# Patient Record
Sex: Female | Born: 1955
Health system: Southern US, Community
[De-identification: ages and names within clinical notes are randomized; demographics above are authoritative.]

## PROBLEM LIST (undated history)

## (undated) DIAGNOSIS — E785 Hyperlipidemia, unspecified: Secondary | ICD-10-CM

## (undated) DIAGNOSIS — M1990 Unspecified osteoarthritis, unspecified site: Secondary | ICD-10-CM

## (undated) DIAGNOSIS — I10 Essential (primary) hypertension: Secondary | ICD-10-CM

## (undated) DIAGNOSIS — K219 Gastro-esophageal reflux disease without esophagitis: Secondary | ICD-10-CM

## (undated) DIAGNOSIS — M545 Low back pain, unspecified: Secondary | ICD-10-CM

## (undated) DIAGNOSIS — G8929 Other chronic pain: Secondary | ICD-10-CM

## (undated) DIAGNOSIS — I341 Nonrheumatic mitral (valve) prolapse: Secondary | ICD-10-CM

## (undated) DIAGNOSIS — R1319 Other dysphagia: Secondary | ICD-10-CM

## (undated) DIAGNOSIS — F10239 Alcohol dependence with withdrawal, unspecified: Secondary | ICD-10-CM

## (undated) DIAGNOSIS — K700 Alcoholic fatty liver: Secondary | ICD-10-CM

## (undated) DIAGNOSIS — E042 Nontoxic multinodular goiter: Secondary | ICD-10-CM

## (undated) DIAGNOSIS — F419 Anxiety disorder, unspecified: Secondary | ICD-10-CM

## (undated) DIAGNOSIS — I471 Supraventricular tachycardia, unspecified: Secondary | ICD-10-CM

## (undated) DIAGNOSIS — G43909 Migraine, unspecified, not intractable, without status migrainosus: Secondary | ICD-10-CM

## (undated) DIAGNOSIS — R112 Nausea with vomiting, unspecified: Secondary | ICD-10-CM

## (undated) DIAGNOSIS — F32A Depression, unspecified: Secondary | ICD-10-CM

## (undated) DIAGNOSIS — F1020 Alcohol dependence, uncomplicated: Secondary | ICD-10-CM

## (undated) DIAGNOSIS — Z780 Asymptomatic menopausal state: Secondary | ICD-10-CM

## (undated) DIAGNOSIS — M810 Age-related osteoporosis without current pathological fracture: Secondary | ICD-10-CM

## (undated) DIAGNOSIS — Z9889 Other specified postprocedural states: Secondary | ICD-10-CM

## (undated) DIAGNOSIS — F329 Major depressive disorder, single episode, unspecified: Secondary | ICD-10-CM

## (undated) HISTORY — DX: Supraventricular tachycardia, unspecified: I47.10

## (undated) HISTORY — DX: Hyperlipidemia, unspecified: E78.5

## (undated) HISTORY — DX: Asymptomatic menopausal state: Z78.0

## (undated) HISTORY — PX: BREAST BIOPSY: SHX20

## (undated) HISTORY — PX: OTHER SURGICAL HISTORY: SHX169

## (undated) HISTORY — PX: TONSILLECTOMY: SUR1361

## (undated) HISTORY — DX: Supraventricular tachycardia: I47.1

## (undated) HISTORY — DX: Anxiety disorder, unspecified: F41.9

## (undated) HISTORY — DX: Other dysphagia: R13.19

## (undated) HISTORY — PX: CATARACT EXTRACTION W/ INTRAOCULAR LENS  IMPLANT, BILATERAL: SHX1307

## (undated) HISTORY — DX: Migraine, unspecified, not intractable, without status migrainosus: G43.909

## (undated) HISTORY — PX: CERVICAL CONE BIOPSY: SUR198

## (undated) HISTORY — DX: Nontoxic multinodular goiter: E04.2

## (undated) HISTORY — DX: Age-related osteoporosis without current pathological fracture: M81.0

---

## 1989-02-28 HISTORY — PX: CERVIX LESION DESTRUCTION: SHX591

## 2005-03-11 ENCOUNTER — Emergency Department (HOSPITAL_COMMUNITY): Admission: EM | Admit: 2005-03-11 | Discharge: 2005-03-11 | Payer: Self-pay | Admitting: *Deleted

## 2005-05-11 ENCOUNTER — Encounter: Admission: RE | Admit: 2005-05-11 | Discharge: 2005-05-11 | Payer: Self-pay | Admitting: *Deleted

## 2005-05-28 ENCOUNTER — Emergency Department (HOSPITAL_COMMUNITY): Admission: EM | Admit: 2005-05-28 | Discharge: 2005-05-29 | Payer: Self-pay | Admitting: Emergency Medicine

## 2005-09-06 ENCOUNTER — Ambulatory Visit: Payer: Self-pay | Admitting: Family Medicine

## 2005-11-19 ENCOUNTER — Emergency Department (HOSPITAL_COMMUNITY): Admission: EM | Admit: 2005-11-19 | Discharge: 2005-11-20 | Payer: Self-pay | Admitting: Emergency Medicine

## 2005-11-23 ENCOUNTER — Ambulatory Visit: Payer: Self-pay | Admitting: Family Medicine

## 2006-01-05 ENCOUNTER — Encounter: Admission: RE | Admit: 2006-01-05 | Discharge: 2006-01-05 | Payer: Self-pay | Admitting: Obstetrics and Gynecology

## 2006-02-07 ENCOUNTER — Ambulatory Visit: Payer: Self-pay | Admitting: Family Medicine

## 2006-02-09 ENCOUNTER — Encounter: Admission: RE | Admit: 2006-02-09 | Discharge: 2006-02-09 | Payer: Self-pay | Admitting: Family Medicine

## 2006-05-18 ENCOUNTER — Encounter: Admission: RE | Admit: 2006-05-18 | Discharge: 2006-05-18 | Payer: Self-pay | Admitting: Obstetrics and Gynecology

## 2006-06-02 ENCOUNTER — Ambulatory Visit: Payer: Self-pay | Admitting: Family Medicine

## 2006-06-07 ENCOUNTER — Encounter: Admission: RE | Admit: 2006-06-07 | Discharge: 2006-06-07 | Payer: Self-pay | Admitting: Family Medicine

## 2006-06-15 ENCOUNTER — Ambulatory Visit: Payer: Self-pay | Admitting: Family Medicine

## 2006-07-04 ENCOUNTER — Ambulatory Visit: Payer: Self-pay | Admitting: Family Medicine

## 2006-08-09 ENCOUNTER — Ambulatory Visit (HOSPITAL_COMMUNITY): Admission: RE | Admit: 2006-08-09 | Discharge: 2006-08-09 | Payer: Self-pay | Admitting: Obstetrics and Gynecology

## 2006-08-09 ENCOUNTER — Encounter (INDEPENDENT_AMBULATORY_CARE_PROVIDER_SITE_OTHER): Payer: Self-pay | Admitting: Obstetrics and Gynecology

## 2006-12-18 ENCOUNTER — Ambulatory Visit: Payer: Self-pay | Admitting: Family Medicine

## 2007-03-26 ENCOUNTER — Ambulatory Visit: Payer: Self-pay | Admitting: Family Medicine

## 2007-04-19 ENCOUNTER — Ambulatory Visit: Payer: Self-pay | Admitting: Family Medicine

## 2007-05-04 ENCOUNTER — Encounter: Admission: RE | Admit: 2007-05-04 | Discharge: 2007-05-04 | Payer: Self-pay | Admitting: Obstetrics and Gynecology

## 2007-06-19 ENCOUNTER — Ambulatory Visit: Payer: Self-pay | Admitting: Gastroenterology

## 2007-06-19 ENCOUNTER — Encounter: Payer: Self-pay | Admitting: Gastroenterology

## 2007-06-19 DIAGNOSIS — K200 Eosinophilic esophagitis: Secondary | ICD-10-CM | POA: Insufficient documentation

## 2007-06-19 DIAGNOSIS — R1319 Other dysphagia: Secondary | ICD-10-CM

## 2007-06-19 HISTORY — DX: Other dysphagia: R13.19

## 2007-06-20 ENCOUNTER — Ambulatory Visit: Payer: Self-pay | Admitting: Family Medicine

## 2007-06-21 ENCOUNTER — Ambulatory Visit: Payer: Self-pay | Admitting: Gastroenterology

## 2007-07-04 ENCOUNTER — Telehealth: Payer: Self-pay | Admitting: Gastroenterology

## 2007-07-05 ENCOUNTER — Encounter: Payer: Self-pay | Admitting: Gastroenterology

## 2007-07-05 ENCOUNTER — Ambulatory Visit: Payer: Self-pay | Admitting: Gastroenterology

## 2007-07-05 LAB — HM COLONOSCOPY: HM Colonoscopy: NORMAL

## 2007-07-09 ENCOUNTER — Encounter: Payer: Self-pay | Admitting: Gastroenterology

## 2007-07-25 ENCOUNTER — Ambulatory Visit (HOSPITAL_COMMUNITY): Admission: RE | Admit: 2007-07-25 | Discharge: 2007-07-25 | Payer: Self-pay | Admitting: Obstetrics and Gynecology

## 2007-07-25 ENCOUNTER — Encounter (INDEPENDENT_AMBULATORY_CARE_PROVIDER_SITE_OTHER): Payer: Self-pay | Admitting: Obstetrics and Gynecology

## 2007-12-06 ENCOUNTER — Ambulatory Visit: Payer: Self-pay | Admitting: Family Medicine

## 2008-02-20 ENCOUNTER — Ambulatory Visit: Payer: Self-pay | Admitting: Family Medicine

## 2008-03-13 ENCOUNTER — Ambulatory Visit: Payer: Self-pay | Admitting: Family Medicine

## 2008-05-05 ENCOUNTER — Encounter: Admission: RE | Admit: 2008-05-05 | Discharge: 2008-05-05 | Payer: Self-pay | Admitting: Obstetrics and Gynecology

## 2008-06-05 ENCOUNTER — Encounter: Admission: RE | Admit: 2008-06-05 | Discharge: 2008-06-05 | Payer: Self-pay | Admitting: Family Medicine

## 2008-06-13 ENCOUNTER — Encounter: Admission: RE | Admit: 2008-06-13 | Discharge: 2008-06-13 | Payer: Self-pay | Admitting: Obstetrics and Gynecology

## 2008-09-15 ENCOUNTER — Ambulatory Visit: Payer: Self-pay | Admitting: Family Medicine

## 2008-10-20 ENCOUNTER — Ambulatory Visit: Payer: Self-pay | Admitting: Family Medicine

## 2008-10-20 ENCOUNTER — Encounter: Payer: Self-pay | Admitting: Endocrinology

## 2008-10-20 ENCOUNTER — Encounter: Payer: Self-pay | Admitting: Gastroenterology

## 2008-10-28 ENCOUNTER — Ambulatory Visit: Payer: Self-pay

## 2009-06-15 ENCOUNTER — Encounter: Admission: RE | Admit: 2009-06-15 | Discharge: 2009-06-15 | Payer: Self-pay | Admitting: Obstetrics and Gynecology

## 2009-07-23 ENCOUNTER — Encounter: Payer: Self-pay | Admitting: Endocrinology

## 2009-07-23 ENCOUNTER — Ambulatory Visit: Payer: Self-pay | Admitting: Family Medicine

## 2009-07-28 ENCOUNTER — Encounter: Admission: RE | Admit: 2009-07-28 | Discharge: 2009-07-28 | Payer: Self-pay | Admitting: Family Medicine

## 2009-07-28 ENCOUNTER — Encounter: Payer: Self-pay | Admitting: Endocrinology

## 2009-08-17 ENCOUNTER — Ambulatory Visit: Payer: Self-pay | Admitting: Endocrinology

## 2009-08-17 DIAGNOSIS — F411 Generalized anxiety disorder: Secondary | ICD-10-CM | POA: Insufficient documentation

## 2009-08-17 DIAGNOSIS — H9190 Unspecified hearing loss, unspecified ear: Secondary | ICD-10-CM | POA: Insufficient documentation

## 2009-08-17 DIAGNOSIS — E042 Nontoxic multinodular goiter: Secondary | ICD-10-CM

## 2009-08-17 DIAGNOSIS — Z780 Asymptomatic menopausal state: Secondary | ICD-10-CM | POA: Insufficient documentation

## 2009-08-17 DIAGNOSIS — G43909 Migraine, unspecified, not intractable, without status migrainosus: Secondary | ICD-10-CM | POA: Insufficient documentation

## 2009-08-17 DIAGNOSIS — M810 Age-related osteoporosis without current pathological fracture: Secondary | ICD-10-CM | POA: Insufficient documentation

## 2009-08-17 HISTORY — DX: Nontoxic multinodular goiter: E04.2

## 2009-08-17 HISTORY — DX: Asymptomatic menopausal state: Z78.0

## 2009-08-17 HISTORY — DX: Age-related osteoporosis without current pathological fracture: M81.0

## 2009-08-17 LAB — CONVERTED CEMR LAB: Pap Smear: NORMAL

## 2009-08-28 ENCOUNTER — Ambulatory Visit: Payer: Self-pay | Admitting: Family Medicine

## 2009-09-04 ENCOUNTER — Encounter: Admission: RE | Admit: 2009-09-04 | Discharge: 2009-09-04 | Payer: Self-pay | Admitting: Obstetrics and Gynecology

## 2009-11-23 ENCOUNTER — Ambulatory Visit: Payer: Self-pay | Admitting: Family Medicine

## 2009-12-30 ENCOUNTER — Telehealth: Payer: Self-pay | Admitting: Gastroenterology

## 2009-12-31 ENCOUNTER — Encounter: Payer: Self-pay | Admitting: Gastroenterology

## 2010-01-07 ENCOUNTER — Encounter: Payer: Self-pay | Admitting: Gastroenterology

## 2010-03-21 ENCOUNTER — Encounter: Payer: Self-pay | Admitting: Obstetrics and Gynecology

## 2010-03-30 NOTE — Assessment & Plan Note (Signed)
Summary: new endo,enlarged thyroid,?goiter/bcbs/#/cd   Vital Signs:  Patient profile:   55 year old female Menstrual status:  postmenopausal LMP:     02/28/2005 Height:      62 inches Weight:      118 pounds BMI:     21.66 O2 Sat:      96 % on Room air Temp:     98.1 degrees F oral Pulse rate:   108 / minute BP sitting:   122 / 76  (left arm) Cuff size:   regular  Vitals Entered By: Margaret Pyle, CMA (August 17, 2009 2:21 PM)  O2 Flow:  Room air CC: New Endo - Enlarged thyroid? LMP (date): 02/28/2005     Menstrual Status postmenopausal Enter LMP: 02/28/2005 Last PAP Date 05/29/2009 Last PAP Result Normal   Primary Provider:  Toniann Fail  CC:  New Endo - Enlarged thyroid?.  History of Present Illness: pt was recently noted on screening to have carotid athersclerosis.  on subsequent neck physical examination, she was noted to have a goiter.   pt states she has many years of moderate intermittent right parietal headache, but no assoc neck pain.    Allergies: 1)  ! Tetracycline Hcl (Tetracycline Hcl) 2)  ! Doxycycline  Family History: Reviewed history and no changes required. 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         denies weight loss, hoarseness, double vision, sob, diarrhea, polyuria, myalgias, excessive diaphoresis, numbness, tremor, easy bruising, and rhinorrhea.  she reports intermittent palpitations, menopausal sxs, and anxiety.  the menopausal sxs are improved with hrt   Physical Exam  Neck:  thyroid is slightly enlarged with irregular surface, but i do not appreciate any nodule Lungs:  Clear to auscultation bilaterally. Normal respiratory effort.  Heart:  Regular rate and rhythm without murmurs or gallops noted. Normal S1,S2.   Msk:  muscle bulk and strength are grossly normal.  no obvious joint swelling.  gait is normal  and steady  Extremities:  no deofrmity no edema Neurologic:  cn 2-12 grossly intact, except for decrased hearing.   readily moves all 4's.   there is a slight tremor of the hands  Skin:  slightly diaphoretic Cervical Nodes:  No significant adenopathy.  Psych:  Alert and cooperative; normal mood and affect; normal attention span and concentration.   Additional Exam:  THYROID ULTRASOUND  Inhomogeneous thyroid with the largest solid nodule measuring 10  mm in maximum diameter on the left.  tsh=normal   Impression & Recommendations:  Problem # 1:  GOITER, MULTINODULAR (ICD-241.1) Assessment New euthyroid  Problem # 2:  ANXIETY (ICD-300.00) not related to #1  Problem # 3:  MIGRAINE HEADACHE (ICD-346.90) not thyroid-related  Problem # 4:  ASYMPTOMATIC POSTMENOPAUSAL STATUS (ICD-V49.81) this can mimic thyroid sxs, but she has no sxs now.  Medications Added to Medication List This Visit: 1)  Femhrt 1/5 1-5 Mg-mcg Tabs (Norethindrone-eth estradiol) .[name] Kitchen.. 1 by mouth qd 2)  Imitrex   Other Orders: Consultation Level IV (16109)  Patient Instructions: 1)  most of the time, a "lumpy thyroid" will eventually become overactive.  this is usually a slow process, happening over the span of many years. 2)  you should have a repeat thyroid ultrasound in 1 year. 3)  indefinitely, you should have a physical examination of the thyroid and a thyroid blood test.  i would be happy to  do this, or Dailyn fleming could do so.  at any rate, you should return here if your blood test becomes abnormal.   Immunization History:  Tetanus/Td Immunization History:    Tetanus/Td:  historical (02/28/2006)

## 2010-03-30 NOTE — Medication Information (Signed)
Summary: Prior autho for Protonix/BCBS  Prior autho for Protonix/BCBS   Imported By: Sherian Rein 01/07/2010 10:42:38  _____________________________________________________________________  External Attachment:    Type:   Image     Comment:   External Document

## 2010-03-30 NOTE — Progress Notes (Signed)
Summary: Refill  Phone Note Call from Patient   Call For: Dr Arlyce Dice Reason for Call: Refill Medication Summary of Call: Pantoprazole [facility] will do a 69month supply for $10 instead of $20 at her local pharmacy. Can we fax them a script. She doesnt require a call back unless we are not going to give her the refill. Initial call taken by: Leanor Kail [facility],  December 30, 2009 4:00 PM  Follow-up for Phone Call        Sent 90 days supply to [facility] for pt Follow-up by: Merri Ray CMA Duncan Dull),  December 30, 2009 4:06 PM    New/Updated Medications: PROTONIX 40 MG TBEC (PANTOPRAZOLE SODIUM) 1 by mouth once daily Prescriptions: PROTONIX 40 MG TBEC (PANTOPRAZOLE SODIUM) 1 by mouth once daily  #90 x 4   Entered by:   Merri Ray CMA (AAMA)   Authorized by:   Louis Meckel MD   Signed by:   Merri Ray CMA (AAMA) on 12/30/2009   Method used:   Faxed to ...       [facility] (mail-order)             , [place]         Ph: [phone]       Fax: [phone]   RxID:   [id number]   Appended Document: Refill Had to do prior authorization with insuracne for Protonix pt approved 01/05/2010 for as long as pt is on plan

## 2010-03-30 NOTE — Letter (Signed)
Summary: Sharlot Gowda MD  Sharlot Gowda MD   Imported By: Sherian Rein 08/19/2009 08:34:23  _____________________________________________________________________  External Attachment:    Type:   Image     Comment:   External Document

## 2010-03-30 NOTE — Medication Information (Signed)
Summary: Approved/BCBS  Approved/BCBS   Imported By: Lester Brewton 01/13/2010 10:08:23  _____________________________________________________________________  External Attachment:    Type:   Image     Comment:   External Document

## 2010-06-16 ENCOUNTER — Other Ambulatory Visit: Payer: Self-pay | Admitting: Obstetrics and Gynecology

## 2010-06-16 DIAGNOSIS — Z1231 Encounter for screening mammogram for malignant neoplasm of breast: Secondary | ICD-10-CM

## 2010-06-28 ENCOUNTER — Ambulatory Visit
Admission: RE | Admit: 2010-06-28 | Discharge: 2010-06-28 | Disposition: A | Discharge status: Home / Self Care | Payer: Federal, State, Local not specified - PPO | Source: Ambulatory Visit | Attending: Obstetrics and Gynecology | Admitting: Obstetrics and Gynecology

## 2010-06-28 DIAGNOSIS — Z1231 Encounter for screening mammogram for malignant neoplasm of breast: Secondary | ICD-10-CM

## 2010-07-13 NOTE — Op Note (Signed)
NAME:  Terri Ayala, MACADAM NO.:  [id number]   MEDICAL RECORD NO.:  [id number]          PATIENT TYPE:  AMB   LOCATION:  SDC                           FACILITY:  [facility]   PHYSICIAN:  Carrington Clamp, M.D. DATE OF BIRTH:  October 08, 1955   DATE OF PROCEDURE:  07/25/2007  DATE OF DISCHARGE:                               OPERATIVE REPORT   PREOPERATIVE DIAGNOSIS:  Postmenopausal bleeding.   POSTOPERATIVE DIAGNOSIS:  Postmenopausal bleeding.   PROCEDURE:  Dilation and curettage with hysteroscopy.   SURGEON:  Carrington Clamp, MD   ASSISTANT:  None.   ANESTHESIA:  General LMA.   FINDINGS:  Normal uterus without polyps.   SPECIMENS:  Endometrial curettings.   DISPOSITION:  To pathology.   FINDINGS:  None.   ESTIMATED BLOOD LOSS:  Minimal.   IV FLUIDS:  normal amt   URINE OUTPUT:  Not measured.   COMPLICATIONS:  None.   MEDICATIONS:  None.   COUNTS:  Correct x3.   TECHNIQUE:  After adequate general anesthesia was achieved, the patient  was prepped and draped in usual sterile fashion in dorsal lithotomy  position.  Bladder was emptied with the catheter and the speculum was  placed in the vagina.  Single-tooth tenaculum was used to grasp the  cervix and the cervix was dilated carefully with Shawnie Pons dilators.  The hysteroscope was passed inside the cavity and noted no submucosal  fibroids or polyps or any other lesions.  The endometrial curettings  were performed.  The scope was replaced and the same findings were  noted.  Everything was then withdrawn from the vagina and the uterus and  the patient returned to recovery room in stable condition.      Carrington Clamp, M.D.  Electronically Signed     MH/MEDQ  D:  07/25/2007  T:  07/26/2007  Job:  [id number]

## 2010-07-16 NOTE — Op Note (Signed)
NAME:  Terri Ayala, Terri Ayala NO.:  [id number]   MEDICAL RECORD NO.:  [id number]          PATIENT TYPE:  AMB   LOCATION:  SDC                           [facility]ILITY:  [facility]   PHYSICIAN:  Carrington Clamp, M.D. DATE OF BIRTH:  [birth year]   DATE OF PROCEDURE:  08/09/2006  DATE OF DISCHARGE:                               OPERATIVE REPORT   PREOPERATIVE DIAGNOSIS:  Vulvar mole with irregular borders and  pigmentation.   POSTOPERATIVE DIAGNOSIS:  Vulvar mole with irregular borders and  pigmentation.   PROCEDURE:  Wide local excision of the mole.   SURGEON:  Loney Laurence, M.D.   ASSISTANT:  None.   ANESTHESIA:  General.   SPECIMENS:  Vulvar wide local excision.   ESTIMATED BLOOD LOSS:  Minimal.   IV FLUIDS:  500 mL.   URINE OUTPUT:  Not measured.   COMPLICATIONS:  None.   FINDINGS:  1x1.5 irregular mole just right at the midline in the mid-  perineum.   MEDICATIONS:  1% lidocaine.   TECHNIQUE:  After adequate general anesthesia was achieved, the patient  was prepped and draped in the usual sterile fashion, in the dorsal  lithotomy position.  The bladder was emptied with a catheter and a  marker was used to mark the vulvar mole site with approximately 0.5 to 1  cm clearance around the vulvar mole in an elliptical fashion.  A scalpel  was used to excise this ellipse carefully with the points of the ellipse  pointing towards the anterior.  This was then removed with the scalpel  and marked with a stitch at 12 o'clock and sent to pathology.  The  subcutaneous tissue was fairly hemostatic with some small bleeders that  were taken care of with interrupted stitches of 4-0 Vicryl  closing the subcutaneous tissue in order to take off some of the tension  on the skin.  The skin was undermined with the scalpel and then closed  with multiple through and through interrupted stitches of 4-0 Vicryl.  The patient tolerated the procedure well and was returned to the  recovery room in stable condition.           ______________________________  Carrington Clamp, M.D.     MH/MEDQ  D:  08/09/2006  T:  08/09/2006  Job:  [id number]

## 2010-08-03 ENCOUNTER — Telehealth: Payer: Self-pay | Admitting: Family Medicine

## 2010-08-03 MED ORDER — ALPRAZOLAM 0.5 MG PO TABS: 0.5000 mg | ORAL_TABLET | ORAL | Status: DC

## 2010-08-03 NOTE — Telephone Encounter (Signed)
Xanax called in 

## 2010-08-10 ENCOUNTER — Telehealth: Payer: Self-pay | Admitting: Family Medicine

## 2010-08-10 NOTE — Telephone Encounter (Signed)
This was called in 08/10/10

## 2010-08-10 NOTE — Telephone Encounter (Signed)
Pt states she called previously for xanax refill. She spoke to [facility] late yesterday and they have no record of xanax rx

## 2010-08-26 ENCOUNTER — Telehealth: Payer: Self-pay | Admitting: Family Medicine

## 2010-08-26 MED ORDER — SUMATRIPTAN SUCCINATE 100 MG PO TABS: 100.0000 mg | ORAL_TABLET | ORAL | Status: DC | PRN

## 2010-08-26 NOTE — Telephone Encounter (Signed)
Needs refill generic Imitrex 100mg  gets 90 day supply sent to [facility]  (gets quantitiy of 36)

## 2010-08-26 NOTE — Telephone Encounter (Signed)
Sent electronically 

## 2010-08-30 ENCOUNTER — Encounter: Payer: Self-pay | Admitting: Family Medicine

## 2010-09-15 ENCOUNTER — Other Ambulatory Visit: Payer: Self-pay

## 2010-09-15 MED ORDER — SIMVASTATIN 20 MG PO TABS: 20.0000 mg | ORAL_TABLET | Freq: Every day | ORAL | Status: DC

## 2010-09-21 ENCOUNTER — Other Ambulatory Visit: Payer: Self-pay | Admitting: Obstetrics and Gynecology

## 2010-09-24 ENCOUNTER — Telehealth: Payer: Self-pay

## 2010-09-24 NOTE — Telephone Encounter (Signed)
You should have an examination of the thyroid, ultrasound, and blood test.  i would be happy to do here, or you could do with Terri Ayala. pa

## 2010-09-24 NOTE — Telephone Encounter (Signed)
Pt called requesting referral to have yearly thyroid U/S to evaluate multinodular goiter.

## 2010-09-27 NOTE — Telephone Encounter (Signed)
Pt advised and will call back to schedule follow up with SAE.

## 2010-09-30 ENCOUNTER — Encounter: Payer: Self-pay | Admitting: Family Medicine

## 2010-10-04 ENCOUNTER — Encounter: Payer: Self-pay | Admitting: Family Medicine

## 2010-10-04 ENCOUNTER — Ambulatory Visit (INDEPENDENT_AMBULATORY_CARE_PROVIDER_SITE_OTHER): Payer: Federal, State, Local not specified - PPO | Admitting: Family Medicine

## 2010-10-04 VITALS — BP 130/90 | HR 90 | Wt 123.0 lb

## 2010-10-04 DIAGNOSIS — E785 Hyperlipidemia, unspecified: Secondary | ICD-10-CM

## 2010-10-04 DIAGNOSIS — H9190 Unspecified hearing loss, unspecified ear: Secondary | ICD-10-CM

## 2010-10-04 DIAGNOSIS — Z79899 Other long term (current) drug therapy: Secondary | ICD-10-CM

## 2010-10-04 DIAGNOSIS — E042 Nontoxic multinodular goiter: Secondary | ICD-10-CM

## 2010-10-04 LAB — CBC WITH DIFFERENTIAL/PLATELET
Basophils Relative: 1 % (ref 0–1)
Eosinophils Absolute: 0.1 10*3/uL (ref 0.0–0.7)
Eosinophils Relative: 1 % (ref 0–5)
HCT: 37.9 % (ref 36.0–46.0)
Hemoglobin: 12.9 g/dL (ref 12.0–15.0)
MCH: 34.1 pg — ABNORMAL HIGH (ref 26.0–34.0)
MCHC: 34 g/dL (ref 30.0–36.0)
MCV: 100.3 fL — ABNORMAL HIGH (ref 78.0–100.0)
Monocytes Absolute: 0.5 10*3/uL (ref 0.1–1.0)
Monocytes Relative: 8 % (ref 3–12)
Neutro Abs: 3.9 10*3/uL (ref 1.7–7.7)

## 2010-10-04 LAB — COMPREHENSIVE METABOLIC PANEL
Albumin: 4.5 g/dL (ref 3.5–5.2)
Alkaline Phosphatase: 69 U/L (ref 39–117)
BUN: 8 mg/dL (ref 6–23)
Glucose, Bld: 87 mg/dL (ref 70–99)
Potassium: 4.2 mEq/L (ref 3.5–5.3)

## 2010-10-04 LAB — T3: T3, Total: 112.8 ng/dL (ref 80.0–204.0)

## 2010-10-04 LAB — LIPID PANEL
HDL: 91 mg/dL (ref >39)
LDL Cholesterol: 109 mg/dL — ABNORMAL HIGH (ref 0–99)
Triglycerides: 58 mg/dL (ref <150)

## 2010-10-04 LAB — T4: T4, Total: 8.4 ug/dL (ref 5.0–12.5)

## 2010-10-04 NOTE — Progress Notes (Signed)
  Subjective:    Patient ID: Terri Ayala, female    DOB: 1955-06-16, 55 y.o.   MRN: [id number]  HPI She is here for an interval evaluation. She has a previous history of multinodular goiter and needs followup blood work. She has had no skin or hair changes. No hot or cold intolerance. She does have difficulty with her hearing and would like a referral. She continues on other medications listed in the chart.  Review of Systems Negative except as above    Objective:   Physical Exam alert and in no distress. Tympanic membranes and canals are normal. Throat is clear. Tonsils are normal. Neck is supple without adenopathy or thyromegaly. Cardiac exam shows a regular sinus rhythm without murmurs or gallops. Lungs are clear to auscultation. Skin is normal. DTRs are  Normal. Hearing evaluation showed bilateral loss.       Assessment & Plan:  Multinodular goiter.. Loss. Hyperlipidemia. Routine blood screening including thyroid ultrasound. Refer to hearing clinic.

## 2010-10-06 ENCOUNTER — Ambulatory Visit
Admission: RE | Admit: 2010-10-06 | Discharge: 2010-10-06 | Disposition: A | Discharge status: Home / Self Care | Payer: Federal, State, Local not specified - PPO | Source: Ambulatory Visit | Attending: Family Medicine | Admitting: Family Medicine

## 2010-10-06 DIAGNOSIS — E042 Nontoxic multinodular goiter: Secondary | ICD-10-CM

## 2010-10-07 ENCOUNTER — Telehealth: Payer: Self-pay

## 2010-10-07 NOTE — Telephone Encounter (Signed)
Left word for pt to call me back

## 2010-10-08 NOTE — Telephone Encounter (Signed)
Pt called advised labs looked good, awaiting [place] report

## 2010-10-11 ENCOUNTER — Telehealth: Payer: Self-pay

## 2010-10-11 NOTE — Telephone Encounter (Signed)
Left message for pt to call me back 

## 2010-10-12 NOTE — Telephone Encounter (Signed)
Pt informed of u/s and labs

## 2010-10-18 NOTE — Progress Notes (Signed)
Addended by: Lavell Islam on: 10/18/2010 11:36 AM   Modules accepted: Orders

## 2010-11-02 ENCOUNTER — Telehealth: Payer: Self-pay | Admitting: Family Medicine

## 2010-11-02 MED ORDER — ALPRAZOLAM 0.5 MG PO TABS: 0.5000 mg | ORAL_TABLET | Freq: Every evening | ORAL | Status: DC | PRN

## 2010-11-02 NOTE — Telephone Encounter (Signed)
Xanax will be called in for a 90 day supply. I will reevaluate her use in 3 months

## 2010-11-03 ENCOUNTER — Telehealth: Payer: Self-pay | Admitting: Family Medicine

## 2010-11-03 MED ORDER — SUMATRIPTAN SUCCINATE 100 MG PO TABS: 100.0000 mg | ORAL_TABLET | ORAL | Status: DC | PRN

## 2010-11-03 NOTE — Telephone Encounter (Signed)
Imitrex renewed 

## 2010-11-09 ENCOUNTER — Telehealth: Payer: Self-pay

## 2010-11-09 NOTE — Telephone Encounter (Signed)
Called Vanesa to let her  Know u/s and labs were sent

## 2010-11-10 ENCOUNTER — Encounter: Payer: Self-pay | Admitting: *Deleted

## 2010-11-14 ENCOUNTER — Emergency Department (HOSPITAL_COMMUNITY): Payer: Federal, State, Local not specified - PPO

## 2010-11-14 ENCOUNTER — Emergency Department (HOSPITAL_COMMUNITY)
Admission: EM | Admit: 2010-11-14 | Discharge: 2010-11-15 | Disposition: A | Discharge status: Home / Self Care | Payer: Federal, State, Local not specified - PPO | Attending: Emergency Medicine | Admitting: Emergency Medicine

## 2010-11-14 DIAGNOSIS — I059 Rheumatic mitral valve disease, unspecified: Secondary | ICD-10-CM | POA: Insufficient documentation

## 2010-11-14 DIAGNOSIS — F411 Generalized anxiety disorder: Secondary | ICD-10-CM | POA: Insufficient documentation

## 2010-11-14 DIAGNOSIS — R002 Palpitations: Secondary | ICD-10-CM | POA: Insufficient documentation

## 2010-11-14 DIAGNOSIS — R61 Generalized hyperhidrosis: Secondary | ICD-10-CM | POA: Insufficient documentation

## 2010-11-14 DIAGNOSIS — R0602 Shortness of breath: Secondary | ICD-10-CM | POA: Insufficient documentation

## 2010-11-14 DIAGNOSIS — R42 Dizziness and giddiness: Secondary | ICD-10-CM | POA: Insufficient documentation

## 2010-11-14 DIAGNOSIS — R110 Nausea: Secondary | ICD-10-CM | POA: Insufficient documentation

## 2010-11-14 DIAGNOSIS — I498 Other specified cardiac arrhythmias: Secondary | ICD-10-CM | POA: Insufficient documentation

## 2010-11-14 LAB — POCT I-STAT TROPONIN I: Troponin i, poc: 0.02 ng/mL (ref 0.00–0.08)

## 2010-11-14 LAB — DIFFERENTIAL
Basophils Absolute: 0.1 10*3/uL (ref 0.0–0.1)
Basophils Relative: 1 % (ref 0–1)
Eosinophils Absolute: 0.1 10*3/uL (ref 0.0–0.7)
Eosinophils Relative: 1 % (ref 0–5)
Lymphocytes Relative: 22 % (ref 12–46)
Lymphs Abs: 1.6 10*3/uL (ref 0.7–4.0)
Monocytes Absolute: 0.5 10*3/uL (ref 0.1–1.0)
Monocytes Relative: 7 % (ref 3–12)
Neutro Abs: 5.2 10*3/uL (ref 1.7–7.7)
Neutrophils Relative %: 70 % (ref 43–77)

## 2010-11-14 LAB — CBC
HCT: 42 % (ref 36.0–46.0)
Hemoglobin: 14.4 g/dL (ref 12.0–15.0)
MCH: 34.7 pg — ABNORMAL HIGH (ref 26.0–34.0)
MCHC: 34.3 g/dL (ref 30.0–36.0)
MCV: 101.2 fL — ABNORMAL HIGH (ref 78.0–100.0)
Platelets: 171 10*3/uL (ref 150–400)
RBC: 4.15 MIL/uL (ref 3.87–5.11)
RDW: 12.5 % (ref 11.5–15.5)
WBC: 7.4 10*3/uL (ref 4.0–10.5)

## 2010-11-14 LAB — COMPREHENSIVE METABOLIC PANEL
ALT: 34 U/L (ref 0–35)
AST: 180 U/L — ABNORMAL HIGH (ref 0–37)
Albumin: 4.4 g/dL (ref 3.5–5.2)
Alkaline Phosphatase: 77 U/L (ref 39–117)
Chloride: 97 mEq/L (ref 96–112)
Potassium: 4 mEq/L (ref 3.5–5.1)
Sodium: 137 mEq/L (ref 135–145)
Total Protein: 7.7 g/dL (ref 6.0–8.3)

## 2010-11-15 ENCOUNTER — Ambulatory Visit (INDEPENDENT_AMBULATORY_CARE_PROVIDER_SITE_OTHER): Payer: Federal, State, Local not specified - PPO | Admitting: Endocrinology

## 2010-11-15 ENCOUNTER — Encounter: Payer: Self-pay | Admitting: Endocrinology

## 2010-11-15 ENCOUNTER — Other Ambulatory Visit (INDEPENDENT_AMBULATORY_CARE_PROVIDER_SITE_OTHER): Payer: Federal, State, Local not specified - PPO

## 2010-11-15 VITALS — BP 102/70 | HR 95 | Temp 99.2°F | Ht 62.0 in | Wt 125.0 lb

## 2010-11-15 DIAGNOSIS — R6882 Decreased libido: Secondary | ICD-10-CM

## 2010-11-15 LAB — POCT I-STAT TROPONIN I: Troponin i, poc: 0.03 ng/mL (ref 0.00–0.08)

## 2010-11-15 NOTE — Patient Instructions (Addendum)
No treatment is needed for the thyroid now.  However, most of the time, a "lumpy thyroid" will eventually become overactive.  this is usually a slow process, happening over the span of many years. Please return in 1 year. The fast heartbeat is not related to the thyroid.   Please see the heart doctor as scheduled.   blood tests are being requested for you today.  please call [phone] to hear your test results.  You will be prompted to enter the 9-digit "MRN" number that appears at the top left of this page, followed by #.  Then you will hear the message. (update: i left message on phone-tree:  Please follow the advice of dr Henderson Cloud).

## 2010-11-15 NOTE — Progress Notes (Signed)
Subjective:    Patient ID: Terri Ayala, female    DOB: [birth year], 55 y.o.   MRN: [id number]  HPI Pt returns for f/u of few years of a slight multinodular goiter in the neck.  No assoc hyperthyroidism Pt was seen in er last night with svt.  she was also seen there with same problem in approx 2007.  She feels better on tenormin now.   She reports decreased libido.  Dr Henderson Cloud rx'ed topical testosterone.  Pt wants her testosterone level checked prior to starting the med.  i told pt this is not necessary in a menopausal woman, but she wants to check anyway.   Past Medical History  Diagnosis Date  . Low back pain   . Anxiety   . Migraines   . GOITER, MULTINODULAR 08/17/2009  . OSTEOPOROSIS 08/17/2009  . ASYMPTOMATIC POSTMENOPAUSAL STATUS 08/17/2009  . DYSPHAGIA 06/19/2007  . Supraventricular tachycardia     No past surgical history on file.  History   Social History  . Marital Status: Single    Spouse Name: N/A    Number of Children: N/A  . Years of Education: N/A   Occupational History  . Biologist    Social History Main Topics  . Smoking status: Never Smoker   . Smokeless tobacco: Never Used  . Alcohol Use: Not on file  . Drug Use: Not on file  . Sexually Active: Not on file   Other Topics Concern  . Not on file   Social History Narrative  . No narrative on file    Current Outpatient Prescriptions on File Prior to Visit  Medication Sig Dispense Refill  . alendronate (FOSAMAX) 70 MG tablet Take 70 mg by mouth every 7 (seven) days. Take with a full glass of water on an empty stomach.       . ALPRAZolam (XANAX) 0.5 MG tablet Take 1 tablet (0.5 mg total) by mouth at bedtime as needed for sleep.  90 tablet  0  . norethindrone-ethinyl estradiol (FEMHRT 1/5) 1-5 MG-MCG TABS Take by mouth daily.        . pantoprazole (PROTONIX) 40 MG tablet Take 40 mg by mouth daily.        . simvastatin (ZOCOR) 20 MG tablet Take 1 tablet (20 mg total) by mouth at bedtime.  90 tablet  0  .  SUMAtriptan (IMITREX) 100 MG tablet Take 1 tablet (100 mg total) by mouth every 2 (two) hours as needed for migraine. Maximum 300mg /24 hours  36 tablet  1    Allergies  Allergen Reactions  . Doxycycline   . Tetracycline Hcl     Family History  Problem Relation Age of Onset  . Thyroid disease Neg Hx   . Goiter Neg Hx    BP 102/70  Pulse 95  Temp(Src) 99.2 F (37.3 C) (Oral)  Ht 5\' 2"  (1.575 m)  Wt 125 lb (56.7 kg)  BMI 22.86 kg/m2  SpO2 97%  Review of Systems Denies loc.  She says menopausal sxs are well-controlled, on femhrt.      Objective:   Physical Exam VITAL SIGNS:  See vs page GENERAL: no distress Neck: thyroid has an irreg. surface, but i do not appreciate a discrete nodule.    Lab Results  Component Value Date   TSH 3.051 10/04/2010   T3TOTAL 112.8 10/04/2010   T4TOTAL 8.4 10/04/2010  thyroid ultrasound: unchanged    Assessment & Plan:  Multinodular goiter.  No nodule is big enough to warrant needle bx.  SVT, not thyroid-related     Decreased libido, uncertain etiology

## 2010-11-24 LAB — URINALYSIS, ROUTINE W REFLEX MICROSCOPIC
Glucose, UA: NEGATIVE
Protein, ur: NEGATIVE
Specific Gravity, Urine: <1.005 — ABNORMAL LOW
pH: 7

## 2010-11-24 LAB — CBC
HCT: 39.9
Platelets: 209
RBC: 4.12
WBC: 6.3

## 2010-11-25 ENCOUNTER — Telehealth: Payer: Self-pay | Admitting: Family Medicine

## 2010-11-25 NOTE — Telephone Encounter (Signed)
DONE

## 2010-11-29 ENCOUNTER — Encounter: Payer: Self-pay | Admitting: Cardiovascular Disease

## 2010-11-29 ENCOUNTER — Ambulatory Visit (INDEPENDENT_AMBULATORY_CARE_PROVIDER_SITE_OTHER): Payer: Federal, State, Local not specified - PPO | Admitting: Cardiovascular Disease

## 2010-11-29 VITALS — BP 146/78 | HR 108 | Ht 62.0 in | Wt 122.8 lb

## 2010-11-29 DIAGNOSIS — I059 Rheumatic mitral valve disease, unspecified: Secondary | ICD-10-CM

## 2010-11-29 DIAGNOSIS — R002 Palpitations: Secondary | ICD-10-CM | POA: Insufficient documentation

## 2010-11-29 DIAGNOSIS — I471 Supraventricular tachycardia, unspecified: Secondary | ICD-10-CM | POA: Insufficient documentation

## 2010-11-29 DIAGNOSIS — I498 Other specified cardiac arrhythmias: Secondary | ICD-10-CM

## 2010-11-29 DIAGNOSIS — I341 Nonrheumatic mitral (valve) prolapse: Secondary | ICD-10-CM | POA: Insufficient documentation

## 2010-11-29 MED ORDER — DILTIAZEM HCL ER COATED BEADS 120 MG PO CP24: 120.0000 mg | ORAL_CAPSULE | Freq: Every day | ORAL | Status: DC

## 2010-11-29 NOTE — Patient Instructions (Signed)
Your physician recommends that you schedule a follow-up appointment in: 4-5 weeks (after 21 day event monitor completed)  Your physician has recommended that you wear an event monitor. Event monitors are medical devices that record the heart's electrical activity. Doctors most often [place] these monitors to diagnose arrhythmias. Arrhythmias are problems with the speed or rhythm of the heartbeat. The monitor is a small, portable device. You can wear one while you do your normal daily activities. This is usually used to diagnose what is causing palpitations/syncope (passing out).   Your physician has requested that you have an echocardiogram. Echocardiography is a painless test that uses sound waves to create images of your heart. It provides your doctor with information about the size and shape of your heart and how well your heart's chambers and valves are working. This procedure takes approximately one hour. There are no restrictions for this procedure.   Your physician has recommended you make the following change in your medication: Start Cardizem CD 120 mg by mouth daily.

## 2010-11-29 NOTE — Assessment & Plan Note (Signed)
SVT documented in ED. She did not tolerate beta blockade. Will start Cardizem CD 120 mg po QDaily. Will get echo to exclude structural heart disease and assess LVEF. Will have her wear a 21 day event monitor to see frequency and type of arrythmias. Thryoid has been followed closely by her other doctor and has been ok.

## 2010-11-29 NOTE — Assessment & Plan Note (Signed)
Will check echo to assess.

## 2010-11-29 NOTE — Progress Notes (Signed)
History of Present Illness:55 yo female with history of anxiety, migraine headaches, mitral valve prolapse here today as a new patient for cardiac evaluation. She was seen in the [facility] ED 11/14/10 with c/o palpitations. EKG with SVT with rate of 178 bpm. Adenosine 6 mg was given IV x 1 and she converted to normal sinus rhythm. Dr. Donnie Aho was on call and recommended atenolol 25 mg po once daily and f/u in [facility].   She tells me that she felt her heart racing that day. She felt chest pressure, nausea. This resolved when she converted to sinus rhythm. She did well for the next two weeks but she did not tolerate the atenolol because of fatigue. Last night when she noticed palpitations/heart racing. She had a hard time sleeping. Her HR was elevated but did not seem to be as bad as the other episode. She is feeling better today.   Past Medical History  Diagnosis Date  . Low back pain   . Anxiety   . Migraines   . GOITER, MULTINODULAR 08/17/2009  . OSTEOPOROSIS 08/17/2009  . ASYMPTOMATIC POSTMENOPAUSAL STATUS 08/17/2009  . DYSPHAGIA 06/19/2007  . Supraventricular tachycardia   . Hyperlipidemia     Past Surgical History  Procedure Date  . Laporoscopic abdominal surgery   . Cervix surgery     Current Outpatient Prescriptions  Medication Sig Dispense Refill  . alendronate (FOSAMAX) 70 MG tablet Take 70 mg by mouth every 7 (seven) days. Take with a full glass of water on an empty stomach.       . ALPRAZolam (XANAX) 0.5 MG tablet Take 1 tablet (0.5 mg total) by mouth at bedtime as needed for sleep.  90 tablet  0  . aspirin 81 MG tablet Take 81 mg by mouth daily.        . calcium carbonate 200 MG capsule Take 250 mg by mouth daily.        . cholecalciferol (VITAMIN D) 1000 UNITS tablet Take 1,000 Units by mouth daily.        [name] Kitchen glucosamine-chondroitin 500-400 MG tablet Take 1 tablet by mouth daily.        . norethindrone-ethinyl estradiol (FEMHRT 1/5) 1-5 MG-MCG TABS Take by mouth daily.         . pantoprazole (PROTONIX) 40 MG tablet Take 40 mg by mouth daily.        . simvastatin (ZOCOR) 20 MG tablet Take 1 tablet (20 mg total) by mouth at bedtime.  90 tablet  0  . SUMAtriptan (IMITREX) 100 MG tablet Take 1 tablet (100 mg total) by mouth every 2 (two) hours as needed for migraine. Maximum 300mg /24 hours  36 tablet  1    Allergies  Allergen Reactions  . Doxycycline   . Tetracycline Hcl     History   Social History  . Marital Status: Single    Spouse Name: N/A    Number of Children: N/A  . Years of Education: N/A   Occupational History  . Biologist    Social History Main Topics  . Smoking status: Never Smoker   . Smokeless tobacco: Never Used  . Alcohol Use: Not on file  . Drug Use: Not on file  . Sexually Active: Not on file   Other Topics Concern  . Not on file   Social History Narrative  . No narrative on file    Family History  Problem Relation Age of Onset  . Thyroid disease Neg Hx   . Goiter Neg  Hx     Review of Systems:  As stated in the HPI and otherwise negative.   BP 146/78  Pulse 108  Ht 5\' 2"  (1.575 m)  Wt 122 lb 12.8 oz (55.702 kg)  BMI 22.46 kg/m2  Physical Examination: General: Well developed, well nourished, NAD HEENT: OP clear, mucus membranes moist SKIN: warm, dry. No rashes. Neuro: No focal deficits Musculoskeletal: Muscle strength 5/5 all ext Psychiatric: Mood and affect normal Neck: No JVD, no carotid bruits, no thyromegaly, no lymphadenopathy. Lungs:Clear bilaterally, no wheezes, rhonci, crackles Cardiovascular: Tachy, regular. No murmurs, gallops or rubs. Abdomen:Soft. Bowel sounds present. Non-tender.  Extremities: No lower extremity edema. Pulses are 2 + in the bilateral DP/PT.  ZOX:WRUEA tach, rate 108 bpm.

## 2010-12-07 ENCOUNTER — Ambulatory Visit (HOSPITAL_COMMUNITY): Payer: Federal, State, Local not specified - PPO | Attending: Cardiovascular Disease

## 2010-12-07 ENCOUNTER — Encounter (INDEPENDENT_AMBULATORY_CARE_PROVIDER_SITE_OTHER): Payer: Federal, State, Local not specified - PPO

## 2010-12-07 DIAGNOSIS — F411 Generalized anxiety disorder: Secondary | ICD-10-CM | POA: Insufficient documentation

## 2010-12-07 DIAGNOSIS — I471 Supraventricular tachycardia, unspecified: Secondary | ICD-10-CM | POA: Insufficient documentation

## 2010-12-07 DIAGNOSIS — R000 Tachycardia, unspecified: Secondary | ICD-10-CM

## 2010-12-07 DIAGNOSIS — I341 Nonrheumatic mitral (valve) prolapse: Secondary | ICD-10-CM

## 2010-12-07 DIAGNOSIS — G43909 Migraine, unspecified, not intractable, without status migrainosus: Secondary | ICD-10-CM | POA: Insufficient documentation

## 2010-12-16 ENCOUNTER — Telehealth: Payer: Self-pay | Admitting: Cardiovascular Disease

## 2010-12-16 LAB — DIFFERENTIAL
Basophils Absolute: 0
Eosinophils Relative: 5
Lymphocytes Relative: 24
Lymphs Abs: 1.2
Monocytes Absolute: 0.5

## 2010-12-16 LAB — COMPREHENSIVE METABOLIC PANEL
ALT: 66 — ABNORMAL HIGH
Alkaline Phosphatase: 84
CO2: 26
GFR calc non Af Amer: >60
Glucose, Bld: 98
Potassium: 4.2
Sodium: 138
Total Bilirubin: 0.5

## 2010-12-16 LAB — CBC
HCT: 39.4
Hemoglobin: 13.4
RDW: 12.3

## 2010-12-16 LAB — FERRITIN: Ferritin: 243 (ref 10–291)

## 2010-12-16 NOTE — Telephone Encounter (Signed)
Left message to call back  

## 2010-12-16 NOTE — Telephone Encounter (Signed)
Spoke with pt and reviewed echo with her

## 2010-12-16 NOTE — Telephone Encounter (Signed)
Pt is calling about test results please call

## 2010-12-28 ENCOUNTER — Telehealth: Payer: Self-pay

## 2010-12-28 ENCOUNTER — Telehealth: Payer: Self-pay | Admitting: *Deleted

## 2010-12-28 NOTE — Telephone Encounter (Signed)
Pt called requesting results of labs done 09/17. Results have expired from phone tree system, please advise.

## 2010-12-28 NOTE — Telephone Encounter (Signed)
Thyroid blood tests were normal.  Please return in 1 year, because the thyroid can become overactive with time.

## 2010-12-28 NOTE — Telephone Encounter (Signed)
Patient requesting lab results

## 2010-12-28 NOTE — Telephone Encounter (Signed)
LMOM to Inform patient. 

## 2010-12-29 NOTE — Telephone Encounter (Signed)
please see message earlier tuesday

## 2010-12-29 NOTE — Telephone Encounter (Signed)
Pt informed of lab results, copy of results mailed to address on file as requested by pt.

## 2011-01-11 ENCOUNTER — Encounter: Payer: Self-pay | Admitting: *Deleted

## 2011-01-11 ENCOUNTER — Encounter: Payer: Self-pay | Admitting: Cardiovascular Disease

## 2011-01-11 ENCOUNTER — Ambulatory Visit (INDEPENDENT_AMBULATORY_CARE_PROVIDER_SITE_OTHER): Payer: Federal, State, Local not specified - PPO | Admitting: Cardiovascular Disease

## 2011-01-11 VITALS — BP 125/83 | HR 104 | Ht 62.0 in | Wt 125.0 lb

## 2011-01-11 DIAGNOSIS — I471 Supraventricular tachycardia: Secondary | ICD-10-CM

## 2011-01-11 DIAGNOSIS — I498 Other specified cardiac arrhythmias: Secondary | ICD-10-CM

## 2011-01-11 MED ORDER — DILTIAZEM HCL ER COATED BEADS 120 MG PO CP24: 120.0000 mg | ORAL_CAPSULE | Freq: Every day | ORAL | Status: DC

## 2011-01-11 NOTE — Progress Notes (Signed)
History of Present Illness:55 yo female with history of anxiety, migraine headaches, mitral valve prolapse here today for follow up. She was seen as a new patient for cardiac evaluation November 29, 2010. She was seen in the [facility] ED 11/14/10 with c/o palpitations. EKG with SVT with rate of 178 bpm. Adenosine 6 mg was given IV x 1 and she converted to normal sinus rhythm. Dr. Donnie Aho was on call and recommended atenolol 25 mg po once daily and f/u in [facility]. She did not tolerate the atenolol.  I saw her 6 weeks ago and had her wear a 21 day event monitor.  Her event monitor showed sinus tachycardia with no evidence of atrial fibrillation, SVT or VT. She is feeling well. No chest pain or SOB. She is aware of her heart going faster at times but no recurrent episodes of feeling like she did with her SVT. She is tolerating cardizem.    Past Medical History  Diagnosis Date  . Low back pain   . Anxiety   . Migraines   . GOITER, MULTINODULAR 08/17/2009  . OSTEOPOROSIS 08/17/2009  . ASYMPTOMATIC POSTMENOPAUSAL STATUS 08/17/2009  . DYSPHAGIA 06/19/2007  . Supraventricular tachycardia   . Hyperlipidemia     Past Surgical History  Procedure Date  . Laporoscopic abdominal surgery   . Cervix surgery     Current Outpatient Prescriptions  Medication Sig Dispense Refill  . alendronate (FOSAMAX) 70 MG tablet Take 70 mg by mouth every 7 (seven) days. Take with a full glass of water on an empty stomach.       . ALPRAZolam (XANAX) 0.5 MG tablet Take 1 tablet (0.5 mg total) by mouth at bedtime as needed for sleep.  90 tablet  0  . aspirin 81 MG tablet Take 81 mg by mouth daily.        . calcium carbonate 200 MG capsule Take 250 mg by mouth daily.        . cholecalciferol (VITAMIN D) 1000 UNITS tablet Take 1,000 Units by mouth daily.        [name] Kitchen diltiazem (CARDIZEM CD) 120 MG 24 hr capsule Take 1 capsule (120 mg total) by mouth daily.  30 capsule  6  . glucosamine-chondroitin 500-400 MG tablet Take 1  tablet by mouth daily.        . norethindrone-ethinyl estradiol (FEMHRT 1/5) 1-5 MG-MCG TABS Take by mouth daily.        . pantoprazole (PROTONIX) 40 MG tablet Take 40 mg by mouth daily.        . simvastatin (ZOCOR) 20 MG tablet Take 1 tablet (20 mg total) by mouth at bedtime.  90 tablet  0  . SUMAtriptan (IMITREX) 100 MG tablet Take 1 tablet (100 mg total) by mouth every 2 (two) hours as needed for migraine. Maximum 300mg /24 hours  36 tablet  1    Allergies  Allergen Reactions  . Doxycycline   . Tetracycline Hcl     History   Social History  . Marital Status: Single    Spouse Name: N/A    Number of Children: N/A  . Years of Education: N/A   Occupational History  . Biologist    Social History Main Topics  . Smoking status: Never Smoker   . Smokeless tobacco: Never Used  . Alcohol Use: Not on file  . Drug Use: Not on file  . Sexually Active: Not on file   Other Topics Concern  . Not on file   Social History  Narrative  . No narrative on file    Family History  Problem Relation Age of Onset  . Thyroid disease Neg Hx   . Goiter Neg Hx     Review of Systems:  As stated in the HPI and otherwise negative.   BP 125/83  Pulse 104  Ht 5\' 2"  (1.575 m)  Wt 125 lb (56.7 kg)  BMI 22.86 kg/m2  Physical Examination: General: Well developed, well nourished, NAD HEENT: OP clear, mucus membranes moist SKIN: warm, dry. No rashes. Neuro: No focal deficits Musculoskeletal: Muscle strength 5/5 all ext Psychiatric: Mood and affect normal Neck: No JVD, no carotid bruits, no thyromegaly, no lymphadenopathy. Lungs:Clear bilaterally, no wheezes, rhonci, crackles Cardiovascular: Regular rate and rhythm. No murmurs, gallops or rubs. Abdomen:Soft. Bowel sounds present. Non-tender.  Extremities: No lower extremity edema. Pulses are 2 + in the bilateral DP/PT.  Echo 11/910:  Left ventricle: The cavity size was normal. Wall thickness was increased in a pattern of mild LVH. There was  mild focal basal hypertrophy of the septum. Systolic function was normal. The estimated ejection fraction was in the range of 60% to 65%. Wall motion was normal; there were no regional wall motion abnormalities. Features are consistent with a pseudonormal left ventricular filling pattern, with concomitant abnormal relaxation and increased filling pressure (grade 2 diastolic dysfunction).

## 2011-01-11 NOTE — Assessment & Plan Note (Signed)
No recurrent SVT on Cardizem. Event monitor without evidence of arrythmias. Will continue medical therapy with Cardizem. Echo with normal LV function.

## 2011-01-11 NOTE — Patient Instructions (Signed)
Your physician recommends that you continue on your current medications as directed. Please refer to the Current Medication list given to you today.  Your physician wants you to follow-up in: 6 months. You will receive a reminder letter in the mail two months in advance. If you don't receive a letter, please call our office to schedule the follow-up appointment.  

## 2011-01-12 ENCOUNTER — Telehealth: Payer: Self-pay | Admitting: Family Medicine

## 2011-01-12 MED ORDER — SIMVASTATIN 20 MG PO TABS: 20.0000 mg | ORAL_TABLET | Freq: Every day | ORAL | Status: DC

## 2011-01-12 NOTE — Telephone Encounter (Signed)
Simvastatin renewed

## 2011-01-13 ENCOUNTER — Telehealth: Payer: Self-pay | Admitting: Family Medicine

## 2011-01-13 NOTE — Telephone Encounter (Signed)
She is doing well on the present regimen and we will not change anything

## 2011-01-13 NOTE — Telephone Encounter (Signed)
Pharmacy notified that Dr. Did not want anything to be changed

## 2011-02-08 ENCOUNTER — Telehealth: Payer: Self-pay | Admitting: Family Medicine

## 2011-02-08 NOTE — Telephone Encounter (Signed)
Xanax called in 

## 2011-02-08 NOTE — Telephone Encounter (Signed)
Med called in

## 2011-02-08 NOTE — Telephone Encounter (Signed)
Renew her Xanax 

## 2011-02-18 ENCOUNTER — Other Ambulatory Visit: Payer: Self-pay | Admitting: Gastroenterology

## 2011-02-18 MED ORDER — PANTOPRAZOLE SODIUM 40 MG PO TBEC: 40.0000 mg | DELAYED_RELEASE_TABLET | Freq: Every day | ORAL | Status: DC

## 2011-02-18 NOTE — Telephone Encounter (Signed)
Let pt know that Protonix was sent in to her pharmacy

## 2011-03-22 ENCOUNTER — Encounter: Payer: Self-pay | Admitting: Family Medicine

## 2011-03-22 ENCOUNTER — Ambulatory Visit (INDEPENDENT_AMBULATORY_CARE_PROVIDER_SITE_OTHER): Payer: Federal, State, Local not specified - PPO | Admitting: Family Medicine

## 2011-03-22 VITALS — BP 120/70 | HR 109 | Ht 62.0 in | Wt 128.0 lb

## 2011-03-22 DIAGNOSIS — W5381XA Bitten by other rodent, initial encounter: Secondary | ICD-10-CM

## 2011-03-22 DIAGNOSIS — T148 Other injury of unspecified body region: Secondary | ICD-10-CM

## 2011-03-22 DIAGNOSIS — T148XXA Other injury of unspecified body region, initial encounter: Secondary | ICD-10-CM

## 2011-03-22 NOTE — Progress Notes (Signed)
  Subjective:    Patient ID: Terri Ayala, female    DOB: 07-May-1955, 56 y.o.   MRN: [id number]  HPI She was bitten by a squirrel on the index finger of her left hand on the dorsal surface 3 days ago. She is up-to-date on her tetanus shot. At this time she is having no difficulty.   Review of Systems     Objective:   Physical Exam Exam of the left index finger shows 2 puncture wounds however there is no erythema, warmth or tenderness.       Assessment & Plan:   1. Non-rat rodent bite    Continue with conservative care. Very very low risk of rabies because this being a rodent. She will return if any problems.

## 2011-05-09 ENCOUNTER — Telehealth: Payer: Self-pay | Admitting: Cardiovascular Disease

## 2011-05-09 NOTE — Telephone Encounter (Signed)
Pt was called and is doing better, heart not racing anymore, feels tired. Told her to call back with further questions or concerns or call er if after hours.

## 2011-05-09 NOTE — Telephone Encounter (Signed)
Pt to give the diltiazem about 2 hours and call back to let us know how she is doing since she is just took her diltiazem and feels that it might not be as fast as it was.  To come in for an ekg if her heart is still beating rapidly.  Pt was notified and agrees.  She was also told per Dr Jens Som to learn how to check her pulse rate.  I explained to her how to do it and recommended that she practice it.

## 2011-05-09 NOTE — Telephone Encounter (Signed)
Pt having episode of tachycardia since last night, ps call asap [phone]

## 2011-05-09 NOTE — Telephone Encounter (Signed)
Calling to report that her heart feels like it is beating fast.  She states it woke her up during the night.  She stayed up for a few hours and then she tried to rest without it resolving.  She thinks she forgot to take her diltiazem.  She took it this am and took her diltiazem about 45 minutes ago.  She states her heart is still racing but feels it has slowed down.

## 2011-05-12 ENCOUNTER — Other Ambulatory Visit: Payer: Self-pay | Admitting: Family Medicine

## 2011-05-12 MED ORDER — ALPRAZOLAM 0.5 MG PO TABS: 0.5000 mg | ORAL_TABLET | Freq: Every evening | ORAL | Status: DC | PRN

## 2011-05-12 NOTE — Telephone Encounter (Signed)
Okay for refill?  

## 2011-05-12 NOTE — Telephone Encounter (Signed)
Phoned in #90 of xanax .5mg  0 refills to [facility] [address].

## 2011-06-20 ENCOUNTER — Emergency Department (HOSPITAL_COMMUNITY)
Admission: EM | Admit: 2011-06-20 | Discharge: 2011-06-21 | Disposition: A | Discharge status: Home / Self Care | Payer: Federal, State, Local not specified - PPO | Attending: Emergency Medicine | Admitting: Emergency Medicine

## 2011-06-20 ENCOUNTER — Encounter (HOSPITAL_COMMUNITY): Payer: Self-pay | Admitting: *Deleted

## 2011-06-20 DIAGNOSIS — E785 Hyperlipidemia, unspecified: Secondary | ICD-10-CM | POA: Insufficient documentation

## 2011-06-20 DIAGNOSIS — F411 Generalized anxiety disorder: Secondary | ICD-10-CM | POA: Insufficient documentation

## 2011-06-20 DIAGNOSIS — E7800 Pure hypercholesterolemia, unspecified: Secondary | ICD-10-CM | POA: Insufficient documentation

## 2011-06-20 DIAGNOSIS — E042 Nontoxic multinodular goiter: Secondary | ICD-10-CM | POA: Insufficient documentation

## 2011-06-20 DIAGNOSIS — I059 Rheumatic mitral valve disease, unspecified: Secondary | ICD-10-CM | POA: Insufficient documentation

## 2011-06-20 DIAGNOSIS — F419 Anxiety disorder, unspecified: Secondary | ICD-10-CM

## 2011-06-20 DIAGNOSIS — R0789 Other chest pain: Secondary | ICD-10-CM | POA: Insufficient documentation

## 2011-06-20 HISTORY — DX: Nonrheumatic mitral (valve) prolapse: I34.1

## 2011-06-20 NOTE — ED Notes (Signed)
The pt has had chest pressure with a rapid heart rate all day.  She is still having a pressure sensation in her chest.

## 2011-06-21 ENCOUNTER — Emergency Department (HOSPITAL_COMMUNITY): Payer: Federal, State, Local not specified - PPO

## 2011-06-21 LAB — COMPREHENSIVE METABOLIC PANEL
AST: 81 U/L — ABNORMAL HIGH (ref 0–37)
Albumin: 4.7 g/dL (ref 3.5–5.2)
Alkaline Phosphatase: 74 U/L (ref 39–117)
BUN: 6 mg/dL (ref 6–23)
Chloride: 98 mEq/L (ref 96–112)
Potassium: 4.3 mEq/L (ref 3.5–5.1)
Sodium: 141 mEq/L (ref 135–145)
Total Bilirubin: 0.4 mg/dL (ref 0.3–1.2)
Total Protein: 7.9 g/dL (ref 6.0–8.3)

## 2011-06-21 LAB — DIFFERENTIAL
Basophils Absolute: 0.1 10*3/uL (ref 0.0–0.1)
Basophils Relative: 1 % (ref 0–1)
Eosinophils Absolute: 0 10*3/uL (ref 0.0–0.7)
Lymphocytes Relative: 25 % (ref 12–46)

## 2011-06-21 LAB — CK TOTAL AND CKMB (NOT AT ARMC): Total CK: 84 U/L (ref 7–177)

## 2011-06-21 LAB — CBC
HCT: 42.1 % (ref 36.0–46.0)
MCV: 100.7 fL — ABNORMAL HIGH (ref 78.0–100.0)
RBC: 4.18 MIL/uL (ref 3.87–5.11)
WBC: 5.1 10*3/uL (ref 4.0–10.5)

## 2011-06-21 LAB — TROPONIN I: Troponin I: <0.3 ng/mL (ref <0.30)

## 2011-06-21 MED ORDER — NAPROXEN 500 MG PO TABS
500.0000 mg | ORAL_TABLET | Freq: Once | ORAL | Status: AC
  Administered 2011-06-21: 500 mg via ORAL
  Filled 2011-06-21: qty 1

## 2011-06-21 MED ORDER — NITROGLYCERIN 0.4 MG SL SUBL
0.4000 mg | SUBLINGUAL_TABLET | SUBLINGUAL | Status: DC | PRN
  Administered 2011-06-21 (×2): 0.4 mg via SUBLINGUAL

## 2011-06-21 MED ORDER — LORAZEPAM 1 MG PO TABS
1.0000 mg | ORAL_TABLET | Freq: Once | ORAL | Status: AC
  Administered 2011-06-21: 1 mg via ORAL
  Filled 2011-06-21: qty 1

## 2011-06-21 NOTE — ED Provider Notes (Addendum)
History     CSN: [id number]  Arrival date & time 06/20/11  2308   First MD Initiated Contact with Patient 06/21/11 0037      Chief Complaint  Patient presents with  . Chest Pain    (Consider location/radiation/quality/duration/timing/severity/associated sxs/prior treatment) HPI Comments: 56 year old female with a history of anxiety, SVT, mitral valve prolapse, hypercholesterolemia. She presents to the hospital because she has been having a pressure in her chest which has been ongoing for greater than 12 hours. She states that her cat has been ill at home and she feels like she may have to put the cat down. This seems to make her feel anxious and upset and makes the pressure in her chest worse. She denies associated shortness of breath, nausea, diaphoresis, leg swelling, travel, trauma, oral contraceptive use, hormone pill use, swelling of the legs, or any other complaints. She states that the pain is not exertional, feels like a pressure and has been constant for greater than 12 hours. She has been seen by cardiology in the past for SVT and follows with [facility] - Dr. Clifton James  Currently the symptoms are mild.\  There is no family history of heart disease and she is not a smoker though she did use tobacco as a youth  Patient is a 56 y.o. female presenting with chest pain. The history is provided by the patient.  Chest Pain     Past Medical History  Diagnosis Date  . Low back pain   . Anxiety   . Migraines   . GOITER, MULTINODULAR 08/17/2009  . OSTEOPOROSIS 08/17/2009  . ASYMPTOMATIC POSTMENOPAUSAL STATUS 08/17/2009  . DYSPHAGIA 06/19/2007  . Supraventricular tachycardia   . Hyperlipidemia   . Mitral valve prolapse     Past Surgical History  Procedure Date  . Laporoscopic abdominal surgery   . Cervix surgery     Family History  Problem Relation Age of Onset  . Thyroid disease Neg Hx   . Goiter Neg Hx     History  Substance Use Topics  . Smoking status: Never  Smoker   . Smokeless tobacco: Never Used  . Alcohol Use: Yes    OB History    Grav Para Term Preterm Abortions TAB SAB Ect Mult Living                  Review of Systems  Cardiovascular: Positive for chest pain.  All other systems reviewed and are negative.    Allergies  Doxycycline and Tetracycline hcl  Home Medications   Current Outpatient Rx  Name Route Sig Dispense Refill  . ALENDRONATE SODIUM 70 MG PO TABS Oral Take 70 mg by mouth every 7 (seven) days. Take with a full glass of water on an empty stomach.    . ALPRAZOLAM 0.5 MG PO TABS Oral Take 0.5 mg by mouth daily as needed. For anxiety    . ASPIRIN 81 MG PO TABS Oral Take 81 mg by mouth daily.      [name] Kitchen CALCIUM CARBONATE 200 MG PO CAPS Oral Take 250 mg by mouth daily.      [name] Kitchen VITAMIN D 1000 UNITS PO TABS Oral Take 1,000 Units by mouth daily.      [name] Kitchen DILTIAZEM HCL ER COATED BEADS 120 MG PO CP24 Oral Take 120 mg by mouth daily.    [name] Kitchen GLUCOSAMINE-CHONDROITIN 500-400 MG PO TABS Oral Take 1 tablet by mouth daily.     Janetta Hora ESTRADIOL 1-5 MG-MCG PO TABS Oral Take 1 tablet  by mouth daily.     [name] Kitchen PANTOPRAZOLE SODIUM 40 MG PO TBEC Oral Take 40 mg by mouth daily.    [name] Kitchen SIMVASTATIN 20 MG PO TABS Oral Take 20 mg by mouth at bedtime.    . SUMATRIPTAN SUCCINATE 100 MG PO TABS Oral Take 100 mg by mouth every 2 (two) hours as needed. Maximum 300mg /24 hours      BP 136/85  Pulse 91  Temp(Src) 98.5 F (36.9 C) (Oral)  Resp 20  SpO2 100%  Physical Exam  Nursing note and vitals reviewed. Constitutional: She appears well-developed and well-nourished. No distress.  HENT:  Head: Normocephalic and atraumatic.  Mouth/Throat: Oropharynx is clear and moist. No oropharyngeal exudate.  Eyes: Conjunctivae and EOM are normal. Pupils are equal, round, and reactive to light. Right eye exhibits no discharge. Left eye exhibits no discharge. No scleral icterus.  Neck: Normal range of motion. Neck supple. No JVD present. No thyromegaly  present.  Cardiovascular: Normal rate, regular rhythm, normal heart sounds and intact distal pulses.  Exam reveals no gallop and no friction rub.   No murmur heard. Pulmonary/Chest: Effort normal and breath sounds normal. No respiratory distress. She has no wheezes. She has no rales.  Abdominal: Soft. Bowel sounds are normal. She exhibits no distension and no mass. There is no tenderness.  Musculoskeletal: Normal range of motion. She exhibits no edema and no tenderness.  Lymphadenopathy:    She has no cervical adenopathy.  Neurological: She is alert. Coordination normal.  Skin: Skin is warm and dry. No rash noted. No erythema.  Psychiatric:       Anxious appearing, chewing fingernails during interview    ED Course  Procedures (including critical care time)  ED ECG REPORT   Date: 06/21/2011   Rate: 97  Rhythm: normal sinus rhythm  QRS Axis: normal  Intervals: normal  ST/T Wave abnormalities: normal  Conduction Disutrbances:none  Narrative Interpretation:   Old EKG Reviewed: Compared with 11/14/2010, no significant changes, poor R wave progression still present.   Labs Reviewed  CBC - Abnormal; Notable for the following:    MCV 100.7 (*)    MCH 34.9 (*)    All other components within normal limits  COMPREHENSIVE METABOLIC PANEL - Abnormal; Notable for the following:    AST 81 (*)    All other components within normal limits  DIFFERENTIAL  CK TOTAL AND CKMB  TROPONIN I   Dg Chest 2 View  06/21/2011  *RADIOLOGY REPORT*  Clinical Data: Shortness of breath  CHEST - 2 VIEW  Comparison: 11/14/2010  Findings: Normal heart size and pulmonary vascularity. Emphysematous changes and scattered fibrosis in the lungs.  No blunting of costophrenic angles.  No pneumothorax.  No focal consolidation.  Vague nodular opacity over the left lower lung likely representing a prominent nipple shadow.  No significant changes since the previous study.  IMPRESSION: Emphysematous changes in the lungs.  No  evidence of active pulmonary disease.  Original Report Authenticated By: Marlon Pel, M.D.     1. Chest pain   2. Anxiety       MDM  Patient is very few risk factors for heart disease, hypercholesterolemia only. She has a normal EKG showing no signs of tachycardia though there is some poor R wave progression. Her old EKG is almost identical. We'll get cardiac enzymes and chest x-ray to rule out other sources of pain though this appears to be anxiety driven.   Blood test reveal no elevation of troponin despite having  12 hours of constant pain. Patient feels that this gets worse when she thinks about her animals, this is her source of anxiety, suspect this is the source of her symptoms. I have encouraged her to followup with her family doctor, she does not want to stay for a second test if cardiac enzymes, patient is amenable to discharge, feel the risk for cardiac source.  The patient has ongoing pain, I have discussed with her the need for admission for further evaluation including possible stress test but she has declined this stating that she wants to followup as an outpatient. She has a cardiologist and we'll call him in the morning.   Vida Roller, MD 06/21/11 1610  Vida Roller, MD 06/21/11 [phone]

## 2011-06-21 NOTE — ED Notes (Signed)
EDP at BS 

## 2011-06-21 NOTE — ED Notes (Signed)
No relief with 2 ntg, developed HA. EDP aware, orders received and initiated.

## 2011-06-21 NOTE — ED Notes (Addendum)
Alert, NAD, calm, ready to go, denies dizziness, steady gait, declined w/c, declines admission or further testing, rates pressure the same 4/10. Friend here for ride.

## 2011-06-21 NOTE — Discharge Instructions (Signed)
Your caregiver has diagnosed you as having chest pain that is nonspecific for one problem. This means that after looking at you and examining you and ordering tests (such as blood work, chest x-rays and EKG), your caregiver does not believe that the problem is serious enough to need watching in the hospital. This judgment is often made after testing shows no acute heart attack and you are at low risk for sudden acute heart condition. Chest pain comes from many different causes.  Please call your family doctor to further arrange an outpatient stress test to further evaluate your coronary arteries  Seek immediate medical attention if:   You have severe chest pain, especially if the pain is crushing or pressure-like and spreads to the arms, back, neck, or jaw, or if you have sweating, nausea, shortness of breath. This is an emergency. Don't wait to see if the pain will go away. Get medical help at once. Call 911 immediately. Do not drive herself to the hospital.   Your chest pain gets worse and does not go away with rest.   You have an attack of chest pain lasting longer than usual, despite rest and treatment with the medications your caregiver has prescribed   You awaken from sleep with chest pain or shortness of breath.   You feel faint or dizzy   You have chest pain not typical of your usual pain for which you originally saw your caregiver.  You must have a repeat evaluation within 24 hours for a recheck of your heart.  Please call your doctor this morning to schedule this appointment. If you do not have a family doctor, please see the list of doctors below.  RESOURCE GUIDE  Dental Problems  Patients with Medicaid: [facility] [phone]                                           [phone] [address] Phone:  [phone]                                                  Phone:  [phone]  If unable to pay or uninsured, contact:   [facility] or [facility]. to become qualified for the adult dental clinic.  Chronic Pain Problems Contact [facility]  [phone] Patients need to be referred by their primary care doctor.  Insufficient Money for Medicine Contact [employer] Way:  call "211" or [facility] 947-116-7500.  No Primary Care Doctor Call [facility]  [phone] Other agencies that provide inexpensive medical care    [facility]  [phone]    [facility]  [phone]    [facility]  [phone]    [facility]  [phone]    [facility]  [phone]    [facility]  [phone]  Psychological Services [facility]  [phone] [facility] Services  [phone] [facility]   [phone] (emergency services [phone])  Substance Abuse Resources [facility] Services  772-524-7806 [facility] [phone] [facility] [place] [phone] [facility] Psych[facility]  9737231929 Residential & Outpatient Substance Abuse Program  365-713-3410  Abuse/Neglect [facility] Child Abuse Hotline 667-072-4175 [facility] Child Abuse Hotline (682) 014-7267 (After Hours)  Emergency Shelter [facility] (860)774-4415  Maternity Homes Room at the [place] [facility] [place] 615-687-0633 [facility] (406)[phone]  MRSA Hotline #:   331-668-9386    [facility] Resources  [facility]     [employer]                          [facility] Dept. [address] [place]                       [address]      [address]  [place]ell Reveal Phone:  [phone]                                   Phone:  [phone]                 Phone:  [phone]  [facility] Mental Health Phone:  [phone]  [facility] Child Abuse Hotline (831)381-4228 564-029-3052 (After Hours)

## 2011-06-21 NOTE — ED Notes (Signed)
Pt HR 100-103, ST, c/o pressure, (denies: nvd, fever, caffiene use, cough congestion sob, cold sx or other sx), pressure remains. Onset today. H/o same, h/o SVT, followed by Dr. Clifton James (cards), last episode ~ 3 months ago, "has never been able to stop it herself w/o meds".

## 2011-06-24 ENCOUNTER — Ambulatory Visit: Payer: Federal, State, Local not specified - PPO | Admitting: Physician Assistant

## 2011-06-29 ENCOUNTER — Ambulatory Visit: Payer: Federal, State, Local not specified - PPO | Admitting: Physician Assistant

## 2011-06-29 ENCOUNTER — Encounter: Payer: Self-pay | Admitting: Internal Medicine

## 2011-06-29 ENCOUNTER — Ambulatory Visit (INDEPENDENT_AMBULATORY_CARE_PROVIDER_SITE_OTHER): Payer: Federal, State, Local not specified - PPO | Admitting: Physician Assistant

## 2011-06-29 ENCOUNTER — Encounter: Payer: Self-pay | Admitting: Physician Assistant

## 2011-06-29 VITALS — BP 104/58 | HR 87 | Ht 62.0 in | Wt 126.0 lb

## 2011-06-29 DIAGNOSIS — R002 Palpitations: Secondary | ICD-10-CM

## 2011-06-29 DIAGNOSIS — R079 Chest pain, unspecified: Secondary | ICD-10-CM

## 2011-06-29 DIAGNOSIS — I498 Other specified cardiac arrhythmias: Secondary | ICD-10-CM

## 2011-06-29 DIAGNOSIS — I471 Supraventricular tachycardia: Secondary | ICD-10-CM

## 2011-06-29 LAB — BASIC METABOLIC PANEL
BUN: 8 mg/dL (ref 6–23)
Chloride: 104 mEq/L (ref 96–112)
Creat: 0.61 mg/dL (ref 0.50–1.10)
Potassium: 4.1 mEq/L (ref 3.5–5.3)

## 2011-06-29 LAB — D-DIMER, QUANTITATIVE: D-Dimer, Quant: <0.22 ug/mL-FEU (ref 0.00–0.48)

## 2011-06-29 LAB — TROPONIN I: Troponin I: <0.01 ng/mL (ref <0.06)

## 2011-06-29 NOTE — Patient Instructions (Signed)
Your physician has requested that you have en exercise stress myoview DX CHEST PAIN, PER DR. KLEIN AND SCOTT WEAVER, PAC PT NEEDS TO HAVE STRESS TEST TOMORROW 06/30/11. PER DR. KLEIN AND SCOTT WEAVER, PAC IF PT COMES IN FOR TEST WITH ACTIVE CHEST PAIN THEN OK TO CHANGE TEST TO JUST A REST STUDY. For further information please visit [email or web address] Please follow instruction sheet, as given.  STAT LABS TODAY, BMET, D-DIMER, TROPONIN I DX 786.50  PLEASE SCHEDULE A FOLLOW UP APPT WITH DR. Clifton James FOR 2 WEEKS

## 2011-06-29 NOTE — Progress Notes (Signed)
[address] [place], [place]  [address] Phone: [phone] Fax:  [phone]  Date:  06/29/2011   Name:  Terri Ayala       DOB:  08-20-1955 MRN:  [id number]  PCP:  Carollee Herter, MD, MD  Primary Cardiologist:  Dr. Verne Carrow  Primary Electrophysiologist:  None    History of Present Illness: Terri Ayala is a 56 y.o. female who   She has a history of anxiety, migraine headaches, mitral valve prolapse, SVT. She was seen as a new patient for cardiac evaluation November 29, 2010.  She was seen in the [facility] ED 11/14/10 with c/o palpitations.  EKG with SVT with rate of 178 bpm.  Adenosine 6 mg was given IV x 1 and she converted to normal sinus rhythm.  Dr. Donnie Aho was on call and recommended atenolol 25 mg po once daily and f/u in [facility].  She did not tolerate the atenolol.  Wore event monitor that showed sinus tachycardia with no evidence of atrial fibrillation, SVT or VT.   2D-Echocardiogram 12/07/10: Study Conclusions  Left ventricle: The cavity size was normal. Wall thickness was increased in a pattern of mild LVH. There was mild focal basal hypertrophy of the septum. Systolic function was normal. The estimated ejection fraction was in the range of 60% to 65%. Wall motion was normal; there were no regional wall motion abnormalities. Features are consistent with a pseudonormal left ventricular filling pattern, with concomitant abnormal relaxation and increased filling pressure (grade 2 diastolic dysfunction).  Comes in with c/o's chest pain and palpitations over the last 7-10 days.  Went to the ED 4/22.  Pain was ongoing for 8 hours at that point.  CE's were neg x 1.  Note from ED doctor suggests that he recommended admission but patient declined.  Feels symptoms like what she had with SVT.  But this is not as intense.  Describes chest pressure.  No radiation.  Feels somewhat dyspneic.  No nausea or diaphoresis.  She notes  worsening symptoms with exertion (ADLs).  No syncope, orthopnea, PND, edema.    Past Medical History  Diagnosis Date  . Low back pain   . Anxiety   . Migraines   . GOITER, MULTINODULAR 08/17/2009  . OSTEOPOROSIS 08/17/2009  . ASYMPTOMATIC POSTMENOPAUSAL STATUS 08/17/2009  . DYSPHAGIA 06/19/2007  . Supraventricular tachycardia   . Hyperlipidemia   . Mitral valve prolapse     Current Outpatient Prescriptions  Medication Sig Dispense Refill  . alendronate (FOSAMAX) 70 MG tablet Take 70 mg by mouth every 7 (seven) days. Take with a full glass of water on an empty stomach.      . ALPRAZolam (XANAX) 0.5 MG tablet Take 0.5 mg by mouth daily as needed. For anxiety      . aspirin 81 MG tablet Take 81 mg by mouth daily.        . calcium carbonate 200 MG capsule Take 250 mg by mouth daily.        . cholecalciferol (VITAMIN D) 1000 UNITS tablet Take 1,000 Units by mouth daily.        [name] Kitchen diltiazem (CARDIZEM CD) 120 MG 24 hr capsule Take 120 mg by mouth daily.      [name] Kitchen glucosamine-chondroitin 500-400 MG tablet Take 1 tablet by mouth daily.       . norethindrone-ethinyl estradiol (FEMHRT 1/5) 1-5 MG-MCG TABS Take 1 tablet by mouth daily.       . pantoprazole (PROTONIX) 40  MG tablet Take 40 mg by mouth daily.      . simvastatin (ZOCOR) 20 MG tablet Take 20 mg by mouth at bedtime.      . SUMAtriptan (IMITREX) 100 MG tablet Take 100 mg by mouth every 2 (two) hours as needed. Maximum 300mg /24 hours        Allergies: Allergies  Allergen Reactions  . Doxycycline Other (See Comments)    Reaction unknown  . Tetracycline Hcl Other (See Comments)    Reaction unknown    History  Substance Use Topics  . Smoking status: Never Smoker   . Smokeless tobacco: Never Used  . Alcohol Use: Yes     Family History  Problem Relation Age of Onset  . Thyroid disease Neg Hx   . Goiter Neg Hx      ROS:  Please see the history of present illness.   No fever, chills, cough, dysphagia, odynophagia, hematemesis,  hemoptysis, CP with lying flat, pleuritic CP, CP with meals.  All other systems reviewed and negative.   PHYSICAL EXAM: VS:  BP 104/58  Pulse 87  Ht 5\' 2"  (1.575 m)  Wt 126 lb (57.153 kg)  BMI 23.05 kg/m2 Well nourished, well developed, in no acute distress HEENT: normal Neck: no JVD Vascular: no carotid bruits Endocrine: no thyromegaly  Cardiac:  normal S1, S2; RRR; no murmur Lungs:  clear to auscultation bilaterally, no wheezing, rhonchi or rales Abd: soft, nontender, no hepatomegaly Ext: no edema Skin: warm and dry Neuro:  CNs 2-12 intact, no focal abnormalities noted Psych: slightly anxious   EKG:  Sinus rhythm, rate 87, normal axis, no acute changes, no change from prior tracing  ASSESSMENT AND PLAN:  1. Chest Pain She has had ongoing CP that has been fairly constant.  Likelihood of obstructive CAD or ACS is low.  Considered cath vs CT vs myoview.  D/w Dr. Sherryl Manges (DOD).  We decided to proceed with ETT-Myoview tomorrow.  If having CP when she comes in, change to rest study only.  If negative scan with CP, will be able to r/o ischemia.  Check Troponin and DDimer.  If troponin +, admit for cath.  If DDimer +, send for Chest CT.    2. Palpitations EKG ok today.  No documentation of recurrent SVT.  Consider adjusting Diltiazem after further testing.    3. SVT Follow up with Dr. Verne Carrow in 2 weeks.    Signed, Tereso Newcomer, PA-C  4:08 PM 06/29/2011

## 2011-06-30 ENCOUNTER — Ambulatory Visit (HOSPITAL_COMMUNITY): Payer: Federal, State, Local not specified - PPO | Attending: Internal Medicine | Admitting: Radiology

## 2011-06-30 ENCOUNTER — Telehealth: Payer: Self-pay | Admitting: Physician Assistant

## 2011-06-30 ENCOUNTER — Telehealth: Payer: Self-pay | Admitting: *Deleted

## 2011-06-30 VITALS — BP 127/65 | Ht 62.0 in | Wt 123.0 lb

## 2011-06-30 DIAGNOSIS — R0989 Other specified symptoms and signs involving the circulatory and respiratory systems: Secondary | ICD-10-CM | POA: Insufficient documentation

## 2011-06-30 DIAGNOSIS — R079 Chest pain, unspecified: Secondary | ICD-10-CM | POA: Insufficient documentation

## 2011-06-30 DIAGNOSIS — R000 Tachycardia, unspecified: Secondary | ICD-10-CM | POA: Insufficient documentation

## 2011-06-30 DIAGNOSIS — R0602 Shortness of breath: Secondary | ICD-10-CM

## 2011-06-30 DIAGNOSIS — E785 Hyperlipidemia, unspecified: Secondary | ICD-10-CM | POA: Insufficient documentation

## 2011-06-30 DIAGNOSIS — R0609 Other forms of dyspnea: Secondary | ICD-10-CM | POA: Insufficient documentation

## 2011-06-30 DIAGNOSIS — R002 Palpitations: Secondary | ICD-10-CM

## 2011-06-30 MED ORDER — TECHNETIUM TC 99M TETROFOSMIN IV KIT
33.0000 | PACK | Freq: Once | INTRAVENOUS | Status: AC | PRN
  Administered 2011-06-30: 33 via INTRAVENOUS

## 2011-06-30 MED ORDER — TECHNETIUM TC 99M TETROFOSMIN IV KIT
10.8000 | PACK | Freq: Once | INTRAVENOUS | Status: AC | PRN
  Administered 2011-06-30: 11 via INTRAVENOUS

## 2011-06-30 NOTE — Telephone Encounter (Signed)
Message copied by Tarri Fuller on Thu Jun 30, 2011  3:56 PM ------      Message from: [place], [place] T      Created: Thu Jun 30, 2011  1:56 PM       Please notify patient that the lab results are ok.      DDimer and Troponin negative       Tereso Newcomer, PA-C  1:56 PM 06/30/2011

## 2011-06-30 NOTE — Telephone Encounter (Signed)
lmom labs ok 

## 2011-06-30 NOTE — Telephone Encounter (Signed)
Please notify patient DDimer and Troponin were both normal. Tereso Newcomer, PA-C  1:10 PM 06/30/2011

## 2011-06-30 NOTE — Progress Notes (Signed)
[facility] SITE 3 NUCLEAR MED [address] [place] [address] [phone]  Cardiology Nuclear Med Study  Terri Ayala is a 56 y.o. female     MRN : [id number]     DOB: [birth year]  Procedure Date: 06/30/2011  Nuclear Med Background Indication for Stress Test:  Evaluation for Ischemia, and Patient seen in [facility] ED on 06/20/11 for CP, Enzymes negative x 1, ED physician wanted to admit but patient declined History:  11/2010 Echo EF 60-65% Cardiac Risk Factors: Lipids  Symptoms:  Chest Pain, DOE, Fatigue, Palpitations and Rapid HR   Nuclear Pre-Procedure Caffeine/Decaff Intake:  None> 12 hrs NPO After: 8:00pm   Lungs:  clear O2 Sat: 99% on room air. IV 0.9% NS with Angio Cath:  22g  IV Site: R Antecubital x 1, tolerated well IV Started by:  Irean Hong, RN  Chest Size (in):  32 Cup Size: B  Height: 5\' 2"  (1.575 m)  Weight:  123 lb (55.792 kg)  BMI:  Body mass index is 22.50 kg/(m^2). Tech Comments:  N/A    Nuclear Med Study 1 or 2 day study: 1 day  Stress Test Type:  Stress  Reading MD: Cassell Clement, MD  Order Authorizing Provider:  Verne Carrow, MD, and Tereso Newcomer, [facility]  Resting Radionuclide: Technetium 21m Tetrofosmin  Resting Radionuclide Dose: 10.8 mCi   Stress Radionuclide:  Technetium 29m Tetrofosmin  Stress Radionuclide Dose: 33.0 mCi           Stress Protocol Rest HR: 79 Stress HR: 157  Rest BP: 127/65 Stress BP: 164/71  Exercise Time (min): 7:16 METS: 8.9   Predicted Max HR: 164 bpm % Max HR: 95.73 bpm Rate Pressure Product: 21308   Dose of Adenosine (mg):  n/a Dose of Lexiscan: n/a mg  Dose of Atropine (mg): n/a Dose of Dobutamine: n/a mcg/kg/min (at max HR)  Stress Test Technologist: Bonnita Levan, RN  Nuclear Technologist:  Doyne Keel, CNMT     Rest Procedure:  Myocardial perfusion imaging was performed at rest 45 minutes following the intravenous administration of Technetium 17m Tetrofosmin. Rest ECG: No acute  changes  Stress Procedure:  The patient performed treadmill exercise using a Bruce  Protocol for 7:16 minutes. The patient stopped due to Dyspnea and denied any chest pain.  There were no significant ST-T wave changes. Rare PVC noted.  Technetium 86m Tetrofosmin was injected at peak exercise and myocardial perfusion imaging was performed after a brief delay. Stress ECG: No significant change from baseline ECG  QPS Raw Data Images:  Normal; no motion artifact; normal heart/lung ratio. Stress Images:  Normal homogeneous uptake in all areas of the myocardium. Rest Images:  Normal homogeneous uptake in all areas of the myocardium. Subtraction (SDS):  No evidence of ischemia. Transient Ischemic Dilatation (Normal <1.22):  0.93 Lung/Heart Ratio (Normal <0.45):  0.32  Quantitative Gated Spect Images QGS EDV:  61 ml QGS ESV:  12 ml  Impression Exercise Capacity:  Good exercise capacity. BP Response:  Normal blood pressure response. Clinical Symptoms:  No chest pain. ECG Impression:  No significant ST segment change suggestive of ischemia. Comparison with Prior Nuclear Study: No images to compare  Overall Impression:  Normal stress nuclear study.  LV Ejection Fraction: 81%.  LV Wall Motion:  NL LV Function; NL Wall Motion   [employer]

## 2011-07-01 ENCOUNTER — Telehealth: Payer: Self-pay | Admitting: *Deleted

## 2011-07-01 NOTE — Telephone Encounter (Signed)
Message copied by Tarri Fuller on Fri Jul 01, 2011  5:00 PM ------      Message from: [place], [place] T      Created: Fri Jul 01, 2011  4:39 PM       Please inform patient stress test normal.      Tereso Newcomer, PA-C  4:38 PM 07/01/2011

## 2011-07-01 NOTE — Telephone Encounter (Signed)
lmom stress test normal 

## 2011-07-12 ENCOUNTER — Ambulatory Visit (INDEPENDENT_AMBULATORY_CARE_PROVIDER_SITE_OTHER): Payer: Federal, State, Local not specified - PPO | Admitting: Cardiovascular Disease

## 2011-07-12 ENCOUNTER — Encounter: Payer: Self-pay | Admitting: Cardiovascular Disease

## 2011-07-12 VITALS — BP 112/70 | HR 96 | Ht 62.0 in | Wt 128.0 lb

## 2011-07-12 DIAGNOSIS — I498 Other specified cardiac arrhythmias: Secondary | ICD-10-CM

## 2011-07-12 DIAGNOSIS — I471 Supraventricular tachycardia: Secondary | ICD-10-CM

## 2011-07-12 DIAGNOSIS — R079 Chest pain, unspecified: Secondary | ICD-10-CM | POA: Insufficient documentation

## 2011-07-12 NOTE — Progress Notes (Signed)
History of Present Illness: 56 yo female with history of anxiety, migraine headaches, mitral valve prolapse here today for follow up. She was seen as a new patient for cardiac evaluation November 29, 2010. She was seen in the [facility] ED 11/14/10 with c/o palpitations. EKG with SVT with rate of 178 bpm. Adenosine 6 mg was given IV x 1 and she converted to normal sinus rhythm. Dr. Donnie Aho was on call and recommended atenolol 25 mg po once daily and f/u in [facility]. She did not tolerate the atenolol. I saw her last in November 2012 and  had her wear a 21 day event monitor. Her event monitor showed sinus tachycardia with no evidence of atrial fibrillation, SVT or VT. She was seen by Tereso Newcomer, PA-C on 06/29/11 for evaluation of chest pain that was felt to be atypical. This was follow up for an ED visit for the same. A stress myoview was arranged on 06/30/11 and showed no evidence of ischemia with normal LV function. D-dimer and troponin were negative.   She is here today for follow up. She is feeling well. No chest pain or SOB. She is aware of her heart going faster at times but no recurrent episodes of feeling like she did with her SVT. She is tolerating cardizem.   Primary Care Physician: Sharlot Gowda  Past Medical History  Diagnosis Date  . Low back pain   . Anxiety   . Migraines   . GOITER, MULTINODULAR 08/17/2009  . OSTEOPOROSIS 08/17/2009  . ASYMPTOMATIC POSTMENOPAUSAL STATUS 08/17/2009  . DYSPHAGIA 06/19/2007  . Supraventricular tachycardia   . Hyperlipidemia   . Mitral valve prolapse     Past Surgical History  Procedure Date  . Laporoscopic abdominal surgery   . Cervix surgery     Current Outpatient Prescriptions  Medication Sig Dispense Refill  . alendronate (FOSAMAX) 70 MG tablet Take 70 mg by mouth every 7 (seven) days. Take with a full glass of water on an empty stomach.      . ALPRAZolam (XANAX) 0.5 MG tablet Take 0.5 mg by mouth daily as needed. For anxiety      .  aspirin 81 MG tablet Take 81 mg by mouth daily.        . calcium carbonate 200 MG capsule Take 250 mg by mouth daily.        . cholecalciferol (VITAMIN D) 1000 UNITS tablet Take 1,000 Units by mouth daily.        [name] Kitchen diltiazem (CARDIZEM CD) 120 MG 24 hr capsule Take 120 mg by mouth daily.      [name] Kitchen glucosamine-chondroitin 500-400 MG tablet Take 1 tablet by mouth daily.       . norethindrone-ethinyl estradiol (FEMHRT 1/5) 1-5 MG-MCG TABS Take 1 tablet by mouth daily.       . pantoprazole (PROTONIX) 40 MG tablet Take 40 mg by mouth daily.      . simvastatin (ZOCOR) 20 MG tablet Take 20 mg by mouth at bedtime.      . SUMAtriptan (IMITREX) 100 MG tablet Take 100 mg by mouth every 2 (two) hours as needed. Maximum 300mg /24 hours        Allergies  Allergen Reactions  . Doxycycline Other (See Comments)    Reaction unknown  . Tetracycline Hcl Other (See Comments)    Reaction unknown    History   Social History  . Marital Status: Single    Spouse Name: N/A    Number of Children: N/A  .  Years of Education: N/A   Occupational History  . Biologist    Social History Main Topics  . Smoking status: Never Smoker   . Smokeless tobacco: Never Used  . Alcohol Use: Yes  . Drug Use: Not on file  . Sexually Active: Not on file   Other Topics Concern  . Not on file   Social History Narrative  . No narrative on file    Family History  Problem Relation Age of Onset  . Thyroid disease Neg Hx   . Goiter Neg Hx     Review of Systems:  As stated in the HPI and otherwise negative.   BP 112/70  Pulse 96  Ht 5\' 2"  (1.575 m)  Wt 128 lb (58.06 kg)  BMI 23.41 kg/m2  Physical Examination: General: Well developed, well nourished, NAD HEENT: OP clear, mucus membranes moist SKIN: warm, dry. No rashes. Neuro: No focal deficits Musculoskeletal: Muscle strength 5/5 all ext Psychiatric: Mood and affect normal Neck: No JVD, no carotid bruits, no thyromegaly, no lymphadenopathy. Lungs:Clear  bilaterally, no wheezes, rhonci, crackles Cardiovascular: Regular rate and rhythm. No murmurs, gallops or rubs. Abdomen:Soft. Bowel sounds present. Non-tender.  Extremities: No lower extremity edema. Pulses are 2 + in the bilateral DP/PT.  Stress myoview 06/30/11:  Stress Procedure: The patient performed treadmill exercise using a Bruce Protocol for 7:16 minutes. The patient stopped due to Dyspnea and denied any chest pain. There were no significant ST-T wave changes. Rare PVC noted. Technetium 26m Tetrofosmin was injected at peak exercise and myocardial perfusion imaging was performed after a brief delay.  Stress ECG: No significant change from baseline ECG  QPS  Raw Data Images: Normal; no motion artifact; normal heart/lung ratio.  Stress Images: Normal homogeneous uptake in all areas of the myocardium.  Rest Images: Normal homogeneous uptake in all areas of the myocardium.  Subtraction (SDS): No evidence of ischemia.  Transient Ischemic Dilatation (Normal <1.22): 0.93  Lung/Heart Ratio (Normal <0.45): 0.32  Quantitative Gated Spect Images  QGS EDV: 61 ml  QGS ESV: 12 ml  Impression  Exercise Capacity: Good exercise capacity.  BP Response: Normal blood pressure response.  Clinical Symptoms: No chest pain.  ECG Impression: No significant ST segment change suggestive of ischemia.  Comparison with Prior Nuclear Study: No images to compare  Overall Impression: Normal stress nuclear study.  LV Ejection Fraction: 81%. LV Wall Motion: NL LV Function; NL Wall Motion

## 2011-07-12 NOTE — Patient Instructions (Signed)
Your physician wants you to follow-up in:  6 months. You will receive a reminder letter in the mail two months in advance. If you don't receive a letter, please call our office to schedule the follow-up appointment.   

## 2011-07-12 NOTE — Assessment & Plan Note (Signed)
Stress test without ischemia. No further ischemic testing.

## 2011-07-12 NOTE — Assessment & Plan Note (Signed)
No recurrence. Tolerating Cardizem CD 120 mg po Qdaily. She will take an extra Cardizem if she feels palpitations.

## 2011-08-08 ENCOUNTER — Telehealth: Payer: Self-pay | Admitting: Family Medicine

## 2011-08-08 NOTE — Telephone Encounter (Signed)
Have her set up an appointment for medication check

## 2011-08-08 NOTE — Telephone Encounter (Signed)
Pt req Xanax refill .5mg  qd  #90   [facility] [address]

## 2011-08-08 NOTE — Telephone Encounter (Signed)
Pt has appt 6/24

## 2011-08-15 ENCOUNTER — Other Ambulatory Visit: Payer: Self-pay | Admitting: Dermatology

## 2011-08-17 ENCOUNTER — Other Ambulatory Visit: Payer: Self-pay | Admitting: Family Medicine

## 2011-08-17 NOTE — Telephone Encounter (Signed)
RX CALLED IN-        PLS SIGN OFF ON MED

## 2011-08-18 MED ORDER — ALPRAZOLAM 0.5 MG PO TABS: 0.5000 mg | ORAL_TABLET | Freq: Every day | ORAL | Status: DC | PRN

## 2011-08-19 ENCOUNTER — Other Ambulatory Visit: Payer: Self-pay | Admitting: Obstetrics and Gynecology

## 2011-08-19 DIAGNOSIS — Z1231 Encounter for screening mammogram for malignant neoplasm of breast: Secondary | ICD-10-CM

## 2011-08-22 ENCOUNTER — Encounter: Payer: Federal, State, Local not specified - PPO | Admitting: Family Medicine

## 2011-08-29 ENCOUNTER — Ambulatory Visit: Payer: Federal, State, Local not specified - PPO

## 2011-09-12 ENCOUNTER — Ambulatory Visit: Payer: Federal, State, Local not specified - PPO

## 2011-09-19 ENCOUNTER — Encounter: Payer: Federal, State, Local not specified - PPO | Admitting: Family Medicine

## 2011-09-26 ENCOUNTER — Ambulatory Visit: Payer: Federal, State, Local not specified - PPO

## 2011-10-10 ENCOUNTER — Ambulatory Visit
Admission: RE | Admit: 2011-10-10 | Discharge: 2011-10-10 | Disposition: A | Discharge status: Home / Self Care | Payer: Federal, State, Local not specified - PPO | Source: Ambulatory Visit | Attending: Obstetrics and Gynecology | Admitting: Obstetrics and Gynecology

## 2011-10-10 DIAGNOSIS — Z1231 Encounter for screening mammogram for malignant neoplasm of breast: Secondary | ICD-10-CM

## 2011-10-19 ENCOUNTER — Encounter: Payer: Self-pay | Admitting: Family Medicine

## 2011-10-19 ENCOUNTER — Ambulatory Visit (INDEPENDENT_AMBULATORY_CARE_PROVIDER_SITE_OTHER): Payer: Federal, State, Local not specified - PPO | Admitting: Family Medicine

## 2011-10-19 VITALS — BP 118/70 | HR 104 | Wt 125.0 lb

## 2011-10-19 DIAGNOSIS — Z780 Asymptomatic menopausal state: Secondary | ICD-10-CM

## 2011-10-19 DIAGNOSIS — Z79899 Other long term (current) drug therapy: Secondary | ICD-10-CM

## 2011-10-19 DIAGNOSIS — G43909 Migraine, unspecified, not intractable, without status migrainosus: Secondary | ICD-10-CM

## 2011-10-19 DIAGNOSIS — E042 Nontoxic multinodular goiter: Secondary | ICD-10-CM

## 2011-10-19 DIAGNOSIS — F411 Generalized anxiety disorder: Secondary | ICD-10-CM

## 2011-10-19 LAB — CBC WITH DIFFERENTIAL/PLATELET
Basophils Absolute: 0.1 10*3/uL (ref 0.0–0.1)
Basophils Relative: 1 % (ref 0–1)
Eosinophils Relative: 1 % (ref 0–5)
HCT: 35.2 % — ABNORMAL LOW (ref 36.0–46.0)
MCHC: 34.9 g/dL (ref 30.0–36.0)
MCV: 98.3 fL (ref 78.0–100.0)
Monocytes Absolute: 0.6 10*3/uL (ref 0.1–1.0)
Platelets: 219 10*3/uL (ref 150–400)
RDW: 12.8 % (ref 11.5–15.5)

## 2011-10-19 MED ORDER — ALPRAZOLAM 0.5 MG PO TABS: 0.5000 mg | ORAL_TABLET | Freq: Every day | ORAL | Status: DC | PRN

## 2011-10-19 NOTE — Progress Notes (Signed)
  Subjective:    Patient ID: Terri Ayala, female    DOB: April 22, 1955, 56 y.o.   MRN: [id number]  HPI She is here for medication check. She continues on her blood pressure medication without difficulty. She also takes Fosamax for osteoporosis. She does use Xanax on a daily basis to help with anxiety especially revolving around work. She does have underlying migraine headaches and they seem to be under good control. Also reflux symptoms caused little difficulty. She does have a multinodular goiter and will need followup on this. She continues on over-the-counter medications without difficulty. She is on hormone replacement for prevention of menopausal symptoms.   Review of Systems     Objective:   Physical Exam Alert and in no distress otherwise not examined       Assessment & Plan:   1. MIGRAINE HEADACHE    2. GOITER, MULTINODULAR  Thyroid Panel With TSH  3. ASYMPTOMATIC POSTMENOPAUSAL STATUS    4. ANXIETY  ALPRAZolam (XANAX) 0.5 MG tablet  5. Encounter for long-term (current) use of other medications  CBC with Differential, Comprehensive metabolic panel, Lipid panel   Discussed migraine headaches as well as followup on her goiter with her. We also discussed use of Xanax. Encouraged her to use this sparingly. She is using it usually once per day.

## 2011-10-20 LAB — THYROID PANEL WITH TSH
T3 Uptake: 32.7 % (ref 22.5–37.0)
T4, Total: 8 ug/dL (ref 5.0–12.5)
TSH: 1.767 u[IU]/mL (ref 0.350–4.500)

## 2011-10-20 LAB — LIPID PANEL
HDL: 89 mg/dL (ref >39)
LDL Cholesterol: 95 mg/dL (ref 0–99)
Total CHOL/HDL Ratio: 2.2 Ratio
VLDL: 12 mg/dL (ref 0–40)

## 2011-10-20 LAB — COMPREHENSIVE METABOLIC PANEL
AST: 118 U/L — ABNORMAL HIGH (ref 0–37)
Alkaline Phosphatase: 83 U/L (ref 39–117)
BUN: 6 mg/dL (ref 6–23)
Creat: 0.56 mg/dL (ref 0.50–1.10)
Total Bilirubin: 0.6 mg/dL (ref 0.3–1.2)

## 2011-11-02 ENCOUNTER — Telehealth: Payer: Self-pay | Admitting: *Deleted

## 2011-11-02 NOTE — Telephone Encounter (Signed)
R'cd fax from pt with lab work attached for thyroid. Per SAE, pt is due for F/U OV. Last OV 10/2010 .  Pt informed to callback office to scheduled F/U OV via VM.

## 2011-11-08 ENCOUNTER — Telehealth: Payer: Self-pay | Admitting: Cardiovascular Disease

## 2011-11-08 NOTE — Telephone Encounter (Signed)
New problem:  C/o tackycardia, - rapid heart beat for 2 1/2 days , no er visit.   Took medication.

## 2011-11-08 NOTE — Telephone Encounter (Signed)
Pt calling wanting to know if dr Clifton James had responded to cheryl about fast heart rate--advised we had not received a message from dr Clifton James yet, that she could take an extra cadiazem, and to come in 9/11, at 9am for a nurse visit EKG--pt agrees

## 2011-11-08 NOTE — Telephone Encounter (Signed)
Patient called stated she has been having fast heart beat off and on since this past Sunday 11/06/11.States she does not drink caffeine.States takes cardizem 120 mg at night and could not remember if she can take a extra cardizem.Patient was told at her last office visit Dr.McAlhany advised may take a extra cardizem if needed for palpitations.Patient wanted to ask Dr.McAlhany about having a ablation.Message sent to Dr.McAlhany for advice.

## 2011-11-09 ENCOUNTER — Ambulatory Visit (INDEPENDENT_AMBULATORY_CARE_PROVIDER_SITE_OTHER): Payer: Federal, State, Local not specified - PPO | Admitting: *Deleted

## 2011-11-09 VITALS — BP 180/92 | HR 107 | Resp 20 | Ht 62.0 in | Wt 121.0 lb

## 2011-11-09 DIAGNOSIS — I498 Other specified cardiac arrhythmias: Secondary | ICD-10-CM

## 2011-11-09 NOTE — Progress Notes (Signed)
Terri Ayala here for EKG --voicing no c/o pain-does state SOB--very shaky--ekg done and shared with dr jordan--per dr jordan--pt in NSR --no orders from dr jordan--an appoint made with dr Clifton James and advised to go to nearest ED if condition worsened--also advised per dr Gibson Ramp encounter --may take an extra diltiazem if palpitations more frequent--pt agrees to plan

## 2011-11-09 NOTE — Telephone Encounter (Signed)
Thanks Harriett Sine. I read your EKG note. I think the current plan is a good one to take an extra cardizem if she feels palpitations. chris

## 2011-11-22 ENCOUNTER — Encounter: Payer: Self-pay | Admitting: Medical

## 2011-11-22 ENCOUNTER — Ambulatory Visit (INDEPENDENT_AMBULATORY_CARE_PROVIDER_SITE_OTHER): Payer: Federal, State, Local not specified - PPO | Admitting: Medical

## 2011-11-22 VITALS — BP 120/70 | HR 100 | Temp 98.5°F | Resp 18 | Wt 121.0 lb

## 2011-11-22 DIAGNOSIS — S61259A Open bite of unspecified finger without damage to nail, initial encounter: Secondary | ICD-10-CM

## 2011-11-22 DIAGNOSIS — Z23 Encounter for immunization: Secondary | ICD-10-CM

## 2011-11-22 DIAGNOSIS — S61209A Unspecified open wound of unspecified finger without damage to nail, initial encounter: Secondary | ICD-10-CM

## 2011-11-22 MED ORDER — AMOXICILLIN-POT CLAVULANATE 875-125 MG PO TABS: 1.0000 | ORAL_TABLET | Freq: Two times a day (BID) | ORAL | Status: DC

## 2011-11-22 NOTE — Progress Notes (Addendum)
Subjective: Here for c/o animal bite.  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  Thus, she handles animals regularly.  She recently released a rehabilitated opossum into the woods.  She [place] back in the woods.  She has outdoor cats and she went to grab the cat food bowl from her back porch 2 nights ago in the dark and when she grabbed the bowl, and opossum bit her on the right thumb.  She initially didn't see the opossum.  The opossum ran away quickly.  She is not sure if this was the rehabilitated opossum or not, but the opossum was not quaranteened.  She denies any recent oddly behaving animals around her home.  She immediately washed the wound with soap and water.  The next day it appeared red and swollen but looks a lot better today.  She is here for evaluation for possible infection.  She denies fever, NVD, no numbness, tingling, or additional swelling.    Past Medical History  Diagnosis Date  . Low back pain   . Anxiety   . Migraines   . GOITER, MULTINODULAR 08/17/2009  . OSTEOPOROSIS 08/17/2009  . ASYMPTOMATIC POSTMENOPAUSAL STATUS 08/17/2009  . DYSPHAGIA 06/19/2007  . Supraventricular tachycardia   . Hyperlipidemia   . Mitral valve prolapse    ROS as noted above  Objective: Gen: wd, wn Skin: right thumb at base with 2 small bite wounds, but no induration or erythema MSK: no swelling, right thumb normal ROM Neurovascularly intact  Assessment: Encounter Diagnoses  Name Primary?  . Animal bite of finger Yes  . Need for influenza vaccination     Plan: Td last updated 2008.  Discussed bite, concerns for infection.   Gave script for Augmentin.  Called [place] health Dept nurse and discussed any other recommendations.  There have been no reported rabies cases of opossums in [place], so risk thought to be low for rabies.  Thus no urgent need for rabies prophylaxis.   given  her line of work though, advised she get the rabies vaccine in general.  She will let me know if she wants referral for rabies vaccination.    Flu vaccine given along with VIS.  RTC prn.

## 2011-11-23 NOTE — Addendum Note (Signed)
Addended by: Leretha Dykes L on: 11/23/2011 11:19 AM   Modules accepted: Orders

## 2012-01-10 ENCOUNTER — Ambulatory Visit (INDEPENDENT_AMBULATORY_CARE_PROVIDER_SITE_OTHER): Payer: Federal, State, Local not specified - PPO | Admitting: Cardiovascular Disease

## 2012-01-10 ENCOUNTER — Encounter: Payer: Self-pay | Admitting: Cardiovascular Disease

## 2012-01-10 VITALS — BP 131/75 | HR 109 | Ht 62.0 in | Wt 123.0 lb

## 2012-01-10 DIAGNOSIS — I471 Supraventricular tachycardia: Secondary | ICD-10-CM

## 2012-01-10 DIAGNOSIS — R002 Palpitations: Secondary | ICD-10-CM

## 2012-01-10 DIAGNOSIS — I498 Other specified cardiac arrhythmias: Secondary | ICD-10-CM

## 2012-01-10 MED ORDER — DILTIAZEM HCL ER COATED BEADS 240 MG PO CP24: 240.0000 mg | ORAL_CAPSULE | Freq: Every day | ORAL | Status: DC

## 2012-01-10 NOTE — Patient Instructions (Signed)
Your physician wants you to follow-up in:  12 months. You will receive a reminder letter in the mail two months in advance. If you don't receive a letter, please call our office to schedule the follow-up appointment.  Your physician has recommended that you wear an event monitor. Event monitors are medical devices that record the heart's electrical activity. Doctors most often [place] these monitors to diagnose arrhythmias. Arrhythmias are problems with the speed or rhythm of the heartbeat. The monitor is a small, portable device. You can wear one while you do your normal daily activities. This is usually used to diagnose what is causing palpitations/syncope (passing out).   You have been referred to  Electrophysiology--Dr. Ladona Ridgel  Your physician has recommended you make the following change in your medication:  Increase Cardizem to 240 mg by mouth daily.

## 2012-01-10 NOTE — Progress Notes (Signed)
History of Present Illness: 56 yo white female with history of anxiety, migraine headaches, mitral valve prolapse here today for follow up. She was seen as a new patient for cardiac evaluation November 29, 2010. She was seen in the [facility] ED 11/14/10 with c/o palpitations. EKG with SVT with rate of 178 bpm. Adenosine 6 mg was given IV x 1 and she converted to normal sinus rhythm. Dr. Donnie Aho was on call and recommended atenolol 25 mg po once daily and f/u in [facility]. She did not tolerate the atenolol. I saw her last in November 2012 and had her wear a 21 day event monitor. Her event monitor showed sinus tachycardia with no evidence of atrial fibrillation, SVT or VT. She was seen by Tereso Newcomer, PA-C on 06/29/11 for evaluation of chest pain that was felt to be atypical. This was follow up for an ED visit for the same. A stress myoview was arranged on 06/30/11 and showed no evidence of ischemia with normal LV function. D-dimer and troponin were negative. Echo October 2012 with normal LV size and function. Thyroid profile normal per primary care in August 2013.   She is here today for follow up. She says she is not feeling well. She feels her heart racing every day.  This makes her dyspneic. She bought a heart rate monitor and her HR is always over 100. She does not use nicotine ,caffeine or stimulant drugs.  She is tolerating cardizem. No near syncope or syncope.   Primary Care Physician: Sharlot Gowda  Last Lipid Profile:Lipid Panel     Component Value Date/Time   CHOL 196 10/19/2011 1139   TRIG 61 10/19/2011 1139   HDL 89 10/19/2011 1139   CHOLHDL 2.2 10/19/2011 1139   VLDL 12 10/19/2011 1139   LDLCALC 95 10/19/2011 1139     Past Medical History  Diagnosis Date  . Low back pain   . Anxiety   . Migraines   . GOITER, MULTINODULAR 08/17/2009  . OSTEOPOROSIS 08/17/2009  . ASYMPTOMATIC POSTMENOPAUSAL STATUS 08/17/2009  . DYSPHAGIA 06/19/2007  . Supraventricular tachycardia   .  Hyperlipidemia   . Mitral valve prolapse     Past Surgical History  Procedure Date  . Laporoscopic abdominal surgery   . Cervix surgery     Current Outpatient Prescriptions  Medication Sig Dispense Refill  . alendronate (FOSAMAX) 70 MG tablet Take 70 mg by mouth every 7 (seven) days. Take with a full glass of water on an empty stomach.      . ALPRAZolam (XANAX) 0.5 MG tablet Take 1 tablet (0.5 mg total) by mouth daily as needed. For anxiety  90 tablet  3  . amoxicillin-clavulanate (AUGMENTIN) 875-125 MG per tablet Take 1 tablet by mouth 2 (two) times daily.  20 tablet  0  . aspirin 81 MG tablet Take 81 mg by mouth daily.        . calcium carbonate 200 MG capsule Take 250 mg by mouth daily.        . cholecalciferol (VITAMIN D) 1000 UNITS tablet Take 1,000 Units by mouth daily.        [name] Kitchen diltiazem (CARDIZEM CD) 120 MG 24 hr capsule Take 120 mg by mouth daily.      [name] Kitchen glucosamine-chondroitin 500-400 MG tablet Take 1 tablet by mouth daily.       . norethindrone-ethinyl estradiol (FEMHRT 1/5) 1-5 MG-MCG TABS Take 1 tablet by mouth daily.       . pantoprazole (PROTONIX) 40 MG  tablet Take 40 mg by mouth daily.      . simvastatin (ZOCOR) 20 MG tablet Take 20 mg by mouth at bedtime.      . SUMAtriptan (IMITREX) 100 MG tablet Take 100 mg by mouth every 2 (two) hours as needed. Maximum 300mg /24 hours        Allergies  Allergen Reactions  . Doxycycline Other (See Comments)    Reaction unknown  . Tetracycline Hcl Other (See Comments)    Reaction unknown    History   Social History  . Marital Status: Single    Spouse Name: N/A    Number of Children: N/A  . Years of Education: N/A   Occupational History  . Biologist    Social History Main Topics  . Smoking status: Never Smoker   . Smokeless tobacco: Never Used  . Alcohol Use: Yes  . Drug Use: Not on file  . Sexually Active: Not on file   Other Topics Concern  . Not on file   Social History Narrative  . No narrative on file     Family History  Problem Relation Age of Onset  . Thyroid disease Neg Hx   . Goiter Neg Hx     Review of Systems:  As stated in the HPI and otherwise negative.   BP 131/75  Pulse 109  Ht 5\' 2"  (1.575 m)  Wt 123 lb (55.792 kg)  BMI 22.50 kg/m2  Physical Examination: General: Well developed, well nourished, NAD HEENT: OP clear, mucus membranes moist SKIN: warm, dry. No rashes. Neuro: No focal deficits Musculoskeletal: Muscle strength 5/5 all ext Psychiatric: Mood and affect normal Neck: No JVD, no carotid bruits, no thyromegaly, no lymphadenopathy. Lungs:Clear bilaterally, no wheezes, rhonci, crackles Cardiovascular: Tachy, Regular rhythm. No murmurs, gallops or rubs. Abdomen:Soft. Bowel sounds present. Non-tender.  Extremities: No lower extremity edema. Pulses are 2 + in the bilateral DP/PT.  EKG: Sinus tachycardia, rate 109 bpm.   Stress myoview 06/30/11:  Stress Procedure: The patient performed treadmill exercise using a Bruce Protocol for 7:16 minutes. The patient stopped due to Dyspnea and denied any chest pain. There were no significant ST-T wave changes. Rare PVC noted. Technetium 75m Tetrofosmin was injected at peak exercise and myocardial perfusion imaging was performed after a brief delay.  Stress ECG: No significant change from baseline ECG  QPS  Raw Data Images: Normal; no motion artifact; normal heart/lung ratio.  Stress Images: Normal homogeneous uptake in all areas of the myocardium.  Rest Images: Normal homogeneous uptake in all areas of the myocardium.  Subtraction (SDS): No evidence of ischemia.  Transient Ischemic Dilatation (Normal <1.22): 0.93  Lung/Heart Ratio (Normal <0.45): 0.32  Quantitative Gated Spect Images  QGS EDV: 61 ml  QGS ESV: 12 ml  Impression  Exercise Capacity: Good exercise capacity.  BP Response: Normal blood pressure response.  Clinical Symptoms: No chest pain.  ECG Impression: No significant ST segment change suggestive of  ischemia.  Comparison with Prior Nuclear Study: No images to compare  Overall Impression: Normal stress nuclear study.  LV Ejection Fraction: 81%. LV Wall Motion: NL LV Function; NL Wall Motion   Echo 12/07/10:  Left ventricle: The cavity size was normal. Wall thickness was increased in a pattern of mild LVH. There was mild focal basal hypertrophy of the septum. Systolic function was normal. The estimated ejection fraction was in the range of 60% to 65%. Wall motion was normal; there were no regional wall motion abnormalities. Features are consistent with a pseudonormal  left ventricular filling pattern, with concomitant abnormal relaxation and increased filling pressure (grade 2 diastolic dysfunction).   Assessment and Plan:   1. Tachycardia: She has documented SVT last year in the ED. She wore a 21 day event monitor in 2012 with no evidence of SVT, VT or atrial fibrillation. Still having symptoms on Cardizem. Will increase Cardizem to 240 mg po Qdaily. She avoids caffeine, nicotine, stimulants. She is frustrated with how she feels and wishes to have EP referral. Will have her wear a 21 day event monitor to exclude recurrence of SVT. Referral to Dr. Ladona Ridgel with EP.   2. Chest pain: Resolved. Stress test without ischemia. No further ischemic testing.

## 2012-01-16 ENCOUNTER — Encounter (INDEPENDENT_AMBULATORY_CARE_PROVIDER_SITE_OTHER): Payer: Federal, State, Local not specified - PPO

## 2012-01-16 DIAGNOSIS — R002 Palpitations: Secondary | ICD-10-CM

## 2012-02-06 ENCOUNTER — Ambulatory Visit: Payer: Federal, State, Local not specified - PPO | Admitting: Endocrinology

## 2012-02-07 ENCOUNTER — Ambulatory Visit: Payer: Federal, State, Local not specified - PPO | Admitting: Internal Medicine

## 2012-02-09 ENCOUNTER — Telehealth: Payer: Self-pay | Admitting: *Deleted

## 2012-02-09 ENCOUNTER — Telehealth: Payer: Self-pay | Admitting: Family Medicine

## 2012-02-09 MED ORDER — SUMATRIPTAN SUCCINATE 100 MG PO TABS: 100.0000 mg | ORAL_TABLET | ORAL | Status: DC | PRN

## 2012-02-09 NOTE — Telephone Encounter (Signed)
Imitrex was renewed

## 2012-02-09 NOTE — Telephone Encounter (Signed)
I called pt to review monitor results. Left message to call back 

## 2012-02-13 ENCOUNTER — Ambulatory Visit: Payer: Federal, State, Local not specified - PPO | Admitting: Endocrinology

## 2012-02-16 NOTE — Telephone Encounter (Signed)
Left message to call back. Pt has appt with Dr. Ladona Ridgel on March 01, 2012

## 2012-02-17 NOTE — Telephone Encounter (Signed)
Pt is aware of January appt with Dr. Ladona Ridgel. She did not know if there was anything else Pat needed to tell her. I told her I would send Dennie Bible this message and if she had further message to give she would return call back to her. Pt agrees with this  Mylo Red RN

## 2012-02-17 NOTE — Telephone Encounter (Signed)
Follow-up:     Patient returned Pat's call.  Please call back.

## 2012-03-01 ENCOUNTER — Encounter: Payer: Self-pay | Admitting: Internal Medicine

## 2012-03-01 ENCOUNTER — Ambulatory Visit (INDEPENDENT_AMBULATORY_CARE_PROVIDER_SITE_OTHER): Payer: Federal, State, Local not specified - PPO | Admitting: Internal Medicine

## 2012-03-01 VITALS — BP 136/82 | HR 110 | Wt 127.0 lb

## 2012-03-01 DIAGNOSIS — I471 Supraventricular tachycardia: Secondary | ICD-10-CM

## 2012-03-01 DIAGNOSIS — I498 Other specified cardiac arrhythmias: Secondary | ICD-10-CM

## 2012-03-01 NOTE — Assessment & Plan Note (Signed)
The etiology of her chest discomfort is unclear. I will defer any additional evaluation to her primary cardiologist.

## 2012-03-01 NOTE — Progress Notes (Signed)
HPI Terri Ayala is referred today by Dr. Sanjuana Kava for evaluation of SVT and palpitations. She is a pleasant 57 yo woman who presented to the ER several months ago with SVT at 180/min. She has been on calcium channel blockers since then. She were cardiac monitor and had no significant arrhythmias. The patient notes palpitations that are intermittent. Sometimes they're related to exertion, at other times certain positions, and at other times there is no clear reason for him. She has not had syncope. She does have weakness and fatigue, and carries a history of mitral valve prolapse. She also has modest arthritis. Allergies  Allergen Reactions  . Doxycycline Other (See Comments)    Reaction unknown  . Tetracycline Hcl Other (See Comments)    Reaction unknown     Current Outpatient Prescriptions  Medication Sig Dispense Refill  . alendronate (FOSAMAX) 70 MG tablet Take 70 mg by mouth every 7 (seven) days. Take with a full glass of water on an empty stomach.      . ALPRAZolam (XANAX) 0.5 MG tablet Take 1 tablet (0.5 mg total) by mouth daily as needed. For anxiety  90 tablet  3  . aspirin 81 MG tablet Take 81 mg by mouth daily.        . calcium carbonate 200 MG capsule Take 250 mg by mouth daily.        . cholecalciferol (VITAMIN D) 1000 UNITS tablet Take 1,000 Units by mouth daily.        [name] Kitchen diltiazem (CARDIZEM CD) 240 MG 24 hr capsule Take 1 capsule (240 mg total) by mouth daily.  30 capsule  11  . glucosamine-chondroitin 500-400 MG tablet Take 1 tablet by mouth daily.       . norethindrone-ethinyl estradiol (FEMHRT 1/5) 1-5 MG-MCG TABS Take 1 tablet by mouth daily.       . pantoprazole (PROTONIX) 40 MG tablet Take 40 mg by mouth daily.      . simvastatin (ZOCOR) 20 MG tablet Take 20 mg by mouth at bedtime.      . SUMAtriptan (IMITREX) 100 MG tablet Take 1 tablet (100 mg total) by mouth every 2 (two) hours as needed.  10 tablet  5     Past Medical History  Diagnosis Date  . Low back pain   .  Anxiety   . Migraines   . GOITER, MULTINODULAR 08/17/2009  . OSTEOPOROSIS 08/17/2009  . ASYMPTOMATIC POSTMENOPAUSAL STATUS 08/17/2009  . DYSPHAGIA 06/19/2007  . Supraventricular tachycardia   . Hyperlipidemia   . Mitral valve prolapse     ROS:   All systems reviewed and negative except as noted in the HPI.   Past Surgical History  Procedure Date  . Laporoscopic abdominal surgery   . Cervix surgery      Family History  Problem Relation Age of Onset  . Thyroid disease Neg Hx   . Goiter Neg Hx      History   Social History  . Marital Status: Single    Spouse Name: N/A    Number of Children: N/A  . Years of Education: N/A   Occupational History  . Biologist    Social History Main Topics  . Smoking status: Never Smoker   . Smokeless tobacco: Never Used  . Alcohol Use: Yes  . Drug Use: Not on file  . Sexually Active: Not on file   Other Topics Concern  . Not on file   Social History Narrative  . No narrative on file  BP 136/82  Pulse 110  Wt 127 lb (57.607 kg)  Physical Exam:  Well appearing middle-aged woman, NAD HEENT: Unremarkable Neck:  7 cm JVD, no thyromegally Lungs:  Clear with no wheezes, rales, or rhonchi. HEART:  Regular rate rhythm, no murmurs, no rubs, no clicks Abd:  soft, positive bowel sounds, no organomegally, no rebound, no guarding Ext:  2 plus pulses, no edema, no cyanosis, no clubbing Skin:  No rashes no nodules Neuro:  CN II through XII intact, motor grossly intact  EKG Normal sinus rhythm with normal axis and intervals.  Assess/Plan:

## 2012-03-01 NOTE — Assessment & Plan Note (Signed)
We discussed the etiology of the patient's SVT, as well as the palpitations that she experiences, as well as the etiology. She clearly has SVT, likely AV node reentrant tachycardia. The patient's symptoms are fairly well-controlled with regard to her SVT. She does have brief episodes. I suspect she also has PACs and PVCs although a recent cardiac monitor did not show much of either. I discussed the risk, benefits, goals, and expectations of catheter ablation of SVT with the patient. She is considering her options and will call us if she wishes to proceed with ablation. I specifically counseled her that a successful ablation might not still eliminate all of her symptoms. The reason for this was also discussed.

## 2012-03-01 NOTE — Patient Instructions (Signed)
Your physician has recommended that you have an ablation. Catheter ablation is a medical procedure used to treat some cardiac arrhythmias (irregular heartbeats). During catheter ablation, a long, thin, flexible tube is put into a blood vessel in your groin (upper thigh), or neck. This tube is called an ablation catheter. It is then guided to your heart through the blood vessel. Radio frequency waves destroy small areas of heart tissue where abnormal heartbeats may cause an arrhythmia to start. Please see the instruction sheet given to you today.   Will call if wants to schedule ablation

## 2012-03-06 ENCOUNTER — Ambulatory Visit: Payer: Federal, State, Local not specified - PPO | Admitting: Endocrinology

## 2012-07-11 ENCOUNTER — Telehealth: Payer: Self-pay | Admitting: Family Medicine

## 2012-07-11 ENCOUNTER — Other Ambulatory Visit: Payer: Self-pay | Admitting: Medical

## 2012-07-11 DIAGNOSIS — F411 Generalized anxiety disorder: Secondary | ICD-10-CM

## 2012-07-11 MED ORDER — ALPRAZOLAM 0.5 MG PO TABS: 0.5000 mg | ORAL_TABLET | Freq: Every day | ORAL | Status: DC | PRN

## 2012-07-11 NOTE — Telephone Encounter (Signed)
Terri Ayala, I did call her pharmacy and they did say that the Rx was only good for 6 months so it has expired. He told me that she did get 2 of the refills. CLS

## 2012-07-11 NOTE — Telephone Encounter (Signed)
Charts shows that she takes Xanax, but label says once daily.  This was last refilled in 09/2011 for 90 with 3 refills which should last a whole year if she is taking once daily.      Please inquire about the refill request.

## 2012-07-11 NOTE — Telephone Encounter (Signed)
Patient states that yes she has a refill there but the pharmacy told her that the Rx expires after 6 months so she is unable to get the refills. CLS

## 2012-07-12 ENCOUNTER — Other Ambulatory Visit: Payer: Self-pay | Admitting: *Deleted

## 2012-07-12 NOTE — Progress Notes (Signed)
Done

## 2012-07-13 NOTE — Telephone Encounter (Signed)
i sent msg to pool for xanax to be called out, can't tell if it was done

## 2012-07-13 NOTE — Telephone Encounter (Signed)
I called the pharmacy and the medication was called out. CLS

## 2012-08-21 ENCOUNTER — Other Ambulatory Visit: Payer: Self-pay | Admitting: Dermatology

## 2012-09-26 ENCOUNTER — Other Ambulatory Visit: Payer: Self-pay

## 2012-09-26 DIAGNOSIS — Z1231 Encounter for screening mammogram for malignant neoplasm of breast: Secondary | ICD-10-CM

## 2012-10-04 ENCOUNTER — Ambulatory Visit (INDEPENDENT_AMBULATORY_CARE_PROVIDER_SITE_OTHER): Payer: Federal, State, Local not specified - PPO | Admitting: Family Medicine

## 2012-10-04 ENCOUNTER — Other Ambulatory Visit: Payer: Self-pay

## 2012-10-04 ENCOUNTER — Encounter: Payer: Self-pay | Admitting: Family Medicine

## 2012-10-04 VITALS — BP 118/68 | HR 80 | Wt 125.0 lb

## 2012-10-04 DIAGNOSIS — R1319 Other dysphagia: Secondary | ICD-10-CM

## 2012-10-04 DIAGNOSIS — Z79899 Other long term (current) drug therapy: Secondary | ICD-10-CM

## 2012-10-04 DIAGNOSIS — F411 Generalized anxiety disorder: Secondary | ICD-10-CM

## 2012-10-04 DIAGNOSIS — M810 Age-related osteoporosis without current pathological fracture: Secondary | ICD-10-CM

## 2012-10-04 DIAGNOSIS — R002 Palpitations: Secondary | ICD-10-CM

## 2012-10-04 DIAGNOSIS — H9193 Unspecified hearing loss, bilateral: Secondary | ICD-10-CM

## 2012-10-04 DIAGNOSIS — M129 Arthropathy, unspecified: Secondary | ICD-10-CM

## 2012-10-04 DIAGNOSIS — M1990 Unspecified osteoarthritis, unspecified site: Secondary | ICD-10-CM

## 2012-10-04 DIAGNOSIS — E785 Hyperlipidemia, unspecified: Secondary | ICD-10-CM

## 2012-10-04 DIAGNOSIS — E042 Nontoxic multinodular goiter: Secondary | ICD-10-CM

## 2012-10-04 DIAGNOSIS — H9190 Unspecified hearing loss, unspecified ear: Secondary | ICD-10-CM

## 2012-10-04 LAB — CBC WITH DIFFERENTIAL/PLATELET
Eosinophils Absolute: 0 10*3/uL (ref 0.0–0.7)
Eosinophils Relative: 0 % (ref 0–5)
Hemoglobin: 11.7 g/dL — ABNORMAL LOW (ref 12.0–15.0)
Lymphs Abs: 1.1 10*3/uL (ref 0.7–4.0)
MCH: 33.5 pg (ref 26.0–34.0)
MCV: 97.7 fL (ref 78.0–100.0)
Monocytes Relative: 9 % (ref 3–12)
RBC: 3.49 MIL/uL — ABNORMAL LOW (ref 3.87–5.11)

## 2012-10-04 MED ORDER — PANTOPRAZOLE SODIUM 40 MG PO TBEC: 40.0000 mg | DELAYED_RELEASE_TABLET | Freq: Every day | ORAL | Status: DC

## 2012-10-04 MED ORDER — ALPRAZOLAM 0.5 MG PO TABS: 0.5000 mg | ORAL_TABLET | Freq: Every day | ORAL | Status: DC | PRN

## 2012-10-04 MED ORDER — DILTIAZEM HCL ER COATED BEADS 240 MG PO CP24: 240.0000 mg | ORAL_CAPSULE | Freq: Every day | ORAL | Status: DC

## 2012-10-04 MED ORDER — ALENDRONATE SODIUM 70 MG PO TABS: 70.0000 mg | ORAL_TABLET | ORAL | Status: DC

## 2012-10-04 MED ORDER — SIMVASTATIN 20 MG PO TABS: 20.0000 mg | ORAL_TABLET | Freq: Every day | ORAL | Status: DC

## 2012-10-04 NOTE — Telephone Encounter (Signed)
CALLED XANAX IN 

## 2012-10-04 NOTE — Progress Notes (Signed)
  Subjective:    Patient ID: Terri Ayala, female    DOB: Sep 16, 1955, 57 y.o.   MRN: [id number]  HPI She is here for multiple issues. She has a previous history of difficulty with neck pain and has had an MRI. She was given physical therapy which has been quite beneficial however recently she has noted pain with the extremes of motion but no numbness tingling or weakness. She also is having difficulty with nausea the last year. Tends to bother her more in the afternoon. She does note that stress plays a role in this. She cannot relate this to any particular foods. She has noted this is now occurring daily. She continues on Protonix. She has had a previous EGD. He has been on Fosamax for roughly 10 years and her last DEXA was over 2 years ago. She has a history of difficulty with dysphagia and has been using Protonix with good results but this has also not helped her nausea. She also has a history of multinodular goiter. She does have bilateral hearing loss and is using hearing aids with good results. Review of Systems     Objective:   Physical Exam alert and in no distress. Tympanic membranes and canals are normal. Throat is clear. Tonsils are normal. Neck is supple without adenopathy or thyromegaly. Cardiac exam shows a regular sinus rhythm without murmurs or gallops. Lungs are clear to auscultation. Full motion of the neck but pain at the extremes of motion in all directions. Normal motor sensory and DTRs.       Assessment & Plan:  Bilateral hearing loss  ANXIETY  GOITER, MULTINODULAR - Plan: TSH  OSTEOPOROSIS - Plan: DG Cervical Spine Complete  DYSPHAGIA  Hyperlipidemia LDL goal < 130 - Plan: CBC with Differential, Comprehensive metabolic panel, Lipid panel  Arthritis - Plan: DG Cervical Spine Complete  Encounter for long-term (current) use of other medications - Plan: CBC with Differential, Comprehensive metabolic panel  discussed her anxiety in detail. I will continue her on  her Xanax to refer for counseling. Discussed the fact that she is allowing work and the people at work to control her emotions. She understands this and is willing to work on it. Discussed her GI symptoms I told her that I thought some of this was doubly psychological in order to and since her GI symptoms tend to occur more in the afternoon which she has just finished work. Recommend she continue with stretching exercises for her neck. 45 minutes spent discussing all these issues with her.

## 2012-10-04 NOTE — Patient Instructions (Signed)
Called Terri Ayala [phone] 

## 2012-10-05 LAB — COMPREHENSIVE METABOLIC PANEL
CO2: 25 mEq/L (ref 19–32)
Creat: 0.51 mg/dL (ref 0.50–1.10)
Glucose, Bld: 80 mg/dL (ref 70–99)
Total Bilirubin: 0.7 mg/dL (ref 0.3–1.2)
Total Protein: 7 g/dL (ref 6.0–8.3)

## 2012-10-05 LAB — LIPID PANEL
Cholesterol: 164 mg/dL (ref 0–200)
Triglycerides: 52 mg/dL (ref <150)
VLDL: 10 mg/dL (ref 0–40)

## 2012-10-08 ENCOUNTER — Other Ambulatory Visit: Payer: Self-pay

## 2012-10-08 DIAGNOSIS — R7989 Other specified abnormal findings of blood chemistry: Secondary | ICD-10-CM

## 2012-10-08 NOTE — Progress Notes (Signed)
Quick Note:  Called pt cell # left word for word message Make sure that she is on a multivit with Fe and recheck the CBC in one month i have put orders in system ______

## 2012-10-08 NOTE — Progress Notes (Signed)
Quick Note:  Make sure that she is on a multivit with Fe and recheck the CBC in one month ______

## 2012-10-08 NOTE — Progress Notes (Signed)
Quick Note:  SHE HAD HER LAST COLON. 07/05/07 ______

## 2012-10-09 ENCOUNTER — Ambulatory Visit
Admission: RE | Admit: 2012-10-09 | Discharge: 2012-10-09 | Disposition: A | Discharge status: Home / Self Care | Payer: Federal, State, Local not specified - PPO | Source: Ambulatory Visit | Attending: Family Medicine | Admitting: Family Medicine

## 2012-10-09 DIAGNOSIS — M810 Age-related osteoporosis without current pathological fracture: Secondary | ICD-10-CM

## 2012-10-09 DIAGNOSIS — M1990 Unspecified osteoarthritis, unspecified site: Secondary | ICD-10-CM

## 2012-10-09 NOTE — Progress Notes (Signed)
Quick Note:  Called pt cell# left message for pt to call and make appointment to discuss x ray further  ______

## 2012-10-11 ENCOUNTER — Ambulatory Visit (INDEPENDENT_AMBULATORY_CARE_PROVIDER_SITE_OTHER): Payer: Federal, State, Local not specified - PPO | Admitting: Family Medicine

## 2012-10-11 VITALS — Wt 126.0 lb

## 2012-10-11 DIAGNOSIS — M47812 Spondylosis without myelopathy or radiculopathy, cervical region: Secondary | ICD-10-CM

## 2012-10-11 NOTE — Progress Notes (Signed)
  Subjective:    Patient ID: Terri Ayala, female    DOB: [birth year], 57 y.o.   MRN: [id number]  HPI She is here for recheck. Recent x-ray did show spondylosis as well as foraminal narrowing mainly on the left. She states she has pain on motion of her neck in all directions but no numbness, tingling or weakness.   Review of Systems     Objective:   Physical Exam Limited range of motion in all directions. No point tenderness noted. Normal motor, sensory and DTRs.       Assessment & Plan:  Cervical arthritis  I explained that there was really no cure for this and it was a matter of taking care of the pain and ensuring as much range of motion as possible. Recommended heat and stretching after that as well as the use of Tylenol progressing to an NSAID. All her questions were answered.

## 2012-10-11 NOTE — Patient Instructions (Signed)

## 2012-10-12 DIAGNOSIS — M47812 Spondylosis without myelopathy or radiculopathy, cervical region: Secondary | ICD-10-CM | POA: Insufficient documentation

## 2012-10-15 ENCOUNTER — Ambulatory Visit: Payer: Federal, State, Local not specified - PPO | Admitting: Endocrinology

## 2012-10-15 ENCOUNTER — Ambulatory Visit
Admission: RE | Admit: 2012-10-15 | Discharge: 2012-10-15 | Disposition: A | Discharge status: Home / Self Care | Payer: Federal, State, Local not specified - PPO | Source: Ambulatory Visit

## 2012-10-15 DIAGNOSIS — Z1231 Encounter for screening mammogram for malignant neoplasm of breast: Secondary | ICD-10-CM

## 2012-10-26 ENCOUNTER — Encounter: Payer: Self-pay | Admitting: Endocrinology

## 2012-10-26 ENCOUNTER — Ambulatory Visit (INDEPENDENT_AMBULATORY_CARE_PROVIDER_SITE_OTHER): Payer: Federal, State, Local not specified - PPO | Admitting: Endocrinology

## 2012-10-26 VITALS — BP 136/70 | HR 100 | Ht 62.0 in | Wt 130.0 lb

## 2012-10-26 DIAGNOSIS — E042 Nontoxic multinodular goiter: Secondary | ICD-10-CM

## 2012-10-26 NOTE — Progress Notes (Signed)
Subjective:    Patient ID: Terri Ayala, female    DOB: 1955-12-30, 57 y.o.   MRN: [id number]  HPI Pt returns for f/u of few years of a slight multinodular goiter in the neck.  No assoc hyperthyroidism. she was also seen there with same problem in approx 2007.  She does not notice the goiter.   Past Medical History  Diagnosis Date  . Low back pain   . Anxiety   . Migraines   . GOITER, MULTINODULAR 08/17/2009  . OSTEOPOROSIS 08/17/2009  . ASYMPTOMATIC POSTMENOPAUSAL STATUS 08/17/2009  . DYSPHAGIA 06/19/2007  . Supraventricular tachycardia   . Hyperlipidemia   . Mitral valve prolapse     Past Surgical History  Procedure Laterality Date  . Laporoscopic abdominal surgery    . Cervix surgery      History   Social History  . Marital Status: Single    Spouse Name: N/A    Number of Children: N/A  . Years of Education: N/A   Occupational History  . Biologist    Social History Main Topics  . Smoking status: Never Smoker   . Smokeless tobacco: Never Used  . Alcohol Use: Yes  . Drug Use: Not on file  . Sexual Activity: Not on file   Other Topics Concern  . Not on file   Social History Narrative  . No narrative on file    Current Outpatient Prescriptions on File Prior to Visit  Medication Sig Dispense Refill  . alendronate (FOSAMAX) 70 MG tablet Take 1 tablet (70 mg total) by mouth every 7 (seven) days. Take with a full glass of water on an empty stomach.  4 tablet  11  . ALPRAZolam (XANAX) 0.5 MG tablet Take 1 tablet (0.5 mg total) by mouth daily as needed. For anxiety  90 tablet  1  . aspirin 81 MG tablet Take 81 mg by mouth daily.        . calcium carbonate 200 MG capsule Take 250 mg by mouth daily.        . cholecalciferol (VITAMIN D) 1000 UNITS tablet Take 1,000 Units by mouth daily.        [name] Kitchen diltiazem (CARDIZEM CD) 240 MG 24 hr capsule Take 1 capsule (240 mg total) by mouth daily.  30 capsule  11  . glucosamine-chondroitin 500-400 MG tablet Take 1 tablet by mouth  daily.       . norethindrone-ethinyl estradiol (FEMHRT 1/5) 1-5 MG-MCG TABS Take 1 tablet by mouth daily.       . pantoprazole (PROTONIX) 40 MG tablet Take 1 tablet (40 mg total) by mouth daily.  30 tablet  11  . simvastatin (ZOCOR) 20 MG tablet Take 1 tablet (20 mg total) by mouth at bedtime.  30 tablet  11  . SUMAtriptan (IMITREX) 100 MG tablet Take 1 tablet (100 mg total) by mouth every 2 (two) hours as needed.  10 tablet  5   No current facility-administered medications on file prior to visit.    Allergies  Allergen Reactions  . Doxycycline Other (See Comments)    Reaction unknown  . Tetracycline Hcl Other (See Comments)    Reaction unknown    Family History  Problem Relation Age of Onset  . Thyroid disease Neg Hx   . Goiter Neg Hx    BP 136/70  Pulse 100  Ht 5\' 2"  (1.575 m)  Wt 130 lb (58.968 kg)  BMI 23.77 kg/m2  SpO2 97%  Review of Systems She has gained  weight.      Objective:   Physical Exam VITAL SIGNS:  See vs page GENERAL: no distress Neck: thyroid is slightly enlarged, with irregular surface.  i am unable to appreciate any nodule.     Assessment & Plan:  Multinodular goiter, hereditary.

## 2012-10-26 NOTE — Patient Instructions (Addendum)
Let's recheck the ultrasound.  you will receive a phone call, about a day and time for an appointment Please return in 2 years. 

## 2012-10-31 ENCOUNTER — Ambulatory Visit (INDEPENDENT_AMBULATORY_CARE_PROVIDER_SITE_OTHER): Payer: Federal, State, Local not specified - PPO | Admitting: Family Medicine

## 2012-10-31 ENCOUNTER — Encounter: Payer: Self-pay | Admitting: Family Medicine

## 2012-10-31 VITALS — BP 122/70 | Wt 128.0 lb

## 2012-10-31 DIAGNOSIS — R6881 Early satiety: Secondary | ICD-10-CM

## 2012-10-31 DIAGNOSIS — R748 Abnormal levels of other serum enzymes: Secondary | ICD-10-CM

## 2012-10-31 LAB — COMPREHENSIVE METABOLIC PANEL
ALT: 22 U/L (ref 0–35)
AST: 76 U/L — ABNORMAL HIGH (ref 0–37)
Albumin: 4.6 g/dL (ref 3.5–5.2)
Alkaline Phosphatase: 101 U/L (ref 39–117)
Calcium: 9.2 mg/dL (ref 8.4–10.5)
Chloride: 102 mEq/L (ref 96–112)
Potassium: 4.4 mEq/L (ref 3.5–5.3)
Sodium: 137 mEq/L (ref 135–145)
Total Protein: 7.1 g/dL (ref 6.0–8.3)

## 2012-10-31 LAB — CBC WITH DIFFERENTIAL/PLATELET
Basophils Absolute: 0 10*3/uL (ref 0.0–0.1)
Basophils Relative: 0 % (ref 0–1)
HCT: 35.3 % — ABNORMAL LOW (ref 36.0–46.0)
Lymphocytes Relative: 20 % (ref 12–46)
MCHC: 35.7 g/dL (ref 30.0–36.0)
Monocytes Absolute: 0.5 10*3/uL (ref 0.1–1.0)
Neutro Abs: 4.1 10*3/uL (ref 1.7–7.7)
Neutrophils Relative %: 70 % (ref 43–77)
Platelets: 176 10*3/uL (ref 150–400)
RDW: 13.7 % (ref 11.5–15.5)
WBC: 6 10*3/uL (ref 4.0–10.5)

## 2012-10-31 NOTE — Progress Notes (Signed)
  Subjective:    Patient ID: Terri Ayala, female    DOB: 1955-12-10, 57 y.o.   MRN: [id number]  HPI She complains of a three-week history of difficulty with early satiety and a feeling of bloating. The bloating does not change with foods. She's had no nausea but no vomiting or trouble with her bowel movements. Her weight has been stable. She did try Pepto-Bismol one time. Review of record indicates a slightly elevated liver enzyme as well as hemoglobin is slightly below 12.   Review of Systems     Objective:   Physical Exam alert and in no distress. Tympanic membranes and canals are normal. Throat is clear. Tonsils are normal. Neck is supple without adenopathy or thyromegaly. Cardiac exam shows a regular sinus rhythm without murmurs or gallops. Lungs are clear to auscultation. Abdominal exam shows high-pitched tinkling bowel sounds with no masses or tenderness.        Assessment & Plan:  Early satiety - Plan: CBC with Differential, Comprehensive metabolic panel  Abnormal liver enzymes - Plan: CBC with Differential, Comprehensive metabolic panel  possible GI referral pending results of lab data Recommend she try Prilosec 40 mg daily

## 2012-10-31 NOTE — Patient Instructions (Signed)
2 Prilosec daily for right now

## 2012-11-01 NOTE — Progress Notes (Signed)
Quick Note:  CALLED PT HOME # LEFT WORD FOR WORD MESSAGE ON MACHINEthe blood work looks good. Have her use the Prilosec at 40 mg for the next week and if still having symptoms, have her call for referral to GI ______

## 2012-11-02 ENCOUNTER — Other Ambulatory Visit: Payer: Federal, State, Local not specified - PPO

## 2012-11-05 ENCOUNTER — Other Ambulatory Visit: Payer: Federal, State, Local not specified - PPO

## 2012-11-13 ENCOUNTER — Other Ambulatory Visit: Payer: Federal, State, Local not specified - PPO

## 2012-11-17 ENCOUNTER — Other Ambulatory Visit: Payer: Self-pay | Admitting: Family Medicine

## 2012-11-21 ENCOUNTER — Ambulatory Visit
Admission: RE | Admit: 2012-11-21 | Discharge: 2012-11-21 | Disposition: A | Discharge status: Home / Self Care | Payer: Federal, State, Local not specified - PPO | Source: Ambulatory Visit | Attending: Endocrinology | Admitting: Endocrinology

## 2012-11-21 DIAGNOSIS — E042 Nontoxic multinodular goiter: Secondary | ICD-10-CM

## 2012-12-18 ENCOUNTER — Other Ambulatory Visit: Payer: Self-pay | Admitting: Gastroenterology

## 2012-12-18 DIAGNOSIS — R110 Nausea: Secondary | ICD-10-CM

## 2012-12-27 ENCOUNTER — Encounter: Payer: Self-pay | Admitting: Internal Medicine

## 2012-12-27 ENCOUNTER — Encounter (HOSPITAL_COMMUNITY)
Admission: RE | Admit: 2012-12-27 | Discharge: 2012-12-27 | Disposition: A | Discharge status: Home / Self Care | Payer: Federal, State, Local not specified - PPO | Source: Ambulatory Visit | Attending: Gastroenterology | Admitting: Gastroenterology

## 2012-12-27 DIAGNOSIS — R110 Nausea: Secondary | ICD-10-CM

## 2012-12-27 MED ORDER — TECHNETIUM TC 99M SULFUR COLLOID
2.0000 | Freq: Once | INTRAVENOUS | Status: AC | PRN
  Administered 2012-12-27: 2 via INTRAVENOUS

## 2012-12-28 ENCOUNTER — Ambulatory Visit (HOSPITAL_COMMUNITY): Payer: Federal, State, Local not specified - PPO

## 2013-01-14 ENCOUNTER — Other Ambulatory Visit: Payer: Self-pay | Admitting: Family Medicine

## 2013-01-29 ENCOUNTER — Emergency Department (INDEPENDENT_AMBULATORY_CARE_PROVIDER_SITE_OTHER)
Admission: EM | Admit: 2013-01-29 | Discharge: 2013-01-29 | Disposition: A | Discharge status: Home / Self Care | Payer: Federal, State, Local not specified - PPO | Source: Home / Self Care | Attending: Family Medicine | Admitting: Family Medicine

## 2013-01-29 ENCOUNTER — Other Ambulatory Visit: Payer: Self-pay | Admitting: Gastroenterology

## 2013-01-29 ENCOUNTER — Ambulatory Visit
Admission: RE | Admit: 2013-01-29 | Discharge: 2013-01-29 | Disposition: A | Discharge status: Home / Self Care | Payer: Federal, State, Local not specified - PPO | Source: Ambulatory Visit | Attending: Gastroenterology | Admitting: Gastroenterology

## 2013-01-29 ENCOUNTER — Encounter (HOSPITAL_COMMUNITY): Payer: Self-pay | Admitting: Emergency Medicine

## 2013-01-29 ENCOUNTER — Telehealth: Payer: Self-pay | Admitting: Family Medicine

## 2013-01-29 DIAGNOSIS — R7401 Elevation of levels of liver transaminase levels: Secondary | ICD-10-CM

## 2013-01-29 DIAGNOSIS — R112 Nausea with vomiting, unspecified: Secondary | ICD-10-CM

## 2013-01-29 DIAGNOSIS — R197 Diarrhea, unspecified: Secondary | ICD-10-CM

## 2013-01-29 LAB — CBC WITH DIFFERENTIAL/PLATELET
Eosinophils Absolute: 0 10*3/uL (ref 0.0–0.7)
HCT: 38 % (ref 36.0–46.0)
Hemoglobin: 13.1 g/dL (ref 12.0–15.0)
Lymphs Abs: 0.5 10*3/uL — ABNORMAL LOW (ref 0.7–4.0)
MCH: 35.4 pg — ABNORMAL HIGH (ref 26.0–34.0)
MCHC: 34.5 g/dL (ref 30.0–36.0)
Monocytes Absolute: 0.4 10*3/uL (ref 0.1–1.0)
Monocytes Relative: 5 % (ref 3–12)
Neutro Abs: 6.4 10*3/uL (ref 1.7–7.7)
Neutrophils Relative %: 87 % — ABNORMAL HIGH (ref 43–77)
Platelets: 189 10*3/uL (ref 150–400)
RBC: 3.7 MIL/uL — ABNORMAL LOW (ref 3.87–5.11)
WBC: 7.4 10*3/uL (ref 4.0–10.5)

## 2013-01-29 LAB — COMPREHENSIVE METABOLIC PANEL
ALT: 63 U/L — ABNORMAL HIGH (ref 0–35)
Alkaline Phosphatase: 385 U/L — ABNORMAL HIGH (ref 39–117)
CO2: 20 mEq/L (ref 19–32)
Chloride: 94 mEq/L — ABNORMAL LOW (ref 96–112)
GFR calc Af Amer: >90 mL/min (ref >90)
GFR calc non Af Amer: >90 mL/min (ref >90)
Glucose, Bld: 79 mg/dL (ref 70–99)
Potassium: 4 mEq/L (ref 3.5–5.1)
Sodium: 137 mEq/L (ref 135–145)

## 2013-01-29 LAB — POCT I-STAT, CHEM 8
Calcium, Ion: 1.05 mmol/L — ABNORMAL LOW (ref 1.12–1.23)
Creatinine, Ser: 0.8 mg/dL (ref 0.50–1.10)
HCT: 41 % (ref 36.0–46.0)
Hemoglobin: 13.9 g/dL (ref 12.0–15.0)
TCO2: 21 mmol/L (ref 0–100)

## 2013-01-29 LAB — LIPASE, BLOOD: Lipase: 33 U/L (ref 11–59)

## 2013-01-29 MED ORDER — IOHEXOL 300 MG/ML  SOLN
100.0000 mL | Freq: Once | INTRAMUSCULAR | Status: AC | PRN
  Administered 2013-01-29: 100 mL via INTRAVENOUS

## 2013-01-29 MED ORDER — METOCLOPRAMIDE HCL 5 MG/ML IJ SOLN
10.0000 mg | Freq: Once | INTRAMUSCULAR | Status: AC
  Administered 2013-01-29: 10 mg via INTRAVENOUS

## 2013-01-29 MED ORDER — METOCLOPRAMIDE HCL 5 MG/ML IJ SOLN
INTRAMUSCULAR | Status: AC
  Filled 2013-01-29: qty 2

## 2013-01-29 MED ORDER — SODIUM CHLORIDE 0.9 % IV BOLUS (SEPSIS)
1000.0000 mL | Freq: Once | INTRAVENOUS | Status: AC
  Administered 2013-01-29: 1000 mL via INTRAVENOUS

## 2013-01-29 NOTE — ED Notes (Signed)
Pt was adv to keep the appt w/GI today at 1330... Pt verbalized understanding.

## 2013-01-29 NOTE — ED Provider Notes (Signed)
CSN: [id number]     Arrival date & time 01/29/13  4098 History   First MD Initiated Contact with Patient 01/29/13 [phone]     Chief Complaint  Patient presents with  . Abdominal Pain   (Consider location/radiation/quality/duration/timing/severity/associated sxs/prior Treatment) HPI Comments: Patient presents complaining of abdominal bloating, nausea, vomiting, diarrhea. She has had some bloating and pressure for the past one to 2 months and is currently under the care of a gastroenterologist. 4 days ago, she had acute onset of feeling fatigue, nausea, vomiting, diarrhea. She remains unable to hold down solids or fluids. She finally got to the point where she felt like she had enough energy to get up and come to the doctor's office. She has called her gastroenterologist but they have not returned her phone calls. She is currently feeling nauseous and fatigued. She denies blood in her diarrhea or vomitus. She denies fever, chills, cough, chest pain, shortness of breath. Last BM was yesterday.  Patient is a 57 y.o. female presenting with abdominal pain.  Abdominal Pain Associated symptoms include abdominal pain. Pertinent negatives include no chest pain and no shortness of breath.    Past Medical History  Diagnosis Date  . Low back pain   . Anxiety   . Migraines   . GOITER, MULTINODULAR 08/17/2009  . OSTEOPOROSIS 08/17/2009  . ASYMPTOMATIC POSTMENOPAUSAL STATUS 08/17/2009  . DYSPHAGIA 06/19/2007  . Supraventricular tachycardia   . Hyperlipidemia   . Mitral valve prolapse    Past Surgical History  Procedure Laterality Date  . Laporoscopic abdominal surgery    . Cervix surgery     Family History  Problem Relation Age of Onset  . Thyroid disease Neg Hx   . Goiter Neg Hx    History  Substance Use Topics  . Smoking status: Never Smoker   . Smokeless tobacco: Never Used  . Alcohol Use: Yes   OB History   Grav Para Term Preterm Abortions TAB SAB Ect Mult Living                 Review  of Systems  Constitutional: Positive for fatigue. Negative for fever and chills.  Eyes: Negative for visual disturbance.  Respiratory: Negative for cough and shortness of breath.   Cardiovascular: Negative for chest pain, palpitations and leg swelling.  Gastrointestinal: Positive for nausea, vomiting, abdominal pain, diarrhea and abdominal distention. Negative for blood in stool and anal bleeding.  Endocrine: Negative for polydipsia and polyuria.  Genitourinary: Negative for dysuria, urgency and frequency.  Musculoskeletal: Negative for arthralgias and myalgias.  Skin: Negative for rash.  Neurological: Negative for dizziness, weakness and light-headedness.    Allergies  Doxycycline and Tetracycline hcl  Home Medications   Current Outpatient Rx  Name  Route  Sig  Dispense  Refill  . alendronate (FOSAMAX) 70 MG tablet   Oral   Take 1 tablet (70 mg total) by mouth every 7 (seven) days. Take with a full glass of water on an empty stomach.   4 tablet   11   . ALPRAZolam (XANAX) 0.5 MG tablet   Oral   Take 1 tablet (0.5 mg total) by mouth daily as needed. For anxiety   90 tablet   1   . aspirin 81 MG tablet   Oral   Take 81 mg by mouth daily.           . calcium carbonate 200 MG capsule   Oral   Take 250 mg by mouth daily.           [name] Kitchen  cholecalciferol (VITAMIN D) 1000 UNITS tablet   Oral   Take 1,000 Units by mouth daily.           [name] Kitchen diltiazem (CARDIZEM CD) 240 MG 24 hr capsule   Oral   Take 1 capsule (240 mg total) by mouth daily.   30 capsule   11   . Ferrous Sulfate (IRON) 325 (65 FE) MG TABS   Oral   Take by mouth.         [name] Kitchen glucosamine-chondroitin 500-400 MG tablet   Oral   Take 1 tablet by mouth daily.          . norethindrone-ethinyl estradiol (FEMHRT 1/5) 1-5 MG-MCG TABS   Oral   Take 1 tablet by mouth daily.          . pantoprazole (PROTONIX) 40 MG tablet   Oral   Take 1 tablet (40 mg total) by mouth daily.   30 tablet   11   .  simvastatin (ZOCOR) 20 MG tablet   Oral   Take 1 tablet (20 mg total) by mouth at bedtime.   30 tablet   11   . SUMAtriptan (IMITREX) 100 MG tablet      TAKE 1 TABLET (100 MG TOTAL) BY MOUTH EVERY 2 (TWO) HOURS AS NEEDED.   10 tablet   5   . SUMAtriptan (IMITREX) 100 MG tablet      TAKE 1 TABLET (100 MG TOTAL) BY MOUTH EVERY 2 (TWO) HOURS AS NEEDED.   10 tablet   5    BP 134/76  Pulse 109  Temp(Src) 97.8 F (36.6 C) (Oral)  Resp 16  SpO2 100% Physical Exam  Nursing note and vitals reviewed. Constitutional: She is oriented to person, place, and time. Vital signs are normal. She appears well-developed and well-nourished. No distress.  HENT:  Head: Normocephalic and atraumatic.  Right Ear: External ear normal.  Left Ear: External ear normal.  Nose: Nose normal.  Mouth/Throat: Oropharynx is clear and moist. No oropharyngeal exudate.  Neck: Normal range of motion. Neck supple.  Cardiovascular: Regular rhythm and normal heart sounds.  Tachycardia present.  Exam reveals no gallop and no friction rub.   No murmur heard. Pulmonary/Chest: Effort normal and breath sounds normal. No respiratory distress. She has no wheezes. She has no rales.  Abdominal: Soft. Bowel sounds are normal. She exhibits no mass. There is no tenderness. There is no rebound and no guarding.  Lymphadenopathy:    She has no cervical adenopathy.  Neurological: She is alert and oriented to person, place, and time. She has normal strength. Coordination normal.  Skin: Skin is warm and dry. No rash noted. She is not diaphoretic.  Psychiatric: She has a normal mood and affect. Judgment normal.    ED Course  Procedures (including critical care time) Labs Review Labs Reviewed  CBC WITH DIFFERENTIAL - Abnormal; Notable for the following:    RBC 3.70 (*)    MCV 102.7 (*)    MCH 35.4 (*)    Neutrophils Relative % 87 (*)    Lymphocytes Relative 7 (*)    Lymphs Abs 0.5 (*)    All other components within normal  limits  COMPREHENSIVE METABOLIC PANEL - Abnormal; Notable for the following:    Chloride 94 (*)    BUN 5 (*)    Creatinine, Ser 0.43 (*)    Albumin 3.4 (*)    AST 409 (*)    ALT 63 (*)    Alkaline Phosphatase 385 (*)  All other components within normal limits  POCT I-STAT, CHEM 8 - Abnormal; Notable for the following:    BUN <3 (*)    Calcium, Ion 1.05 (*)    All other components within normal limits  LIPASE, BLOOD   Imaging Review No results found.  Suspect viral gastroenteritis. Patient is very concerned so we'll send some labs to be sure, checking orthostatics.   Patient is orthostatic. Giving a 1 L bolus normal saline while waiting for labs to come back.  MDM   1. Nausea vomiting and diarrhea   2. Transaminitis    Labs came back showing a significant elevation of AST and alkaline phosphatase. I have called her gastroenterologist, Dr. Loreta Ave, who will see this patient today at 1:30 PM. I've advised her that if she has any worsening in her condition between now and then, she should call Dr. Kenna Gilbert office or she may return to the emergency department. She has received 8 mg of Zofran and 10 mg of Reglan here for nausea, her nausea is currently decreased but she is still having some nausea. She has also received 1 L of normal saline. She remains persistently tachycardic and has very mild symptomatic improvement. Patient is stable, she is comfortable with this plan.    Graylon Good, PA-C 01/29/13 1155

## 2013-01-29 NOTE — ED Notes (Signed)
Pt c/o abd pain/dicomfort... Hx of gastroparesis Sxs include: bloating, pressure x1-2 months... Has not been able to eat for the past 4 days Also c/o vomiting and feeling fatigue Denies: f/d She is alert w/no signs of acute distress.

## 2013-01-29 NOTE — Telephone Encounter (Signed)
ER LETTER SENT 

## 2013-01-30 NOTE — ED Provider Notes (Signed)
Medical screening examination/treatment/procedure(s) were performed by a resident physician or non-physician practitioner and as the supervising physician I was immediately available for consultation/collaboration.  Jalani Rominger, MD    Adelin Ventrella S Osamah Schmader, MD 01/30/13 0932 

## 2013-01-31 ENCOUNTER — Other Ambulatory Visit: Payer: Self-pay | Admitting: Gastroenterology

## 2013-01-31 DIAGNOSIS — R109 Unspecified abdominal pain: Secondary | ICD-10-CM

## 2013-02-01 ENCOUNTER — Ambulatory Visit
Admission: RE | Admit: 2013-02-01 | Discharge: 2013-02-01 | Disposition: A | Discharge status: Home / Self Care | Payer: Federal, State, Local not specified - PPO | Source: Ambulatory Visit | Attending: Gastroenterology | Admitting: Gastroenterology

## 2013-02-01 DIAGNOSIS — R109 Unspecified abdominal pain: Secondary | ICD-10-CM

## 2013-02-04 ENCOUNTER — Encounter (HOSPITAL_COMMUNITY)
Admission: RE | Admit: 2013-02-04 | Discharge: 2013-02-04 | Disposition: A | Discharge status: Home / Self Care | Payer: Federal, State, Local not specified - PPO | Source: Ambulatory Visit | Attending: Gastroenterology | Admitting: Gastroenterology

## 2013-02-04 DIAGNOSIS — R109 Unspecified abdominal pain: Secondary | ICD-10-CM | POA: Insufficient documentation

## 2013-02-11 ENCOUNTER — Encounter (INDEPENDENT_AMBULATORY_CARE_PROVIDER_SITE_OTHER): Payer: Self-pay | Admitting: Surgery

## 2013-02-12 ENCOUNTER — Other Ambulatory Visit (HOSPITAL_COMMUNITY): Payer: Federal, State, Local not specified - PPO

## 2013-02-13 ENCOUNTER — Encounter (INDEPENDENT_AMBULATORY_CARE_PROVIDER_SITE_OTHER): Payer: Self-pay | Admitting: Surgery

## 2013-02-13 ENCOUNTER — Ambulatory Visit (INDEPENDENT_AMBULATORY_CARE_PROVIDER_SITE_OTHER): Payer: Federal, State, Local not specified - PPO | Admitting: Surgery

## 2013-02-13 ENCOUNTER — Other Ambulatory Visit: Payer: Self-pay | Admitting: Obstetrics and Gynecology

## 2013-02-13 VITALS — BP 128/72 | HR 100 | Temp 98.0°F | Resp 18 | Ht 62.0 in | Wt 123.0 lb

## 2013-02-13 DIAGNOSIS — D134 Benign neoplasm of liver: Secondary | ICD-10-CM

## 2013-02-13 NOTE — Progress Notes (Signed)
Patient ID: Terri Ayala, female   DOB: 21-Mar-1955, 57 y.o.   MRN: [id number]  Chief Complaint  Patient presents with  . Other    gallbladder polyps    HPI Terri Ayala is a 57 y.o. female.   HPI This is a pleasant female referred by Dr. Loreta Ave.  For many months, she has abdominal bloating, right-sided abdominal pain, nausea, and vomiting. She has even had some subtle symptoms for years. It is now acutely getting worse and she feels bad all the time. The pain stays in the epigastrium and right side of the abdomen. It is sharp and moderate in intensity. It occurs with just about anything she needs. She has had an extensive workup which is unremarkable except for gallbladder adenomatosis and elevated liver function tests. Past Medical History  Diagnosis Date  . Low back pain   . Anxiety   . Migraines   . GOITER, MULTINODULAR 08/17/2009  . OSTEOPOROSIS 08/17/2009  . ASYMPTOMATIC POSTMENOPAUSAL STATUS 08/17/2009  . DYSPHAGIA 06/19/2007  . Supraventricular tachycardia   . Hyperlipidemia   . Mitral valve prolapse     Past Surgical History  Procedure Laterality Date  . Laporoscopic abdominal surgery    . Cervix surgery      Family History  Problem Relation Age of Onset  . Thyroid disease Neg Hx   . Goiter Neg Hx   . Cancer Father   . Osteoporosis Mother     Social History History  Substance Use Topics  . Smoking status: Never Smoker   . Smokeless tobacco: Never Used  . Alcohol Use: Yes    Allergies  Allergen Reactions  . Doxycycline Other (See Comments)    Reaction unknown  . Tetracycline Hcl Other (See Comments)    Reaction unknown    Current Outpatient Prescriptions  Medication Sig Dispense Refill  . alendronate (FOSAMAX) 70 MG tablet Take 1 tablet (70 mg total) by mouth every 7 (seven) days. Take with a full glass of water on an empty stomach.  4 tablet  11  . ALPRAZolam (XANAX) 0.5 MG tablet Take 1 tablet (0.5 mg total) by mouth daily as needed. For anxiety  90 tablet   1  . aspirin 81 MG tablet Take 81 mg by mouth daily.        . calcium carbonate 200 MG capsule Take 250 mg by mouth daily.        . cholecalciferol (VITAMIN D) 1000 UNITS tablet Take 1,000 Units by mouth daily.        [name] Kitchen diltiazem (CARDIZEM CD) 240 MG 24 hr capsule Take 1 capsule (240 mg total) by mouth daily.  30 capsule  11  . glucosamine-chondroitin 500-400 MG tablet Take 1 tablet by mouth daily.       . metoCLOPramide (REGLAN) 10 MG tablet       . norethindrone-ethinyl estradiol (FEMHRT 1/5) 1-5 MG-MCG TABS Take 1 tablet by mouth daily.       . ondansetron (ZOFRAN) 8 MG tablet       . ondansetron (ZOFRAN-ODT) 8 MG disintegrating tablet       . pantoprazole (PROTONIX) 40 MG tablet Take 1 tablet (40 mg total) by mouth daily.  30 tablet  11  . simvastatin (ZOCOR) 20 MG tablet Take 1 tablet (20 mg total) by mouth at bedtime.  30 tablet  11  . SUMAtriptan (IMITREX) 100 MG tablet TAKE 1 TABLET (100 MG TOTAL) BY MOUTH EVERY 2 (TWO) HOURS AS NEEDED.  10 tablet  5  . SUMAtriptan (IMITREX) 100 MG tablet TAKE 1 TABLET (100 MG TOTAL) BY MOUTH EVERY 2 (TWO) HOURS AS NEEDED.  10 tablet  5  . Ferrous Sulfate (IRON) 325 (65 FE) MG TABS Take by mouth.       No current facility-administered medications for this visit.    Review of Systems Review of Systems  Constitutional: Negative for fever, chills and unexpected weight change.  HENT: Negative for congestion, hearing loss, sore throat, trouble swallowing and voice change.   Eyes: Negative for visual disturbance.  Respiratory: Negative for cough and wheezing.   Cardiovascular: Negative for chest pain, palpitations and leg swelling.  Gastrointestinal: Positive for nausea, vomiting, abdominal pain, diarrhea and abdominal distention. Negative for constipation, blood in stool and anal bleeding.  Genitourinary: Negative for hematuria, vaginal bleeding and difficulty urinating.  Musculoskeletal: Negative for arthralgias.  Skin: Negative for rash and wound.   Neurological: Negative for seizures, syncope and headaches.  Hematological: Negative for adenopathy. Does not bruise/bleed easily.  Psychiatric/Behavioral: Negative for confusion.    Blood pressure 128/72, pulse 100, temperature 98 F (36.7 C), resp. rate 18, height 5\' 2"  (1.575 m), weight 123 lb (55.792 kg).  Physical Exam Physical Exam  Constitutional: She is oriented to person, place, and time. She appears well-developed and well-nourished. No distress.  HENT:  Head: Normocephalic and atraumatic.  Right Ear: External ear normal.  Left Ear: External ear normal.  Nose: Nose normal.  Mouth/Throat: Oropharynx is clear and moist. No oropharyngeal exudate.  Eyes: Conjunctivae are normal. Pupils are equal, round, and reactive to light. Right eye exhibits no discharge. Left eye exhibits no discharge. No scleral icterus.  Neck: Normal range of motion. Neck supple. No tracheal deviation present. No thyromegaly present.  Cardiovascular: Regular rhythm, normal heart sounds and intact distal pulses.   No murmur heard. tachycardic  Pulmonary/Chest: Effort normal and breath sounds normal. No respiratory distress. She has no wheezes. She has no rales.  Abdominal: Soft. Bowel sounds are normal.  She has a vague fullness in the upper abdomen. She has mild to moderate tenderness in the epigastrium and right upper quadrant with mild guarding  Musculoskeletal: Normal range of motion. She exhibits no edema and no tenderness.  Lymphadenopathy:    She has no cervical adenopathy.  Neurological: She is alert and oriented to person, place, and time.  Skin: Skin is warm and dry. No rash noted. She is not diaphoretic. No erythema.  Psychiatric: Her behavior is normal. Judgment normal.    Data Reviewed I have reviewed her multiple studies. Her CAT scan and upper endoscopy are unremarkable. Her ultrasound shows gallbladder adenomatosis. Her ejection fraction is normal. Her liver function tests are elevated  including alkaline phosphatase, AST and ALT.  bilirubin is normal. Her gastric emptying study at slightly increased  Assessment    Gallbladder adenomatosis     Plan    I am worried that this actually represent stones given her symptoms and her elevated liver function tests. I discussed this with her in detail. We discussed continued conservative measures  Versus laparoscopic cholecystectomy. I would recommend laparoscopic cholecystectomy with cholangiogram. I discussed the surgery with her in detail. I discussed the risks which includes but is not limited to bleeding, infection, bile duct injury, bile leak, injury to surrounding structures, and the chance this may not resolve her symptoms. I also discussed conversion to an open procedure and DVT. I then discussed postoperative recovery. She wished to proceed with laparoscopic cholecystectomy which will be scheduled  Jamaiya Tunnell A 02/13/2013, 3:32 PM

## 2013-03-07 ENCOUNTER — Encounter (INDEPENDENT_AMBULATORY_CARE_PROVIDER_SITE_OTHER): Payer: Self-pay

## 2013-03-08 ENCOUNTER — Encounter (HOSPITAL_COMMUNITY)
Admission: RE | Admit: 2013-03-08 | Discharge: 2013-03-08 | Disposition: A | Discharge status: Home / Self Care | Payer: Federal, State, Local not specified - PPO | Source: Ambulatory Visit | Attending: Anesthesiology | Admitting: Anesthesiology

## 2013-03-08 ENCOUNTER — Encounter (HOSPITAL_COMMUNITY): Payer: Self-pay

## 2013-03-08 ENCOUNTER — Encounter (HOSPITAL_COMMUNITY): Payer: Self-pay | Admitting: Pharmacy Technician

## 2013-03-08 ENCOUNTER — Encounter (HOSPITAL_COMMUNITY)
Admission: RE | Admit: 2013-03-08 | Discharge: 2013-03-08 | Disposition: A | Discharge status: Home / Self Care | Payer: Federal, State, Local not specified - PPO | Source: Ambulatory Visit | Attending: Surgery | Admitting: Surgery

## 2013-03-08 DIAGNOSIS — Z01810 Encounter for preprocedural cardiovascular examination: Secondary | ICD-10-CM | POA: Insufficient documentation

## 2013-03-08 DIAGNOSIS — Z01818 Encounter for other preprocedural examination: Secondary | ICD-10-CM | POA: Insufficient documentation

## 2013-03-08 DIAGNOSIS — Z01811 Encounter for preprocedural respiratory examination: Secondary | ICD-10-CM | POA: Insufficient documentation

## 2013-03-08 DIAGNOSIS — Z01812 Encounter for preprocedural laboratory examination: Secondary | ICD-10-CM | POA: Insufficient documentation

## 2013-03-08 HISTORY — DX: Other specified postprocedural states: R11.2

## 2013-03-08 HISTORY — DX: Other specified postprocedural states: Z98.890

## 2013-03-08 HISTORY — DX: Nausea with vomiting, unspecified: R11.2

## 2013-03-08 LAB — BASIC METABOLIC PANEL
BUN: 5 mg/dL — AB (ref 6–23)
CALCIUM: 9.3 mg/dL (ref 8.4–10.5)
CO2: 23 mEq/L (ref 19–32)
CREATININE: 0.53 mg/dL (ref 0.50–1.10)
Chloride: 94 mEq/L — ABNORMAL LOW (ref 96–112)
GFR calc non Af Amer: >90 mL/min (ref >90)
Glucose, Bld: 79 mg/dL (ref 70–99)
Potassium: 4.4 mEq/L (ref 3.7–5.3)
Sodium: 136 mEq/L — ABNORMAL LOW (ref 137–147)

## 2013-03-08 LAB — CBC
HEMATOCRIT: 35 % — AB (ref 36.0–46.0)
Hemoglobin: 11.9 g/dL — ABNORMAL LOW (ref 12.0–15.0)
MCH: 35.1 pg — ABNORMAL HIGH (ref 26.0–34.0)
MCHC: 34 g/dL (ref 30.0–36.0)
MCV: 103.2 fL — ABNORMAL HIGH (ref 78.0–100.0)
PLATELETS: 192 10*3/uL (ref 150–400)
RBC: 3.39 MIL/uL — ABNORMAL LOW (ref 3.87–5.11)
RDW: 13.8 % (ref 11.5–15.5)
WBC: 8.7 10*3/uL (ref 4.0–10.5)

## 2013-03-08 NOTE — Pre-Procedure Instructions (Signed)
Terri Ayala  03/08/2013   Your procedure is scheduled on:  March 14, 2013  Report to [facility] (Entrance A) at 5:30 AM.  Call this number if you have problems the morning of surgery: [phone]   Remember:   Do not eat food or drink liquids after midnight.   Take these medicines the morning of surgery with A SIP OF WATER: alendronate (FOSAMAX), diltiazem (CARDIZEM CD), norethindrone-ethinyl estradiol,pantoprazole  (PROTONIX), ondansetron (ZOFRAN) as needed,   Do not wear jewelry, make-up or nail polish.  Do not wear lotions, powders, or perfumes. You may wear deodorant.  Do not shave 48 hours prior to surgery. Men may shave face and neck.  Do not bring valuables to the hospital.  [facility] is not responsible for any belongings or valuables.               Contacts, dentures or bridgework may not be worn into surgery.  Leave suitcase in the car. After surgery it may be brought to your room.  For patients admitted to the hospital, discharge time is determined by your treatment team.               Patients discharged the day of surgery will not be allowed to drive home.  Name and phone number of your driver:   Special Instructions: Shower using CHG 2 nights before surgery and the night before surgery.  If you shower the day of surgery use CHG.  Use special wash - you have one bottle of CHG for all showers.  You should use approximately 1/3 of the bottle for each shower.   Please read over the following fact sheets that you were given: Pain Booklet, Coughing and Deep Breathing and Surgical Site Infection Prevention

## 2013-03-11 ENCOUNTER — Encounter (INDEPENDENT_AMBULATORY_CARE_PROVIDER_SITE_OTHER): Payer: Self-pay

## 2013-03-11 ENCOUNTER — Telehealth (INDEPENDENT_AMBULATORY_CARE_PROVIDER_SITE_OTHER): Payer: Self-pay

## 2013-03-11 NOTE — Telephone Encounter (Signed)
Patient states she has not eat /drank anything for 4 days, Phenergan supp. Did not help this am  that DR. Mann ordered. Patient is having fatigue and weakness . I advised her do anther phenergan supp. And we will call her further instructions ,she may need to go the ER for fluids.

## 2013-03-11 NOTE — Telephone Encounter (Signed)
F/U call Patient states she has drank a power aide no N/V voiced is feeling a little better, dizziness is better  Advised to call if her condition changes.

## 2013-03-11 NOTE — Progress Notes (Addendum)
Anesthesia Chart Review:  Patient is a 58 year old female scheduled for laparoscopic cholecystectomy on 03/14/13 by Dr. [name].    History includes non-smoker, SVT '12 requiring Adenosine (now on diltiazem), MVP, multinodular goiter (Dr. Loanne Drilling), anxiety, migraines, HLD, osteoporosis, dysphagia '09. PCP is Dr. Jill Alexanders. EP cardiologist is Dr. Cristopher Peru who saw her last 03/01/12.  He felt her symptoms were fairly well controlled with medication but did offer ablation therapy if she wanted to proceed. Primary cardiologist is Dr. Angelena Form, last visit 01/10/12.  EKG on 03/08/13 showed ST at 114 bpm, possible anterior infarct (age undetermined).  She now has a low r wave in V3 when compared to prior EKG on 01/10/12.  Nuclear stress test on 06/30/11 (done for atypical chest pain) showed: Overall Impression: Normal stress nuclear study. LV Ejection Fraction: 81%. LV Wall Motion: NL LV Function; NL Wall Motion.  30 day event monitor read in 01/2012 showed NSR/ST with no PACs or PVCs.  Echo on 03/08/10 showed: Left ventricle: The cavity size was normal. Wall thickness was increased in a pattern of mild LVH. There was mild focal basal hypertrophy of the septum. Systolic function was normal. The estimated ejection fraction was in the range of 60% to 65%. Wall motion was normal; there were no regional wall motion abnormalities. Features are consistent with a pseudonormal left ventricular filling pattern, with concomitant abnormal relaxation and increased filling pressure (grade 2 diastolic dysfunction). Trivial mitral and tricuspid regurgitation.  CXR on 03/08/13 showed COPD changes, no acute abnormalities.  Preoperative labs noted.  Unfortunately, HFP was not checked.  (Her AST was 409, ALT 63, Alk Phos 385 on 01/29/13.)  Chart, including cardiac history and labs, reviewed with anesthesiologist Dr. Conrad Reeds.  Patient is on a calcium channel blocker and has been in SR/ST by EKG.  She had a normal stress  test within the past two years.  If she continues to maintain SR, then it is anticipated that she can proceed from that standpoint.  However, would like HFP to be re-evaluated preoperatively. I've sent a staff message to Dr. Ninfa Linden to see if he would like patient to come in Tuesday or Wednesday for repeat labs with HFP--otherwise will need to get on the day of surgery.  George Hugh [facility] Stay [facility]/Anesthesiology Phone [phone] 03/11/2013 5:18 PM  Addendum: 03/12/2013 9:40 AM Dr. Ninfa Linden did not feel HFP needed to be repeated, so I re-addressed with Dr. Conrad .  Plan to get STAT HFP on arrival just to ensure no significant increases from her previous levels.  Reply sent to Dr. Ninfa Linden.

## 2013-03-13 MED ORDER — CEFAZOLIN SODIUM-DEXTROSE 2-3 GM-% IV SOLR
2.0000 g | INTRAVENOUS | Status: AC
Start: 2013-03-14 — End: 2013-03-14
  Administered 2013-03-14: 2 g via INTRAVENOUS
  Filled 2013-03-13 (×2): qty 50

## 2013-03-14 ENCOUNTER — Encounter (HOSPITAL_COMMUNITY): Payer: Self-pay | Admitting: *Deleted

## 2013-03-14 ENCOUNTER — Encounter (HOSPITAL_COMMUNITY)
Admission: RE | Disposition: A | Discharge status: Home Health | Payer: Self-pay | Source: Ambulatory Visit | Attending: Surgery

## 2013-03-14 ENCOUNTER — Encounter (HOSPITAL_COMMUNITY): Payer: Federal, State, Local not specified - PPO | Admitting: Vascular Surgery

## 2013-03-14 ENCOUNTER — Inpatient Hospital Stay (HOSPITAL_COMMUNITY)
Admission: RE | Admit: 2013-03-14 | Discharge: 2013-03-26 | DRG: 417 | Disposition: A | Discharge status: Home Health | Payer: Federal, State, Local not specified - PPO | Source: Ambulatory Visit | Attending: Surgery | Admitting: Surgery

## 2013-03-14 ENCOUNTER — Ambulatory Visit (HOSPITAL_COMMUNITY): Payer: Federal, State, Local not specified - PPO | Admitting: Certified Registered"

## 2013-03-14 DIAGNOSIS — F1010 Alcohol abuse, uncomplicated: Secondary | ICD-10-CM | POA: Diagnosis present

## 2013-03-14 DIAGNOSIS — K824 Cholesterolosis of gallbladder: Secondary | ICD-10-CM | POA: Diagnosis present

## 2013-03-14 DIAGNOSIS — Z79899 Other long term (current) drug therapy: Secondary | ICD-10-CM

## 2013-03-14 DIAGNOSIS — R5381 Other malaise: Secondary | ICD-10-CM | POA: Diagnosis present

## 2013-03-14 DIAGNOSIS — K760 Fatty (change of) liver, not elsewhere classified: Secondary | ICD-10-CM

## 2013-03-14 DIAGNOSIS — K811 Chronic cholecystitis: Principal | ICD-10-CM | POA: Diagnosis present

## 2013-03-14 DIAGNOSIS — IMO0002 Reserved for concepts with insufficient information to code with codable children: Secondary | ICD-10-CM

## 2013-03-14 DIAGNOSIS — R7402 Elevation of levels of lactic acid dehydrogenase (LDH): Secondary | ICD-10-CM

## 2013-03-14 DIAGNOSIS — Z23 Encounter for immunization: Secondary | ICD-10-CM

## 2013-03-14 DIAGNOSIS — G589 Mononeuropathy, unspecified: Secondary | ICD-10-CM | POA: Diagnosis present

## 2013-03-14 DIAGNOSIS — R740 Nonspecific elevation of levels of transaminase and lactic acid dehydrogenase [LDH]: Secondary | ICD-10-CM

## 2013-03-14 DIAGNOSIS — R7401 Elevation of levels of liver transaminase levels: Secondary | ICD-10-CM

## 2013-03-14 DIAGNOSIS — I059 Rheumatic mitral valve disease, unspecified: Secondary | ICD-10-CM | POA: Diagnosis present

## 2013-03-14 DIAGNOSIS — K7689 Other specified diseases of liver: Secondary | ICD-10-CM | POA: Diagnosis present

## 2013-03-14 DIAGNOSIS — E785 Hyperlipidemia, unspecified: Secondary | ICD-10-CM | POA: Diagnosis present

## 2013-03-14 DIAGNOSIS — F411 Generalized anxiety disorder: Secondary | ICD-10-CM | POA: Diagnosis present

## 2013-03-14 DIAGNOSIS — R160 Hepatomegaly, not elsewhere classified: Secondary | ICD-10-CM | POA: Diagnosis present

## 2013-03-14 DIAGNOSIS — E43 Unspecified severe protein-calorie malnutrition: Secondary | ICD-10-CM | POA: Diagnosis present

## 2013-03-14 DIAGNOSIS — M810 Age-related osteoporosis without current pathological fracture: Secondary | ICD-10-CM | POA: Diagnosis present

## 2013-03-14 DIAGNOSIS — Z7982 Long term (current) use of aspirin: Secondary | ICD-10-CM

## 2013-03-14 DIAGNOSIS — R112 Nausea with vomiting, unspecified: Secondary | ICD-10-CM

## 2013-03-14 DIAGNOSIS — R7309 Other abnormal glucose: Secondary | ICD-10-CM | POA: Diagnosis present

## 2013-03-14 HISTORY — PX: CHOLECYSTECTOMY: SHX55

## 2013-03-14 LAB — HEPATIC FUNCTION PANEL
ALBUMIN: 3.6 g/dL (ref 3.5–5.2)
ALT: 37 U/L — ABNORMAL HIGH (ref 0–35)
AST: 169 U/L — ABNORMAL HIGH (ref 0–37)
Alkaline Phosphatase: 323 U/L — ABNORMAL HIGH (ref 39–117)
BILIRUBIN TOTAL: 2.8 mg/dL — AB (ref 0.3–1.2)
Bilirubin, Direct: 1.6 mg/dL — ABNORMAL HIGH (ref 0.0–0.3)
Indirect Bilirubin: 1.2 mg/dL — ABNORMAL HIGH (ref 0.3–0.9)
Total Protein: 7.4 g/dL (ref 6.0–8.3)

## 2013-03-14 SURGERY — LAPAROSCOPIC CHOLECYSTECTOMY WITH INTRAOPERATIVE CHOLANGIOGRAM
Anesthesia: General | Site: Abdomen

## 2013-03-14 MED ORDER — PROMETHAZINE HCL 25 MG/ML IJ SOLN
25.0000 mg | INTRAMUSCULAR | Status: DC | PRN
  Administered 2013-03-14 – 2013-03-15 (×6): 25 mg via INTRAVENOUS
  Filled 2013-03-14 (×6): qty 1

## 2013-03-14 MED ORDER — DILTIAZEM HCL ER COATED BEADS 240 MG PO CP24
240.0000 mg | ORAL_CAPSULE | Freq: Every day | ORAL | Status: DC
  Administered 2013-03-16 – 2013-03-26 (×10): 240 mg via ORAL
  Filled 2013-03-14 (×13): qty 1

## 2013-03-14 MED ORDER — BUPIVACAINE-EPINEPHRINE (PF) 0.25% -1:200000 IJ SOLN
INTRAMUSCULAR | Status: AC
  Filled 2013-03-14: qty 30

## 2013-03-14 MED ORDER — LIDOCAINE HCL (CARDIAC) 20 MG/ML IV SOLN
INTRAVENOUS | Status: DC | PRN
  Administered 2013-03-14: 80 mg via INTRAVENOUS

## 2013-03-14 MED ORDER — FENTANYL CITRATE 0.05 MG/ML IJ SOLN
INTRAMUSCULAR | Status: DC | PRN
  Administered 2013-03-14: 50 ug via INTRAVENOUS
  Administered 2013-03-14: 100 ug via INTRAVENOUS
  Administered 2013-03-14 (×4): 50 ug via INTRAVENOUS
  Administered 2013-03-14: 100 ug via INTRAVENOUS
  Administered 2013-03-14: 50 ug via INTRAVENOUS

## 2013-03-14 MED ORDER — ENOXAPARIN SODIUM 40 MG/0.4ML ~~LOC~~ SOLN
40.0000 mg | SUBCUTANEOUS | Status: DC
  Administered 2013-03-15 – 2013-03-26 (×12): 40 mg via SUBCUTANEOUS
  Filled 2013-03-14 (×17): qty 0.4

## 2013-03-14 MED ORDER — HYDROCODONE-ACETAMINOPHEN 5-325 MG PO TABS
1.0000 | ORAL_TABLET | ORAL | Status: DC | PRN
  Administered 2013-03-16 – 2013-03-17 (×4): 2 via ORAL
  Administered 2013-03-17 (×2): 1 via ORAL
  Administered 2013-03-18 – 2013-03-19 (×2): 2 via ORAL
  Filled 2013-03-14 (×6): qty 2
  Filled 2013-03-14 (×2): qty 1

## 2013-03-14 MED ORDER — ONDANSETRON HCL 4 MG/2ML IJ SOLN
INTRAMUSCULAR | Status: AC
  Filled 2013-03-14: qty 2

## 2013-03-14 MED ORDER — ONDANSETRON HCL 4 MG/2ML IJ SOLN
4.0000 mg | Freq: Four times a day (QID) | INTRAMUSCULAR | Status: DC | PRN
  Administered 2013-03-14 – 2013-03-17 (×5): 4 mg via INTRAVENOUS
  Filled 2013-03-14 (×3): qty 2

## 2013-03-14 MED ORDER — METOCLOPRAMIDE HCL 10 MG PO TABS: 10.0000 mg | ORAL_TABLET | Freq: Three times a day (TID) | ORAL | Status: DC

## 2013-03-14 MED ORDER — SUCCINYLCHOLINE CHLORIDE 20 MG/ML IJ SOLN
INTRAMUSCULAR | Status: DC | PRN
  Administered 2013-03-14: 120 mg via INTRAVENOUS

## 2013-03-14 MED ORDER — MIDAZOLAM HCL 5 MG/5ML IJ SOLN
INTRAMUSCULAR | Status: DC | PRN
  Administered 2013-03-14 (×2): 1 mg via INTRAVENOUS
  Administered 2013-03-14: 2 mg via INTRAVENOUS

## 2013-03-14 MED ORDER — OXYCODONE HCL 5 MG PO TABS: 5.0000 mg | ORAL_TABLET | Freq: Once | ORAL | Status: DC | PRN

## 2013-03-14 MED ORDER — INFLUENZA VAC SPLIT QUAD 0.5 ML IM SUSP
0.5000 mL | INTRAMUSCULAR | Status: AC
  Administered 2013-03-16: 0.5 mL via INTRAMUSCULAR
  Filled 2013-03-14: qty 0.5

## 2013-03-14 MED ORDER — ONDANSETRON HCL 4 MG/2ML IJ SOLN: 4.0000 mg | Freq: Once | INTRAMUSCULAR | Status: DC

## 2013-03-14 MED ORDER — ONDANSETRON HCL 4 MG PO TABS: 8.0000 mg | ORAL_TABLET | Freq: Three times a day (TID) | ORAL | Status: DC | PRN

## 2013-03-14 MED ORDER — SODIUM CHLORIDE 0.9 % IV SOLN
INTRAVENOUS | Status: DC | PRN
  Administered 2013-03-14: 08:00:00

## 2013-03-14 MED ORDER — HYDROMORPHONE HCL PF 1 MG/ML IJ SOLN
1.0000 mg | INTRAMUSCULAR | Status: DC | PRN
  Administered 2013-03-14 – 2013-03-19 (×10): 1 mg via INTRAVENOUS
  Filled 2013-03-14 (×10): qty 1

## 2013-03-14 MED ORDER — HYDROMORPHONE HCL PF 1 MG/ML IJ SOLN
INTRAMUSCULAR | Status: AC
  Filled 2013-03-14: qty 1

## 2013-03-14 MED ORDER — LACTATED RINGERS IV SOLN
INTRAVENOUS | Status: DC | PRN
  Administered 2013-03-14 (×2): via INTRAVENOUS

## 2013-03-14 MED ORDER — PANTOPRAZOLE SODIUM 40 MG PO TBEC
40.0000 mg | DELAYED_RELEASE_TABLET | Freq: Every day | ORAL | Status: DC
  Administered 2013-03-16 – 2013-03-26 (×11): 40 mg via ORAL
  Filled 2013-03-14 (×12): qty 1

## 2013-03-14 MED ORDER — KCL IN DEXTROSE-NACL 20-5-0.9 MEQ/L-%-% IV SOLN
INTRAVENOUS | Status: AC
  Administered 2013-03-14: 150 mL/h via INTRAVENOUS
  Administered 2013-03-14 – 2013-03-15 (×2): via INTRAVENOUS
  Administered 2013-03-15: 125 mL/h via INTRAVENOUS
  Administered 2013-03-16 – 2013-03-18 (×7): via INTRAVENOUS
  Filled 2013-03-14 (×15): qty 1000

## 2013-03-14 MED ORDER — SODIUM CHLORIDE 0.9 % IR SOLN
Status: DC | PRN
  Administered 2013-03-14 (×2): 1000 mL

## 2013-03-14 MED ORDER — ONDANSETRON HCL 4 MG/2ML IJ SOLN: 4.0000 mg | Freq: Once | INTRAMUSCULAR | Status: DC | PRN

## 2013-03-14 MED ORDER — ALPRAZOLAM 0.5 MG PO TABS
0.5000 mg | ORAL_TABLET | Freq: Every day | ORAL | Status: DC | PRN
  Administered 2013-03-14 – 2013-03-25 (×6): 0.5 mg via ORAL
  Filled 2013-03-14 (×6): qty 1

## 2013-03-14 MED ORDER — PROPOFOL 10 MG/ML IV BOLUS
INTRAVENOUS | Status: DC | PRN
  Administered 2013-03-14: 170 mg via INTRAVENOUS

## 2013-03-14 MED ORDER — HYDROMORPHONE HCL PF 1 MG/ML IJ SOLN
0.2500 mg | INTRAMUSCULAR | Status: DC | PRN
  Administered 2013-03-14 (×3): 0.5 mg via INTRAVENOUS

## 2013-03-14 MED ORDER — ALENDRONATE SODIUM 70 MG PO TABS: 70.0000 mg | ORAL_TABLET | ORAL | Status: DC

## 2013-03-14 MED ORDER — METOCLOPRAMIDE HCL 5 MG/ML IJ SOLN
10.0000 mg | Freq: Four times a day (QID) | INTRAMUSCULAR | Status: DC
  Administered 2013-03-14 – 2013-03-19 (×20): 10 mg via INTRAVENOUS
  Filled 2013-03-14 (×33): qty 2

## 2013-03-14 MED ORDER — IBUPROFEN 600 MG PO TABS
600.0000 mg | ORAL_TABLET | Freq: Four times a day (QID) | ORAL | Status: DC | PRN
  Administered 2013-03-19 – 2013-03-22 (×4): 600 mg via ORAL
  Filled 2013-03-14 (×4): qty 1

## 2013-03-14 MED ORDER — OXYCODONE HCL 5 MG/5ML PO SOLN: 5.0000 mg | Freq: Once | ORAL | Status: DC | PRN

## 2013-03-14 MED ORDER — ONDANSETRON HCL 4 MG/2ML IJ SOLN
INTRAMUSCULAR | Status: DC | PRN
  Administered 2013-03-14 (×2): 4 mg via INTRAVENOUS

## 2013-03-14 MED ORDER — BUPIVACAINE-EPINEPHRINE 0.25% -1:200000 IJ SOLN
INTRAMUSCULAR | Status: DC | PRN
  Administered 2013-03-14: 20 mL

## 2013-03-14 MED ORDER — PHENYLEPHRINE HCL 10 MG/ML IJ SOLN
INTRAMUSCULAR | Status: DC | PRN
  Administered 2013-03-14: 80 ug via INTRAVENOUS
  Administered 2013-03-14: 120 ug via INTRAVENOUS
  Administered 2013-03-14 (×2): 80 ug via INTRAVENOUS
  Administered 2013-03-14 (×2): 40 ug via INTRAVENOUS

## 2013-03-14 MED ORDER — ONDANSETRON HCL 4 MG PO TABS
4.0000 mg | ORAL_TABLET | Freq: Four times a day (QID) | ORAL | Status: DC | PRN
Start: 2013-03-14 — End: 2013-03-17

## 2013-03-14 MED ORDER — DEXAMETHASONE SODIUM PHOSPHATE 4 MG/ML IJ SOLN
INTRAMUSCULAR | Status: DC | PRN
  Administered 2013-03-14: 8 mg via INTRAVENOUS

## 2013-03-14 MED ORDER — CEFAZOLIN SODIUM-DEXTROSE 2-3 GM-% IV SOLR
INTRAVENOUS | Status: AC
  Filled 2013-03-14: qty 50

## 2013-03-14 SURGICAL SUPPLY — 44 items
APL SKNCLS STERI-STRIP NONHPOA (GAUZE/BANDAGES/DRESSINGS) ×1
APPLIER CLIP 5 13 M/L LIGAMAX5 (MISCELLANEOUS) ×2
APR CLP MED LRG 5 ANG JAW (MISCELLANEOUS) ×1
BAG SPEC RTRVL 10 TROC 200 (ENDOMECHANICALS) ×1
BAG SPEC RTRVL LRG 6X4 10 (ENDOMECHANICALS)
BANDAGE ADHESIVE 1X3 (GAUZE/BANDAGES/DRESSINGS) ×8 IMPLANT
BENZOIN TINCTURE PRP APPL 2/3 (GAUZE/BANDAGES/DRESSINGS) ×2 IMPLANT
CANISTER SUCTION 2500CC (MISCELLANEOUS) ×2 IMPLANT
CHLORAPREP W/TINT 26ML (MISCELLANEOUS) ×2 IMPLANT
CLIP APPLIE 5 13 M/L LIGAMAX5 (MISCELLANEOUS) ×1 IMPLANT
COVER MAYO STAND STRL (DRAPES) IMPLANT
COVER SURGICAL LIGHT HANDLE (MISCELLANEOUS) ×2 IMPLANT
DECANTER SPIKE VIAL GLASS SM (MISCELLANEOUS) ×1 IMPLANT
DRAPE C-ARM 42X72 X-RAY (DRAPES) IMPLANT
ELECT REM PT RETURN 9FT ADLT (ELECTROSURGICAL) ×2
ELECTRODE REM PT RTRN 9FT ADLT (ELECTROSURGICAL) ×1 IMPLANT
GLOVE BIO SURGEON STRL SZ7.5 (GLOVE) ×1 IMPLANT
GLOVE BIOGEL PI IND STRL 7.0 (GLOVE) IMPLANT
GLOVE BIOGEL PI IND STRL 7.5 (GLOVE) IMPLANT
GLOVE BIOGEL PI INDICATOR 7.0 (GLOVE) ×1
GLOVE BIOGEL PI INDICATOR 7.5 (GLOVE) ×1
GLOVE SURG SIGNA 7.5 PF LTX (GLOVE) ×2 IMPLANT
GLOVE SURG SS PI 7.0 STRL IVOR (GLOVE) ×1 IMPLANT
GOWN STRL NON-REIN LRG LVL3 (GOWN DISPOSABLE) ×5 IMPLANT
GOWN STRL REIN XL XLG (GOWN DISPOSABLE) ×2 IMPLANT
KIT BASIN OR (CUSTOM PROCEDURE TRAY) ×2 IMPLANT
KIT ROOM TURNOVER OR (KITS) ×2 IMPLANT
NS IRRIG 1000ML POUR BTL (IV SOLUTION) ×2 IMPLANT
PAD ARMBOARD 7.5X6 YLW CONV (MISCELLANEOUS) ×2 IMPLANT
POUCH RETRIEVAL ECOSAC 10 (ENDOMECHANICALS) IMPLANT
POUCH RETRIEVAL ECOSAC 10MM (ENDOMECHANICALS) ×1
POUCH SPECIMEN RETRIEVAL 10MM (ENDOMECHANICALS) IMPLANT
SCISSORS LAP 5X35 DISP (ENDOMECHANICALS) ×2 IMPLANT
SET CHOLANGIOGRAPH 5 50 .035 (SET/KITS/TRAYS/PACK) IMPLANT
SET IRRIG TUBING LAPAROSCOPIC (IRRIGATION / IRRIGATOR) ×2 IMPLANT
SLEEVE ENDOPATH XCEL 5M (ENDOMECHANICALS) ×4 IMPLANT
SPECIMEN JAR SMALL (MISCELLANEOUS) ×2 IMPLANT
SUT MON AB 4-0 PC3 18 (SUTURE) ×2 IMPLANT
TOWEL OR 17X24 6PK STRL BLUE (TOWEL DISPOSABLE) ×2 IMPLANT
TOWEL OR 17X26 10 PK STRL BLUE (TOWEL DISPOSABLE) ×2 IMPLANT
TOWEL OR NON WOVEN STRL DISP B (DISPOSABLE) ×1 IMPLANT
TRAY LAPAROSCOPIC (CUSTOM PROCEDURE TRAY) ×2 IMPLANT
TROCAR XCEL BLUNT TIP 100MML (ENDOMECHANICALS) ×2 IMPLANT
TROCAR XCEL NON-BLD 5MMX100MML (ENDOMECHANICALS) ×2 IMPLANT

## 2013-03-14 NOTE — Op Note (Signed)
Laparoscopic Cholecystectomy Procedure Note  Indications: This patient presents with symptomatic gallbladder disease and will undergo laparoscopic cholecystectomy.  Pre-operative Diagnosis: gallbladder polyps, chronic cholecystitis  Post-operative Diagnosis: Same  Surgeon: [name] A   Assistants: 0  Anesthesia: General endotracheal anesthesia  ASA Class: 2  Procedure Details  The patient was seen again in the Holding Room. The risks, benefits, complications, treatment options, and expected outcomes were discussed with the patient. The possibilities of reaction to medication, pulmonary aspiration, perforation of viscus, bleeding, recurrent infection, finding a normal gallbladder, the need for additional procedures, failure to diagnose a condition, the possible need to convert to an open procedure, and creating a complication requiring transfusion or operation were discussed with the patient. The likelihood of improving the patient's symptoms with return to their baseline status is good.  The patient and/or family concurred with the proposed plan, giving informed consent. The site of surgery properly noted. The patient was taken to Operating Room, identified as Terri Ayala and the procedure verified as Laparoscopic Cholecystectomy with Intraoperative Cholangiogram. A Time Out was held and the above information confirmed.  Prior to the induction of general anesthesia, antibiotic prophylaxis was administered. General endotracheal anesthesia was then administered and tolerated well. After the induction, the abdomen was prepped with Chloraprep and draped in sterile fashion. The patient was positioned in the supine position.  Local anesthetic agent was injected into the skin near the umbilicus and an incision made. We dissected down to the abdominal fascia with blunt dissection.  The fascia was incised vertically and we entered the peritoneal cavity bluntly.  A pursestring suture of 0-Vicryl  was placed around the fascial opening.  The Hasson cannula was inserted and secured with the stay suture.  Pneumoperitoneum was then created with CO2 and tolerated well without any adverse changes in the patient's vital signs. An 11-mm port was placed in the subxiphoid position.  Two 5-mm ports were placed in the right upper quadrant. All skin incisions were infiltrated with a local anesthetic agent before making the incision and placing the trocars.   We positioned the patient in reverse Trendelenburg, tilted slightly to the patient's left.  The patient was found to have a large, fatty liver. There were no signs of cirrhosis. There were no other visible intra-abdominal abnormalitiesThe gallbladder was identified, the fundus grasped and retracted cephalad. Adhesions were lysed bluntly and with the electrocautery where indicated, taking care not to injure any adjacent organs or viscus. The infundibulum was grasped and retracted laterally, exposing the peritoneum overlying the triangle of Calot. This was then divided and exposed in a blunt fashion. The cystic duct was clearly identified and bluntly dissected circumferentially. A critical view of the cystic duct and cystic artery was obtained.  I clipped the cystic duct proximally. I then made an opening with the scissors. A small vessel along the cystic duct was vigorously bleeding. I could not control this with the cautery so I had to completely clipped the cystic duct several times proximally and transect it and it forego a cholangiogram. The cystic duct was then ligated with clips and divided. The cystic artery was, dissected free, ligated with clips and divided as well.   The gallbladder was dissected from the liver bed in retrograde fashion with the electrocautery. The gallbladder was removed and placed in an Endocatch sac. The liver bed was irrigated and inspected. Hemostasis was achieved with the electrocautery. Copious irrigation was utilized and was  repeatedly aspirated until clear.  The gallbladder and Endocatch sac  were then removed through the umbilical port site.  The pursestring suture was used to close the umbilical fascia.    We again inspected the right upper quadrant for hemostasis.  Pneumoperitoneum was released as we removed the trocars.  4-0 Monocryl was used to close the skin.   Benzoin, steri-strips, and clean dressings were applied. The patient was then extubated and brought to the recovery room in stable condition. Instrument, sponge, and needle counts were correct at closure and at the conclusion of the case.   Findings: Enlarged, fatty liver. Gallbladder was thick walled with the findings consistent with chronic cholecystitis. I was unable to perform a cholangiogram  Estimated Blood Loss: Minimal         Drains: 0         Specimens: Gallbladder           Complications: None; patient tolerated the procedure well.         Disposition: PACU - hemodynamically stable.         Condition: stable

## 2013-03-14 NOTE — H&P (Signed)
Patient ID: Terri Ayala, female DOB: 24-Oct-1955, 58 y.o. MRN: [id number]  Chief Complaint   Patient presents with   .  Other     gallbladder polyps   HPI  Terri Ayala is a 58 y.o. female.  HPI  This is a pleasant female referred by Dr. Collene Mares. For many months, she has abdominal bloating, right-sided abdominal pain, nausea, and vomiting. She has even had some subtle symptoms for years. It is now acutely getting worse and she feels bad all the time. The pain stays in the epigastrium and right side of the abdomen. It is sharp and moderate in intensity. It occurs with just about anything she needs. She has had an extensive workup which is unremarkable except for gallbladder adenomatosis and elevated liver function tests.  Past Medical History   Diagnosis  Date   .  Low back pain    .  Anxiety    .  Migraines    .  GOITER, MULTINODULAR  08/17/2009   .  OSTEOPOROSIS  08/17/2009   .  ASYMPTOMATIC POSTMENOPAUSAL STATUS  08/17/2009   .  DYSPHAGIA  06/19/2007   .  Supraventricular tachycardia    .  Hyperlipidemia    .  Mitral valve prolapse     Past Surgical History   Procedure  Laterality  Date   .  Laporoscopic abdominal surgery     .  Cervix surgery      Family History   Problem  Relation  Age of Onset   .  Thyroid disease  Neg Hx    .  Goiter  Neg Hx    .  Cancer  Father    .  Osteoporosis  Mother    Social History  History   Substance Use Topics   .  Smoking status:  Never Smoker   .  Smokeless tobacco:  Never Used   .  Alcohol Use:  Yes    Allergies   Allergen  Reactions   .  Doxycycline  Other (See Comments)     Reaction unknown   .  Tetracycline Hcl  Other (See Comments)     Reaction unknown    Current Outpatient Prescriptions   Medication  Sig  Dispense  Refill   .  alendronate (FOSAMAX) 70 MG tablet  Take 1 tablet (70 mg total) by mouth every 7 (seven) days. Take with a full glass of water on an empty stomach.  4 tablet  11   .  ALPRAZolam (XANAX) 0.5 MG tablet  Take 1  tablet (0.5 mg total) by mouth daily as needed. For anxiety  90 tablet  1   .  aspirin 81 MG tablet  Take 81 mg by mouth daily.     .  calcium carbonate 200 MG capsule  Take 250 mg by mouth daily.     .  cholecalciferol (VITAMIN D) 1000 UNITS tablet  Take 1,000 Units by mouth daily.     [name] Kitchen  diltiazem (CARDIZEM CD) 240 MG 24 hr capsule  Take 1 capsule (240 mg total) by mouth daily.  30 capsule  11   .  glucosamine-chondroitin 500-400 MG tablet  Take 1 tablet by mouth daily.     .  metoCLOPramide (REGLAN) 10 MG tablet      .  norethindrone-ethinyl estradiol (FEMHRT 1/5) 1-5 MG-MCG TABS  Take 1 tablet by mouth daily.     .  ondansetron (ZOFRAN) 8 MG tablet      .  ondansetron (ZOFRAN-ODT) 8 MG disintegrating tablet      .  pantoprazole (PROTONIX) 40 MG tablet  Take 1 tablet (40 mg total) by mouth daily.  30 tablet  11   .  simvastatin (ZOCOR) 20 MG tablet  Take 1 tablet (20 mg total) by mouth at bedtime.  30 tablet  11   .  SUMAtriptan (IMITREX) 100 MG tablet  TAKE 1 TABLET (100 MG TOTAL) BY MOUTH EVERY 2 (TWO) HOURS AS NEEDED.  10 tablet  5   .  SUMAtriptan (IMITREX) 100 MG tablet  TAKE 1 TABLET (100 MG TOTAL) BY MOUTH EVERY 2 (TWO) HOURS AS NEEDED.  10 tablet  5   .  Ferrous Sulfate (IRON) 325 (65 FE) MG TABS  Take by mouth.      No current facility-administered medications for this visit.   Review of Systems  Review of Systems  Constitutional: Negative for fever, chills and unexpected weight change.  HENT: Negative for congestion, hearing loss, sore throat, trouble swallowing and voice change.  Eyes: Negative for visual disturbance.  Respiratory: Negative for cough and wheezing.  Cardiovascular: Negative for chest pain, palpitations and leg swelling.  Gastrointestinal: Positive for nausea, vomiting, abdominal pain, diarrhea and abdominal distention. Negative for constipation, blood in stool and anal bleeding.  Genitourinary: Negative for hematuria, vaginal bleeding and difficulty urinating.   Musculoskeletal: Negative for arthralgias.  Skin: Negative for rash and wound.  Neurological: Negative for seizures, syncope and headaches.  Hematological: Negative for adenopathy. Does not bruise/bleed easily.  Psychiatric/Behavioral: Negative for confusion.  Blood pressure 128/72, pulse 100, temperature 98 F (36.7 C), resp. rate 18, height 5\' 2"  (1.575 m), weight 123 lb (55.792 kg).  Physical Exam  Physical Exam  Constitutional: She is oriented to person, place, and time. She appears well-developed and well-nourished. No distress.  HENT:  Head: Normocephalic and atraumatic.  Right Ear: External ear normal.  Left Ear: External ear normal.  Nose: Nose normal.  Mouth/Throat: Oropharynx is clear and moist. No oropharyngeal exudate.  Eyes: Conjunctivae are normal. Pupils are equal, round, and reactive to light. Right eye exhibits no discharge. Left eye exhibits no discharge. No scleral icterus.  Neck: Normal range of motion. Neck supple. No tracheal deviation present. No thyromegaly present.  Cardiovascular: Regular rhythm, normal heart sounds and intact distal pulses.  No murmur heard. tachycardic  Pulmonary/Chest: Effort normal and breath sounds normal. No respiratory distress. She has no wheezes. She has no rales.  Abdominal: Soft. Bowel sounds are normal.  She has a vague fullness in the upper abdomen. She has mild to moderate tenderness in the epigastrium and right upper quadrant with mild guarding  Musculoskeletal: Normal range of motion. She exhibits no edema and no tenderness.  Lymphadenopathy:  She has no cervical adenopathy.  Neurological: She is alert and oriented to person, place, and time.  Skin: Skin is warm and dry. No rash noted. She is not diaphoretic. No erythema.  Psychiatric: Her behavior is normal. Judgment normal.  Data Reviewed  I have reviewed her multiple studies. Her CAT scan and upper endoscopy are unremarkable. Her ultrasound shows gallbladder adenomatosis.  Her ejection fraction is normal. Her liver function tests are elevated including alkaline phosphatase, AST and ALT. bilirubin is normal.  Her gastric emptying study at slightly increased  Assessment  Gallbladder adenomatosis  Plan  I am worried that this actually represent stones given her symptoms and her elevated liver function tests. I discussed this with her in detail. We discussed continued conservative  measures Versus laparoscopic cholecystectomy. I would recommend laparoscopic cholecystectomy with cholangiogram. I discussed the surgery with her in detail. I discussed the risks which includes but is not limited to bleeding, infection, bile duct injury, bile leak, injury to surrounding structures, and the chance this may not resolve her symptoms. I also discussed conversion to an open procedure and DVT. I then discussed postoperative recovery. She wished to proceed with laparoscopic cholecystectomy which will be scheduled

## 2013-03-14 NOTE — Anesthesia Preprocedure Evaluation (Signed)
Anesthesia Evaluation  Patient identified by MRN, date of birth, ID band Patient awake    Reviewed: Allergy & Precautions, H&P , NPO status , Patient's Chart, lab work & pertinent test results  History of Anesthesia Complications (+) PONV  Airway Mallampati: I TM Distance: >3 FB Neck ROM: Full    Dental  (+) Teeth Intact and Dental Advisory Given   Pulmonary  breath sounds clear to auscultation        Cardiovascular Rhythm:Regular Rate:Normal     Neuro/Psych    GI/Hepatic   Endo/Other    Renal/GU      Musculoskeletal   Abdominal   Peds  Hematology   Anesthesia Other Findings   Reproductive/Obstetrics                           Anesthesia Physical Anesthesia Plan  ASA: II  Anesthesia Plan: General   Post-op Pain Management:    Induction: Intravenous, Rapid sequence and Cricoid pressure planned  Airway Management Planned: Oral ETT  Additional Equipment:   Intra-op Plan:   Post-operative Plan: Extubation in OR  Informed Consent: I have reviewed the patients History and Physical, chart, labs and discussed the procedure including the risks, benefits and alternatives for the proposed anesthesia with the patient or authorized representative who has indicated his/her understanding and acceptance.   Dental advisory given  Plan Discussed with: CRNA, Anesthesiologist and Surgeon  Anesthesia Plan Comments:         Anesthesia Quick Evaluation

## 2013-03-14 NOTE — Anesthesia Procedure Notes (Signed)
Procedure Name: Intubation Date/Time: 03/14/2013 7:25 AM Performed by: Octavio Graves Pre-anesthesia Checklist: Patient identified, Emergency Drugs available, Suction available, Patient being monitored and Timeout performed Patient Re-evaluated:Patient Re-evaluated prior to inductionOxygen Delivery Method: Circle system utilized Preoxygenation: Pre-oxygenation with 100% oxygen Intubation Type: IV induction and Rapid sequence Laryngoscope Size: Miller and 2 Grade View: Grade I Tube type: Oral Tube size: 7.0 mm Number of attempts: 1 Airway Equipment and Method: Stylet Placement Confirmation: ETT inserted through vocal cords under direct vision and positive ETCO2 Secured at: 22 cm Tube secured with: Tape Dental Injury: Teeth and Oropharynx as per pre-operative assessment  Comments: IV induction Crews - Pt with slight chipped front teeth - prior to the intubation- AM CRNA intubation atraumatic teeth and mouth as preop

## 2013-03-14 NOTE — Progress Notes (Signed)
REPORT GIVEN TO ANGEL RN AS CAREGIVER

## 2013-03-14 NOTE — Interval H&P Note (Signed)
History and Physical Interval Note: she continues to feel poor and weak with nausea and vomiting.  We discussed proceeding with lap chole as it is felt that this is the source of her illness vs admission, rehydration, and lap chole next week.  After a discussion, she wishes to proceed with surgery.  03/14/2013 6:52 AM  Terri Ayala  has presented today for surgery, with the diagnosis of gallbladder polyps  The various methods of treatment have been discussed with the patient and family. After consideration of risks, benefits and other options for treatment, the patient has consented to  Procedure(s): LAPAROSCOPIC CHOLECYSTECTOMY WITH INTRAOPERATIVE CHOLANGIOGRAM (N/A) as a surgical intervention .  The patient's history has been reviewed, patient examined, no change in status, stable for surgery.  I have reviewed the patient's chart and labs.  Questions were answered to the patient's satisfaction.     Sedric Guia A

## 2013-03-14 NOTE — Progress Notes (Addendum)
When pt. Arrived to short stay  She had small amount (1-2 oz. ) of bile color emesis. Pt. Stating she has had this for a long time but past week is gotten worst. States she feels weak and dizzy when she gets up and feels like she might faint. Stated she called Dr. Ninfa Linden yesterday and he stated for her to try and come in tomorrow for surgery. I paged Dr. Ninfa Linden at [phone] this am. Spoke to Dr. Ninfa Linden at [phone] and informed him of pt's. Vomiting this am and her weakness/dizziness. No new orders.

## 2013-03-14 NOTE — Transfer of Care (Signed)
Immediate Anesthesia Transfer of Care Note  Patient: Terri Ayala  Procedure(s) Performed: Procedure(s): LAPAROSCOPIC CHOLECYSTECTOMY WITH ATTEMPTED INTRAOPERATIVE CHOLANGIOGRAM (N/A)  Patient Location: PACU  Anesthesia Type:General  Level of Consciousness: sedated  Airway & Oxygen Therapy: Patient Spontanous Breathing and Patient connected to nasal cannula oxygen  Post-op Assessment: Report given to PACU RN and Post -op Vital signs reviewed and stable  Post vital signs: Reviewed and stable  Complications: No apparent anesthesia complications

## 2013-03-14 NOTE — Preoperative (Signed)
Beta Blockers   Reason not to administer Beta Blockers:Not Applicable 

## 2013-03-14 NOTE — Anesthesia Postprocedure Evaluation (Signed)
  Anesthesia Post-op Note  Patient: Terri Ayala  Procedure(s) Performed: Procedure(s): LAPAROSCOPIC CHOLECYSTECTOMY WITH ATTEMPTED INTRAOPERATIVE CHOLANGIOGRAM (N/A)  Patient Location: PACU  Anesthesia Type:General  Level of Consciousness: awake, alert  and oriented  Airway and Oxygen Therapy: Patient Spontanous Breathing and Patient connected to nasal cannula oxygen  Post-op Pain: mild  Post-op Assessment: Post-op Vital signs reviewed  Post-op Vital Signs: Reviewed  Complications: No apparent anesthesia complications

## 2013-03-15 ENCOUNTER — Encounter (HOSPITAL_COMMUNITY): Payer: Self-pay | Admitting: Surgery

## 2013-03-15 LAB — CBC
HCT: 31.4 % — ABNORMAL LOW (ref 36.0–46.0)
Hemoglobin: 10.5 g/dL — ABNORMAL LOW (ref 12.0–15.0)
MCH: 34.5 pg — AB (ref 26.0–34.0)
MCHC: 33.4 g/dL (ref 30.0–36.0)
MCV: 103.3 fL — AB (ref 78.0–100.0)
PLATELETS: 184 10*3/uL (ref 150–400)
RBC: 3.04 MIL/uL — AB (ref 3.87–5.11)
RDW: 13.5 % (ref 11.5–15.5)
WBC: 9.3 10*3/uL (ref 4.0–10.5)

## 2013-03-15 LAB — COMPREHENSIVE METABOLIC PANEL
ALBUMIN: 2.6 g/dL — AB (ref 3.5–5.2)
ALK PHOS: 238 U/L — AB (ref 39–117)
ALT: 35 U/L (ref 0–35)
AST: 187 U/L — AB (ref 0–37)
BILIRUBIN TOTAL: 2.2 mg/dL — AB (ref 0.3–1.2)
BUN: 3 mg/dL — ABNORMAL LOW (ref 6–23)
CO2: 22 meq/L (ref 19–32)
CREATININE: 0.49 mg/dL — AB (ref 0.50–1.10)
Calcium: 8.2 mg/dL — ABNORMAL LOW (ref 8.4–10.5)
Chloride: 104 mEq/L (ref 96–112)
GFR calc Af Amer: >90 mL/min (ref >90)
GFR calc non Af Amer: >90 mL/min (ref >90)
Glucose, Bld: 134 mg/dL — ABNORMAL HIGH (ref 70–99)
POTASSIUM: 3.6 meq/L — AB (ref 3.7–5.3)
SODIUM: 142 meq/L (ref 137–147)
Total Protein: 5.9 g/dL — ABNORMAL LOW (ref 6.0–8.3)

## 2013-03-15 NOTE — Consult Note (Signed)
Reason for Consult: Nausea/Vomiting and ABM pain Referring Physician: [name], M.D.  Terri Ayala HPI: This is a 58 year old female admitted for persistent right sided abdominal pain.  The pain started last year and it has progressively worsened.  She was evaluated by Dr. Collene Mares in the office and the work up was negative for any overt source.  The HIDA was negative and the gastric emptying scan only revealed a very mild delay in gastric emptying.  The decision was made to pursue a lap chole and the gallbladder wall was mildly thickened.  Per Dr. Ninfa Linden her liver was very dense with fatty infiltration and enlarged.  Recently her liver enzymes increased with an obstructive pattern.  The CT scan confirms the severe fatty infiltration, but no evidence of ductal dilation or stones.  A MRCP is pending for today.  She denies any significant weight loss with her nausea and vomiting.  Per her report, her hyperlipidemia is controlled with simvastatin.  Past Medical History  Diagnosis Date  . Low back pain   . Anxiety   . Migraines   . GOITER, MULTINODULAR 08/17/2009  . OSTEOPOROSIS 08/17/2009  . ASYMPTOMATIC POSTMENOPAUSAL STATUS 08/17/2009  . DYSPHAGIA 06/19/2007  . Supraventricular tachycardia   . Hyperlipidemia   . Mitral valve prolapse   . PONV (postoperative nausea and vomiting)     Past Surgical History  Procedure Laterality Date  . Laporoscopic abdominal surgery    . Cervix surgery      Family History  Problem Relation Age of Onset  . Thyroid disease Neg Hx   . Goiter Neg Hx   . Cancer Father   . Osteoporosis Mother     Social History:  reports that she has never smoked. She has never used smokeless tobacco. She reports that she drinks alcohol. She reports that she does not use illicit drugs.  Allergies:  Allergies  Allergen Reactions  . Doxycycline Other (See Comments)    Reaction unknown  . Tetracycline Hcl Other (See Comments)    Reaction unknown    Medications:   Scheduled: . diltiazem  240 mg Oral Daily  . enoxaparin (LOVENOX) injection  40 mg Subcutaneous Q24H  . influenza vac split quadrivalent PF  0.5 mL Intramuscular Tomorrow-1000  . metoCLOPramide (REGLAN) injection  10 mg Intravenous Q6H  . pantoprazole  40 mg Oral Daily   Continuous: . dextrose 5 % and 0.9 % NaCl with KCl 20 mEq/L 125 mL/hr (03/15/13 1008)    Results for orders placed during the hospital encounter of 03/14/13 (from the past 24 hour(s))  COMPREHENSIVE METABOLIC PANEL     Status: Abnormal   Collection Time    03/15/13  4:03 AM      Result Value Range   Sodium 142  137 - 147 mEq/L   Potassium 3.6 (*) 3.7 - 5.3 mEq/L   Chloride 104  96 - 112 mEq/L   CO2 22  19 - 32 mEq/L   Glucose, Bld 134 (*) 70 - 99 mg/dL   BUN 3 (*) 6 - 23 mg/dL   Creatinine, Ser 0.49 (*) 0.50 - 1.10 mg/dL   Calcium 8.2 (*) 8.4 - 10.5 mg/dL   Total Protein 5.9 (*) 6.0 - 8.3 g/dL   Albumin 2.6 (*) 3.5 - 5.2 g/dL   AST 187 (*) 0 - 37 U/L   ALT 35  0 - 35 U/L   Alkaline Phosphatase 238 (*) 39 - 117 U/L   Total Bilirubin 2.2 (*) 0.3 -  1.2 mg/dL   GFR calc non Af Amer >90  >90 mL/min   GFR calc Af Amer >90  >90 mL/min  CBC     Status: Abnormal   Collection Time    03/15/13  4:03 AM      Result Value Range   WBC 9.3  4.0 - 10.5 K/uL   RBC 3.04 (*) 3.87 - 5.11 MIL/uL   Hemoglobin 10.5 (*) 12.0 - 15.0 g/dL   HCT 31.4 (*) 36.0 - 46.0 %   MCV 103.3 (*) 78.0 - 100.0 fL   MCH 34.5 (*) 26.0 - 34.0 pg   MCHC 33.4  30.0 - 36.0 g/dL   RDW 13.5  11.5 - 15.5 %   Platelets 184  150 - 400 K/uL     No results found.  ROS:  As stated above in the HPI otherwise negative.  Blood pressure 121/79, pulse 99, temperature 98.5 F (36.9 C), temperature source Oral, resp. rate 18, height 5\' 2"  (1.575 m), weight 124 lb (56.246 kg), SpO2 98.00%.    PE: Gen: Actively vomiting, Alert and Oriented HEENT:  [place]/AT, EOMI Neck: Supple, no LAD Lungs: CTA Bilaterally CV: RRR without M/G/R ABM: Soft, tender at the  incision sites, difficult to ascertain for hepatomegaly, +BS Ext: No C/C/E  Assessment/Plan: 1) Vague abdominal discomfort.  2) Nausea/Vomiting. 3) Severe fatty liver infiltration with hepatomegaly.   I am uncertain if her fatty liver with hepatomegaly is the source of her symptoms.  The hepatomegaly is stretching her liver capsule.  I had a patient with very similar presentation in the past with elevated liver enzymes.  After weight loss her symptoms of nausea and vomiting resolved.  This may be the same issue with this patient.  Her MRCP is pending for tomorrow AM.  I will be interested to see if there is are any stones and to see where her liver enzymes shows.  Plan: 1) MRCP. 2) Follow liver enzymes. 3) Supportive care.  Terri Ayala D 03/15/2013, 10:19 AM

## 2013-03-15 NOTE — Progress Notes (Signed)
1 Day Post-Op  Subjective: She remains very nauseated, same as preop  Objective: Vital signs in last 24 hours: Temp:  [97.5 F (36.4 C)-98.5 F (36.9 C)] 98.5 F (36.9 C) (01/16 0600) Pulse Rate:  [88-106] 99 (01/16 0600) Resp:  [6-31] 18 (01/16 0600) BP: (109-141)/(41-102) 121/79 mmHg (01/16 0600) SpO2:  [98 %-100 %] 98 % (01/16 0600) Weight:  [124 lb (56.246 kg)] 124 lb (56.246 kg) (01/15 1352) Last BM Date: 03/13/13  Intake/Output from previous day: 01/15 0701 - 01/16 0700 In: 1260 [P.O.:60; I.V.:1200] Out: 16 [Emesis/NG output:1; Blood:15] Intake/Output this shift: Total I/O In: 60 [P.O.:60] Out: -   Lungs clear Abdomen soft, appropriately tender  Lab Results:   Recent Labs  03/15/13 0403  WBC 9.3  HGB 10.5*  HCT 31.4*  PLT 184   BMET  Recent Labs  03/15/13 0403  NA 142  K 3.6*  CL 104  CO2 22  GLUCOSE 134*  BUN 3*  CREATININE 0.49*  CALCIUM 8.2*   PT/INR No results found for this basename: LABPROT, INR,  in the last 72 hours ABG No results found for this basename: PHART, PCO2, PO2, HCO3,  in the last 72 hours  Studies/Results: No results found.  Anti-infectives: Anti-infectives   Start     Dose/Rate Route Frequency Ordered Stop   03/14/13 0600  ceFAZolin (ANCEF) IVPB 2 g/50 mL premix     2 g 100 mL/hr over 30 Minutes Intravenous On call to O.R. 03/13/13 1359 03/14/13 0727      Assessment/Plan: s/p Procedure(s): LAPAROSCOPIC CHOLECYSTECTOMY WITH ATTEMPTED INTRAOPERATIVE CHOLANGIOGRAM (N/A)  preop nausea of uncertain etiology  LFT's remain elevated but slightly less than preop.  I was unable to perform a cholangiogram at the time of surgery.  She also had a very enlarged, fatty liver. Will check an MRCP and consult GI (Dr. Collene Mares) who referred her to me for evaluation/assistence.  LOS: 1 day    Graceson Nichelson A 03/15/2013

## 2013-03-15 NOTE — Progress Notes (Signed)
UR completed. Patient changed to inpatient r/t requiring IV antibiotics and IVF

## 2013-03-16 ENCOUNTER — Inpatient Hospital Stay (HOSPITAL_COMMUNITY): Payer: Federal, State, Local not specified - PPO

## 2013-03-16 DIAGNOSIS — K760 Fatty (change of) liver, not elsewhere classified: Secondary | ICD-10-CM

## 2013-03-16 DIAGNOSIS — R7402 Elevation of levels of lactic acid dehydrogenase (LDH): Secondary | ICD-10-CM

## 2013-03-16 DIAGNOSIS — R740 Nonspecific elevation of levels of transaminase and lactic acid dehydrogenase [LDH]: Secondary | ICD-10-CM

## 2013-03-16 DIAGNOSIS — R7401 Elevation of levels of liver transaminase levels: Secondary | ICD-10-CM

## 2013-03-16 DIAGNOSIS — K7689 Other specified diseases of liver: Secondary | ICD-10-CM

## 2013-03-16 DIAGNOSIS — R112 Nausea with vomiting, unspecified: Secondary | ICD-10-CM

## 2013-03-16 LAB — COMPREHENSIVE METABOLIC PANEL
ALT: 41 U/L — ABNORMAL HIGH (ref 0–35)
AST: 193 U/L — AB (ref 0–37)
Albumin: 2.8 g/dL — ABNORMAL LOW (ref 3.5–5.2)
Alkaline Phosphatase: 246 U/L — ABNORMAL HIGH (ref 39–117)
BUN: <3 mg/dL — ABNORMAL LOW (ref 6–23)
CALCIUM: 7.7 mg/dL — AB (ref 8.4–10.5)
CO2: 23 meq/L (ref 19–32)
Chloride: 100 mEq/L (ref 96–112)
Creatinine, Ser: 0.35 mg/dL — ABNORMAL LOW (ref 0.50–1.10)
GFR calc Af Amer: >90 mL/min (ref >90)
Glucose, Bld: 167 mg/dL — ABNORMAL HIGH (ref 70–99)
Potassium: 3 mEq/L — ABNORMAL LOW (ref 3.7–5.3)
Sodium: 141 mEq/L (ref 137–147)
TOTAL PROTEIN: 5.9 g/dL — AB (ref 6.0–8.3)
Total Bilirubin: 2.3 mg/dL — ABNORMAL HIGH (ref 0.3–1.2)

## 2013-03-16 LAB — GLUCOSE, CAPILLARY: Glucose-Capillary: 171 mg/dL — ABNORMAL HIGH (ref 70–99)

## 2013-03-16 MED ORDER — ONDANSETRON HCL 4 MG/2ML IJ SOLN
INTRAMUSCULAR | Status: AC
  Administered 2013-03-16: 4 mg via INTRAVENOUS
  Filled 2013-03-16: qty 2

## 2013-03-16 MED ORDER — LORAZEPAM 2 MG/ML IJ SOLN
0.5000 mg | INTRAMUSCULAR | Status: DC | PRN
  Administered 2013-03-16 – 2013-03-22 (×5): 0.5 mg via INTRAVENOUS
  Filled 2013-03-16 (×4): qty 1

## 2013-03-16 MED ORDER — GADOBENATE DIMEGLUMINE 529 MG/ML IV SOLN
10.0000 mL | Freq: Once | INTRAVENOUS | Status: AC | PRN
  Administered 2013-03-16: 10 mL via INTRAVENOUS

## 2013-03-16 MED ORDER — LORAZEPAM 2 MG/ML IJ SOLN
INTRAMUSCULAR | Status: AC
  Administered 2013-03-16: 0.5 mg via INTRAVENOUS
  Filled 2013-03-16: qty 1

## 2013-03-16 NOTE — Progress Notes (Signed)
Patient ID: Terri Ayala, female   DOB: 1955/05/31, 58 y.o.   MRN: [id number]    Covering for Dr. Benson Norway   Progress Note   Subjective  Abdominal pain, back pain, nausea and dry heaves Day # 2 post lap chole   Objective   Vital signs in last 24 hours: Temp:  [98 F (36.7 C)-98.6 F (37 C)] 98 F (36.7 C) (01/17 0412) Pulse Rate:  [89-108] 102 (01/17 1032) Resp:  [18] 18 (01/17 0412) BP: (115-144)/(59-97) 128/81 mmHg (01/17 1032) SpO2:  [98 %-100 %] 98 % (01/17 0412) Last BM Date: 03/13/13 General: thin, white female in NAD Heart:  Regular rate and rhythm; no murmurs Lungs: Respirations even and unlabored, lungs CTA bilaterally Abdomen:  Soft, tender rather diffusely  upper abdomenand nondistended. Normal bowel sounds. Extremities:  Without edema. Neurologic:  Alert and oriented,  grossly normal neurologically. Psych:  Cooperative. Normal mood and affect.  Intake/Output from previous day: 01/16 0701 - 01/17 0700 In: 1750 [P.O.:250; I.V.:1500] Out: 1 [Emesis/NG output:1] Intake/Output this shift: Total I/O In: 470.8 [I.V.:470.8] Out: -   Lab Results:  Recent Labs  03/15/13 0403  WBC 9.3  HGB 10.5*  HCT 31.4*  PLT 184   BMET  Recent Labs  03/15/13 0403 03/16/13 0416  NA 142 141  K 3.6* 3.0*  CL 104 100  CO2 22 23  GLUCOSE 134* 167*  BUN 3* <3*  CREATININE 0.49* 0.35*  CALCIUM 8.2* 7.7*   LFT  Recent Labs  03/14/13 0605  03/16/13 0416  PROT 7.4  < > 5.9*  ALBUMIN 3.6  < > 2.8*  AST 169*  < > 193*  ALT 37*  < > 41*  ALKPHOS 323*  < > 246*  BILITOT 2.8*  < > 2.3*  BILIDIR 1.6*  --   --   IBILI 1.2*  --   --   < > = values in this interval not displayed.   Studies/Results: MRCP pending No results found.     Assessment / Plan:   #1 58 yo female with chronic abdominal pain and nausea complaints-now day # 2 s/p lap chole for same. Maybe a little better Etiology of her sxs not clear- out pt workup last year unremarkable except mild  gastroparesis #2 Fatty liver, elevated LFT's-for MRCP today  Will follow up MRCP Continue antiemetics, PPI and Reglan  Active Problems:   Chronic cholecystitis    LOS: 2 days   Amy Esterwood  03/16/2013, 11:56 AM     Attending physician's note   I have taken an interval history, reviewed the chart and examined the patient. I agree with the Advanced Practitioner's note, impression and recommendations. Pain, N/V-maybe slightly improved. Fatty liver and elevated LFTs. Awaiting MRCP ordered for today. Continue current GI meds.  Pricilla Riffle. Fuller Plan, MD [facility]

## 2013-03-16 NOTE — Progress Notes (Signed)
Reported by day RN pt was disoriented and hallucinating after a couple doses of IV Dilaudid and Phenergan. Suggested to avoid Dilaudid if possible. Pt A&O to self and situation, disoriented to place and time. One dose of Phenergan given last night at 2124 for nausea and dry heaves. Pt during night has attempted to get out of bed without asking for assistance. Instructed multiple times to call for help to get OOB. Bed alarm on. Oriented to self only after Phenergan given. Unsteady gait. Plan to avoid Phenergan if possible, only using Zofran and Reglan. Pt c/o only soreness in abd, no severe pain. Will try to avoid Dilaudid as well.   Pt had 1 medium, brown, soft stool earlier in shift. At [place] pt had large green, watery, loose stool. Minimal c/o nausea at this time. Only "soreness" in abd, tender around incisions.   At 0410 pt c/o dizziness, lightheaded saying "the room is spinning". Pt began to sway in a circle, repeating "I'm dizzy, I'm going to pass out" and she layed back in bed. Pt never lost consciousness. Pale, diaphoretic. BP 144/97, HR 108, O2 sat 98 on RA. Rapid response RN called, assessed pt. After discussion of pt with rapid RN this episode appears to be anxiety related. MD on call notified and informed of disorientation/confusion as well as this episode. Order received for Ativan IV since pt is NPO. It is possible pt is experiencing withdrawal from benzo's. Pt takes Xanax 0.5mg  daily at home consistently however she has not received this while here as she cannot keep anything down with her continued vomiting. Ativan given, pt currently feels more calm and resting in bed. Will continue to monitor.

## 2013-03-16 NOTE — Progress Notes (Signed)
2 Days Post-Op  Subjective: Still complains of nausea and abdominal pain  Objective: Vital signs in last 24 hours: Temp:  [98 F (36.7 C)-98.6 F (37 C)] 98 F (36.7 C) (01/17 0412) Pulse Rate:  [89-108] 102 (01/17 1032) Resp:  [18] 18 (01/17 0412) BP: (115-144)/(59-97) 128/81 mmHg (01/17 1032) SpO2:  [98 %-100 %] 98 % (01/17 0412) Last BM Date: 03/13/13  Intake/Output from previous day: 01/16 0701 - 01/17 0700 In: 1750 [P.O.:250; I.V.:1500] Out: 1 [Emesis/NG output:1] Intake/Output this shift: Total I/O In: 470.8 [I.V.:470.8] Out: -   Resp: clear to auscultation bilaterally Cardio: regular rate and rhythm GI: soft, mild tenderness  Lab Results:   Recent Labs  03/15/13 0403  WBC 9.3  HGB 10.5*  HCT 31.4*  PLT 184   BMET  Recent Labs  03/15/13 0403 03/16/13 0416  NA 142 141  K 3.6* 3.0*  CL 104 100  CO2 22 23  GLUCOSE 134* 167*  BUN 3* <3*  CREATININE 0.49* 0.35*  CALCIUM 8.2* 7.7*   PT/INR No results found for this basename: LABPROT, INR,  in the last 72 hours ABG No results found for this basename: PHART, PCO2, PO2, HCO3,  in the last 72 hours  Studies/Results: No results found.  Anti-infectives: Anti-infectives   Start     Dose/Rate Route Frequency Ordered Stop   03/14/13 0600  ceFAZolin (ANCEF) IVPB 2 g/50 mL premix     2 g 100 mL/hr over 30 Minutes Intravenous On call to O.R. 03/13/13 1359 03/14/13 0727      Assessment/Plan: s/p Procedure(s): LAPAROSCOPIC CHOLECYSTECTOMY WITH ATTEMPTED INTRAOPERATIVE CHOLANGIOGRAM (N/A) Plan for MRCP to evaluate persistently elevated liver functions GI following  LOS: 2 days    TOTH III,Evelynn Hench S 03/16/2013

## 2013-03-17 LAB — GLUCOSE, CAPILLARY: GLUCOSE-CAPILLARY: 148 mg/dL — AB (ref 70–99)

## 2013-03-17 MED ORDER — ONDANSETRON HCL 4 MG PO TABS
4.0000 mg | ORAL_TABLET | Freq: Three times a day (TID) | ORAL | Status: DC
  Administered 2013-03-17 – 2013-03-26 (×27): 4 mg via ORAL
  Filled 2013-03-17 (×38): qty 1

## 2013-03-17 MED ORDER — ONDANSETRON HCL 4 MG/2ML IJ SOLN
4.0000 mg | Freq: Three times a day (TID) | INTRAMUSCULAR | Status: DC
  Administered 2013-03-17 – 2013-03-22 (×6): 4 mg via INTRAVENOUS
  Filled 2013-03-17 (×6): qty 2

## 2013-03-17 NOTE — Progress Notes (Signed)
3 Days Post-Op  Subjective: Still having significant nausea. MRCP was neg  Objective: Vital signs in last 24 hours: Temp:  [97.6 F (36.4 C)-98.4 F (36.9 C)] 98.3 F (36.8 C) (01/18 0603) Pulse Rate:  [88-104] 88 (01/18 0603) Resp:  [18-20] 18 (01/18 0603) BP: (106-131)/(65-78) 106/70 mmHg (01/18 0603) SpO2:  [100 %] 100 % (01/18 0603) Last BM Date: 03/16/13  Intake/Output from previous day: 01/17 0701 - 01/18 0700 In: 1310.4 [I.V.:1310.4] Out: 2 [Urine:1; Stool:1] Intake/Output this shift:    Resp: clear to auscultation bilaterally Cardio: regular rate and rhythm GI: soft, mild tenderness. incisions look good. good bs  Lab Results:   Recent Labs  03/15/13 0403  WBC 9.3  HGB 10.5*  HCT 31.4*  PLT 184   BMET  Recent Labs  03/15/13 0403 03/16/13 0416  NA 142 141  K 3.6* 3.0*  CL 104 100  CO2 22 23  GLUCOSE 134* 167*  BUN 3* <3*  CREATININE 0.49* 0.35*  CALCIUM 8.2* 7.7*   PT/INR No results found for this basename: LABPROT, INR,  in the last 72 hours ABG No results found for this basename: PHART, PCO2, PO2, HCO3,  in the last 72 hours  Studies/Results: Mr 3d Recon At Scanner  03/16/2013   CLINICAL DATA:  Abdominal pain. Nausea and vomiting. Status post cholecystectomy.  EXAM: MR 3D RECON AT SCANNER; MR ABDOMEN WO/W CM MRCP  TECHNIQUE: Multiplanar multisequence MR imaging of the abdomen was performed, including heavily T2-weighted images of the biliary and pancreatic ducts. Three-dimensional MR images were rendered by post processing of the original MR data.  CONTRAST:  78mL MULTIHANCE GADOBENATE DIMEGLUMINE 529 MG/ML IV SOLN  COMPARISON:  CT of the abdomen and pelvis 01/29/2013. Abdominal ultrasound 02/01/2013.  FINDINGS: MRCP images are suboptimal related to large amount of patient respiratory motion. Despite these limitations, there is no evidence of a intra or extrahepatic biliary ductal dilatation. No pancreatic ductal dilatation. There are no definite  filling defects within the common bile duct on any of the pulse sequences of today's examination to suggest presence of the retained ductal stone. No focal hepatic lesions are identified. Profound loss of signal intensity throughout the liver on out of phase imaging, compatible with severe hepatic steatosis.  The appearance of the visualized pancreas, spleen, bilateral adrenal glands and the right kidney is unremarkable. 1.2 cm lesion in the anterior aspect of the interpolar region of the left kidney is low signal intensity on T1 weighted images, high signal intensity on T2 weighted images, and does not enhance, compatible with a small simple cyst. Trace amount of ascites around both the liver and the spleen. Trace bilateral pleural effusions (right greater than left).  IMPRESSION: 1. Despite the limitations of today's MRCP images, there is no intra or extrahepatic biliary ductal dilatation, and no evidence of retained ductal stone. 2. Severe hepatic steatosis redemonstrated. 3. Trace volume of perihepatic and perisplenic ascites. 4. Trace bilateral pleural effusions (right greater than left).   Electronically Signed   By: Vinnie Langton M.D.   On: 03/16/2013 18:01   Mr Abd W/wo Cm/mrcp  03/16/2013   CLINICAL DATA:  Abdominal pain. Nausea and vomiting. Status post cholecystectomy.  EXAM: MR 3D RECON AT SCANNER; MR ABDOMEN WO/W CM MRCP  TECHNIQUE: Multiplanar multisequence MR imaging of the abdomen was performed, including heavily T2-weighted images of the biliary and pancreatic ducts. Three-dimensional MR images were rendered by post processing of the original MR data.  CONTRAST:  29mL MULTIHANCE GADOBENATE DIMEGLUMINE 529  MG/ML IV SOLN  COMPARISON:  CT of the abdomen and pelvis 01/29/2013. Abdominal ultrasound 02/01/2013.  FINDINGS: MRCP images are suboptimal related to large amount of patient respiratory motion. Despite these limitations, there is no evidence of a intra or extrahepatic biliary ductal  dilatation. No pancreatic ductal dilatation. There are no definite filling defects within the common bile duct on any of the pulse sequences of today's examination to suggest presence of the retained ductal stone. No focal hepatic lesions are identified. Profound loss of signal intensity throughout the liver on out of phase imaging, compatible with severe hepatic steatosis.  The appearance of the visualized pancreas, spleen, bilateral adrenal glands and the right kidney is unremarkable. 1.2 cm lesion in the anterior aspect of the interpolar region of the left kidney is low signal intensity on T1 weighted images, high signal intensity on T2 weighted images, and does not enhance, compatible with a small simple cyst. Trace amount of ascites around both the liver and the spleen. Trace bilateral pleural effusions (right greater than left).  IMPRESSION: 1. Despite the limitations of today's MRCP images, there is no intra or extrahepatic biliary ductal dilatation, and no evidence of retained ductal stone. 2. Severe hepatic steatosis redemonstrated. 3. Trace volume of perihepatic and perisplenic ascites. 4. Trace bilateral pleural effusions (right greater than left).   Electronically Signed   By: Vinnie Langton M.D.   On: 03/16/2013 18:01    Anti-infectives: Anti-infectives   Start     Dose/Rate Route Frequency Ordered Stop   03/14/13 0600  ceFAZolin (ANCEF) IVPB 2 g/50 mL premix     2 g 100 mL/hr over 30 Minutes Intravenous On call to O.R. 03/13/13 1359 03/14/13 0727      Assessment/Plan: s/p Procedure(s): LAPAROSCOPIC CHOLECYSTECTOMY WITH ATTEMPTED INTRAOPERATIVE CHOLANGIOGRAM (N/A) still no good explanation for persistent nausea. Would defer to GI Continue to encourage po's  LOS: 3 days    TOTH III,Terrisa Curfman S 03/17/2013

## 2013-03-17 NOTE — Progress Notes (Addendum)
Covering for Dr. Benson Norway  Progress Note   Subjective  Feels about the same. Persistent nausea and dry heaves.   Objective  Vital signs in last 24 hours: Temp:  [97.6 F (36.4 C)-98.4 F (36.9 C)] 98.3 F (36.8 C) (01/18 0603) Pulse Rate:  [88-104] 88 (01/18 0603) Resp:  [18-20] 18 (01/18 0603) BP: (106-131)/(65-81) 106/70 mmHg (01/18 0603) SpO2:  [100 %] 100 % (01/18 0603) Last BM Date: 03/16/13  General:   Alert, well-developed, thin, chronically ill appearing female  Heart:  Regular rate and rhythm; no murmurs Abdomen:  Soft, nontender and nondistended. Normal bowel sounds, without guarding, and without rebound.   Extremities:  Without edema. Neurologic:  Alert and  oriented x4;  grossly normal neurologically. Psych:  Alert and cooperative. Normal mood and affect.  Intake/Output from previous day: 01/17 0701 - 01/18 0700 In: 1310.4 [I.V.:1310.4] Out: 2 [Urine:1; Stool:1] Intake/Output this shift:    Lab Results:  Recent Labs  03/15/13 0403  WBC 9.3  HGB 10.5*  HCT 31.4*  PLT 184   BMET  Recent Labs  03/15/13 0403 03/16/13 0416  NA 142 141  K 3.6* 3.0*  CL 104 100  CO2 22 23  GLUCOSE 134* 167*  BUN 3* <3*  CREATININE 0.49* 0.35*  CALCIUM 8.2* 7.7*   LFT  Recent Labs  03/16/13 0416  PROT 5.9*  ALBUMIN 2.8*  AST 193*  ALT 41*  ALKPHOS 246*  BILITOT 2.3*   PT/INR No results found for this basename: LABPROT, INR,  in the last 72 hours Hepatitis Panel No results found for this basename: HEPBSAG, HCVAB, HEPAIGM, HEPBIGM,  in the last 72 hours  Studies/Results: Mr 3d Recon At Scanner  03/16/2013   CLINICAL DATA:  Abdominal pain. Nausea and vomiting. Status post cholecystectomy.  EXAM: MR 3D RECON AT SCANNER; MR ABDOMEN WO/W CM MRCP  TECHNIQUE: Multiplanar multisequence MR imaging of the abdomen was performed, including heavily T2-weighted images of the biliary and pancreatic ducts. Three-dimensional MR images were rendered by post processing of  the original MR data.  CONTRAST:  48mL MULTIHANCE GADOBENATE DIMEGLUMINE 529 MG/ML IV SOLN  COMPARISON:  CT of the abdomen and pelvis 01/29/2013. Abdominal ultrasound 02/01/2013.  FINDINGS: MRCP images are suboptimal related to large amount of patient respiratory motion. Despite these limitations, there is no evidence of a intra or extrahepatic biliary ductal dilatation. No pancreatic ductal dilatation. There are no definite filling defects within the common bile duct on any of the pulse sequences of today's examination to suggest presence of the retained ductal stone. No focal hepatic lesions are identified. Profound loss of signal intensity throughout the liver on out of phase imaging, compatible with severe hepatic steatosis.  The appearance of the visualized pancreas, spleen, bilateral adrenal glands and the right kidney is unremarkable. 1.2 cm lesion in the anterior aspect of the interpolar region of the left kidney is low signal intensity on T1 weighted images, high signal intensity on T2 weighted images, and does not enhance, compatible with a small simple cyst. Trace amount of ascites around both the liver and the spleen. Trace bilateral pleural effusions (right greater than left).  IMPRESSION: 1. Despite the limitations of today's MRCP images, there is no intra or extrahepatic biliary ductal dilatation, and no evidence of retained ductal stone. 2. Severe hepatic steatosis redemonstrated. 3. Trace volume of perihepatic and perisplenic ascites. 4. Trace bilateral pleural effusions (right greater than left).   Electronically Signed   By: Vinnie Langton M.D.   On:  03/16/2013 18:01   Mr Abd W/wo Cm/mrcp  03/16/2013   CLINICAL DATA:  Abdominal pain. Nausea and vomiting. Status post cholecystectomy.  EXAM: MR 3D RECON AT SCANNER; MR ABDOMEN WO/W CM MRCP  TECHNIQUE: Multiplanar multisequence MR imaging of the abdomen was performed, including heavily T2-weighted images of the biliary and pancreatic ducts.  Three-dimensional MR images were rendered by post processing of the original MR data.  CONTRAST:  14mL MULTIHANCE GADOBENATE DIMEGLUMINE 529 MG/ML IV SOLN  COMPARISON:  CT of the abdomen and pelvis 01/29/2013. Abdominal ultrasound 02/01/2013.  FINDINGS: MRCP images are suboptimal related to large amount of patient respiratory motion. Despite these limitations, there is no evidence of a intra or extrahepatic biliary ductal dilatation. No pancreatic ductal dilatation. There are no definite filling defects within the common bile duct on any of the pulse sequences of today's examination to suggest presence of the retained ductal stone. No focal hepatic lesions are identified. Profound loss of signal intensity throughout the liver on out of phase imaging, compatible with severe hepatic steatosis.  The appearance of the visualized pancreas, spleen, bilateral adrenal glands and the right kidney is unremarkable. 1.2 cm lesion in the anterior aspect of the interpolar region of the left kidney is low signal intensity on T1 weighted images, high signal intensity on T2 weighted images, and does not enhance, compatible with a small simple cyst. Trace amount of ascites around both the liver and the spleen. Trace bilateral pleural effusions (right greater than left).  IMPRESSION: 1. Despite the limitations of today's MRCP images, there is no intra or extrahepatic biliary ductal dilatation, and no evidence of retained ductal stone. 2. Severe hepatic steatosis redemonstrated. 3. Trace volume of perihepatic and perisplenic ascites. 4. Trace bilateral pleural effusions (right greater than left).   Electronically Signed   By: Vinnie Langton M.D.   On: 03/16/2013 18:01     Assessment & Plan   1. Chronic abdominal pain, nausea and vomiting-day # 3 s/p lap chole for same. No improvement since yesterday. MRCP without biliary abnormalities. Etiology of her sxs not clear-out pt workup last year unremarkable except mild gastroparesis     2. Fatty liver, elevated LFT's. MRCP again confirms fatty liver.   Continue antiemetics, PPI and Reglan. Change PO Zofran to ac and hs (not prn). Dr. Benson [place] to resume care Monday.  Active Problems:   Chronic cholecystitis   Nonspecific elevation of levels of transaminase or lactic acid dehydrogenase (LDH)   Hepatic steatosis   Nausea with vomiting    LOS: 3 days   Norberto Sorenson T. Fuller Plan MD [facility] 03/17/2013, 10:02 AM

## 2013-03-18 DIAGNOSIS — E43 Unspecified severe protein-calorie malnutrition: Secondary | ICD-10-CM | POA: Insufficient documentation

## 2013-03-18 LAB — CBC
HCT: 30.3 % — ABNORMAL LOW (ref 36.0–46.0)
Hemoglobin: 10.6 g/dL — ABNORMAL LOW (ref 12.0–15.0)
MCH: 34.9 pg — ABNORMAL HIGH (ref 26.0–34.0)
MCHC: 35 g/dL (ref 30.0–36.0)
MCV: 99.7 fL (ref 78.0–100.0)
PLATELETS: 185 10*3/uL (ref 150–400)
RBC: 3.04 MIL/uL — ABNORMAL LOW (ref 3.87–5.11)
RDW: 13.4 % (ref 11.5–15.5)
WBC: 8.4 10*3/uL (ref 4.0–10.5)

## 2013-03-18 LAB — HEPATIC FUNCTION PANEL
ALT: 31 U/L (ref 0–35)
AST: 100 U/L — ABNORMAL HIGH (ref 0–37)
Albumin: 2.6 g/dL — ABNORMAL LOW (ref 3.5–5.2)
Alkaline Phosphatase: 219 U/L — ABNORMAL HIGH (ref 39–117)
BILIRUBIN INDIRECT: 0.7 mg/dL (ref 0.3–0.9)
Bilirubin, Direct: 0.7 mg/dL — ABNORMAL HIGH (ref 0.0–0.3)
TOTAL PROTEIN: 5.8 g/dL — AB (ref 6.0–8.3)
Total Bilirubin: 1.4 mg/dL — ABNORMAL HIGH (ref 0.3–1.2)

## 2013-03-18 LAB — GLUCOSE, CAPILLARY
GLUCOSE-CAPILLARY: 108 mg/dL — AB (ref 70–99)
Glucose-Capillary: 123 mg/dL — ABNORMAL HIGH (ref 70–99)

## 2013-03-18 LAB — BASIC METABOLIC PANEL
BUN: <3 mg/dL — ABNORMAL LOW (ref 6–23)
CO2: 25 mEq/L (ref 19–32)
CREATININE: 0.3 mg/dL — AB (ref 0.50–1.10)
Calcium: 7.1 mg/dL — ABNORMAL LOW (ref 8.4–10.5)
Chloride: 93 mEq/L — ABNORMAL LOW (ref 96–112)
Glucose, Bld: 142 mg/dL — ABNORMAL HIGH (ref 70–99)
Potassium: 2.6 mEq/L — CL (ref 3.7–5.3)
Sodium: 136 mEq/L — ABNORMAL LOW (ref 137–147)

## 2013-03-18 LAB — MAGNESIUM: MAGNESIUM: 0.8 mg/dL — AB (ref 1.5–2.5)

## 2013-03-18 LAB — PHOSPHORUS: PHOSPHORUS: 2 mg/dL — AB (ref 2.3–4.6)

## 2013-03-18 MED ORDER — POTASSIUM PHOSPHATE DIBASIC 3 MMOLE/ML IV SOLN
17.0000 mmol | Freq: Once | INTRAVENOUS | Status: AC
  Administered 2013-03-18: 17 mmol via INTRAVENOUS
  Filled 2013-03-18: qty 5.67

## 2013-03-18 MED ORDER — TRACE MINERALS CR-CU-F-FE-I-MN-MO-SE-ZN IV SOLN
INTRAVENOUS | Status: AC
  Administered 2013-03-18: 18:00:00 via INTRAVENOUS
  Filled 2013-03-18: qty 1000

## 2013-03-18 MED ORDER — ADULT MULTIVITAMIN W/MINERALS CH
1.0000 | ORAL_TABLET | Freq: Every day | ORAL | Status: DC
  Administered 2013-03-19 – 2013-03-26 (×8): 1 via ORAL
  Filled 2013-03-18 (×9): qty 1

## 2013-03-18 MED ORDER — INSULIN ASPART 100 UNIT/ML ~~LOC~~ SOLN
0.0000 [IU] | SUBCUTANEOUS | Status: DC
  Administered 2013-03-19: 1 [IU] via SUBCUTANEOUS

## 2013-03-18 MED ORDER — LORAZEPAM 1 MG PO TABS
1.0000 mg | ORAL_TABLET | Freq: Four times a day (QID) | ORAL | Status: AC | PRN
Start: 2013-03-18 — End: 2013-03-21

## 2013-03-18 MED ORDER — MAGNESIUM SULFATE 40 MG/ML IJ SOLN
4.0000 g | Freq: Once | INTRAMUSCULAR | Status: AC
  Administered 2013-03-18: 4 g via INTRAVENOUS
  Filled 2013-03-18: qty 100

## 2013-03-18 MED ORDER — SODIUM CHLORIDE 0.9 % IJ SOLN
10.0000 mL | INTRAMUSCULAR | Status: DC | PRN
  Administered 2013-03-19 – 2013-03-25 (×3): 10 mL
  Administered 2013-03-26: 20 mL

## 2013-03-18 MED ORDER — FOLIC ACID 1 MG PO TABS
1.0000 mg | ORAL_TABLET | Freq: Every day | ORAL | Status: DC
  Administered 2013-03-19 – 2013-03-26 (×8): 1 mg via ORAL
  Filled 2013-03-18 (×9): qty 1

## 2013-03-18 MED ORDER — KCL IN DEXTROSE-NACL 20-5-0.9 MEQ/L-%-% IV SOLN
INTRAVENOUS | Status: AC
  Administered 2013-03-19: 07:00:00 via INTRAVENOUS
  Filled 2013-03-18 (×3): qty 1000

## 2013-03-18 MED ORDER — POTASSIUM CHLORIDE 10 MEQ/100ML IV SOLN
10.0000 meq | INTRAVENOUS | Status: AC
  Administered 2013-03-18 (×2): 10 meq via INTRAVENOUS
  Filled 2013-03-18 (×6): qty 100

## 2013-03-18 MED ORDER — LORAZEPAM 2 MG/ML IJ SOLN: 1.0000 mg | Freq: Four times a day (QID) | INTRAMUSCULAR | Status: AC | PRN

## 2013-03-18 MED ORDER — FAT EMULSION 20 % IV EMUL
240.0000 mL | INTRAVENOUS | Status: AC
  Administered 2013-03-18: 240 mL via INTRAVENOUS
  Filled 2013-03-18: qty 250

## 2013-03-18 MED ORDER — VITAMIN B-1 100 MG PO TABS
100.0000 mg | ORAL_TABLET | Freq: Every day | ORAL | Status: DC
  Administered 2013-03-19 – 2013-03-26 (×8): 100 mg via ORAL
  Filled 2013-03-18 (×9): qty 1

## 2013-03-18 MED ORDER — POTASSIUM CHLORIDE 10 MEQ/100ML IV SOLN
10.0000 meq | INTRAVENOUS | Status: AC
  Administered 2013-03-18 (×4): 10 meq via INTRAVENOUS
  Filled 2013-03-18 (×4): qty 100

## 2013-03-18 MED ORDER — THIAMINE HCL 100 MG/ML IJ SOLN
100.0000 mg | Freq: Every day | INTRAMUSCULAR | Status: DC
  Administered 2013-03-18: 100 mg via INTRAVENOUS
  Filled 2013-03-18 (×9): qty 1

## 2013-03-18 NOTE — Care Management Note (Signed)
CARE MANAGEMENT NOTE 03/18/2013  Patient:  Terri Ayala, Terri Ayala   Account Number:  [id number]  Date Initiated:  03/18/2013  Documentation initiated by:  [facility]  Subjective/Objective Assessment:   admitted s/p lap chole     Action/Plan:   plan home with PICC and TPN   Anticipated DC Date:  03/21/2013   Anticipated DC Plan:  [place]  CM consult      Choice offered to / List presented to:             Status of service:  In process, will continue to follow

## 2013-03-18 NOTE — Progress Notes (Signed)
Subjective: Since I last evaluated the patient, she has not had any improvement in her symptoms. The prokinetics have never helped her much either. She has had one BM since her cholecystectomy. Events since admission noted. MRCP revealed severe hepatic steatosis but there were biliary abnormalities noted.   Objective: Vital signs in last 24 hours: Temp:  [97.9 F (36.6 C)-98.6 F (37 C)] 97.9 F (36.6 C) (01/19 0541) Pulse Rate:  [95-98] 98 (01/19 0541) Resp:  [18] 18 (01/19 0541) BP: (127-128)/(82-87) 128/82 mmHg (01/19 0541) SpO2:  [98 %-100 %] 98 % (01/19 0541) Last BM Date: 03/16/13  Intake/Output from previous day: 01/18 0701 - 01/19 0700 In: 4960.4 [P.O.:600; I.V.:4360.4] Out: -  Intake/Output this shift: Total I/O In: 1642.9 [P.O.:120; I.V.:1522.9] Out: -   General appearance: alert, cooperative, appears stated age, fatigued and mild distress; she is HOH  Resp: clear to auscultation bilaterally Cardio: regular rate and rhythm, S1, S2 normal, no murmur, click, rub or gallop GI: soft, slightly diffuse tenderness on palpation with no bowel sounds; no masses,  no organomegaly Extremities: extremities normal, atraumatic, no cyanosis or edema  Lab Results: No results found for this basename: WBC, HGB, HCT, PLT,  in the last 72 hours BMET  Recent Labs  03/16/13 0416  NA 141  K 3.0*  CL 100  CO2 23  GLUCOSE 167*  BUN <3*  CREATININE 0.35*  CALCIUM 7.7*   LFT  Recent Labs  03/16/13 0416  PROT 5.9*  ALBUMIN 2.8*  AST 193*  ALT 41*  ALKPHOS 246*  BILITOT 2.3*   Studies/Results: Mr 3d Recon At Scanner  03/16/2013   CLINICAL DATA:  Abdominal pain. Nausea and vomiting. Status post cholecystectomy.  EXAM: MR 3D RECON AT SCANNER; MR ABDOMEN WO/W CM MRCP  TECHNIQUE: Multiplanar multisequence MR imaging of the abdomen was performed, including heavily T2-weighted images of the biliary and pancreatic ducts. Three-dimensional MR images were rendered by post processing of  the original MR data.  CONTRAST:  8mL MULTIHANCE GADOBENATE DIMEGLUMINE 529 MG/ML IV SOLN  COMPARISON:  CT of the abdomen and pelvis 01/29/2013. Abdominal ultrasound 02/01/2013.  FINDINGS: MRCP images are suboptimal related to large amount of patient respiratory motion. Despite these limitations, there is no evidence of a intra or extrahepatic biliary ductal dilatation. No pancreatic ductal dilatation. There are no definite filling defects within the common bile duct on any of the pulse sequences of today's examination to suggest presence of the retained ductal stone. No focal hepatic lesions are identified. Profound loss of signal intensity throughout the liver on out of phase imaging, compatible with severe hepatic steatosis.  The appearance of the visualized pancreas, spleen, bilateral adrenal glands and the right kidney is unremarkable. 1.2 cm lesion in the anterior aspect of the interpolar region of the left kidney is low signal intensity on T1 weighted images, high signal intensity on T2 weighted images, and does not enhance, compatible with a small simple cyst. Trace amount of ascites around both the liver and the spleen. Trace bilateral pleural effusions (right greater than left).  IMPRESSION: 1. Despite the limitations of today's MRCP images, there is no intra or extrahepatic biliary ductal dilatation, and no evidence of retained ductal stone. 2. Severe hepatic steatosis redemonstrated. 3. Trace volume of perihepatic and perisplenic ascites. 4. Trace bilateral pleural effusions (right greater than left).   Electronically Signed   By: Vinnie Langton M.D.   On: 03/16/2013 18:01   Mr Abd W/wo Cm/mrcp  03/16/2013   CLINICAL DATA:  Abdominal pain. Nausea and vomiting. Status post cholecystectomy.  EXAM: MR 3D RECON AT SCANNER; MR ABDOMEN WO/W CM MRCP  TECHNIQUE: Multiplanar multisequence MR imaging of the abdomen was performed, including heavily T2-weighted images of the biliary and pancreatic ducts.  Three-dimensional MR images were rendered by post processing of the original MR data.  CONTRAST:  13mL MULTIHANCE GADOBENATE DIMEGLUMINE 529 MG/ML IV SOLN  COMPARISON:  CT of the abdomen and pelvis 01/29/2013. Abdominal ultrasound 02/01/2013.  FINDINGS: MRCP images are suboptimal related to large amount of patient respiratory motion. Despite these limitations, there is no evidence of a intra or extrahepatic biliary ductal dilatation. No pancreatic ductal dilatation. There are no definite filling defects within the common bile duct on any of the pulse sequences of today's examination to suggest presence of the retained ductal stone. No focal hepatic lesions are identified. Profound loss of signal intensity throughout the liver on out of phase imaging, compatible with severe hepatic steatosis.  The appearance of the visualized pancreas, spleen, bilateral adrenal glands and the right kidney is unremarkable. 1.2 cm lesion in the anterior aspect of the interpolar region of the left kidney is low signal intensity on T1 weighted images, high signal intensity on T2 weighted images, and does not enhance, compatible with a small simple cyst. Trace amount of ascites around both the liver and the spleen. Trace bilateral pleural effusions (right greater than left).  IMPRESSION: 1. Despite the limitations of today's MRCP images, there is no intra or extrahepatic biliary ductal dilatation, and no evidence of retained ductal stone. 2. Severe hepatic steatosis redemonstrated. 3. Trace volume of perihepatic and perisplenic ascites. 4. Trace bilateral pleural effusions (right greater than left).   Electronically Signed   By: Vinnie Langton M.D.   On: 03/16/2013 18:01   Medications: I have reviewed the patient's current medications.  Assessment/Plan: 1) Severe nausea: s/p laparoscopic cholecystectomy; elevated LFT's etiology unclear; has severe hepatic steatosis. I will try to get her an appointment with Dr. Scherrie November at  [facility] for her problems with nausea and vomiting. 2) Hyperglycemia:check Hemoglobin fasting AIc.  3) Malnutrition.   LOS: 4 days   Terri Ayala 03/18/2013, 6:36 AM

## 2013-03-18 NOTE — Progress Notes (Signed)
4 Days Post-Op  Subjective: Remains weak and nauseated  Objective: Vital signs in last 24 hours: Temp:  [97.9 F (36.6 C)-98.6 F (37 C)] 97.9 F (36.6 C) (01/19 0541) Pulse Rate:  [95-98] 98 (01/19 0541) Resp:  [18] 18 (01/19 0541) BP: (127-128)/(82-87) 128/82 mmHg (01/19 0541) SpO2:  [98 %-100 %] 98 % (01/19 0541) Last BM Date: 03/16/13  Intake/Output from previous day: 01/18 0701 - 01/19 0700 In: 4960.4 [P.O.:600; I.V.:4360.4] Out: -  Intake/Output this shift:    Abdomen soft, non tender  Lab Results:   Recent Labs  03/18/13 0730  WBC 8.4  HGB 10.6*  HCT 30.3*  PLT 185   BMET  Recent Labs  03/16/13 0416  NA 141  K 3.0*  CL 100  CO2 23  GLUCOSE 167*  BUN <3*  CREATININE 0.35*  CALCIUM 7.7*   PT/INR No results found for this basename: LABPROT, INR,  in the last 72 hours ABG No results found for this basename: PHART, PCO2, PO2, HCO3,  in the last 72 hours  Studies/Results: Mr 3d Recon At Scanner  03/16/2013   CLINICAL DATA:  Abdominal pain. Nausea and vomiting. Status post cholecystectomy.  EXAM: MR 3D RECON AT SCANNER; MR ABDOMEN WO/W CM MRCP  TECHNIQUE: Multiplanar multisequence MR imaging of the abdomen was performed, including heavily T2-weighted images of the biliary and pancreatic ducts. Three-dimensional MR images were rendered by post processing of the original MR data.  CONTRAST:  75mL MULTIHANCE GADOBENATE DIMEGLUMINE 529 MG/ML IV SOLN  COMPARISON:  CT of the abdomen and pelvis 01/29/2013. Abdominal ultrasound 02/01/2013.  FINDINGS: MRCP images are suboptimal related to large amount of patient respiratory motion. Despite these limitations, there is no evidence of a intra or extrahepatic biliary ductal dilatation. No pancreatic ductal dilatation. There are no definite filling defects within the common bile duct on any of the pulse sequences of today's examination to suggest presence of the retained ductal stone. No focal hepatic lesions are identified.  Profound loss of signal intensity throughout the liver on out of phase imaging, compatible with severe hepatic steatosis.  The appearance of the visualized pancreas, spleen, bilateral adrenal glands and the right kidney is unremarkable. 1.2 cm lesion in the anterior aspect of the interpolar region of the left kidney is low signal intensity on T1 weighted images, high signal intensity on T2 weighted images, and does not enhance, compatible with a small simple cyst. Trace amount of ascites around both the liver and the spleen. Trace bilateral pleural effusions (right greater than left).  IMPRESSION: 1. Despite the limitations of today's MRCP images, there is no intra or extrahepatic biliary ductal dilatation, and no evidence of retained ductal stone. 2. Severe hepatic steatosis redemonstrated. 3. Trace volume of perihepatic and perisplenic ascites. 4. Trace bilateral pleural effusions (right greater than left).   Electronically Signed   By: Vinnie Langton M.D.   On: 03/16/2013 18:01   Mr Abd W/wo Cm/mrcp  03/16/2013   CLINICAL DATA:  Abdominal pain. Nausea and vomiting. Status post cholecystectomy.  EXAM: MR 3D RECON AT SCANNER; MR ABDOMEN WO/W CM MRCP  TECHNIQUE: Multiplanar multisequence MR imaging of the abdomen was performed, including heavily T2-weighted images of the biliary and pancreatic ducts. Three-dimensional MR images were rendered by post processing of the original MR data.  CONTRAST:  49mL MULTIHANCE GADOBENATE DIMEGLUMINE 529 MG/ML IV SOLN  COMPARISON:  CT of the abdomen and pelvis 01/29/2013. Abdominal ultrasound 02/01/2013.  FINDINGS: MRCP images are suboptimal related to large amount of  patient respiratory motion. Despite these limitations, there is no evidence of a intra or extrahepatic biliary ductal dilatation. No pancreatic ductal dilatation. There are no definite filling defects within the common bile duct on any of the pulse sequences of today's examination to suggest presence of the  retained ductal stone. No focal hepatic lesions are identified. Profound loss of signal intensity throughout the liver on out of phase imaging, compatible with severe hepatic steatosis.  The appearance of the visualized pancreas, spleen, bilateral adrenal glands and the right kidney is unremarkable. 1.2 cm lesion in the anterior aspect of the interpolar region of the left kidney is low signal intensity on T1 weighted images, high signal intensity on T2 weighted images, and does not enhance, compatible with a small simple cyst. Trace amount of ascites around both the liver and the spleen. Trace bilateral pleural effusions (right greater than left).  IMPRESSION: 1. Despite the limitations of today's MRCP images, there is no intra or extrahepatic biliary ductal dilatation, and no evidence of retained ductal stone. 2. Severe hepatic steatosis redemonstrated. 3. Trace volume of perihepatic and perisplenic ascites. 4. Trace bilateral pleural effusions (right greater than left).   Electronically Signed   By: Vinnie Langton M.D.   On: 03/16/2013 18:01    Anti-infectives: Anti-infectives   Start     Dose/Rate Route Frequency Ordered Stop   03/14/13 0600  ceFAZolin (ANCEF) IVPB 2 g/50 mL premix     2 g 100 mL/hr over 30 Minutes Intravenous On call to O.R. 03/13/13 1359 03/14/13 0727      Assessment/Plan: s/p Procedure(s): LAPAROSCOPIC CHOLECYSTECTOMY WITH ATTEMPTED INTRAOPERATIVE CHOLANGIOGRAM (N/A)  Nausea and fatty liver of uncertain etiology She feels too weak to manage as an outpt.   Will get nutrition consult, have PICC line placed and start TPN  LOS: 4 days    [facility] A 03/18/2013

## 2013-03-18 NOTE — Progress Notes (Signed)
INITIAL NUTRITION ASSESSMENT  DOCUMENTATION CODES Per approved criteria  -Severe malnutrition in the context of chronic illness   INTERVENTION: TPN management per Pharmacy  Recommend monitor magnesium, potassium, and phosphorus daily and replete as needed, as pt is at risk for refeeding syndrome given severe malnutrition.    NUTRITION DIAGNOSIS: Malnutrition related to chronic nausea as evidenced by po intake </= 75% of her needs for >/= 1 month and severe fat and muscle wasting.   Goal: Pt to meet >/= 90% of their estimated nutrition needs   Monitor:  GI function, labs, weight trend  Reason for Assessment: MD Consult  58 y.o. female  Admitting Dx: <principal problem not specified>  ASSESSMENT: Pt admitted with abdominal pain, nausea, and vomiting. Pt admitted for lap chole which she had 1/15 but has remained very nauseated. Per MD note pt with severe nausea with elevated LFT's of unknown etiology, severe hepatic steatosis. Pt has been unable to tolerate any PO's and is starting TPN today.  Per pt she has had nausea for about 1 year but it has gotten progressively worse over the last 6 months and it is when it started to effect her intake. Pt reports no appetite and has only been eating some soup once a day for at least the last month. Pt reports her usual weight as 115-120 lb, however per record review it appears that pt may have been about 130 lb. Pt states she has not weighed herself.  Per pt her mom is visiting to help her.  PICC not yet placed to start TPN.  Per Pharmacy orders pt to start Clinimix E 5/15 @ 40 ml/hr with 20% lipids at 10 ml/hr. This will provide: 1161 kcal and 48 grams protein. Refeeding labs ordered.  Potassium low and being supplemented IV.   Nutrition Focused Physical Exam:  Subcutaneous Fat:  Orbital Region: WNL Upper Arm Region: WNL Thoracic and Lumbar Region: severe wasting  Muscle:  Temple Region: mild-moderate wasting Clavicle Bone Region:  severe malnutrition Clavicle and Acromion Bone Region: severe malnutrition Scapular Bone Region: NA Dorsal Hand: severe malnutrition Patellar Region: severe malnutrition Anterior Thigh Region: severe malnutrition Posterior Calf Region: severe malnutrition  Edema: not present   Height: Ht Readings from Last 1 Encounters:  03/14/13 5\' 2"  (1.575 m)    Weight: Wt Readings from Last 1 Encounters:  03/14/13 124 lb (56.246 kg)    Ideal Body Weight: 50 kg  % Ideal Body Weight: 112%  Wt Readings from Last 10 Encounters:  03/14/13 124 lb (56.246 kg)  03/14/13 124 lb (56.246 kg)  03/08/13 124 lb 6.4 oz (56.427 kg)  02/13/13 123 lb (55.792 kg)  10/31/12 128 lb (58.06 kg)  10/26/12 130 lb (58.968 kg)  10/11/12 126 lb (57.153 kg)  10/04/12 125 lb (56.7 kg)  03/01/12 127 lb (57.607 kg)  01/10/12 123 lb (55.792 kg)    Usual Body Weight: 115-120 lb   % Usual Body Weight: within range  BMI:  Body mass index is 22.67 kg/(m^2).  Estimated Nutritional Needs: Kcal: 1550-1700 Protein: 75-85 grams Fluid: > 1.6 L/day  Skin: abd incision   Diet Order: Clear Liquid  EDUCATION NEEDS: -No education needs identified at this time   Intake/Output Summary (Last 24 hours) at 03/18/13 1156 Last data filed at 03/18/13 0542  Gross per 24 hour  Intake 4960.42 ml  Output      0 ml  Net 4960.42 ml    Last BM: 1/17  Labs:   Recent Labs Lab 03/15/13  0403 03/16/13 0416 03/18/13 0730  NA 142 141 136*  K 3.6* 3.0* 2.6*  CL 104 100 93*  CO2 22 23 25   BUN 3* <3* <3*  CREATININE 0.49* 0.35* 0.30*  CALCIUM 8.2* 7.7* 7.1*  GLUCOSE 134* 167* 142*    CBG (last 3)   Recent Labs  03/16/13 0421 03/17/13 1119  GLUCAP 171* 148*    Scheduled Meds: . diltiazem  240 mg Oral Daily  . enoxaparin (LOVENOX) injection  40 mg Subcutaneous Q24H  . insulin aspart  0-9 Units Subcutaneous Q4H  . metoCLOPramide (REGLAN) injection  10 mg Intravenous Q6H  . ondansetron  4 mg Oral TID AC &  HS   Or  . ondansetron (ZOFRAN) IV  4 mg Intravenous TID AC & HS  . pantoprazole  40 mg Oral Daily  . potassium chloride  10 mEq Intravenous Q1 Hr x 6    Continuous Infusions: . dextrose 5 % and 0.9 % NaCl with KCl 20 mEq/L 125 mL/hr at 03/18/13 0440  . dextrose 5 % and 0.9 % NaCl with KCl 20 mEq/L    . [name] KitchenTPN (CLINIMIX-E) Adult     And  . fat emulsion      Past Medical History  Diagnosis Date  . Low back pain   . Anxiety   . Migraines   . GOITER, MULTINODULAR 08/17/2009  . OSTEOPOROSIS 08/17/2009  . ASYMPTOMATIC POSTMENOPAUSAL STATUS 08/17/2009  . DYSPHAGIA 06/19/2007  . Supraventricular tachycardia   . Hyperlipidemia   . Mitral valve prolapse   . PONV (postoperative nausea and vomiting)     Past Surgical History  Procedure Laterality Date  . Laporoscopic abdominal surgery    . Cervix surgery    . Cholecystectomy N/A 03/14/2013    Procedure: LAPAROSCOPIC CHOLECYSTECTOMY WITH ATTEMPTED INTRAOPERATIVE CHOLANGIOGRAM;  Surgeon: Harl Bowie, MD;  Location: [facility];  Service: General;  Laterality: N/A;    Maylon Peppers RD, [place], [place] Pager [phone] After Hours Pager

## 2013-03-18 NOTE — Progress Notes (Signed)
PARENTERAL NUTRITION CONSULT NOTE - INITIAL  Pharmacy Consult for TPN Indication: intolerance to enteral feeding  Allergies  Allergen Reactions  . Doxycycline Other (See Comments)    Reaction unknown  . Tetracycline Hcl Other (See Comments)    Reaction unknown    Patient Measurements: Height: _0  (157.5 cm) Weight: 124 lb (56.246 kg) IBW/kg (Calculated) : 50.1  Vital Signs: Temp: 97.9 F (36.6 C) (01/19 0541) Temp src: Oral (01/19 0541) BP: 128/82 mmHg (01/19 0541) Pulse Rate: 98 (01/19 0541) Intake/Output from previous day: 01/18 0701 - 01/19 0700 In: 4960.4 [P.O.:600; I.V.:4360.4] Out: -  Intake/Output from this shift:    Labs:  Recent Labs  03/18/13 0730  WBC 8.4  HGB 10.6*  HCT 30.3*  PLT 185     Recent Labs  03/16/13 0416 03/18/13 0730  NA 141 136*  K 3.0* 2.6*  CL 100 93*  CO2 23 25  GLUCOSE 167* 142*  BUN <3* <3*  CREATININE 0.35* 0.30*  CALCIUM 7.7* 7.1*  PROT 5.9* 5.8*  ALBUMIN 2.8* 2.6*  AST 193* 100*  ALT 41* 31  ALKPHOS 246* 219*  BILITOT 2.3* 1.4*  BILIDIR  --  0.7*  IBILI  --  0.7   Estimated Creatinine Clearance: 60.6 ml/min (by C-G formula based on Cr of 0.3).    Recent Labs  03/16/13 0421 03/17/13 1119  GLUCAP 171* 148*    Medical History: Past Medical History  Diagnosis Date  . Low back pain   . Anxiety   . Migraines   . GOITER, MULTINODULAR 08/17/2009  . OSTEOPOROSIS 08/17/2009  . ASYMPTOMATIC POSTMENOPAUSAL STATUS 08/17/2009  . DYSPHAGIA 06/19/2007  . Supraventricular tachycardia   . Hyperlipidemia   . Mitral valve prolapse   . PONV (postoperative nausea and vomiting)     GI: s/p cholecystectomy 1/15,  She remains nauseated and weak, MRCP revealed severe hepatic steatosis with biliary abnormalities, on reglan IV qid, last BM 1/17 Lytes: Na 136, K 2.6, Cl 93, Ca 7.1 CoCa 8.22 Renal: SrCr 0.3  Urinated 6 times Cards: ASA, Cardizem,  VSS Hepatobil: Alk phos 219, AST/ALT 100/31, tbili 1.4 alb 2.6 Neuro: She  is HOH ID:   Afeb, WBC 8.4  No abx TPN Access: PICC line ordered 1/19 TPN day#:1  Insulin Requirements in the past 24 hours:  Will add ssi with initiation of TPN  Current Nutrition:  Clear liquids  Assessment: 58 yo lady to start TPN as she does not tolerate enteral feeding per MD.  She has had severe nausea s/p lap chole, elevated LFTs etiology unclear and severe hepatic steatosis. She had no improvement in symptoms with reglan.  Nutritional Goals:  Estimate 1400-1650 kCal, 70-80 grams of protein per day  Plan:  Will give KCl 10 mEq IV X 6. Add magn and phos to am labs and supplement as needed Start Clinimix E 5/15 at 40 ml/hr Lipids 20% at 63m/hr F/u am TPN labs Add ssi q4 hours when TPN starts F/u RD recommendations  Emer Onnen Poteet 03/18/2013,9:26 AM

## 2013-03-18 NOTE — Progress Notes (Signed)
Peripherally Inserted Central Catheter/Midline Placement  The IV Nurse has discussed with the patient and/or persons authorized to consent for the patient, the purpose of this procedure and the potential benefits and risks involved with this procedure.  The benefits include less needle sticks, lab draws from the catheter and patient may be discharged home with the catheter.  Risks include, but not limited to, infection, bleeding, blood clot (thrombus formation), and puncture of an artery; nerve damage and irregular heat beat.  Alternatives to this procedure were also discussed.  PICC/Midline Placement Documentation  PICC / Midline Double Lumen 56/97/94 PICC Right Basilic 37 cm 0 cm (Active)       Murvin Natal 03/18/2013, 4:38 PM

## 2013-03-18 NOTE — Progress Notes (Signed)
Patient ID: Terri Ayala, female   DOB: 1956/01/14, 58 y.o.   MRN: [id number]  Patient apparently an alcoholic according to her mother reporting this to case worker.  Will place on withdrawal protocol.

## 2013-03-19 LAB — TRIGLYCERIDES: TRIGLYCERIDES: 169 mg/dL — AB (ref <150)

## 2013-03-19 LAB — COMPREHENSIVE METABOLIC PANEL
ALK PHOS: 196 U/L — AB (ref 39–117)
ALT: 22 U/L (ref 0–35)
AST: 66 U/L — AB (ref 0–37)
Albumin: 2.2 g/dL — ABNORMAL LOW (ref 3.5–5.2)
BUN: 5 mg/dL — ABNORMAL LOW (ref 6–23)
CALCIUM: 7.1 mg/dL — AB (ref 8.4–10.5)
CO2: 28 meq/L (ref 19–32)
Chloride: 101 mEq/L (ref 96–112)
Creatinine, Ser: 0.42 mg/dL — ABNORMAL LOW (ref 0.50–1.10)
GFR calc Af Amer: >90 mL/min (ref >90)
GFR calc non Af Amer: >90 mL/min (ref >90)
GLUCOSE: 105 mg/dL — AB (ref 70–99)
Potassium: 3.1 mEq/L — ABNORMAL LOW (ref 3.7–5.3)
SODIUM: 141 meq/L (ref 137–147)
TOTAL PROTEIN: 5 g/dL — AB (ref 6.0–8.3)
Total Bilirubin: 1 mg/dL (ref 0.3–1.2)

## 2013-03-19 LAB — DIFFERENTIAL
Basophils Absolute: 0 10*3/uL (ref 0.0–0.1)
Basophils Relative: 0 % (ref 0–1)
Eosinophils Absolute: 0 10*3/uL (ref 0.0–0.7)
Eosinophils Relative: 0 % (ref 0–5)
LYMPHS ABS: 1 10*3/uL (ref 0.7–4.0)
LYMPHS PCT: 11 % — AB (ref 12–46)
MONO ABS: 0.8 10*3/uL (ref 0.1–1.0)
MONOS PCT: 8 % (ref 3–12)
NEUTROS PCT: 81 % — AB (ref 43–77)
Neutro Abs: 7.5 10*3/uL (ref 1.7–7.7)

## 2013-03-19 LAB — CBC
HEMATOCRIT: 27.4 % — AB (ref 36.0–46.0)
HEMOGLOBIN: 9.3 g/dL — AB (ref 12.0–15.0)
MCH: 34.3 pg — ABNORMAL HIGH (ref 26.0–34.0)
MCHC: 33.9 g/dL (ref 30.0–36.0)
MCV: 101.1 fL — ABNORMAL HIGH (ref 78.0–100.0)
Platelets: 197 10*3/uL (ref 150–400)
RBC: 2.71 MIL/uL — AB (ref 3.87–5.11)
RDW: 13.8 % (ref 11.5–15.5)
WBC: 9.3 10*3/uL (ref 4.0–10.5)

## 2013-03-19 LAB — MAGNESIUM: Magnesium: 2.2 mg/dL (ref 1.5–2.5)

## 2013-03-19 LAB — PREALBUMIN: Prealbumin: 12.1 mg/dL — ABNORMAL LOW (ref 17.0–34.0)

## 2013-03-19 LAB — GLUCOSE, CAPILLARY
GLUCOSE-CAPILLARY: 104 mg/dL — AB (ref 70–99)
Glucose-Capillary: 110 mg/dL — ABNORMAL HIGH (ref 70–99)
Glucose-Capillary: 120 mg/dL — ABNORMAL HIGH (ref 70–99)
Glucose-Capillary: 110 mg/dL — ABNORMAL HIGH (ref 70–99)
Glucose-Capillary: 100 mg/dL — ABNORMAL HIGH (ref 70–99)

## 2013-03-19 LAB — PHOSPHORUS: PHOSPHORUS: 4.1 mg/dL (ref 2.3–4.6)

## 2013-03-19 LAB — HEMOGLOBIN A1C
Hgb A1c MFr Bld: 5 % (ref <5.7)
MEAN PLASMA GLUCOSE: 97 mg/dL (ref <117)

## 2013-03-19 MED ORDER — POTASSIUM CHLORIDE 10 MEQ/50ML IV SOLN
10.0000 meq | INTRAVENOUS | Status: AC
  Administered 2013-03-19 (×3): 10 meq via INTRAVENOUS
  Filled 2013-03-19 (×4): qty 50

## 2013-03-19 MED ORDER — SODIUM CHLORIDE 0.9 % IV BOLUS (SEPSIS)
1000.0000 mL | Freq: Once | INTRAVENOUS | Status: AC
  Administered 2013-03-19: 1000 mL via INTRAVENOUS

## 2013-03-19 MED ORDER — SODIUM CHLORIDE 0.9 % IV SOLN
INTRAVENOUS | Status: AC
  Administered 2013-03-20: 01:00:00 via INTRAVENOUS

## 2013-03-19 MED ORDER — POTASSIUM CHLORIDE 10 MEQ/100ML IV SOLN
10.0000 meq | INTRAVENOUS | Status: DC
  Administered 2013-03-19: 10 meq via INTRAVENOUS
  Filled 2013-03-19 (×3): qty 100

## 2013-03-19 MED ORDER — FAT EMULSION 20 % IV EMUL
240.0000 mL | INTRAVENOUS | Status: AC
  Administered 2013-03-19: 240 mL via INTRAVENOUS
  Filled 2013-03-19: qty 250

## 2013-03-19 MED ORDER — TRAMADOL HCL 50 MG PO TABS
50.0000 mg | ORAL_TABLET | Freq: Four times a day (QID) | ORAL | Status: DC | PRN
  Administered 2013-03-19 – 2013-03-24 (×9): 50 mg via ORAL
  Filled 2013-03-19 (×9): qty 1

## 2013-03-19 MED ORDER — CLINIMIX E/DEXTROSE (5/15) 5 % IV SOLN
INTRAVENOUS | Status: AC
  Administered 2013-03-19: 17:00:00 via INTRAVENOUS
  Filled 2013-03-19: qty 2000

## 2013-03-19 NOTE — Progress Notes (Signed)
Dr Ninfa Linden notified patient BP 85/60 - asymptomatic lying in bed. Bolus order received.

## 2013-03-19 NOTE — Progress Notes (Signed)
PARENTERAL NUTRITION CONSULT NOTE - follow up Pharmacy Consult for TPN Indication: intolerance to enteral feeding  Allergies  Allergen Reactions  . Doxycycline Other (See Comments)    Reaction unknown  . Tetracycline Hcl Other (See Comments)    Reaction unknown    Patient Measurements: Height: 5' 2"  (157.5 cm) Weight: 124 lb (56.246 kg) IBW/kg (Calculated) : 50.1  Vital Signs: Temp: 98.1 F (36.7 C) (01/20 0458) BP: 100/60 mmHg (01/20 0458) Pulse Rate: 88 (01/20 0458) Intake/Output from previous day: 01/19 0701 - 01/20 0700 In: 1050 [P.O.:30; I.V.:1020] Out: -  Intake/Output from this shift:    Labs:  Recent Labs  03/18/13 0730 03/19/13 0515  WBC 8.4 9.3  HGB 10.6* 9.3*  HCT 30.3* 27.4*  PLT 185 197     Recent Labs  03/18/13 0730 03/19/13 0511 03/19/13 0515  NA 136*  --  141  K 2.6*  --  3.1*  CL 93*  --  101  CO2 25  --  28  GLUCOSE 142*  --  105*  BUN <3*  --  5*  CREATININE 0.30*  --  0.42*  CALCIUM 7.1*  --  7.1*  MG 0.8*  --  2.2  PHOS 2.0*  --  4.1  PROT 5.8*  --  5.0*  ALBUMIN 2.6*  --  2.2*  AST 100*  --  66*  ALT 31  --  22  ALKPHOS 219*  --  196*  BILITOT 1.4*  --  1.0  BILIDIR 0.7*  --   --   IBILI 0.7  --   --   TRIG  --  169*  --    Estimated Creatinine Clearance: 60.6 ml/min (by C-G formula based on Cr of 0.42).    Recent Labs  03/18/13 2020 03/18/13 2357 03/19/13 0400  GLUCAP 108* 123* 110*    GI: s/p cholecystectomy 1/15,  She remains nauseated and weak, MRCP revealed severe hepatic steatosis with biliary abnormalities, on reglan IV qid, last BM 1/18; on clear liq diet; malnutrition related to chronic nausea Lytes: Na 141, K up to 3.1 from 2.6 after 6 runs K and 17 mM kphos, mag up to 2.2 from 0.8 after 4 gm mag, Ca 7.1 CoCa 8.54; phos up to 4.1 from 2.0 after 17 mM kphos Renal: SrCr 0.42  Urinated 4 times Hepatobil: Alk phos 196, AST/ALT 66/22, tbili 1 alb 2.2 TPN Access: PICC line placed 1/19 TPN day#:2  Insulin  Requirements in the past 24 hours:  Will add ssi with initiation of TPN, CBGs 103, 123 and 110 after TPN started, all < 150 as desired  Current Nutrition:  Clear liquids  Assessment: 58 yo lady to start TPN as she does not tolerate enteral feeding per MD.  She has had severe nausea s/p lap chole, elevated LFTs etiology unclear and severe hepatic steatosis. She had no improvement in symptoms with reglan. First day of TPN well tolerated.  Pt placed on alcohol withdrawal protocol with MVI, FA, and thiamine.  She is taking PO PPI and PO vicodin.  IVF with D5NS 20 k at 85 ml/hr.   Plan is to DC home with PICC and TPN.   Nutritional Goals: per RD 03/18/13 1550-1700 kCal, 75-85 grams of protein per day  Plan:  1. Increase Clinimix E 5/15 to 70 ml/hr + lipids at 10 ml/hr to provide 84 gm protein and 1637 kcals.  This will provide 100% support.  Change IVF to NS at kvo once TPN at goal rate.  2. Supplement K with 4 runs 3. Keep SSI until TPN at goal rate and then DC it if CBGs < 150 4. PO MVI, FA, thiamine, PPI 5. Transition to cyclic TPN rate once at goal rate for transition to home [place], Pharm.D. [phone] 03/19/2013 7:59 AM

## 2013-03-19 NOTE — Progress Notes (Signed)
5 Days Post-Op  Subjective: Still with nausea, but less  Objective: Vital signs in last 24 hours: Temp:  [97.6 F (36.4 C)-98.1 F (36.7 C)] 98.1 F (36.7 C) (01/20 0458) Pulse Rate:  [86-100] 88 (01/20 0458) Resp:  [18] 18 (01/20 0458) BP: (100-120)/(60-72) 100/60 mmHg (01/20 0458) SpO2:  [98 %-100 %] 100 % (01/20 0458) Last BM Date: 03/16/13  Intake/Output from previous day: 01/19 0701 - 01/20 0700 In: 1050 [P.O.:30; I.V.:1020] Out: -  Intake/Output this shift:    Looks better today Abdomen fairly benign of exam  Lab Results:   Recent Labs  03/18/13 0730 03/19/13 0515  WBC 8.4 9.3  HGB 10.6* 9.3*  HCT 30.3* 27.4*  PLT 185 197   BMET  Recent Labs  03/18/13 0730 03/19/13 0515  NA 136* 141  K 2.6* 3.1*  CL 93* 101  CO2 25 28  GLUCOSE 142* 105*  BUN <3* 5*  CREATININE 0.30* 0.42*  CALCIUM 7.1* 7.1*   PT/INR No results found for this basename: LABPROT, INR,  in the last 72 hours ABG No results found for this basename: PHART, PCO2, PO2, HCO3,  in the last 72 hours  Studies/Results: No results found.  Anti-infectives: Anti-infectives   Start     Dose/Rate Route Frequency Ordered Stop   03/14/13 0600  ceFAZolin (ANCEF) IVPB 2 g/50 mL premix     2 g 100 mL/hr over 30 Minutes Intravenous On call to O.R. 03/13/13 1359 03/14/13 0727      Assessment/Plan: s/p Procedure(s): LAPAROSCOPIC CHOLECYSTECTOMY WITH ATTEMPTED INTRAOPERATIVE CHOLANGIOGRAM (N/A)  Nausea and fatty liver of uncertain etiology.  The patient's mother suggested to case worker that the patient is an alcoholic.  The patient reports to me that she only has alcohol 3 or 4 times a week with diner.  I will keep her on the ETOH withdrawal protocol Continue TNA and nausea meds PT consult Correct electolytes  Hopefully home before the week is out  LOS: 5 days    Socrates Cahoon A 03/19/2013

## 2013-03-19 NOTE — Progress Notes (Signed)
Subjective: No acute events.  Nauseous, but she thinks that it is getting better.  Objective: Vital signs in last 24 hours: Temp:  [97.9 F (36.6 C)-98.4 F (36.9 C)] 98.4 F (36.9 C) (01/20 1500) Pulse Rate:  [86-98] 86 (01/20 1500) Resp:  [18] 18 (01/20 1500) BP: (86-110)/(50-67) 86/50 mmHg (01/20 1500) SpO2:  [98 %-100 %] 99 % (01/20 1500) Last BM Date: 03/16/13  Intake/Output from previous day: 01/19 0701 - 01/20 0700 In: 1050 [P.O.:30; I.V.:1020] Out: -  Intake/Output this shift:    General appearance: alert and fatigued GI: soft, non-tender; bowel sounds normal; no masses,  no organomegaly  Lab Results:  Recent Labs  03/18/13 0730 03/19/13 0515  WBC 8.4 9.3  HGB 10.6* 9.3*  HCT 30.3* 27.4*  PLT 185 197   BMET  Recent Labs  03/18/13 0730 03/19/13 0515  NA 136* 141  K 2.6* 3.1*  CL 93* 101  CO2 25 28  GLUCOSE 142* 105*  BUN <3* 5*  CREATININE 0.30* 0.42*  CALCIUM 7.1* 7.1*   LFT  Recent Labs  03/18/13 0730 03/19/13 0515  PROT 5.8* 5.0*  ALBUMIN 2.6* 2.2*  AST 100* 66*  ALT 31 22  ALKPHOS 219* 196*  BILITOT 1.4* 1.0  BILIDIR 0.7*  --   IBILI 0.7  --    PT/INR No results found for this basename: LABPROT, INR,  in the last 72 hours Hepatitis Panel No results found for this basename: HEPBSAG, HCVAB, HEPAIGM, HEPBIGM,  in the last 72 hours C-Diff No results found for this basename: CDIFFTOX,  in the last 72 hours Fecal Lactopherrin No results found for this basename: FECLLACTOFRN,  in the last 72 hours  Studies/Results: No results found.  Medications:  Scheduled: . diltiazem  240 mg Oral Daily  . enoxaparin (LOVENOX) injection  40 mg Subcutaneous Q24H  . folic acid  1 mg Oral Daily  . insulin aspart  0-9 Units Subcutaneous Q4H  . multivitamin with minerals  1 tablet Oral Daily  . ondansetron  4 mg Oral TID AC & HS   Or  . ondansetron (ZOFRAN) IV  4 mg Intravenous TID AC & HS  . pantoprazole  40 mg Oral Daily  . thiamine  100 mg  Oral Daily   Or  . thiamine  100 mg Intravenous Daily   Continuous: . sodium chloride    . dextrose 5 % and 0.9 % NaCl with KCl 20 mEq/L 85 mL/hr at 03/19/13 0643  . [name] KitchenTPN (CLINIMIX-E) Adult 40 mL/hr at 03/18/13 1749   And  . fat emulsion 240 mL (03/18/13 1748)  . [name] KitchenTPN (CLINIMIX-E) Adult     And  . fat emulsion      Assessment/Plan: 1) Nausea - ? Etiology. 2) Severe fatty liver. 3) Abnormal liver enzymes.   There appears to be a mild improvement.  I hope she can continue with this trend in improvement.  Her liver enzymes are also improving.   Plan: 1) Continue with supportive care.  LOS: 5 days   Carter Kassel D 03/19/2013, 4:15 PM

## 2013-03-19 NOTE — Progress Notes (Signed)
Patient and her mother requesting a PT eval to determine rehab options at discharge. Patient and mother expressed interest in [place], reporting that patient is having difficulty with mobility. Mother is concerned that patient lives alone and will not have the support that she needs at discharge. CSW contacted RN Joycelyn Schmid to inform her of PT eval request. Patient provided permission to contact mother with any updates. Mother's contact information: Eleny Cortez [phone] / [place], MSW, [facility]. [phone]

## 2013-03-19 NOTE — Progress Notes (Signed)
NUTRITION FOLLOW UP  Intervention:   TPN management per Pharmacy  Nutrition Dx:   Malnutrition related to chronic nausea as evidenced by po intake </= 75% of her needs for >/= 1 month and severe fat and muscle wasting; ongoing.   Goal:  Pt to meet >/= 90% of their estimated nutrition needs, met.   Monitor:  GI function, labs, weight trend  Assessment:   Pt admitted with abdominal pain, nausea, and vomiting. Pt admitted for lap chole which she had 1/15 but has remained very nauseated. Per MD note pt with severe nausea with elevated LFT's of unknown etiology, severe hepatic steatosis. Pt has been unable to tolerate any PO's and is starting TPN 1/20.  Per pt she has had nausea for about 1 year but it has gotten progressively worse over the last 6 months and it is when it started to effect her intake. Pt reports no appetite and has only been eating some soup once a day for at least the last month.   Pharmacy managing TPN. Refeeding labs being monitored and potassium, phosphorus, and magnesium levels being repleted.  Per MD notes 1/20 mom suspects pt is an alcoholic. Pt on MVI, thiamine, and folic acid.  Plan for home TPN due to intolerance to PO diet.   Height: Ht Readings from Last 1 Encounters:  03/14/13 5' 2"  (1.575 m)    Weight Status:   Wt Readings from Last 1 Encounters:  03/14/13 124 lb (56.246 kg)    Re-estimated needs:  Kcal: 1550-1700  Protein: 75-85 grams  Fluid: > 1.6 L/day  Skin: abd incision  Diet Order: Clear Liquid   Intake/Output Summary (Last 24 hours) at 03/19/13 0918 Last data filed at 03/19/13 0700  Gross per 24 hour  Intake   1050 ml  Output      0 ml  Net   1050 ml    Last BM: 1/18   Labs:   Recent Labs Lab 03/16/13 0416 03/18/13 0730 03/19/13 0515  NA 141 136* 141  K 3.0* 2.6* 3.1*  CL 100 93* 101  CO2 23 25 28   BUN <3* <3* 5*  CREATININE 0.35* 0.30* 0.42*  CALCIUM 7.7* 7.1* 7.1*  MG  --  0.8* 2.2  PHOS  --  2.0* 4.1  GLUCOSE  167* 142* 105*    CBG (last 3)   Recent Labs  03/18/13 2357 03/19/13 0400 03/19/13 0802  GLUCAP 123* 110* 120*    Scheduled Meds: . diltiazem  240 mg Oral Daily  . enoxaparin (LOVENOX) injection  40 mg Subcutaneous Q24H  . folic acid  1 mg Oral Daily  . insulin aspart  0-9 Units Subcutaneous Q4H  . multivitamin with minerals  1 tablet Oral Daily  . ondansetron  4 mg Oral TID AC & HS   Or  . ondansetron (ZOFRAN) IV  4 mg Intravenous TID AC & HS  . pantoprazole  40 mg Oral Daily  . potassium chloride  10 mEq Intravenous Q1 Hr x 3  . potassium chloride  10 mEq Intravenous Q1 Hr x 4  . thiamine  100 mg Oral Daily   Or  . thiamine  100 mg Intravenous Daily    Continuous Infusions: . sodium chloride    . dextrose 5 % and 0.9 % NaCl with KCl 20 mEq/L 85 mL/hr at 03/19/13 0643  . [name] KitchenTPN (CLINIMIX-E) Adult 40 mL/hr at 03/18/13 1749   And  . fat emulsion 240 mL (03/18/13 1748)  . [name] KitchenTPN (CLINIMIX-E) Adult  And  . fat emulsion      Russell, [place], [place] Pager [phone] After Hours Pager

## 2013-03-20 LAB — PHOSPHORUS: Phosphorus: 3.5 mg/dL (ref 2.3–4.6)

## 2013-03-20 LAB — GLUCOSE, CAPILLARY
GLUCOSE-CAPILLARY: 103 mg/dL — AB (ref 70–99)
GLUCOSE-CAPILLARY: 119 mg/dL — AB (ref 70–99)
GLUCOSE-CAPILLARY: 120 mg/dL — AB (ref 70–99)
GLUCOSE-CAPILLARY: 94 mg/dL (ref 70–99)
GLUCOSE-CAPILLARY: 99 mg/dL (ref 70–99)
Glucose-Capillary: 118 mg/dL — ABNORMAL HIGH (ref 70–99)
Glucose-Capillary: 83 mg/dL (ref 70–99)

## 2013-03-20 LAB — BASIC METABOLIC PANEL
BUN: 6 mg/dL (ref 6–23)
CHLORIDE: 106 meq/L (ref 96–112)
CO2: 25 mEq/L (ref 19–32)
Calcium: 7.9 mg/dL — ABNORMAL LOW (ref 8.4–10.5)
Creatinine, Ser: 0.37 mg/dL — ABNORMAL LOW (ref 0.50–1.10)
GFR calc Af Amer: >90 mL/min (ref >90)
GFR calc non Af Amer: >90 mL/min (ref >90)
GLUCOSE: 94 mg/dL (ref 70–99)
POTASSIUM: 4 meq/L (ref 3.7–5.3)
Sodium: 141 mEq/L (ref 137–147)

## 2013-03-20 LAB — MAGNESIUM: MAGNESIUM: 1.8 mg/dL (ref 1.5–2.5)

## 2013-03-20 MED ORDER — INSULIN ASPART 100 UNIT/ML ~~LOC~~ SOLN
0.0000 [IU] | SUBCUTANEOUS | Status: DC
  Administered 2013-03-21: 1 [IU] via SUBCUTANEOUS

## 2013-03-20 MED ORDER — FAT EMULSION 20 % IV EMUL
240.0000 mL | INTRAVENOUS | Status: AC
  Administered 2013-03-20: 240 mL via INTRAVENOUS
  Filled 2013-03-20 (×2): qty 250

## 2013-03-20 MED ORDER — TRACE MINERALS CR-CU-F-FE-I-MN-MO-SE-ZN IV SOLN
INTRAVENOUS | Status: AC
  Administered 2013-03-20 (×2): via INTRAVENOUS
  Filled 2013-03-20: qty 2000

## 2013-03-20 MED ORDER — SODIUM CHLORIDE 0.9 % IV SOLN: INTRAVENOUS | Status: AC

## 2013-03-20 NOTE — Progress Notes (Signed)
PARENTERAL NUTRITION CONSULT NOTE - follow up Pharmacy Consult for TPN Indication: intolerance to enteral feeding  Allergies  Allergen Reactions  . Doxycycline Other (See Comments)    Reaction unknown  . Tetracycline Hcl Other (See Comments)    Reaction unknown    Patient Measurements: Height: _0  (157.5 cm) Weight: 124 lb (56.246 kg) IBW/kg (Calculated) : 50.1  Vital Signs: Temp: 97.7 F (36.5 C) (01/21 0429) BP: 104/62 mmHg (01/21 0429) Pulse Rate: 88 (01/21 0429) Intake/Output from previous day: 01/20 0701 - 01/21 0700 In: 2166.3 [P.O.:1060; I.V.:111.3; TPN:995] Out: -  Intake/Output from this shift:    Labs:  Recent Labs  03/18/13 0730 03/19/13 0515  WBC 8.4 9.3  HGB 10.6* 9.3*  HCT 30.3* 27.4*  PLT 185 197     Recent Labs  03/18/13 0730 03/19/13 0511 03/19/13 0515 03/20/13 0625  NA 136*  --  141 141  K 2.6*  --  3.1* 4.0  CL 93*  --  101 106  CO2 25  --  28 25  GLUCOSE 142*  --  105* 94  BUN <3*  --  5* 6  CREATININE 0.30*  --  0.42* 0.37*  CALCIUM 7.1*  --  7.1* 7.9*  MG 0.8*  --  2.2 1.8  PHOS 2.0*  --  4.1 3.5  PROT 5.8*  --  5.0*  --   ALBUMIN 2.6*  --  2.2*  --   AST 100*  --  66*  --   ALT 31  --  22  --   ALKPHOS 219*  --  196*  --   BILITOT 1.4*  --  1.0  --   BILIDIR 0.7*  --   --   --   IBILI 0.7  --   --   --   PREALBUMIN  --   --  12.1*  --   TRIG  --  169*  --   --    Estimated Creatinine Clearance: 60.6 ml/min (by C-G formula based on Cr of 0.37).    Recent Labs  03/20/13 0052 03/20/13 0355 03/20/13 0807  GLUCAP 118* 99 103*   Insulin Requirements in the past 24 hours:  1 unit SSI; CBGs all <150  Nutritional Goals: per RD 03/18/13 1550-1700 kCal, 75-85 grams of protein per day  Current Nutrition:  Clinimix E 5/15 to 70 ml/hr + lipids at 10 ml/hr to provide 84 gm protein and 1637 kcals- providing 100% of needs, at goal rate.  Assessment: 58 yo lady to start TPN as she does not tolerate enteral feeding per MD.   She has had severe nausea s/p lap chole, elevated LFTs etiology unclear and severe hepatic steatosis. She had no improvement in symptoms with reglan. Tolerating TPN.  Pt placed on alcohol withdrawal protocol with MVI, FA, and thiamine.  She is taking PO PPI and PO vicodin.  IVF NS is KVO'd.   Plan is to DC home with PICC and TPN.   GI: s/p cholecystectomy 1/15,  She remains nauseated and weak, MRCP revealed severe hepatic steatosis with biliary abnormalities, on reglan IV qid, last BM 1/20; surgery changing diet to allow her to have what she wants- not eating d/t nausea; malnutrition related to chronic nausea  Lytes: Na 141, K 4, Mag 1.8, CorCa 9.3, Phos 3.5- all maintained after repletion yesterday  Renal: SrCr 0.37, CrCl ~42m/min; Urinated 3 times in past 24 hours  Hepatobil: Alk phos 196, AST/ALT 66/22, tbili 1, alb 2.2  TPN Access:  PICC line placed 1/19 TPN day#: 3  Plan:  1. Starting tonight, will cycle Clinimix E 5/15. From 1800-1900, TPN to run at 48m/hr, then from 1900-1100 (16 hours), TPN to run at 923mhr, then from 1100-1200 TPN to run at 5028mr.  2. Lipid emulsion 20% also to be cycled- will run at 103m51m from 1800-1200 3. [facility] (1200-1800) 4. CBGs and SSI at following times: 0500, 1300, 1600, 2000 5. Continue PO MVI, FA, thiamine, PPI 6. TPN labs as ordered  Lee-Anne Flicker D. Erla Bacchi, PharmD, BCPS Clinical Pharmacist Pager: [phone]/2015 10:46 AM

## 2013-03-20 NOTE — Progress Notes (Signed)
6 Days Post-Op  Subjective: Nausea is better but she says she is weak and her back and lower body are hurting  Objective: Vital signs in last 24 hours: Temp:  [97.7 F (36.5 C)-98.4 F (36.9 C)] 97.7 F (36.5 C) (01/21 0429) Pulse Rate:  [78-88] 88 (01/21 0429) Resp:  [16-18] 16 (01/21 0429) BP: (86-104)/(50-62) 104/62 mmHg (01/21 0429) SpO2:  [97 %-99 %] 97 % (01/21 0429) Last BM Date: 03/19/13  Intake/Output from previous day: 01/20 0701 - 01/21 0700 In: 2166.3 [P.O.:1060; I.V.:111.3; TPN:995] Out: -  Intake/Output this shift:    Abdomen soft, benign  Lab Results:   Recent Labs  03/18/13 0730 03/19/13 0515  WBC 8.4 9.3  HGB 10.6* 9.3*  HCT 30.3* 27.4*  PLT 185 197   BMET  Recent Labs  03/19/13 0515 03/20/13 0625  NA 141 141  K 3.1* 4.0  CL 101 106  CO2 28 25  GLUCOSE 105* 94  BUN 5* 6  CREATININE 0.42* 0.37*  CALCIUM 7.1* 7.9*   PT/INR No results found for this basename: LABPROT, INR,  in the last 72 hours ABG No results found for this basename: PHART, PCO2, PO2, HCO3,  in the last 72 hours  Studies/Results: No results found.  Anti-infectives: Anti-infectives   Start     Dose/Rate Route Frequency Ordered Stop   03/14/13 0600  ceFAZolin (ANCEF) IVPB 2 g/50 mL premix     2 g 100 mL/hr over 30 Minutes Intravenous On call to O.R. 03/13/13 1359 03/14/13 0727      Assessment/Plan: s/p Procedure(s): LAPAROSCOPIC CHOLECYSTECTOMY WITH ATTEMPTED INTRAOPERATIVE CHOLANGIOGRAM (N/A)  Nausea, fatty liver, deconditioning  Again, all this was present preop.  I do not think the lap chole helped her at all. Will continue TNA and let her have anything she wants to eat. If she does not improve, she will need to go to either SNF or home with home health on TNA  LOS: 6 days    Burak Zerbe A 03/20/2013

## 2013-03-20 NOTE — Plan of Care (Signed)
Problem: Phase II Progression Outcomes Goal: Tolerating diet Outcome: Not Progressing Patient on clear liquids, will not eat, has TPN running at 81ml/hr

## 2013-03-20 NOTE — Evaluation (Signed)
Physical Therapy Evaluation Patient Details Name: Terri Ayala MRN: [id number] DOB: Jul 21, 1955 Today's Date: 03/20/2013 Time: 5643-3295 PT Time Calculation (min): 23 min  PT Assessment / Plan / Recommendation History of Present Illness  Admitted with N/V, gallstones, liver dysfunction; s/p lap chole; of note, has been on CIWA protocol  Clinical Impression  Pt admitted with above. Pt currently with functional limitations due to the deficits listed below (see PT Problem List).  Pt will benefit from skilled PT to increase their independence and safety with mobility to allow discharge to the venue listed below.       PT Assessment  Patient needs continued PT services    Follow Up Recommendations  Other (comment) (post-acute Rehab)    Does the patient have the potential to tolerate intense rehabilitation      Barriers to Discharge Decreased caregiver support Mother is not available to help her this week, but will be able to assist her at home later this month as much as necessary    Equipment Recommendations  Rolling walker with 5" wheels;3in1 (PT)    Recommendations for Other Services OT consult   Frequency Min 3X/week    Precautions / Restrictions Precautions Precautions: Fall   Pertinent Vitals/Pain 7/10 bil LEs and back patient repositioned for comfort       Mobility  Transfers Overall transfer level: Needs assistance Equipment used: Rolling walker (2 wheeled) Transfers: Sit to/from Stand Sit to Stand: Mod assist General transfer comment: Cues for hand placement and safety; mod assist for power-up Ambulation/Gait Ambulation/Gait assistance: Min guard Ambulation Distance (Feet): 100 Feet Assistive device: Rolling walker (2 wheeled) Gait Pattern/deviations: Trunk flexed Gait velocity: slowed Gait velocity interpretation: <1.8 ft/sec, indicative of risk for recurrent falls General Gait Details: Ambulates with trunk flexed; reports bil LE pain in what sounds like  pants distribution, particularly painful are the bottoms of her feet    Exercises     PT Diagnosis: Difficulty walking;Generalized weakness;Acute pain  PT Problem List: Decreased strength;Decreased activity tolerance;Decreased balance;Decreased mobility;Decreased knowledge of use of DME;Pain PT Treatment Interventions: DME instruction;Gait training;Stair training;Functional mobility training;Therapeutic activities;Therapeutic exercise;Patient/family education     PT Goals(Current goals can be found in the care plan section) Acute Rehab PT Goals Patient Stated Goal: get better; get strength back PT Goal Formulation: With patient Time For Goal Achievement: 04/03/13 Potential to Achieve Goals: Good  Visit Information  Last PT Received On: 03/20/13 Assistance Needed: +1 History of Present Illness: Admitted with N/V, gallstones, liver dysfunction; s/p lap chole; of note, has been on CIWA protocol       Prior [facility] expects to be discharged to:: Private residence Living Arrangements: Alone Available Help at Discharge: Family;Available PRN/intermittently (Reports mother can assist her after rehab stay) Type of Home: House Home Access: Stairs to enter Entrance Stairs-Number of Steps: 1 Entrance Stairs-Rails: None Home Layout: Two level;Bed/bath upstairs Alternate Level Stairs-Number of Steps: flight Alternate Level Stairs-Rails: Right Home Equipment: None Prior Function Level of Independence: Independent    Cognition  Cognition Arousal/Alertness: Awake/alert Behavior During Therapy: WFL for tasks assessed/performed Overall Cognitive Status: Within Functional Limits for tasks assessed    Extremity/Trunk Assessment Upper Extremity Assessment Upper Extremity Assessment: Overall WFL for tasks assessed Lower Extremity Assessment Lower Extremity Assessment: Generalized weakness   Balance    End of Session PT - End of Session Equipment Utilized  During Treatment: Gait belt Activity Tolerance: Patient limited by fatigue Patient left: in chair;with call bell/phone within reach Nurse Communication: Mobility  status  GP     Terri Ayala, Terri Ayala  03/20/2013, 4:52 PM

## 2013-03-21 DIAGNOSIS — R5381 Other malaise: Secondary | ICD-10-CM

## 2013-03-21 LAB — COMPREHENSIVE METABOLIC PANEL
ALBUMIN: 2.3 g/dL — AB (ref 3.5–5.2)
ALT: 18 U/L (ref 0–35)
AST: 100 U/L — AB (ref 0–37)
Alkaline Phosphatase: 315 U/L — ABNORMAL HIGH (ref 39–117)
BILIRUBIN TOTAL: 0.8 mg/dL (ref 0.3–1.2)
BUN: 8 mg/dL (ref 6–23)
CHLORIDE: 102 meq/L (ref 96–112)
CO2: 27 mEq/L (ref 19–32)
CREATININE: 0.4 mg/dL — AB (ref 0.50–1.10)
Calcium: 8.5 mg/dL (ref 8.4–10.5)
GFR calc Af Amer: >90 mL/min (ref >90)
GFR calc non Af Amer: >90 mL/min (ref >90)
Glucose, Bld: 101 mg/dL — ABNORMAL HIGH (ref 70–99)
Potassium: 3.9 mEq/L (ref 3.7–5.3)
SODIUM: 140 meq/L (ref 137–147)
Total Protein: 5.4 g/dL — ABNORMAL LOW (ref 6.0–8.3)

## 2013-03-21 LAB — GLUCOSE, CAPILLARY
GLUCOSE-CAPILLARY: 122 mg/dL — AB (ref 70–99)
Glucose-Capillary: 87 mg/dL (ref 70–99)
Glucose-Capillary: 95 mg/dL (ref 70–99)

## 2013-03-21 LAB — MAGNESIUM: Magnesium: 1.8 mg/dL (ref 1.5–2.5)

## 2013-03-21 LAB — PHOSPHORUS: PHOSPHORUS: 4.2 mg/dL (ref 2.3–4.6)

## 2013-03-21 MED ORDER — SODIUM CHLORIDE 0.9 % IV SOLN
INTRAVENOUS | Status: DC
  Administered 2013-03-24 – 2013-03-25 (×2): 20 mL via INTRAVENOUS
  Administered 2013-03-26: 06:00:00 via INTRAVENOUS

## 2013-03-21 MED ORDER — SUMATRIPTAN SUCCINATE 100 MG PO TABS
100.0000 mg | ORAL_TABLET | Freq: Two times a day (BID) | ORAL | Status: DC | PRN
  Administered 2013-03-21 – 2013-03-25 (×4): 100 mg via ORAL
  Filled 2013-03-21 (×4): qty 1

## 2013-03-21 MED ORDER — TRACE MINERALS CR-CU-F-FE-I-MN-MO-SE-ZN IV SOLN: INTRAVENOUS | Status: DC

## 2013-03-21 MED ORDER — FAT EMULSION 20 % IV EMUL
240.0000 mL | INTRAVENOUS | Status: AC
  Administered 2013-03-21: 240 mL via INTRAVENOUS
  Filled 2013-03-21 (×2): qty 250

## 2013-03-21 MED ORDER — CLINIMIX E/DEXTROSE (5/15) 5 % IV SOLN
INTRAVENOUS | Status: AC
  Administered 2013-03-21: 18:00:00 via INTRAVENOUS
  Filled 2013-03-21: qty 2000

## 2013-03-21 MED ORDER — INSULIN ASPART 100 UNIT/ML ~~LOC~~ SOLN
0.0000 [IU] | SUBCUTANEOUS | Status: DC
  Administered 2013-03-22 (×2): 1 [IU] via SUBCUTANEOUS

## 2013-03-21 NOTE — Progress Notes (Signed)
Rehab Admissions Coordinator Note:  Patient was screened by Aarica Wax L for appropriateness for an Inpatient Acute Rehab Consult.  At this time, we are recommending Inpatient Rehab consult. We spoke with Manuela Schwartz case Freight forwarder and also recommend getting OT eval ordered as well.  Nanetta Batty, PT Rehabilitation Admissions Coordinator [phone]

## 2013-03-21 NOTE — Progress Notes (Signed)
7 Days Post-Op  Subjective: Doing about the same  Objective: Vital signs in last 24 hours: Temp:  [97.8 F (36.6 C)-98.5 F (36.9 C)] 97.9 F (36.6 C) (01/22 0418) Pulse Rate:  [86-110] 110 (01/22 0418) Resp:  [18-20] 18 (01/22 0418) BP: (99-128)/(57-84) 127/84 mmHg (01/22 0418) SpO2:  [99 %-100 %] 99 % (01/22 0418) Last BM Date: 03/19/13  Intake/Output from previous day: 01/21 0701 - 01/22 0700 In: 1735.3 [P.O.:702; I.V.:206.7; TPN:826.7] Out: -  Intake/Output this shift:    Abdomen soft, non tender  Lab Results:   Recent Labs  03/19/13 0515  WBC 9.3  HGB 9.3*  HCT 27.4*  PLT 197   BMET  Recent Labs  03/20/13 0625 03/21/13 0537  NA 141 140  K 4.0 3.9  CL 106 102  CO2 25 27  GLUCOSE 94 101*  BUN 6 8  CREATININE 0.37* 0.40*  CALCIUM 7.9* 8.5   PT/INR No results found for this basename: LABPROT, INR,  in the last 72 hours ABG No results found for this basename: PHART, PCO2, PO2, HCO3,  in the last 72 hours  Studies/Results: No results found.  Anti-infectives: Anti-infectives   Start     Dose/Rate Route Frequency Ordered Stop   03/14/13 0600  ceFAZolin (ANCEF) IVPB 2 g/50 mL premix     2 g 100 mL/hr over 30 Minutes Intravenous On call to O.R. 03/13/13 1359 03/14/13 0727      Assessment/Plan: s/p Procedure(s): LAPAROSCOPIC CHOLECYSTECTOMY WITH ATTEMPTED INTRAOPERATIVE CHOLANGIOGRAM (N/A)  From a surgical standpoint, she is completely stable.  The lap chole did not help her at all from her preop symptoms.  Her condition remains completely medical. From my standpoint, she can go to rehab.  She will need further follow up with GI regarding her fatty liver.  Dr. Collene Mares may also have an opinion as to who may need to see her at [facility].  LOS: 7 days    Terri Ayala A 03/21/2013

## 2013-03-21 NOTE — Progress Notes (Signed)
[facility] NOTE  Pharmacy Consult for TPN Indication: intolerance to enteral feeding  Allergies  Allergen Reactions  . Doxycycline Other (See Comments)    Reaction unknown  . Tetracycline Hcl Other (See Comments)    Reaction unknown    Patient Measurements: Height: 5' 2"  (157.5 cm) Weight: 124 lb (56.246 kg) IBW/kg (Calculated) : 50.1  Vital Signs: Temp: 97.9 F (36.6 C) (01/22 0418) Temp src: Oral (01/22 0418) BP: 127/84 mmHg (01/22 0418) Pulse Rate: 110 (01/22 0418) Intake/Output from previous day: 01/21 0701 - 01/22 0700 In: 1735.3 [P.O.:702; I.V.:206.7; TPN:826.7] Out: -  Intake/Output from this shift:    Labs:  Recent Labs  03/19/13 0515  WBC 9.3  HGB 9.3*  HCT 27.4*  PLT 197     Recent Labs  03/19/13 0511 03/19/13 0515 03/20/13 0625 03/21/13 0537  NA  --  141 141 140  K  --  3.1* 4.0 3.9  CL  --  101 106 102  CO2  --  28 25 27   GLUCOSE  --  105* 94 101*  BUN  --  5* 6 8  CREATININE  --  0.42* 0.37* 0.40*  CALCIUM  --  7.1* 7.9* 8.5  MG  --  2.2 1.8 1.8  PHOS  --  4.1 3.5 4.2  PROT  --  5.0*  --  5.4*  ALBUMIN  --  2.2*  --  2.3*  AST  --  66*  --  100*  ALT  --  22  --  18  ALKPHOS  --  196*  --  315*  BILITOT  --  1.0  --  0.8  PREALBUMIN  --  12.1*  --   --   TRIG 169*  --   --   --    Estimated Creatinine Clearance: 60.6 ml/min (by C-G formula based on Cr of 0.4).    Recent Labs  03/20/13 2031 03/20/13 2353 03/21/13 0447  GLUCAP 119* 120* 122*   Insulin Requirements in the past 24 hours:  1 unit SSI; CBGs all <150  Nutritional Goals: per RD 03/18/13 1550-1700 kCal, 75-85 grams of protein per day  Current Nutrition:  Clinimix E 5/15 + lipid emulsion 20% on cycle- receiving 1662m total providing 84g protein and 1767kcal daily  Assessment: 58yo lady to start TPN as she does not tolerate enteral feeding per MD.  She has had severe nausea s/p lap chole, elevated LFTs etiology unclear and severe hepatic  steatosis. She had no improvement in symptoms with reglan. Tolerating TPN.  Pt placed on alcohol withdrawal protocol with MVI, FA, and thiamine.  She is taking PO PPI and PO vicodin.  IVF NS is KVO'd.   Plan is to DC home or to SNF with PICC and TPN.   GI: s/p cholecystectomy 1/15,  She remains nauseated and weak, MRCP revealed severe hepatic steatosis with biliary abnormalities, on reglan IV qid, last BM 1/20; surgery changing diet to allow her to have what she wants- not eating d/t nausea; malnutrition related to chronic nausea  Lytes: Na 140, K 3.9, Mag 1.8, CorCa 9.3, Phos 4.2- all WNL after 1st night of cyclic TPN  Renal: SrCr 0.4, CrCl ~612mmin; Urinated 4 times in past 24 hours  Hepatobil: Alk phos increased again to 315, AST up to 100, ALT 18, tbili 0.8, alb 2.3, trigs 169, prealbumin 12.1 (1/20)  TPN Access: PICC line placed 1/19 TPN day#: 4  Plan:  1. Starting tonight, will cycle Clinimix E  5/15 over 12 hours. From 1800-1900, TPN to run at 59m/hr, then from 1900-0500 (10 hours), TPN to run at 1514mhr, then from 0500-0600 TPN to run at 5075mr.  2. Lipid emulsion 20% also to be cycled- will run at 40m30m from 1800-0600 3. IVF NS at [facility] when patient not on TPN (0600-1800) 4. Will only add full trace elements to TPN as patient is receiving multivitamin PO 5. CBGs and SSI at following times: 0500, 0700, 1600, 2000 6. BMET, mag and phos tomorrow morning. Other TPN labs as ordered  Srinivas Lippman D. Skyra Crichlow, PharmD, BCPS Clinical Pharmacist Pager: [phone] 10:37 AM

## 2013-03-21 NOTE — Consult Note (Signed)
Physical Medicine and Rehabilitation Consult Reason for Consult: Deconditioning/cholecystitis Referring Physician: Dr. Ninfa Linden   HPI: Terri Ayala is a 58 y.o. right-handed female with history of alcohol use admitted 03/14/2013 with right-sided abdominal pain with nausea vomiting. Hiatus scan was negative and gastric emptying scan only revealed a very mild delay in gastric emptying. Decision was made to pursue a laparoscopic cholecystectomy 03/14/2013 per Dr. Ninfa Linden and the gallbladder was mildly thickened. Close monitoring of elevated liver function studies. The CT scan of the abdomen confirmed a severe fatty infiltration but no evidence of ductal dilation or stones. MRCP revealed severe hepatic steatosis but no biliary abnormalities. Advise conservative care per gastroenterology. Patient is maintained on TPN for nutritional support. Subcutaneous Lovenox for DVT prophylaxis. Physical therapy evaluation 03/20/2013 noted deconditioning recommendations for physical medicine rehabilitation consult.  Complains of hypersensitivity of both feet and to a lesser extent both legs. Denies any trauma to that area. Denies any previous history of problems.  Review of Systems  Gastrointestinal: Positive for nausea, vomiting and abdominal pain.  Musculoskeletal: Positive for back pain.  Neurological: Positive for headaches.  Psychiatric/Behavioral:       Anxiety  All other systems reviewed and are negative.   Past Medical History  Diagnosis Date  . Low back pain   . Anxiety   . Migraines   . GOITER, MULTINODULAR 08/17/2009  . OSTEOPOROSIS 08/17/2009  . ASYMPTOMATIC POSTMENOPAUSAL STATUS 08/17/2009  . DYSPHAGIA 06/19/2007  . Supraventricular tachycardia   . Hyperlipidemia   . Mitral valve prolapse   . PONV (postoperative nausea and vomiting)    Past Surgical History  Procedure Laterality Date  . Laporoscopic abdominal surgery    . Cervix surgery    . Cholecystectomy N/A 03/14/2013    Procedure:  LAPAROSCOPIC CHOLECYSTECTOMY WITH ATTEMPTED INTRAOPERATIVE CHOLANGIOGRAM;  Surgeon: Harl Bowie, MD;  Location: [facility];  Service: General;  Laterality: N/A;   Family History  Problem Relation Age of Onset  . Thyroid disease Neg Hx   . Goiter Neg Hx   . Cancer Father   . Osteoporosis Mother    Social History:  reports that she has never smoked. She has never used smokeless tobacco. She reports that she drinks alcohol. She reports that she does not use illicit drugs. Allergies:  Allergies  Allergen Reactions  . Doxycycline Other (See Comments)    Reaction unknown  . Tetracycline Hcl Other (See Comments)    Reaction unknown   Medications Prior to Admission  Medication Sig Dispense Refill  . alendronate (FOSAMAX) 70 MG tablet Take 1 tablet (70 mg total) by mouth every 7 (seven) days. Take with a full glass of water on an empty stomach.  4 tablet  11  . ALPRAZolam (XANAX) 0.5 MG tablet Take 1 tablet (0.5 mg total) by mouth daily as needed. For anxiety  90 tablet  1  . aspirin 81 MG tablet Take 81 mg by mouth daily.        . calcium carbonate 200 MG capsule Take 200 mg by mouth daily.       . cholecalciferol (VITAMIN D) 1000 UNITS tablet Take 1,000 Units by mouth daily.        [name] Kitchen diltiazem (CARDIZEM CD) 240 MG 24 hr capsule Take 1 capsule (240 mg total) by mouth daily.  30 capsule  11  . glucosamine-chondroitin 500-400 MG tablet Take 1 tablet by mouth daily.       . metoCLOPramide (REGLAN) 10 MG tablet Take 10 mg by mouth 3 (three)  times daily before meals.       . norethindrone-ethinyl estradiol (FEMHRT 1/5) 1-5 MG-MCG TABS Take 1 tablet by mouth daily.       . ondansetron (ZOFRAN) 8 MG tablet Take 8 mg by mouth every 8 (eight) hours as needed for nausea.       . pantoprazole (PROTONIX) 40 MG tablet Take 1 tablet (40 mg total) by mouth daily.  30 tablet  11  . simvastatin (ZOCOR) 20 MG tablet Take 1 tablet (20 mg total) by mouth at bedtime.  30 tablet  11  . SUMAtriptan (IMITREX) 100  MG tablet Take 100 mg by mouth every 2 (two) hours as needed for migraine or headache. May repeat in 2 hours if headache persists or recurs.      . Ferrous Sulfate (IRON) 325 (65 FE) MG TABS Take by mouth.        Home: Home Living Family/patient expects to be discharged to:: Private residence Living Arrangements: Alone Available Help at Discharge: Family;Available PRN/intermittently (Reports mother can assist her after rehab stay) Type of Home: House Home Access: Stairs to enter Entrance Stairs-Number of Steps: 1 Entrance Stairs-Rails: None Home Layout: Two level;Bed/bath upstairs Alternate Level Stairs-Number of Steps: flight Alternate Level Stairs-Rails: Right Home Equipment: None  Functional History:   Functional Status:  Mobility:     Ambulation/Gait Ambulation Distance (Feet): 100 Feet Gait velocity: slowed General Gait Details: Ambulates with trunk flexed; reports bil LE pain in what sounds like pants distribution, particularly painful are the bottoms of her feet    ADL:    Cognition: Cognition Overall Cognitive Status: Within Functional Limits for tasks assessed Orientation Level: Oriented X4 Cognition Arousal/Alertness: Awake/alert Behavior During Therapy: WFL for tasks assessed/performed Overall Cognitive Status: Within Functional Limits for tasks assessed  Blood pressure 127/84, pulse 110, temperature 97.9 F (36.6 C), temperature source Oral, resp. rate 18, height 5\' 2"  (1.575 m), weight 56.246 kg (124 lb), SpO2 99.00%. Physical Exam  Constitutional: She is oriented to person, place, and time.  58 year old female  HENT:  Head: Normocephalic.  Eyes: EOM are normal.  Neck: Normal range of motion. Neck supple. No thyromegaly present.  Cardiovascular: Normal rate and regular rhythm.   Respiratory: Effort normal and breath sounds normal. No respiratory distress.  GI: Soft. Bowel sounds are normal. She exhibits no distension.  Neurological: She is alert and  oriented to person, place, and time.  Mood is flat but appropriate  Skin:  Incision sites clean and dry   hypersensitivity to touch on the feet Lower extremities have no lesions. No evidence of joint swelling Full range of motion hips knees and ankles as well as toes. Ounces or good Feet are warm Light touch sensation is reduced in the toes compared to the knees  Results for orders placed during the hospital encounter of 03/14/13 (from the past 24 hour(s))  GLUCOSE, CAPILLARY     Status: None   Collection Time    03/20/13 11:14 AM      Result Value Range   Glucose-Capillary 83  70 - 99 mg/dL   Comment 1 Notify RN    GLUCOSE, CAPILLARY     Status: None   Collection Time    03/20/13  4:23 PM      Result Value Range   Glucose-Capillary 94  70 - 99 mg/dL   Comment 1 Notify RN    GLUCOSE, CAPILLARY     Status: Abnormal   Collection Time    03/20/13  8:31 PM  Result Value Range   Glucose-Capillary 119 (*) 70 - 99 mg/dL   Comment 1 Notify RN    GLUCOSE, CAPILLARY     Status: Abnormal   Collection Time    03/20/13 11:53 PM      Result Value Range   Glucose-Capillary 120 (*) 70 - 99 mg/dL  GLUCOSE, CAPILLARY     Status: Abnormal   Collection Time    03/21/13  4:47 AM      Result Value Range   Glucose-Capillary 122 (*) 70 - 99 mg/dL  COMPREHENSIVE METABOLIC PANEL     Status: Abnormal   Collection Time    03/21/13  5:37 AM      Result Value Range   Sodium 140  137 - 147 mEq/L   Potassium 3.9  3.7 - 5.3 mEq/L   Chloride 102  96 - 112 mEq/L   CO2 27  19 - 32 mEq/L   Glucose, Bld 101 (*) 70 - 99 mg/dL   BUN 8  6 - 23 mg/dL   Creatinine, Ser 0.40 (*) 0.50 - 1.10 mg/dL   Calcium 8.5  8.4 - 10.5 mg/dL   Total Protein 5.4 (*) 6.0 - 8.3 g/dL   Albumin 2.3 (*) 3.5 - 5.2 g/dL   AST 100 (*) 0 - 37 U/L   ALT 18  0 - 35 U/L   Alkaline Phosphatase 315 (*) 39 - 117 U/L   Total Bilirubin 0.8  0.3 - 1.2 mg/dL   GFR calc non Af Amer >90  >90 mL/min   GFR calc Af Amer >90  >90 mL/min   MAGNESIUM     Status: None   Collection Time    03/21/13  5:37 AM      Result Value Range   Magnesium 1.8  1.5 - 2.5 mg/dL  PHOSPHORUS     Status: None   Collection Time    03/21/13  5:37 AM      Result Value Range   Phosphorus 4.2  2.3 - 4.6 mg/dL   No results found.  Assessment/Plan: Diagnosis: Deconditioning secondary to abdominal pain 1. Does the need for close, 24 hr/day medical supervision in concert with the patient's rehab needs make it unreasonable for this patient to be served in a less intensive setting? No 2. Co-Morbidities requiring supervision/potential complications: Probable peripheral neuropathy, poor nutrition, history of alcohol abuse 3. Due to skin/wound care, medication administration, pain management and patient education, does the patient require 24 hr/day rehab nursing? Potentially 4. Does the patient require coordinated care of a physician, rehab nurse, Not applicable to address physical and functional deficits in the context of the above medical diagnosis(es)? No Addressing deficits in the following areas: locomotion, strength and transferring 5. Can the patient actively participate in an intensive therapy program of at least 3 hrs of therapy per day at least 5 days per week? Not applicable 6. The potential for patient to make measurable gains while on inpatient rehab is Good for SNF level 7. Anticipated functional outcomes upon discharge from inpatient rehab are modified independent mobility with PT, modified independent ADLs with OT, NA with SLP. 8. Estimated rehab length of stay to reach the above functional goals is: NA 9. Does the patient have adequate social supports to accommodate these discharge functional goals? Potentially 10. Anticipated D/C setting: Home 11. Anticipated post D/C treatments: [facility] therapy 12. Overall Rehab/Functional Prognosis: fair  RECOMMENDATIONS: This patient's condition is appropriate for continued rehabilitative care in the  following setting: SNF Patient has  agreed to participate in recommended program. Potentially Note that insurance prior authorization may be required for reimbursement for recommended care.  Comment: Mobility level is too high to justify in hospital rehabilitation. Good candidate for SNF    03/21/2013

## 2013-03-22 LAB — GLUCOSE, CAPILLARY
GLUCOSE-CAPILLARY: 120 mg/dL — AB (ref 70–99)
GLUCOSE-CAPILLARY: 127 mg/dL — AB (ref 70–99)
GLUCOSE-CAPILLARY: 135 mg/dL — AB (ref 70–99)
Glucose-Capillary: 107 mg/dL — ABNORMAL HIGH (ref 70–99)
Glucose-Capillary: 139 mg/dL — ABNORMAL HIGH (ref 70–99)

## 2013-03-22 MED ORDER — FAT EMULSION 20 % IV EMUL
240.0000 mL | INTRAVENOUS | Status: DC
  Administered 2013-03-22: 240 mL via INTRAVENOUS
  Filled 2013-03-22 (×2): qty 250

## 2013-03-22 MED ORDER — CLINIMIX E/DEXTROSE (5/15) 5 % IV SOLN
INTRAVENOUS | Status: DC
  Administered 2013-03-22: 17:00:00 via INTRAVENOUS
  Filled 2013-03-22: qty 2000

## 2013-03-22 MED ORDER — BOOST / RESOURCE BREEZE PO LIQD
1.0000 | Freq: Three times a day (TID) | ORAL | Status: DC
Start: 2013-03-22 — End: 2013-03-26
  Administered 2013-03-22 – 2013-03-26 (×10): 1 via ORAL

## 2013-03-22 NOTE — Progress Notes (Signed)
[facility] NOTE  Pharmacy Consult for TPN Indication: intolerance to enteral feeding  Allergies  Allergen Reactions  . Doxycycline Other (See Comments)    Reaction unknown  . Tetracycline Hcl Other (See Comments)    Reaction unknown    Patient Measurements: Height: 5' 2" (157.5 cm) Weight: 124 lb (56.246 kg) IBW/kg (Calculated) : 50.1  Vital Signs: Temp: 98 F (36.7 C) (01/23 0528) BP: 114/79 mmHg (01/23 0528) Pulse Rate: 98 (01/23 0528) Intake/Output from previous day: 01/22 0701 - 01/23 0700 In: 1844 [P.O.:120; I.V.:240; XLK:4401] Out: 650 [Urine:650] Intake/Output from this shift:    Labs: No results found for this basename: WBC, HGB, HCT, PLT, APTT, INR,  in the last 72 hours   Recent Labs  03/20/13 0625 03/21/13 0537  NA 141 140  K 4.0 3.9  CL 106 102  CO2 25 27  GLUCOSE 94 101*  BUN 6 8  CREATININE 0.37* 0.40*  CALCIUM 7.9* 8.5  MG 1.8 1.8  PHOS 3.5 4.2  PROT  --  5.4*  ALBUMIN  --  2.3*  AST  --  100*  ALT  --  18  ALKPHOS  --  315*  BILITOT  --  0.8   Estimated Creatinine Clearance: 60.6 ml/min (by C-G formula based on Cr of 0.4).    Recent Labs  03/21/13 1956 03/22/13 0205 03/22/13 0645  GLUCAP 95 120* 127*   Insulin Requirements in the past 24 hours:  1 unit SSI; CBGs all <150  Nutritional Goals:  1550-1700 kCal, 75-85 grams of protein per day per RD 1/20  Current Nutrition:  Clinimix E 5/15 + lipid emulsion 20% on cycle - of note, by error pts overnight rate was not being increased to rate of 112m/hr for 10 hours but only 934mhr  Assessment: [age]o lady to start TPN as she does not tolerate enteral feeding per MD.  She has had severe nausea s/p lap chole, elevated LFTs etiology unclear and severe hepatic steatosis. She had no improvement in symptoms with reglan. Tolerating TPN.  Pt placed on alcohol withdrawal protocol with MVI, FA, and thiamine.  She is taking PO PPI and PO vicodin.  IVF NS is KVO'd. Plan is to  DC home or to SNF/rehab with PICC and TPN.   GI: s/p cholecystectomy 1/15,  She remains nauseated and weak, MRCP revealed severe hepatic steatosis with biliary abnormalities, last BM 1/21 - pt is taking some PO diet but minimal appetite  Lytes: Na 140, K 3.9, Mag 1.8, CorCa 9.3, Phos 4.2 as of 1/22  Renal: SrCr 0.4, CrCl ~606min, UOP 0.5ml65m/hr  Hepatobil: Alk phos increased again to 315, AST up to 100, ALT 18, tbili 0.8, alb 2.3, trigs 169, prealbumin 12.1 (1/20)  TPN Access: PICC line placed 1/19 TPN day#: 5  Plan:  1. Cycle Clinimix E 5/15 over 12 hours. From 1800-1900, TPN to run at 50mL58m then from 1900-0500 (10 hours), TPN to run at 156mL/4mthen from 0500-0600 TPN to run at 50mL/h53mPN + lipids provides 83gm protein/day & 1659 kcal/day - 100% goal) 2. Lipid emulsion 20% also to be cycled- will run at 20mL/hr22mm 1800-0600 3. [facility] patient not on TPN (0600-1800) 4. No multivitamin or trace elements in TPN since pt is taking a PO multivitamin 5. Continue SSI for now - will DC tomorrow if CBGs remain well controlled at full rate cyclic TPN 6. BMET, Mg and Phos in AM 7. F/u ability to take  PO diet  Salome Arnt, PharmD, BCPS Pager # [phone] 03/22/2013 8:59 AM

## 2013-03-22 NOTE — Progress Notes (Signed)
8 Days Post-Op  Subjective: She is eating some food but appetite is poor. She says she can't walk and has pain in her feet and ankles when trying to ambulate.  This has been going on since the weekend  Objective: Vital signs in last 24 hours: Temp:  [98 F (36.7 C)-98.5 F (36.9 C)] 98 F (36.7 C) (01/23 0528) Pulse Rate:  [85-98] 98 (01/23 0528) Resp:  [18] 18 (01/23 0528) BP: (114-133)/(75-83) 114/79 mmHg (01/23 0528) SpO2:  [98 %-100 %] 99 % (01/23 0528) Last BM Date: 03/20/13  Intake/Output from previous day: 01/22 0701 - 01/23 0700 In: 1844 [P.O.:120; I.V.:240; LZJ:6734] Out: 650 [Urine:650] Intake/Output this shift:    Continues to look better Lungs clear Abdomen soft, benign  Lab Results:  No results found for this basename: WBC, HGB, HCT, PLT,  in the last 72 hours BMET  Recent Labs  03/20/13 0625 03/21/13 0537  NA 141 140  K 4.0 3.9  CL 106 102  CO2 25 27  GLUCOSE 94 101*  BUN 6 8  CREATININE 0.37* 0.40*  CALCIUM 7.9* 8.5   PT/INR No results found for this basename: LABPROT, INR,  in the last 72 hours ABG No results found for this basename: PHART, PCO2, PO2, HCO3,  in the last 72 hours  Studies/Results: No results found.  Anti-infectives: Anti-infectives   Start     Dose/Rate Route Frequency Ordered Stop   03/14/13 0600  ceFAZolin (ANCEF) IVPB 2 g/50 mL premix     2 g 100 mL/hr over 30 Minutes Intravenous On call to O.R. 03/13/13 1359 03/14/13 0727      Assessment/Plan: s/p Procedure(s): LAPAROSCOPIC CHOLECYSTECTOMY WITH ATTEMPTED INTRAOPERATIVE CHOLANGIOGRAM (N/A)  Fatty liver of uncertain etiology, s/p lap chole as recommended by GI without any improvement in her symptoms She has no general surgical issues. It is difficult to tell when her leg complaints started. She needs placement. Will try and get a medical consult.  Most of her issues can be managed with outpt followup  LOS: 8 days    Guthrie Lemme A 03/22/2013

## 2013-03-22 NOTE — Progress Notes (Signed)
Physical Therapy Treatment Patient Details Name: Terri Ayala MRN: [id number] DOB: [birth year] Today's Date: 03/22/2013 Time: 7654-6503 PT Time Calculation (min): 23 min  PT Assessment / Plan / Recommendation  History of Present Illness Admitted with N/V, gallstones, liver dysfunction; s/p lap chole; of note, has been on CIWA protocol   PT Comments   Pt's unable to progress with ambulation due to bil LE pain which pt states worsens with WBing.  Per chart review, pt's insurance will not approve SNF therefore pt will need HHPT.  Pt needs to practice steps before d/cing home.     Follow Up Recommendations  Home health PT;Supervision/Assistance - 24 hour (Insurance denying SNF)     Does the patient have the potential to tolerate intense rehabilitation     Barriers to Discharge        Equipment Recommendations  Rolling walker with 5" wheels;3in1 (PT)    Recommendations for Other Services OT consult  Frequency Min 3X/week   Progress towards PT Goals    Plan Discharge plan needs to be updated    Precautions / Restrictions Precautions Precautions: Fall Restrictions Weight Bearing Restrictions: No   Pertinent Vitals/Pain 7-8/10 bil LE's/feet.      Mobility  Bed Mobility Overal bed mobility: Needs Assistance Bed Mobility: Supine to Sit Supine to sit: Supervision;HOB elevated General bed mobility comments: Incr time.  HOB elevated & use of rail but no physical (A) needed.   Transfers Overall transfer level: Needs assistance Equipment used: Rolling walker (2 wheeled) Transfers: Sit to/from Stand Sit to Stand: Min assist General transfer comment: cues for hand placement. (A) to power up to standing Ambulation/Gait Ambulation/Gait assistance: Min guard Ambulation Distance (Feet): 15 Feet Assistive device: Rolling walker (2 wheeled) Gait Pattern/deviations: Decreased step length - right;Decreased step length - left;Trunk flexed;Shuffle Gait velocity: very slow General Gait  Details: Pt with very slow shuffling steps & flexed posture.  She reports pain in LE's limiting her ability to progress.        PT Goals (current goals can now be found in the care plan section) Acute Rehab PT Goals Patient Stated Goal: get better; get strength back PT Goal Formulation: With patient Time For Goal Achievement: 04/03/13 Potential to Achieve Goals: Good  Visit Information  Last PT Received On: 03/22/13 Assistance Needed: +1 History of Present Illness: Admitted with N/V, gallstones, liver dysfunction; s/p lap chole; of note, has been on CIWA protocol    Subjective Data  Patient Stated Goal: get better; get strength back   Cognition  Cognition Arousal/Alertness: Awake/alert Behavior During Therapy: WFL for tasks assessed/performed Overall Cognitive Status: Within Functional Limits for tasks assessed    Balance     End of Session PT - End of Session Activity Tolerance: Patient limited by pain Patient left: in chair;with call bell/phone within reach;Other (comment) (OT present) Nurse Communication: Mobility status   GP     Sena Hitch 03/22/2013, 2:00 PM   Sarajane Marek, PTA [phone] 03/22/2013

## 2013-03-22 NOTE — Progress Notes (Signed)
Patient doing well functionally with therapy and not in need of intense inpt rehab at this time. SNF recommended. [phone]

## 2013-03-22 NOTE — Evaluation (Signed)
Occupational Therapy Evaluation Patient Details Name: Terri Ayala MRN: [id number] DOB: [birth year] Today's Date: 03/22/2013 Time: 4970-2637 OT Time Calculation (min): 14 min  OT Assessment / Plan / Recommendation History of present illness Admitted with N/V, gallstones, liver dysfunction; s/p lap chole; of note, has been on CIWA protocol   Clinical Impression   Pt admitted with above. Pt limited by bilateral LE (including feet) pain which worsens with WBing. Per chart review, pt's insurance will not approve SNF.  Pt will need HHOT services upon return home.  Pt states her mother will return home on Monday, at which time she will be able to have 24/7 assist at home. Will continue to follow acutely in order to address below problem list.    OT Assessment  Patient needs continued OT Services    Follow Up Recommendations  Home health OT;Supervision/Assistance - 24 hour    Barriers to Discharge      Equipment Recommendations  3 in 1 bedside comode;Tub/shower bench    Recommendations for Other Services    Frequency  Min 2X/week    Precautions / Restrictions Precautions Precautions: Fall Restrictions Weight Bearing Restrictions: No   Pertinent Vitals/Pain See vitals    ADL  Eating/Feeding: Performed;Independent Where Assessed - Eating/Feeding: Chair Lower Body Dressing: Performed;Moderate assistance Where Assessed - Lower Body Dressing: Supported sit to stand Toilet Transfer: Pharmacologist Method: Sit to Loss adjuster, chartered:  (chair) Transfers/Ambulation Related to ADLs: Min assist for sit<>Stand. Incr time due to bilateral foot pain. ADL Comments: Pt unable to don socks/shoes due to bil LE and foot pain.  Pt states she would not be able to step over tub with the pain she is experiencing.  Recommend tub bench to minimize fall risk during tub transfer.    OT Diagnosis: Acute pain;Generalized weakness  OT Problem List: Decreased  strength;Decreased activity tolerance;Decreased knowledge of use of DME or AE;Pain OT Treatment Interventions: Self-care/ADL training;DME and/or AE instruction;Therapeutic activities;Patient/family education   OT Goals(Current goals can be found in the care plan section) Acute Rehab OT Goals Patient Stated Goal: get better; get strength back OT Goal Formulation: With patient Time For Goal Achievement: 03/29/13 Potential to Achieve Goals: Good  Visit Information  Last OT Received On: 03/22/13 Assistance Needed: +1 History of Present Illness: Admitted with N/V, gallstones, liver dysfunction; s/p lap chole; of note, has been on CIWA protocol       Prior [place] expects to be discharged to:: Private residence Living Arrangements: Alone Available Help at Discharge: Family;Available PRN/intermittently Type of Home: House Home Access: Stairs to enter Entrance Stairs-Number of Steps: 1 Entrance Stairs-Rails: None Home Layout: Two level;Bed/bath upstairs;1/2 bath on main level Alternate Level Stairs-Number of Steps: flight Alternate Level Stairs-Rails: Right Home Equipment: None Prior Function Level of Independence: Independent         Vision/Perception     Cognition  Cognition Arousal/Alertness: Awake/alert Behavior During Therapy: WFL for tasks assessed/performed Overall Cognitive Status: Within Functional Limits for tasks assessed    Extremity/Trunk Assessment Upper Extremity Assessment Upper Extremity Assessment: Overall WFL for tasks assessed     Mobility Bed Mobility Overal bed mobility:  (not assessed, pt up with PT) Bed Mobility: Supine to Sit Supine to sit: Supervision;HOB elevated General bed mobility comments: Incr time.  HOB elevated & use of rail but no physical (A) needed.   Transfers Overall transfer level: Needs assistance Equipment used: Rolling walker (2 wheeled) Transfers: Sit to/from Stand Sit  to Stand: Min  assist General transfer comment: VCs for hand placement     Exercise     Balance     End of Session OT - End of Session Equipment Utilized During Treatment: Rolling walker Activity Tolerance: Patient limited by pain Patient left: in chair;with call bell/phone within reach Nurse Communication: Mobility status  GO    03/22/2013 Darrol Jump OTR/L Pager [phone] Office [phone]   Darrol Jump 03/22/2013, 2:18 PM

## 2013-03-22 NOTE — Progress Notes (Signed)
NUTRITION FOLLOW UP  Intervention:   TPN management per Pharmacy   MD consider reduction in TPN amount to help increase appetite.   Encouraged increased meal intake  Resource [name] po TID, each supplement provides 250 kcal and 9 grams of protein  Nutrition Dx:   Malnutrition related to chronic nausea as evidenced by po intake </= 75% of her needs for >/= 1 month and severe fat and muscle wasting; ongoing.   Goal:  Pt to meet >/= 90% of their estimated nutrition needs, met.   Monitor:  GI function, labs, weight trend  Assessment:   Pt admitted with abdominal pain, nausea, and vomiting. Pt admitted for lap chole which she had 1/15 but has remained very nauseated. Per MD note pt with severe nausea with elevated LFT's of unknown etiology, severe hepatic steatosis. Pt has been unable to tolerate any PO's and is starting TPN 1/20.  Per pt she has had nausea for about 1 year but it has gotten progressively worse over the last 6 months and it is when it started to effect her intake. Pt reports no appetite and has only been eating some soup once a day for at least the last month.   Pharmacy managing TPN. Refeeding labs being monitored and potassium, phosphorus, and magnesium levels being repleted.  Per MD notes 1/20 mom suspects pt is an alcoholic. Pt on MVI, thiamine, and folic acid. CIWA protocol ordered. Plan for d/c home/SNF with TPN due to intolerance to PO diet. (severe nausea of unknown etiology and severe fatty liver) Cyclic TPN: Clinimix E 3/78 with 20% lipids over 12 hours providing: 1900 ml total volume 1658 kcal 83 grams of protein   Per pt she is having nerve pain now so bad that she can't stand on her feet. Per pt she is going to go to a facility for this. She reports that wherever she goes will have to push her around in a wheelchair as she cannot walk without severe pain.  Pt states that her nausea is much better than before. She can eat but has been eating very small  amounts. She feels this is fine since she is getting IV nutrition. Explained to pt that if she goes to a facility they will most likely not take her with IV nutrition going. Encouraged pt to increase intake so that she will no longer need her IV nutrition. Pt agreed to trying [employer].  Discussed with case managers and CSW.   Height: Ht Readings from Last 1 Encounters:  03/14/13 5' 2"  (1.575 m)    Weight Status:   Wt Readings from Last 1 Encounters:  03/14/13 124 lb (56.246 kg)    Re-estimated needs:  Kcal: 1550-1700  Protein: 75-85 grams  Fluid: > 1.6 L/day  Skin: abd incision  Diet Order: General Meal Completion: 0-25%   Intake/Output Summary (Last 24 hours) at 03/22/13 0912 Last data filed at 03/22/13 0600  Gross per 24 hour  Intake   1844 ml  Output    650 ml  Net   1194 ml    Last BM: 1/18   Labs:   Recent Labs Lab 03/19/13 0515 03/20/13 0625 03/21/13 0537  NA 141 141 140  K 3.1* 4.0 3.9  CL 101 106 102  CO2 28 25 27   BUN 5* 6 8  CREATININE 0.42* 0.37* 0.40*  CALCIUM 7.1* 7.9* 8.5  MG 2.2 1.8 1.8  PHOS 4.1 3.5 4.2  GLUCOSE 105* 94 101*    CBG (last 3)  Recent Labs  03/21/13 1956 03/22/13 0205 03/22/13 0645  GLUCAP 95 120* 127*    Scheduled Meds: . sodium chloride   Intravenous Q24H  . diltiazem  240 mg Oral Daily  . enoxaparin (LOVENOX) injection  40 mg Subcutaneous Q24H  . folic acid  1 mg Oral Daily  . insulin aspart  0-9 Units Subcutaneous Custom  . multivitamin with minerals  1 tablet Oral Daily  . ondansetron  4 mg Oral TID AC & HS   Or  . ondansetron (ZOFRAN) IV  4 mg Intravenous TID AC & HS  . pantoprazole  40 mg Oral Daily  . thiamine  100 mg Oral Daily   Or  . thiamine  100 mg Intravenous Daily    Continuous Infusions: . [name] KitchenTPN (CLINIMIX-E) Adult     And  . fat emulsion 240 mL (03/21/13 1745)  . [name] KitchenTPN (CLINIMIX-E) Adult     And  . fat emulsion      Maylon Peppers RD, [place], [place] Pager [phone] After  Hours Pager

## 2013-03-22 NOTE — Care Management Note (Signed)
:    03/22/13 10:52Am Ricki Miller, RN BSN Case Manager Case Manager spoke with patient concerning discharge needs. Explained that she cannot go to a SNF with TPN, encouraged patient to try and increase her intake and work with therapy.

## 2013-03-22 NOTE — Clinical Documentation Improvement (Signed)
THIS DOCUMENT IS NOT A PERMANENT PART OF THE MEDICAL RECORD  Please update your documentation with the medical record to reflect your response to this query. If you need help knowing how to do this please call [phone].  03/22/13   Dear Dr. Harl Bowie / Associates,  In a better effort to capture your patient's severity of illness, reflect appropriate length of stay and utilization of resources, a review of the patient medical record has revealed the following indicators.    Based on your clinical judgment, please clarify and document in a progress note and/or discharge summary the clinical condition associated with the following supporting information:  In responding to this query please exercise your independent judgment.  The fact that a query is asked, does not imply that any particular answer is desired or expected.  Possible Clinical Conditions?  _______Severe Malnutrition   _______Protein Calorie Malnutrition _______Severe Protein Calorie Malnutrition _______Other Condition _______Cannot clinically determine   Supporting Information:  Signs & Symptoms:Nutrition Dx:    Malnutrition related to chronic nausea as evidenced by po intake </= 75% of her needs for >/= 1 month and severe fat and muscle wasting; ongoing.   Diagnostics: Per approved criteria -Severe malnutrition in the context of chronic illness   Treatment / Intervention:   TPN management per Pharmacy   MD consider reduction in TPN amount to help increase appetite.   Encouraged increased meal intake  Resource [name] po TID, each supplement provides 250 kcal and 9 grams of protein  Nutrition Consult by Cleotis Lema, RD at 03/22/2013  9:12 AM   You may use possible, probable, or suspect with inpatient documentation. possible, probable, suspected diagnoses MUST be documented at the time of discharge  Reviewed: additional documentation in the medical record at discharge  Thank You,  Alessandra Grout RN, BSN, CCDS, Clinical Documentation Specialist: [phone] [place]

## 2013-03-23 LAB — GLUCOSE, CAPILLARY
GLUCOSE-CAPILLARY: 108 mg/dL — AB (ref 70–99)
Glucose-Capillary: 116 mg/dL — ABNORMAL HIGH (ref 70–99)
Glucose-Capillary: 117 mg/dL — ABNORMAL HIGH (ref 70–99)
Glucose-Capillary: 94 mg/dL (ref 70–99)
Glucose-Capillary: 98 mg/dL (ref 70–99)

## 2013-03-23 LAB — MAGNESIUM: Magnesium: 2 mg/dL (ref 1.5–2.5)

## 2013-03-23 LAB — BASIC METABOLIC PANEL
BUN: 14 mg/dL (ref 6–23)
CHLORIDE: 101 meq/L (ref 96–112)
CO2: 27 mEq/L (ref 19–32)
CREATININE: 0.36 mg/dL — AB (ref 0.50–1.10)
Calcium: 9 mg/dL (ref 8.4–10.5)
GFR calc non Af Amer: >90 mL/min (ref >90)
Glucose, Bld: 104 mg/dL — ABNORMAL HIGH (ref 70–99)
POTASSIUM: 4.5 meq/L (ref 3.7–5.3)
SODIUM: 140 meq/L (ref 137–147)

## 2013-03-23 LAB — PHOSPHORUS: Phosphorus: 5.1 mg/dL — ABNORMAL HIGH (ref 2.3–4.6)

## 2013-03-23 MED ORDER — GABAPENTIN 100 MG PO CAPS
100.0000 mg | ORAL_CAPSULE | Freq: Three times a day (TID) | ORAL | Status: DC
  Administered 2013-03-23 – 2013-03-26 (×11): 100 mg via ORAL
  Filled 2013-03-23 (×13): qty 1

## 2013-03-23 MED ORDER — FAT EMULSION 20 % IV EMUL
240.0000 mL | INTRAVENOUS | Status: AC
  Administered 2013-03-23: 240 mL via INTRAVENOUS
  Filled 2013-03-23: qty 250

## 2013-03-23 MED ORDER — CLINIMIX E/DEXTROSE (5/15) 5 % IV SOLN
INTRAVENOUS | Status: AC
  Administered 2013-03-23: 18:00:00 via INTRAVENOUS
  Filled 2013-03-23: qty 1000

## 2013-03-23 NOTE — Progress Notes (Signed)
[place] NOTE  Pharmacy Consult for TPN Indication: intolerance to enteral feeding  Allergies  Allergen Reactions  . Doxycycline Other (See Comments)    Reaction unknown  . Tetracycline Hcl Other (See Comments)    Reaction unknown    Patient Measurements: Height: 5' 2"  (157.5 cm) Weight: 124 lb (56.246 kg) IBW/kg (Calculated) : 50.1  Vital Signs: Temp: 98.2 F (36.8 C) (01/24 0614) BP: 125/76 mmHg (01/24 0614) Pulse Rate: 104 (01/24 0614) Intake/Output from previous day: 01/23 0701 - 01/24 0700 In: 360 [P.O.:360] Out: 900 [Urine:900] Intake/Output from this shift: Total I/O In: 240 [I.V.:240] Out: -   Labs: No results found for this basename: WBC, HGB, HCT, PLT, APTT, INR,  in the last 72 hours   Recent Labs  03/21/13 0537 03/23/13 0500  NA 140 140  K 3.9 4.5  CL 102 101  CO2 27 27  GLUCOSE 101* 104*  BUN 8 14  CREATININE 0.40* 0.36*  CALCIUM 8.5 9.0  MG 1.8 2.0  PHOS 4.2 5.1*  PROT 5.4*  --   ALBUMIN 2.3*  --   AST 100*  --   ALT 18  --   ALKPHOS 315*  --   BILITOT 0.8  --    Estimated Creatinine Clearance: 60.6 ml/min (by C-G formula based on Cr of 0.36).    Recent Labs  03/22/13 2151 03/23/13 0522 03/23/13 0648  GLUCAP 139* 116* 117*   Insulin Requirements in the past 24 hours:  1 unit SSI; CBGs all <150  Nutritional Goals:  1550-1700 kCal, 75-85 grams of protein per day per RD 1/20  Current Nutrition:  Clinimix E 5/15 + lipid emulsion 20% on cycle   Assessment: 58 yo lady to start TPN as she does not tolerate enteral feeding per MD.  She has had severe nausea s/p lap chole, elevated LFTs etiology unclear and severe hepatic steatosis. She had no improvement in symptoms with reglan. Tolerating TPN.  Pt placed on alcohol withdrawal protocol with MVI, FA, and thiamine.  She is taking PO PPI and PO vicodin.  IVF NS is KVO'd. Plan is to DC home or to SNF/rehab with PICC and TPN.   GI: s/p cholecystectomy 1/15,  She  remains nauseated and weak, MRCP revealed severe hepatic steatosis with biliary abnormalities, last BM 1/21 - pt is taking some PO diet but minimal appetite, RD recommended reducing rate of TPN to hopefully help stimulate appetite  Lytes: Na 140, K 4.5, Mag 2, CorCa 10.36, Phos 5.1 (Ca x phos = 52.8)  Renal: SrCr 0.36, CrCl ~22m/min, UOP 0.753mkg/hr  Hepatobil: Alk phos increased again to 315, AST up to 100, ALT 18, Tbili 0.8, alb 2.3, trigs 169, prealbumin 12.1 (1/20)  TPN Access: PICC line placed 1/19 TPN day#: 6  Plan:  1. Will reduce total TPN for patient in hopes of stimulating appetite - Cycle Clinimix E 5/15 over 12 hours. From 1800-1900, TPN to run at 5037mr, then from 1900-0500 (10 hours), TPN to run at 71m12m, then from 0500-0600 TPN to run at 50mL71m 2. Lipid emulsion 20% also to be cycled- will run at 20mL/34mrom 1800-0600 3. [facility] (0600-1800) 4. No multivitamin or trace elements in TPN since pt is taking a PO multivitamin 5. DC SSI since CBGs are well controlled 6. BMET and Phos in AM - watch phos x ca closely 7. F/u ability to take PO diet  RachelSalome ArntmD, BCPS Pager #  [phone] 03/23/2013 9:38 AM

## 2013-03-23 NOTE — Progress Notes (Signed)
OT Cancellation Note  Patient Details Name: Terri Ayala MRN: [id number] DOB: Nov 26, 1955   Cancelled Treatment:    Reason Eval/Treat Not Completed: Pain limiting ability to participate                                           Pt. Reports she has just started a new "nerve" pain medicine and wants it to have a day to "kick in" before she participates in skilled ot. Secondary to reports of severe nerve pain in b les.  Janice Coffin , COTA/L 03/23/2013, 1:26 PM

## 2013-03-23 NOTE — Progress Notes (Signed)
Physical Therapy Treatment Patient Details Name: Terri Ayala MRN: [id number] DOB: 03/28/1955 Today's Date: 03/23/2013 Time: 6967-8938 PT Time Calculation (min): 16 min  PT Assessment / Plan / Recommendation  History of Present Illness Admitted with N/V, gallstones, liver dysfunction; s/p lap chole; of note, has been on CIWA protocol   PT Comments   Pt able to progress with ambulation today compared to previous PT session but she still c/o painful bil feet that worsens with WBing.  Pt will need to practice steps prior to d/c home.     Follow Up Recommendations  Home health PT;Supervision/Assistance - 24 hour     Does the patient have the potential to tolerate intense rehabilitation     Barriers to Discharge        Equipment Recommendations  Rolling walker with 5" wheels;3in1 (PT)    Recommendations for Other Services OT consult  Frequency Min 3X/week   Progress towards PT Goals Progress towards PT goals: Progressing toward goals  Plan Current plan remains appropriate    Precautions / Restrictions Precautions Precautions: Fall Restrictions Weight Bearing Restrictions: No   Pertinent Vitals/Pain 5/10 bil feet.      Mobility  Bed Mobility Overal bed mobility: Modified Independent Bed Mobility: Supine to Sit Supine to sit:  (use of rail) Transfers Overall transfer level: Needs assistance Equipment used: Rolling walker (2 wheeled) Transfers: Sit to/from Stand Sit to Stand: Min guard General transfer comment: cues for hand placement Ambulation/Gait Ambulation/Gait assistance: Supervision Ambulation Distance (Feet): 100 Feet Assistive device: Rolling walker (2 wheeled) Gait Pattern/deviations: Step-through pattern;Decreased stride length;Trunk flexed Gait velocity: very slow Gait velocity interpretation: Below normal speed for age/gender General Gait Details: Pt able to increase ambulation distance but cont's to be very slow & with small steps due to painful bil feet.         PT Goals (current goals can now be found in the care plan section) Acute Rehab PT Goals PT Goal Formulation: With patient Time For Goal Achievement: 04/03/13 Potential to Achieve Goals: Good  Visit Information  Last PT Received On: 03/23/13 Assistance Needed: +1 History of Present Illness: Admitted with N/V, gallstones, liver dysfunction; s/p lap chole; of note, has been on CIWA protocol    Subjective Data      Cognition  Cognition Arousal/Alertness: Awake/alert Behavior During Therapy: WFL for tasks assessed/performed Overall Cognitive Status: Within Functional Limits for tasks assessed    Balance     End of Session PT - End of Session Activity Tolerance: Patient tolerated treatment well;Patient limited by pain Patient left: in chair;with call bell/phone within reach Nurse Communication: Mobility status   GP     Sena Hitch 03/23/2013, 11:26 AM  Sarajane Marek, PTA [phone] 03/23/2013

## 2013-03-23 NOTE — Progress Notes (Signed)
9 Days Post-Op  Subjective: She still reports that her feet hurt to walk Appetite remains marginal  Objective: Vital signs in last 24 hours: Temp:  [97.8 F (36.6 C)-98.2 F (36.8 C)] 98.2 F (36.8 C) (01/24 0614) Pulse Rate:  [102-106] 104 (01/24 0614) Resp:  [18] 18 (01/24 0614) BP: (85-125)/(53-76) 125/76 mmHg (01/24 0614) SpO2:  [99 %-100 %] 99 % (01/24 0614) Last BM Date: 03/20/13  Intake/Output from previous day: 01/23 0701 - 01/24 0700 In: 360 [P.O.:360] Out: 900 [Urine:900] Intake/Output this shift: Total I/O In: 260 [I.V.:260] Out: -   Exam is normal Her feet are normal in appearance with good pulses and neurologic exam is normal  Lab Results:  No results found for this basename: WBC, HGB, HCT, PLT,  in the last 72 hours BMET  Recent Labs  03/21/13 0537 03/23/13 0500  NA 140 140  K 3.9 4.5  CL 102 101  CO2 27 27  GLUCOSE 101* 104*  BUN 8 14  CREATININE 0.40* 0.36*  CALCIUM 8.5 9.0   PT/INR No results found for this basename: LABPROT, INR,  in the last 72 hours ABG No results found for this basename: PHART, PCO2, PO2, HCO3,  in the last 72 hours  Studies/Results: No results found.  Anti-infectives: Anti-infectives   Start     Dose/Rate Route Frequency Ordered Stop   03/14/13 0600  ceFAZolin (ANCEF) IVPB 2 g/50 mL premix     2 g 100 mL/hr over 30 Minutes Intravenous On call to O.R. 03/13/13 1359 03/14/13 0727      Assessment/Plan: s/p Procedure(s): LAPAROSCOPIC CHOLECYSTECTOMY WITH ATTEMPTED INTRAOPERATIVE CHOLANGIOGRAM (N/A)  Again, her problems remain purely medical Her insurance won't approve SNF Will work on discharge Monday off TNA Will need medical/GI workup as an outpt Nothing at all to offer from surgical standpoint  LOS: 9 days    Keandre Linden A 03/23/2013

## 2013-03-24 LAB — GLUCOSE, CAPILLARY
Glucose-Capillary: 87 mg/dL (ref 70–99)
Glucose-Capillary: 84 mg/dL (ref 70–99)
Glucose-Capillary: 87 mg/dL (ref 70–99)

## 2013-03-24 LAB — BASIC METABOLIC PANEL
BUN: 10 mg/dL (ref 6–23)
CO2: 26 meq/L (ref 19–32)
Calcium: 8.8 mg/dL (ref 8.4–10.5)
Chloride: 104 mEq/L (ref 96–112)
Creatinine, Ser: 0.39 mg/dL — ABNORMAL LOW (ref 0.50–1.10)
GFR calc Af Amer: >90 mL/min (ref >90)
GFR calc non Af Amer: >90 mL/min (ref >90)
Glucose, Bld: 105 mg/dL — ABNORMAL HIGH (ref 70–99)
Potassium: 4.3 mEq/L (ref 3.7–5.3)
Sodium: 141 mEq/L (ref 137–147)

## 2013-03-24 LAB — PHOSPHORUS: PHOSPHORUS: 4.7 mg/dL — AB (ref 2.3–4.6)

## 2013-03-24 MED ORDER — CLINIMIX E/DEXTROSE (5/15) 5 % IV SOLN
INTRAVENOUS | Status: AC
  Administered 2013-03-24: 18:00:00 via INTRAVENOUS
  Filled 2013-03-24: qty 1000

## 2013-03-24 MED ORDER — FAT EMULSION 20 % IV EMUL
240.0000 mL | INTRAVENOUS | Status: AC
Start: 2013-03-24 — End: 2013-03-25
  Administered 2013-03-24: 240 mL via INTRAVENOUS
  Filled 2013-03-24: qty 250

## 2013-03-24 NOTE — Progress Notes (Signed)
10 Days Post-Op  Subjective: Her only complaint is burning in her feet keeping her from ambulating She is tolerating liquids and boost shakes Has bloating with regular food   Objective: Vital signs in last 24 hours: Temp:  [97.5 F (36.4 C)-98.8 F (37.1 C)] 98.8 F (37.1 C) (01/25 0545) Pulse Rate:  [93-103] 103 (01/25 0545) Resp:  [16-19] 16 (01/25 0545) BP: (103-121)/(62-74) 118/68 mmHg (01/25 0545) SpO2:  [99 %] 99 % (01/25 0545) Last BM Date: 03/20/13  Intake/Output from previous day: 01/24 0701 - 01/25 0700 In: 5060.4 [P.O.:240; I.V.:260; TPN:4560.4] Out: 300 [Urine:300] Intake/Output this shift: Total I/O In: 4560.4 [TPN:4560.4] Out: 300 [Urine:300]  Lungs clear Abdomen soft, non tender Ext with no tenderness or edema  Lab Results:  No results found for this basename: WBC, HGB, HCT, PLT,  in the last 72 hours BMET  Recent Labs  03/23/13 0500  NA 140  K 4.5  CL 101  CO2 27  GLUCOSE 104*  BUN 14  CREATININE 0.36*  CALCIUM 9.0   PT/INR No results found for this basename: LABPROT, INR,  in the last 72 hours ABG No results found for this basename: PHART, PCO2, PO2, HCO3,  in the last 72 hours  Studies/Results: No results found.  Anti-infectives: Anti-infectives   Start     Dose/Rate Route Frequency Ordered Stop   03/14/13 0600  ceFAZolin (ANCEF) IVPB 2 g/50 mL premix     2 g 100 mL/hr over 30 Minutes Intravenous On call to O.R. 03/13/13 1359 03/14/13 0727      Assessment/Plan: s/p Procedure(s): LAPAROSCOPIC CHOLECYSTECTOMY WITH ATTEMPTED INTRAOPERATIVE CHOLANGIOGRAM (N/A)  Have placed her on neurontin Cause of her symptoms is uncertain Home soon with home health.  Will need further medical workup as an outpt  LOS: 10 days    Terri Ayala A 03/24/2013

## 2013-03-24 NOTE — Progress Notes (Signed)
[place] NOTE  Pharmacy Consult for TPN Indication: intolerance to enteral feeding  Allergies  Allergen Reactions  . Doxycycline Other (See Comments)    Reaction unknown  . Tetracycline Hcl Other (See Comments)    Reaction unknown    Patient Measurements: Height: _0  (157.5 cm) Weight: 124 lb (56.246 kg) IBW/kg (Calculated) : 50.1  Vital Signs: Temp: 98.8 F (37.1 C) (01/25 0545) Temp src: Oral (01/25 0545) BP: 118/68 mmHg (01/25 0545) Pulse Rate: 103 (01/25 0545) Intake/Output from previous day: 01/24 0701 - 01/25 0700 In: 5060.4 [P.O.:240; I.V.:260; TPN:4560.4] Out: 300 [Urine:300] Intake/Output from this shift:    Labs: No results found for this basename: WBC, HGB, HCT, PLT, APTT, INR,  in the last 72 hours   Recent Labs  03/23/13 0500 03/24/13 0500  NA 140 141  K 4.5 4.3  CL 101 104  CO2 27 26  GLUCOSE 104* 105*  BUN 14 10  CREATININE 0.36* 0.39*  CALCIUM 9.0 8.8  MG 2.0  --   PHOS 5.1* 4.7*   Estimated Creatinine Clearance: 60.6 ml/min (by C-G formula based on Cr of 0.39).    Recent Labs  03/23/13 1637 03/23/13 2045 03/24/13 0624  GLUCAP 108* 98 87   Insulin Requirements in the past 24 hours:  None - SSI dc'd  Nutritional Goals:  1550-1700 kCal, 75-85 grams of protein per day per RD 1/20  Current Nutrition:  Clinimix E 5/15 + lipid emulsion 20% on cycle   Assessment: 58 yo lady to start TPN as she does not tolerate enteral feeding per MD.  She has had severe nausea s/p lap chole, elevated LFTs etiology unclear and severe hepatic steatosis. She had no improvement in symptoms with reglan. Tolerating TPN.  Pt placed on alcohol withdrawal protocol with MVI, FA, and thiamine.  She is taking PO PPI and PO vicodin.  IVF NS is KVO'd.   GI: s/p cholecystectomy 1/15,  She remains nauseated and weak, MRCP revealed severe hepatic steatosis with biliary abnormalities, last BM 1/21 - pt is taking some PO diet but minimal appetite,  TPN was reduced 1/24 in hopes of stimulating appetite  Lytes: Na 141, K 4.3, Mag 2, CorCa 10.16, Phos 4.7 (Ca x phos = 47.8)  Renal: SrCr 0.39, CrCl ~53m/min, UOP 0.269mkg/hr  Hepatobil: Alk phos increased again to 315, AST up to 100, ALT 18, Tbili 0.8, alb 2.3, trigs 169, prealbumin 12.1 (1/20)  TPN Access: PICC line placed 1/19 TPN day#: 7  Plan:  1. Continue reduced rate TPN for patient in hopes of stimulating appetite - Cycle Clinimix E 5/15 over 12 hours. From 1800-1900, TPN to run at 5054mr, then from 1900-0500 (10 hours), TPN to run at 72m11m, then from 0500-0600 TPN to run at 50mL54m 2. Lipid emulsion 20% also to be cycled- will run at 20mL/44mrom 1800-0600 3. [facility] not on TPN (0600-1800) 4. No multivitamin or trace elements in TPN since pt is taking a PO multivitamin 5. F/u AM TPN labs 6. F/u ability to take PO diet and TPN plan at DC - if planning to DC on TPN, will need to resume goal rate for full nutrition support  RachelSalome ArntmD, BCPS Pager # [phone] 8:08 AM

## 2013-03-24 NOTE — Progress Notes (Signed)
Reviewed and agree with amendment to DC plan; Roney Marion, [place]

## 2013-03-25 ENCOUNTER — Encounter (INDEPENDENT_AMBULATORY_CARE_PROVIDER_SITE_OTHER): Payer: Federal, State, Local not specified - PPO | Admitting: Surgery

## 2013-03-25 LAB — DIFFERENTIAL
BASOS ABS: 0 10*3/uL (ref 0.0–0.1)
BASOS PCT: 0 % (ref 0–1)
EOS ABS: 0.1 10*3/uL (ref 0.0–0.7)
Eosinophils Relative: 1 % (ref 0–5)
Lymphocytes Relative: 9 % — ABNORMAL LOW (ref 12–46)
Lymphs Abs: 1.2 10*3/uL (ref 0.7–4.0)
Monocytes Absolute: 1.2 10*3/uL — ABNORMAL HIGH (ref 0.1–1.0)
Monocytes Relative: 9 % (ref 3–12)
NEUTROS ABS: 10.5 10*3/uL — AB (ref 1.7–7.7)
NEUTROS PCT: 81 % — AB (ref 43–77)

## 2013-03-25 LAB — MAGNESIUM: MAGNESIUM: 1.9 mg/dL (ref 1.5–2.5)

## 2013-03-25 LAB — COMPREHENSIVE METABOLIC PANEL
ALT: 29 U/L (ref 0–35)
AST: 252 U/L — ABNORMAL HIGH (ref 0–37)
Albumin: 2.6 g/dL — ABNORMAL LOW (ref 3.5–5.2)
Alkaline Phosphatase: 278 U/L — ABNORMAL HIGH (ref 39–117)
BILIRUBIN TOTAL: 0.6 mg/dL (ref 0.3–1.2)
BUN: 10 mg/dL (ref 6–23)
CHLORIDE: 101 meq/L (ref 96–112)
CO2: 25 meq/L (ref 19–32)
CREATININE: 0.38 mg/dL — AB (ref 0.50–1.10)
Calcium: 8.9 mg/dL (ref 8.4–10.5)
GLUCOSE: 105 mg/dL — AB (ref 70–99)
Potassium: 4.1 mEq/L (ref 3.7–5.3)
Sodium: 141 mEq/L (ref 137–147)
Total Protein: 6.4 g/dL (ref 6.0–8.3)

## 2013-03-25 LAB — CBC
HCT: 27.9 % — ABNORMAL LOW (ref 36.0–46.0)
Hemoglobin: 9.4 g/dL — ABNORMAL LOW (ref 12.0–15.0)
MCH: 34.3 pg — ABNORMAL HIGH (ref 26.0–34.0)
MCHC: 33.7 g/dL (ref 30.0–36.0)
MCV: 101.8 fL — ABNORMAL HIGH (ref 78.0–100.0)
PLATELETS: 297 10*3/uL (ref 150–400)
RBC: 2.74 MIL/uL — ABNORMAL LOW (ref 3.87–5.11)
RDW: 14.4 % (ref 11.5–15.5)
WBC: 13 10*3/uL — ABNORMAL HIGH (ref 4.0–10.5)

## 2013-03-25 LAB — TRIGLYCERIDES: Triglycerides: 189 mg/dL — ABNORMAL HIGH (ref <150)

## 2013-03-25 LAB — PHOSPHORUS: PHOSPHORUS: 4.6 mg/dL (ref 2.3–4.6)

## 2013-03-25 LAB — PREALBUMIN: Prealbumin: 19 mg/dL (ref 17.0–34.0)

## 2013-03-25 MED ORDER — CLINIMIX E/DEXTROSE (5/15) 5 % IV SOLN
INTRAVENOUS | Status: AC
  Administered 2013-03-25: 17:00:00 via INTRAVENOUS
  Filled 2013-03-25: qty 1000

## 2013-03-25 MED ORDER — FAT EMULSION 20 % IV EMUL
240.0000 mL | INTRAVENOUS | Status: AC
  Administered 2013-03-25: 240 mL via INTRAVENOUS
  Filled 2013-03-25: qty 250

## 2013-03-25 NOTE — Progress Notes (Signed)
NUTRITION FOLLOW UP  Intervention:   TPN management per Pharmacy   Encouraged increased meal intake  Resource [name] po TID, each supplement provides 250 kcal and 9 grams of protein  Nutrition Dx:   Malnutrition related to chronic nausea as evidenced by po intake </= 75% of her needs for >/= 1 month and severe fat and muscle wasting; ongoing.   Goal:  Pt to meet >/= 90% of their estimated nutrition needs, met.   Monitor:  GI function, labs, weight trend  Assessment:   Pt admitted with abdominal pain, nausea, and vomiting. Pt admitted for lap chole which she had 1/15 but has remained very nauseated. Per MD note pt with severe nausea with elevated LFT's of unknown etiology, severe hepatic steatosis. Pt has been unable to tolerate any PO's and is starting TPN 1/20.  Per pt she has had nausea for about 1 year but it has gotten progressively worse over the last 6 months and it is when it started to effect her intake. Pt reports no appetite and has only been eating some soup once a day for at least the last month. Per MD notes 1/20 mom suspects pt is an alcoholic. Pt on MVI, thiamine, and folic acid. CIWA protocol ordered.  Pt has been on a Regular diet since 1/21, however she has only been ordering clear liquids. Pt states that solids make her feel bloated. Discussed importance of advancing her diet as she will be going home without nutrition support. Ordered mashed potatoes for pt. Encouraged pt to order some additional solids to try at dinner.  Pt states that she can drink the Resource [name] supplements.  Plan for d/c home without TPN.  Cyclic TPN: Clinimix E 6/96 with 20% lipids over 12 hours providing: 1200 ml total volume 1161 kcal 48 grams of protein    Height: Ht Readings from Last 1 Encounters:  03/14/13 5' 2"  (1.575 m)    Weight Status:   Wt Readings from Last 1 Encounters:  03/14/13 124 lb (56.246 kg)  No new weight  Re-estimated needs:  Kcal: 1550-1700  Protein:  75-85 grams  Fluid: > 1.6 L/day  Skin: abd incision  Diet Order: General Meal Completion: 50% - clear liquids   Intake/Output Summary (Last 24 hours) at 03/25/13 1217 Last data filed at 03/25/13 0100  Gross per 24 hour  Intake 3259.4 ml  Output      0 ml  Net 3259.4 ml    Last BM: 1/26   Labs:   Recent Labs Lab 03/21/13 0537 03/23/13 0500 03/24/13 0500 03/25/13 0500  NA 140 140 141 141  K 3.9 4.5 4.3 4.1  CL 102 101 104 101  CO2 27 27 26 25   BUN 8 14 10 10   CREATININE 0.40* 0.36* 0.39* 0.38*  CALCIUM 8.5 9.0 8.8 8.9  MG 1.8 2.0  --  1.9  PHOS 4.2 5.1* 4.7* 4.6  GLUCOSE 101* 104* 105* 105*    CBG (last 3)   Recent Labs  03/24/13 0624 03/24/13 1150 03/24/13 1648  GLUCAP 87 84 87    Scheduled Meds: . sodium chloride   Intravenous Q24H  . diltiazem  240 mg Oral Daily  . enoxaparin (LOVENOX) injection  40 mg Subcutaneous Q24H  . feeding supplement (RESOURCE [name])  1 Container Oral TID BM  . folic acid  1 mg Oral Daily  . gabapentin  100 mg Oral TID  . multivitamin with minerals  1 tablet Oral Daily  . ondansetron  4 mg  Oral TID AC & HS   Or  . ondansetron (ZOFRAN) IV  4 mg Intravenous TID AC & HS  . pantoprazole  40 mg Oral Daily  . thiamine  100 mg Oral Daily   Or  . thiamine  100 mg Intravenous Daily    Continuous Infusions: . [name] KitchenTPN (CLINIMIX-E) Adult     And  . fat emulsion      Maylon Peppers RD, [place], [place] Pager [phone] After Hours Pager

## 2013-03-25 NOTE — Progress Notes (Addendum)
11 Days Post-Op  Subjective: She continues to slowly improve Still with feet discomfort Still reports difficulty with ambulation secondary to feet pain Chronic nausea improved slightly  Objective: Vital signs in last 24 hours: Temp:  [98 F (36.7 C)-98.7 F (37.1 C)] 98.2 F (36.8 C) (01/26 0458) Pulse Rate:  [84-102] 97 (01/26 0458) Resp:  [17-19] 19 (01/26 0458) BP: (111-131)/(64-82) 119/70 mmHg (01/26 0458) SpO2:  [97 %-100 %] 97 % (01/26 0458) Last BM Date: 03/20/13  Intake/Output from previous day: 01/25 0701 - 01/26 0700 In: 3819.4 [P.O.:800; TPN:3019.4] Out: -  Intake/Output this shift:    Well in appearance Mild RUQ abd tenderness  Lab Results:   Recent Labs  03/25/13 0500  WBC 13.0*  HGB 9.4*  HCT 27.9*  PLT 297   BMET  Recent Labs  03/23/13 0500 03/24/13 0500  NA 140 141  K 4.5 4.3  CL 101 104  CO2 27 26  GLUCOSE 104* 105*  BUN 14 10  CREATININE 0.36* 0.39*  CALCIUM 9.0 8.8   PT/INR No results found for this basename: LABPROT, INR,  in the last 72 hours ABG No results found for this basename: PHART, PCO2, PO2, HCO3,  in the last 72 hours  Studies/Results: No results found.  Anti-infectives: Anti-infectives   Start     Dose/Rate Route Frequency Ordered Stop   03/14/13 0600  ceFAZolin (ANCEF) IVPB 2 g/50 mL premix     2 g 100 mL/hr over 30 Minutes Intravenous On call to O.R. 03/13/13 1359 03/14/13 0727      Assessment/Plan: s/p Procedure(s): LAPAROSCOPIC CHOLECYSTECTOMY WITH ATTEMPTED INTRAOPERATIVE CHOLANGIOGRAM (N/A)  Will start making arrangements for discharge home, hopefully tomorrow Do not plan on sending home on TNA Will need further workup by GI and her Primary care physician after discharge No general surgical issues   LOS: 11 days    Ninel Abdella A 03/25/2013

## 2013-03-25 NOTE — Progress Notes (Signed)
Physical Therapy Treatment Patient Details Name: Terri Ayala MRN: [id number] DOB: [birth year] Today's Date: 03/25/2013 Time: 2355-7322 PT Time Calculation (min): 27 min  PT Assessment / Plan / Recommendation  History of Present Illness Admitted with N/V, gallstones, liver dysfunction; s/p lap chole; of note, has been on CIWA protocol   PT Comments   Pt progressing with mobility.  Presents at supervision level for OOB activity.  Reports pain in bil feet still present but improving.     Follow Up Recommendations  Home health PT;Supervision/Assistance - 24 hour     Does the patient have the potential to tolerate intense rehabilitation     Barriers to Discharge        Equipment Recommendations  Rolling walker with 5" wheels;3in1 (PT)    Recommendations for Other Services OT consult  Frequency Min 3X/week   Progress towards PT Goals Progress towards PT goals: Progressing toward goals  Plan Current plan remains appropriate    Precautions / Restrictions Precautions Precautions: Fall Restrictions Weight Bearing Restrictions: No       Mobility  Bed Mobility Overal bed mobility: Modified Independent Bed Mobility: Supine to Sit General bed mobility comments: Increased time, no rail use, vc's for safety and hand positioning. Transfers Overall transfer level: Needs assistance Equipment used: Rolling walker (2 wheeled) Transfers: Sit to/from Stand Sit to Stand: Supervision General transfer comment: cues for hand placement Ambulation/Gait Ambulation/Gait assistance: Supervision Ambulation Distance (Feet): 200 Feet Assistive device: Rolling walker (2 wheeled) Gait Pattern/deviations: Step-through pattern;Decreased stride length;Trunk flexed;Shuffle Gait velocity interpretation: Below normal speed for age/gender General Gait Details: Supervision for safety as pt states she feels slightly lightheaded but declines seated rest break.  cues for posture & increased step/stride length.    Stairs: Yes Stairs assistance: Min guard Stair Management: One rail Right;Forwards;Step to pattern Number of Stairs: 8 General stair comments: guarding for safety.  Pt used bil UE's on 1 rail for support.        PT Goals (current goals can now be found in the care plan section) Acute Rehab PT Goals PT Goal Formulation: With patient Time For Goal Achievement: 04/03/13 Potential to Achieve Goals: Good  Visit Information  Last PT Received On: 03/25/13 Assistance Needed: +1 History of Present Illness: Admitted with N/V, gallstones, liver dysfunction; s/p lap chole; of note, has been on CIWA protocol    Subjective Data      Cognition  Cognition Arousal/Alertness: Awake/alert Behavior During Therapy: WFL for tasks assessed/performed Overall Cognitive Status: Within Functional Limits for tasks assessed    Balance     End of Session PT - End of Session Activity Tolerance: Patient tolerated treatment well Patient left: with call bell/phone within reach (in bathroom at sink; Nsing made aware ) Nurse Communication: Mobility status;Other (comment) (pt left in bathroom to bathe herself)   GP     Sena Hitch 03/25/2013, 11:35 AM   Sarajane Marek, PTA [phone] 03/25/2013

## 2013-03-25 NOTE — Progress Notes (Signed)
Occupational Therapy Treatment Patient Details Name: Terri Ayala MRN: [id number] DOB: [birth year] Today's Date: 03/25/2013 Time: 2542-7062 OT Time Calculation (min): 30 min  OT Assessment / Plan / Recommendation  History of present illness Admitted with N/V, gallstones, liver dysfunction; s/p lap chole; of note, has been on CIWA protocol   OT comments  Pt progressing toward OT goals and ADL retraining. Pt cont to be at supervision/min guard assist level for ADL's and functional transfers, safety. She will benefit from 3:1 and tub bench at home for safety as well as a/e PRN.   Follow Up Recommendations  Home health OT;Supervision/Assistance - 24 hour    Barriers to Discharge       Equipment Recommendations  3 in 1 bedside comode;Tub/shower bench;Other (comment) (A/E PRN)    Recommendations for Other Services    Frequency Min 2X/week   Progress towards OT Goals Progress towards OT goals: Progressing toward goals  Plan Discharge plan remains appropriate    Precautions / Restrictions Precautions Precautions: Fall   Pertinent Vitals/Pain "Weakness", no c/o pain.    ADL  Eating/Feeding: Performed;Independent Where Assessed - Eating/Feeding: Bed level Grooming: Performed;Wash/dry hands;Wash/dry face;Supervision/safety Where Assessed - Grooming: Supported standing Upper Body Bathing: Simulated;Supervision/safety Where Assessed - Upper Body Bathing: Unsupported sitting (Sitting on 3:1 at sink) Upper Body Dressing: Performed;Set up;Supervision/safety;Min guard (Sitting EOB to don gown, min guard to button over IV line) Where Assessed - Upper Body Dressing: Unsupported sitting Lower Body Dressing: Performed;Supervision/safety;Min guard;Set up Where Assessed - Lower Body Dressing: Supported sit to stand;Supported sitting Toilet Transfer: Performed;Supervision/safety;Min Psychiatric nurse Method: Sit to Loss adjuster, chartered: Comfort height toilet;Grab bars;Other  (comment) (VC's for RW and hand placement) Toileting - Clothing Manipulation and Hygiene: Performed;Supervision/safety Where Assessed - Toileting Clothing Manipulation and Hygiene: Sit to stand from 3-in-1 or toilet;Standing Tub/Shower Transfer Method: Not assessed Transfers/Ambulation Related to ADLs: Sin guard-supervision assist for sit to stand given increased time secondary to foot pain. No physical assist but vc's for positioning, hand placement and safety w/ RW use. ADL Comments: Pt participated in ADL retraining session today for toileting, grooming standing at sink and LB dressing tasks w/ & w/o  A/E seated (sit to stand) at sink level. Pt will benefit from A/E PRN and HHOT for home/safety recommendations. Handout issued and reviewed for energy conservation techniques. Cont to recommend 3:1 & tub bench.    OT Diagnosis:    OT Problem List:   OT Treatment Interventions:     OT Goals(current goals can now be found in the care plan section)    Visit Information  Last OT Received On: 03/25/13 Assistance Needed: +1 History of Present Illness: Admitted with N/V, gallstones, liver dysfunction; s/p lap chole; of note, has been on CIWA protocol    Subjective Data      Prior Functioning       Cognition  Cognition Arousal/Alertness: Awake/alert Behavior During Therapy: WFL for tasks assessed/performed Overall Cognitive Status: Within Functional Limits for tasks assessed    Mobility  Bed Mobility Overal bed mobility: Modified Independent Bed Mobility: Supine to Sit General bed mobility comments: Increased time, no rail use, vc's for safety and hand positioning. Transfers Overall transfer level: Needs assistance Equipment used: Rolling walker (2 wheeled) Transfers: Sit to/from Stand Sit to Stand: Supervision;Min guard General transfer comment: cues for hand placement          End of Session OT - End of Session Equipment Utilized During Treatment: Rolling walker Activity  Tolerance: Patient tolerated treatment  well Patient left: in chair;with call bell/phone within reach  GO     Almyra Deforest 03/25/2013, 8:49 AM

## 2013-03-25 NOTE — Progress Notes (Signed)
[facility] NOTE  Pharmacy Consult for TPN Indication: intolerance to enteral feeding  Allergies  Allergen Reactions  . Doxycycline Other (See Comments)    Reaction unknown  . Tetracycline Hcl Other (See Comments)    Reaction unknown    Patient Measurements: Height: 5' 2"  (157.5 cm) Weight: 124 lb (56.246 kg) IBW/kg (Calculated) : 50.1  Vital Signs: Temp: 98.2 F (36.8 C) (01/26 0458) BP: 119/70 mmHg (01/26 0458) Pulse Rate: 97 (01/26 0458) Intake/Output from previous day: 01/25 0701 - 01/26 0700 In: 3819.4 [P.O.:800; TPN:3019.4] Out: -  Intake/Output from this shift:    Labs:  Recent Labs  03/25/13 0500  WBC 13.0*  HGB 9.4*  HCT 27.9*  PLT 297     Recent Labs  03/23/13 0500 03/24/13 0500 03/25/13 0500  NA 140 141 141  K 4.5 4.3 4.1  CL 101 104 101  CO2 27 26 25   GLUCOSE 104* 105* 105*  BUN 14 10 10   CREATININE 0.36* 0.39* 0.38*  CALCIUM 9.0 8.8 8.9  MG 2.0  --  1.9  PHOS 5.1* 4.7* 4.6  PROT  --   --  6.4  ALBUMIN  --   --  2.6*  AST  --   --  252*  ALT  --   --  29  ALKPHOS  --   --  278*  BILITOT  --   --  0.6  TRIG  --   --  189*   Estimated Creatinine Clearance: 60.6 ml/min (by C-G formula based on Cr of 0.38).    Recent Labs  03/24/13 0624 03/24/13 1150 03/24/13 1648  GLUCAP 87 84 87   Insulin Requirements in the past 24 hours:  None - SSI dc'd  Nutritional Goals:  1550-1700 kCal, 75-85 grams of protein per day per RD 1/20  Current Nutrition:  Clinimix E 5/15 + lipid emulsion 20% on cycle   Assessment: 58 yo lady to start TPN as she does not tolerate enteral feeding per MD.  She has had severe nausea s/p lap chole, elevated LFTs etiology unclear and severe hepatic steatosis. She had no improvement in symptoms with reglan. Tolerating TPN.  Pt placed on alcohol withdrawal protocol with MVI, FA, and thiamine.  She is taking PO PPI and PO vicodin.  IVF NS is KVO'd.   GI: s/p cholecystectomy 1/15,  She remains  nauseated and weak, MRCP revealed severe hepatic steatosis with biliary abnormalities, last BM 1/21 - pt is taking 50% PO diet, TPN remains at reduced rate. Noted no plans for home TPN.  Lytes: WNL  Renal: SrCr stable, CrCl ~18m/min  Hepatobil: Alk phos 278 trending down, AST up to 252, trigs 189, prealbumin 12.1 (1/20)  TPN Access: PICC line placed 1/19 TPN day#: 8  Plan:  1. Continue reduced rate TPN for patient in hopes of stimulating appetite - Cycle Clinimix E 5/15 over 12 hours. From 1800-1900, TPN to run at 565mhr, then from 1900-0500 (10 hours), TPN to run at 8653mr, then from 0500-0600 TPN to run at 60m68m  2. Lipid emulsion 20% also to be cycled- will run at 20mL73mfrom 1800-0600 3. [facility] when patient not on TPN (0600-1800) 4. No multivitamin or trace elements in TPN since pt is taking a PO multivitamin 5. F/u  pre albumin 6. F/u  PO intake and ability to DC TPSealyrmD, BCPS Clinical pharmacist, pager [phone]/2015 9:04 AM

## 2013-03-26 MED ORDER — TRAMADOL HCL 50 MG PO TABS: 50.0000 mg | ORAL_TABLET | Freq: Four times a day (QID) | ORAL | Status: DC | PRN

## 2013-03-26 MED ORDER — GABAPENTIN 100 MG PO CAPS: 100.0000 mg | ORAL_CAPSULE | Freq: Three times a day (TID) | ORAL | Status: DC

## 2013-03-26 MED ORDER — ONDANSETRON HCL 4 MG PO TABS: 4.0000 mg | ORAL_TABLET | Freq: Three times a day (TID) | ORAL | Status: DC

## 2013-03-26 NOTE — Progress Notes (Signed)
Patient ID: Terri Ayala, female   DOB: Jan 13, 1956, 58 y.o.   MRN: [id number]  She has improved and is ready for discharge with home health

## 2013-03-26 NOTE — Care Management Note (Signed)
CARE MANAGEMENT NOTE 03/26/2013  Patient:  Terri Ayala, Terri Ayala   Account Number:  [id number]  Date Initiated:  03/18/2013  Documentation initiated by:  [facility]  Subjective/Objective Assessment:   admitted s/p lap chole     Action/Plan:   Home with Home health. Choice was offered.   Anticipated DC Date:  03/26/2013   Anticipated DC Plan:  [facility]  CM consult      [place]   Choice offered to / List presented to:  C-1 Patient   DME arranged  [place]  3-N-1      DME agency  [place] arranged  HH-1 RN  [place].   Status of service:  Completed, signed off Medicare Important Message given?   (If response is "NO", the following Medicare IM given date fields will be blank) Date Medicare IM given:   Date Additional Medicare IM given:    Discharge Disposition:  [place]

## 2013-03-26 NOTE — Discharge Summary (Signed)
Physician Discharge Summary  Patient ID: Terri Ayala MRN: [id number] DOB/AGE: February 10, 1956 58 y.o.  Admit date: 03/14/2013 Discharge date: 03/26/2013  Admission Diagnoses:  Discharge Diagnoses:  Active Problems:   Chronic cholecystitis   Nonspecific elevation of levels of transaminase or lactic acid dehydrogenase (LDH)   Hepatic steatosis   Nausea with vomiting   Protein-calorie malnutrition, severe deconditioning Neuropathic lower extremity pain  Discharged Condition: fair  Hospital Course: This is a patient with severe chronic nausea and elevated liver function test. I was consult by gastroenterology to consider laparoscopic cholecystectomy. She had severe nausea and vomiting preoperatively. She was found the time of surgery to have a normal-appearing gallbladder which was removed. She also extensively fatty liver. She was malnourished preoperatively. This was quite severe. Postoperatively, her symptoms remain unchanged. I doubly color of the source of her problems. Because of her malnutrition, pecans inserted and she was started on TNA. Gastroenterology was reconsulted. They suggested further workup as an outpatient potentially a [facility].  She developed nonspecific neuropathic pain in her feet after the admission. PT and OT as well as nutrition was consult with pharmacy. After a week, she slowly improved with decrease in her nausea, improvement in her malnutrition, and improvements in her neuropathic discomfort. Admission to rehabilitation and skilled nursing facility his were denied. Again, she had no general surgical issues throughout her hospitalization. I curb sided neurology who suggested further workup as an outpatient with no inpatient needs. She became ambulatory. with the help of PT and OT and her nausea improved enough to be discharged home.  Arrangements were made for her to follow up with her primary care physician and gastroenterology. She noted needs no surgical  followup  Consults: GI  Significant Diagnostic Studies:   Treatments: TPN and surgery: lap chole  Discharge Exam: Blood pressure 129/68, pulse 86, temperature 98.3 F (36.8 C), temperature source Oral, resp. rate 18, height 5\' 2"  (1.575 m), weight 124 lb (56.246 kg), SpO2 99.00%. General appearance: alert and no distress Resp: clear to auscultation bilaterally GI: soft, non-tender; bowel sounds normal; no masses,  no organomegaly and except an enlarged liver Incision/Wound:healed  Disposition: 01-Home or Self Care with home health for PT/OT and assistence  Discharge Orders   Future Orders Complete By Expires   Ambulatory referral to [facility]  As directed    Comments:     Please evaluate Terri Ayala for admission to [facility].  Disciplines requested: Physical Therapy, occupational therapy, 24 hour home assistance  Services to provide: Other: OT  Physician to follow patient's care (the person listed here will be responsible for signing ongoing orders): PCP  Requested Start of Care Date: Today (Please call ahead to confirm availability [phone])  Special Instructions:  Also needs 24 hour assistance, walker, bedside toilet   PICC line removal  As directed        Medication List         alendronate 70 MG tablet  Commonly known as:  FOSAMAX  Take 1 tablet (70 mg total) by mouth every 7 (seven) days. Take with a full glass of water on an empty stomach.     ALPRAZolam 0.5 MG tablet  Commonly known as:  XANAX  Take 1 tablet (0.5 mg total) by mouth daily as needed. For anxiety     aspirin 81 MG tablet  Take 81 mg by mouth daily.     calcium carbonate 200 MG capsule  Take 200 mg by mouth daily.     cholecalciferol  1000 UNITS tablet  Commonly known as:  VITAMIN D  Take 1,000 Units by mouth daily.     diltiazem 240 MG 24 hr capsule  Commonly known as:  CARDIZEM CD  Take 1 capsule (240 mg total) by mouth daily.     gabapentin 100 MG capsule  Commonly known  as:  NEURONTIN  Take 1 capsule (100 mg total) by mouth 3 (three) times daily.     glucosamine-chondroitin 500-400 MG tablet  Take 1 tablet by mouth daily.     Iron 325 (65 FE) MG Tabs  Take by mouth.     metoCLOPramide 10 MG tablet  Commonly known as:  REGLAN  Take 10 mg by mouth 3 (three) times daily before meals.     norethindrone-ethinyl estradiol 1-5 MG-MCG Tabs  Commonly known as:  FEMHRT 1/5  Take 1 tablet by mouth daily.     ondansetron 8 MG tablet  Commonly known as:  ZOFRAN  Take 8 mg by mouth every 8 (eight) hours as needed for nausea.     ondansetron 4 MG tablet  Commonly known as:  ZOFRAN  Take 1 tablet (4 mg total) by mouth 4 (four) times daily -  before meals and at bedtime.     pantoprazole 40 MG tablet  Commonly known as:  PROTONIX  Take 1 tablet (40 mg total) by mouth daily.     simvastatin 20 MG tablet  Commonly known as:  ZOCOR  Take 1 tablet (20 mg total) by mouth at bedtime.     SUMAtriptan 100 MG tablet  Commonly known as:  IMITREX  Take 100 mg by mouth every 2 (two) hours as needed for migraine or headache. May repeat in 2 hours if headache persists or recurs.     traMADol 50 MG tablet  Commonly known as:  ULTRAM  Take 1 tablet (50 mg total) by mouth every 6 (six) hours as needed for moderate pain.           Follow-up Information   Follow up with [facility] A, MD. Call in 3 weeks.   Specialty:  General Surgery   Contact information:   [address] [place] [address] [phone]       Follow up with Wyatt Haste, MD In 1 week.   Specialty:  Family Medicine   Contact information:   [place] [address] [phone]       Follow up with Juanita Craver, MD. Call in 1 week.   Specialty:  Gastroenterology   Contact information:   [address] [place] [address] [phone]       Signed: Harl Bowie 03/26/2013, 9:05 AM

## 2013-03-26 NOTE — Progress Notes (Signed)
Patient discharged to home accompanied by family. Discharge instructions and rx given and explained and patient stated understanding. PICC line was removed prior to discharge. Patient left unit in a stable condition via wheelchair.

## 2013-03-26 NOTE — Progress Notes (Signed)
Physical Therapy Treatment Patient Details Name: Terri Ayala MRN: [id number] DOB: [birth year] Today's Date: 03/26/2013 Time: 6295-2841 PT Time Calculation (min): 23 min  PT Assessment / Plan / Recommendation  History of Present Illness Admitted with N/V, gallstones, liver dysfunction; s/p lap chole; of note, has been on CIWA protocol   PT Comments   Pt declining OOB activity due to stating she is having GI issues right now but was agreeable to LE there-ex.    Follow Up Recommendations  Home health PT;Supervision/Assistance - 24 hour     Does the patient have the potential to tolerate intense rehabilitation     Barriers to Discharge        Equipment Recommendations  Rolling walker with 5" wheels;3in1 (PT)    Recommendations for Other Services OT consult  Frequency Min 3X/week   Progress towards PT Goals    Plan Current plan remains appropriate    Precautions / Restrictions Precautions Precautions: Fall Restrictions Weight Bearing Restrictions: No       Mobility       Exercises Total Joint Exercises Bridges: Strengthening;15 reps;Supine General Exercises - Lower Extremity Ankle Circles/Pumps: AROM;Both;15 reps Quad Sets: AROM;Both;15 reps Heel Slides: AROM;Strengthening;Both;15 reps Hip ABduction/ADduction: AROM;Strengthening;Left;15 reps Straight Leg Raises: AROM;Strengthening;Both;15 reps    PT Goals (current goals can now be found in the care plan section) Acute Rehab PT Goals PT Goal Formulation: With patient Time For Goal Achievement: 04/03/13 Potential to Achieve Goals: Good  Visit Information  Last PT Received On: 03/26/13 Assistance Needed: +1 History of Present Illness: Admitted with N/V, gallstones, liver dysfunction; s/p lap chole; of note, has been on CIWA protocol    Subjective Data      Cognition  Cognition Arousal/Alertness: Awake/alert Behavior During Therapy: WFL for tasks assessed/performed Overall Cognitive Status: Within Functional  Limits for tasks assessed    Balance     End of Session PT - End of Session Activity Tolerance: Patient tolerated treatment well Patient left: in bed;with call bell/phone within reach Nurse Communication: Mobility status   GP     Sena Hitch 03/26/2013, 12:59 PM  Sarajane Marek, PTA [phone] 03/26/2013

## 2013-03-26 NOTE — Discharge Instructions (Signed)
[facility] ______[facility] °LAPAROSCOPIC SURGERY: POST OP INSTRUCTIONS °Always review your discharge instruction sheet given to you by the facility where your surgery was performed. °IF YOU HAVE DISABILITY OR FAMILY LEAVE FORMS, YOU MUST BRING THEM TO THE OFFICE FOR PROCESSING.   °DO NOT GIVE THEM TO YOUR DOCTOR. ° °1. A prescription for pain medication may be given to you upon discharge.  Take your pain medication as prescribed, if needed.  If narcotic pain medicine is not needed, then you may take acetaminophen (Tylenol) or ibuprofen (Advil) as needed. °2. Take your usually prescribed medications unless otherwise directed. °3. If you need a refill on your pain medication, please contact your pharmacy.  They will contact our office to request authorization. Prescriptions will not be filled after 5pm or on week-ends. °4. You should follow a light diet the first few days after arrival home, such as soup and crackers, etc.  Be sure to include lots of fluids daily. °5. Most patients will experience some swelling and bruising in the area of the incisions.  Ice packs will help.  Swelling and bruising can take several days to resolve.  °6. It is common to experience some constipation if taking pain medication after surgery.  Increasing fluid intake and taking a stool softener (such as Colace) will usually help or prevent this problem from occurring.  A mild laxative (Milk of Magnesia or Miralax) should be taken according to package instructions if there are no bowel movements after 48 hours. °7. Unless discharge instructions indicate otherwise, you may remove your bandages 24-48 hours after surgery, and you may shower at that time.  You may have steri-strips (small skin tapes) in place directly over the incision.  These strips should be left on the skin for 7-10 days.  If your surgeon used skin glue on the incision, you may shower in 24 hours.  The glue will flake off over the next 2-3 weeks.  Any sutures or  staples will be removed at the office during your follow-up visit. °8. ACTIVITIES:  You may resume regular (light) daily activities beginning the next day--such as daily self-care, walking, climbing stairs--gradually increasing activities as tolerated.  You may have sexual intercourse when it is comfortable.  Refrain from any heavy lifting or straining until approved by your doctor. °a. You may drive when you are no longer taking prescription pain medication, you can comfortably wear a seatbelt, and you can safely maneuver your car and apply brakes. °b. RETURN TO WORK:  __________________________________________________________ °9. You should see your doctor in the office for a follow-up appointment approximately 2-3 weeks after your surgery.  Make sure that you call for this appointment within a day or two after you arrive home to insure a convenient appointment time. °10. OTHER INSTRUCTIONS: __________________________________________________________________________________________________________________________ __________________________________________________________________________________________________________________________ °WHEN TO CALL YOUR DOCTOR: °1. Fever over 101.0 °2. Inability to urinate °3. Continued bleeding from incision. °4. Increased pain, redness, or drainage from the incision. °5. Increasing abdominal pain ° °The clinic staff is available to answer your questions during regular business hours.  Please don’t hesitate to call and ask to speak to one of the nurses for clinical concerns.  If you have a medical emergency, go to the nearest emergency room or call 911.  A surgeon from [facility] is always on call at the hospital. °[address], [address], [place], [place]  [address] ? [address] [address], [place],    [address] °[phone] ? [phone] ? FAX [phone] °Web site:   [email or web address]

## 2013-03-29 ENCOUNTER — Telehealth: Payer: Self-pay | Admitting: Internal Medicine

## 2013-03-29 NOTE — Telephone Encounter (Signed)
Robin with [facility] called to let you know which you probably already know that zocor, cardizem interact with each other/ just an fyi

## 2013-04-22 ENCOUNTER — Ambulatory Visit (INDEPENDENT_AMBULATORY_CARE_PROVIDER_SITE_OTHER): Payer: Federal, State, Local not specified - PPO | Admitting: Family Medicine

## 2013-04-22 ENCOUNTER — Encounter: Payer: Self-pay | Admitting: Family Medicine

## 2013-04-22 VITALS — BP 124/78 | HR 72 | Wt 109.0 lb

## 2013-04-22 DIAGNOSIS — K3184 Gastroparesis: Secondary | ICD-10-CM

## 2013-04-22 DIAGNOSIS — K7689 Other specified diseases of liver: Secondary | ICD-10-CM

## 2013-04-22 DIAGNOSIS — R208 Other disturbances of skin sensation: Secondary | ICD-10-CM

## 2013-04-22 DIAGNOSIS — R209 Unspecified disturbances of skin sensation: Secondary | ICD-10-CM

## 2013-04-22 DIAGNOSIS — K760 Fatty (change of) liver, not elsewhere classified: Secondary | ICD-10-CM

## 2013-04-22 LAB — CBC WITH DIFFERENTIAL/PLATELET
BASOS ABS: 0.1 10*3/uL (ref 0.0–0.1)
Basophils Relative: 1 % (ref 0–1)
EOS ABS: 0.1 10*3/uL (ref 0.0–0.7)
Eosinophils Relative: 1 % (ref 0–5)
HCT: 37.6 % (ref 36.0–46.0)
HEMOGLOBIN: 13.1 g/dL (ref 12.0–15.0)
Lymphocytes Relative: 24 % (ref 12–46)
Lymphs Abs: 1.6 10*3/uL (ref 0.7–4.0)
MCH: 33.4 pg (ref 26.0–34.0)
MCHC: 34.8 g/dL (ref 30.0–36.0)
MCV: 95.9 fL (ref 78.0–100.0)
MONO ABS: 0.5 10*3/uL (ref 0.1–1.0)
Monocytes Relative: 7 % (ref 3–12)
NEUTROS ABS: 4.4 10*3/uL (ref 1.7–7.7)
Neutrophils Relative %: 67 % (ref 43–77)
Platelets: 186 10*3/uL (ref 150–400)
RBC: 3.92 MIL/uL (ref 3.87–5.11)
RDW: 13.2 % (ref 11.5–15.5)
WBC: 6.5 10*3/uL (ref 4.0–10.5)

## 2013-04-22 LAB — COMPREHENSIVE METABOLIC PANEL
ALK PHOS: 87 U/L (ref 39–117)
ALT: 13 U/L (ref 0–35)
AST: 52 U/L — ABNORMAL HIGH (ref 0–37)
Albumin: 4.2 g/dL (ref 3.5–5.2)
BUN: 5 mg/dL — AB (ref 6–23)
CO2: 28 mEq/L (ref 19–32)
CREATININE: 0.39 mg/dL — AB (ref 0.50–1.10)
Calcium: 9.6 mg/dL (ref 8.4–10.5)
Chloride: 102 mEq/L (ref 96–112)
GLUCOSE: 90 mg/dL (ref 70–99)
POTASSIUM: 3.8 meq/L (ref 3.5–5.3)
Sodium: 139 mEq/L (ref 135–145)
Total Bilirubin: 0.7 mg/dL (ref 0.2–1.2)
Total Protein: 6.9 g/dL (ref 6.0–8.3)

## 2013-04-22 NOTE — Progress Notes (Signed)
   Subjective:    Patient ID: Terri Ayala, female    DOB: 1955/10/24, 58 y.o.   MRN: [id number]  HPI She is here for recheck. She was recently hospitalized and subsequently had gallbladder surgery. Apparently the gallbladder was entirely normal. A diffuse fatty liver was seen. Postoperatively she had bilateral lower extremity symptoms of burning type of pain. She now describes it as burning stinging intermittent pain in both lower remedies. She has no numbness tingling or weakness. She was placed on Neurontin 100 mg 3 times a day and has noted no change in her symptoms. She has been is followed up by Dr. Collene Mares concerning her GI symptoms. She mainly complains of abdominal discomfort and a feeling of having to have a BM after she eats. She has been on Colace and actually states that the discomfort has increased well being on the Colace.   Review of Systems     Objective:   Physical Exam  The hospital record was reviewed Alert and in no distress. Abdominal exam shows recent surgical scars but no masses or tenderness with normal bowel sounds. Lower extremity exam shows normal sensation and motor as well as DTRs. No clonus is noted. Cerebellar testing normal.       Assessment & Plan:  Gastroparesis - Plan: CBC with Differential, Comprehensive metabolic panel  Fatty liver - Plan: CBC with Differential, Comprehensive metabolic panel  Dysesthesia affecting both sides of body - Plan: Ambulatory referral to Neurology  she will continue to be followed by Dr. Collene Mares concerning her abdominal symptoms. Her neurologic symptoms are problematic and I will refer her to neurology.

## 2013-04-24 ENCOUNTER — Telehealth: Payer: Self-pay | Admitting: Family Medicine

## 2013-04-24 NOTE — Telephone Encounter (Signed)
My notes don't indicate that was going to call something in. Find out from her what she remembers and let me know

## 2013-04-24 NOTE — Telephone Encounter (Signed)
Pt is wanting xanax called in

## 2013-04-25 NOTE — Telephone Encounter (Signed)
Go ahead and call in Xanax in.

## 2013-04-26 ENCOUNTER — Encounter: Payer: Self-pay | Admitting: Neurology

## 2013-04-26 ENCOUNTER — Ambulatory Visit (INDEPENDENT_AMBULATORY_CARE_PROVIDER_SITE_OTHER): Payer: Federal, State, Local not specified - PPO | Admitting: Neurology

## 2013-04-26 ENCOUNTER — Other Ambulatory Visit: Payer: Self-pay | Admitting: Family Medicine

## 2013-04-26 VITALS — BP 110/80 | HR 84 | Temp 97.8°F | Ht 62.6 in | Wt 109.4 lb

## 2013-04-26 DIAGNOSIS — M79609 Pain in unspecified limb: Secondary | ICD-10-CM

## 2013-04-26 DIAGNOSIS — M79605 Pain in left leg: Secondary | ICD-10-CM

## 2013-04-26 DIAGNOSIS — G589 Mononeuropathy, unspecified: Secondary | ICD-10-CM

## 2013-04-26 DIAGNOSIS — R292 Abnormal reflex: Secondary | ICD-10-CM

## 2013-04-26 DIAGNOSIS — G629 Polyneuropathy, unspecified: Secondary | ICD-10-CM

## 2013-04-26 DIAGNOSIS — M79604 Pain in right leg: Secondary | ICD-10-CM

## 2013-04-26 NOTE — Telephone Encounter (Signed)
Called in medicine per Mickel Baas

## 2013-04-26 NOTE — Telephone Encounter (Signed)
Medication called in 

## 2013-04-26 NOTE — Telephone Encounter (Signed)
Go ahead and call this in 

## 2013-04-26 NOTE — Telephone Encounter (Signed)
Called pharmacy & no P.A. Needed, only need authorization for refill, this was given to pharmacist Xanax #[phone]

## 2013-04-26 NOTE — Progress Notes (Signed)
[facility] Clinic Note - Initial Visit   Date: 04/26/2013    AIDELIZ GARMANY MRN: [id number] DOB: 1955/07/07   Dear Dr Redmond School:  Thank you for your kind referral of ANANI GU for consultation of pain the legs and feet. Although her history is well known to you, please allow [place] to reiterate it for the purpose of our medical record. The patient was accompanied to the clinic by self.    History of Present Illness: RAFIA SHEDDEN is a 58 y.o. right-handed Caucasian female with history of asthma, hyperlipidemia, OA, anxiety, gastroparesis, migraines, and recently diagnosed with hepatic steatosis presenting for evaluation of bilateral leg pain and paresthesias.    She underwent laproscopic cholecystomy on January 15th 2015 due to chronic nausea and elevated LFTs.  Pre-operatively she was noted to have moderately severe malnutrition (prealbumin 12.1) for which she was started on TPN.  Post-operatively, she was being evaluated for her transaminitis and had been laying in bed for the entire week and one week later, she developed sudden onset of bilateral leg pain (thigh down to feet), described as "pins, needles, stabbing".  The reported being unable to walk because of the pain and needed a walker.  Pain has been constant since onset.  It is worse at night and prolonged sitting.  It is improved by gabapentin 100mg  TID which helps reduce the intensity of pain.  She denies any bowel/bladder incontinence or weakness.  She was discharged home with a walker and PT/OT.  She currently ambulates independently.     Out-side paper records, electronic medical record, and images have been reviewed where available and summarized as:  MRI cervical 06/08/2006: 1. Normal alignment except for very slight posterior subluxation of C-5 compared to C-4 and C-6 likely due to facet disease.  2. Moderate facet disease throughout the cervical spine.  3. Tiny disc protrusion at C6-7 but no neural  compression.  4. Mild foraminal encroachment on the left at C3-4 and C4-5 largely due to facet disease.   Lab Results  Component Value Date   TSH 1.398 10/04/2012   Lab Results  Component Value Date   HGBA1C 5.0 03/19/2013     Lab Results  Component Value Date   ALT 13 04/22/2013   AST 52* 04/22/2013   ALKPHOS 87 04/22/2013   BILITOT 0.7 04/22/2013     Past Medical History  Diagnosis Date  . Low back pain   . Anxiety   . Migraines   . GOITER, MULTINODULAR 08/17/2009  . OSTEOPOROSIS 08/17/2009  . ASYMPTOMATIC POSTMENOPAUSAL STATUS 08/17/2009  . DYSPHAGIA 06/19/2007  . Supraventricular tachycardia   . Hyperlipidemia   . Mitral valve prolapse   . PONV (postoperative nausea and vomiting)     Past Surgical History  Procedure Laterality Date  . Laporoscopic abdominal surgery    . Cervix surgery    . Cholecystectomy N/A 03/14/2013    Procedure: LAPAROSCOPIC CHOLECYSTECTOMY WITH ATTEMPTED INTRAOPERATIVE CHOLANGIOGRAM;  Surgeon: Harl Bowie, MD;  Location: [facility];  Service: General;  Laterality: N/A;     Medications:  Current Outpatient Prescriptions on File Prior to Visit  Medication Sig Dispense Refill  . alendronate (FOSAMAX) 70 MG tablet Take 1 tablet (70 mg total) by mouth every 7 (seven) days. Take with a full glass of water on an empty stomach.  4 tablet  11  . ALPRAZolam (XANAX) 0.5 MG tablet Take 1 tablet (0.5 mg total) by mouth daily as needed. For anxiety  90  tablet  1  . aspirin 81 MG tablet Take 81 mg by mouth daily.        . calcium carbonate 200 MG capsule Take 200 mg by mouth daily.       . cholecalciferol (VITAMIN D) 1000 UNITS tablet Take 1,000 Units by mouth daily.        [name] Kitchen diltiazem (CARDIZEM CD) 240 MG 24 hr capsule Take 1 capsule (240 mg total) by mouth daily.  30 capsule  11  . gabapentin (NEURONTIN) 100 MG capsule Take 1 capsule (100 mg total) by mouth 3 (three) times daily.  60 capsule  2  . glucosamine-chondroitin 500-400 MG tablet Take 1 tablet by  mouth daily.       . metoCLOPramide (REGLAN) 10 MG tablet Take 10 mg by mouth 3 (three) times daily before meals.       . norethindrone-ethinyl estradiol (FEMHRT 1/5) 1-5 MG-MCG TABS Take 1 tablet by mouth daily.       . ondansetron (ZOFRAN) 8 MG tablet Take 8 mg by mouth every 8 (eight) hours as needed for nausea.       . pantoprazole (PROTONIX) 40 MG tablet Take 1 tablet (40 mg total) by mouth daily.  30 tablet  11  . simvastatin (ZOCOR) 20 MG tablet Take 1 tablet (20 mg total) by mouth at bedtime.  30 tablet  11  . SUMAtriptan (IMITREX) 100 MG tablet Take 100 mg by mouth every 2 (two) hours as needed for migraine or headache. May repeat in 2 hours if headache persists or recurs.      . traMADol (ULTRAM) 50 MG tablet Take 1 tablet (50 mg total) by mouth every 6 (six) hours as needed for moderate pain.  40 tablet  0   No current facility-administered medications on file prior to visit.    Allergies:  Allergies  Allergen Reactions  . Codeine Itching  . Doxycycline Other (See Comments)    Reaction unknown  . Tetracycline Hcl Other (See Comments)    Reaction unknown    Family History: Family History  Problem Relation Age of Onset  . Thyroid disease Neg Hx   . Goiter Neg Hx   . Colon cancer Father     Deceased, 27  . Osteoporosis Mother     Living, 18  . Healthy Brother     Social History: History   Social History  . Marital Status: Single    Spouse Name: N/A    Number of Children: N/A  . Years of Education: N/A   Occupational History  . Biologist    Social History Main Topics  . Smoking status: Never Smoker   . Smokeless tobacco: Never Used  . Alcohol Use: No  . Drug Use: No  . Sexual Activity: Not on file   Other Topics Concern  . Not on file   Social History Narrative   Lives alone.  She has a BS biology.   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    Review of Systems:  CONSTITUTIONAL: No fevers, chills, night sweats, +weight  loss.   EYES: No visual changes or eye pain ENT: No hearing changes.  No history of nose bleeds.   RESPIRATORY: No cough, wheezing and shortness of breath.   CARDIOVASCULAR: Negative for chest pain, and palpitations.   GI: Negative for abdominal discomfort, blood in stools or black stools.  No recent change in bowel habits.  +nausea GU:  No history of incontinence.  MUSCLOSKELETAL: No history of joint pain or swelling.  No myalgias.   SKIN: Negative for lesions, rash, and itching.   HEMATOLOGY/ONCOLOGY: Negative for prolonged bleeding, bruising easily, and swollen nodes.  No history of cancer.   ENDOCRINE: Negative for cold or heat intolerance, polydipsia or goiter.   PSYCH:  No depression +anxiety symptoms.   NEURO: As Above.   Vital Signs:  BP 110/80  Pulse 84  Temp(Src) 97.8 F (36.6 C)  Ht 5' 2.6" (1.59 m)  Wt 109 lb 7 oz (49.641 kg)  BMI 19.64 kg/m2   General Medical Exam:   General:  Thin-appearing, comfortable.   Eyes/ENT: see cranial nerve examination.   Neck: No masses appreciated.  Full range of motion without tenderness.   Back:  No pain to palpation of spinous processes.   Extremities:  No deformities, edema, or skin discoloration. Good capillary refill.   Skin:  Skin color, texture, turgor normal. No rashes or lesions.  Neurological Exam: MENTAL STATUS including orientation to time, place, person, recent and remote memory, attention span and concentration, language, and fund of knowledge is normal.  Speech is not dysarthric.  CRANIAL NERVES: II:  No visual field defects.  Unremarkable fundi.   III-IV-VI: Pupils equal round and reactive to light.  Normal conjugate, extra-ocular eye movements in all directions of gaze.  No nystagmus.  No ptosis.   V:  Normal facial sensation.  Jaw jerk is absent.   VII:  Normal facial symmetry and movements.  No pathologic facial reflexes.  VIII:  Normal hearing and vestibular function.   IX-X:  Normal palatal movement.   XI:   Normal shoulder shrug and head rotation.   XII:  Normal tongue strength and range of motion, no deviation or fasciculation.  MOTOR:  No atrophy, fasciculations or abnormal movements.  No pronator drift.  Tone is normal.    Right Upper Extremity:    Left Upper Extremity:    Deltoid  5/5   Deltoid  5/5   Biceps  5/5   Biceps  5/5   Triceps  5/5   Triceps  5/5   Wrist extensors  5/5   Wrist extensors  5/5   Wrist flexors  5/5   Wrist flexors  5/5   Finger extensors  5/5   Finger extensors  5/5   Finger flexors  5/5   Finger flexors  5/5   Dorsal interossei  5/5   Dorsal interossei  5/5   Abductor pollicis  5/5   Abductor pollicis  5/5   Tone (Ashworth scale)  0+  Tone (Ashworth scale)  0+   Right Lower Extremity:    Left Lower Extremity:    Hip flexors  5/5   Hip flexors  5/5   Hip extensors  5/5   Hip extensors  5/5   Knee flexors  5/5   Knee flexors  5/5   Knee extensors  5/5   Knee extensors  5/5   Dorsiflexors  5/5   Dorsiflexors  5/5   Plantarflexors  5/5   Plantarflexors  5/5   Toe extensors  5/5   Toe extensors  5/5   Toe flexors  5/5   Toe flexors  5/5   Tone (Ashworth scale)  0  Tone (Ashworth scale)  0+   MSRs:  Right  Left brachioradialis 3+  brachioradialis 3+  biceps 3+  biceps 3+  triceps 3+  triceps 3+  patellar 3+  patellar 3+  ankle jerk 1+  ankle jerk 2+  Hoffman no  Hoffman no  plantar response down  plantar response down  No clonus.   SENSORY:  Vibration is reduced to 20% at the ankles and absent at great toe.  Impaired proprioception at great toe. Normal and symmetric perception of light touch and  pinprick.  Romberg's sign present.   COORDINATION/GAIT: Normal finger-to- nose-finger and heel-to-shin.  Intact rapid alternating movements bilaterally.  Able to rise from a chair without using arms.  Stooped posture with narrow based and stable gait.  She is unsteady with tandem gait, but able to  perform stressed gait.   IMPRESSION: Ms. Lanzo is a 58 year-old female presenting for evaluation of acute onset of bilateral leg pain and paresthesias.  Symptoms started fairly suddenly one week following cholecystectomy and starting parenteral nutrition (hospitalized 1/15-1/27/2015).  Of note, her labs on 1/19 were notable for hypophosphatemia (2.0) which was corrected to 4.1 the following day, hypomagnesemia (0.8) corrected to 2.2, potassium 2.6 corrected to 3.19mEq.    Her neurological examination shows brisk reflexes throughout with subtle increase in tone in the upper extremities and left leg which may be due to cervical canal stenosis.  There is large fiber sensory loss in the legs along (vibration and proprioception) consistent with a peripheral neuropathy.  Due to both UMN and LMN findings on exam, I would like to obtain imaging of the lumbar spine and EMG.  Additionally, labs for treatable causes of neuropathy will be checked, especially nutritional deficiencies given recent malnutrition.  If secondary causes are not found and EMG is suggestive of peripheral neuropathy, it is possible that it is due to complications from refeeding syndrome and rapid correction of electrolytes.  Let's see what work-up reveals.   PLAN/RECOMMENDATIONS:  1.  Check vitamin B12, vitamin B1, vitamin B6, copper, folate, celiac panel, UPEP/SPEP IFE, zinc 2.  EMG of the legs (R >L) 3.  MRI of the lumbar spine wo contrast 4.  Increase neurontin to gabapentin to 100mg  in the morning, 100mg  in the afternoon, and 200mg  at bedtime and titrate further as tolerable.  Risks and benefits discussed. 5. Return to clinic in 6-weeks   The duration of this appointment visit was 50 minutes of face-to-face time with the patient.  Greater than 50% of this time was spent in counseling, explanation of diagnosis, planning of further management, and coordination of care.   Thank you for allowing me to participate in patient's care.   If I can answer any additional questions, I would be pleased to do so.    Sincerely,    Madgeline Rayo K. Posey Pronto, DO

## 2013-04-26 NOTE — Telephone Encounter (Signed)
Waiting to here back for authorization per Bevelyn Ngo

## 2013-04-26 NOTE — Telephone Encounter (Signed)
Can you call in this

## 2013-04-26 NOTE — Patient Instructions (Addendum)
1.  Your provider has requested that you have labwork completed today. Please go to [facility] on the first floor of this building before leaving the office today. 2.  EMG of the legs - 90 minutes 3.  We have scheduled you at [place] for your OPEN MRI lumbar spine on 04/30/2013 at 9:00 pm. Please arrive 15 minutes prior and go [place].  4.  Increase gabapentin to 100mg  in the morning, 100mg  in the afternoon, and 200mg  at bedtime.  If you are doing ok at this dose after one week, increase to 200mg  in the morning, 100mg  in the afternoon, and 200mg  at bedtime. 5.  Call the office in 2-week to let me know how you are tolerating the medication. 6.  Return to clinic 6-weeks

## 2013-04-26 NOTE — Telephone Encounter (Signed)
I called [facility] to have this pt medication refilled and the pharmacist stated that they are waiting on approval for this medication from Dr. Redmond School. Do you know where we stand on getting this approved?

## 2013-04-26 NOTE — Telephone Encounter (Signed)
IS THIS OKAY TO REFILL 

## 2013-04-27 LAB — FOLATE: Folate: >20 ng/mL

## 2013-04-27 LAB — VITAMIN B12: Vitamin B-12: 612 pg/mL (ref 211–911)

## 2013-04-28 LAB — COPPER, SERUM: COPPER: 72 ug/dL (ref 70–175)

## 2013-04-28 LAB — ZINC: ZINC: 44 ug/dL — AB (ref 60–130)

## 2013-04-30 ENCOUNTER — Other Ambulatory Visit: Payer: Federal, State, Local not specified - PPO

## 2013-04-30 LAB — SPEP & IFE WITH QIG
Albumin ELP: 60.8 % (ref 55.8–66.1)
Alpha-1-Globulin: 3.3 % (ref 2.9–4.9)
Alpha-2-Globulin: 10.2 % (ref 7.1–11.8)
Beta 2: 5.3 % (ref 3.2–6.5)
Beta Globulin: 6 % (ref 4.7–7.2)
GAMMA GLOBULIN: 14.4 % (ref 11.1–18.8)
IGM, SERUM: 66 mg/dL (ref 52–322)
IgA: 397 mg/dL — ABNORMAL HIGH (ref 69–380)
IgG (Immunoglobin G), Serum: 857 mg/dL (ref 690–1700)
Total Protein, Serum Electrophoresis: 6.3 g/dL (ref 6.0–8.3)

## 2013-04-30 LAB — UIFE/LIGHT CHAINS/TP QN, 24-HR UR
Albumin, U: DETECTED
Free Kappa Lt Chains,Ur: 0.21 mg/dL (ref 0.14–2.42)
Free Kappa/Lambda Ratio: 7 ratio (ref 2.04–10.37)
Free Lambda Lt Chains,Ur: 0.03 mg/dL (ref 0.02–0.67)
Total Protein, Urine: 0.7 mg/dL

## 2013-05-01 LAB — VITAMIN B6: VITAMIN B6: 23.8 ng/mL — AB (ref 2.1–21.7)

## 2013-05-02 LAB — VITAMIN B1: VITAMIN B1 (THIAMINE): 14 nmol/L (ref 8–30)

## 2013-05-04 ENCOUNTER — Ambulatory Visit
Admission: RE | Admit: 2013-05-04 | Discharge: 2013-05-04 | Disposition: A | Discharge status: Home / Self Care | Payer: Federal, State, Local not specified - PPO | Source: Ambulatory Visit | Attending: Neurology | Admitting: Neurology

## 2013-05-04 ENCOUNTER — Other Ambulatory Visit: Payer: Federal, State, Local not specified - PPO

## 2013-05-04 DIAGNOSIS — G629 Polyneuropathy, unspecified: Secondary | ICD-10-CM

## 2013-05-04 DIAGNOSIS — M79605 Pain in left leg: Secondary | ICD-10-CM

## 2013-05-04 DIAGNOSIS — M79604 Pain in right leg: Secondary | ICD-10-CM

## 2013-05-04 DIAGNOSIS — R292 Abnormal reflex: Secondary | ICD-10-CM

## 2013-05-06 LAB — GLIA (IGA/G) + TTG IGA
Gliadin IgG: 29.4 U/mL — ABNORMAL HIGH (ref <20)
TISSUE TRANSGLUTAMINASE AB, IGA: 10.1 U/mL (ref <20)

## 2013-05-06 LAB — OTHER SOLSTAS TEST: Miscellaneous Test: 79742

## 2013-05-07 ENCOUNTER — Telehealth: Payer: Self-pay | Admitting: Neurology

## 2013-05-07 ENCOUNTER — Other Ambulatory Visit: Payer: Self-pay | Admitting: *Deleted

## 2013-05-07 NOTE — Telephone Encounter (Signed)
Returning a call to [place]. Please call after 130pm at [phone] or [phone] / Sherri S.

## 2013-05-08 ENCOUNTER — Other Ambulatory Visit: Payer: Self-pay | Admitting: *Deleted

## 2013-05-20 ENCOUNTER — Ambulatory Visit (INDEPENDENT_AMBULATORY_CARE_PROVIDER_SITE_OTHER): Payer: Federal, State, Local not specified - PPO | Admitting: Neurology

## 2013-05-20 ENCOUNTER — Encounter: Payer: Self-pay | Admitting: Neurology

## 2013-05-20 DIAGNOSIS — IMO0002 Reserved for concepts with insufficient information to code with codable children: Secondary | ICD-10-CM

## 2013-05-20 DIAGNOSIS — M5417 Radiculopathy, lumbosacral region: Secondary | ICD-10-CM | POA: Insufficient documentation

## 2013-05-20 NOTE — Procedures (Signed)
[facility]  [place], [address]  [place], [place] [address] Tel: [phone] Fax:  [phone] Test Date:  05/20/2013  Patient: Terri Ayala DOB: October 27, 1955 Physician: Narda Amber, DO  Sex: Female Height: 5\' 2"  Ref Phys: Narda Amber  ID#: [id number] Temp: 36.4C Technician:    Patient Complaints: This is a 58 year-old female presenting with bilateral leg pain and paresthesias.  NCV & EMG Findings: Extensive electrodiagnostic testing of the bilateral lower extremities reveals the following: 1. Normal sural and superficial peroneal sensory responses bilaterally. 2. Normal tibial and peroneal motor responses bilaterally. 3. Chronic motor axon loss changes affecting L5 myotomes bilaterally without active denervation.  Impression: There is electrophysiological evidence of an old intraspinal canal lesions (i.e. radiculopathy) affecting bilateral L5 nerve root/segment. Overall, these findings are mild in degree electrically.  There is no evidence of a generalized sensorimotor polyneuropathy affecting the lower extremities.   ___________________________ Narda Amber, DO    Nerve Conduction Studies Anti Sensory Summary Table   Site NR Peak (ms) Norm Peak (ms) P-T Amp (V) Norm P-T Amp  Left Sup Peroneal Anti Sensory (Ant Lat Mall)  12 cm    3.4 <4.6 7.1 >3  Right Sup Peroneal Anti Sensory (Ant Lat Mall)  12 cm    3.1 <4.6 7.0 >3  Left Sural Anti Sensory (Lat Mall)  Calf    4.0 <4.6 7.4 >3  Right Sural Anti Sensory (Lat Mall)  Calf    3.9 <4.6 6.0 >3   Motor Summary Table   Site NR Onset (ms) Norm Onset (ms) O-P Amp (mV) Norm O-P Amp Site1 Site2 Delta-0 (ms) Dist (cm) Vel (m/s) Norm Vel (m/s)  Left Peroneal Motor (Ext Dig Brev)  Ankle    4.0 <6.0 4.4 >2.5 B Fib Ankle 7.2 31.0 43 >40  B Fib    11.2  3.9  Poplt B Fib 2.0 10.0 50 >40  Poplt    13.2  3.9         Right Peroneal Motor (Ext Dig Brev)  Ankle    3.4 <6.0 3.7 >2.5 B Fib Ankle 6.4 32.0 50 >40  B  Fib    9.8  3.6  Poplt B Fib 1.9 9.0 47 >40  Poplt    11.7  3.5         Left Peroneal TA Motor (Tib Ant)  Fib Head    3.8 <4.5 11.0 >3 Poplit Fib Head 7.9 37.0 47 >40  Poplit    11.7  7.0         Right Tibial Motor (Abd Hall Brev)  Ankle    3.6 <6.0 11.0 >4 Knee Ankle 8.0 36.0 45 >40  Knee    11.6  7.5          EMG   Side Muscle Ins Act Fibs Psw Fasc Number Recrt Dur Dur. Amp Amp. Poly Poly. Comment  Right AntTibialis Nml Nml Nml Nml 1- Mod-R Few 1+ Nml Nml Nml Nml N/A  Right Gastroc Nml Nml Nml Nml Nml Nml Nml Nml Nml Nml Nml Nml N/A  Right Flex Dig Long Nml Nml Nml Nml 1- Mod Few 1- Nml Nml Nml Nml N/A  Right RectFemoris Nml Nml Nml Nml Nml Nml Nml Nml Nml Nml Nml Nml N/A  Right GluteusMed Nml Nml Nml Nml 1- Mod Few 1+ Nml Nml Nml Nml N/A  Left AntTibialis Nml Nml Nml Nml 1- Mod-R Few 1+ Nml Nml Nml Nml N/A  Left Gastroc Nml Nml Nml Nml  Nml Nml Nml Nml Nml Nml Nml Nml N/A  Left Flex Dig Long Nml Nml Nml Nml 1- Mod-R Few 1+ Nml Nml Nml Nml N/A      Waveforms:

## 2013-05-20 NOTE — Progress Notes (Signed)
See procedure note for EMG results.  Donika K. Patel, DO  

## 2013-05-24 ENCOUNTER — Encounter: Payer: Self-pay | Admitting: Neurology

## 2013-05-24 ENCOUNTER — Ambulatory Visit (INDEPENDENT_AMBULATORY_CARE_PROVIDER_SITE_OTHER): Payer: Federal, State, Local not specified - PPO | Admitting: Neurology

## 2013-05-24 VITALS — BP 110/70 | HR 76 | Wt 107.1 lb

## 2013-05-24 DIAGNOSIS — R5383 Other fatigue: Secondary | ICD-10-CM

## 2013-05-24 DIAGNOSIS — R5381 Other malaise: Secondary | ICD-10-CM

## 2013-05-24 DIAGNOSIS — R292 Abnormal reflex: Secondary | ICD-10-CM

## 2013-05-24 DIAGNOSIS — IMO0002 Reserved for concepts with insufficient information to code with codable children: Secondary | ICD-10-CM

## 2013-05-24 DIAGNOSIS — M5417 Radiculopathy, lumbosacral region: Secondary | ICD-10-CM

## 2013-05-24 DIAGNOSIS — R531 Weakness: Secondary | ICD-10-CM

## 2013-05-24 DIAGNOSIS — R202 Paresthesia of skin: Secondary | ICD-10-CM

## 2013-05-24 DIAGNOSIS — R269 Unspecified abnormalities of gait and mobility: Secondary | ICD-10-CM

## 2013-05-24 DIAGNOSIS — R209 Unspecified disturbances of skin sensation: Secondary | ICD-10-CM

## 2013-05-24 NOTE — Progress Notes (Signed)
Follow-up Visit   Date: 05/24/2013    Terri Ayala MRN: [id number] DOB: 1955-11-22   Interim History: Terri Ayala is a 58 y.o. right-handed Caucasian female with history of asthma, hyperlipidemia, OA, anxiety, gastroparesis, migraines returning to the clinic for follow-up of bilateral leg pain and paresthesias.  The patient was accompanied to the clinic by self.  History of present illness: She underwent laproscopic cholecystomy on January 15th 2015 due to chronic nausea and elevated LFTs. Pre-operatively she was noted to have moderately severe malnutrition (prealbumin 12.1) for which she was started on TPN. Post-operatively, she was being evaluated for her transaminitis and had been laying in bed for the entire week and one week later, she developed sudden onset of bilateral leg pain (thigh down to feet), described as "pins, needles, stabbing". The reported being unable to walk because of the pain and needed a walker. Pain has been constant since onset. It is worse at night and prolonged sitting. It is improved by gabapentin 100mg  TID which helps reduce the intensity of pain. She denies any bowel/bladder incontinence or weakness. She was discharged home with a walker and PT/OT. She currently ambulates independently.   - Follow-up 05/24/2013:  She reports no improvement with increasing gabapentin to 100-100-200mg , but has noticed pain in her joints (shoulder and fingers).  EMG of the lower extremities was performed and shows an old L5 radiculopathy, but no evidence of neuropathy.  Her laboratory testing has been normal except for reduced level of zinc.    Medications:  Current Outpatient Prescriptions on File Prior to Visit  Medication Sig Dispense Refill  . alendronate (FOSAMAX) 70 MG tablet Take 1 tablet (70 mg total) by mouth every 7 (seven) days. Take with a full glass of water on an empty stomach.  4 tablet  11  . ALPRAZolam (XANAX) 0.5 MG tablet TAKE 1 TABLET BY MOUTH EVERY DAY AS  NEEDED FOR ANXIETY  90 tablet  0  . aspirin 81 MG tablet Take 81 mg by mouth daily.        . calcium carbonate 200 MG capsule Take 200 mg by mouth daily.       . cholecalciferol (VITAMIN D) 1000 UNITS tablet Take 1,000 Units by mouth daily.        [name] Kitchen diltiazem (CARDIZEM CD) 240 MG 24 hr capsule Take 1 capsule (240 mg total) by mouth daily.  30 capsule  11  . gabapentin (NEURONTIN) 100 MG capsule Take 1 capsule (100 mg total) by mouth 3 (three) times daily.  60 capsule  2  . glucosamine-chondroitin 500-400 MG tablet Take 1 tablet by mouth daily.       . metoCLOPramide (REGLAN) 10 MG tablet Take 10 mg by mouth 3 (three) times daily before meals.       . norethindrone-ethinyl estradiol (FEMHRT 1/5) 1-5 MG-MCG TABS Take 1 tablet by mouth daily.       . ondansetron (ZOFRAN) 8 MG tablet Take 8 mg by mouth every 8 (eight) hours as needed for nausea.       . pantoprazole (PROTONIX) 40 MG tablet Take 1 tablet (40 mg total) by mouth daily.  30 tablet  11  . simvastatin (ZOCOR) 20 MG tablet Take 1 tablet (20 mg total) by mouth at bedtime.  30 tablet  11  . SUMAtriptan (IMITREX) 100 MG tablet Take 100 mg by mouth every 2 (two) hours as needed for migraine or headache. May repeat in 2 hours if headache persists or recurs.      [name] Kitchen  traMADol (ULTRAM) 50 MG tablet Take 1 tablet (50 mg total) by mouth every 6 (six) hours as needed for moderate pain.  40 tablet  0   No current facility-administered medications on file prior to visit.    Allergies:  Allergies  Allergen Reactions  . Codeine Itching  . Doxycycline Other (See Comments)    Reaction unknown  . Tetracycline Hcl Other (See Comments)    Reaction unknown     Review of Systems:  CONSTITUTIONAL: No fevers, chills, night sweats, + 2lb weight loss.   EYES: No visual changes or eye pain ENT: No hearing changes.  No history of nose bleeds.   RESPIRATORY: No cough, wheezing and shortness of breath.   CARDIOVASCULAR: Negative for chest pain, and  palpitations.   GI: Negative for abdominal discomfort, blood in stools or black stools.  No recent change in bowel habits.   GU:  No history of incontinence.   MUSCLOSKELETAL: + history of joint pain or swelling.  No myalgias.   SKIN: Negative for lesions, rash, and itching.   ENDOCRINE: Negative for cold or heat intolerance, polydipsia or goiter.   PSYCH:  + depression or anxiety symptoms.   NEURO: As Above.   Vital Signs:  BP 110/70  Pulse 76  Wt 107 lb 1 oz (48.563 kg)  SpO2 99%  Neurological Exam: MENTAL STATUS including orientation to time, place, person, recent and remote memory, attention span and concentration, language, and fund of knowledge is normal.  Speech is not dysarthric.  CRANIAL NERVES: Pupils equal round and reactive to light.  Normal conjugate, extra-ocular eye movements in all directions of gaze.  No ptosis. Normal facial sensation.  Face is symmetric. Palate elevates symmetrically.  Tongue is midline.  MOTOR:  Motor strength is 5/5 in all extremities.  Reduced shoulder ROM with arm abduction due to pain.  No atrophy, fasciculations or abnormal movements.  No pronator drift.  Tone is slightly increased in upper extremities (0+).    MSRs:  Reflexes are 3+/4 throughout, except 1+ Achilles bilaterally.  SENSORY: Vibration is rduced to 20% at the ankles and absent at great toe. Impaired proprioception at great toe. Normal and symmetric perception of light touch and pinprick. Romberg's sign present.  COORDINATION/GAIT: Stooped posture with narrow based and stable gait. She is unsteady with tandem gait, but able to perform stressed gait.   Data: MRI lumbar spine wo contrast 05/08/2013: Shallow disc protrusions at L4-5 and L5-S1 without stenosis.  EMG 05/20/2013: There is electrophysiological evidence of an old intraspinal canal lesions (i.e. radiculopathy) affecting bilateral L5 nerve root/segment. Overall, these findings are mild in degree electrically.  There is no  evidence of a generalized sensorimotor polyneuropathy affecting the lower extremities.   Labs 2/23/ 2015:  Vitamin B12 612, vitamin B1 14, vitamin B6 23.8, copper 72, folate >20, celiac panel nonspecific mild IgG gliadin elevation, SPEP/UPEP no M protein, zinc 44*   IMPRESSION/PLAN: 1. Bilateral leg paresthesias  - No evidence of large fiber neuropathy on EMG.  There is mild L5 radiculopathy bilaterally.  - MRI lumbar spine with mild disc protrusion at L4-5 and L5-S1  - Continue gabapentin 300mg  BID 2.  Zinc deficiency, may be contributing to #1 - new problem  - Can cause myelopathy signs and symptoms as well as paresthesias  - Start zinc 15mg  daily 3.  Paresthesias of the upper extremities - new problem  - Will check MRI cervical spine to look for stenosis  - EMG of the arms  - May  also be due to zinc deficiency 4. Generalized deconditioning due to malnutrition  - Start physical therapy and occupational therapy for weakness  - Encouraged to eat a well-balanced diet 5.  Return to clinic 58-months   The duration of this appointment visit was 30 minutes of face-to-face time with the patient.  Greater than 50% of this time was spent in counseling, explanation of diagnosis, planning of further management, and coordination of care.   Thank you for allowing me to participate in patient's care.  If I can answer any additional questions, I would be pleased to do so.    Sincerely,    Donika K. Posey Pronto, DO

## 2013-05-24 NOTE — Patient Instructions (Addendum)
1.  EMG of the upper extremities 2.  MRI of the cervical spine 3.  Continue neurontin 300mg  twice daily 4.  Start taking zinc 15mg  daily 5.  Physical therapy and occupational therapy for weakness 6.  Encouraged to eat a well-balanced diet 7.  Return to clinic 52-months

## 2013-06-03 ENCOUNTER — Encounter: Payer: Self-pay | Admitting: Neurology

## 2013-06-05 ENCOUNTER — Ambulatory Visit (HOSPITAL_COMMUNITY)
Admission: RE | Admit: 2013-06-05 | Discharge: 2013-06-05 | Disposition: A | Discharge status: Home / Self Care | Payer: Federal, State, Local not specified - PPO | Source: Ambulatory Visit | Attending: Neurology | Admitting: Neurology

## 2013-06-05 DIAGNOSIS — M542 Cervicalgia: Secondary | ICD-10-CM | POA: Insufficient documentation

## 2013-06-05 DIAGNOSIS — M79609 Pain in unspecified limb: Secondary | ICD-10-CM | POA: Insufficient documentation

## 2013-06-05 DIAGNOSIS — M5030 Other cervical disc degeneration, unspecified cervical region: Secondary | ICD-10-CM | POA: Insufficient documentation

## 2013-06-05 DIAGNOSIS — M5417 Radiculopathy, lumbosacral region: Secondary | ICD-10-CM

## 2013-06-05 DIAGNOSIS — R531 Weakness: Secondary | ICD-10-CM

## 2013-06-05 DIAGNOSIS — R292 Abnormal reflex: Secondary | ICD-10-CM

## 2013-06-05 DIAGNOSIS — R202 Paresthesia of skin: Secondary | ICD-10-CM

## 2013-06-05 DIAGNOSIS — R269 Unspecified abnormalities of gait and mobility: Secondary | ICD-10-CM

## 2013-06-06 ENCOUNTER — Telehealth: Payer: Self-pay | Admitting: Neurology

## 2013-06-06 NOTE — Telephone Encounter (Signed)
[phone] OR [phone]  Pt returning your call

## 2013-06-06 NOTE — Telephone Encounter (Signed)
LM for pt that MRI was ok.

## 2013-06-07 ENCOUNTER — Ambulatory Visit: Payer: Federal, State, Local not specified - PPO | Admitting: Neurology

## 2013-06-11 ENCOUNTER — Encounter (INDEPENDENT_AMBULATORY_CARE_PROVIDER_SITE_OTHER): Payer: Federal, State, Local not specified - PPO | Admitting: Surgery

## 2013-06-17 ENCOUNTER — Emergency Department (HOSPITAL_COMMUNITY)
Admission: EM | Admit: 2013-06-17 | Discharge: 2013-06-18 | Disposition: A | Discharge status: Other Acute Inpatient Hospital | Payer: Federal, State, Local not specified - PPO | Attending: Emergency Medicine | Admitting: Emergency Medicine

## 2013-06-17 ENCOUNTER — Encounter (HOSPITAL_COMMUNITY): Payer: Self-pay | Admitting: Emergency Medicine

## 2013-06-17 DIAGNOSIS — E785 Hyperlipidemia, unspecified: Secondary | ICD-10-CM | POA: Insufficient documentation

## 2013-06-17 DIAGNOSIS — F3289 Other specified depressive episodes: Secondary | ICD-10-CM | POA: Insufficient documentation

## 2013-06-17 DIAGNOSIS — F32A Depression, unspecified: Secondary | ICD-10-CM

## 2013-06-17 DIAGNOSIS — Z7982 Long term (current) use of aspirin: Secondary | ICD-10-CM | POA: Insufficient documentation

## 2013-06-17 DIAGNOSIS — F411 Generalized anxiety disorder: Secondary | ICD-10-CM | POA: Insufficient documentation

## 2013-06-17 DIAGNOSIS — F419 Anxiety disorder, unspecified: Secondary | ICD-10-CM

## 2013-06-17 DIAGNOSIS — F1010 Alcohol abuse, uncomplicated: Secondary | ICD-10-CM | POA: Insufficient documentation

## 2013-06-17 DIAGNOSIS — F10939 Alcohol use, unspecified with withdrawal, unspecified: Secondary | ICD-10-CM

## 2013-06-17 DIAGNOSIS — R209 Unspecified disturbances of skin sensation: Secondary | ICD-10-CM | POA: Insufficient documentation

## 2013-06-17 DIAGNOSIS — F10239 Alcohol dependence with withdrawal, unspecified: Secondary | ICD-10-CM | POA: Insufficient documentation

## 2013-06-17 DIAGNOSIS — F329 Major depressive disorder, single episode, unspecified: Secondary | ICD-10-CM | POA: Insufficient documentation

## 2013-06-17 DIAGNOSIS — M810 Age-related osteoporosis without current pathological fracture: Secondary | ICD-10-CM | POA: Insufficient documentation

## 2013-06-17 DIAGNOSIS — Z780 Asymptomatic menopausal state: Secondary | ICD-10-CM | POA: Insufficient documentation

## 2013-06-17 DIAGNOSIS — G43909 Migraine, unspecified, not intractable, without status migrainosus: Secondary | ICD-10-CM | POA: Insufficient documentation

## 2013-06-17 DIAGNOSIS — I498 Other specified cardiac arrhythmias: Secondary | ICD-10-CM | POA: Insufficient documentation

## 2013-06-17 DIAGNOSIS — Z79899 Other long term (current) drug therapy: Secondary | ICD-10-CM | POA: Insufficient documentation

## 2013-06-17 LAB — ETHANOL: Alcohol, Ethyl (B): 117 mg/dL — ABNORMAL HIGH (ref 0–11)

## 2013-06-17 LAB — RAPID URINE DRUG SCREEN, HOSP PERFORMED
Amphetamines: NOT DETECTED
Barbiturates: NOT DETECTED
Benzodiazepines: NOT DETECTED
Cocaine: NOT DETECTED
Opiates: NOT DETECTED
Tetrahydrocannabinol: NOT DETECTED

## 2013-06-17 LAB — COMPREHENSIVE METABOLIC PANEL
ALT: 31 U/L (ref 0–35)
AST: 131 U/L — ABNORMAL HIGH (ref 0–37)
Albumin: 4.7 g/dL (ref 3.5–5.2)
Alkaline Phosphatase: 117 U/L (ref 39–117)
Chloride: 98 mEq/L (ref 96–112)
Creatinine, Ser: 0.5 mg/dL (ref 0.50–1.10)
GFR calc non Af Amer: >90 mL/min (ref >90)
Potassium: 3.9 mEq/L (ref 3.7–5.3)
Sodium: 141 mEq/L (ref 137–147)
Total Bilirubin: 0.3 mg/dL (ref 0.3–1.2)
Total Protein: 8.1 g/dL (ref 6.0–8.3)

## 2013-06-17 LAB — CBC
HCT: 37.8 % (ref 36.0–46.0)
Hemoglobin: 13.1 g/dL (ref 12.0–15.0)
MCH: 31.7 pg (ref 26.0–34.0)
MCHC: 34.7 g/dL (ref 30.0–36.0)
MCV: 91.5 fL (ref 78.0–100.0)
Platelets: 123 10*3/uL — ABNORMAL LOW (ref 150–400)
RBC: 4.13 MIL/uL (ref 3.87–5.11)
RDW: 13.7 % (ref 11.5–15.5)
WBC: 5.4 10*3/uL (ref 4.0–10.5)

## 2013-06-17 LAB — COMPREHENSIVE METABOLIC PANEL WITH GFR
BUN: 12 mg/dL (ref 6–23)
CO2: 24 meq/L (ref 19–32)
Calcium: 10.1 mg/dL (ref 8.4–10.5)
GFR calc Af Amer: >90 mL/min (ref >90)
Glucose, Bld: 165 mg/dL — ABNORMAL HIGH (ref 70–99)

## 2013-06-17 MED ORDER — ONDANSETRON HCL 4 MG PO TABS: 4.0000 mg | ORAL_TABLET | Freq: Three times a day (TID) | ORAL | Status: DC | PRN

## 2013-06-17 MED ORDER — LORAZEPAM 1 MG PO TABS: 0.0000 mg | ORAL_TABLET | Freq: Two times a day (BID) | ORAL | Status: DC

## 2013-06-17 MED ORDER — LORAZEPAM 2 MG/ML IJ SOLN
2.0000 mg | Freq: Once | INTRAMUSCULAR | Status: AC
  Administered 2013-06-17: 2 mg via INTRAMUSCULAR
  Filled 2013-06-17: qty 1

## 2013-06-17 MED ORDER — LORAZEPAM 1 MG PO TABS
0.0000 mg | ORAL_TABLET | Freq: Four times a day (QID) | ORAL | Status: DC
  Filled 2013-06-17: qty 1

## 2013-06-17 MED ORDER — ACETAMINOPHEN 325 MG PO TABS: 650.0000 mg | ORAL_TABLET | ORAL | Status: DC | PRN

## 2013-06-17 MED ORDER — VITAMIN B-1 100 MG PO TABS
100.0000 mg | ORAL_TABLET | Freq: Every day | ORAL | Status: DC
  Administered 2013-06-17: 100 mg via ORAL
  Filled 2013-06-17: qty 1

## 2013-06-17 MED ORDER — THIAMINE HCL 100 MG/ML IJ SOLN
100.0000 mg | Freq: Every day | INTRAMUSCULAR | Status: DC
Start: 2013-06-17 — End: 2013-06-18

## 2013-06-17 NOTE — ED Notes (Signed)
Pt with elevated heart rate.  Pt states heart rate always elevated above 100.  Pt takes meds for elevated heart rate.  Pt very anxious on assessment.  Denies any palpitations or chest discomfort/changes.

## 2013-06-17 NOTE — ED Provider Notes (Signed)
CSN: [id number]     Arrival date & time 06/17/13  1617 History   First MD Initiated Contact with Patient 06/17/13 1940     Chief Complaint  Patient presents with  . Medical Clearance  . detox      (Consider location/radiation/quality/duration/timing/severity/associated sxs/prior Treatment) HPI Comments: PT drinks wine every day, also take xanax once a day.  Pt began drinking again after being at home, having a new unusual neuropathy that she is being evaluated by neurology without definitive diagnosis.  She began drinking again thinking it would improve symptoms.  She has problems with body image, depression, anxiety, unable to take care of things at home, saw a new psychologist today who recommended that she come to the ED for being admitted.  No SI, HI, hallucinations, seizures.  No prior history of these.  Pt is very anxious, is feeling more and more shaky today. Has not had xanax today.    Patient is a 58 y.o. female presenting with mental health disorder. The history is provided by the patient, a friend and medical records.  Mental Health Problem Presenting symptoms: depression   Presenting symptoms: no suicidal thoughts, no suicidal threats and no suicide attempt   Patient accompanied by:  Friend Degree of incapacity (severity):  Severe Onset quality:  Gradual Timing:  Constant Progression:  Worsening Chronicity:  Chronic Context: alcohol use and medication   Context: not noncompliant   Treatment compliance:  Most of the time Relieved by:  Nothing Ineffective treatments:  Antidepressants Associated symptoms: anxiety and fatigue   Associated symptoms: no abdominal pain   Risk factors: hx of mental illness     Past Medical History  Diagnosis Date  . Low back pain   . Anxiety   . Migraines   . GOITER, MULTINODULAR 08/17/2009  . OSTEOPOROSIS 08/17/2009  . ASYMPTOMATIC POSTMENOPAUSAL STATUS 08/17/2009  . DYSPHAGIA 06/19/2007  . Supraventricular tachycardia   . Hyperlipidemia    . Mitral valve prolapse   . PONV (postoperative nausea and vomiting)    Past Surgical History  Procedure Laterality Date  . Laporoscopic abdominal surgery    . Cervix surgery    . Cholecystectomy N/A 03/14/2013    Procedure: LAPAROSCOPIC CHOLECYSTECTOMY WITH ATTEMPTED INTRAOPERATIVE CHOLANGIOGRAM;  Surgeon: Harl Bowie, MD;  Location: [place];  Service: General;  Laterality: N/A;   Family History  Problem Relation Age of Onset  . Thyroid disease Neg Hx   . Goiter Neg Hx   . Colon cancer Father     Deceased, 72  . Osteoporosis Mother     Living, 66  . Healthy Brother    History  Substance Use Topics  . Smoking status: Never Smoker   . Smokeless tobacco: Never Used  . Alcohol Use: Yes   OB History   Grav Para Term Preterm Abortions TAB SAB Ect Mult Living                 Review of Systems  Constitutional: Positive for diaphoresis and fatigue. Negative for fever and chills.  Gastrointestinal: Positive for nausea. Negative for abdominal pain and diarrhea.  Musculoskeletal: Positive for arthralgias.  Skin: Negative for rash.  Neurological: Positive for numbness.  Psychiatric/Behavioral: Negative for suicidal ideas. The patient is nervous/anxious.   All other systems reviewed and are negative.     Allergies  Codeine; Doxycycline; and Tetracycline hcl  Home Medications   Prior to Admission medications   Medication Sig Start Date End Date Taking? Authorizing Provider  alendronate (FOSAMAX) 70  MG tablet Take 1 tablet (70 mg total) by mouth every 7 (seven) days. Take with a full glass of water on an empty stomach. 10/04/12  Yes Denita Lung, MD  ALPRAZolam Duanne Moron) 0.5 MG tablet TAKE 1 TABLET BY MOUTH EVERY DAY AS NEEDED FOR ANXIETY 04/26/13  Yes Denita Lung, MD  aspirin 81 MG tablet Take 81 mg by mouth daily.     Yes Historical Provider, MD  calcium carbonate 200 MG capsule Take 200 mg by mouth daily.    Yes Historical Provider, MD  cholecalciferol (VITAMIN D) 1000  UNITS tablet Take 1,000 Units by mouth daily.     Yes Historical Provider, MD  diltiazem (CARDIZEM CD) 240 MG 24 hr capsule Take 1 capsule (240 mg total) by mouth daily. 10/04/12 10/04/13 Yes Denita Lung, MD  gabapentin (NEURONTIN) 100 MG capsule Take 1 capsule (100 mg total) by mouth 3 (three) times daily. 03/26/13  Yes Harl Bowie, MD  glucosamine-chondroitin 500-400 MG tablet Take 1 tablet by mouth daily.    Yes Historical Provider, MD  metoCLOPramide (REGLAN) 10 MG tablet Take 10 mg by mouth 3 (three) times daily before meals.  12/31/12  Yes Historical Provider, MD  norethindrone-ethinyl estradiol (FEMHRT 1/5) 1-5 MG-MCG TABS Take 1 tablet by mouth daily.    Yes Historical Provider, MD  pantoprazole (PROTONIX) 40 MG tablet Take 1 tablet (40 mg total) by mouth daily. 10/04/12  Yes Denita Lung, MD  simvastatin (ZOCOR) 20 MG tablet Take 1 tablet (20 mg total) by mouth at bedtime. 10/04/12  Yes Denita Lung, MD  SUMAtriptan (IMITREX) 100 MG tablet Take 100 mg by mouth every 2 (two) hours as needed for migraine or headache. May repeat in 2 hours if headache persists or recurs.   Yes Historical Provider, MD   BP 166/75  Pulse 111  Temp(Src) 98.3 F (36.8 C) (Oral)  Resp 18  SpO2 100% Physical Exam  Nursing note and vitals reviewed. Constitutional: She appears well-developed and well-nourished.  HENT:  Head: Normocephalic and atraumatic.  Eyes: EOM are normal. No scleral icterus.  Neck: Normal range of motion. Neck supple. No JVD present.  Cardiovascular: Regular rhythm.  Tachycardia present.   Pulmonary/Chest: Effort normal. No stridor.  Abdominal: Soft. She exhibits no distension. There is no tenderness.  Neurological: She is alert.  Skin: Skin is warm.  Psychiatric: Her mood appears anxious. Her affect is not angry, not blunt, not labile and not inappropriate. Her speech is not rapid and/or pressured and not slurred. She is slowed. She is not agitated, not hyperactive, not actively  hallucinating and not combative. Thought content is not delusional. Cognition and memory are not impaired. She does not express impulsivity or inappropriate judgment. She exhibits a depressed mood. She expresses no suicidal ideation. She expresses no suicidal plans and no homicidal plans. She exhibits normal recent memory.    ED Course  Procedures (including critical care time) Labs Review Labs Reviewed  COMPREHENSIVE METABOLIC PANEL - Abnormal; Notable for the following:    Glucose, Bld 165 (*)    AST 131 (*)    All other components within normal limits  ETHANOL - Abnormal; Notable for the following:    Alcohol, Ethyl (B) 117 (*)    All other components within normal limits  CBC - Abnormal; Notable for the following:    Platelets 123 (*)    All other components within normal limits  URINE RAPID DRUG SCREEN (HOSP PERFORMED)    Imaging Review  No results found.   EKG Interpretation None     RA sat is 96% and I interpret to be adequate  10:29 PM Pt accepted at [facility] by Dr. Lamar Benes   MDM   Final diagnoses:  Alcohol withdrawal  Depression  Anxiety    Pt with acute alcohol withdrawal symptoms, exacerbating anxiety and some depression.  No SI, HI, hallucinations, no evidence of acute DT's, last drinking was yesterday.  Will give ativan, check CIWA and check routine labs.  Pt has had problems with fatty liver in the past, was admitted in January after having GB removed.  Pt is agreeable to inaptient services here.  Will check for availability at psych.  If not, will consult hospitalist for admission given acute physical symptoms.      Saddie Benders. Dorna Mai, MD 06/17/13 2229

## 2013-06-17 NOTE — ED Notes (Signed)
Per pt, has depression/anxiety/substance abuse.  Pt denies SI/HI.  Pt wants help with the depression and anxiety.  Last drink this morning.  Pt states she drinks daily.

## 2013-06-17 NOTE — BH Assessment (Signed)
Assessment Note  Terri Ayala is an 58 y.o. female who presents with increased alcohol and depression. She reports she was hospitalized for two weeks after complications from a gallbladder surgery where they learned she was malnourished and had liver problems.  WHile there she woke up with neuropathy she had not had before, and despite committing to stop drinking to live to see her retirement, she could not cope with the pain and so she began drinking again and reports drinking at least a bottle of wine daily for the last two weeks after detoxing during her hospitalization.  She began drinking during childhood because she came from a family that drank heavily.  She has never attempted detoxing before, but has gone to counseling and just started with a new therapist today, who suggested she come here.  SHe reports being stressed because of her job and her health and having very minimal support in town.  She is unsure of what to do next because she needs to stop drinking for her health and mental health, but is concerned about missing more.  She denies HI or SI, now or in the last six months, but admits she can barely get to work due to her depression and pain.  She denies AVH.  She does endorse daily panic attacks for which she takes 1mg  of xanax daily, which reduces them to approximately 3 times per week.  She is appropriate for inpatient detox and crisis stabilzation.    Axis I: Depressive Disorder NOS and Alcohol Dependence Generalized Anxiety Disorder Axis II: Deferred Axis III:  Past Medical History  Diagnosis Date  . Low back pain   . Anxiety   . Migraines   . GOITER, MULTINODULAR 08/17/2009  . OSTEOPOROSIS 08/17/2009  . ASYMPTOMATIC POSTMENOPAUSAL STATUS 08/17/2009  . DYSPHAGIA 06/19/2007  . Supraventricular tachycardia   . Hyperlipidemia   . Mitral valve prolapse   . PONV (postoperative nausea and vomiting)    Axis IV: economic problems, occupational problems and problems with access to  health care services Axis V: 41-50 serious symptoms  Past Medical History:  Past Medical History  Diagnosis Date  . Low back pain   . Anxiety   . Migraines   . GOITER, MULTINODULAR 08/17/2009  . OSTEOPOROSIS 08/17/2009  . ASYMPTOMATIC POSTMENOPAUSAL STATUS 08/17/2009  . DYSPHAGIA 06/19/2007  . Supraventricular tachycardia   . Hyperlipidemia   . Mitral valve prolapse   . PONV (postoperative nausea and vomiting)     Past Surgical History  Procedure Laterality Date  . Laporoscopic abdominal surgery    . Cervix surgery    . Cholecystectomy N/A 03/14/2013    Procedure: LAPAROSCOPIC CHOLECYSTECTOMY WITH ATTEMPTED INTRAOPERATIVE CHOLANGIOGRAM;  Surgeon: Harl Bowie, MD;  Location: [place];  Service: General;  Laterality: N/A;    Family History:  Family History  Problem Relation Age of Onset  . Thyroid disease Neg Hx   . Goiter Neg Hx   . Colon cancer Father     Deceased, 73  . Osteoporosis Mother     Living, 38  . Healthy Brother     Social History:  reports that she has never smoked. She has never used smokeless tobacco. She reports that she drinks alcohol. She reports that she does not use illicit drugs.  Additional Social History:  Alcohol / Drug Use History of alcohol / drug use?: Yes Substance #1 Name of Substance 1: Alcohol 1 - Age of First Use: childhood 1 - Amount (size/oz): bottle of wine 1 -  Frequency: daily 1 - Duration: 2 weeks 1 - Last Use / Amount: 06/17/13 unsure  CIWA: CIWA-Ar BP: 123/79 mmHg Pulse Rate: 120 Nausea and Vomiting: no nausea and no vomiting COWS:    Allergies:  Allergies  Allergen Reactions  . Codeine Itching  . Doxycycline Other (See Comments)    Reaction unknown  . Tetracycline Hcl Other (See Comments)    Reaction unknown    Home Medications:  (Not in a hospital admission)  OB/GYN Status:  No LMP recorded. Patient is postmenopausal.  General Assessment Data Location of Assessment: [facility] ED Is this a Tele or Face-to-Face  Assessment?: Face-to-Face Is this an Initial Assessment or a Re-assessment for this encounter?: Initial Assessment Living Arrangements: Alone Can pt return to current living arrangement?: Yes Admission Status: Voluntary Is patient capable of signing voluntary admission?: Yes Transfer from: [facility] Referral Source: Self/Family/Friend     [place] Living Arrangements: Alone Name of Therapist: Sima Matas  Education Status Is patient currently in school?: No Highest grade of school patient has completed: some graduate school  Risk to self Suicidal Ideation: No Suicidal Intent: No Is patient at risk for suicide?: No Suicidal Plan?: No Access to Means: No What has been your use of drugs/alcohol within the last 12 months?: ongoing Previous Attempts/Gestures: No Intentional Self Injurious Behavior: None Family Suicide History: No Recent stressful life event(s): Recent negative physical changes (long hospitalization after gallbladder complications, work) Persecutory voices/beliefs?: No Depression: Yes Depression Symptoms: Isolating;Feeling worthless/self pity;Loss of interest in usual pleasures;Guilt;Fatigue;Insomnia;Despondent Substance abuse history and/or treatment for substance abuse?: Yes Suicide prevention information given to non-admitted patients: Not applicable  Risk to Others Homicidal Ideation: No Thoughts of Harm to Others: No Current Homicidal Intent: No Current Homicidal Plan: No Access to Homicidal Means: No History of harm to others?: No Assessment of Violence: None Noted Does patient have access to weapons?: No Criminal Charges Pending?: No Does patient have a court date: No  Psychosis Hallucinations: None noted Delusions: None noted  Mental Status Report Appear/Hygiene: Disheveled Eye Contact: Good Motor Activity: Freedom of movement Speech: Logical/coherent Level of Consciousness: Alert Mood: Depressed Affect: Appropriate to  circumstance;Anxious Anxiety Level: Panic Attacks Panic attack frequency: few weekly Most recent panic attack: today Thought Processes: Coherent;Relevant Judgement: Unimpaired Orientation: Person;Place;Time;Situation Obsessive Compulsive Thoughts/Behaviors: Moderate  Cognitive Functioning Concentration: Decreased Memory: Recent Impaired IQ: Average Insight: Good Impulse Control: Good Appetite: Poor Weight Loss: 20 Weight Gain: 0 Sleep: Decreased Total Hours of Sleep: 6 Vegetative Symptoms: Staying in bed;Decreased grooming  ADLScreening [facility]) Patient's cognitive ability adequate to safely complete daily activities?: Yes Patient able to express need for assistance with ADLs?: Yes Independently performs ADLs?: Yes (appropriate for developmental age)  Prior Inpatient Therapy Prior Inpatient Therapy: No  Prior Outpatient Therapy Prior Outpatient Therapy: Yes Prior Therapy Dates: 2015, 2007 Prior Therapy Facilty/Provider(s): [facility]-just started today, can't remember other therapist Reason for Treatment: depresison, anxiety  ADL Screening (condition at time of admission) Patient's cognitive ability adequate to safely complete daily activities?: Yes Patient able to express need for assistance with ADLs?: Yes Independently performs ADLs?: Yes (appropriate for developmental age)       Abuse/Neglect Assessment (Assessment to be complete while patient is alone) Physical Abuse: Denies Verbal Abuse: Denies Sexual Abuse: Denies Values / Beliefs Cultural Requests During Hospitalization: None Spiritual Requests During Hospitalization: None   Advance Directives (For Healthcare) Advance Directive: Patient does not have advance directive;Patient would not like information Nutrition Screen- [facility] Patient's home diet:  Regular  Additional Information 1:1 In Past 12 Months?: No CIRT Risk: No Elopement Risk: No Does patient have medical  clearance?: Yes     Disposition:  Disposition Initial Assessment Completed for this Encounter: Yes Disposition of Patient: Inpatient treatment program Type of inpatient treatment program: Adult  On Site Evaluation by:   Reviewed with Physician:    Jesus Genera Lomax 06/17/2013 9:13 PM

## 2013-06-18 ENCOUNTER — Inpatient Hospital Stay (HOSPITAL_COMMUNITY)
Admission: EM | Admit: 2013-06-18 | Discharge: 2013-06-21 | DRG: 897 | Disposition: A | Discharge status: Home / Self Care | Payer: Federal, State, Local not specified - PPO | Source: Intra-hospital | Attending: Psychiatry | Admitting: Psychiatry

## 2013-06-18 ENCOUNTER — Encounter (HOSPITAL_COMMUNITY): Payer: Self-pay | Admitting: *Deleted

## 2013-06-18 DIAGNOSIS — F331 Major depressive disorder, recurrent, moderate: Secondary | ICD-10-CM

## 2013-06-18 DIAGNOSIS — F329 Major depressive disorder, single episode, unspecified: Secondary | ICD-10-CM

## 2013-06-18 DIAGNOSIS — E785 Hyperlipidemia, unspecified: Secondary | ICD-10-CM | POA: Diagnosis present

## 2013-06-18 DIAGNOSIS — F411 Generalized anxiety disorder: Secondary | ICD-10-CM | POA: Diagnosis present

## 2013-06-18 DIAGNOSIS — Z800 Family history of malignant neoplasm of digestive organs: Secondary | ICD-10-CM

## 2013-06-18 DIAGNOSIS — R1319 Other dysphagia: Secondary | ICD-10-CM

## 2013-06-18 DIAGNOSIS — K219 Gastro-esophageal reflux disease without esophagitis: Secondary | ICD-10-CM | POA: Diagnosis present

## 2013-06-18 DIAGNOSIS — I10 Essential (primary) hypertension: Secondary | ICD-10-CM | POA: Diagnosis present

## 2013-06-18 DIAGNOSIS — M810 Age-related osteoporosis without current pathological fracture: Secondary | ICD-10-CM

## 2013-06-18 DIAGNOSIS — F1020 Alcohol dependence, uncomplicated: Principal | ICD-10-CM | POA: Diagnosis present

## 2013-06-18 DIAGNOSIS — F1010 Alcohol abuse, uncomplicated: Secondary | ICD-10-CM

## 2013-06-18 DIAGNOSIS — E559 Vitamin D deficiency, unspecified: Secondary | ICD-10-CM | POA: Diagnosis present

## 2013-06-18 DIAGNOSIS — Z8262 Family history of osteoporosis: Secondary | ICD-10-CM

## 2013-06-18 MED ORDER — SIMVASTATIN 20 MG PO TABS
20.0000 mg | ORAL_TABLET | Freq: Every day | ORAL | Status: DC
  Administered 2013-06-18 – 2013-06-20 (×3): 20 mg via ORAL
  Filled 2013-06-18 (×7): qty 1

## 2013-06-18 MED ORDER — METOCLOPRAMIDE HCL 10 MG PO TABS
10.0000 mg | ORAL_TABLET | Freq: Three times a day (TID) | ORAL | Status: DC
  Administered 2013-06-18 – 2013-06-21 (×11): 10 mg via ORAL
  Filled 2013-06-18 (×9): qty 1
  Filled 2013-06-18: qty 2
  Filled 2013-06-18 (×7): qty 1

## 2013-06-18 MED ORDER — VITAMIN B-1 100 MG PO TABS
100.0000 mg | ORAL_TABLET | Freq: Every day | ORAL | Status: DC
  Administered 2013-06-19 – 2013-06-21 (×3): 100 mg via ORAL
  Filled 2013-06-18 (×5): qty 1

## 2013-06-18 MED ORDER — SUMATRIPTAN SUCCINATE 100 MG PO TABS: 100.0000 mg | ORAL_TABLET | ORAL | Status: DC | PRN

## 2013-06-18 MED ORDER — HYDROXYZINE HCL 25 MG PO TABS: 25.0000 mg | ORAL_TABLET | Freq: Four times a day (QID) | ORAL | Status: AC | PRN

## 2013-06-18 MED ORDER — GABAPENTIN 100 MG PO CAPS
200.0000 mg | ORAL_CAPSULE | Freq: Three times a day (TID) | ORAL | Status: DC
  Administered 2013-06-19 – 2013-06-21 (×8): 200 mg via ORAL
  Filled 2013-06-18 (×14): qty 2

## 2013-06-18 MED ORDER — CHLORDIAZEPOXIDE HCL 25 MG PO CAPS
25.0000 mg | ORAL_CAPSULE | Freq: Four times a day (QID) | ORAL | Status: AC
  Administered 2013-06-18 – 2013-06-19 (×6): 25 mg via ORAL
  Filled 2013-06-18 (×6): qty 1

## 2013-06-18 MED ORDER — DULOXETINE HCL 20 MG PO CPEP
20.0000 mg | ORAL_CAPSULE | Freq: Every day | ORAL | Status: DC
  Administered 2013-06-18 – 2013-06-21 (×4): 20 mg via ORAL
  Filled 2013-06-18 (×8): qty 1

## 2013-06-18 MED ORDER — BOOST / RESOURCE BREEZE PO LIQD
1.0000 | Freq: Three times a day (TID) | ORAL | Status: DC
  Administered 2013-06-18 – 2013-06-21 (×8): 1 via ORAL
  Filled 2013-06-18 (×15): qty 1

## 2013-06-18 MED ORDER — CHLORDIAZEPOXIDE HCL 25 MG PO CAPS
25.0000 mg | ORAL_CAPSULE | Freq: Three times a day (TID) | ORAL | Status: AC
  Administered 2013-06-19 – 2013-06-20 (×3): 25 mg via ORAL
  Filled 2013-06-18 (×2): qty 1

## 2013-06-18 MED ORDER — CHLORDIAZEPOXIDE HCL 25 MG PO CAPS: 25.0000 mg | ORAL_CAPSULE | Freq: Every day | ORAL | Status: DC

## 2013-06-18 MED ORDER — PANTOPRAZOLE SODIUM 40 MG PO TBEC
40.0000 mg | DELAYED_RELEASE_TABLET | Freq: Every day | ORAL | Status: DC
  Administered 2013-06-18 – 2013-06-21 (×4): 40 mg via ORAL
  Filled 2013-06-18 (×6): qty 1

## 2013-06-18 MED ORDER — ALENDRONATE SODIUM 70 MG PO TABS: 70.0000 mg | ORAL_TABLET | ORAL | Status: DC

## 2013-06-18 MED ORDER — VITAMIN D3 25 MCG (1000 UNIT) PO TABS
1000.0000 [IU] | ORAL_TABLET | Freq: Every day | ORAL | Status: DC
  Administered 2013-06-18 – 2013-06-21 (×4): 1000 [IU] via ORAL
  Filled 2013-06-18 (×6): qty 1

## 2013-06-18 MED ORDER — THIAMINE HCL 100 MG/ML IJ SOLN: 100.0000 mg | Freq: Once | INTRAMUSCULAR | Status: DC

## 2013-06-18 MED ORDER — GABAPENTIN 100 MG PO CAPS
100.0000 mg | ORAL_CAPSULE | Freq: Three times a day (TID) | ORAL | Status: DC
  Administered 2013-06-18 (×3): 100 mg via ORAL
  Filled 2013-06-18 (×7): qty 1

## 2013-06-18 MED ORDER — CALCIUM CARBONATE ANTACID 500 MG PO CHEW
500.0000 mg | CHEWABLE_TABLET | Freq: Every day | ORAL | Status: DC
  Administered 2013-06-18 – 2013-06-21 (×4): 500 mg via ORAL
  Filled 2013-06-18 (×6): qty 1

## 2013-06-18 MED ORDER — NORETHINDRONE-ETH ESTRADIOL 1-5 MG-MCG PO TABS
1.0000 | ORAL_TABLET | Freq: Every day | ORAL | Status: DC
  Filled 2013-06-18: qty 1

## 2013-06-18 MED ORDER — TRAZODONE HCL 50 MG PO TABS
50.0000 mg | ORAL_TABLET | Freq: Every evening | ORAL | Status: DC | PRN
  Administered 2013-06-18 – 2013-06-20 (×4): 50 mg via ORAL
  Filled 2013-06-18 (×12): qty 1

## 2013-06-18 MED ORDER — DILTIAZEM HCL ER COATED BEADS 240 MG PO CP24
240.0000 mg | ORAL_CAPSULE | Freq: Every day | ORAL | Status: DC
  Administered 2013-06-18 – 2013-06-21 (×4): 240 mg via ORAL
  Filled 2013-06-18 (×6): qty 1

## 2013-06-18 MED ORDER — CHLORDIAZEPOXIDE HCL 25 MG PO CAPS
25.0000 mg | ORAL_CAPSULE | ORAL | Status: AC
  Administered 2013-06-20 – 2013-06-21 (×2): 25 mg via ORAL
  Filled 2013-06-18 (×2): qty 1

## 2013-06-18 MED ORDER — ONDANSETRON 4 MG PO TBDP: 4.0000 mg | ORAL_TABLET | Freq: Four times a day (QID) | ORAL | Status: AC | PRN

## 2013-06-18 MED ORDER — CHLORDIAZEPOXIDE HCL 25 MG PO CAPS: 25.0000 mg | ORAL_CAPSULE | Freq: Four times a day (QID) | ORAL | Status: AC | PRN

## 2013-06-18 MED ORDER — ADULT MULTIVITAMIN W/MINERALS CH
1.0000 | ORAL_TABLET | Freq: Every day | ORAL | Status: DC
  Administered 2013-06-18 – 2013-06-21 (×4): 1 via ORAL
  Filled 2013-06-18 (×6): qty 1

## 2013-06-18 MED ORDER — LOPERAMIDE HCL 2 MG PO CAPS: 2.0000 mg | ORAL_CAPSULE | ORAL | Status: AC | PRN

## 2013-06-18 MED ORDER — ASPIRIN EC 81 MG PO TBEC
81.0000 mg | DELAYED_RELEASE_TABLET | Freq: Every day | ORAL | Status: DC
  Administered 2013-06-18 – 2013-06-21 (×4): 81 mg via ORAL
  Filled 2013-06-18 (×6): qty 1

## 2013-06-18 NOTE — Progress Notes (Signed)
NUTRITION ASSESSMENT  Pt identified as at risk on the Malnutrition Screen Tool Patient continues to meet the criteria for Severe Malnutrition related to social/environmental cause AEB a 19% weight loss in the past 3 months, intake < 50% of needs for > 1 month, and obvious depletion of muscle mass.  INTERVENTION: 1. Educated patient on the importance of nutrition and encouraged intake of food and beverages.  Discussed good sources of zinc.  Provided patient handout on Sobriety and Nutrition.  Number for the [facility] provided.  Encouraged patient to make an appointment. 2. Discussed weight goals. 3. Supplements: Resource [name] po TID, each supplement provides 250 kcal and 9 grams of protein MVI, thiamine, Vitamin D daily.    NUTRITION DIAGNOSIS: Unintentional weight loss related to sub-optimal intake as evidenced by pt report.   Goal: Pt to meet >/= 90% of their estimated nutrition needs.  Monitor:  PO intake  Assessment:  Patient admitted with depression and etoh abuse (1 bottle of wine daily).  Vitamin B-6, Vitamin B-12, copper and zinc were checked.  Zinc was low.  Other labs normal.    Patient admitted in January and diagnosed with severe malnutrition at that time due to muscle wasting, decreased body fat and poor intake for > 1 month.   Patient had her gallbladder removed and was on TPN at that time.    Patient is now 24 lbs less than January admit.  States that appetite has been very poor,  Fair intake currently.    "I didn't eat much at home."  Almonds in the am, Sandwich for lunch then go home and usually would not eat dinner.  Patient is lactose intolerant.  Does not tolerate Ensure or Boost.as well.    Patient continues to meet criteria for severe malnutrition.  58 y.o. female  Height: Ht Readings from Last 1 Encounters:  06/18/13 5' (1.524 m)    Weight: Wt Readings from Last 1 Encounters:  06/18/13 100 lb (45.36 kg)    Weight Hx: Wt  Readings from Last 10 Encounters:  06/18/13 100 lb (45.36 kg)  05/24/13 107 lb 1 oz (48.563 kg)  04/26/13 109 lb 7 oz (49.641 kg)  04/22/13 109 lb (49.442 kg)  03/14/13 124 lb (56.246 kg)  03/14/13 124 lb (56.246 kg)  03/08/13 124 lb 6.4 oz (56.427 kg)  02/13/13 123 lb (55.792 kg)  10/31/12 128 lb (58.06 kg)  10/26/12 130 lb (58.968 kg)    BMI:  Body mass index is 19.53 kg/(m^2). Pt meets criteria for normal weight based on current BMI but very thin appearing.  Estimated Nutritional Needs: Kcal: 25-30 kcal/kg Protein: > 1 gram protein/kg Fluid: 1 ml/kcal  Diet Order:   Pt is also offered choice of unit snacks mid-morning and mid-afternoon.  Pt is eating as desired.   Lab results and medications reviewed.   Antonieta Iba, RD, LDN Clinical Inpatient Dietitian Pager:  [phone] Weekend and after hours pager:  [phone]

## 2013-06-18 NOTE — [facility] Group Notes (Signed)
[place] LCSW Group Therapy      Feelings About Diagnosis 1:15 - 2:30 PM         06/18/2013    Type of Therapy:  Group Therapy  Participation Level:  Active  Participation Quality:  Appropriate  Affect: Depress  Cognitive:  Alert and Appropriate  Insight:  Developing/Improving and Engaged  Engagement in Therapy:  Developing/Improving and Engaged  Modes of Intervention:  Discussion, Education, Exploration, Problem-Solving, Rapport Building, Support  Summary of Progress/Problems:  Patient actively participated in group. Patient discussed past and present diagnosis and the effects it has had on  life.  Patient talked about family and society being judgmental and the stigma associated with having a mental health diagnosis.   Patient shared she has had a problem with depression for most of her life and understands it does not define who she is or what she can accomplish.  Terri Ayala 06/18/2013

## 2013-06-18 NOTE — ED Notes (Signed)
Pt present to TCU in wheelchair. Alert and oriented x 4.  She is very shaky and HOH.

## 2013-06-18 NOTE — Tx Team (Signed)
Initial Interdisciplinary Treatment Plan  PATIENT STRENGTHS: (choose at least two) Ability for insight Average or above average intelligence Communication skills Financial means General fund of knowledge Supportive family/friends Work skills  PATIENT STRESSORS: Medication change or noncompliance Substance abuse   PROBLEM LIST: Problem List/Patient Goals Date to be addressed Date deferred Reason deferred Estimated date of resolution  Substance abuse 06-18-13     Depression 06-18-13     H.O.H 06-18-13     HTN 06-18-13     hyperlipedemia 06-18-13     Osteoporosis 06-18-13     migraines 06-18-13                  DISCHARGE CRITERIA:  Improved stabilization in mood, thinking, and/or behavior Verbal commitment to aftercare and medication compliance Withdrawal symptoms are absent or subacute and managed without 24-hour nursing intervention  PRELIMINARY DISCHARGE PLAN: Attend 12-step recovery group Outpatient therapy Return to previous living arrangement Return to previous work or school arrangements  PATIENT/FAMIILY INVOLVEMENT: This treatment plan has been presented to and reviewed with the patient, Terri Ayala, and/or family member.  The patient and family have been given the opportunity to ask questions and make suggestions.  Zoe Lan 06/18/2013, 2:22 AM

## 2013-06-18 NOTE — H&P (Signed)
Psychiatric Admission Assessment Adult  Patient Identification:  Terri Ayala Date of Evaluation:  06/18/2013 Chief Complaint:  MDD History of Present Illness: Terri Ayala is an 58 y.o. female who presents with increased alcohol and depression. She reports she was hospitalized for two weeks after complications from a gallbladder surgery where they learned she was malnourished and had liver problems. WHile there she woke up with neuropathy she had not had before, and despite committing to stop drinking to live to see her retirement, she could not cope with the pain and so she began drinking again and reports drinking at least a bottle of wine daily for the last two weeks after detoxing during her hospitalization. She began drinking during childhood because she came from a family that drank heavily. She has never attempted detoxing before, but has gone to counseling and just started with a new therapist today, who suggested she come here.    Elements:  Location:  Adult unit. Quality:  chronic. Severity:  moderate to severe. Timing:  worsened since relapsed on alcohol 2 months ago. Duration:  has been drinking since childhood. Context:  patient has multiple health problems and minimal support. Associated Signs/Synptoms: Depression Symptoms:  depressed mood, anhedonia, psychomotor retardation, difficulty concentrating, impaired memory, (Hypo) Manic Symptoms:  denies Anxiety Symptoms:  Excessive Worry, Panic Symptoms, Psychotic Symptoms:  denies PTSD Symptoms:denies Total Time spent with patient: 30 minutes  Psychiatric Specialty Exam: Physical Exam  Psychiatric: Her speech is normal. Thought content normal. She is withdrawn. Cognition and memory are impaired. She expresses impulsivity and inappropriate judgment. She exhibits a depressed mood.  Patient is seen and the chart is reviewed. I agree with the findings of the exam done in the ED with no exceptions.    Review of Systems   Constitutional: Negative.   HENT: Negative.   Eyes: Negative.   Respiratory: Negative.   Cardiovascular: Negative.   Gastrointestinal: Negative.   Genitourinary: Negative.   Musculoskeletal: Negative.   Skin: Negative.   Neurological: Positive for tingling.  Endo/Heme/Allergies: Negative.   Psychiatric/Behavioral: Positive for depression and substance abuse.    Blood pressure 90/55, pulse 96, temperature 97.9 F (36.6 C), temperature source Oral, resp. rate 16, height 5' (1.524 m), weight 45.36 kg (100 lb).Body mass index is 19.53 kg/(m^2).  General Appearance: Disheveled  Eye Sport and exercise psychologist::  Fair  Speech:  Slow  Volume:  Decreased  Mood:  Depressed  Affect:  Flat  Thought Process:  Goal Directed  Orientation:  Full (Time, Place, and Person)  Thought Content:  NA  Suicidal Thoughts:  No  Homicidal Thoughts:  No  Memory:  poor  Judgement:  Fair  Insight:  Shallow  Psychomotor Activity:  Decreased  Concentration:  Poor  Recall:  Poor  Fund of Knowledge:Good  Language: Good  Akathisia:  No  Handed:  Right  AIMS (if indicated):     Assets:  Communication Skills Desire for Improvement Financial Resources/Insurance Housing Talents/Skills  Sleep:  Number of Hours: 3.5    Musculoskeletal: Strength & Muscle Tone: within normal limits Gait & Station: normal Patient leans: N/A  Past Psychiatric History: Diagnosis:  Hospitalizations:  Outpatient Care:  Substance Abuse Care:  Self-Mutilation:  Suicidal Attempts:  Violent Behaviors:   Past Medical History:   Past Medical History  Diagnosis Date  . Low back pain   . Anxiety   . Migraines   . GOITER, MULTINODULAR 08/17/2009  . OSTEOPOROSIS 08/17/2009  . ASYMPTOMATIC POSTMENOPAUSAL STATUS 08/17/2009  . DYSPHAGIA 06/19/2007  .  Supraventricular tachycardia   . Hyperlipidemia   . Mitral valve prolapse   . PONV (postoperative nausea and vomiting)     Allergies:   Allergies  Allergen Reactions  . Codeine Itching  .  Doxycycline Other (See Comments)    Reaction unknown  . Tetracycline Hcl Other (See Comments)    Reaction unknown   PTA Medications: Prescriptions prior to admission  Medication Sig Dispense Refill  . alendronate (FOSAMAX) 70 MG tablet Take 1 tablet (70 mg total) by mouth every 7 (seven) days. Take with a full glass of water on an empty stomach.  4 tablet  11  . ALPRAZolam (XANAX) 0.5 MG tablet TAKE 1 TABLET BY MOUTH EVERY DAY AS NEEDED FOR ANXIETY  90 tablet  0  . aspirin 81 MG tablet Take 81 mg by mouth daily.        . calcium carbonate 200 MG capsule Take 200 mg by mouth daily.       . cholecalciferol (VITAMIN D) 1000 UNITS tablet Take 1,000 Units by mouth daily.        [name] Kitchen diltiazem (CARDIZEM CD) 240 MG 24 hr capsule Take 1 capsule (240 mg total) by mouth daily.  30 capsule  11  . gabapentin (NEURONTIN) 100 MG capsule Take 1 capsule (100 mg total) by mouth 3 (three) times daily.  60 capsule  2  . glucosamine-chondroitin 500-400 MG tablet Take 1 tablet by mouth daily.       . metoCLOPramide (REGLAN) 10 MG tablet Take 10 mg by mouth 3 (three) times daily before meals.       . norethindrone-ethinyl estradiol (FEMHRT 1/5) 1-5 MG-MCG TABS Take 1 tablet by mouth daily.       . pantoprazole (PROTONIX) 40 MG tablet Take 1 tablet (40 mg total) by mouth daily.  30 tablet  11  . simvastatin (ZOCOR) 20 MG tablet Take 1 tablet (20 mg total) by mouth at bedtime.  30 tablet  11  . SUMAtriptan (IMITREX) 100 MG tablet Take 100 mg by mouth every 2 (two) hours as needed for migraine or headache. May repeat in 2 hours if headache persists or recurs.        Previous Psychotropic Medications:  Medication/Dose                 Substance Abuse History in the last 12 months:  yes  Consequences of Substance Abuse: Medical Consequences:  worsening depression,   Social History:  reports that she has never smoked. She has never used smokeless tobacco. She reports that she drinks about 2.4 ounces of  alcohol per week. She reports that she does not use illicit drugs. Additional Social History:                      Current Place of Residence:   Place of Birth:   Family Members: Marital Status:  Single Children:  Sons:  Daughters: Relationships: Education:  Dentist Problems/Performance: Religious Beliefs/Practices: History of Abuse (Emotional/Phsycial/Sexual) Ship broker History:  None. Legal History: Hobbies/Interests:  Family History:   Family History  Problem Relation Age of Onset  . Thyroid disease Neg Hx   . Goiter Neg Hx   . Colon cancer Father     Deceased, 23  . Osteoporosis Mother     Living, 40  . Healthy Brother     Results for orders placed during the hospital encounter of 06/17/13 (from the past 72 hour(s))  COMPREHENSIVE METABOLIC PANEL  Status: Abnormal   Collection Time    06/17/13  8:05 PM      Result Value Ref Range   Sodium 141  137 - 147 mEq/L   Potassium 3.9  3.7 - 5.3 mEq/L   Chloride 98  96 - 112 mEq/L   CO2 24  19 - 32 mEq/L   Glucose, Bld 165 (*) 70 - 99 mg/dL   BUN 12  6 - 23 mg/dL   Creatinine, Ser 0.50  0.50 - 1.10 mg/dL   Calcium 10.1  8.4 - 10.5 mg/dL   Total Protein 8.1  6.0 - 8.3 g/dL   Albumin 4.7  3.5 - 5.2 g/dL   AST 131 (*) 0 - 37 U/L   ALT 31  0 - 35 U/L   Alkaline Phosphatase 117  39 - 117 U/L   Total Bilirubin 0.3  0.3 - 1.2 mg/dL   GFR calc non Af Amer >90  >90 mL/min   GFR calc Af Amer >90  >90 mL/min   Comment: (NOTE)     The eGFR has been calculated using the CKD EPI equation.     This calculation has not been validated in all clinical situations.     eGFR's persistently <90 mL/min signify possible Chronic Kidney     Disease.  ETHANOL     Status: Abnormal   Collection Time    06/17/13  8:05 PM      Result Value Ref Range   Alcohol, Ethyl (B) 117 (*) 0 - 11 mg/dL   Comment:            LOWEST DETECTABLE LIMIT FOR     SERUM ALCOHOL IS 11 mg/dL     FOR MEDICAL  PURPOSES ONLY  CBC     Status: Abnormal   Collection Time    06/17/13  8:05 PM      Result Value Ref Range   WBC 5.4  4.0 - 10.5 K/uL   RBC 4.13  3.87 - 5.11 MIL/uL   Hemoglobin 13.1  12.0 - 15.0 g/dL   HCT 37.8  36.0 - 46.0 %   MCV 91.5  78.0 - 100.0 fL   MCH 31.7  26.0 - 34.0 pg   MCHC 34.7  30.0 - 36.0 g/dL   RDW 13.7  11.5 - 15.5 %   Platelets 123 (*) 150 - 400 K/uL  URINE RAPID DRUG SCREEN (HOSP PERFORMED)     Status: None   Collection Time    06/17/13 10:04 PM      Result Value Ref Range   Opiates NONE DETECTED  NONE DETECTED   Cocaine NONE DETECTED  NONE DETECTED   Benzodiazepines NONE DETECTED  NONE DETECTED   Amphetamines NONE DETECTED  NONE DETECTED   Tetrahydrocannabinol NONE DETECTED  NONE DETECTED   Barbiturates NONE DETECTED  NONE DETECTED   Comment:            DRUG SCREEN FOR MEDICAL PURPOSES     ONLY.  IF CONFIRMATION IS NEEDED     FOR ANY PURPOSE, NOTIFY LAB     WITHIN 5 DAYS.                LOWEST DETECTABLE LIMITS     FOR URINE DRUG SCREEN     Drug Class       Cutoff (ng/mL)     Amphetamine      1000     Barbiturate      200     Benzodiazepine  948     Tricyclics       546     Opiates          300     Cocaine          300     THC              50   Psychological Evaluations:  Assessment:   DSM5:  Schizophrenia Disorders:   Obsessive-Compulsive Disorders:   Trauma-Stressor Disorders:   Substance/Addictive Disorders:  Alcohol Related Disorder - Moderate (303.90) Depressive Disorders:  Major Depressive Disorder - Moderate (296.22)  AXIS I:  Alcohol dependence, MDD recurrent moderate AXIS II:  Deferred AXIS III:   Past Medical History  Diagnosis Date  . Low back pain   . Anxiety   . Migraines   . GOITER, MULTINODULAR 08/17/2009  . OSTEOPOROSIS 08/17/2009  . ASYMPTOMATIC POSTMENOPAUSAL STATUS 08/17/2009  . DYSPHAGIA 06/19/2007  . Supraventricular tachycardia   . Hyperlipidemia   . Mitral valve prolapse   . PONV (postoperative nausea and  vomiting)    AXIS IV:  problems with primary support group AXIS V:  41-50 serious symptoms  Treatment Plan/Recommendations:   1. Admit for crisis management and stabilization. 2. Medication management to reduce current symptoms to base line and improve the patient's overall level of functioning. 3. Treat health problems as indicated. 4. Develop treatment plan to decrease risk of relapse upon discharge and to reduce the need for readmission. 5. Psycho-social education regarding relapse prevention and self care. 6. Health care follow up as needed for medical problems. 7. Restart home medications where appropriate.  Treatment Plan Summary: Daily contact with patient to assess and evaluate symptoms and progress in treatment Medication management Current Medications:  Current Facility-Administered Medications  Medication Dose Route Frequency Provider Last Rate Last Dose  . aspirin EC tablet 81 mg  81 mg Oral Daily Laverle Hobby, PA-C   81 mg at 06/18/13 0848  . calcium carbonate (TUMS - dosed in mg elemental calcium) chewable tablet 500 mg  500 mg Oral Daily Laverle Hobby, PA-C   500 mg at 06/18/13 0850  . chlordiazePOXIDE (LIBRIUM) capsule 25 mg  25 mg Oral Q6H PRN Laverle Hobby, PA-C      . chlordiazePOXIDE (LIBRIUM) capsule 25 mg  25 mg Oral QID Laverle Hobby, PA-C   25 mg at 06/18/13 2114   Followed by  . [START ON 06/19/2013] chlordiazePOXIDE (LIBRIUM) capsule 25 mg  25 mg Oral TID Laverle Hobby, PA-C       Followed by  . [START ON 06/20/2013] chlordiazePOXIDE (LIBRIUM) capsule 25 mg  25 mg Oral BH-qamhs Spencer E Simon, PA-C       Followed by  . [START ON 06/22/2013] chlordiazePOXIDE (LIBRIUM) capsule 25 mg  25 mg Oral Daily Laverle Hobby, PA-C      . cholecalciferol (VITAMIN D) tablet 1,000 Units  1,000 Units Oral Daily Laverle Hobby, PA-C   1,000 Units at 06/18/13 0848  . diltiazem (CARDIZEM CD) 24 hr capsule 240 mg  240 mg Oral Daily Laverle Hobby, PA-C   240 mg at  06/18/13 0848  . DULoxetine (CYMBALTA) DR capsule 20 mg  20 mg Oral Daily Nicholaus Bloom, MD   20 mg at 06/18/13 1731  . feeding supplement (RESOURCE [name]) (RESOURCE [name]) liquid 1 Container  1 Container Oral TID BM Darrol Jump, RD   1 Container at 06/18/13 2114  . [START ON 06/19/2013] gabapentin (  NEURONTIN) capsule 200 mg  200 mg Oral TID Nicholaus Bloom, MD      . hydrOXYzine (ATARAX/VISTARIL) tablet 25 mg  25 mg Oral Q6H PRN Laverle Hobby, PA-C      . loperamide (IMODIUM) capsule 2-4 mg  2-4 mg Oral PRN Laverle Hobby, PA-C      . metoCLOPramide (REGLAN) tablet 10 mg  10 mg Oral TID AC Laverle Hobby, PA-C   10 mg at 06/18/13 1722  . multivitamin with minerals tablet 1 tablet  1 tablet Oral Daily Laverle Hobby, PA-C   1 tablet at 06/18/13 0849  . norethindrone-ethinyl estradiol (FEMHRT 1/5) 1-5 MG-MCG per tablet 1 tablet  1 tablet Oral Daily Laverle Hobby, PA-C      . ondansetron (ZOFRAN-ODT) disintegrating tablet 4 mg  4 mg Oral Q6H PRN Laverle Hobby, PA-C      . pantoprazole (PROTONIX) EC tablet 40 mg  40 mg Oral Daily Laverle Hobby, PA-C   40 mg at 06/18/13 0850  . simvastatin (ZOCOR) tablet 20 mg  20 mg Oral QHS Laverle Hobby, PA-C   20 mg at 06/18/13 2113  . SUMAtriptan (IMITREX) tablet 100 mg  100 mg Oral Q2H PRN Laverle Hobby, PA-C      . thiamine (B-1) injection 100 mg  100 mg Intramuscular Once Laverle Hobby, PA-C      . [START ON 06/19/2013] thiamine (VITAMIN B-1) tablet 100 mg  100 mg Oral Daily Laverle Hobby, PA-C      . traZODone (DESYREL) tablet 50 mg  50 mg Oral QHS,MR X 1 Laverle Hobby, PA-C   50 mg at 06/18/13 2114    Observation Level/Precautions:  detox  Laboratory:  TSH  Psychotherapy:   group  Medications:  Cymbalta  Consultations:  IM if needed  Discharge Concerns:  Access to care, follow up  Estimated LOS:   5-7 days  Other:     I certify that inpatient services furnished can reasonably be expected to improve the patient's condition.    Nena Polio 4/21/20159:38 PM  Personally evaluated the patient, reviewed the physical exam and labs and agree with assessment and plan Geralyn Flash A. Sabra Heck, M.D.

## 2013-06-18 NOTE — Progress Notes (Signed)
D:  Patient's self inventory sheet, patient sleeps well, poor appetite, low energy level, poor attention span.  Rated depression 5, anxiety 7, denied hopeless.  Denied withdrawals.  Denied SI.  Stated she has fibromyalgia pain.  Zero pain goal, worst pain in past 24 hours #5.   Plans to discharge to her home.  No problems with medications after discharge. A:  Medications administered per MD orders.  Emotional support and encouragement given patient. R:  Denied SI and HI.  Denied A/V hallucinations.  Will continue to monitor patient for safety with 15 minute checks.  Safety maintained.

## 2013-06-18 NOTE — Progress Notes (Signed)
The focus of this group is to educate the patient on the purpose and policies of crisis stabilization and provide a format to answer questions about their admission.  The group details unit policies and expectations of patients while admitted.  Patient did not attend 0900 nurse education orientation group this morning.  Patient stayed in bed sleeping.    

## 2013-06-18 NOTE — Progress Notes (Signed)
Recreation Therapy Notes  nimal-Assisted Activity/Therapy (AAA/T) Program Checklist/Progress Notes Patient Eligibility Criteria Checklist & Daily Group note for Rec Tx Intervention  Date: 04.21.2015 Time: 2:45pm Location: [address] Dayroom    AAA/T Program Assumption of Risk Form signed by Patient/ or Parent Legal Guardian yes  Patient is free of allergies or sever asthma yes  Patient reports no fear of animals yes  Patient reports no history of cruelty to animals yes   Patient understands his/her participation is voluntary yes  Behavioral Response: Did not attend.   Terri Ayala, LRT/CTRS        Terri Ayala 06/18/2013 4:28 PM 

## 2013-06-18 NOTE — [facility] Suicide Risk Assessment (Signed)
Suicide Risk Assessment  Admission Assessment     Nursing information obtained from:  Patient Demographic factors:  Caucasian;Living alone Current Mental Status:  NA Loss Factors:  Decline in physical health Historical Factors:  NA Risk Reduction Factors:  Employed;Positive therapeutic relationship Total Time spent with patient: 45 minutes  CLINICAL FACTORS:   Depression:   Comorbid alcohol abuse/dependence Alcohol/Substance Abuse/Dependencies  Psychiatric Specialty Exam:     Blood pressure 90/55, pulse 96, temperature 97.9 F (36.6 C), temperature source Oral, resp. rate 16, height 5' (1.524 m), weight 45.36 kg (100 lb).Body mass index is 19.53 kg/(m^2).  General Appearance: Disheveled  Eye Sport and exercise psychologist::  Fair  Speech:  Clear and Coherent and uses hearing aids  Volume:  Decreased  Mood:  Anxious, Depressed and tired  Affect:  Restricted  Thought Process:  Coherent and Goal Directed  Orientation:  Full (Time, Place, and Person)  Thought Content:  symptms, worries, concerns  Suicidal Thoughts:  No  Homicidal Thoughts:  No  Memory:  Immediate;   Fair Recent;   Fair Remote;   Fair  Judgement:  Fair  Insight:  Present and Shallow  Psychomotor Activity:  Psychomotor Retardation  Concentration:  Fair  Recall:  [employer] of Knowledge:NA  Language: Fair  Akathisia:  No  Handed:    AIMS (if indicated):     Assets:  Desire for Improvement Vocational/Educational  Sleep:  Number of Hours: 3.5   Musculoskeletal: Strength & Muscle Tone: within normal limits Gait & Station: normal Patient leans: N/A  COGNITIVE FEATURES THAT CONTRIBUTE TO RISK:  Closed-mindedness Polarized thinking Thought constriction (tunnel vision)    SUICIDE RISK:   Mild:  Suicidal ideation of limited frequency, intensity, duration, and specificity.  There are no identifiable plans, no associated intent, mild dysphoria and related symptoms, good self-control (both objective and subjective assessment), few  other risk factors, and identifiable protective factors, including available and accessible social support.  PLAN OF CARE: Supportive approach/coping skills.relapse prevention                              Consider Cymbalta to address the depression/anxiety/chronic pain  I certify that inpatient services furnished can reasonably be expected to improve the patient's condition.  Nicholaus Bloom 06/18/2013, 5:06 PM

## 2013-06-18 NOTE — ED Notes (Signed)
Pt needs medications in applesauce.

## 2013-06-18 NOTE — [facility] Group Notes (Deleted)
[place] LCSW Group Therapy      Feelings About Diagnosis 1:15 - 2:30 PM         06/18/2013 3:24 PM    Type of Therapy:  Group Therapy  Participation Level:  Active  Terri Ayala 06/18/2013   3:24 PM

## 2013-06-18 NOTE — BHH Counselor (Signed)
Adult Comprehensive Assessment  Patient ID: AAHNA ROSSA, female   DOB: [birth year], 58 y.o.   MRN: [id number]  Information Source: Information source: Patient  Current Stressors:  Educational / Learning stressors: None Employment / Job issues: Stressed due to being unable to function as needed Family Relationships: None Museum/gallery curator / Lack of resources (include bankruptcy): None Housing / Lack of housing: None Physical health (include injuries & life threatening diseases): Medical problems that have not been diagnosed Social relationships: None Substance abuse: Drinks a bottle of wine daily  Bereavement / Loss: None  Living/Environment/Situation:  Living Arrangements: Alone Living conditions (as described by patient or guardian): good How long has patient lived in current situation?: Eight years What is atmosphere in current home: Comfortable  Family History:  Marital status: Single Does patient have children?: No  Childhood History:  By whom was/is the patient raised?: Both parents Additional childhood history information: Patient and mother never clicked Description of patient's relationship with caregiver when they were a child: [place] relationshipwith father who was very nuturing - Did not get along well with mother Patient's description of current relationship with people who raised him/her: Very good with mother - Father is deceased Does patient have siblings?: Yes Number of Siblings: 2 Description of patient's current relationship with siblings: Okay relationship with brothers Did patient suffer any verbal/emotional/physical/sexual abuse as a child?: No Did patient suffer from severe childhood neglect?: No Has patient ever been sexually abused/assaulted/raped as an adolescent or adult?: No Was the patient ever a victim of a crime or a disaster?: No Witnessed domestic violence?: No Has patient been effected by domestic violence as an adult?: No  Education:  Highest grade  of school patient has completed: Four years of college and one year of graduate school Currently a student?: No  Employment/Work Situation:   Employment situation: Employed Where is patient currently employed?: [employer] How long has patient been employed?: Eight and a half years Patient's job has been impacted by current illness: Yes Describe how patient's job has been impacted: Inability to stay focused What is the longest time patient has a held a job?: 15 yeaers Where was the patient employed at that time?: [employer] Has patient ever been in the [employer]?: No Has patient ever served in Recruitment consultant?: No  Financial Resources:   Museum/gallery curator resources: Income from employment Does patient have a representative payee or guardian?: No  Alcohol/Substance Abuse:   What has been your use of drugs/alcohol within the last 12 months?: Patient long history of drinking a bottle of wine daily Alcohol/Substance Abuse Treatment Hx: Denies past history Has alcohol/substance abuse ever caused legal problems?: No  Social Support System:   Patient's Community Support System: Good Describe Community Support System: Scientist, research (physical sciences) with wildlife Type of faith/religion: Christian How does patient's faith help to cope with current illness?: Does not apply her faith  Leisure/Recreation:   Leisure and Hobbies: Working with Pharmacologist:   What things does the patient do well?: Not able to identify anything at this time In what areas does patient struggle / problems for patient: Inability   Discharge Plan:   Does patient have access to transportation?: Yes Will patient be returning to same living situation after discharge?: Yes Currently receiving community mental health services: Yes (From Whom) ([facility] in [place]) If no, would patient like referral for services when discharged?: Yes (What county?) ([facility]) Does patient have financial  barriers related to discharge medications?: No  Summary/Recommendations:  Shardae Kleinman is a 58 year old female admitted with Depressive Disorder NOS, Generalized Anxiety Disorder and Alcohol Dependence.  She will benefit from crisis stabilization, evaluation for medication, psycho-education groups for coping skills development, group therapy and case management for discharge planning.     Aizlyn Schifano Hairston Emmilia Sowder. 06/18/2013

## 2013-06-18 NOTE — Progress Notes (Addendum)
Patient ID: Terri Ayala, female   DOB: 01-Dec-1955, 58 y.o.   MRN: [id number] Pt. Is a 58 yo female admitted with alcoholism and increased depression. Pt. Reports she drinks a bottle of wine a day. This is patients first admission at [facility], reports she is seen by a private psychiatrist. Pt. Has hx of gall bladder surgery in 1/15, were they noticed she was malnourished and had liver problems. Pt. Reports poor appetite . Stressors: job related, neuropathy, and alcoholism. Pt.has a medical history of low back pain, osteoporosis, supraventricular tachycardia, migraines, dysphagia, and hyperlipidemia. 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 Pt. Says she lived alone and drinks during the night. Pt. Denies SHI, AVH, or legal issues. Pt. Made a high fall risk due to osteoporosis and neuropathy, walk slowly.  Pt. Offered snacks and drinks, oriented to unit and room. Staff will monitor  q54min for safety. Pt. Is safe on the unit.

## 2013-06-19 LAB — TSH: TSH: 3.25 u[IU]/mL (ref 0.350–4.500)

## 2013-06-19 MED ORDER — MAGNESIUM HYDROXIDE 400 MG/5ML PO SUSP
30.0000 mL | Freq: Every day | ORAL | Status: DC | PRN
  Administered 2013-06-19: 30 mL via ORAL

## 2013-06-19 NOTE — BHH Group Notes (Signed)
[facility] LCSW Group Therapy  Emotional Regulation 1:15 - 2: 30 PM        06/19/2013     Type of Therapy:  Group Therapy  Participation Level:  Appropriate  Participation Quality:  Appropriate  Affect:  Appropriate  Cognitive:  Attentive Appropriate  Insight:  Developing/Improving Engaged  Engagement in Therapy:  Developing/Improving Engaged  Modes of Intervention:  Discussion Exploration Problem-Solving Supportive  Summary of Progress/Problems:  Group topic was emotional regulations.  Patient participated in the discussion and was able to identify an emotion that needed to regulated.  She shared she has a lot of stress and anxiety which lead to increased drinking. Options for residential treatment and IOP were explored but patient unable to be away from work. Patient was able to identify approprite coping skills.  Concha Pyo 06/19/2013

## 2013-06-19 NOTE — Progress Notes (Signed)
Adult Psychoeducational Group Note  Date:  06/19/2013 Time:  9:42 PM  Group Topic/Focus:  Wrap-Up Group:   The focus of this group is to help patients review their daily goal of treatment and discuss progress on daily workbooks.  Participation Level:  Minimal  Participation Quality:  Appropriate  Affect:  Appropriate  Cognitive:  Appropriate  Insight: Appropriate  Engagement in Group:  Engaged  Modes of Intervention:  Support  Additional Comments:Pt came in at the end of group and was attentive to the discussion that was occuring between her peers. She also provided support to one of her peers who shared that he was struggling with his relationship woth his father.   Keora Eccleston 06/19/2013, 9:42 PM

## 2013-06-19 NOTE — Progress Notes (Signed)
D: Patient appropriate and cooperative with staff. Patient has anxious affect and depressed mood. She reported on the self inventory sheet that she's sleeping well, appetite is improving, energy level is normal, and ability to pay attention is good. Patient rates depression "2". She's attending groups, but has little to no interaction with other peers on the unit. Adheres to medication regimen.  A: Support and encouragement provided to patient. Administered scheduled medications per ordering MD. Monitor Q15 minute checks for safety.  R: Patient receptive. Denies SI/HI and AVH. Patient remains safe on the unit.

## 2013-06-19 NOTE — Progress Notes (Signed)
Pt observed lying in bed.  She has been in bed since the beginning of shift.  Pt reports she does not feel well, and pt is visibly sweaty.  She denies N/V nor diarrhea.  She says she feels shaky and unsteady and prefers to stay in bed.  Pt's meds were taken to her.  Pt's BP has been low and staff has encouraged pt to drink fluids and also the [name] supplement that was ordered for her.  Pt denies SI/HI/AV.  Pt encouraged to make her needs known to staff.  Support and encouragement offered.  Safety maintained with q15 minute checks.

## 2013-06-19 NOTE — BHH Group Notes (Signed)
[facility] LCSW Aftercare Discharge Planning Group Note   06/19/2013 10:58 AM    Participation Quality:  Appropraite  Mood/Affect:  Appropriate  Depression Rating:    Anxiety Rating:    Thoughts of Suicide:  No  Will you contract for safety?   NA  Current AVH:  No  Plan for Discharge/Comments:  Patient attended discharge planning group and actively participated in group.  She will follow up with [facility] and [facility].  CSW provided all participants with daily workbook.   Transportation Means: Patient has transportation.   Supports:  Patient has a support system.   Naphtali Riede, Eulas Post

## 2013-06-19 NOTE — Progress Notes (Signed)
[facility] MD Progress Note  06/19/2013 5:01 PM Terri Ayala  MRN:  [id number] Subjective:  Met with patient who states she feels "great". She reports that she feels like she has woken up a bit, she says the Cymbalta and Neurontin seem to be helping some already. Says she feels that she is walking almost normal. Says her hearing aid batteries are going out. Diagnosis:   DSM5: Schizophrenia Disorders:   Obsessive-Compulsive Disorders:   Trauma-Stressor Disorders:   Substance/Addictive Disorders:   Depressive Disorders:   Total Time spent with patient: 20 minutes    ADL's:  Intact  Sleep: Good  Appetite:  Good  Suicidal Ideation:  denies Homicidal Ideation:  denies AEB (as evidenced by):  Psychiatric Specialty Exam: Physical Exam  ROS  Blood pressure 109/72, pulse 97, temperature 98.1 F (36.7 C), temperature source Oral, resp. rate 16, height 5' (1.524 m), weight 45.36 kg (100 lb), SpO2 98.00%.Body mass index is 19.53 kg/(m^2).  General Appearance: casual  Eye Contact::  Good  Speech:  Clear and Coherent  Volume:  Normal  Mood:  Anxious and Depressed  Affect:  Congruent  Thought Process:  Goal Directed  Orientation:  Full (Time, Place, and Person)  Thought Content:  WDL  Suicidal Thoughts:  No  Homicidal Thoughts:  No  Memory:  NA  Judgement:  Fair  Insight:  Shallow  Psychomotor Activity:  Normal  Concentration:  Fair  Recall:  [place] of Knowledge:Good  Language: Good  Akathisia:  No  Handed:  Right  AIMS (if indicated):     Assets:  Communication Skills Desire for Improvement Financial Resources/Insurance Housing Physical Health Social Support Transportation Vocational/Educational  Sleep:  Number of Hours: 3.5   Musculoskeletal: Strength & Muscle Tone: within normal limits Gait & Station: improving Patient leans: N/A  Current Medications: Current Facility-Administered Medications  Medication Dose Route Frequency Provider Last Rate Last Dose  .  aspirin EC tablet 81 mg  81 mg Oral Daily Laverle Hobby, PA-C   81 mg at 06/19/13 4854  . calcium carbonate (TUMS - dosed in mg elemental calcium) chewable tablet 500 mg  500 mg Oral Daily Laverle Hobby, PA-C   500 mg at 06/19/13 0813  . chlordiazePOXIDE (LIBRIUM) capsule 25 mg  25 mg Oral Q6H PRN Laverle Hobby, PA-C      . chlordiazePOXIDE (LIBRIUM) capsule 25 mg  25 mg Oral TID Laverle Hobby, PA-C       Followed by  . [START ON 06/20/2013] chlordiazePOXIDE (LIBRIUM) capsule 25 mg  25 mg Oral BH-qamhs Spencer E Simon, PA-C       Followed by  . [START ON 06/22/2013] chlordiazePOXIDE (LIBRIUM) capsule 25 mg  25 mg Oral Daily Laverle Hobby, PA-C      . cholecalciferol (VITAMIN D) tablet 1,000 Units  1,000 Units Oral Daily Laverle Hobby, PA-C   1,000 Units at 06/19/13 0813  . diltiazem (CARDIZEM CD) 24 hr capsule 240 mg  240 mg Oral Daily Laverle Hobby, PA-C   240 mg at 06/19/13 0848  . DULoxetine (CYMBALTA) DR capsule 20 mg  20 mg Oral Daily Nicholaus Bloom, MD   20 mg at 06/19/13 [phone]  . feeding supplement (RESOURCE [name]) (RESOURCE [name]) liquid 1 Container  1 Container Oral TID BM Darrol Jump, RD   1 Container at 06/19/13 1425  . gabapentin (NEURONTIN) capsule 200 mg  200 mg Oral TID Nicholaus Bloom, MD   200 mg at 06/19/13  1152  . hydrOXYzine (ATARAX/VISTARIL) tablet 25 mg  25 mg Oral Q6H PRN Laverle Hobby, PA-C      . loperamide (IMODIUM) capsule 2-4 mg  2-4 mg Oral PRN Laverle Hobby, PA-C      . metoCLOPramide (REGLAN) tablet 10 mg  10 mg Oral TID AC Laverle Hobby, PA-C   10 mg at 06/19/13 1152  . multivitamin with minerals tablet 1 tablet  1 tablet Oral Daily Laverle Hobby, PA-C   1 tablet at 06/19/13 1610  . norethindrone-ethinyl estradiol (FEMHRT 1/5) 1-5 MG-MCG per tablet 1 tablet  1 tablet Oral Daily Laverle Hobby, PA-C      . ondansetron (ZOFRAN-ODT) disintegrating tablet 4 mg  4 mg Oral Q6H PRN Laverle Hobby, PA-C      . pantoprazole (PROTONIX) EC tablet 40 mg   40 mg Oral Daily Laverle Hobby, PA-C   40 mg at 06/19/13 9604  . simvastatin (ZOCOR) tablet 20 mg  20 mg Oral QHS Laverle Hobby, PA-C   20 mg at 06/18/13 2113  . SUMAtriptan (IMITREX) tablet 100 mg  100 mg Oral Q2H PRN Laverle Hobby, PA-C      . thiamine (B-1) injection 100 mg  100 mg Intramuscular Once [employer], PA-C      . thiamine (VITAMIN B-1) tablet 100 mg  100 mg Oral Daily Laverle Hobby, PA-C   100 mg at 06/19/13 5409  . traZODone (DESYREL) tablet 50 mg  50 mg Oral QHS,MR X 1 Laverle Hobby, PA-C   50 mg at 06/18/13 2114    Lab Results:  Results for orders placed during the hospital encounter of 06/17/13 (from the past 48 hour(s))  COMPREHENSIVE METABOLIC PANEL     Status: Abnormal   Collection Time    06/17/13  8:05 PM      Result Value Ref Range   Sodium 141  137 - 147 mEq/L   Potassium 3.9  3.7 - 5.3 mEq/L   Chloride 98  96 - 112 mEq/L   CO2 24  19 - 32 mEq/L   Glucose, Bld 165 (*) 70 - 99 mg/dL   BUN 12  6 - 23 mg/dL   Creatinine, Ser 0.50  0.50 - 1.10 mg/dL   Calcium 10.1  8.4 - 10.5 mg/dL   Total Protein 8.1  6.0 - 8.3 g/dL   Albumin 4.7  3.5 - 5.2 g/dL   AST 131 (*) 0 - 37 U/L   ALT 31  0 - 35 U/L   Alkaline Phosphatase 117  39 - 117 U/L   Total Bilirubin 0.3  0.3 - 1.2 mg/dL   GFR calc non Af Amer >90  >90 mL/min   GFR calc Af Amer >90  >90 mL/min   Comment: (NOTE)     The eGFR has been calculated using the CKD EPI equation.     This calculation has not been validated in all clinical situations.     eGFR's persistently <90 mL/min signify possible Chronic Kidney     Disease.  ETHANOL     Status: Abnormal   Collection Time    06/17/13  8:05 PM      Result Value Ref Range   Alcohol, Ethyl (B) 117 (*) 0 - 11 mg/dL   Comment:            LOWEST DETECTABLE LIMIT FOR     SERUM ALCOHOL IS 11 mg/dL     FOR MEDICAL PURPOSES  ONLY  CBC     Status: Abnormal   Collection Time    06/17/13  8:05 PM      Result Value Ref Range   WBC 5.4  4.0 - 10.5 K/uL    RBC 4.13  3.87 - 5.11 MIL/uL   Hemoglobin 13.1  12.0 - 15.0 g/dL   HCT 37.8  36.0 - 46.0 %   MCV 91.5  78.0 - 100.0 fL   MCH 31.7  26.0 - 34.0 pg   MCHC 34.7  30.0 - 36.0 g/dL   RDW 13.7  11.5 - 15.5 %   Platelets 123 (*) 150 - 400 K/uL  URINE RAPID DRUG SCREEN (HOSP PERFORMED)     Status: None   Collection Time    06/17/13 10:04 PM      Result Value Ref Range   Opiates NONE DETECTED  NONE DETECTED   Cocaine NONE DETECTED  NONE DETECTED   Benzodiazepines NONE DETECTED  NONE DETECTED   Amphetamines NONE DETECTED  NONE DETECTED   Tetrahydrocannabinol NONE DETECTED  NONE DETECTED   Barbiturates NONE DETECTED  NONE DETECTED   Comment:            DRUG SCREEN FOR MEDICAL PURPOSES     ONLY.  IF CONFIRMATION IS NEEDED     FOR ANY PURPOSE, NOTIFY LAB     WITHIN 5 DAYS.                LOWEST DETECTABLE LIMITS     FOR URINE DRUG SCREEN     Drug Class       Cutoff (ng/mL)     Amphetamine      1000     Barbiturate      200     Benzodiazepine   595     Tricyclics       396     Opiates          300     Cocaine          300     THC              50    Physical Findings: AIMS: Facial and Oral Movements Muscles of Facial Expression: None, normal Lips and Perioral Area: None, normal Jaw: None, normal Tongue: None, normal,Extremity Movements Upper (arms, wrists, hands, fingers): None, normal Lower (legs, knees, ankles, toes): None, normal, Trunk Movements Neck, shoulders, hips: None, normal, Overall Severity Severity of abnormal movements (highest score from questions above): None, normal Incapacitation due to abnormal movements: None, normal Patient's awareness of abnormal movements (rate only patient's report): No Awareness, Dental Status Current problems with teeth and/or dentures?: No Does patient usually wear dentures?: No  CIWA:  CIWA-Ar Total: 1 COWS:  COWS Total Score: 2  Treatment Plan Summary: Daily contact with patient to assess and evaluate symptoms and progress in  treatment Medication management  Plan: 1. Continue crisis management and stabilization. 2. Medication management to reduce current symptoms to base line and improve the patient's overall level of functioning. 3. Treat health problems as indicated. 4. Develop treatment plan to decrease risk of relapse upon discharge and to reduce the need for readmission. 5. Psycho-social education regarding relapse prevention and self care. 6. Health care follow up as needed for medical problems. 7. Restart home medications where appropriate. 8. TSH and Free T3 9. Disposition is in progress will review on Friday for d/c. Medical Decision Making Problem Points:  Established problem, stable/improving (1) Data Points:  Review  or order medicine tests (1)  I certify that inpatient services furnished can reasonably be expected to improve the patient's condition.   Nena Polio 06/19/2013, 5:01 PM Agree with assessment and plan Geralyn Flash A. Sabra Heck, M.D.

## 2013-06-19 NOTE — Tx Team (Signed)
Interdisciplinary Treatment Plan Update   Date Reviewed:  06/19/2013  Time Reviewed:  9:36 AM  Progress in Treatment:   Attending groups: Yes Participating in groups: Yes Taking medication as prescribed: Yes  Tolerating medication: Yes Family/Significant other contact made:  No, but consent provided for collateral contact Patient understands diagnosis: Yes  Discussing patient identified problems/goals with staff: Yes Medical problems stabilized or resolved: Yes Denies suicidal/homicidal ideation: Yes Patient has not harmed self or others: Yes  For review of initial/current patient goals, please see plan of care.  Estimated Length of Stay:  2-4 days  Reasons for Continued Hospitalization:  Anxiety Depression Medication stabilization   New Problems/Goals identified:    Discharge Plan or Barriers:   Home with outpatient follow up with [facility] and [facility]  Additional Comments:  Terri Ayala is an 58 y.o. female who presents with increased alcohol and depression. She reports she was hospitalized for two weeks after complications from a gallbladder surgery where they learned she was malnourished and had liver problems. WHile there she woke up with neuropathy she had not had before, and despite committing to stop drinking to live to see her retirement, she could not cope with the pain and so she began drinking again and reports drinking at least a bottle of wine daily for the last two weeks after detoxing during her hospitalization. She began drinking during childhood because she came from a family that drank heavily. She has never attempted detoxing before, but has gone to counseling and just started with a new therapist today, who suggested she come here   Attendees:  Patient:  06/19/2013 9:36 AM   Signature:  06/19/2013 9:36 AM  Signature:  Nena Polio, [place] 06/19/2013 9:36 AM  Signature:   06/19/2013 9:36 AM  Signature: Jules Husbands, RN  06/19/2013 9:36 AM   Signature:   06/19/2013 9:36 AM  Signature:  Joette Catching, LCSW 06/19/2013 9:36 AM  Signature:  Regan Lemming, LCSW 06/19/2013 9:36 AM  Signature:  Lucinda Dell, Care Coordinator [facility] 06/19/2013 9:36 AM  Signature:  06/19/2013 9:36 AM  Signature: Marilynne Halsted, RN 06/19/2013  9:36 AM  Signature:   Lars Pinks, RN [facility] 06/19/2013  9:36 AM  Signature:  Eduard Roux, RN 06/19/2013  9:36 AM    Scribe for Treatment Team:   Joette Catching,  06/19/2013 9:36 AM

## 2013-06-19 NOTE — Progress Notes (Signed)
Pt reports she is doing better and has been up more today.  She says she feels more steady this evening.  Pt denies SI/HI/AV.  She reports her withdrawal symptoms was decreasing.  Pt does c/o constipation and an order for MOM was received from the PA.  Pt has been pleasant/cooperative.  She hasn't gone to groups much today as she still felt weak.  Pt makes her needs known to staff.  Support and encouragement offered.  Tolerating meds well.  Safety maintained with q15 minute checks.

## 2013-06-19 NOTE — Progress Notes (Signed)
Adult Psychoeducational Group Note  Date:  06/18/2013 Time:  11:38 PM  Group Topic/Focus:  Wrap-Up Group:   The focus of this group is to help patients review their daily goal of treatment and discuss progress on daily workbooks.  Participation Level:  Pt did not attend  Additional Comments:  Pt was sleeping and did not want to get up.  Cherre Kothari A [facility] 06/18/2013, 11:38 PM

## 2013-06-20 DIAGNOSIS — F411 Generalized anxiety disorder: Secondary | ICD-10-CM

## 2013-06-20 DIAGNOSIS — F3289 Other specified depressive episodes: Secondary | ICD-10-CM

## 2013-06-20 DIAGNOSIS — F329 Major depressive disorder, single episode, unspecified: Secondary | ICD-10-CM

## 2013-06-20 LAB — T3, FREE: T3 FREE: 3 pg/mL (ref 2.3–4.2)

## 2013-06-20 NOTE — Progress Notes (Signed)
Adult Psychoeducational Group Note  Date:  06/20/2013 Time:  9:00 AM  Group Topic/Focus:  Morning Wellness Group  Participation Level:  Active  Participation Quality:  Appropriate and Attentive  Affect:  Appropriate  Cognitive:  Alert and Oriented  Insight: Good  Engagement in Group:  Engaged  Modes of Intervention:  Discussion  Additional Comments: Patient participated in the stretching exercises this morning.  Kathlen [place] 06/20/2013, 1:15 PM

## 2013-06-20 NOTE — Progress Notes (Signed)
Adult Psychoeducational Group Note  Date:  06/19/2013 Time:  10:00am Group Topic/Focus:  Personal Choices and Values:   The focus of this group is to help patients assess and explore the importance of values in their lives, how their values affect their decisions, how they express their values and what opposes their expression.  Participation Level:  Active  Participation Quality:  Appropriate and Attentive  Affect:  Flat  Cognitive:  Alert and Appropriate  Insight: Good  Engagement in Group:  Engaged  Modes of Intervention:  Discussion and Education  Additional Comments:  Pt attended and participated in group. Discussion was on what does personal development mean to you? Pt stated helping others.  Gailen Shelter 06/20/2013, 4:09 AM

## 2013-06-20 NOTE — [facility] Group Notes (Signed)
[facility] LCSW Group Therapy  06/20/2013   1:15 PM   Type of Therapy:  Group Therapy  Participation Level:  Active  Participation Quality:  Attentive, Sharing and Supportive  Affect:  Depressed and Flat  Cognitive:  Alert and Oriented  Insight:  Developing/Improving and Engaged  Engagement in Therapy:  Developing/Improving and Engaged  Modes of Intervention:  Activity, Clarification, Confrontation, Discussion, Education, Exploration, Limit-setting, Orientation, Problem-solving, Rapport Building, Reality Testing, Socialization and Support  Summary of Progress/Problems: Patient was attentive and engaged with speaker from [employer].  Patient was attentive to speaker while they shared their story of dealing with mental health and overcoming it.  Patient expressed interest in their programs and services and received information on their agency.  Patient processed ways they can relate to the speaker.     Jovonni Borquez Horton, LCSW 06/20/2013  2:30 PM   

## 2013-06-20 NOTE — Progress Notes (Signed)
Adult Psychoeducational Group Note  Date:  06/20/2013 Time:  10:00am Group Topic/Focus:  Personal Choices and Values:   The focus of this group is to help patients assess and explore the importance of values in their lives, how their values affect their decisions, how they express their values and what opposes their expression.  Participation Level:  Active  Participation Quality:  Appropriate and Attentive  Affect:  Appropriate  Cognitive:  Alert and Appropriate  Insight: Appropriate  Engagement in Group:  Engaged  Modes of Intervention:  Discussion and Education  Additional Comments:  Pt attended and participated in group. Question was asked what is your biggest stressor? Pt stated feeling out of control.  Gailen Shelter 06/20/2013, 12:09 PM

## 2013-06-20 NOTE — Progress Notes (Signed)
D: Patient's has depressed affect and anxious mood. She reported on the self inventory sheet that she's sleeping well, appetite and ability to pay attention are both good, and energy level is normal. Patient rates depression "2". She's partaking in scheduled groups throughout the day. Compliant with medications.  A: Support and encouragement provided to patient. Scheduled medications administered per MD orders. Maintain Q15 minute checks for safety.  R: Patient receptive. Denies SI/HI. Patient remains safe.

## 2013-06-20 NOTE — [facility] Suicide Risk Assessment (Signed)
[facility] INPATIENT:  Family/Significant Other Suicide Prevention Education  Suicide Prevention Education:   Patient was not endorsing SI/HI at time of admission nor during hospitalization. Patient Refusal for Family/Significant Other Suicide Prevention Education: The patient Terri Ayala has refused to provide written consent for family/significant other to be provided Family/Significant Other Suicide Prevention Education during admission and/or prior to discharge.  Physician notified.  [name] Ivo Moga 06/20/2013, 8:55 AM

## 2013-06-21 DIAGNOSIS — F1010 Alcohol abuse, uncomplicated: Secondary | ICD-10-CM

## 2013-06-21 DIAGNOSIS — F411 Generalized anxiety disorder: Secondary | ICD-10-CM

## 2013-06-21 DIAGNOSIS — F329 Major depressive disorder, single episode, unspecified: Secondary | ICD-10-CM

## 2013-06-21 MED ORDER — SIMVASTATIN 20 MG PO TABS: 20.0000 mg | ORAL_TABLET | Freq: Every day | ORAL | Status: DC

## 2013-06-21 MED ORDER — DULOXETINE HCL 20 MG PO CPEP: 20.0000 mg | ORAL_CAPSULE | Freq: Every day | ORAL | Status: DC

## 2013-06-21 MED ORDER — TRAZODONE HCL 50 MG PO TABS: ORAL_TABLET | ORAL | Status: DC

## 2013-06-21 MED ORDER — ALENDRONATE SODIUM 70 MG PO TABS: 70.0000 mg | ORAL_TABLET | ORAL | Status: DC

## 2013-06-21 MED ORDER — MAGNESIUM HYDROXIDE 400 MG/5ML PO SUSP: 30.0000 mL | Freq: Every day | ORAL | Status: DC | PRN

## 2013-06-21 MED ORDER — VITAMIN D 1000 UNITS PO TABS: 1000.0000 [IU] | ORAL_TABLET | Freq: Every day | ORAL | Status: DC

## 2013-06-21 MED ORDER — PANTOPRAZOLE SODIUM 40 MG PO TBEC: 40.0000 mg | DELAYED_RELEASE_TABLET | Freq: Every day | ORAL | Status: DC

## 2013-06-21 MED ORDER — ASPIRIN 81 MG PO TABS: 81.0000 mg | ORAL_TABLET | Freq: Every day | ORAL | Status: DC

## 2013-06-21 MED ORDER — ALUM & MAG HYDROXIDE-SIMETH 200-200-20 MG/5ML PO SUSP: 30.0000 mL | ORAL | Status: DC | PRN

## 2013-06-21 MED ORDER — NORETHINDRONE-ETH ESTRADIOL 1-5 MG-MCG PO TABS: 1.0000 | ORAL_TABLET | Freq: Every day | ORAL | Status: DC

## 2013-06-21 MED ORDER — GABAPENTIN 100 MG PO CAPS: 200.0000 mg | ORAL_CAPSULE | Freq: Three times a day (TID) | ORAL | Status: DC

## 2013-06-21 MED ORDER — METOCLOPRAMIDE HCL 10 MG PO TABS: 10.0000 mg | ORAL_TABLET | Freq: Three times a day (TID) | ORAL | Status: DC

## 2013-06-21 MED ORDER — GLUCOSAMINE-CHONDROITIN 500-400 MG PO TABS: 1.0000 | ORAL_TABLET | Freq: Every day | ORAL | Status: DC

## 2013-06-21 MED ORDER — ACETAMINOPHEN 325 MG PO TABS: 650.0000 mg | ORAL_TABLET | Freq: Four times a day (QID) | ORAL | Status: DC | PRN

## 2013-06-21 NOTE — Progress Notes (Addendum)
Patient ID: Terri Ayala, female   DOB: [birth year], 58 y.o.   MRN: [id number] [facility] MD Progress Note  06/20/2013 4:55 PM Terri Ayala  MRN:  [id number] Subjective:  Pt reports much improvement over yesterday stating that depression and anxiety symptoms have improved significantly. Pt denies SI, HI, and AVH, contracts for safety. Pt rates depression at 2/10 and anxiety at 3/10. Pt reports participation in group therapy and is hopeful to discharge within the next couple days. Pt reports good sleep and good appetite as well. Pt is calm, alert, oriented x4, answers questions appropriately, and is in NAD. Pt is smiling and appears to be positive about her progress inpatient at [facility]. Pt is HOH and expressed a desire for this NP to speak louder, which did help her hear better.    HPI:Terri Ayala is an 58 y.o. female who presents with increased alcohol and depression. She reports she was hospitalized for two weeks after complications from a gallbladder surgery where they learned she was malnourished and had liver problems. WHile there she woke up with neuropathy she had not had before, and despite committing to stop drinking to live to see her retirement, she could not cope with the pain and so she began drinking again and reports drinking at least a bottle of wine daily for the last two weeks after detoxing during her hospitalization. She began drinking during childhood because she came from a family that drank heavily. She has never attempted detoxing before, but has gone to counseling and just started with a new therapist today, who suggested she come here.   Diagnosis:   DSM5: Schizophrenia Disorders:   Obsessive-Compulsive Disorders:   Trauma-Stressor Disorders:   Substance/Addictive Disorders:   Depressive Disorders:   Total Time spent with patient: 25 minutes    ADL's:  Intact  Sleep: Good  Appetite:  Good  Suicidal Ideation:  denies Homicidal Ideation:  denies AEB (as evidenced  by):  Psychiatric Specialty Exam: Physical Exam  ROS  Blood pressure 90/60, pulse 87, temperature 98 F (36.7 C), temperature source Oral, resp. rate 16, height 5' (1.524 m), weight 45.36 kg (100 lb), SpO2 98.00%.Body mass index is 19.53 kg/(m^2).  General Appearance: casual  Eye Contact::  Good  Speech:  Clear and Coherent  Volume:  Normal  Mood:  Euthymic  Affect:  Appropriate and Congruent  Thought Process:  Goal Directed  Orientation:  Full (Time, Place, and Person)  Thought Content:  WDL  Suicidal Thoughts:  No  Homicidal Thoughts:  No  Memory:  NA  Judgement:  Fair  Insight:  Fair  Psychomotor Activity:  Normal  Concentration:  Fair  Recall:  [place] of Knowledge:Good  Language: Good  Akathisia:  No  Handed:  Right  AIMS (if indicated):     Assets:  Communication Skills Desire for Improvement Financial Resources/Insurance Housing Physical Health Resilience Social Support Transportation Vocational/Educational  Sleep:  Number of Hours: 6.5   Musculoskeletal: Strength & Muscle Tone: within normal limits Gait & Station: improving Patient leans: N/A  Current Medications: Current Facility-Administered Medications  Medication Dose Route Frequency Provider Last Rate Last Dose  . acetaminophen (TYLENOL) tablet 650 mg  650 mg Oral Q6H PRN Nicholaus Bloom, MD      . alum & mag hydroxide-simeth (MAALOX/MYLANTA) 200-200-20 MG/5ML suspension 30 mL  30 mL Oral Q4H PRN Nicholaus Bloom, MD      . aspirin EC tablet 81 mg  81 mg Oral Daily Laverle Hobby,  PA-C   81 mg at 06/21/13 0830  . calcium carbonate (TUMS - dosed in mg elemental calcium) chewable tablet 500 mg  500 mg Oral Daily Laverle Hobby, PA-C   500 mg at 06/21/13 0831  . [START ON 06/22/2013] chlordiazePOXIDE (LIBRIUM) capsule 25 mg  25 mg Oral Daily Laverle Hobby, PA-C      . cholecalciferol (VITAMIN D) tablet 1,000 Units  1,000 Units Oral Daily Laverle Hobby, PA-C   1,000 Units at 06/21/13 0831  . diltiazem  (CARDIZEM CD) 24 hr capsule 240 mg  240 mg Oral Daily Laverle Hobby, PA-C   240 mg at 06/21/13 0830  . DULoxetine (CYMBALTA) DR capsule 20 mg  20 mg Oral Daily Nicholaus Bloom, MD   20 mg at 06/21/13 0831  . feeding supplement (RESOURCE [name]) (RESOURCE [name]) liquid 1 Container  1 Container Oral TID BM Darrol Jump, RD   1 Container at 06/21/13 1033  . gabapentin (NEURONTIN) capsule 200 mg  200 mg Oral TID Nicholaus Bloom, MD   200 mg at 06/21/13 1154  . magnesium hydroxide (MILK OF MAGNESIA) suspension 30 mL  30 mL Oral Daily PRN Laverle Hobby, PA-C   30 mL at 06/19/13 2207  . magnesium hydroxide (MILK OF MAGNESIA) suspension 30 mL  30 mL Oral Daily PRN Nicholaus Bloom, MD      . metoCLOPramide (REGLAN) tablet 10 mg  10 mg Oral TID AC Laverle Hobby, PA-C   10 mg at 06/21/13 1154  . multivitamin with minerals tablet 1 tablet  1 tablet Oral Daily Laverle Hobby, PA-C   1 tablet at 06/21/13 0830  . norethindrone-ethinyl estradiol (FEMHRT 1/5) 1-5 MG-MCG per tablet 1 tablet  1 tablet Oral Daily Spencer E Simon, PA-C      . pantoprazole (PROTONIX) EC tablet 40 mg  40 mg Oral Daily Laverle Hobby, PA-C   40 mg at 06/21/13 0831  . simvastatin (ZOCOR) tablet 20 mg  20 mg Oral QHS Laverle Hobby, PA-C   20 mg at 06/20/13 2232  . SUMAtriptan (IMITREX) tablet 100 mg  100 mg Oral Q2H PRN Laverle Hobby, PA-C      . thiamine (B-1) injection 100 mg  100 mg Intramuscular Once [employer], PA-C      . thiamine (VITAMIN B-1) tablet 100 mg  100 mg Oral Daily Laverle Hobby, PA-C   100 mg at 06/21/13 0830  . traZODone (DESYREL) tablet 50 mg  50 mg Oral QHS,MR X 1 Laverle Hobby, PA-C   50 mg at 06/20/13 2232    Lab Results:  Results for orders placed during the hospital encounter of 06/18/13 (from the past 48 hour(s))  TSH     Status: None   Collection Time    06/19/13  9:02 PM      Result Value Ref Range   TSH 3.250  0.350 - 4.500 uIU/mL   Comment: Please note change in reference range.      Performed at [facility]  T3, FREE     Status: None   Collection Time    06/19/13  9:02 PM      Result Value Ref Range   T3, Free 3.0  2.3 - 4.2 pg/mL   Comment: Performed at [facility]    Physical Findings: AIMS: Facial and Oral Movements Muscles of Facial Expression: None, normal Lips and Perioral Area: None, normal Jaw: None, normal Tongue: None,  normal,Extremity Movements Upper (arms, wrists, hands, fingers): None, normal Lower (legs, knees, ankles, toes): None, normal, Trunk Movements Neck, shoulders, hips: None, normal, Overall Severity Severity of abnormal movements (highest score from questions above): None, normal Incapacitation due to abnormal movements: None, normal Patient's awareness of abnormal movements (rate only patient's report): No Awareness, Dental Status Current problems with teeth and/or dentures?: No Does patient usually wear dentures?: No  CIWA:  CIWA-Ar Total: 0 COWS:  COWS Total Score: 2  Treatment Plan Summary: Daily contact with patient to assess and evaluate symptoms and progress in treatment Medication management  Plan: Review of chart, vital signs, medications, and notes.  1-Individual and group therapy  2-Medication management for depression and anxiety: Medications reviewed with the patient and she stated no untoward effects, unchanged. 3-Coping skills for depression, anxiety  4-Continue crisis stabilization and management  5-Address health issues--monitoring vital signs, stable  6-Treatment plan in progress to prevent relapse of depression and anxiety  Medical Decision Making Problem Points:  Established problem, stable/improving (1), Review of last therapy session (1) and Review of psycho-social stressors (1) Data Points:  Review or order clinical lab tests (1) Review or order medicine tests (1) Review of medication regiment & side effects (2) Review of new medications or change in dosage (2)  I certify that inpatient  services furnished can reasonably be expected to improve the patient's condition.   Benjamine Mola , FNP-BC 06/20/2013, 4:55 PM Agree with assessment and plan Geralyn Flash A. Sabra Heck, M.D.

## 2013-06-21 NOTE — Progress Notes (Signed)
[facility] Adult Case Management Discharge Plan :  Will you be returning to the same living situation after discharge: Yes,  Patient is returning to her home. At discharge, do you have transportation home?:Yes,  Patient to arrange transportation home. Do you have the ability to pay for your medications:Yes,  Patient is able to obtain medications.  Release of information consent forms completed and in the chart;  Patient's signature needed at discharge.  Patient to Follow up at: Follow-up Information   Follow up with Dr. Doyne Keel - [facility] On 07/25/2013. (Thursday, Jul 25, 2013 at11.  Please arrive at 10:30 with complete registration form required for all patient .)    Contact information:   [place], [place]   [address]  [phone]      Follow up with [facility] On 06/27/2013. (Thursday, June 27, 2013 at 11:15 AM)    Contact information:   [place], [place]   [address]  [phone]        Patient denies SI/HI:  Patient no longer endorsing SI/HI or other thoughts of self harm.   Safety Planning and Suicide Prevention discussed: .Reviewed with all patients during discharge planning group  [place] 06/21/2013, 11:10 AM

## 2013-06-21 NOTE — Discharge Summary (Signed)
Physician Discharge Summary Note  Patient:  Terri Ayala is an 58 y.o., female MRN:  [id number] DOB:  1955-11-12 Patient phone:  [phone] (home)  Patient address:   [place] [address],  Total Time spent with patient: 30 minutes  Date of Admission:  06/18/2013 Date of Discharge: 06/21/2013  Reason for Admission:  Depression with alcohol dependence  Discharge Diagnoses: Active Problems:   Alcohol abuse   MDD (major depressive disorder)   Major depression   Psychiatric Specialty Exam:  Please see D/C SRA Physical Exam  ROS  Blood pressure 90/60, pulse 87, temperature 98 F (36.7 C), temperature source Oral, resp. rate 16, height 5' (1.524 m), weight 45.36 kg (100 lb), SpO2 98.00%.Body mass index is 19.53 kg/(m^2).  General Appearance:   Eye Contact::    Speech:    Volume:    Mood:    Affect:    Thought Process:    Orientation:    Thought Content:    Suicidal Thoughts:    Homicidal Thoughts:    Memory:    Judgement:    Insight:    Psychomotor Activity:    Concentration:    Recall:    Fund of Knowledge:  Language:   Akathisia:    Handed:    AIMS (if indicated):     Assets:    Sleep:  Number of Hours: 6.5    Past Psychiatric History: Diagnosis:  Hospitalizations:  Outpatient Care:  Substance Abuse Care:  Self-Mutilation:  Suicidal Attempts:  Violent Behaviors:   Musculoskeletal: Strength & Muscle Tone:  Gait & Station:  Patient leans:   DSM5:  Schizophrenia Disorders:   Obsessive-Compulsive Disorders:   Trauma-Stressor Disorders:   Substance/Addictive Disorders:  Alcohol dependence Depressive Disorders:  Major Depressive Disorder - Severe (296.23)  Axis Diagnosis:  Discharge Diagnoses:  AXIS I: Major Depression, Alcohol Abuse, GAD  AXIS II: No diagnosis  AXIS III:  Past Medical History   Diagnosis  Date   .  Low back pain    .  Anxiety    .  Migraines    .  GOITER, MULTINODULAR  08/17/2009   .  OSTEOPOROSIS  08/17/2009    .  ASYMPTOMATIC POSTMENOPAUSAL STATUS  08/17/2009   .  DYSPHAGIA  06/19/2007   .  Supraventricular tachycardia    .  Hyperlipidemia    .  Mitral valve prolapse    .  PONV (postoperative nausea and vomiting)     AXIS IV: other psychosocial or environmental problems  AXIS V: 61-70 mild symptoms  Level of Care:  OP  Hospital Course:         ONA ROEHRS is an 58 y.o. female who presents with increased alcohol and depression. She reports she was hospitalized for two weeks after complications from a gallbladder surgery where they learned she was malnourished and had liver problems. WHile there she woke up with neuropathy she had not had before, and despite committing to stop drinking to live to see her retirement, she could not cope with the pain and so she began drinking again and reports drinking at least a bottle of wine daily for the last two weeks after detoxing during her hospitalization.        Terri Ayala was admitted to [facility] for further stabilization and treatment. She was assessed upon arrival at the unit and her symptoms were documented. She was started on the standard [facility] detox protocol and encouraged to participate in unit programming for coping skills and relaxation.  She was followed each day by a clinical provider to assess her progress with detox and response to treatment. She was also started on Cymbalta to cover both the neuropathy as well ad her depression and anxiety.        She responded well to being in a therapeutic environment and taking medication. Terri Ayala's behaviors were appropriate and she did not require seclusion or restraint. She attended groups and participated appropriately.        By the day of discharge she was much improved and reported that she was ready to return home. She denied any withdrawal symptoms and stated that she had no SI/HI or AVH.        Terri Ayala was discharged home in much improved condition and agreed to the follow up plan noted below. Consults:   None  Significant Diagnostic Studies:  labs: TSH, Free T3  Discharge Vitals:   Blood pressure 90/60, pulse 87, temperature 98 F (36.7 C), temperature source Oral, resp. rate 16, height 5' (1.524 m), weight 45.36 kg (100 lb), SpO2 98.00%. Body mass index is 19.53 kg/(m^2). Lab Results:   Results for orders placed during the hospital encounter of 06/18/13 (from the past 72 hour(s))  TSH     Status: None   Collection Time    06/19/13  9:02 PM      Result Value Ref Range   TSH 3.250  0.350 - 4.500 uIU/mL   Comment: Please note change in reference range.     Performed at [facility]  T3, FREE     Status: None   Collection Time    06/19/13  9:02 PM      Result Value Ref Range   T3, Free 3.0  2.3 - 4.2 pg/mL   Comment: Performed at [facility]    Physical Findings: AIMS: Facial and Oral Movements Muscles of Facial Expression: None, normal Lips and Perioral Area: None, normal Jaw: None, normal Tongue: None, normal,Extremity Movements Upper (arms, wrists, hands, fingers): None, normal Lower (legs, knees, ankles, toes): None, normal, Trunk Movements Neck, shoulders, hips: None, normal, Overall Severity Severity of abnormal movements (highest score from questions above): None, normal Incapacitation due to abnormal movements: None, normal Patient's awareness of abnormal movements (rate only patient's report): No Awareness, Dental Status Current problems with teeth and/or dentures?: No Does patient usually wear dentures?: No  CIWA:  CIWA-Ar Total: 0 COWS:  COWS Total Score: 2  Psychiatric Specialty Exam: See Psychiatric Specialty Exam and Suicide Risk Assessment completed by Attending Physician prior to discharge.  Discharge destination:  Home  Is patient on multiple antipsychotic therapies at discharge:  No   Has Patient had three or more failed trials of antipsychotic monotherapy by history:  No  Recommended Plan for Multiple Antipsychotic  Therapies: NA  Discharge Orders   Future Appointments Provider Department Dept Phone   07/01/2013 2:00 PM Alda Berthold, MD [facility] Neurology [facility] [phone]   07/03/2013 3:10 PM Harl Bowie, [facility], [place] [phone]   08/26/2013 8:00 AM Alda Berthold, MD [facility] Neurology [facility] [phone]   Future Orders Complete By Expires   Diet - low sodium heart healthy  As directed    Discharge instructions  As directed    Increase activity slowly  As directed        Medication List    STOP taking these medications       ALPRAZolam 0.5 MG tablet  Commonly known as:  Duanne Moron  TAKE these medications     Indication   alendronate 70 MG tablet  Commonly known as:  FOSAMAX  Take 1 tablet (70 mg total) by mouth every 7 (seven) days. Take with a full glass of water on an empty stomach.   Indication:  Osteopenia     aspirin 81 MG tablet  Take 1 tablet (81 mg total) by mouth daily.   Indication:  Heart Attack     calcium carbonate 200 MG capsule  Take 200 mg by mouth daily.     osteopenia   cholecalciferol 1000 UNITS tablet  Commonly known as:  VITAMIN D  Take 1 tablet (1,000 Units total) by mouth daily.   Indication:  Vitamin D deficiency     diltiazem 240 MG 24 hr capsule  Commonly known as:  CARDIZEM CD  Take 1 capsule (240 mg total) by mouth daily.     Hypertension   DULoxetine 20 MG capsule  Commonly known as:  CYMBALTA  Take 1 capsule (20 mg total) by mouth daily.   Indication:  Major Depressive Disorder     gabapentin 100 MG capsule  Commonly known as:  NEURONTIN  Take 2 capsules (200 mg total) by mouth 3 (three) times daily.   Indication:  Neuropathic Pain     glucosamine-chondroitin 500-400 MG tablet  Take 1 tablet by mouth daily.   Indication:  Joint Damage causing Pain and Loss of Function     metoCLOPramide 10 MG tablet  Commonly known as:  REGLAN  Take 1 tablet (10 mg total) by mouth 3 (three) times daily before meals.    Indication:  Gastroparesis     norethindrone-ethinyl estradiol 1-5 MG-MCG Tabs  Commonly known as:  FEMHRT 1/5  Take 1 tablet by mouth daily.   Indication:  Postmenopausal Osteoporosis     pantoprazole 40 MG tablet  Commonly known as:  PROTONIX  Take 1 tablet (40 mg total) by mouth daily.   Indication:  Gastroesophageal Reflux Disease     simvastatin 20 MG tablet  Commonly known as:  ZOCOR  Take 1 tablet (20 mg total) by mouth at bedtime.   Indication:  Type II A Hyperlipidemia     SUMAtriptan 100 MG tablet  Commonly known as:  IMITREX  Take 100 mg by mouth every 2 (two) hours as needed for migraine or headache. May repeat in 2 hours if headache persists or recurs.      traZODone 50 MG tablet  Commonly known as:  DESYREL  Take one tablet as needed for insomnia.   Indication:  Trouble Sleeping           Follow-up Information   Follow up with Dr. Doyne Keel - [facility] On 07/25/2013. (Thursday, Jul 25, 2013 at11.  Please arrive at 10:30 with complete registration form required for all patient .)    Contact information:   [place], [place]   [address]  [phone]      Follow up with [facility] On 06/27/2013. (Thursday, June 27, 2013 at 11:15 AM)    Contact information:   [place], [place]   [address]  [phone]        Follow-up recommendations:   Activities: Resume activity as tolerated. Diet: Heart healthy low sodium diet Tests: Follow up testing will be determined by your out patient provider. Comments:    Total Discharge Time:  Less than 30 minutes.  SignedNena Polio 06/21/2013, 1:42 PM  Personally evaluated the patient, and agree with assessment and plan Geralyn Flash A. Sabra Heck, M.D.

## 2013-06-21 NOTE — Tx Team (Signed)
Interdisciplinary Treatment Plan Update   Date Reviewed:  06/21/2013  Time Reviewed:  8:45 AM  Progress in Treatment:   Attending groups: Yes Participating in groups: Yes Taking medication as prescribed: Yes  Tolerating medication: Yes Family/Significant other contact made:  No collateral contact due to patient not endorsing SI at time of admission. Patient understands diagnosis: Yes  Discussing patient identified problems/goals with staff: Yes Medical problems stabilized or resolved: Yes Denies suicidal/homicidal ideation: Yes Patient has not harmed self or others: Yes  For review of initial/current patient goals, please see plan of care.  Estimated Length of Stay:  Discharge today  Reasons for Continued Hospitalization:   New Problems/Goals identified:    Discharge Plan or Barriers:   Home with outpatient follow up with [facility] and [place]  Additional Comments:    Attendees:  Patient:  06/21/2013 8:45 AM   Signature:  Carlton Adam, MD 06/21/2013 8:45 AM  Signature:  Nena Polio, PA 06/21/2013 8:45 AM  Signature:   06/21/2013 8:45 AM  Signature: Grayland Ormond, RN  06/21/2013 8:45 AM  Signature:   06/21/2013 8:45 AM  Signature:  Joette Catching, LCSW 06/21/2013 8:45 AM  Signature:  Regan Lemming, LCSW 06/21/2013 8:45 AM  Signature:  Lucinda Dell, Care Coordinator [facility] 06/21/2013 8:45 AM  Signature:  06/21/2013 8:45 AM  Signature:  06/21/2013  8:45 AM  Signature:   Lars Pinks, RN [facility] 06/21/2013  8:45 AM  Signature:  Eduard Roux, RN 06/21/2013  8:45 AM    Scribe for Treatment Team:   Joette Catching,  06/21/2013 8:45 AM

## 2013-06-21 NOTE — Progress Notes (Signed)
Discharge Note: Discharge instructions/prescriptions/medication samples given to patient. Patient verbalized understanding of discharge instructions and prescriptions. Returned belongings to patient. Denies SI/HI/AVH. Patient d/c without incident to the lobby and transported home by a friend.

## 2013-06-21 NOTE — Progress Notes (Signed)
D: Pt denies SI/HI/AVH. Pt is pleasant and cooperative. Pt a little sad and has minimal interaction tonight due to the fact that her batteries ran down in her hearing aids .  A: Pt was offered support and encouragement. Pt was given scheduled medications. Pt was encourage to attend groups. Q 15 minute checks were done for safety.   R:Pt attends groups and interacts well with peers and staff. Pt is taking medication.Pt receptive to treatment and safety maintained on unit.

## 2013-06-21 NOTE — BHH Group Notes (Signed)
[facility] LCSW Group Therapy  Feelings Around Relapse 1:15 -2:30        06/21/2013 3:03 PM\  Type of Therapy:  Group Therapy  Participation Level:  Appropriate  Participation Quality:  Appropriate  Affect:  Appropriate  Cognitive:  Attentive Appropriate  Insight:  Developing/Improving  Engagement in Therapy: Developing/Improving  Modes of Intervention:  Discussion Exploration Problem-Solving Supportive  Summary of Progress/Problems:  The topic for today was feelings around relapse.    Patient processed feelings toward relapse and was able to relate to peers. Patient shared she isolates and makes excused to herself as to why she does not want to spend time with her support group. Patient identified coping skills that can be used to prevent a relapse.   Concha Pyo 06/21/2013 3:03 PM

## 2013-06-21 NOTE — BHH Group Notes (Signed)
[facility] LCSW Aftercare Discharge Planning Group Note   06/21/2013 11:08 AM    Participation Quality:  Appropraite  Mood/Affect:  Appropriate  Depression Rating:  2  Anxiety Rating:  3  Thoughts of Suicide:  No  Will you contract for safety?   NA  Current AVH:  No  Plan for Discharge/Comments:  Patient attended discharge planning group and actively participated in group.  Patient to follow up with [facility] and [facility].  CSW provided all participants with daily workbook.   Transportation Means: Patient has transportation.   Supports:  Patient has a support system.   [name], Eulas Post

## 2013-06-21 NOTE — BHH Suicide Risk Assessment (Signed)
Suicide Risk Assessment  Discharge Assessment     Demographic Factors:  Caucasian  Total Time spent with patient: 45 minutes  Psychiatric Specialty Exam:     Blood pressure 90/60, pulse 87, temperature 98 F (36.7 C), temperature source Oral, resp. rate 16, height 5' (1.524 m), weight 45.36 kg (100 lb), SpO2 98.00%.Body mass index is 19.53 kg/(m^2).  General Appearance: Fairly Groomed  Engineer, water::  Fair  Speech:  Clear and Coherent  Volume:  Normal  Mood:  Euthymic  Affect:  Appropriate  Thought Process:  Coherent and Goal Directed  Orientation:  Full (Time, Place, and Person)  Thought Content:  plans as she moves on, self care, coping skills  Suicidal Thoughts:  No  Homicidal Thoughts:  No  Memory:  Immediate;   Fair Recent;   Fair Remote;   Fair  Judgement:  Fair  Insight:  Present  Psychomotor Activity:  Normal  Concentration:  Fair  Recall:  [employer] [place]: Fair  Akathisia:  No  Handed:    AIMS (if indicated):     Assets:  Desire for Improvement Housing Social Support Talents/Skills  Sleep:  Number of Hours: 6.5    Musculoskeletal: Strength & Muscle Tone: within normal limits Gait & Station: normal Patient leans: N/A   Mental Status Per Nursing Assessment::   On Admission:  NA  Current Mental Status by Physician: In full contact with reality. There are no active SI, intent or plans. Her mood is euthymic, her affect is appropriate. She is ready to be D/C. She feels ready to resume her work. Plans to get herself back active around people, her support group.    Loss Factors: NA  Historical Factors: NA  Risk Reduction Factors:   Employed and Positive social support  Continued Clinical Symptoms:  Depression:   Comorbid alcohol abuse/dependence  Cognitive Features That Contribute To Risk:  Closed-mindedness Polarized thinking Thought constriction (tunnel vision)    Suicide Risk:  Minimal: No identifiable suicidal  ideation.  Patients presenting with no risk factors but with morbid ruminations; may be classified as minimal risk based on the severity of the depressive symptoms  Discharge Diagnoses:   AXIS I:  Major Depression, Alcohol Abuse, GAD AXIS II:  No diagnosis AXIS III:   Past Medical History  Diagnosis Date  . Low back pain   . Anxiety   . Migraines   . GOITER, MULTINODULAR 08/17/2009  . OSTEOPOROSIS 08/17/2009  . ASYMPTOMATIC POSTMENOPAUSAL STATUS 08/17/2009  . DYSPHAGIA 06/19/2007  . Supraventricular tachycardia   . Hyperlipidemia   . Mitral valve prolapse   . PONV (postoperative nausea and vomiting)    AXIS IV:  other psychosocial or environmental problems AXIS V:  61-70 mild symptoms  Plan Of Care/Follow-up recommendations:  Activity:  as tolerated Diet:  regular Follow up outpatient basis Is patient on multiple antipsychotic therapies at discharge:  No   Has Patient had three or more failed trials of antipsychotic monotherapy by history:  No  Recommended Plan for Multiple Antipsychotic Therapies: NA    Nicholaus Bloom 06/21/2013, 12:48 PM

## 2013-06-26 NOTE — Progress Notes (Signed)
Patient Discharge Instructions:  After Visit Summary (AVS):   Faxed to:  06/26/13 Discharge Summary Note:   Faxed to:  06/26/13 Psychiatric Admission Assessment Note:   Faxed to:  06/26/13 Suicide Risk Assessment - Discharge Assessment:   Faxed to:  06/26/13 Faxed/Sent to the Next Level Care provider:  06/26/13 Next Level Care Provider Has Access to the EMR, 06/26/13 Faxed to [facility] @ [phone] Records provided to [facility] via [facility]/Epic access.  Patsey Berthold, 06/26/2013, 2:43 PM

## 2013-06-28 ENCOUNTER — Other Ambulatory Visit: Payer: Self-pay | Admitting: *Deleted

## 2013-06-28 ENCOUNTER — Ambulatory Visit (INDEPENDENT_AMBULATORY_CARE_PROVIDER_SITE_OTHER): Payer: Federal, State, Local not specified - PPO | Admitting: Family Medicine

## 2013-06-28 ENCOUNTER — Encounter: Payer: Self-pay | Admitting: Family Medicine

## 2013-06-28 VITALS — BP 106/70 | HR 80 | Resp 16 | Ht 62.0 in | Wt 107.0 lb

## 2013-06-28 DIAGNOSIS — J209 Acute bronchitis, unspecified: Secondary | ICD-10-CM

## 2013-06-28 DIAGNOSIS — J302 Other seasonal allergic rhinitis: Secondary | ICD-10-CM

## 2013-06-28 DIAGNOSIS — R209 Unspecified disturbances of skin sensation: Secondary | ICD-10-CM

## 2013-06-28 DIAGNOSIS — F1010 Alcohol abuse, uncomplicated: Secondary | ICD-10-CM

## 2013-06-28 DIAGNOSIS — J309 Allergic rhinitis, unspecified: Secondary | ICD-10-CM

## 2013-06-28 DIAGNOSIS — F322 Major depressive disorder, single episode, severe without psychotic features: Secondary | ICD-10-CM

## 2013-06-28 MED ORDER — AZITHROMYCIN 500 MG PO TABS: 500.0000 mg | ORAL_TABLET | Freq: Every day | ORAL | Status: DC

## 2013-06-28 NOTE — Progress Notes (Signed)
   Subjective:    Patient ID: Terri Ayala, female    DOB: [birth year], 58 y.o.   MRN: [id number]  HPI  On Saturday the patient began to have a productive cough and have a runny nose. The patient also notes headache, runny nose, sore throat, itchy/full ears ears, decreased appetite, head "fuzziness," difficulty concentrating and cough productive of yellow sputum. The cough has worsened in the last six days and keeps the patient from sleeping.   The patient has tried clariten D and nasicort because she thought this was allergies.  She denies any ear pain, tooth pain, or fever.   The patient also reports good follow up with her neurologist. She is currently on gabapentin and cymbalta for neuropathy which has spread to her upper extremities.  She was also recently hospitalized and placed in therapy for alcohol abuse and depression. She will followup with her psychologist next week and is scheduled to see the psychiatrist at the end of the month. She was not very interested in talking about her alcohol problem.  Review of Systems is negative except per HPI.    Objective:   Physical Exam  Constitutional: Patient is well-developed, well-nourished, and in no distress. HENT: Head is normocephalic and atraumatic.  Mouth/Throat: Oropharynx is clear and moist without erythema or exudates Cardiovascular: Normal rate, regular rhythm. Exam reveals no murmurs, gallops and no friction rub.  Pulmonary/Chest: Effort normal and CTAB. No respiratory distress. No wheezes or ronchi.   Psychiatric: Affect normal.   Assessment & Plan:  Alcohol abuse  Major depressive disorder, single episode, severe, without mention of psychotic behavior  Acute bronchitis  Seasonal allergic rhinitis  Will prescribe azithromycin 500 mg three times once a day.   The patient should continue to see her neurologist for close follow up of her neuropathy. I also encouraged her to followup with her psychologist and psychiatrist  which she plans to do.

## 2013-06-28 NOTE — Progress Notes (Deleted)
   Subjective:    Patient ID: Terri Ayala, female    DOB: 12/20/1955, 58 y.o.   MRN: [id number]  HPI    Review of Systems     Objective:   Physical Exam        Assessment & Plan:

## 2013-07-01 ENCOUNTER — Encounter: Payer: Federal, State, Local not specified - PPO | Admitting: Neurology

## 2013-07-02 ENCOUNTER — Telehealth: Payer: Self-pay | Admitting: Internal Medicine

## 2013-07-02 MED ORDER — AMOXICILLIN-POT CLAVULANATE 875-125 MG PO TABS: 1.0000 | ORAL_TABLET | Freq: Two times a day (BID) | ORAL | Status: DC

## 2013-07-02 NOTE — Telephone Encounter (Signed)
Let her know that I called a different medication in 

## 2013-07-02 NOTE — Telephone Encounter (Signed)
LM on pt VM notifying her of new medication called in

## 2013-07-02 NOTE — Telephone Encounter (Signed)
Pt states she is not feeling any better from when she was in on Friday. She is still coughing, tired and has headaches. She called out of work yesterday and today cause she is not feeling any better. Does she need to come in or can you send something else in to [address].

## 2013-07-03 ENCOUNTER — Encounter (INDEPENDENT_AMBULATORY_CARE_PROVIDER_SITE_OTHER): Payer: Federal, State, Local not specified - PPO | Admitting: Surgery

## 2013-07-08 ENCOUNTER — Encounter: Payer: Self-pay | Admitting: Family Medicine

## 2013-07-08 ENCOUNTER — Ambulatory Visit (INDEPENDENT_AMBULATORY_CARE_PROVIDER_SITE_OTHER): Payer: Federal, State, Local not specified - PPO | Admitting: Family Medicine

## 2013-07-08 VITALS — BP 110/70 | HR 114 | Wt 101.0 lb

## 2013-07-08 DIAGNOSIS — M25511 Pain in right shoulder: Secondary | ICD-10-CM

## 2013-07-08 DIAGNOSIS — M25519 Pain in unspecified shoulder: Secondary | ICD-10-CM

## 2013-07-08 NOTE — Progress Notes (Signed)
   Subjective:    Patient ID: Terri Ayala, female    DOB: [birth year], 58 y.o.   MRN: [id number]  HPI She slipped in her bathroom and landed on her right shoulder approximately 4 days ago. She apparently was unable to get up for several hours. She does have underlying bilateral shoulder pain and is being evaluated for this. She was scheduled for an EMG but canceled it because of this. He is here today for followup on this. She states that she has missed work on occasion because of the shoulder and would like a note.   Review of Systems     Objective:   Physical Exam Alert and in no distress. Exam of the right shoulder shows minimal tenderness over the distal clavicle but no defect noted. No shoulder laxity. No pain over the humerus was good motion of the humeral head. No tenderness over the scapula.      Assessment & Plan:  Right shoulder pain  supportive care for the shoulder pain. I will give her a note concerning work today. Encouraged her to reschedule for the EMG and also to inform her neurologist that she has missed work because of the shoulder pain. I told her that it would be difficult to give a noe retrospectively for her shoulder pain.

## 2013-07-09 ENCOUNTER — Telehealth: Payer: Self-pay | Admitting: Neurology

## 2013-07-09 NOTE — Telephone Encounter (Signed)
Also, I'm not sure what information was requested - she needs to clarify this.  Makes sense to reschedule follow-up until after EMG has been done.  Barney Russomanno K. Posey Pronto, DO

## 2013-07-09 NOTE — Telephone Encounter (Signed)
Please advise 

## 2013-07-09 NOTE — Telephone Encounter (Signed)
She was scheduled for EMG on 5/4 and was no show for it.  Not sure if she had to reschedule or what happened.

## 2013-07-09 NOTE — Telephone Encounter (Signed)
Per email message sent to me -   As we discussed on the phone, my nerve pain has caused me to have to take all my sick time and most of my vacation time.  My second test isnt until June and I will have to take leave without pay if I need to stay home for the pain. I am on the cancellation list to get in sooner.  If your office could provide the information requested, I will be able to stay in pay status. Also, could you ask Dr. Posey Pronto if I should keep the appointment this Thursday or wait until the second EMG test is completed. Please call me if you have any questions.    [place], [place] [address] DOB - 07-02-1955 Home - [phone] Work - [phone] Cell - [phone]

## 2013-07-10 ENCOUNTER — Telehealth: Payer: Self-pay | Admitting: Neurology

## 2013-07-10 NOTE — Telephone Encounter (Signed)
Left message for patient to call me back.  Informed her that she has an appointment tomorrow.

## 2013-07-10 NOTE — Telephone Encounter (Signed)
Pt called back at 2:25 PM. She would like to speak to [facility]

## 2013-07-11 ENCOUNTER — Ambulatory Visit: Payer: Self-pay | Admitting: Neurology

## 2013-07-11 ENCOUNTER — Other Ambulatory Visit: Payer: Self-pay | Admitting: Dermatology

## 2013-07-11 NOTE — Telephone Encounter (Signed)
See next note

## 2013-07-11 NOTE — Telephone Encounter (Signed)
I spoke with patient and she said that she is out of time to take off from work.  She says that she can't get out of bed some days because she is in so much pain.  She is requesting for [place] to fill out FMLA forms.  Informed her that there is a charge for this.  Let me know what you would like to do.  I have the forms on my desk.

## 2013-07-11 NOTE — Telephone Encounter (Signed)
Thus far, I do not have any evidence that she has neuropathy.  There is a significant overlay of malnutrition and we found zinc deficiency, and she should be taking supplements for that.  I'm not sure what's causing her shoulder pain - we are waiting on EMG.  Her neck imaging was normal.  Without a primary neurological disease/diagnosis, our office does not complete FMLA paperwork.  Kassidie Hendriks K. Posey Pronto, DO

## 2013-07-11 NOTE — Telephone Encounter (Signed)
Called patient back.  Left message for her to call me.

## 2013-07-16 ENCOUNTER — Encounter (INDEPENDENT_AMBULATORY_CARE_PROVIDER_SITE_OTHER): Payer: Federal, State, Local not specified - PPO | Admitting: Surgery

## 2013-07-17 NOTE — Telephone Encounter (Signed)
Left message informing patient that Dr. Posey Pronto will not fill out forms until we have a diagnosis.  Instructed patient to call and re-schedule her EMG.

## 2013-07-22 ENCOUNTER — Emergency Department (HOSPITAL_COMMUNITY)
Admission: EM | Admit: 2013-07-22 | Discharge: 2013-07-22 | Disposition: A | Discharge status: Home / Self Care | Payer: Federal, State, Local not specified - PPO | Source: Home / Self Care | Attending: Family Medicine | Admitting: Family Medicine

## 2013-07-22 ENCOUNTER — Emergency Department (INDEPENDENT_AMBULATORY_CARE_PROVIDER_SITE_OTHER): Payer: Federal, State, Local not specified - PPO

## 2013-07-22 ENCOUNTER — Encounter (HOSPITAL_COMMUNITY): Payer: Self-pay | Admitting: Emergency Medicine

## 2013-07-22 DIAGNOSIS — S90129A Contusion of unspecified lesser toe(s) without damage to nail, initial encounter: Secondary | ICD-10-CM

## 2013-07-22 DIAGNOSIS — W19XXXA Unspecified fall, initial encounter: Secondary | ICD-10-CM

## 2013-07-22 NOTE — ED Provider Notes (Signed)
Terri Ayala is a 58 y.o. female who presents to Urgent Care today for left toe injury. Patient fell about 1 foot hitting her left first and second toes. She notes pain and swelling. She's been using buddy tape and a postoperative shoe which helps. She feels well otherwise. She is worried that she may have fractured her toe. She has a history of osteoporosis.   Past Medical History  Diagnosis Date  . Low back pain   . Anxiety   . Migraines   . GOITER, MULTINODULAR 08/17/2009  . OSTEOPOROSIS 08/17/2009  . ASYMPTOMATIC POSTMENOPAUSAL STATUS 08/17/2009  . DYSPHAGIA 06/19/2007  . Supraventricular tachycardia   . Hyperlipidemia   . Mitral valve prolapse   . PONV (postoperative nausea and vomiting)    History  Substance Use Topics  . Smoking status: Never Smoker   . Smokeless tobacco: Never Used  . Alcohol Use: 2.4 oz/week    4 Glasses of wine per week   ROS as above Medications: No current facility-administered medications for this encounter.   Current Outpatient Prescriptions  Medication Sig Dispense Refill  . alendronate (FOSAMAX) 70 MG tablet Take 1 tablet (70 mg total) by mouth every 7 (seven) days. Take with a full glass of water on an empty stomach.  4 tablet  11  . ALPRAZolam (XANAX) 0.5 MG tablet       . amoxicillin-clavulanate (AUGMENTIN) 875-125 MG per tablet Take 1 tablet by mouth 2 (two) times daily.  20 tablet  0  . aspirin 81 MG tablet Take 1 tablet (81 mg total) by mouth daily.  30 tablet    . calcium carbonate 200 MG capsule Take 200 mg by mouth daily.       . cholecalciferol (VITAMIN D) 1000 UNITS tablet Take 1 tablet (1,000 Units total) by mouth daily.      [name] Kitchen diltiazem (CARDIZEM CD) 240 MG 24 hr capsule Take 1 capsule (240 mg total) by mouth daily.  30 capsule  11  . DULoxetine (CYMBALTA) 20 MG capsule Take 1 capsule (20 mg total) by mouth daily.  30 capsule  0  . gabapentin (NEURONTIN) 100 MG capsule Take 2 capsules (200 mg total) by mouth 3 (three) times daily.  180  capsule  0  . glucosamine-chondroitin 500-400 MG tablet Take 1 tablet by mouth daily.      . metoCLOPramide (REGLAN) 10 MG tablet Take 1 tablet (10 mg total) by mouth 3 (three) times daily before meals.  90 tablet  0  . norethindrone-ethinyl estradiol (FEMHRT 1/5) 1-5 MG-MCG TABS Take 1 tablet by mouth daily.  28 tablet    . ondansetron (ZOFRAN) 4 MG tablet       . pantoprazole (PROTONIX) 40 MG tablet Take 1 tablet (40 mg total) by mouth daily.  30 tablet  11  . simvastatin (ZOCOR) 20 MG tablet Take 1 tablet (20 mg total) by mouth at bedtime.  30 tablet  11  . SUMAtriptan (IMITREX) 100 MG tablet Take 100 mg by mouth every 2 (two) hours as needed for migraine or headache. May repeat in 2 hours if headache persists or recurs.      . traMADol (ULTRAM) 50 MG tablet       . traZODone (DESYREL) 50 MG tablet Take one tablet as needed for insomnia.  30 tablet  0    Exam:  BP 129/79  Pulse 88  Temp(Src) 98.2 F (36.8 C) (Oral)  Resp 16  SpO2 100% Gen: Well NAD Left foot: Ecchymosis and  swelling of the first and second digits. Mildly tender. Capillary refill and pulses intact  No results found for this or any previous visit (from the past 24 hour(s)). Dg Foot Complete Left  07/22/2013   CLINICAL DATA:  Fall.  Injury to toes.  EXAM: LEFT FOOT - COMPLETE 3+ VIEW  COMPARISON:  None.  FINDINGS: No fracture. No dislocation. There are mild degenerative changes involving several interphalangeal joints. Soft tissues are unremarkable.  IMPRESSION: No fracture or dislocation.   Electronically Signed   By: Lajean Manes M.D.   On: 07/22/2013 12:00    Assessment and Plan: 58 y.o. female with toe contusion. Plan for buddy tape and postoperative shoe.  Discussed warning signs or symptoms. Please see discharge instructions. Patient expresses understanding.    Gregor Hams, MD 07/24/13 [phone]

## 2013-07-22 NOTE — ED Notes (Signed)
Pt c/o left foot inj onset yest Reports she jammed her foot against concrete  Sx include swelling and bruising of greater toe and 2nd toe Slow gait; alert w/no signs of acute distress.

## 2013-07-22 NOTE — Discharge Instructions (Signed)
Thank you for coming in today. Use the rigid sole shoe as needed.  Take iburpofen or tylenol for pain as needed.    Contusion A contusion is a deep bruise. Contusions are the result of an injury that caused bleeding under the skin. The contusion may turn blue, purple, or yellow. Minor injuries will give you a painless contusion, but more severe contusions may stay painful and swollen for a few weeks.  CAUSES  A contusion is usually caused by a blow, trauma, or direct force to an area of the body. SYMPTOMS   Swelling and redness of the injured area.  Bruising of the injured area.  Tenderness and soreness of the injured area.  Pain. DIAGNOSIS  The diagnosis can be made by taking a history and physical exam. An X-ray, CT scan, or MRI may be needed to determine if there were any associated injuries, such as fractures. TREATMENT  Specific treatment will depend on what area of the body was injured. In general, the best treatment for a contusion is resting, icing, elevating, and applying cold compresses to the injured area. Over-the-counter medicines may also be recommended for pain control. Ask your caregiver what the best treatment is for your contusion. HOME CARE INSTRUCTIONS   Put ice on the injured area.  Put ice in a plastic bag.  Place a towel between your skin and the bag.  Leave the ice on for 15-20 minutes, 03-04 times a day.  Only take over-the-counter or prescription medicines for pain, discomfort, or fever as directed by your caregiver. Your caregiver may recommend avoiding anti-inflammatory medicines (aspirin, ibuprofen, and naproxen) for 48 hours because these medicines may increase bruising.  Rest the injured area.  If possible, elevate the injured area to reduce swelling. SEEK IMMEDIATE MEDICAL CARE IF:   You have increased bruising or swelling.  You have pain that is getting worse.  Your swelling or pain is not relieved with medicines. MAKE SURE YOU:    Understand these instructions.  Will watch your condition.  Will get help right away if you are not doing well or get worse. Document Released: 11/24/2004 Document Revised: 05/09/2011 Document Reviewed: 12/20/2010 [facility] Patient Information 2014 [place], [place].  Buddy Taping of Toes We have taped your toes together to keep them from moving. This is called "buddy taping" since we used a part of your own body to keep the injured part still. We placed soft padding between your toes to keep them from rubbing against each other. Buddy taping will help with healing and to reduce pain. Keep your toes buddy taped together for as long as directed by your caregiver. HOME CARE INSTRUCTIONS   Raise your injured area above the level of your heart while sitting or lying down. Prop it up with pillows.  An ice pack used every twenty minutes, while awake, for the first one to two days may be helpful. Put ice in a plastic bag and put a towel between the bag and your skin.  Watch for signs that the taping is too tight. These signs may be:  Numbness of your taped toes.  Coolness of your taped toes.  Color change in the area beyond the tape.  Increased pain.  If you have any of these signs, loosen or rewrap the tape. If you need to loosen or rewrap the buddy tape, make sure you use the padding again. SEEK IMMEDIATE MEDICAL CARE IF:   You have worse pain, swelling, inflammation (soreness), drainage or bleeding after you rewrap  the tape.  Any new problems occur. MAKE SURE YOU:   Understand these instructions.  Will watch your condition.  Will get help right away if you are not doing well or get worse. Document Released: 11/19/2003 Document Revised: 05/09/2011 Document Reviewed: 02/12/2008 [facility] Patient Information 2014 [place].

## 2013-07-25 ENCOUNTER — Ambulatory Visit (HOSPITAL_COMMUNITY): Payer: Self-pay | Admitting: Psychiatry

## 2013-07-26 ENCOUNTER — Other Ambulatory Visit (HOSPITAL_COMMUNITY): Payer: Self-pay | Admitting: Physician Assistant

## 2013-08-05 ENCOUNTER — Encounter (INDEPENDENT_AMBULATORY_CARE_PROVIDER_SITE_OTHER): Payer: Self-pay | Admitting: Surgery

## 2013-08-05 ENCOUNTER — Ambulatory Visit (INDEPENDENT_AMBULATORY_CARE_PROVIDER_SITE_OTHER): Payer: Federal, State, Local not specified - PPO | Admitting: Surgery

## 2013-08-05 VITALS — BP 122/75 | HR 92 | Temp 98.2°F | Resp 20 | Ht 62.0 in | Wt 106.6 lb

## 2013-08-05 DIAGNOSIS — Z09 Encounter for follow-up examination after completed treatment for conditions other than malignant neoplasm: Secondary | ICD-10-CM

## 2013-08-05 DIAGNOSIS — D135 Benign neoplasm of extrahepatic bile ducts: Secondary | ICD-10-CM

## 2013-08-05 DIAGNOSIS — D134 Benign neoplasm of liver: Secondary | ICD-10-CM

## 2013-08-05 NOTE — Progress Notes (Signed)
Subjective:     Patient ID: Terri Ayala, female   DOB: November 29, 1955, 58 y.o.   MRN: [id number]  HPI She is here for followup from her hospitalization in January where she underwent a cholecystectomy for a large adenomatous polyp of the gallbladder. She actually had to stay in the hospital for weakness and severe nausea  For several days postoperatively. She still has profound weakness which is being worked up with a neurologist. She is tolerating a diet and moving her bowels  Review of Systems     Objective:   Physical Exam On exam, her incisions are well-healed her abdomen is soft and nontender    Assessment:     Patient stable status post cholecystectomy     Plan:     She has a lot of medical complaints which are being worked up by her group of physicians. I did encourage her to again see Dr. Collene Mares regarding her liver. I will see her as needed

## 2013-08-21 ENCOUNTER — Encounter: Payer: Self-pay | Admitting: *Deleted

## 2013-08-21 ENCOUNTER — Ambulatory Visit (INDEPENDENT_AMBULATORY_CARE_PROVIDER_SITE_OTHER): Payer: Federal, State, Local not specified - PPO | Admitting: Neurology

## 2013-08-21 DIAGNOSIS — R209 Unspecified disturbances of skin sensation: Secondary | ICD-10-CM

## 2013-08-21 NOTE — Procedures (Signed)
[facility] Neurology  [address] [place]  [place], [place] [address] Tel: [phone] Fax:  [phone] Test Date:  08/21/2013  Patient: Terri Ayala DOB: 10-19-1955 Physician: Narda Amber, DO  Sex: Female Height: 5\' 2"  Ref Phys: Narda Amber  ID#: [id number] Temp: 34.4C Technician:    Patient Complaints: This is 58 year-old female presenting for evaluation of bilateral arm pain and paresthesias, worse on the left side.  NCV & EMG Findings: Extensive evaluation of the left upper extremity and additional studies of the right reveals:  1. Bilateral median, ulnar, and radial sensory responses are within normal limits.  2. Bilateral median and ulnar motor responses are within normal limits.  3. There is no evidence of active or chronic motor axon loss changes affecting any of the tested muscles.   Impression: This is a normal study of the upper extremities. In particular, there is no evidence of a generalized sensorimotor polyneuropathy or cervical motor radiculopathy affecting the arms.   ___________________________ Narda Amber, DO    Nerve Conduction Studies Anti Sensory Summary Table   Site NR Peak (ms) Norm Peak (ms) P-T Amp (V) Norm P-T Amp  Left Median Anti Sensory (2nd Digit)  Wrist    2.9 <3.6 29.2 >15  Right Median Anti Sensory (2nd Digit)  Wrist    2.8 <3.6 29.3 >15  Left Radial Anti Sensory (Base 1st Digit)  Wrist    1.9 <2.7 36.8 >14  Right Radial Anti Sensory (Base 1st Digit)  Wrist    1.7 <2.7 34.0 >14  Left Ulnar Anti Sensory (5th Digit)  Wrist    2.5 <3.1 43.6 >10  Right Ulnar Anti Sensory (5th Digit)  Wrist    2.7 <3.1 34.5 >10   Motor Summary Table   Site NR Onset (ms) Norm Onset (ms) O-P Amp (mV) Norm O-P Amp Site1 Site2 Delta-0 (ms) Dist (cm) Vel (m/s) Norm Vel (m/s)  Left Median Motor (Abd Poll Brev)  Wrist    2.7 <4.0 11.0 >6 Elbow Wrist 4.5 25.0 56 >50  Elbow    7.2  10.7         Right Median Motor (Abd Poll Brev)  Wrist    2.6 <4.0  10.8 >6 Elbow Wrist 4.3 25.0 58 >50  Elbow    6.9  9.5         Left Ulnar Motor (Abd Dig Minimi)  Wrist    2.7 <3.1 8.6 >7 B Elbow Wrist 3.2 25.0 78 >50  B Elbow    5.9  7.2  A Elbow B Elbow 1.4 9.0 64 >50  A Elbow    7.3  7.0         Right Ulnar Motor (Abd Dig Minimi)  Wrist    3.0 <3.1 8.3 >7 B Elbow Wrist 2.7 21.0 78 >50  B Elbow    5.7  7.1  A Elbow B Elbow 1.2 9.0 75 >50  A Elbow    6.9  7.1          EMG   Side Muscle Ins Act Fibs Psw Fasc Number Recrt Dur Dur. Amp Amp. Poly Poly. Comment  Right 1stDorInt Nml Nml Nml Nml 1- Mod-V Nml Nml Nml Nml Nml Nml N/A  Right Ext Indicis Nml Nml Nml Nml 1- Mod-V Nml Nml Nml Nml Nml Nml N/A  Right PronatorTeres Nml Nml Nml Nml 1- Mod-V Nml Nml Nml Nml Nml Nml N/A  Right Biceps Nml Nml Nml Nml 1- Mod-V Nml Nml Nml Nml  Nml Nml N/A  Right Triceps Nml Nml Nml Nml Nml Nml Nml Nml Nml Nml Nml Nml N/A  Right Deltoid Nml Nml Nml Nml Nml Nml Nml Nml Nml Nml Nml Nml N/A  Left 1stDorInt Nml Nml Nml Nml 1- Mod-V Nml Nml Nml Nml Nml Nml N/A  Left Ext Indicis Nml Nml Nml Nml 1- Mod-V Nml Nml Nml Nml Nml Nml N/A  Left PronatorTeres Nml Nml Nml Nml 1- Mod-V Nml Nml Nml Nml Nml Nml N/A  Left Biceps Nml Nml Nml Nml Nml Nml Nml Nml Nml Nml Nml Nml N/A  Left Deltoid Nml Nml Nml Nml Nml Nml Nml Nml Nml Nml Nml Nml N/A  Left Triceps Nml Nml Nml Nml Nml Nml Nml Nml Nml Nml Nml Nml N/A      Waveforms:

## 2013-08-23 ENCOUNTER — Telehealth: Payer: Self-pay | Admitting: Family Medicine

## 2013-08-23 MED ORDER — ALPRAZOLAM 0.5 MG PO TABS: 0.5000 mg | ORAL_TABLET | Freq: Two times a day (BID) | ORAL | Status: DC | PRN

## 2013-08-23 NOTE — Telephone Encounter (Signed)
Medication called in 

## 2013-08-23 NOTE — Telephone Encounter (Signed)
Okay to renew

## 2013-08-26 ENCOUNTER — Encounter: Payer: Self-pay | Admitting: Neurology

## 2013-08-26 ENCOUNTER — Ambulatory Visit (INDEPENDENT_AMBULATORY_CARE_PROVIDER_SITE_OTHER): Payer: Federal, State, Local not specified - PPO | Admitting: Neurology

## 2013-08-26 ENCOUNTER — Encounter: Payer: Self-pay | Admitting: *Deleted

## 2013-08-26 VITALS — BP 118/70 | HR 100 | Ht 62.6 in | Wt 104.1 lb

## 2013-08-26 DIAGNOSIS — R5381 Other malaise: Secondary | ICD-10-CM

## 2013-08-26 DIAGNOSIS — R209 Unspecified disturbances of skin sensation: Secondary | ICD-10-CM

## 2013-08-26 NOTE — Patient Instructions (Addendum)
1.  Check zinc level 2.  Reduce gabapentin to 300mg  at bedtime x 2 weeks.  If there is no burning pain recurrence, then ok to stop. 3.  Continue zinc supplements for now 4.  Follow-up with primary care physician regarding joint pain 5.  Return to clinic as needed

## 2013-08-26 NOTE — Progress Notes (Signed)
Follow-up Visit   Date: 08/26/2013    Terri Ayala MRN: [id number] DOB: 1956/02/05   Interim History: Terri Ayala is a 58 y.o. right-handed Caucasian female with history of asthma, hyperlipidemia, OA, anxiety, gastroparesis, migraines returning to the clinic for follow-up of bilateral leg pain and paresthesias.  The patient was accompanied to the clinic by self.  History of present illness: She underwent laproscopic cholecystomy on January 15th 2015 due to chronic nausea and elevated LFTs. Pre-operatively she was noted to have moderately severe malnutrition (prealbumin 12.1) for which she was started on TPN. Post-operatively, she was being evaluated for her transaminitis and had been laying in bed for the entire week and one week later, she developed sudden onset of bilateral leg pain (thigh down to feet), described as "pins, needles, stabbing". The reported being unable to walk because of the pain and needed a walker. Pain has been constant since onset. It is worse at night and prolonged sitting. It is improved by gabapentin 100mg  TID which helps reduce the intensity of pain. She denies any bowel/bladder incontinence or weakness.   - Follow-up 05/24/2013:  She reports no improvement with increasing gabapentin to 100-100-200mg , but has noticed pain in her joints (shoulder and fingers).  EMG of the lower extremities was performed and shows an old L5 radiculopathy, but no evidence of neuropathy.  Her laboratory testing has been normal except for reduced level of zinc.  - Follow-up 08/26/2013:  She no longer has burning pain of her feet or arms, but continues to have bilateral shoulder, hip, and knee pain.  Pain is described as soreness and present at rest and with activity. She has associated reduced range of motion of her arms and says that it is difficult to reach for things.  She has tried OTC which does not help.  EMG of the upper extremities was normal.  MRI cervical spine showed mild  degenerative arthritis with no evidence of nerve impingement.      Medications:  Current Outpatient Prescriptions on File Prior to Visit  Medication Sig Dispense Refill  . alendronate (FOSAMAX) 70 MG tablet Take 1 tablet (70 mg total) by mouth every 7 (seven) days. Take with a full glass of water on an empty stomach.  4 tablet  11  . ALPRAZolam (XANAX) 0.5 MG tablet Take 1 tablet (0.5 mg total) by mouth 2 (two) times daily as needed for anxiety.  90 tablet  0  . aspirin 81 MG tablet Take 1 tablet (81 mg total) by mouth daily.  30 tablet    . calcium carbonate 200 MG capsule Take 200 mg by mouth daily.       . cholecalciferol (VITAMIN D) 1000 UNITS tablet Take 1 tablet (1,000 Units total) by mouth daily.      [name] Kitchen diltiazem (CARDIZEM CD) 240 MG 24 hr capsule Take 1 capsule (240 mg total) by mouth daily.  30 capsule  11  . DULoxetine (CYMBALTA) 20 MG capsule Take 1 capsule (20 mg total) by mouth daily.  30 capsule  0  . gabapentin (NEURONTIN) 100 MG capsule Take 2 capsules (200 mg total) by mouth 3 (three) times daily.  180 capsule  0  . glucosamine-chondroitin 500-400 MG tablet Take 1 tablet by mouth daily.      . metoCLOPramide (REGLAN) 10 MG tablet Take 1 tablet (10 mg total) by mouth 3 (three) times daily before meals.  90 tablet  0  . norethindrone-ethinyl estradiol (FEMHRT 1/5) 1-5 MG-MCG TABS Take  1 tablet by mouth daily.  28 tablet    . ondansetron (ZOFRAN) 4 MG tablet       . pantoprazole (PROTONIX) 40 MG tablet Take 1 tablet (40 mg total) by mouth daily.  30 tablet  11  . simvastatin (ZOCOR) 20 MG tablet Take 1 tablet (20 mg total) by mouth at bedtime.  30 tablet  11  . SUMAtriptan (IMITREX) 100 MG tablet Take 100 mg by mouth every 2 (two) hours as needed for migraine or headache. May repeat in 2 hours if headache persists or recurs.      . traMADol (ULTRAM) 50 MG tablet       . traZODone (DESYREL) 50 MG tablet Take one tablet as needed for insomnia.  30 tablet  0   No current  facility-administered medications on file prior to visit.    Allergies:  Allergies  Allergen Reactions  . Codeine Itching  . Doxycycline Other (See Comments)    Reaction unknown  . Tetracycline Hcl Other (See Comments)    Reaction unknown     Review of Systems:  CONSTITUTIONAL: No fevers, chills, night sweats, + 2lb weight loss.   EYES: No visual changes or eye pain ENT: No hearing changes.  No history of nose bleeds.   RESPIRATORY: No cough, wheezing and shortness of breath.   CARDIOVASCULAR: Negative for chest pain, and palpitations.   GI: Negative for abdominal discomfort, blood in stools or black stools.  No recent change in bowel habits.   GU:  No history of incontinence.   MUSCLOSKELETAL: + history of joint pain or swelling.  No myalgias.   SKIN: Negative for lesions, rash, and itching.   ENDOCRINE: Negative for cold or heat intolerance, polydipsia or goiter.   PSYCH:  + depression or anxiety symptoms.   NEURO: As Above.   Vital Signs:  BP 118/70  Pulse 100  Ht 5' 2.6" (1.59 m)  Wt 104 lb 2 oz (47.231 kg)  BMI 18.68 kg/m2  SpO2 99%  Neurological Exam: MENTAL STATUS including orientation to time, place, person, recent and remote memory, attention span and concentration, language, and fund of knowledge is normal.  Speech is not dysarthric.  CRANIAL NERVES: Pupils equal round and reactive to light.  Normal conjugate, extra-ocular eye movements in all directions of gaze.  No ptosis. Normal facial sensation.  Face is symmetric. Palate elevates symmetrically.  Tongue is midline.  MOTOR:  Motor strength is 5/5 in all extremities.  Reduced shoulder ROM with arm abduction due to pain.  No pronator drift.  Tone is normal.      MSRs:  Reflexes are 3+/4 throughout, except 1+ Achilles bilaterally.  SENSORY: Vibration is intact throughout (improved). Normal and symmetric perception of light touch and pinprick.   COORDINATION/GAIT: Gait is narrow based and stable gait  (improved).   Data: MRI lumbar spine wo contrast 05/08/2013: Shallow disc protrusions at L4-5 and L5-S1 without stenosis.  MRI cervical spine wo contrast 06/05/2013: Mild degenerative changes, similar to 2008. Negative for neural impingement.  EMG 05/20/2013: There is electrophysiological evidence of an old intraspinal canal lesions (i.e. radiculopathy) affecting bilateral L5 nerve root/segment. Overall, these findings are mild in degree electrically.  There is no evidence of a generalized sensorimotor polyneuropathy affecting the lower extremities.   EMG 08/21/2013: This is a normal study of the upper extremities. In particular, there is no evidence of a generalized sensorimotor polyneuropathy or cervical motor radiculopathy affecting the arms.  Labs 2/23/ 2015:  Vitamin B12 612, vitamin  B1 14, vitamin B6 23.8, copper 72, folate >20, celiac panel nonspecific mild IgG gliadin elevation, SPEP/UPEP no M protein, zinc 44*   IMPRESSION/PLAN: 1.  Bilateral arms and leg paresthesias - resolved   - Clinically doing much better, paresthesias have now resolved.  (?potential etiology refeeding syndrome vs zinc deficiency)  - No evidence of large fiber neuropathy on EMG.  There is mild L5 radiculopathy bilaterally.  - MRI lumbar spine with mild disc protrusion at L4-5 and L5-S1, normal C-spine  - Since she is doing well, ok to taper gabapentin 300mg  to 1 tab qhs x 2 weeks, then stop. 2.  Zinc deficiency, may be contributing to #1   - Can cause myelopathy signs and symptoms as well as paresthesias  - Continue zinc 15mg  daily for now  - Check zinc level, if within normal limits, stop supplements 3. Joint-related pain of shoulders, hips, and knees  - New soreness of joints  - Follow-up with PCP 4. Generalized deconditioning due to malnutrition  - Encouraged to eat a well-balanced diet 5.  Return to clinic as needed   The duration of this appointment visit was 25 minutes of face-to-face time with  the patient.  Greater than 50% of this time was spent in counseling, explanation of diagnosis, planning of further management, and coordination of care.   Thank you for allowing me to participate in patient's care.  If I can answer any additional questions, I would be pleased to do so.    Sincerely,    Patric Buckhalter K. Posey Pronto, DO

## 2013-08-27 ENCOUNTER — Other Ambulatory Visit: Payer: Self-pay | Admitting: Dermatology

## 2013-08-29 LAB — ZINC: Zinc: 55 ug/dL — ABNORMAL LOW (ref 60–130)

## 2013-09-03 ENCOUNTER — Ambulatory Visit (INDEPENDENT_AMBULATORY_CARE_PROVIDER_SITE_OTHER): Payer: Federal, State, Local not specified - PPO | Admitting: Family Medicine

## 2013-09-03 ENCOUNTER — Encounter: Payer: Self-pay | Admitting: Family Medicine

## 2013-09-03 VITALS — BP 120/70 | HR 90 | Wt 105.0 lb

## 2013-09-03 DIAGNOSIS — F321 Major depressive disorder, single episode, moderate: Secondary | ICD-10-CM

## 2013-09-03 MED ORDER — DULOXETINE HCL 30 MG PO CPEP: 30.0000 mg | ORAL_CAPSULE | Freq: Every day | ORAL | Status: DC

## 2013-09-03 NOTE — Patient Instructions (Addendum)
After you see the psychiatrist call and let me know what the game plan is with the psychiatrist The next time you have anxiety take a half a Xanax and see if you get the same benefit

## 2013-09-03 NOTE — Progress Notes (Signed)
   Subjective:    Patient ID: Terri Ayala, female    DOB: 1956/01/10, 58 y.o.   MRN: [id number]  HPI She is here for consult concerning continued difficulty with aches and pains in his shoulders arms and feet. She presently is on Neurontin and is using that for the foot neuropathy and states that that has been gotten much better. She has been to behavioral health and was on Cymbalta however ran out about 2 weeks ago. She has an appointment scheduled for the 16th. She recently had a very thorough neurologic evaluation all of which came out negative. The notes were reviewed. She continues on medications listed in the chart.   Review of Systems     Objective:   Physical Exam Alert and in no distress with appropriate affect       Assessment & Plan:  Major depressive disorder, single episode, moderate - Plan: DULoxetine (CYMBALTA) 30 MG capsule  her pain could possibly be fibromyalgia in nature. There is certainly a depression component to this. I will place her back on Cymbalta which she was on in the past. She will followup with her psychiatrist. I explained that hopefully the Cymbalta will give [place] dual coverage of helping with depression and with her chronic pain. She will call after she has seen the psychiatrist.

## 2013-09-12 ENCOUNTER — Ambulatory Visit (HOSPITAL_COMMUNITY): Payer: Self-pay | Admitting: Psychiatry

## 2013-10-01 ENCOUNTER — Other Ambulatory Visit: Payer: Self-pay

## 2013-10-01 DIAGNOSIS — Z1231 Encounter for screening mammogram for malignant neoplasm of breast: Secondary | ICD-10-CM

## 2013-10-16 ENCOUNTER — Other Ambulatory Visit: Payer: Self-pay | Admitting: Internal Medicine

## 2013-10-16 DIAGNOSIS — M8580 Other specified disorders of bone density and structure, unspecified site: Secondary | ICD-10-CM

## 2013-10-21 ENCOUNTER — Ambulatory Visit
Admission: RE | Admit: 2013-10-21 | Discharge: 2013-10-21 | Disposition: A | Discharge status: Home / Self Care | Payer: Federal, State, Local not specified - PPO | Source: Ambulatory Visit

## 2013-10-21 DIAGNOSIS — Z1231 Encounter for screening mammogram for malignant neoplasm of breast: Secondary | ICD-10-CM

## 2013-10-29 ENCOUNTER — Ambulatory Visit
Admission: RE | Admit: 2013-10-29 | Discharge: 2013-10-29 | Disposition: A | Discharge status: Home / Self Care | Payer: Federal, State, Local not specified - PPO | Source: Ambulatory Visit | Attending: Internal Medicine | Admitting: Internal Medicine

## 2013-10-29 DIAGNOSIS — M8580 Other specified disorders of bone density and structure, unspecified site: Secondary | ICD-10-CM

## 2013-11-20 ENCOUNTER — Other Ambulatory Visit: Payer: Self-pay | Admitting: Internal Medicine

## 2013-11-20 ENCOUNTER — Ambulatory Visit
Admission: RE | Admit: 2013-11-20 | Discharge: 2013-11-20 | Disposition: A | Discharge status: Home / Self Care | Payer: Federal, State, Local not specified - PPO | Source: Ambulatory Visit | Attending: Internal Medicine | Admitting: Internal Medicine

## 2013-11-20 DIAGNOSIS — R059 Cough, unspecified: Secondary | ICD-10-CM

## 2013-11-20 DIAGNOSIS — R05 Cough: Secondary | ICD-10-CM

## 2013-12-05 ENCOUNTER — Encounter: Payer: Self-pay | Admitting: Internal Medicine

## 2014-02-27 ENCOUNTER — Ambulatory Visit
Admission: RE | Admit: 2014-02-27 | Discharge: 2014-02-27 | Disposition: A | Discharge status: Home / Self Care | Payer: Federal, State, Local not specified - PPO | Source: Ambulatory Visit | Attending: Internal Medicine | Admitting: Internal Medicine

## 2014-02-27 ENCOUNTER — Other Ambulatory Visit: Payer: Self-pay | Admitting: Internal Medicine

## 2014-02-27 DIAGNOSIS — R05 Cough: Secondary | ICD-10-CM

## 2014-02-27 DIAGNOSIS — R059 Cough, unspecified: Secondary | ICD-10-CM

## 2014-03-03 ENCOUNTER — Other Ambulatory Visit: Payer: Self-pay | Admitting: Internal Medicine

## 2014-03-03 DIAGNOSIS — R918 Other nonspecific abnormal finding of lung field: Secondary | ICD-10-CM

## 2014-03-07 ENCOUNTER — Ambulatory Visit
Admission: RE | Admit: 2014-03-07 | Discharge: 2014-03-07 | Disposition: A | Discharge status: Home / Self Care | Payer: Federal, State, Local not specified - PPO | Source: Ambulatory Visit | Attending: Internal Medicine | Admitting: Internal Medicine

## 2014-03-07 DIAGNOSIS — R918 Other nonspecific abnormal finding of lung field: Secondary | ICD-10-CM

## 2014-03-07 MED ORDER — IOHEXOL 300 MG/ML  SOLN
75.0000 mL | Freq: Once | INTRAMUSCULAR | Status: AC | PRN
  Administered 2014-03-07: 75 mL via INTRAVENOUS

## 2014-04-02 ENCOUNTER — Encounter (HOSPITAL_COMMUNITY): Payer: Self-pay | Admitting: Emergency Medicine

## 2014-04-02 ENCOUNTER — Emergency Department (HOSPITAL_COMMUNITY)
Admission: EM | Admit: 2014-04-02 | Discharge: 2014-04-03 | Disposition: A | Discharge status: Other Acute Inpatient Hospital | Payer: Federal, State, Local not specified - PPO | Attending: Emergency Medicine | Admitting: Emergency Medicine

## 2014-04-02 DIAGNOSIS — E785 Hyperlipidemia, unspecified: Secondary | ICD-10-CM | POA: Insufficient documentation

## 2014-04-02 DIAGNOSIS — M810 Age-related osteoporosis without current pathological fracture: Secondary | ICD-10-CM | POA: Diagnosis not present

## 2014-04-02 DIAGNOSIS — Z79899 Other long term (current) drug therapy: Secondary | ICD-10-CM | POA: Diagnosis not present

## 2014-04-02 DIAGNOSIS — I471 Supraventricular tachycardia: Secondary | ICD-10-CM | POA: Insufficient documentation

## 2014-04-02 DIAGNOSIS — F419 Anxiety disorder, unspecified: Secondary | ICD-10-CM | POA: Insufficient documentation

## 2014-04-02 DIAGNOSIS — F1310 Sedative, hypnotic or anxiolytic abuse, uncomplicated: Secondary | ICD-10-CM | POA: Diagnosis not present

## 2014-04-02 DIAGNOSIS — F1020 Alcohol dependence, uncomplicated: Secondary | ICD-10-CM | POA: Diagnosis not present

## 2014-04-02 DIAGNOSIS — F1010 Alcohol abuse, uncomplicated: Secondary | ICD-10-CM | POA: Diagnosis present

## 2014-04-02 DIAGNOSIS — Z7982 Long term (current) use of aspirin: Secondary | ICD-10-CM | POA: Insufficient documentation

## 2014-04-02 DIAGNOSIS — G43909 Migraine, unspecified, not intractable, without status migrainosus: Secondary | ICD-10-CM | POA: Insufficient documentation

## 2014-04-02 LAB — CBC WITH DIFFERENTIAL/PLATELET
BASOS ABS: 0.1 10*3/uL (ref 0.0–0.1)
BASOS PCT: 1 % (ref 0–1)
Eosinophils Absolute: 0 10*3/uL (ref 0.0–0.7)
Eosinophils Relative: 0 % (ref 0–5)
HCT: 42.1 % (ref 36.0–46.0)
Hemoglobin: 14.5 g/dL (ref 12.0–15.0)
LYMPHS PCT: 24 % (ref 12–46)
Lymphs Abs: 1.3 10*3/uL (ref 0.7–4.0)
MCH: 34.5 pg — AB (ref 26.0–34.0)
MCHC: 34.4 g/dL (ref 30.0–36.0)
MCV: 100.2 fL — ABNORMAL HIGH (ref 78.0–100.0)
Monocytes Absolute: 0.3 10*3/uL (ref 0.1–1.0)
Monocytes Relative: 6 % (ref 3–12)
NEUTROS ABS: 3.7 10*3/uL (ref 1.7–7.7)
Neutrophils Relative %: 69 % (ref 43–77)
Platelets: 110 10*3/uL — ABNORMAL LOW (ref 150–400)
RBC: 4.2 MIL/uL (ref 3.87–5.11)
RDW: 13 % (ref 11.5–15.5)
WBC: 5.4 10*3/uL (ref 4.0–10.5)

## 2014-04-02 LAB — COMPREHENSIVE METABOLIC PANEL
ALK PHOS: 114 U/L (ref 39–117)
ALT: 51 U/L — AB (ref 0–35)
AST: 295 U/L — AB (ref 0–37)
Albumin: 4.7 g/dL (ref 3.5–5.2)
Anion gap: 17 — ABNORMAL HIGH (ref 5–15)
BUN: 10 mg/dL (ref 6–23)
CHLORIDE: 101 mmol/L (ref 96–112)
CO2: 21 mmol/L (ref 19–32)
Calcium: 8.9 mg/dL (ref 8.4–10.5)
Creatinine, Ser: 0.6 mg/dL (ref 0.50–1.10)
GFR calc Af Amer: >90 mL/min (ref >90)
GFR calc non Af Amer: >90 mL/min (ref >90)
Glucose, Bld: 81 mg/dL (ref 70–99)
Potassium: 4.2 mmol/L (ref 3.5–5.1)
Sodium: 139 mmol/L (ref 135–145)
TOTAL PROTEIN: 8.3 g/dL (ref 6.0–8.3)
Total Bilirubin: 0.7 mg/dL (ref 0.3–1.2)

## 2014-04-02 LAB — RAPID URINE DRUG SCREEN, HOSP PERFORMED
Amphetamines: NOT DETECTED
BARBITURATES: NOT DETECTED
BENZODIAZEPINES: POSITIVE — AB
COCAINE: NOT DETECTED
OPIATES: NOT DETECTED
Tetrahydrocannabinol: NOT DETECTED

## 2014-04-02 LAB — ETHANOL: Alcohol, Ethyl (B): 346 mg/dL — ABNORMAL HIGH (ref 0–9)

## 2014-04-02 MED ORDER — IBUPROFEN 200 MG PO TABS: 600.0000 mg | ORAL_TABLET | Freq: Three times a day (TID) | ORAL | Status: DC | PRN

## 2014-04-02 MED ORDER — LORAZEPAM 1 MG PO TABS
0.0000 mg | ORAL_TABLET | Freq: Four times a day (QID) | ORAL | Status: DC
  Administered 2014-04-02: 2 mg via ORAL
  Administered 2014-04-03: 1 mg via ORAL
  Filled 2014-04-02: qty 1
  Filled 2014-04-02: qty 2

## 2014-04-02 MED ORDER — LORAZEPAM 1 MG PO TABS: 0.0000 mg | ORAL_TABLET | Freq: Two times a day (BID) | ORAL | Status: DC

## 2014-04-02 MED ORDER — ZOLPIDEM TARTRATE 5 MG PO TABS: 5.0000 mg | ORAL_TABLET | Freq: Every evening | ORAL | Status: DC | PRN

## 2014-04-02 MED ORDER — ONDANSETRON HCL 4 MG PO TABS: 4.0000 mg | ORAL_TABLET | Freq: Three times a day (TID) | ORAL | Status: DC | PRN

## 2014-04-02 MED ORDER — NICOTINE 21 MG/24HR TD PT24
21.0000 mg | MEDICATED_PATCH | Freq: Every day | TRANSDERMAL | Status: DC
  Filled 2014-04-02: qty 1

## 2014-04-02 MED ORDER — ALUM & MAG HYDROXIDE-SIMETH 200-200-20 MG/5ML PO SUSP: 30.0000 mL | ORAL | Status: DC | PRN

## 2014-04-02 NOTE — ED Notes (Signed)
Pt Voluntary

## 2014-04-02 NOTE — ED Notes (Signed)
Pt reports she is here to get detox from alcohol [facility] to get [facility]. Pt reports she has had a rough week and has drank to much states she is an alcoholic. Pt states she feels like she is going through withdraw. Pt reports last drink 2 hours ago. Pt is alert and oriented. Pt denies SI/HI.

## 2014-04-02 NOTE — ED Notes (Signed)
TTS at bedside. 

## 2014-04-02 NOTE — ED Notes (Signed)
Pt PCP concerned because pt was upset thinking she may lose her job tomorrow and was concern for pt safety. Pt reports she has no intentions on harming herself or anyone else.

## 2014-04-02 NOTE — ED Notes (Signed)
Pt unable to void at this time. 

## 2014-04-02 NOTE — ED Provider Notes (Signed)
CSN: [id number]     Arrival date & time 04/02/14  2029 History  This chart was scribed for Antonietta Breach, PA-C, working with Houston Siren III, by Steva Colder, ED Scribe. The patient was seen in room [facility]4/[facility]4 at 10:11 PM.    Chief Complaint  Patient presents with  . Detox ETOH     The history is provided by the patient. No language interpreter was used.    HPI Comments: Terri Ayala is a 59 y.o. female who presents to the Emergency Department complaining of detox from ETOH onset PTA. She reports that she is going through withdrawal symptoms from alcohol. She notes that she is shaking primarily. She notes that she is typically anxious and she takes xanax for that. She reports that she started drinking again a couple weeks ago because of a friends mother becoming ill. She notes that she typically drinks 1 bottle of wine daily. She reports that she was sober for 1 month before her relapse. She notes that her last drink was today. She reports that she would like to get over her withdrawal and stop drinking. She states that she is having associated symptoms of nausea. She denies SI, HI, vomiting, and any other symptoms. Denies drug use. She voices that she has been through a program before. She voices concern for a bump that is on her right middle finger that appeared and got larger in the past couple weeks.   Past Medical History  Diagnosis Date  . Low back pain   . Anxiety   . Migraines   . GOITER, MULTINODULAR 08/17/2009  . OSTEOPOROSIS 08/17/2009  . ASYMPTOMATIC POSTMENOPAUSAL STATUS 08/17/2009  . DYSPHAGIA 06/19/2007  . Supraventricular tachycardia   . Hyperlipidemia   . Mitral valve prolapse   . PONV (postoperative nausea and vomiting)    Past Surgical History  Procedure Laterality Date  . Laporoscopic abdominal surgery    . Cervix surgery    . Cholecystectomy N/A 03/14/2013    Procedure: LAPAROSCOPIC CHOLECYSTECTOMY WITH ATTEMPTED INTRAOPERATIVE CHOLANGIOGRAM;  Surgeon: Harl Bowie, MD;  Location: [facility];  Service: General;  Laterality: N/A;   Family History  Problem Relation Age of Onset  . Thyroid disease Neg Hx   . Goiter Neg Hx   . Colon cancer Father     Deceased, 23  . Osteoporosis Mother     Living, 8  . Healthy Brother    History  Substance Use Topics  . Smoking status: Never Smoker   . Smokeless tobacco: Never Used  . Alcohol Use: 2.4 oz/week    4 Glasses of wine per week   OB History    No data available     Review of Systems  Gastrointestinal: Positive for nausea. Negative for vomiting.  Psychiatric/Behavioral: Negative for suicidal ideas.       Negative HI  All other systems reviewed and are negative.     Allergies  Codeine; Doxycycline; and Tetracycline hcl  Home Medications   Prior to Admission medications   Medication Sig Start Date End Date Taking? Authorizing Provider  ALPRAZolam Duanne Moron) 0.5 MG tablet Take 1 tablet (0.5 mg total) by mouth 2 (two) times daily as needed for anxiety. Patient taking differently: Take 0.5 mg by mouth daily.  08/23/13  Yes Denita Lung, MD  aspirin 81 MG tablet Take 1 tablet (81 mg total) by mouth daily. 06/21/13  Yes Nena Polio, PA-C  calcium carbonate 200 MG capsule Take 200 mg by mouth daily.  Yes Historical Provider, MD  cholecalciferol (VITAMIN D) 1000 UNITS tablet Take 1 tablet (1,000 Units total) by mouth daily. 06/21/13  Yes Nena Polio, PA-C  glucosamine-chondroitin 500-400 MG tablet Take 1 tablet by mouth daily. 06/21/13  Yes Nena Polio, PA-C  norethindrone-ethinyl estradiol (FEMHRT 1/5) 1-5 MG-MCG TABS Take 1 tablet by mouth daily. 06/21/13  Yes Nena Polio, PA-C  pantoprazole (PROTONIX) 40 MG tablet Take 1 tablet (40 mg total) by mouth daily. 06/21/13  Yes Nena Polio, PA-C  SUMAtriptan (IMITREX) 100 MG tablet Take 100 mg by mouth every 2 (two) hours as needed for migraine or headache. May repeat in 2 hours if headache persists or recurs.   Yes Historical Provider, MD   alendronate (FOSAMAX) 70 MG tablet Take 1 tablet (70 mg total) by mouth every 7 (seven) days. Take with a full glass of water on an empty stomach. Patient not taking: Reported on 04/02/2014 06/21/13   Nena Polio, PA-C  diltiazem (CARDIZEM CD) 240 MG 24 hr capsule Take 1 capsule (240 mg total) by mouth daily. Patient taking differently: Take 240 mg by mouth daily as needed (SVT).  10/04/12 10/04/13  Denita Lung, MD  DULoxetine (CYMBALTA) 20 MG capsule Take 1 capsule (20 mg total) by mouth daily. Patient not taking: Reported on 04/02/2014 06/21/13   Nena Polio, PA-C  DULoxetine (CYMBALTA) 30 MG capsule Take 1 capsule (30 mg total) by mouth daily. Patient not taking: Reported on 04/02/2014 09/03/13   Denita Lung, MD  gabapentin (NEURONTIN) 100 MG capsule Take 2 capsules (200 mg total) by mouth 3 (three) times daily. Patient not taking: Reported on 04/02/2014 06/21/13   Nena Polio, PA-C  metoCLOPramide (REGLAN) 10 MG tablet Take 1 tablet (10 mg total) by mouth 3 (three) times daily before meals. Patient not taking: Reported on 04/02/2014 06/21/13   Nena Polio, PA-C  simvastatin (ZOCOR) 20 MG tablet Take 1 tablet (20 mg total) by mouth at bedtime. Patient not taking: Reported on 04/02/2014 06/21/13   Nena Polio, PA-C  traZODone (DESYREL) 50 MG tablet Take one tablet as needed for insomnia. Patient not taking: Reported on 04/02/2014 06/21/13   Nena Polio, PA-C   BP 141/84 mmHg  Temp(Src) 98.7 F (37.1 C) (Oral)  Resp 16  SpO2 96%  Physical Exam  Constitutional: She is oriented to person, place, and time. She appears well-developed and well-nourished. No distress.  Nontoxic/nonseptic appearing  HENT:  Head: Normocephalic and atraumatic.  Eyes: Conjunctivae and EOM are normal. No scleral icterus.  Neck: Normal range of motion.  Cardiovascular: Normal rate, regular rhythm and intact distal pulses.   Pulmonary/Chest: Effort normal. No respiratory distress.  Respirations even and unlabored   Musculoskeletal: Normal range of motion.  Neurological: She is alert and oriented to person, place, and time. She exhibits normal muscle tone. Coordination normal.  GCS 15. Speech is goal oriented. Patient moves extremities without ataxia; no tremors noted.  Skin: Skin is warm and dry. No rash noted. She is not diaphoretic. No erythema. No pallor.  Psychiatric: Her behavior is normal. Her mood appears anxious. Her speech is rapid and/or pressured. She expresses no homicidal and no suicidal ideation.  Nursing note and vitals reviewed.   ED Course  Procedures (including critical care time) DIAGNOSTIC STUDIES: Oxygen Saturation is 96% on room air, normal by my interpretation.    COORDINATION OF CARE: 10:17 PM-Discussed treatment plan which includes CBC, CMAT, and drug screening with pt at bedside and pt agreed to plan.   Labs Review  Labs Reviewed  CBC WITH DIFFERENTIAL/PLATELET - Abnormal; Notable for the following:    MCV 100.2 (*)    MCH 34.5 (*)    Platelets 110 (*)    All other components within normal limits  COMPREHENSIVE METABOLIC PANEL - Abnormal; Notable for the following:    AST 295 (*)    ALT 51 (*)    Anion gap 17 (*)    All other components within normal limits  URINE RAPID DRUG SCREEN (HOSP PERFORMED) - Abnormal; Notable for the following:    Benzodiazepines POSITIVE (*)    All other components within normal limits  ETHANOL - Abnormal; Notable for the following:    Alcohol, Ethyl (B) 346 (*)    All other components within normal limits    Imaging Review No results found.   EKG Interpretation None      MDM   Final diagnoses:  Alcohol use disorder, moderate, dependence    Patient presenting requesting ETOH detox. She has been medically cleared and referred to [facility] for inpt tx, per TTS consult note. Disposition to be determined by oncoming ED provider. No bed available at [facility] at this time.  I personally performed the services described in this  documentation, which was scribed in my presence. The recorded information has been reviewed and is accurate.    Antonietta Breach, PA-C 04/03/14 [place], MD 04/03/14 [phone]

## 2014-04-03 ENCOUNTER — Encounter (HOSPITAL_COMMUNITY): Payer: Self-pay | Admitting: *Deleted

## 2014-04-03 ENCOUNTER — Inpatient Hospital Stay (HOSPITAL_COMMUNITY)
Admission: AD | Admit: 2014-04-03 | Discharge: 2014-04-07 | DRG: 897 | Disposition: A | Discharge status: Home / Self Care | Payer: Federal, State, Local not specified - PPO | Source: Intra-hospital | Attending: Psychiatry | Admitting: Psychiatry

## 2014-04-03 DIAGNOSIS — Z23 Encounter for immunization: Secondary | ICD-10-CM | POA: Diagnosis not present

## 2014-04-03 DIAGNOSIS — E785 Hyperlipidemia, unspecified: Secondary | ICD-10-CM | POA: Diagnosis present

## 2014-04-03 DIAGNOSIS — F329 Major depressive disorder, single episode, unspecified: Secondary | ICD-10-CM | POA: Diagnosis present

## 2014-04-03 DIAGNOSIS — F419 Anxiety disorder, unspecified: Secondary | ICD-10-CM | POA: Diagnosis present

## 2014-04-03 DIAGNOSIS — G4700 Insomnia, unspecified: Secondary | ICD-10-CM | POA: Diagnosis present

## 2014-04-03 DIAGNOSIS — Y908 Blood alcohol level of 240 mg/100 ml or more: Secondary | ICD-10-CM | POA: Diagnosis present

## 2014-04-03 DIAGNOSIS — K760 Fatty (change of) liver, not elsewhere classified: Secondary | ICD-10-CM | POA: Diagnosis present

## 2014-04-03 DIAGNOSIS — Z800 Family history of malignant neoplasm of digestive organs: Secondary | ICD-10-CM

## 2014-04-03 DIAGNOSIS — M810 Age-related osteoporosis without current pathological fracture: Secondary | ICD-10-CM | POA: Diagnosis present

## 2014-04-03 DIAGNOSIS — F1020 Alcohol dependence, uncomplicated: Secondary | ICD-10-CM | POA: Diagnosis present

## 2014-04-03 DIAGNOSIS — F331 Major depressive disorder, recurrent, moderate: Secondary | ICD-10-CM | POA: Insufficient documentation

## 2014-04-03 MED ORDER — PNEUMOCOCCAL VAC POLYVALENT 25 MCG/0.5ML IJ INJ
0.5000 mL | INJECTION | INTRAMUSCULAR | Status: AC
  Administered 2014-04-04: 0.5 mL via INTRAMUSCULAR

## 2014-04-03 MED ORDER — LORAZEPAM 1 MG PO TABS: 0.0000 mg | ORAL_TABLET | Freq: Two times a day (BID) | ORAL | Status: DC

## 2014-04-03 MED ORDER — LORAZEPAM 1 MG PO TABS
ORAL_TABLET | ORAL | Status: AC
  Filled 2014-04-03: qty 1

## 2014-04-03 MED ORDER — ZOLPIDEM TARTRATE 5 MG PO TABS
5.0000 mg | ORAL_TABLET | Freq: Every evening | ORAL | Status: DC | PRN
  Administered 2014-04-06: 5 mg via ORAL
  Filled 2014-04-03 (×2): qty 1

## 2014-04-03 MED ORDER — ADULT MULTIVITAMIN W/MINERALS CH
1.0000 | ORAL_TABLET | Freq: Every day | ORAL | Status: DC
  Administered 2014-04-03 – 2014-04-07 (×5): 1 via ORAL
  Filled 2014-04-03 (×7): qty 1

## 2014-04-03 MED ORDER — ADULT MULTIVITAMIN W/MINERALS CH
ORAL_TABLET | ORAL | Status: AC
  Filled 2014-04-03: qty 1

## 2014-04-03 MED ORDER — IBUPROFEN 600 MG PO TABS: 600.0000 mg | ORAL_TABLET | Freq: Three times a day (TID) | ORAL | Status: DC | PRN

## 2014-04-03 MED ORDER — LORAZEPAM 1 MG PO TABS
1.0000 mg | ORAL_TABLET | Freq: Four times a day (QID) | ORAL | Status: AC
  Administered 2014-04-03 – 2014-04-04 (×4): 1 mg via ORAL
  Filled 2014-04-03 (×4): qty 1

## 2014-04-03 MED ORDER — THIAMINE HCL 100 MG/ML IJ SOLN
100.0000 mg | Freq: Once | INTRAMUSCULAR | Status: AC
  Administered 2014-04-03: 100 mg via INTRAMUSCULAR

## 2014-04-03 MED ORDER — NICOTINE 21 MG/24HR TD PT24
21.0000 mg | MEDICATED_PATCH | Freq: Every day | TRANSDERMAL | Status: DC
  Filled 2014-04-03: qty 1

## 2014-04-03 MED ORDER — VITAMIN B-1 100 MG PO TABS
100.0000 mg | ORAL_TABLET | Freq: Every day | ORAL | Status: DC
  Administered 2014-04-04 – 2014-04-07 (×4): 100 mg via ORAL
  Filled 2014-04-03 (×6): qty 1

## 2014-04-03 MED ORDER — LORAZEPAM 1 MG PO TABS: 0.0000 mg | ORAL_TABLET | Freq: Four times a day (QID) | ORAL | Status: DC

## 2014-04-03 MED ORDER — LOPERAMIDE HCL 2 MG PO CAPS: 2.0000 mg | ORAL_CAPSULE | ORAL | Status: AC | PRN

## 2014-04-03 MED ORDER — LORAZEPAM 1 MG PO TABS
1.0000 mg | ORAL_TABLET | Freq: Four times a day (QID) | ORAL | Status: AC | PRN
  Administered 2014-04-03: 1 mg via ORAL
  Filled 2014-04-03 (×2): qty 1

## 2014-04-03 MED ORDER — ONDANSETRON 4 MG PO TBDP: 4.0000 mg | ORAL_TABLET | Freq: Four times a day (QID) | ORAL | Status: AC | PRN

## 2014-04-03 MED ORDER — LORAZEPAM 1 MG PO TABS
1.0000 mg | ORAL_TABLET | Freq: Three times a day (TID) | ORAL | Status: AC
  Administered 2014-04-04 – 2014-04-05 (×3): 1 mg via ORAL
  Filled 2014-04-03 (×2): qty 1

## 2014-04-03 MED ORDER — THIAMINE HCL 100 MG/ML IJ SOLN
INTRAMUSCULAR | Status: AC
  Filled 2014-04-03: qty 2

## 2014-04-03 MED ORDER — ONDANSETRON HCL 4 MG PO TABS: 4.0000 mg | ORAL_TABLET | Freq: Three times a day (TID) | ORAL | Status: DC | PRN

## 2014-04-03 MED ORDER — ALUM & MAG HYDROXIDE-SIMETH 200-200-20 MG/5ML PO SUSP: 30.0000 mL | ORAL | Status: DC | PRN

## 2014-04-03 MED ORDER — HYDROXYZINE HCL 25 MG PO TABS: 25.0000 mg | ORAL_TABLET | Freq: Four times a day (QID) | ORAL | Status: AC | PRN

## 2014-04-03 MED ORDER — LORAZEPAM 1 MG PO TABS
1.0000 mg | ORAL_TABLET | Freq: Every day | ORAL | Status: AC
  Administered 2014-04-07: 1 mg via ORAL
  Filled 2014-04-03: qty 1

## 2014-04-03 MED ORDER — LORAZEPAM 1 MG PO TABS
1.0000 mg | ORAL_TABLET | Freq: Two times a day (BID) | ORAL | Status: AC
  Administered 2014-04-05 – 2014-04-06 (×2): 1 mg via ORAL
  Filled 2014-04-03 (×2): qty 1

## 2014-04-03 NOTE — ED Notes (Signed)
Patient is alert and oriented x3.  She was DC to [place] She was DC ambulatory under her own power to [place].  V/S stable.  He was not showing any signs of distress on DC

## 2014-04-03 NOTE — Consult Note (Signed)
[facility] Consult   Reason for Consult:  Alcohol use disorder Referring Physician: EDP Patient Identification: Terri Ayala MRN:  [id number] Principal Diagnosis: Alcohol abuse Diagnosis:   Patient Active Problem List   Diagnosis Date Noted  . Alcohol use disorder, severe [F10.10] 06/18/2013    Priority: High  . MDD (major depressive disorder) [F32.2] 06/18/2013  . Lumbosacral radiculopathy at L5 [M54.17] 05/20/2013  . Protein-calorie malnutrition, severe [E43] 03/18/2013  . Nonspecific elevation of levels of transaminase or lactic acid dehydrogenase (LDH) [R74.0] 03/16/2013  . Hepatic steatosis [K76.0] 03/16/2013  . Chronic cholecystitis [K81.1] 03/14/2013  . Cervical arthritis [M46.92] 10/12/2012  . Palpitations [R00.2] 11/29/2010  . SVT (supraventricular tachycardia) [I47.1] 11/29/2010  . Mitral valve prolapse [I34.1] 11/29/2010  . Decreased libido [R68.82] 11/15/2010  . GOITER, MULTINODULAR [E04.2] 08/17/2009  . ANXIETY [F41.1] 08/17/2009  . MIGRAINE HEADACHE [G43.909] 08/17/2009  . HEARING LOSS [H91.90] 08/17/2009  . OSTEOPOROSIS [M81.0] 08/17/2009  . ASYMPTOMATIC POSTMENOPAUSAL STATUS [Z78.0] 08/17/2009  . DYSPHAGIA [R13.19] 06/19/2007    Total Time spent with patient: 45 minutes  Subjective:   Terri Ayala is a 59 y.o. female patient admitted with Alcohol dependence/tion.  HPI:  Caucasian female, 54 years was evaluated this morning seeking Alcohol detox.  On arrival last night Alcohol level was 346.   Patient was brought in by a friend for alcohol intoxication.  Patient reported that she started drinking in her late 11's and she has been drinking off and on.  Patient reported her last detox was last spring and that she did a day summer program at [facility] for out patient rehabilitation.  Patient reported that she relapsed after few months the program ended.  She reported that her drinking has increased due to stress from her job.  She reports that  her boss makes her feel stressed ou and that other staff members feels that way too.  Patient denies SI/HI/AVH.  She denies Alcohol withdrawal seizures but reported tremors and excessive shaking.  Patient has a hx of depression and takes medications prescribed by her PMD.  Patient is accepted for in patient Psychiatric hospital for detox from Alcohol.  Patient is assigned a bed at our inpatient Psychiatric unit and is waiting for transportation.  HPI Elements:   Location:  Alcohol use disorder, severe, dependence. Quality:  tremors, shakes, nausea. Severity:  severe. Duration:  since 20 years off and on. Context:  Seeking detox treatment.  Past Medical History:  Past Medical History  Diagnosis Date  . Low back pain   . Anxiety   . Migraines   . GOITER, MULTINODULAR 08/17/2009  . OSTEOPOROSIS 08/17/2009  . ASYMPTOMATIC POSTMENOPAUSAL STATUS 08/17/2009  . DYSPHAGIA 06/19/2007  . Supraventricular tachycardia   . Hyperlipidemia   . Mitral valve prolapse   . PONV (postoperative nausea and vomiting)     Past Surgical History  Procedure Laterality Date  . Laporoscopic abdominal surgery    . Cervix surgery    . Cholecystectomy N/A 03/14/2013    Procedure: LAPAROSCOPIC CHOLECYSTECTOMY WITH ATTEMPTED INTRAOPERATIVE CHOLANGIOGRAM;  Surgeon: Harl Bowie, MD;  Location: [place];  Service: General;  Laterality: N/A;   Family History:  Family History  Problem Relation Age of Onset  . Thyroid disease Neg Hx   . Goiter Neg Hx   . Colon cancer Father     Deceased, 60  . Osteoporosis Mother     Living, 64  . Healthy Brother    Social History:  History  Alcohol Use  .  2.4 oz/week  . 4 Glasses of wine per week     History  Drug Use No    History   Social History  . Marital Status: Single    Spouse Name: N/A    Number of Children: N/A  . Years of Education: N/A   Occupational History  . Biologist    Social History Main Topics  . Smoking status: Never Smoker   . Smokeless  tobacco: Never Used  . Alcohol Use: 2.4 oz/week    4 Glasses of wine per week  . Drug Use: No  . Sexual Activity: No   Other Topics Concern  . None   Social History Narrative   Lives alone.  She has a BS biology.   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   Additional Social History:    Pain Medications: Pt did not bring in a list of her meds. Prescriptions: Xanax .5mg  once per day in AM; Pt does not remember the other medications, did not bring them. Over the Counter: Pt did not remember what supplements. History of alcohol / drug use?: Yes Withdrawal Symptoms: Cramps, Diarrhea, Fever / Chills, Nausea / Vomiting, Patient aware of relationship between substance abuse and physical/medical complications, Tremors, Weakness Name of Substance 1: ETOH (wine) 1 - Age of First Use: Started drinking habitually in her 45's 1 - Amount (size/oz): At least a bottle of wine per day 1 - Frequency: Daily drinking.  Reports can go a day or so without drinking. 1 - Duration: Last two months steadily. 1 - Last Use / Amount: 02/03 around noon was last drink.  Drank 2 bottles in the preceeding 24 hours.                   Allergies:   Allergies  Allergen Reactions  . Codeine Itching  . Doxycycline Other (See Comments)    Reaction unknown  . Tetracycline Hcl Other (See Comments)    Reaction unknown    Vitals: Blood pressure 128/74, pulse 90, temperature 98.4 F (36.9 C), temperature source Oral, resp. rate 18, SpO2 100 %.  Risk to Self: Suicidal Ideation: No Suicidal Intent: No Is patient at risk for suicide?: No Suicidal Plan?: No Access to Means: No What has been your use of drugs/alcohol within the last 12 months?: ETOH use daily How many times?: 0 Other Self Harm Risks: SA issues Triggers for Past Attempts: None known Intentional Self Injurious Behavior: None Risk to Others: Homicidal Ideation: No Thoughts of Harm to Others: No Current Homicidal  Intent: No Current Homicidal Plan: No Access to Homicidal Means: No Identified Victim: No one History of harm to others?: No Assessment of Violence: None Noted Violent Behavior Description: Pt calm and cooperative Does patient have access to weapons?: Yes (Comment) (Pt has rifle & handguns.) Criminal Charges Pending?: No Does patient have a court date: No Prior Inpatient Therapy: Prior Inpatient Therapy: Yes Prior Therapy Dates: April 2015 Prior Therapy Facilty/Provider(s): [facility] Reason for Treatment: detox Prior Outpatient Therapy: Prior Outpatient Therapy: Yes Prior Therapy Dates: Summer of '15 Prior Therapy Facilty/Provider(s): outpt program in [place] at [facility]. Reason for Treatment: day/night program  Current Facility-Administered Medications  Medication Dose Route Frequency Provider Last Rate Last Dose  . alum & mag hydroxide-simeth (MAALOX/MYLANTA) 200-200-20 MG/5ML suspension 30 mL  30 mL Oral PRN Antonietta Breach, PA-C      . ibuprofen (ADVIL,MOTRIN) tablet 600 mg  600 mg Oral Q8H PRN Antonietta Breach, PA-C      .  LORazepam (ATIVAN) tablet 0-4 mg  0-4 mg Oral 4 times per day Antonietta Breach, PA-C   1 mg at 04/03/14 0943   Followed by  . [START ON 04/05/2014] LORazepam (ATIVAN) tablet 0-4 mg  0-4 mg Oral Q12H Antonietta Breach, PA-C      . nicotine (NICODERM CQ - dosed in mg/24 hours) patch 21 mg  21 mg Transdermal Daily Antonietta Breach, PA-C   21 mg at 04/03/14 0948  . ondansetron (ZOFRAN) tablet 4 mg  4 mg Oral Q8H PRN Antonietta Breach, PA-C      . zolpidem (AMBIEN) tablet 5 mg  5 mg Oral QHS PRN Antonietta Breach, PA-C       Current Outpatient Prescriptions  Medication Sig Dispense Refill  . ALPRAZolam (XANAX) 0.5 MG tablet Take 1 tablet (0.5 mg total) by mouth 2 (two) times daily as needed for anxiety. (Patient taking differently: Take 0.5 mg by mouth daily. ) 90 tablet 0  . aspirin 81 MG tablet Take 1 tablet (81 mg total) by mouth daily. 30 tablet   . calcium carbonate 200 MG capsule Take 200 mg by  mouth daily.     . cholecalciferol (VITAMIN D) 1000 UNITS tablet Take 1 tablet (1,000 Units total) by mouth daily.    [name] Kitchen glucosamine-chondroitin 500-400 MG tablet Take 1 tablet by mouth daily.    . norethindrone-ethinyl estradiol (FEMHRT 1/5) 1-5 MG-MCG TABS Take 1 tablet by mouth daily. 28 tablet   . pantoprazole (PROTONIX) 40 MG tablet Take 1 tablet (40 mg total) by mouth daily. 30 tablet 11  . SUMAtriptan (IMITREX) 100 MG tablet Take 100 mg by mouth every 2 (two) hours as needed for migraine or headache. May repeat in 2 hours if headache persists or recurs.    [name] Kitchen alendronate (FOSAMAX) 70 MG tablet Take 1 tablet (70 mg total) by mouth every 7 (seven) days. Take with a full glass of water on an empty stomach. (Patient not taking: Reported on 04/02/2014) 4 tablet 11  . diltiazem (CARDIZEM CD) 240 MG 24 hr capsule Take 1 capsule (240 mg total) by mouth daily. (Patient taking differently: Take 240 mg by mouth daily as needed (SVT). ) 30 capsule 11  . DULoxetine (CYMBALTA) 20 MG capsule Take 1 capsule (20 mg total) by mouth daily. (Patient not taking: Reported on 04/02/2014) 30 capsule 0  . DULoxetine (CYMBALTA) 30 MG capsule Take 1 capsule (30 mg total) by mouth daily. (Patient not taking: Reported on 04/02/2014) 30 capsule 3  . gabapentin (NEURONTIN) 100 MG capsule Take 2 capsules (200 mg total) by mouth 3 (three) times daily. (Patient not taking: Reported on 04/02/2014) 180 capsule 0  . metoCLOPramide (REGLAN) 10 MG tablet Take 1 tablet (10 mg total) by mouth 3 (three) times daily before meals. (Patient not taking: Reported on 04/02/2014) 90 tablet 0  . simvastatin (ZOCOR) 20 MG tablet Take 1 tablet (20 mg total) by mouth at bedtime. (Patient not taking: Reported on 04/02/2014) 30 tablet 11  . traZODone (DESYREL) 50 MG tablet Take one tablet as needed for insomnia. (Patient not taking: Reported on 04/02/2014) 30 tablet 0    Musculoskeletal: Strength & Muscle Tone: within normal limits Gait & Station:  normal Patient leans: N/A  Psychiatric Specialty Exam:     Blood pressure 128/74, pulse 90, temperature 98.4 F (36.9 C), temperature source Oral, resp. rate 18, SpO2 100 %.There is no weight on file to calculate BMI.  General Appearance: Casual  Eye Contact::  Good  Speech:  Clear  and Coherent and Normal Rate  Volume:  Normal  Mood:  Anxious and Depressed  Affect:  Congruent and Depressed  Thought Process:  Coherent, Goal Directed and Intact  Orientation:  Full (Time, Place, and Person)  Thought Content:  WDL  Suicidal Thoughts:  No  Homicidal Thoughts:  No  Memory:  Immediate;   Good Recent;   Good Remote;   Good  Judgement:  Fair  Insight:  Good  Psychomotor Activity:  Tremor  Concentration:  Good  Recall:  NA  Fund of Knowledge:Good  Language: Good  Akathisia:  NA  Handed:  Right  AIMS (if indicated):     Assets:  Desire for Improvement  ADL's:  Intact  Cognition: WNL  Sleep:      Medical Decision Making: Established Problem, Worsening (2), Review of Medication Regimen & Side Effects (2) and Review of New Medication or Change in Dosage (2)  Treatment Plan Summary: Daily contact with patient to assess and evaluate symptoms and progress in treatment, Medication management and Plan admit to [place]:  Recommend psychiatric Inpatient admission when medically cleared. For detox fron Alcohol Disposition: [place]   PMHNP-BC 04/03/2014 12:11 PM  Patient seen, evaluated and I agree with notes by Nurse Practitioner. Corena Pilgrim, MD

## 2014-04-03 NOTE — Progress Notes (Addendum)
Patient ID: Terri Ayala, female   DOB: Apr 26, 1955, 59 y.o.   MRN: [id number] D: Client visible on the unit, interacts appropriately with staff and peers. Client reports she was her in April, anxiety at "3" of 10, tremors noticeable(see CIWA). Client reports "it's not my job,I like what I do, it's the people on the job." Client reports she's HOH, both hears. A: Writer introduced self to client provided emotional support, administered medications as ordered. Staff will monitor q73min for safey. R: Client is safe on the unit, attended group.

## 2014-04-03 NOTE — Progress Notes (Addendum)
Patient vol admitted via [facility] for alcohol detox. Last here 05/2013. Patient reports drinking > 1 bottle of wine a day and etoh level in the ED was 346. She reports she drinks to alleviate work stress. Patient is anxious, visibly tremulous and unsteady on admit. CIWA is a "10" and pulse is elevated (patient does have hx SVT as well as MVP). BP slightly elevated as well. Patient denies pain. Patient is HOH. Oriented to unit, fluids provided and encouraged (refuses meal). Patient offered support and encouragement. Fall precautions reviewed as patient is a moderate fall risk. Ativan protocol initiated as liver enzymes are elevated. Patient has not expressed SI or HI at any time. She is safe at present. Jamie Kato
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
## 2014-04-03 NOTE — Tx Team (Signed)
Initial Interdisciplinary Treatment Plan   PATIENT STRESSORS: Occupational concerns Substance abuse   PATIENT STRENGTHS: Ability for insight Average or above average intelligence Capable of independent living Communication skills General fund of knowledge Motivation for treatment/growth Supportive family/friends Work skills   PROBLEM LIST: Problem List/Patient Goals Date to be addressed Date deferred Reason deferred Estimated date of resolution  "I want to get off of this alcohol and stop drinking so much."  04/03/14           "I want to get back to my job." 04/03/14                 ETOH detox 04/03/14                        DISCHARGE CRITERIA:  Ability to meet basic life and health needs Improved stabilization in mood, thinking, and/or behavior Need for constant or close observation no longer present Reduction of life-threatening or endangering symptoms to within safe limits Withdrawal symptoms are absent or subacute and managed without 24-hour nursing intervention  PRELIMINARY DISCHARGE PLAN: Attend 12-step recovery group Outpatient therapy Return to previous living arrangement Return to previous work or school arrangements  PATIENT/FAMIILY INVOLVEMENT: This treatment plan has been presented to and reviewed with the patient, Terri Ayala, and/or family member.  The patient and family have been given the opportunity to ask questions and make suggestions.  Jamie Kato 04/03/2014, 4:58 PM

## 2014-04-03 NOTE — Progress Notes (Signed)
[facility] Group Notes:  (Nursing/MHT/Case Management/Adjunct)  Date:  04/03/2014  Time:  2030 Type of Therapy:  wrap up group  Participation Level:  Active  Participation Quality:  Appropriate, Attentive, Sharing and Supportive  Affect:  Flat  Cognitive:  Alert and Appropriate  Insight:  Good  Engagement in Group:  Engaged  Modes of Intervention:  Clarification, Education and Support  Summary of Progress/Problems: Pt reports a stressful work relationship, a need to return to [employer], and refrain from isolating.  Pt also shares that she would enjoy guitar lessons and returning to church in order to be more social.   Jacques Navy 04/03/2014, 10:16 PM

## 2014-04-03 NOTE — BH Assessment (Signed)
[place] Assessment Progress Note  Per Corena Pilgrim, MD, this pt requires psychiatric hospitalization.  Letitia Libra, RN, [facility] has assigned pt to Rm [id number].  Pt has signed Voluntary Admission and Consent for Treatment, as well as Consent to Release Information, and these forms have been faxed to [facility].  Pt's nurse has been notified.  He agrees to send original documents along with pt via B[facility] Pries, and to call report to [phone].  Otila Kluver has requested that pt be transferred as soon as possible.  Jalene Mullet, MA Triage Specialist 04/03/2014 @ 12:15

## 2014-04-04 DIAGNOSIS — F10239 Alcohol dependence with withdrawal, unspecified: Secondary | ICD-10-CM

## 2014-04-04 NOTE — BHH Group Notes (Signed)
Adult Psychoeducational Group Note  Date:  04/04/2014 Time:  8:50 PM  Group Topic/Focus:  Wrap-Up Group:   The focus of this group is to help patients review their daily goal of treatment and discuss progress on daily workbooks.  Participation Level:  Minimal  Participation Quality:  Appropriate  Affect:  Flat  Cognitive:  Appropriate  Insight: Good  Engagement in Group:  Limited  Modes of Intervention:  Discussion  Additional Comments:  Eva stated her day was good.  She relaxed and played basketball.  She said she is discharging Monday to go back home.  Victorino Sparrow A 04/04/2014, 8:50 PM
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
## 2014-04-04 NOTE — Plan of Care (Signed)
Problem: Ineffective individual coping Goal: STG: Patient will remain free from self harm Outcome: Progressing Client is safe on the unit, denies SHI, takes medications as schedule, reports efficacy.

## 2014-04-04 NOTE — Progress Notes (Signed)
D: Pt denies SI/HI/AVH. Pt is pleasant and cooperative. Pt stated she was feeling better today, she started working on her D/C plans with the Education officer, museum.   A: Pt was offered support and encouragement. Pt was given scheduled medications. Pt was encourage to attend groups. Q 15 minute checks were done for safety.   R:Pt attends groups and interacts well with peers and staff. Pt is taking medication. Pt has no complaints at this time .Pt receptive to treatment and safety maintained on unit.

## 2014-04-04 NOTE — BHH Group Notes (Signed)
   [facility] LCSW Aftercare Discharge Planning Group Note  04/04/2014  8:45 AM   Participation Quality: Alert, Appropriate and Oriented  Mood/Affect: Appropriate, anxious  Depression Rating: 2-3  Anxiety Rating: 2  Thoughts of Suicide: Pt denies SI/HI  Will you contract for safety? Yes  Current AVH: Pt denies  Plan for Discharge/Comments: Pt attended discharge planning group and actively participated in group. CSW provided pt with today's workbook. Patient reports feeling "good" today and states that she was hospitalized for ETOH abuse. She declines residential services at this time and reports that she plans to discharge home and would like a referral for outpatient services. She also plans to attend AA meetings at discharge.  Transportation Means: Pt reports access to transportation  Supports: No supports mentioned at this time  Tilden Fossa, MSW, [place] Social Worker [employer] [phone]

## 2014-04-04 NOTE — H&P (Signed)
Psychiatric Admission Assessment Adult  Patient Identification: Terri Ayala MRN:  [id number] Date of Evaluation:  04/04/2014 Chief Complaint:  SUBSTANCE INDUCED MOOD DISORDER ETOH USE DISORDER SEVERE Principal Diagnosis: <principal problem not specified> Diagnosis:   Patient Active Problem List   Diagnosis Date Noted  . Alcohol use disorder, severe, dependence [F10.20] 04/03/2014  . Alcohol use disorder, moderate, dependence [F10.20]   . Alcohol use disorder, severe [F10.10] 06/18/2013  . MDD (major depressive disorder) [F32.2] 06/18/2013  . Lumbosacral radiculopathy at L5 [M54.17] 05/20/2013  . Protein-calorie malnutrition, severe [E43] 03/18/2013  . Nonspecific elevation of levels of transaminase or lactic acid dehydrogenase (LDH) [R74.0] 03/16/2013  . Hepatic steatosis [K76.0] 03/16/2013  . Chronic cholecystitis [K81.1] 03/14/2013  . Cervical arthritis [M46.92] 10/12/2012  . Palpitations [R00.2] 11/29/2010  . SVT (supraventricular tachycardia) [I47.1] 11/29/2010  . Mitral valve prolapse [I34.1] 11/29/2010  . Decreased libido [R68.82] 11/15/2010  . GOITER, MULTINODULAR [E04.2] 08/17/2009  . ANXIETY [F41.1] 08/17/2009  . MIGRAINE HEADACHE [G43.909] 08/17/2009  . HEARING LOSS [H91.90] 08/17/2009  . OSTEOPOROSIS [M81.0] 08/17/2009  . ASYMPTOMATIC POSTMENOPAUSAL STATUS [Z78.0] 08/17/2009  . DYSPHAGIA [R13.19] 06/19/2007   History of Present Illness:: 59 Y/O female with history of alcohol dependence who was with [place] in April last year. She states that after she was D/C she went to a CD IOP at [facility]. She states she had been able to abstain for a while. Seems she has had episodes of heavy drinking on and off. Things have gotten worst for the last few months when there were some changes at work. She explained that a peer was placed in a supervisory position and that she has not done a good job creating a very stressful work environment. States two weeks ago went out with a friend, drank  couple of glassess of wine. She continued to drink was drinking at least half a bottle of wine a day ( in the ED it was reported she had been drinking  one to two bottles of wine). States that when she went to a the  CD IOP she quit going to her counselor. States she still went to meetings. Has two temporary sponsors thinks she needs to find a permanent one. She lives by herself. She tends to isolate. Admits to depression " not as bad."  Elements:  Location:  alcohol dependence, underlying depression. Quality:  relapsed on use of alcohol, getting of control afffecting her functioning and worsening her depression. Severity:  severe. Timing:  every day. Duration:  increased use of alcoholfor last several months. Context:  alcohol depence with an acute relapse triggered by stress from work . the increased use of alcohol and the stress is affecting her depression. Associated Signs/Symptoms: Depression Symptoms:  depressed mood, insomnia, disturbed sleep, (Hypo) Manic Symptoms:  denies Anxiety Symptoms:  Excessive Worry, Psychotic Symptoms:  none PTSD Symptoms: Negative Total Time spent with patient: 45 minutes  Past Medical History:  Past Medical History  Diagnosis Date  . Low back pain   . Anxiety   . Migraines   . GOITER, MULTINODULAR 08/17/2009  . OSTEOPOROSIS 08/17/2009  . ASYMPTOMATIC POSTMENOPAUSAL STATUS 08/17/2009  . DYSPHAGIA 06/19/2007  . Supraventricular tachycardia   . Hyperlipidemia   . Mitral valve prolapse   . PONV (postoperative nausea and vomiting)     Past Surgical History  Procedure Laterality Date  . Laporoscopic abdominal surgery    . Cervix surgery    . Cholecystectomy N/A 03/14/2013    Procedure: LAPAROSCOPIC CHOLECYSTECTOMY WITH ATTEMPTED INTRAOPERATIVE  CHOLANGIOGRAM;  Surgeon: Harl Bowie, MD;  Location: [place];  Service: General;  Laterality: N/A;   Family History:  Family History  Problem Relation Age of Onset  . Thyroid disease Neg Hx   . Goiter  Neg Hx   . Colon cancer Father     Deceased, 56  . Osteoporosis Mother     Living, 29  . Healthy Brother   Henri Medal in father's side alcohol Social History:  History  Alcohol Use  . 2.4 oz/week  . 4 Glasses of wine per week     History  Drug Use No    History   Social History  . Marital Status: Single    Spouse Name: N/A    Number of Children: N/A  . Years of Education: N/A   Occupational History  . Biologist    Social History Main Topics  . Smoking status: Never Smoker   . Smokeless tobacco: Never Used  . Alcohol Use: 2.4 oz/week    4 Glasses of wine per week  . Drug Use: No  . Sexual Activity: No   Other Topics Concern  . None   Social History Narrative   Lives alone.  She has a BS biology.   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  [place] [employer] 9 years BS in Education officer, museum, some grad work Additional Social History:                          Musculoskeletal: Strength & Muscle Tone: within normal limits Gait & Station: normal Patient leans: N/A  Psychiatric Specialty Exam: Physical Exam  Review of Systems  Constitutional: Negative.   HENT: Negative.   Eyes: Negative.   Respiratory: Negative.   Cardiovascular: Positive for palpitations.  Genitourinary: Negative.   Musculoskeletal: Positive for joint pain.  Skin: Negative.   Neurological: Negative.   Endo/Heme/Allergies: Negative.   Psychiatric/Behavioral: Positive for substance abuse. The patient is nervous/anxious and has insomnia.     Blood pressure 98/63, pulse 129, temperature 98.4 F (36.9 C), temperature source Oral, resp. rate 20, height 5' 1.75" (1.568 m), weight 47.174 kg (104 lb).Body mass index is 19.19 kg/(m^2).  General Appearance: Fairly Groomed  Engineer, water::  Fair  Speech:  Clear and Coherent and not spontaneous  Volume:  Decreased  Mood:  Depressed  Affect:  Restricted  Thought Process:  Coherent and Goal Directed  Orientation:   Full (Time, Place, and Person)  Thought Content:  events symptoms worries concerns (she minimizes, rationalizes)   Suicidal Thoughts:  No  Homicidal Thoughts:  No  Memory:  Immediate;   Fair Recent;   Fair Remote;   Fair  Judgement:  Fair  Insight:  Present and Shallow  Psychomotor Activity:  Restlessness  Concentration:  Fair  Recall:  [employer] of Knowledge:Fair  Language: Fair  Akathisia:  No  Handed:  Right  AIMS (if indicated):     Assets:  Desire for Improvement Housing Vocational/Educational  ADL's:  Intact  Cognition: WNL  Sleep:  Number of Hours: 6.5   Risk to Self: Is patient at risk for suicide?: No Risk to Others:   Prior Inpatient Therapy:  [facility] Prior Outpatient Therapy:  went to [facility] CD IOP after she left here last time  Alcohol Screening: 1. How often do you have a drink containing alcohol?: 4 or more times a week 2. How many drinks containing alcohol do you have on  a typical day when you are drinking?: 3 or 4 3. How often do you have six or more drinks on one occasion?: Monthly Preliminary Score: 3 4. How often during the last year have you found that you were not able to stop drinking once you had started?: Monthly 5. How often during the last year have you failed to do what was normally expected from you becasue of drinking?: Less than monthly 6. How often during the last year have you needed a first drink in the morning to get yourself going after a heavy drinking session?: Less than monthly 7. How often during the last year have you had a feeling of guilt of remorse after drinking?: Monthly 8. How often during the last year have you been unable to remember what happened the night before because you had been drinking?: Less than monthly 9. Have you or someone else been injured as a result of your drinking?: No 10. Has a relative or friend or a doctor or another health worker been concerned about your drinking or suggested you cut down?: Yes, during the  last year Alcohol Use Disorder Identification Test Final Score (AUDIT): 18 Brief Intervention: Yes  Allergies:   Allergies  Allergen Reactions  . Codeine Itching  . Doxycycline Other (See Comments)    Reaction unknown  . Tetracycline Hcl Other (See Comments)    Reaction unknown   Lab Results:  Results for orders placed or performed during the hospital encounter of 04/02/14 (from the past 48 hour(s))  CBC WITH DIFFERENTIAL     Status: Abnormal   Collection Time: 04/02/14  9:12 PM  Result Value Ref Range   WBC 5.4 4.0 - 10.5 K/uL   RBC 4.20 3.87 - 5.11 MIL/uL   Hemoglobin 14.5 12.0 - 15.0 g/dL   HCT 42.1 36.0 - 46.0 %   MCV 100.2 (H) 78.0 - 100.0 fL   MCH 34.5 (H) 26.0 - 34.0 pg   MCHC 34.4 30.0 - 36.0 g/dL   RDW 13.0 11.5 - 15.5 %   Platelets 110 (L) 150 - 400 K/uL    Comment: SPECIMEN CHECKED FOR CLOTS REPEATED TO VERIFY PLATELET COUNT CONFIRMED BY SMEAR    Neutrophils Relative % 69 43 - 77 %   Lymphocytes Relative 24 12 - 46 %   Monocytes Relative 6 3 - 12 %   Eosinophils Relative 0 0 - 5 %   Basophils Relative 1 0 - 1 %   Neutro Abs 3.7 1.7 - 7.7 K/uL   Lymphs Abs 1.3 0.7 - 4.0 K/uL   Monocytes Absolute 0.3 0.1 - 1.0 K/uL   Eosinophils Absolute 0.0 0.0 - 0.7 K/uL   Basophils Absolute 0.1 0.0 - 0.1 K/uL  Comprehensive metabolic panel     Status: Abnormal   Collection Time: 04/02/14  9:12 PM  Result Value Ref Range   Sodium 139 135 - 145 mmol/L   Potassium 4.2 3.5 - 5.1 mmol/L   Chloride 101 96 - 112 mmol/L   CO2 21 19 - 32 mmol/L   Glucose, Bld 81 70 - 99 mg/dL   BUN 10 6 - 23 mg/dL   Creatinine, Ser 0.60 0.50 - 1.10 mg/dL   Calcium 8.9 8.4 - 10.5 mg/dL   Total Protein 8.3 6.0 - 8.3 g/dL   Albumin 4.7 3.5 - 5.2 g/dL   AST 295 (H) 0 - 37 U/L   ALT 51 (H) 0 - 35 U/L   Alkaline Phosphatase 114 39 - 117 U/L  Total Bilirubin 0.7 0.3 - 1.2 mg/dL   GFR calc non Af Amer >90 >90 mL/min   GFR calc Af Amer >90 >90 mL/min    Comment: (NOTE) The eGFR has been  calculated using the CKD EPI equation. This calculation has not been validated in all clinical situations. eGFR's persistently <90 mL/min signify possible Chronic Kidney Disease.    Anion gap 17 (H) 5 - 15  Ethanol     Status: Abnormal   Collection Time: 04/02/14  9:12 PM  Result Value Ref Range   Alcohol, Ethyl (B) 346 (H) 0 - 9 mg/dL    Comment:        LOWEST DETECTABLE LIMIT FOR SERUM ALCOHOL IS 11 mg/dL FOR MEDICAL PURPOSES ONLY   Drug screen panel, emergency     Status: Abnormal   Collection Time: 04/02/14  9:35 PM  Result Value Ref Range   Opiates NONE DETECTED NONE DETECTED   Cocaine NONE DETECTED NONE DETECTED   Benzodiazepines POSITIVE (A) NONE DETECTED   Amphetamines NONE DETECTED NONE DETECTED   Tetrahydrocannabinol NONE DETECTED NONE DETECTED   Barbiturates NONE DETECTED NONE DETECTED    Comment:        DRUG SCREEN FOR MEDICAL PURPOSES ONLY.  IF CONFIRMATION IS NEEDED FOR ANY PURPOSE, NOTIFY LAB WITHIN 5 DAYS.        LOWEST DETECTABLE LIMITS FOR URINE DRUG SCREEN Drug Class       Cutoff (ng/mL) Amphetamine      1000 Barbiturate      200 Benzodiazepine   491 Tricyclics       791 Opiates          300 Cocaine          300 THC              50    Current Medications: Current Facility-Administered Medications  Medication Dose Route Frequency Provider Last Rate Last Dose  . alum & mag hydroxide-simeth (MAALOX/MYLANTA) 200-200-20 MG/5ML suspension 30 mL  30 mL Oral PRN Delfin Gant, NP      . hydrOXYzine (ATARAX/VISTARIL) tablet 25 mg  25 mg Oral Q6H PRN Nicholaus Bloom, MD      . ibuprofen (ADVIL,MOTRIN) tablet 600 mg  600 mg Oral Q8H PRN Delfin Gant, NP      . loperamide (IMODIUM) capsule 2-4 mg  2-4 mg Oral PRN Nicholaus Bloom, MD      . LORazepam (ATIVAN) tablet 1 mg  1 mg Oral Q6H PRN Nicholaus Bloom, MD   1 mg at 04/03/14 1703  . LORazepam (ATIVAN) tablet 1 mg  1 mg Oral QID Nicholaus Bloom, MD   1 mg at 04/04/14 0753   Followed by  . LORazepam  (ATIVAN) tablet 1 mg  1 mg Oral TID Nicholaus Bloom, MD       Followed by  . [START ON 04/05/2014] LORazepam (ATIVAN) tablet 1 mg  1 mg Oral BID Nicholaus Bloom, MD       Followed by  . [START ON 04/07/2014] LORazepam (ATIVAN) tablet 1 mg  1 mg Oral Daily Nicholaus Bloom, MD      . multivitamin with minerals tablet 1 tablet  1 tablet Oral Daily Nicholaus Bloom, MD   1 tablet at 04/04/14 [phone]  . ondansetron (ZOFRAN-ODT) disintegrating tablet 4 mg  4 mg Oral Q6H PRN Nicholaus Bloom, MD      . pneumococcal 23 valent vaccine (PNU-IMMUNE) injection 0.5 mL  0.5  mL Intramuscular Tomorrow-1000 Nicholaus Bloom, MD      . thiamine (VITAMIN B-1) tablet 100 mg  100 mg Oral Daily Nicholaus Bloom, MD   100 mg at 04/04/14 0753  . zolpidem (AMBIEN) tablet 5 mg  5 mg Oral QHS PRN Delfin Gant, NP       PTA Medications: Prescriptions prior to admission  Medication Sig Dispense Refill Last Dose  . alendronate (FOSAMAX) 70 MG tablet Take 1 tablet (70 mg total) by mouth every 7 (seven) days. Take with a full glass of water on an empty stomach. (Patient not taking: Reported on 04/02/2014) 4 tablet 11 Not Taking at Unknown time  . aspirin 81 MG tablet Take 1 tablet (81 mg total) by mouth daily. 30 tablet  04/02/2014 at Unknown time  . calcium carbonate 200 MG capsule Take 200 mg by mouth daily.    04/02/2014 at Unknown time  . cholecalciferol (VITAMIN D) 1000 UNITS tablet Take 1 tablet (1,000 Units total) by mouth daily.   04/02/2014 at Unknown time  . DULoxetine (CYMBALTA) 20 MG capsule Take 1 capsule (20 mg total) by mouth daily. (Patient not taking: Reported on 04/02/2014) 30 capsule 0 Not Taking at Unknown time  . gabapentin (NEURONTIN) 100 MG capsule Take 2 capsules (200 mg total) by mouth 3 (three) times daily. (Patient not taking: Reported on 04/02/2014) 180 capsule 0 Not Taking at Unknown time  . glucosamine-chondroitin 500-400 MG tablet Take 1 tablet by mouth daily.   04/02/2014 at Unknown time  . metoCLOPramide (REGLAN) 10 MG tablet  Take 1 tablet (10 mg total) by mouth 3 (three) times daily before meals. (Patient not taking: Reported on 04/02/2014) 90 tablet 0 Not Taking at Unknown time  . norethindrone-ethinyl estradiol (FEMHRT 1/5) 1-5 MG-MCG TABS Take 1 tablet by mouth daily. 28 tablet  04/02/2014 at Unknown time  . pantoprazole (PROTONIX) 40 MG tablet Take 1 tablet (40 mg total) by mouth daily. 30 tablet 11 04/02/2014 at Unknown time  . simvastatin (ZOCOR) 20 MG tablet Take 1 tablet (20 mg total) by mouth at bedtime. (Patient not taking: Reported on 04/02/2014) 30 tablet 11 Not Taking at Unknown time  . SUMAtriptan (IMITREX) 100 MG tablet Take 100 mg by mouth every 2 (two) hours as needed for migraine or headache. May repeat in 2 hours if headache persists or recurs.   Past Week at Unknown time  . traZODone (DESYREL) 50 MG tablet Take one tablet as needed for insomnia. (Patient not taking: Reported on 04/02/2014) 30 tablet 0 Not Taking at Unknown time    Previous Psychotropic Medications: Yes   Substance Abuse History in the last 12 months:  Yes.      Consequences of Substance Abuse: Withdrawal Symptoms:   Tremors  Results for orders placed or performed during the hospital encounter of 04/02/14 (from the past 72 hour(s))  CBC WITH DIFFERENTIAL     Status: Abnormal   Collection Time: 04/02/14  9:12 PM  Result Value Ref Range   WBC 5.4 4.0 - 10.5 K/uL   RBC 4.20 3.87 - 5.11 MIL/uL   Hemoglobin 14.5 12.0 - 15.0 g/dL   HCT 42.1 36.0 - 46.0 %   MCV 100.2 (H) 78.0 - 100.0 fL   MCH 34.5 (H) 26.0 - 34.0 pg   MCHC 34.4 30.0 - 36.0 g/dL   RDW 13.0 11.5 - 15.5 %   Platelets 110 (L) 150 - 400 K/uL    Comment: SPECIMEN CHECKED FOR CLOTS REPEATED TO VERIFY  PLATELET COUNT CONFIRMED BY SMEAR    Neutrophils Relative % 69 43 - 77 %   Lymphocytes Relative 24 12 - 46 %   Monocytes Relative 6 3 - 12 %   Eosinophils Relative 0 0 - 5 %   Basophils Relative 1 0 - 1 %   Neutro Abs 3.7 1.7 - 7.7 K/uL   Lymphs Abs 1.3 0.7 - 4.0 K/uL    Monocytes Absolute 0.3 0.1 - 1.0 K/uL   Eosinophils Absolute 0.0 0.0 - 0.7 K/uL   Basophils Absolute 0.1 0.0 - 0.1 K/uL  Comprehensive metabolic panel     Status: Abnormal   Collection Time: 04/02/14  9:12 PM  Result Value Ref Range   Sodium 139 135 - 145 mmol/L   Potassium 4.2 3.5 - 5.1 mmol/L   Chloride 101 96 - 112 mmol/L   CO2 21 19 - 32 mmol/L   Glucose, Bld 81 70 - 99 mg/dL   BUN 10 6 - 23 mg/dL   Creatinine, Ser 0.60 0.50 - 1.10 mg/dL   Calcium 8.9 8.4 - 10.5 mg/dL   Total Protein 8.3 6.0 - 8.3 g/dL   Albumin 4.7 3.5 - 5.2 g/dL   AST 295 (H) 0 - 37 U/L   ALT 51 (H) 0 - 35 U/L   Alkaline Phosphatase 114 39 - 117 U/L   Total Bilirubin 0.7 0.3 - 1.2 mg/dL   GFR calc non Af Amer >90 >90 mL/min   GFR calc Af Amer >90 >90 mL/min    Comment: (NOTE) The eGFR has been calculated using the CKD EPI equation. This calculation has not been validated in all clinical situations. eGFR's persistently <90 mL/min signify possible Chronic Kidney Disease.    Anion gap 17 (H) 5 - 15  Ethanol     Status: Abnormal   Collection Time: 04/02/14  9:12 PM  Result Value Ref Range   Alcohol, Ethyl (B) 346 (H) 0 - 9 mg/dL    Comment:        LOWEST DETECTABLE LIMIT FOR SERUM ALCOHOL IS 11 mg/dL FOR MEDICAL PURPOSES ONLY   Drug screen panel, emergency     Status: Abnormal   Collection Time: 04/02/14  9:35 PM  Result Value Ref Range   Opiates NONE DETECTED NONE DETECTED   Cocaine NONE DETECTED NONE DETECTED   Benzodiazepines POSITIVE (A) NONE DETECTED   Amphetamines NONE DETECTED NONE DETECTED   Tetrahydrocannabinol NONE DETECTED NONE DETECTED   Barbiturates NONE DETECTED NONE DETECTED    Comment:        DRUG SCREEN FOR MEDICAL PURPOSES ONLY.  IF CONFIRMATION IS NEEDED FOR ANY PURPOSE, NOTIFY LAB WITHIN 5 DAYS.        LOWEST DETECTABLE LIMITS FOR URINE DRUG SCREEN Drug Class       Cutoff (ng/mL) Amphetamine      1000 Barbiturate      200 Benzodiazepine   614 Tricyclics        431 Opiates          300 Cocaine          300 THC              50     Observation Level/Precautions:  15 minute checks  Laboratory:  As per the ED   Psychotherapy:  Individual/group  Medications:  Ativan detox protocol, continue the Cymbalta  Consultations:    Discharge Concerns:  Need for a residential treatment program  Estimated LOS: 3-5 days  Other:  Psychological Evaluations: No   Treatment Plan Summary: Daily contact with patient to assess and evaluate symptoms and progress in treatment and Medication management Supportive approach/coping skills/relapse prevention Alcohol Dependence/withdrawal: Ativan detox protocol ( she is evidencing tremors, increased BP, increased pulse, restlessness) Seems to be under reporting her alcohol intake Consider Campral for cravings Reassess and address the co morbidities. continue the Cymbalta, optimize response Medical Decision Making:  Review of Psycho-Social Stressors (1), Review or order clinical lab tests (1), Review of Medication Regimen & Side Effects (2) and Review of New Medication or Change in Dosage (2) Labs show increased liver enzymes, increased MCV, BAL 346.  I certify that inpatient services furnished can reasonably be expected to improve the patient's condition.   Lakitha Gordy A 2/5/201610:16 AM

## 2014-04-04 NOTE — [facility] Group Notes (Signed)
[place] LCSW Group Therapy 04/04/2014 1:15 PM Type of Therapy: Group Therapy Participation Level: Active  Participation Quality: Attentive, Sharing and Supportive  Affect: Appropriate  Cognitive: Alert and Oriented  Insight: Developing/Improving and Engaged  Engagement in Therapy: Developing/Improving and Engaged  Modes of Intervention: Clarification, Confrontation, Discussion, Education, Exploration, Limit-setting, Orientation, Problem-solving, Rapport Building, Art therapist, Socialization and Support  Summary of Progress/Problems: The topic for today was feelings about relapse. Pt discussed what relapse prevention is to them and identified triggers that they are on the path to relapse. Pt processed their feeling towards relapse and was able to relate to peers. Pt discussed coping skills that can be used for relapse prevention. Patient identified isolation as her relapse behavior. Patient was able to identify healthy coping skills such as attending AA support groups and engaging in outpatient services. CSW and group discussed importance of support in recovery and well-being. CSW and other group members provided emotional support and encouragement.   Tilden Fossa, MSW, [place] Worker [facility] [phone]
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
## 2014-04-04 NOTE — Tx Team (Signed)
Interdisciplinary Treatment Plan Update (Adult) Date: 04/04/2014   Time Reviewed: 9:30 AM  Progress in Treatment: Attending groups: Continuing to assess, patient new to milieu Participating in groups: Continuing to assess, patient new to milieu Taking medication as prescribed: Yes Tolerating medication: Yes Family/Significant other contact made: No, CSW assessing for appropriate contacts Patient understands diagnosis: Yes Discussing patient identified problems/goals with staff: Yes Medical problems stabilized or resolved: Yes Denies suicidal/homicidal ideation: Yes Issues/concerns per patient self-inventory: Yes Other:  New problem(s) identified: N/A  Discharge Plan or Barriers:   2/5: Patient plans to return home at discharge and would like a referral to outpatient services.  Reason for Continuation of Hospitalization:  Depression Anxiety Medication Stabilization   Comments: N/A  Estimated length of stay: 2-3 days  For review of initial/current patient goals, please see plan of care.  Patient is a 59 year old female admitted for alcohol detox and depression. Patient lives in [place]. Patient will benefit from crisis stabilization, medication evaluation, group therapy, and psycho education in addition to case management for discharge planning. Patient and CSW reviewed pt's identified goals and treatment plan. Pt verbalized understanding and agreed to treatment plan.   Attendees: Patient:    Family:    Physician: Dr. Parke Poisson; Dr. Sabra Heck 04/04/2014 9:30 AM  Nursing: Charlyne Quale; Corky Mull; Lawernce Pitts, RN 04/04/2014 9:30 AM  Clinical Social Worker: Erasmo Downer Kenetra Hildenbrand,  [place] 04/04/2014 9:30 AM  Other: Joette Catching, LCSW  04/04/2014 9:30 AM  Other: Lucinda Dell, [place] Sessions Liaison 04/04/2014 9:30 AM  Other: Lars Pinks, Case Manager 04/04/2014 9:30 AM  Other: Agustina Caroli, NP 04/04/2014 9:30 AM  Other: Maxie Better, LCSWA 04/04/2014 9:30 AM  Other: Edwyna Shell, LCSW  04/04/2014 9:30 AM  Other:      Scribe for Treatment Team:  Tilden Fossa, MSW, [employer] [phone]

## 2014-04-05 ENCOUNTER — Encounter (HOSPITAL_COMMUNITY): Payer: Self-pay | Admitting: Registered Nurse

## 2014-04-05 DIAGNOSIS — F1020 Alcohol dependence, uncomplicated: Principal | ICD-10-CM

## 2014-04-05 NOTE — BHH Group Notes (Signed)
[facility] Group Notes:  (Nursing/MHT/Case Management/Adjunct)  Date:  04/05/2014  Time:  11:48 AM  Type of Therapy:  Psychoeducational Skills  Participation Level:  Active  Participation Quality:  Appropriate  Affect:  Appropriate  Cognitive:  Appropriate  Insight:  Appropriate  Engagement in Group:  Engaged  Modes of Intervention:  Discussion  Summary of Progress/Problems: Pt did attend self inventory group, pt reported that she was negative SI/HI, no AH/VH noted. Pt rated her depression as a 2, and her helplessness/hopelessness as a 0.     Pt reported no issues or concerns.    Terri Ayala 04/05/2014, 11:48 AM

## 2014-04-05 NOTE — Progress Notes (Signed)
Patient has been up and active on the unit, attended group this evening and has voiced no complaints. Patient currently denies having pain, -si/hi/a/v hall. Support and encouragement offered, safety maintained on unit, will continue to monitor.  

## 2014-04-05 NOTE — Plan of Care (Signed)
Problem: Alteration in mood & ability to function due to Goal: STG-Patient will report withdrawal symptoms Outcome: Progressing Patient voiced no complaints of withdrawal symptoms on 04/05/14.

## 2014-04-05 NOTE — BHH Group Notes (Signed)
[facility] Group Notes:  (Clinical Social Work)  04/05/2014     1:30-1:45PM  Summary of Progress/Problems:   The main focus of today's process group was to learn how to use a decisional balance exercise to move forward in the Stages of Change.  Motivational Interviewing was utilized to help patients explore in depth the perceived benefits and costs of a self-sabotaging behavior, as well as the  benefits and costs of replacing that with a healthy coping mechanism.   The patient expressed that she is in the hospital for alcohol withdrawal and depression.  She stated her goal for this hospitalization is to become sober, then she wants to remain sober.  Her motivation is 8 out of 10 to pursue sobriety.  She stated that the obstacle for her at this time is her job, stating she needs to find a different job.  Type of Therapy:  Group Therapy - Process   Participation Level:  Active  Participation Quality:  Attentive and Sharing  Affect:  Depressed and Flat  Cognitive:  Appropriate  Insight:  Engaged  Engagement in Therapy:  Engaged  Modes of Intervention:  Education, Motivational Interviewing  Selmer Dominion, LCSW 04/05/2014, 3:15 PM

## 2014-04-05 NOTE — Progress Notes (Signed)
Adult Psychoeducational Group Note  Date:  04/05/2014 Time:  9:10 PM  Group Topic/Focus:  Wrap-Up Group:   The focus of this group is to help patients review their daily goal of treatment and discuss progress on daily workbooks.  Participation Level:  Active  Participation Quality:  Appropriate  Affect:  Appropriate  Cognitive:  Appropriate  Insight: Appropriate  Engagement in Group:  Engaged  Modes of Intervention:  Discussion  Additional Comments: The patient expressed in group about discharge plans.The patient also said that she is getting help with discharge from staff.  Nash Shearer 04/05/2014, 9:10 PM

## 2014-04-05 NOTE — Plan of Care (Signed)
Problem: Alteration in mood & ability to function due to Goal: STG-Patient will attend groups Outcome: Progressing Patient attended group this evening and participated.

## 2014-04-05 NOTE — Progress Notes (Signed)
[facility] MD Progress Note  04/05/2014 12:56 PM Terri Ayala  MRN:  [id number] Subjective:  **Patient states that she started drinking again about 2-3 weeks ago and came in to get help with detox and that is was needed to help with her work.    States that she is feeling good today except for diarrhea.  Patient denies suicidal/homicidal ideation, psychosis, and paranoia.  Patient states depression 2/10 and anxiety 3/10.  Tolerating medications without adverse effects and participating in group sessions.  Patient states that she would also like to talk to someone to assist with getting some outpatient services set up    Principal Problem: <principal problem not specified> Diagnosis:   Patient Active Problem List   Diagnosis Date Noted  . Alcohol use disorder, severe, dependence [F10.20] 04/03/2014  . Alcohol use disorder, moderate, dependence [F10.20]   . Alcohol use disorder, severe [F10.10] 06/18/2013  . MDD (major depressive disorder) [F32.2] 06/18/2013  . Lumbosacral radiculopathy at L5 [M54.17] 05/20/2013  . Protein-calorie malnutrition, severe [E43] 03/18/2013  . Nonspecific elevation of levels of transaminase or lactic acid dehydrogenase (LDH) [R74.0] 03/16/2013  . Hepatic steatosis [K76.0] 03/16/2013  . Chronic cholecystitis [K81.1] 03/14/2013  . Cervical arthritis [M46.92] 10/12/2012  . Palpitations [R00.2] 11/29/2010  . SVT (supraventricular tachycardia) [I47.1] 11/29/2010  . Mitral valve prolapse [I34.1] 11/29/2010  . Decreased libido [R68.82] 11/15/2010  . GOITER, MULTINODULAR [E04.2] 08/17/2009  . ANXIETY [F41.1] 08/17/2009  . MIGRAINE HEADACHE [G43.909] 08/17/2009  . HEARING LOSS [H91.90] 08/17/2009  . OSTEOPOROSIS [M81.0] 08/17/2009  . ASYMPTOMATIC POSTMENOPAUSAL STATUS [Z78.0] 08/17/2009  . DYSPHAGIA [R13.19] 06/19/2007   Total Time spent with patient: 30 minutes   Past Medical History:  Past Medical History  Diagnosis Date  . Low back pain   . Anxiety   . Migraines    . GOITER, MULTINODULAR 08/17/2009  . OSTEOPOROSIS 08/17/2009  . ASYMPTOMATIC POSTMENOPAUSAL STATUS 08/17/2009  . DYSPHAGIA 06/19/2007  . Supraventricular tachycardia   . Hyperlipidemia   . Mitral valve prolapse   . PONV (postoperative nausea and vomiting)     Past Surgical History  Procedure Laterality Date  . Laporoscopic abdominal surgery    . Cervix surgery    . Cholecystectomy N/A 03/14/2013    Procedure: LAPAROSCOPIC CHOLECYSTECTOMY WITH ATTEMPTED INTRAOPERATIVE CHOLANGIOGRAM;  Surgeon: Harl Bowie, MD;  Location: [facility];  Service: General;  Laterality: N/A;   Family History:  Family History  Problem Relation Age of Onset  . Thyroid disease Neg Hx   . Goiter Neg Hx   . Colon cancer Father     Deceased, 36  . Osteoporosis Mother     Living, 69  . Healthy Brother    Social History:  History  Alcohol Use  . 2.4 oz/week  . 4 Glasses of wine per week     History  Drug Use No    History   Social History  . Marital Status: Single    Spouse Name: N/A    Number of Children: N/A  . Years of Education: N/A   Occupational History  . Biologist    Social History Main Topics  . Smoking status: Never Smoker   . Smokeless tobacco: Never Used  . Alcohol Use: 2.4 oz/week    4 Glasses of wine per week  . Drug Use: No  . Sexual Activity: No   Other Topics Concern  . None   Social History Narrative   Lives alone.  She has a BS biology.   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   Additional History:    Sleep: Improving  Appetite:  Improving   Assessment:   Musculoskeletal: Strength & Muscle Tone: within normal limits Gait & Station: normal Patient leans: N/A   Psychiatric Specialty Exam: Physical Exam  ROS  Blood pressure 106/73, pulse 123, temperature 98 F (36.7 C), temperature source Oral, resp. rate 16, height 5' 1.75" (1.568 m), weight 47.174 kg (104 lb).Body mass index is 19.19 kg/(m^2).  General Appearance: Casual   Eye Contact::  Good  Speech:  Clear and Coherent  Volume:  Decreased  Mood:  Depressed  Affect:  Restricted  Thought Process:  Coherent and Goal Directed  Orientation:  Full (Time, Place, and Person)  Thought Content:  "I'm feeling better actually"  Suicidal Thoughts:  No  Homicidal Thoughts:  No  Memory:  Immediate;   Good Recent;   Good Remote;   Good  Judgement:  Fair  Insight:  Present and Shallow  Psychomotor Activity:  Normal  Concentration:  Fair  Recall:  Good  Fund of Knowledge:Fair  Language: Good  Akathisia:  No  Handed:  Right  AIMS (if indicated):     Assets:  Desire for Improvement Housing Vocational/Educational  ADL's:  Intact  Cognition: WNL  Sleep:  Number of Hours: 6.25     Current Medications: Current Facility-Administered Medications  Medication Dose Route Frequency Provider Last Rate Last Dose  . alum & mag hydroxide-simeth (MAALOX/MYLANTA) 200-200-20 MG/5ML suspension 30 mL  30 mL Oral PRN Delfin Gant, NP      . hydrOXYzine (ATARAX/VISTARIL) tablet 25 mg  25 mg Oral Q6H PRN Nicholaus Bloom, MD      . ibuprofen (ADVIL,MOTRIN) tablet 600 mg  600 mg Oral Q8H PRN Delfin Gant, NP      . loperamide (IMODIUM) capsule 2-4 mg  2-4 mg Oral PRN Nicholaus Bloom, MD      . LORazepam (ATIVAN) tablet 1 mg  1 mg Oral Q6H PRN Nicholaus Bloom, MD   1 mg at 04/03/14 1703  . LORazepam (ATIVAN) tablet 1 mg  1 mg Oral BID Nicholaus Bloom, MD       Followed by  . [START ON 04/07/2014] LORazepam (ATIVAN) tablet 1 mg  1 mg Oral Daily Nicholaus Bloom, MD      . multivitamin with minerals tablet 1 tablet  1 tablet Oral Daily Nicholaus Bloom, MD   1 tablet at 04/05/14 1050  . ondansetron (ZOFRAN-ODT) disintegrating tablet 4 mg  4 mg Oral Q6H PRN Nicholaus Bloom, MD      . thiamine (VITAMIN B-1) tablet 100 mg  100 mg Oral Daily Nicholaus Bloom, MD   100 mg at 04/05/14 1050  . zolpidem (AMBIEN) tablet 5 mg  5 mg Oral QHS PRN Delfin Gant, NP        Lab Results: No results  found for this or any previous visit (from the past 44 hour(s)).  Physical Findings: AIMS: Facial and Oral Movements Muscles of Facial Expression: None, normal Lips and Perioral Area: None, normal Jaw: None, normal Tongue: None, normal,Extremity Movements Upper (arms, wrists, hands, fingers): None, normal Lower (legs, knees, ankles, toes): None, normal, Trunk Movements Neck, shoulders, hips: None, normal, Overall Severity Severity of abnormal movements (highest score from questions above): None, normal Incapacitation due to abnormal movements: None, normal Patient's awareness of abnormal movements (rate only patient's report): No Awareness, Dental Status Current problems with teeth  and/or dentures?: No Does patient usually wear dentures?: No  CIWA:  CIWA-Ar Total: 0 COWS:     Treatment Plan Summary: Daily contact with patient to assess and evaluate symptoms and progress in treatment, Medication management and Plan Will continue current treatment plan without any changes at this time   Medical Decision Making:  Established Problem, Stable/Improving (1), Review of Psycho-Social Stressors (1), Review of Last Therapy Session (1) and Review of Medication Regimen & Side Effects (2)  Ayala, Shuvon, FNP-BC 04/05/2014, 12:56 PM I agreed with findings and treatment plan of this patient

## 2014-04-05 NOTE — Progress Notes (Signed)
Patient ID: Terri Ayala, female   DOB: 13-Dec-1955, 59 y.o.   MRN: [id number]   D: Pt has been appropriate on the unit today, she has attended all groups and engaged in treatment. Pt reported in group that her depression was a 2, and her hopelessness was a 0. Pt reported being negative SI/HI, no AH/VH noted. Pt reported that her goal was to work hard to go home, to go to group and take meds as prescribed by the doctor. A: 15 min checks continued for patient safety. R: Pt safety maintained.

## 2014-04-06 ENCOUNTER — Encounter (HOSPITAL_COMMUNITY): Payer: Self-pay | Admitting: Registered Nurse

## 2014-04-06 NOTE — Progress Notes (Signed)
Patient ID: Terri Ayala, female   DOB: 11-29-1955, 59 y.o.   MRN: [id number] [facility] MD Progress Note  04/06/2014 2:58 PM Terri Ayala  MRN:  [id number] Subjective:  Patient states "I'm doing good."  Rates depression 2/10 and anxiety 3/10.  Patient states I wanted to go home today but I'll wait on the social workes and doctors to make sure everything is set up.  But I feel a lot better and I'm doing good."  Patient continues to       Deny suicidal/homicidal ideation, psychosis, and paranoia. Continues to tolerate medications without adverse effects and participate in group sessions.   Principal Problem: Alcohol use disorder, severe, dependence Diagnosis:   Patient Active Problem List   Diagnosis Date Noted  . Alcohol use disorder, severe, dependence [F10.20] 04/03/2014  . Alcohol use disorder, moderate, dependence [F10.20]   . Alcohol use disorder, severe [F10.10] 06/18/2013  . MDD (major depressive disorder) [F32.2] 06/18/2013  . Lumbosacral radiculopathy at L5 [M54.17] 05/20/2013  . Protein-calorie malnutrition, severe [E43] 03/18/2013  . Nonspecific elevation of levels of transaminase or lactic acid dehydrogenase (LDH) [R74.0] 03/16/2013  . Hepatic steatosis [K76.0] 03/16/2013  . Chronic cholecystitis [K81.1] 03/14/2013  . Cervical arthritis [M46.92] 10/12/2012  . Palpitations [R00.2] 11/29/2010  . SVT (supraventricular tachycardia) [I47.1] 11/29/2010  . Mitral valve prolapse [I34.1] 11/29/2010  . Decreased libido [R68.82] 11/15/2010  . GOITER, MULTINODULAR [E04.2] 08/17/2009  . ANXIETY [F41.1] 08/17/2009  . MIGRAINE HEADACHE [G43.909] 08/17/2009  . HEARING LOSS [H91.90] 08/17/2009  . OSTEOPOROSIS [M81.0] 08/17/2009  . ASYMPTOMATIC POSTMENOPAUSAL STATUS [Z78.0] 08/17/2009  . DYSPHAGIA [R13.19] 06/19/2007   Total Time spent with patient: 20 minutes   Past Medical History:  Past Medical History  Diagnosis Date  . Low back pain   . Anxiety   . Migraines   . GOITER, MULTINODULAR  08/17/2009  . OSTEOPOROSIS 08/17/2009  . ASYMPTOMATIC POSTMENOPAUSAL STATUS 08/17/2009  . DYSPHAGIA 06/19/2007  . Supraventricular tachycardia   . Hyperlipidemia   . Mitral valve prolapse   . PONV (postoperative nausea and vomiting)     Past Surgical History  Procedure Laterality Date  . Laporoscopic abdominal surgery    . Cervix surgery    . Cholecystectomy N/A 03/14/2013    Procedure: LAPAROSCOPIC CHOLECYSTECTOMY WITH ATTEMPTED INTRAOPERATIVE CHOLANGIOGRAM;  Surgeon: Harl Bowie, MD;  Location: [facility];  Service: General;  Laterality: N/A;   Family History:  Family History  Problem Relation Age of Onset  . Thyroid disease Neg Hx   . Goiter Neg Hx   . Colon cancer Father     Deceased, 46  . Osteoporosis Mother     Living, 61  . Healthy Brother    Social History:  History  Alcohol Use  . 2.4 oz/week  . 4 Glasses of wine per week     History  Drug Use No    History   Social History  . Marital Status: Single    Spouse Name: N/A    Number of Children: N/A  . Years of Education: N/A   Occupational History  . Biologist    Social History Main Topics  . Smoking status: Never Smoker   . Smokeless tobacco: Never Used  . Alcohol Use: 2.4 oz/week    4 Glasses of wine per week  . Drug Use: No  . Sexual Activity: No   Other Topics Concern  . None   Social History Narrative   Lives alone.  She has a BS biology.  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   Additional History:    Sleep: Improving  Appetite:  Improving   Assessment:   Musculoskeletal: Strength & Muscle Tone: within normal limits Gait & Station: normal Patient leans: N/A   Psychiatric Specialty Exam: Physical Exam  Constitutional: She is oriented to person, place, and time.  HENT:  Head: Normocephalic.  Neck: Normal range of motion.  Respiratory: Effort normal.  Neurological: She is alert and oriented to person, place, and time.  Skin: Skin is warm and dry.     Review of Systems  HENT:       Hearing aids    Psychiatric/Behavioral: Negative for suicidal ideas, hallucinations, memory loss and substance abuse. Depression: Improving. Nervous/anxious: Improving. Insomnia: Improving.     Blood pressure 108/73, pulse 114, temperature 98 F (36.7 C), temperature source Oral, resp. rate 18, height 5' 1.75" (1.568 m), weight 47.174 kg (104 lb).Body mass index is 19.19 kg/(m^2).  General Appearance: Casual  Eye Contact::  Good  Speech:  Clear and Coherent  Volume:  Normal  Mood:  Depressed  Affect:  Blunt  Thought Process:  Coherent and Goal Directed  Orientation:  Full (Time, Place, and Person)  Thought Content:  "I feel good"  Suicidal Thoughts:  No  Homicidal Thoughts:  No  Memory:  Immediate;   Good Recent;   Good Remote;   Good  Judgement:  Fair  Insight:  Present  Psychomotor Activity:  Normal  Concentration:  Fair  Recall:  Good  Fund of Knowledge:Fair  Language: Good  Akathisia:  No  Handed:  Right  AIMS (if indicated):     Assets:  Desire for Improvement Housing Vocational/Educational  ADL's:  Intact  Cognition: WNL  Sleep:  Number of Hours: 6.75     Current Medications: Current Facility-Administered Medications  Medication Dose Route Frequency Provider Last Rate Last Dose  . alum & mag hydroxide-simeth (MAALOX/MYLANTA) 200-200-20 MG/5ML suspension 30 mL  30 mL Oral PRN Delfin Gant, NP      . ibuprofen (ADVIL,MOTRIN) tablet 600 mg  600 mg Oral Q8H PRN Delfin Gant, NP      . Derrill Memo ON 04/07/2014] LORazepam (ATIVAN) tablet 1 mg  1 mg Oral Daily Nicholaus Bloom, MD      . multivitamin with minerals tablet 1 tablet  1 tablet Oral Daily Nicholaus Bloom, MD   1 tablet at 04/06/14 [phone]  . thiamine (VITAMIN B-1) tablet 100 mg  100 mg Oral Daily Nicholaus Bloom, MD   100 mg at 04/06/14 1660  . zolpidem (AMBIEN) tablet 5 mg  5 mg Oral QHS PRN Delfin Gant, NP        Lab Results: No results found for this or any  previous visit (from the past 53 hour(s)).  Physical Findings: AIMS: Facial and Oral Movements Muscles of Facial Expression: None, normal Lips and Perioral Area: None, normal Jaw: None, normal Tongue: None, normal,Extremity Movements Upper (arms, wrists, hands, fingers): None, normal Lower (legs, knees, ankles, toes): None, normal, Trunk Movements Neck, shoulders, hips: None, normal, Overall Severity Severity of abnormal movements (highest score from questions above): None, normal Incapacitation due to abnormal movements: None, normal Patient's awareness of abnormal movements (rate only patient's report): No Awareness, Dental Status Current problems with teeth and/or dentures?: No Does patient usually wear dentures?: No  CIWA:  CIWA-Ar Total: 0 COWS:     Treatment Plan Summary: Daily contact with patient to assess and evaluate  symptoms and progress in treatment, Medication management and Plan Will continue current treatment plan without any changes at this time   Continue with current treatment plan  Medical Decision Making:  Established Problem, Stable/Improving (1), Review of Psycho-Social Stressors (1), Review of Last Therapy Session (1) and Review of Medication Regimen & Side Effects (2)  Rankin, Shuvon, FNP-BC 04/06/2014, 2:58 PM I agreed with findings and treatment plan of this patient

## 2014-04-06 NOTE — [facility] Group Notes (Signed)
[facility] Group Notes:  (Nursing/MHT/Case Management/Adjunct)  Date:  04/06/2014  Time:  4:28 PM  Type of Therapy:  Psychoeducational Skills  Participation Level:  Active  Participation Quality:  Appropriate  Affect:  Appropriate  Cognitive:  Appropriate  Insight:  Appropriate  Engagement in Group:  Engaged  Modes of Intervention:  Discussion  Summary of Progress/Problems: Pt did attend self inventory group.      Benancio Deeds Shanta 04/06/2014, 4:28 PM

## 2014-04-06 NOTE — [facility] Group Notes (Signed)
[place] Group Notes:  (Clinical Social Work)  04/06/2014  11:15AM-12:05PM  Summary of Progress/Problems:   The main focus of today's process group was to   1)  discuss the importance of adding supports  2)  define health supports versus unhealthy supports  3)  identify the patient's current unhealthy supports and plan how to handle them  4)  Identify the patient's current healthy supports and plan what to add.  An emphasis was placed on using counselor, doctor, therapy groups, 12-step groups, and problem-specific support groups to expand supports.    The patient expressed full comprehension of the concepts presented, and agreed that there is a need to add more supports.  The patient stated she has current healthy supports in her family and friends, and work is an unhealthy support due to the unrealistic expectations.  She wants to add AA, and has 2 temporary sponsors, wants to find a permanent one.  She would like to add a therapist and psychiatrist to her supports.  She requested discharge today in a 1:1 meeting with CSW, and after discussion with Dr., this clinician made her aware that because her appointments are not currently in place, she will need to wait one more night.  Type of Therapy:  Process Group with Motivational Interviewing  Participation Level:  Active  Participation Quality:  Attentive, Sharing and Supportive  Affect:  Blunted and Depressed  Cognitive:  Alert and Appropriate  Insight:  Engaged  Engagement in Therapy:  Engaged  Modes of Intervention:   Education, Support and Processing, Activity  [employer], LCSW 04/06/2014, 12:15pm

## 2014-04-06 NOTE — Progress Notes (Signed)
Patient ID: Terri Ayala, female   DOB: 09-07-1955, 59 y.o.   MRN: [id number]   D: Pt has been appropriate on the unit today, she has attended all groups and engaged in treatment. Pt reported in group that her depression was a 2, and her hopelessness was a 0. Pt reported being negative SI/HI, no AH/VH noted. Pt reported that her goal was to work hard to go home, to go to group and take meds as prescribed by the doctor. A: 15 min checks continued for patient safety. R: Pt safety maintained.

## 2014-04-06 NOTE — Progress Notes (Signed)
Adult Psychoeducational Group Note  Date:  04/06/2014 Time:  3:11 PM  Group Topic/Focus:  Wrap-Up Group:   The focus of this group is to help patients review their daily goal of treatment and discuss progress on daily workbooks.  Participation Level:  Active  Participation Quality:  Appropriate and Attentive  Affect:  Appropriate  Cognitive:  Alert and Appropriate  Insight: Appropriate and Good  Engagement in Group:  Engaged  Modes of Intervention:  Activity  Additional Comments:  Pt support group will be family friends and she wants to continue to stay with her AA groups  Mahasin Riviere R 04/06/2014, 3:11 PM

## 2014-04-06 NOTE — Progress Notes (Signed)
Writer has observed patient up in the dayroom watching tv and interacting appropriately with peers. Patient attended group and participated. She reports that she is ready for discharge and plans to attend outpatient but would like to find one closer than [facility]. She voiced no complaints and denies si/hi/a/v hallucinations. Support and encouragement given, safety maintained on unit.

## 2014-04-06 NOTE — Progress Notes (Signed)
Patient has been observed in the dayroom this evening watching the football game. She reports that she has had a good day and is discharging on tomorrow. She voiced no complaints or withdrawal symptoms. Support and encouragement given, safety maintained on unit with 15 min checks.

## 2014-04-07 DIAGNOSIS — F331 Major depressive disorder, recurrent, moderate: Secondary | ICD-10-CM | POA: Insufficient documentation

## 2014-04-07 MED ORDER — NORETHINDRONE-ETH ESTRADIOL 1-5 MG-MCG PO TABS: 1.0000 | ORAL_TABLET | Freq: Every day | ORAL | Status: DC

## 2014-04-07 MED ORDER — ZOLPIDEM TARTRATE 5 MG PO TABS: 5.0000 mg | ORAL_TABLET | Freq: Every evening | ORAL | Status: DC | PRN

## 2014-04-07 NOTE — [facility] Suicide Risk Assessment (Signed)
[facility] Discharge Suicide Risk Assessment   Demographic Factors:  Caucasian and Living alone  Total Time spent with patient: 30 minutes  Musculoskeletal: Strength & Muscle Tone: within normal limits Gait & Station: normal Patient leans: N/A  Psychiatric Specialty Exam: Physical Exam  Review of Systems  Constitutional: Negative.   HENT: Negative.   Eyes: Negative.   Respiratory: Negative.   Cardiovascular: Negative.   Gastrointestinal: Negative.   Genitourinary: Negative.   Musculoskeletal: Negative.   Skin: Negative.   Neurological: Negative.   Endo/Heme/Allergies: Negative.   Psychiatric/Behavioral: Positive for depression and substance abuse. The patient is nervous/anxious.     Blood pressure 130/88, pulse 98, temperature 98.3 F (36.8 C), temperature source Oral, resp. rate 20, height 5' 1.75" (1.568 m), weight 47.174 kg (104 lb).Body mass index is 19.19 kg/(m^2).  General Appearance: Fairly Groomed  Engineer, water::  Fair  Speech:  Clear and [id number]  Volume:  Normal  Mood:  Anxious and worried  Affect:  anxious   Thought Process:  Coherent and Goal Directed  Orientation:  Full (Time, Place, and Person)  Thought Content:  plans as she moves on, relapse prevention plan  Suicidal Thoughts:  No  Homicidal Thoughts:  No  Memory:  Immediate;   Fair Recent;   Fair Remote;   Fair  Judgement:  Fair  Insight:  Present  Psychomotor Activity:  Normal  Concentration:  Fair  Recall:  [employer] [place]  Language: Fair  Akathisia:  No  Handed:  Right  AIMS (if indicated):     Assets:  Desire for Improvement Housing Transportation Vocational/Educational  Sleep:  Number of Hours: 6.75  Cognition: WNL  ADL's:  Intact   Have you used any form of tobacco in the last 30 days? (Cigarettes, Smokeless Tobacco, Cigars, and/or Pipes): No  Has this patient used any form of tobacco in the last 30 days? (Cigarettes, Smokeless Tobacco, Cigars, and/or Pipes) No  Mental  Status Per Nursing Assessment::   On Admission:   (denies all)  Current Mental Status by Physician: In full contact with reality. There are no active S/S of withdrawal. There are no active SI plans or intent. Her mood is anxious, worried. She is going to go back to work on Wednesday. States that she is not going to let the situation at work affect her anymore. She states she plans to have a better balance in her life. She plans to re connect with the [place] community, get more involved in outside activities. She is now aware that there are people out there that care about her and that she need to ask for the help when she needs it   Loss Factors: Decline in physical health  Historical Factors: NA  Risk Reduction Factors:   Employed and Positive social support  Continued Clinical Symptoms:  Depression:   Comorbid alcohol abuse/dependence Alcohol/Substance Abuse/Dependencies  Cognitive Features That Contribute To Risk:  Closed-mindedness, Polarized thinking and Thought constriction (tunnel vision)    Suicide Risk:  Minimal: No identifiable suicidal ideation.  Patients presenting with no risk factors but with morbid ruminations; may be classified as minimal risk based on the severity of the depressive symptoms  Principal Problem: Alcohol use disorder, severe, dependence Discharge Diagnoses:  Patient Active Problem List   Diagnosis Date Noted  . Alcohol use disorder, severe, dependence [F10.20] 04/03/2014  . Alcohol use disorder, moderate, dependence [F10.20]   . Alcohol use disorder, severe [F10.10] 06/18/2013  . MDD (major depressive disorder) [F32.2] 06/18/2013  .  Lumbosacral radiculopathy at L5 [M54.17] 05/20/2013  . Protein-calorie malnutrition, severe [E43] 03/18/2013  . Nonspecific elevation of levels of transaminase or lactic acid dehydrogenase (LDH) [R74.0] 03/16/2013  . Hepatic steatosis [K76.0] 03/16/2013  . Chronic cholecystitis [K81.1] 03/14/2013  . Cervical arthritis  [M46.92] 10/12/2012  . Palpitations [R00.2] 11/29/2010  . SVT (supraventricular tachycardia) [I47.1] 11/29/2010  . Mitral valve prolapse [I34.1] 11/29/2010  . Decreased libido [R68.82] 11/15/2010  . GOITER, MULTINODULAR [E04.2] 08/17/2009  . ANXIETY [F41.1] 08/17/2009  . MIGRAINE HEADACHE [G43.909] 08/17/2009  . HEARING LOSS [H91.90] 08/17/2009  . OSTEOPOROSIS [M81.0] 08/17/2009  . ASYMPTOMATIC POSTMENOPAUSAL STATUS [Z78.0] 08/17/2009  . DYSPHAGIA [R13.19] 06/19/2007    Follow-up Information    Follow up with [facility] On 04/08/2014.   Why:  Tuesday, April 08, 2014 at [facility] information:   [address] [place], [place]   [address]  [phone]      Plan Of Care/Follow-up recommendations:  Activity:  as tolerated Diet:  regular Follow up [facility]. She is planning to re join their [facility] IOP Is patient on multiple antipsychotic therapies at discharge:  No   Has Patient had three or more failed trials of antipsychotic monotherapy by history:  No  Recommended Plan for Multiple Antipsychotic Therapies: NA    Georgeanna Radziewicz A 04/07/2014, 1:26 PM

## 2014-04-07 NOTE — Discharge Summary (Signed)
Physician Discharge Summary Note  Patient:  Terri Ayala is an 59 y.o., female MRN:  [id number] DOB:  1955-10-31 Patient phone:  [phone] (home)  Patient address:   [place] [address],  Total Time spent with patient: Greater than 30 minutes  Date of Admission:  04/03/2014 Date of Discharge: 04/07/14  Reason for Admission: Worsening symptoms of depression, alcohol/drug withdrawal.  Principal Problem: Alcohol use disorder, severe, dependence Discharge Diagnoses: Patient Active Problem List   Diagnosis Date Noted  . Major depressive disorder, recurrent episode, moderate [F33.1]   . Alcohol use disorder, severe, dependence [F10.20] 04/03/2014  . Alcohol use disorder, moderate, dependence [F10.20]   . Alcohol use disorder, severe [F10.10] 06/18/2013  . MDD (major depressive disorder) [F32.2] 06/18/2013  . Lumbosacral radiculopathy at L5 [M54.17] 05/20/2013  . Protein-calorie malnutrition, severe [E43] 03/18/2013  . Nonspecific elevation of levels of transaminase or lactic acid dehydrogenase (LDH) [R74.0] 03/16/2013  . Hepatic steatosis [K76.0] 03/16/2013  . Chronic cholecystitis [K81.1] 03/14/2013  . Cervical arthritis [M46.92] 10/12/2012  . Palpitations [R00.2] 11/29/2010  . SVT (supraventricular tachycardia) [I47.1] 11/29/2010  . Mitral valve prolapse [I34.1] 11/29/2010  . Decreased libido [R68.82] 11/15/2010  . GOITER, MULTINODULAR [E04.2] 08/17/2009  . ANXIETY [F41.1] 08/17/2009  . MIGRAINE HEADACHE [G43.909] 08/17/2009  . HEARING LOSS [H91.90] 08/17/2009  . OSTEOPOROSIS [M81.0] 08/17/2009  . ASYMPTOMATIC POSTMENOPAUSAL STATUS [Z78.0] 08/17/2009  . DYSPHAGIA [R13.19] 06/19/2007   Musculoskeletal: Strength & Muscle Tone: within normal limits Gait & Station: normal Patient leans: N/A  Psychiatric Specialty Exam: Physical Exam  Psychiatric: Her speech is normal and behavior is normal. Judgment and thought content normal. Her mood appears not anxious.  Her affect is not angry, not blunt, not labile and not inappropriate. Cognition and memory are normal. She does not exhibit a depressed mood.    Review of Systems  Constitutional: Negative.   HENT: Negative.   Eyes: Negative.   Respiratory: Negative.   Cardiovascular: Negative.   Gastrointestinal: Negative.   Genitourinary: Negative.   Musculoskeletal: Negative.   Skin: Negative.   Neurological: Negative.   Endo/Heme/Allergies: Negative.   Psychiatric/Behavioral: Positive for depression (Stable) and substance abuse (Alcoholism, chronic). Negative for suicidal ideas, hallucinations and memory loss. The patient has insomnia (Stable). The patient is not nervous/anxious.     Blood pressure 130/88, pulse 98, temperature 98.3 F (36.8 C), temperature source Oral, resp. rate 20, height 5' 1.75" (1.568 m), weight 47.174 kg (104 lb).Body mass index is 19.19 kg/(m^2).  See Md's SRA   Past Medical History:  Past Medical History  Diagnosis Date  . Low back pain   . Anxiety   . Migraines   . GOITER, MULTINODULAR 08/17/2009  . OSTEOPOROSIS 08/17/2009  . ASYMPTOMATIC POSTMENOPAUSAL STATUS 08/17/2009  . DYSPHAGIA 06/19/2007  . Supraventricular tachycardia   . Hyperlipidemia   . Mitral valve prolapse   . PONV (postoperative nausea and vomiting)     Past Surgical History  Procedure Laterality Date  . Laporoscopic abdominal surgery    . Cervix surgery    . Cholecystectomy N/A 03/14/2013    Procedure: LAPAROSCOPIC CHOLECYSTECTOMY WITH ATTEMPTED INTRAOPERATIVE CHOLANGIOGRAM;  Surgeon: Harl Bowie, MD;  Location: [facility];  Service: General;  Laterality: N/A;   Family History:  Family History  Problem Relation Age of Onset  . Thyroid disease Neg Hx   . Goiter Neg Hx   . Colon cancer Father     Deceased, 83  . Osteoporosis Mother     Living, 44  .  Healthy Brother    Social History:  History  Alcohol Use  . 2.4 oz/week  . 4 Glasses of wine per week     History  Drug Use No     History   Social History  . Marital Status: Single    Spouse Name: N/A    Number of Children: N/A  . Years of Education: N/A   Occupational History  . Biologist    Social History Main Topics  . Smoking status: Never Smoker   . Smokeless tobacco: Never Used  . Alcohol Use: 2.4 oz/week    4 Glasses of wine per week  . Drug Use: No  . Sexual Activity: No   Other Topics Concern  . None   Social History Narrative   Lives alone.  She has a BS biology.   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   Risk to Self: Is patient at risk for suicide?: No What has been your use of drugs/alcohol within the last 12 months?: Drinking daily prior to hospitalization- approximately 1 bottle of wine Risk to Others: No Prior Inpatient Therapy: No Prior Outpatient Therapy: No Level of Care:  OP  Hospital Course:  59 Y/O female with history of alcohol dependence who was with [place] in April last year. She states that after she was D/C she went to a CD IOP at [facility]. She states she had been able to abstain for a while. Seems she has had episodes of heavy drinking on and off. Things have gotten worst for the last few months when there were some changes at work. She explained that a peer was placed in a supervisory position and that she has not done a good job creating a very stressful work environment. States two weeks ago went out with a friend, drank couple of glassess of wine. She continued to drink was drinking at least half a bottle of wine a day ( in the ED it was reported she had been drinking one to two bottles of wine).   Zonnique was admitted to the hospital with her UDS test results showing positive Benzodiazepine & her blood alcohol level at 346. She was here for alcohol/drug detox. Issis's recent lab reports indicated elevated liver enzymes from chronic alcoholism. As a result, not a candidate for Librium detox protocols. Her detoxification treatment was achieved using Ativan  detox regimen on a tapering dose format. This was used in place of Librium detox protocols because Librium is a long acting benzodiazepine with a long half life. If Librium was used for this detox treatment , will impose on a already compromised liver functions. By using Ativan detox regimen, she received a cleaner detoxification treatment without the lingering adverse effects of the long acting Librium capsules. She was also enrolled in the group counseling sessions, AA/NA meetings being offered and held on this unit. She participated and learned coping skills. Kynzlie was resumed on her other medication regimen for her other medical conditions presented. She tolerated her treatment regimen without any significant adverse effects and or reactions.  Kerstin has completed detox treatment and her mood is stable. This is evidenced by her reports of stable mood and absence of substance withdrawal symptoms. She will resume treatment with Willa Rough at the [facility] in [place], [place]. She is provided with all the necessary information needed to contact & make this appointment without problems. Upon discharge, She adamantly denies any suicidal, homicidal ideations, auditory, visual hallucinations, delusional thoughts, paranoia  and or withdrawal symptoms. She left [facility] with all personal belongings in no apparent distress. Transportation per patient arranged.  Consults:  psychiatry  Significant Diagnostic Studies:  labs: CBC with diff, CMP, UDS, toxicology tests, U/A, reports reviewed, no changes  Discharge Vitals:   Blood pressure 130/88, pulse 98, temperature 98.3 F (36.8 C), temperature source Oral, resp. rate 20, height 5' 1.75" (1.568 m), weight 47.174 kg (104 lb). Body mass index is 19.19 kg/(m^2). Lab Results:   No results found for this or any previous visit (from the past 72 hour(s)).  Physical Findings: AIMS: Facial and Oral Movements Muscles of Facial Expression: None, normal Lips and Perioral  Area: None, normal Jaw: None, normal Tongue: None, normal,Extremity Movements Upper (arms, wrists, hands, fingers): None, normal Lower (legs, knees, ankles, toes): None, normal, Trunk Movements Neck, shoulders, hips: None, normal, Overall Severity Severity of abnormal movements (highest score from questions above): None, normal Incapacitation due to abnormal movements: None, normal Patient's awareness of abnormal movements (rate only patient's report): No Awareness, Dental Status Current problems with teeth and/or dentures?: No Does patient usually wear dentures?: No  CIWA:  CIWA-Ar Total: 0 COWS:     See Psychiatric Specialty Exam and Suicide Risk Assessment completed by Attending Physician prior to discharge.  Discharge destination:  Home  Is patient on multiple antipsychotic therapies at discharge:  No   Has Patient had three or more failed trials of antipsychotic monotherapy by history:  No  Recommended Plan for Multiple Antipsychotic Therapies: NA    Medication List    STOP taking these medications        alendronate 70 MG tablet  Commonly known as:  FOSAMAX     aspirin 81 MG tablet     calcium carbonate 200 MG capsule     cholecalciferol 1000 UNITS tablet  Commonly known as:  VITAMIN D     DULoxetine 20 MG capsule  Commonly known as:  CYMBALTA     gabapentin 100 MG capsule  Commonly known as:  NEURONTIN     glucosamine-chondroitin 500-400 MG tablet     metoCLOPramide 10 MG tablet  Commonly known as:  REGLAN     pantoprazole 40 MG tablet  Commonly known as:  PROTONIX     simvastatin 20 MG tablet  Commonly known as:  ZOCOR     SUMAtriptan 100 MG tablet  Commonly known as:  IMITREX     traZODone 50 MG tablet  Commonly known as:  DESYREL      TAKE these medications      Indication   norethindrone-ethinyl estradiol 1-5 MG-MCG Tabs  Commonly known as:  FEMHRT 1/5  Take 1 tablet by mouth daily. For Osteoporosis   Indication:  Postmenopausal  Osteoporosis     zolpidem 5 MG tablet  Commonly known as:  AMBIEN  Take 1 tablet (5 mg total) by mouth at bedtime as needed for sleep.   Indication:  Trouble Sleeping       Follow-up Information    Follow up with [facility] On 04/08/2014.   Why:  Tuesday, April 08, 2014 at [facility] information:   [address] [place], [place]   [address]  [phone]    Follow-up recommendations: Activity:  As tolerated Diet: As recommended by your primary care doctor. Keep all scheduled follow-up appointments as recommended.   Comments: Take all your medications as prescribed by your mental healthcare provider. Report any adverse effects and or reactions from your  medicines to your outpatient provider promptly. Patient is instructed and cautioned to not engage in alcohol and or illegal drug use while on prescription medicines. In the event of worsening symptoms, patient is instructed to call the crisis hotline, 911 and or go to the nearest ED for appropriate evaluation and treatment of symptoms. Follow-up with your primary care provider for your other medical issues, concerns and or health care needs.   Total Discharge Time: Greater than 30 minutes  Signed: Encarnacion Slates, PMHNP-BC 04/08/2014, 4:29 PM  I personally assessed the patient and formulated the plan Geralyn Flash A. Sabra Heck, M.D.

## 2014-04-07 NOTE — Progress Notes (Signed)
Discharge note: Pt received both written and verbal discharge instructions. Pt verbalized understanding of discharge instructions. Pt agreed to f/u appt and med regimen. Pt received prescriptions, note for work and discharge summary. Pt received belongings from room and locker. Pt denied suicidal thoughts at time of discharge. Pt denied having any withdrawal s/s.   Pt taken to lobby to wait for transportation.

## 2014-04-07 NOTE — Progress Notes (Signed)
Pt resting in bed with eyes closed.  No distress observed.  Respirations even/unlabored.  Safety maintained with q15 minute checks.

## 2014-04-07 NOTE — BHH Group Notes (Signed)
[facility] LCSW Aftercare Discharge Planning Group Note   04/07/2014 12:29 PM    Participation Quality:  [place]  Mood/Affect:  Appropriate  Depression Rating:  2  Anxiety Rating:  3  Thoughts of Suicide:  No  Will you contract for safety?   NA  Current AVH:  No  Plan for Discharge/Comments:  Patient attended discharge planning group and actively participated in group. He reports being ready for discharge today.  Follow up with [facility].  Suicide prevention education reviewed and SPE document provided.   Transportation Means: Patient has transportation.   Supports:  Patient has a support system.   Lester Platas, [place] Post

## 2014-04-07 NOTE — BHH Suicide Risk Assessment (Signed)
[facility] INPATIENT:  Family/Significant Other Suicide Prevention Education  Suicide Prevention Education:  Patient Refusal for Family/Significant Other Suicide Prevention Education: The patient Terri Ayala has refused to provide written consent for family/significant other to be provided Family/Significant Other Suicide Prevention Education during admission and/or prior to discharge.  Physician notified.  Concha Pyo 04/07/2014, 12:49 PM

## 2014-04-07 NOTE — Progress Notes (Signed)
  [facility] Adult Case Management Discharge Plan :  Will you be returning to the same living situation after discharge:  Yes, patient is returning home. At discharge, do you have transportation home?: Yes,  Patient is arranging transportation home. Do you have the ability to pay for your medications: Yes,  Patient is able to afford medications.  Release of information consent forms completed and in the chart;  Patient's signature needed at discharge.  Patient to Follow up at: Follow-up Information    Follow up with [facility] On 04/08/2014.   Why:  Tuesday, April 08, 2014 at [facility] information:   [address] [place], [place]   [address]  [phone]      Patient denies SI/HI:Patient no longer endorsing SI/HI or other thoughts of self harm.   Safety Planning and Suicide Prevention discussed:.Reviewed with all patients during discharge planning group  Has patient been referred to the Quitline?  No referral to Quitline needed. Concha Pyo 04/07/2014, 12:26 PM

## 2014-04-10 ENCOUNTER — Other Ambulatory Visit: Payer: Self-pay | Admitting: Family Medicine

## 2014-04-10 NOTE — Telephone Encounter (Signed)
Is this okay?

## 2014-04-10 NOTE — Progress Notes (Signed)
Patient Discharge Instructions:  After Visit Summary (AVS):   Faxed to:  04/10/14 Discharge Summary Note:   Faxed to:  04/10/14 Psychiatric Admission Assessment Note:   Faxed to:  04/10/14 Suicide Risk Assessment - Discharge Assessment:   Faxed to:  04/10/14 Faxed/Sent to the Next Level Care provider:  04/10/14 Faxed to [facility] [place], 04/10/2014, 3:05 PM

## 2014-04-15 ENCOUNTER — Other Ambulatory Visit: Payer: Self-pay | Admitting: Family Medicine

## 2014-04-15 NOTE — Telephone Encounter (Signed)
IS THIS OKAY TO REFILL 

## 2014-04-18 ENCOUNTER — Other Ambulatory Visit: Payer: Self-pay | Admitting: Family Medicine

## 2014-04-18 NOTE — Telephone Encounter (Signed)
Do not refill.  

## 2014-04-18 NOTE — Telephone Encounter (Signed)
Is this okay to refill? 

## 2014-04-24 ENCOUNTER — Ambulatory Visit (INDEPENDENT_AMBULATORY_CARE_PROVIDER_SITE_OTHER): Payer: Federal, State, Local not specified - PPO | Admitting: Family Medicine

## 2014-04-24 DIAGNOSIS — F1010 Alcohol abuse, uncomplicated: Secondary | ICD-10-CM

## 2014-04-24 DIAGNOSIS — F321 Major depressive disorder, single episode, moderate: Secondary | ICD-10-CM

## 2014-04-24 DIAGNOSIS — L720 Epidermal cyst: Secondary | ICD-10-CM

## 2014-04-24 MED ORDER — DULOXETINE HCL 60 MG PO CPEP: 60.0000 mg | ORAL_CAPSULE | Freq: Every day | ORAL | Status: DC

## 2014-04-24 NOTE — Progress Notes (Signed)
   Subjective:    Patient ID: Terri Ayala, female    DOB: 01/08/1956, 59 y.o.   MRN: [id number]  HPI She is here for consultation. She was recently hospitalized for treatment of depression and alcohol abuse. She is now going to outpatient intensive therapy at [facility] in [place]. She is very happy with this. She is also going to AA at least 4 times per week. She has been on Cymbalta in the past and has used Xanax mainly for work-related stress. She would like a refill on these. She did state that the Cymbalta did work quite well but she ran into difficulties over the holidays causing depression. She also has a lesion present on the right fourth finger.  Review of Systems     Objective:   Physical Exam  Alert and in no distress with appropriate affect . 1 cm round smooth movable nontender lesion noted over the distal fourth finger.      Assessment & Plan:  Major depressive disorder, single episode, moderate - Plan: DULoxetine (CYMBALTA) 60 MG capsule  Alcohol abuse  Inclusion cyst  I complimented her on the work she was doing in the outpatient program as well as with AA. I will increase her Cymbalta to 60 mg. Explained that I did not want to give her Xanax for the work-related stress. Discussed stress and stress management with her also recommend she follow-up with the psychiatrist affiliated with the outpatient program for further consultation concerning her present medication dosing which is now 60 as opposed to the previous 30 of Cymbalta. She is to follow-up with me in 6 months roughly. Explained that down the road I could easily continue her on her medications of the psychiatrist put her on but not at the present time.

## 2014-06-11 ENCOUNTER — Other Ambulatory Visit: Payer: Self-pay | Admitting: Family Medicine

## 2014-06-11 ENCOUNTER — Telehealth: Payer: Self-pay | Admitting: Family Medicine

## 2014-06-11 NOTE — Telephone Encounter (Signed)
Pt called for refill of migraine medicine sent to [facility] on [address]

## 2014-06-12 ENCOUNTER — Emergency Department (HOSPITAL_COMMUNITY)
Admission: EM | Admit: 2014-06-12 | Discharge: 2014-06-12 | Disposition: A | Discharge status: Home / Self Care | Payer: Federal, State, Local not specified - PPO | Attending: Emergency Medicine | Admitting: Emergency Medicine

## 2014-06-12 ENCOUNTER — Emergency Department (HOSPITAL_COMMUNITY): Payer: Federal, State, Local not specified - PPO

## 2014-06-12 ENCOUNTER — Encounter (HOSPITAL_COMMUNITY): Payer: Self-pay

## 2014-06-12 DIAGNOSIS — Z8639 Personal history of other endocrine, nutritional and metabolic disease: Secondary | ICD-10-CM | POA: Insufficient documentation

## 2014-06-12 DIAGNOSIS — Y998 Other external cause status: Secondary | ICD-10-CM | POA: Diagnosis not present

## 2014-06-12 DIAGNOSIS — Z8679 Personal history of other diseases of the circulatory system: Secondary | ICD-10-CM | POA: Insufficient documentation

## 2014-06-12 DIAGNOSIS — Z8719 Personal history of other diseases of the digestive system: Secondary | ICD-10-CM | POA: Diagnosis not present

## 2014-06-12 DIAGNOSIS — S0181XA Laceration without foreign body of other part of head, initial encounter: Secondary | ICD-10-CM | POA: Diagnosis not present

## 2014-06-12 DIAGNOSIS — Y9389 Activity, other specified: Secondary | ICD-10-CM | POA: Insufficient documentation

## 2014-06-12 DIAGNOSIS — Z79899 Other long term (current) drug therapy: Secondary | ICD-10-CM | POA: Insufficient documentation

## 2014-06-12 DIAGNOSIS — S0993XA Unspecified injury of face, initial encounter: Secondary | ICD-10-CM | POA: Diagnosis present

## 2014-06-12 DIAGNOSIS — F10120 Alcohol abuse with intoxication, uncomplicated: Secondary | ICD-10-CM | POA: Insufficient documentation

## 2014-06-12 DIAGNOSIS — G43909 Migraine, unspecified, not intractable, without status migrainosus: Secondary | ICD-10-CM | POA: Insufficient documentation

## 2014-06-12 DIAGNOSIS — W01198A Fall on same level from slipping, tripping and stumbling with subsequent striking against other object, initial encounter: Secondary | ICD-10-CM | POA: Insufficient documentation

## 2014-06-12 DIAGNOSIS — F419 Anxiety disorder, unspecified: Secondary | ICD-10-CM | POA: Diagnosis not present

## 2014-06-12 DIAGNOSIS — Y9289 Other specified places as the place of occurrence of the external cause: Secondary | ICD-10-CM | POA: Insufficient documentation

## 2014-06-12 DIAGNOSIS — W19XXXA Unspecified fall, initial encounter: Secondary | ICD-10-CM

## 2014-06-12 DIAGNOSIS — Z8739 Personal history of other diseases of the musculoskeletal system and connective tissue: Secondary | ICD-10-CM | POA: Diagnosis not present

## 2014-06-12 DIAGNOSIS — F10920 Alcohol use, unspecified with intoxication, uncomplicated: Secondary | ICD-10-CM

## 2014-06-12 HISTORY — DX: Alcoholic fatty liver: K70.0

## 2014-06-12 HISTORY — DX: Alcohol dependence, uncomplicated: F10.20

## 2014-06-12 MED ORDER — LORAZEPAM 1 MG PO TABS
1.0000 mg | ORAL_TABLET | Freq: Once | ORAL | Status: AC
  Administered 2014-06-12: 1 mg via ORAL
  Filled 2014-06-12: qty 1

## 2014-06-12 MED ORDER — HYDROGEN PEROXIDE 3 % EX SOLN
CUTANEOUS | Status: AC
  Administered 2014-06-12: 04:00:00
  Filled 2014-06-12: qty 473

## 2014-06-12 MED ORDER — LORAZEPAM 0.5 MG PO TABS: 0.5000 mg | ORAL_TABLET | Freq: Three times a day (TID) | ORAL | Status: DC | PRN

## 2014-06-12 NOTE — ED Provider Notes (Addendum)
CSN: [id number]     Arrival date & time 06/12/14  0201 History   First MD Initiated Contact with Patient 06/12/14 [phone]     Chief Complaint  Patient presents with  . Fall     (Consider location/radiation/quality/duration/timing/severity/associated sxs/prior Treatment) HPI  Past Medical History  Diagnosis Date  . Low back pain   . Anxiety   . Migraines   . GOITER, MULTINODULAR 08/17/2009  . OSTEOPOROSIS 08/17/2009  . ASYMPTOMATIC POSTMENOPAUSAL STATUS 08/17/2009  . DYSPHAGIA 06/19/2007  . Supraventricular tachycardia   . Hyperlipidemia   . Mitral valve prolapse   . PONV (postoperative nausea and vomiting)   . Fatty liver, alcoholic   . Alcoholism    Past Surgical History  Procedure Laterality Date  . Laporoscopic abdominal surgery    . Cervix surgery    . Cholecystectomy N/A 03/14/2013    Procedure: LAPAROSCOPIC CHOLECYSTECTOMY WITH ATTEMPTED INTRAOPERATIVE CHOLANGIOGRAM;  Surgeon: Harl Bowie, MD;  Location: [facility];  Service: General;  Laterality: N/A;   Family History  Problem Relation Age of Onset  . Thyroid disease Neg Hx   . Goiter Neg Hx   . Colon cancer Father     Deceased, 26  . Osteoporosis Mother     Living, 2  . Healthy Brother    History  Substance Use Topics  . Smoking status: Never Smoker   . Smokeless tobacco: Never Used  . Alcohol Use: Yes     Comment: bottle of wine daily   OB History    No data available     Review of Systems    Allergies  Codeine; Doxycycline; and Tetracycline hcl  Home Medications   Prior to Admission medications   Medication Sig Start Date End Date Taking? Authorizing Provider  DULoxetine (CYMBALTA) 60 MG capsule Take 1 capsule (60 mg total) by mouth daily. 04/24/14  Yes Denita Lung, MD  norethindrone-ethinyl estradiol (FEMHRT 1/5) 1-5 MG-MCG TABS Take 1 tablet by mouth daily. For Osteoporosis 04/07/14  Yes Encarnacion Slates, NP  pantoprazole (PROTONIX) 40 MG tablet TAKE 1 TABLET (40 MG TOTAL) BY MOUTH DAILY.  04/10/14  Yes Denita Lung, MD  SUMAtriptan (IMITREX) 100 MG tablet TAKE 1 TABLET BY MOUTH EVERY 2 HOURS AS NEEDED 04/10/14  Yes Denita Lung, MD  SUMAtriptan (IMITREX) 100 MG tablet TAKE 1 TABLET BY MOUTH EVERY 2 HOURS AS NEEDED Patient not taking: Reported on 06/12/2014 06/11/14   Denita Lung, MD  zolpidem (AMBIEN) 5 MG tablet Take 1 tablet (5 mg total) by mouth at bedtime as needed for sleep. Patient not taking: Reported on 04/24/2014 04/07/14   Encarnacion Slates, NP   BP 101/64 mmHg  Pulse 99  Temp(Src) 98 F (36.7 C) (Oral)  Resp 16  Ht 5\' 2"  (1.575 m)  Wt 110 lb (49.896 kg)  BMI 20.11 kg/m2  SpO2 100% Physical Exam  Constitutional: She appears well-developed and well-nourished.  HENT:  Head: Normocephalic.  Neck: Normal range of motion.  Cardiovascular: Normal rate.   Musculoskeletal: Normal range of motion.  Skin:  laceration to forhead    ED Course  Procedures (including critical care time) Labs Review Labs Reviewed - No data to display  Imaging Review Ct Head Wo Contrast  06/12/2014   CLINICAL DATA:  Fall at home. Awoke with head covered in blood. Left upper forehead laceration.  EXAM: CT HEAD WITHOUT CONTRAST  TECHNIQUE: Contiguous axial images were obtained from the base of the skull through the vertex without intravenous  contrast.  COMPARISON:  None.  FINDINGS: Skull and Sinuses:Probable bilateral forehead contusion, with laceration on the left. No underlying fracture.  Orbits: No acute abnormality.  Brain: No evidence of acute infarction, hemorrhage, hydrocephalus, or mass lesion/mass effect. Mild cerebral and cerebellar atrophy. There is mild gliosis and dystrophic calcification in the lower left cerebellum.  IMPRESSION: 1. No evidence of intracranial injury. 2. Forehead contusion/laceration without fracture. 3. Mild atrophy.   Electronically Signed   By: Monte Fantasia M.D.   On: 06/12/2014 04:33     EKG Interpretation None      MDM   Final diagnoses:  Roanna Banning, NP 06/12/14 0554  Veatrice Kells, MD 06/12/14 Mine La Motte, NP 07/03/14 2335  Veatrice Kells, MD 07/05/14 2303

## 2014-06-12 NOTE — Discharge Instructions (Signed)
Alcohol Intoxication °Alcohol intoxication occurs when you drink enough alcohol that it affects your ability to function. It can be mild or very severe. Drinking a lot of alcohol in a short time is called binge drinking. This can be very harmful. Drinking alcohol can also be more dangerous if you are taking medicines or other drugs. Some of the effects caused by alcohol may include: °· Loss of coordination. °· Changes in mood and behavior. °· Unclear thinking. °· Trouble talking (slurred speech). °· Throwing up (vomiting). °· Confusion. °· Slowed breathing. °· Twitching and shaking (seizures). °· Loss of consciousness. °HOME CARE °· Do not drive after drinking alcohol. °· Drink enough water and fluids to keep your pee (urine) clear or pale yellow. Avoid caffeine. °· Only take medicine as told by your doctor. °GET HELP IF: °· You throw up (vomit) many times. °· You do not feel better after a few days. °· You frequently have alcohol intoxication. Your doctor can help decide if you should see a substance use treatment counselor. °GET HELP RIGHT AWAY IF: °· You become shaky when you stop drinking. °· You have twitching and shaking. °· You throw up blood. It may look bright red or like coffee grounds. °· You notice blood in your poop (bowel movements). °· You become lightheaded or pass out (faint). °MAKE SURE YOU:  °· Understand these instructions. °· Will watch your condition. °· Will get help right away if you are not doing well or get worse. °Document Released: 08/03/2007 Document Revised: 10/17/2012 Document Reviewed: 07/20/2012 °ExitCare® Patient Information ©2015 [employer]. This information is not intended to replace advice given to you by your health care provider. Make sure you discuss any questions you have with your health care provider. ° °

## 2014-06-12 NOTE — ED Notes (Signed)
Patient states she drinks a bottle of wine daily. Today patient states she "fell after noon, and woke up in the evening covered in blood" patient states she called a friend and then called 911. Patient presents with a 3cm laceration to the left upper forehead. Denies NV, denies pain anywhere else. Patient does not know how much time passed from when she fell from when she woke up.

## 2014-06-12 NOTE — ED Provider Notes (Signed)
CSN: [id number]     Arrival date & time 06/12/14  0201 History   First MD Initiated Contact with Patient 06/12/14 [phone]     Chief Complaint  Patient presents with  . Fall     (Consider location/radiation/quality/duration/timing/severity/associated sxs/prior Treatment) HPI Comments: Ms. Zertuche is an alcoholic.  She's been drinking slightly more than normal over the past several days.  She states about noon yesterday she fell striking her head.  She is seems on the floor.  She woke up, and blood.  She called a friend and then 911 presents to the emergency department with a laceration to her upper left for head.  Denies any loss of consciousness, but does not remember the fall.  Immediately afterward, so it is uncertain if she was knocked out or passed out  Patient is a 59 y.o. female presenting with fall. The history is provided by the patient.  Fall This is a new problem. The current episode started yesterday. The problem has been unchanged. Associated symptoms include headaches. Pertinent negatives include no chest pain, fever or vomiting. Nothing aggravates the symptoms. She has tried nothing for the symptoms. The treatment provided no relief.    Past Medical History  Diagnosis Date  . Low back pain   . Anxiety   . Migraines   . GOITER, MULTINODULAR 08/17/2009  . OSTEOPOROSIS 08/17/2009  . ASYMPTOMATIC POSTMENOPAUSAL STATUS 08/17/2009  . DYSPHAGIA 06/19/2007  . Supraventricular tachycardia   . Hyperlipidemia   . Mitral valve prolapse   . PONV (postoperative nausea and vomiting)   . Fatty liver, alcoholic   . Alcoholism    Past Surgical History  Procedure Laterality Date  . Laporoscopic abdominal surgery    . Cervix surgery    . Cholecystectomy N/A 03/14/2013    Procedure: LAPAROSCOPIC CHOLECYSTECTOMY WITH ATTEMPTED INTRAOPERATIVE CHOLANGIOGRAM;  Surgeon: Harl Bowie, MD;  Location: [place];  Service: General;  Laterality: N/A;   Family History  Problem Relation Age of Onset   . Thyroid disease Neg Hx   . Goiter Neg Hx   . Colon cancer Father     Deceased, 39  . Osteoporosis Mother     Living, 51  . Healthy Brother    History  Substance Use Topics  . Smoking status: Never Smoker   . Smokeless tobacco: Never Used  . Alcohol Use: Yes     Comment: bottle of wine daily   OB History    No data available     Review of Systems  Constitutional: Negative for fever.  Cardiovascular: Negative for chest pain.  Gastrointestinal: Negative for vomiting.  Skin: Positive for wound.  Neurological: Positive for headaches. Negative for dizziness.  All other systems reviewed and are negative.     Allergies  Codeine; Doxycycline; and Tetracycline hcl  Home Medications   Prior to Admission medications   Medication Sig Start Date End Date Taking? Authorizing Provider  DULoxetine (CYMBALTA) 60 MG capsule Take 1 capsule (60 mg total) by mouth daily. 04/24/14  Yes Denita Lung, MD  norethindrone-ethinyl estradiol (FEMHRT 1/5) 1-5 MG-MCG TABS Take 1 tablet by mouth daily. For Osteoporosis 04/07/14  Yes Encarnacion Slates, NP  pantoprazole (PROTONIX) 40 MG tablet TAKE 1 TABLET (40 MG TOTAL) BY MOUTH DAILY. 04/10/14  Yes Denita Lung, MD  SUMAtriptan (IMITREX) 100 MG tablet TAKE 1 TABLET BY MOUTH EVERY 2 HOURS AS NEEDED 04/10/14  Yes Denita Lung, MD  SUMAtriptan (IMITREX) 100 MG tablet TAKE 1 TABLET BY MOUTH EVERY  2 HOURS AS NEEDED Patient not taking: Reported on 06/12/2014 06/11/14   Denita Lung, MD  zolpidem (AMBIEN) 5 MG tablet Take 1 tablet (5 mg total) by mouth at bedtime as needed for sleep. Patient not taking: Reported on 04/24/2014 04/07/14   Encarnacion Slates, NP   BP 101/64 mmHg  Pulse 99  Temp(Src) 98 F (36.7 C) (Oral)  Resp 16  Ht 5\' 2"  (1.575 m)  Wt 110 lb (49.896 kg)  BMI 20.11 kg/m2  SpO2 100% Physical Exam  Constitutional: She is oriented to person, place, and time. She appears well-developed and well-nourished.  HENT:  Head: Normocephalic.  Eyes:  Pupils are equal, round, and reactive to light.  Neck: Normal range of motion.  Cardiovascular: Normal rate and regular rhythm.   Pulmonary/Chest: Effort normal.  Abdominal: Soft. She exhibits no distension.  Musculoskeletal: Normal range of motion. She exhibits no edema or tenderness.  Neurological: She is alert and oriented to person, place, and time.  Skin: Skin is warm.   2 CM jagged laceration to the left upper for head into the hairline  Nursing note and vitals reviewed.   ED Course  Procedures (including critical care time) Labs Review Labs Reviewed - No data to display  Imaging Review Ct Head Wo Contrast  06/12/2014   CLINICAL DATA:  Fall at home. Awoke with head covered in blood. Left upper forehead laceration.  EXAM: CT HEAD WITHOUT CONTRAST  TECHNIQUE: Contiguous axial images were obtained from the base of the skull through the vertex without intravenous contrast.  COMPARISON:  None.  FINDINGS: Skull and Sinuses:Probable bilateral forehead contusion, with laceration on the left. No underlying fracture.  Orbits: No acute abnormality.  Brain: No evidence of acute infarction, hemorrhage, hydrocephalus, or mass lesion/mass effect. Mild cerebral and cerebellar atrophy. There is mild gliosis and dystrophic calcification in the lower left cerebellum.  IMPRESSION: 1. No evidence of intracranial injury. 2. Forehead contusion/laceration without fracture. 3. Mild atrophy.   Electronically Signed   By: Monte Fantasia M.D.   On: 06/12/2014 04:33     EKG Interpretation None     Laceration is superficial and will be steri stripped  Patient will be observed in the emergency department until morning until clinically sober MDM   Final diagnoses:  Roanna Banning, NP 06/12/14 2774  Veatrice Kells, MD 06/12/14 1287

## 2014-06-12 NOTE — ED Notes (Signed)
Ready to be discharged per [facility].

## 2014-06-12 NOTE — ED Notes (Signed)
Patient lying in bed at this time AOX4 playing on cell phone NAD noted

## 2014-06-12 NOTE — ED Notes (Signed)
Patient was brought to ED by EMS after she called a friend and told her she was bleeding to death.  She has been drinking alcohol for 3-4 days (history of alcoholism) and fell this evening.  She has a laceration to left side of her forehead.

## 2014-06-12 NOTE — ED Notes (Signed)
Bed: [facility] Expected date:  Expected time:  Means of arrival:  Comments: EMS 59 yo female intoxicated/lac to forehead

## 2014-06-12 NOTE — ED Provider Notes (Signed)
59 year old female presents with acute alcohol intoxication syncope and a laceration to her forehead. Patient was signed out to me by Junius Creamer NP at shift change his morning. Patient was treated for her acute injuries including Steri-Strips on her forehead, and allowed to rest through the evening until clinically sober. This morning patient woke up she reported that she was feeling better had some lateral muscular neck pain no C-spine T-spine and L-spine tenderness. She denied headache, changes in vision, or any neurological deficits. She reported to me that she is a alcoholic and drinks a bottle one per day. She reports that she is drinking much alcohol previously and had to be admitted. She assured me that she has adequate follow-up and [facility] with substance abuse counseling. At time of discharge patient was tachycardic at 122 with very minor tremors. It seemed that she was smiling early signs of withdrawal. Patient was given 1 g of Ativan here with a drop in her pulse to 104 respiration 17 pressure remained normal. Patient reports that previous episodes have been uncomplicated with the use of Ativan at home, she denies seizures, or tears complications of withdrawal. She reports that she will follow-up with her abuse counseling, use Ativan as needed, monitor for worsening signs or symptoms and return immediately if any present. Patient was offered the option of further observation including Ativan and fluids which she refused and stated she could manage this at home. She was given Ativan 0.5 mg to be used 3 times a day as needed for 3 days and instructed to avoid alcohol, driving and follow-up immediately if her condition change. Patient understood and agreed to the plan and was clinically sober and ambulated out of the ED without difficulty.  Okey Regal, PA-C 06/12/14 [place], MD 06/13/14 [phone]

## 2014-06-13 ENCOUNTER — Other Ambulatory Visit: Payer: Self-pay | Admitting: Family Medicine

## 2014-07-08 ENCOUNTER — Emergency Department (HOSPITAL_COMMUNITY)
Admission: EM | Admit: 2014-07-08 | Discharge: 2014-07-08 | Disposition: A | Discharge status: Home / Self Care | Payer: Federal, State, Local not specified - PPO | Attending: Emergency Medicine | Admitting: Emergency Medicine

## 2014-07-08 ENCOUNTER — Encounter (HOSPITAL_COMMUNITY): Payer: Self-pay | Admitting: Emergency Medicine

## 2014-07-08 DIAGNOSIS — Z79899 Other long term (current) drug therapy: Secondary | ICD-10-CM | POA: Diagnosis not present

## 2014-07-08 DIAGNOSIS — F419 Anxiety disorder, unspecified: Secondary | ICD-10-CM | POA: Insufficient documentation

## 2014-07-08 DIAGNOSIS — F10930 Alcohol use, unspecified with withdrawal, uncomplicated: Secondary | ICD-10-CM

## 2014-07-08 DIAGNOSIS — G43909 Migraine, unspecified, not intractable, without status migrainosus: Secondary | ICD-10-CM | POA: Insufficient documentation

## 2014-07-08 DIAGNOSIS — Z8639 Personal history of other endocrine, nutritional and metabolic disease: Secondary | ICD-10-CM | POA: Diagnosis not present

## 2014-07-08 DIAGNOSIS — Z8719 Personal history of other diseases of the digestive system: Secondary | ICD-10-CM | POA: Diagnosis not present

## 2014-07-08 DIAGNOSIS — F10230 Alcohol dependence with withdrawal, uncomplicated: Secondary | ICD-10-CM | POA: Insufficient documentation

## 2014-07-08 DIAGNOSIS — Z8739 Personal history of other diseases of the musculoskeletal system and connective tissue: Secondary | ICD-10-CM | POA: Diagnosis not present

## 2014-07-08 DIAGNOSIS — F1010 Alcohol abuse, uncomplicated: Secondary | ICD-10-CM | POA: Diagnosis present

## 2014-07-08 LAB — CBC
HCT: 40.5 % (ref 36.0–46.0)
HEMOGLOBIN: 13.3 g/dL (ref 12.0–15.0)
MCH: 33.3 pg (ref 26.0–34.0)
MCHC: 32.8 g/dL (ref 30.0–36.0)
MCV: 101.3 fL — AB (ref 78.0–100.0)
Platelets: 153 10*3/uL (ref 150–400)
RBC: 4 MIL/uL (ref 3.87–5.11)
RDW: 12.7 % (ref 11.5–15.5)
WBC: 5.5 10*3/uL (ref 4.0–10.5)

## 2014-07-08 LAB — COMPREHENSIVE METABOLIC PANEL
ALK PHOS: 146 U/L — AB (ref 38–126)
ALT: 60 U/L — ABNORMAL HIGH (ref 14–54)
ANION GAP: 15 (ref 5–15)
AST: 323 U/L — ABNORMAL HIGH (ref 15–41)
Albumin: 4.4 g/dL (ref 3.5–5.0)
BILIRUBIN TOTAL: 0.7 mg/dL (ref 0.3–1.2)
BUN: 13 mg/dL (ref 6–20)
CHLORIDE: 99 mmol/L — AB (ref 101–111)
CO2: 25 mmol/L (ref 22–32)
CREATININE: 0.58 mg/dL (ref 0.44–1.00)
Calcium: 9.5 mg/dL (ref 8.9–10.3)
GLUCOSE: 127 mg/dL — AB (ref 70–99)
POTASSIUM: 4.2 mmol/L (ref 3.5–5.1)
Sodium: 139 mmol/L (ref 135–145)
Total Protein: 8.1 g/dL (ref 6.5–8.1)

## 2014-07-08 MED ORDER — CHLORDIAZEPOXIDE HCL 25 MG PO CAPS
25.0000 mg | ORAL_CAPSULE | Freq: Once | ORAL | Status: AC
  Administered 2014-07-08: 25 mg via ORAL
  Filled 2014-07-08: qty 1

## 2014-07-08 MED ORDER — CHLORDIAZEPOXIDE HCL 25 MG PO CAPS: ORAL_CAPSULE | ORAL | Status: DC

## 2014-07-08 NOTE — ED Provider Notes (Signed)
CSN: [id number]     Arrival date & time 07/08/14  1408 History   First MD Initiated Contact with Patient 07/08/14 1605     Chief Complaint  Patient presents with  . Withdrawal    Alcohol     (Consider location/radiation/quality/duration/timing/severity/associated sxs/prior Treatment) HPI Comments: Patient here complaining of tremors and again this morning. Patient is currently an outpatient detox program but binge drink this weekend. Her last use of alcohol was this morning. Denies any hallucinations. Symptoms were made better after she drank alcohol. No suicidal homicidal ideations. Denies any confusion at this time. Called the facility was told to come here to pursue treatment for alcohol withdrawal. Denies any recent seizures  The history is provided by the patient.    Past Medical History  Diagnosis Date  . Low back pain   . Anxiety   . Migraines   . GOITER, MULTINODULAR 08/17/2009  . OSTEOPOROSIS 08/17/2009  . ASYMPTOMATIC POSTMENOPAUSAL STATUS 08/17/2009  . DYSPHAGIA 06/19/2007  . Supraventricular tachycardia   . Hyperlipidemia   . Mitral valve prolapse   . PONV (postoperative nausea and vomiting)   . Fatty liver, alcoholic   . Alcoholism    Past Surgical History  Procedure Laterality Date  . Laporoscopic abdominal surgery    . Cervix surgery    . Cholecystectomy N/A 03/14/2013    Procedure: LAPAROSCOPIC CHOLECYSTECTOMY WITH ATTEMPTED INTRAOPERATIVE CHOLANGIOGRAM;  Surgeon: Harl Bowie, MD;  Location: [place];  Service: General;  Laterality: N/A;   Family History  Problem Relation Age of Onset  . Thyroid disease Neg Hx   . Goiter Neg Hx   . Colon cancer Father     Deceased, 63  . Osteoporosis Mother     Living, 69  . Healthy Brother    History  Substance Use Topics  . Smoking status: Never Smoker   . Smokeless tobacco: Never Used  . Alcohol Use: Yes     Comment: bottle of wine daily   OB History    No data available     Review of Systems  All other  systems reviewed and are negative.     Allergies  Codeine; Doxycycline; and Tetracycline hcl  Home Medications   Prior to Admission medications   Medication Sig Start Date End Date Taking? Authorizing Provider  chlordiazePOXIDE (LIBRIUM) 25 MG capsule 50mg  PO TID x 1D, then 25-50mg  PO BID X 1D, then 25-50mg  PO QD X 1D 07/08/14   Lacretia Leigh, MD  diltiazem (CARDIZEM CD) 240 MG 24 hr capsule TAKE 1 CAPSULE (240 MG TOTAL) BY MOUTH DAILY. 06/16/14   Denita Lung, MD  DULoxetine (CYMBALTA) 60 MG capsule Take 1 capsule (60 mg total) by mouth daily. 04/24/14   Denita Lung, MD  LORazepam (ATIVAN) 0.5 MG tablet Take 1 tablet (0.5 mg total) by mouth every 8 (eight) hours as needed for anxiety. 06/12/14   Okey Regal, PA-C  norethindrone-ethinyl estradiol (FEMHRT 1/5) 1-5 MG-MCG TABS Take 1 tablet by mouth daily. For Osteoporosis 04/07/14   Encarnacion Slates, NP  pantoprazole (PROTONIX) 40 MG tablet TAKE 1 TABLET (40 MG TOTAL) BY MOUTH DAILY. 04/10/14   Denita Lung, MD  SUMAtriptan (IMITREX) 100 MG tablet TAKE 1 TABLET BY MOUTH EVERY 2 HOURS AS NEEDED 04/10/14   Denita Lung, MD  SUMAtriptan (IMITREX) 100 MG tablet TAKE 1 TABLET BY MOUTH EVERY 2 HOURS AS NEEDED Patient not taking: Reported on 06/12/2014 06/11/14   Denita Lung, MD  zolpidem [facility]) 5  MG tablet Take 1 tablet (5 mg total) by mouth at bedtime as needed for sleep. Patient not taking: Reported on 04/24/2014 04/07/14   Encarnacion Slates, NP   BP 129/82 mmHg  Pulse 108  Temp(Src) 98.7 F (37.1 C) (Oral)  Resp 18  SpO2 100% Physical Exam  Constitutional: She is oriented to person, place, and time. She appears well-developed and well-nourished.  Non-toxic appearance. No distress.  HENT:  Head: Normocephalic and atraumatic.  Eyes: Conjunctivae, EOM and lids are normal. Pupils are equal, round, and reactive to light.  Neck: Normal range of motion. Neck supple. No tracheal deviation present. No thyroid mass present.  Cardiovascular: Normal  rate, regular rhythm and normal heart sounds.  Exam reveals no gallop.   No murmur heard. Pulmonary/Chest: Effort normal and breath sounds normal. No stridor. No respiratory distress. She has no decreased breath sounds. She has no wheezes. She has no rhonchi. She has no rales.  Abdominal: Soft. Normal appearance and bowel sounds are normal. She exhibits no distension. There is no tenderness. There is no rebound and no CVA tenderness.  Musculoskeletal: Normal range of motion. She exhibits no edema or tenderness.  Neurological: She is alert and oriented to person, place, and time. She has normal strength. She displays tremor. No cranial nerve deficit or sensory deficit. GCS eye subscore is 4. GCS verbal subscore is 5. GCS motor subscore is 6.  Skin: Skin is warm and dry. No abrasion and no rash noted.  Psychiatric: She has a normal mood and affect. Her speech is normal and behavior is normal.  Nursing note and vitals reviewed.   ED Course  Procedures (including critical care time) Labs Review Labs Reviewed  CBC - Abnormal; Notable for the following:    MCV 101.3 (*)    All other components within normal limits  COMPREHENSIVE METABOLIC PANEL - Abnormal; Notable for the following:    Chloride 99 (*)    Glucose, Bld 127 (*)    AST 323 (*)    ALT 60 (*)    Alkaline Phosphatase 146 (*)    All other components within normal limits    Imaging Review No results found.   EKG Interpretation None      MDM   Final diagnoses:  Alcohol withdrawal, uncomplicated    Patient has a slight tremor at this time. Was given Librium 25 mg by mouth. He'll be given Librium taper and instructed to abstain from alcohol use and continue her outpatient treatment for alcohol abuse.  Lacretia Leigh, MD 07/08/14 [phone]

## 2014-07-08 NOTE — ED Notes (Signed)
Pt states she feels like her heart has been fluttering from alcohol withdrawal. She states her last drink was last night. Says she's in an outpatient program out of [place], she called there and told her to go to the ER.

## 2014-07-08 NOTE — Discharge Instructions (Signed)
Alcohol Withdrawal °Anytime drug use is interfering with normal living activities it has become abuse. This includes problems with family and friends. Psychological dependence has developed when your mind tells you that the drug is needed. This is usually followed by physical dependence when a continuing increase of drugs are required to get the same feeling or "high." This is known as addiction or chemical dependency. A person's risk is much higher if there is a history of chemical dependency in the family. °Mild Withdrawal Following Stopping Alcohol, When Addiction or Chemical Dependency Has Developed °When a person has developed tolerance to alcohol, any sudden stopping of alcohol can cause uncomfortable physical symptoms. Most of the time these are mild and consist of tremors in the hands and increases in heart rate, breathing, and temperature. Sometimes these symptoms are associated with anxiety, panic attacks, and bad dreams. There may also be stomach upset. Normal sleep patterns are often interrupted with periods of inability to sleep (insomnia). This may last for 6 months. Because of this discomfort, many people choose to continue drinking to get rid of this discomfort and to try to feel normal. °Severe Withdrawal with Decreased or No Alcohol Intake, When Addiction or Chemical Dependency Has Developed °About five percent of alcoholics will develop signs of severe withdrawal when they stop using alcohol. One sign of this is development of generalized seizures (convulsions). Other signs of this are severe agitation and confusion. This may be associated with believing in things which are not real or seeing things which are not really there (delusions and hallucinations). Vitamin deficiencies are usually present if alcohol intake has been long-term. Treatment for this most often requires hospitalization and close observation. °Addiction can only be helped by stopping use of all chemicals. This is hard but may  save your life. With continual alcohol use, possible outcomes are usually loss of self respect and esteem, violence, and death. °Addiction cannot be cured but it can be stopped. This often requires outside help and the care of professionals. Treatment centers are listed in the yellow pages under Cocaine, Narcotics, and Alcoholics Anonymous. Most hospitals and clinics can refer you to a specialized care center. °It is not necessary for you to go through the uncomfortable symptoms of withdrawal. Your caregiver can provide you with medicines that will help you through this difficult period. Try to avoid situations, friends, or drugs that made it possible for you to keep using alcohol in the past. Learn how to say no. °It takes a long period of time to overcome addictions to all drugs, including alcohol. There may be many times when you feel as though you want a drink. After getting rid of the physical addiction and withdrawal, you will have a lessening of the craving which tells you that you need alcohol to feel normal. Call your caregiver if more support is needed. Learn who to talk to in your family and among your friends so that during these periods you can receive outside help. Alcoholics Anonymous (AA) has helped many people over the years. To get further help, contact AA or call your caregiver, counselor, or clergyperson. Al-Anon and Alateen are support groups for friends and family members of an alcoholic. The people who love and care for an alcoholic often need help, too. For information about these organizations, check your phone directory or call a local alcoholism treatment center.  °SEEK IMMEDIATE MEDICAL CARE IF:  °· You have a seizure. °· You have a fever. °· You experience uncontrolled vomiting or you   vomit up blood. This may be bright red or look like black coffee grounds. °· You have blood in the stool. This may be bright red or appear as a black, tarry, bad-smelling stool. °· You become lightheaded or  faint. Do not drive if you feel this way. Have someone else drive you or call 911 for help. °· You become more agitated or confused. °· You develop uncontrolled anxiety. °· You begin to see things that are not really there (hallucinate). °Your caregiver has determined that you completely understand your medical condition, and that your mental state is back to normal. You understand that you have been treated for alcohol withdrawal, have agreed not to drink any alcohol for a minimum of 1 day, will not operate a car or other machinery for 24 hours, and have had an opportunity to ask any questions about your condition. °Document Released: 11/24/2004 Document Revised: 05/09/2011 Document Reviewed: 10/03/2007 °Exit[employer]® Patient Information ©2015 [employer]. This information is not intended to replace advice given to you by your health care provider. Make sure you discuss any questions you have with your health care provider. ° °

## 2014-10-22 ENCOUNTER — Other Ambulatory Visit: Payer: Self-pay | Admitting: Family Medicine

## 2014-10-22 NOTE — Telephone Encounter (Signed)
Is this okay to refill? 

## 2014-10-23 NOTE — Telephone Encounter (Signed)
Is this okay to refill? 

## 2014-10-23 NOTE — Telephone Encounter (Signed)
She needs an appointment.

## 2014-12-04 ENCOUNTER — Other Ambulatory Visit: Payer: Self-pay | Admitting: Family Medicine

## 2014-12-04 NOTE — Telephone Encounter (Signed)
IS THIS OKAY 

## 2014-12-12 ENCOUNTER — Other Ambulatory Visit: Payer: Self-pay

## 2014-12-12 DIAGNOSIS — Z1231 Encounter for screening mammogram for malignant neoplasm of breast: Secondary | ICD-10-CM

## 2014-12-15 ENCOUNTER — Telehealth: Payer: Self-pay | Admitting: Family Medicine

## 2014-12-15 NOTE — Telephone Encounter (Signed)
Pt's called back and stated she had left the incorrect phone number. Correct phone number is [phone].

## 2014-12-15 NOTE — Telephone Encounter (Signed)
Patient wants shingles vaccine, would like Rx sent to [facility]

## 2014-12-16 NOTE — Telephone Encounter (Signed)
Take care of this 

## 2014-12-16 NOTE — Telephone Encounter (Signed)
Left message for pt that rx is up front for her

## 2014-12-31 ENCOUNTER — Encounter: Payer: Self-pay | Admitting: Family Medicine

## 2014-12-31 ENCOUNTER — Ambulatory Visit (INDEPENDENT_AMBULATORY_CARE_PROVIDER_SITE_OTHER): Payer: Federal, State, Local not specified - PPO | Admitting: Family Medicine

## 2014-12-31 VITALS — BP 122/72 | HR 72 | Temp 98.3°F | Wt 112.2 lb

## 2014-12-31 DIAGNOSIS — J3089 Other allergic rhinitis: Secondary | ICD-10-CM

## 2014-12-31 DIAGNOSIS — G43011 Migraine without aura, intractable, with status migrainosus: Secondary | ICD-10-CM

## 2014-12-31 DIAGNOSIS — J019 Acute sinusitis, unspecified: Secondary | ICD-10-CM

## 2014-12-31 MED ORDER — AMOXICILLIN 875 MG PO TABS: 875.0000 mg | ORAL_TABLET | Freq: Two times a day (BID) | ORAL | Status: DC

## 2014-12-31 MED ORDER — KETOROLAC TROMETHAMINE 60 MG/2ML IM SOLN
60.0000 mg | Freq: Once | INTRAMUSCULAR | Status: AC
  Administered 2014-12-31: 60 mg via INTRAMUSCULAR

## 2014-12-31 NOTE — Progress Notes (Signed)
Subjective:    Patient ID: Terri Ayala, female    DOB: [birth year], 59 y.o.   MRN: [id number]  HPI Chief Complaint  Patient presents with  . headache    headache, chills, pain in neck that shoots in head. no fever. having issues with allergies.  taken OTC meds but nothing is working. achy    She is here with complaints of daily headache last Thursday, her "typical" migraine on the left side of her head, so she took Imitrex, 1 dose Also reports associated photophobia an phonophobia. States migraine returned on Friday and she took more Imitrex, 2 doses. 3 days in a row she reports having headaches and using Imitrex. She also states she has been issues with her allergies over past couple of months, particularly in morning. Reports significant nasal drainage and ears feel "full", took 1 dose of antihistamine without relief. States headache is worse when lying down and feels some better when upright. Reports pain currently 5/10 and goes up to 8/10.   Reports pain in neck that feels tight that shoots up into head. Pain in bilateral neck is constant and worse with movement, particularly with flexion. She states she tried Tylenol and Advil (2 pills) without relief but only tried this one time. States she felt like she had the flu, felt feverish but took temperature and negative for fever.  Reports getting a shingles vaccine 2 weeks ago. Got the flu shot.  Questions about getting the pneumonia shot, states she had walking pneumonia last year.  Denies fever, chills, dizziness, syncope, nausea, vomiting, numbness, tingling, weakness.   Is seeing a psychiatrist for alcohol addiction, anxiety, depression. She was in [facility] for alcohol addiction 4 months ago and is still going as outpatient, and going to [facility].  She denies having any alcohol.   Reviewed allergies, medications, past medical and social history.   Review of Systems Pertinent positives and negatives in the history of  present illness.     Objective:   Physical Exam  Constitutional: She is oriented to person, place, and time. She appears well-developed and well-nourished. No distress.  HENT:  Head: Normocephalic.  Right Ear: Ear canal normal. A middle ear effusion is present.  Left Ear: Tympanic membrane and ear canal normal.  Nose: Mucosal edema present. Right sinus exhibits maxillary sinus tenderness. Right sinus exhibits no frontal sinus tenderness. Left sinus exhibits maxillary sinus tenderness. Left sinus exhibits no frontal sinus tenderness.  Mouth/Throat: Uvula is midline and oropharynx is clear and moist. Mucous membranes are dry. No oropharyngeal exudate, posterior oropharyngeal edema or posterior oropharyngeal erythema.  Neck: Neck supple. No spinous process tenderness and no muscular tenderness present. Decreased range of motion present. No erythema present.  Full passive ROM with pain  Cardiovascular: Normal rate, regular rhythm, normal heart sounds and normal pulses.   Pulmonary/Chest: Effort normal and breath sounds normal. She has no wheezes. She has no rhonchi.  Lymphadenopathy:       Head (right side): No tonsillar and no occipital adenopathy present.       Head (left side): No tonsillar and no occipital adenopathy present.    She has no cervical adenopathy.       Right: No supraclavicular adenopathy present.       Left: No supraclavicular adenopathy present.  Neurological: She is alert and oriented to person, place, and time. She has normal strength. No cranial nerve deficit or sensory deficit. She displays a negative Romberg sign. Gait normal.  Skin: Skin  is warm and dry. No cyanosis. No pallor. Nails show no clubbing.  Psychiatric: She has a normal mood and affect. Her speech is normal and behavior is normal. Thought content normal.   BP 122/72 mmHg  Pulse 72  Temp(Src) 98.3 F (36.8 C) (Tympanic)  Wt 112 lb 3.2 oz (50.894 kg)     Assessment & Plan:  Intractable migraine without  aura and with status migrainosus - Plan: ketorolac (TORADOL) injection 60 mg  Acute sinusitis, unspecified  Other allergic rhinitis  Discussed that multiple etiologies may be contributing to her persistent headache and neck ache. She appears to have significant postnasal drainage related to allergies and most likely a sinus infection. Discussed that her neurologic exam is normal. Recommend treating allergies and taking daily anti-inflammatories with food for the next 3 days and see if this gives her relief. She received an injection of Toradol 60 mg in the office. Discussed staying well hydrated, she appears dry today. She will let me know if she is not back to her baseline after completing the antibiotic. Congratulated her on stopping drinking and going to [place].

## 2014-12-31 NOTE — Patient Instructions (Addendum)
You received an injection in the office called Toradol, an anti-inflammatory.  Starting tomorrow: Naproxen or Aleve 1-2 pills every 12 hours with food for next 2-3 days. Claritin or Zyrtec orally or flonase nasal spray. Stay well hydrated.  Let me know if not back to normal after taking the antibiotic. Also, for your neck try using heat 20 minutes at a time and then stretching like we talked about in the office.    Sinusitis, Adult Sinusitis is redness, soreness, and inflammation of the paranasal sinuses. Paranasal sinuses are air pockets within the bones of your face. They are located beneath your eyes, in the middle of your forehead, and above your eyes. In healthy paranasal sinuses, mucus is able to drain out, and air is able to circulate through them by way of your nose. However, when your paranasal sinuses are inflamed, mucus and air can become trapped. This can allow bacteria and other germs to grow and cause infection. Sinusitis can develop quickly and last only a short time (acute) or continue over a long period (chronic). Sinusitis that lasts for more than 12 weeks is considered chronic. CAUSES Causes of sinusitis include:  Allergies.  Structural abnormalities, such as displacement of the cartilage that separates your nostrils (deviated septum), which can decrease the air flow through your nose and sinuses and affect sinus drainage.  Functional abnormalities, such as when the small hairs (cilia) that line your sinuses and help remove mucus do not work properly or are not present. SIGNS AND SYMPTOMS Symptoms of acute and chronic sinusitis are the same. The primary symptoms are pain and pressure around the affected sinuses. Other symptoms include:  Upper toothache.  Earache.  Headache.  Bad breath.  Decreased sense of smell and taste.  A cough, which worsens when you are lying flat.  Fatigue.  Fever.  Thick drainage from your nose, which often is green and may contain pus  (purulent).  Swelling and warmth over the affected sinuses. DIAGNOSIS Your health care provider will perform a physical exam. During your exam, your health care provider may perform any of the following to help determine if you have acute sinusitis or chronic sinusitis:  Look in your nose for signs of abnormal growths in your nostrils (nasal polyps).  Tap over the affected sinus to check for signs of infection.  View the inside of your sinuses using an imaging device that has a light attached (endoscope). If your health care provider suspects that you have chronic sinusitis, one or more of the following tests may be recommended:  Allergy tests.  Nasal culture. A sample of mucus is taken from your nose, sent to a lab, and screened for bacteria.  Nasal cytology. A sample of mucus is taken from your nose and examined by your health care provider to determine if your sinusitis is related to an allergy. TREATMENT Most cases of acute sinusitis are related to a viral infection and will resolve on their own within 10 days. Sometimes, medicines are prescribed to help relieve symptoms of both acute and chronic sinusitis. These may include pain medicines, decongestants, nasal steroid sprays, or saline sprays. However, for sinusitis related to a bacterial infection, your health care provider will prescribe antibiotic medicines. These are medicines that will help kill the bacteria causing the infection. Rarely, sinusitis is caused by a fungal infection. In these cases, your health care provider will prescribe antifungal medicine. For some cases of chronic sinusitis, surgery is needed. Generally, these are cases in which sinusitis recurs more  than 3 times per year, despite other treatments. HOME CARE INSTRUCTIONS  Drink plenty of water. Water helps thin the mucus so your sinuses can drain more easily.  Use a humidifier.  Inhale steam 3-4 times a day (for example, sit in the bathroom with the shower  running).  Apply a warm, moist washcloth to your face 3-4 times a day, or as directed by your health care provider.  Use saline nasal sprays to help moisten and clean your sinuses.  Take medicines only as directed by your health care provider.  If you were prescribed either an antibiotic or antifungal medicine, finish it all even if you start to feel better. SEEK IMMEDIATE MEDICAL CARE IF:  You have increasing pain or severe headaches.  You have nausea, vomiting, or drowsiness.  You have swelling around your face.  You have vision problems.  You have a stiff neck.  You have difficulty breathing.   This information is not intended to replace advice given to you by your health care provider. Make sure you discuss any questions you have with your health care provider.   Document Released: 02/14/2005 Document Revised: 03/07/2014 Document Reviewed: 03/01/2011 Elsevier Interactive Patient Education [employer].

## 2015-01-12 ENCOUNTER — Ambulatory Visit
Admission: RE | Admit: 2015-01-12 | Discharge: 2015-01-12 | Disposition: A | Discharge status: Home / Self Care | Payer: Federal, State, Local not specified - PPO | Source: Ambulatory Visit

## 2015-01-12 DIAGNOSIS — Z1231 Encounter for screening mammogram for malignant neoplasm of breast: Secondary | ICD-10-CM

## 2015-01-14 ENCOUNTER — Other Ambulatory Visit: Payer: Self-pay | Admitting: Obstetrics and Gynecology

## 2015-01-14 DIAGNOSIS — R928 Other abnormal and inconclusive findings on diagnostic imaging of breast: Secondary | ICD-10-CM

## 2015-01-21 ENCOUNTER — Ambulatory Visit
Admission: RE | Admit: 2015-01-21 | Discharge: 2015-01-21 | Disposition: A | Discharge status: Home / Self Care | Payer: Federal, State, Local not specified - PPO | Source: Ambulatory Visit | Attending: Obstetrics and Gynecology | Admitting: Obstetrics and Gynecology

## 2015-01-21 ENCOUNTER — Other Ambulatory Visit: Payer: Self-pay | Admitting: Obstetrics and Gynecology

## 2015-01-21 DIAGNOSIS — R928 Other abnormal and inconclusive findings on diagnostic imaging of breast: Secondary | ICD-10-CM

## 2015-01-29 ENCOUNTER — Other Ambulatory Visit: Payer: Self-pay | Admitting: Obstetrics and Gynecology

## 2015-01-29 ENCOUNTER — Ambulatory Visit
Admission: RE | Admit: 2015-01-29 | Discharge: 2015-01-29 | Disposition: A | Discharge status: Home / Self Care | Payer: Federal, State, Local not specified - PPO | Source: Ambulatory Visit | Attending: Obstetrics and Gynecology | Admitting: Obstetrics and Gynecology

## 2015-01-29 DIAGNOSIS — R928 Other abnormal and inconclusive findings on diagnostic imaging of breast: Secondary | ICD-10-CM

## 2015-02-06 ENCOUNTER — Encounter: Payer: Self-pay | Admitting: Family Medicine

## 2015-02-08 ENCOUNTER — Other Ambulatory Visit: Payer: Self-pay | Admitting: Family Medicine

## 2015-02-09 NOTE — Telephone Encounter (Signed)
Is this okay to refill? 

## 2015-02-09 NOTE — Telephone Encounter (Signed)
Dr. Redmond School treats her for chronic headaches, please send this request to him. Thanks.

## 2015-02-12 ENCOUNTER — Other Ambulatory Visit: Payer: Self-pay | Admitting: Obstetrics and Gynecology

## 2015-02-13 LAB — CYTOLOGY - PAP

## 2015-03-13 LAB — HM COLONOSCOPY

## 2015-04-06 ENCOUNTER — Encounter: Payer: Self-pay | Admitting: Gastroenterology

## 2015-05-22 ENCOUNTER — Other Ambulatory Visit: Payer: Self-pay | Admitting: Family Medicine

## 2015-05-22 NOTE — Telephone Encounter (Signed)
Is this okay to refill? 

## 2015-08-24 ENCOUNTER — Other Ambulatory Visit: Payer: Self-pay | Admitting: Family Medicine

## 2015-08-24 DIAGNOSIS — L719 Rosacea, unspecified: Secondary | ICD-10-CM | POA: Diagnosis not present

## 2015-08-24 DIAGNOSIS — Z86018 Personal history of other benign neoplasm: Secondary | ICD-10-CM | POA: Diagnosis not present

## 2015-08-24 DIAGNOSIS — L821 Other seborrheic keratosis: Secondary | ICD-10-CM | POA: Diagnosis not present

## 2015-08-24 DIAGNOSIS — D225 Melanocytic nevi of trunk: Secondary | ICD-10-CM | POA: Diagnosis not present

## 2015-08-24 NOTE — Telephone Encounter (Signed)
Is this okay to refill? 

## 2015-08-29 ENCOUNTER — Other Ambulatory Visit: Payer: Self-pay | Admitting: Family Medicine

## 2015-08-31 NOTE — Telephone Encounter (Signed)
Is this okay to refill? 

## 2015-08-31 NOTE — Telephone Encounter (Signed)
Filled on 6/26

## 2015-09-04 ENCOUNTER — Other Ambulatory Visit: Payer: Self-pay | Admitting: Family Medicine

## 2015-09-16 ENCOUNTER — Telehealth: Payer: Self-pay | Admitting: Family Medicine

## 2015-09-16 MED ORDER — SUMATRIPTAN SUCCINATE 100 MG PO TABS: ORAL_TABLET | ORAL | Status: DC

## 2015-09-16 NOTE — Telephone Encounter (Signed)
Pt requesting refill on Sumatriptan 100 mg #10. She states she was out of town last month & requested a supply of this med & #5 was requested but she never picked up that med. Pt is now requesting her normal script of #10 because she is completely out of medicine now.

## 2015-10-15 DIAGNOSIS — Z961 Presence of intraocular lens: Secondary | ICD-10-CM | POA: Diagnosis not present

## 2015-10-15 DIAGNOSIS — H04123 Dry eye syndrome of bilateral lacrimal glands: Secondary | ICD-10-CM | POA: Diagnosis not present

## 2015-10-15 DIAGNOSIS — H59031 Cystoid macular edema following cataract surgery, right eye: Secondary | ICD-10-CM | POA: Diagnosis not present

## 2015-10-25 IMAGING — US US ABDOMEN COMPLETE
1 series · 14 of 25 positions shown · non-contrast
Comparison: CT abdomen pelvis dated 01/29/2013

CLINICAL DATA: Abdominal pain

EXAM:
ULTRASOUND ABDOMEN COMPLETE

[Series 1: us abdomen complete · 0.23mm/px · 14 of 69 slices shown]
[im 1/69]
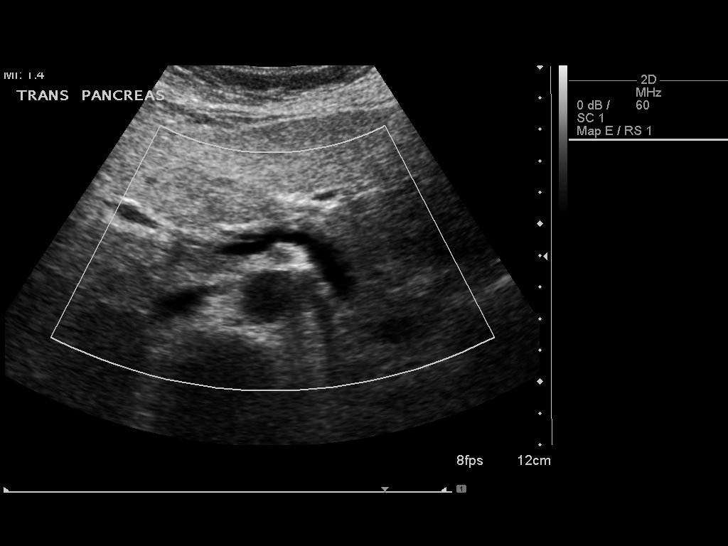
[im 6/69]
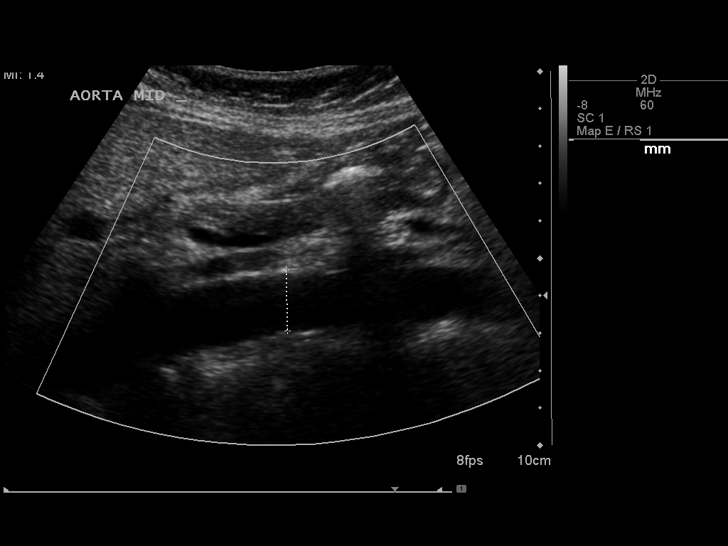
[im 12/69]
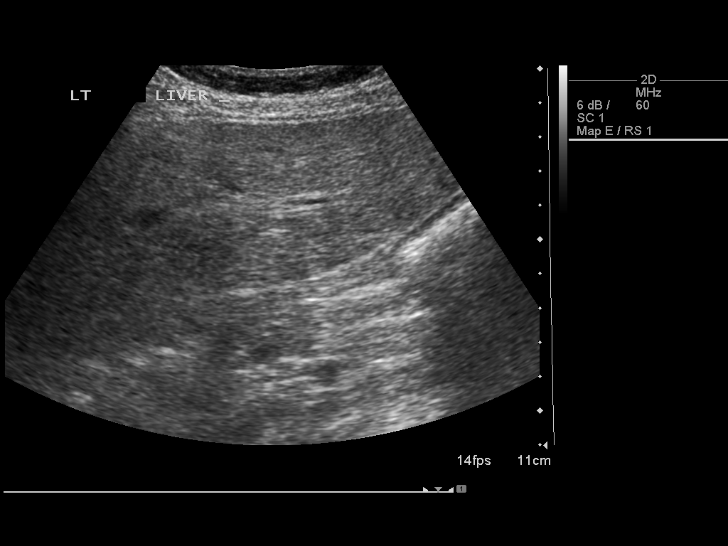
[im 18/69]
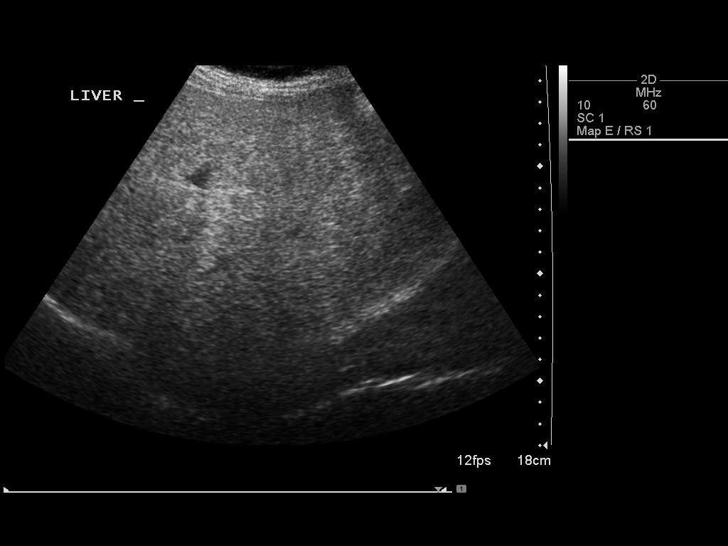
[im 23/69]
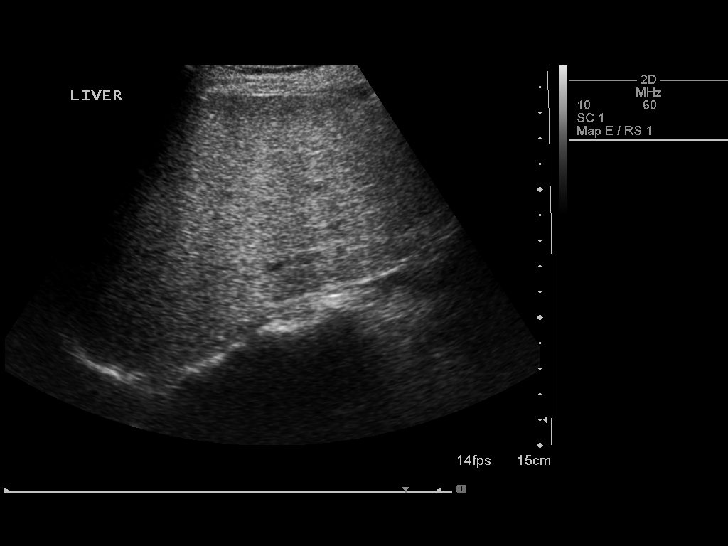
[im 26/69]
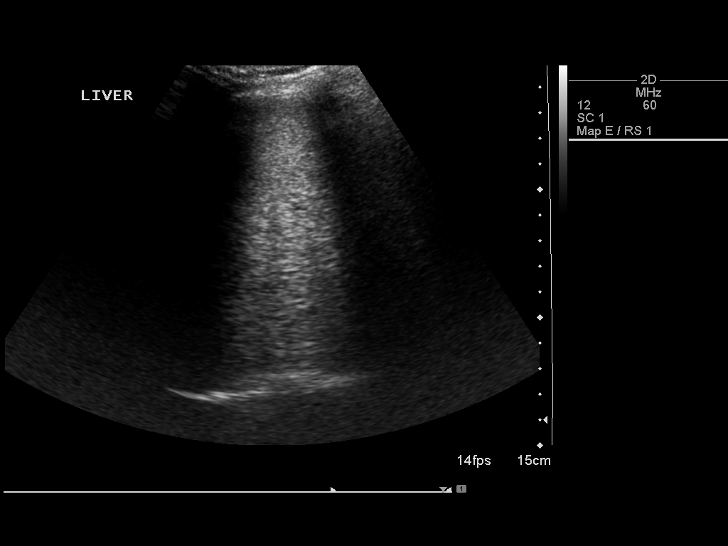
[im 32/69]
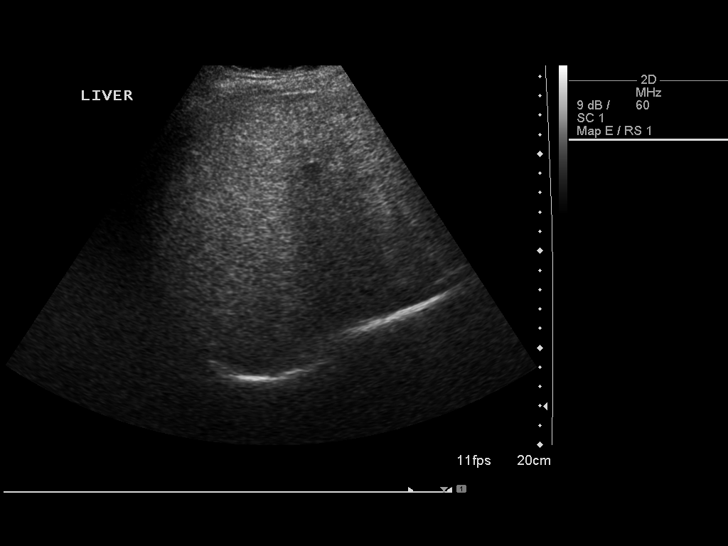
[im 37/69]
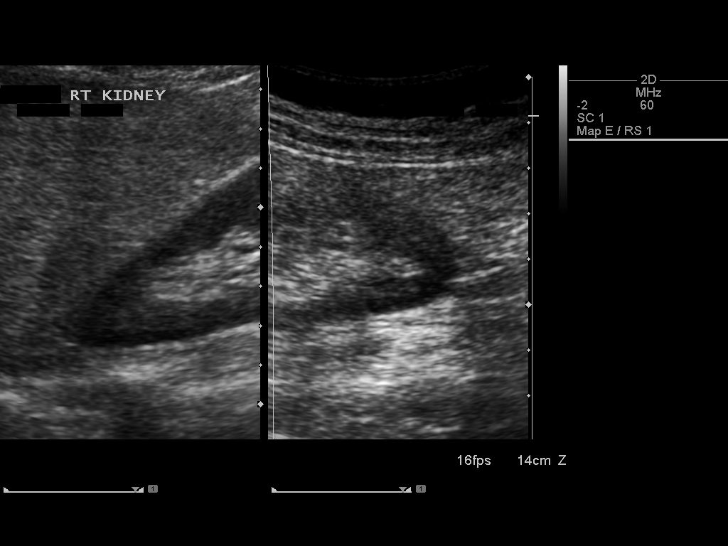
[im 43/69]
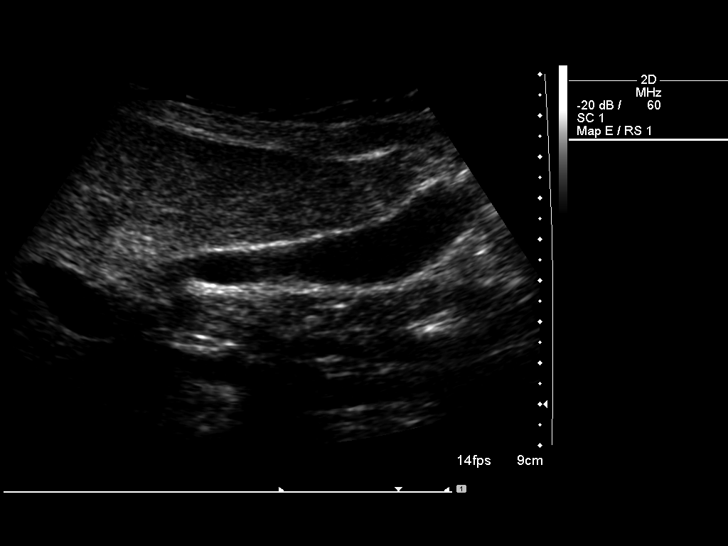
[im 46/69]
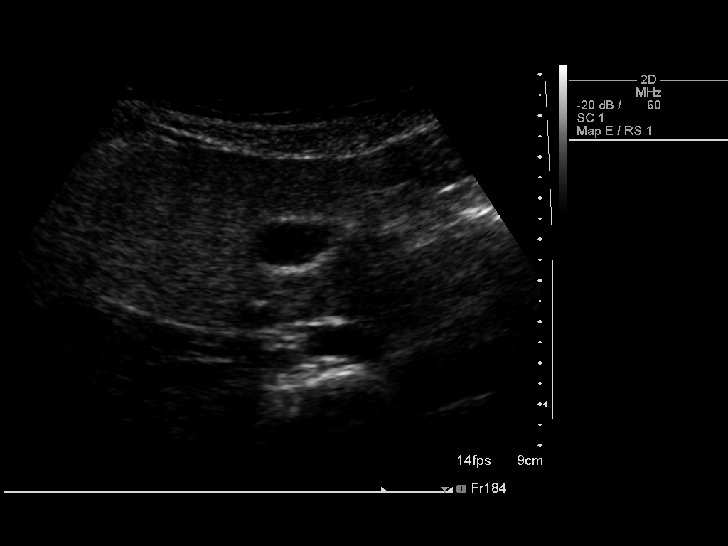
[im 52/69]
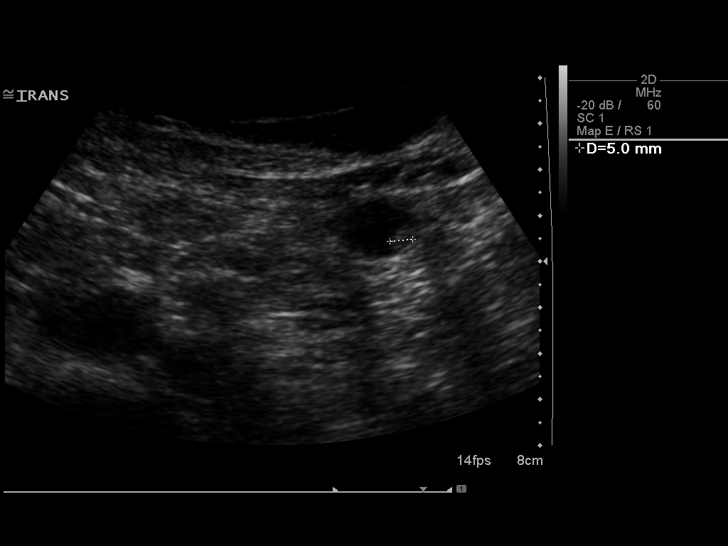
[im 57/69]
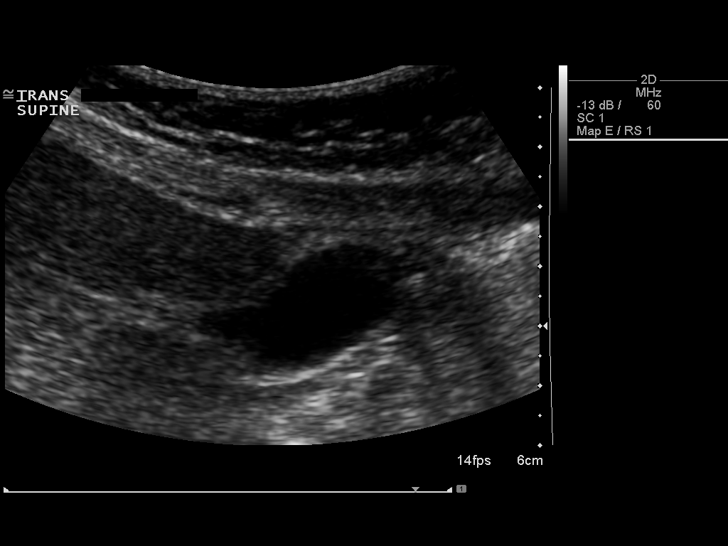
[im 63/69]
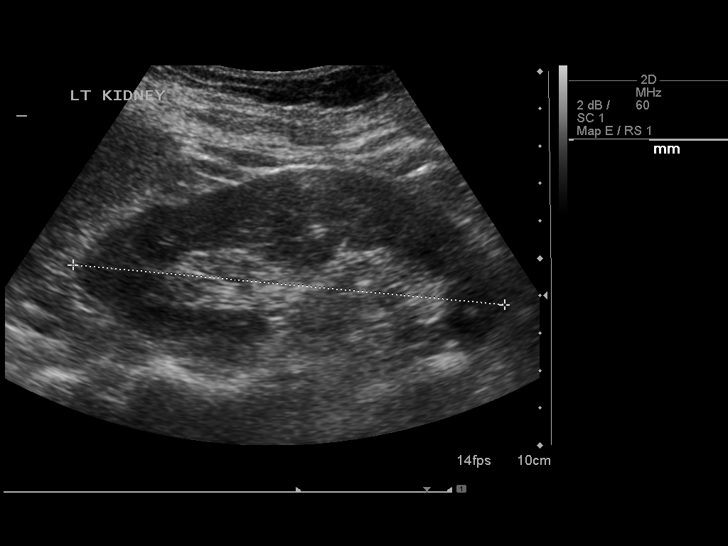
[im 69/69]
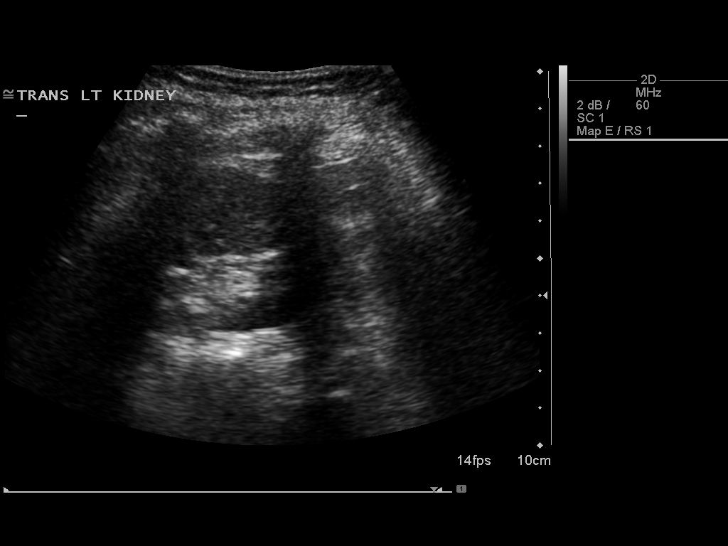

[14 of 25 positions shown; findings below may reference images not displayed]

FINDINGS: Gallbladder:

6 mm polyp in the gallbladder fundus, possibly reflecting
gallbladder adenomyomatosis when correlating with CT. No gallbladder
wall thickening or pericholecystic fluid. Negative sonographic
Murphy's sign.

Common bile duct:

Diameter: 5 mm.

Liver:

Hyperechoic hepatic parenchyma, reflecting hepatic steatosis. No
focal hepatic lesion is seen.

IVC:

No abnormality visualized.

Pancreas:

Poorly visualized due to overlying bowel gas.

Spleen:

Measures 5.1 cm.

Right Kidney:

Length: 11.4 cm.  No mass or hydronephrosis.

Left Kidney:

Length: 11.6 cm.  No mass or hydronephrosis.

Abdominal aorta:

No aneurysm visualized.

Other findings:

None.
IMPRESSION: Hepatic steatosis.

Suspected 6 mm gallbladder polyp in the gallbladder fundus, possibly
reflecting gallbladder adenomyomatosis. No dedicated follow-up
imaging is required given small size.

This recommendation follows ACR consensus guidelines: White Paper of
the ACR Incidental Findings Committee II on Gallbladder and Biliary
Findings. [HOSPITAL] 1145:;[DATE].

## 2015-10-28 IMAGING — NM NM HEPATO W/GB/PHARM/[PERSON_NAME]
1 series · 1 of 1 positions shown · non-contrast
Comparison: Ultrasound, 02/01/2013

RADIOPHARMACEUTICALS:  5mCi Sc-CCm Choletec

CLINICAL DATA: Abdominal pain with nausea and vomiting

EXAM:
NUCLEAR MEDICINE HEPATOBILIARY IMAGING WITH GALLBLADDER EF
TECHNIQUE: Sequential images of the abdomen were obtained [DATE] minutes
following intravenous administration of radiopharmaceutical. After
oral ingestion of Ensure, gallbladder ejection fraction was
determined. At 60 min, normal ejection fraction is greater than 33%.

[hepato · 1 of 1 slices shown]
[im 1/1]
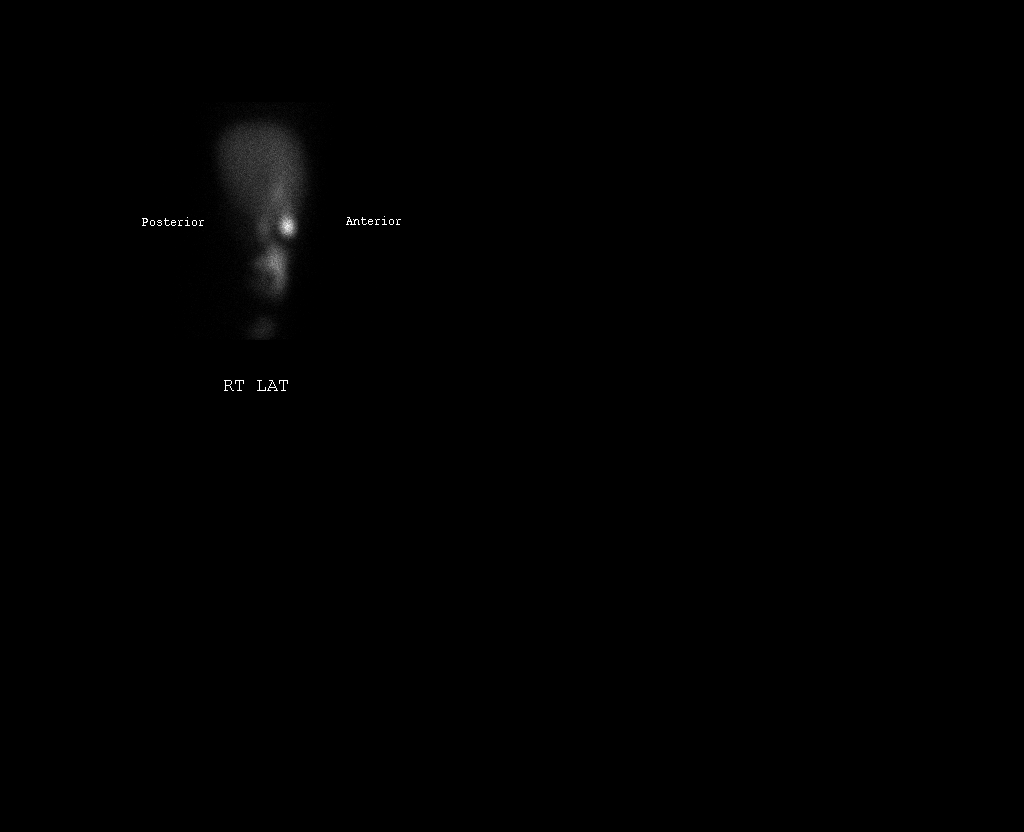

[1 of 1 positions shown; findings below may reference images not displayed]

FINDINGS: Liver shows prompt and homogeneous accumulation of radiotracer.
There is also prompt excretion into the intra and extrahepatic
biliary tree with early gallbladder and small bowel filling
indicating patency of the common bile duct and cystic duct.

Gallbladder ejection fraction: 88 percent. Normal gallbladder
ejection fraction with Ensure is greater than 33%. The patient did
not experience symptoms after oral ingestion of Ensure.
IMPRESSION: Normal hepatobiliary imaging.  Normal gallbladder function.

## 2015-11-26 DIAGNOSIS — H2511 Age-related nuclear cataract, right eye: Secondary | ICD-10-CM | POA: Diagnosis not present

## 2015-11-27 ENCOUNTER — Other Ambulatory Visit: Payer: Self-pay | Admitting: Obstetrics and Gynecology

## 2015-11-27 DIAGNOSIS — Z1231 Encounter for screening mammogram for malignant neoplasm of breast: Secondary | ICD-10-CM

## 2015-11-30 ENCOUNTER — Other Ambulatory Visit: Payer: Self-pay | Admitting: Obstetrics and Gynecology

## 2015-11-30 DIAGNOSIS — E2839 Other primary ovarian failure: Secondary | ICD-10-CM

## 2015-12-30 DIAGNOSIS — H2511 Age-related nuclear cataract, right eye: Secondary | ICD-10-CM | POA: Diagnosis not present

## 2015-12-30 DIAGNOSIS — H25811 Combined forms of age-related cataract, right eye: Secondary | ICD-10-CM | POA: Diagnosis not present

## 2016-01-14 ENCOUNTER — Ambulatory Visit: Payer: Self-pay

## 2016-01-14 ENCOUNTER — Inpatient Hospital Stay: Admission: RE | Admit: 2016-01-14 | Payer: Self-pay | Source: Ambulatory Visit

## 2016-01-27 DIAGNOSIS — F10239 Alcohol dependence with withdrawal, unspecified: Secondary | ICD-10-CM | POA: Insufficient documentation

## 2016-01-27 DIAGNOSIS — F332 Major depressive disorder, recurrent severe without psychotic features: Secondary | ICD-10-CM | POA: Insufficient documentation

## 2016-01-27 DIAGNOSIS — F411 Generalized anxiety disorder: Secondary | ICD-10-CM | POA: Insufficient documentation

## 2016-01-27 DIAGNOSIS — F331 Major depressive disorder, recurrent, moderate: Secondary | ICD-10-CM | POA: Insufficient documentation

## 2016-02-05 ENCOUNTER — Telehealth: Payer: Self-pay | Admitting: Family Medicine

## 2016-02-05 NOTE — Telephone Encounter (Signed)
Requesting that an order be [facility] [place] so that she can get a pneumonia shot

## 2016-02-06 NOTE — Telephone Encounter (Signed)
ok 

## 2016-02-08 ENCOUNTER — Other Ambulatory Visit: Payer: Self-pay

## 2016-02-08 NOTE — Telephone Encounter (Signed)
Faxed over RX for prevnar 13

## 2016-02-19 ENCOUNTER — Ambulatory Visit
Admission: RE | Admit: 2016-02-19 | Discharge: 2016-02-19 | Disposition: A | Discharge status: Home / Self Care | Payer: Federal, State, Local not specified - PPO | Source: Ambulatory Visit | Attending: Obstetrics and Gynecology | Admitting: Obstetrics and Gynecology

## 2016-02-19 DIAGNOSIS — Z1231 Encounter for screening mammogram for malignant neoplasm of breast: Secondary | ICD-10-CM | POA: Diagnosis not present

## 2016-02-19 DIAGNOSIS — M8589 Other specified disorders of bone density and structure, multiple sites: Secondary | ICD-10-CM | POA: Diagnosis not present

## 2016-02-19 DIAGNOSIS — E2839 Other primary ovarian failure: Secondary | ICD-10-CM

## 2016-02-19 DIAGNOSIS — Z780 Asymptomatic menopausal state: Secondary | ICD-10-CM | POA: Diagnosis not present

## 2016-02-25 DIAGNOSIS — F3341 Major depressive disorder, recurrent, in partial remission: Secondary | ICD-10-CM | POA: Diagnosis not present

## 2016-02-26 DIAGNOSIS — F3341 Major depressive disorder, recurrent, in partial remission: Secondary | ICD-10-CM | POA: Diagnosis not present

## 2016-03-01 ENCOUNTER — Other Ambulatory Visit: Payer: Self-pay | Admitting: Obstetrics and Gynecology

## 2016-03-01 DIAGNOSIS — Z6822 Body mass index (BMI) 22.0-22.9, adult: Secondary | ICD-10-CM | POA: Diagnosis not present

## 2016-03-01 DIAGNOSIS — N760 Acute vaginitis: Secondary | ICD-10-CM | POA: Diagnosis not present

## 2016-03-01 DIAGNOSIS — Z01419 Encounter for gynecological examination (general) (routine) without abnormal findings: Secondary | ICD-10-CM | POA: Diagnosis not present

## 2016-06-08 ENCOUNTER — Other Ambulatory Visit: Payer: Self-pay | Admitting: Family Medicine

## 2016-06-08 NOTE — Telephone Encounter (Signed)
Is this okay to refill? 

## 2016-06-17 ENCOUNTER — Encounter: Payer: Self-pay | Admitting: Family Medicine

## 2016-06-17 ENCOUNTER — Ambulatory Visit (INDEPENDENT_AMBULATORY_CARE_PROVIDER_SITE_OTHER): Payer: Federal, State, Local not specified - PPO | Admitting: Family Medicine

## 2016-06-17 ENCOUNTER — Telehealth: Payer: Self-pay | Admitting: Family Medicine

## 2016-06-17 VITALS — BP 130/80 | HR 72 | Ht 62.0 in | Wt 137.0 lb

## 2016-06-17 DIAGNOSIS — F1020 Alcohol dependence, uncomplicated: Secondary | ICD-10-CM | POA: Diagnosis not present

## 2016-06-17 NOTE — Progress Notes (Signed)
   Subjective:    Patient ID: Terri Ayala, female    DOB: 17-Nov-1955, 61 y.o.   MRN: [id number]  HPI She is here for an interval evaluation. Her last visit to this office was November 2016. She has a long history of difficulty with alcohol abuse. She was admitted to 28 day program in July 2016 and it was involved in an outpatient program through [facility] all through November 2017. Has intermittently been going to AA. She had a relapse shortly after that and has been involved with [facility] for outpatient care. Her last visit there was February of this year. She has again had difficulty with a relapse. Her last alcohol consumption was last night. She states that she can drink sometimes 2 bottles of alcohol and then pass out. She has been getting her medications refilled through [facility]. They include Cymbalta, Inderal, BuSpar, as needed trazodone. She did drive here today asking for help.   Review of Systems     Objective:   Physical Exam alert and slightly tremulous but coherent and orientedx3.      Assessment & Plan:  Alcohol use disorder, severe, dependence ([place]) I explained that since she is less than 24 hours since her last week, she is at risk for having alcohol withdrawal problems. I strongly encouraged her to go to [facility] for further evaluation and detox. She then mentioned the fact that she has a dog that needs be taken care of. She gave me verbal contract Mickel [facility] [employer] was also in the room) stating that she would go to her house, get the dog and take it to the vet then go to [facility]. I think she is safe to drive until she gets to [facility]. She states that when she is detoxed, she will go back to [facility] which she seemed to do better at.[name] Kitchen

## 2016-06-17 NOTE — Telephone Encounter (Signed)
Pt called & stated has worked out situation with [employer].  She is packing her bag and getting ready to go to [facility] for admit.  She states she does need refills on Imitrex, Propanolol and Duloxetine and would like them before she goes into the hospital incase she has a migraine while she is there.  I explained I do not think they will allow her to take her meds in with her.  So I called [facility] and they confirmed she will not have access to her meds.  I called pt back and let her know this.  So will proceed to [facility] for admittance.

## 2016-06-20 ENCOUNTER — Other Ambulatory Visit: Payer: Self-pay | Admitting: Family Medicine

## 2016-06-21 NOTE — Telephone Encounter (Signed)
Is this okay to refill? 

## 2016-06-22 DIAGNOSIS — F3341 Major depressive disorder, recurrent, in partial remission: Secondary | ICD-10-CM | POA: Diagnosis not present

## 2016-06-22 NOTE — Telephone Encounter (Signed)
Left message at home & cell phone

## 2016-06-23 DIAGNOSIS — R194 Change in bowel habit: Secondary | ICD-10-CM | POA: Diagnosis not present

## 2016-06-23 DIAGNOSIS — F1010 Alcohol abuse, uncomplicated: Secondary | ICD-10-CM | POA: Diagnosis not present

## 2016-06-23 DIAGNOSIS — K625 Hemorrhage of anus and rectum: Secondary | ICD-10-CM | POA: Diagnosis not present

## 2016-06-29 NOTE — Telephone Encounter (Signed)
Pt called with update, states she has been doing good, going thru detox, she is going back to [facility], going thru [place] who will be managing her meds regarding alcoholism,  She saw her counselor twice last week and meeting with him weekly, says she is seeing psychiatrist this week.  Saw GI for issues with alcoholism & they did labs also.  Goes to [facility] daily.  Says doing well and thinks she has kicked it this time

## 2016-07-05 DIAGNOSIS — R6882 Decreased libido: Secondary | ICD-10-CM | POA: Diagnosis not present

## 2016-07-05 DIAGNOSIS — Z6824 Body mass index (BMI) 24.0-24.9, adult: Secondary | ICD-10-CM | POA: Diagnosis not present

## 2016-07-26 DIAGNOSIS — R1030 Lower abdominal pain, unspecified: Secondary | ICD-10-CM | POA: Diagnosis not present

## 2016-07-26 DIAGNOSIS — R102 Pelvic and perineal pain: Secondary | ICD-10-CM | POA: Diagnosis not present

## 2016-08-01 DIAGNOSIS — M8580 Other specified disorders of bone density and structure, unspecified site: Secondary | ICD-10-CM | POA: Diagnosis not present

## 2016-08-09 DIAGNOSIS — Z961 Presence of intraocular lens: Secondary | ICD-10-CM | POA: Diagnosis not present

## 2016-08-27 DIAGNOSIS — M8580 Other specified disorders of bone density and structure, unspecified site: Secondary | ICD-10-CM | POA: Insufficient documentation

## 2016-08-28 ENCOUNTER — Other Ambulatory Visit: Payer: Self-pay | Admitting: Family Medicine

## 2016-08-29 NOTE — Telephone Encounter (Signed)
Is this okay?

## 2016-09-06 ENCOUNTER — Telehealth: Payer: Self-pay | Admitting: Family Medicine

## 2016-09-06 NOTE — Telephone Encounter (Signed)
[employer] for [employer]

## 2016-09-06 NOTE — Telephone Encounter (Signed)
Pt wants to come in for a tetnus shot since she works with [facility]. She thinks it may be time for this shot. Ca she come in for a tetnus? Call her at [phone] and leave a message.

## 2016-09-07 ENCOUNTER — Other Ambulatory Visit (INDEPENDENT_AMBULATORY_CARE_PROVIDER_SITE_OTHER): Payer: Federal, State, Local not specified - PPO

## 2016-09-07 DIAGNOSIS — Z23 Encounter for immunization: Secondary | ICD-10-CM | POA: Diagnosis not present

## 2016-09-07 DIAGNOSIS — F3341 Major depressive disorder, recurrent, in partial remission: Secondary | ICD-10-CM | POA: Diagnosis not present

## 2016-09-07 NOTE — Telephone Encounter (Signed)
I have called pt to inform her she could call and make nurse visit to get tdap

## 2016-10-27 ENCOUNTER — Other Ambulatory Visit: Payer: Self-pay | Admitting: Family Medicine

## 2016-10-27 NOTE — Telephone Encounter (Signed)
Is this okay to refill? 

## 2016-12-07 DIAGNOSIS — F1020 Alcohol dependence, uncomplicated: Secondary | ICD-10-CM | POA: Diagnosis not present

## 2016-12-08 ENCOUNTER — Other Ambulatory Visit: Payer: Self-pay | Admitting: Obstetrics and Gynecology

## 2016-12-08 DIAGNOSIS — Z1239 Encounter for other screening for malignant neoplasm of breast: Secondary | ICD-10-CM

## 2016-12-17 ENCOUNTER — Other Ambulatory Visit: Payer: Self-pay | Admitting: Family Medicine

## 2016-12-19 NOTE — Telephone Encounter (Signed)
Is this okay to refill? 

## 2016-12-27 DIAGNOSIS — H903 Sensorineural hearing loss, bilateral: Secondary | ICD-10-CM | POA: Diagnosis not present

## 2016-12-28 DIAGNOSIS — F3341 Major depressive disorder, recurrent, in partial remission: Secondary | ICD-10-CM | POA: Diagnosis not present

## 2017-01-18 DIAGNOSIS — F1020 Alcohol dependence, uncomplicated: Secondary | ICD-10-CM | POA: Diagnosis not present

## 2017-01-25 ENCOUNTER — Other Ambulatory Visit: Payer: Self-pay | Admitting: Family Medicine

## 2017-01-25 NOTE — Telephone Encounter (Signed)
Have her come in for a visit 

## 2017-01-26 NOTE — Telephone Encounter (Signed)
LM to CB and schedule appt

## 2017-02-09 DIAGNOSIS — F1020 Alcohol dependence, uncomplicated: Secondary | ICD-10-CM | POA: Diagnosis not present

## 2017-02-14 DIAGNOSIS — F1020 Alcohol dependence, uncomplicated: Secondary | ICD-10-CM | POA: Diagnosis not present

## 2017-02-14 DIAGNOSIS — K080 Exfoliation of teeth due to systemic causes: Secondary | ICD-10-CM | POA: Diagnosis not present

## 2017-02-27 ENCOUNTER — Ambulatory Visit
Admission: RE | Admit: 2017-02-27 | Discharge: 2017-02-27 | Disposition: A | Discharge status: Home / Self Care | Payer: Federal, State, Local not specified - PPO | Source: Ambulatory Visit | Attending: Obstetrics and Gynecology | Admitting: Obstetrics and Gynecology

## 2017-02-27 DIAGNOSIS — Z1231 Encounter for screening mammogram for malignant neoplasm of breast: Secondary | ICD-10-CM | POA: Diagnosis not present

## 2017-02-27 DIAGNOSIS — Z1239 Encounter for other screening for malignant neoplasm of breast: Secondary | ICD-10-CM

## 2017-03-20 DIAGNOSIS — K080 Exfoliation of teeth due to systemic causes: Secondary | ICD-10-CM | POA: Diagnosis not present

## 2017-03-30 DIAGNOSIS — F1020 Alcohol dependence, uncomplicated: Secondary | ICD-10-CM | POA: Diagnosis not present

## 2017-04-03 ENCOUNTER — Telehealth: Payer: Self-pay

## 2017-04-03 ENCOUNTER — Other Ambulatory Visit: Payer: Self-pay | Admitting: Family Medicine

## 2017-04-03 NOTE — Telephone Encounter (Signed)
Called pt to setb up med check for imtrex. Pt has no appt on the sche. Thanks [employer]

## 2017-04-05 ENCOUNTER — Telehealth: Payer: Self-pay | Admitting: Family Medicine

## 2017-04-05 MED ORDER — SUMATRIPTAN SUCCINATE 100 MG PO TABS: ORAL_TABLET | ORAL | 0 refills | Status: DC

## 2017-04-05 NOTE — Telephone Encounter (Signed)
Pt called and made a medcheck appt for feb the 22nd if she could get a refill for her imitrex [id number] [place], [place] - [address] pt can be reached at [phone]

## 2017-04-18 DIAGNOSIS — F1020 Alcohol dependence, uncomplicated: Secondary | ICD-10-CM | POA: Diagnosis not present

## 2017-04-21 ENCOUNTER — Encounter: Payer: Federal, State, Local not specified - PPO | Admitting: Family Medicine

## 2017-04-26 DIAGNOSIS — F3341 Major depressive disorder, recurrent, in partial remission: Secondary | ICD-10-CM | POA: Diagnosis not present

## 2017-05-10 ENCOUNTER — Encounter: Payer: Federal, State, Local not specified - PPO | Admitting: Family Medicine

## 2017-05-11 DIAGNOSIS — F1020 Alcohol dependence, uncomplicated: Secondary | ICD-10-CM | POA: Diagnosis not present

## 2017-05-12 ENCOUNTER — Encounter: Payer: Federal, State, Local not specified - PPO | Admitting: Family Medicine

## 2017-05-16 ENCOUNTER — Encounter: Payer: Federal, State, Local not specified - PPO | Admitting: Family Medicine

## 2017-05-17 DIAGNOSIS — D225 Melanocytic nevi of trunk: Secondary | ICD-10-CM | POA: Diagnosis not present

## 2017-05-17 DIAGNOSIS — Z86018 Personal history of other benign neoplasm: Secondary | ICD-10-CM | POA: Diagnosis not present

## 2017-05-17 DIAGNOSIS — L821 Other seborrheic keratosis: Secondary | ICD-10-CM | POA: Diagnosis not present

## 2017-05-17 DIAGNOSIS — D1801 Hemangioma of skin and subcutaneous tissue: Secondary | ICD-10-CM | POA: Diagnosis not present

## 2017-05-29 ENCOUNTER — Encounter: Payer: Federal, State, Local not specified - PPO | Admitting: Family Medicine

## 2017-06-13 ENCOUNTER — Telehealth: Payer: Self-pay | Admitting: Internal Medicine

## 2017-06-13 MED ORDER — SUMATRIPTAN SUCCINATE 100 MG PO TABS: ORAL_TABLET | ORAL | 0 refills | Status: DC

## 2017-06-13 NOTE — Telephone Encounter (Signed)
Pt called and rescheduled her med check to next week. Pt would like a refill on her imitrex to [address]. Is this okay

## 2017-06-14 ENCOUNTER — Encounter: Payer: Federal, State, Local not specified - PPO | Admitting: Family Medicine

## 2017-06-22 ENCOUNTER — Encounter: Payer: Federal, State, Local not specified - PPO | Admitting: Family Medicine

## 2017-06-30 ENCOUNTER — Encounter: Payer: Self-pay | Admitting: Family Medicine

## 2017-07-06 ENCOUNTER — Encounter: Payer: Self-pay | Admitting: Family Medicine

## 2017-07-07 ENCOUNTER — Encounter: Payer: Federal, State, Local not specified - PPO | Admitting: Family Medicine

## 2017-07-20 ENCOUNTER — Ambulatory Visit (HOSPITAL_COMMUNITY)
Admission: EM | Admit: 2017-07-20 | Discharge: 2017-07-20 | Disposition: A | Discharge status: Home / Self Care | Payer: Federal, State, Local not specified - PPO

## 2017-07-20 ENCOUNTER — Other Ambulatory Visit: Payer: Self-pay

## 2017-07-20 ENCOUNTER — Emergency Department (HOSPITAL_COMMUNITY)
Admission: EM | Admit: 2017-07-20 | Discharge: 2017-07-21 | Disposition: A | Discharge status: Home / Self Care | Payer: Federal, State, Local not specified - PPO | Attending: Emergency Medicine | Admitting: Emergency Medicine

## 2017-07-20 ENCOUNTER — Encounter (HOSPITAL_COMMUNITY): Payer: Self-pay | Admitting: Emergency Medicine

## 2017-07-20 DIAGNOSIS — F10939 Alcohol use, unspecified with withdrawal, unspecified: Secondary | ICD-10-CM | POA: Diagnosis present

## 2017-07-20 DIAGNOSIS — F10239 Alcohol dependence with withdrawal, unspecified: Secondary | ICD-10-CM | POA: Insufficient documentation

## 2017-07-20 DIAGNOSIS — K760 Fatty (change of) liver, not elsewhere classified: Secondary | ICD-10-CM | POA: Diagnosis present

## 2017-07-20 DIAGNOSIS — E43 Unspecified severe protein-calorie malnutrition: Secondary | ICD-10-CM | POA: Diagnosis present

## 2017-07-20 DIAGNOSIS — R8281 Pyuria: Secondary | ICD-10-CM | POA: Diagnosis present

## 2017-07-20 DIAGNOSIS — N390 Urinary tract infection, site not specified: Secondary | ICD-10-CM | POA: Diagnosis present

## 2017-07-20 LAB — URINALYSIS, ROUTINE W REFLEX MICROSCOPIC
Glucose, UA: NEGATIVE mg/dL
Hgb urine dipstick: NEGATIVE
Ketones, ur: 5 mg/dL — AB
Nitrite: NEGATIVE
Protein, ur: 100 mg/dL — AB
Specific Gravity, Urine: 1.029 (ref 1.005–1.030)
pH: 5 (ref 5.0–8.0)

## 2017-07-20 LAB — CBC WITH DIFFERENTIAL/PLATELET
ABS IMMATURE GRANULOCYTES: 0 10*3/uL (ref 0.0–0.1)
BASOS ABS: 0.1 10*3/uL (ref 0.0–0.1)
Basophils Relative: 1 %
Eosinophils Absolute: 0 10*3/uL (ref 0.0–0.7)
Eosinophils Relative: 0 %
HCT: 41.5 % (ref 36.0–46.0)
HEMOGLOBIN: 14.2 g/dL (ref 12.0–15.0)
IMMATURE GRANULOCYTES: 0 %
LYMPHS PCT: 11 %
Lymphs Abs: 1.1 10*3/uL (ref 0.7–4.0)
MCH: 33.5 pg (ref 26.0–34.0)
MCHC: 34.2 g/dL (ref 30.0–36.0)
MCV: 97.9 fL (ref 78.0–100.0)
MONO ABS: 1 10*3/uL (ref 0.1–1.0)
Monocytes Relative: 10 %
NEUTROS ABS: 7.6 10*3/uL (ref 1.7–7.7)
NEUTROS PCT: 78 %
Platelets: 351 10*3/uL (ref 150–400)
RBC: 4.24 MIL/uL (ref 3.87–5.11)
RDW: 12.6 % (ref 11.5–15.5)
WBC: 9.8 10*3/uL (ref 4.0–10.5)

## 2017-07-20 LAB — COMPREHENSIVE METABOLIC PANEL
ALBUMIN: 4.4 g/dL (ref 3.5–5.0)
ALK PHOS: 76 U/L (ref 38–126)
ALT: 17 U/L (ref 14–54)
AST: 36 U/L (ref 15–41)
Anion gap: 14 (ref 5–15)
BUN: 8 mg/dL (ref 6–20)
CALCIUM: 9.5 mg/dL (ref 8.9–10.3)
CHLORIDE: 101 mmol/L (ref 101–111)
CO2: 28 mmol/L (ref 22–32)
CREATININE: 0.93 mg/dL (ref 0.44–1.00)
GFR calc Af Amer: >60 mL/min (ref >60)
GFR calc non Af Amer: >60 mL/min (ref >60)
GLUCOSE: 118 mg/dL — AB (ref 65–99)
Potassium: 3.8 mmol/L (ref 3.5–5.1)
SODIUM: 143 mmol/L (ref 135–145)
Total Bilirubin: 1 mg/dL (ref 0.3–1.2)
Total Protein: 7.6 g/dL (ref 6.5–8.1)

## 2017-07-20 LAB — ETHANOL: Alcohol, Ethyl (B): <10 mg/dL (ref <10)

## 2017-07-20 MED ORDER — THIAMINE HCL 100 MG/ML IJ SOLN
Freq: Once | INTRAVENOUS | Status: AC
  Administered 2017-07-20: 21:00:00 via INTRAVENOUS
  Filled 2017-07-20 (×2): qty 1000

## 2017-07-20 MED ORDER — LORAZEPAM 1 MG PO TABS
0.0000 mg | ORAL_TABLET | Freq: Four times a day (QID) | ORAL | Status: DC
  Administered 2017-07-20: 1 mg via ORAL
  Filled 2017-07-20: qty 1

## 2017-07-20 MED ORDER — LORAZEPAM 1 MG PO TABS: 0.0000 mg | ORAL_TABLET | Freq: Two times a day (BID) | ORAL | Status: DC

## 2017-07-20 MED ORDER — LORAZEPAM 1 MG PO TABS
1.0000 mg | ORAL_TABLET | Freq: Once | ORAL | Status: AC
Start: 2017-07-20 — End: 2017-07-20
  Administered 2017-07-20: 1 mg via ORAL
  Filled 2017-07-20: qty 1

## 2017-07-20 MED ORDER — LORAZEPAM 2 MG/ML IJ SOLN: 0.0000 mg | Freq: Two times a day (BID) | INTRAMUSCULAR | Status: DC

## 2017-07-20 MED ORDER — THIAMINE HCL 100 MG/ML IJ SOLN: 100.0000 mg | Freq: Every day | INTRAMUSCULAR | Status: DC

## 2017-07-20 MED ORDER — VITAMIN B-1 100 MG PO TABS
100.0000 mg | ORAL_TABLET | Freq: Every day | ORAL | Status: DC
  Administered 2017-07-20: 100 mg via ORAL
  Filled 2017-07-20: qty 1

## 2017-07-20 MED ORDER — LORAZEPAM 2 MG/ML IJ SOLN: 0.0000 mg | Freq: Four times a day (QID) | INTRAMUSCULAR | Status: DC

## 2017-07-20 MED ORDER — ONDANSETRON 4 MG PO TBDP
4.0000 mg | ORAL_TABLET | Freq: Once | ORAL | Status: AC | PRN
  Administered 2017-07-20: 4 mg via ORAL
  Filled 2017-07-20: qty 1

## 2017-07-20 NOTE — ED Notes (Signed)
Slight tremors noted ; pt states " its just from with withdraw , I haven't had any alcohol in the past 24 hours "

## 2017-07-20 NOTE — ED Triage Notes (Signed)
Patient to ED for alcohol withdrawal, states last drink was about 24 hours ago. Pt anxious in triage, has mild tremor, and c/o nausea. A&O x 4.

## 2017-07-20 NOTE — ED Notes (Signed)
Spoke to [place], np.  Suggested patient to go to ed if expecting "detox".  Patient agreeable

## 2017-07-20 NOTE — ED Notes (Signed)
Pt states " I feel a lot better after the ativan pill you gave me "

## 2017-07-20 NOTE — ED Provider Notes (Signed)
Patient placed in Quick Look pathway, seen and evaluated   Chief Complaint: alcohol withdrawl  HPI:   Pt has been at [facility].  Pt had a relapse and began drinking.  Pt states she is now having withdrawal.   ROS: no fever, no shortness of breath  Physical Exam:   Gen: No distress  Neuro: Awake and Alert  Skin: Warm    Focused Exam: shaking, Lung clear. Heart rrr   Initiation of care has begun. The patient has been counseled on the process, plan, and necessity for staying for the completion/evaluation, and the remainder of the medical screening examination   Sidney Ace 07/20/17 1642    Julianne Rice, MD 07/21/17 [phone]

## 2017-07-21 ENCOUNTER — Encounter (HOSPITAL_COMMUNITY): Payer: Self-pay | Admitting: Internal Medicine

## 2017-07-21 DIAGNOSIS — R8281 Pyuria: Secondary | ICD-10-CM | POA: Diagnosis present

## 2017-07-21 DIAGNOSIS — F10239 Alcohol dependence with withdrawal, unspecified: Secondary | ICD-10-CM | POA: Diagnosis present

## 2017-07-21 DIAGNOSIS — N390 Urinary tract infection, site not specified: Secondary | ICD-10-CM | POA: Diagnosis present

## 2017-07-21 DIAGNOSIS — F10939 Alcohol use, unspecified with withdrawal, unspecified: Secondary | ICD-10-CM | POA: Diagnosis present

## 2017-07-21 LAB — MAGNESIUM: Magnesium: 1.5 mg/dL — ABNORMAL LOW (ref 1.7–2.4)

## 2017-07-21 LAB — PHOSPHORUS: PHOSPHORUS: 3.8 mg/dL (ref 2.5–4.6)

## 2017-07-21 MED ORDER — CHLORDIAZEPOXIDE HCL 25 MG PO CAPS: ORAL_CAPSULE | ORAL | 0 refills | Status: DC

## 2017-07-21 MED ORDER — SODIUM CHLORIDE 0.9 % IV SOLN: 1.0000 g | Freq: Every day | INTRAVENOUS | Status: DC

## 2017-07-21 NOTE — ED Provider Notes (Signed)
  Face-to-face evaluation   History: Here for EtOH w/d sx. No EtOH > 36 hrs. Drinking heavily for 3-4 months  Physical exam:Tremorous, mild confusion. Distracted. No respiratory distress. Cooperative.  Medical screening examination/treatment/procedure(s) were conducted as a shared visit with non-physician practitioner(s) and myself.  I personally evaluated the patient during the encounter    Daleen Bo, MD 07/29/17 [phone]

## 2017-07-21 NOTE — Discharge Instructions (Signed)
Return here as needed.  Follow-up with your resources at [facility].

## 2017-07-22 LAB — URINE CULTURE

## 2017-07-22 LAB — MISC LABCORP TEST (SEND OUT): LABCORP TEST CODE: 726778

## 2017-07-28 NOTE — ED Provider Notes (Signed)
[facility] EMERGENCY DEPARTMENT Provider Note   CSN: [id number] Arrival date & time: 07/20/17  1612     History   Chief Complaint Chief Complaint  Patient presents with  . Withdrawal    HPI Terri Ayala is a 62 y.o. female.  HPI  Patient presents to the emergency department with alcohol withdrawal.  The patient states that she had been sober for about a year when she started drinking just after the holidays.  The patient states she normally drinks 3-4 bottles of wine a day.  The patient states that nothing seems to make her condition better or worse.  The patient denies any tremor hallucination.  Patient does states she feels very jittery and anxious.  The patient states that she last had something to drink yesterday.  The patient denies chest pain, shortness of breath, headache,blurred vision, neck pain, fever, cough, weakness, numbness, dizziness, anorexia, edema, abdominal pain, nausea, vomiting, diarrhea, rash, back pain, dysuria, hematemesis, bloody stool, near syncope, or syncope. Past Medical History:  Diagnosis Date  . Alcoholism ([place])   . Anxiety   . ASYMPTOMATIC POSTMENOPAUSAL STATUS 08/17/2009  . DYSPHAGIA 06/19/2007  . Fatty liver, alcoholic   . GOITER, MULTINODULAR 08/17/2009  . Hyperlipidemia   . Low back pain   . Migraines   . Mitral valve prolapse   . OSTEOPOROSIS 08/17/2009  . PONV (postoperative nausea and vomiting)   . Supraventricular tachycardia [facility])     Patient Active Problem List   Diagnosis Date Noted  . Pyuria 07/21/2017  . Alcohol withdrawal ([place]) 07/21/2017  . Acute lower UTI 07/21/2017  . Major depressive disorder, recurrent episode, moderate ([place])   . Alcohol use disorder, severe, dependence ([place]) 04/03/2014  . Alcohol use disorder, severe 06/18/2013  . MDD (major depressive disorder) 06/18/2013  . Lumbosacral radiculopathy at L5 05/20/2013  . Protein-calorie malnutrition, severe ([place]) 03/18/2013  . Nonspecific elevation of  levels of transaminase or lactic acid dehydrogenase (LDH) 03/16/2013  . Hepatic steatosis 03/16/2013  . Chronic cholecystitis 03/14/2013  . Cervical arthritis 10/12/2012  . Palpitations 11/29/2010  . SVT (supraventricular tachycardia) ([place]) 11/29/2010  . Mitral valve prolapse 11/29/2010  . Decreased libido 11/15/2010  . GOITER, MULTINODULAR 08/17/2009  . ANXIETY 08/17/2009  . Migraine headache 08/17/2009  . HEARING LOSS 08/17/2009  . OSTEOPOROSIS 08/17/2009  . ASYMPTOMATIC POSTMENOPAUSAL STATUS 08/17/2009  . DYSPHAGIA 06/19/2007    Past Surgical History:  Procedure Laterality Date  . BREAST BIOPSY Right   . CERVIX SURGERY    . CHOLECYSTECTOMY N/A 03/14/2013   Procedure: LAPAROSCOPIC CHOLECYSTECTOMY WITH ATTEMPTED INTRAOPERATIVE CHOLANGIOGRAM;  Surgeon: Harl Bowie, MD;  Location: [facility];  Service: General;  Laterality: N/A;  . laporoscopic abdominal surgery       OB History   None      Home Medications    Prior to Admission medications   Medication Sig Start Date End Date Taking? Authorizing Provider  alendronate (FOSAMAX) 70 MG tablet Take 70 mg by mouth once a week. Take with a full glass of water on an empty stomach.   Yes [provider]  busPIRone (BUSPAR) 15 MG tablet Take 15 mg by mouth 3 (three) times daily.   Yes [provider]  DULoxetine (CYMBALTA) 60 MG capsule TAKE 1 CAPSULE (60 MG TOTAL) BY MOUTH DAILY. 10/23/14  Yes Denita Lung, MD  hydrOXYzine (ATARAX/VISTARIL) 10 MG tablet Take 10 mg by mouth 2 (two) times daily. 07/10/17  Yes [provider]  naltrexone (DEPADE) 50  MG tablet Take 50 mg by mouth daily. 07/10/17  Yes [provider]  norethindrone-ethinyl estradiol (FEMHRT 1/5) 1-5 MG-MCG TABS Take 1 tablet by mouth daily. For Osteoporosis 04/07/14  Yes Lindell Spar I, NP  propranolol (INDERAL) 10 MG tablet Take 20 mg by mouth 2 (two) times daily.    Yes [provider]  SUMAtriptan (IMITREX) 100 MG tablet  TAKE 1 TABLET BY MOUTH EVERY 2 HOURS AS NEEDED Patient taking differently: Take 100 mg by mouth every 2 (two) hours as needed for migraine.  06/13/17  Yes Denita Lung, MD  traZODone (DESYREL) 50 MG tablet Take 50 mg by mouth at bedtime as needed for sleep.    Yes [provider]  chlordiazePOXIDE (LIBRIUM) 25 MG capsule 50mg  PO TID x 1D, then 25-50mg  PO BID X 1D, then 25-50mg  PO QD X 1D 07/21/17   Bekim Werntz, Harrell Gave, PA-C    Family History Family History  Problem Relation Age of Onset  . Colon cancer Father        Deceased, 24  . Osteoporosis Mother        Living, 62  . Healthy Brother   . Thyroid disease Neg Hx   . Goiter Neg Hx     Social History Social History   Tobacco Use  . Smoking status: Never Smoker  . Smokeless tobacco: Never Used  Substance Use Topics  . Alcohol use: Yes    Comment: bottle of wine daily  . Drug use: No     Allergies   Codeine; Doxycycline; and Tetracycline hcl   Review of Systems Review of Systems All other systems negative except as documented in the HPI. All pertinent positives and negatives as reviewed in the HPI.  Physical Exam Updated Vital Signs BP (!) 132/93   Pulse (!) 110   Temp 98.6 F (37 C) (Oral)   Resp 17   SpO2 98%   Physical Exam  Constitutional: She is oriented to person, place, and time. She appears well-developed and well-nourished. No distress.  HENT:  Head: Normocephalic and atraumatic.  Mouth/Throat: Oropharynx is clear and moist.  Eyes: Pupils are equal, round, and reactive to light.  Neck: Normal range of motion. Neck supple.  Cardiovascular: Normal rate, regular rhythm and normal heart sounds. Exam reveals no gallop and no friction rub.  No murmur heard. Pulmonary/Chest: Effort normal and breath sounds normal. No respiratory distress. She has no wheezes.  Abdominal: Soft. Bowel sounds are normal. She exhibits no distension. There is no tenderness.  Neurological: She is alert and oriented to  person, place, and time. She exhibits normal muscle tone. Coordination normal.  Skin: Skin is warm and dry. Capillary refill takes less than 2 seconds. No rash noted. No erythema.  Psychiatric: Her behavior is normal. Her mood appears anxious.  Nursing note and vitals reviewed.    ED Treatments / Results  Labs (all labs ordered are listed, but only abnormal results are displayed) Labs Reviewed  URINE CULTURE - Abnormal; Notable for the following components:      Result Value   Culture MULTIPLE SPECIES PRESENT, SUGGEST RECOLLECTION (*)    All other components within normal limits  COMPREHENSIVE METABOLIC PANEL - Abnormal; Notable for the following components:   Glucose, Bld 118 (*)    All other components within normal limits  URINALYSIS, ROUTINE W REFLEX MICROSCOPIC - Abnormal; Notable for the following components:   Color, Urine AMBER (*)    APPearance HAZY (*)    Bilirubin Urine MODERATE (*)  Ketones, ur 5 (*)    Protein, ur 100 (*)    Leukocytes, UA MODERATE (*)    Bacteria, UA FEW (*)    All other components within normal limits  MAGNESIUM - Abnormal; Notable for the following components:   Magnesium 1.5 (*)    All other components within normal limits  CBC WITH DIFFERENTIAL/PLATELET  ETHANOL  MISC [facility] TEST (SEND OUT)  PHOSPHORUS    EKG None  Radiology No results found.  Procedures Procedures (including critical care time)  Medications Ordered in ED Medications  ondansetron (ZOFRAN-ODT) disintegrating tablet 4 mg (4 mg Oral Given 07/20/17 1627)  LORazepam (ATIVAN) tablet 1 mg (1 mg Oral Given 07/20/17 1650)  sodium chloride 0.9 % 1,000 mL with thiamine 220 mg, folic acid 1 mg, multivitamins adult 10 mL infusion ( Intravenous Stopped 07/21/17 0134)     Initial Impression / Assessment and Plan / ED Course  I have reviewed the triage vital signs and the nursing notes.  Pertinent labs & imaging results that were available during my care of the patient were  reviewed by me and considered in my medical decision making (see chart for details).     I spoke with the admitting doctors about admission for the patient and the patient declines being admitted.  Patient will be treated with Librium advised to return here for any worsening in her condition patient agrees the plan and all questions were answered.  The patient states that she would much rather be at home and working with her counselor rather than being in the hospital.  Patient is been stable here in the emergency department with no complicating factors at this point.  Final Clinical Impressions(s) / ED Diagnoses   Final diagnoses:  Alcohol withdrawal syndrome with complication [facility])    ED Discharge Orders        Ordered    chlordiazePOXIDE (LIBRIUM) 25 MG capsule     07/21/17 0125       Dalia Heading, PA-C 07/28/17 1154    Daleen Bo, MD 07/29/17 [id number]

## 2017-07-31 ENCOUNTER — Encounter: Payer: Self-pay | Admitting: Family Medicine

## 2017-07-31 ENCOUNTER — Ambulatory Visit: Payer: Federal, State, Local not specified - PPO | Admitting: Family Medicine

## 2017-07-31 VITALS — BP 98/60 | HR 75 | Temp 97.9°F | Ht 63.0 in | Wt 144.2 lb

## 2017-07-31 DIAGNOSIS — G43009 Migraine without aura, not intractable, without status migrainosus: Secondary | ICD-10-CM | POA: Diagnosis not present

## 2017-07-31 DIAGNOSIS — N3000 Acute cystitis without hematuria: Secondary | ICD-10-CM

## 2017-07-31 DIAGNOSIS — F1020 Alcohol dependence, uncomplicated: Secondary | ICD-10-CM

## 2017-07-31 LAB — POCT URINALYSIS DIP (PROADVANTAGE DEVICE)
BILIRUBIN UA: NEGATIVE mg/dL
Bilirubin, UA: NEGATIVE
Glucose, UA: NEGATIVE mg/dL
Nitrite, UA: NEGATIVE
PH UA: 6 (ref 5.0–8.0)
RBC UA: NEGATIVE
Specific Gravity, Urine: 1.03
Urobilinogen, Ur: 3.5

## 2017-07-31 MED ORDER — SUMATRIPTAN SUCCINATE 100 MG PO TABS: ORAL_TABLET | ORAL | 5 refills | Status: DC

## 2017-07-31 MED ORDER — AMOXICILLIN 875 MG PO TABS: 875.0000 mg | ORAL_TABLET | Freq: Two times a day (BID) | ORAL | 0 refills | Status: DC

## 2017-07-31 NOTE — Addendum Note (Signed)
Addended by: Elyse Jarvis on: 07/31/2017 04:43 PM   Modules accepted: Orders

## 2017-07-31 NOTE — Progress Notes (Signed)
   Subjective:    Patient ID: Terri Ayala, female    DOB: [birth year], 62 y.o.   MRN: [id number]  HPI She has had difficulty over the last 3 weeks with intermittent urgency, dysuria, frequency but no fever, chills or abdominal pain.  She also has a history of migraine headaches and gets roughly 4 or 5 migraines per month.  Imitrex does work.  She has a hard time determining whether it is a regular headache or migraine. She is involved with fellowship I will and going to weekly follow-up meetings there.  She also goes to [place].  She does occasionally have a relapse.  The last one was in late May.  Alert and in no distress   Review of Systems     Objective:   Physical Exam .  Urine microscopic was positive       Assessment & Plan:  Acute cystitis without hematuria - Plan: amoxicillin (AMOXIL) 875 MG tablet  Alcohol use disorder, severe, dependence (HCC)  Migraine without aura and without status migrainosus, not intractable - Plan: SUMAtriptan (IMITREX) 100 MG tablet  I will treat with Amoxil which apparently has worked well in the past.  We will also give Imitrex.  Did recommend that she take 800 mg of ibuprofen at the first sign of a a headache and if it does transform into a migraine, then use the Imitrex. I congratulated her on going to [employer] and strongly encouraged her to continue with that.  Encouraged her to look into the reasoning behind why she sometimes falters with her alcohol.

## 2017-07-31 NOTE — Patient Instructions (Signed)
In the future when you get a headache initially take 4 ibuprofen at the first sign of a headache.  Do not wait.  If it turns into a migraine and you know what again the sooner you take the Imitrex the better

## 2017-08-12 ENCOUNTER — Ambulatory Visit: Payer: Federal, State, Local not specified - PPO | Admitting: Sports Medicine

## 2017-08-12 ENCOUNTER — Ambulatory Visit: Payer: Self-pay

## 2017-08-12 ENCOUNTER — Encounter: Payer: Self-pay | Admitting: Sports Medicine

## 2017-08-12 VITALS — BP 126/72 | HR 98 | Temp 98.4°F | Resp 16 | Ht 62.0 in | Wt 140.0 lb

## 2017-08-12 DIAGNOSIS — M7751 Other enthesopathy of right foot: Secondary | ICD-10-CM

## 2017-08-12 DIAGNOSIS — M79671 Pain in right foot: Secondary | ICD-10-CM

## 2017-08-12 DIAGNOSIS — M779 Enthesopathy, unspecified: Secondary | ICD-10-CM

## 2017-08-12 MED ORDER — MELOXICAM 15 MG PO TABS: 15.0000 mg | ORAL_TABLET | Freq: Every day | ORAL | 0 refills | Status: DC

## 2017-08-12 MED ORDER — METHYLPREDNISOLONE 4 MG PO TBPK: ORAL_TABLET | ORAL | 0 refills | Status: DC

## 2017-08-12 NOTE — Progress Notes (Signed)
   Subjective:    Patient ID: Terri Ayala, female    DOB: 09/23/1955, 62 y.o.   MRN: [id number]  HPI    Review of Systems  Musculoskeletal: Positive for arthralgias, gait problem, joint swelling and myalgias.  All other systems reviewed and are negative.      Objective:   Physical Exam        Assessment & Plan:

## 2017-08-12 NOTE — Progress Notes (Signed)
Subjective: Terri Ayala is a 62 y.o. female patient who presents to office for evaluation of Right foot pain. Patient complains of progressive pain especially over the last year in the Right foot at the top, ankle, shin, and heel. Ranks pain 6/10 and is now interferring with daily activities especially with certain motions for the last year. Patient has tried supportive shoes with no relief in symptoms. Patient denies any other pedal complaints. Denies injury/trip/fall/sprain/any causative factors.   Review of Systems  Musculoskeletal: Positive for joint pain and myalgias.       Gait problem R foot pain     Patient Active Problem List   Diagnosis Date Noted  . Alcohol withdrawal ([place]) 07/21/2017  . Major depressive disorder, recurrent episode, moderate ([place])   . Alcohol use disorder, severe, dependence ([place]) 04/03/2014  . Lumbosacral radiculopathy at L5 05/20/2013  . Nonspecific elevation of levels of transaminase or lactic acid dehydrogenase (LDH) 03/16/2013  . Hepatic steatosis 03/16/2013  . Chronic cholecystitis 03/14/2013  . Cervical arthritis 10/12/2012  . Palpitations 11/29/2010  . SVT (supraventricular tachycardia) ([place]) 11/29/2010  . Mitral valve prolapse 11/29/2010  . Decreased libido 11/15/2010  . GOITER, MULTINODULAR 08/17/2009  . ANXIETY 08/17/2009  . Migraine headache 08/17/2009  . HEARING LOSS 08/17/2009  . OSTEOPOROSIS 08/17/2009  . ASYMPTOMATIC POSTMENOPAUSAL STATUS 08/17/2009  . DYSPHAGIA 06/19/2007    Current Outpatient Medications on File Prior to Visit  Medication Sig Dispense Refill  . alendronate (FOSAMAX) 70 MG tablet Take 70 mg by mouth once a week. Take with a full glass of water on an empty stomach.    [name] Kitchen amoxicillin (AMOXIL) 875 MG tablet Take 1 tablet (875 mg total) by mouth 2 (two) times daily. 20 tablet 0  . busPIRone (BUSPAR) 15 MG tablet Take 15 mg by mouth 3 (three) times daily.    . chlordiazePOXIDE (LIBRIUM) 25 MG capsule 50mg  PO TID x 1D, then  25-50mg  PO BID X 1D, then 25-50mg  PO QD X 1D 10 capsule 0  . DULoxetine (CYMBALTA) 60 MG capsule TAKE 1 CAPSULE (60 MG TOTAL) BY MOUTH DAILY. 30 capsule 0  . hydrOXYzine (ATARAX/VISTARIL) 10 MG tablet Take 10 mg by mouth 2 (two) times daily.    . naltrexone (DEPADE) 50 MG tablet Take 50 mg by mouth daily.    . norethindrone-ethinyl estradiol (FEMHRT 1/5) 1-5 MG-MCG TABS Take 1 tablet by mouth daily. For Osteoporosis 28 tablet   . propranolol (INDERAL) 10 MG tablet Take 20 mg by mouth 2 (two) times daily.     . SUMAtriptan (IMITREX) 100 MG tablet Take 1 tablet at the first sign of migraine.  May repeat in 2 hours. 10 tablet 5  . traZODone (DESYREL) 50 MG tablet Take 50 mg by mouth at bedtime as needed for sleep.      No current facility-administered medications on file prior to visit.     Allergies  Allergen Reactions  . Codeine Itching  . Doxycycline Other (See Comments)    Reaction unknown  . Tetracycline Hcl Other (See Comments)    Reaction unknown    Objective:  General: Alert and oriented x3 in no acute distress  Dermatology: No open lesions bilateral lower extremities, no webspace macerations, no ecchymosis bilateral, all nails x 10 are well manicured.  Vascular: Dorsalis Pedis and Posterior Tibial pedal pulses palpable, Capillary Fill Time 3 seconds,(+) pedal hair growth bilateral, no edema bilateral lower extremities, Temperature gradient within normal limits.  Neurology: Gross sensation intact via light touch bilateral.  Musculoskeletal: Mild tenderness with palpation diffusely to right foot with most pain with inversion and eversion at ankle without laxityand along extensor tendons,No pain with calf compression bilateral. Strength within normal limits in all groups bilateral.   Gait: Minimally Antalgic gait  Xrays  Right Foot   Impression: Within normal limits no acute pathology  Assessment and Plan: Problem List Items Addressed This Visit    None    Visit  Diagnoses    Right foot pain    -  Primary   Relevant Medications   methylPREDNISolone (MEDROL DOSEPAK) 4 MG TBPK tablet   meloxicam (MOBIC) 15 MG tablet   Other Relevant Orders   DG Foot Complete Right   Tendonitis       Relevant Medications   methylPREDNISolone (MEDROL DOSEPAK) 4 MG TBPK tablet   meloxicam (MOBIC) 15 MG tablet       -Complete examination performed -Xrays reviewed -Discussed treatement options for possible tendonitis -Rx Mobic and medrol -Rx Ankle gauntlet and advised rest, ice, elevation, and good supportive shoes  -Patient to return to office if pain is not better in 1 month (call for appt)  or sooner if condition worsens.  Landis Martins, DPM

## 2017-08-15 DIAGNOSIS — H26492 Other secondary cataract, left eye: Secondary | ICD-10-CM | POA: Diagnosis not present

## 2017-08-22 DIAGNOSIS — K080 Exfoliation of teeth due to systemic causes: Secondary | ICD-10-CM | POA: Diagnosis not present

## 2017-08-28 ENCOUNTER — Telehealth: Payer: Self-pay | Admitting: Family Medicine

## 2017-08-28 ENCOUNTER — Other Ambulatory Visit (INDEPENDENT_AMBULATORY_CARE_PROVIDER_SITE_OTHER): Payer: Federal, State, Local not specified - PPO

## 2017-08-28 DIAGNOSIS — R300 Dysuria: Secondary | ICD-10-CM | POA: Diagnosis not present

## 2017-08-28 DIAGNOSIS — R350 Frequency of micturition: Secondary | ICD-10-CM

## 2017-08-28 DIAGNOSIS — R82998 Other abnormal findings in urine: Secondary | ICD-10-CM | POA: Diagnosis not present

## 2017-08-28 LAB — POCT URINALYSIS DIP (PROADVANTAGE DEVICE)
Bilirubin, UA: NEGATIVE
Glucose, UA: NEGATIVE mg/dL
NITRITE UA: POSITIVE — AB
Specific Gravity, Urine: 1.03
UUROB: NEGATIVE
pH, UA: 6 (ref 5.0–8.0)

## 2017-08-28 MED ORDER — SULFAMETHOXAZOLE-TRIMETHOPRIM 800-160 MG PO TABS: 1.0000 | ORAL_TABLET | Freq: Two times a day (BID) | ORAL | 0 refills | Status: DC

## 2017-08-28 NOTE — Telephone Encounter (Signed)
Pt came in and dropped off a urine. I have ran the urine. She is having frequency, burning, dark. Would you like a urine culture with the urine. If you send in a med [facility] uses [facility] [address]

## 2017-08-28 NOTE — Telephone Encounter (Signed)
Called pt to advise on the med being sent in to pharmacy. No answer lvm . Terri Ayala

## 2017-08-28 NOTE — Addendum Note (Signed)
Addended by: Minette Headland A on: 08/28/2017 04:22 PM   Modules accepted: Orders

## 2017-08-28 NOTE — Telephone Encounter (Signed)
Pt says UTI returned after taking antibiotic and symptoms are much worse this time. Can she come by to drop off UA so she can get more meds?

## 2017-08-28 NOTE — Addendum Note (Signed)
Addended by: Minette Headland A on: 08/28/2017 04:23 PM   Modules accepted: Orders

## 2017-08-28 NOTE — Telephone Encounter (Signed)
yes

## 2017-08-28 NOTE — Telephone Encounter (Signed)
Tell her that she definitely has an infection and have her come back here in 2 weeks.  I called medication in.

## 2017-08-31 LAB — CULTURE, URINE COMPREHENSIVE

## 2017-09-01 ENCOUNTER — Ambulatory Visit: Payer: Federal, State, Local not specified - PPO | Admitting: Family Medicine

## 2017-09-01 ENCOUNTER — Encounter: Payer: Self-pay | Admitting: Family Medicine

## 2017-09-01 VITALS — BP 120/70 | HR 67 | Temp 98.4°F | Resp 18 | Wt 136.4 lb

## 2017-09-01 DIAGNOSIS — R10811 Right upper quadrant abdominal tenderness: Secondary | ICD-10-CM | POA: Diagnosis not present

## 2017-09-01 DIAGNOSIS — R1084 Generalized abdominal pain: Secondary | ICD-10-CM

## 2017-09-01 DIAGNOSIS — N3001 Acute cystitis with hematuria: Secondary | ICD-10-CM | POA: Diagnosis not present

## 2017-09-01 DIAGNOSIS — K760 Fatty (change of) liver, not elsewhere classified: Secondary | ICD-10-CM

## 2017-09-01 DIAGNOSIS — R112 Nausea with vomiting, unspecified: Secondary | ICD-10-CM | POA: Diagnosis not present

## 2017-09-01 LAB — POCT URINALYSIS DIP (PROADVANTAGE DEVICE)
BILIRUBIN UA: NEGATIVE mg/dL
Glucose, UA: NEGATIVE mg/dL
Nitrite, UA: NEGATIVE
RBC UA: NEGATIVE
Specific Gravity, Urine: 1.03
Urobilinogen, Ur: NEGATIVE
pH, UA: 6 (ref 5.0–8.0)

## 2017-09-01 MED ORDER — ONDANSETRON 4 MG PO TBDP: 4.0000 mg | ORAL_TABLET | Freq: Three times a day (TID) | ORAL | 0 refills | Status: DC | PRN

## 2017-09-01 NOTE — Patient Instructions (Addendum)
Your UTI appears to be improving. Continue the antibiotic if you can tolerate it. Take it with food and drink plenty of water.   Take the Zofran as needed for nausea.   If your symptoms get worse over the weekend you should go to the emergency department for further evaluation.   Return next week to see Dr. Redmond School.

## 2017-09-01 NOTE — Progress Notes (Signed)
Subjective:    Patient ID: Terri Ayala, female    DOB: 1955-07-07, 62 y.o.   MRN: [id number]  HPI Chief Complaint  Patient presents with  . nausea and feeling worse    nausea, headache and stomach cramping, swelling. haven't drank since may 23rd.    She is here with complaints of a 2-3 day history of sweating, shaking, nausea, dry heaving and fatigue. Reports generalized abdominal pain but mainly bilateral lower abdominal pain.  No diarrhea or blood in stool.  History of cholecystectomy.  Pain is mainly dull and does not appear to be aggravated by eating.  She does report staying well-hydrated.  Diagnosed with acute cystitis and currently taking Bactrim for this. Still has burning with urination.   History of alcoholism with a relapse in May. Denies having alcohol since.  She is involved with fellowship [facility] outpatient therapy as well as [facility].  She has a history of severe fatty liver disease.   Denies fever, chills, dizziness, chest pain, palpitations, shortness of breath, cough.   Reviewed allergies, medications, past medical, surgical, family, and social history.    Review of Systems Pertinent positives and negatives in the history of present illness.     Objective:   Physical Exam  Constitutional: She is oriented to person, place, and time. She appears well-developed and well-nourished.  Non-toxic appearance. No distress.  HENT:  Mouth/Throat: Uvula is midline, oropharynx is clear and moist and mucous membranes are normal.  Eyes: Pupils are equal, round, and reactive to light. Conjunctivae, EOM and lids are normal.  Neck: Trachea normal and full passive range of motion without pain. Neck supple. No thyromegaly present.  Cardiovascular: Normal rate, regular rhythm and normal pulses.  No LE edema  Pulmonary/Chest: Effort normal and breath sounds normal.  Abdominal: Soft. Normal appearance. Bowel sounds are increased. There is hepatomegaly. There is tenderness in the right  upper quadrant. There is no rigidity, no rebound, no guarding, no CVA tenderness, no tenderness at McBurney's point and negative Murphy's sign.  Musculoskeletal: Normal range of motion.  Lymphadenopathy:    She has no cervical adenopathy.  Neurological: She is alert and oriented to person, place, and time. She has normal strength and normal reflexes. No cranial nerve deficit or sensory deficit. Gait normal. GCS eye subscore is 4. GCS verbal subscore is 5. GCS motor subscore is 6.  Skin: Skin is warm and dry. Capillary refill takes less than 2 seconds. She is not diaphoretic. No pallor.  Psychiatric: She has a normal mood and affect. Her speech is normal and behavior is normal. Thought content normal.   BP 120/70   Pulse 67   Temp 98.4 F (36.9 C) (Oral)   Resp 18   Wt 136 lb 6.4 oz (61.9 kg)   SpO2 98%   BMI 24.95 kg/m       Assessment & Plan:  Generalized abdominal pain - Plan: CBC with Differential/Platelet, Comprehensive metabolic panel, Lipase, Amylase, Hepatitis panel, acute  Acute cystitis with hematuria - Plan: POCT Urinalysis DIP (Proadvantage Device)  Hepatic steatosis - Plan: Hepatitis panel, acute  Right upper quadrant abdominal tenderness without rebound tenderness - Plan: CBC with Differential/Platelet, Comprehensive metabolic panel, Lipase, Amylase, Hepatitis panel, acute  Nausea and vomiting, intractability of vomiting not specified, unspecified vomiting type - Plan: CBC with Differential/Platelet, Comprehensive metabolic panel, Lipase, Amylase, ondansetron (ZOFRAN ODT) 4 MG disintegrating tablet, Hepatitis panel, acute  UTI appears to be improving. Vitals WNL. No sign of acute infectious process.  Nontoxic-appearing.  Discussed potential etiologies including worsening liver disease, pancreatitis. Considered [place] or CT however since she is not toxic and does not have rebound tenderness, we will try conservative treatment for now.  Zofran prescribed for nausea.  Encouraged  her to continue staying hydrated. Check labs and have her follow-up next week with her PCP Dr. Redmond School.  Strict precautions that if she is having worsening symptoms that she will go to the emergency department over the weekend.

## 2017-09-02 LAB — CBC WITH DIFFERENTIAL/PLATELET
BASOS: 1 %
Basophils Absolute: 0.1 10*3/uL (ref 0.0–0.2)
EOS (ABSOLUTE): 0.1 10*3/uL (ref 0.0–0.4)
EOS: 2 %
HEMATOCRIT: 37.6 % (ref 34.0–46.6)
HEMOGLOBIN: 12.4 g/dL (ref 11.1–15.9)
Immature Grans (Abs): 0 10*3/uL (ref 0.0–0.1)
Immature Granulocytes: 0 %
LYMPHS: 25 %
Lymphocytes Absolute: 1.3 10*3/uL (ref 0.7–3.1)
MCH: 32.6 pg (ref 26.6–33.0)
MCHC: 33 g/dL (ref 31.5–35.7)
MCV: 99 fL — AB (ref 79–97)
MONOCYTES: 10 %
Monocytes Absolute: 0.5 10*3/uL (ref 0.1–0.9)
NEUTROS PCT: 62 %
Neutrophils Absolute: 3.1 10*3/uL (ref 1.4–7.0)
Platelets: 214 10*3/uL (ref 150–450)
RBC: 3.8 x10E6/uL (ref 3.77–5.28)
RDW: 11.7 % — AB (ref 12.3–15.4)
WBC: 5 10*3/uL (ref 3.4–10.8)

## 2017-09-02 LAB — COMPREHENSIVE METABOLIC PANEL
A/G RATIO: 1.7 (ref 1.2–2.2)
ALBUMIN: 4.3 g/dL (ref 3.6–4.8)
ALK PHOS: 34 IU/L — AB (ref 39–117)
ALT: 6 IU/L (ref 0–32)
AST: 11 IU/L (ref 0–40)
BUN / CREAT RATIO: 19 (ref 12–28)
BUN: 16 mg/dL (ref 8–27)
Bilirubin Total: 0.3 mg/dL (ref 0.0–1.2)
CO2: 21 mmol/L (ref 20–29)
CREATININE: 0.86 mg/dL (ref 0.57–1.00)
Calcium: 9 mg/dL (ref 8.7–10.3)
Chloride: 106 mmol/L (ref 96–106)
GFR calc Af Amer: 83 mL/min/{1.73_m2} (ref >59)
GFR, EST NON AFRICAN AMERICAN: 72 mL/min/{1.73_m2} (ref >59)
GLOBULIN, TOTAL: 2.5 g/dL (ref 1.5–4.5)
Glucose: 87 mg/dL (ref 65–99)
POTASSIUM: 4.5 mmol/L (ref 3.5–5.2)
SODIUM: 139 mmol/L (ref 134–144)
Total Protein: 6.8 g/dL (ref 6.0–8.5)

## 2017-09-02 LAB — HEPATITIS PANEL, ACUTE
HEP B C IGM: NEGATIVE
Hep A IgM: NEGATIVE
Hepatitis B Surface Ag: NEGATIVE

## 2017-09-02 LAB — AMYLASE: AMYLASE: 86 U/L (ref 31–124)

## 2017-09-02 LAB — LIPASE: Lipase: 45 U/L (ref 14–72)

## 2017-09-05 ENCOUNTER — Ambulatory Visit: Payer: Federal, State, Local not specified - PPO | Admitting: Family Medicine

## 2017-09-08 ENCOUNTER — Other Ambulatory Visit: Payer: Self-pay | Admitting: Sports Medicine

## 2017-09-08 DIAGNOSIS — M79671 Pain in right foot: Secondary | ICD-10-CM

## 2017-09-08 DIAGNOSIS — M779 Enthesopathy, unspecified: Secondary | ICD-10-CM

## 2017-09-29 ENCOUNTER — Encounter (HOSPITAL_COMMUNITY): Payer: Self-pay | Admitting: *Deleted

## 2017-09-29 ENCOUNTER — Other Ambulatory Visit: Payer: Self-pay

## 2017-09-29 ENCOUNTER — Emergency Department (HOSPITAL_COMMUNITY)
Admission: EM | Admit: 2017-09-29 | Discharge: 2017-09-29 | Disposition: A | Discharge status: Home / Self Care | Payer: Federal, State, Local not specified - PPO | Attending: Emergency Medicine | Admitting: Emergency Medicine

## 2017-09-29 DIAGNOSIS — Z79899 Other long term (current) drug therapy: Secondary | ICD-10-CM | POA: Insufficient documentation

## 2017-09-29 DIAGNOSIS — Y908 Blood alcohol level of 240 mg/100 ml or more: Secondary | ICD-10-CM | POA: Insufficient documentation

## 2017-09-29 DIAGNOSIS — F10920 Alcohol use, unspecified with intoxication, uncomplicated: Secondary | ICD-10-CM | POA: Diagnosis present

## 2017-09-29 LAB — CBC
HCT: 39.2 % (ref 36.0–46.0)
HEMOGLOBIN: 13.5 g/dL (ref 12.0–15.0)
MCH: 33.4 pg (ref 26.0–34.0)
MCHC: 34.4 g/dL (ref 30.0–36.0)
MCV: 97 fL (ref 78.0–100.0)
Platelets: 193 10*3/uL (ref 150–400)
RBC: 4.04 MIL/uL (ref 3.87–5.11)
RDW: 14.6 % (ref 11.5–15.5)
WBC: 5.9 10*3/uL (ref 4.0–10.5)

## 2017-09-29 LAB — COMPREHENSIVE METABOLIC PANEL
ALBUMIN: 4.7 g/dL (ref 3.5–5.0)
ALT: 32 U/L (ref 0–44)
ANION GAP: 13 (ref 5–15)
AST: 106 U/L — AB (ref 15–41)
Alkaline Phosphatase: 59 U/L (ref 38–126)
BILIRUBIN TOTAL: 0.5 mg/dL (ref 0.3–1.2)
BUN: 11 mg/dL (ref 8–23)
CHLORIDE: 104 mmol/L (ref 98–111)
CO2: 27 mmol/L (ref 22–32)
Calcium: 9 mg/dL (ref 8.9–10.3)
Creatinine, Ser: 0.59 mg/dL (ref 0.44–1.00)
GFR calc Af Amer: >60 mL/min (ref >60)
GLUCOSE: 96 mg/dL (ref 70–99)
Potassium: 3.9 mmol/L (ref 3.5–5.1)
Sodium: 144 mmol/L (ref 135–145)
TOTAL PROTEIN: 7.9 g/dL (ref 6.5–8.1)

## 2017-09-29 LAB — ETHANOL: Alcohol, Ethyl (B): 356 mg/dL (ref <10)

## 2017-09-29 MED ORDER — CHLORDIAZEPOXIDE HCL 25 MG PO CAPS: ORAL_CAPSULE | ORAL | 0 refills | Status: DC

## 2017-09-29 NOTE — ED Notes (Signed)
Patient is hard of hearing and forgot her hearing aids. Patient is disoriented to time. Pt stated her last drink was around 10:30 this morning. When reoriented to time, she states her last drink must have been several hours ago. Pt states she last drank a bottle of wine.  Patient states she would like to start taking a benzo daily in order to give up drinking the alcohol. Pt reports withdrawal symptoms of sweating, nausea and vomiting.

## 2017-09-29 NOTE — ED Triage Notes (Signed)
Per EMS pt coming from home wanting alcohol detox. EMS sts pt was drinking heavily yesterday, sheriff's deputies were at her residences and offered her a detox which she refused, but today she decided she needs to come in for detox.

## 2017-09-29 NOTE — Patient Outreach (Signed)
ED Peer Support Specialist Patient Intake (Complete at intake & 30-60 Day Follow-up)  Name: Terri Ayala  MRN: [id number]  Age: 62 y.o.   Date of Admission: 09/29/2017  Intake: Initial Comments:      Primary Reason Admitted: Alcohol withdrawals   Lab values: Alcohol/ETOH: Positive Positive UDS? No Amphetamines: No Barbiturates: No Benzodiazepines: No Cocaine: No Opiates: No Cannabinoids:    Demographic information: Gender: Female Ethnicity: White Marital Status: Unknown Insurance Status: Uninsured/Self-pay Ecologist (Work Neurosurgeon, Physicist, medical, etc.: Yes Lives with: Alone Living situation:    Reported Patient History: Patient reported health conditions: Anxiety disorders, Depression Patient aware of HIV and hepatitis status: No  In past year, has patient visited ED for any reason? Yes  Number of ED visits: 7  Reason(s) for visit:    In past year, has patient been hospitalized for any reason? No  Number of hospitalizations:    Reason(s) for hospitalization:    In past year, has patient been arrested? No  Number of arrests:    Reason(s) for arrest:    In past year, has patient been incarcerated? No  Number of incarcerations:    Reason(s) for incarceration:    In past year, has patient received medication-assisted treatment? No  In past year, patient received the following treatments: ([facility] and other treatment base services. )  In past year, has patient received any harm reduction services? No  Did this include any of the following?    In past year, has patient received care from a mental health provider for diagnosis other than SUD? No  In past year, is this first time patient has overdosed? No  Number of past overdoses:    In past year, is this first time patient has been hospitalized for an overdose? No  Number of hospitalizations for overdose(s):    Is patient currently receiving treatment for a mental  health diagnosis? No  Patient reports experiencing difficulty participating in SUD treatment: No    Most important reason(s) for this difficulty?    Has patient received prior services for treatment? No  In past, patient has received services from following agencies:    Plan of Care:  Suggested follow up at these agencies/treatment centers: (Stated does not want any treatment just wants Medications for [place] )  Other information: CPSS met with Pt and was made aware that she does not want any service or treatment. Pt stated that she just wants Medication for the Withdrawals. CPSS processed with Pt to gain information to better assist Pt and was able to complete series of questions. CPSS left contact information to follow up with Pt.     Aaron Edelman Jeyda Siebel, CPSS  09/29/2017 1:42 PM

## 2017-09-29 NOTE — ED Provider Notes (Signed)
[facility] DEPT Provider Note   CSN: [id number] Arrival date & time: 09/29/17  0998     History   Chief Complaint Chief Complaint  Patient presents with  . Alcohol Problem    HPI Terri Ayala is a 62 y.o. female.  Patient with alcohol intoxication and chronic alcohol abuse. Symptoms persistent, mod-sev, episodic. Pt indicates her partner had called police as was concerned that she had drank too much. Pt reports last drinking 2 hours ago. Pt indicates has been drinking daily for several months - cannot quantify amount. Denies hx complicated etoh withdrawal, dts or seizures. Pt does note recent increased stress with illness of partner/pet. Pt denies depression or thoughts of self harm.  Denies trauma, injury or fall. Denies acute physical illness/symptoms.   The history is provided by the patient.  Alcohol Problem  Pertinent negatives include no chest pain, no abdominal pain, no headaches and no shortness of breath.    Past Medical History:  Diagnosis Date  . Alcoholism ([place])   . Anxiety   . ASYMPTOMATIC POSTMENOPAUSAL STATUS 08/17/2009  . DYSPHAGIA 06/19/2007  . Fatty liver, alcoholic   . GOITER, MULTINODULAR 08/17/2009  . Hyperlipidemia   . Low back pain   . Migraines   . Mitral valve prolapse   . OSTEOPOROSIS 08/17/2009  . PONV (postoperative nausea and vomiting)   . Supraventricular tachycardia [facility])     Patient Active Problem List   Diagnosis Date Noted  . Alcohol withdrawal ([place]) 07/21/2017  . Major depressive disorder, recurrent episode, moderate ([place])   . Alcohol use disorder, severe, dependence ([place]) 04/03/2014  . Lumbosacral radiculopathy at L5 05/20/2013  . Nonspecific elevation of levels of transaminase or lactic acid dehydrogenase (LDH) 03/16/2013  . Hepatic steatosis 03/16/2013  . Chronic cholecystitis 03/14/2013  . Cervical arthritis 10/12/2012  . Palpitations 11/29/2010  . SVT (supraventricular tachycardia) ([place]) 11/29/2010   . Mitral valve prolapse 11/29/2010  . Decreased libido 11/15/2010  . GOITER, MULTINODULAR 08/17/2009  . ANXIETY 08/17/2009  . Migraine headache 08/17/2009  . HEARING LOSS 08/17/2009  . OSTEOPOROSIS 08/17/2009  . ASYMPTOMATIC POSTMENOPAUSAL STATUS 08/17/2009  . DYSPHAGIA 06/19/2007    Past Surgical History:  Procedure Laterality Date  . BREAST BIOPSY Right   . CERVIX SURGERY    . CHOLECYSTECTOMY N/A 03/14/2013   Procedure: LAPAROSCOPIC CHOLECYSTECTOMY WITH ATTEMPTED INTRAOPERATIVE CHOLANGIOGRAM;  Surgeon: Harl Bowie, MD;  Location: [facility];  Service: General;  Laterality: N/A;  . laporoscopic abdominal surgery       OB History   None      Home Medications    Prior to Admission medications   Medication Sig Start Date End Date Taking? Authorizing Provider  alendronate (FOSAMAX) 70 MG tablet Take 70 mg by mouth once a week. Take with a full glass of water on an empty stomach.    [provider]  busPIRone (BUSPAR) 15 MG tablet Take 15 mg by mouth 3 (three) times daily.    [provider]  DULoxetine (CYMBALTA) 60 MG capsule TAKE 1 CAPSULE (60 MG TOTAL) BY MOUTH DAILY. 10/23/14   Denita Lung, MD  hydrOXYzine (ATARAX/VISTARIL) 10 MG tablet Take 10 mg by mouth 2 (two) times daily. 07/10/17   [provider]  meloxicam (MOBIC) 15 MG tablet TAKE 1 TABLET BY MOUTH EVERY DAY 09/08/17   Landis Martins, DPM  naltrexone (DEPADE) 50 MG tablet Take 50 mg by mouth daily. 07/10/17   [provider]  norethindrone-ethinyl estradiol (FEMHRT 1/5) 1-5  MG-MCG TABS Take 1 tablet by mouth daily. For Osteoporosis 04/07/14   Lindell Spar I, NP  ondansetron (ZOFRAN ODT) 4 MG disintegrating tablet Take 1 tablet (4 mg total) by mouth every 8 (eight) hours as needed for nausea or vomiting. 09/01/17   Henson, Vickie L, NP-C  propranolol (INDERAL) 10 MG tablet Take 20 mg by mouth 2 (two) times daily.     [provider]  sulfamethoxazole-trimethoprim (BACTRIM  DS,SEPTRA DS) 800-160 MG tablet Take 1 tablet by mouth 2 (two) times daily. 08/28/17   Denita Lung, MD  SUMAtriptan (IMITREX) 100 MG tablet Take 1 tablet at the first sign of migraine.  May repeat in 2 hours. 07/31/17   Denita Lung, MD  traZODone (DESYREL) 50 MG tablet Take 50 mg by mouth at bedtime as needed for sleep.     [provider]    Family History Family History  Problem Relation Age of Onset  . Colon cancer Father        Deceased, 18  . Osteoporosis Mother        Living, 45  . Healthy Brother   . Thyroid disease Neg Hx   . Goiter Neg Hx     Social History Social History   Tobacco Use  . Smoking status: Never Smoker  . Smokeless tobacco: Never Used  Substance Use Topics  . Alcohol use: Yes    Comment: bottle of wine daily  . Drug use: No     Allergies   Codeine; Doxycycline; and Tetracycline hcl   Review of Systems Review of Systems  Constitutional: Negative for fever.  HENT: Negative for sore throat.   Eyes: Negative for visual disturbance.  Respiratory: Negative for shortness of breath.   Cardiovascular: Negative for chest pain.  Gastrointestinal: Negative for abdominal pain and vomiting.  Genitourinary: Negative for flank pain.  Musculoskeletal: Negative for back pain and neck pain.  Skin: Negative for rash.  Neurological: Negative for headaches.  Hematological: Does not bruise/bleed easily.  Psychiatric/Behavioral: Negative for confusion and suicidal ideas.     Physical Exam Updated Vital Signs BP 129/81   Pulse 93   Temp 98.1 F (36.7 C) (Oral)   Resp 15   Ht 1.575 m (5\' 2" )   Wt 59 kg (130 lb)   SpO2 100%   BMI 23.78 kg/m   Physical Exam  Constitutional: She appears well-developed and well-nourished.  HENT:  Mouth/Throat: Oropharynx is clear and moist.  Eyes: Conjunctivae are normal. No scleral icterus.  Neck: Neck supple. No tracheal deviation present.  Cardiovascular: Normal rate, regular rhythm, normal heart sounds  and intact distal pulses.  Pulmonary/Chest: Effort normal and breath sounds normal. No respiratory distress.  Abdominal: Soft. Normal appearance and bowel sounds are normal. She exhibits no distension. There is no tenderness.  Musculoskeletal: She exhibits no edema.  Neurological: She is alert.  Speech clear. Motor/sens grossly intact bil. Steady gait.   Skin: Skin is warm and dry. No rash noted.  Psychiatric:  Denies SI.   Nursing note and vitals reviewed.    ED Treatments / Results  Labs (all labs ordered are listed, but only abnormal results are displayed) Results for orders placed or performed during the hospital encounter of 09/29/17  Comprehensive metabolic panel  Result Value Ref Range   Sodium 144 135 - 145 mmol/L   Potassium 3.9 3.5 - 5.1 mmol/L   Chloride 104 98 - 111 mmol/L   CO2 27 22 - 32 mmol/L   Glucose, Bld  96 70 - 99 mg/dL   BUN 11 8 - 23 mg/dL   Creatinine, Ser 0.59 0.44 - 1.00 mg/dL   Calcium 9.0 8.9 - 10.3 mg/dL   Total Protein 7.9 6.5 - 8.1 g/dL   Albumin 4.7 3.5 - 5.0 g/dL   AST 106 (H) 15 - 41 U/L   ALT 32 0 - 44 U/L   Alkaline Phosphatase 59 38 - 126 U/L   Total Bilirubin 0.5 0.3 - 1.2 mg/dL   GFR calc non Af Amer >60 >60 mL/min   GFR calc Af Amer >60 >60 mL/min   Anion gap 13 5 - 15  CBC  Result Value Ref Range   WBC 5.9 4.0 - 10.5 K/uL   RBC 4.04 3.87 - 5.11 MIL/uL   Hemoglobin 13.5 12.0 - 15.0 g/dL   HCT 39.2 36.0 - 46.0 %   MCV 97.0 78.0 - 100.0 fL   MCH 33.4 26.0 - 34.0 pg   MCHC 34.4 30.0 - 36.0 g/dL   RDW 14.6 11.5 - 15.5 %   Platelets 193 150 - 400 K/uL  Ethanol  Result Value Ref Range   Alcohol, Ethyl (B) 356 (HH) <10 mg/dL    EKG None  Radiology No results found.  Procedures Procedures (including critical care time)  Medications Ordered in ED Medications - No data to display   Initial Impression / Assessment and Plan / ED Course  I have reviewed the triage vital signs and the nursing notes.  Pertinent labs & imaging  results that were available during my care of the patient were reviewed by me and considered in my medical decision making (see chart for details).  Labs.   Po fluids, meal.   Reviewed nursing notes and prior charts for additional history.   Pt observed in ED x several hours until clinically sober. She is awake and alert. Cooperative/conversant. Pt has normal mood and affect. No SI.  No tremor or shakes. Vitals normal. Pt is ambulatory w steady gait. Has eaten/drank.   Pt currently appears stable for d/c.    Final Clinical Impressions(s) / ED Diagnoses   Final diagnoses:  None    ED Discharge Orders    None       Lajean Saver, MD 09/29/17 [phone]

## 2017-09-29 NOTE — ED Notes (Signed)
Ambulated Pt in hallway on monitor. Pt walked briskly without difficulty. Pt denied dizziness, and had no abnormal gait.

## 2017-09-29 NOTE — Discharge Instructions (Addendum)
It was our pleasure to provide your ER care today - we hope that you feel better.  Follow up with AA, Peer support, and use resource guide provided for additional community resources.   Avoid drinking any alcohol, and never drink and drive.   Also follow up with primary care doctor in the next 1-2 weeks.

## 2017-09-29 NOTE — ED Notes (Signed)
Bed: WA20 Expected date:  Expected time:  Means of arrival:  Comments: EMS 62 yo female wants detox [facility]

## 2017-10-17 ENCOUNTER — Telehealth: Payer: Self-pay

## 2017-10-17 NOTE — Telephone Encounter (Signed)
Find out what we need to do and go ahead and do it

## 2017-10-17 NOTE — Telephone Encounter (Signed)
Pt stated she will need to be referred to [facility] to see Dr. Lynder Parents. Please advise patient.

## 2017-10-22 ENCOUNTER — Other Ambulatory Visit: Payer: Self-pay | Admitting: Sports Medicine

## 2017-10-22 DIAGNOSIS — M79671 Pain in right foot: Secondary | ICD-10-CM

## 2017-10-22 DIAGNOSIS — M779 Enthesopathy, unspecified: Secondary | ICD-10-CM

## 2017-10-23 DIAGNOSIS — F3341 Major depressive disorder, recurrent, in partial remission: Secondary | ICD-10-CM | POA: Diagnosis not present

## 2017-10-23 NOTE — Telephone Encounter (Signed)
Pt was called to advise [facility] says all she needs to do is to call their office and make a appt. . Left phone number on voice mail [facility]

## 2017-10-31 ENCOUNTER — Telehealth: Payer: Self-pay | Admitting: *Deleted

## 2017-10-31 NOTE — Telephone Encounter (Signed)
Refill request for meloxicam. Dr. Cannon Kettle states pt needs an appt. Return fax denying.

## 2017-11-06 ENCOUNTER — Inpatient Hospital Stay (HOSPITAL_COMMUNITY)
Admission: EM | Admit: 2017-11-06 | Discharge: 2017-11-09 | DRG: 897 | Disposition: A | Discharge status: Home Health | Payer: Federal, State, Local not specified - PPO | Attending: Internal Medicine | Admitting: Internal Medicine

## 2017-11-06 ENCOUNTER — Emergency Department (HOSPITAL_COMMUNITY): Payer: Federal, State, Local not specified - PPO

## 2017-11-06 ENCOUNTER — Encounter (HOSPITAL_COMMUNITY): Payer: Self-pay | Admitting: Emergency Medicine

## 2017-11-06 DIAGNOSIS — I471 Supraventricular tachycardia, unspecified: Secondary | ICD-10-CM | POA: Diagnosis present

## 2017-11-06 DIAGNOSIS — R000 Tachycardia, unspecified: Secondary | ICD-10-CM | POA: Diagnosis not present

## 2017-11-06 DIAGNOSIS — R0602 Shortness of breath: Secondary | ICD-10-CM | POA: Diagnosis not present

## 2017-11-06 DIAGNOSIS — F331 Major depressive disorder, recurrent, moderate: Secondary | ICD-10-CM | POA: Diagnosis present

## 2017-11-06 DIAGNOSIS — R079 Chest pain, unspecified: Secondary | ICD-10-CM | POA: Diagnosis not present

## 2017-11-06 DIAGNOSIS — K2920 Alcoholic gastritis without bleeding: Secondary | ICD-10-CM | POA: Diagnosis present

## 2017-11-06 DIAGNOSIS — F10231 Alcohol dependence with withdrawal delirium: Principal | ICD-10-CM | POA: Diagnosis present

## 2017-11-06 DIAGNOSIS — F10230 Alcohol dependence with withdrawal, uncomplicated: Secondary | ICD-10-CM | POA: Diagnosis not present

## 2017-11-06 DIAGNOSIS — D696 Thrombocytopenia, unspecified: Secondary | ICD-10-CM | POA: Diagnosis present

## 2017-11-06 DIAGNOSIS — E876 Hypokalemia: Secondary | ICD-10-CM | POA: Diagnosis present

## 2017-11-06 DIAGNOSIS — Y903 Blood alcohol level of 60-79 mg/100 ml: Secondary | ICD-10-CM | POA: Diagnosis present

## 2017-11-06 DIAGNOSIS — F1020 Alcohol dependence, uncomplicated: Secondary | ICD-10-CM | POA: Diagnosis present

## 2017-11-06 DIAGNOSIS — F10239 Alcohol dependence with withdrawal, unspecified: Secondary | ICD-10-CM | POA: Diagnosis present

## 2017-11-06 DIAGNOSIS — F322 Major depressive disorder, single episode, severe without psychotic features: Secondary | ICD-10-CM | POA: Diagnosis not present

## 2017-11-06 DIAGNOSIS — F10939 Alcohol use, unspecified with withdrawal, unspecified: Secondary | ICD-10-CM | POA: Diagnosis present

## 2017-11-06 DIAGNOSIS — Z23 Encounter for immunization: Secondary | ICD-10-CM | POA: Diagnosis not present

## 2017-11-06 DIAGNOSIS — R0789 Other chest pain: Secondary | ICD-10-CM | POA: Diagnosis not present

## 2017-11-06 DIAGNOSIS — R52 Pain, unspecified: Secondary | ICD-10-CM | POA: Diagnosis not present

## 2017-11-06 LAB — CBC
HEMATOCRIT: 43.5 % (ref 36.0–46.0)
HEMOGLOBIN: 14.5 g/dL (ref 12.0–15.0)
MCH: 34.1 pg — ABNORMAL HIGH (ref 26.0–34.0)
MCHC: 33.3 g/dL (ref 30.0–36.0)
MCV: 102.4 fL — AB (ref 78.0–100.0)
Platelets: 83 10*3/uL — ABNORMAL LOW (ref 150–400)
RBC: 4.25 MIL/uL (ref 3.87–5.11)
RDW: 15.9 % — ABNORMAL HIGH (ref 11.5–15.5)
WBC: 8.9 10*3/uL (ref 4.0–10.5)

## 2017-11-06 LAB — COMPREHENSIVE METABOLIC PANEL
ALT: 9 U/L (ref 0–44)
ANION GAP: 12 (ref 5–15)
AST: 31 U/L (ref 15–41)
Albumin: 3.6 g/dL (ref 3.5–5.0)
Alkaline Phosphatase: 78 U/L (ref 38–126)
BILIRUBIN TOTAL: 0.9 mg/dL (ref 0.3–1.2)
BUN: 11 mg/dL (ref 8–23)
CO2: 21 mmol/L — ABNORMAL LOW (ref 22–32)
Calcium: 7.9 mg/dL — ABNORMAL LOW (ref 8.9–10.3)
Chloride: 109 mmol/L (ref 98–111)
Creatinine, Ser: 0.61 mg/dL (ref 0.44–1.00)
GFR calc Af Amer: >60 mL/min (ref >60)
GFR calc non Af Amer: >60 mL/min (ref >60)
Glucose, Bld: 88 mg/dL (ref 70–99)
POTASSIUM: 4.5 mmol/L (ref 3.5–5.1)
Sodium: 142 mmol/L (ref 135–145)
Total Protein: 6.5 g/dL (ref 6.5–8.1)

## 2017-11-06 LAB — MAGNESIUM: Magnesium: 1.7 mg/dL (ref 1.7–2.4)

## 2017-11-06 LAB — I-STAT TROPONIN, ED: TROPONIN I, POC: 0.03 ng/mL (ref 0.00–0.08)

## 2017-11-06 LAB — ETHANOL: Alcohol, Ethyl (B): 68 mg/dL — ABNORMAL HIGH (ref <10)

## 2017-11-06 MED ORDER — THIAMINE HCL 100 MG/ML IJ SOLN
100.0000 mg | Freq: Every day | INTRAMUSCULAR | Status: DC
  Administered 2017-11-07: 100 mg via INTRAVENOUS
  Filled 2017-11-06 (×3): qty 2

## 2017-11-06 MED ORDER — CHLORDIAZEPOXIDE HCL 25 MG PO CAPS
100.0000 mg | ORAL_CAPSULE | Freq: Once | ORAL | Status: AC
  Administered 2017-11-06: 100 mg via ORAL
  Filled 2017-11-06: qty 4

## 2017-11-06 MED ORDER — DULOXETINE HCL 60 MG PO CPEP
60.0000 mg | ORAL_CAPSULE | Freq: Every day | ORAL | Status: DC
  Administered 2017-11-07 – 2017-11-09 (×3): 60 mg via ORAL
  Filled 2017-11-06 (×4): qty 1

## 2017-11-06 MED ORDER — LORAZEPAM 2 MG/ML IJ SOLN
1.0000 mg | Freq: Once | INTRAMUSCULAR | Status: DC
  Filled 2017-11-06: qty 1

## 2017-11-06 MED ORDER — LORAZEPAM 2 MG/ML IJ SOLN
4.0000 mg | Freq: Once | INTRAMUSCULAR | Status: AC
  Administered 2017-11-06: 4 mg via INTRAVENOUS
  Filled 2017-11-06: qty 2

## 2017-11-06 MED ORDER — LORAZEPAM 2 MG/ML IJ SOLN
1.0000 mg | Freq: Once | INTRAMUSCULAR | Status: AC
  Administered 2017-11-06: 1 mg via INTRAVENOUS
  Filled 2017-11-06: qty 1

## 2017-11-06 MED ORDER — HYDROXYZINE HCL 10 MG PO TABS
10.0000 mg | ORAL_TABLET | Freq: Two times a day (BID) | ORAL | Status: DC
  Administered 2017-11-07 – 2017-11-09 (×6): 10 mg via ORAL
  Filled 2017-11-06 (×6): qty 1

## 2017-11-06 MED ORDER — ONDANSETRON HCL 4 MG PO TABS: 4.0000 mg | ORAL_TABLET | Freq: Four times a day (QID) | ORAL | Status: DC | PRN

## 2017-11-06 MED ORDER — SODIUM CHLORIDE 0.9 % IV BOLUS
1000.0000 mL | Freq: Once | INTRAVENOUS | Status: AC
  Administered 2017-11-06: 1000 mL via INTRAVENOUS

## 2017-11-06 MED ORDER — ONDANSETRON HCL 4 MG/2ML IJ SOLN: 4.0000 mg | Freq: Four times a day (QID) | INTRAMUSCULAR | Status: DC | PRN

## 2017-11-06 MED ORDER — ADULT MULTIVITAMIN W/MINERALS CH
1.0000 | ORAL_TABLET | Freq: Every day | ORAL | Status: DC
  Administered 2017-11-07 – 2017-11-09 (×3): 1 via ORAL
  Filled 2017-11-06 (×3): qty 1

## 2017-11-06 MED ORDER — PROPRANOLOL HCL 10 MG PO TABS
10.0000 mg | ORAL_TABLET | Freq: Two times a day (BID) | ORAL | Status: DC
  Administered 2017-11-07 – 2017-11-09 (×6): 10 mg via ORAL
  Filled 2017-11-06 (×6): qty 1

## 2017-11-06 MED ORDER — ACETAMINOPHEN 650 MG RE SUPP: 650.0000 mg | Freq: Four times a day (QID) | RECTAL | Status: DC | PRN

## 2017-11-06 MED ORDER — VITAMIN B-1 100 MG PO TABS
100.0000 mg | ORAL_TABLET | Freq: Every day | ORAL | Status: DC
  Administered 2017-11-08 – 2017-11-09 (×2): 100 mg via ORAL
  Filled 2017-11-06 (×2): qty 1

## 2017-11-06 MED ORDER — POLYETHYLENE GLYCOL 3350 17 G PO PACK: 17.0000 g | PACK | Freq: Every day | ORAL | Status: DC | PRN

## 2017-11-06 MED ORDER — LORAZEPAM 2 MG/ML IJ SOLN
0.0000 mg | Freq: Four times a day (QID) | INTRAMUSCULAR | Status: AC
  Administered 2017-11-07: 2 mg via INTRAVENOUS
  Administered 2017-11-07: 1 mg via INTRAVENOUS
  Administered 2017-11-08 (×2): 2 mg via INTRAVENOUS
  Filled 2017-11-06 (×4): qty 1

## 2017-11-06 MED ORDER — LORAZEPAM 2 MG/ML IJ SOLN
2.0000 mg | Freq: Once | INTRAMUSCULAR | Status: AC
  Administered 2017-11-06: 2 mg via INTRAVENOUS

## 2017-11-06 MED ORDER — ACETAMINOPHEN 325 MG PO TABS: 650.0000 mg | ORAL_TABLET | Freq: Four times a day (QID) | ORAL | Status: DC | PRN

## 2017-11-06 MED ORDER — FOLIC ACID 1 MG PO TABS
1.0000 mg | ORAL_TABLET | Freq: Every day | ORAL | Status: DC
  Administered 2017-11-07 – 2017-11-09 (×3): 1 mg via ORAL
  Filled 2017-11-06 (×3): qty 1

## 2017-11-06 MED ORDER — DEXTROSE-NACL 5-0.45 % IV SOLN
INTRAVENOUS | Status: DC
  Administered 2017-11-07: 02:00:00 via INTRAVENOUS

## 2017-11-06 MED ORDER — LORAZEPAM 2 MG/ML IJ SOLN: 1.0000 mg | Freq: Four times a day (QID) | INTRAMUSCULAR | Status: DC | PRN

## 2017-11-06 MED ORDER — LORAZEPAM 2 MG/ML IJ SOLN: 0.0000 mg | Freq: Two times a day (BID) | INTRAMUSCULAR | Status: DC

## 2017-11-06 MED ORDER — LORAZEPAM 1 MG PO TABS
1.0000 mg | ORAL_TABLET | Freq: Four times a day (QID) | ORAL | Status: DC | PRN
  Administered 2017-11-07 – 2017-11-09 (×2): 1 mg via ORAL
  Filled 2017-11-06 (×2): qty 1

## 2017-11-06 NOTE — ED Triage Notes (Signed)
Pt arrives via EMS with complaints of withdrawal symptoms, shaking and palpitations. Hx of SVT but has not taken medications since yesterday. Pt reports last drink 3 am today. EMS reports HR in 180-190s, 6 adenosine given with no change, then another 12 given and HR in 140s since.

## 2017-11-06 NOTE — ED Notes (Signed)
ED Provider at bedside. 

## 2017-11-06 NOTE — ED Provider Notes (Signed)
[facility] EMERGENCY DEPARTMENT Provider Note   CSN: [id number] Arrival date & time: 11/06/17  [place]     History   Chief Complaint Chief Complaint  Patient presents with  . Withdrawal    HPI Terri Ayala is a 62 y.o. female.  Patient drinks 2-3 bottles of wine every day and ran out of wine this morning.  She reports around mid morning, she began having pain and shortness of breath.  She called EMS who noted SVT.  The patient has had a previous episode of SVT and asked for no cardioversion.  She was given 6 of adenosine with no effect and then 12 adenosine and converted to sinus tachycardia.  The history is provided by the patient.  Illness  This is a new problem. The current episode started 6 to 12 hours ago. The problem occurs constantly. The problem has been gradually worsening. Associated symptoms include chest pain and shortness of breath. Pertinent negatives include no abdominal pain. The symptoms are aggravated by stress. Nothing relieves the symptoms. She has tried nothing for the symptoms.    Past Medical History:  Diagnosis Date  . Alcoholism ([place])   . Anxiety   . ASYMPTOMATIC POSTMENOPAUSAL STATUS 08/17/2009  . DYSPHAGIA 06/19/2007  . Fatty liver, alcoholic   . GOITER, MULTINODULAR 08/17/2009  . Hyperlipidemia   . Low back pain   . Migraines   . Mitral valve prolapse   . OSTEOPOROSIS 08/17/2009  . PONV (postoperative nausea and vomiting)   . Supraventricular tachycardia [facility])     Patient Active Problem List   Diagnosis Date Noted  . Alcohol withdrawal ([place]) 07/21/2017  . Major depressive disorder, recurrent episode, moderate ([place])   . Alcohol use disorder, severe, dependence ([place]) 04/03/2014  . Lumbosacral radiculopathy at L5 05/20/2013  . Nonspecific elevation of levels of transaminase or lactic acid dehydrogenase (LDH) 03/16/2013  . Hepatic steatosis 03/16/2013  . Chronic cholecystitis 03/14/2013  . Cervical arthritis 10/12/2012  .  Palpitations 11/29/2010  . SVT (supraventricular tachycardia) ([place]) 11/29/2010  . Mitral valve prolapse 11/29/2010  . Decreased libido 11/15/2010  . GOITER, MULTINODULAR 08/17/2009  . ANXIETY 08/17/2009  . Migraine headache 08/17/2009  . HEARING LOSS 08/17/2009  . OSTEOPOROSIS 08/17/2009  . ASYMPTOMATIC POSTMENOPAUSAL STATUS 08/17/2009  . DYSPHAGIA 06/19/2007    Past Surgical History:  Procedure Laterality Date  . BREAST BIOPSY Right   . CERVIX SURGERY    . CHOLECYSTECTOMY N/A 03/14/2013   Procedure: LAPAROSCOPIC CHOLECYSTECTOMY WITH ATTEMPTED INTRAOPERATIVE CHOLANGIOGRAM;  Surgeon: Harl Bowie, MD;  Location: [facility];  Service: General;  Laterality: N/A;  . laporoscopic abdominal surgery       OB History   None      Home Medications    Prior to Admission medications   Medication Sig Start Date End Date Taking? Authorizing Provider  alendronate (FOSAMAX) 70 MG tablet Take 70 mg by mouth once a week. Take with a full glass of water on an empty stomach.    [provider]  busPIRone (BUSPAR) 15 MG tablet Take 15 mg by mouth 3 (three) times daily.    [provider]  chlordiazePOXIDE (LIBRIUM) 25 MG capsule 25 mg PO TID x 1D, then 25 mg PO BID X 1D, then 25 mg PO QD X 1D 09/29/17   Lajean Saver, MD  DULoxetine (CYMBALTA) 60 MG capsule TAKE 1 CAPSULE (60 MG TOTAL) BY MOUTH DAILY. 10/23/14   Denita Lung, MD  hydrOXYzine (ATARAX/VISTARIL) 10 MG tablet Take 10  mg by mouth 2 (two) times daily. 07/10/17   [provider]  naltrexone (DEPADE) 50 MG tablet Take 50 mg by mouth daily. 07/10/17   [provider]  norethindrone-ethinyl estradiol (FEMHRT 1/5) 1-5 MG-MCG TABS Take 1 tablet by mouth daily. For Osteoporosis 04/07/14   Lindell Spar I, NP  ondansetron (ZOFRAN ODT) 4 MG disintegrating tablet Take 1 tablet (4 mg total) by mouth every 8 (eight) hours as needed for nausea or vomiting. Patient not taking: Reported on 09/29/2017 09/01/17   Harland Dingwall L, NP-C  propranolol (INDERAL) 10 MG tablet Take 20 mg by mouth 2 (two) times daily.     [provider]  sulfamethoxazole-trimethoprim (BACTRIM DS,SEPTRA DS) 800-160 MG tablet Take 1 tablet by mouth 2 (two) times daily. Patient not taking: Reported on 09/29/2017 08/28/17   Denita Lung, MD  SUMAtriptan (IMITREX) 100 MG tablet Take 1 tablet at the first sign of migraine.  May repeat in 2 hours. 07/31/17   Denita Lung, MD  traZODone (DESYREL) 50 MG tablet Take 50 mg by mouth at bedtime as needed for sleep.     [provider]    Family History Family History  Problem Relation Age of Onset  . Colon cancer Father        Deceased, 44  . Osteoporosis Mother        Living, 32  . Healthy Brother   . Thyroid disease Neg Hx   . Goiter Neg Hx     Social History Social History   Tobacco Use  . Smoking status: Never Smoker  . Smokeless tobacco: Never Used  Substance Use Topics  . Alcohol use: Yes    Comment: bottle of wine daily  . Drug use: No     Allergies   Doxycycline; Codeine; and Tetracycline hcl   Review of Systems Review of Systems  Constitutional: Negative for chills and fever.  HENT: Negative for ear pain and sore throat.   Eyes: Negative for pain and visual disturbance.  Respiratory: Positive for shortness of breath. Negative for cough.   Cardiovascular: Positive for chest pain. Negative for palpitations.  Gastrointestinal: Negative for abdominal pain and vomiting.  Genitourinary: Negative for dysuria and hematuria.  Musculoskeletal: Negative for arthralgias and back pain.  Skin: Negative for color change and rash.  Neurological: Negative for seizures and syncope.  All other systems reviewed and are negative.    Physical Exam Updated Vital Signs BP 122/84   Pulse (!) 115   Temp 98.3 F (36.8 C) (Oral)   Resp (!) 22   SpO2 96%   Physical Exam  Constitutional: She appears well-developed and well-nourished. No distress.  HENT:    Head: Normocephalic and atraumatic.  Eyes: Conjunctivae are normal.  Neck: Neck supple.  Cardiovascular: Regular rhythm and intact distal pulses. Tachycardia present.  No murmur heard. Pulmonary/Chest: Effort normal and breath sounds normal. No respiratory distress. She has no wheezes.  Abdominal: Soft. There is no tenderness.  Musculoskeletal: She exhibits no edema.  Neurological: She is alert.  Skin: Skin is warm and dry.  Psychiatric: She has a normal mood and affect.  Nursing note and vitals reviewed.    ED Treatments / Results  Labs (all labs ordered are listed, but only abnormal results are displayed) Labs Reviewed  CBC - Abnormal; Notable for the following components:      Result Value   MCV 102.4 (*)    MCH 34.1 (*)    RDW 15.9 (*)  Platelets 83 (*)    All other components within normal limits  ETHANOL - Abnormal; Notable for the following components:   Alcohol, Ethyl (B) 68 (*)    All other components within normal limits  COMPREHENSIVE METABOLIC PANEL - Abnormal; Notable for the following components:   CO2 21 (*)    Calcium 7.9 (*)    All other components within normal limits  RAPID URINE DRUG SCREEN, HOSP PERFORMED - Abnormal; Notable for the following components:   Benzodiazepines POSITIVE (*)    All other components within normal limits  MAGNESIUM  TROPONIN I  TROPONIN I  TROPONIN I  TSH  HIV ANTIBODY (ROUTINE TESTING)  CBC  I-STAT TROPONIN, ED    EKG None  Radiology Dg Chest Port 1 View  Result Date: 11/06/2017 CLINICAL DATA:  Shortness of breath and tachycardia EXAM: PORTABLE CHEST 1 VIEW COMPARISON:  CT chest 03/07/2014, radiograph 02/27/2014 FINDINGS: The heart size and mediastinal contours are within normal limits. Both lungs are clear. The visualized skeletal structures are unremarkable. IMPRESSION: No active disease. Electronically Signed   By: Donavan Foil M.D.   On: 11/06/2017 17:42    Procedures Procedures (including critical care  time)  Medications Ordered in ED Medications  acetaminophen (TYLENOL) tablet 650 mg (has no administration in time range)    Or  acetaminophen (TYLENOL) suppository 650 mg (has no administration in time range)  ondansetron (ZOFRAN) tablet 4 mg (has no administration in time range)    Or  ondansetron (ZOFRAN) injection 4 mg (has no administration in time range)  polyethylene glycol (MIRALAX / GLYCOLAX) packet 17 g (has no administration in time range)  LORazepam (ATIVAN) tablet 1 mg (has no administration in time range)    Or  LORazepam (ATIVAN) injection 1 mg (has no administration in time range)  thiamine (VITAMIN B-1) tablet 100 mg (has no administration in time range)    Or  thiamine (B-1) injection 100 mg (has no administration in time range)  folic acid (FOLVITE) tablet 1 mg (has no administration in time range)  multivitamin with minerals tablet 1 tablet (has no administration in time range)  LORazepam (ATIVAN) injection 0-4 mg (2 mg Intravenous Given 11/07/17 0140)    Followed by  LORazepam (ATIVAN) injection 0-4 mg (has no administration in time range)  dextrose 5 %-0.45 % sodium chloride infusion ( Intravenous New Bag/Given 11/07/17 0138)  DULoxetine (CYMBALTA) DR capsule 60 mg (has no administration in time range)  hydrOXYzine (ATARAX/VISTARIL) tablet 10 mg (has no administration in time range)  propranolol (INDERAL) tablet 10 mg (10 mg Oral Given 11/07/17 0138)  busPIRone (BUSPAR) tablet 15 mg (has no administration in time range)  LORazepam (ATIVAN) injection 1 mg (1 mg Intravenous Given 11/06/17 1710)  sodium chloride 0.9 % bolus 1,000 mL (0 mLs Intravenous Stopped 11/06/17 1945)  chlordiazePOXIDE (LIBRIUM) capsule 100 mg (100 mg Oral Given 11/06/17 1724)  LORazepam (ATIVAN) injection 2 mg (2 mg Intravenous Given 11/06/17 1829)  LORazepam (ATIVAN) injection 4 mg (4 mg Intravenous Given 11/06/17 2123)  sodium chloride 0.9 % bolus 1,000 mL (0 mLs Intravenous Stopped 11/07/17 0023)      Initial Impression / Assessment and Plan / ED Course  I have reviewed the triage vital signs and the nursing notes.  Pertinent labs & imaging results that were available during my care of the patient were reviewed by me and considered in my medical decision making (see chart for details).     The patient is a 35yoF with PMH  EtOH use disorder who presents with tachycardia. The patient was found to be in SVT by EMS and was given adenosine and converted to sinus tachycardia. The patient reports that she feels like she is having alcohol withdrawals. The patient ran out of alcohol today and would like to quit. She also has been so tremulous that she has not been able to take her medications. CIWA above 20, so patient given ativan and patient reports feeling significantly improved. Despite escalating doses of ativan, librium, and IV fluids, unable to control patient's heart rate. Do not suspect infection as patient is afebrile, CBC shows no leukocytosis. CMP shows no electrolyte abnormalities. Patient admitted for alcohol withdrawal.  Patient care supervised by Dr. Tyrone Nine.  Irven Baltimore, MD  Final Clinical Impressions(s) / ED Diagnoses   Final diagnoses:  Tachycardia    ED Discharge Orders    None       Irven Baltimore, MD 11/07/17 0230    Deno Etienne, DO 11/07/17 1642

## 2017-11-06 NOTE — H&P (Signed)
History and Physical    Terri Ayala:[id number] DOB: April 29, 1955 DOA: 11/06/2017  PCP: Denita Lung, MD   Patient coming from: Home   Chief Complaint: Alcohol withdrawal  HPI: Terri Ayala is a 62 y.o. female with medical history significant for alcohol abuse, SVT, depression, presented to the ED with reports of alcohol withdrawal symptoms-drenching sweats, tremors.  She reports last alcoholic drink was about 3 AM this morning.  She drinks wine, 2-3 bottles daily.  Reports she is ready to quit alcohol abuse, reports she has tried meetings, behavioral health in the recent past.  Patient reports history of alcohol withdrawal in the past, but denies seizure episodes.  She denies visual or auditory hallucinations at this time. She reports mid central chest pain, nonradiating described as a pressure and burning.  Remote history of smoking.  Denies family history of premature CAD.   On EMS arrival on scene, and was in SVT, 170s to 180s, 6mg  adenosine was given without effect, 2012 mg adenosine given and patient converted to sinus tachycardia.   ED Course: Initial tachycardia 135 improved to 115, blood pressure systolic greater than 778, patient was placed on 2 L nasal cannula O2 sats greater than 93%.  Chest x-ray negative for acute abnormality.  Platelets 283.  Elevated alcohol level 68.  I-STAT troponin negative.  1 L bolus, chlordiazepoxide 100 mg X 1, 4 mg Ativan given in ED with improvement in symptoms.  Hospitalist called to admit for alcohol withdrawal.  Review of Systems: As per HPI all other systems reviewed and negative  Past Medical History:  Diagnosis Date  . Alcoholism ([place])   . Anxiety   . ASYMPTOMATIC POSTMENOPAUSAL STATUS 08/17/2009  . DYSPHAGIA 06/19/2007  . Fatty liver, alcoholic   . GOITER, MULTINODULAR 08/17/2009  . Hyperlipidemia   . Low back pain   . Migraines   . Mitral valve prolapse   . OSTEOPOROSIS 08/17/2009  . PONV (postoperative nausea and vomiting)   .  Supraventricular tachycardia [facility])     Past Surgical History:  Procedure Laterality Date  . BREAST BIOPSY Right   . CERVIX SURGERY    . CHOLECYSTECTOMY N/A 03/14/2013   Procedure: LAPAROSCOPIC CHOLECYSTECTOMY WITH ATTEMPTED INTRAOPERATIVE CHOLANGIOGRAM;  Surgeon: Harl Bowie, MD;  Location: [facility];  Service: General;  Laterality: N/A;  . laporoscopic abdominal surgery       reports that she has never smoked. She has never used smokeless tobacco. She reports that she drinks alcohol. She reports that she does not use drugs.  Allergies  Allergen Reactions  . Codeine Itching  . Doxycycline Other (See Comments)    Reaction unknown  . Tetracycline Hcl Other (See Comments)    Reaction unknown    Family History  Problem Relation Age of Onset  . Colon cancer Father        Deceased, 61  . Osteoporosis Mother        Living, 42  . Healthy Brother   . Thyroid disease Neg Hx   . Goiter Neg Hx     Prior to Admission medications   Medication Sig Start Date End Date Taking? Authorizing Provider  alendronate (FOSAMAX) 70 MG tablet Take 70 mg by mouth once a week. Take with a full glass of water on an empty stomach.    [provider]  busPIRone (BUSPAR) 15 MG tablet Take 15 mg by mouth 3 (three) times daily.    [provider]  chlordiazePOXIDE (LIBRIUM) 25 MG capsule 25 mg  PO TID x 1D, then 25 mg PO BID X 1D, then 25 mg PO QD X 1D 09/29/17   Lajean Saver, MD  DULoxetine (CYMBALTA) 60 MG capsule TAKE 1 CAPSULE (60 MG TOTAL) BY MOUTH DAILY. 10/23/14   Denita Lung, MD  hydrOXYzine (ATARAX/VISTARIL) 10 MG tablet Take 10 mg by mouth 2 (two) times daily. 07/10/17   [provider]  naltrexone (DEPADE) 50 MG tablet Take 50 mg by mouth daily. 07/10/17   [provider]  norethindrone-ethinyl estradiol (FEMHRT 1/5) 1-5 MG-MCG TABS Take 1 tablet by mouth daily. For Osteoporosis 04/07/14   Lindell Spar I, NP  ondansetron (ZOFRAN ODT) 4 MG disintegrating tablet  Take 1 tablet (4 mg total) by mouth every 8 (eight) hours as needed for nausea or vomiting. Patient not taking: Reported on 09/29/2017 09/01/17   Harland Dingwall L, NP-C  propranolol (INDERAL) 10 MG tablet Take 20 mg by mouth 2 (two) times daily.     [provider]  sulfamethoxazole-trimethoprim (BACTRIM DS,SEPTRA DS) 800-160 MG tablet Take 1 tablet by mouth 2 (two) times daily. Patient not taking: Reported on 09/29/2017 08/28/17   Denita Lung, MD  SUMAtriptan (IMITREX) 100 MG tablet Take 1 tablet at the first sign of migraine.  May repeat in 2 hours. 07/31/17   Denita Lung, MD  traZODone (DESYREL) 50 MG tablet Take 50 mg by mouth at bedtime as needed for sleep.     [provider]    Physical Exam: Vitals:   11/06/17 2121 11/06/17 2130 11/06/17 2145 11/06/17 2200  BP: 140/85 121/76 (!) 149/89 (!) 131/97  Pulse: (!) 118 (!) 117 (!) 122 (!) 127  Resp:  20 19 14   Temp:      TempSrc:      SpO2:  98% 100% 98%    Constitutional: Tremulous Vitals:   11/06/17 2121 11/06/17 2130 11/06/17 2145 11/06/17 2200  BP: 140/85 121/76 (!) 149/89 (!) 131/97  Pulse: (!) 118 (!) 117 (!) 122 (!) 127  Resp:  20 19 14   Temp:      TempSrc:      SpO2:  98% 100% 98%   Eyes: PERRL, lids and conjunctivae normal ENMT: Mucous membranes are moist. Posterior pharynx clear of any exudate or lesions Neck: normal, supple, no masses, no thyromegaly Respiratory: clear to auscultation bilaterally, no wheezing, no crackles. Normal respiratory effort. No accessory muscle use.  Cardiovascular: Tachycardic but regular rate and rhythm, no murmurs / rubs / gallops. No extremity edema. 2+ pedal pulses. No carotid bruits.  Abdomen: no tenderness, no masses palpated. No hepatosplenomegaly. Bowel sounds positive.  Musculoskeletal: no clubbing / cyanosis. No joint deformity upper and lower extremities. Good ROM, no contractures. Normal muscle tone.  Skin: no rashes, lesions, ulcers. No induration Neurologic: CN  2-12 grossly intact.  Strength 5/5 in all 4.  Psychiatric: Normal judgment and insight. Alert and oriented x 3. Normal mood.   Labs on Admission: I have personally reviewed following labs and imaging studies  CBC: Recent Labs  Lab 11/06/17 1704  WBC 8.9  HGB 14.5  HCT 43.5  MCV 102.4*  PLT 83*   Basic Metabolic Panel: Recent Labs  Lab 11/06/17 1951  NA 142  K 4.5  CL 109  CO2 21*  GLUCOSE 88  BUN 11  CREATININE 0.61  CALCIUM 7.9*   GFR: CrCl cannot be calculated (Unknown ideal weight.). Liver Function Tests: Recent Labs  Lab 11/06/17 1951  AST 31  ALT 9  ALKPHOS 78  BILITOT 0.9  PROT 6.5  ALBUMIN 3.6   Urine analysis:    Component Value Date/Time   COLORURINE AMBER (A) 07/20/2017 2240   APPEARANCEUR HAZY (A) 07/20/2017 2240   LABSPEC 1.030 09/01/2017 1443   PHURINE 5.0 07/20/2017 2240   GLUCOSEU NEGATIVE 07/20/2017 2240   HGBUR NEGATIVE 07/20/2017 2240   BILIRUBINUR moderate (A) 09/01/2017 1443   KETONESUR negative 09/01/2017 1443   KETONESUR 5 (A) 07/20/2017 2240   PROTEINUR =30 (A) 09/01/2017 1443   PROTEINUR 100 (A) 07/20/2017 2240   UROBILINOGEN 0.2 07/25/2007 0900   NITRITE Negative 09/01/2017 1443   NITRITE NEGATIVE 07/20/2017 2240   LEUKOCYTESUR Moderate (2+) (A) 09/01/2017 1443    Radiological Exams on Admission: Dg Chest Port 1 View  Result Date: 11/06/2017 CLINICAL DATA:  Shortness of breath and tachycardia EXAM: PORTABLE CHEST 1 VIEW COMPARISON:  CT chest 03/07/2014, radiograph 02/27/2014 FINDINGS: The heart size and mediastinal contours are within normal limits. Both lungs are clear. The visualized skeletal structures are unremarkable. IMPRESSION: No active disease. Electronically Signed   By: Donavan Foil M.D.   On: 11/06/2017 17:42    EKG: None  Assessment/Plan Principal Problem:   Alcohol use disorder, severe, dependence (HCC) Active Problems:   SVT (supraventricular tachycardia) (HCC)   Major depressive disorder, recurrent  episode, moderate (HCC)   Alcohol withdrawal (HCC)   Alcohol withdrawal with early delirium tremens-severe dependence history.  Blood alcohol level 68.  Alcoholic drink 3a.m this morning, ~20hrs ago.  Tachycardic in SVT, diaphoresis, tremulousness. -CIWA with IV ativan PRN and scheduled -IV fluid D5 half-normal saline 100cc /hr x 15hrs -Thiamine folate - UDS -N.p.o. for now -Seizure precautions  SVT- heart rates in the 180s >>115.  Likely provoked by alcohol intoxication and withdrawal.  Last propranolol dose yesterday morning. -Get EKG -Resume propranolol -TSH - Trops x 3  Chest pain-possibly alcoholic gastritis, in the setting of SVT will rule out ACS. -Tropes x3 -EKG a.m. -UDS  Thrombocytopenia-83, recent baseline 190s to 300.  Likely related to alcohol abuse. -CBC a.m. -SCDs, avoiding pharmacologic DVT prophylaxis   Depression -Continue home hydroxyzine, buspirone and Cymbalta  HIV as part of routine health screening  DVT prophylaxis: SCDS Code Status: Full Family Communication: None at bedside Disposition Plan: Per rounding team Consults called: None  Admission status: inpt as she is requiring step down level care.   Bethena Roys MD [facility] Pager [phone] From 6PM-2AM.  Otherwise please contact night-coverage [email or web address] Password [email or web address]  11/06/2017, 11:01 PM

## 2017-11-07 LAB — TROPONIN I

## 2017-11-07 LAB — CBC
HCT: 35.7 % — ABNORMAL LOW (ref 36.0–46.0)
Hemoglobin: 12.1 g/dL (ref 12.0–15.0)
MCH: 34.7 pg — AB (ref 26.0–34.0)
MCHC: 33.9 g/dL (ref 30.0–36.0)
MCV: 102.3 fL — ABNORMAL HIGH (ref 78.0–100.0)
PLATELETS: 81 10*3/uL — AB (ref 150–400)
RBC: 3.49 MIL/uL — AB (ref 3.87–5.11)
RDW: 15.1 % (ref 11.5–15.5)
WBC: 7.4 10*3/uL (ref 4.0–10.5)

## 2017-11-07 LAB — TSH: TSH: 3.567 u[IU]/mL (ref 0.350–4.500)

## 2017-11-07 LAB — RAPID URINE DRUG SCREEN, HOSP PERFORMED
Amphetamines: NOT DETECTED
Barbiturates: NOT DETECTED
Benzodiazepines: POSITIVE — AB
Cocaine: NOT DETECTED
Opiates: NOT DETECTED
Tetrahydrocannabinol: NOT DETECTED

## 2017-11-07 LAB — MRSA PCR SCREENING: MRSA BY PCR: NEGATIVE

## 2017-11-07 LAB — HIV ANTIBODY (ROUTINE TESTING W REFLEX): HIV SCREEN 4TH GENERATION: NONREACTIVE

## 2017-11-07 MED ORDER — MAGNESIUM SULFATE 2 GM/50ML IV SOLN
2.0000 g | Freq: Once | INTRAVENOUS | Status: AC
  Administered 2017-11-07: 2 g via INTRAVENOUS
  Filled 2017-11-07: qty 50

## 2017-11-07 MED ORDER — BUSPIRONE HCL 5 MG PO TABS
15.0000 mg | ORAL_TABLET | Freq: Three times a day (TID) | ORAL | Status: DC
  Administered 2017-11-07 – 2017-11-09 (×7): 15 mg via ORAL
  Filled 2017-11-07 (×6): qty 3
  Filled 2017-11-07: qty 2

## 2017-11-07 MED ORDER — DEXTROSE-NACL 5-0.9 % IV SOLN
INTRAVENOUS | Status: DC
  Administered 2017-11-07 – 2017-11-08 (×2): via INTRAVENOUS

## 2017-11-07 NOTE — Plan of Care (Signed)

## 2017-11-07 NOTE — ED Notes (Signed)
RN had to do a total bed change, patient move to end on bed and urinated.

## 2017-11-07 NOTE — ED Notes (Signed)
Report called for 5W RN

## 2017-11-07 NOTE — Progress Notes (Signed)
[place] TEAM 1 - Stepdown/ICU TEAM  YARELIZ THORSTENSON  ZES:[id number] DOB: [birth year] DOA: 11/06/2017 PCP: Denita Lung, MD    Brief Narrative:  62 y.o. female with a hx of alcohol abuse, SVT, and depression who presented to the ED with drenching sweats and tremors w/ suspicion of EtOH withdrawal.  Her last drink was ~20 hours earlier. She drinks 2-3 bottles of wine daily. EMS was summoned and found her in SVT w/ HR 170-180s.  6mg  adenosine was given without effect, after which 12 mg adenosine was given at which time the patient converted to sinus tachycardia.   Subjective: Sedate.  Does not awaken for exam.  No evidence of resp distress or uncontrolled pain.  No family present in room.    Assessment & Plan:  Alcohol withdrawal with delirium tremens cont CIWA with IV ativan PRN and scheduled - well controlled at this time   SVT provoked by alcohol withdrawal as well as BB "withdrawal" - resumed propranolol - TSH normal - Mg now severely low - now in NSR - follow on tele   Chest pain Most c/w alcoholic gastritis per admitting MD - troponin unrevealing - question further when awakens   Thrombocytopenia Due to alcohol abuse  Depression Cont home medical regimen   DVT prophylaxis: SCDs Code Status: FULL CODE Family Communication: no family present at time of exam  Disposition Plan:   Consultants:  none  Antimicrobials:  none   Objective: Blood pressure 132/74, pulse 77, temperature 98.6 F (37 C), temperature source Oral, resp. rate 19, SpO2 100 %.  Intake/Output Summary (Last 24 hours) at 11/07/2017 1413 Last data filed at 11/07/2017 0023 Gross per 24 hour  Intake 2000 ml  Output -  Net 2000 ml   There were no vitals filed for this visit.  Examination: General: No acute respiratory distress Lungs: Clear to auscultation bilaterally without wheezes or crackles Cardiovascular: Regular rate and rhythm without murmur gallop or rub normal S1 and S2 Abdomen: Nontender,  nondistended, soft, bowel sounds positive, no rebound, no ascites, no appreciable mass Extremities: No significant cyanosis, clubbing, or edema bilateral lower extremities  CBC: Recent Labs  Lab 11/06/17 1704  WBC 8.9  HGB 14.5  HCT 43.5  MCV 102.4*  PLT 83*   Basic Metabolic Panel: Recent Labs  Lab 11/06/17 1951  NA 142  K 4.5  CL 109  CO2 21*  GLUCOSE 88  BUN 11  CREATININE 0.61  CALCIUM 7.9*  MG 1.7   GFR: CrCl cannot be calculated (Unknown ideal weight.).  Liver Function Tests: Recent Labs  Lab 11/06/17 1951  AST 31  ALT 9  ALKPHOS 78  BILITOT 0.9  PROT 6.5  ALBUMIN 3.6    Cardiac Enzymes: Recent Labs  Lab 11/06/17 2354 11/07/17 0825  TROPONINI <0.03 <0.03    HbA1C: Hgb A1c MFr Bld  Date/Time Value Ref Range Status  03/19/2013 05:15 AM 5.0 <5.7 % Final    Comment:    (NOTE)                                                                       According to the ADA Clinical Practice Recommendations for 2011, when HbA1c is used as a screening test:  >=6.5%  Diagnostic of Diabetes Mellitus           (if abnormal result is confirmed) 5.7-6.4%   Increased risk of developing Diabetes Mellitus References:Diagnosis and Classification of Diabetes Mellitus,Diabetes IHWT,8882,80(KLKJZ 1):S62-S69 and Standards of Medical Care in         Diabetes - 2011,Diabetes Care,2011,34 (Suppl 1):S11-S61.    Scheduled Meds: . busPIRone  15 mg Oral TID  . DULoxetine  60 mg Oral Daily  . folic acid  1 mg Oral Daily  . hydrOXYzine  10 mg Oral BID  . LORazepam  0-4 mg Intravenous Q6H   Followed by  . [START ON 11/09/2017] LORazepam  0-4 mg Intravenous Q12H  . multivitamin with minerals  1 tablet Oral Daily  . propranolol  10 mg Oral BID  . thiamine  100 mg Oral Daily   Or  . thiamine  100 mg Intravenous Daily     LOS: 1 day   Cherene Altes, MD [facility] Office  [phone] Pager - Text Page per Shea Evans  If 7PM-7AM, please contact  night-coverage per Amion 11/07/2017, 2:13 PM

## 2017-11-08 DIAGNOSIS — I471 Supraventricular tachycardia: Secondary | ICD-10-CM

## 2017-11-08 DIAGNOSIS — F10230 Alcohol dependence with withdrawal, uncomplicated: Secondary | ICD-10-CM

## 2017-11-08 DIAGNOSIS — F322 Major depressive disorder, single episode, severe without psychotic features: Secondary | ICD-10-CM

## 2017-11-08 DIAGNOSIS — E876 Hypokalemia: Secondary | ICD-10-CM

## 2017-11-08 LAB — COMPREHENSIVE METABOLIC PANEL
ALBUMIN: 3.2 g/dL — AB (ref 3.5–5.0)
ALK PHOS: 70 U/L (ref 38–126)
ALT: 11 U/L (ref 0–44)
ANION GAP: 8 (ref 5–15)
AST: 51 U/L — ABNORMAL HIGH (ref 15–41)
BUN: 5 mg/dL — ABNORMAL LOW (ref 8–23)
CALCIUM: 7.6 mg/dL — AB (ref 8.9–10.3)
CO2: 22 mmol/L (ref 22–32)
CREATININE: 0.58 mg/dL (ref 0.44–1.00)
Chloride: 107 mmol/L (ref 98–111)
GFR calc non Af Amer: >60 mL/min (ref >60)
Glucose, Bld: 98 mg/dL (ref 70–99)
Potassium: 3.4 mmol/L — ABNORMAL LOW (ref 3.5–5.1)
SODIUM: 137 mmol/L (ref 135–145)
TOTAL PROTEIN: 5.9 g/dL — AB (ref 6.5–8.1)
Total Bilirubin: 0.9 mg/dL (ref 0.3–1.2)

## 2017-11-08 LAB — CBC
HCT: 35.6 % — ABNORMAL LOW (ref 36.0–46.0)
HEMOGLOBIN: 12.2 g/dL (ref 12.0–15.0)
MCH: 34.7 pg — AB (ref 26.0–34.0)
MCHC: 34.3 g/dL (ref 30.0–36.0)
MCV: 101.1 fL — ABNORMAL HIGH (ref 78.0–100.0)
Platelets: 83 10*3/uL — ABNORMAL LOW (ref 150–400)
RBC: 3.52 MIL/uL — AB (ref 3.87–5.11)
RDW: 14.6 % (ref 11.5–15.5)
WBC: 6.2 10*3/uL (ref 4.0–10.5)

## 2017-11-08 LAB — MAGNESIUM: MAGNESIUM: 2.3 mg/dL (ref 1.7–2.4)

## 2017-11-08 LAB — PHOSPHORUS: PHOSPHORUS: 1.9 mg/dL — AB (ref 2.5–4.6)

## 2017-11-08 MED ORDER — PANTOPRAZOLE SODIUM 40 MG PO TBEC
40.0000 mg | DELAYED_RELEASE_TABLET | Freq: Every day | ORAL | Status: DC
  Administered 2017-11-08: 40 mg via ORAL
  Filled 2017-11-08 (×2): qty 1

## 2017-11-08 MED ORDER — POTASSIUM CHLORIDE CRYS ER 20 MEQ PO TBCR
40.0000 meq | EXTENDED_RELEASE_TABLET | Freq: Once | ORAL | Status: AC
  Administered 2017-11-08: 40 meq via ORAL
  Filled 2017-11-08: qty 2

## 2017-11-08 MED ORDER — K PHOS MONO-SOD PHOS DI & MONO 155-852-130 MG PO TABS
500.0000 mg | ORAL_TABLET | Freq: Every day | ORAL | Status: DC
Start: 2017-11-08 — End: 2017-11-09
  Administered 2017-11-08 – 2017-11-09 (×2): 500 mg via ORAL
  Filled 2017-11-08 (×2): qty 2

## 2017-11-08 MED ORDER — PNEUMOCOCCAL VAC POLYVALENT 25 MCG/0.5ML IJ INJ
0.5000 mL | INJECTION | INTRAMUSCULAR | Status: AC
  Administered 2017-11-09: 0.5 mL via INTRAMUSCULAR
  Filled 2017-11-08: qty 0.5

## 2017-11-08 NOTE — Progress Notes (Signed)
PROGRESS NOTE        PATIENT DETAILS Name: Terri Ayala Age: 62 y.o. Sex: female Date of Birth: 1955-11-30 Admit Date: 11/06/2017 Admitting Physician Ejiroghene Arlyce Dice, MD QMG:[id number], Elyse Jarvis, MD  Brief Narrative: Patient is a 62 y.o. female with long-standing history of alcohol abuse, SVT admitted to the hospital for withdrawal symptoms and one episode of SVT.  See below for further details  Subjective: Awake and alert.  Hardly any tremors.  She was sleeping comfortably and woke up with a loud verbal stimuli.   Assessment/Plan: Alcohol withdrawal: Improving-continue Ativan per protocol.  Alert awake-minimal tremors-ambulate with physical therapy, continue supportive care and follow.  SVT: Occurred in the ED-resolved following 12 mg of adenosine x1.  Continue propranolol-telemetry unremarkable-since has been stable on telemetry since admission-we will discontinue telemetry.  Chest pain: Likely secondary to alcoholic gastritis-troponins negative-doubt further work-up required.  Start PPI  Thrombocytopenia: Suspect related to alcohol use,Follow-no evidence of bleeding.  Depression: Continue BuSpar and Cymbalta  Hypokalemia: In the setting of alcohol use-replete and recheck  Hypophosphatemia: In the setting of alcohol use, replete and recheck.  DVT Prophylaxis: SCD's  Code Status: Full code  Family Communication: None at bedside  Disposition Plan: Remain inpatient-hopefully home in the next day or 2 depending on clinical course.  Antimicrobial agents: Anti-infectives (From admission, onward)   None     Procedures: None  CONSULTS:  None  Time spent: 25- minutes-Greater than 50% of this time was spent in counseling, explanation of diagnosis, planning of further management, and coordination of care.  MEDICATIONS: Scheduled Meds: . busPIRone  15 mg Oral TID  . DULoxetine  60 mg Oral Daily  . folic acid  1 mg Oral Daily  . hydrOXYzine   10 mg Oral BID  . LORazepam  0-4 mg Intravenous Q6H   Followed by  . [START ON 11/09/2017] LORazepam  0-4 mg Intravenous Q12H  . multivitamin with minerals  1 tablet Oral Daily  . [START ON 11/09/2017] pneumococcal 23 valent vaccine  0.5 mL Intramuscular Tomorrow-1000  . propranolol  10 mg Oral BID  . thiamine  100 mg Oral Daily   Or  . thiamine  100 mg Intravenous Daily   Continuous Infusions: . dextrose 5 % and 0.9% NaCl 50 mL/hr at 11/08/17 0800   PRN Meds:.acetaminophen **OR** [DISCONTINUED] acetaminophen, LORazepam **OR** LORazepam, ondansetron **OR** ondansetron (ZOFRAN) IV, polyethylene glycol   PHYSICAL EXAM: Vital signs: Vitals:   11/07/17 1400 11/07/17 2109 11/07/17 2357 11/08/17 0520  BP:  118/72 128/72 (!) 146/82  Pulse:  84 81 90  Resp:  16 18 18   Temp: 98.6 F (37 C) 99 F (37.2 C) 98.2 F (36.8 C) 98.1 F (36.7 C)  TempSrc: Oral  Oral Oral  SpO2:  97% 98% 100%   There were no vitals filed for this visit. There is no height or weight on file to calculate BMI.   General appearance :Awake, alert, not in any distress. Speech Clear.  Eyes:Pink conjunctiva HEENT: Atraumatic and Normocephalic Neck: supple, no JVD. No cervical lymphadenopathy. No thyromegaly Resp:Good air entry bilaterally, no added sounds  CVS: S1 S2 regular, no murmurs.  GI: Bowel sounds present, Non tender and not distended with no gaurding, rigidity or rebound.No organomegaly Extremities: B/L Lower Ext shows no edema, both legs are warm to touch Neurology:  speech clear,Non focal, sensation  is grossly intact. Psychiatric: Normal judgment and insight. Alert and oriented x 3.  Musculoskeletal:No digital cyanosis Skin:No Rash, warm and dry Wounds:N/A  I have personally reviewed following labs and imaging studies  LABORATORY DATA: CBC: Recent Labs  Lab 11/06/17 1704 11/07/17 1355 11/08/17 0336  WBC 8.9 7.4 6.2  HGB 14.5 12.1 12.2  HCT 43.5 35.7* 35.6*  MCV 102.4* 102.3* 101.1*  PLT  83* 81* 83*    Basic Metabolic Panel: Recent Labs  Lab 11/06/17 1951 11/08/17 0336  NA 142 137  K 4.5 3.4*  CL 109 107  CO2 21* 22  GLUCOSE 88 98  BUN 11 5*  CREATININE 0.61 0.58  CALCIUM 7.9* 7.6*  MG 1.7 2.3  PHOS  --  1.9*    GFR: CrCl cannot be calculated (Unknown ideal weight.).  Liver Function Tests: Recent Labs  Lab 11/06/17 1951 11/08/17 0336  AST 31 51*  ALT 9 11  ALKPHOS 78 70  BILITOT 0.9 0.9  PROT 6.5 5.9*  ALBUMIN 3.6 3.2*   No results for input(s): LIPASE, AMYLASE in the last 168 hours. No results for input(s): AMMONIA in the last 168 hours.  Coagulation Profile: No results for input(s): INR, PROTIME in the last 168 hours.  Cardiac Enzymes: Recent Labs  Lab 11/06/17 2354 11/07/17 0825  TROPONINI <0.03 <0.03    BNP (last 3 results) No results for input(s): PROBNP in the last 8760 hours.  HbA1C: No results for input(s): HGBA1C in the last 72 hours.  CBG: No results for input(s): GLUCAP in the last 168 hours.  Lipid Profile: No results for input(s): CHOL, HDL, LDLCALC, TRIG, CHOLHDL, LDLDIRECT in the last 72 hours.  Thyroid Function Tests: Recent Labs    11/06/17 2359  TSH 3.567    Anemia Panel: No results for input(s): VITAMINB12, FOLATE, FERRITIN, TIBC, IRON, RETICCTPCT in the last 72 hours.  Urine analysis:    Component Value Date/Time   COLORURINE AMBER (A) 07/20/2017 2240   APPEARANCEUR HAZY (A) 07/20/2017 2240   LABSPEC 1.030 09/01/2017 1443   PHURINE 5.0 07/20/2017 2240   GLUCOSEU NEGATIVE 07/20/2017 2240   HGBUR NEGATIVE 07/20/2017 2240   BILIRUBINUR moderate (A) 09/01/2017 1443   KETONESUR negative 09/01/2017 1443   KETONESUR 5 (A) 07/20/2017 2240   PROTEINUR =30 (A) 09/01/2017 1443   PROTEINUR 100 (A) 07/20/2017 2240   UROBILINOGEN 0.2 07/25/2007 0900   NITRITE Negative 09/01/2017 1443   NITRITE NEGATIVE 07/20/2017 2240   LEUKOCYTESUR Moderate (2+) (A) 09/01/2017 1443    Sepsis Labs: Lactic Acid,  Venous No results found for: LATICACIDVEN  MICROBIOLOGY: Recent Results (from the past 240 hour(s))  MRSA PCR Screening     Status: None   Collection Time: 11/07/17  5:51 PM  Result Value Ref Range Status   MRSA by PCR NEGATIVE NEGATIVE Final    Comment:        The GeneXpert MRSA Assay (FDA approved for NASAL specimens only), is one component of a comprehensive MRSA colonization surveillance program. It is not intended to diagnose MRSA infection nor to guide or monitor treatment for MRSA infections. Performed at [facility], [place] [address] [place], [place] [address]     RADIOLOGY STUDIES/RESULTS: Dg Chest Port 1 View  Result Date: 11/06/2017 CLINICAL DATA:  Shortness of breath and tachycardia EXAM: PORTABLE CHEST 1 VIEW COMPARISON:  CT chest 03/07/2014, radiograph 02/27/2014 FINDINGS: The heart size and mediastinal contours are within normal limits. Both lungs are clear. The visualized skeletal structures are unremarkable.  IMPRESSION: No active disease. Electronically Signed   By: Donavan Foil M.D.   On: 11/06/2017 17:42     LOS: 2 days   Oren Binet, MD  [facility]  If 7PM-7AM, please contact night-coverage  Please page via [email or web address] [email or web address]-click on MD name and type text message  11/08/2017, 11:10 AM

## 2017-11-08 NOTE — Plan of Care (Signed)
Patient is making progress, patient is alert and oriented, anxiety controlled by medications. No agitation noted and no visual or tactile disturbances. Will continue to monitor

## 2017-11-08 NOTE — Evaluation (Signed)
Physical Therapy Evaluation Patient Details Name: Terri Ayala MRN: [id number] DOB: 01/11/1956 Today's Date: 11/08/2017   History of Present Illness  Pt is a 62 y.o. F with significant PMH for alcohol abuse, SVT, depression, presenting to ED with reports of alcohol withdrawal symptoms. drinks 2-3 bottles of wine daily. Also reports mid central chest pain, nonradiating. Admitted for alcohol withdrawal, SVT and chest pain.  Clinical Impression  Pt admitted with above diagnosis. Pt currently with functional limitations due to the deficits listed below (see PT Problem List). Patient presenting with decreased functional mobility secondary to balance deficits and diminished activity tolerance. Ambulating 120 feet with and without walker and up to moderate assistance for balance. Demonstrated two instances of posterior loss of balance with static standing. Currently presenting as a high fall risk based on the above in addition to decreased gait speed. Recommending SNF, but will continue to assess for improvement. Pt will benefit from skilled PT to increase their independence and safety with mobility to allow discharge to the venue listed below.       Follow Up Recommendations SNF;Supervision/Assistance - 24 hour    Equipment Recommendations  None recommended by PT    Recommendations for Other Services OT consult     Precautions / Restrictions Precautions Precautions: Fall Restrictions Weight Bearing Restrictions: No      Mobility  Bed Mobility Overal bed mobility: Needs Assistance Bed Mobility: Supine to Sit     Supine to sit: Supervision     General bed mobility comments: increased time with cueing provided for progression  Transfers Overall transfer level: Needs assistance Equipment used: None;Rolling walker (2 wheeled) Transfers: Sit to/from Stand Sit to Stand: Min assist         General transfer comment: up to min assist provided for transition from stand to sit in order  to guide patient's hips for a full turn  Ambulation/Gait Ambulation/Gait assistance: Mod assist Gait Distance (Feet): 120 Feet Assistive device: Rolling walker (2 wheeled);IV Pole Gait Pattern/deviations: Step-through pattern;Decreased stride length;Narrow base of support Gait velocity: decreased Gait velocity interpretation: <1.31 ft/sec, indicative of household ambulator General Gait Details: patient requiring up to moderate assistance for balance due to two instances of posterior loss of balance when statically standing (both with and without holding onto walker).   Stairs            Wheelchair Mobility    Modified Rankin (Stroke Patients Only)       Balance Overall balance assessment: Needs assistance Sitting-balance support: No upper extremity supported;Feet supported Sitting balance-Leahy Scale: Good     Standing balance support: No upper extremity supported;During functional activity Standing balance-Leahy Scale: Poor                               Pertinent Vitals/Pain Pain Assessment: Faces Faces Pain Scale: Hurts a little bit Pain Location: stomach pain with sitting EOB Pain Descriptors / Indicators: Discomfort Pain Intervention(s): Monitored during session    Home Living Family/patient expects to be discharged to:: Private residence Living Arrangements: Non-relatives/Friends Available Help at Discharge: Friend(s);Available 24 hours/day Type of Home: House Home Access: Level entry     Home Layout: Able to live on main level with bedroom/bathroom Home Equipment: Walker - 2 wheels;Cane - single point      Prior Function Level of Independence: Independent         Comments: states she drives, is community ambulatory     Hand Dominance  Extremity/Trunk Assessment   Upper Extremity Assessment Upper Extremity Assessment: Overall WFL for tasks assessed    Lower Extremity Assessment Lower Extremity Assessment: Overall WFL  for tasks assessed    Cervical / Trunk Assessment Cervical / Trunk Assessment: Normal  Communication   Communication: No difficulties  Cognition Arousal/Alertness: Awake/alert Behavior During Therapy: WFL for tasks assessed/performed Overall Cognitive Status: Within Functional Limits for tasks assessed                                 General Comments: A&Ox3, some decreased insight into deficits      General Comments General comments (skin integrity, edema, etc.): HR 100's    Exercises     Assessment/Plan    PT Assessment Patient needs continued PT services  PT Problem List Decreased activity tolerance;Decreased balance;Decreased mobility;Decreased coordination;Decreased safety awareness       PT Treatment Interventions DME instruction;Gait training;Stair training;Functional mobility training;Therapeutic activities;Therapeutic exercise;Balance training;Cognitive remediation    PT Goals (Current goals can be found in the Care Plan section)  Acute Rehab PT Goals Patient Stated Goal: "get off some of these drugs to get my balance back." PT Goal Formulation: With patient Time For Goal Achievement: 11/22/17 Potential to Achieve Goals: Good    Frequency Min 3X/week   Barriers to discharge        Co-evaluation               AM-PAC PT "6 Clicks" Daily Activity  Outcome Measure Difficulty turning over in bed (including adjusting bedclothes, sheets and blankets)?: None Difficulty moving from lying on back to sitting on the side of the bed? : A Little Difficulty sitting down on and standing up from a chair with arms (e.g., wheelchair, bedside commode, etc,.)?: A Little Help needed moving to and from a bed to chair (including a wheelchair)?: A Little Help needed walking in hospital room?: A Lot Help needed climbing 3-5 steps with a railing? : A Lot 6 Click Score: 17    End of Session Equipment Utilized During Treatment: Gait belt Activity Tolerance:  Patient tolerated treatment well Patient left: in chair;with call bell/phone within reach;with chair alarm set Nurse Communication: Mobility status PT Visit Diagnosis: Unsteadiness on feet (R26.81);Difficulty in walking, not elsewhere classified (R26.2)    Time: 3335-4562 PT Time Calculation (min) (ACUTE ONLY): 22 min   Charges:   PT Evaluation $PT Eval Moderate Complexity: 1 Mod          Ellamae Sia, [place], DPT Acute Rehabilitation Services Pager [phone] Office [phone]   Willy Eddy 11/08/2017, 10:40 AM

## 2017-11-08 NOTE — Progress Notes (Signed)
CSW received consult for SNF placement. Patient not appropriate for SNF at this time (walking over 169ft) and here for withdrawal. Will alert RNCM of possible need for [facility] PT. Please re-consult if needed.   CSW signing off.  Percell Locus Gaile Allmon LCSW [phone]

## 2017-11-09 DIAGNOSIS — F331 Major depressive disorder, recurrent, moderate: Secondary | ICD-10-CM

## 2017-11-09 DIAGNOSIS — F1020 Alcohol dependence, uncomplicated: Secondary | ICD-10-CM

## 2017-11-09 LAB — BASIC METABOLIC PANEL
Anion gap: 6 (ref 5–15)
BUN: 5 mg/dL — AB (ref 8–23)
CALCIUM: 7.7 mg/dL — AB (ref 8.9–10.3)
CO2: 23 mmol/L (ref 22–32)
Chloride: 110 mmol/L (ref 98–111)
Creatinine, Ser: 0.59 mg/dL (ref 0.44–1.00)
GFR calc Af Amer: >60 mL/min (ref >60)
GLUCOSE: 105 mg/dL — AB (ref 70–99)
Potassium: 3.4 mmol/L — ABNORMAL LOW (ref 3.5–5.1)
Sodium: 139 mmol/L (ref 135–145)

## 2017-11-09 LAB — MAGNESIUM: Magnesium: 2.2 mg/dL (ref 1.7–2.4)

## 2017-11-09 MED ORDER — THIAMINE HCL 100 MG PO TABS: 100.0000 mg | ORAL_TABLET | Freq: Every day | ORAL | 0 refills | Status: DC

## 2017-11-09 MED ORDER — PANTOPRAZOLE SODIUM 40 MG PO TBEC: 40.0000 mg | DELAYED_RELEASE_TABLET | Freq: Every day | ORAL | 0 refills | Status: DC

## 2017-11-09 MED ORDER — POTASSIUM CHLORIDE CRYS ER 20 MEQ PO TBCR
40.0000 meq | EXTENDED_RELEASE_TABLET | Freq: Once | ORAL | Status: AC
  Administered 2017-11-09: 40 meq via ORAL
  Filled 2017-11-09: qty 2

## 2017-11-09 MED ORDER — CHLORDIAZEPOXIDE HCL 25 MG PO CAPS: ORAL_CAPSULE | ORAL | 0 refills | Status: DC

## 2017-11-09 NOTE — Discharge Summary (Signed)
PATIENT DETAILS Name: Terri Ayala Age: 62 y.o. Sex: female Date of Birth: February 21, 1956 MRN: [id number]. Admitting Physician: Bethena Roys, MD [id number], Elyse Jarvis, MD  Admit Date: 11/06/2017 Discharge date: 11/09/2017  Recommendations for Outpatient Follow-up:  1. Follow up with PCP in 1-2 weeks 2. Please obtain BMP/CBC in one week 3. Please continue to counsel regarding abstinence from alcohol use.  Admitted From:  Home  Disposition: Home with home health services   Home Health:  Yes  Equipment/Devices: None  Discharge Condition: Stable  CODE STATUS: FULL CODE  Diet recommendation:  Heart Healthy   Brief Summary: See H&P, Labs, Consult and Test reports for all details in brief, Patient is a 62 y.o. female with long-standing history of alcohol abuse, SVT admitted to the hospital for withdrawal symptoms and one episode of SVT.  See below for further details  Brief Hospital Course: Alcohol withdrawal: Significantly improved-hardly any tremors this morning, she does not want to remain hospitalized any further-is asking for discharge.  Will switch over to tapering Librium for another day or 2, continue thiamine-and discharged home at home request.  Note she is completely awake and alert.  He has been counseled extensively regarding importance of abstaining from alcohol use-she claims she follows with alcohol Anonymous.  She claims she has no intention of drinking any further.  SVT: Occurred in the ED-resolved following 12 mg of adenosine x1.  Continue propranolol on discharge  Chest pain: Likely secondary to alcoholic gastritis-troponins negative-doubt further work-up required.    Continue PPI  Thrombocytopenia: Suspect related to alcohol use,Follow closely in the outpatient setting-no evidence of bleeding.  Depression: Continue BuSpar and Cymbalta.  This appears to be stable-she denies any suicidal or homicidal intent.  Hypokalemia: In the setting of alcohol  use-repleted  Hypophosphatemia: In the setting of alcohol use, repleted  Procedures/Studies: None  Discharge Diagnoses:  Principal Problem:   Alcohol use disorder, severe, dependence ([place]) Active Problems:   SVT (supraventricular tachycardia) ([facility])   Major depressive disorder, recurrent episode, moderate ([facility])   Alcohol withdrawal ([place])   Discharge Instructions:  Activity:  As tolerated with Full fall precautions use walker/cane & assistance as needed   Discharge Instructions    Call MD for:  persistant nausea and vomiting   Complete by:  As directed    Call MD for:  severe uncontrolled pain   Complete by:  As directed    Diet - low sodium heart healthy   Complete by:  As directed    Discharge instructions   Complete by:  As directed    Follow with Primary MD  Denita Lung, MD in 1 week  Please get a complete blood count and chemistry panel checked by your Primary MD at your next visit, and again as instructed by your Primary MD.  Get Medicines reviewed and adjusted: Please take all your medications with you for your next visit with your Primary MD  Laboratory/radiological data: Please request your Primary MD to go over all hospital tests and procedure/radiological results at the follow up, please ask your Primary MD to get all Hospital records sent to his/her office.  In some cases, they will be blood work, cultures and biopsy results pending at the time of your discharge. Please request that your primary care M.D. follows up on these results.  Also Note the following: If you experience worsening of your admission symptoms, develop shortness of breath, life threatening emergency, suicidal or homicidal thoughts you must seek medical attention immediately  by calling 911 or calling your MD immediately  if symptoms less severe.  You must read complete instructions/literature along with all the possible adverse reactions/side effects for all the Medicines you take and  that have been prescribed to you. Take any new Medicines after you have completely understood and accpet all the possible adverse reactions/side effects.   Do not drive when taking Pain medications or sleeping medications (Benzodaizepines)  Do not take more than prescribed Pain, Sleep and Anxiety Medications. It is not advisable to combine anxiety,sleep and pain medications without talking with your primary care practitioner  Special Instructions: If you have smoked or chewed Tobacco  in the last 2 yrs please stop smoking, stop any regular Alcohol  and or any Recreational drug use.  Wear Seat belts while driving.  Please note: You were cared for by a hospitalist during your hospital stay. Once you are discharged, your primary care physician will handle any further medical issues. Please note that NO REFILLS for any discharge medications will be authorized once you are discharged, as it is imperative that you return to your primary care physician (or establish a relationship with a primary care physician if you do not have one) for your post hospital discharge needs so that they can reassess your need for medications and monitor your lab values.   Increase activity slowly   Complete by:  As directed      Allergies as of 11/09/2017      Reactions   Doxycycline Swelling   Mouth swelling and sores   Codeine Itching   Tetracycline Hcl Swelling   Mouth swelling and sores      Medication List    STOP taking these medications   sulfamethoxazole-trimethoprim 800-160 MG tablet Commonly known as:  BACTRIM DS,SEPTRA DS     TAKE these medications   alendronate 70 MG tablet Commonly known as:  FOSAMAX Take 70 mg by mouth once a week. Take with a full glass of water on an empty stomach.   busPIRone 15 MG tablet Commonly known as:  BUSPAR Take 15 mg by mouth 3 (three) times daily.   chlordiazePOXIDE 25 MG capsule Commonly known as:  LIBRIUM 25 mg PO BID x 1Day, then 12.5mg  PO BID X 1Day,  then 12.5 mg PO QD X 1Day What changed:  additional instructions   DULoxetine 60 MG capsule Commonly known as:  CYMBALTA TAKE 1 CAPSULE (60 MG TOTAL) BY MOUTH DAILY. What changed:  See the new instructions.   hydrOXYzine 10 MG tablet Commonly known as:  ATARAX/VISTARIL Take 10 mg by mouth 2 (two) times daily.   meloxicam 15 MG tablet Commonly known as:  MOBIC Take 15 mg by mouth daily.   naltrexone 50 MG tablet Commonly known as:  DEPADE Take 50 mg by mouth daily.   norethindrone-ethinyl estradiol 1-5 MG-MCG Tabs tablet Commonly known as:  FEMHRT 1/5 Take 1 tablet by mouth daily. For Osteoporosis   ondansetron 4 MG disintegrating tablet Commonly known as:  ZOFRAN-ODT Take 1 tablet (4 mg total) by mouth every 8 (eight) hours as needed for nausea or vomiting.   pantoprazole 40 MG tablet Commonly known as:  PROTONIX Take 1 tablet (40 mg total) by mouth daily at 12 noon.   propranolol 10 MG tablet Commonly known as:  INDERAL Take 20 mg by mouth 2 (two) times daily.   SUMAtriptan 100 MG tablet Commonly known as:  IMITREX Take 1 tablet at the first sign of migraine.  May repeat in 2  hours.   thiamine 100 MG tablet Take 1 tablet (100 mg total) by mouth daily.   traZODone 50 MG tablet Commonly known as:  DESYREL Take 50 mg by mouth at bedtime as needed for sleep.      Follow-up Information    Denita Lung, MD. Schedule an appointment as soon as possible for a visit in 1 week(s).   Specialty:  Family Medicine Contact information: [address] [place] [address] [phone]          Allergies  Allergen Reactions  . Doxycycline Swelling    Mouth swelling and sores  . Codeine Itching  . Tetracycline Hcl Swelling    Mouth swelling and sores    Consultations:   None   Other Procedures/Studies: Dg Chest Port 1 View  Result Date: 11/06/2017 CLINICAL DATA:  Shortness of breath and tachycardia EXAM: PORTABLE CHEST 1 VIEW COMPARISON:  CT  chest 03/07/2014, radiograph 02/27/2014 FINDINGS: The heart size and mediastinal contours are within normal limits. Both lungs are clear. The visualized skeletal structures are unremarkable. IMPRESSION: No active disease. Electronically Signed   By: Donavan Foil M.D.   On: 11/06/2017 17:42      TODAY-DAY OF DISCHARGE:  Subjective:   Eston Esters today has no headache,no chest abdominal pain,no new weakness tingling or numbness, feels much better wants to go home today.  She does not wish to remain hospitalized anymore-she claims she has something urgent to deal with at home.  Objective:   Blood pressure 134/77, pulse 74, temperature 98.1 F (36.7 C), temperature source Oral, resp. rate 18, SpO2 100 %.  Intake/Output Summary (Last 24 hours) at 11/09/2017 0852 Last data filed at 11/08/2017 1600 Gross per 24 hour  Intake 798.19 ml  Output 600 ml  Net 198.19 ml   There were no vitals filed for this visit.  Exam: Awake Alert, Oriented *3, No new F.N deficits, Normal affect [place].AT,PERRAL Supple Neck,No JVD, No cervical lymphadenopathy appriciated.  Symmetrical Chest wall movement, Good air movement bilaterally, CTAB RRR,No Gallops,Rubs or new Murmurs, No Parasternal Heave +ve B.Sounds, Abd Soft, Non tender, No organomegaly appriciated, No rebound -guarding or rigidity. No Cyanosis, Clubbing or edema, No new Rash or bruise   PERTINENT RADIOLOGIC STUDIES: Dg Chest Port 1 View  Result Date: 11/06/2017 CLINICAL DATA:  Shortness of breath and tachycardia EXAM: PORTABLE CHEST 1 VIEW COMPARISON:  CT chest 03/07/2014, radiograph 02/27/2014 FINDINGS: The heart size and mediastinal contours are within normal limits. Both lungs are clear. The visualized skeletal structures are unremarkable. IMPRESSION: No active disease. Electronically Signed   By: Donavan Foil M.D.   On: 11/06/2017 17:42     PERTINENT LAB RESULTS: CBC: Recent Labs    11/07/17 1355 11/08/17 0336  WBC 7.4 6.2  HGB 12.1  12.2  HCT 35.7* 35.6*  PLT 81* 83*   CMET CMP     Component Value Date/Time   NA 139 11/09/2017 0453   NA 139 09/01/2017 1448   K 3.4 (L) 11/09/2017 0453   CL 110 11/09/2017 0453   CO2 23 11/09/2017 0453   GLUCOSE 105 (H) 11/09/2017 0453   BUN 5 (L) 11/09/2017 0453   BUN 16 09/01/2017 1448   CREATININE 0.59 11/09/2017 0453   CREATININE 0.39 (L) 04/22/2013 1416   CALCIUM 7.7 (L) 11/09/2017 0453   PROT 5.9 (L) 11/08/2017 0336   PROT 6.8 09/01/2017 1448   ALBUMIN 3.2 (L) 11/08/2017 0336   ALBUMIN 4.3 09/01/2017 1448   AST 51 (H) 11/08/2017  0336   ALT 11 11/08/2017 0336   ALKPHOS 70 11/08/2017 0336   BILITOT 0.9 11/08/2017 0336   BILITOT 0.3 09/01/2017 1448   GFRNONAA >60 11/09/2017 0453   GFRAA >60 11/09/2017 0453    GFR CrCl cannot be calculated (Unknown ideal weight.). No results for input(s): LIPASE, AMYLASE in the last 72 hours. Recent Labs    11/06/17 2354 11/07/17 0825  TROPONINI <0.03 <0.03   Invalid input(s): POCBNP No results for input(s): DDIMER in the last 72 hours. No results for input(s): HGBA1C in the last 72 hours. No results for input(s): CHOL, HDL, LDLCALC, TRIG, CHOLHDL, LDLDIRECT in the last 72 hours. Recent Labs    11/06/17 2359  TSH 3.567   No results for input(s): VITAMINB12, FOLATE, FERRITIN, TIBC, IRON, RETICCTPCT in the last 72 hours. Coags: No results for input(s): INR in the last 72 hours.  Invalid input(s): PT Microbiology: Recent Results (from the past 240 hour(s))  MRSA PCR Screening     Status: None   Collection Time: 11/07/17  5:51 PM  Result Value Ref Range Status   MRSA by PCR NEGATIVE NEGATIVE Final    Comment:        The GeneXpert MRSA Assay (FDA approved for NASAL specimens only), is one component of a comprehensive MRSA colonization surveillance program. It is not intended to diagnose MRSA infection nor to guide or monitor treatment for MRSA infections. Performed at [facility], [place] [address],  [place], [place] [address]     FURTHER DISCHARGE INSTRUCTIONS:  Get Medicines reviewed and adjusted: Please take all your medications with you for your next visit with your Primary MD  Laboratory/radiological data: Please request your Primary MD to go over all hospital tests and procedure/radiological results at the follow up, please ask your Primary MD to get all Hospital records sent to his/her office.  In some cases, they will be blood work, cultures and biopsy results pending at the time of your discharge. Please request that your primary care M.D. goes through all the records of your hospital data and follows up on these results.  Also Note the following: If you experience worsening of your admission symptoms, develop shortness of breath, life threatening emergency, suicidal or homicidal thoughts you must seek medical attention immediately by calling 911 or calling your MD immediately  if symptoms less severe.  You must read complete instructions/literature along with all the possible adverse reactions/side effects for all the Medicines you take and that have been prescribed to you. Take any new Medicines after you have completely understood and accpet all the possible adverse reactions/side effects.   Do not drive when taking Pain medications or sleeping medications (Benzodaizepines)  Do not take more than prescribed Pain, Sleep and Anxiety Medications. It is not advisable to combine anxiety,sleep and pain medications without talking with your primary care practitioner  Special Instructions: If you have smoked or chewed Tobacco  in the last 2 yrs please stop smoking, stop any regular Alcohol  and or any Recreational drug use.  Wear Seat belts while driving.  Please note: You were cared for by a hospitalist during your hospital stay. Once you are discharged, your primary care physician will handle any further medical issues. Please note that NO REFILLS for any discharge medications will be  authorized once you are discharged, as it is imperative that you return to your primary care physician (or establish a relationship with a primary care physician if you do not have one) for your  post hospital discharge needs so that they can reassess your need for medications and monitor your lab values.  Total Time spent coordinating discharge including counseling, education and face to face time equals 35 minutes  Signed: Oren Binet 11/09/2017 8:52 AM

## 2017-11-09 NOTE — Evaluation (Signed)
Occupational Therapy Evaluation Patient Details Name: Terri Ayala MRN: [id number] DOB: [birth year] Today's Date: 11/09/2017    History of Present Illness Pt is a 62 y.o. F with significant PMH for alcohol abuse, SVT, depression, presenting to ED with reports of alcohol withdrawal symptoms. drinks 2-3 bottles of wine daily. Also reports mid central chest pain, nonradiating. Admitted for alcohol withdrawal, SVT and chest pain.   Clinical Impression   Pt admitted with the above diagnoses and presents with below problem list. PTA pt was independent with ADLs. Pt is currently back to baseline with ADLs. No further OT services needed. Pt is for d/c home today.     Follow Up Recommendations  No OT follow up    Equipment Recommendations  None recommended by OT    Recommendations for Other Services       Precautions / Restrictions Precautions Precautions: Fall Restrictions Weight Bearing Restrictions: No      Mobility Bed Mobility               General bed mobility comments: NT  Transfers Overall transfer level: Independent Equipment used: None                  Balance Overall balance assessment: No apparent balance deficits (not formally assessed)                                         ADL either performed or assessed with clinical judgement   ADL Overall ADL's : Needs assistance/impaired Eating/Feeding: Independent   Grooming: Independent   Upper Body Bathing: Independent   Lower Body Bathing: Independent   Upper Body Dressing : Independent   Lower Body Dressing: Independent   Toilet Transfer: Independent   Toileting- Clothing Manipulation and Hygiene: Independent   Tub/ Shower Transfer: Supervision/safety   Functional mobility during ADLs: Supervision/safety General ADL Comments: Pt donned socks, completed simulated tub transfer, and community distance functional mobility.     Vision         Perception     Praxis       Pertinent Vitals/Pain Pain Assessment: No/denies pain     Hand Dominance     Extremity/Trunk Assessment Upper Extremity Assessment Upper Extremity Assessment: Overall WFL for tasks assessed   Lower Extremity Assessment Lower Extremity Assessment: Overall WFL for tasks assessed   Cervical / Trunk Assessment Cervical / Trunk Assessment: Normal   Communication Communication Communication: No difficulties   Cognition Arousal/Alertness: Awake/alert Behavior During Therapy: WFL for tasks assessed/performed Overall Cognitive Status: Within Functional Limits for tasks assessed                                     General Comments       Exercises     Shoulder Instructions      Home Living Family/patient expects to be discharged to:: Private residence Living Arrangements: Non-relatives/Friends Available Help at Discharge: Friend(s);Available 24 hours/day Type of Home: House Home Access: Level entry     Home Layout: Able to live on main level with bedroom/bathroom     Bathroom Shower/Tub: Teacher, early years/pre: Standard     Home Equipment: Environmental consultant - 2 wheels;Cane - single point          Prior Functioning/Environment Level of Independence: Independent  Comments: states she drives, is community ambulatory        OT Problem List: Impaired balance (sitting and/or standing);Decreased knowledge of precautions;Decreased knowledge of use of DME or AE      OT Treatment/Interventions:      OT Goals(Current goals can be found in the care plan section) Acute Rehab OT Goals Patient Stated Goal: "get off some of these drugs to get my balance back."  OT Frequency:     Barriers to D/C:            Co-evaluation              AM-PAC PT "6 Clicks" Daily Activity     Outcome Measure Help from another person eating meals?: None Help from another person taking care of personal grooming?: None Help from another person toileting,  which includes using toliet, bedpan, or urinal?: None Help from another person bathing (including washing, rinsing, drying)?: None Help from another person to put on and taking off regular upper body clothing?: None Help from another person to put on and taking off regular lower body clothing?: None 6 Click Score: 24   End of Session    Activity Tolerance: Patient tolerated treatment well Patient left: in bed  OT Visit Diagnosis: Unsteadiness on feet (R26.81)                Time: 6295-2841 OT Time Calculation (min): 10 min Charges:  OT General Charges $OT Visit: 1 Visit OT Evaluation $OT Eval Low Complexity: 1 Low    Tyrone Schimke, OT Acute Rehabilitation Services Pager: [phone] Office: [phone]  11/09/2017, 10:49 AM

## 2017-11-09 NOTE — Progress Notes (Signed)
Physical Therapy Treatment Patient Details Name: Terri Ayala MRN: [id number] DOB: 1955/09/24 Today's Date: 11/09/2017    History of Present Illness Pt is a 62 y.o. F with significant PMH for alcohol abuse, SVT, depression, presenting to ED with reports of alcohol withdrawal symptoms. drinks 2-3 bottles of wine daily. Also reports mid central chest pain, nonradiating. Admitted for alcohol withdrawal, SVT and chest pain.    PT Comments     Patient with marked improvement today compared to yesterday's session. Ambulating hallway distances with no assistive device without difficulty. Scoring 21/24 on the Dynamic Gait Index, indicating she is not at high risk for falls. Reviewed fall prevention and safety including proper footwear. Patient verbalized understanding and has no further questions. Updated discharge plan to reflect patient's progress.    Follow Up Recommendations  No PT follow up;Supervision - Intermittent     Equipment Recommendations  None recommended by PT    Recommendations for Other Services OT consult     Precautions / Restrictions Precautions Precautions: Fall Restrictions Weight Bearing Restrictions: No    Mobility  Bed Mobility               General bed mobility comments: Sitting EOB on arrival  Transfers Overall transfer level: Independent Equipment used: None                Ambulation/Gait Ambulation/Gait assistance: Supervision Gait Distance (Feet): 250 Feet Assistive device: IV Pole Gait Pattern/deviations: WFL(Within Functional Limits)     General Gait Details: patient with adequate gait speed and mostly steady gait with one loss of balance laterally with high level balance activity (able to recover without physical assistance)   Stairs             Wheelchair Mobility    Modified Rankin (Stroke Patients Only)       Balance Overall balance assessment: Needs assistance Sitting-balance support: No upper extremity  supported;Feet supported Sitting balance-Leahy Scale: Normal     Standing balance support: No upper extremity supported;During functional activity Standing balance-Leahy Scale: Good                   Standardized Balance Assessment Standardized Balance Assessment : Dynamic Gait Index   Dynamic Gait Index Level Surface: Normal Change in Gait Speed: Normal Gait with Horizontal Head Turns: Normal Gait with Vertical Head Turns: Normal Gait and Pivot Turn: Moderate Impairment Step Over Obstacle: Normal Step Around Obstacles: Normal Steps: Mild Impairment Total Score: 21      Cognition Arousal/Alertness: Awake/alert Behavior During Therapy: WFL for tasks assessed/performed Overall Cognitive Status: Within Functional Limits for tasks assessed                                        Exercises      General Comments        Pertinent Vitals/Pain Pain Assessment: No/denies pain    Home Living Family/patient expects to be discharged to:: Private residence Living Arrangements: Non-relatives/Friends Available Help at Discharge: Friend(s);Available 24 hours/day Type of Home: House Home Access: Level entry   Home Layout: Able to live on main level with bedroom/bathroom Home Equipment: Walker - 2 wheels;Cane - single point      Prior Function Level of Independence: Independent      Comments: states she drives, is community ambulatory   PT Goals (current goals can now be found in the care plan section) Acute Rehab  PT Goals Patient Stated Goal: "get off some of these drugs to get my balance back." PT Goal Formulation: With patient Time For Goal Achievement: 11/22/17 Potential to Achieve Goals: Good Progress towards PT goals: Progressing toward goals    Frequency    Min 3X/week      PT Plan      Co-evaluation              AM-PAC PT "6 Clicks" Daily Activity  Outcome Measure  Difficulty turning over in bed (including adjusting  bedclothes, sheets and blankets)?: None Difficulty moving from lying on back to sitting on the side of the bed? : None Difficulty sitting down on and standing up from a chair with arms (e.g., wheelchair, bedside commode, etc,.)?: None Help needed moving to and from a bed to chair (including a wheelchair)?: None Help needed walking in hospital room?: None Help needed climbing 3-5 steps with a railing? : A Little 6 Click Score: 23    End of Session Equipment Utilized During Treatment: Gait belt Activity Tolerance: Patient tolerated treatment well Patient left: in chair;with call bell/phone within reach;with chair alarm set Nurse Communication: Mobility status PT Visit Diagnosis: Unsteadiness on feet (R26.81);Difficulty in walking, not elsewhere classified (R26.2)     Time: 2876-8115 PT Time Calculation (min) (ACUTE ONLY): 10 min  Charges:  $Therapeutic Activity: 8-22 mins                     Ellamae Sia, PT, DPT Acute Rehabilitation Services Pager [phone] Office [phone]    Willy Eddy 11/09/2017, 11:29 AM

## 2017-11-09 NOTE — Care Management Note (Addendum)
Case Management Note  Patient Details  Name: ANTONIETTA LANSDOWNE MRN: [id number] Date of Birth: 19-Feb-1956  Subjective/Objective:   ETOH  withdrawal symptoms. Hx of alcohol abuse, SVT, depression.  Darlyne [place]) Argelia Formisano (Mother)    [phone]     PCP: Dr. Redmond School  Action/Plan: Transition to home with home health services to follow. Pt has transportation to home.  Expected Discharge Date:  11/09/17               Expected Discharge Plan:  [place]  In-House Referral:     Discharge planning Services  CM Consult  Post Acute Care Choice:    Choice offered to:  Patient  DME Arranged:   n/a DME Agency:   n/a  HH Arranged:  PT, OT, Nurse's Aide, RN [place] Agency:  [place], referral made with [facility]  Status of Service:  Completed, signed off  If discussed at [place] of Stay Meetings, dates discussed:    Additional Comments:  Sharin Mons, RN 11/09/2017, 9:36 AM

## 2017-11-09 NOTE — Progress Notes (Signed)
[name] to be D/C'd  per MD order.  Discussed with the patient and all questions fully answered.  VSS, Skin clean, dry and intact without evidence of skin break down, no evidence of skin tears noted. IV catheter discontinued intact. Site without signs and symptoms of complications. Dressing and pressure applied.  An After Visit Summary was printed and given to the patient. Patient received prescription.  D/c education completed with patient/family including follow up instructions, medication list, d/c activities limitations if indicated, with other d/c instructions as indicated by MD - patient able to verbalize understanding, all questions fully answered.   Patient instructed to return to ED, call 911, or call MD for any changes in condition.   Patient escorted via [place], and D/C home via private auto.   Chynna Buerkle  [place] 11/09/17 1036

## 2017-11-09 NOTE — Progress Notes (Signed)
Completely awake and alert-hardly any tremors-she wants to go home today-claims she has something "urgent" that she needs to deal with at home.  She does not intend to drink anymore-she also does not want to stay in the hospital anymore in spite of me recommending her to stay 1 more day before she is discharged.  She will be discharged at home request.

## 2017-11-10 ENCOUNTER — Telehealth: Payer: Self-pay

## 2017-11-10 NOTE — Telephone Encounter (Signed)
Called pt to schedule a hospital follow up. If patient calls please schedule her by September 19th

## 2017-11-16 ENCOUNTER — Inpatient Hospital Stay: Payer: Federal, State, Local not specified - PPO | Admitting: Family Medicine

## 2017-11-22 ENCOUNTER — Inpatient Hospital Stay: Payer: Federal, State, Local not specified - PPO | Admitting: Family Medicine

## 2017-11-23 ENCOUNTER — Encounter: Payer: Self-pay | Admitting: Family Medicine

## 2017-11-28 DIAGNOSIS — F10939 Alcohol use, unspecified with withdrawal, unspecified: Secondary | ICD-10-CM

## 2017-11-28 DIAGNOSIS — F10239 Alcohol dependence with withdrawal, unspecified: Secondary | ICD-10-CM

## 2017-11-28 HISTORY — DX: Alcohol use, unspecified with withdrawal, unspecified: F10.939

## 2017-11-28 HISTORY — DX: Alcohol dependence with withdrawal, unspecified: F10.239

## 2017-12-07 DIAGNOSIS — R0789 Other chest pain: Secondary | ICD-10-CM | POA: Diagnosis not present

## 2017-12-07 DIAGNOSIS — F19939 Other psychoactive substance use, unspecified with withdrawal, unspecified: Secondary | ICD-10-CM | POA: Diagnosis not present

## 2017-12-07 DIAGNOSIS — R231 Pallor: Secondary | ICD-10-CM | POA: Diagnosis not present

## 2017-12-07 DIAGNOSIS — R079 Chest pain, unspecified: Secondary | ICD-10-CM | POA: Diagnosis not present

## 2017-12-08 ENCOUNTER — Other Ambulatory Visit: Payer: Self-pay

## 2017-12-08 ENCOUNTER — Encounter (HOSPITAL_COMMUNITY): Payer: Self-pay | Admitting: Emergency Medicine

## 2017-12-08 ENCOUNTER — Emergency Department (HOSPITAL_COMMUNITY): Payer: Federal, State, Local not specified - PPO

## 2017-12-08 ENCOUNTER — Observation Stay (HOSPITAL_COMMUNITY)
Admission: EM | Admit: 2017-12-08 | Discharge: 2017-12-10 | Disposition: A | Discharge status: Home / Self Care | Payer: Federal, State, Local not specified - PPO | Attending: Internal Medicine | Admitting: Internal Medicine

## 2017-12-08 DIAGNOSIS — E785 Hyperlipidemia, unspecified: Secondary | ICD-10-CM | POA: Diagnosis not present

## 2017-12-08 DIAGNOSIS — R740 Nonspecific elevation of levels of transaminase and lactic acid dehydrogenase [LDH]: Secondary | ICD-10-CM | POA: Insufficient documentation

## 2017-12-08 DIAGNOSIS — I471 Supraventricular tachycardia, unspecified: Secondary | ICD-10-CM | POA: Diagnosis not present

## 2017-12-08 DIAGNOSIS — Z9049 Acquired absence of other specified parts of digestive tract: Secondary | ICD-10-CM | POA: Diagnosis not present

## 2017-12-08 DIAGNOSIS — Z885 Allergy status to narcotic agent status: Secondary | ICD-10-CM | POA: Insufficient documentation

## 2017-12-08 DIAGNOSIS — D72829 Elevated white blood cell count, unspecified: Secondary | ICD-10-CM | POA: Diagnosis not present

## 2017-12-08 DIAGNOSIS — Z8262 Family history of osteoporosis: Secondary | ICD-10-CM | POA: Insufficient documentation

## 2017-12-08 DIAGNOSIS — Z9889 Other specified postprocedural states: Secondary | ICD-10-CM | POA: Diagnosis not present

## 2017-12-08 DIAGNOSIS — K700 Alcoholic fatty liver: Secondary | ICD-10-CM | POA: Insufficient documentation

## 2017-12-08 DIAGNOSIS — D539 Nutritional anemia, unspecified: Secondary | ICD-10-CM | POA: Insufficient documentation

## 2017-12-08 DIAGNOSIS — I341 Nonrheumatic mitral (valve) prolapse: Secondary | ICD-10-CM | POA: Diagnosis not present

## 2017-12-08 DIAGNOSIS — M810 Age-related osteoporosis without current pathological fracture: Secondary | ICD-10-CM | POA: Insufficient documentation

## 2017-12-08 DIAGNOSIS — F419 Anxiety disorder, unspecified: Secondary | ICD-10-CM | POA: Diagnosis not present

## 2017-12-08 DIAGNOSIS — G43909 Migraine, unspecified, not intractable, without status migrainosus: Secondary | ICD-10-CM | POA: Diagnosis not present

## 2017-12-08 DIAGNOSIS — R0789 Other chest pain: Secondary | ICD-10-CM | POA: Insufficient documentation

## 2017-12-08 DIAGNOSIS — F10930 Alcohol use, unspecified with withdrawal, uncomplicated: Secondary | ICD-10-CM

## 2017-12-08 DIAGNOSIS — H9190 Unspecified hearing loss, unspecified ear: Secondary | ICD-10-CM

## 2017-12-08 DIAGNOSIS — F1020 Alcohol dependence, uncomplicated: Secondary | ICD-10-CM | POA: Diagnosis not present

## 2017-12-08 DIAGNOSIS — F331 Major depressive disorder, recurrent, moderate: Secondary | ICD-10-CM

## 2017-12-08 DIAGNOSIS — F10239 Alcohol dependence with withdrawal, unspecified: Secondary | ICD-10-CM | POA: Diagnosis present

## 2017-12-08 DIAGNOSIS — K811 Chronic cholecystitis: Secondary | ICD-10-CM | POA: Diagnosis present

## 2017-12-08 DIAGNOSIS — I10 Essential (primary) hypertension: Secondary | ICD-10-CM | POA: Diagnosis not present

## 2017-12-08 DIAGNOSIS — Z79899 Other long term (current) drug therapy: Secondary | ICD-10-CM | POA: Insufficient documentation

## 2017-12-08 DIAGNOSIS — F329 Major depressive disorder, single episode, unspecified: Secondary | ICD-10-CM | POA: Insufficient documentation

## 2017-12-08 DIAGNOSIS — F10230 Alcohol dependence with withdrawal, uncomplicated: Principal | ICD-10-CM | POA: Insufficient documentation

## 2017-12-08 DIAGNOSIS — K219 Gastro-esophageal reflux disease without esophagitis: Secondary | ICD-10-CM | POA: Insufficient documentation

## 2017-12-08 DIAGNOSIS — R079 Chest pain, unspecified: Secondary | ICD-10-CM | POA: Diagnosis not present

## 2017-12-08 DIAGNOSIS — Z881 Allergy status to other antibiotic agents status: Secondary | ICD-10-CM | POA: Diagnosis not present

## 2017-12-08 DIAGNOSIS — F10939 Alcohol use, unspecified with withdrawal, unspecified: Secondary | ICD-10-CM | POA: Diagnosis present

## 2017-12-08 HISTORY — DX: Alcohol dependence with withdrawal, unspecified: F10.239

## 2017-12-08 LAB — VITAMIN B12: Vitamin B-12: 546 pg/mL (ref 180–914)

## 2017-12-08 LAB — CBC
HEMATOCRIT: 36.8 % (ref 36.0–46.0)
Hemoglobin: 11.9 g/dL — ABNORMAL LOW (ref 12.0–15.0)
MCH: 33.2 pg (ref 26.0–34.0)
MCHC: 32.3 g/dL (ref 30.0–36.0)
MCV: 102.8 fL — AB (ref 80.0–100.0)
Platelets: 173 10*3/uL (ref 150–400)
RBC: 3.58 MIL/uL — ABNORMAL LOW (ref 3.87–5.11)
RDW: 12.8 % (ref 11.5–15.5)
WBC: 11.6 10*3/uL — AB (ref 4.0–10.5)
nRBC: 0 % (ref 0.0–0.2)

## 2017-12-08 LAB — COMPREHENSIVE METABOLIC PANEL
ALK PHOS: 74 U/L (ref 38–126)
ALT: 17 U/L (ref 0–44)
AST: 75 U/L — ABNORMAL HIGH (ref 15–41)
Albumin: 3.4 g/dL — ABNORMAL LOW (ref 3.5–5.0)
Anion gap: 9 (ref 5–15)
BILIRUBIN TOTAL: 0.7 mg/dL (ref 0.3–1.2)
BUN: 9 mg/dL (ref 8–23)
CO2: 23 mmol/L (ref 22–32)
Calcium: 8.4 mg/dL — ABNORMAL LOW (ref 8.9–10.3)
Chloride: 107 mmol/L (ref 98–111)
Creatinine, Ser: 0.76 mg/dL (ref 0.44–1.00)
GFR calc non Af Amer: >60 mL/min (ref >60)
Glucose, Bld: 138 mg/dL — ABNORMAL HIGH (ref 70–99)
Potassium: 4 mmol/L (ref 3.5–5.1)
SODIUM: 139 mmol/L (ref 135–145)
TOTAL PROTEIN: 6.3 g/dL — AB (ref 6.5–8.1)

## 2017-12-08 LAB — CBG MONITORING, ED: GLUCOSE-CAPILLARY: 127 mg/dL — AB (ref 70–99)

## 2017-12-08 LAB — LIPASE, BLOOD: LIPASE: 39 U/L (ref 11–51)

## 2017-12-08 LAB — I-STAT TROPONIN, ED: Troponin i, poc: 0 ng/mL (ref 0.00–0.08)

## 2017-12-08 LAB — RAPID URINE DRUG SCREEN, HOSP PERFORMED
Amphetamines: NOT DETECTED
BARBITURATES: NOT DETECTED
Benzodiazepines: POSITIVE — AB
COCAINE: NOT DETECTED
Opiates: NOT DETECTED
Tetrahydrocannabinol: NOT DETECTED

## 2017-12-08 LAB — ETHANOL: Alcohol, Ethyl (B): <10 mg/dL (ref <10)

## 2017-12-08 LAB — TROPONIN I

## 2017-12-08 MED ORDER — ONDANSETRON HCL 4 MG/2ML IJ SOLN
4.0000 mg | Freq: Four times a day (QID) | INTRAMUSCULAR | Status: DC | PRN
  Administered 2017-12-08: 4 mg via INTRAVENOUS
  Filled 2017-12-08: qty 2

## 2017-12-08 MED ORDER — VITAMIN B-1 100 MG PO TABS
100.0000 mg | ORAL_TABLET | Freq: Every day | ORAL | Status: DC
  Administered 2017-12-08 – 2017-12-10 (×3): 100 mg via ORAL
  Filled 2017-12-08 (×3): qty 1

## 2017-12-08 MED ORDER — ONDANSETRON HCL 4 MG/2ML IJ SOLN
4.0000 mg | Freq: Once | INTRAMUSCULAR | Status: AC
  Administered 2017-12-08: 4 mg via INTRAVENOUS
  Filled 2017-12-08: qty 2

## 2017-12-08 MED ORDER — LORAZEPAM 2 MG/ML IJ SOLN
0.0000 mg | Freq: Four times a day (QID) | INTRAMUSCULAR | Status: DC
  Administered 2017-12-08: 2 mg via INTRAVENOUS
  Filled 2017-12-08: qty 1

## 2017-12-08 MED ORDER — GI COCKTAIL ~~LOC~~: 30.0000 mL | Freq: Four times a day (QID) | ORAL | Status: DC | PRN

## 2017-12-08 MED ORDER — HYDRALAZINE HCL 20 MG/ML IJ SOLN: 5.0000 mg | INTRAMUSCULAR | Status: DC | PRN

## 2017-12-08 MED ORDER — ASPIRIN 81 MG PO CHEW: 324.0000 mg | CHEWABLE_TABLET | Freq: Once | ORAL | Status: DC

## 2017-12-08 MED ORDER — ENOXAPARIN SODIUM 40 MG/0.4ML ~~LOC~~ SOLN
40.0000 mg | Freq: Every day | SUBCUTANEOUS | Status: DC
  Administered 2017-12-08 – 2017-12-10 (×3): 40 mg via SUBCUTANEOUS
  Filled 2017-12-08 (×3): qty 0.4

## 2017-12-08 MED ORDER — PROPRANOLOL HCL 20 MG PO TABS
20.0000 mg | ORAL_TABLET | Freq: Two times a day (BID) | ORAL | Status: DC
  Administered 2017-12-08 – 2017-12-10 (×5): 20 mg via ORAL
  Filled 2017-12-08 (×6): qty 1

## 2017-12-08 MED ORDER — ACETAMINOPHEN 325 MG PO TABS: 650.0000 mg | ORAL_TABLET | ORAL | Status: DC | PRN

## 2017-12-08 MED ORDER — LORAZEPAM 1 MG PO TABS
0.0000 mg | ORAL_TABLET | Freq: Two times a day (BID) | ORAL | Status: DC
  Administered 2017-12-08: 2 mg via ORAL
  Administered 2017-12-08: 1 mg via ORAL
  Filled 2017-12-08: qty 2
  Filled 2017-12-08: qty 1

## 2017-12-08 MED ORDER — LORAZEPAM 2 MG/ML IJ SOLN: 0.0000 mg | Freq: Two times a day (BID) | INTRAMUSCULAR | Status: DC

## 2017-12-08 MED ORDER — PANTOPRAZOLE SODIUM 40 MG PO TBEC
40.0000 mg | DELAYED_RELEASE_TABLET | Freq: Every day | ORAL | Status: DC
  Administered 2017-12-08 – 2017-12-10 (×3): 40 mg via ORAL
  Filled 2017-12-08 (×3): qty 1

## 2017-12-08 MED ORDER — LORAZEPAM 1 MG PO TABS
0.0000 mg | ORAL_TABLET | Freq: Four times a day (QID) | ORAL | Status: DC
  Administered 2017-12-08 (×2): 2 mg via ORAL
  Administered 2017-12-09: 1 mg via ORAL
  Filled 2017-12-08: qty 1
  Filled 2017-12-08 (×3): qty 2

## 2017-12-08 MED ORDER — NITROGLYCERIN 0.4 MG SL SUBL: 0.4000 mg | SUBLINGUAL_TABLET | SUBLINGUAL | Status: DC | PRN

## 2017-12-08 MED ORDER — THIAMINE HCL 100 MG/ML IJ SOLN
100.0000 mg | Freq: Every day | INTRAMUSCULAR | Status: DC
  Administered 2017-12-08: 100 mg via INTRAVENOUS
  Filled 2017-12-08: qty 2

## 2017-12-08 NOTE — Plan of Care (Signed)
Problem: Education: Goal: Knowledge of General Education information will improve Description Including pain rating scale, medication(s)/side effects and non-pharmacologic comfort measures Outcome: Progressing   Problem: Health Behavior/Discharge Planning: Goal: Ability to manage health-related needs will improve Outcome: Progressing   Problem: Clinical Measurements: Goal: Diagnostic test results will improve Outcome: Progressing   Problem: Coping: Goal: Level of anxiety will decrease Outcome: Progressing   Problem: Pain Managment: Goal: General experience of comfort will improve Outcome: Progressing   Problem: Safety: Goal: Ability to remain free from injury will improve Outcome: Progressing   

## 2017-12-08 NOTE — Progress Notes (Addendum)
[facility] PROGRESS NOTE  Terri Ayala ION:[id number] DOB: [birth year] DOA: 12/08/2017 PCP: Denita Lung, MD  Assessment/Plan: Alcohol abuse with withdrawals: Acute.  Patient reported being an acute alcohol withdrawals.  Initial CIWA score was noted to be 17.  Patient admits to drinking 1-1.5 bottles of wine daily with her last drink being yesterday.  Reports associated symptoms of nausea, dry heaves, diaphoresis, and difficulty breathing.  - Admit to a telemetry bed - CWIA Protocols with scheduled ativan as well as prn. CIWA score 5 -monitor closely   Chest pain: Acute.  Patient reported having substernal chest pain that felt like pressure.   Troponin negative x3.  Chest x-ray showed no acute abnormalities.  Patient had been given 324 mg of aspirin in route along with nitroglycerin without complete relief of symptoms. Pain free this am. ekg with NSR. -monitor -supportive therapy -gi cocktail    Hypertension. Hx of same. Home meds include propanolol. Poor control likely related to above -continue propanolol -prn hydralazine  Leukocytosis: Acute. Initial WBC elevated 11.6.  This x-ray showed no acute abnormalities. Patient hemodynamically stable. Afebrile. - Continue to monitor  Macrocytic anemia: Chronic.  Patient presents with hemoglobin 11.9 with elevated MCV. B12 within limits of normal. Folate pending - Check vitamin B12 and folate -monitor  History of SVT - Continue propranolol  Transaminitis: Acute on chronic.  AST noted to be 75.  Secondary to alcohol abuse  GERD 1. - Continue Protonix  Code Status: full Family Communication: none present Disposition Plan: home when ready   Consultants:  Social work  Procedures:  none  Antibiotics:  none  HPI/Subjective: Sitting up in bed watching TV. Complains nausea. Reports tremors improved.   Terri Ayala is a 62 y.o. female with medical history significant of alcohol abuse, SVT, and depression; who  presented with complaints of chest pain and alcohol withdrawals.  She complained of substernal chest pressure symptoms starting the day prior to presentation.  Reported having associated symptoms of nausea, dry heaves, diaphoresis, and difficulty breathing.  She admited to drinking 1-1.5 bottles of wine per day on average, and reported her last drink sometime day prior to presentation.  She felt as though she was withdrawing since that time. Denied any previous history of withdrawal seizures, fever, or loss of consciousness  Objective: Vitals:   12/08/17 0934 12/08/17 1218  BP: (!) 134/97 (!) 139/102  Pulse: 92 97  Resp:  18  Temp:    SpO2:      Intake/Output Summary (Last 24 hours) at 12/08/2017 1436 Last data filed at 12/08/2017 0953 Gross per 24 hour  Intake 120 ml  Output 200 ml  Net -80 ml   Filed Weights   12/08/17 0642  Weight: 63.6 kg    Exam:   General:  Appears chronically ill. Very HOH  Cardiovascular: tachycardic but regular no mgr no LE edema  Respiratory: normal effort BS distant but clear no crackles  Abdomen: soft +BS but sluggish. No rebound or guarding  Musculoskeletal: joints without swelling/erythema   Data Reviewed: Basic Metabolic Panel: Recent Labs  Lab 12/08/17 0127  NA 139  K 4.0  CL 107  CO2 23  GLUCOSE 138*  BUN 9  CREATININE 0.76  CALCIUM 8.4*   Liver Function Tests: Recent Labs  Lab 12/08/17 0127  AST 75*  ALT 17  ALKPHOS 74  BILITOT 0.7  PROT 6.3*  ALBUMIN 3.4*   Recent Labs  Lab 12/08/17 0132  LIPASE 39   No results for  input(s): AMMONIA in the last 168 hours. CBC: Recent Labs  Lab 12/08/17 0127  WBC 11.6*  HGB 11.9*  HCT 36.8  MCV 102.8*  PLT 173   Cardiac Enzymes: Recent Labs  Lab 12/08/17 0832  TROPONINI <0.03   BNP (last 3 results) No results for input(s): BNP in the last 8760 hours.  ProBNP (last 3 results) No results for input(s): PROBNP in the last 8760 hours.  CBG: Recent Labs  Lab  12/08/17 0121  GLUCAP 127*    No results found for this or any previous visit (from the past 240 hour(s)).   Studies: Dg Chest Port 1 View  Result Date: 12/08/2017 CLINICAL DATA:  Chest pain and alcohol withdrawal. EXAM: PORTABLE CHEST 1 VIEW COMPARISON:  11/06/2017 FINDINGS: The heart size and mediastinal contours are within normal limits. Both lungs are clear. The visualized skeletal structures are unremarkable. IMPRESSION: No active disease. Electronically Signed   By: Ulyses Jarred M.D.   On: 12/08/2017 04:00    Scheduled Meds: . enoxaparin (LOVENOX) injection  40 mg Subcutaneous Daily  . LORazepam  0-4 mg Intravenous Q6H   Or  . LORazepam  0-4 mg Oral Q6H  . LORazepam  0-4 mg Intravenous Q12H   Or  . LORazepam  0-4 mg Oral Q12H  . pantoprazole  40 mg Oral Q1200  . propranolol  20 mg Oral BID  . thiamine  100 mg Oral Daily   Or  . thiamine  100 mg Intravenous Daily   Continuous Infusions:  Principal Problem:   Alcohol withdrawal (HCC) Active Problems:   Chest pain   Chronic cholecystitis   Leukocytosis   Essential hypertension   Hearing loss   SVT (supraventricular tachycardia) ([place])    Time spent: 35 minutes    [facility]  If 7PM-7AM, please contact night-coverage at [email or web address], password [facility] 12/08/2017, 2:36 PM  LOS: 0 days

## 2017-12-08 NOTE — Plan of Care (Signed)

## 2017-12-08 NOTE — ED Provider Notes (Signed)
[facility] EMERGENCY DEPARTMENT Provider Note   CSN: [id number] Arrival date & time: 12/08/17  0105     History   Chief Complaint Chief Complaint  Patient presents with  . Chest Pain  . Alcohol Intoxication    HPI Terri Ayala is a 62 y.o. female.  She presents to the emergency department for evaluation of chest pain and alcohol withdrawal.  Patient reports that she drank heavily yesterday.  She had wine earlier this morning but then did not have anything else to drink the rest of the day.  Around noon today she developed chest pain and then over the course of the rest of the day started to have withdrawal symptoms including severe shakiness, weakness.  Patient reports nausea and vomiting in the ambulance tonight.  She still feels nausea.  She reports that the pain that she is experience in her chest is typical for her when she has alcohol withdrawal.  She is not short of breath.     Past Medical History:  Diagnosis Date  . Alcoholism ([place])   . Anxiety   . ASYMPTOMATIC POSTMENOPAUSAL STATUS 08/17/2009  . DYSPHAGIA 06/19/2007  . Fatty liver, alcoholic   . GOITER, MULTINODULAR 08/17/2009  . Hyperlipidemia   . Low back pain   . Migraines   . Mitral valve prolapse   . OSTEOPOROSIS 08/17/2009  . PONV (postoperative nausea and vomiting)   . Supraventricular tachycardia [facility])     Patient Active Problem List   Diagnosis Date Noted  . Alcohol withdrawal ([place]) 07/21/2017  . Major depressive disorder, recurrent episode, moderate ([place])   . Alcohol use disorder, severe, dependence ([place]) 04/03/2014  . Lumbosacral radiculopathy at L5 05/20/2013  . Nonspecific elevation of levels of transaminase or lactic acid dehydrogenase (LDH) 03/16/2013  . Hepatic steatosis 03/16/2013  . Chronic cholecystitis 03/14/2013  . Cervical arthritis 10/12/2012  . Palpitations 11/29/2010  . SVT (supraventricular tachycardia) ([place]) 11/29/2010  . Mitral valve prolapse 11/29/2010  .  Decreased libido 11/15/2010  . GOITER, MULTINODULAR 08/17/2009  . ANXIETY 08/17/2009  . Migraine headache 08/17/2009  . HEARING LOSS 08/17/2009  . OSTEOPOROSIS 08/17/2009  . ASYMPTOMATIC POSTMENOPAUSAL STATUS 08/17/2009  . DYSPHAGIA 06/19/2007    Past Surgical History:  Procedure Laterality Date  . BREAST BIOPSY Right   . CERVIX SURGERY    . CHOLECYSTECTOMY N/A 03/14/2013   Procedure: LAPAROSCOPIC CHOLECYSTECTOMY WITH ATTEMPTED INTRAOPERATIVE CHOLANGIOGRAM;  Surgeon: Harl Bowie, MD;  Location: [facility];  Service: General;  Laterality: N/A;  . laporoscopic abdominal surgery       OB History   None      Home Medications    Prior to Admission medications   Medication Sig Start Date End Date Taking? Authorizing Provider  alendronate (FOSAMAX) 70 MG tablet Take 70 mg by mouth once a week. Take with a full glass of water on an empty stomach.    [provider]  busPIRone (BUSPAR) 15 MG tablet Take 15 mg by mouth 3 (three) times daily.    [provider]  chlordiazePOXIDE (LIBRIUM) 25 MG capsule 25 mg PO BID x 1Day, then 12.5mg  PO BID X 1Day, then 12.5 mg PO QD X 1Day 11/09/17   Ghimire, Henreitta Leber, MD  DULoxetine (CYMBALTA) 60 MG capsule TAKE 1 CAPSULE (60 MG TOTAL) BY MOUTH DAILY. Patient taking differently: Take 60 mg by mouth daily.  10/23/14   Denita Lung, MD  hydrOXYzine (ATARAX/VISTARIL) 10 MG tablet Take 10 mg by mouth 2 (two) times  daily. 07/10/17   [provider]  meloxicam (MOBIC) 15 MG tablet Take 15 mg by mouth daily. 09/29/17   [provider]  naltrexone (DEPADE) 50 MG tablet Take 50 mg by mouth daily. 07/10/17   [provider]  norethindrone-ethinyl estradiol (FEMHRT 1/5) 1-5 MG-MCG TABS Take 1 tablet by mouth daily. For Osteoporosis 04/07/14   Lindell Spar I, NP  ondansetron (ZOFRAN ODT) 4 MG disintegrating tablet Take 1 tablet (4 mg total) by mouth every 8 (eight) hours as needed for nausea or vomiting. 09/01/17   Henson,  Vickie L, NP-C  pantoprazole (PROTONIX) 40 MG tablet Take 1 tablet (40 mg total) by mouth daily at 12 noon. 11/09/17   Ghimire, Henreitta Leber, MD  propranolol (INDERAL) 10 MG tablet Take 20 mg by mouth 2 (two) times daily.     [provider]  SUMAtriptan (IMITREX) 100 MG tablet Take 1 tablet at the first sign of migraine.  May repeat in 2 hours. 07/31/17   Denita Lung, MD  thiamine 100 MG tablet Take 1 tablet (100 mg total) by mouth daily. 11/09/17   Ghimire, Henreitta Leber, MD  traZODone (DESYREL) 50 MG tablet Take 50 mg by mouth at bedtime as needed for sleep.     [provider]    Family History Family History  Problem Relation Age of Onset  . Colon cancer Father        Deceased, 30  . Osteoporosis Mother        Living, 18  . Healthy Brother   . Thyroid disease Neg Hx   . Goiter Neg Hx     Social History Social History   Tobacco Use  . Smoking status: Never Smoker  . Smokeless tobacco: Never Used  Substance Use Topics  . Alcohol use: Yes    Comment: bottle of wine daily  . Drug use: No     Allergies   Doxycycline; Codeine; and Tetracycline hcl   Review of Systems Review of Systems  Cardiovascular: Positive for chest pain.  Neurological: Positive for tremors.  All other systems reviewed and are negative.    Physical Exam Updated Vital Signs BP 121/70   Pulse 88   Temp 97.9 F (36.6 C) (Oral)   Resp 19   SpO2 97%   Physical Exam  Constitutional: She is oriented to person, place, and time. She appears well-developed and well-nourished. No distress.  HENT:  Head: Normocephalic and atraumatic.  Right Ear: Hearing normal.  Left Ear: Hearing normal.  Nose: Nose normal.  Mouth/Throat: Oropharynx is clear and moist and mucous membranes are normal.  Eyes: Pupils are equal, round, and reactive to light. Conjunctivae and EOM are normal.  Neck: Normal range of motion. Neck supple.  Cardiovascular: Regular rhythm, S1 normal and S2 normal. Exam reveals  no gallop and no friction rub.  No murmur heard. Pulmonary/Chest: Effort normal and breath sounds normal. No respiratory distress. She exhibits no tenderness.  Abdominal: Soft. Normal appearance and bowel sounds are normal. There is no hepatosplenomegaly. There is no tenderness. There is no rebound, no guarding, no tenderness at McBurney's point and negative Murphy's sign. No hernia.  Musculoskeletal: Normal range of motion.  Neurological: She is alert and oriented to person, place, and time. She has normal strength. She displays tremor. No cranial nerve deficit or sensory deficit. Coordination normal. GCS eye subscore is 4. GCS verbal subscore is 5. GCS motor subscore is 6.  Skin: Skin is warm, dry and intact. No rash noted.  No cyanosis.  Psychiatric: She has a normal mood and affect. Her speech is normal and behavior is normal. Thought content normal.  Nursing note and vitals reviewed.    ED Treatments / Results  Labs (all labs ordered are listed, but only abnormal results are displayed) Labs Reviewed  COMPREHENSIVE METABOLIC PANEL - Abnormal; Notable for the following components:      Result Value   Glucose, Bld 138 (*)    Calcium 8.4 (*)    Total Protein 6.3 (*)    Albumin 3.4 (*)    AST 75 (*)    All other components within normal limits  CBC - Abnormal; Notable for the following components:   WBC 11.6 (*)    RBC 3.58 (*)    Hemoglobin 11.9 (*)    MCV 102.8 (*)    All other components within normal limits  CBG MONITORING, ED - Abnormal; Notable for the following components:   Glucose-Capillary 127 (*)    All other components within normal limits  ETHANOL  LIPASE, BLOOD  RAPID URINE DRUG SCREEN, HOSP PERFORMED  I-STAT TROPONIN, ED    EKG EKG Interpretation  Date/Time:  Friday December 08 2017 01:07:19 EDT Ventricular Rate:  93 PR Interval:    QRS Duration: 87 QT Interval:  397 QTC Calculation: 494 R Axis:   21 Text Interpretation:  Sinus rhythm Borderline prolonged  QT interval Confirmed by Orpah Greek [phone]) on 12/08/2017 1:30:30 AM   Radiology Dg Chest Port 1 View  Result Date: 12/08/2017 CLINICAL DATA:  Chest pain and alcohol withdrawal. EXAM: PORTABLE CHEST 1 VIEW COMPARISON:  11/06/2017 FINDINGS: The heart size and mediastinal contours are within normal limits. Both lungs are clear. The visualized skeletal structures are unremarkable. IMPRESSION: No active disease. Electronically Signed   By: Ulyses Jarred M.D.   On: 12/08/2017 04:00    Procedures Procedures (including critical care time)  Medications Ordered in ED Medications  LORazepam (ATIVAN) injection 0-4 mg (2 mg Intravenous Given 12/08/17 0149)    Or  LORazepam (ATIVAN) tablet 0-4 mg ( Oral See Alternative 12/08/17 0149)  LORazepam (ATIVAN) injection 0-4 mg (has no administration in time range)    Or  LORazepam (ATIVAN) tablet 0-4 mg (has no administration in time range)  thiamine (VITAMIN B-1) tablet 100 mg ( Oral See Alternative 12/08/17 0149)    Or  thiamine (B-1) injection 100 mg (100 mg Intravenous Given 12/08/17 0149)  ondansetron (ZOFRAN) injection 4 mg (4 mg Intravenous Given 12/08/17 0149)     Initial Impression / Assessment and Plan / ED Course  I have reviewed the triage vital signs and the nursing notes.  Pertinent labs & imaging results that were available during my care of the patient were reviewed by me and considered in my medical decision making (see chart for details).     Patient presents to the emergency department for evaluation of chest pain and shakiness.  Patient reports that she has not had anything to drink for most of today and is starting to have symptoms of withdrawal.  She has a history of chronic alcoholism with severe withdrawal in the past.  She is experiencing chest pain today.  She reports that she frequently has this when she is having symptoms of alcohol withdrawal, but does have cardiac risk factors.  EKG is unremarkable, first  troponin is negative.  She will require serial troponins to rule out cardiac etiology.  Patient had a CIWA 17 at arrival.  She was placed on protocol,  administered Ativan, thiamine.  CRITICAL CARE Performed by: Orpah Greek   Total critical care time: 30 minutes  Critical care time was exclusive of separately billable procedures and treating other patients.  Critical care was necessary to treat or prevent imminent or life-threatening deterioration.  Critical care was time spent personally by me on the following activities: development of treatment plan with patient and/or surrogate as well as nursing, discussions with consultants, evaluation of patient's response to treatment, examination of patient, obtaining history from patient or surrogate, ordering and performing treatments and interventions, ordering and review of laboratory studies, ordering and review of radiographic studies, pulse oximetry and re-evaluation of patient's condition.   Final Clinical Impressions(s) / ED Diagnoses   Final diagnoses:  Chest pain, unspecified type  Alcohol withdrawal syndrome without complication [facility])    ED Discharge Orders    None       Orpah Greek, MD 12/08/17 0425

## 2017-12-08 NOTE — Clinical Social Work Note (Signed)
Clinical Social Work Assessment  Patient Details  Name: Terri Ayala MRN: [id number] Date of Birth: August 28, 1955  Date of referral:  12/08/17               Reason for consult:  Substance Use/ETOH Abuse                Permission sought to share information with:    Permission granted to share information::  No  Name::        Agency::     Relationship::     Contact Information:     Housing/Transportation Living arrangements for the past 2 months:  Single Family Home Source of Information:  Patient, Medical Team Patient Interpreter Needed:  None Criminal Activity/Legal Involvement Pertinent to Current Situation/Hospitalization:  No - Comment as needed Significant Relationships:  Friend, Parents Lives with:  Self Do you feel safe going back to the place where you live?  Yes Need for family participation in patient care:  Yes (Comment)  Care giving concerns:  ETOH abuse.   Social Worker assessment / plan:  CSW met with patient. No supports at bedside. CSW introduced role and inquired about interest in substance abuse resources, per RN in morning progression. Patient is agreeable and confirmed she lives in [place]. CSW provided packet with list of residential and outpatient treatment options within [facility] and the surrounding areas. CSW encouraged patient to contact CSW if she finds one she is interested in prior to discharge. No further concerns. CSW signing off as social work intervention is no longer needed.  Employment status:  [employer] information:  Other (Comment Required)([employer]) PT Recommendations:  Not assessed at this time Information / Referral to community resources:  Residential Substance Abuse Treatment Options, Outpatient Substance Abuse Treatment Options  Patient/Family's Response to care:  Patient agreeable to receiving resources. Patient's friend and mother supportive and involved in patient's care. Per RN, family lives out of town. Patient appreciated  social work intervention.  Patient/Family's Understanding of and Emotional Response to Diagnosis, Current Treatment, and Prognosis:  Patient has a good understanding of the reason for admission and social work intervention. Patient appears happy with hospital care.  Emotional Assessment Appearance:  Appears stated age Attitude/Demeanor/Rapport:  Engaged, Gracious Affect (typically observed):  Accepting, Appropriate, Calm, Pleasant Orientation:  Oriented to Self, Oriented to Place, Oriented to  Time, Oriented to Situation Alcohol / Substance use:  Alcohol Use Psych involvement (Current and /or in the community):  No (Comment)  Discharge Needs  Concerns to be addressed:  Care Coordination Readmission within the last 30 days:  No Current discharge risk:  Substance Abuse Barriers to Discharge:  Continued Medical Work up   Candie Chroman, LCSW 12/08/2017, 12:12 PM

## 2017-12-08 NOTE — H&P (Signed)
History and Physical    Terri Ayala IFO:[id number] DOB: March 04, 1955 DOA: 12/08/2017  Referring MD/NP/PA: Joseph Berkshire PCP: Denita Lung, MD  Patient coming from: Via EMS  Chief Complaint: Chest pain and alcohol withdrawal  I have personally briefly reviewed patient's old medical records in [place]   HPI: Terri Ayala is a 62 y.o. female with medical history significant of alcohol abuse, SVT, and depression; who presents with complaints of chest pain and alcohol withdrawals.  She complains of substernal chest pressure symptoms starting yesterday afternoon around noon.  Reports having associated symptoms of nausea, dry heaves, diaphoresis, and difficulty breathing.  She admits to drinking 1-1.5 bottles of wine per day on average, and reports her last drink sometime yesterday morning.  She feels as though she is withdrawing since that time. Denies any previous history of withdrawal seizures, fever, or loss of consciousness.  Patient was admitted into the hospital on 9/9-9/12, for alcohol withdrawals.  During that hospital stay patient had SVT in the ED that resolved after 12 mg of adenosine x1 dose.  She also complained of chest pain that was thought likely secondary to alcoholic gastritis with negative troponins. Patient was given 324 mg aspirin in route along with 2 nitroglycerin.  ED Course: Upon admission into the emergency department patient was noted to have vital signs within normal limits.  Labs revealed WBC 11.6, hemoglobin 11.9, AST 75, troponin 0, and alcohol level undetectable.  X-ray showed no acute abnormalities.  Patient was started on CWIA protocols with Ativan as initial score noted to be 17.  TRH called to admit.  Review of Systems  Constitutional: Positive for diaphoresis and malaise/fatigue. Negative for chills.  HENT: Positive for hearing loss. Negative for congestion.   Eyes: Negative for double vision and photophobia.  Respiratory: Positive for  shortness of breath. Negative for hemoptysis.   Cardiovascular: Positive for chest pain. Negative for leg swelling.  Gastrointestinal: Positive for nausea. Negative for abdominal pain and vomiting.  Genitourinary: Negative for dysuria and hematuria.  Musculoskeletal: Negative for falls.  Neurological: Positive for tremors. Negative for seizures and loss of consciousness.  Psychiatric/Behavioral: Positive for substance abuse. The patient is nervous/anxious.     Past Medical History:  Diagnosis Date  . Alcoholism ([place])   . Anxiety   . ASYMPTOMATIC POSTMENOPAUSAL STATUS 08/17/2009  . DYSPHAGIA 06/19/2007  . Fatty liver, alcoholic   . GOITER, MULTINODULAR 08/17/2009  . Hyperlipidemia   . Low back pain   . Migraines   . Mitral valve prolapse   . OSTEOPOROSIS 08/17/2009  . PONV (postoperative nausea and vomiting)   . Supraventricular tachycardia [facility])     Past Surgical History:  Procedure Laterality Date  . BREAST BIOPSY Right   . CERVIX SURGERY    . CHOLECYSTECTOMY N/A 03/14/2013   Procedure: LAPAROSCOPIC CHOLECYSTECTOMY WITH ATTEMPTED INTRAOPERATIVE CHOLANGIOGRAM;  Surgeon: Harl Bowie, MD;  Location: [place] [facility];  Service: General;  Laterality: N/A;  . laporoscopic abdominal surgery       reports that she has never smoked. She has never used smokeless tobacco. She reports that she drinks alcohol. She reports that she does not use drugs.  Allergies  Allergen Reactions  . Doxycycline Swelling    Mouth swelling and sores  . Codeine Itching  . Tetracycline Hcl Swelling    Mouth swelling and sores    Family History  Problem Relation Age of Onset  . Colon cancer Father        Deceased, 65  .  Osteoporosis Mother        Living, 79  . Healthy Brother   . Thyroid disease Neg Hx   . Goiter Neg Hx     Prior to Admission medications   Medication Sig Start Date End Date Taking? Authorizing Provider  alendronate (FOSAMAX) 70 MG tablet Take 70 mg by mouth once a week. Take with a  full glass of water on an empty stomach.    [provider]  busPIRone (BUSPAR) 15 MG tablet Take 15 mg by mouth 3 (three) times daily.    [provider]  chlordiazePOXIDE (LIBRIUM) 25 MG capsule 25 mg PO BID x 1Day, then 12.5mg  PO BID X 1Day, then 12.5 mg PO QD X 1Day 11/09/17   Ghimire, Henreitta Leber, MD  DULoxetine (CYMBALTA) 60 MG capsule TAKE 1 CAPSULE (60 MG TOTAL) BY MOUTH DAILY. Patient taking differently: Take 60 mg by mouth daily.  10/23/14   Denita Lung, MD  hydrOXYzine (ATARAX/VISTARIL) 10 MG tablet Take 10 mg by mouth 2 (two) times daily. 07/10/17   [provider]  meloxicam (MOBIC) 15 MG tablet Take 15 mg by mouth daily. 09/29/17   [provider]  naltrexone (DEPADE) 50 MG tablet Take 50 mg by mouth daily. 07/10/17   [provider]  norethindrone-ethinyl estradiol (FEMHRT 1/5) 1-5 MG-MCG TABS Take 1 tablet by mouth daily. For Osteoporosis 04/07/14   Lindell Spar I, NP  ondansetron (ZOFRAN ODT) 4 MG disintegrating tablet Take 1 tablet (4 mg total) by mouth every 8 (eight) hours as needed for nausea or vomiting. 09/01/17   Henson, Vickie L, NP-C  pantoprazole (PROTONIX) 40 MG tablet Take 1 tablet (40 mg total) by mouth daily at 12 noon. 11/09/17   Ghimire, Henreitta Leber, MD  propranolol (INDERAL) 10 MG tablet Take 20 mg by mouth 2 (two) times daily.     [provider]  SUMAtriptan (IMITREX) 100 MG tablet Take 1 tablet at the first sign of migraine.  May repeat in 2 hours. 07/31/17   Denita Lung, MD  thiamine 100 MG tablet Take 1 tablet (100 mg total) by mouth daily. 11/09/17   Ghimire, Henreitta Leber, MD  traZODone (DESYREL) 50 MG tablet Take 50 mg by mouth at bedtime as needed for sleep.     [provider]    Physical Exam:  Constitutional: Older female who appears to be in distress but able to follow commands Vitals:   12/08/17 0330 12/08/17 0345 12/08/17 0415 12/08/17 0430  BP: 123/75 121/70 121/86 126/87  Pulse: 85 88 97 93    Resp: 18 19 14 17   Temp:      TempSrc:      SpO2: 97% 97% 98% 97%   Eyes: PERRL, lids and conjunctivae normal ENMT: Mucous membranes are moist. Posterior pharynx clear of any exudate or lesions.patient wears hearing aids. Neck: normal, supple, no masses, no thyromegaly Respiratory: clear to auscultation bilaterally, no wheezing, no crackles. Normal respiratory effort. No accessory muscle use.  Cardiovascular: Regular rate and rhythm, no murmurs / rubs / gallops. No extremity edema. 2+ pedal pulses. No carotid bruits.  Abdomen: no tenderness, no masses palpated. No hepatosplenomegaly. Bowel sounds positive.  Musculoskeletal: no clubbing / cyanosis. No joint deformity upper and lower extremities. Good ROM, no contractures. Normal muscle tone.  Skin: no rashes, lesions, ulcers. No induration Neurologic: CN 2-12 grossly intact. Sensation intact, DTR normal. Strength 5/5 in all 4.  Patient tremulous. Psychiatric: Fair judgment and insight. Alert and oriented x  3.  Anxious mood.    Labs on Admission: I have personally reviewed following labs and imaging studies  CBC: Recent Labs  Lab 12/08/17 0127  WBC 11.6*  HGB 11.9*  HCT 36.8  MCV 102.8*  PLT 465   Basic Metabolic Panel: Recent Labs  Lab 12/08/17 0127  NA 139  K 4.0  CL 107  CO2 23  GLUCOSE 138*  BUN 9  CREATININE 0.76  CALCIUM 8.4*   GFR: CrCl cannot be calculated (Unknown ideal weight.). Liver Function Tests: Recent Labs  Lab 12/08/17 0127  AST 75*  ALT 17  ALKPHOS 74  BILITOT 0.7  PROT 6.3*  ALBUMIN 3.4*   Recent Labs  Lab 12/08/17 0132  LIPASE 39   No results for input(s): AMMONIA in the last 168 hours. Coagulation Profile: No results for input(s): INR, PROTIME in the last 168 hours. Cardiac Enzymes: No results for input(s): CKTOTAL, CKMB, CKMBINDEX, TROPONINI in the last 168 hours. BNP (last 3 results) No results for input(s): PROBNP in the last 8760 hours. HbA1C: No results for input(s):  HGBA1C in the last 72 hours. CBG: Recent Labs  Lab 12/08/17 0121  GLUCAP 127*   Lipid Profile: No results for input(s): CHOL, HDL, LDLCALC, TRIG, CHOLHDL, LDLDIRECT in the last 72 hours. Thyroid Function Tests: No results for input(s): TSH, T4TOTAL, FREET4, T3FREE, THYROIDAB in the last 72 hours. Anemia Panel: No results for input(s): VITAMINB12, FOLATE, FERRITIN, TIBC, IRON, RETICCTPCT in the last 72 hours. Urine analysis:    Component Value Date/Time   COLORURINE AMBER (A) 07/20/2017 2240   APPEARANCEUR HAZY (A) 07/20/2017 2240   LABSPEC 1.030 09/01/2017 1443   PHURINE 5.0 07/20/2017 2240   GLUCOSEU NEGATIVE 07/20/2017 2240   HGBUR NEGATIVE 07/20/2017 2240   BILIRUBINUR moderate (A) 09/01/2017 1443   KETONESUR negative 09/01/2017 1443   KETONESUR 5 (A) 07/20/2017 2240   PROTEINUR =30 (A) 09/01/2017 1443   PROTEINUR 100 (A) 07/20/2017 2240   UROBILINOGEN 0.2 07/25/2007 0900   NITRITE Negative 09/01/2017 1443   NITRITE NEGATIVE 07/20/2017 2240   LEUKOCYTESUR Moderate (2+) (A) 09/01/2017 1443   Sepsis Labs: No results found for this or any previous visit (from the past 240 hour(s)).   Radiological Exams on Admission: Dg Chest Port 1 View  Result Date: 12/08/2017 CLINICAL DATA:  Chest pain and alcohol withdrawal. EXAM: PORTABLE CHEST 1 VIEW COMPARISON:  11/06/2017 FINDINGS: The heart size and mediastinal contours are within normal limits. Both lungs are clear. The visualized skeletal structures are unremarkable. IMPRESSION: No active disease. Electronically Signed   By: Ulyses Jarred M.D.   On: 12/08/2017 04:00    EKG: Independently reviewed.  93 bpm with QTc 494.  Assessment/Plan  Alcohol abuse with withdrawals: Acute.  Patient reports being an acute alcohol withdrawals.  Initial CIWA score was noted to be 17.  Patient admits to drinking 1-1.5 bottles of wine daily with her last drink being yesterday.  Reports associated symptoms of nausea, dry heaves, diaphoresis, and  difficulty breathing.  - Admit to a telemetry bed - CWIA Protocols   Chest pain: Acute.  Patient reports having substernal chest pain that feels like pressure.   Troponin negative.  Chest x-ray showed no acute abnormalities.  Patient had been given 324 mg of aspirin in route along with nitroglycerin without complete relief of symptoms. - Trend cardiac troponins - Nitroglycerin prn chest pain - GI Cocktail prn indigestion - Follow-up urine drug - Consider need of echocardiogram and formal cardiology consult in a.m.  Leukocytosis: Acute. Initial WBC elevated 11.6.  This x-ray showed no acute abnormalities. - Continue to monitor  Macrocytic anemia: Chronic.  Patient presents with hemoglobin 11.9 with elevated MCV. - Check vitamin B12 and folate  History of SVT - Continue propranolol  Transaminitis: Acute on chronic.  AST noted to be 75.  Secondary to alcohol abuse  GERD - Continue Protonix  DVT prophylaxis: Lovenox Code Status: Full Family Communication: No family present at bedside Disposition Plan: To the determined Consults called: None Admission status: Observation  Norval Morton MD [facility] Pager [phone]   If 7PM-7AM, please contact night-coverage [email or web address] Password [email or web address]  12/08/2017, 4:48 AM

## 2017-12-08 NOTE — ED Triage Notes (Signed)
  Patient BIB EMS for chest pain since noon this afternoon.  Patient started having chest pains at noon and has been withdrawing from ETOH use.  Patient states she drank a lot on Wednesday night and had a full bottle of wine on Thursday morning.  States she has not had anything else since and has been trying to sleep/sweat it out.  Patient called EMS after chest pain would not get any better and states this happens when she withdraws.  Patient is A&O x4.  5/10 pain.  324 ASA and 2 nitro in route, with 4mg  zofran.

## 2017-12-08 NOTE — Care Management Note (Signed)
Case Management Note  Patient Details  Name: Terri Ayala MRN: [id number] Date of Birth: 02-17-1956  Subjective/Objective:  CP with ETOH withdrawl                 Action/Plan: Patient recently discharged from the hospital; PCP: Denita Lung, MD; has private insurance with [place] with prescription drug coverage; is active with [facility] for HHRN/PT/OT and aide as prior to admission; CM will continue to follow for progression of care.  Expected Discharge Date:    possibly 12/13/2017              Expected Discharge Plan:  [place]  In-House Referral:     Discharge planning Services  CM Consult  HH Arranged:  RN, PT, OT, Nurse's Aide [facility] Agency:  [place]  Status of Service:  In process, will continue to follow  Sherrilyn Rist [phone] 12/08/2017, 11:17 AM

## 2017-12-08 NOTE — Progress Notes (Signed)
   12/08/17 0649  Vitals  Temp 98.5 F (36.9 C)  Temp Source Oral  BP (!) 130/93  MAP (mmHg) 105  BP Method Automatic  Patient Position (if appropriate) Lying  Pulse Rate 99  Resp (!) 24  Oxygen Therapy  SpO2 100 %  O2 Device Room Air  Admitted pt to rm 3E26 from ED, pt alert and oriented, denied pain at this time, oriented to room, call bell placed within reach, placed on cardiac monitor, CCMD made aware.

## 2017-12-09 DIAGNOSIS — F10230 Alcohol dependence with withdrawal, uncomplicated: Secondary | ICD-10-CM | POA: Diagnosis not present

## 2017-12-09 DIAGNOSIS — I10 Essential (primary) hypertension: Secondary | ICD-10-CM | POA: Diagnosis not present

## 2017-12-09 LAB — CBC
HCT: 36.9 % (ref 36.0–46.0)
Hemoglobin: 12.2 g/dL (ref 12.0–15.0)
MCH: 34.1 pg — AB (ref 26.0–34.0)
MCHC: 33.1 g/dL (ref 30.0–36.0)
MCV: 103.1 fL — AB (ref 80.0–100.0)
PLATELETS: 161 10*3/uL (ref 150–400)
RBC: 3.58 MIL/uL — ABNORMAL LOW (ref 3.87–5.11)
RDW: 12.4 % (ref 11.5–15.5)
WBC: 8.1 10*3/uL (ref 4.0–10.5)
nRBC: 0 % (ref 0.0–0.2)

## 2017-12-09 LAB — BASIC METABOLIC PANEL
ANION GAP: 10 (ref 5–15)
BUN: 9 mg/dL (ref 8–23)
CALCIUM: 8.5 mg/dL — AB (ref 8.9–10.3)
CO2: 25 mmol/L (ref 22–32)
Chloride: 103 mmol/L (ref 98–111)
Creatinine, Ser: 0.8 mg/dL (ref 0.44–1.00)
GFR calc Af Amer: >60 mL/min (ref >60)
GLUCOSE: 81 mg/dL (ref 70–99)
Potassium: 3.9 mmol/L (ref 3.5–5.1)
Sodium: 138 mmol/L (ref 135–145)

## 2017-12-09 MED ORDER — LORAZEPAM 2 MG/ML IJ SOLN: 1.0000 mg | Freq: Four times a day (QID) | INTRAMUSCULAR | Status: DC | PRN

## 2017-12-09 MED ORDER — LORAZEPAM 1 MG PO TABS
1.0000 mg | ORAL_TABLET | Freq: Four times a day (QID) | ORAL | Status: DC | PRN
  Administered 2017-12-09 – 2017-12-10 (×2): 1 mg via ORAL
  Filled 2017-12-09 (×2): qty 1

## 2017-12-09 NOTE — Evaluation (Signed)
Physical Therapy Evaluation Patient Details Name: Terri Ayala MRN: [id number] DOB: 1955-05-17 Today's Date: 12/09/2017   History of Present Illness  Pt is a 62 y.o. F with significant PMH for alcohol abuse, SVT, depression, presenting to ED with reports of alcohol withdrawal symptoms. drinks 2-3 bottles of wine daily. Also reports mid central chest pain, nonradiating. Admitted for alcohol withdrawal, SVT and chest pain.  Clinical Impression  Pt admitted with above diagnosis. Pt currently with functional limitations due to the deficits listed below (see PT Problem List). Patient is independent at baseline but does endorse history of falls while drinking. Currently ambulating with one person handheld assist for a distance of 180 feet; demonstrates mild unsteadiness and decreased gait speed. Pt will benefit from skilled PT to increase their independence and safety with mobility to allow discharge to the venue listed below.    Of note, patient voicing concerns about her frequent readmissions, anxiety medication, and desire to quit drinking. She is being seen at an outpatient behavioral specialist. Would inpatient vs outpatient psych consult be warranted?     Follow Up Recommendations Supervision for mobility/OOB;Home health PT    Equipment Recommendations  None recommended by PT    Recommendations for Other Services       Precautions / Restrictions Precautions Precautions: Fall Restrictions Weight Bearing Restrictions: No      Mobility  Bed Mobility Overal bed mobility: Modified Independent                Transfers Overall transfer level: Needs assistance Equipment used: None Transfers: Sit to/from Stand Sit to Stand: Supervision            Ambulation/Gait Ambulation/Gait assistance: Min guard Gait Distance (Feet): 180 Feet Assistive device: 1 person hand held assist Gait Pattern/deviations: Step-through pattern;Decreased stride length Gait velocity: decr Gait  velocity interpretation: <1.8 ft/sec, indicate of risk for recurrent falls General Gait Details: Patient with mild unsteadiness requiring one person hand held assist. Slow cadence and tends to look down frequently  Stairs            Wheelchair Mobility    Modified Rankin (Stroke Patients Only)       Balance Overall balance assessment: Needs assistance Sitting-balance support: No upper extremity supported;Feet supported Sitting balance-Leahy Scale: Good     Standing balance support: No upper extremity supported;During functional activity Standing balance-Leahy Scale: Fair                               Pertinent Vitals/Pain Pain Assessment: No/denies pain    Home Living Family/patient expects to be discharged to:: Private residence Living Arrangements: Alone Available Help at Discharge: Friend(s);Available 24 hours/day Type of Home: House Home Access: Level entry     Home Layout: Able to live on main level with bedroom/bathroom Home Equipment: Walker - 2 wheels;Cane - single point      Prior Function Level of Independence: Independent         Comments: Endorses history of falls while drinking     Hand Dominance        Extremity/Trunk Assessment   Upper Extremity Assessment Upper Extremity Assessment: Overall WFL for tasks assessed    Lower Extremity Assessment Lower Extremity Assessment: Overall WFL for tasks assessed    Cervical / Trunk Assessment Cervical / Trunk Assessment: Normal  Communication   Communication: HOH  Cognition Arousal/Alertness: Awake/alert Behavior During Therapy: WFL for tasks assessed/performed Overall Cognitive Status: Within Functional Limits  for tasks assessed                                        General Comments      Exercises     Assessment/Plan    PT Assessment Patient needs continued PT services  PT Problem List Decreased strength;Decreased activity tolerance;Decreased  balance;Decreased mobility       PT Treatment Interventions DME instruction;Gait training;Stair training;Functional mobility training;Therapeutic activities;Balance training;Therapeutic exercise;Patient/family education    PT Goals (Current goals can be found in the Care Plan section)  Acute Rehab PT Goals Patient Stated Goal: "change my anxiety medication to stop drinking." PT Goal Formulation: With patient Time For Goal Achievement: 12/23/17 Potential to Achieve Goals: Good    Frequency Min 3X/week   Barriers to discharge Decreased caregiver support      Co-evaluation               AM-PAC PT "6 Clicks" Daily Activity  Outcome Measure Difficulty turning over in bed (including adjusting bedclothes, sheets and blankets)?: None Difficulty moving from lying on back to sitting on the side of the bed? : None Difficulty sitting down on and standing up from a chair with arms (e.g., wheelchair, bedside commode, etc,.)?: None Help needed moving to and from a bed to chair (including a wheelchair)?: A Little Help needed walking in hospital room?: A Little Help needed climbing 3-5 steps with a railing? : A Little 6 Click Score: 21    End of Session Equipment Utilized During Treatment: Gait belt Activity Tolerance: Patient tolerated treatment well Patient left: in chair;with call bell/phone within reach;with chair alarm set Nurse Communication: Mobility status PT Visit Diagnosis: Unsteadiness on feet (R26.81);Difficulty in walking, not elsewhere classified (R26.2)    Time: 1100-1118 PT Time Calculation (min) (ACUTE ONLY): 18 min   Charges:   PT Evaluation $PT Eval Moderate Complexity: 1 Mod         Ellamae Sia, [place], DPT Acute Rehabilitation Services Pager [phone] Office [phone]  Willy Eddy 12/09/2017, 1:38 PM

## 2017-12-09 NOTE — Progress Notes (Signed)
[facility] PROGRESS NOTE  [name] [facility]:[id number] DOB: [birth year] DOA: 12/08/2017 PCP: Denita Lung, MD  Assessment/Plan: Alcohol abuse with withdrawals: Acute. Improving this am. She has been on scheduled po ativan. CIWA score 3. Eating breakfast this am.. Requesting [place] consult for addiction and depression -change CIWA to prn ativan -monitor closely [facility] consult requested -mobilize  Chest pain:  Resolved this am. Patient reported having substernal chest pain that felt like pressure. Troponin negative x3. Chest x-ray showed no acute abnormalities. Patient had been given 324 mg of aspirin in route along with nitroglycerin without complete relief of symptoms. ekg with NSR. -monitor -supportive therapy -gi cocktail   Hypertension. Hx of same. Home meds include propanolol. Improved control. Has not required prn anti-hypertensive -continue propanolol -prn hydralazine -monitor closely as transition from scheduled ativan to prn  Leukocytosis: Resolved this am. Initial WBC elevated 11.6. Afebrile, non-toxic appearing. No signs of infectious process.  -Continue to monitor  Macrocytic anemia: Chronic. Patient presents with hemoglobin 11.9 with elevated MCV. B12 within limits of normal. Folate pending  History of SVT -Continue propranolol  Transaminitis: Acute on chronic. AST noted to be 75 otherwise LFT within limits of normal.   GERD 1. -Continue Protonix  Code Status: full Family Communication: none present Disposition Plan: home tomorrow   Consultants:  Behavioral health  Procedures:  none  Antibiotics:  none  HPI/Subjective: Patient sitting up in bed eating breakfast. Reports feeling "better". States "still have tremors but they are better". Denies pain/nausea  Terri Ayala a 62 y.o.femalewith medical history significant ofalcohol abuse, SVT, and depression;who presented to ed on 10/11 with complaints of chest pain and alcohol  withdrawals. She complained of substernal chest pressure symptoms starting the day prior to presentation. Reported having associated symptoms of nausea, dry heaves, diaphoresis, and difficulty breathing. She admited to drinking 1-1.5bottles of wine per day on average, and reported her last drink sometime day prior to presentation. She felt as though she was withdrawing since that time. Denied any previous history of withdrawal seizures, fever, or loss of consciousness  Objective: Vitals:   12/09/17 1226 12/09/17 1308  BP: 138/84 (!) 142/89  Pulse: 79 83  Resp:  20  Temp:  98.1 F (36.7 C)  SpO2:  100%    Intake/Output Summary (Last 24 hours) at 12/09/2017 1319 Last data filed at 12/09/2017 0945 Gross per 24 hour  Intake 720 ml  Output 350 ml  Net 370 ml   Filed Weights   12/08/17 0642 12/09/17 0308  Weight: 63.6 kg 63.3 kg    Exam:   General:  Well nourished calm in no acute distress  Cardiovascular: rrr no mgr no LE edema   Respiratory: normal effort BS slightly coarse no wheeze  Abdomen: soft +BS non-distended no guarding or rebounding  Musculoskeletal: joints without swelling/erythema   Data Reviewed: Basic Metabolic Panel: Recent Labs  Lab 12/08/17 0127 12/09/17 0515  NA 139 138  K 4.0 3.9  CL 107 103  CO2 23 25  GLUCOSE 138* 81  BUN 9 9  CREATININE 0.76 0.80  CALCIUM 8.4* 8.5*   Liver Function Tests: Recent Labs  Lab 12/08/17 0127  AST 75*  ALT 17  ALKPHOS 74  BILITOT 0.7  PROT 6.3*  ALBUMIN 3.4*   Recent Labs  Lab 12/08/17 0132  LIPASE 39   No results for input(s): AMMONIA in the last 168 hours. CBC: Recent Labs  Lab 12/08/17 0127 12/09/17 0515  WBC 11.6* 8.1  HGB 11.9* 12.2  HCT 36.8 36.9  MCV 102.8* 103.1*  PLT 173 161   Cardiac Enzymes: Recent Labs  Lab 12/08/17 0832 12/08/17 1423  TROPONINI <0.03 <0.03   BNP (last 3 results) No results for input(s): BNP in the last 8760 hours.  ProBNP (last 3 results) No  results for input(s): PROBNP in the last 8760 hours.  CBG: Recent Labs  Lab 12/08/17 0121  GLUCAP 127*    No results found for this or any previous visit (from the past 240 hour(s)).   Studies: Dg Chest Port 1 View  Result Date: 12/08/2017 CLINICAL DATA:  Chest pain and alcohol withdrawal. EXAM: PORTABLE CHEST 1 VIEW COMPARISON:  11/06/2017 FINDINGS: The heart size and mediastinal contours are within normal limits. Both lungs are clear. The visualized skeletal structures are unremarkable. IMPRESSION: No active disease. Electronically Signed   By: Ulyses Jarred M.D.   On: 12/08/2017 04:00    Scheduled Meds: . enoxaparin (LOVENOX) injection  40 mg Subcutaneous Daily  . pantoprazole  40 mg Oral Q1200  . propranolol  20 mg Oral BID  . thiamine  100 mg Oral Daily   Or  . thiamine  100 mg Intravenous Daily   Continuous Infusions:  Principal Problem:   Alcohol withdrawal ([facility]) Active Problems:   Chest pain   Chronic cholecystitis   Leukocytosis   Essential hypertension   Hearing loss   SVT (supraventricular tachycardia) ([place])    Time spent: 45 minutes    [facility]  If 7PM-7AM, please contact night-coverage at [email or web address], password [email or web address] 12/09/2017, 1:19 PM  LOS: 0 days

## 2017-12-09 NOTE — Progress Notes (Signed)
Pt is asking for something for anxiety at home, and seeing a outpatient behavioral specialist [facility].[name] Kitchen) and concerned about  Pt has had multiple visits to hospital for alcohol withdrawals. PT has walked her with gelt belt and close contact. Recommend Psycho consult. Per patient request.

## 2017-12-09 NOTE — Plan of Care (Signed)
  Problem: Education: Goal: Knowledge of General Education information will improve Description Including pain rating scale, medication(s)/side effects and non-pharmacologic comfort measures Outcome: Progressing   Problem: Activity: Goal: Risk for activity intolerance will decrease Outcome: Progressing   Problem: Nutrition: Goal: Adequate nutrition will be maintained Outcome: Progressing   Problem: Coping: Goal: Level of anxiety will decrease Outcome: Progressing   Problem: Safety: Goal: Ability to remain free from injury will improve Outcome: Progressing   Problem: Coping: Goal: Ability to adjust to condition or change in health will improve Outcome: Progressing   Problem: Medication: Goal: Risk for medication side effects will decrease Outcome: Progressing  Patient's concerns were addressed regarding psych consult. Patient ambulated 120 ft earlier this morning and tolerated fairly.  Fluids have been given to patient with medications and upon request. Patient is currently in bed with eyes closed.

## 2017-12-09 NOTE — Progress Notes (Signed)
Received report from [place], patient is in bed with no complains. Will continue to monitor.

## 2017-12-10 DIAGNOSIS — R079 Chest pain, unspecified: Secondary | ICD-10-CM | POA: Diagnosis not present

## 2017-12-10 DIAGNOSIS — I10 Essential (primary) hypertension: Secondary | ICD-10-CM | POA: Diagnosis not present

## 2017-12-10 DIAGNOSIS — F10230 Alcohol dependence with withdrawal, uncomplicated: Secondary | ICD-10-CM

## 2017-12-10 MED ORDER — GABAPENTIN 300 MG PO CAPS: 300.0000 mg | ORAL_CAPSULE | Freq: Two times a day (BID) | ORAL | 1 refills | Status: DC

## 2017-12-10 MED ORDER — CHLORDIAZEPOXIDE HCL 25 MG PO CAPS: ORAL_CAPSULE | ORAL | 0 refills | Status: DC

## 2017-12-10 MED ORDER — GABAPENTIN 300 MG PO CAPS
300.0000 mg | ORAL_CAPSULE | Freq: Two times a day (BID) | ORAL | Status: DC
  Administered 2017-12-10: 300 mg via ORAL
  Filled 2017-12-10: qty 1

## 2017-12-10 NOTE — Progress Notes (Addendum)
RN saw psychiatrist note adding Gabapentin, it was not in her [facility], paged Santiago Glad, NP to send this medicine to pt's pharmacy, pt is notified and give her first dose before she left. Santiago Glad NP redid the AVS and send the medicine to pharmacy, New AVS printed to the patient and described to her   Pt left the unit at 4.23pm in a stable condition without any complain of pain.  Palma Holter, RN

## 2017-12-10 NOTE — Discharge Summary (Addendum)
Physician Discharge Summary  Terri Ayala WUJ:[id number] DOB: [birth year] DOA: 12/08/2017  PCP: Denita Lung, MD  Admit date: 12/08/2017 Discharge date: 12/10/2017  Time spent: 55 minutes  Recommendations for Outpatient Follow-up:  1. Follow up with PCP 1-2 weeks for evaluation of withdrawal symptoms 2. Follow up with [facility] for etoh abuse treatment and evaluation of withdrawal symptoms 3. No ETOH consumption   Discharge Diagnoses:  Principal Problem:   Alcohol withdrawal ([place]) Active Problems:   Chest pain   Chronic cholecystitis   Leukocytosis   Essential hypertension   Hearing loss   SVT (supraventricular tachycardia) ([place])   Discharge Condition: stable  Diet recommendation: heart healthy  Filed Weights   12/08/17 0642 12/09/17 0308 12/10/17 0440  Weight: 63.6 kg 63.3 kg 62.6 kg    History of present illness:  Terri Ayala a 62 y.o.femalewith medical history significant ofalcohol abuse, SVT, and depression;who presented to ed on 10/11 with complaints of chest pain and alcohol withdrawals. She complainedof substernal chest pressure symptoms starting the day prior to presentation.Reportedhaving associated symptoms of nausea, dry heaves, diaphoresis, and difficulty breathing. She admitedto drinking 1-1.5bottles of wine per day on average, and reportedher last drink sometime day prior to presentation.She feltas though she was withdrawing since that time. Deniedany previous history of withdrawal seizures, fever, or loss of consciousness  Hospital Course:  Alcohol abuse with withdrawals: Acute. Initially started on scheduled po ativan. Initial CIWA score 18.  CIWA score tended down to 3. Transitioned to prn ativan. Evaluated by psychiatry who recommend gabapenton and referral to primary psychiatrist for management. Tremors improved. Gait steadiness improved at discharge. Discharged with a 6 day Librium taper. Follow up with [facility]  Chest pain:  Resolved  and ruled out. Patient reportedhaving substernal chest pain that feltlike pressure. Troponin negativex3. Chest x-ray showed no acute abnormalities. Patient had been given 324 mg of aspirin in route along with nitroglycerin without complete relief of symptoms. ekg with NSR.  Hypertension. controlled  Leukocytosis: Resolved.Afebrile, non-toxic appearing. No signs of infectious process.   Macrocytic anemia: Chronic. Patient presented with hemoglobin 11.9 with elevated MCV.B12 within limits of normal. Folate pending  History of SVT -Continue propranolol  Transaminitis: Acute on chronic. AST noted to be 75 otherwise LFT within limits of normal.    Procedures:    Consultations:  none  Discharge Exam: Vitals:   12/10/17 1424 12/10/17 1500  BP: (!) 145/70 140/70  Pulse: 76 74  Resp: 18 18  Temp: 98 F (36.7 C) 98 F (36.7 C)  SpO2: 100% 100%    General: alert and oriented watching tv eating breakfast Cardiovascular: rrr no mgr no LE edema Respiratory: normal effort bs clear bilaterally no wheeze   Discharge Instructions   Discharge Instructions    Ambulatory referral to Psychiatry   Complete by:  As directed    Anxiety/depression   Diet - low sodium heart healthy   Complete by:  As directed    Discharge instructions   Complete by:  As directed    Stop drinking   Increase activity slowly   Complete by:  As directed      Allergies as of 12/10/2017      Reactions   Doxycycline Swelling   Mouth swelling and sores   Codeine Itching   Tetracycline Hcl Swelling   Mouth swelling and sores      Medication List    STOP taking these medications   meloxicam 15 MG tablet Commonly known as:  MOBIC  TAKE these medications   alendronate 70 MG tablet Commonly known as:  FOSAMAX Take 70 mg by mouth once a week. Take with a full glass of water on an empty stomach.   busPIRone 15 MG tablet Commonly known as:  BUSPAR Take 15 mg by mouth 3  (three) times daily.   chlordiazePOXIDE 25 MG capsule Commonly known as:  LIBRIUM 25 mg BID x 2 days then 12.5 mg BID x 2 days then 12.5mg  daily for 2 days and stop   DULoxetine 60 MG capsule Commonly known as:  CYMBALTA TAKE 1 CAPSULE (60 MG TOTAL) BY MOUTH DAILY. What changed:  See the new instructions.   gabapentin 300 MG capsule Commonly known as:  NEURONTIN Take 1 capsule (300 mg total) by mouth 2 (two) times daily.   hydrOXYzine 10 MG tablet Commonly known as:  ATARAX/VISTARIL Take 10 mg by mouth 2 (two) times daily.   norethindrone-ethinyl estradiol 1-5 MG-MCG Tabs tablet Commonly known as:  FEMHRT 1/5 Take 1 tablet by mouth daily. For Osteoporosis   ondansetron 4 MG disintegrating tablet Commonly known as:  ZOFRAN-ODT Take 1 tablet (4 mg total) by mouth every 8 (eight) hours as needed for nausea or vomiting.   pantoprazole 40 MG tablet Commonly known as:  PROTONIX Take 1 tablet (40 mg total) by mouth daily at 12 noon.   propranolol 10 MG tablet Commonly known as:  INDERAL Take 20 mg by mouth 2 (two) times daily.   SUMAtriptan 100 MG tablet Commonly known as:  IMITREX Take 1 tablet at the first sign of migraine.  May repeat in 2 hours. What changed:    how much to take  how to take this  when to take this  reasons to take this  additional instructions   thiamine 100 MG tablet Take 1 tablet (100 mg total) by mouth daily.   traZODone 50 MG tablet Commonly known as:  DESYREL Take 50 mg by mouth at bedtime.      Allergies  Allergen Reactions  . Doxycycline Swelling    Mouth swelling and sores  . Codeine Itching  . Tetracycline Hcl Swelling    Mouth swelling and sores      The results of significant diagnostics from this hospitalization (including imaging, microbiology, ancillary and laboratory) are listed below for reference.    Significant Diagnostic Studies: Dg Chest Port 1 View  Result Date: 12/08/2017 CLINICAL DATA:  Chest pain and  alcohol withdrawal. EXAM: PORTABLE CHEST 1 VIEW COMPARISON:  11/06/2017 FINDINGS: The heart size and mediastinal contours are within normal limits. Both lungs are clear. The visualized skeletal structures are unremarkable. IMPRESSION: No active disease. Electronically Signed   By: Ulyses Jarred M.D.   On: 12/08/2017 04:00    Microbiology: No results found for this or any previous visit (from the past 240 hour(s)).   Labs: Basic Metabolic Panel: Recent Labs  Lab 12/08/17 0127 12/09/17 0515  NA 139 138  K 4.0 3.9  CL 107 103  CO2 23 25  GLUCOSE 138* 81  BUN 9 9  CREATININE 0.76 0.80  CALCIUM 8.4* 8.5*   Liver Function Tests: Recent Labs  Lab 12/08/17 0127  AST 75*  ALT 17  ALKPHOS 74  BILITOT 0.7  PROT 6.3*  ALBUMIN 3.4*   Recent Labs  Lab 12/08/17 0132  LIPASE 39   No results for input(s): AMMONIA in the last 168 hours. CBC: Recent Labs  Lab 12/08/17 0127 12/09/17 0515  WBC 11.6* 8.1  HGB 11.9*  12.2  HCT 36.8 36.9  MCV 102.8* 103.1*  PLT 173 161   Cardiac Enzymes: Recent Labs  Lab 12/08/17 0832 12/08/17 1423  TROPONINI <0.03 <0.03   BNP: BNP (last 3 results) No results for input(s): BNP in the last 8760 hours.  ProBNP (last 3 results) No results for input(s): PROBNP in the last 8760 hours.  CBG: Recent Labs  Lab 12/08/17 0121  GLUCAP 127*       Signed:  Radene Gunning MD.  [facility] 12/10/2017, 3:45 PM

## 2017-12-10 NOTE — Progress Notes (Addendum)
RN came to the room with discharge paper. Patient was excited to release. She asked RN wanted to take close out of the drawer, RN was helping her, when pt got up, she said "my legs feeling weak and she sat down on the floor" while RN presence on the room, in her side. Patient's back touched on the soft part of the recliner while sitting in a floor, vitals stable, paged MD  Palma Holter, RN

## 2017-12-10 NOTE — Consult Note (Signed)
[facility] Psychiatry Consult   Reason for Consult: alcohol and depression Referring Physician:   Dr. Eliseo Squires Patient Identification: Terri Ayala MRN:  [id number] Principal Diagnosis: Alcohol withdrawal [facility]) Diagnosis:   Patient Active Problem List   Diagnosis Date Noted  . Chest pain [R07.9] 12/08/2017  . Leukocytosis [D72.829] 12/08/2017  . Essential hypertension [I10] 12/08/2017  . Alcohol withdrawal ([place]) [F10.239] 07/21/2017  . Major depressive disorder, recurrent episode, moderate (HCC) [F33.1]   . Alcohol use disorder, severe, dependence ([place]) [F10.20] 04/03/2014  . Lumbosacral radiculopathy at L5 [M54.17] 05/20/2013  . Nonspecific elevation of levels of transaminase or lactic acid dehydrogenase (LDH) [R74.0] 03/16/2013  . Hepatic steatosis [K76.0] 03/16/2013  . Chronic cholecystitis [K81.1] 03/14/2013  . Cervical arthritis [M47.812] 10/12/2012  . Palpitations [R00.2] 11/29/2010  . SVT (supraventricular tachycardia) ([place]) [I47.1] 11/29/2010  . Mitral valve prolapse [I34.1] 11/29/2010  . Decreased libido [R68.82] 11/15/2010  . GOITER, MULTINODULAR [E04.2] 08/17/2009  . ANXIETY [F41.1] 08/17/2009  . Migraine headache [G43.909] 08/17/2009  . Hearing loss [H91.90] 08/17/2009  . OSTEOPOROSIS [M81.0] 08/17/2009  . ASYMPTOMATIC POSTMENOPAUSAL STATUS [Z78.0] 08/17/2009  . DYSPHAGIA [R13.19] 06/19/2007    Total Time spent with patient: 45 minutes  Subjective:   Terri Ayala is a 62 y.o. female patient admitted with chest and alcohol withdrawal.  HPI:   Patient who reports history of Depression and alcohol use disorder-severe. Patient reports that she came to the hospital due to withdrawal symptoms from alcohol characterized by anxiety, bilateral hand tremors, nervousness, inability to sleep and poor appetite. She also reports chest pain associated with shortness of breath, dry heaves, diaphoresis, and nausea. Patient states that she has been drinking for over 30 years, she  average about 1-1.5 bottles of wine per day. She reports that living alone does not help as she has tried multiple times to stop drinking without success. She denies any previous history of withdrawal seizures or loss of consciousness. Today, patient is calm, cooperative and denies psychosis, delusions but still verbalizes some anxiety and apprehensive about her inability to stop drinking. Patient denies suicidal or homicidal ideation, intent or plan. She is requesting referral to outpatient alcohol rehabilitation.  Past Psychiatric History: as above  Risk to Self:  denies Risk to Others:  denies Prior Inpatient Therapy:   Prior Outpatient Therapy:    Past Medical History:  Past Medical History:  Diagnosis Date  . Alcohol withdrawal ([place]) 11/2017  . Alcoholism ([place])   . Anxiety   . ASYMPTOMATIC POSTMENOPAUSAL STATUS 08/17/2009  . DYSPHAGIA 06/19/2007  . Fatty liver, alcoholic   . GOITER, MULTINODULAR 08/17/2009  . Hyperlipidemia   . Low back pain   . Migraines   . Mitral valve prolapse   . OSTEOPOROSIS 08/17/2009  . PONV (postoperative nausea and vomiting)   . Supraventricular tachycardia [facility])     Past Surgical History:  Procedure Laterality Date  . BREAST BIOPSY Right   . CERVIX SURGERY    . CHOLECYSTECTOMY N/A 03/14/2013   Procedure: LAPAROSCOPIC CHOLECYSTECTOMY WITH ATTEMPTED INTRAOPERATIVE CHOLANGIOGRAM;  Surgeon: Harl Bowie, MD;  Location: [facility];  Service: General;  Laterality: N/A;  . laporoscopic abdominal surgery     Family History:  Family History  Problem Relation Age of Onset  . Colon cancer Father        Deceased, 43  . Osteoporosis Mother        Living, 62  . Healthy Brother   . Thyroid disease Neg Hx   . Goiter Neg Hx  Family Psychiatric  History:  Social History:  Social History   Substance and Sexual Activity  Alcohol Use Yes   Comment: bottle of wine daily     Social History   Substance and Sexual Activity  Drug Use No    Social  History   Socioeconomic History  . Marital status: Single    Spouse name: Not on file  . Number of children: Not on file  . Years of education: Not on file  . Highest education level: Not on file  Occupational History  . Occupation: Recruitment consultant: [employer]  . Financial resource strain: Not on file  . Food insecurity:    Worry: Not on file    Inability: Not on file  . Transportation needs:    Medical: Not on file    Non-medical: Not on file  Tobacco Use  . Smoking status: Never Smoker  . Smokeless tobacco: Never Used  Substance and Sexual Activity  . Alcohol use: Yes    Comment: bottle of wine daily  . Drug use: No  . Sexual activity: Never  Lifestyle  . Physical activity:    Days per week: Not on file    Minutes per session: Not on file  . Stress: Not on file  Relationships  . Social connections:    Talks on phone: Not on file    Gets together: Not on file    Attends religious service: Not on file    Active member of club or organization: Not on file    Attends meetings of clubs or organizations: Not on file    Relationship status: Not on file  Other Topics Concern  . Not on file  Social History Narrative   Lives alone.  She has a BS biology.   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   Additional Social History:    Allergies:   Allergies  Allergen Reactions  . Doxycycline Swelling    Mouth swelling and sores  . Codeine Itching  . Tetracycline Hcl Swelling    Mouth swelling and sores    Labs:  Results for orders placed or performed during the hospital encounter of 12/08/17 (from the past 48 hour(s))  CBC     Status: Abnormal   Collection Time: 12/09/17  5:15 AM  Result Value Ref Range   WBC 8.1 4.0 - 10.5 K/uL   RBC 3.58 (L) 3.87 - 5.11 MIL/uL   Hemoglobin 12.2 12.0 - 15.0 g/dL   HCT 36.9 36.0 - 46.0 %   MCV 103.1 (H) 80.0 - 100.0 fL   MCH 34.1 (H) 26.0 - 34.0 pg   MCHC 33.1 30.0 -  36.0 g/dL   RDW 12.4 11.5 - 15.5 %   Platelets 161 150 - 400 K/uL   nRBC 0.0 0.0 - 0.2 %    Comment: Performed at [facility], [place] [address] [place], [place] [address]  Basic metabolic panel     Status: Abnormal   Collection Time: 12/09/17  5:15 AM  Result Value Ref Range   Sodium 138 135 - 145 mmol/L   Potassium 3.9 3.5 - 5.1 mmol/L   Chloride 103 98 - 111 mmol/L   CO2 25 22 - 32 mmol/L   Glucose, Bld 81 70 - 99 mg/dL   BUN 9 8 - 23 mg/dL   Creatinine, Ser 0.80 0.44 - 1.00 mg/dL   Calcium 8.5 (L) 8.9 - 10.3  mg/dL   GFR calc non Af Amer >60 >60 mL/min   GFR calc Af Amer >60 >60 mL/min    Comment: (NOTE) The eGFR has been calculated using the CKD EPI equation. This calculation has not been validated in all clinical situations. eGFR's persistently <60 mL/min signify possible Chronic Kidney Disease.    Anion gap 10 5 - 15    Comment: Performed at [facility] [address], [place], [place] [address]    Current Facility-Administered Medications  Medication Dose Route Frequency Provider Last Rate Last Dose  . acetaminophen (TYLENOL) tablet 650 mg  650 mg Oral Q4H PRN Smith, Rondell A, MD      . enoxaparin (LOVENOX) injection 40 mg  40 mg Subcutaneous Daily Tamala Julian, Rondell A, MD   40 mg at 12/10/17 0826  . gi cocktail (Maalox,Lidocaine,Donnatal)  30 mL Oral QID PRN Fuller Plan A, MD      . hydrALAZINE (APRESOLINE) injection 5 mg  5 mg Intravenous Q4H PRN Black, Lezlie Octave, NP      . nitroGLYCERIN (NITROSTAT) SL tablet 0.4 mg  0.4 mg Sublingual Q5 min PRN Fuller Plan A, MD      . ondansetron (ZOFRAN) injection 4 mg  4 mg Intravenous Q6H PRN Fuller Plan A, MD   4 mg at 12/08/17 1221  . pantoprazole (PROTONIX) EC tablet 40 mg  40 mg Oral Q1200 Fuller Plan A, MD   40 mg at 12/10/17 0825  . propranolol (INDERAL) tablet 20 mg  20 mg Oral BID Fuller Plan A, MD   20 mg at 12/10/17 0826  . thiamine (VITAMIN B-1) tablet 100 mg  100 mg Oral Daily Tamala Julian, Rondell A, MD    100 mg at 12/10/17 0825   Or  . thiamine (B-1) injection 100 mg  100 mg Intravenous Daily Fuller Plan A, MD   100 mg at 12/08/17 0149    Musculoskeletal: Strength & Muscle Tone: within normal limits Gait & Station: normal Patient leans: N/A  Psychiatric Specialty Exam: Physical Exam  Psychiatric: Her speech is normal. Judgment and thought content normal. Her mood appears anxious. She is slowed. Cognition and memory are normal.    Review of Systems  Constitutional: Negative.   HENT: Negative.   Eyes: Negative.   Respiratory: Negative.   Cardiovascular: Negative.   Gastrointestinal: Negative.   Genitourinary: Negative.   Musculoskeletal: Negative.   Skin: Negative.   Neurological: Positive for tremors.  Endo/Heme/Allergies: Negative.   Psychiatric/Behavioral: Positive for substance abuse. The patient is nervous/anxious.     Blood pressure (!) 145/70, pulse 76, temperature 98 F (36.7 C), temperature source Oral, resp. rate 18, height _0  (1.575 m), weight 62.6 kg, SpO2 100 %.Body mass index is 25.22 kg/m.  General Appearance: Casual  Eye Contact:  Good  Speech:  Clear and Coherent  Volume:  Normal  Mood:  Anxious  Affect:  Congruent  Thought Process:  Coherent and Linear  Orientation:  Full (Time, Place, and Person)  Thought Content:  Logical  Suicidal Thoughts:  No  Homicidal Thoughts:  No  Memory:  Immediate;   Good Recent;   Good Remote;   Good  Judgement:  Fair  Insight:  Fair  Psychomotor Activity:  Psychomotor Retardation  Concentration:  Concentration: Fair and Attention Span: Fair  Recall:  [place] of Knowledge:  Good  Language:  Fair  Akathisia:  No  Handed:  Right  AIMS (if indicated):     Assets:  Communication Skills Desire  for Improvement  ADL's:  Intact  Cognition:  WNL  Sleep:   poor     Treatment Plan Summary: 62 y.o. female with long history of alcohol use disorder severe with multiple attempts in the past  to stop drinking but  has been unsuccessful. Patient currently requesting to be referred to alcohol rehab program when she is stable.  Recommendations: -Consider Gabapentin 300 mg twice daily for anxiety/mood/alcohol abuse/withdrawal. -Consider social worker consult to facilitate referring patient to outpatient alcohol rehab program upon when patient is medically stable. -Refer patient to her psychiatrist when discharge for medication management of depression. -Psychiatric service is signing out. Re-consult psych as needed.  Disposition: No evidence of imminent risk to self or others at present.   Patient does not meet criteria for psychiatric inpatient admission. Supportive therapy provided about ongoing stressors.  Corena Pilgrim, MD 12/10/2017 2:56 PM

## 2017-12-10 NOTE — Progress Notes (Addendum)
Santiago Glad, NP in the bed side, assessed the patient said that "she does not need any X-ray or any sort of test this time, she does not have any tingling and numbness and complain of pain this point and she is good to be discharged". RN was concerned regarding her living situation, that she lives alone and does not seems like she is taking good care of herself by looking her hygiene and home clothes. After MD said " there is no reason to hold her up" RN went through her discharge paper and she showed understanding to it. Knew that medicine need to be picked up from pharmacy.Tele-monitor off, IV out, pt is waiting for her ride from [place] this time. Pt walked again in a room and stable and balanced on her foot and willing to be discharged. She said "she is fine wants to be discharged" even RN asked couple of time, "are you sure that you are physically stable to be discharged, pt said I am perfectly fine and willing to go home"  [place], [place]

## 2017-12-10 NOTE — Progress Notes (Signed)
Pt  ambulated in a hallway without any distress. SPO2 maintained at 98 in RA.  Palma Holter RN

## 2017-12-19 ENCOUNTER — Ambulatory Visit (HOSPITAL_COMMUNITY)
Admission: EM | Admit: 2017-12-19 | Discharge: 2017-12-19 | Disposition: A | Discharge status: Home / Self Care | Payer: Federal, State, Local not specified - PPO | Attending: Family Medicine | Admitting: Family Medicine

## 2017-12-19 ENCOUNTER — Ambulatory Visit (INDEPENDENT_AMBULATORY_CARE_PROVIDER_SITE_OTHER): Payer: Federal, State, Local not specified - PPO

## 2017-12-19 ENCOUNTER — Encounter (HOSPITAL_COMMUNITY): Payer: Self-pay | Admitting: Emergency Medicine

## 2017-12-19 DIAGNOSIS — S2241XA Multiple fractures of ribs, right side, initial encounter for closed fracture: Secondary | ICD-10-CM

## 2017-12-19 MED ORDER — LIDOCAINE 5 % EX PTCH: 1.0000 | MEDICATED_PATCH | CUTANEOUS | 0 refills | Status: DC

## 2017-12-19 MED ORDER — DICLOFENAC SODIUM 1 % TD GEL: 2.0000 g | Freq: Four times a day (QID) | TRANSDERMAL | 0 refills | Status: DC

## 2017-12-19 NOTE — ED Provider Notes (Signed)
[place]    CSN: [id number] Arrival date & time: 12/19/17  1533     History   Chief Complaint Chief Complaint  Patient presents with  . Rib Pain    HPI Terri Ayala is a 62 y.o. female.   62 year old female comes in for 1 week history of right rib pain after fall. States fall caused her to hit the right lateral/frontal rib. Denies head injury, loss of consciousness. Denies chest pain, shortness of breath. She states continued pain especially during deep breaths. Has been taking gabapentin without relief.      Past Medical History:  Diagnosis Date  . Alcohol withdrawal ([place]) 11/2017  . Alcoholism ([place])   . Anxiety   . ASYMPTOMATIC POSTMENOPAUSAL STATUS 08/17/2009  . DYSPHAGIA 06/19/2007  . Fatty liver, alcoholic   . GOITER, MULTINODULAR 08/17/2009  . Hyperlipidemia   . Low back pain   . Migraines   . Mitral valve prolapse   . OSTEOPOROSIS 08/17/2009  . PONV (postoperative nausea and vomiting)   . Supraventricular tachycardia [facility])     Patient Active Problem List   Diagnosis Date Noted  . Chest pain 12/08/2017  . Leukocytosis 12/08/2017  . Essential hypertension 12/08/2017  . Alcohol withdrawal ([place]) 07/21/2017  . Major depressive disorder, recurrent episode, moderate ([place])   . Alcohol use disorder, severe, dependence ([place]) 04/03/2014  . Lumbosacral radiculopathy at L5 05/20/2013  . Nonspecific elevation of levels of transaminase or lactic acid dehydrogenase (LDH) 03/16/2013  . Hepatic steatosis 03/16/2013  . Chronic cholecystitis 03/14/2013  . Cervical arthritis 10/12/2012  . Palpitations 11/29/2010  . SVT (supraventricular tachycardia) ([place]) 11/29/2010  . Mitral valve prolapse 11/29/2010  . Decreased libido 11/15/2010  . GOITER, MULTINODULAR 08/17/2009  . ANXIETY 08/17/2009  . Migraine headache 08/17/2009  . Hearing loss 08/17/2009  . OSTEOPOROSIS 08/17/2009  . ASYMPTOMATIC POSTMENOPAUSAL STATUS 08/17/2009  . DYSPHAGIA 06/19/2007    Past  Surgical History:  Procedure Laterality Date  . BREAST BIOPSY Right   . CERVIX SURGERY    . CHOLECYSTECTOMY N/A 03/14/2013   Procedure: LAPAROSCOPIC CHOLECYSTECTOMY WITH ATTEMPTED INTRAOPERATIVE CHOLANGIOGRAM;  Surgeon: Harl Bowie, MD;  Location: [place];  Service: General;  Laterality: N/A;  . laporoscopic abdominal surgery      OB History   None      Home Medications    Prior to Admission medications   Medication Sig Start Date End Date Taking? Authorizing Provider  alendronate (FOSAMAX) 70 MG tablet Take 70 mg by mouth once a week. Take with a full glass of water on an empty stomach.    [provider]  busPIRone (BUSPAR) 15 MG tablet Take 15 mg by mouth 3 (three) times daily.    [provider]  chlordiazePOXIDE (LIBRIUM) 25 MG capsule 25 mg BID x 2 days then 12.5 mg BID x 2 days then 12.5mg  daily for 2 days and stop 12/10/17   Eulogio Bear U, DO  diclofenac sodium (VOLTAREN) 1 % GEL Apply 2 g topically 4 (four) times daily. 12/19/17   Tasia Catchings, Amy V, PA-C  DULoxetine (CYMBALTA) 60 MG capsule TAKE 1 CAPSULE (60 MG TOTAL) BY MOUTH DAILY. Patient taking differently: Take 60 mg by mouth daily.  10/23/14   Denita Lung, MD  gabapentin (NEURONTIN) 300 MG capsule Take 1 capsule (300 mg total) by mouth 2 (two) times daily. 12/10/17   Black, Lezlie Octave, NP  hydrOXYzine (ATARAX/VISTARIL) 10 MG tablet Take 10 mg by mouth 2 (two) times daily.  07/10/17   [provider]  lidocaine (LIDODERM) 5 % Place 1 patch onto the skin daily. Remove & Discard patch within 12 hours or as directed by MD 12/19/17   Ok Edwards, PA-C  norethindrone-ethinyl estradiol (FEMHRT 1/5) 1-5 MG-MCG TABS Take 1 tablet by mouth daily. For Osteoporosis 04/07/14   Lindell Spar I, NP  ondansetron (ZOFRAN ODT) 4 MG disintegrating tablet Take 1 tablet (4 mg total) by mouth every 8 (eight) hours as needed for nausea or vomiting. 09/01/17   Henson, Vickie L, NP-C  pantoprazole (PROTONIX) 40 MG tablet Take 1  tablet (40 mg total) by mouth daily at 12 noon. 11/09/17   Ghimire, Henreitta Leber, MD  propranolol (INDERAL) 10 MG tablet Take 20 mg by mouth 2 (two) times daily.     [provider]  SUMAtriptan (IMITREX) 100 MG tablet Take 1 tablet at the first sign of migraine.  May repeat in 2 hours. Patient taking differently: Take 100 mg by mouth every 2 (two) hours as needed for migraine.  07/31/17   Denita Lung, MD  thiamine 100 MG tablet Take 1 tablet (100 mg total) by mouth daily. 11/09/17   Ghimire, Henreitta Leber, MD  traZODone (DESYREL) 50 MG tablet Take 50 mg by mouth at bedtime.     [provider]    Family History Family History  Problem Relation Age of Onset  . Colon cancer Father        Deceased, 81  . Osteoporosis Mother        Living, 46  . Healthy Brother   . Thyroid disease Neg Hx   . Goiter Neg Hx     Social History Social History   Tobacco Use  . Smoking status: Never Smoker  . Smokeless tobacco: Never Used  Substance Use Topics  . Alcohol use: Yes    Comment: bottle of wine daily  . Drug use: No     Allergies   Doxycycline; Codeine; and Tetracycline hcl   Review of Systems Review of Systems  Reason unable to perform ROS: See HPI as above.     Physical Exam Triage Vital Signs ED Triage Vitals  Enc Vitals Group     BP 12/19/17 1541 100/68     Pulse Rate 12/19/17 1541 88     Resp 12/19/17 1541 16     Temp 12/19/17 1541 98 F (36.7 C)     Temp src --      SpO2 12/19/17 1541 100 %     Weight --      Height --      Head Circumference --      Peak Flow --      Pain Score 12/19/17 1542 8     Pain Loc --      Pain Edu? --      Excl. in [place]? --    No data found.  Updated Vital Signs BP 100/68   Pulse 88   Temp 98 F (36.7 C)   Resp 16   SpO2 100%   Physical Exam  Constitutional: She is oriented to person, place, and time. She appears well-developed and well-nourished. No distress.  HENT:  Head: Normocephalic and atraumatic.  Eyes:  Pupils are equal, round, and reactive to light. Conjunctivae are normal.  Cardiovascular: Normal rate, regular rhythm and normal heart sounds. Exam reveals no gallop and no friction rub.  No murmur heard. Pulmonary/Chest: Effort normal and breath sounds normal. No accessory muscle usage or stridor.  No respiratory distress. She has no decreased breath sounds. She has no wheezes. She has no rhonchi. She has no rales.  No obvious contusion/swelling seen. Tenderness to palpation right lower posterior and anterior ribs that can be distractible.   Neurological: She is alert and oriented to person, place, and time.  Skin: She is not diaphoretic.    UC Treatments / Results  Labs (all labs ordered are listed, but only abnormal results are displayed) Labs Reviewed - No data to display  EKG None  Radiology Dg Ribs Unilateral W/chest Right  Result Date: 12/19/2017 CLINICAL DATA:  62 y/o F; fall with pain to the posterior and lateral right ribs. EXAM: RIGHT RIBS AND CHEST - 3+ VIEW COMPARISON:  12/08/2017 chest radiograph. FINDINGS: Right 8 and 9 posterolateral minimally displaced acute rib fractures. Clear lungs. Stable cardiac silhouette. No pleural effusion or pneumothorax. Cholecystectomy clips in the right upper quadrant. IMPRESSION: Right 8 and 9 posterolateral minimally displaced acute rib fractures. No pneumothorax. Electronically Signed   By: Kristine Garbe M.D.   On: 12/19/2017 16:20    Procedures Procedures (including critical care time)  Medications Ordered in UC Medications - No data to display  Initial Impression / Assessment and Plan / UC Course  I have reviewed the triage vital signs and the nursing notes.  Pertinent labs & imaging results that were available during my care of the patient were reviewed by me and considered in my medical decision making (see chart for details).    Discussed x-ray results with patient.  No evidence of pneumothorax.  Patient with history  of alcohol abuse causing gastritis, will avoid NSAIDs at this time.  Given fall risk, avoiding narcotics at this time.  Will have patient use Lidoderm and Voltaren gel for pain relief.  Discussed spirometry use to prevent infection.  Patient to follow-up with PCP for further monitoring of rib fractures.  Return precautions given.  Final Clinical Impressions(s) / UC Diagnoses   Final diagnoses:  Closed fracture of multiple ribs of right side, initial encounter    ED Prescriptions    Medication Sig Dispense Auth. Provider   lidocaine (LIDODERM) 5 % Place 1 patch onto the skin daily. Remove & Discard patch within 12 hours or as directed by MD 30 patch Yu, Amy V, PA-C   diclofenac sodium (VOLTAREN) 1 % GEL Apply 2 g topically 4 (four) times daily. 1 Tube Tobin Chad, [place] 12/19/17 1904

## 2017-12-19 NOTE — Discharge Instructions (Signed)
Rib fractured to 8th and 9th rib. Lungs are not punctured. You can use lidoderm patches as directed as well as voltaren gel. Use the spirometer to make sure you are taking good breaths, this can prevent infection. Follow up with your PCP for further monitoring needed. If experiencing sudden shortness of breath, chest pain, difficulty breathing, please go to the emergency department for further evaluation needed.

## 2017-12-19 NOTE — ED Notes (Signed)
PT given an incentive spirometer and taught how to use it.
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
## 2017-12-29 ENCOUNTER — Emergency Department (HOSPITAL_COMMUNITY): Payer: Federal, State, Local not specified - PPO

## 2017-12-29 ENCOUNTER — Encounter (HOSPITAL_COMMUNITY): Payer: Self-pay | Admitting: Emergency Medicine

## 2017-12-29 ENCOUNTER — Other Ambulatory Visit: Payer: Self-pay

## 2017-12-29 ENCOUNTER — Emergency Department (HOSPITAL_COMMUNITY)
Admission: EM | Admit: 2017-12-29 | Discharge: 2017-12-29 | Disposition: A | Discharge status: Home / Self Care | Payer: Federal, State, Local not specified - PPO | Attending: Emergency Medicine | Admitting: Emergency Medicine

## 2017-12-29 DIAGNOSIS — I10 Essential (primary) hypertension: Secondary | ICD-10-CM | POA: Diagnosis not present

## 2017-12-29 DIAGNOSIS — R0789 Other chest pain: Secondary | ICD-10-CM | POA: Diagnosis not present

## 2017-12-29 DIAGNOSIS — S2231XA Fracture of one rib, right side, initial encounter for closed fracture: Secondary | ICD-10-CM | POA: Diagnosis not present

## 2017-12-29 DIAGNOSIS — W19XXXD Unspecified fall, subsequent encounter: Secondary | ICD-10-CM | POA: Insufficient documentation

## 2017-12-29 DIAGNOSIS — S2231XD Fracture of one rib, right side, subsequent encounter for fracture with routine healing: Secondary | ICD-10-CM | POA: Diagnosis not present

## 2017-12-29 DIAGNOSIS — Z79899 Other long term (current) drug therapy: Secondary | ICD-10-CM | POA: Insufficient documentation

## 2017-12-29 MED ORDER — CHLORDIAZEPOXIDE HCL 25 MG PO CAPS: ORAL_CAPSULE | ORAL | 0 refills | Status: DC

## 2017-12-29 NOTE — ED Notes (Signed)
ED Provider at bedside. 

## 2017-12-29 NOTE — ED Provider Notes (Signed)
[facility] EMERGENCY DEPARTMENT Provider Note   CSN: [id number] Arrival date & time: 12/29/17  1334     History   Chief Complaint Chief Complaint  Patient presents with  . Chest Pain    RIB  RIGHT     HPI Terri Ayala is a 62 y.o. female.  62 year old female with prior medical history as detailed below presents for evaluation of continued right rib pain.  Patient reports recent diagnosis of right rib fracture.  She reports that she has been using incentive spirometry as instructed.  She has used lidocaine patch with Voltaren gel with minimal control of symptoms.  She reports continued pain.  She is concerned that she may have additional fractures or other pathology present.  Of note, patient has a long-standing history of alcohol use and abuse.  Her last drink was earlier today.  She continues to drink on a daily basis.  Given her reported alcoholism she has not been prescribed a stronger pain medications for her fracture per chart review.  The history is provided by the patient and medical records.  Illness  This is a recurrent problem. The current episode started more than 1 week ago. The problem occurs daily. The problem has not changed since onset.Associated symptoms include chest pain. Pertinent negatives include no abdominal pain, no headaches and no shortness of breath. Associated symptoms comments: Right rib pain. Nothing aggravates the symptoms. Nothing relieves the symptoms.    Past Medical History:  Diagnosis Date  . Alcohol withdrawal ([place]) 11/2017  . Alcoholism ([place])   . Anxiety   . ASYMPTOMATIC POSTMENOPAUSAL STATUS 08/17/2009  . DYSPHAGIA 06/19/2007  . Fatty liver, alcoholic   . GOITER, MULTINODULAR 08/17/2009  . Hyperlipidemia   . Low back pain   . Migraines   . Mitral valve prolapse   . OSTEOPOROSIS 08/17/2009  . PONV (postoperative nausea and vomiting)   . Supraventricular tachycardia [facility])     Patient Active Problem List   Diagnosis  Date Noted  . Chest pain 12/08/2017  . Leukocytosis 12/08/2017  . Essential hypertension 12/08/2017  . Alcohol withdrawal ([place]) 07/21/2017  . Major depressive disorder, recurrent episode, moderate ([place])   . Alcohol use disorder, severe, dependence ([place]) 04/03/2014  . Lumbosacral radiculopathy at L5 05/20/2013  . Nonspecific elevation of levels of transaminase or lactic acid dehydrogenase (LDH) 03/16/2013  . Hepatic steatosis 03/16/2013  . Chronic cholecystitis 03/14/2013  . Cervical arthritis 10/12/2012  . Palpitations 11/29/2010  . SVT (supraventricular tachycardia) ([place]) 11/29/2010  . Mitral valve prolapse 11/29/2010  . Decreased libido 11/15/2010  . GOITER, MULTINODULAR 08/17/2009  . ANXIETY 08/17/2009  . Migraine headache 08/17/2009  . Hearing loss 08/17/2009  . OSTEOPOROSIS 08/17/2009  . ASYMPTOMATIC POSTMENOPAUSAL STATUS 08/17/2009  . DYSPHAGIA 06/19/2007    Past Surgical History:  Procedure Laterality Date  . BREAST BIOPSY Right   . CERVIX SURGERY    . CHOLECYSTECTOMY N/A 03/14/2013   Procedure: LAPAROSCOPIC CHOLECYSTECTOMY WITH ATTEMPTED INTRAOPERATIVE CHOLANGIOGRAM;  Surgeon: Harl Bowie, MD;  Location: [facility];  Service: General;  Laterality: N/A;  . laporoscopic abdominal surgery       OB History   None      Home Medications    Prior to Admission medications   Medication Sig Start Date End Date Taking? Authorizing Provider  alendronate (FOSAMAX) 70 MG tablet Take 70 mg by mouth once a week. Take with a full glass of water on an empty stomach.    [provider]  busPIRone (  BUSPAR) 15 MG tablet Take 15 mg by mouth 3 (three) times daily.    [provider]  chlordiazePOXIDE (LIBRIUM) 25 MG capsule 25 mg BID x 2 days then 12.5 mg BID x 2 days then 12.5mg  daily for 2 days and stop 12/10/17   Geradine Girt, DO  chlordiazePOXIDE (LIBRIUM) 25 MG capsule 50mg  PO TID x 1D, then 25-50mg  PO BID X 1D, then 25-50mg  PO QD X 1D 12/29/17   Valarie Merino, MD  diclofenac sodium (VOLTAREN) 1 % GEL Apply 2 g topically 4 (four) times daily. 12/19/17   Tasia Catchings, Amy V, PA-C  DULoxetine (CYMBALTA) 60 MG capsule TAKE 1 CAPSULE (60 MG TOTAL) BY MOUTH DAILY. Patient taking differently: Take 60 mg by mouth daily.  10/23/14   Denita Lung, MD  gabapentin (NEURONTIN) 300 MG capsule Take 1 capsule (300 mg total) by mouth 2 (two) times daily. 12/10/17   Black, Lezlie Octave, NP  hydrOXYzine (ATARAX/VISTARIL) 10 MG tablet Take 10 mg by mouth 2 (two) times daily. 07/10/17   [provider]  lidocaine (LIDODERM) 5 % Place 1 patch onto the skin daily. Remove & Discard patch within 12 hours or as directed by MD 12/19/17   Ok Edwards, PA-C  norethindrone-ethinyl estradiol (FEMHRT 1/5) 1-5 MG-MCG TABS Take 1 tablet by mouth daily. For Osteoporosis 04/07/14   Lindell Spar I, NP  ondansetron (ZOFRAN ODT) 4 MG disintegrating tablet Take 1 tablet (4 mg total) by mouth every 8 (eight) hours as needed for nausea or vomiting. 09/01/17   Henson, Vickie L, NP-C  pantoprazole (PROTONIX) 40 MG tablet Take 1 tablet (40 mg total) by mouth daily at 12 noon. 11/09/17   Ghimire, Henreitta Leber, MD  propranolol (INDERAL) 10 MG tablet Take 20 mg by mouth 2 (two) times daily.     [provider]  SUMAtriptan (IMITREX) 100 MG tablet Take 1 tablet at the first sign of migraine.  May repeat in 2 hours. Patient taking differently: Take 100 mg by mouth every 2 (two) hours as needed for migraine.  07/31/17   Denita Lung, MD  thiamine 100 MG tablet Take 1 tablet (100 mg total) by mouth daily. 11/09/17   Ghimire, Henreitta Leber, MD  traZODone (DESYREL) 50 MG tablet Take 50 mg by mouth at bedtime.     [provider]    Family History Family History  Problem Relation Age of Onset  . Colon cancer Father        Deceased, 56  . Osteoporosis Mother        Living, 70  . Healthy Brother   . Thyroid disease Neg Hx   . Goiter Neg Hx     Social History Social History   Tobacco Use    . Smoking status: Never Smoker  . Smokeless tobacco: Never Used  Substance Use Topics  . Alcohol use: Yes    Comment: bottle of wine daily  . Drug use: No     Allergies   Doxycycline; Codeine; and Tetracycline hcl   Review of Systems Review of Systems  Respiratory: Negative for shortness of breath.   Cardiovascular: Positive for chest pain.  Gastrointestinal: Negative for abdominal pain.  Neurological: Negative for headaches.  All other systems reviewed and are negative.    Physical Exam Updated Vital Signs BP 122/86   Pulse 97   Temp 98.3 F (36.8 C) (Oral)   Resp 16   SpO2 100%   Physical Exam  Constitutional: She is  oriented to person, place, and time. She appears well-developed and well-nourished. No distress.  HENT:  Head: Normocephalic and atraumatic.  Mouth/Throat: Oropharynx is clear and moist.  Eyes: Pupils are equal, round, and reactive to light. Conjunctivae and EOM are normal.  Neck: Normal range of motion. Neck supple.  Cardiovascular: Normal rate, regular rhythm and normal heart sounds.  Pulmonary/Chest: Effort normal and breath sounds normal. No respiratory distress.  Mild tenderness to the anterior posterior aspect of the right thorax.  No crepitus noted.  No step-off noted.  No ecchymosis or external injury noted.  Abdominal: Soft. She exhibits no distension. There is no tenderness.  Musculoskeletal: Normal range of motion. She exhibits no edema or deformity.  Neurological: She is alert and oriented to person, place, and time.  Skin: Skin is warm and dry.  Psychiatric: She has a normal mood and affect.  Nursing note and vitals reviewed.    ED Treatments / Results  Labs (all labs ordered are listed, but only abnormal results are displayed) Labs Reviewed - No data to display  EKG None  Radiology Dg Ribs Unilateral W/chest Right  Result Date: 12/29/2017 CLINICAL DATA:  Right rib pain after fall. EXAM: RIGHT RIBS AND CHEST - 3+ VIEW  COMPARISON:  Right rib radiographs 12/19/2017 FINDINGS: Unchanged appearance of fracture of the posterolateral right eighth rib. The ninth rib fracture is less clearly visualized on the current study. No other rib fracture visualized, specifically at the marked location of pain. The marked location is slightly inferior to the ribcage. Lungs are clear. No pleural effusion or pneumothorax. IMPRESSION: Unchanged appearance of right eighth rib fracture. Previously demonstrated right ninth rib fracture is less well visualized today. This may be projectional. No other fractures. Electronically Signed   By: Ulyses Jarred M.D.   On: 12/29/2017 16:35    Procedures Procedures (including critical care time)  Medications Ordered in ED Medications - No data to display   Initial Impression / Assessment and Plan / ED Course  I have reviewed the triage vital signs and the nursing notes.  Pertinent labs & imaging results that were available during my care of the patient were reviewed by me and considered in my medical decision making (see chart for details).     MDM  Screen complete  Patient is presenting for evaluation of continued right rib pain after recent diagnosis of right rib fracture.  Imaging today does not suggest new acute pathology.  Patient is advised that her concurrent use of alcohol complicates her pain management.  I advised her that continued use of lidocaine patches and voltaren gel was the safest course of action.  Patient understands the need for close follow-up.  Patient understands the need to decrease her daily alcohol consumption.  She has resources available to seek outpatient detox.  She was given a prescription for Librium if she decides that she wants to use it.  Strict return precautions given and understood.  Final Clinical Impressions(s) / ED Diagnoses   Final diagnoses:  Closed fracture of one rib of right side with routine healing, subsequent encounter    ED  Discharge Orders         Ordered    chlordiazePOXIDE (LIBRIUM) 25 MG capsule     12/29/17 1920           Valarie Merino, MD 12/29/17 1958

## 2017-12-29 NOTE — ED Provider Notes (Signed)
Patient placed in Quick Look pathway, seen and evaluated   Chief Complaint: Right rib pain.  HPI: Patient with a history of alcohol abuse, frequent falls, rght eighth and ninth rib fractures 10/13 presents to the emergency department for evaluation of ongoing right rib pain after a mechanical fall which occurred yesterday.  Patient lives alone at home, does not use an assistive device for ambulation.  States that she has had more frequent falls over the past several months.  Yesterday she was walking to the mailbox when she accidentally lost her footing and fell onto her right side where she already has right rib fractures.  She states that she has had worsening pain over her right posterior lateral chest wall ever since.  She is also concerned because her friend recently had a fall and was diagnosed with a brain tumor.  Patient states she has some shortness of breath. Did cough up blood.   ROS: + sob  Physical Exam:   Gen: No distress  Neuro: Awake and Alert  Skin: Warm    Focused Exam: Lungs CTA. No bruising over the right lateral chest or crepitus. Tender to palpation over right posterolateral lower chest wall.    Initiation of care has begun. The patient has been counseled on the process, plan, and necessity for staying for the completion/evaluation, and the remainder of the medical screening examination    Terri Ayala 12/29/17 1455    Quintella Reichert, MD 12/31/17 [phone]

## 2017-12-29 NOTE — Discharge Instructions (Signed)
Return for any problem.  Follow-up with your regular doctor as instructed.  Continue to use lidocaine patches and Voltaren gel as previously prescribed.

## 2017-12-29 NOTE — ED Triage Notes (Signed)
Pt with 1 week history of right rib pain after fall. States fall caused her to hit the right lateral/frontal rib. Denies head injury, loss of consciousness. Denies chest pain, shortness of breath. . She was d/c 10/13 from admission for fall and rib fractures. She states continued pain

## 2017-12-29 NOTE — ED Notes (Signed)
Patient verbalizes understanding of discharge instructions. Opportunity for questioning and answers were provided. Armband removed by staff, pt discharged from ED ambulatory.   

## 2018-01-01 ENCOUNTER — Emergency Department (HOSPITAL_COMMUNITY)
Admission: EM | Admit: 2018-01-01 | Discharge: 2018-01-01 | Disposition: A | Discharge status: Home / Self Care | Payer: Federal, State, Local not specified - PPO | Attending: Emergency Medicine | Admitting: Emergency Medicine

## 2018-01-01 DIAGNOSIS — F419 Anxiety disorder, unspecified: Secondary | ICD-10-CM | POA: Insufficient documentation

## 2018-01-01 DIAGNOSIS — E785 Hyperlipidemia, unspecified: Secondary | ICD-10-CM | POA: Diagnosis not present

## 2018-01-01 DIAGNOSIS — Z79899 Other long term (current) drug therapy: Secondary | ICD-10-CM | POA: Insufficient documentation

## 2018-01-01 DIAGNOSIS — F1010 Alcohol abuse, uncomplicated: Secondary | ICD-10-CM | POA: Diagnosis present

## 2018-01-01 NOTE — ED Triage Notes (Signed)
Transported by [facility] from home--patient experienced a fall on 10/13 fracturing 2 ribs. Patient presents today reporting alcohol withdrawal. +ETOH, patient reports consuming 2 bottles of wine today.

## 2018-01-01 NOTE — ED Notes (Signed)
Bed: [facility] Expected date:  Expected time:  Means of arrival:  Comments: EMS fall pain

## 2018-01-01 NOTE — ED Provider Notes (Signed)
[place] DEPT Provider Note   CSN: [id number] Arrival date & time: 01/01/18  1728     History   Chief Complaint Chief Complaint  Patient presents with  . Fall  . Alcohol Intoxication    HPI Terri Ayala is a 62 y.o. female.  HPI  62yF with fall. She was walking in her home when she fell forward onto her knees. She had a hard time getting back up because she felt generally weak. She has a history of alcohol abuse. She drank two bottles of wine today. She has been seen in the ED several times recently for intoxication/falls including 3 days ago. Denies new injuries today. Continues to have R chest pain from rib fractures.   Past Medical History:  Diagnosis Date  . Alcohol withdrawal ([place]) 11/2017  . Alcoholism ([place])   . Anxiety   . ASYMPTOMATIC POSTMENOPAUSAL STATUS 08/17/2009  . DYSPHAGIA 06/19/2007  . Fatty liver, alcoholic   . GOITER, MULTINODULAR 08/17/2009  . Hyperlipidemia   . Low back pain   . Migraines   . Mitral valve prolapse   . OSTEOPOROSIS 08/17/2009  . PONV (postoperative nausea and vomiting)   . Supraventricular tachycardia [facility])     Patient Active Problem List   Diagnosis Date Noted  . Chest pain 12/08/2017  . Leukocytosis 12/08/2017  . Essential hypertension 12/08/2017  . Alcohol withdrawal ([place]) 07/21/2017  . Major depressive disorder, recurrent episode, moderate ([place])   . Alcohol use disorder, severe, dependence ([place]) 04/03/2014  . Lumbosacral radiculopathy at L5 05/20/2013  . Nonspecific elevation of levels of transaminase or lactic acid dehydrogenase (LDH) 03/16/2013  . Hepatic steatosis 03/16/2013  . Chronic cholecystitis 03/14/2013  . Cervical arthritis 10/12/2012  . Palpitations 11/29/2010  . SVT (supraventricular tachycardia) ([place]) 11/29/2010  . Mitral valve prolapse 11/29/2010  . Decreased libido 11/15/2010  . GOITER, MULTINODULAR 08/17/2009  . ANXIETY 08/17/2009  . Migraine headache 08/17/2009  .  Hearing loss 08/17/2009  . OSTEOPOROSIS 08/17/2009  . ASYMPTOMATIC POSTMENOPAUSAL STATUS 08/17/2009  . DYSPHAGIA 06/19/2007    Past Surgical History:  Procedure Laterality Date  . BREAST BIOPSY Right   . CERVIX SURGERY    . CHOLECYSTECTOMY N/A 03/14/2013   Procedure: LAPAROSCOPIC CHOLECYSTECTOMY WITH ATTEMPTED INTRAOPERATIVE CHOLANGIOGRAM;  Surgeon: Harl Bowie, MD;  Location: [place];  Service: General;  Laterality: N/A;  . laporoscopic abdominal surgery       OB History   None      Home Medications    Prior to Admission medications   Medication Sig Start Date End Date Taking? Authorizing Provider  alendronate (FOSAMAX) 70 MG tablet Take 70 mg by mouth once a week. Take with a full glass of water on an empty stomach.    [provider]  busPIRone (BUSPAR) 15 MG tablet Take 15 mg by mouth 3 (three) times daily.    [provider]  chlordiazePOXIDE (LIBRIUM) 25 MG capsule 25 mg BID x 2 days then 12.5 mg BID x 2 days then 12.5mg  daily for 2 days and stop 12/10/17   Geradine Girt, DO  chlordiazePOXIDE (LIBRIUM) 25 MG capsule 50mg  PO TID x 1D, then 25-50mg  PO BID X 1D, then 25-50mg  PO QD X 1D 12/29/17   Valarie Merino, MD  diclofenac sodium (VOLTAREN) 1 % GEL Apply 2 g topically 4 (four) times daily. 12/19/17   Tasia Catchings, Amy V, PA-C  DULoxetine (CYMBALTA) 60 MG capsule TAKE 1 CAPSULE (60 MG TOTAL) BY MOUTH DAILY. Patient taking  differently: Take 60 mg by mouth daily.  10/23/14   Denita Lung, MD  gabapentin (NEURONTIN) 300 MG capsule Take 1 capsule (300 mg total) by mouth 2 (two) times daily. 12/10/17   Black, Lezlie Octave, NP  hydrOXYzine (ATARAX/VISTARIL) 10 MG tablet Take 10 mg by mouth 2 (two) times daily. 07/10/17   [provider]  lidocaine (LIDODERM) 5 % Place 1 patch onto the skin daily. Remove & Discard patch within 12 hours or as directed by MD 12/19/17   Ok Edwards, PA-C  norethindrone-ethinyl estradiol (FEMHRT 1/5) 1-5 MG-MCG TABS Take 1 tablet by  mouth daily. For Osteoporosis 04/07/14   Lindell Spar I, NP  ondansetron (ZOFRAN ODT) 4 MG disintegrating tablet Take 1 tablet (4 mg total) by mouth every 8 (eight) hours as needed for nausea or vomiting. 09/01/17   Henson, Vickie L, NP-C  pantoprazole (PROTONIX) 40 MG tablet Take 1 tablet (40 mg total) by mouth daily at 12 noon. 11/09/17   Ghimire, Henreitta Leber, MD  propranolol (INDERAL) 10 MG tablet Take 20 mg by mouth 2 (two) times daily.     [provider]  SUMAtriptan (IMITREX) 100 MG tablet Take 1 tablet at the first sign of migraine.  May repeat in 2 hours. Patient taking differently: Take 100 mg by mouth every 2 (two) hours as needed for migraine.  07/31/17   Denita Lung, MD  thiamine 100 MG tablet Take 1 tablet (100 mg total) by mouth daily. 11/09/17   Ghimire, Henreitta Leber, MD  traZODone (DESYREL) 50 MG tablet Take 50 mg by mouth at bedtime.     [provider]    Family History Family History  Problem Relation Age of Onset  . Colon cancer Father        Deceased, 55  . Osteoporosis Mother        Living, 54  . Healthy Brother   . Thyroid disease Neg Hx   . Goiter Neg Hx     Social History Social History   Tobacco Use  . Smoking status: Never Smoker  . Smokeless tobacco: Never Used  Substance Use Topics  . Alcohol use: Yes    Comment: bottle of wine daily  . Drug use: No     Allergies   Doxycycline; Codeine; and Tetracycline hcl   Review of Systems Review of Systems  All systems reviewed and negative, other than as noted in HPI.  Physical Exam Updated Vital Signs BP 118/75   Pulse 92   Temp 97.9 F (36.6 C) (Oral)   Resp 17   SpO2 92%   Physical Exam  Constitutional: She appears well-developed and well-nourished. No distress.  Laying in bed. NAD. Disheveled appearance.   HENT:  Head: Normocephalic and atraumatic.  Eyes: Conjunctivae are normal. Right eye exhibits no discharge. Left eye exhibits no discharge.  Neck: Neck supple.    Cardiovascular: Normal rate, regular rhythm and normal heart sounds. Exam reveals no gallop and no friction rub.  No murmur heard. Pulmonary/Chest: Effort normal and breath sounds normal. No respiratory distress.  Abdominal: Soft. She exhibits no distension. There is no tenderness.  Musculoskeletal: She exhibits no edema or tenderness.  Neurological: She is alert.  Skin: Skin is warm and dry.  Psychiatric: Her behavior is normal. Thought content normal.  Nursing note and vitals reviewed.    ED Treatments / Results  Labs (all labs ordered are listed, but only abnormal results are displayed) Labs Reviewed - No data to display  EKG None  Radiology No results found.  Procedures Procedures (including critical care time)  Medications Ordered in ED Medications - No data to display   Initial Impression / Assessment and Plan / ED Course  I have reviewed the triage vital signs and the nursing notes.  Pertinent labs & imaging results that were available during my care of the patient were reviewed by me and considered in my medical decision making (see chart for details).     62yF with alcohol abuse. She says she has a plan to quit which includes going to [facility]. Encouraged to follow through with whatever plan she may have. Many of her problems stem for her alcohol abuse and she is going to continue to feel poorly until this is addressed. She denies SI or HI. She is concerned she may withdrawal. Clinically she is not currently. She was given a prescription for librium on her last ED visit but continues to drink.   Final Clinical Impressions(s) / ED Diagnoses   Final diagnoses:  Alcohol abuse    ED Discharge Orders    None       Virgel Manifold, MD 01/01/18 1807

## 2018-01-01 NOTE — ED Notes (Signed)
ED Provider at bedside. 

## 2018-01-04 ENCOUNTER — Emergency Department (HOSPITAL_COMMUNITY): Payer: Federal, State, Local not specified - PPO

## 2018-01-04 ENCOUNTER — Other Ambulatory Visit: Payer: Self-pay

## 2018-01-04 ENCOUNTER — Encounter (HOSPITAL_COMMUNITY): Payer: Self-pay | Admitting: Emergency Medicine

## 2018-01-04 ENCOUNTER — Inpatient Hospital Stay (HOSPITAL_COMMUNITY)
Admission: EM | Admit: 2018-01-04 | Discharge: 2018-01-10 | DRG: 897 | Disposition: A | Discharge status: Home Health | Payer: Federal, State, Local not specified - PPO | Attending: Internal Medicine | Admitting: Internal Medicine

## 2018-01-04 DIAGNOSIS — F1020 Alcohol dependence, uncomplicated: Secondary | ICD-10-CM | POA: Diagnosis not present

## 2018-01-04 DIAGNOSIS — Z885 Allergy status to narcotic agent status: Secondary | ICD-10-CM

## 2018-01-04 DIAGNOSIS — F10930 Alcohol use, unspecified with withdrawal, uncomplicated: Secondary | ICD-10-CM

## 2018-01-04 DIAGNOSIS — S2241XD Multiple fractures of ribs, right side, subsequent encounter for fracture with routine healing: Secondary | ICD-10-CM | POA: Diagnosis not present

## 2018-01-04 DIAGNOSIS — F10239 Alcohol dependence with withdrawal, unspecified: Secondary | ICD-10-CM | POA: Diagnosis present

## 2018-01-04 DIAGNOSIS — E785 Hyperlipidemia, unspecified: Secondary | ICD-10-CM | POA: Diagnosis present

## 2018-01-04 DIAGNOSIS — S2232XA Fracture of one rib, left side, initial encounter for closed fracture: Secondary | ICD-10-CM | POA: Diagnosis present

## 2018-01-04 DIAGNOSIS — M810 Age-related osteoporosis without current pathological fracture: Secondary | ICD-10-CM | POA: Diagnosis present

## 2018-01-04 DIAGNOSIS — F10230 Alcohol dependence with withdrawal, uncomplicated: Secondary | ICD-10-CM

## 2018-01-04 DIAGNOSIS — F419 Anxiety disorder, unspecified: Secondary | ICD-10-CM | POA: Diagnosis present

## 2018-01-04 DIAGNOSIS — R079 Chest pain, unspecified: Secondary | ICD-10-CM | POA: Diagnosis not present

## 2018-01-04 DIAGNOSIS — F10231 Alcohol dependence with withdrawal delirium: Secondary | ICD-10-CM | POA: Diagnosis not present

## 2018-01-04 DIAGNOSIS — I10 Essential (primary) hypertension: Secondary | ICD-10-CM | POA: Diagnosis not present

## 2018-01-04 DIAGNOSIS — W19XXXA Unspecified fall, initial encounter: Secondary | ICD-10-CM | POA: Diagnosis not present

## 2018-01-04 DIAGNOSIS — Z781 Physical restraint status: Secondary | ICD-10-CM

## 2018-01-04 DIAGNOSIS — F10939 Alcohol use, unspecified with withdrawal, unspecified: Secondary | ICD-10-CM | POA: Diagnosis present

## 2018-01-04 DIAGNOSIS — W109XXA Fall (on) (from) unspecified stairs and steps, initial encounter: Secondary | ICD-10-CM | POA: Diagnosis present

## 2018-01-04 DIAGNOSIS — F10232 Alcohol dependence with withdrawal with perceptual disturbance: Secondary | ICD-10-CM

## 2018-01-04 DIAGNOSIS — Z8262 Family history of osteoporosis: Secondary | ICD-10-CM

## 2018-01-04 DIAGNOSIS — D6959 Other secondary thrombocytopenia: Secondary | ICD-10-CM | POA: Diagnosis present

## 2018-01-04 DIAGNOSIS — Z79899 Other long term (current) drug therapy: Secondary | ICD-10-CM

## 2018-01-04 DIAGNOSIS — S2231XA Fracture of one rib, right side, initial encounter for closed fracture: Secondary | ICD-10-CM | POA: Diagnosis not present

## 2018-01-04 DIAGNOSIS — S2239XA Fracture of one rib, unspecified side, initial encounter for closed fracture: Secondary | ICD-10-CM | POA: Diagnosis present

## 2018-01-04 DIAGNOSIS — S2241XA Multiple fractures of ribs, right side, initial encounter for closed fracture: Secondary | ICD-10-CM | POA: Diagnosis present

## 2018-01-04 DIAGNOSIS — Z888 Allergy status to other drugs, medicaments and biological substances status: Secondary | ICD-10-CM

## 2018-01-04 DIAGNOSIS — Z9181 History of falling: Secondary | ICD-10-CM

## 2018-01-04 DIAGNOSIS — F10929 Alcohol use, unspecified with intoxication, unspecified: Secondary | ICD-10-CM | POA: Diagnosis not present

## 2018-01-04 DIAGNOSIS — Z7983 Long term (current) use of bisphosphonates: Secondary | ICD-10-CM

## 2018-01-04 DIAGNOSIS — K709 Alcoholic liver disease, unspecified: Secondary | ICD-10-CM | POA: Diagnosis present

## 2018-01-04 DIAGNOSIS — E876 Hypokalemia: Secondary | ICD-10-CM | POA: Diagnosis not present

## 2018-01-04 HISTORY — DX: Low back pain: M54.5

## 2018-01-04 HISTORY — DX: Low back pain, unspecified: M54.50

## 2018-01-04 HISTORY — DX: Gastro-esophageal reflux disease without esophagitis: K21.9

## 2018-01-04 HISTORY — DX: Other chronic pain: G89.29

## 2018-01-04 HISTORY — DX: Major depressive disorder, single episode, unspecified: F32.9

## 2018-01-04 HISTORY — DX: Depression, unspecified: F32.A

## 2018-01-04 HISTORY — DX: Unspecified osteoarthritis, unspecified site: M19.90

## 2018-01-04 LAB — CBC WITH DIFFERENTIAL/PLATELET
ABS IMMATURE GRANULOCYTES: 0.04 10*3/uL (ref 0.00–0.07)
BASOS PCT: 1 %
Basophils Absolute: 0.1 10*3/uL (ref 0.0–0.1)
Eosinophils Absolute: 0.1 10*3/uL (ref 0.0–0.5)
Eosinophils Relative: 1 %
HCT: 39.3 % (ref 36.0–46.0)
HEMOGLOBIN: 12.6 g/dL (ref 12.0–15.0)
IMMATURE GRANULOCYTES: 1 %
LYMPHS PCT: 6 %
Lymphs Abs: 0.5 10*3/uL — ABNORMAL LOW (ref 0.7–4.0)
MCH: 33.2 pg (ref 26.0–34.0)
MCHC: 32.1 g/dL (ref 30.0–36.0)
MCV: 103.4 fL — AB (ref 80.0–100.0)
MONO ABS: 0.7 10*3/uL (ref 0.1–1.0)
Monocytes Relative: 9 %
NEUTROS ABS: 6.8 10*3/uL (ref 1.7–7.7)
NEUTROS PCT: 82 %
PLATELETS: 132 10*3/uL — AB (ref 150–400)
RBC: 3.8 MIL/uL — ABNORMAL LOW (ref 3.87–5.11)
RDW: 12.5 % (ref 11.5–15.5)
WBC: 8.2 10*3/uL (ref 4.0–10.5)
nRBC: 0 % (ref 0.0–0.2)

## 2018-01-04 LAB — URINALYSIS, ROUTINE W REFLEX MICROSCOPIC
Bacteria, UA: NONE SEEN
Bilirubin Urine: NEGATIVE
GLUCOSE, UA: NEGATIVE mg/dL
KETONES UR: 5 mg/dL — AB
Leukocytes, UA: NEGATIVE
NITRITE: NEGATIVE
PH: 7 (ref 5.0–8.0)
Protein, ur: NEGATIVE mg/dL
Specific Gravity, Urine: 1.01 (ref 1.005–1.030)

## 2018-01-04 LAB — COMPREHENSIVE METABOLIC PANEL
ALK PHOS: 86 U/L (ref 38–126)
ALT: 16 U/L (ref 0–44)
AST: 74 U/L — AB (ref 15–41)
Albumin: 3.9 g/dL (ref 3.5–5.0)
Anion gap: 13 (ref 5–15)
CALCIUM: 8.8 mg/dL — AB (ref 8.9–10.3)
CO2: 23 mmol/L (ref 22–32)
CREATININE: 0.66 mg/dL (ref 0.44–1.00)
Chloride: 102 mmol/L (ref 98–111)
GFR calc Af Amer: >60 mL/min (ref >60)
GFR calc non Af Amer: >60 mL/min (ref >60)
Glucose, Bld: 99 mg/dL (ref 70–99)
Potassium: 4.5 mmol/L (ref 3.5–5.1)
Sodium: 138 mmol/L (ref 135–145)
Total Bilirubin: 0.9 mg/dL (ref 0.3–1.2)
Total Protein: 7.4 g/dL (ref 6.5–8.1)

## 2018-01-04 LAB — LIPASE, BLOOD: Lipase: 42 U/L (ref 11–51)

## 2018-01-04 LAB — PROTIME-INR
INR: 0.93
Prothrombin Time: 12.4 seconds (ref 11.4–15.2)

## 2018-01-04 LAB — RAPID URINE DRUG SCREEN, HOSP PERFORMED
AMPHETAMINES: NOT DETECTED
BARBITURATES: NOT DETECTED
Benzodiazepines: NOT DETECTED
Cocaine: NOT DETECTED
Opiates: NOT DETECTED
TETRAHYDROCANNABINOL: NOT DETECTED

## 2018-01-04 LAB — ETHANOL: ALCOHOL ETHYL (B): 26 mg/dL — AB (ref <10)

## 2018-01-04 LAB — AMMONIA: Ammonia: 44 umol/L — ABNORMAL HIGH (ref 9–35)

## 2018-01-04 MED ORDER — FOLIC ACID 1 MG PO TABS
1.0000 mg | ORAL_TABLET | Freq: Every day | ORAL | Status: DC
  Administered 2018-01-05 – 2018-01-10 (×6): 1 mg via ORAL
  Filled 2018-01-04 (×6): qty 1

## 2018-01-04 MED ORDER — ENOXAPARIN SODIUM 40 MG/0.4ML ~~LOC~~ SOLN
40.0000 mg | SUBCUTANEOUS | Status: DC
  Administered 2018-01-04 – 2018-01-09 (×6): 40 mg via SUBCUTANEOUS
  Filled 2018-01-04 (×6): qty 0.4

## 2018-01-04 MED ORDER — BUSPIRONE HCL 10 MG PO TABS
15.0000 mg | ORAL_TABLET | Freq: Three times a day (TID) | ORAL | Status: DC
  Administered 2018-01-04 – 2018-01-07 (×10): 15 mg via ORAL
  Administered 2018-01-08: 20 mg via ORAL
  Administered 2018-01-08 – 2018-01-10 (×6): 15 mg via ORAL
  Filled 2018-01-04 (×19): qty 1.5

## 2018-01-04 MED ORDER — SODIUM CHLORIDE 0.9% FLUSH
3.0000 mL | Freq: Two times a day (BID) | INTRAVENOUS | Status: DC
  Administered 2018-01-04 – 2018-01-10 (×9): 3 mL via INTRAVENOUS

## 2018-01-04 MED ORDER — LORAZEPAM 2 MG/ML IJ SOLN
0.5000 mg | Freq: Once | INTRAMUSCULAR | Status: AC
  Administered 2018-01-04: 0.5 mg via INTRAVENOUS
  Filled 2018-01-04: qty 1

## 2018-01-04 MED ORDER — PANTOPRAZOLE SODIUM 40 MG PO TBEC
40.0000 mg | DELAYED_RELEASE_TABLET | Freq: Every day | ORAL | Status: DC
  Administered 2018-01-05 – 2018-01-10 (×6): 40 mg via ORAL
  Filled 2018-01-04 (×7): qty 1

## 2018-01-04 MED ORDER — LORAZEPAM 2 MG/ML IJ SOLN
2.0000 mg | INTRAMUSCULAR | Status: DC | PRN
  Administered 2018-01-04 – 2018-01-09 (×11): 2 mg via INTRAVENOUS
  Filled 2018-01-04 (×12): qty 1

## 2018-01-04 MED ORDER — ONDANSETRON HCL 4 MG PO TABS: 4.0000 mg | ORAL_TABLET | Freq: Four times a day (QID) | ORAL | Status: DC | PRN

## 2018-01-04 MED ORDER — LORAZEPAM 2 MG/ML IJ SOLN
1.0000 mg | Freq: Once | INTRAMUSCULAR | Status: AC
  Administered 2018-01-04: 1 mg via INTRAVENOUS
  Filled 2018-01-04: qty 1

## 2018-01-04 MED ORDER — DULOXETINE HCL 60 MG PO CPEP
60.0000 mg | ORAL_CAPSULE | Freq: Every day | ORAL | Status: DC
  Administered 2018-01-05 – 2018-01-10 (×6): 60 mg via ORAL
  Filled 2018-01-04 (×7): qty 1

## 2018-01-04 MED ORDER — ADULT MULTIVITAMIN W/MINERALS CH
1.0000 | ORAL_TABLET | Freq: Every day | ORAL | Status: DC
  Administered 2018-01-05 – 2018-01-10 (×6): 1 via ORAL
  Filled 2018-01-04 (×7): qty 1

## 2018-01-04 MED ORDER — LACTATED RINGERS IV SOLN
INTRAVENOUS | Status: DC
  Administered 2018-01-04: 19:00:00 via INTRAVENOUS

## 2018-01-04 MED ORDER — PROPRANOLOL HCL 20 MG PO TABS
20.0000 mg | ORAL_TABLET | Freq: Two times a day (BID) | ORAL | Status: DC
  Administered 2018-01-04 – 2018-01-10 (×12): 20 mg via ORAL
  Filled 2018-01-04 (×12): qty 1

## 2018-01-04 MED ORDER — DOCUSATE SODIUM 100 MG PO CAPS
100.0000 mg | ORAL_CAPSULE | Freq: Two times a day (BID) | ORAL | Status: DC
  Administered 2018-01-04 – 2018-01-10 (×13): 100 mg via ORAL
  Filled 2018-01-04 (×13): qty 1

## 2018-01-04 MED ORDER — DIAZEPAM 5 MG PO TABS
5.0000 mg | ORAL_TABLET | Freq: Four times a day (QID) | ORAL | Status: DC
  Administered 2018-01-04 – 2018-01-09 (×18): 5 mg via ORAL
  Filled 2018-01-04 (×20): qty 1

## 2018-01-04 MED ORDER — ACETAMINOPHEN 650 MG RE SUPP: 650.0000 mg | Freq: Four times a day (QID) | RECTAL | Status: DC | PRN

## 2018-01-04 MED ORDER — THIAMINE HCL 100 MG/ML IJ SOLN
100.0000 mg | Freq: Once | INTRAMUSCULAR | Status: AC
  Administered 2018-01-04: 100 mg via INTRAVENOUS
  Filled 2018-01-04: qty 2

## 2018-01-04 MED ORDER — ACETAMINOPHEN 325 MG PO TABS
650.0000 mg | ORAL_TABLET | Freq: Four times a day (QID) | ORAL | Status: DC | PRN
  Administered 2018-01-06: 650 mg via ORAL
  Filled 2018-01-04: qty 2

## 2018-01-04 MED ORDER — LIDOCAINE 5 % EX PTCH
1.0000 | MEDICATED_PATCH | CUTANEOUS | Status: DC
  Administered 2018-01-04 – 2018-01-09 (×5): 1 via TRANSDERMAL
  Filled 2018-01-04 (×6): qty 1

## 2018-01-04 MED ORDER — ONDANSETRON HCL 4 MG/2ML IJ SOLN: 4.0000 mg | Freq: Four times a day (QID) | INTRAMUSCULAR | Status: DC | PRN

## 2018-01-04 MED ORDER — FOLIC ACID 1 MG PO TABS
1.0000 mg | ORAL_TABLET | Freq: Once | ORAL | Status: AC
  Administered 2018-01-04: 1 mg via ORAL
  Filled 2018-01-04: qty 1

## 2018-01-04 MED ORDER — GABAPENTIN 300 MG PO CAPS
300.0000 mg | ORAL_CAPSULE | Freq: Two times a day (BID) | ORAL | Status: DC
  Administered 2018-01-04 – 2018-01-10 (×12): 300 mg via ORAL
  Filled 2018-01-04 (×12): qty 1

## 2018-01-04 MED ORDER — TRAZODONE HCL 50 MG PO TABS
50.0000 mg | ORAL_TABLET | Freq: Every day | ORAL | Status: DC
  Administered 2018-01-04 – 2018-01-09 (×6): 50 mg via ORAL
  Filled 2018-01-04 (×6): qty 1

## 2018-01-04 MED ORDER — VITAMIN B-1 100 MG PO TABS
100.0000 mg | ORAL_TABLET | Freq: Every day | ORAL | Status: DC
  Administered 2018-01-05 – 2018-01-10 (×6): 100 mg via ORAL
  Filled 2018-01-04 (×6): qty 1

## 2018-01-04 MED ORDER — SODIUM CHLORIDE 0.9 % IV BOLUS
1000.0000 mL | Freq: Once | INTRAVENOUS | Status: AC
  Administered 2018-01-04: 1000 mL via INTRAVENOUS

## 2018-01-04 NOTE — ED Notes (Signed)
ED Provider at bedside. 

## 2018-01-04 NOTE — ED Provider Notes (Signed)
[place] EMERGENCY DEPARTMENT Provider Note   CSN: [id number] Arrival date & time:        History   Chief Complaint Chief Complaint  Patient presents with  . Alcohol Intoxication    HPI Terri Ayala is a 62 y.o. female.  HPI  62 year old female presents from home with reports that she fell and is undergoing alcohol withdrawal.  She reports she last had alcohol intake last night.  She has fallen multiple times recently reports that she has refractured from this.  She reports that today she fell down several stairs.  Denies head injury or loss of consciousness or other new injury.  She is nauseated and has vomited in route via EMS.  Past Medical History:  Diagnosis Date  . Alcohol withdrawal ([place]) 11/2017  . Alcoholism ([place])   . Anxiety   . ASYMPTOMATIC POSTMENOPAUSAL STATUS 08/17/2009  . DYSPHAGIA 06/19/2007  . Fatty liver, alcoholic   . GOITER, MULTINODULAR 08/17/2009  . Hyperlipidemia   . Low back pain   . Migraines   . Mitral valve prolapse   . OSTEOPOROSIS 08/17/2009  . PONV (postoperative nausea and vomiting)   . Supraventricular tachycardia [facility])     Patient Active Problem List   Diagnosis Date Noted  . Chest pain 12/08/2017  . Leukocytosis 12/08/2017  . Essential hypertension 12/08/2017  . Alcohol withdrawal ([place]) 07/21/2017  . Major depressive disorder, recurrent episode, moderate ([place])   . Alcohol use disorder, severe, dependence ([place]) 04/03/2014  . Lumbosacral radiculopathy at L5 05/20/2013  . Nonspecific elevation of levels of transaminase or lactic acid dehydrogenase (LDH) 03/16/2013  . Hepatic steatosis 03/16/2013  . Chronic cholecystitis 03/14/2013  . Cervical arthritis 10/12/2012  . Palpitations 11/29/2010  . SVT (supraventricular tachycardia) ([place]) 11/29/2010  . Mitral valve prolapse 11/29/2010  . Decreased libido 11/15/2010  . GOITER, MULTINODULAR 08/17/2009  . ANXIETY 08/17/2009  . Migraine headache 08/17/2009  . Hearing  loss 08/17/2009  . OSTEOPOROSIS 08/17/2009  . ASYMPTOMATIC POSTMENOPAUSAL STATUS 08/17/2009  . DYSPHAGIA 06/19/2007    Past Surgical History:  Procedure Laterality Date  . BREAST BIOPSY Right   . CERVIX SURGERY    . CHOLECYSTECTOMY N/A 03/14/2013   Procedure: LAPAROSCOPIC CHOLECYSTECTOMY WITH ATTEMPTED INTRAOPERATIVE CHOLANGIOGRAM;  Surgeon: Harl Bowie, MD;  Location: [place];  Service: General;  Laterality: N/A;  . laporoscopic abdominal surgery       OB History   None      Home Medications    Prior to Admission medications   Medication Sig Start Date End Date Taking? Authorizing Provider  alendronate (FOSAMAX) 70 MG tablet Take 70 mg by mouth once a week. Take with a full glass of water on an empty stomach.    [provider]  busPIRone (BUSPAR) 15 MG tablet Take 15 mg by mouth 3 (three) times daily.    [provider]  chlordiazePOXIDE (LIBRIUM) 25 MG capsule 25 mg BID x 2 days then 12.5 mg BID x 2 days then 12.5mg  daily for 2 days and stop 12/10/17   Geradine Girt, DO  chlordiazePOXIDE (LIBRIUM) 25 MG capsule 50mg  PO TID x 1D, then 25-50mg  PO BID X 1D, then 25-50mg  PO QD X 1D 12/29/17   Valarie Merino, MD  diclofenac sodium (VOLTAREN) 1 % GEL Apply 2 g topically 4 (four) times daily. 12/19/17   Tasia Catchings, Amy V, PA-C  DULoxetine (CYMBALTA) 60 MG capsule TAKE 1 CAPSULE (60 MG TOTAL) BY MOUTH DAILY. Patient taking differently: Take 60  mg by mouth daily.  10/23/14   Denita Lung, MD  gabapentin (NEURONTIN) 300 MG capsule Take 1 capsule (300 mg total) by mouth 2 (two) times daily. 12/10/17   Black, Lezlie Octave, NP  hydrOXYzine (ATARAX/VISTARIL) 10 MG tablet Take 10 mg by mouth 2 (two) times daily. 07/10/17   [provider]  lidocaine (LIDODERM) 5 % Place 1 patch onto the skin daily. Remove & Discard patch within 12 hours or as directed by MD 12/19/17   Ok Edwards, PA-C  norethindrone-ethinyl estradiol (FEMHRT 1/5) 1-5 MG-MCG TABS Take 1 tablet by mouth  daily. For Osteoporosis 04/07/14   Lindell Spar I, NP  ondansetron (ZOFRAN ODT) 4 MG disintegrating tablet Take 1 tablet (4 mg total) by mouth every 8 (eight) hours as needed for nausea or vomiting. 09/01/17   Henson, Vickie L, NP-C  pantoprazole (PROTONIX) 40 MG tablet Take 1 tablet (40 mg total) by mouth daily at 12 noon. 11/09/17   Ghimire, Henreitta Leber, MD  propranolol (INDERAL) 10 MG tablet Take 20 mg by mouth 2 (two) times daily.     [provider]  SUMAtriptan (IMITREX) 100 MG tablet Take 1 tablet at the first sign of migraine.  May repeat in 2 hours. Patient taking differently: Take 100 mg by mouth every 2 (two) hours as needed for migraine.  07/31/17   Denita Lung, MD  thiamine 100 MG tablet Take 1 tablet (100 mg total) by mouth daily. 11/09/17   Ghimire, Henreitta Leber, MD  traZODone (DESYREL) 50 MG tablet Take 50 mg by mouth at bedtime.     [provider]    Family History Family History  Problem Relation Age of Onset  . Colon cancer Father        Deceased, 14  . Osteoporosis Mother        Living, 36  . Healthy Brother   . Thyroid disease Neg Hx   . Goiter Neg Hx     Social History Social History   Tobacco Use  . Smoking status: Never Smoker  . Smokeless tobacco: Never Used  Substance Use Topics  . Alcohol use: Yes    Comment: bottle of wine daily  . Drug use: No     Allergies   Doxycycline; Codeine; and Tetracycline hcl   Review of Systems Review of Systems  Constitutional: Positive for activity change.  Eyes: Negative.   Respiratory: Negative.   Cardiovascular: Positive for chest pain.  Gastrointestinal: Positive for vomiting.  Endocrine: Negative.   Genitourinary: Negative.   Musculoskeletal: Negative.   All other systems reviewed and are negative.    Physical Exam Updated Vital Signs SpO2 100%   Physical Exam  Constitutional: She is oriented to person, place, and time. She appears well-developed and well-nourished.  HENT:  Head:  Normocephalic and atraumatic.  Eyes: Pupils are equal, round, and reactive to light. EOM are normal.  Neck: Normal range of motion. Neck supple.  Cardiovascular: Normal rate, regular rhythm and normal heart sounds.  Pulmonary/Chest: Effort normal and breath sounds normal.  Abdominal: Soft. Bowel sounds are normal.  Musculoskeletal: Normal range of motion.  Neurological: She is alert and oriented to person, place, and time.  Tremulous  Skin: Skin is warm and dry. Capillary refill takes less than 2 seconds.  Psychiatric: She has a normal mood and affect. Her behavior is normal.  Nursing note and vitals reviewed.    ED Treatments / Results  Labs (all labs ordered are listed, but only abnormal  results are displayed) Labs Reviewed - No data to display  EKG EKG Interpretation  Date/Time:  Thursday January 04 2018 14:26:32 EST Ventricular Rate:  92 PR Interval:    QRS Duration: 82 QT Interval:  374 QTC Calculation: 463 R Axis:   42 Text Interpretation:  Sinus rhythm Confirmed by Pattricia Boss [phone]) on 01/04/2018 3:21:07 PM   Radiology Dg Chest Port 1 View  Result Date: 01/04/2018 CLINICAL DATA:  Patient states she has right rib fractures from a fall last week. EXAM: PORTABLE CHEST 1 VIEW COMPARISON:  December 29, 2017 FINDINGS: The patient has known rib fractures are not as well visualized on this portable chest x-Kamani Lewter. The heart, hila, mediastinum, lungs, and pleura are unremarkable. No pneumothorax. IMPRESSION: The known right eighth rib fracture is not as well appreciated on this study. No acute abnormality. No pneumothorax. Electronically Signed   By: Dorise Bullion III M.D   On: 01/04/2018 14:42    Procedures .Critical Care Performed by: Pattricia Boss, MD Authorized by: Pattricia Boss, MD   Critical care provider statement:    Critical care time (minutes):  45   Critical care end time:  01/04/2018 3:51 PM   Critical care was necessary to treat or prevent imminent or  life-threatening deterioration of the following conditions: alcohol withdrawal.   Critical care was time spent personally by me on the following activities:  Discussions with consultants, evaluation of patient's response to treatment, examination of patient, ordering and performing treatments and interventions, ordering and review of laboratory studies, ordering and review of radiographic studies, pulse oximetry, re-evaluation of patient's condition, obtaining history from patient or surrogate and review of old charts   (including critical care time)  Medications Ordered in ED Medications - No data to display   Initial Impression / Assessment and Plan / ED Course  I have reviewed the triage vital signs and the nursing notes.  Pertinent labs & imaging results that were available during my care of the patient were reviewed by me and considered in my medical decision making (see chart for details).    62 yo female with ho alcohol abuse who reports that she is trying to stop drinking etoh with last intake last night.  She has fallen multiple times recently, including today- no new injuries noted.  She has a known rib fx which is unchanged on cxr Patient receiving iv fluids and ativan here- but continues tremulous.  CIWA protocol initiated.  Patient hypertensive bp decreased to normal after second ativan.  Folate and thiamine ordered. Plan admission for etoh detox Vitals:   01/04/18 1502 01/04/18 1515  BP: (!) 153/106 (!) 109/91  Pulse: 98 95  Resp:  (!) 23  Temp:    SpO2:  100%   Discussed with Dr. Lorin Mercy and she will see for admission  Final Clinical Impressions(s) / ED Diagnoses   Final diagnoses:  Alcohol withdrawal syndrome without complication [facility])    ED Discharge Orders    None       Pattricia Boss, MD 01/04/18 1609

## 2018-01-04 NOTE — ED Triage Notes (Signed)
Pt arrives to ED from home with complaints of alcohol withdraw with her last drink being last night. EMS reports that the pt started withdrawing this morning and she fell while walking up the stairs in her home. Pt reports pain to R rib cage. Pt placed in position of comfort with bed locked and lowered, call bell in reach.

## 2018-01-04 NOTE — Progress Notes (Signed)
Pt admitted to 3W6 from ER.  Pt states she fell down some stairs at home today, and that she was here for alcohol withdrawal.  Pt is alert and oriented at present.  States she has rib pain with movement.  She also has tremors; denies numbness and tingling.  Placed on monitor X07; CCMD called and second verification completed.  Bed alarm set and patient verbalizes understanding to call before getting out of bed.

## 2018-01-04 NOTE — H&P (Signed)
History and Physical    LEVANA MINETTI SNK:[id number] DOB: April 07, 1955 DOA: 01/04/2018  PCP: Denita Lung, MD  Patient coming from:  Home - lives alone; NOK: "Well, I have a plan.  I have an extra room above the garage.  It's carpeted and painted.  It just needs to be cleaned up.  I could rent it."  Chief Complaint:  ETOH withdrawal  HPI: Terri Ayala is a 62 y.o. female with medical history significant of alcohol dependence and HLD presenting with alcohol withdrawal.   She fell on the stairs.  She was trying to get upstairs for the first time since she broke her ribs - she fell here in the ER previously and broke ribs.  Now, "it's painful to breathe.[name] Kitchen And with certain movements."  Last drink was last night.  She does want to quit.  Now, "I have a plan, I'm not going to go away with broken ribs, that won't be too comfortable."  After that, she is thinking of going to [facility] and then maybe to a 28 day program.  She has "done all this before.  Just in a rough spot."  She thinks she last went in rehab in the spring and was sober a couple of months after.  The first time she went to a 28-day program, she was sober for about 15 months and went to [facility].  She is feeling anxious/jittery.  No AV hallucinations.  No SI/HI.  On average, drinks 1 1/2 bottles of wine.  Her last day of not drinking was Christmas.  Her last drink was last night, wine.  ED Course:  Known alcoholic.  Recent fall with broken rib.  She stopped drinking last night.  Shaking, elevated BP - improved with BZD.  Review of Systems: As per HPI; otherwise review of systems reviewed and negative.   Ambulatory Status:  Ambulates without assistance  Past Medical History:  Diagnosis Date  . Alcohol withdrawal ([place]) 11/2017  . Alcoholism ([place])   . Anxiety   . ASYMPTOMATIC POSTMENOPAUSAL STATUS 08/17/2009  . DYSPHAGIA 06/19/2007  . Fatty liver, alcoholic   . GOITER, MULTINODULAR 08/17/2009  . Hyperlipidemia   . Low back pain   .  Migraines   . Mitral valve prolapse   . OSTEOPOROSIS 08/17/2009  . PONV (postoperative nausea and vomiting)   . Supraventricular tachycardia [facility])     Past Surgical History:  Procedure Laterality Date  . BREAST BIOPSY Right   . CERVIX SURGERY    . CHOLECYSTECTOMY N/A 03/14/2013   Procedure: LAPAROSCOPIC CHOLECYSTECTOMY WITH ATTEMPTED INTRAOPERATIVE CHOLANGIOGRAM;  Surgeon: Harl Bowie, MD;  Location: [facility];  Service: General;  Laterality: N/A;  . laporoscopic abdominal surgery      Social History   Socioeconomic History  . Marital status: Single    Spouse name: Not on file  . Number of children: Not on file  . Years of education: Not on file  . Highest education level: Not on file  Occupational History  . Occupation: Brewing technologist - retired    Fish farm manager: [employer]  Social Needs  . Financial resource strain: Not on file  . Food insecurity:    Worry: Not on file    Inability: Not on file  . Transportation needs:    Medical: Not on file    Non-medical: Not on file  Tobacco Use  . Smoking status: Never Smoker  . Smokeless tobacco: Never Used  Substance and Sexual Activity  . Alcohol use:  Yes    Comment: bottle of wine daily  . Drug use: No  . Sexual activity: Never  Lifestyle  . Physical activity:    Days per week: Not on file    Minutes per session: Not on file  . Stress: Not on file  Relationships  . Social connections:    Talks on phone: Not on file    Gets together: Not on file    Attends religious service: Not on file    Active member of club or organization: Not on file    Attends meetings of clubs or organizations: Not on file    Relationship status: Not on file  . Intimate partner violence:    Fear of current or ex partner: Not on file    Emotionally abused: Not on file    Physically abused: Not on file    Forced sexual activity: Not on file  Other Topics Concern  . Not on file  Social History Narrative   Lives alone.  She has a BS  biology.   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    Allergies  Allergen Reactions  . Doxycycline Swelling    Mouth swelling and sores  . Codeine Itching  . Tetracycline Hcl Swelling    Mouth swelling and sores    Family History  Problem Relation Age of Onset  . Colon cancer Father        Deceased, 50  . Osteoporosis Mother        Living, 57  . Healthy Brother   . Thyroid disease Neg Hx   . Goiter Neg Hx     Prior to Admission medications   Medication Sig Start Date End Date Taking? Authorizing Provider  alendronate (FOSAMAX) 70 MG tablet Take 70 mg by mouth once a week. Take with a full glass of water on an empty stomach.   Yes [provider]  busPIRone (BUSPAR) 15 MG tablet Take 15 mg by mouth 3 (three) times daily.   Yes [provider]  diclofenac sodium (VOLTAREN) 1 % GEL Apply 2 g topically 4 (four) times daily. 12/19/17  Yes Yu, Amy V, PA-C  gabapentin (NEURONTIN) 300 MG capsule Take 1 capsule (300 mg total) by mouth 2 (two) times daily. 12/10/17  Yes Black, Lezlie Octave, NP  lidocaine (LIDODERM) 5 % Place 1 patch onto the skin daily. Remove & Discard patch within 12 hours or as directed by MD 12/19/17  Yes Tasia Catchings, Amy V, PA-C  SUMAtriptan (IMITREX) 100 MG tablet Take 1 tablet at the first sign of migraine.  May repeat in 2 hours. Patient taking differently: Take 100 mg by mouth every 2 (two) hours as needed for migraine.  07/31/17  Yes Denita Lung, MD  traZODone (DESYREL) 50 MG tablet Take 50 mg by mouth at bedtime.    Yes [provider]  chlordiazePOXIDE (LIBRIUM) 25 MG capsule 25 mg BID x 2 days then 12.5 mg BID x 2 days then 12.5mg  daily for 2 days and stop Patient not taking: Reported on 01/04/2018 12/10/17   Geradine Girt, DO  chlordiazePOXIDE (LIBRIUM) 25 MG capsule 50mg  PO TID x 1D, then 25-50mg  PO BID X 1D, then 25-50mg  PO QD X 1D Patient not taking: Reported on 01/04/2018 12/29/17   Valarie Merino, MD    DULoxetine (CYMBALTA) 60 MG capsule TAKE 1 CAPSULE (60 MG TOTAL) BY MOUTH DAILY. Patient taking differently: Take 60 mg by mouth daily.  10/23/14  Denita Lung, MD  norethindrone-ethinyl estradiol (FEMHRT 1/5) 1-5 MG-MCG TABS Take 1 tablet by mouth daily. For Osteoporosis 04/07/14   Lindell Spar I, NP  ondansetron (ZOFRAN ODT) 4 MG disintegrating tablet Take 1 tablet (4 mg total) by mouth every 8 (eight) hours as needed for nausea or vomiting. 09/01/17   Henson, Vickie L, NP-C  pantoprazole (PROTONIX) 40 MG tablet Take 1 tablet (40 mg total) by mouth daily at 12 noon. 11/09/17   Ghimire, Henreitta Leber, MD  propranolol (INDERAL) 10 MG tablet Take 20 mg by mouth 2 (two) times daily.     [provider]  thiamine 100 MG tablet Take 1 tablet (100 mg total) by mouth daily. 11/09/17   Jonetta Osgood, MD    Physical Exam: Vitals:   01/04/18 1545 01/04/18 1600 01/04/18 1600 01/04/18 1630  BP: (!) 148/88 (!) 152/97 (!) 152/97 (!) 144/98  Pulse: 92 100 96 (!) 104  Resp: (!) 27 (!) 24  (!) 22  Temp:      TempSrc:      SpO2: 100% 97%  100%  Weight:      Height:         General:  Appears disheveled and chronically ill; she has scattered leaves and foliage detritus along the end of her bed Eyes:  PERRL, EOMI, normal lids, iris ENT:  Very hard of hearing, normal lips & tongue, mmm; poor dentition Neck:  no LAD, masses or thyromegaly Cardiovascular: Mild tachycardia, no m/r/g. No LE edema.  Respiratory:   CTA bilaterally with no wheezes/rales/rhonchi.  Normal respiratory effort. Abdomen:  soft, NT, ND, NABS - although she does complain of RLQ TTP associated with rib fracture pain Skin:  no rash or induration seen on limited exam Musculoskeletal:  grossly normal tone BUE/BLE, good ROM, no bony abnormality Psychiatric: eccentric mood and affect, speech fluent but very tangential Neurologic:  CN 2-12 grossly intact, moves all extremities in coordinated fashion, sensation  intact    Radiological Exams on Admission: Dg Chest Port 1 View  Result Date: 01/04/2018 CLINICAL DATA:  Patient states she has right rib fractures from a fall last week. EXAM: PORTABLE CHEST 1 VIEW COMPARISON:  December 29, 2017 FINDINGS: The patient has known rib fractures are not as well visualized on this portable chest x-ray. The heart, hila, mediastinum, lungs, and pleura are unremarkable. No pneumothorax. IMPRESSION: The known right eighth rib fracture is not as well appreciated on this study. No acute abnormality. No pneumothorax. Electronically Signed   By: Dorise Bullion III M.D   On: 01/04/2018 14:42    EKG: Independently reviewed.  NSR with rate 92; no evidence of acute ischemia   Labs on Admission: I have personally reviewed the available labs and imaging studies at the time of the admission.  Pertinent labs:   AST 74, CMP otherwise unremarkable NH4 44 Platelets 132, CBC otherwise unremarkable INR 0.93 ETOH 26  Assessment/Plan Principal Problem:   Alcohol withdrawal (HCC) Active Problems:   Osteoporosis   Alcohol use disorder, severe, dependence ([place])   Essential hypertension   Rib fracture   ETOH dependence with withdrawal -Patient with chronic ETOH dependence -Has failed rehab multiple times -She is at high risk for complications of withdrawal including seizures, DTs -Will observe -CIWA protocol - given her high risk for complications of withdrawal, she will be monitored in the SDU -If her symptoms are unable to be controlled with standing 5 mg Valium q6h and prn CIWA scale Ativan, she may require PCCM  consultation for consideration of Precedex -SW consult for possible inpatient treatment -Elevated LFTs are likely related to alcoholism -Mild thrombocytopenia is likely related to bone marrow suppression from alcohol abuse -Monitor daily platelet count   HTN -Continue Propranolol  Rib fracture -Fall on 10/22 with right 8-9 posterolateral minimally  displaced acute rib fractures -11/1 CXR with improvement in 9th rib fracture, unchanged appearance of 8th rib fracture -CXR today with apparent improvement in 8th rib fracture and resolution of 9th -Continue Lidoderm patch as needed for pain  Osteoporosis -Patient is reportedly taking HRT for osteoporosis prevention -The USPSTF does not recommend use of HRT for primary prevention of chronic conditions due to the substantial increase of risks of harms and so this medication is being held -Suggest ongoing use of Fosamax   DVT prophylaxis: Lovenox - if platelets fall to <100, will need to stop this medication Code Status:  Full  Family Communication: None present  Disposition Plan:  Home once clinically improved Consults called: SW  Admission status: It is my clinical opinion that referral for OBSERVATION is reasonable and necessary in this patient based on the above information provided. The aforementioned taken together are felt to place the patient at high risk for further clinical deterioration. However it is anticipated that the patient may be medically stable for discharge from the hospital within 24 to 48 hours.     Karmen Bongo MD [facility]  If note is complete, please contact covering daytime or nighttime physician. [email or web address] Password [email or web address]  01/04/2018, 4:59 PM

## 2018-01-05 ENCOUNTER — Encounter (HOSPITAL_COMMUNITY): Payer: Self-pay | Admitting: *Deleted

## 2018-01-05 DIAGNOSIS — Z79899 Other long term (current) drug therapy: Secondary | ICD-10-CM | POA: Diagnosis not present

## 2018-01-05 DIAGNOSIS — K709 Alcoholic liver disease, unspecified: Secondary | ICD-10-CM | POA: Diagnosis present

## 2018-01-05 DIAGNOSIS — F10230 Alcohol dependence with withdrawal, uncomplicated: Secondary | ICD-10-CM

## 2018-01-05 DIAGNOSIS — D6959 Other secondary thrombocytopenia: Secondary | ICD-10-CM | POA: Diagnosis present

## 2018-01-05 DIAGNOSIS — Z885 Allergy status to narcotic agent status: Secondary | ICD-10-CM | POA: Diagnosis not present

## 2018-01-05 DIAGNOSIS — I10 Essential (primary) hypertension: Secondary | ICD-10-CM | POA: Diagnosis present

## 2018-01-05 DIAGNOSIS — M810 Age-related osteoporosis without current pathological fracture: Secondary | ICD-10-CM | POA: Diagnosis present

## 2018-01-05 DIAGNOSIS — F10231 Alcohol dependence with withdrawal delirium: Secondary | ICD-10-CM | POA: Diagnosis present

## 2018-01-05 DIAGNOSIS — Z781 Physical restraint status: Secondary | ICD-10-CM | POA: Diagnosis not present

## 2018-01-05 DIAGNOSIS — Z7983 Long term (current) use of bisphosphonates: Secondary | ICD-10-CM | POA: Diagnosis not present

## 2018-01-05 DIAGNOSIS — W109XXA Fall (on) (from) unspecified stairs and steps, initial encounter: Secondary | ICD-10-CM | POA: Diagnosis present

## 2018-01-05 DIAGNOSIS — Z888 Allergy status to other drugs, medicaments and biological substances status: Secondary | ICD-10-CM | POA: Diagnosis not present

## 2018-01-05 DIAGNOSIS — S2241XA Multiple fractures of ribs, right side, initial encounter for closed fracture: Secondary | ICD-10-CM | POA: Diagnosis present

## 2018-01-05 DIAGNOSIS — F1020 Alcohol dependence, uncomplicated: Secondary | ICD-10-CM | POA: Diagnosis not present

## 2018-01-05 DIAGNOSIS — F419 Anxiety disorder, unspecified: Secondary | ICD-10-CM | POA: Diagnosis present

## 2018-01-05 DIAGNOSIS — Z9181 History of falling: Secondary | ICD-10-CM | POA: Diagnosis not present

## 2018-01-05 DIAGNOSIS — E785 Hyperlipidemia, unspecified: Secondary | ICD-10-CM | POA: Diagnosis present

## 2018-01-05 DIAGNOSIS — Z8262 Family history of osteoporosis: Secondary | ICD-10-CM | POA: Diagnosis not present

## 2018-01-05 DIAGNOSIS — E876 Hypokalemia: Secondary | ICD-10-CM | POA: Diagnosis not present

## 2018-01-05 LAB — BASIC METABOLIC PANEL
Anion gap: 8 (ref 5–15)
BUN: 6 mg/dL — AB (ref 8–23)
CHLORIDE: 104 mmol/L (ref 98–111)
CO2: 25 mmol/L (ref 22–32)
CREATININE: 0.81 mg/dL (ref 0.44–1.00)
Calcium: 8.2 mg/dL — ABNORMAL LOW (ref 8.9–10.3)
GFR calc Af Amer: >60 mL/min (ref >60)
GFR calc non Af Amer: >60 mL/min (ref >60)
Glucose, Bld: 85 mg/dL (ref 70–99)
Potassium: 3.2 mmol/L — ABNORMAL LOW (ref 3.5–5.1)
Sodium: 137 mmol/L (ref 135–145)

## 2018-01-05 LAB — CBC
HCT: 33.9 % — ABNORMAL LOW (ref 36.0–46.0)
Hemoglobin: 11.2 g/dL — ABNORMAL LOW (ref 12.0–15.0)
MCH: 33.3 pg (ref 26.0–34.0)
MCHC: 33 g/dL (ref 30.0–36.0)
MCV: 100.9 fL — ABNORMAL HIGH (ref 80.0–100.0)
Platelets: 126 10*3/uL — ABNORMAL LOW (ref 150–400)
RBC: 3.36 MIL/uL — AB (ref 3.87–5.11)
RDW: 12.2 % (ref 11.5–15.5)
WBC: 5.4 10*3/uL (ref 4.0–10.5)
nRBC: 0 % (ref 0.0–0.2)

## 2018-01-05 LAB — MAGNESIUM: MAGNESIUM: 1.5 mg/dL — AB (ref 1.7–2.4)

## 2018-01-05 MED ORDER — LORAZEPAM 2 MG/ML IJ SOLN
1.0000 mg | Freq: Once | INTRAMUSCULAR | Status: AC
  Administered 2018-01-05: 1 mg via INTRAVENOUS
  Filled 2018-01-05: qty 1

## 2018-01-05 MED ORDER — MAGNESIUM SULFATE 50 % IJ SOLN
3.0000 g | Freq: Once | INTRAVENOUS | Status: AC
  Administered 2018-01-05: 3 g via INTRAVENOUS
  Filled 2018-01-05: qty 6

## 2018-01-05 MED ORDER — POTASSIUM CHLORIDE CRYS ER 20 MEQ PO TBCR
40.0000 meq | EXTENDED_RELEASE_TABLET | Freq: Once | ORAL | Status: AC
  Administered 2018-01-05: 40 meq via ORAL
  Filled 2018-01-05: qty 2

## 2018-01-05 MED ORDER — LORAZEPAM 2 MG/ML IJ SOLN
2.0000 mg | Freq: Once | INTRAMUSCULAR | Status: AC
  Administered 2018-01-05: 2 mg via INTRAVENOUS
  Filled 2018-01-05: qty 1

## 2018-01-05 NOTE — Evaluation (Signed)
Physical Therapy Evaluation Patient Details Name: Terri Ayala MRN: [id number] DOB: 31-Jul-1955 Today's Date: 01/05/2018   History of Present Illness  Pt is a 62 y.o. F with significant PMH for alcohol abuse, SVT, fx ribs and recent falls presenting to ED with reports of alcohol withdrawal symptoms. Pt has recent admissions to ED on 12/19/2017 for fx of R ribs 8 and 9, and on 01/01/2018 for a fall onto her knees.   Clinical Impression  Pt admitted with above diagnosis. Pt currently with functional limitations due to the deficits listed below (see PT Problem List). Prior to admission, pt states to be independent with all functional activities. Pt presented min A for supine to sit, but performed supervision level sit to supine. Pt demonstrated L sided inattention with continued running  into objects on her left side while displaying decreased safety awareness and problem solving. Pt denies any visual changes. Pt educated on how to decrease falls in the future. Pt will benefit from skilled PT to increase their independence and safety with mobility to allow discharge to the venue listed below.      Follow Up Recommendations Home health PT    Equipment Recommendations  None recommended by PT    Recommendations for Other Services       Precautions / Restrictions Precautions Precautions: Fall, (Seizure) Restrictions Weight Bearing Restrictions: No      Mobility  Bed Mobility Overal bed mobility: Needs Assistance Bed Mobility: Rolling;Supine to Sit;Sit to Supine Rolling: Min assist   Supine to sit: Min assist Sit to supine: Supervision   General bed mobility comments: Pt able to complete supine to sit and rolling with min A for trunk elevation and stability. Pt needs verbal and tactile cueing in order to facilitate swinging legs off the side of bed. Pt only requires verbal cueing and supervision during sit to supine with HOB flat.     Transfers Overall transfer level: Needs  assistance Equipment used: Rolling walker (2 wheeled) Transfers: Sit to/from Stand Sit to Stand: Min assist         General transfer comment: Pt requires min A for sit to stand for initial power movement and stability while in standing.  Verbal cueing given for hand placement on bed and tactile cueing for trunk elevation.     Ambulation/Gait Ambulation/Gait assistance: Min guard Gait Distance (Feet): 175 Feet Assistive device: Rolling walker (2 wheeled) Gait Pattern/deviations: Decreased step length - right;Decreased step length - left;Decreased dorsiflexion - left;Decreased dorsiflexion - right;Drifts right/left   Gait velocity interpretation: <1.8 ft/sec, indicate of risk for recurrent falls General Gait Details: Pt required min guard for safety during ambulation. Pt demonstrated poor safety awareness and understanding of objects in space. Pt consistently drifted towards the left during ambulation, and ran into several objects without noticing their presence. Pt demonstrated decreased stride length and dorsiflexion bilaterally. max HR during gait was 115 BPM.  Stairs            Wheelchair Mobility    Modified Rankin (Stroke Patients Only)       Balance Overall balance assessment: Needs assistance Sitting-balance support: Single extremity supported;Feet supported Sitting balance-Leahy Scale: Poor Sitting balance - Comments: Pt requires one UE support on bed to maintain sitting balance. Initially, trunk support in elevation with min A given secondary to feeling of dizziness. Pt stated that dizziness decreased after ~1 minute. Pt HR in sitting measured at 97 BPM.   Standing balance support: Bilateral upper extremity supported Standing balance-Leahy Scale: Poor Standing  balance comment: Pt depends on use of BUE for standing balance.                             Pertinent Vitals/Pain Pain Assessment: Faces Faces Pain Scale: Hurts a little bit Pain Location:  general body  Pain Descriptors / Indicators: Discomfort Pain Intervention(s): Monitored during session    Home Living Family/patient expects to be discharged to:: Private residence Living Arrangements: Alone   Type of Home: House Home Access: Stairs to enter Entrance Stairs-Rails: Right Entrance Stairs-Number of Steps: 3 Home Layout: Two level;Able to live on main level with bedroom/bathroom Home Equipment: Gilford Rile - 2 wheels;Cane - single point      Prior Function Level of Independence: Independent         Comments: History of falls/drinking      Hand Dominance        Extremity/Trunk Assessment   Upper Extremity Assessment Upper Extremity Assessment: Generalized weakness    Lower Extremity Assessment Lower Extremity Assessment: Generalized weakness       Communication   Communication: HOH  Cognition Arousal/Alertness: Awake/alert Behavior During Therapy: WFL for tasks assessed/performed Overall Cognitive Status: Impaired/Different from baseline Area of Impairment: Orientation;Attention;Safety/judgement;Problem solving;Awareness;Following commands                 Orientation Level: Disoriented to;Time(Does not know date) Current Attention Level: Sustained   Following Commands: Follows one step commands with increased time;Follows one step commands inconsistently Safety/Judgement: Decreased awareness of safety;Decreased awareness of deficits Awareness: Emergent Problem Solving: Slow processing;Decreased initiation;Difficulty sequencing;Requires verbal cues;Requires tactile cues General Comments: Pt shows ability to follow 1 step commands ~75 % of the time. Pt demonstrates decreased safety awareness during ambulation, and decreased problem solving of objects in her path during gait.        General Comments      Exercises     Assessment/Plan    PT Assessment Patient needs continued PT services  PT Problem List Decreased strength;Decreased range of  motion;Decreased activity tolerance;Decreased balance;Decreased mobility;Decreased coordination;Decreased cognition;Decreased knowledge of use of DME;Decreased safety awareness       PT Treatment Interventions DME instruction;Gait training;Stair training;Functional mobility training;Therapeutic exercise;Therapeutic activities;Balance training;Neuromuscular re-education    PT Goals (Current goals can be found in the Care Plan section)  Acute Rehab PT Goals Patient Stated Goal: To return home  PT Goal Formulation: With patient Time For Goal Achievement: 01/19/18 Potential to Achieve Goals: Fair    Frequency Min 3X/week   Barriers to discharge Other (comment)(Pt lives alone and has ongoing history of alcohol abuse  )      Co-evaluation               AM-PAC PT "6 Clicks" Daily Activity  Outcome Measure Difficulty turning over in bed (including adjusting bedclothes, sheets and blankets)?: A Lot Difficulty moving from lying on back to sitting on the side of the bed? : A Lot Difficulty sitting down on and standing up from a chair with arms (e.g., wheelchair, bedside commode, etc,.)?: A Lot Help needed moving to and from a bed to chair (including a wheelchair)?: A Little Help needed walking in hospital room?: A Little Help needed climbing 3-5 steps with a railing? : A Lot 6 Click Score: 14    End of Session Equipment Utilized During Treatment: Gait belt Activity Tolerance: Patient limited by fatigue Patient left: in bed;with call bell/phone within reach;with bed alarm set;with nursing/sitter in room Nurse Communication: Mobility  status PT Visit Diagnosis: Unsteadiness on feet (R26.81);Other abnormalities of gait and mobility (R26.89);Muscle weakness (generalized) (M62.81);Repeated falls (R29.6)    Time: 2341-4436 PT Time Calculation (min) (ACUTE ONLY): 41 min   Charges:   PT Evaluation $PT Eval Moderate Complexity: 1 Mod PT Treatments $Gait Training: 23-37 mins         Wandra Feinstein, SPT Acute Rehab [phone] (pager) [phone] (office)  Minal Stuller 01/05/2018, 2:49 PM

## 2018-01-05 NOTE — Progress Notes (Signed)
[facility]                                                                              Patient Demographics  Terri Ayala, is a 62 y.o. female, DOB - Jul 21, 1955, YIF:[id number]  Admit date - 01/04/2018   Admitting Physician Karmen Bongo, MD  Outpatient Primary MD for the patient is Patient, No Pcp Per  Outpatient specialists:   LOS - 0  days   Medical records reviewed and are as summarized below:    Chief Complaint  Patient presents with  . Alcohol Intoxication       Brief summary   62 year old female with history of alcohol abuse, hyperlipidemia presented for alcohol detox and after a fall.  Patient reported that she fell on the stairs and had difficulty getting up.  She reported that she was trying to get up stairs for the first time since she broke her ribs, last drink was at night before the admission on 11/6.    Assessment & Plan    Principal Problem: Alcohol dependence with withdrawals (HCC) -Chronic alcohol dependence and has failed to rehab multiple times with high risk of seizures and DTs -Still very tremulous, continue Valium scheduled with Ativan as needed with CIWA protocol -Social work consulted for possible inpatient treatment -Continue thiamine, folate, gabapentin  Active Problems: Hypertension Continue propranolol  Fall with rib fractures Patient had a fall on 10/22, had a right 8, 9 posterior lateral minimally displaced acute rib fractures -Continue Lidoderm patch, incentive spirometry, pain control     Osteoporosis -Continue Fosamax once a week  Hypokalemia with hypomagnesemia Replaced potassium and magnesium supplements  Elevated AST with thrombocytopenia Likely due to alcoholic liver disease Monitor platelets closely with Lovenox  Code Status: Full code DVT Prophylaxis: Lovenox Family Communication: Discussed in detail with the patient, all imaging results, lab results explained to the patient    Disposition Plan:  Still in acute alcohol withdrawal  Time Spent in minutes   25  Procedures:  None  Consultants:   None  Antimicrobials:      Medications  Scheduled Meds: . busPIRone  15 mg Oral TID  . diazepam  5 mg Oral Q6H  . docusate sodium  100 mg Oral BID  . DULoxetine  60 mg Oral Daily  . enoxaparin (LOVENOX) injection  40 mg Subcutaneous Q24H  . folic acid  1 mg Oral Daily  . gabapentin  300 mg Oral BID  . lidocaine  1 patch Transdermal Q24H  . multivitamin with minerals  1 tablet Oral Daily  . pantoprazole  40 mg Oral Q1200  . potassium chloride  40 mEq Oral Once  . propranolol  20 mg Oral BID  . sodium chloride flush  3 mL Intravenous Q12H  . thiamine  100 mg Oral Daily  . traZODone  50 mg Oral QHS   Continuous Infusions: . lactated ringers 100 mL/hr at 01/04/18 2126  . magnesium sulfate LVP 250-500 ml     PRN Meds:.acetaminophen **OR** acetaminophen, LORazepam, ondansetron **OR** ondansetron (ZOFRAN) IV   Antibiotics   Anti-infectives (From admission, onward)   None  Subjective:   Terri Ayala was seen and examined today.  Anxious and still tremulous, no nausea or vomiting.  Patient denies dizziness, chest pain, shortness of breath, abdominal pain, N/V/D/C.  Objective:   Vitals:   01/05/18 0015 01/05/18 0402 01/05/18 0740 01/05/18 1137  BP: 131/78 (!) 143/84 (!) 146/87 (!) 144/81  Pulse: 87 88 98 85  Resp: 20 18 16 16   Temp: 98.7 F (37.1 C) 98.3 F (36.8 C) 98.3 F (36.8 C) 98.5 F (36.9 C)  TempSrc: Oral Oral Oral Oral  SpO2: 98% 98% 97% 97%  Weight:      Height:        Intake/Output Summary (Last 24 hours) at 01/05/2018 1145 Last data filed at 01/05/2018 0800 Gross per 24 hour  Intake 2575.7 ml  Output 550 ml  Net 2025.7 ml     Wt Readings from Last 3 Encounters:  01/04/18 68 kg  12/10/17 62.6 kg  09/29/17 59 kg     Exam  General: Alert and oriented x 3, NAD  Eyes:   HEENT:  Atraumatic, normocephalic  Cardiovascular: S1 S2  auscultated, Regular rate and rhythm.  Respiratory: Clear to auscultation bilaterally, no wheezing, rales or rhonchi  Gastrointestinal: Soft, nontender, nondistended, + bowel sounds  Ext: no pedal edema bilaterally  Neuro: still tremulous  Musculoskeletal: No digital cyanosis, clubbing  Skin: No rashes  Psych: anxious, alert and oriented x3    Data Reviewed:  I have personally reviewed following labs and imaging studies  Micro Results No results found for this or any previous visit (from the past 240 hour(s)).  Radiology Reports Dg Ribs Unilateral W/chest Right  Result Date: 12/29/2017 CLINICAL DATA:  Right rib pain after fall. EXAM: RIGHT RIBS AND CHEST - 3+ VIEW COMPARISON:  Right rib radiographs 12/19/2017 FINDINGS: Unchanged appearance of fracture of the posterolateral right eighth rib. The ninth rib fracture is less clearly visualized on the current study. No other rib fracture visualized, specifically at the marked location of pain. The marked location is slightly inferior to the ribcage. Lungs are clear. No pleural effusion or pneumothorax. IMPRESSION: Unchanged appearance of right eighth rib fracture. Previously demonstrated right ninth rib fracture is less well visualized today. This may be projectional. No other fractures. Electronically Signed   By: Ulyses Jarred M.D.   On: 12/29/2017 16:35   Dg Ribs Unilateral W/chest Right  Result Date: 12/19/2017 CLINICAL DATA:  62 y/o F; fall with pain to the posterior and lateral right ribs. EXAM: RIGHT RIBS AND CHEST - 3+ VIEW COMPARISON:  12/08/2017 chest radiograph. FINDINGS: Right 8 and 9 posterolateral minimally displaced acute rib fractures. Clear lungs. Stable cardiac silhouette. No pleural effusion or pneumothorax. Cholecystectomy clips in the right upper quadrant. IMPRESSION: Right 8 and 9 posterolateral minimally displaced acute rib fractures. No pneumothorax. Electronically Signed   By: Kristine Garbe M.D.   On:  12/19/2017 16:20   Dg Chest Port 1 View  Result Date: 01/04/2018 CLINICAL DATA:  Patient states she has right rib fractures from a fall last week. EXAM: PORTABLE CHEST 1 VIEW COMPARISON:  December 29, 2017 FINDINGS: The patient has known rib fractures are not as well visualized on this portable chest x-ray. The heart, hila, mediastinum, lungs, and pleura are unremarkable. No pneumothorax. IMPRESSION: The known right eighth rib fracture is not as well appreciated on this study. No acute abnormality. No pneumothorax. Electronically Signed   By: Dorise Bullion III M.D   On: 01/04/2018 14:42   Dg Chest Port 1  View  Result Date: 12/08/2017 CLINICAL DATA:  Chest pain and alcohol withdrawal. EXAM: PORTABLE CHEST 1 VIEW COMPARISON:  11/06/2017 FINDINGS: The heart size and mediastinal contours are within normal limits. Both lungs are clear. The visualized skeletal structures are unremarkable. IMPRESSION: No active disease. Electronically Signed   By: Ulyses Jarred M.D.   On: 12/08/2017 04:00    Lab Data:  CBC: Recent Labs  Lab 01/04/18 1428 01/05/18 0401  WBC 8.2 5.4  NEUTROABS 6.8  --   HGB 12.6 11.2*  HCT 39.3 33.9*  MCV 103.4* 100.9*  PLT 132* 646*   Basic Metabolic Panel: Recent Labs  Lab 01/04/18 1428 01/05/18 0401  NA 138 137  K 4.5 3.2*  CL 102 104  CO2 23 25  GLUCOSE 99 85  BUN <5* 6*  CREATININE 0.66 0.81  CALCIUM 8.8* 8.2*  MG  --  1.5*   GFR: Estimated Creatinine Clearance: 64.3 mL/min (by C-G formula based on SCr of 0.81 mg/dL). Liver Function Tests: Recent Labs  Lab 01/04/18 1428  AST 74*  ALT 16  ALKPHOS 86  BILITOT 0.9  PROT 7.4  ALBUMIN 3.9   Recent Labs  Lab 01/04/18 1428  LIPASE 42   Recent Labs  Lab 01/04/18 1425  AMMONIA 44*   Coagulation Profile: Recent Labs  Lab 01/04/18 1428  INR 0.93   Cardiac Enzymes: No results for input(s): CKTOTAL, CKMB, CKMBINDEX, TROPONINI in the last 168 hours. BNP (last 3 results) No results for input(s):  PROBNP in the last 8760 hours. HbA1C: No results for input(s): HGBA1C in the last 72 hours. CBG: No results for input(s): GLUCAP in the last 168 hours. Lipid Profile: No results for input(s): CHOL, HDL, LDLCALC, TRIG, CHOLHDL, LDLDIRECT in the last 72 hours. Thyroid Function Tests: No results for input(s): TSH, T4TOTAL, FREET4, T3FREE, THYROIDAB in the last 72 hours. Anemia Panel: No results for input(s): VITAMINB12, FOLATE, FERRITIN, TIBC, IRON, RETICCTPCT in the last 72 hours. Urine analysis:    Component Value Date/Time   COLORURINE YELLOW 01/04/2018 1842   APPEARANCEUR CLEAR 01/04/2018 1842   LABSPEC 1.010 01/04/2018 1842   LABSPEC 1.030 09/01/2017 1443   PHURINE 7.0 01/04/2018 1842   GLUCOSEU NEGATIVE 01/04/2018 1842   HGBUR SMALL (A) 01/04/2018 1842   BILIRUBINUR NEGATIVE 01/04/2018 1842   BILIRUBINUR moderate (A) 09/01/2017 1443   KETONESUR 5 (A) 01/04/2018 1842   PROTEINUR NEGATIVE 01/04/2018 1842   UROBILINOGEN 0.2 07/25/2007 0900   NITRITE NEGATIVE 01/04/2018 1842   LEUKOCYTESUR NEGATIVE 01/04/2018 1842     Fiore Detjen M.D. [facility] 01/05/2018, 11:45 AM  Pager: [phone] Between 7am to 7pm - call Pager - [phone]  After 7pm go to [email or web address] - password [email or web address]  Call night coverage person covering after 7pm

## 2018-01-05 NOTE — Clinical Social Work Note (Signed)
Clinical Social Work Assessment  Patient Details  Name: Terri Ayala MRN: [id number] Date of Birth: Jun 19, 1955  Date of referral:  01/05/18               Reason for consult:  Substance Use/ETOH Abuse                Permission sought to share information with:    Permission granted to share information::  No  Name::        Agency::     Relationship::     Contact Information:     Housing/Transportation Living arrangements for the past 2 months:  Single Family Home Source of Information:  Patient, Medical Team Patient Interpreter Needed:  None Criminal Activity/Legal Involvement Pertinent to Current Situation/Hospitalization:  No - Comment as needed Significant Relationships:  Parents, Friend Lives with:    Do you feel safe going back to the place where you live?  Yes Need for family participation in patient care:  Yes (Comment)  Care giving concerns:  Substance abuse.   Social Worker assessment / plan:  CSW met with patient. No supports at bedside. CSW introduced role and inquired about interest in substance abuse resources. CSW gave resources to patient last admission as well. Patient is agreeable to receiving. Patient stated she has been to a rehab close to the hospital about 4 times, goes to [place], and most recently went to [facility]. She was there for 28 days, sober for 15 months, then relapsed. She stopped following up with them outpatient earlier this year. Patient will review options and follow up with facilities. Patient also stated she does not have a PCP. She typically follows up at urgent care. Patient stated she missed one appt with her PCP and they dismissed her. RNCM has been notified. No further concerns. CSW signing off as social work intervention is no longer needed.  Employment status:  [employer] information:  Other (Comment Required)(BCBS) PT Recommendations:  Home with [place] / Referral to community resources:  Outpatient Substance  Abuse Treatment Options, Residential Substance Abuse Treatment Options  Patient/Family's Response to care:  Patient agreeable to resources. Patient's mother supportive and involved in patient's care. Patient appreciated social work intervention.  Patient/Family's Understanding of and Emotional Response to Diagnosis, Current Treatment, and Prognosis:  Patient has a good understanding of the reason for admission and social work consult. Patient appears happy with hospital care.  Emotional Assessment Appearance:  Appears stated age Attitude/Demeanor/Rapport:  Engaged, Gracious Affect (typically observed):  Accepting, Appropriate, Calm, Pleasant Orientation:  Oriented to Self, Oriented to Place, Oriented to  Time, Oriented to Situation Alcohol / Substance use:  Alcohol Use Psych involvement (Current and /or in the community):  No (Comment)  Discharge Needs  Concerns to be addressed:  Care Coordination Readmission within the last 30 days:  Yes Current discharge risk:  Substance Abuse Barriers to Discharge:  Continued Medical Work up   Candie Chroman, LCSW 01/05/2018, 2:44 PM

## 2018-01-05 NOTE — Progress Notes (Signed)
RN went to check on pt and found pt attempting to get out of bed and tangled in her telemetry wires.Pt stated that she needed to go to her room and that was she in the wrong room. Pt also has visile hand tremors. Pt was re-oriented and repositioned in the bed. Pt also given Ativan based on her CIWA score. Will continue to monitor.

## 2018-01-05 NOTE — Progress Notes (Signed)
Pt continues to see people sitting in her recliner and has attempted to crawl out of bed several of times and once requiring to nurses to assist her back in bed. PT has now recieved 4 doses of ativan and 2 doses of valium and she continues to hallucinate, cold sweats, hand tremors, tachycardia. MD page notified and restraints were ordered for pt's safety, along with a telesitter.Will continue to monitor.

## 2018-01-05 NOTE — Care Management Note (Signed)
Case Management Note  Patient Details  Name: Terri Ayala MRN: [id number] Date of Birth: Jan 25, 1956  Subjective/Objective:  Pt in with alcohol withdrawl. She is from home alone.      Per CSW pt lost PCP and needs new one.    Pt uses [facility].             Action/Plan: PT recommending [place] services. CM following for d/c needs and physician orders.    Expected Discharge Date:                  Expected Discharge Plan:     In-House Referral:     Discharge planning Services     Post Acute Care Choice:    Choice offered to:     DME Arranged:    DME Agency:     HH Arranged:    HH Agency:     Status of Service:  In process, will continue to follow  If discussed at Long Length of Stay Meetings, dates discussed:    Additional Comments:  Pollie Friar, RN 01/05/2018, 4:16 PM

## 2018-01-05 NOTE — Progress Notes (Signed)
Bedside assessment done at the request of Dr. Tana Coast for concerns regarding worseining CIWA scores. On assessment, pt has a Corporate investment banker with restraints. She is alert but disoriented, denies pain/discomfort.Pt mildly tachycardiac and hypertensive but VSS otherwise stable. Will continue with current CIWA protocol.   Alcohol dependence with withdrawals ([place]) -CIWA currently at 7. Will continue to monitor and treat with Ativan PRN. - Continue with restraints and telesitter at this time.     Lovey Newcomer, NP [facility] 7p-7a [phone]

## 2018-01-06 LAB — CBC
HCT: 38.9 % (ref 36.0–46.0)
Hemoglobin: 12.8 g/dL (ref 12.0–15.0)
MCH: 33.2 pg (ref 26.0–34.0)
MCHC: 32.9 g/dL (ref 30.0–36.0)
MCV: 100.8 fL — ABNORMAL HIGH (ref 80.0–100.0)
NRBC: 0 % (ref 0.0–0.2)
PLATELETS: 134 10*3/uL — AB (ref 150–400)
RBC: 3.86 MIL/uL — ABNORMAL LOW (ref 3.87–5.11)
RDW: 12.1 % (ref 11.5–15.5)
WBC: 5.9 10*3/uL (ref 4.0–10.5)

## 2018-01-06 LAB — BASIC METABOLIC PANEL
Anion gap: 12 (ref 5–15)
BUN: 5 mg/dL — ABNORMAL LOW (ref 8–23)
CALCIUM: 8.8 mg/dL — AB (ref 8.9–10.3)
CO2: 24 mmol/L (ref 22–32)
Chloride: 99 mmol/L (ref 98–111)
Creatinine, Ser: 0.85 mg/dL (ref 0.44–1.00)
GFR calc non Af Amer: >60 mL/min (ref >60)
Glucose, Bld: 102 mg/dL — ABNORMAL HIGH (ref 70–99)
POTASSIUM: 3.3 mmol/L — AB (ref 3.5–5.1)
SODIUM: 135 mmol/L (ref 135–145)

## 2018-01-06 LAB — MAGNESIUM: Magnesium: 2.3 mg/dL (ref 1.7–2.4)

## 2018-01-06 MED ORDER — POTASSIUM CHLORIDE 10 MEQ/100ML IV SOLN
10.0000 meq | INTRAVENOUS | Status: AC
  Administered 2018-01-06 (×2): 10 meq via INTRAVENOUS
  Filled 2018-01-06 (×2): qty 100

## 2018-01-06 NOTE — Progress Notes (Signed)
Patient did not void this shift, bladder scanned 590 ml.  Encouraged patient to wake up fully, readjusted in bed and turned water on, provided privacy and patient able to void into external catheter 600 ml.  Wrists restraints off at this time.  Patient able and willing to eat dinner.  Bed alarm active and audible, seizure pads/precautions continue.  Will monitor.

## 2018-01-06 NOTE — Progress Notes (Signed)
Notified MD of CIWA 9

## 2018-01-06 NOTE — Progress Notes (Addendum)
[facility]                                                                              Patient Demographics  Terri Ayala, is a 62 y.o. female, DOB - 10/08/1955, [id number]  Admit date - 01/04/2018   Admitting Physician Karmen Bongo, MD  Outpatient Primary MD for the patient is Patient, No Pcp Per  Outpatient specialists:   LOS - 1  days   Medical records reviewed and are as summarized below:    Chief Complaint  Patient presents with  . Alcohol Intoxication       Brief summary   62 year old female with history of alcohol abuse, hyperlipidemia presented for alcohol detox and after a fall.  Patient reported that she fell on the stairs and had difficulty getting up.  She reported that she was trying to get up stairs for the first time since she broke her ribs, last drink was at night before the admission on 11/6.    Assessment & Plan    Principal Problem: Alcohol dependence with withdrawals and delirium (HCC) -Chronic alcohol dependence and has failed to rehab multiple times with high risk of seizures and DTs -Continue scheduled Valium with IV Ativan CIWA protocol, patient required multiple doses of IV Ativan yesterday with sitter and restraints due to DTs. -Currently snoring but arousable.  Monitor closely, low threshold of transferring to ICU for Precedex if worsens.  Active Problems: Hypertension BP currently stable, continue propranolol   Fall with rib fractures Patient had a fall on 10/22, had a right 8, 9 posterior lateral minimally displaced acute rib fractures -Continue Lidoderm patch, incentive spirometry, pain control     Osteoporosis -Continue Fosamax once a week  Hypokalemia with hypomagnesemia Potassium replaced.  Magnesium now within normal limits  Elevated AST with thrombocytopenia Likely due to alcoholic liver disease Platelets stable  Code Status: Full code DVT Prophylaxis: Lovenox Family Communication: No family member  at the bedside   Disposition Plan: Still in acute alcohol withdrawal  Time Spent in minutes   25  Procedures:  None  Consultants:   None  Antimicrobials:      Medications  Scheduled Meds: . busPIRone  15 mg Oral TID  . diazepam  5 mg Oral Q6H  . docusate sodium  100 mg Oral BID  . DULoxetine  60 mg Oral Daily  . enoxaparin (LOVENOX) injection  40 mg Subcutaneous Q24H  . folic acid  1 mg Oral Daily  . gabapentin  300 mg Oral BID  . lidocaine  1 patch Transdermal Q24H  . multivitamin with minerals  1 tablet Oral Daily  . pantoprazole  40 mg Oral Q1200  . propranolol  20 mg Oral BID  . sodium chloride flush  3 mL Intravenous Q12H  . thiamine  100 mg Oral Daily  . traZODone  50 mg Oral QHS   Continuous Infusions: . lactated ringers 100 mL/hr at 01/04/18 2126   PRN Meds:.acetaminophen **OR** acetaminophen, LORazepam, ondansetron **OR** ondansetron (ZOFRAN) IV   Antibiotics   Anti-infectives (From admission, onward)   None        Subjective:   Eston Esters  was seen and examined today.  Sleepy but easily arousable, overnight issues noted.  No fevers or chills, vomiting  Objective:   Vitals:   01/06/18 0314 01/06/18 0400 01/06/18 0615 01/06/18 0707  BP:  (!) 153/106 (!) 155/97 (!) 137/94  Pulse:  (!) 112 96 92  Resp:  (!) 21 (!) 21 (!) 23  Temp: 98.8 F (37.1 C) 98.2 F (36.8 C)  98.9 F (37.2 C)  TempSrc: Axillary Oral  Axillary  SpO2:  98% 100% 100%  Weight:      Height:        Intake/Output Summary (Last 24 hours) at 01/06/2018 1059 Last data filed at 01/06/2018 0300 Gross per 24 hour  Intake 184 ml  Output 600 ml  Net -416 ml     Wt Readings from Last 3 Encounters:  01/04/18 68 kg  12/10/17 62.6 kg  09/29/17 59 kg     Exam    General: Somnolent but easily arousable, snoring in between  Eyes:   HEENT:   Cardiovascular: S1 S2, RRR  Respiratory: CTA B anteriorly  Gastrointestinal: Soft, nontender, nondistended, + bowel  sounds  Ext: no pedal edema bilaterally  Neuro: unable to assess, patient somnolent  Musculoskeletal: No digital cyanosis, clubbing  Skin: No rashes  Psych: somnolent    Data Reviewed:  I have personally reviewed following labs and imaging studies  Micro Results No results found for this or any previous visit (from the past 240 hour(s)).  Radiology Reports Dg Ribs Unilateral W/chest Right  Result Date: 12/29/2017 CLINICAL DATA:  Right rib pain after fall. EXAM: RIGHT RIBS AND CHEST - 3+ VIEW COMPARISON:  Right rib radiographs 12/19/2017 FINDINGS: Unchanged appearance of fracture of the posterolateral right eighth rib. The ninth rib fracture is less clearly visualized on the current study. No other rib fracture visualized, specifically at the marked location of pain. The marked location is slightly inferior to the ribcage. Lungs are clear. No pleural effusion or pneumothorax. IMPRESSION: Unchanged appearance of right eighth rib fracture. Previously demonstrated right ninth rib fracture is less well visualized today. This may be projectional. No other fractures. Electronically Signed   By: Ulyses Jarred M.D.   On: 12/29/2017 16:35   Dg Ribs Unilateral W/chest Right  Result Date: 12/19/2017 CLINICAL DATA:  62 y/o F; fall with pain to the posterior and lateral right ribs. EXAM: RIGHT RIBS AND CHEST - 3+ VIEW COMPARISON:  12/08/2017 chest radiograph. FINDINGS: Right 8 and 9 posterolateral minimally displaced acute rib fractures. Clear lungs. Stable cardiac silhouette. No pleural effusion or pneumothorax. Cholecystectomy clips in the right upper quadrant. IMPRESSION: Right 8 and 9 posterolateral minimally displaced acute rib fractures. No pneumothorax. Electronically Signed   By: Kristine Garbe M.D.   On: 12/19/2017 16:20   Dg Chest Port 1 View  Result Date: 01/04/2018 CLINICAL DATA:  Patient states she has right rib fractures from a fall last week. EXAM: PORTABLE CHEST 1 VIEW  COMPARISON:  December 29, 2017 FINDINGS: The patient has known rib fractures are not as well visualized on this portable chest x-ray. The heart, hila, mediastinum, lungs, and pleura are unremarkable. No pneumothorax. IMPRESSION: The known right eighth rib fracture is not as well appreciated on this study. No acute abnormality. No pneumothorax. Electronically Signed   By: Dorise Bullion III M.D   On: 01/04/2018 14:42   Dg Chest Port 1 View  Result Date: 12/08/2017 CLINICAL DATA:  Chest pain and alcohol withdrawal. EXAM: PORTABLE CHEST 1 VIEW COMPARISON:  11/06/2017  FINDINGS: The heart size and mediastinal contours are within normal limits. Both lungs are clear. The visualized skeletal structures are unremarkable. IMPRESSION: No active disease. Electronically Signed   By: Ulyses Jarred M.D.   On: 12/08/2017 04:00    Lab Data:  CBC: Recent Labs  Lab 01/04/18 1428 01/05/18 0401 01/06/18 0500  WBC 8.2 5.4 5.9  NEUTROABS 6.8  --   --   HGB 12.6 11.2* 12.8  HCT 39.3 33.9* 38.9  MCV 103.4* 100.9* 100.8*  PLT 132* 126* 563*   Basic Metabolic Panel: Recent Labs  Lab 01/04/18 1428 01/05/18 0401 01/06/18 0500  NA 138 137 135  K 4.5 3.2* 3.3*  CL 102 104 99  CO2 23 25 24   GLUCOSE 99 85 102*  BUN <5* 6* 5*  CREATININE 0.66 0.81 0.85  CALCIUM 8.8* 8.2* 8.8*  MG  --  1.5* 2.3   GFR: Estimated Creatinine Clearance: 61.3 mL/min (by C-G formula based on SCr of 0.85 mg/dL). Liver Function Tests: Recent Labs  Lab 01/04/18 1428  AST 74*  ALT 16  ALKPHOS 86  BILITOT 0.9  PROT 7.4  ALBUMIN 3.9   Recent Labs  Lab 01/04/18 1428  LIPASE 42   Recent Labs  Lab 01/04/18 1425  AMMONIA 44*   Coagulation Profile: Recent Labs  Lab 01/04/18 1428  INR 0.93   Cardiac Enzymes: No results for input(s): CKTOTAL, CKMB, CKMBINDEX, TROPONINI in the last 168 hours. BNP (last 3 results) No results for input(s): PROBNP in the last 8760 hours. HbA1C: No results for input(s): HGBA1C in the  last 72 hours. CBG: No results for input(s): GLUCAP in the last 168 hours. Lipid Profile: No results for input(s): CHOL, HDL, LDLCALC, TRIG, CHOLHDL, LDLDIRECT in the last 72 hours. Thyroid Function Tests: No results for input(s): TSH, T4TOTAL, FREET4, T3FREE, THYROIDAB in the last 72 hours. Anemia Panel: No results for input(s): VITAMINB12, FOLATE, FERRITIN, TIBC, IRON, RETICCTPCT in the last 72 hours. Urine analysis:    Component Value Date/Time   COLORURINE YELLOW 01/04/2018 1842   APPEARANCEUR CLEAR 01/04/2018 1842   LABSPEC 1.010 01/04/2018 1842   LABSPEC 1.030 09/01/2017 1443   PHURINE 7.0 01/04/2018 1842   GLUCOSEU NEGATIVE 01/04/2018 1842   HGBUR SMALL (A) 01/04/2018 1842   BILIRUBINUR NEGATIVE 01/04/2018 1842   BILIRUBINUR moderate (A) 09/01/2017 1443   KETONESUR 5 (A) 01/04/2018 1842   PROTEINUR NEGATIVE 01/04/2018 1842   UROBILINOGEN 0.2 07/25/2007 0900   NITRITE NEGATIVE 01/04/2018 1842   LEUKOCYTESUR NEGATIVE 01/04/2018 1842     Nakeyia Menden M.D. [facility] 01/06/2018, 10:59 AM  Pager: [phone] Between 7am to 7pm - call Pager - [phone]  After 7pm go to [email or web address] - password [email or web address]  Call night coverage person covering after 7pm

## 2018-01-07 LAB — BASIC METABOLIC PANEL
Anion gap: 10 (ref 5–15)
BUN: 11 mg/dL (ref 8–23)
CALCIUM: 9.3 mg/dL (ref 8.9–10.3)
CHLORIDE: 103 mmol/L (ref 98–111)
CO2: 24 mmol/L (ref 22–32)
CREATININE: 0.92 mg/dL (ref 0.44–1.00)
Glucose, Bld: 101 mg/dL — ABNORMAL HIGH (ref 70–99)
Potassium: 3.4 mmol/L — ABNORMAL LOW (ref 3.5–5.1)
Sodium: 137 mmol/L (ref 135–145)

## 2018-01-07 MED ORDER — SUMATRIPTAN SUCCINATE 100 MG PO TABS
100.0000 mg | ORAL_TABLET | Freq: Four times a day (QID) | ORAL | Status: DC | PRN
  Administered 2018-01-07: 100 mg via ORAL
  Filled 2018-01-07 (×2): qty 1

## 2018-01-07 NOTE — Progress Notes (Signed)
[facility]                                                                              Patient Demographics  Terri Ayala, is a 62 y.o. female, DOB - 1955/11/15, ZJQ:[id number]  Admit date - 01/04/2018   Admitting Physician Karmen Bongo, MD  Outpatient Primary MD for the patient is Patient, No Pcp Per  Outpatient specialists:   LOS - 2  days   Medical records reviewed and are as summarized below:    Chief Complaint  Patient presents with  . Alcohol Intoxication       Brief summary   62 year old female with history of alcohol abuse, hyperlipidemia presented for alcohol detox and after a fall.  Patient reported that she fell on the stairs and had difficulty getting up.  She reported that she was trying to get up stairs for the first time since she broke her ribs, last drink was at night before the admission on 11/6.    Assessment & Plan    Principal Problem: Alcohol dependence with withdrawals and delirium (HCC) -Chronic alcohol dependence and has failed to rehab multiple times with high risk of seizures and DTs -Continue scheduled Valium with IV Ativan CIWA protocol, improving, tremulous but alert and oriented - will discuss with social work tomorrow for any inpatient alcohol rehab   Active Problems: Hypertension Tachycardia likely due to #1, BP stable, continue propranolol  Fall with rib fractures Patient had a fall on 10/22, had a right 8, 9 posterior lateral minimally displaced acute rib fractures -Continue Lidoderm patch, incentive spirometry, pain control     Osteoporosis -Continue Fosamax once a week  Hypokalemia with hypomagnesemia  Magnesium now within normal limits Potassium 3.4, replaced  Elevated AST with thrombocytopenia Likely due to alcoholic liver disease Platelets stable  Code Status: Full code DVT Prophylaxis: Lovenox Family Communication: No family member at the bedside   Disposition Plan: Not stable for discharge,  still in withdrawals  Time Spent in minutes   25  Procedures:  None  Consultants:   None  Antimicrobials:      Medications  Scheduled Meds: . busPIRone  15 mg Oral TID  . diazepam  5 mg Oral Q6H  . docusate sodium  100 mg Oral BID  . DULoxetine  60 mg Oral Daily  . enoxaparin (LOVENOX) injection  40 mg Subcutaneous Q24H  . folic acid  1 mg Oral Daily  . gabapentin  300 mg Oral BID  . lidocaine  1 patch Transdermal Q24H  . multivitamin with minerals  1 tablet Oral Daily  . pantoprazole  40 mg Oral Q1200  . propranolol  20 mg Oral BID  . sodium chloride flush  3 mL Intravenous Q12H  . thiamine  100 mg Oral Daily  . traZODone  50 mg Oral QHS   Continuous Infusions: . lactated ringers Stopped (01/05/18 1806)   PRN Meds:.acetaminophen **OR** acetaminophen, LORazepam, ondansetron **OR** ondansetron (ZOFRAN) IV, SUMAtriptan   Antibiotics   Anti-infectives (From admission, onward)   None        Subjective:   Terri Ayala was seen and examined today.  Much more  alert and awake today however still tremulous, tachycardiac.  No abdominal pain, chest pain, shortness of breath no nausea vomiting.  Objective:   Vitals:   01/07/18 0342 01/07/18 0400 01/07/18 0728 01/07/18 0800  BP: (!) 129/92  (!) 145/90 (!) 145/89  Pulse: 93 93 (!) 105   Resp: 14 (!) 21 14   Temp:   98.9 F (37.2 C)   TempSrc:   Axillary   SpO2: 95% 97% 96%   Weight:      Height:        Intake/Output Summary (Last 24 hours) at 01/07/2018 1225 Last data filed at 01/07/2018 0654 Gross per 24 hour  Intake 981.74 ml  Output 1150 ml  Net -168.26 ml     Wt Readings from Last 3 Encounters:  01/04/18 68 kg  12/10/17 62.6 kg  09/29/17 59 kg     Exam   General: Alert and oriented x 3, NAD Eyes:  HEENT:  Atraumatic, normocephalic Cardiovascular: S1 S2 clear, RRR, tachycardia Respiratory: CTA B  Gastrointestinal: Soft, nontender, nondistended, + bowel sounds Ext: no pedal edema  bilaterally, still tremulous Neuro: no new deficits Musculoskeletal: No digital cyanosis, clubbing Skin: No rashes Psych: Normal affect and demeanor, alert and oriented x3       Data Reviewed:  I have personally reviewed following labs and imaging studies  Micro Results No results found for this or any previous visit (from the past 240 hour(s)).  Radiology Reports Dg Ribs Unilateral W/chest Right  Result Date: 12/29/2017 CLINICAL DATA:  Right rib pain after fall. EXAM: RIGHT RIBS AND CHEST - 3+ VIEW COMPARISON:  Right rib radiographs 12/19/2017 FINDINGS: Unchanged appearance of fracture of the posterolateral right eighth rib. The ninth rib fracture is less clearly visualized on the current study. No other rib fracture visualized, specifically at the marked location of pain. The marked location is slightly inferior to the ribcage. Lungs are clear. No pleural effusion or pneumothorax. IMPRESSION: Unchanged appearance of right eighth rib fracture. Previously demonstrated right ninth rib fracture is less well visualized today. This may be projectional. No other fractures. Electronically Signed   By: Ulyses Jarred M.D.   On: 12/29/2017 16:35   Dg Ribs Unilateral W/chest Right  Result Date: 12/19/2017 CLINICAL DATA:  62 y/o F; fall with pain to the posterior and lateral right ribs. EXAM: RIGHT RIBS AND CHEST - 3+ VIEW COMPARISON:  12/08/2017 chest radiograph. FINDINGS: Right 8 and 9 posterolateral minimally displaced acute rib fractures. Clear lungs. Stable cardiac silhouette. No pleural effusion or pneumothorax. Cholecystectomy clips in the right upper quadrant. IMPRESSION: Right 8 and 9 posterolateral minimally displaced acute rib fractures. No pneumothorax. Electronically Signed   By: Kristine Garbe M.D.   On: 12/19/2017 16:20   Dg Chest Port 1 View  Result Date: 01/04/2018 CLINICAL DATA:  Patient states she has right rib fractures from a fall last week. EXAM: PORTABLE CHEST 1 VIEW  COMPARISON:  December 29, 2017 FINDINGS: The patient has known rib fractures are not as well visualized on this portable chest x-ray. The heart, hila, mediastinum, lungs, and pleura are unremarkable. No pneumothorax. IMPRESSION: The known right eighth rib fracture is not as well appreciated on this study. No acute abnormality. No pneumothorax. Electronically Signed   By: Dorise Bullion III M.D   On: 01/04/2018 14:42    Lab Data:  CBC: Recent Labs  Lab 01/04/18 1428 01/05/18 0401 01/06/18 0500  WBC 8.2 5.4 5.9  NEUTROABS 6.8  --   --  HGB 12.6 11.2* 12.8  HCT 39.3 33.9* 38.9  MCV 103.4* 100.9* 100.8*  PLT 132* 126* 025*   Basic Metabolic Panel: Recent Labs  Lab 01/04/18 1428 01/05/18 0401 01/06/18 0500 01/07/18 0547  NA 138 137 135 137  K 4.5 3.2* 3.3* 3.4*  CL 102 104 99 103  CO2 23 25 24 24   GLUCOSE 99 85 102* 101*  BUN <5* 6* 5* 11  CREATININE 0.66 0.81 0.85 0.92  CALCIUM 8.8* 8.2* 8.8* 9.3  MG  --  1.5* 2.3  --    GFR: Estimated Creatinine Clearance: 56.6 mL/min (by C-G formula based on SCr of 0.92 mg/dL). Liver Function Tests: Recent Labs  Lab 01/04/18 1428  AST 74*  ALT 16  ALKPHOS 86  BILITOT 0.9  PROT 7.4  ALBUMIN 3.9   Recent Labs  Lab 01/04/18 1428  LIPASE 42   Recent Labs  Lab 01/04/18 1425  AMMONIA 44*   Coagulation Profile: Recent Labs  Lab 01/04/18 1428  INR 0.93   Cardiac Enzymes: No results for input(s): CKTOTAL, CKMB, CKMBINDEX, TROPONINI in the last 168 hours. BNP (last 3 results) No results for input(s): PROBNP in the last 8760 hours. HbA1C: No results for input(s): HGBA1C in the last 72 hours. CBG: No results for input(s): GLUCAP in the last 168 hours. Lipid Profile: No results for input(s): CHOL, HDL, LDLCALC, TRIG, CHOLHDL, LDLDIRECT in the last 72 hours. Thyroid Function Tests: No results for input(s): TSH, T4TOTAL, FREET4, T3FREE, THYROIDAB in the last 72 hours. Anemia Panel: No results for input(s): VITAMINB12,  FOLATE, FERRITIN, TIBC, IRON, RETICCTPCT in the last 72 hours. Urine analysis:    Component Value Date/Time   COLORURINE YELLOW 01/04/2018 1842   APPEARANCEUR CLEAR 01/04/2018 1842   LABSPEC 1.010 01/04/2018 1842   LABSPEC 1.030 09/01/2017 1443   PHURINE 7.0 01/04/2018 1842   GLUCOSEU NEGATIVE 01/04/2018 1842   HGBUR SMALL (A) 01/04/2018 1842   BILIRUBINUR NEGATIVE 01/04/2018 1842   BILIRUBINUR moderate (A) 09/01/2017 1443   KETONESUR 5 (A) 01/04/2018 1842   PROTEINUR NEGATIVE 01/04/2018 1842   UROBILINOGEN 0.2 07/25/2007 0900   NITRITE NEGATIVE 01/04/2018 1842   LEUKOCYTESUR NEGATIVE 01/04/2018 1842     Terri Ayala M.D. [facility] 01/07/2018, 12:25 PM  Pager: [phone] Between 7am to 7pm - call Pager - [phone]  After 7pm go to [email or web address] - password [email or web address]  Call night coverage person covering after 7pm

## 2018-01-08 LAB — BASIC METABOLIC PANEL
Anion gap: 11 (ref 5–15)
BUN: 12 mg/dL (ref 8–23)
CO2: 26 mmol/L (ref 22–32)
Calcium: 8.9 mg/dL (ref 8.9–10.3)
Chloride: 99 mmol/L (ref 98–111)
Creatinine, Ser: 0.78 mg/dL (ref 0.44–1.00)
GFR calc Af Amer: >60 mL/min (ref >60)
GLUCOSE: 78 mg/dL (ref 70–99)
POTASSIUM: 3.1 mmol/L — AB (ref 3.5–5.1)
Sodium: 136 mmol/L (ref 135–145)

## 2018-01-08 MED ORDER — POTASSIUM CHLORIDE CRYS ER 20 MEQ PO TBCR
40.0000 meq | EXTENDED_RELEASE_TABLET | Freq: Once | ORAL | Status: AC
  Administered 2018-01-08: 40 meq via ORAL
  Filled 2018-01-08: qty 2

## 2018-01-08 MED ORDER — GUAIFENESIN-DM 100-10 MG/5ML PO SYRP: 5.0000 mL | ORAL_SOLUTION | ORAL | Status: DC | PRN

## 2018-01-08 NOTE — Progress Notes (Signed)
[facility]                                                                              Patient Demographics  Terri Ayala, is a 62 y.o. female, DOB - 03/02/1955, [id number]  Admit date - 01/04/2018   Admitting Physician Karmen Bongo, MD  Outpatient Primary MD for the patient is Patient, No Pcp Per  Outpatient specialists:   LOS - 3  days   Medical records reviewed and are as summarized below:    Chief Complaint  Patient presents with  . Alcohol Intoxication       Brief summary   62 year old female with history of alcohol abuse, hyperlipidemia presented for alcohol detox and after a fall.  Patient reported that she fell on the stairs and had difficulty getting up.  She reported that she was trying to get up stairs for the first time since she broke her ribs, last drink was at night before the admission on 11/6.    Assessment & Plan    Principal Problem: Alcohol dependence with withdrawals and delirium (HCC) -Chronic alcohol dependence and has failed to rehab multiple times with high risk of seizures and DTs -Continue scheduled Valium with IV Ativan CIWA protocol - now alert and oriented, delirium resolved.,  Still somewhat tremulous but improving - last CIWA 3   Active Problems: Hypertension BP improving, tachycardia improved, continue propranolol  Fall with rib fractures Patient had a fall on 10/22, had a right 8, 9 posterior lateral minimally displaced acute rib fractures -Continue Lidoderm patch, incentive spirometry, pain control     Osteoporosis -Continue Fosamax once a week  Hypokalemia with hypomagnesemia Potassium 3.1, replaced  Elevated AST with thrombocytopenia Likely due to alcoholic liver disease Platelets stable  Code Status: Full code DVT Prophylaxis: Lovenox Family Communication: No family member at the bedside   Disposition Plan: CIWA is improving, alert and oriented, still slightly tremulous.  Start PT today, may  benefit from skilled nursing facility for rehab or residential alcohol rehab, lives alone and high risk of relapse.  Social work consult placed  Time Spent in minutes   25  Procedures:  None  Consultants:   None  Antimicrobials:      Medications  Scheduled Meds: . busPIRone  15 mg Oral TID  . diazepam  5 mg Oral Q6H  . docusate sodium  100 mg Oral BID  . DULoxetine  60 mg Oral Daily  . enoxaparin (LOVENOX) injection  40 mg Subcutaneous Q24H  . folic acid  1 mg Oral Daily  . gabapentin  300 mg Oral BID  . lidocaine  1 patch Transdermal Q24H  . multivitamin with minerals  1 tablet Oral Daily  . pantoprazole  40 mg Oral Q1200  . propranolol  20 mg Oral BID  . sodium chloride flush  3 mL Intravenous Q12H  . thiamine  100 mg Oral Daily  . traZODone  50 mg Oral QHS   Continuous Infusions: . lactated ringers Stopped (01/05/18 1806)   PRN Meds:.acetaminophen **OR** acetaminophen, guaiFENesin-dextromethorphan, LORazepam, ondansetron **OR** ondansetron (ZOFRAN) IV, SUMAtriptan   Antibiotics   Anti-infectives (From admission, onward)  None        Subjective:   Terri Ayala was seen and examined today.  Alert and oriented, still somewhat tremulous but improving.  No abdominal pain, chest pain, shortness of breath no nausea vomiting.  Objective:   Vitals:   01/08/18 0330 01/08/18 0400 01/08/18 0600 01/08/18 0817  BP: (!) 158/86 (!) 150/96  (!) 150/92  Pulse: 78 88 84 96  Resp: 15 11 18 20   Temp:    98.8 F (37.1 C)  TempSrc:    Oral  SpO2: 100% 100% 95% 98%  Weight:      Height:        Intake/Output Summary (Last 24 hours) at 01/08/2018 1209 Last data filed at 01/08/2018 0331 Gross per 24 hour  Intake -  Output 350 ml  Net -350 ml     Wt Readings from Last 3 Encounters:  01/04/18 68 kg  12/10/17 62.6 kg  09/29/17 59 kg     Exam    General: Alert and oriented x 3, NAD  Eyes:   HEENT:  Atraumatic, normocephalic  Cardiovascular: S1 S2 clear,  RRR  Respiratory: CTAB  Gastrointestinal: Soft, NT, ND, + bowel sounds  Ext: no pedal edema bilaterally, tremulous but improving   Neuro: No neuro deficits  Musculoskeletal: No digital cyanosis, clubbing  Skin: No rashes  Psych: Normal affect and demeanor, alert and oriented x3     Data Reviewed:  I have personally reviewed following labs and imaging studies  Micro Results No results found for this or any previous visit (from the past 240 hour(s)).  Radiology Reports Dg Ribs Unilateral W/chest Right  Result Date: 12/29/2017 CLINICAL DATA:  Right rib pain after fall. EXAM: RIGHT RIBS AND CHEST - 3+ VIEW COMPARISON:  Right rib radiographs 12/19/2017 FINDINGS: Unchanged appearance of fracture of the posterolateral right eighth rib. The ninth rib fracture is less clearly visualized on the current study. No other rib fracture visualized, specifically at the marked location of pain. The marked location is slightly inferior to the ribcage. Lungs are clear. No pleural effusion or pneumothorax. IMPRESSION: Unchanged appearance of right eighth rib fracture. Previously demonstrated right ninth rib fracture is less well visualized today. This may be projectional. No other fractures. Electronically Signed   By: Ulyses Jarred M.D.   On: 12/29/2017 16:35   Dg Ribs Unilateral W/chest Right  Result Date: 12/19/2017 CLINICAL DATA:  62 y/o F; fall with pain to the posterior and lateral right ribs. EXAM: RIGHT RIBS AND CHEST - 3+ VIEW COMPARISON:  12/08/2017 chest radiograph. FINDINGS: Right 8 and 9 posterolateral minimally displaced acute rib fractures. Clear lungs. Stable cardiac silhouette. No pleural effusion or pneumothorax. Cholecystectomy clips in the right upper quadrant. IMPRESSION: Right 8 and 9 posterolateral minimally displaced acute rib fractures. No pneumothorax. Electronically Signed   By: Kristine Garbe M.D.   On: 12/19/2017 16:20   Dg Chest Port 1 View  Result Date:  01/04/2018 CLINICAL DATA:  Patient states she has right rib fractures from a fall last week. EXAM: PORTABLE CHEST 1 VIEW COMPARISON:  December 29, 2017 FINDINGS: The patient has known rib fractures are not as well visualized on this portable chest x-ray. The heart, hila, mediastinum, lungs, and pleura are unremarkable. No pneumothorax. IMPRESSION: The known right eighth rib fracture is not as well appreciated on this study. No acute abnormality. No pneumothorax. Electronically Signed   By: Dorise Bullion III M.D   On: 01/04/2018 14:42    Lab Data:  CBC: Recent  Labs  Lab 01/04/18 1428 01/05/18 0401 01/06/18 0500  WBC 8.2 5.4 5.9  NEUTROABS 6.8  --   --   HGB 12.6 11.2* 12.8  HCT 39.3 33.9* 38.9  MCV 103.4* 100.9* 100.8*  PLT 132* 126* 092*   Basic Metabolic Panel: Recent Labs  Lab 01/04/18 1428 01/05/18 0401 01/06/18 0500 01/07/18 0547 01/08/18 0551  NA 138 137 135 137 136  K 4.5 3.2* 3.3* 3.4* 3.1*  CL 102 104 99 103 99  CO2 23 25 24 24 26   GLUCOSE 99 85 102* 101* 78  BUN <5* 6* 5* 11 12  CREATININE 0.66 0.81 0.85 0.92 0.78  CALCIUM 8.8* 8.2* 8.8* 9.3 8.9  MG  --  1.5* 2.3  --   --    GFR: Estimated Creatinine Clearance: 65.1 mL/min (by C-G formula based on SCr of 0.78 mg/dL). Liver Function Tests: Recent Labs  Lab 01/04/18 1428  AST 74*  ALT 16  ALKPHOS 86  BILITOT 0.9  PROT 7.4  ALBUMIN 3.9   Recent Labs  Lab 01/04/18 1428  LIPASE 42   Recent Labs  Lab 01/04/18 1425  AMMONIA 44*   Coagulation Profile: Recent Labs  Lab 01/04/18 1428  INR 0.93   Cardiac Enzymes: No results for input(s): CKTOTAL, CKMB, CKMBINDEX, TROPONINI in the last 168 hours. BNP (last 3 results) No results for input(s): PROBNP in the last 8760 hours. HbA1C: No results for input(s): HGBA1C in the last 72 hours. CBG: No results for input(s): GLUCAP in the last 168 hours. Lipid Profile: No results for input(s): CHOL, HDL, LDLCALC, TRIG, CHOLHDL, LDLDIRECT in the last 72  hours. Thyroid Function Tests: No results for input(s): TSH, T4TOTAL, FREET4, T3FREE, THYROIDAB in the last 72 hours. Anemia Panel: No results for input(s): VITAMINB12, FOLATE, FERRITIN, TIBC, IRON, RETICCTPCT in the last 72 hours. Urine analysis:    Component Value Date/Time   COLORURINE YELLOW 01/04/2018 1842   APPEARANCEUR CLEAR 01/04/2018 1842   LABSPEC 1.010 01/04/2018 1842   LABSPEC 1.030 09/01/2017 1443   PHURINE 7.0 01/04/2018 1842   GLUCOSEU NEGATIVE 01/04/2018 1842   HGBUR SMALL (A) 01/04/2018 1842   BILIRUBINUR NEGATIVE 01/04/2018 1842   BILIRUBINUR moderate (A) 09/01/2017 1443   KETONESUR 5 (A) 01/04/2018 1842   PROTEINUR NEGATIVE 01/04/2018 1842   UROBILINOGEN 0.2 07/25/2007 0900   NITRITE NEGATIVE 01/04/2018 1842   LEUKOCYTESUR NEGATIVE 01/04/2018 1842       M.D. [facility] 01/08/2018, 12:09 PM  Pager: [phone] Between 7am to 7pm - call Pager - [phone]  After 7pm go to [email or web address] - password [email or web address]  Call night coverage person covering after 7pm

## 2018-01-08 NOTE — Progress Notes (Addendum)
Tx performed and chartedby SPTA under direct guidance and supervision of PTA at all times. This session was performed under the supervision of a licensed clinician.   Charting reviewed for accuracy and depicts tx performed and services provided.  Governor Rooks, PTA Acute Rehabilitation Services Pager [phone] Office [phone]    Physical Therapy Treatment Patient Details Name: Terri Ayala MRN: [id number] DOB: Sep 06, 1955 Today's Date: 01/08/2018    History of Present Illness Pt is a 62 y.o. F with significant PMH for alcohol abuse, SVT, fx ribs and recent falls presenting to ED with reports of alcohol withdrawal symptoms. Pt has recent admissions to ED on 12/19/2017 for fx of R ribs 8 and 9, and on 01/01/2018 for a fall onto her knees.     PT Comments    Patient's tolerance to treatment today was fair.  Patient was in bed with no visitors present upon PT arrival.  Patient required min guard for bed mobility to ensure safety.  Therapist provided min assist and VC for proper hand placement during sit to stand.  Patient ambulated to acute rehab gym demonstrating improvement with awareness of objects in path.  Patient required assistance with maintaining path of RW throughout today's session.  Patient would continue to benefit from acute care PT in order to address balance and mobility deficits.  She remains a good candidate for home health PT based on current functional status.  Pt reports she has a friend who can stay with her for a week.   Follow Up Recommendations  Home health PT;Supervision/Assistance - 24 hour(Pt will require SNF placement if 24 hr assistance cannot be provided)     Equipment Recommendations  None recommended by PT    Recommendations for Other Services       Precautions / Restrictions Precautions Precautions: Fall(Seizure) Restrictions Weight Bearing Restrictions: No    Mobility  Bed Mobility Overal bed mobility: Needs Assistance Bed Mobility: Supine  to Sit     Supine to sit: Min guard     General bed mobility comments: Pt was able to complete supine to sit only requiring min guard for safety with HOB elevated.  Pt was able to move B LE over EOB without assistance from therapist.  Transfers Overall transfer level: Needs assistance Equipment used: Rolling walker (2 wheeled) Transfers: Sit to/from Stand Sit to Stand: Min assist         General transfer comment: Pt required VC for proper hand placement on bed to initiate sit to stand.  She demonstrated tendency to pull on RW for initial movement.  Pt continues to require min A for stability once standing.  Ambulation/Gait Ambulation/Gait assistance: Min guard Gait Distance (Feet): 180 Feet Assistive device: Rolling walker (2 wheeled) Gait Pattern/deviations: Step-through pattern;Decreased step length - right;Decreased step length - left;Drifts right/left Gait velocity: decreased   General Gait Details: Pt continues to require min guard for safety during ambulation but demonstrates improved understanding of objects in path.  She continues to drift towards the left requiring min A to maintain path of RW.  Pt required min VC to look up during ambulation with noted gazing at floor occasionally.   Stairs Stairs: Yes Stairs assistance: Min assist Stair Management: Two rails;Step to pattern;Forwards Number of Stairs: 4 General stair comments: Pt required min VC for ascending/descending stairs with proper technique.  Pt required min A for safety awareness.   Wheelchair Mobility    Modified Rankin (Stroke Patients Only)       Balance Overall balance assessment:  Needs assistance Sitting-balance support: Feet supported;Single extremity supported Sitting balance-Leahy Scale: Fair Sitting balance - Comments: Pt demonstrated improvement with sitting balance with no UE support intermittently while donning gait belt.   Standing balance support: Bilateral upper extremity  supported Standing balance-Leahy Scale: Poor Standing balance comment: Pt requires B UE support from RW while standing.  Patient demonstrated good standing tolerance in RW while therapist adjusted brief prior to stair training.  LOB with external force.                            Cognition Arousal/Alertness: Awake/alert Behavior During Therapy: WFL for tasks assessed/performed Overall Cognitive Status: Within Functional Limits for tasks assessed Area of Impairment: Safety/judgement;Awareness                   Current Attention Level: Sustained   Following Commands: Follows one step commands with increased time;Follows one step commands consistently Safety/Judgement: Decreased awareness of safety Awareness: Emergent Problem Solving: Slow processing;Requires verbal cues General Comments: Pt demonstrated improved safety awareness and problem solving of objects in her path during ambulation.      Exercises      General Comments        Pertinent Vitals/Pain Pain Assessment: 0-10 Pain Score: 7  Pain Location: ribs Pain Descriptors / Indicators: Sharp;Dull;Aching;Grimacing Pain Intervention(s): Limited activity within patient's tolerance;Monitored during session    Home Living                      Prior Function            PT Goals (current goals can now be found in the care plan section) Acute Rehab PT Goals Patient Stated Goal: none stated PT Goal Formulation: With patient Time For Goal Achievement: 01/19/18 Potential to Achieve Goals: Fair Progress towards PT goals: Progressing toward goals    Frequency    Min 3X/week      PT Plan Discharge plan needs to be updated    Co-evaluation              AM-PAC PT "6 Clicks" Daily Activity  Outcome Measure  Difficulty turning over in bed (including adjusting bedclothes, sheets and blankets)?: A Lot Difficulty moving from lying on back to sitting on the side of the bed? : A  Little Difficulty sitting down on and standing up from a chair with arms (e.g., wheelchair, bedside commode, etc,.)?: A Little Help needed moving to and from a bed to chair (including a wheelchair)?: A Little Help needed walking in hospital room?: A Little Help needed climbing 3-5 steps with a railing? : A Little 6 Click Score: 17    End of Session Equipment Utilized During Treatment: Gait belt Activity Tolerance: Patient tolerated treatment well;Patient limited by fatigue Patient left: in chair;with nursing/sitter in room Nurse Communication: Mobility status PT Visit Diagnosis: Unsteadiness on feet (R26.81);Other abnormalities of gait and mobility (R26.89);Muscle weakness (generalized) (M62.81);Repeated falls (R29.6)     Time: 7001-7494 PT Time Calculation (min) (ACUTE ONLY): 26 min  Charges:  $Gait Training: 8-22 mins $Therapeutic Activity: 8-22 mins                     [address], SPTA   Megan Branch 01/08/2018, 5:28 PM

## 2018-01-09 LAB — BASIC METABOLIC PANEL
Anion gap: 10 (ref 5–15)
BUN: 12 mg/dL (ref 8–23)
CALCIUM: 9 mg/dL (ref 8.9–10.3)
CO2: 24 mmol/L (ref 22–32)
CREATININE: 0.77 mg/dL (ref 0.44–1.00)
Chloride: 101 mmol/L (ref 98–111)
GFR calc non Af Amer: >60 mL/min (ref >60)
Glucose, Bld: 91 mg/dL (ref 70–99)
Potassium: 3.8 mmol/L (ref 3.5–5.1)
Sodium: 135 mmol/L (ref 135–145)

## 2018-01-09 LAB — CBC
HCT: 39 % (ref 36.0–46.0)
Hemoglobin: 12.5 g/dL (ref 12.0–15.0)
MCH: 32.5 pg (ref 26.0–34.0)
MCHC: 32.1 g/dL (ref 30.0–36.0)
MCV: 101.3 fL — AB (ref 80.0–100.0)
NRBC: 0 % (ref 0.0–0.2)
PLATELETS: 184 10*3/uL (ref 150–400)
RBC: 3.85 MIL/uL — ABNORMAL LOW (ref 3.87–5.11)
RDW: 11.9 % (ref 11.5–15.5)
WBC: 7 10*3/uL (ref 4.0–10.5)

## 2018-01-09 LAB — MAGNESIUM: Magnesium: 1.8 mg/dL (ref 1.7–2.4)

## 2018-01-09 MED ORDER — LORAZEPAM 1 MG PO TABS: 1.0000 mg | ORAL_TABLET | Freq: Four times a day (QID) | ORAL | Status: DC | PRN

## 2018-01-09 MED ORDER — LORAZEPAM 2 MG/ML IJ SOLN: 1.0000 mg | Freq: Four times a day (QID) | INTRAMUSCULAR | Status: DC | PRN

## 2018-01-09 MED ORDER — DIAZEPAM 5 MG PO TABS
5.0000 mg | ORAL_TABLET | Freq: Three times a day (TID) | ORAL | Status: DC
  Administered 2018-01-09 – 2018-01-10 (×3): 5 mg via ORAL
  Filled 2018-01-09 (×3): qty 1

## 2018-01-09 NOTE — Progress Notes (Signed)
Physical Therapy Treatment Patient Details Name: Terri Ayala MRN: [id number] DOB: 1955-04-28 Today's Date: 01/09/2018    History of Present Illness Pt is a 62 y.o. F with significant PMH for alcohol abuse, SVT, fx ribs and recent falls presenting to ED with reports of alcohol withdrawal symptoms. Pt has recent admissions to ED on 12/19/2017 for fx of R ribs 8 and 9, and on 01/01/2018 for a fall onto her knees.     PT Comments    Patient's tolerance to treatment today was good.  Patient was in bed asleep with no family present upon PT arrival.  Patient was drowsy but agreeable to therapy today.  Patient completed hallway ambulation with RW requiring min VC to look forward.  Patient was able to complete standing LE strengthening exercises at sink requiring min VC for proper technique and to stand upright.  Patient would continue to benefit from acute care PT in order to address balance and mobility deficits.  Patient continues to be a good candidate for HHPT with 24 hour supervision based on current functional status.    Follow Up Recommendations  Home health PT;Supervision/Assistance - 24 hour(Pt will require SNF placement if 24 hr assistance cannot be provided)     Equipment Recommendations  None recommended by PT    Recommendations for Other Services       Precautions / Restrictions Precautions Precautions: Fall(Seizure) Restrictions Weight Bearing Restrictions: No    Mobility  Bed Mobility Overal bed mobility: Needs Assistance Bed Mobility: Supine to Sit     Supine to sit: Min guard     General bed mobility comments: Pt required min guard for supine to sit for safety with HOB elevated.  Pt continues to demonstrate good initiation with moving B LE over EOB without requiring assistance.  Transfers Overall transfer level: Needs assistance Equipment used: Rolling walker (2 wheeled) Transfers: Sit to/from Stand Sit to Stand: Min assist         General transfer comment:  Pt demonstrated proper hand placement on bed prior to standing to RW.  Pt required VC to scoot to EOB for B LE support from floor.  Patient required increased time for elevation of trunk once standing in RW.  Ambulation/Gait Ambulation/Gait assistance: Min guard Gait Distance (Feet): 100 Feet Assistive device: Rolling walker (2 wheeled) Gait Pattern/deviations: Step-through pattern;Decreased step length - right;Decreased step length - left;Drifts right/left Gait velocity: decreased   General Gait Details: Pt required min guard for safety awareness.  She demonstrated improvement with maintaining path of RW only requiring assistance to navigate objects in room.  Pt continues to occasionally gaze at floor during ambulation requiring min VC for correction.   Stairs             Wheelchair Mobility    Modified Rankin (Stroke Patients Only)       Balance Overall balance assessment: Needs assistance Sitting-balance support: Feet supported;No upper extremity supported Sitting balance-Leahy Scale: Fair     Standing balance support: Bilateral upper extremity supported Standing balance-Leahy Scale: Poor Standing balance comment: Pt requires B UE support from RW while standing.  Patient was able to complete standing LE exercises while standing at sink.                            Cognition Arousal/Alertness: Awake/alert Behavior During Therapy: WFL for tasks assessed/performed Overall Cognitive Status: Within Functional Limits for tasks assessed Area of Impairment: Safety/judgement;Awareness  Current Attention Level: Sustained   Following Commands: Follows one step commands with increased time;Follows one step commands consistently Safety/Judgement: Decreased awareness of safety Awareness: Emergent Problem Solving: Slow processing;Requires verbal cues        Exercises General Exercises - Lower Extremity Hip ABduction/ADduction: AROM;Both;10  reps;Standing Hip Flexion/Marching: AROM;Both;Standing(2x10) Other Exercises Other Exercises: B hip extension; AROM; standing; 10 reps    General Comments        Pertinent Vitals/Pain Pain Assessment: No/denies pain    Home Living                      Prior Function            PT Goals (current goals can now be found in the care plan section) Acute Rehab PT Goals Patient Stated Goal: none stated PT Goal Formulation: With patient Time For Goal Achievement: 01/19/18 Potential to Achieve Goals: Fair Progress towards PT goals: Progressing toward goals    Frequency    Min 3X/week      PT Plan Discharge plan needs to be updated    Co-evaluation              AM-PAC PT "6 Clicks" Daily Activity  Outcome Measure  Difficulty turning over in bed (including adjusting bedclothes, sheets and blankets)?: A Lot Difficulty moving from lying on back to sitting on the side of the bed? : A Little Difficulty sitting down on and standing up from a chair with arms (e.g., wheelchair, bedside commode, etc,.)?: A Little Help needed moving to and from a bed to chair (including a wheelchair)?: A Little Help needed walking in hospital room?: A Little Help needed climbing 3-5 steps with a railing? : A Little 6 Click Score: 17    End of Session Equipment Utilized During Treatment: Gait belt Activity Tolerance: Patient tolerated treatment well Patient left: in chair;with call bell/phone within reach;with chair alarm set Nurse Communication: Mobility status PT Visit Diagnosis: Unsteadiness on feet (R26.81);Other abnormalities of gait and mobility (R26.89);Muscle weakness (generalized) (M62.81);Repeated falls (R29.6)     Time: 0350-0938 PT Time Calculation (min) (ACUTE ONLY): 32 min  Charges:  $Gait Training: 8-22 mins $Therapeutic Activity: 8-22 mins                     [address] SPTA   Terri Ayala 01/09/2018, 5:00 PM

## 2018-01-09 NOTE — Progress Notes (Signed)
[facility]                                                                              Patient Demographics  Terri Ayala, is a 62 y.o. female, DOB - 1955-10-13, VQQ:[id number]  Admit date - 01/04/2018   Admitting Physician Karmen Bongo, MD  Outpatient Primary MD for the patient is Patient, No Pcp Per  Outpatient specialists:   LOS - 4  days   Medical records reviewed and are as summarized below:    Chief Complaint  Patient presents with  . Alcohol Intoxication       Brief summary   62 year old female with history of alcohol abuse, hyperlipidemia presented for alcohol detox and after a fall.  Patient reported that she fell on the stairs and had difficulty getting up.  She reported that she was trying to get up stairs for the first time since she broke her ribs, last drink was at night before the admission on 11/6.    Assessment & Plan    Principal Problem: Alcohol dependence with withdrawals and delirium (HCC) -Chronic alcohol dependence and has failed to rehab multiple times with high risk of seizures and DTs -Continue scheduled Valium with IV Ativan CIWA protocol - now alert and oriented, delirium resolved, still tremulous, on scheduled Valium and IV Ativan as needed with CIWA protocol. - last CIWA 11 -PT evaluation recommended 24-hour supervision versus skilled nursing facility.  Patient wants to go home "to fix the front door" it was broken down by the EMS however lives alone and high risk of relapse and still in withdrawals.  Active Problems: Hypertension Continue propranolol, heart rate 90  Fall with rib fractures Patient had a fall on 10/22, had a right 8, 9 posterior lateral minimally displaced acute rib fractures -Continue Lidoderm patch, incentive spirometry, pain control    Osteoporosis -Continue Fosamax once a week  Hypokalemia with hypomagnesemia Potassium 3.1, replaced  Elevated AST with thrombocytopenia Likely due to alcoholic  liver disease Platelets stable  Code Status: Full code DVT Prophylaxis: Lovenox Family Communication: No family member at the bedside   Disposition Plan: CIWA high 11, not a safe plan for discharge yet  Time Spent in minutes   25  Procedures:  None  Consultants:   None  Antimicrobials:      Medications  Scheduled Meds: . busPIRone  15 mg Oral TID  . diazepam  5 mg Oral Q6H  . docusate sodium  100 mg Oral BID  . DULoxetine  60 mg Oral Daily  . enoxaparin (LOVENOX) injection  40 mg Subcutaneous Q24H  . folic acid  1 mg Oral Daily  . gabapentin  300 mg Oral BID  . lidocaine  1 patch Transdermal Q24H  . multivitamin with minerals  1 tablet Oral Daily  . pantoprazole  40 mg Oral Q1200  . propranolol  20 mg Oral BID  . sodium chloride flush  3 mL Intravenous Q12H  . thiamine  100 mg Oral Daily  . traZODone  50 mg Oral QHS   Continuous Infusions:  PRN Meds:.acetaminophen **OR** acetaminophen, guaiFENesin-dextromethorphan, LORazepam, ondansetron **OR** ondansetron (ZOFRAN) IV, SUMAtriptan  Antibiotics   Anti-infectives (From admission, onward)   None        Subjective:   Terri Ayala was seen and examined today.  Currently alert and oriented however still tremulous and shaky.   No abdominal pain, chest pain, shortness of breath no nausea vomiting.  Objective:   Vitals:   01/09/18 0451 01/09/18 0747 01/09/18 1020 01/09/18 1221  BP: 130/77 137/79 133/71 (!) 138/93  Pulse:   91   Resp: 13 12  (!) 22  Temp: 98 F (36.7 C) 98.9 F (37.2 C)  98.4 F (36.9 C)  TempSrc: Oral Oral  Oral  SpO2: 98% 100%  100%  Weight:      Height:       No intake or output data in the 24 hours ending 01/09/18 1258   Wt Readings from Last 3 Encounters:  01/04/18 68 kg  12/10/17 62.6 kg  09/29/17 59 kg     Exam    General: Alert and oriented x 3, NAD  Eyes:   HEENT:  Atraumatic, normocephalic  Cardiovascular: S1 S2 auscultated, RRR. No pedal edema  b/l  Respiratory: Clear to auscultation bilaterally, no wheezing, rales or rhonchi  Gastrointestinal: Soft, nontender, nondistended, + bowel sounds  Ext: no pedal edema bilaterally  Neuro: tremulous but moving all 4 extremities spontaneously  Musculoskeletal: No digital cyanosis, clubbing  Skin: No rashes  Psych: Normal affect and demeanor, alert and oriented x3      Data Reviewed:  I have personally reviewed following labs and imaging studies  Micro Results No results found for this or any previous visit (from the past 240 hour(s)).  Radiology Reports Dg Ribs Unilateral W/chest Right  Result Date: 12/29/2017 CLINICAL DATA:  Right rib pain after fall. EXAM: RIGHT RIBS AND CHEST - 3+ VIEW COMPARISON:  Right rib radiographs 12/19/2017 FINDINGS: Unchanged appearance of fracture of the posterolateral right eighth rib. The ninth rib fracture is less clearly visualized on the current study. No other rib fracture visualized, specifically at the marked location of pain. The marked location is slightly inferior to the ribcage. Lungs are clear. No pleural effusion or pneumothorax. IMPRESSION: Unchanged appearance of right eighth rib fracture. Previously demonstrated right ninth rib fracture is less well visualized today. This may be projectional. No other fractures. Electronically Signed   By: Ulyses Jarred M.D.   On: 12/29/2017 16:35   Dg Ribs Unilateral W/chest Right  Result Date: 12/19/2017 CLINICAL DATA:  62 y/o F; fall with pain to the posterior and lateral right ribs. EXAM: RIGHT RIBS AND CHEST - 3+ VIEW COMPARISON:  12/08/2017 chest radiograph. FINDINGS: Right 8 and 9 posterolateral minimally displaced acute rib fractures. Clear lungs. Stable cardiac silhouette. No pleural effusion or pneumothorax. Cholecystectomy clips in the right upper quadrant. IMPRESSION: Right 8 and 9 posterolateral minimally displaced acute rib fractures. No pneumothorax. Electronically Signed   By: Kristine Garbe M.D.   On: 12/19/2017 16:20   Dg Chest Port 1 View  Result Date: 01/04/2018 CLINICAL DATA:  Patient states she has right rib fractures from a fall last week. EXAM: PORTABLE CHEST 1 VIEW COMPARISON:  December 29, 2017 FINDINGS: The patient has known rib fractures are not as well visualized on this portable chest x-ray. The heart, hila, mediastinum, lungs, and pleura are unremarkable. No pneumothorax. IMPRESSION: The known right eighth rib fracture is not as well appreciated on this study. No acute abnormality. No pneumothorax. Electronically Signed   By: Dorise Bullion III M.D   On: 01/04/2018  14:42    Lab Data:  CBC: Recent Labs  Lab 01/04/18 1428 01/05/18 0401 01/06/18 0500 01/09/18 0655  WBC 8.2 5.4 5.9 7.0  NEUTROABS 6.8  --   --   --   HGB 12.6 11.2* 12.8 12.5  HCT 39.3 33.9* 38.9 39.0  MCV 103.4* 100.9* 100.8* 101.3*  PLT 132* 126* 134* 098   Basic Metabolic Panel: Recent Labs  Lab 01/05/18 0401 01/06/18 0500 01/07/18 0547 01/08/18 0551 01/09/18 0655  NA 137 135 137 136 135  K 3.2* 3.3* 3.4* 3.1* 3.8  CL 104 99 103 99 101  CO2 25 24 24 26 24   GLUCOSE 85 102* 101* 78 91  BUN 6* 5* 11 12 12   CREATININE 0.81 0.85 0.92 0.78 0.77  CALCIUM 8.2* 8.8* 9.3 8.9 9.0  MG 1.5* 2.3  --   --  1.8   GFR: Estimated Creatinine Clearance: 65.1 mL/min (by C-G formula based on SCr of 0.77 mg/dL). Liver Function Tests: Recent Labs  Lab 01/04/18 1428  AST 74*  ALT 16  ALKPHOS 86  BILITOT 0.9  PROT 7.4  ALBUMIN 3.9   Recent Labs  Lab 01/04/18 1428  LIPASE 42   Recent Labs  Lab 01/04/18 1425  AMMONIA 44*   Coagulation Profile: Recent Labs  Lab 01/04/18 1428  INR 0.93   Cardiac Enzymes: No results for input(s): CKTOTAL, CKMB, CKMBINDEX, TROPONINI in the last 168 hours. BNP (last 3 results) No results for input(s): PROBNP in the last 8760 hours. HbA1C: No results for input(s): HGBA1C in the last 72 hours. CBG: No results for input(s): GLUCAP in  the last 168 hours. Lipid Profile: No results for input(s): CHOL, HDL, LDLCALC, TRIG, CHOLHDL, LDLDIRECT in the last 72 hours. Thyroid Function Tests: No results for input(s): TSH, T4TOTAL, FREET4, T3FREE, THYROIDAB in the last 72 hours. Anemia Panel: No results for input(s): VITAMINB12, FOLATE, FERRITIN, TIBC, IRON, RETICCTPCT in the last 72 hours. Urine analysis:    Component Value Date/Time   COLORURINE YELLOW 01/04/2018 1842   APPEARANCEUR CLEAR 01/04/2018 1842   LABSPEC 1.010 01/04/2018 1842   LABSPEC 1.030 09/01/2017 1443   PHURINE 7.0 01/04/2018 1842   GLUCOSEU NEGATIVE 01/04/2018 1842   HGBUR SMALL (A) 01/04/2018 1842   BILIRUBINUR NEGATIVE 01/04/2018 1842   BILIRUBINUR moderate (A) 09/01/2017 1443   KETONESUR 5 (A) 01/04/2018 1842   PROTEINUR NEGATIVE 01/04/2018 1842   UROBILINOGEN 0.2 07/25/2007 0900   NITRITE NEGATIVE 01/04/2018 1842   LEUKOCYTESUR NEGATIVE 01/04/2018 1842     Lynda Wanninger M.D. [facility] 01/09/2018, 12:58 PM  Pager: [phone] Between 7am to 7pm - call Pager - [phone]  After 7pm go to [email or web address] - password [email or web address]  Call night coverage person covering after 7pm

## 2018-01-09 NOTE — Progress Notes (Signed)
D/c tele per order

## 2018-01-10 DIAGNOSIS — F1020 Alcohol dependence, uncomplicated: Secondary | ICD-10-CM

## 2018-01-10 DIAGNOSIS — I10 Essential (primary) hypertension: Secondary | ICD-10-CM

## 2018-01-10 DIAGNOSIS — S2241XD Multiple fractures of ribs, right side, subsequent encounter for fracture with routine healing: Secondary | ICD-10-CM

## 2018-01-10 DIAGNOSIS — F10231 Alcohol dependence with withdrawal delirium: Principal | ICD-10-CM

## 2018-01-10 DIAGNOSIS — M810 Age-related osteoporosis without current pathological fracture: Secondary | ICD-10-CM

## 2018-01-10 MED ORDER — ADULT MULTIVITAMIN W/MINERALS CH: 1.0000 | ORAL_TABLET | Freq: Every day | ORAL | 0 refills | Status: DC

## 2018-01-10 MED ORDER — FOLIC ACID 1 MG PO TABS: 1.0000 mg | ORAL_TABLET | Freq: Every day | ORAL | 0 refills | Status: DC

## 2018-01-10 NOTE — Progress Notes (Signed)
Pt being discharged from hospital per orders from MD. Pt educated on discharge instructions. Pt verbalized understanding of instructions. All questions and concerns were addressed. Pt's IV was removed prior to discharge. Pt exited hospital via wheelchair accompanied by staff. 

## 2018-01-10 NOTE — Care Management Note (Addendum)
Case Management Note  Patient Details  Name: Terri Ayala MRN: [id number] Date of Birth: August 19, 1955  Subjective/Objective:                    Action/Plan: Pt discharging home with orders for [facility] services. CM inquired about her calling for an ETOH rehab and she states "she hasnt yet but she is going to". CM again encouraged her to call sooner than later.  CM provided choice for [facility] services and she selected [facility]. Carolyn with [facility] notified and accepted the referral.  Pt with orders for walker. James with [facility] notified and will deliver the equipment to the room. Pt states her friend Terri Ayala) will stay with her at home and provided supervision and assistance.  Pt states she has transportation home.   Addendum: (12:36): received information that [facility] can not accept pt for [facility] services. CM called Butch Penny with [facility] and they can accept pt for [facility].  Dr Clovia Cuff with [facility] has agreed to see patient at home. If this service works pt to cancel other new PCP appt.        Expected Discharge Date:  01/10/18               Expected Discharge Plan:  [place]  In-House Referral:     Discharge planning Services  CM Consult  Post Acute Care Choice:    Choice offered to:     DME Arranged:    DME Agency:     HH Arranged:    HH Agency:     Status of Service:  In process, will continue to follow  If discussed at Long Length of Stay Meetings, dates discussed:    Additional Comments:  Terri Friar, RN 01/10/2018, 11:30 AM

## 2018-01-10 NOTE — Progress Notes (Addendum)
Tx performed and charted by SPTA under direct guidance and supervision of PTA at all times. This session was performed under the supervision of a licensed clinician.   Charting reviewed for accuracy and depicts tx performed and services provided.  Governor Rooks, PTA Acute Rehabilitation Services Pager [phone] Office [phone]    Physical Therapy Treatment Patient Details Name: Terri Ayala MRN: [id number] DOB: [birth year] Today's Date: 01/10/2018    History of Present Illness Pt is a 62 y.o. F with significant PMH for alcohol abuse, SVT, fx ribs and recent falls presenting to ED with reports of alcohol withdrawal symptoms. Pt has recent admissions to ED on 12/19/2017 for fx of R ribs 8 and 9, and on 01/01/2018 for a fall onto her knees.     PT Comments    Patient's tolerance to treatment today was good.  Patient was in bed with no visitors present upon PT arrival.  Patient performed sit to stand x2 from elevated commode requiring increased time for problem solving and VC for hand placement.  Patient ambulated in hallway to acute rehab PT gym and participated in stair training requiring VC for proper technique and hand placement.  Patient reported she felt confident with ascending and descending stairs safely at home.  Patient would continue to benefit from acute care PT in order to address mobility deficits.  Patient continues to be a good candidate for HHPT based on current functional status.  Will inform supervising PT of change in equipment recommendations.    Follow Up Recommendations  Home health PT;Supervision/Assistance - 24 hour     Equipment Recommendations  Rolling walker with 5" wheels( and bedside commode)    Recommendations for Other Services       Precautions / Restrictions Precautions Precautions: Fall(Seizure) Restrictions Weight Bearing Restrictions: No    Mobility  Bed Mobility Overal bed mobility: Needs Assistance Bed Mobility: Supine to Sit;Sit to  Supine     Supine to sit: Supervision Sit to supine: Supervision   General bed mobility comments: Pt required supervision with bed mobility for safety awareness with HOB elevated.  Patient continues to demonstrate good initiation with moving B LE over EOB without requiring assistance.  Transfers Overall transfer level: Needs assistance Equipment used: Rolling walker (2 wheeled) Transfers: Sit to/from [facility] Sit to Stand: Min assist Stand pivot transfers: Min assist       General transfer comment: Pt required VC for proper hand placement on bed prior to standing to RW.  Patient required min assist with performing standing pivot transfer in bathroom and backing to elevated commode.  Pt required VC to reach back to armrests to control descent to commode.  Patient performed sit to stand x2 from elevated commode requiring VC to scoot to edge for B LE support from floor and to push up from armrests.  Patient utilized bathroom railing for first trial.  Patient utilized commode armrests on second trial. Patient required min assist for safety awareness. Patient reported she has a railing in bathroom at home to utilize for assistance with standing from commode.   Ambulation/Gait Ambulation/Gait assistance: Min guard Gait Distance (Feet): 100 Feet Assistive device: Rolling walker (2 wheeled) Gait Pattern/deviations: Step-through pattern;Decreased step length - right;Decreased step length - left;Drifts right/left Gait velocity: decreased   General Gait Details: Pt required min guard for safety awareness.   Stairs Stairs: Yes Stairs assistance: Min assist Stair Management: One rail Right;Step to pattern;Forwards Number of Stairs: 6 General stair comments: Pt required min VC for  ascending/descending stairs with proper technique.  Pt required min A for safety awareness.   Wheelchair Mobility    Modified Rankin (Stroke Patients Only)       Balance Overall balance  assessment: Needs assistance Sitting-balance support: Feet supported;No upper extremity supported Sitting balance-Leahy Scale: Fair     Standing balance support: Bilateral upper extremity supported Standing balance-Leahy Scale: Poor Standing balance comment: reliant on B UE support from RW while standing.                            Cognition Arousal/Alertness: Awake/alert Behavior During Therapy: WFL for tasks assessed/performed Overall Cognitive Status: Within Functional Limits for tasks assessed Area of Impairment: Safety/judgement;Awareness;Problem solving                   Current Attention Level: Sustained   Following Commands: Follows one step commands with increased time;Follows one step commands consistently Safety/Judgement: Decreased awareness of safety Awareness: Emergent Problem Solving: Slow processing;Requires verbal cues        Exercises      General Comments        Pertinent Vitals/Pain Pain Assessment: 0-10 Pain Score: 5  Pain Location: ribs Pain Descriptors / Indicators: Discomfort Pain Intervention(s): Limited activity within patient's tolerance;Monitored during session    Home Living                      Prior Function            PT Goals (current goals can now be found in the care plan section) Acute Rehab PT Goals Patient Stated Goal: none stated PT Goal Formulation: With patient Time For Goal Achievement: 01/19/18 Potential to Achieve Goals: Fair Progress towards PT goals: Progressing toward goals    Frequency    Min 3X/week      PT Plan Discharge plan needs to be updated    Co-evaluation              AM-PAC PT "6 Clicks" Daily Activity  Outcome Measure  Difficulty turning over in bed (including adjusting bedclothes, sheets and blankets)?: A Little Difficulty moving from lying on back to sitting on the side of the bed? : A Little Difficulty sitting down on and standing up from a chair with  arms (e.g., wheelchair, bedside commode, etc,.)?: A Little Help needed moving to and from a bed to chair (including a wheelchair)?: A Little Help needed walking in hospital room?: A Little Help needed climbing 3-5 steps with a railing? : A Little 6 Click Score: 18    End of Session Equipment Utilized During Treatment: Gait belt Activity Tolerance: Patient tolerated treatment well Patient left: in bed;with call bell/phone within reach;with bed alarm set Nurse Communication: Mobility status PT Visit Diagnosis: Unsteadiness on feet (R26.81);Other abnormalities of gait and mobility (R26.89);Muscle weakness (generalized) (M62.81);Repeated falls (R29.6)     Time: 1962-2297 PT Time Calculation (min) (ACUTE ONLY): 19 min  Charges:  $Gait Training: 8-22 mins                     [address] SPTA   Megan Branch 01/10/2018, 2:19 PM

## 2018-01-10 NOTE — Discharge Summary (Signed)
Physician Discharge Summary  Terri Ayala QQP:[id number] DOB: Sep 02, 1955 DOA: 01/04/2018  PCP: Patient, No Pcp Per  Admit date: 01/04/2018 Discharge date: 01/10/2018  Time spent: 45 minutes  Recommendations for Outpatient Follow-up:  Patient will be discharged to home with home health services.  Patient will need to follow up with primary care provider within one week of discharge, repeat CMP Ayala CBC.  Patient should continue medications as prescribed.  Patient should follow a heart healthy diet. Abstain from alcohol use.  Discharge Diagnoses:  Alcohol dependence with withdrawal Ayala delirium Essential hypertension Falls with rib fractures Osteoporosis Hypokalemia/hypomagnesemia Elevated AST with Thrombocytopenia  Discharge Condition: Stable  Diet recommendation: heart healthy  Filed Weights   01/04/18 1422  Weight: 68 kg    History of present illness:  On 01/04/2018 by Dr. Karmen Bongo Terri Ayala is a 62 y.o. female with medical history significant of alcohol dependence Ayala HLD presenting with alcohol withdrawal.   She fell on the stairs.  She was trying to get upstairs for the first time since she broke her ribs - she fell here in the ER previously Ayala broke ribs.  Now, "it's painful to breathe.Terri Ayala with certain movements."  Last drink was last night.  She does want to quit.  Now, "I have a plan, I'm not going to go away with broken ribs, that won't be too comfortable."  After that, she is thinking of going to [facility] then maybe to a 28 day program.  She has "done all this before.  Just in a rough spot."  She thinks she last went in rehab in the spring Ayala was sober a couple of months after.  The first time she went to a 28-day program, she was sober for about 15 months Ayala went to [facility].  She is feeling anxious/jittery.  No AV hallucinations.  No SI/HI.  On average, drinks 1 1/2 bottles of wine.  Her last day of not drinking was Christmas.  Her last drink was last night,  wine.  Hospital Course:  Alcohol dependence with withdrawal Ayala delirium -Chronic alcohol dependence Ayala is failed to rehab multiple times with high risk of seizure Ayala DTs -Patient was placed on scheduled Valium along with IV Ativan, CIWA protocol -Currently alert Ayala oriented, no further signs of tremors.  Delirium has resolved. -PT recommended 24-hour supervision versus skilled nursing facility -Patient refuses to go to nursing facility for rehab, would like to go to alcohol rehab -Case management consulted Ayala appreciated, will set up home health services -Discussed with patient at length, patient understands Ayala is adamant about going home.  She understands the risks of going home. -CIWA down to 2  Essential hypertension -Continue propranolol  Falls with rib fractures -Patient fell on 12/19/2017, had right 8, 9 posterior laterally minimally displaced acute rib fractures -Continue Lidoderm patch, incentive spirometry  Osteoporosis -Continue Fosamax weekly  Hypokalemia/hypomagnesemia -resolved with replacement  -Repeat BMP in 1 week  Elevated AST with Thrombocytopenia -Secondary to alcoholic liver disease -stable  Procedures: None  Consultations: None  Discharge Exam: Vitals:   01/10/18 0138 01/10/18 0742  BP: 122/67 134/70  Pulse: 73 75  Resp: 18 18  Temp: 98.4 F (36.9 C) 98 F (36.7 C)  SpO2: 96% 95%   Has no complaints.  Denies current chest pain, shortness breath, abdominal pain, nausea or vomiting, diarrhea or constipation.  Would like to return home. Does not want to attend rehab at a SNF.   General: Well developed,  well nourished, NAD, appears stated age  84: NCAT, PERRLA, EOMI, Anicteic Sclera, mucous membranes moist.  Neck: Supple, no JVD, no masses  Cardiovascular: S1 S2 auscultated, no rubs, murmurs or gallops. Regular rate Ayala rhythm.  Respiratory: Clear to auscultation bilaterally with equal chest rise  Abdomen: Soft, nontender,  nondistended, + bowel sounds  Extremities: warm dry without cyanosis clubbing or edema  Neuro: AAOx3, cranial nerves grossly intact. Strength 5/5 in patient's upper Ayala lower extremities bilaterally  Skin: Without rashes exudates or nodules  Psych: Normal affect Ayala demeanor with intact judgement Ayala insight  Discharge Instructions Discharge Instructions    Discharge instructions   Complete by:  As directed    Patient will be discharged to home with home health services.  Patient will need to follow up with primary care provider within one week of discharge, repeat CMP Ayala CBC.  Patient should continue medications as prescribed.  Patient should follow a heart healthy diet. Abstain from alcohol use.     Allergies as of 01/10/2018      Reactions   Doxycycline Swelling   Mouth swelling Ayala sores   Codeine Itching   Tetracycline Hcl Swelling   Mouth swelling Ayala sores      Medication List    STOP taking these medications   chlordiazePOXIDE 25 MG capsule Commonly known as:  LIBRIUM     TAKE these medications   alendronate 70 MG tablet Commonly known as:  FOSAMAX Take 70 mg by mouth once a week. Take with a full glass of water on an empty stomach.   busPIRone 15 MG tablet Commonly known as:  BUSPAR Take 15 mg by mouth 3 (three) times daily.   diclofenac sodium 1 % Gel Commonly known as:  VOLTAREN Apply 2 g topically 4 (four) times daily.   DULoxetine 60 MG capsule Commonly known as:  CYMBALTA TAKE 1 CAPSULE (60 MG TOTAL) BY MOUTH DAILY. What changed:  See the new instructions.   folic acid 1 MG tablet Commonly known as:  FOLVITE Take 1 tablet (1 mg total) by mouth daily.   gabapentin 300 MG capsule Commonly known as:  NEURONTIN Take 1 capsule (300 mg total) by mouth 2 (two) times daily.   hydrOXYzine 25 MG tablet Commonly known as:  ATARAX/VISTARIL Take 25 mg by mouth 3 (three) times daily as needed for anxiety.   lidocaine 5 % Commonly known as:   LIDODERM Place 1 patch onto the skin daily. Remove & Discard patch within 12 hours or as directed by MD   multivitamin with minerals Tabs tablet Take 1 tablet by mouth daily.   norethindrone-ethinyl estradiol 1-5 MG-MCG Tabs tablet Commonly known as:  FEMHRT 1/5 Take 1 tablet by mouth daily. For Osteoporosis   ondansetron 4 MG disintegrating tablet Commonly known as:  ZOFRAN-ODT Take 1 tablet (4 mg total) by mouth every 8 (eight) hours as needed for nausea or vomiting.   pantoprazole 40 MG tablet Commonly known as:  PROTONIX Take 1 tablet (40 mg total) by mouth daily at 12 noon.   propranolol 10 MG tablet Commonly known as:  INDERAL Take 20 mg by mouth 2 (two) times daily.   SUMAtriptan 100 MG tablet Commonly known as:  IMITREX Take 1 tablet at the first sign of migraine.  May repeat in 2 hours. What changed:    how much to take  how to take this  when to take this  reasons to take this  additional instructions   thiamine 100 MG  tablet Take 1 tablet (100 mg total) by mouth daily.   traZODone 50 MG tablet Commonly known as:  DESYREL Take 50 mg by mouth at bedtime as needed for sleep.      Allergies  Allergen Reactions  . Doxycycline Swelling    Mouth swelling Ayala sores  . Codeine Itching  . Tetracycline Hcl Swelling    Mouth swelling Ayala sores   Follow-up Information    [facility] Follow up on 02/06/2018.   Why:  Your appointment is with Faith Rogue the NP. Please arrive at 2 pm for paperwork before your appointment. Please bring your photo ID, medications, insurance. Contact information: [place] [address] [phone]           The results of significant diagnostics from this hospitalization (including imaging, microbiology, ancillary Ayala laboratory) are listed below for reference.    Significant Diagnostic Studies: Dg Ribs Unilateral W/chest Right  Result Date:  12/29/2017 CLINICAL DATA:  Right rib pain after fall. EXAM: RIGHT RIBS Ayala CHEST - 3+ VIEW COMPARISON:  Right rib radiographs 12/19/2017 FINDINGS: Unchanged appearance of fracture of the posterolateral right eighth rib. The ninth rib fracture is less clearly visualized on the current study. No other rib fracture visualized, specifically at the marked location of pain. The marked location is slightly inferior to the ribcage. Lungs are clear. No pleural effusion or pneumothorax. IMPRESSION: Unchanged appearance of right eighth rib fracture. Previously demonstrated right ninth rib fracture is less well visualized today. This may be projectional. No other fractures. Electronically Signed   By: Ulyses Jarred M.D.   On: 12/29/2017 16:35   Dg Ribs Unilateral W/chest Right  Result Date: 12/19/2017 CLINICAL DATA:  62 y/o F; fall with pain to the posterior Ayala lateral right ribs. EXAM: RIGHT RIBS Ayala CHEST - 3+ VIEW COMPARISON:  12/08/2017 chest radiograph. FINDINGS: Right 8 Ayala 9 posterolateral minimally displaced acute rib fractures. Clear lungs. Stable cardiac silhouette. No pleural effusion or pneumothorax. Cholecystectomy clips in the right upper quadrant. IMPRESSION: Right 8 Ayala 9 posterolateral minimally displaced acute rib fractures. No pneumothorax. Electronically Signed   By: Kristine Garbe M.D.   On: 12/19/2017 16:20   Dg Chest Port 1 View  Result Date: 01/04/2018 CLINICAL DATA:  Patient states she has right rib fractures from a fall last week. EXAM: PORTABLE CHEST 1 VIEW COMPARISON:  December 29, 2017 FINDINGS: The patient has known rib fractures are not as well visualized on this portable chest x-ray. The heart, hila, mediastinum, lungs, Ayala pleura are unremarkable. No pneumothorax. IMPRESSION: The known right eighth rib fracture is not as well appreciated on this study. No acute abnormality. No pneumothorax. Electronically Signed   By: Dorise Bullion III M.D   On: 01/04/2018 14:42     Microbiology: No results found for this or any previous visit (from the past 240 hour(s)).   Labs: Basic Metabolic Panel: Recent Labs  Lab 01/05/18 0401 01/06/18 0500 01/07/18 0547 01/08/18 0551 01/09/18 0655  NA 137 135 137 136 135  K 3.2* 3.3* 3.4* 3.1* 3.8  CL 104 99 103 99 101  CO2 25 24 24 26 24   GLUCOSE 85 102* 101* 78 91  BUN 6* 5* 11 12 12   CREATININE 0.81 0.85 0.92 0.78 0.77  CALCIUM 8.2* 8.8* 9.3 8.9 9.0  MG 1.5* 2.3  --   --  1.8   Liver Function Tests: Recent Labs  Lab 01/04/18 1428  AST 74*  ALT 16  ALKPHOS 86  BILITOT 0.9  PROT 7.4  ALBUMIN 3.9   Recent Labs  Lab 01/04/18 1428  LIPASE 42   Recent Labs  Lab 01/04/18 1425  AMMONIA 44*   CBC: Recent Labs  Lab 01/04/18 1428 01/05/18 0401 01/06/18 0500 01/09/18 0655  WBC 8.2 5.4 5.9 7.0  NEUTROABS 6.8  --   --   --   HGB 12.6 11.2* 12.8 12.5  HCT 39.3 33.9* 38.9 39.0  MCV 103.4* 100.9* 100.8* 101.3*  PLT 132* 126* 134* 184   Cardiac Enzymes: No results for input(s): CKTOTAL, CKMB, CKMBINDEX, TROPONINI in the last 168 hours. BNP: BNP (last 3 results) No results for input(s): BNP in the last 8760 hours.  ProBNP (last 3 results) No results for input(s): PROBNP in the last 8760 hours.  CBG: No results for input(s): GLUCAP in the last 168 hours.     Signed:  Cristal Ford  [facility] 01/10/2018, 10:05 AM

## 2018-01-10 NOTE — Care Management Note (Signed)
Case Management Note  Patient Details  Name: Terri Ayala MRN: [id number] Date of Birth: 01/22/1956  Subjective/Objective:                    Action/Plan: CM met with the patient yesterday to go over d/c plans. She currently is refusing to d/c to SNF for rehab. She wants to d/c home and in a couple of days go to ETOH rehab. CM provided her the list of rehabs and encouraged her to start calling and trying to get a place.  Pt states she has issues with transportation. She drives short distances but not anywhere else. CM provided her SCAT application and other numbers for transportation assistance.  Pt is trying to get a friend of hers to stay with her after d/c. CM will follow.   Expected Discharge Date:                  Expected Discharge Plan:  Home w Home Health Services  In-House Referral:     Discharge planning Services  CM Consult  Post Acute Care Choice:    Choice offered to:     DME Arranged:    DME Agency:     HH Arranged:    HH Agency:     Status of Service:  In process, will continue to follow  If discussed at Long Length of Stay Meetings, dates discussed:    Additional Comments:   F , RN 01/10/2018, 8:16 AM  

## 2018-01-10 NOTE — Discharge Instructions (Signed)
Alcohol Use Disorder Alcohol use disorder is when your drinking disrupts your daily life. When you have this condition, you drink too much alcohol and you cannot control your drinking. Alcohol use disorder can cause serious problems with your physical health. It can affect your brain, heart, liver, pancreas, immune system, stomach, and intestines. Alcohol use disorder can increase your risk for certain cancers and cause problems with your mental health, such as depression, anxiety, psychosis, delirium, and dementia. People with this disorder risk hurting themselves and others. What are the causes? This condition is caused by drinking too much alcohol over time. It is not caused by drinking too much alcohol only one or two times. Some people with this condition drink alcohol to cope with or escape from negative life events. Others drink to relieve pain or symptoms of mental illness. What increases the risk? You are more likely to develop this condition if:  You have a family history of alcohol use disorder.  Your culture encourages drinking to the point of intoxication, or makes alcohol easy to get.  You had a mood or conduct disorder in childhood.  You have been a victim of abuse.  You are an adolescent and: ? You have poor grades or difficulties in school. ? Your caregivers do not talk to you about saying no to alcohol, or supervise your activities. ? You are impulsive or you have trouble with self-control.  What are the signs or symptoms? Symptoms of this condition include:  Drinkingmore than you want to.  Drinking for longer than you want to.  Trying several times to drink less or to control your drinking.  Spending a lot of time getting alcohol, drinking, or recovering from drinking.  Craving alcohol.  Having problems at work, at school, or at home due to drinking.  Having problems in relationships due to drinking.  Drinking when it is dangerous to drink, such as before  driving a car.  Continuing to drink even though you know you might have a physical or mental problem related to drinking.  Needing more and more alcohol to get the same effect you want from the alcohol (building up tolerance).  Having symptoms of withdrawal when you stop drinking. Symptoms of withdrawal include: ? Fatigue. ? Nightmares. ? Trouble sleeping. ? Depression. ? Anxiety. ? Fever. ? Seizures. ? Severe confusion. ? Feeling or seeing things that are not there (hallucinations). ? Tremors. ? Rapid heart rate. ? Rapid breathing. ? High blood pressure.  Drinking to avoid symptoms of withdrawal.  How is this diagnosed? This condition is diagnosed with an assessment. Your health care provider may start the assessment by asking three or four questions about your drinking. Your health care provider may perform a physical exam or do lab tests to see if you have physical problems resulting from alcohol use. She or he may refer you to a mental health professional for evaluation. How is this treated? Some people with alcohol use disorder are able to reduce their alcohol use to low-risk levels. Others need to completely quit drinking alcohol. When necessary, mental health professionals with specialized training in substance use treatment can help. Your health care provider can help you decide how severe your alcohol use disorder is and what type of treatment you need. The following forms of treatment are available:  Detoxification. Detoxification involves quitting drinking and using prescription medicines within the first week to help lessen withdrawal symptoms. This treatment is important for people who have had withdrawal symptoms before and for  heavy drinkers who are likely to have withdrawal symptoms. Alcohol withdrawal can be dangerous, and in severe cases, it can cause death. Detoxification may be provided in a home, community, or primary care setting, or in a hospital or substance use  treatment facility.  Counseling. This treatment is also called talk therapy. It is provided by substance use treatment counselors. A counselor can address the reasons you use alcohol and suggest ways to keep you from drinking again or to prevent problem drinking. The goals of talk therapy are to: ? Find healthy activities and ways for you to cope with stress. ? Identify and avoid the things that trigger your alcohol use. ? Help you learn how to handle cravings.  Medicines.Medicines can help treat alcohol use disorder by: ? Decreasing alcohol cravings. ? Decreasing the positive feeling you have when you drink alcohol. ? Causing an uncomfortable physical reaction when you drink alcohol (aversion therapy).  Support groups. Support groups are led by people who have quit drinking. They provide emotional support, advice, and guidance.  These forms of treatment are often combined. Some people with this condition benefit from a combination of treatments provided by specialized substance use treatment centers. Follow these instructions at home:  Take over-the-counter and prescription medicines only as told by your health care provider.  Check with your health care provider before starting any new medicines.  Ask friends and family members not to offer you alcohol.  Avoid situations where alcohol is served, including gatherings where others are drinking alcohol.  Create a plan for what to do when you are tempted to use alcohol.  Find hobbies or activities that you enjoy that do not include alcohol.  Keep all follow-up visits as told by your health care provider. This is important. How is this prevented?  If you drink, limit alcohol intake to no more than 1 drink a day for nonpregnant women and 2 drinks a day for men. One drink equals 12 oz of beer, 5 oz of wine, or 1 oz of hard liquor.  If you have a mental health condition, get treatment and support.  Do not give alcohol to  adolescents.  If you are an adolescent: ? Do not drink alcohol. ? Do not be afraid to say no if someone offers you alcohol. Speak up about why you do not want to drink. You can be a positive role model for your friends and set a good example for those around you by not drinking alcohol. ? If your friends drink, spend time with others who do not drink alcohol. Make new friends who do not use alcohol. ? Find healthy ways to manage stress and emotions, such as meditation or deep breathing, exercise, spending time in nature, listening to music, or talking with a trusted friend or family member. Contact a health care provider if:  You are not able to take your medicines as told.  Your symptoms get worse.  You return to drinking alcohol (relapse) and your symptoms get worse. Get help right away if:  You have thoughts about hurting yourself or others. If you ever feel like you may hurt yourself or others, or have thoughts about taking your own life, get help right away. You can go to your nearest emergency department or call:  Your local emergency services (911 in the [place]  A suicide crisis helpline, such as the [facility] at 575 137 8411. This is open 24 hours a day.  Summary  Alcohol use disorder is when your  drinking disrupts your daily life. When you have this condition, you drink too much alcohol and you cannot control your drinking.  Treatment may include detoxification, counseling, medicine, and support groups.  Ask friends and family members not to offer you alcohol. Avoid situations where alcohol is served.  Get help right away if you have thoughts about hurting yourself or others. This information is not intended to replace advice given to you by your health care provider. Make sure you discuss any questions you have with your health care provider. Document Released: 03/24/2004 Document Revised: 11/12/2015 Document Reviewed: 11/12/2015 Elsevier  Interactive Patient Education  2018 [employer].   Alcohol Withdrawal When a person who drinks a lot of alcohol stops drinking, he or she may go through alcohol withdrawal. Alcohol withdrawal causes problems. It can make you feel:  Tired (fatigued).  Sad (depressed).  Fearful (anxious).  Grouchy (irritable).  Not hungry.  Sick to your stomach (nauseous).  Shaky.  It can also make you have:  Nightmares.  Trouble sleeping.  Trouble thinking clearly.  Mood swings.  Clammy skin.  Very bad sweating.  A very fast heartbeat.  Shaking that you cannot control (tremor).  Having a fever.  A fit of movements that you cannot control (seizure).  Confusion.  Throwing up (vomiting).  Feeling or seeing things that are not there (hallucinations).  Follow these instructions at home:  Take medicines and vitamins only as told by your doctor.  Do not drink alcohol.  Have someone around in case you need help.  Drink enough fluid to keep your pee (urine) clear or pale yellow.  Think about joining a group to help you stop drinking. Contact a doctor if:  Your problems get worse.  Your problems do not go away.  You cannot eat or drink without throwing up.  You are having a hard time not drinking alcohol.  You cannot stop drinking alcohol. Get help right away if:  You feel your heart beating differently than usual.  Your chest hurts.  You have trouble breathing.  You have very bad problems, like: ? A fever. ? A fit of movements that you cannot control. ? Being very confused. ? Feeling or seeing things that are not there. This information is not intended to replace advice given to you by your health care provider. Make sure you discuss any questions you have with your health care provider. Document Released: 08/03/2007 Document Revised: 07/23/2015 Document Reviewed: 12/03/2013 Elsevier Interactive Patient Education  2018 [employer].

## 2018-01-12 ENCOUNTER — Other Ambulatory Visit: Payer: Self-pay | Admitting: Sports Medicine

## 2018-01-12 DIAGNOSIS — M779 Enthesopathy, unspecified: Secondary | ICD-10-CM

## 2018-01-12 DIAGNOSIS — M79671 Pain in right foot: Secondary | ICD-10-CM

## 2018-01-19 DIAGNOSIS — F1010 Alcohol abuse, uncomplicated: Secondary | ICD-10-CM | POA: Diagnosis not present

## 2018-01-19 DIAGNOSIS — F419 Anxiety disorder, unspecified: Secondary | ICD-10-CM | POA: Diagnosis not present

## 2018-01-19 DIAGNOSIS — R0789 Other chest pain: Secondary | ICD-10-CM | POA: Diagnosis not present

## 2018-01-19 DIAGNOSIS — M791 Myalgia, unspecified site: Secondary | ICD-10-CM | POA: Diagnosis not present

## 2018-01-30 ENCOUNTER — Encounter (HOSPITAL_COMMUNITY): Payer: Self-pay | Admitting: Emergency Medicine

## 2018-01-30 ENCOUNTER — Emergency Department (HOSPITAL_COMMUNITY): Payer: Federal, State, Local not specified - PPO

## 2018-01-30 ENCOUNTER — Other Ambulatory Visit: Payer: Self-pay

## 2018-01-30 ENCOUNTER — Emergency Department (HOSPITAL_COMMUNITY)
Admission: EM | Admit: 2018-01-30 | Discharge: 2018-01-30 | Disposition: A | Discharge status: Home / Self Care | Payer: Federal, State, Local not specified - PPO | Attending: Emergency Medicine | Admitting: Emergency Medicine

## 2018-01-30 DIAGNOSIS — M542 Cervicalgia: Secondary | ICD-10-CM | POA: Diagnosis not present

## 2018-01-30 DIAGNOSIS — W19XXXA Unspecified fall, initial encounter: Secondary | ICD-10-CM

## 2018-01-30 DIAGNOSIS — I10 Essential (primary) hypertension: Secondary | ICD-10-CM | POA: Insufficient documentation

## 2018-01-30 DIAGNOSIS — S299XXA Unspecified injury of thorax, initial encounter: Secondary | ICD-10-CM | POA: Diagnosis not present

## 2018-01-30 DIAGNOSIS — R51 Headache: Secondary | ICD-10-CM | POA: Insufficient documentation

## 2018-01-30 DIAGNOSIS — R0781 Pleurodynia: Secondary | ICD-10-CM | POA: Diagnosis not present

## 2018-01-30 DIAGNOSIS — I959 Hypotension, unspecified: Secondary | ICD-10-CM | POA: Diagnosis not present

## 2018-01-30 DIAGNOSIS — Z87891 Personal history of nicotine dependence: Secondary | ICD-10-CM | POA: Insufficient documentation

## 2018-01-30 DIAGNOSIS — S199XXA Unspecified injury of neck, initial encounter: Secondary | ICD-10-CM | POA: Diagnosis not present

## 2018-01-30 DIAGNOSIS — M545 Low back pain: Secondary | ICD-10-CM | POA: Diagnosis not present

## 2018-01-30 DIAGNOSIS — Y929 Unspecified place or not applicable: Secondary | ICD-10-CM | POA: Diagnosis not present

## 2018-01-30 DIAGNOSIS — S0990XA Unspecified injury of head, initial encounter: Secondary | ICD-10-CM | POA: Diagnosis not present

## 2018-01-30 DIAGNOSIS — Y999 Unspecified external cause status: Secondary | ICD-10-CM | POA: Insufficient documentation

## 2018-01-30 DIAGNOSIS — Z79899 Other long term (current) drug therapy: Secondary | ICD-10-CM | POA: Diagnosis not present

## 2018-01-30 DIAGNOSIS — R0902 Hypoxemia: Secondary | ICD-10-CM | POA: Diagnosis not present

## 2018-01-30 DIAGNOSIS — Y939 Activity, unspecified: Secondary | ICD-10-CM | POA: Diagnosis not present

## 2018-01-30 DIAGNOSIS — W0110XA Fall on same level from slipping, tripping and stumbling with subsequent striking against unspecified object, initial encounter: Secondary | ICD-10-CM | POA: Insufficient documentation

## 2018-01-30 DIAGNOSIS — R079 Chest pain, unspecified: Secondary | ICD-10-CM | POA: Diagnosis not present

## 2018-01-30 MED ORDER — LORAZEPAM 1 MG PO TABS
1.0000 mg | ORAL_TABLET | Freq: Once | ORAL | Status: AC
  Administered 2018-01-30: 1 mg via ORAL
  Filled 2018-01-30: qty 1

## 2018-01-30 NOTE — ED Provider Notes (Signed)
[facility] EMERGENCY DEPARTMENT Provider Note   CSN: [id number] Arrival date & time: 01/30/18  1400     History   Chief Complaint Chief Complaint  Patient presents with  . Fall    HPI Terri Ayala is a 62 y.o. female.  Patient states that she had a mechanical fall today.  Patient with history of alcohol abuse.  Patient states that she has drank a bottle of wine this morning.  She does not appear clinically intoxicated.  She is well-known to the ED and has heavy drinking history.  She is not interested in quitting.  Multiple falls in the past.  Patient is not on any blood thinners.  She denies any back pain, back pain, belly pain.  No shortness of breath, was able to ambulate after the fall.  Came for evaluation because she continues to have headache.  No lacerations.  The history is provided by the patient.  Fall  This is a new problem. The current episode started 1 to 2 hours ago. The problem has been resolved. Associated symptoms include headaches. Pertinent negatives include no chest pain, no abdominal pain and no shortness of breath. Nothing aggravates the symptoms. Nothing relieves the symptoms. She has tried nothing for the symptoms. The treatment provided no relief.    Past Medical History:  Diagnosis Date  . Alcohol withdrawal ([place]) 11/2017; 01/04/2018  . Alcoholism ([place])   . Anxiety   . Arthritis    "hands" (01/04/2018)  . ASYMPTOMATIC POSTMENOPAUSAL STATUS 08/17/2009  . Chronic lower back pain   . Depression   . DYSPHAGIA 06/19/2007  . Fatty liver, alcoholic   . GERD (gastroesophageal reflux disease)   . GOITER, MULTINODULAR 08/17/2009  . Hyperlipidemia   . Migraines    "not sure what triggers them; I'll have 1 q couple weeks or more; come 2 days in a row when they come" (01/04/2018)  . Mitral valve prolapse   . OSTEOPOROSIS 08/17/2009  . PONV (postoperative nausea and vomiting)   . Supraventricular tachycardia [facility])     Patient Active Problem  List   Diagnosis Date Noted  . Rib fracture 01/04/2018  . Chest pain 12/08/2017  . Leukocytosis 12/08/2017  . Essential hypertension 12/08/2017  . Alcohol withdrawal ([place]) 07/21/2017  . Major depressive disorder, recurrent episode, moderate ([place])   . Alcohol use disorder, severe, dependence ([place]) 04/03/2014  . Lumbosacral radiculopathy at L5 05/20/2013  . Nonspecific elevation of levels of transaminase or lactic acid dehydrogenase (LDH) 03/16/2013  . Hepatic steatosis 03/16/2013  . Chronic cholecystitis 03/14/2013  . Cervical arthritis 10/12/2012  . Palpitations 11/29/2010  . SVT (supraventricular tachycardia) ([place]) 11/29/2010  . Mitral valve prolapse 11/29/2010  . Decreased libido 11/15/2010  . GOITER, MULTINODULAR 08/17/2009  . ANXIETY 08/17/2009  . Migraine headache 08/17/2009  . Hearing loss 08/17/2009  . Osteoporosis 08/17/2009  . ASYMPTOMATIC POSTMENOPAUSAL STATUS 08/17/2009  . DYSPHAGIA 06/19/2007    Past Surgical History:  Procedure Laterality Date  . BREAST BIOPSY Right    "benign"  . CATARACT EXTRACTION W/ INTRAOCULAR LENS  IMPLANT, BILATERAL    . CERVICAL CONE BIOPSY  2000s  . CERVIX LESION DESTRUCTION  1991   "dysplasia; lesions"  . CHOLECYSTECTOMY N/A 03/14/2013   Procedure: LAPAROSCOPIC CHOLECYSTECTOMY WITH ATTEMPTED INTRAOPERATIVE CHOLANGIOGRAM;  Surgeon: Harl Bowie, MD;  Location: [facility];  Service: General;  Laterality: N/A;  . laporoscopic abdominal surgery     "for endometriosis"     OB History   None  Home Medications    Prior to Admission medications   Medication Sig Start Date End Date Taking? Authorizing Provider  alendronate (FOSAMAX) 70 MG tablet Take 70 mg by mouth once a week. Take with a full glass of water on an empty stomach.    [provider]  busPIRone (BUSPAR) 15 MG tablet Take 15 mg by mouth 3 (three) times daily.    [provider]  diclofenac sodium (VOLTAREN) 1 % GEL Apply 2 g topically 4 (four)  times daily. 12/19/17   Tasia Catchings, Amy V, PA-C  DULoxetine (CYMBALTA) 60 MG capsule TAKE 1 CAPSULE (60 MG TOTAL) BY MOUTH DAILY. Patient taking differently: Take 60 mg by mouth daily.  10/23/14   Denita Lung, MD  folic acid (FOLVITE) 1 MG tablet Take 1 tablet (1 mg total) by mouth daily. 01/10/18   Mikhail, Velta Addison, DO  gabapentin (NEURONTIN) 300 MG capsule Take 1 capsule (300 mg total) by mouth 2 (two) times daily. 12/10/17   Black, Lezlie Octave, NP  hydrOXYzine (ATARAX/VISTARIL) 25 MG tablet Take 25 mg by mouth 3 (three) times daily as needed for anxiety.    [provider]  lidocaine (LIDODERM) 5 % Place 1 patch onto the skin daily. Remove & Discard patch within 12 hours or as directed by MD 12/19/17   Ok Edwards, PA-C  Multiple Vitamin (MULTIVITAMIN WITH MINERALS) TABS tablet Take 1 tablet by mouth daily. 01/10/18   Mikhail, Velta Addison, DO  norethindrone-ethinyl estradiol (FEMHRT 1/5) 1-5 MG-MCG TABS Take 1 tablet by mouth daily. For Osteoporosis 04/07/14   Lindell Spar I, NP  ondansetron (ZOFRAN ODT) 4 MG disintegrating tablet Take 1 tablet (4 mg total) by mouth every 8 (eight) hours as needed for nausea or vomiting. 09/01/17   Henson, Vickie L, NP-C  pantoprazole (PROTONIX) 40 MG tablet Take 1 tablet (40 mg total) by mouth daily at 12 noon. 11/09/17   Ghimire, Henreitta Leber, MD  propranolol (INDERAL) 10 MG tablet Take 20 mg by mouth 2 (two) times daily.     [provider]  SUMAtriptan (IMITREX) 100 MG tablet Take 1 tablet at the first sign of migraine.  May repeat in 2 hours. Patient taking differently: Take 100 mg by mouth every 2 (two) hours as needed for migraine.  07/31/17   Denita Lung, MD  thiamine 100 MG tablet Take 1 tablet (100 mg total) by mouth daily. 11/09/17   Ghimire, Henreitta Leber, MD  traZODone (DESYREL) 50 MG tablet Take 50 mg by mouth at bedtime as needed for sleep.     [provider]    Family History Family History  Problem Relation Age of Onset  . Colon cancer  Father        Deceased, 77  . Osteoporosis Mother        Living, 79  . Healthy Brother   . Thyroid disease Neg Hx   . Goiter Neg Hx     Social History Social History   Tobacco Use  . Smoking status: Former Research scientist (life sciences)  . Smokeless tobacco: Never Used  . Tobacco comment: 01/04/2018 "smoked when I was a teenager"  Substance Use Topics  . Alcohol use: Yes    Alcohol/week: 63.0 standard drinks    Types: 63 Glasses of wine per week    Comment: 01/04/2018 "1 1/2 bottles of wine/day"  . Drug use: Never     Allergies   Doxycycline; Codeine; and Tetracycline hcl   Review of Systems Review of Systems  Constitutional: Negative  for chills and fever.  HENT: Negative for ear pain and sore throat.   Eyes: Negative for pain and visual disturbance.  Respiratory: Negative for cough and shortness of breath.   Cardiovascular: Negative for chest pain and palpitations.  Gastrointestinal: Negative for abdominal pain and vomiting.  Genitourinary: Negative for dysuria and hematuria.  Musculoskeletal: Negative for arthralgias and back pain.  Skin: Negative for color change and rash.  Neurological: Positive for headaches. Negative for seizures and syncope.  All other systems reviewed and are negative.    Physical Exam Updated Vital Signs  ED Triage Vitals  Enc Vitals Group     BP 01/30/18 1411 134/88     Pulse Rate 01/30/18 1411 100     Resp 01/30/18 1411 16     Temp 01/30/18 1411 98.2 F (36.8 C)     Temp Source 01/30/18 1411 Oral     SpO2 01/30/18 1411 100 %     Weight 01/30/18 1406 149 lb 14.6 oz (68 kg)     Height 01/30/18 1406 5\' 2"  (1.575 m)     Head Circumference --      Peak Flow --      Pain Score 01/30/18 1406 8     Pain Loc --      Pain Edu? --      Excl. in [place]? --     Physical Exam  Constitutional: She is oriented to person, place, and time. She appears well-developed and well-nourished. No distress.  HENT:  Head: Normocephalic and atraumatic.  Eyes: Pupils are equal,  round, and reactive to light. Conjunctivae and EOM are normal.  Neck: Normal range of motion. Neck supple.  Cardiovascular: Normal rate, regular rhythm, normal heart sounds and intact distal pulses.  No murmur heard. Pulmonary/Chest: Effort normal and breath sounds normal. No respiratory distress.  Abdominal: Soft. There is no tenderness.  Musculoskeletal: Normal range of motion. She exhibits no edema.  No midline spinal tenderness on exam  Neurological: She is alert and oriented to person, place, and time. No cranial nerve deficit or sensory deficit. She exhibits normal muscle tone. Coordination normal.  Patient with normal gait, 5+ out of 5 strength throughout, normal sensation, no drift, normal finger-to-nose finger  Skin: Skin is warm and dry. Capillary refill takes less than 2 seconds.  Psychiatric: She has a normal mood and affect.  Nursing note and vitals reviewed.    ED Treatments / Results  Labs (all labs ordered are listed, but only abnormal results are displayed) Labs Reviewed - No data to display  EKG None  Radiology Dg Chest 2 View  Result Date: 01/30/2018 CLINICAL DATA:  Pain after fall EXAM: CHEST - 2 VIEW COMPARISON:  January 04, 2018 FINDINGS: Mild increased interstitial markings in the lungs. The heart, hila, mediastinum, lungs, and pleura are otherwise unremarkable. IMPRESSION: Mild increased interstitial markings in the lungs may represent pulmonary venous congestion/mild edema versus bronchitic change. No other acute abnormalities. Electronically Signed   By: Dorise Bullion III M.D   On: 01/30/2018 15:13   Ct Head Wo Contrast  Result Date: 01/30/2018 CLINICAL DATA:  Pain after trauma EXAM: CT HEAD WITHOUT CONTRAST CT CERVICAL SPINE WITHOUT CONTRAST TECHNIQUE: Multidetector CT imaging of the head and cervical spine was performed following the standard protocol without intravenous contrast. Multiplanar CT image reconstructions of the cervical spine were also  generated. COMPARISON:  CT of the brain June 12, 2014 FINDINGS: CT HEAD FINDINGS Brain: There is a calcification in the left cerebellar hemisphere,  unchanged since 2016. No subdural, epidural, or subarachnoid hemorrhage identified. Cerebellum, brainstem, and basal cisterns are otherwise normal. Ventricles are prominent but stable. Sulci are prominent but stable. No acute cortical ischemia or infarct. No mass effect or midline shift. Vascular: No hyperdense vessel or unexpected calcification. Skull: Normal. Negative for fracture or focal lesion. Sinuses/Orbits: No acute finding. Other: None. CT CERVICAL SPINE FINDINGS Alignment: 2 mm of anterolisthesis of C4 versus C5. No other malalignment. Skull base and vertebrae: No acute fracture. No primary bone lesion or focal pathologic process. Soft tissues and spinal canal: No prevertebral fluid or swelling. No visible canal hematoma. Disc levels:  Degenerative changes most marked at C6-7. Upper chest: Negative. Other: No other abnormalities. IMPRESSION: 1. No acute intracranial abnormalities. 2. 2 mm anterolisthesis of C4 versus C5 is likely degenerative in nature. No fracture or traumatic malalignment noted. Electronically Signed   By: Dorise Bullion III M.D   On: 01/30/2018 15:43   Ct Cervical Spine Wo Contrast  Result Date: 01/30/2018 CLINICAL DATA:  Pain after trauma EXAM: CT HEAD WITHOUT CONTRAST CT CERVICAL SPINE WITHOUT CONTRAST TECHNIQUE: Multidetector CT imaging of the head and cervical spine was performed following the standard protocol without intravenous contrast. Multiplanar CT image reconstructions of the cervical spine were also generated. COMPARISON:  CT of the brain June 12, 2014 FINDINGS: CT HEAD FINDINGS Brain: There is a calcification in the left cerebellar hemisphere, unchanged since 2016. No subdural, epidural, or subarachnoid hemorrhage identified. Cerebellum, brainstem, and basal cisterns are otherwise normal. Ventricles are prominent but  stable. Sulci are prominent but stable. No acute cortical ischemia or infarct. No mass effect or midline shift. Vascular: No hyperdense vessel or unexpected calcification. Skull: Normal. Negative for fracture or focal lesion. Sinuses/Orbits: No acute finding. Other: None. CT CERVICAL SPINE FINDINGS Alignment: 2 mm of anterolisthesis of C4 versus C5. No other malalignment. Skull base and vertebrae: No acute fracture. No primary bone lesion or focal pathologic process. Soft tissues and spinal canal: No prevertebral fluid or swelling. No visible canal hematoma. Disc levels:  Degenerative changes most marked at C6-7. Upper chest: Negative. Other: No other abnormalities. IMPRESSION: 1. No acute intracranial abnormalities. 2. 2 mm anterolisthesis of C4 versus C5 is likely degenerative in nature. No fracture or traumatic malalignment noted. Electronically Signed   By: Dorise Bullion III M.D   On: 01/30/2018 15:43    Procedures Procedures (including critical care time)  Medications Ordered in ED Medications  LORazepam (ATIVAN) tablet 1 mg (1 mg Oral Given 01/30/18 1421)     Initial Impression / Assessment and Plan / ED Course  I have reviewed the triage vital signs and the nursing notes.  Pertinent labs & imaging results that were available during my care of the patient were reviewed by me and considered in my medical decision making (see chart for details).     SYANN CUPPLES is a 62 year old female who presents to the ED after a fall.  Patient with history of alcohol abuse.  States that she was drinking this morning.  Clinically patient appears sober.  She is a heavy drinker.  She has no desire to quit.  Multiple falls in the past due to alcohol use.  Patient is not on any blood thinners.  She has mild headache.  No neck pain, no back pain, no shortness of breath, was able to ambulate after the fall.  She is overall well-appearing.  No lacerations.  Patient states that she feels like she was withdrawn and  will give Ativan.  No signs of alcohol withdrawal on exam.  CT of the head and neck were performed and were unremarkable.  Chest x-ray showed no acute findings.  Some mild congestion that appears chronic.  No rib fractures.  Patient was able to ambulate without any issues and was discharged from ED in good condition.  Counseled her on alcohol abuse.  Given information to follow-up with rehab she desires.  This chart was dictated using voice recognition software.  Despite best efforts to proofread,  errors can occur which can change the documentation meaning.  Final Clinical Impressions(s) / ED Diagnoses   Final diagnoses:  Fall, initial encounter    ED Discharge Orders    None       Lennice Sites, DO 01/30/18 1551

## 2018-01-30 NOTE — ED Notes (Signed)
Pt ambulated in room with 2 person assist, normally uses a walker.

## 2018-01-30 NOTE — ED Notes (Signed)
Pt to CT

## 2018-01-30 NOTE — ED Triage Notes (Signed)
Pt to ED via EMS from home with fall onto hardwood floor after drinking. C/o lower back pain, right ribs hurting with known right rib fractures from previous fall. Denies LOC, no blood thinners. Pt has home health RN, who told patient to call EMS. Pt ambulatory since fall. Alert, oriented x4, bp 117/74, pulse 84, rr16, 98% RA, bs 108.

## 2018-02-05 NOTE — Progress Notes (Deleted)
Subjective:    Patient ID: Terri Ayala, female    DOB: 18-Feb-1956, 62 y.o.   MRN: [id number]  HPI:  Terri Ayala is here to establish as a new pt.  She is a pleasant 62 year old female. PMH:HTN, MVP, Migraine, Alcohol Use Disorder- severe dependence, hearing loss, leukocytosis, chronic cholecystitis, hepatic steatosis Depression, anxiety, and insomnia   Patient Care Team    Relationship Specialty Notifications Start End  Patient, No Pcp Per PCP - [facility]  01/04/18    Comment: "Terri Ayala's office dropped me"    Patient Active Problem List   Diagnosis Date Noted  . Rib fracture 01/04/2018  . Chest pain 12/08/2017  . Leukocytosis 12/08/2017  . Essential hypertension 12/08/2017  . Alcohol withdrawal ([place]) 07/21/2017  . Major depressive disorder, recurrent episode, moderate ([place])   . Alcohol use disorder, severe, dependence ([place]) 04/03/2014  . Lumbosacral radiculopathy at L5 05/20/2013  . Nonspecific elevation of levels of transaminase or lactic acid dehydrogenase (LDH) 03/16/2013  . Hepatic steatosis 03/16/2013  . Chronic cholecystitis 03/14/2013  . Cervical arthritis 10/12/2012  . Palpitations 11/29/2010  . SVT (supraventricular tachycardia) ([place]) 11/29/2010  . Mitral valve prolapse 11/29/2010  . Decreased libido 11/15/2010  . GOITER, MULTINODULAR 08/17/2009  . ANXIETY 08/17/2009  . Migraine headache 08/17/2009  . Hearing loss 08/17/2009  . Osteoporosis 08/17/2009  . ASYMPTOMATIC POSTMENOPAUSAL STATUS 08/17/2009  . DYSPHAGIA 06/19/2007     Past Medical History:  Diagnosis Date  . Alcohol withdrawal ([place]) 11/2017; 01/04/2018  . Alcoholism ([place])   . Anxiety   . Arthritis    "hands" (01/04/2018)  . ASYMPTOMATIC POSTMENOPAUSAL STATUS 08/17/2009  . Chronic lower back pain   . Depression   . DYSPHAGIA 06/19/2007  . Fatty liver, alcoholic   . GERD (gastroesophageal reflux disease)   . GOITER, MULTINODULAR 08/17/2009  . Hyperlipidemia   . Migraines    "not  sure what triggers them; I'll have 1 q couple weeks or more; come 2 days in a row when they come" (01/04/2018)  . Mitral valve prolapse   . OSTEOPOROSIS 08/17/2009  . PONV (postoperative nausea and vomiting)   . Supraventricular tachycardia [facility])      Past Surgical History:  Procedure Laterality Date  . BREAST BIOPSY Right    "benign"  . CATARACT EXTRACTION W/ INTRAOCULAR LENS  IMPLANT, BILATERAL    . CERVICAL CONE BIOPSY  2000s  . CERVIX LESION DESTRUCTION  1991   "dysplasia; lesions"  . CHOLECYSTECTOMY N/A 03/14/2013   Procedure: LAPAROSCOPIC CHOLECYSTECTOMY WITH ATTEMPTED INTRAOPERATIVE CHOLANGIOGRAM;  Surgeon: Harl Bowie, MD;  Location: [place];  Service: General;  Laterality: N/A;  . laporoscopic abdominal surgery     "for endometriosis"     Family History  Problem Relation Age of Onset  . Colon cancer Father        Deceased, 34  . Osteoporosis Mother        Living, 25  . Healthy Brother   . Thyroid disease Neg Hx   . Goiter Neg Hx      Social History   Substance and Sexual Activity  Drug Use Never     Social History   Substance and Sexual Activity  Alcohol Use Yes  . Alcohol/week: 63.0 standard drinks  . Types: 63 Glasses of wine per week   Comment: 01/04/2018 "1 1/2 bottles of wine/day"     Social History   Tobacco Use  Smoking Status Former Smoker  Smokeless Tobacco Never Used  Tobacco  Comment   01/04/2018 "smoked when I was a teenager"     Outpatient Encounter Medications as of 02/06/2018  Medication Sig Note  . alendronate (FOSAMAX) 70 MG tablet Take 70 mg by mouth once a week. Take with a full glass of water on an empty stomach. 01/04/2018: LF 12-31-17 84DS  . busPIRone (BUSPAR) 15 MG tablet Take 15 mg by mouth 3 (three) times daily. 01/04/2018: LF 12-06-17 30DS  . diclofenac sodium (VOLTAREN) 1 % GEL Apply 2 g topically 4 (four) times daily. 01/04/2018: LF 12-20-17 14DS  . DULoxetine (CYMBALTA) 60 MG capsule TAKE 1 CAPSULE (60 MG TOTAL) BY  MOUTH DAILY. (Patient taking differently: Take 60 mg by mouth daily. ) 01/04/2018: LF 11-28-17 30DS  . folic acid (FOLVITE) 1 MG tablet Take 1 tablet (1 mg total) by mouth daily.   [name] Kitchen gabapentin (NEURONTIN) 300 MG capsule Take 1 capsule (300 mg total) by mouth 2 (two) times daily. 01/04/2018: LF 12-11-17 30DS  . hydrOXYzine (ATARAX/VISTARIL) 25 MG tablet Take 25 mg by mouth 3 (three) times daily as needed for anxiety.   . lidocaine (LIDODERM) 5 % Place 1 patch onto the skin daily. Remove & Discard patch within 12 hours or as directed by MD 01/04/2018: LF 12-20-17 30DS  . Multiple Vitamin (MULTIVITAMIN WITH MINERALS) TABS tablet Take 1 tablet by mouth daily.   . norethindrone-ethinyl estradiol (FEMHRT 1/5) 1-5 MG-MCG TABS Take 1 tablet by mouth daily. For Osteoporosis 01/04/2018: LF 10-12-17 84DS  . ondansetron (ZOFRAN ODT) 4 MG disintegrating tablet Take 1 tablet (4 mg total) by mouth every 8 (eight) hours as needed for nausea or vomiting.   . pantoprazole (PROTONIX) 40 MG tablet Take 1 tablet (40 mg total) by mouth daily at 12 noon.   . propranolol (INDERAL) 10 MG tablet Take 20 mg by mouth 2 (two) times daily.    . SUMAtriptan (IMITREX) 100 MG tablet Take 1 tablet at the first sign of migraine.  May repeat in 2 hours. (Patient taking differently: Take 100 mg by mouth every 2 (two) hours as needed for migraine. ) 01/04/2018: LF 10-11-17 QTY10  . thiamine 100 MG tablet Take 1 tablet (100 mg total) by mouth daily.   . traZODone (DESYREL) 50 MG tablet Take 50 mg by mouth at bedtime as needed for sleep.  01/04/2018: LF 11-03-17 90DS   No facility-administered encounter medications on file as of 02/06/2018.     Allergies: Doxycycline; Codeine; and Tetracycline hcl  There is no height or weight on file to calculate BMI.  There were no vitals taken for this visit.     Review of Systems     Objective:   Physical Exam        Assessment & Plan:  No diagnosis found.  No problem-specific Assessment  & Plan notes found for this encounter.    FOLLOW-UP:  No follow-ups on file.

## 2018-02-06 ENCOUNTER — Ambulatory Visit: Payer: Federal, State, Local not specified - PPO | Admitting: Adult Health

## 2018-02-06 DIAGNOSIS — F411 Generalized anxiety disorder: Secondary | ICD-10-CM | POA: Diagnosis not present

## 2018-02-06 DIAGNOSIS — I10 Essential (primary) hypertension: Secondary | ICD-10-CM | POA: Diagnosis not present

## 2018-02-08 ENCOUNTER — Other Ambulatory Visit: Payer: Self-pay | Admitting: Internal Medicine

## 2018-02-08 DIAGNOSIS — Z1231 Encounter for screening mammogram for malignant neoplasm of breast: Secondary | ICD-10-CM

## 2018-02-16 DIAGNOSIS — F419 Anxiety disorder, unspecified: Secondary | ICD-10-CM | POA: Diagnosis not present

## 2018-02-16 DIAGNOSIS — M791 Myalgia, unspecified site: Secondary | ICD-10-CM | POA: Diagnosis not present

## 2018-02-16 DIAGNOSIS — I10 Essential (primary) hypertension: Secondary | ICD-10-CM | POA: Diagnosis not present

## 2018-02-16 DIAGNOSIS — F1010 Alcohol abuse, uncomplicated: Secondary | ICD-10-CM | POA: Diagnosis not present

## 2018-02-22 ENCOUNTER — Emergency Department (HOSPITAL_COMMUNITY)
Admission: EM | Admit: 2018-02-22 | Discharge: 2018-02-22 | Disposition: A | Discharge status: Home / Self Care | Payer: Federal, State, Local not specified - PPO | Attending: Emergency Medicine | Admitting: Emergency Medicine

## 2018-02-22 ENCOUNTER — Other Ambulatory Visit: Payer: Self-pay

## 2018-02-22 ENCOUNTER — Emergency Department (HOSPITAL_COMMUNITY): Payer: Federal, State, Local not specified - PPO

## 2018-02-22 DIAGNOSIS — R251 Tremor, unspecified: Secondary | ICD-10-CM | POA: Diagnosis not present

## 2018-02-22 DIAGNOSIS — R0602 Shortness of breath: Secondary | ICD-10-CM | POA: Insufficient documentation

## 2018-02-22 DIAGNOSIS — F1010 Alcohol abuse, uncomplicated: Secondary | ICD-10-CM

## 2018-02-22 DIAGNOSIS — R079 Chest pain, unspecified: Secondary | ICD-10-CM | POA: Diagnosis not present

## 2018-02-22 DIAGNOSIS — Z79899 Other long term (current) drug therapy: Secondary | ICD-10-CM | POA: Diagnosis not present

## 2018-02-22 DIAGNOSIS — R0600 Dyspnea, unspecified: Secondary | ICD-10-CM | POA: Diagnosis not present

## 2018-02-22 DIAGNOSIS — R110 Nausea: Secondary | ICD-10-CM | POA: Diagnosis not present

## 2018-02-22 DIAGNOSIS — R0789 Other chest pain: Secondary | ICD-10-CM

## 2018-02-22 DIAGNOSIS — F19939 Other psychoactive substance use, unspecified with withdrawal, unspecified: Secondary | ICD-10-CM | POA: Diagnosis not present

## 2018-02-22 LAB — CBC
HCT: 38.3 % (ref 36.0–46.0)
Hemoglobin: 12.3 g/dL (ref 12.0–15.0)
MCH: 32.8 pg (ref 26.0–34.0)
MCHC: 32.1 g/dL (ref 30.0–36.0)
MCV: 102.1 fL — ABNORMAL HIGH (ref 80.0–100.0)
Platelets: 113 10*3/uL — ABNORMAL LOW (ref 150–400)
RBC: 3.75 MIL/uL — ABNORMAL LOW (ref 3.87–5.11)
RDW: 13.1 % (ref 11.5–15.5)
WBC: 6.8 10*3/uL (ref 4.0–10.5)
nRBC: 0 % (ref 0.0–0.2)

## 2018-02-22 LAB — I-STAT TROPONIN, ED
Troponin i, poc: 0.01 ng/mL (ref 0.00–0.08)
Troponin i, poc: 0 ng/mL (ref 0.00–0.08)

## 2018-02-22 LAB — BASIC METABOLIC PANEL
Anion gap: 16 — ABNORMAL HIGH (ref 5–15)
BUN: 5 mg/dL — ABNORMAL LOW (ref 8–23)
CO2: 20 mmol/L — ABNORMAL LOW (ref 22–32)
Calcium: 8.7 mg/dL — ABNORMAL LOW (ref 8.9–10.3)
Chloride: 102 mmol/L (ref 98–111)
Creatinine, Ser: 0.75 mg/dL (ref 0.44–1.00)
GFR calc Af Amer: >60 mL/min (ref >60)
GLUCOSE: 135 mg/dL — AB (ref 70–99)
Potassium: 4.3 mmol/L (ref 3.5–5.1)
Sodium: 138 mmol/L (ref 135–145)

## 2018-02-22 LAB — ETHANOL: Alcohol, Ethyl (B): <10 mg/dL (ref <10)

## 2018-02-22 MED ORDER — THIAMINE HCL 100 MG/ML IJ SOLN
100.0000 mg | Freq: Every day | INTRAMUSCULAR | Status: DC
  Administered 2018-02-22: 100 mg via INTRAVENOUS
  Filled 2018-02-22: qty 2

## 2018-02-22 MED ORDER — LORAZEPAM 2 MG/ML IJ SOLN: 1.0000 mg | Freq: Once | INTRAMUSCULAR | Status: DC

## 2018-02-22 MED ORDER — LORAZEPAM 1 MG PO TABS: 0.0000 mg | ORAL_TABLET | Freq: Four times a day (QID) | ORAL | Status: DC

## 2018-02-22 MED ORDER — CHLORDIAZEPOXIDE HCL 25 MG PO CAPS: ORAL_CAPSULE | ORAL | 0 refills | Status: DC

## 2018-02-22 MED ORDER — ADULT MULTIVITAMIN W/MINERALS CH
1.0000 | ORAL_TABLET | Freq: Every day | ORAL | Status: DC
  Administered 2018-02-22: 1 via ORAL
  Filled 2018-02-22: qty 1

## 2018-02-22 MED ORDER — VITAMIN B-1 100 MG PO TABS: 100.0000 mg | ORAL_TABLET | Freq: Every day | ORAL | Status: DC

## 2018-02-22 MED ORDER — LORAZEPAM 2 MG/ML IJ SOLN: 0.0000 mg | Freq: Two times a day (BID) | INTRAMUSCULAR | Status: DC

## 2018-02-22 MED ORDER — SODIUM CHLORIDE 0.9 % IV BOLUS
1000.0000 mL | Freq: Once | INTRAVENOUS | Status: AC
  Administered 2018-02-22: 1000 mL via INTRAVENOUS

## 2018-02-22 MED ORDER — LORAZEPAM 2 MG/ML IJ SOLN
0.0000 mg | Freq: Four times a day (QID) | INTRAMUSCULAR | Status: DC
  Administered 2018-02-22: 2 mg via INTRAVENOUS
  Filled 2018-02-22: qty 1

## 2018-02-22 MED ORDER — FOLIC ACID 1 MG PO TABS: 1.0000 mg | ORAL_TABLET | Freq: Every day | ORAL | Status: DC

## 2018-02-22 MED ORDER — LORAZEPAM 1 MG PO TABS: 0.0000 mg | ORAL_TABLET | Freq: Two times a day (BID) | ORAL | Status: DC

## 2018-02-22 NOTE — ED Provider Notes (Signed)
[facility] EMERGENCY DEPARTMENT Provider Note   CSN: [id number] Arrival date & time: 02/22/18  2952     History   Chief Complaint Chief Complaint  Patient presents with  . Chest Pain    HPI Terri Ayala is a 62 y.o. female with a past medical history of GERD, alcohol abuse (drinks a 24 ounce bottle of wine daily), SVT who presents to ED for complaints of alcohol withdrawals.  States that her last drink was greater than 24 hours ago.  She started having tremors, chest pressure, nausea and shortness of breath since last night.  She is unable to tell me exactly how long the symptoms have been going on for, as she repeatedly is just stating "I'm not sure, I'm just withdrawing."  States that the chest pressure has improved with aspirin and nitroglycerin given by EMS.  Unable to tell me if this has been constant since it began, or location of pain. Reports history of similar symptoms in the past when she has withdrawals.  She denies any injuries or falls, cough, URI symptoms, fever.  She is requesting Ativan.  HPI  Past Medical History:  Diagnosis Date  . Alcohol withdrawal ([place]) 11/2017; 01/04/2018  . Alcoholism ([place])   . Anxiety   . Arthritis    "hands" (01/04/2018)  . ASYMPTOMATIC POSTMENOPAUSAL STATUS 08/17/2009  . Chronic lower back pain   . Depression   . DYSPHAGIA 06/19/2007  . Fatty liver, alcoholic   . GERD (gastroesophageal reflux disease)   . GOITER, MULTINODULAR 08/17/2009  . Hyperlipidemia   . Migraines    "not sure what triggers them; I'll have 1 q couple weeks or more; come 2 days in a row when they come" (01/04/2018)  . Mitral valve prolapse   . OSTEOPOROSIS 08/17/2009  . PONV (postoperative nausea and vomiting)   . Supraventricular tachycardia [facility])     Patient Active Problem List   Diagnosis Date Noted  . Rib fracture 01/04/2018  . Chest pain 12/08/2017  . Leukocytosis 12/08/2017  . Essential hypertension 12/08/2017  . Alcohol withdrawal  ([place]) 07/21/2017  . Major depressive disorder, recurrent episode, moderate ([place])   . Alcohol use disorder, severe, dependence ([place]) 04/03/2014  . Lumbosacral radiculopathy at L5 05/20/2013  . Nonspecific elevation of levels of transaminase or lactic acid dehydrogenase (LDH) 03/16/2013  . Hepatic steatosis 03/16/2013  . Chronic cholecystitis 03/14/2013  . Cervical arthritis 10/12/2012  . Palpitations 11/29/2010  . SVT (supraventricular tachycardia) ([place]) 11/29/2010  . Mitral valve prolapse 11/29/2010  . Decreased libido 11/15/2010  . GOITER, MULTINODULAR 08/17/2009  . ANXIETY 08/17/2009  . Migraine headache 08/17/2009  . Hearing loss 08/17/2009  . Osteoporosis 08/17/2009  . ASYMPTOMATIC POSTMENOPAUSAL STATUS 08/17/2009  . DYSPHAGIA 06/19/2007    Past Surgical History:  Procedure Laterality Date  . BREAST BIOPSY Right    "benign"  . CATARACT EXTRACTION W/ INTRAOCULAR LENS  IMPLANT, BILATERAL    . CERVICAL CONE BIOPSY  2000s  . CERVIX LESION DESTRUCTION  1991   "dysplasia; lesions"  . CHOLECYSTECTOMY N/A 03/14/2013   Procedure: LAPAROSCOPIC CHOLECYSTECTOMY WITH ATTEMPTED INTRAOPERATIVE CHOLANGIOGRAM;  Surgeon: Harl Bowie, MD;  Location: [facility];  Service: General;  Laterality: N/A;  . laporoscopic abdominal surgery     "for endometriosis"     OB History   No obstetric history on file.      Home Medications    Prior to Admission medications   Medication Sig Start Date End Date Taking? Authorizing Provider  alendronate (  FOSAMAX) 70 MG tablet Take 70 mg by mouth once a week. Take with a full glass of water on an empty stomach.   Yes [provider]  aspirin EC 81 MG tablet Take 81 mg by mouth daily.   Yes [provider]  busPIRone (BUSPAR) 15 MG tablet Take 15 mg by mouth 3 (three) times daily.   Yes [provider]  diclofenac sodium (VOLTAREN) 1 % GEL Apply 2 g topically 4 (four) times daily. 12/19/17  Yes Yu, Amy V, PA-C  DULoxetine  (CYMBALTA) 60 MG capsule TAKE 1 CAPSULE (60 MG TOTAL) BY MOUTH DAILY. Patient taking differently: Take 60 mg by mouth daily.  10/23/14  Yes Denita Lung, MD  folic acid (FOLVITE) 1 MG tablet Take 1 tablet (1 mg total) by mouth daily. 01/10/18  Yes Mikhail, Maryann, DO  gabapentin (NEURONTIN) 300 MG capsule Take 1 capsule (300 mg total) by mouth 2 (two) times daily. 12/10/17  Yes Black, Lezlie Octave, NP  hydrOXYzine (ATARAX/VISTARIL) 25 MG tablet Take 25 mg by mouth 3 (three) times daily as needed for anxiety.   Yes [provider]  lidocaine (LIDODERM) 5 % Place 1 patch onto the skin daily. Remove & Discard patch within 12 hours or as directed by MD 12/19/17  Yes Yu, Amy V, PA-C  Multiple Vitamin (MULTIVITAMIN WITH MINERALS) TABS tablet Take 1 tablet by mouth daily. 01/10/18  Yes Mikhail, [place], DO  norethindrone-ethinyl estradiol (FEMHRT 1/5) 1-5 MG-MCG TABS Take 1 tablet by mouth daily. For Osteoporosis 04/07/14  Yes Lindell Spar I, NP  ondansetron (ZOFRAN ODT) 4 MG disintegrating tablet Take 1 tablet (4 mg total) by mouth every 8 (eight) hours as needed for nausea or vomiting. 09/01/17  Yes Henson, Vickie L, NP-C  pantoprazole (PROTONIX) 40 MG tablet Take 1 tablet (40 mg total) by mouth daily at 12 noon. Patient taking differently: Take 40 mg by mouth daily as needed (heartburn).  11/09/17  Yes Ghimire, Henreitta Leber, MD  propranolol (INDERAL) 10 MG tablet Take 20 mg by mouth 2 (two) times daily.    Yes [provider]  SUMAtriptan (IMITREX) 100 MG tablet Take 1 tablet at the first sign of migraine.  May repeat in 2 hours. Patient taking differently: Take 100 mg by mouth every 2 (two) hours as needed for migraine.  07/31/17  Yes Denita Lung, MD  thiamine 100 MG tablet Take 1 tablet (100 mg total) by mouth daily. 11/09/17  Yes Ghimire, Henreitta Leber, MD  traZODone (DESYREL) 50 MG tablet Take 50 mg by mouth at bedtime as needed for sleep.    Yes [provider]  chlordiazePOXIDE  (LIBRIUM) 25 MG capsule 50mg  PO TID x 1D, then 25-50mg  PO BID X 1D, then 25-50mg  PO QD X 1D 02/22/18   Huberta Tompkins, PA-C    Family History Family History  Problem Relation Age of Onset  . Colon cancer Father        Deceased, 69  . Osteoporosis Mother        Living, 31  . Healthy Brother   . Thyroid disease Neg Hx   . Goiter Neg Hx     Social History Social History   Tobacco Use  . Smoking status: Former Research scientist (life sciences)  . Smokeless tobacco: Never Used  . Tobacco comment: 01/04/2018 "smoked when I was a teenager"  Substance Use Topics  . Alcohol use: Yes    Alcohol/week: 63.0 standard drinks    Types: 63 Glasses of wine per week  Comment: 01/04/2018 "1 1/2 bottles of wine/day"  . Drug use: Never     Allergies   Doxycycline; Codeine; and Tetracycline hcl   Review of Systems Review of Systems  Constitutional: Negative for appetite change, chills and fever.  HENT: Negative for ear pain, rhinorrhea, sneezing and sore throat.   Eyes: Negative for photophobia and visual disturbance.  Respiratory: Positive for shortness of breath. Negative for cough, chest tightness and wheezing.   Cardiovascular: Positive for chest pain. Negative for palpitations.  Gastrointestinal: Positive for nausea. Negative for abdominal pain, blood in stool, constipation, diarrhea and vomiting.  Genitourinary: Negative for dysuria, hematuria and urgency.  Musculoskeletal: Negative for myalgias.  Skin: Negative for rash.  Neurological: Positive for tremors. Negative for dizziness, weakness and light-headedness.     Physical Exam Updated Vital Signs BP 134/79   Pulse 84   Temp 97.9 F (36.6 C) (Oral)   Resp 16   Ht 5\' 2"  (1.575 m)   Wt 63.5 kg   SpO2 100%   BMI 25.61 kg/m   Physical Exam Vitals signs and nursing note reviewed.  Constitutional:      General: She is not in acute distress.    Appearance: She is well-developed.  HENT:     Head: Normocephalic and atraumatic.     Nose: Nose  normal.  Eyes:     General: No scleral icterus.       Left eye: No discharge.     Conjunctiva/sclera: Conjunctivae normal.  Neck:     Musculoskeletal: Normal range of motion and neck supple.  Cardiovascular:     Rate and Rhythm: Normal rate and regular rhythm.     Heart sounds: Normal heart sounds. No murmur. No friction rub. No gallop.   Pulmonary:     Effort: Pulmonary effort is normal. No respiratory distress.     Breath sounds: Normal breath sounds.  Abdominal:     General: Bowel sounds are normal. There is no distension.     Palpations: Abdomen is soft.     Tenderness: There is no abdominal tenderness. There is no guarding.  Musculoskeletal: Normal range of motion.  Skin:    General: Skin is warm and dry.     Findings: No rash.  Neurological:     General: No focal deficit present.     Mental Status: She is alert.     Cranial Nerves: No cranial nerve deficit.     Motor: Tremor present. No weakness or abnormal muscle tone.     Coordination: Coordination normal.     Comments: Coarse tremors noted of bilateral upper extremities.  This will intermittently improve when she is speaking to me.      ED Treatments / Results  Labs (all labs ordered are listed, but only abnormal results are displayed) Labs Reviewed  BASIC METABOLIC PANEL - Abnormal; Notable for the following components:      Result Value   CO2 20 (*)    Glucose, Bld 135 (*)    BUN 5 (*)    Calcium 8.7 (*)    Anion gap 16 (*)    All other components within normal limits  CBC - Abnormal; Notable for the following components:   RBC 3.75 (*)    MCV 102.1 (*)    Platelets 113 (*)    All other components within normal limits  ETHANOL  I-STAT TROPONIN, ED  I-STAT TROPONIN, ED    EKG EKG Interpretation  Date/Time:  Thursday February 22 2018 06:33:55 EST Ventricular Rate:  75 PR Interval:    QRS Duration: 83 QT Interval:  420 QTC Calculation: 470 R Axis:   46 Text Interpretation:  Sinus rhythm Abnormal  R-wave progression, early transition Artifact Otherwise no significant change Confirmed by Addison Lank [phone]) on 02/22/2018 6:38:32 AM   Radiology Dg Chest 2 View  Result Date: 02/22/2018 CLINICAL DATA:  Chest pain EXAM: CHEST - 2 VIEW COMPARISON:  01/30/2018 FINDINGS: Improved aeration bilaterally.  Probable clearing edema. Mild bibasilar atelectasis.  No significant effusion. IMPRESSION: Improved aeration bilaterally with decreased edema. Slight bibasilar atelectasis remains. Electronically Signed   By: Franchot Gallo M.D.   On: 02/22/2018 07:22    Procedures Procedures (including critical care time)  Medications Ordered in ED Medications  multivitamin with minerals tablet 1 tablet (1 tablet Oral Given 02/22/18 0915)  LORazepam (ATIVAN) injection 0-4 mg (2 mg Intravenous Given 02/22/18 0732)    Or  LORazepam (ATIVAN) tablet 0-4 mg ( Oral See Alternative 02/22/18 0732)  LORazepam (ATIVAN) injection 0-4 mg (has no administration in time range)    Or  LORazepam (ATIVAN) tablet 0-4 mg (has no administration in time range)  thiamine (VITAMIN B-1) tablet 100 mg ( Oral See Alternative 02/22/18 0915)    Or  thiamine (B-1) injection 100 mg (100 mg Intravenous Given 02/22/18 0915)  sodium chloride 0.9 % bolus 1,000 mL (1,000 mLs Intravenous New Bag/Given 02/22/18 0729)     Initial Impression / Assessment and Plan / ED Course  I have reviewed the triage vital signs and the nursing notes.  Pertinent labs & imaging results that were available during my care of the patient were reviewed by me and considered in my medical decision making (see chart for details).  Clinical Course as of Feb 22 1021  Thu Feb 22, 2018  0818 Patient given 2 mg of Ativan.  Reports improvement in chest pain but still continues to have tremors.   [HK]  7893 Patient resting comfortably, no tremors noted.  Last CIWA score of 6.   [HK]  1009 Patient reports improvement in her chest pain and tremors.   [HK]      Clinical Course User Index [HK] Delia Heady, PA-C    62 year old female with a past medical history of alcohol abuse, SVT, GERD presents to ED for complaints of alcohol withdrawal.  Patient somewhat of a poor historian but does note she began having tremors, chest pressure and nausea since last night.  Last alcoholic beverage was greater than 24 hours ago.  She usually drinks a 24 ounce bottle of wine daily.  Reports improvement in chest pain with aspirin nitroglycerin given by EMS.  On my exam there are coarse tremors noted of bilateral upper extremities which seem to improve when patient is answering some of my questions.  Vital signs within normal limits, she is not tachycardic or hypertensive.  Lab work including initial and delta troponin, CBC, BMP unremarkable.  EtOH level is less than 10.  Chest x-ray is unremarkable.  EKG shows no changes from prior tracings.  Patient given 1 dose of Ativan with improvement in her symptoms.  Her tremor has resolved and she is resting comfortably.  Do not feel that admission is necessary at this time as her last CIWA score is 6. She has improvement in her chest pain and both troponin have been negative. She is low risk by Well's score, and patient agrees that her chest pressure is 2/2 her withdrawal. Will advise her to return to ED for any severe worsening  symptoms.  Patient is hemodynamically stable, in NAD, and able to ambulate in the ED. Evaluation does not show pathology that would require ongoing emergent intervention or inpatient treatment. I explained the diagnosis to the patient. Pain has been managed and has no complaints prior to discharge. Patient is comfortable with above plan and is stable for discharge at this time. All questions were answered prior to disposition. Strict return precautions for returning to the ED were discussed. Encouraged follow up with PCP.    Portions of this note were generated with Lobbyist. Dictation errors  may occur despite best attempts at proofreading.   Final Clinical Impressions(s) / ED Diagnoses   Final diagnoses:  Chest wall pain  Alcohol abuse    ED Discharge Orders         Ordered    chlordiazePOXIDE (LIBRIUM) 25 MG capsule     02/22/18 1010           Delia Heady, PA-C 02/22/18 1022    Margette Fast, MD 02/22/18 2041

## 2018-02-22 NOTE — ED Triage Notes (Addendum)
Patient c/o withdrawals (DT's - last drink was >24 hours ago, sometime Christmas day. C/o CP, tremors, nausea. Patient was given 324mg  asa, 2 nitro with little relief. Generally drinks one bottle of wine (24 ounces) daily.

## 2018-02-22 NOTE — Discharge Instructions (Signed)
Return to ED for worsening symptoms, increased tremors, hallucinations, chest pain or shortness of breath.

## 2018-05-04 ENCOUNTER — Emergency Department (HOSPITAL_COMMUNITY): Payer: Federal, State, Local not specified - PPO

## 2018-05-04 ENCOUNTER — Other Ambulatory Visit: Payer: Self-pay

## 2018-05-04 ENCOUNTER — Encounter (HOSPITAL_COMMUNITY): Payer: Self-pay

## 2018-05-04 ENCOUNTER — Inpatient Hospital Stay (HOSPITAL_COMMUNITY)
Admission: EM | Admit: 2018-05-04 | Discharge: 2018-05-08 | DRG: 897 | Disposition: A | Discharge status: Home Health | Payer: Federal, State, Local not specified - PPO | Attending: Internal Medicine | Admitting: Internal Medicine

## 2018-05-04 DIAGNOSIS — R1310 Dysphagia, unspecified: Secondary | ICD-10-CM | POA: Diagnosis present

## 2018-05-04 DIAGNOSIS — G8929 Other chronic pain: Secondary | ICD-10-CM | POA: Diagnosis present

## 2018-05-04 DIAGNOSIS — R079 Chest pain, unspecified: Secondary | ICD-10-CM | POA: Diagnosis present

## 2018-05-04 DIAGNOSIS — Z7982 Long term (current) use of aspirin: Secondary | ICD-10-CM

## 2018-05-04 DIAGNOSIS — Z885 Allergy status to narcotic agent status: Secondary | ICD-10-CM

## 2018-05-04 DIAGNOSIS — R0602 Shortness of breath: Secondary | ICD-10-CM | POA: Diagnosis not present

## 2018-05-04 DIAGNOSIS — H9190 Unspecified hearing loss, unspecified ear: Secondary | ICD-10-CM | POA: Diagnosis present

## 2018-05-04 DIAGNOSIS — K760 Fatty (change of) liver, not elsewhere classified: Secondary | ICD-10-CM | POA: Diagnosis present

## 2018-05-04 DIAGNOSIS — F419 Anxiety disorder, unspecified: Secondary | ICD-10-CM | POA: Diagnosis present

## 2018-05-04 DIAGNOSIS — Z881 Allergy status to other antibiotic agents status: Secondary | ICD-10-CM

## 2018-05-04 DIAGNOSIS — E871 Hypo-osmolality and hyponatremia: Secondary | ICD-10-CM | POA: Diagnosis present

## 2018-05-04 DIAGNOSIS — F339 Major depressive disorder, recurrent, unspecified: Secondary | ICD-10-CM | POA: Diagnosis present

## 2018-05-04 DIAGNOSIS — E876 Hypokalemia: Secondary | ICD-10-CM | POA: Diagnosis present

## 2018-05-04 DIAGNOSIS — I341 Nonrheumatic mitral (valve) prolapse: Secondary | ICD-10-CM | POA: Diagnosis present

## 2018-05-04 DIAGNOSIS — Z780 Asymptomatic menopausal state: Secondary | ICD-10-CM

## 2018-05-04 DIAGNOSIS — E785 Hyperlipidemia, unspecified: Secondary | ICD-10-CM | POA: Diagnosis present

## 2018-05-04 DIAGNOSIS — R000 Tachycardia, unspecified: Secondary | ICD-10-CM | POA: Diagnosis not present

## 2018-05-04 DIAGNOSIS — I471 Supraventricular tachycardia: Secondary | ICD-10-CM | POA: Diagnosis present

## 2018-05-04 DIAGNOSIS — K219 Gastro-esophageal reflux disease without esophagitis: Secondary | ICD-10-CM | POA: Diagnosis present

## 2018-05-04 DIAGNOSIS — K2920 Alcoholic gastritis without bleeding: Secondary | ICD-10-CM | POA: Diagnosis present

## 2018-05-04 DIAGNOSIS — Z79891 Long term (current) use of opiate analgesic: Secondary | ICD-10-CM

## 2018-05-04 DIAGNOSIS — M810 Age-related osteoporosis without current pathological fracture: Secondary | ICD-10-CM | POA: Diagnosis present

## 2018-05-04 DIAGNOSIS — R531 Weakness: Secondary | ICD-10-CM | POA: Diagnosis not present

## 2018-05-04 DIAGNOSIS — Z9181 History of falling: Secondary | ICD-10-CM

## 2018-05-04 DIAGNOSIS — K700 Alcoholic fatty liver: Secondary | ICD-10-CM | POA: Diagnosis present

## 2018-05-04 DIAGNOSIS — Z9049 Acquired absence of other specified parts of digestive tract: Secondary | ICD-10-CM

## 2018-05-04 DIAGNOSIS — Z79890 Hormone replacement therapy (postmenopausal): Secondary | ICD-10-CM

## 2018-05-04 DIAGNOSIS — Z8262 Family history of osteoporosis: Secondary | ICD-10-CM

## 2018-05-04 DIAGNOSIS — F1020 Alcohol dependence, uncomplicated: Secondary | ICD-10-CM | POA: Diagnosis present

## 2018-05-04 DIAGNOSIS — S2241XD Multiple fractures of ribs, right side, subsequent encounter for fracture with routine healing: Secondary | ICD-10-CM | POA: Diagnosis not present

## 2018-05-04 DIAGNOSIS — M5416 Radiculopathy, lumbar region: Secondary | ICD-10-CM | POA: Diagnosis present

## 2018-05-04 DIAGNOSIS — F10239 Alcohol dependence with withdrawal, unspecified: Principal | ICD-10-CM | POA: Diagnosis present

## 2018-05-04 DIAGNOSIS — Z87891 Personal history of nicotine dependence: Secondary | ICD-10-CM

## 2018-05-04 DIAGNOSIS — Z79899 Other long term (current) drug therapy: Secondary | ICD-10-CM

## 2018-05-04 DIAGNOSIS — R918 Other nonspecific abnormal finding of lung field: Secondary | ICD-10-CM | POA: Diagnosis not present

## 2018-05-04 DIAGNOSIS — I10 Essential (primary) hypertension: Secondary | ICD-10-CM | POA: Diagnosis present

## 2018-05-04 DIAGNOSIS — F10939 Alcohol use, unspecified with withdrawal, unspecified: Secondary | ICD-10-CM

## 2018-05-04 DIAGNOSIS — E049 Nontoxic goiter, unspecified: Secondary | ICD-10-CM | POA: Diagnosis present

## 2018-05-04 DIAGNOSIS — F19939 Other psychoactive substance use, unspecified with withdrawal, unspecified: Secondary | ICD-10-CM | POA: Diagnosis not present

## 2018-05-04 LAB — CBC
HCT: 38.9 % (ref 36.0–46.0)
Hemoglobin: 13.3 g/dL (ref 12.0–15.0)
MCH: 32.3 pg (ref 26.0–34.0)
MCHC: 34.2 g/dL (ref 30.0–36.0)
MCV: 94.4 fL (ref 80.0–100.0)
Platelets: 241 10*3/uL (ref 150–400)
RBC: 4.12 MIL/uL (ref 3.87–5.11)
RDW: 11.4 % — ABNORMAL LOW (ref 11.5–15.5)
WBC: 7.3 10*3/uL (ref 4.0–10.5)
nRBC: 0 % (ref 0.0–0.2)

## 2018-05-04 LAB — BASIC METABOLIC PANEL
Anion gap: 21 — ABNORMAL HIGH (ref 5–15)
BUN: 21 mg/dL (ref 8–23)
CALCIUM: 10.9 mg/dL — AB (ref 8.9–10.3)
CO2: 27 mmol/L (ref 22–32)
Chloride: 85 mmol/L — ABNORMAL LOW (ref 98–111)
Creatinine, Ser: 1.34 mg/dL — ABNORMAL HIGH (ref 0.44–1.00)
GFR calc Af Amer: 49 mL/min — ABNORMAL LOW (ref >60)
GFR, EST NON AFRICAN AMERICAN: 42 mL/min — AB (ref >60)
Glucose, Bld: 108 mg/dL — ABNORMAL HIGH (ref 70–99)
Potassium: 4.2 mmol/L (ref 3.5–5.1)
Sodium: 133 mmol/L — ABNORMAL LOW (ref 135–145)

## 2018-05-04 LAB — I-STAT TROPONIN, ED: TROPONIN I, POC: 0.01 ng/mL (ref 0.00–0.08)

## 2018-05-04 MED ORDER — LORAZEPAM 1 MG PO TABS
0.0000 mg | ORAL_TABLET | Freq: Four times a day (QID) | ORAL | Status: AC
  Administered 2018-05-04 – 2018-05-05 (×2): 1 mg via ORAL
  Filled 2018-05-04 (×2): qty 1

## 2018-05-04 MED ORDER — LORAZEPAM 2 MG/ML IJ SOLN: 0.0000 mg | Freq: Four times a day (QID) | INTRAMUSCULAR | Status: AC

## 2018-05-04 MED ORDER — SODIUM CHLORIDE 0.9 % IV BOLUS
1000.0000 mL | Freq: Once | INTRAVENOUS | Status: DC
  Administered 2018-05-05: 1000 mL via INTRAVENOUS

## 2018-05-04 MED ORDER — THIAMINE HCL 100 MG/ML IJ SOLN
Freq: Once | INTRAVENOUS | Status: AC
  Administered 2018-05-05: via INTRAVENOUS
  Filled 2018-05-04: qty 1000

## 2018-05-04 MED ORDER — LORAZEPAM 1 MG PO TABS: 0.0000 mg | ORAL_TABLET | Freq: Two times a day (BID) | ORAL | Status: DC

## 2018-05-04 MED ORDER — SODIUM CHLORIDE 0.9% FLUSH
3.0000 mL | Freq: Once | INTRAVENOUS | Status: AC
  Administered 2018-05-06: 3 mL via INTRAVENOUS

## 2018-05-04 MED ORDER — LORAZEPAM 2 MG/ML IJ SOLN: 0.0000 mg | Freq: Two times a day (BID) | INTRAMUSCULAR | Status: DC

## 2018-05-04 NOTE — ED Notes (Signed)
Pt is nervous and jittery

## 2018-05-04 NOTE — Progress Notes (Deleted)
Subjective:    Patient ID: Terri Ayala, female    DOB: 03-02-1955, 63 y.o.   MRN: [id number]  HPI:  Terri Ayala is here to establish as a new pt. She is a pleasant 63 year old female. PMH:HTN, MDD, Migraine, Alcohol Use Disorder, Anxiety, MVP, and chronic pain (Lumbosacral radiculopathy)  Patient Care Team    Relationship Specialty Notifications Start End  Patient, No Pcp Per PCP - [facility]  01/04/18    Comment: "Terri Ayala's office dropped me"    Patient Active Problem List   Diagnosis Date Noted  . Rib fracture 01/04/2018  . Chest pain 12/08/2017  . Leukocytosis 12/08/2017  . Essential hypertension 12/08/2017  . Alcohol withdrawal ([place]) 07/21/2017  . Major depressive disorder, recurrent episode, moderate ([place])   . Alcohol use disorder, severe, dependence ([place]) 04/03/2014  . Lumbosacral radiculopathy at L5 05/20/2013  . Nonspecific elevation of levels of transaminase or lactic acid dehydrogenase (LDH) 03/16/2013  . Hepatic steatosis 03/16/2013  . Chronic cholecystitis 03/14/2013  . Cervical arthritis 10/12/2012  . Palpitations 11/29/2010  . SVT (supraventricular tachycardia) ([place]) 11/29/2010  . Mitral valve prolapse 11/29/2010  . Decreased libido 11/15/2010  . GOITER, MULTINODULAR 08/17/2009  . ANXIETY 08/17/2009  . Migraine headache 08/17/2009  . Hearing loss 08/17/2009  . Osteoporosis 08/17/2009  . ASYMPTOMATIC POSTMENOPAUSAL STATUS 08/17/2009  . DYSPHAGIA 06/19/2007     Past Medical History:  Diagnosis Date  . Alcohol withdrawal ([place]) 11/2017; 01/04/2018  . Alcoholism ([place])   . Anxiety   . Arthritis    "hands" (01/04/2018)  . ASYMPTOMATIC POSTMENOPAUSAL STATUS 08/17/2009  . Chronic lower back pain   . Depression   . DYSPHAGIA 06/19/2007  . Fatty liver, alcoholic   . GERD (gastroesophageal reflux disease)   . GOITER, MULTINODULAR 08/17/2009  . Hyperlipidemia   . Migraines    "not sure what triggers them; I'll have 1 q couple weeks or more; come 2  days in a row when they come" (01/04/2018)  . Mitral valve prolapse   . OSTEOPOROSIS 08/17/2009  . PONV (postoperative nausea and vomiting)   . Supraventricular tachycardia [facility])      Past Surgical History:  Procedure Laterality Date  . BREAST BIOPSY Right    "benign"  . CATARACT EXTRACTION W/ INTRAOCULAR LENS  IMPLANT, BILATERAL    . CERVICAL CONE BIOPSY  2000s  . CERVIX LESION DESTRUCTION  1991   "dysplasia; lesions"  . CHOLECYSTECTOMY N/A 03/14/2013   Procedure: LAPAROSCOPIC CHOLECYSTECTOMY WITH ATTEMPTED INTRAOPERATIVE CHOLANGIOGRAM;  Surgeon: Harl Bowie, MD;  Location: [facility];  Service: General;  Laterality: N/A;  . laporoscopic abdominal surgery     "for endometriosis"     Family History  Problem Relation Age of Onset  . Colon cancer Father        Deceased, 57  . Osteoporosis Mother        Living, 98  . Healthy Brother   . Thyroid disease Neg Hx   . Goiter Neg Hx      Social History   Substance and Sexual Activity  Drug Use Never     Social History   Substance and Sexual Activity  Alcohol Use Yes  . Alcohol/week: 63.0 standard drinks  . Types: 63 Glasses of wine per week   Comment: 01/04/2018 "1 1/2 bottles of wine/day"     Social History   Tobacco Use  Smoking Status Former Smoker  Smokeless Tobacco Never Used  Tobacco Comment   01/04/2018 "smoked when I was  a teenager"     Outpatient Encounter Medications as of 05/07/2018  Medication Sig Note  . alendronate (FOSAMAX) 70 MG tablet Take 70 mg by mouth once a week. Take with a full glass of water on an empty stomach. 01/04/2018: LF 12-31-17 84DS  . aspirin EC 81 MG tablet Take 81 mg by mouth daily.   . busPIRone (BUSPAR) 15 MG tablet Take 15 mg by mouth 3 (three) times daily. 01/04/2018: LF 12-06-17 30DS  . chlordiazePOXIDE (LIBRIUM) 25 MG capsule 50mg  PO TID x 1D, then 25-50mg  PO BID X 1D, then 25-50mg  PO QD X 1D   . diclofenac sodium (VOLTAREN) 1 % GEL Apply 2 g topically 4 (four) times daily.  01/04/2018: LF 12-20-17 14DS  . DULoxetine (CYMBALTA) 60 MG capsule TAKE 1 CAPSULE (60 MG TOTAL) BY MOUTH DAILY. (Patient taking differently: Take 60 mg by mouth daily. ) 01/04/2018: LF 11-28-17 30DS  . folic acid (FOLVITE) 1 MG tablet Take 1 tablet (1 mg total) by mouth daily.   [name] Kitchen gabapentin (NEURONTIN) 300 MG capsule Take 1 capsule (300 mg total) by mouth 2 (two) times daily. 01/04/2018: LF 12-11-17 30DS  . hydrOXYzine (ATARAX/VISTARIL) 25 MG tablet Take 25 mg by mouth 3 (three) times daily as needed for anxiety.   . lidocaine (LIDODERM) 5 % Place 1 patch onto the skin daily. Remove & Discard patch within 12 hours or as directed by MD 01/04/2018: LF 12-20-17 30DS  . Multiple Vitamin (MULTIVITAMIN WITH MINERALS) TABS tablet Take 1 tablet by mouth daily.   . norethindrone-ethinyl estradiol (FEMHRT 1/5) 1-5 MG-MCG TABS Take 1 tablet by mouth daily. For Osteoporosis 01/04/2018: LF 10-12-17 84DS  . ondansetron (ZOFRAN ODT) 4 MG disintegrating tablet Take 1 tablet (4 mg total) by mouth every 8 (eight) hours as needed for nausea or vomiting.   . pantoprazole (PROTONIX) 40 MG tablet Take 1 tablet (40 mg total) by mouth daily at 12 noon. (Patient taking differently: Take 40 mg by mouth daily as needed (heartburn). )   . propranolol (INDERAL) 10 MG tablet Take 20 mg by mouth 2 (two) times daily.    . SUMAtriptan (IMITREX) 100 MG tablet Take 1 tablet at the first sign of migraine.  May repeat in 2 hours. (Patient taking differently: Take 100 mg by mouth every 2 (two) hours as needed for migraine. ) 01/04/2018: LF 10-11-17 QTY10  . thiamine 100 MG tablet Take 1 tablet (100 mg total) by mouth daily.   . traZODone (DESYREL) 50 MG tablet Take 50 mg by mouth at bedtime as needed for sleep.  01/04/2018: LF 11-03-17 90DS   No facility-administered encounter medications on file as of 05/07/2018.     Allergies: Doxycycline; Codeine; and Tetracycline hcl  There is no height or weight on file to calculate BMI.  There were no  vitals taken for this visit.     Review of Systems     Objective:   Physical Exam        Assessment & Plan:  No diagnosis found.  No problem-specific Assessment & Plan notes found for this encounter.    FOLLOW-UP:  No follow-ups on file.

## 2018-05-04 NOTE — ED Provider Notes (Signed)
[facility] EMERGENCY DEPARTMENT Provider Note   CSN: [id number] Arrival date & time: 05/04/18  1719    History   Chief Complaint Chief Complaint  Patient presents with  . Chest Pain  . Alcohol Problem    HPI Terri Ayala is a 63 y.o. female with severe, chronic alcohol use disorder, HLD, HTN, hepatic steatosis, lumbar radiculopathy at L5, recurrent major depressive disorder, anxiety, and  hearing loss who presents to the emergency department with a chief complaint of "I think I'm going into alcohol withdrawal."   Patient reports that she typically drinks 1-1-1/2 bottles of wine daily.  She reports that she has been "cutting way back".  She last had a glass of wine at 10 AM.  She reports that she may have only had 1 to 2 glasses daily over the last week as she has been having nausea and vomiting daily.  No hematemesis.  She reports that she has not eaten "in days" due to the nausea and vomiting and is feeling generally weak and fatigued. "I think I'm dehydrated."  She has a history of frequent falls.  Reports her last fall was 5 days ago when she fell off the couch into the floor and landed on her bottom.  He denies hitting her head, syncope, or hitting her ribs.  She also endorses pressure-like chest pain and shortness of breath.  She ambulates with a walker and states she can walk approximately 2 steps before getting short of breath and having to sit down.  No known aggravating relieving factors.  She reports a history of similar chest pressure and shortness of breath with alcohol withdrawal.  She also endorses nasal congestion, nonproductive cough, rhinorrhea, and headache for the last few days.  No sore throat, fever, chills, myalgias, or rash.  She reports that she is been to inpatient and outpatient rehab for alcohol use numerous times.  She previously attended AA.  She states that she wants help with stopping drinking.  She does not have a history of DTs or seizures  related to alcohol withdrawal.     The history is provided by the patient. No language interpreter was used.    Past Medical History:  Diagnosis Date  . Alcohol withdrawal ([place]) 11/2017; 01/04/2018  . Alcoholism ([place])   . Anxiety   . Arthritis    "hands" (01/04/2018)  . ASYMPTOMATIC POSTMENOPAUSAL STATUS 08/17/2009  . Chronic lower back pain   . Depression   . DYSPHAGIA 06/19/2007  . Fatty liver, alcoholic   . GERD (gastroesophageal reflux disease)   . GOITER, MULTINODULAR 08/17/2009  . Hyperlipidemia   . Migraines    "not sure what triggers them; I'll have 1 q couple weeks or more; come 2 days in a row when they come" (01/04/2018)  . Mitral valve prolapse   . OSTEOPOROSIS 08/17/2009  . PONV (postoperative nausea and vomiting)   . Supraventricular tachycardia [facility])     Patient Active Problem List   Diagnosis Date Noted  . Rib fracture 01/04/2018  . Chest pain 12/08/2017  . Leukocytosis 12/08/2017  . Essential hypertension 12/08/2017  . Alcohol withdrawal ([place]) 07/21/2017  . Major depressive disorder, recurrent episode, moderate ([place])   . Alcohol use disorder, severe, dependence ([place]) 04/03/2014  . Lumbosacral radiculopathy at L5 05/20/2013  . Nonspecific elevation of levels of transaminase or lactic acid dehydrogenase (LDH) 03/16/2013  . Hepatic steatosis 03/16/2013  . Chronic cholecystitis 03/14/2013  . Cervical arthritis 10/12/2012  . Palpitations 11/29/2010  .  SVT (supraventricular tachycardia) ([place]) 11/29/2010  . Mitral valve prolapse 11/29/2010  . Decreased libido 11/15/2010  . GOITER, MULTINODULAR 08/17/2009  . ANXIETY 08/17/2009  . Migraine headache 08/17/2009  . Hearing loss 08/17/2009  . Osteoporosis 08/17/2009  . ASYMPTOMATIC POSTMENOPAUSAL STATUS 08/17/2009  . DYSPHAGIA 06/19/2007    Past Surgical History:  Procedure Laterality Date  . BREAST BIOPSY Right    "benign"  . CATARACT EXTRACTION W/ INTRAOCULAR LENS  IMPLANT, BILATERAL    . CERVICAL CONE  BIOPSY  2000s  . CERVIX LESION DESTRUCTION  1991   "dysplasia; lesions"  . CHOLECYSTECTOMY N/A 03/14/2013   Procedure: LAPAROSCOPIC CHOLECYSTECTOMY WITH ATTEMPTED INTRAOPERATIVE CHOLANGIOGRAM;  Surgeon: Harl Bowie, MD;  Location: [facility];  Service: General;  Laterality: N/A;  . laporoscopic abdominal surgery     "for endometriosis"     OB History   No obstetric history on file.      Home Medications    Prior to Admission medications   Medication Sig Start Date End Date Taking? Authorizing Provider  alendronate (FOSAMAX) 70 MG tablet Take 70 mg by mouth every Monday. Take with a full glass of water on an empty stomach.    Yes [provider]  aspirin EC 81 MG tablet Take 81 mg by mouth daily.   Yes [provider]  busPIRone (BUSPAR) 15 MG tablet Take 15 mg by mouth 3 (three) times daily.   Yes [provider]  diclofenac sodium (VOLTAREN) 1 % GEL Apply 2 g topically 4 (four) times daily. Patient taking differently: Apply 2 g topically 4 (four) times daily as needed (pain).  12/19/17  Yes Yu, Amy V, PA-C  DULoxetine (CYMBALTA) 60 MG capsule TAKE 1 CAPSULE (60 MG TOTAL) BY MOUTH DAILY. Patient taking differently: Take 60 mg by mouth 2 (two) times daily.  10/23/14  Yes Denita Lung, MD  hydrOXYzine (ATARAX/VISTARIL) 25 MG tablet Take 25 mg by mouth 2 (two) times daily.    Yes [provider]  lidocaine (LIDODERM) 5 % Place 1 patch onto the skin daily. Remove & Discard patch within 12 hours or as directed by MD Patient taking differently: Place 1 patch onto the skin daily as needed (lower back pain). Remove & Discard patch within 12 hours or as directed by MD 12/19/17  Yes Yu, Amy V, PA-C  Multiple Vitamin (MULTIVITAMIN WITH MINERALS) TABS tablet Take 1 tablet by mouth daily. 01/10/18  Yes Mikhail, [place], DO  norethindrone-ethinyl estradiol (FEMHRT 1/5) 1-5 MG-MCG TABS Take 1 tablet by mouth daily. For Osteoporosis 04/07/14  Yes Lindell Spar I, NP    ondansetron (ZOFRAN ODT) 4 MG disintegrating tablet Take 1 tablet (4 mg total) by mouth every 8 (eight) hours as needed for nausea or vomiting. 09/01/17  Yes Henson, Vickie L, NP-C  pantoprazole (PROTONIX) 40 MG tablet Take 1 tablet (40 mg total) by mouth daily at 12 noon. Patient taking differently: Take 40 mg by mouth daily as needed (heartburn).  11/09/17  Yes Ghimire, Henreitta Leber, MD  propranolol (INDERAL) 10 MG tablet Take 10 mg by mouth 2 (two) times daily.    Yes [provider]  SUMAtriptan (IMITREX) 100 MG tablet Take 1 tablet at the first sign of migraine.  May repeat in 2 hours. Patient taking differently: Take 100 mg by mouth every 2 (two) hours as needed for migraine.  07/31/17  Yes Denita Lung, MD  thiamine 100 MG tablet Take 1 tablet (100 mg total) by mouth daily. 11/09/17  Yes  Ghimire, Henreitta Leber, MD  traZODone (DESYREL) 50 MG tablet Take 50 mg by mouth at bedtime as needed for sleep.    Yes [provider]    Family History Family History  Problem Relation Age of Onset  . Colon cancer Father        Deceased, 53  . Osteoporosis Mother        Living, 47  . Healthy Brother   . Thyroid disease Neg Hx   . Goiter Neg Hx     Social History Social History   Tobacco Use  . Smoking status: Former Research scientist (life sciences)  . Smokeless tobacco: Never Used  . Tobacco comment: 01/04/2018 "smoked when I was a teenager"  Substance Use Topics  . Alcohol use: Yes    Alcohol/week: 63.0 standard drinks    Types: 63 Glasses of wine per week    Comment: 01/04/2018 "1 1/2 bottles of wine/day"  . Drug use: Never     Allergies   Doxycycline; Codeine; and Tetracycline hcl   Review of Systems Review of Systems  Constitutional: Positive for appetite change and fatigue. Negative for activity change, chills and fever.  HENT: Positive for congestion and rhinorrhea. Negative for facial swelling, sinus pressure and sinus pain.   Respiratory: Positive for cough and shortness of breath.  Negative for wheezing.   Cardiovascular: Positive for chest pain. Negative for palpitations and leg swelling.  Gastrointestinal: Positive for diarrhea and vomiting. Negative for abdominal pain.  Genitourinary: Negative for dysuria.  Musculoskeletal: Negative for back pain, myalgias, neck pain and neck stiffness.  Skin: Negative for rash.  Allergic/Immunologic: Negative for immunocompromised state.  Neurological: Positive for tremors, weakness and headaches. Negative for light-headedness and numbness.  Psychiatric/Behavioral: Negative for confusion, hallucinations and suicidal ideas.   Physical Exam Updated Vital Signs BP 110/71 (BP Location: Right Arm)   Pulse (!) 101   Temp 98.1 F (36.7 C) (Oral)   Resp 18   Ht 5\' 1"  (1.549 m)   Wt 64.1 kg   SpO2 100%   BMI 26.70 kg/m   Physical Exam Vitals signs and nursing note reviewed.  Constitutional:      General: She is not in acute distress. HENT:     Head: Normocephalic.     Comments: Hard of hearing    Nose: Congestion and rhinorrhea present.     Right Sinus: No maxillary sinus tenderness or frontal sinus tenderness.     Left Sinus: No maxillary sinus tenderness or frontal sinus tenderness.     Mouth/Throat:     Lips: Pink.     Pharynx: Oropharynx is clear. Uvula midline.  Eyes:     Conjunctiva/sclera: Conjunctivae normal.  Neck:     Musculoskeletal: Neck supple.  Cardiovascular:     Rate and Rhythm: Regular rhythm. Tachycardia present.     Heart sounds: No murmur. No friction rub. No gallop.   Pulmonary:     Effort: Pulmonary effort is normal. No respiratory distress.     Breath sounds: No stridor. No wheezing, rhonchi or rales.  Chest:     Chest wall: No tenderness.  Abdominal:     General: There is no distension.     Palpations: Abdomen is soft. There is no mass.     Tenderness: There is no abdominal tenderness. There is no right CVA tenderness, left CVA tenderness, guarding or rebound.     Hernia: No hernia is  present.  Musculoskeletal:     Right lower leg: She exhibits no tenderness. No edema.  Left lower leg: She exhibits no tenderness. No edema.  Skin:    General: Skin is warm.     Findings: No rash.  Neurological:     Mental Status: She is alert.     Comments: No slurred speech.  Oriented to person, time, and place. Tremulous.   Psychiatric:        Behavior: Behavior normal.      ED Treatments / Results  Labs (all labs ordered are listed, but only abnormal results are displayed) Labs Reviewed  BASIC METABOLIC PANEL - Abnormal; Notable for the following components:      Result Value   Sodium 133 (*)    Chloride 85 (*)    Glucose, Bld 108 (*)    Creatinine, Ser 1.34 (*)    Calcium 10.9 (*)    GFR calc non Af Amer 42 (*)    GFR calc Af Amer 49 (*)    Anion gap 21 (*)    All other components within normal limits  CBC - Abnormal; Notable for the following components:   RDW 11.4 (*)    All other components within normal limits  D-DIMER, QUANTITATIVE (NOT AT [facility]) - Abnormal; Notable for the following components:   D-Dimer, Quant 1.25 (*)    All other components within normal limits  MAGNESIUM - Abnormal; Notable for the following components:   Magnesium 1.5 (*)    All other components within normal limits  COMPREHENSIVE METABOLIC PANEL - Abnormal; Notable for the following components:   Sodium 134 (*)    Potassium 2.9 (*)    Chloride 91 (*)    Creatinine, Ser 1.13 (*)    Total Protein 5.8 (*)    Albumin 3.1 (*)    AST 89 (*)    Total Bilirubin 1.4 (*)    GFR calc non Af Amer 52 (*)    GFR calc Af Amer 60 (*)    Anion gap 18 (*)    All other components within normal limits  LIPASE, BLOOD - Abnormal; Notable for the following components:   Lipase 79 (*)    All other components within normal limits  ETHANOL  PHOSPHORUS  I-STAT TROPONIN, ED  I-STAT TROPONIN, ED    EKG EKG Interpretation  Date/Time:  Friday May 04 2018 17:24:00 EST Ventricular Rate:  130 PR  Interval:  120 QRS Duration: 66 QT Interval:  310 QTC Calculation: 456 R Axis:   53 Text Interpretation:  Sinus tachycardia Otherwise normal ECG Rate faster Confirmed by Ezequiel Essex [phone]) on 05/05/2018 12:51:59 AM   Radiology Dg Chest 2 View  Result Date: 05/04/2018 CLINICAL DATA:  Chest pain EXAM: CHEST - 2 VIEW COMPARISON:  02/22/2018 FINDINGS: No acute consolidation or effusion. Normal heart size. No pneumothorax. There are small nodular opacities in the right mid lung. There is a healing right eighth rib fracture. IMPRESSION: 1. No acute pulmonary infiltrate. 2. Small nodular opacities in the right mid lung. Consider chest CT for further evaluation. Electronically Signed   By: Donavan Foil M.D.   On: 05/04/2018 20:49   Ct Chest Wo Contrast  Result Date: 05/05/2018 CLINICAL DATA:  63 year old female with dyspnea. EXAM: CT CHEST WITHOUT CONTRAST TECHNIQUE: Multidetector CT imaging of the chest was performed following the standard protocol without IV contrast. COMPARISON:  Same day CXR FINDINGS: Cardiovascular: Normal heart size without pericardial effusion. Coronary arteriosclerosis along the main and LAD. Nonaneurysmal minimally atherosclerotic thoracic aorta. The unenhanced pulmonary arteries unremarkable. Mediastinum/Nodes: No enlarged mediastinal or axillary lymph  nodes. Thyroid gland, trachea, and esophagus demonstrate no significant findings. Lungs/Pleura: Streaky scarring and/or atelectasis in the right upper lobe. No dominant mass, effusion or consolidations. Apical pleuroparenchymal scarring is noted bilaterally. Upper Abdomen: Steatosis of liver. Cholecystectomy clips are present. No adrenal mass. Musculoskeletal: Healing right eighth and ninth rib fractures with callus as well as costochondral calcifications on the right are believed to account for the nodular density seen on the earlier same day chest radiograph. IMPRESSION: No pulmonary nodule identified. The nodular densities seen  on earlier same day chest radiograph are felt secondary to costochondral calcifications as well as callus from prior eighth and ninth posterior rib fractures. Mild coronary arteriosclerosis. Hepatic steatosis. Aortic Atherosclerosis (ICD10-I70.0). Electronically Signed   By: Ashley Royalty M.D.   On: 05/05/2018 00:47    Procedures Procedures (including critical care time)  Medications Ordered in ED Medications  sodium chloride flush (NS) 0.9 % injection 3 mL (has no administration in time range)  LORazepam (ATIVAN) injection 0-4 mg ( Intravenous See Alternative 05/05/18 0548)    Or  LORazepam (ATIVAN) tablet 0-4 mg (0 mg Oral Not Given 05/05/18 0548)  LORazepam (ATIVAN) injection 0-4 mg (has no administration in time range)    Or  LORazepam (ATIVAN) tablet 0-4 mg (has no administration in time range)  LORazepam (ATIVAN) tablet 1 mg (has no administration in time range)    Or  LORazepam (ATIVAN) injection 1 mg (has no administration in time range)  thiamine (VITAMIN B-1) tablet 100 mg (has no administration in time range)    Or  thiamine (B-1) injection 100 mg (has no administration in time range)  folic acid (FOLVITE) tablet 1 mg (has no administration in time range)  multivitamin with minerals tablet 1 tablet (has no administration in time range)  aspirin EC tablet 81 mg (has no administration in time range)  busPIRone (BUSPAR) tablet 15 mg (has no administration in time range)  DULoxetine (CYMBALTA) DR capsule 60 mg (60 mg Oral Not Given 05/05/18 0618)  hydrOXYzine (ATARAX/VISTARIL) tablet 25 mg (25 mg Oral Not Given 05/05/18 0618)  norethindrone-ethinyl estradiol (FEMHRT 1/5) 1-5 MG-MCG per tablet 1 tablet (has no administration in time range)  propranolol (INDERAL) tablet 10 mg (10 mg Oral Not Given 05/05/18 0618)  pantoprazole (PROTONIX) EC tablet 40 mg (has no administration in time range)  traZODone (DESYREL) tablet 50 mg (has no administration in time range)  acetaminophen (TYLENOL) tablet  650 mg (has no administration in time range)    Or  acetaminophen (TYLENOL) suppository 650 mg (has no administration in time range)  ondansetron (ZOFRAN) tablet 4 mg (has no administration in time range)    Or  ondansetron (ZOFRAN) injection 4 mg (has no administration in time range)  enoxaparin (LOVENOX) injection 40 mg (has no administration in time range)  magnesium sulfate IVPB 2 g 50 mL (has no administration in time range)  potassium chloride 10 mEq in 100 mL IVPB (has no administration in time range)  potassium chloride SA (K-DUR,KLOR-CON) CR tablet 40 mEq (has no administration in time range)  sodium chloride 0.9 % 1,000 mL with thiamine 702 mg, folic acid 1 mg, multivitamins adult 10 mL infusion ( Intravenous Transfusing/Transfer 05/05/18 0420)     Initial Impression / Assessment and Plan / ED Course  I have reviewed the triage vital signs and the nursing notes.  Pertinent labs & imaging results that were available during my care of the patient were reviewed by me and considered in my medical decision making (  see chart for details).        63 year old female with a history of severe alcohol use disorder, HLD, HTN, hepatic steatosis, lumbar radiculopathy at L5, recurrent major depressive disorder, anxiety, and  hearing loss presenting with alcohol withdrawal.  She is tremulous and tachycardic.  EKG with sinus tachycardia.  Documented CIWA score is 7 based on nursing staff evaluation, but I suspect this is higher given reported symptoms during history taking.  She is alert and oriented x3.  CIWA protocol orders have been placed.  Banana bag given.  Labs are notable for mild hyponatremia of 133 and creatinine of 1.34.  Chest x-ray with a small nodular opacities in the right midlung.  Will order CT chest for further evaluation. Calcium is 10.9, which may be secondary to dehydration.  CT chest with nodular densities that are felt to be secondary to costochondral calcifications as well as  callus from prior eighth and ninth posterior rib fractures.  She has mild coronary arterial atherosclerosis.  CT is otherwise unremarkable.  Despite fluid resuscitation, the patient remained tachycardic.  D-dimer ordered, which is elevated.  CT PE study is pending.  Given the patient's history of chronic, severe alcohol use disorder and frequent attempts at detoxification and history of prior complicated withdrawal, the hospitalist team was consulted for admission.  Dr. Alcario Drought has accepted the patient for admission. The patient appears reasonably stabilized for admission considering the current resources, flow, and capabilities available in the ED at this time, and I doubt any other [facility] requiring further screening and/or treatment in the ED prior to admission.  Final Clinical Impressions(s) / ED Diagnoses   Final diagnoses:  Alcohol withdrawal syndrome with complication [facility])    ED Discharge Orders    None       Joanne Gavel, PA-C 05/05/18 0752    Ezequiel Essex, MD 05/06/18 [phone]

## 2018-05-04 NOTE — ED Triage Notes (Signed)
[facility]- pt has been having CP worse with exertion X2 weeks. Has been trying to self taper off ETOh. 324 of aspirin. Last drink yesterday.   CBG 117 HR 108 117/79

## 2018-05-04 NOTE — Progress Notes (Signed)
Pt provides collateral contact for her mom "Mera Gunkel" [phone].  Lind Covert, MSW, LCSW Therapeutic Triage Specialist  [phone]

## 2018-05-04 NOTE — BH Assessment (Addendum)
Tele Assessment Note   Patient Name: Terri Ayala MRN: [id number] Referring Physician: Joanne Gavel PA-C Location of Patient: [facility] Location of Provider: [place] is an 63 y.o. female who presents to the ED voluntarily. Pt reports increased alcohol consumption and withdrawal symptoms. Pt states she she has been experiencing nausea and vomiting for the past several days prompting her to come to the ED. Pt denies SI, HI and denies AVH. Pt has a hx of depression. Pt states she is not followed by any current MH OPT provider but states she has received treatment multiple times due to alcohol dependence. Pt states she has been to [employer], had a sponsor, inpt at [facility] and has also been inpt ay [facility] and [facility]. Pt states she feels that she needs help with her drinking and feels discouraged about her increased alcohol abuse. Pt states she has not eaten in several days due to her nausea. Pt reports supportive factors including her family and her willingness to seek treatment.   Pt provides consent for TTS to speak with her mother in order to obtain collateral information. Pt's mom reports she is concerned for the pt due to excessive drinking and living alone. Pt has had numerous falls due to alcohol intoxication. Mom reports she lives in [place] and she wants the pt to relocate in order to be closer to her but the pt refuses. Mom states the pt also has a brother that tries to assist the pt but the family is concerned that she will not stop drinking. Mom states she is unsure if the pt is taking care of her personal hygiene and her living environment.   Lindon Romp, NP recommends continued observation for safety and stabilization and to be reassessed in the AM by psych with a peer support consult requested to assist with alcohol dependence issues. Chrisco, [name], RN has been advised. EDP McDonald, Mia A, PA-C has been advised.  Diagnosis: Alcohol use disorder,  severe; Substance induced mood disorder  Past Medical History:  Past Medical History:  Diagnosis Date  . Alcohol withdrawal ([place]) 11/2017; 01/04/2018  . Alcoholism ([place])   . Anxiety   . Arthritis    "hands" (01/04/2018)  . ASYMPTOMATIC POSTMENOPAUSAL STATUS 08/17/2009  . Chronic lower back pain   . Depression   . DYSPHAGIA 06/19/2007  . Fatty liver, alcoholic   . GERD (gastroesophageal reflux disease)   . GOITER, MULTINODULAR 08/17/2009  . Hyperlipidemia   . Migraines    "not sure what triggers them; I'll have 1 q couple weeks or more; come 2 days in a row when they come" (01/04/2018)  . Mitral valve prolapse   . OSTEOPOROSIS 08/17/2009  . PONV (postoperative nausea and vomiting)   . Supraventricular tachycardia [facility])     Past Surgical History:  Procedure Laterality Date  . BREAST BIOPSY Right    "benign"  . CATARACT EXTRACTION W/ INTRAOCULAR LENS  IMPLANT, BILATERAL    . CERVICAL CONE BIOPSY  2000s  . CERVIX LESION DESTRUCTION  1991   "dysplasia; lesions"  . CHOLECYSTECTOMY N/A 03/14/2013   Procedure: LAPAROSCOPIC CHOLECYSTECTOMY WITH ATTEMPTED INTRAOPERATIVE CHOLANGIOGRAM;  Surgeon: Harl Bowie, MD;  Location: [place];  Service: General;  Laterality: N/A;  . laporoscopic abdominal surgery     "for endometriosis"    Family History:  Family History  Problem Relation Age of Onset  . Colon cancer Father        Deceased, 15  . Osteoporosis Mother  Living, 78  . Healthy Brother   . Thyroid disease Neg Hx   . Goiter Neg Hx     Social History:  reports that she has quit smoking. She has never used smokeless tobacco. She reports current alcohol use of about 63.0 standard drinks of alcohol per week. She reports that she does not use drugs.  Additional Social History:  Alcohol / Drug Use Pain Medications: See MAR Prescriptions: See MAR Over the Counter: See MAR History of alcohol / drug use?: Yes Longest period of sobriety (when/how long): 5 years Negative  Consequences of Use: Personal relationships, Work / Youth worker Withdrawal Symptoms: Tingling, Nausea / Vomiting, Patient aware of relationship between substance abuse and physical/medical complications Substance #1 Name of Substance 1: Alcohol 1 - Age of First Use: 19a 1 - Amount (size/oz): 1-2 bottles of wine 1 - Frequency: daily 1 - Duration: ongoing 1 - Last Use / Amount: 05/04/18  CIWA: CIWA-Ar BP: 136/79 Pulse Rate: (!) 118 Nausea and Vomiting: no nausea and no vomiting Tactile Disturbances: none Tremor: moderate, with patient's arms extended Auditory Disturbances: not present Paroxysmal Sweats: barely perceptible sweating, palms moist Visual Disturbances: not present Anxiety: mildly anxious Headache, Fullness in Head: none present Agitation: normal activity Orientation and Clouding of Sensorium: cannot do serial additions or is uncertain about date CIWA-Ar Total: 7 COWS:    Allergies:  Allergies  Allergen Reactions  . Doxycycline Swelling    Mouth swelling and sores  . Codeine Itching  . Tetracycline Hcl Swelling    Mouth swelling and sores    Home Medications: (Not in a hospital admission)   OB/GYN Status:  No LMP recorded. Patient is postmenopausal.  General Assessment Data Location of Assessment: [facility] ED TTS Assessment: In system Is this a Tele or Face-to-Face Assessment?: Tele Assessment Is this an Initial Assessment or a Re-assessment for this encounter?: Initial Assessment Patient Accompanied by:: N/A Language Other than English: No Living Arrangements: Other (Comment) What gender do you identify as?: Female Marital status: Single Pregnancy Status: No Living Arrangements: Alone Can pt return to current living arrangement?: Yes Admission Status: Voluntary Is patient capable of signing voluntary admission?: Yes Referral Source: Self/Family/Friend Insurance type: [place] Living Arrangements: Alone Name of Psychiatrist: none Name of  Therapist: none  Education Status Is patient currently in school?: No Is the patient employed, unemployed or receiving disability?: (retired)  Risk to self with the past 6 months Suicidal Ideation: No Has patient been a risk to self within the past 6 months prior to admission? : Yes(falling due to alcohol intoxication) Suicidal Intent: No Has patient had any suicidal intent within the past 6 months prior to admission? : No Is patient at risk for suicide?: No Suicidal Plan?: No Has patient had any suicidal plan within the past 6 months prior to admission? : No Access to Means: No What has been your use of drugs/alcohol within the last 12 months?: excessive alcohol use  Previous Attempts/Gestures: No Triggers for Past Attempts: None known Intentional Self Injurious Behavior: None Family Suicide History: No Recent stressful life event(s): Other (Comment)(substance abuse) Persecutory voices/beliefs?: No Depression: Yes Depression Symptoms: Isolating, Despondent Substance abuse history and/or treatment for substance abuse?: Yes Suicide prevention information given to non-admitted patients: Not applicable  Risk to Others within the past 6 months Homicidal Ideation: No Does patient have any lifetime risk of violence toward others beyond the six months prior to admission? : No Thoughts of Harm to Others: No  Current Homicidal Intent: No Current Homicidal Plan: No Access to Homicidal Means: No History of harm to others?: No Assessment of Violence: None Noted Does patient have access to weapons?: No Criminal Charges Pending?: No Does patient have a court date: No Is patient on probation?: No  Psychosis Hallucinations: None noted Delusions: None noted  Mental Status Report Appearance/Hygiene: Unremarkable, In scrubs Eye Contact: Good Motor Activity: Freedom of movement Speech: Logical/coherent Level of Consciousness: Alert Mood: Euthymic Affect: Appropriate to  circumstance Anxiety Level: None Thought Processes: Relevant, Coherent Judgement: Partial Orientation: Person, Situation, Place, Time, Appropriate for developmental age Obsessive Compulsive Thoughts/Behaviors: None  Cognitive Functioning Concentration: Normal Memory: Remote Intact, Recent Intact Is patient IDD: No Insight: Fair Impulse Control: Fair Appetite: Poor Have you had any weight changes? : No Change Sleep: Decreased Total Hours of Sleep: 5 Vegetative Symptoms: None  ADLScreening [facility] Assessment Services) Patient's cognitive ability adequate to safely complete daily activities?: Yes Patient able to express need for assistance with ADLs?: Yes Independently performs ADLs?: Yes (appropriate for developmental age)  Prior Inpatient Therapy Prior Inpatient Therapy: Yes Prior Therapy Dates: 2017, 2016 Prior Therapy Facilty/Provider(s): [facility], [facility] Reason for Treatment: MDD, ALCOHOL ABUSE   Prior Outpatient Therapy Prior Outpatient Therapy: Yes Prior Therapy Dates: 2019 Prior Therapy Facilty/Provider(s): [facility]  Reason for Treatment: ALCOHOL ABUSE Does patient have an ACCT team?: No Does patient have Intensive In-House Services?  : No Does patient have [facility] services? : No Does patient have P4CC services?: No  ADL Screening (condition at time of admission) Patient's cognitive ability adequate to safely complete daily activities?: Yes Is the patient deaf or have difficulty hearing?: No Does the patient have difficulty seeing, even when wearing glasses/contacts?: No Does the patient have difficulty concentrating, remembering, or making decisions?: No Patient able to express need for assistance with ADLs?: Yes Does the patient have difficulty dressing or bathing?: No Independently performs ADLs?: Yes (appropriate for developmental age) Does the patient have difficulty walking or climbing stairs?: No Weakness of Legs: None Weakness of Arms/Hands: None  Home  Assistive Devices/Equipment Home Assistive Devices/Equipment: None    Abuse/Neglect Assessment (Assessment to be complete while patient is alone) Abuse/Neglect Assessment Can Be Completed: Yes Physical Abuse: Denies Verbal Abuse: Denies Sexual Abuse: Denies Exploitation of patient/patient's resources: Denies Self-Neglect: Denies     Regulatory affairs officer (For Healthcare) Does Patient Have a Medical Advance Directive?: No Would patient like information on creating a medical advance directive?: No - Patient declined          Disposition: Lindon Romp, NP recommends continued observation for safety and stabilization and to be reassessed in the AM by psych with a peer support consult requested to assist with alcohol dependence issues. Chrisco, [name], RN has been advised. EDP McDonald, Mia A, PA-C has been advised.  Disposition Initial Assessment Completed for this Encounter: Yes Disposition of Patient: (overnight OBS pending AM psych assessment) Patient refused recommended treatment: No  This service was provided via telemedicine using a 2-way, interactive audio and video technology.  Names of all persons participating in this telemedicine service and their role in this encounter. Name: Terri Ayala Role: Patient  Name: Lind Covert Role: TTS          Lyanne Co 05/05/2018 1:09 AM

## 2018-05-04 NOTE — ED Notes (Signed)
The pt rep[orts that she is going throiugh alcohol withdrawal  Her last drink was this am.  C/o chest pain and weakness

## 2018-05-05 ENCOUNTER — Other Ambulatory Visit: Payer: Self-pay

## 2018-05-05 ENCOUNTER — Encounter (HOSPITAL_COMMUNITY): Payer: Self-pay | Admitting: Radiology

## 2018-05-05 ENCOUNTER — Emergency Department (HOSPITAL_COMMUNITY): Payer: Federal, State, Local not specified - PPO

## 2018-05-05 ENCOUNTER — Observation Stay (HOSPITAL_COMMUNITY): Payer: Federal, State, Local not specified - PPO

## 2018-05-05 ENCOUNTER — Observation Stay (HOSPITAL_BASED_OUTPATIENT_CLINIC_OR_DEPARTMENT_OTHER): Payer: Federal, State, Local not specified - PPO

## 2018-05-05 DIAGNOSIS — I361 Nonrheumatic tricuspid (valve) insufficiency: Secondary | ICD-10-CM

## 2018-05-05 DIAGNOSIS — F10239 Alcohol dependence with withdrawal, unspecified: Principal | ICD-10-CM

## 2018-05-05 DIAGNOSIS — F1020 Alcohol dependence, uncomplicated: Secondary | ICD-10-CM | POA: Diagnosis not present

## 2018-05-05 DIAGNOSIS — F10230 Alcohol dependence with withdrawal, uncomplicated: Secondary | ICD-10-CM | POA: Diagnosis not present

## 2018-05-05 DIAGNOSIS — R7989 Other specified abnormal findings of blood chemistry: Secondary | ICD-10-CM | POA: Diagnosis not present

## 2018-05-05 DIAGNOSIS — S2241XD Multiple fractures of ribs, right side, subsequent encounter for fracture with routine healing: Secondary | ICD-10-CM | POA: Diagnosis not present

## 2018-05-05 DIAGNOSIS — R079 Chest pain, unspecified: Secondary | ICD-10-CM

## 2018-05-05 DIAGNOSIS — E876 Hypokalemia: Secondary | ICD-10-CM

## 2018-05-05 LAB — PHOSPHORUS: Phosphorus: 3.4 mg/dL (ref 2.5–4.6)

## 2018-05-05 LAB — COMPREHENSIVE METABOLIC PANEL
ALT: 33 U/L (ref 0–44)
AST: 89 U/L — ABNORMAL HIGH (ref 15–41)
Albumin: 3.1 g/dL — ABNORMAL LOW (ref 3.5–5.0)
Alkaline Phosphatase: 113 U/L (ref 38–126)
Anion gap: 18 — ABNORMAL HIGH (ref 5–15)
BILIRUBIN TOTAL: 1.4 mg/dL — AB (ref 0.3–1.2)
BUN: 20 mg/dL (ref 8–23)
CO2: 25 mmol/L (ref 22–32)
Calcium: 9.2 mg/dL (ref 8.9–10.3)
Chloride: 91 mmol/L — ABNORMAL LOW (ref 98–111)
Creatinine, Ser: 1.13 mg/dL — ABNORMAL HIGH (ref 0.44–1.00)
GFR calc Af Amer: 60 mL/min — ABNORMAL LOW (ref >60)
GFR calc non Af Amer: 52 mL/min — ABNORMAL LOW (ref >60)
Glucose, Bld: 84 mg/dL (ref 70–99)
POTASSIUM: 2.9 mmol/L — AB (ref 3.5–5.1)
Sodium: 134 mmol/L — ABNORMAL LOW (ref 135–145)
Total Protein: 5.8 g/dL — ABNORMAL LOW (ref 6.5–8.1)

## 2018-05-05 LAB — LIPASE, BLOOD: Lipase: 79 U/L — ABNORMAL HIGH (ref 11–51)

## 2018-05-05 LAB — D-DIMER, QUANTITATIVE: D-Dimer, Quant: 1.25 ug/mL-FEU — ABNORMAL HIGH (ref 0.00–0.50)

## 2018-05-05 LAB — ETHANOL

## 2018-05-05 LAB — ECHOCARDIOGRAM COMPLETE
Height: 61 in
Weight: 2261.04 oz

## 2018-05-05 LAB — I-STAT TROPONIN, ED: TROPONIN I, POC: 0.01 ng/mL (ref 0.00–0.08)

## 2018-05-05 LAB — MAGNESIUM: MAGNESIUM: 1.5 mg/dL — AB (ref 1.7–2.4)

## 2018-05-05 MED ORDER — PROPRANOLOL HCL 10 MG PO TABS
10.0000 mg | ORAL_TABLET | Freq: Two times a day (BID) | ORAL | Status: DC
  Administered 2018-05-05 – 2018-05-08 (×4): 10 mg via ORAL
  Filled 2018-05-05 (×8): qty 1

## 2018-05-05 MED ORDER — FOLIC ACID 1 MG PO TABS
1.0000 mg | ORAL_TABLET | Freq: Every day | ORAL | Status: DC
  Administered 2018-05-05 – 2018-05-08 (×4): 1 mg via ORAL
  Filled 2018-05-05 (×4): qty 1

## 2018-05-05 MED ORDER — MAGNESIUM SULFATE 2 GM/50ML IV SOLN
2.0000 g | Freq: Once | INTRAVENOUS | Status: AC
  Administered 2018-05-05: 2 g via INTRAVENOUS
  Filled 2018-05-05: qty 50

## 2018-05-05 MED ORDER — ACETAMINOPHEN 325 MG PO TABS: 650.0000 mg | ORAL_TABLET | Freq: Four times a day (QID) | ORAL | Status: DC | PRN

## 2018-05-05 MED ORDER — LORAZEPAM 2 MG/ML IJ SOLN: 1.0000 mg | Freq: Four times a day (QID) | INTRAMUSCULAR | Status: AC | PRN

## 2018-05-05 MED ORDER — BUSPIRONE HCL 15 MG PO TABS
15.0000 mg | ORAL_TABLET | Freq: Three times a day (TID) | ORAL | Status: DC
  Administered 2018-05-05 – 2018-05-08 (×9): 15 mg via ORAL
  Filled 2018-05-05 (×2): qty 3
  Filled 2018-05-05 (×2): qty 1
  Filled 2018-05-05 (×5): qty 3
  Filled 2018-05-05: qty 1

## 2018-05-05 MED ORDER — PANTOPRAZOLE SODIUM 40 MG PO TBEC
40.0000 mg | DELAYED_RELEASE_TABLET | Freq: Every day | ORAL | Status: DC
  Administered 2018-05-05 – 2018-05-06 (×2): 40 mg via ORAL
  Filled 2018-05-05 (×2): qty 1

## 2018-05-05 MED ORDER — VITAMIN B-1 100 MG PO TABS
100.0000 mg | ORAL_TABLET | Freq: Every day | ORAL | Status: DC
  Administered 2018-05-05 – 2018-05-07 (×3): 100 mg via ORAL
  Filled 2018-05-05 (×5): qty 1

## 2018-05-05 MED ORDER — IOPAMIDOL (ISOVUE-370) INJECTION 76%
100.0000 mL | Freq: Once | INTRAVENOUS | Status: AC | PRN
  Administered 2018-05-05: 100 mL via INTRAVENOUS

## 2018-05-05 MED ORDER — NORETHINDRONE-ETH ESTRADIOL 1-5 MG-MCG PO TABS
1.0000 | ORAL_TABLET | Freq: Every day | ORAL | Status: DC
  Administered 2018-05-06 – 2018-05-07 (×2): 1 via ORAL

## 2018-05-05 MED ORDER — HYDROXYZINE HCL 25 MG PO TABS
25.0000 mg | ORAL_TABLET | Freq: Two times a day (BID) | ORAL | Status: DC
  Administered 2018-05-05 – 2018-05-08 (×7): 25 mg via ORAL
  Filled 2018-05-05 (×7): qty 1

## 2018-05-05 MED ORDER — THIAMINE HCL 100 MG/ML IJ SOLN
100.0000 mg | Freq: Every day | INTRAMUSCULAR | Status: DC
  Filled 2018-05-05: qty 1

## 2018-05-05 MED ORDER — ADULT MULTIVITAMIN W/MINERALS CH: 1.0000 | ORAL_TABLET | Freq: Every day | ORAL | Status: DC

## 2018-05-05 MED ORDER — ONDANSETRON HCL 4 MG/2ML IJ SOLN: 4.0000 mg | Freq: Four times a day (QID) | INTRAMUSCULAR | Status: DC | PRN

## 2018-05-05 MED ORDER — ACETAMINOPHEN 650 MG RE SUPP: 650.0000 mg | Freq: Four times a day (QID) | RECTAL | Status: DC | PRN

## 2018-05-05 MED ORDER — TRAZODONE HCL 50 MG PO TABS: 50.0000 mg | ORAL_TABLET | Freq: Every evening | ORAL | Status: DC | PRN

## 2018-05-05 MED ORDER — DULOXETINE HCL 60 MG PO CPEP
60.0000 mg | ORAL_CAPSULE | Freq: Two times a day (BID) | ORAL | Status: DC
  Administered 2018-05-05 – 2018-05-08 (×7): 60 mg via ORAL
  Filled 2018-05-05 (×9): qty 1

## 2018-05-05 MED ORDER — ONDANSETRON HCL 4 MG PO TABS
4.0000 mg | ORAL_TABLET | Freq: Four times a day (QID) | ORAL | Status: DC | PRN
  Administered 2018-05-08: 09:00:00 4 mg via ORAL
  Filled 2018-05-05: qty 1

## 2018-05-05 MED ORDER — ASPIRIN EC 81 MG PO TBEC
81.0000 mg | DELAYED_RELEASE_TABLET | Freq: Every day | ORAL | Status: DC
  Administered 2018-05-05 – 2018-05-08 (×4): 81 mg via ORAL
  Filled 2018-05-05 (×4): qty 1

## 2018-05-05 MED ORDER — POTASSIUM CHLORIDE CRYS ER 20 MEQ PO TBCR
40.0000 meq | EXTENDED_RELEASE_TABLET | Freq: Once | ORAL | Status: AC
  Administered 2018-05-05: 40 meq via ORAL
  Filled 2018-05-05: qty 2

## 2018-05-05 MED ORDER — POTASSIUM CHLORIDE 10 MEQ/100ML IV SOLN
10.0000 meq | INTRAVENOUS | Status: AC
  Administered 2018-05-05 (×2): 10 meq via INTRAVENOUS
  Filled 2018-05-05 (×2): qty 100

## 2018-05-05 MED ORDER — ENOXAPARIN SODIUM 40 MG/0.4ML ~~LOC~~ SOLN
40.0000 mg | SUBCUTANEOUS | Status: DC
  Administered 2018-05-05 – 2018-05-06 (×2): 40 mg via SUBCUTANEOUS
  Filled 2018-05-05 (×2): qty 0.4

## 2018-05-05 MED ORDER — LORAZEPAM 1 MG PO TABS: 1.0000 mg | ORAL_TABLET | Freq: Four times a day (QID) | ORAL | Status: AC | PRN

## 2018-05-05 MED ORDER — ADULT MULTIVITAMIN W/MINERALS CH
1.0000 | ORAL_TABLET | Freq: Every day | ORAL | Status: DC
  Administered 2018-05-05 – 2018-05-08 (×4): 1 via ORAL
  Filled 2018-05-05 (×4): qty 1

## 2018-05-05 NOTE — Progress Notes (Signed)
Lindon Romp, NP recommends continued observation for safety and stabilization and to be reassessed in the AM by psych with a peer support consult requested to assist with alcohol dependence issues. Chrisco, [name], RN has been advised. Attempted to inform the EDP who is currently unavailable.  Lind Covert, MSW, LCSW Therapeutic Triage Specialist  [phone]

## 2018-05-05 NOTE — BH Assessment (Signed)
Pt has been medically admitted. Notified 5W staff that pt will need psych consult to follow up.

## 2018-05-05 NOTE — ED Notes (Signed)
tts completed

## 2018-05-05 NOTE — H&P (Signed)
History and Physical    Terri Ayala NLG:[id number] DOB: Mar 13, 1955 DOA: 05/04/2018  PCP: Patient, No Pcp Per  Patient coming from: Home  I have personally briefly reviewed patient's old medical records in [place]  Chief Complaint: Detox  HPI: Terri Ayala is a 63 y.o. female with medical history significant of ongoing EtOH abuse, prior withdrawal with DTs, GERD.  Patient presents to the ED with c/o EtOH withdrawal.  She reports she typically drinks 1 to 1.5 bottles of wine daily.  She has been cutting back in recent days.  Last glass of wine at 10am.  Only had 1 to 2 glasses daily over past week secondary to N/V daily.   ED Course: Patient initially tachy to 120s, tremulous and had CIWA of 7 initially.  This improved with ativan.  Hospitalist asked to admit.   Review of Systems: As per HPI otherwise 10 point review of systems negative.   Past Medical History:  Diagnosis Date  . Alcohol withdrawal ([place]) 11/2017; 01/04/2018  . Alcoholism ([place])   . Anxiety   . Arthritis    "hands" (01/04/2018)  . ASYMPTOMATIC POSTMENOPAUSAL STATUS 08/17/2009  . Chronic lower back pain   . Depression   . DYSPHAGIA 06/19/2007  . Fatty liver, alcoholic   . GERD (gastroesophageal reflux disease)   . GOITER, MULTINODULAR 08/17/2009  . Hyperlipidemia   . Migraines    "not sure what triggers them; I'll have 1 q couple weeks or more; come 2 days in a row when they come" (01/04/2018)  . Mitral valve prolapse   . OSTEOPOROSIS 08/17/2009  . PONV (postoperative nausea and vomiting)   . Supraventricular tachycardia [facility])     Past Surgical History:  Procedure Laterality Date  . BREAST BIOPSY Right    "benign"  . CATARACT EXTRACTION W/ INTRAOCULAR LENS  IMPLANT, BILATERAL    . CERVICAL CONE BIOPSY  2000s  . CERVIX LESION DESTRUCTION  1991   "dysplasia; lesions"  . CHOLECYSTECTOMY N/A 03/14/2013   Procedure: LAPAROSCOPIC CHOLECYSTECTOMY WITH ATTEMPTED INTRAOPERATIVE CHOLANGIOGRAM;  Surgeon:  Harl Bowie, MD;  Location: [place];  Service: General;  Laterality: N/A;  . laporoscopic abdominal surgery     "for endometriosis"     reports that she has quit smoking. She has never used smokeless tobacco. She reports current alcohol use of about 63.0 standard drinks of alcohol per week. She reports that she does not use drugs.  Allergies  Allergen Reactions  . Doxycycline Swelling    Mouth swelling and sores  . Codeine Itching  . Tetracycline Hcl Swelling    Mouth swelling and sores    Family History  Problem Relation Age of Onset  . Colon cancer Father        Deceased, 40  . Osteoporosis Mother        Living, 30  . Healthy Brother   . Thyroid disease Neg Hx   . Goiter Neg Hx      Prior to Admission medications   Medication Sig Start Date End Date Taking? Authorizing Provider  alendronate (FOSAMAX) 70 MG tablet Take 70 mg by mouth every Monday. Take with a full glass of water on an empty stomach.    Yes [provider]  aspirin EC 81 MG tablet Take 81 mg by mouth daily.   Yes [provider]  busPIRone (BUSPAR) 15 MG tablet Take 15 mg by mouth 3 (three) times daily.   Yes [provider]  diclofenac sodium (VOLTAREN) 1 %  GEL Apply 2 g topically 4 (four) times daily. Patient taking differently: Apply 2 g topically 4 (four) times daily as needed (pain).  12/19/17  Yes Yu, Amy V, PA-C  DULoxetine (CYMBALTA) 60 MG capsule TAKE 1 CAPSULE (60 MG TOTAL) BY MOUTH DAILY. Patient taking differently: Take 60 mg by mouth 2 (two) times daily.  10/23/14  Yes Denita Lung, MD  hydrOXYzine (ATARAX/VISTARIL) 25 MG tablet Take 25 mg by mouth 2 (two) times daily.    Yes [provider]  lidocaine (LIDODERM) 5 % Place 1 patch onto the skin daily. Remove & Discard patch within 12 hours or as directed by MD Patient taking differently: Place 1 patch onto the skin daily as needed (lower back pain). Remove & Discard patch within 12 hours or as directed by  MD 12/19/17  Yes Yu, Amy V, PA-C  Multiple Vitamin (MULTIVITAMIN WITH MINERALS) TABS tablet Take 1 tablet by mouth daily. 01/10/18  Yes Mikhail, [place], DO  norethindrone-ethinyl estradiol (FEMHRT 1/5) 1-5 MG-MCG TABS Take 1 tablet by mouth daily. For Osteoporosis 04/07/14  Yes Lindell Spar I, NP  ondansetron (ZOFRAN ODT) 4 MG disintegrating tablet Take 1 tablet (4 mg total) by mouth every 8 (eight) hours as needed for nausea or vomiting. 09/01/17  Yes Henson, Vickie L, NP-C  pantoprazole (PROTONIX) 40 MG tablet Take 1 tablet (40 mg total) by mouth daily at 12 noon. Patient taking differently: Take 40 mg by mouth daily as needed (heartburn).  11/09/17  Yes Ghimire, Henreitta Leber, MD  propranolol (INDERAL) 10 MG tablet Take 10 mg by mouth 2 (two) times daily.    Yes [provider]  SUMAtriptan (IMITREX) 100 MG tablet Take 1 tablet at the first sign of migraine.  May repeat in 2 hours. Patient taking differently: Take 100 mg by mouth every 2 (two) hours as needed for migraine.  07/31/17  Yes Denita Lung, MD  thiamine 100 MG tablet Take 1 tablet (100 mg total) by mouth daily. 11/09/17  Yes Ghimire, Henreitta Leber, MD  traZODone (DESYREL) 50 MG tablet Take 50 mg by mouth at bedtime as needed for sleep.    Yes [provider]    Physical Exam: Vitals:   05/04/18 2229 05/04/18 2229 05/05/18 0001 05/05/18 0015  BP:  124/80 139/82 136/79  Pulse: (!) 112 (!) 111 (!) 108 (!) 118  Resp:   20 (!) 21  Temp:      TempSrc:      SpO2:   99% 100%    Constitutional: NAD, calm, comfortable Eyes: PERRL, lids and conjunctivae normal ENMT: Mucous membranes are moist. Posterior pharynx clear of any exudate or lesions.Normal dentition.  Neck: normal, supple, no masses, no thyromegaly Respiratory: clear to auscultation bilaterally, no wheezing, no crackles. Normal respiratory effort. No accessory muscle use.  Cardiovascular: Tachycardic, regular Abdomen: no tenderness, no masses palpated. No  hepatosplenomegaly. Bowel sounds positive.  Musculoskeletal: no clubbing / cyanosis. No joint deformity upper and lower extremities. Good ROM, no contractures. Normal muscle tone.  Skin: no rashes, lesions, ulcers. No induration Neurologic: CN 2-12 grossly intact. Sensation intact, DTR normal. Strength 5/5 in all 4.  Psychiatric: Normal judgment and insight. Alert and oriented x 3. Normal mood.    Labs on Admission: I have personally reviewed following labs and imaging studies  CBC: Recent Labs  Lab 05/04/18 1735  WBC 7.3  HGB 13.3  HCT 38.9  MCV 94.4  PLT 160   Basic Metabolic Panel: Recent Labs  Lab 05/04/18  1735  NA 133*  K 4.2  CL 85*  CO2 27  GLUCOSE 108*  BUN 21  CREATININE 1.34*  CALCIUM 10.9*   GFR: CrCl cannot be calculated (Unknown ideal weight.). Liver Function Tests: No results for input(s): AST, ALT, ALKPHOS, BILITOT, PROT, ALBUMIN in the last 168 hours. No results for input(s): LIPASE, AMYLASE in the last 168 hours. No results for input(s): AMMONIA in the last 168 hours. Coagulation Profile: No results for input(s): INR, PROTIME in the last 168 hours. Cardiac Enzymes: No results for input(s): CKTOTAL, CKMB, CKMBINDEX, TROPONINI in the last 168 hours. BNP (last 3 results) No results for input(s): PROBNP in the last 8760 hours. HbA1C: No results for input(s): HGBA1C in the last 72 hours. CBG: No results for input(s): GLUCAP in the last 168 hours. Lipid Profile: No results for input(s): CHOL, HDL, LDLCALC, TRIG, CHOLHDL, LDLDIRECT in the last 72 hours. Thyroid Function Tests: No results for input(s): TSH, T4TOTAL, FREET4, T3FREE, THYROIDAB in the last 72 hours. Anemia Panel: No results for input(s): VITAMINB12, FOLATE, FERRITIN, TIBC, IRON, RETICCTPCT in the last 72 hours. Urine analysis:    Component Value Date/Time   COLORURINE YELLOW 01/04/2018 1842   APPEARANCEUR CLEAR 01/04/2018 1842   LABSPEC 1.010 01/04/2018 1842   LABSPEC 1.030 09/01/2017  1443   PHURINE 7.0 01/04/2018 1842   GLUCOSEU NEGATIVE 01/04/2018 1842   HGBUR SMALL (A) 01/04/2018 1842   BILIRUBINUR NEGATIVE 01/04/2018 1842   BILIRUBINUR moderate (A) 09/01/2017 1443   KETONESUR 5 (A) 01/04/2018 1842   PROTEINUR NEGATIVE 01/04/2018 1842   UROBILINOGEN 0.2 07/25/2007 0900   NITRITE NEGATIVE 01/04/2018 1842   LEUKOCYTESUR NEGATIVE 01/04/2018 1842    Radiological Exams on Admission: Dg Chest 2 View  Result Date: 05/04/2018 CLINICAL DATA:  Chest pain EXAM: CHEST - 2 VIEW COMPARISON:  02/22/2018 FINDINGS: No acute consolidation or effusion. Normal heart size. No pneumothorax. There are small nodular opacities in the right mid lung. There is a healing right eighth rib fracture. IMPRESSION: 1. No acute pulmonary infiltrate. 2. Small nodular opacities in the right mid lung. Consider chest CT for further evaluation. Electronically Signed   By: Donavan Foil M.D.   On: 05/04/2018 20:49   Ct Chest Wo Contrast  Result Date: 05/05/2018 CLINICAL DATA:  63 year old female with dyspnea. EXAM: CT CHEST WITHOUT CONTRAST TECHNIQUE: Multidetector CT imaging of the chest was performed following the standard protocol without IV contrast. COMPARISON:  Same day CXR FINDINGS: Cardiovascular: Normal heart size without pericardial effusion. Coronary arteriosclerosis along the main and LAD. Nonaneurysmal minimally atherosclerotic thoracic aorta. The unenhanced pulmonary arteries unremarkable. Mediastinum/Nodes: No enlarged mediastinal or axillary lymph nodes. Thyroid gland, trachea, and esophagus demonstrate no significant findings. Lungs/Pleura: Streaky scarring and/or atelectasis in the right upper lobe. No dominant mass, effusion or consolidations. Apical pleuroparenchymal scarring is noted bilaterally. Upper Abdomen: Steatosis of liver. Cholecystectomy clips are present. No adrenal mass. Musculoskeletal: Healing right eighth and ninth rib fractures with callus as well as costochondral calcifications  on the right are believed to account for the nodular density seen on the earlier same day chest radiograph. IMPRESSION: No pulmonary nodule identified. The nodular densities seen on earlier same day chest radiograph are felt secondary to costochondral calcifications as well as callus from prior eighth and ninth posterior rib fractures. Mild coronary arteriosclerosis. Hepatic steatosis. Aortic Atherosclerosis (ICD10-I70.0). Electronically Signed   By: Ashley Royalty M.D.   On: 05/05/2018 00:47    EKG: Independently reviewed.  Assessment/Plan Principal Problem:   Alcohol withdrawal (  [place]) Active Problems:   Alcohol use disorder, severe, dependence ([place])    1. EtOH withdrawal - 1. CIWA 2. Tele monitor 3. Banana bag 4. TTS eval 5. SW consult 2. H/o SVT - 1. Continue chronic propranolol 3. N/V - 1. Check CMP this AM (for LFTs) 2. Check Lipase  DVT prophylaxis: Lovenox Code Status: Full Family Communication: No family in room Disposition Plan: Home after admit Consults called: None Admission status: Place in 67     Abbas Beyene, [place] [facility]  How to contact the [facility] Attending or Consulting provider [place] or covering provider during after hours [place], for this patient?  1. Check the care team in [facility] and look for a) attending/consulting TRH provider listed and b) the [facility] team listed 2. Log into [email or web address]  Amion Physician Scheduling and messaging for groups and whole hospitals  On call and physician scheduling software for group practices, residents, hospitalists and other medical providers for call, clinic, rotation and shift schedules. On[employer] is a hospital-wide system for scheduling doctors and paging doctors on call. EasyPlot is for scientific plotting and data analysis.  [email or web address]  and use [place]'s universal password to access. If you do not have the password, please contact the hospital operator.  3. Locate the [facility] provider you are looking  for under [facility] and page to a number that you can be directly reached. 4. If you still have difficulty reaching the provider, please page the [facility] (Director on Call) for the Hospitalists listed on amion for assistance.  05/05/2018, 3:22 AM

## 2018-05-05 NOTE — Progress Notes (Addendum)
PROGRESS NOTE        PATIENT DETAILS Name: Terri Ayala Age: 63 y.o. Sex: female Date of Birth: April 20, 1955 Admit Date: 05/04/2018 Admitting Physician Etta Quill, DO LSL:[id number], No Pcp Per  Brief Narrative: Patient is a 63 y.o. female with history of EtOH use, depression, SVT evaluation of chest pain and alcohol withdrawal symptoms.  See below for further details  Subjective: Lying comfortably in bed-denies any chest pain or shortness of breath.  Assessment/Plan: Chest pain: Suspect atypical-could be related to alcohol related gastritis.  CTA chest negative for PE, EKG nonacute, troponins not elevated.  Check echocardiogram-if no major abnormalities-suspect does not need further inpatient evaluation.  Change PPI to daily and follow.  Nausea/vomiting: Resolved, lipase very minimally elevated ?  Significance-abdominal exam is completely benign-nontender abdomen.  Supportive care-seems to be tolerating diet well.  Follow.  History of SVT: Continue propanolol  Depression: Appears to have a flat affect-continue BuSpar, Cymbalta was being followed by psychiatry team in the ED-have placed a formal inpatient consult for psychiatry.  EtOH use with withdrawal symptoms: Last drink was 3/6 morning-she is awake and alert-mild tremors-continue Ativan per protocol.  Hypokalemia/hypomagnesemia: Likely secondary to alcohol use-replete and recheck  DVT Prophylaxis: Prophylactic Lovenox   Code Status: Full code  Family Communication: None at bedside  Disposition Plan: Remain inpatient  Antimicrobial agents: Anti-infectives (From admission, onward)   None      Procedures: None  CONSULTS:  None  Time spent: 25- minutes-Greater than 50% of this time was spent in counseling, explanation of diagnosis, planning of further management, and coordination of care.  MEDICATIONS: Scheduled Meds: . aspirin EC  81 mg Oral Daily  . busPIRone  15 mg Oral TID  .  DULoxetine  60 mg Oral BID  . enoxaparin (LOVENOX) injection  40 mg Subcutaneous Q24H  . folic acid  1 mg Oral Daily  . hydrOXYzine  25 mg Oral BID  . LORazepam  0-4 mg Intravenous Q6H   Or  . LORazepam  0-4 mg Oral Q6H  . [START ON 05/07/2018] LORazepam  0-4 mg Intravenous Q12H   Or  . [START ON 05/07/2018] LORazepam  0-4 mg Oral Q12H  . multivitamin with minerals  1 tablet Oral Daily  . norethindrone-ethinyl estradiol  1 tablet Oral Daily  . pantoprazole  40 mg Oral Q1200  . propranolol  10 mg Oral BID  . sodium chloride flush  3 mL Intravenous Once  . thiamine  100 mg Oral Daily   Or  . thiamine  100 mg Intravenous Daily   Continuous Infusions: PRN Meds:.acetaminophen **OR** acetaminophen, LORazepam **OR** LORazepam, ondansetron **OR** ondansetron (ZOFRAN) IV, traZODone   PHYSICAL EXAM: Vital signs: Vitals:   05/05/18 0315 05/05/18 0400 05/05/18 0552 05/05/18 0809  BP: 100/70 104/62 110/71 109/73  Pulse: 98  (!) 101 (!) 102  Resp: 19 (!) 21 18   Temp:   98.1 F (36.7 C)   TempSrc:   Oral   SpO2: 98%  100%   Weight:      Height:       Filed Weights   05/04/18 0400  Weight: 64.1 kg   Body mass index is 26.7 kg/m.   General appearance :Awake, alert, not in any distress.  HEENT: Atraumatic and Normocephalic Neck: supple, no JVD. No cervical lymphadenopathy. No thyromegaly Resp:Good air entry bilaterally, no added  sounds  CVS: S1 S2 regular, no murmurs.  GI: Bowel sounds present, Non tender and not distended with no gaurding, rigidity or rebound.No organomegaly Extremities: B/L Lower Ext shows no edema, both legs are warm to touch Neurology:  speech clear,Non focal, sensation is grossly intact. Musculoskeletal:No digital cyanosis Skin:No Rash, warm and dry Wounds:N/A  I have personally reviewed following labs and imaging studies  LABORATORY DATA: CBC: Recent Labs  Lab 05/04/18 1735  WBC 7.3  HGB 13.3  HCT 38.9  MCV 94.4  PLT 662    Basic Metabolic  Panel: Recent Labs  Lab 05/04/18 1735 05/05/18 0340  NA 133* 134*  K 4.2 2.9*  CL 85* 91*  CO2 27 25  GLUCOSE 108* 84  BUN 21 20  CREATININE 1.34* 1.13*  CALCIUM 10.9* 9.2  MG  --  1.5*  PHOS  --  3.4    GFR: Estimated Creatinine Clearance: 43.7 mL/min (A) (by C-G formula based on SCr of 1.13 mg/dL (H)).  Liver Function Tests: Recent Labs  Lab 05/05/18 0340  AST 89*  ALT 33  ALKPHOS 113  BILITOT 1.4*  PROT 5.8*  ALBUMIN 3.1*   Recent Labs  Lab 05/05/18 0340  LIPASE 79*   No results for input(s): AMMONIA in the last 168 hours.  Coagulation Profile: No results for input(s): INR, PROTIME in the last 168 hours.  Cardiac Enzymes: No results for input(s): CKTOTAL, CKMB, CKMBINDEX, TROPONINI in the last 168 hours.  BNP (last 3 results) No results for input(s): PROBNP in the last 8760 hours.  HbA1C: No results for input(s): HGBA1C in the last 72 hours.  CBG: No results for input(s): GLUCAP in the last 168 hours.  Lipid Profile: No results for input(s): CHOL, HDL, LDLCALC, TRIG, CHOLHDL, LDLDIRECT in the last 72 hours.  Thyroid Function Tests: No results for input(s): TSH, T4TOTAL, FREET4, T3FREE, THYROIDAB in the last 72 hours.  Anemia Panel: No results for input(s): VITAMINB12, FOLATE, FERRITIN, TIBC, IRON, RETICCTPCT in the last 72 hours.  Urine analysis:    Component Value Date/Time   COLORURINE YELLOW 01/04/2018 1842   APPEARANCEUR CLEAR 01/04/2018 1842   LABSPEC 1.010 01/04/2018 1842   LABSPEC 1.030 09/01/2017 1443   PHURINE 7.0 01/04/2018 1842   GLUCOSEU NEGATIVE 01/04/2018 1842   HGBUR SMALL (A) 01/04/2018 1842   BILIRUBINUR NEGATIVE 01/04/2018 1842   BILIRUBINUR moderate (A) 09/01/2017 1443   KETONESUR 5 (A) 01/04/2018 1842   PROTEINUR NEGATIVE 01/04/2018 1842   UROBILINOGEN 0.2 07/25/2007 0900   NITRITE NEGATIVE 01/04/2018 1842   LEUKOCYTESUR NEGATIVE 01/04/2018 1842    Sepsis Labs: Lactic Acid, Venous No results found for:  LATICACIDVEN  MICROBIOLOGY: No results found for this or any previous visit (from the past 240 hour(s)).  RADIOLOGY STUDIES/RESULTS: Dg Chest 2 View  Result Date: 05/04/2018 CLINICAL DATA:  Chest pain EXAM: CHEST - 2 VIEW COMPARISON:  02/22/2018 FINDINGS: No acute consolidation or effusion. Normal heart size. No pneumothorax. There are small nodular opacities in the right mid lung. There is a healing right eighth rib fracture. IMPRESSION: 1. No acute pulmonary infiltrate. 2. Small nodular opacities in the right mid lung. Consider chest CT for further evaluation. Electronically Signed   By: Donavan Foil M.D.   On: 05/04/2018 20:49   Ct Chest Wo Contrast  Result Date: 05/05/2018 CLINICAL DATA:  63 year old female with dyspnea. EXAM: CT CHEST WITHOUT CONTRAST TECHNIQUE: Multidetector CT imaging of the chest was performed following the standard protocol without IV contrast. COMPARISON:  Same day  CXR FINDINGS: Cardiovascular: Normal heart size without pericardial effusion. Coronary arteriosclerosis along the main and LAD. Nonaneurysmal minimally atherosclerotic thoracic aorta. The unenhanced pulmonary arteries unremarkable. Mediastinum/Nodes: No enlarged mediastinal or axillary lymph nodes. Thyroid gland, trachea, and esophagus demonstrate no significant findings. Lungs/Pleura: Streaky scarring and/or atelectasis in the right upper lobe. No dominant mass, effusion or consolidations. Apical pleuroparenchymal scarring is noted bilaterally. Upper Abdomen: Steatosis of liver. Cholecystectomy clips are present. No adrenal mass. Musculoskeletal: Healing right eighth and ninth rib fractures with callus as well as costochondral calcifications on the right are believed to account for the nodular density seen on the earlier same day chest radiograph. IMPRESSION: No pulmonary nodule identified. The nodular densities seen on earlier same day chest radiograph are felt secondary to costochondral calcifications as well as  callus from prior eighth and ninth posterior rib fractures. Mild coronary arteriosclerosis. Hepatic steatosis. Aortic Atherosclerosis (ICD10-I70.0). Electronically Signed   By: Ashley Royalty M.D.   On: 05/05/2018 00:47   Ct Angio Chest Pe W And/or Wo Contrast  Result Date: 05/05/2018 CLINICAL DATA:  PE suspected. Intermediate probability. Positive D-dimer. EXAM: CT ANGIOGRAPHY CHEST WITH CONTRAST TECHNIQUE: Multidetector CT imaging of the chest was performed using the standard protocol during bolus administration of intravenous contrast. Multiplanar CT image reconstructions and MIPs were obtained to evaluate the vascular anatomy. CONTRAST:  173mL ISOVUE-370 IOPAMIDOL (ISOVUE-370) INJECTION 76% COMPARISON:  Noncontrast CT of earlier today. Chest radiograph of 1 day prior. FINDINGS: Cardiovascular: The quality of this exam for evaluation of pulmonary embolism is good. The bolus is well timed. No evidence of pulmonary embolism. Normal caliber of the aorta and branch vessels. Normal heart size, without pericardial effusion. Right coronary artery atherosclerosis. Mediastinum/Nodes: No mediastinal or hilar adenopathy. Tiny hiatal hernia. Lungs/Pleura: No pleural fluid. Subsegmental atelectasis or scarring involves the right middle lobe. There is minimal dependent atelectasis which is new compared to earlier in the morning. Upper Abdomen: Marked hepatic steatosis. Normal imaged portions of the spleen. Musculoskeletal: Remote lower right rib fractures Review of the MIP images confirms the above findings. IMPRESSION: 1. No evidence of pulmonary embolism. 2. Marked hepatic steatosis. 3. Age advanced coronary artery atherosclerosis. Recommend assessment of coronary risk factors and consideration of medical therapy. Electronically Signed   By: Abigail Miyamoto M.D.   On: 05/05/2018 09:48     LOS: 0 days   Oren Binet, MD  [facility]  If 7PM-7AM, please contact night-coverage  Please page via  [email or web address]  Go to [email or web address] and use [facility]'s universal password to access. If you do not have the password, please contact the hospital operator.  Locate the [facility] provider you are looking for under [facility] and page to a number that you can be directly reached. If you still have difficulty reaching the provider, please page the [facility] (Director on Call) for the Hospitalists listed on amion for assistance.  05/05/2018, 11:15 AM

## 2018-05-05 NOTE — Progress Notes (Signed)
Pt. transported from ER via stretcher to [id number]; alert and oriented x3- disoriented with time. Oriented to call button and room; pt. sleepy and no c/o's pain.

## 2018-05-05 NOTE — Progress Notes (Signed)
EDP McDonald, Mia A, PA-C has been advised of disposition.  Lind Covert, MSW, LCSW Therapeutic Triage Specialist  [phone]

## 2018-05-05 NOTE — ED Notes (Signed)
Unsuccessful attempt to call report  rn to call me back 

## 2018-05-05 NOTE — Consult Note (Signed)
[facility] Face-to-Face Psychiatry Consult   Reason for Consult:  "Alcohol use, depression. Was being followed by psych in ED prior to admission" Referring Physician:  Dr. Sloan Leiter Patient Identification: Terri Ayala MRN:  [id number] Principal Diagnosis: Alcohol withdrawal ([place]) Diagnosis:  Principal Problem:   Alcohol withdrawal ([place]) Active Problems:   Alcohol use disorder, severe, dependence ([place])   Total Time spent with patient: 45 minutes  Subjective:   Terri Ayala is a 63 y.o. female patient admitted to the hospital due to alcohol withdrawal.   HPI:  Patient with a past medical history of severe, chronic alcohol use disorder, recurrent major depressive disorder, anxiety and multiple medical illnesses presented to the hospital due to nausea, vomiting and tremors. She reports drinking 1.5 bottles of wine per day for several years and recently decreased  her intake in the past month to 1-2 glasses of wine per day due to a desire to quit, recurrent nausea/vomiting. Patient was not sure when she took her last drink before she came to the hospital but says most likely 10 am yesterday, she  denies abusing any other illicit drugs.   Patient reports having multiple inpatient psychiatric admissions due to her alcohol use with her last admission being 6 months ago at [facility]. She  also reports a history of depression, anxiety and medication treatment at [facility], last visit being in November, 2019. Currently, she denies suicidal/homocidal ideation, delusional thinking or psychosis. Also, patient states that she is not seeking for inpatient psych admission, alcohol rehabilitation but wants a referral to substance abuse counselor upon discharge.  Past Psychiatric History: as above  Risk to Self: Suicidal Ideation: No Suicidal Intent: No Is patient at risk for suicide?: No Suicidal Plan?: No Access to Means: No What has been your use of drugs/alcohol within the last 12 months?: excessive alcohol  use  Triggers for Past Attempts: None known Intentional Self Injurious Behavior: None Risk to Others: Homicidal Ideation: No Thoughts of Harm to Others: No Current Homicidal Intent: No Current Homicidal Plan: No Access to Homicidal Means: No History of harm to others?: No Assessment of Violence: None Noted Does patient have access to weapons?: No Criminal Charges Pending?: No Does patient have a court date: No Prior Inpatient Therapy: Prior Inpatient Therapy: Yes Prior Therapy Dates: 2017, 2016 Prior Therapy Facilty/Provider(s): [facility], [facility] Reason for Treatment: MDD, ALCOHOL ABUSE  Prior Outpatient Therapy: Prior Outpatient Therapy: Yes Prior Therapy Dates: 2019 Prior Therapy Facilty/Provider(s): [facility]  Reason for Treatment: ALCOHOL ABUSE Does patient have an ACCT team?: No Does patient have Intensive In-House Services?  : No Does patient have [facility] services? : No Does patient have P4CC services?: No  Past Medical History:  Past Medical History:  Diagnosis Date  . Alcohol withdrawal ([place]) 11/2017; 01/04/2018  . Alcoholism ([place])   . Anxiety   . Arthritis    "hands" (01/04/2018)  . ASYMPTOMATIC POSTMENOPAUSAL STATUS 08/17/2009  . Chronic lower back pain   . Depression   . DYSPHAGIA 06/19/2007  . Fatty liver, alcoholic   . GERD (gastroesophageal reflux disease)   . GOITER, MULTINODULAR 08/17/2009  . Hyperlipidemia   . Migraines    "not sure what triggers them; I'll have 1 q couple weeks or more; come 2 days in a row when they come" (01/04/2018)  . Mitral valve prolapse   . OSTEOPOROSIS 08/17/2009  . PONV (postoperative nausea and vomiting)   . Supraventricular tachycardia [facility])     Past Surgical History:  Procedure Laterality Date  .  BREAST BIOPSY Right    "benign"  . CATARACT EXTRACTION W/ INTRAOCULAR LENS  IMPLANT, BILATERAL    . CERVICAL CONE BIOPSY  2000s  . CERVIX LESION DESTRUCTION  1991   "dysplasia; lesions"  . CHOLECYSTECTOMY N/A 03/14/2013    Procedure: LAPAROSCOPIC CHOLECYSTECTOMY WITH ATTEMPTED INTRAOPERATIVE CHOLANGIOGRAM;  Surgeon: Harl Bowie, MD;  Location: [facility];  Service: General;  Laterality: N/A;  . laporoscopic abdominal surgery     "for endometriosis"   Family History:  Family History  Problem Relation Age of Onset  . Colon cancer Father        Deceased, 52  . Osteoporosis Mother        Living, 63  . Healthy Brother   . Thyroid disease Neg Hx   . Goiter Neg Hx    Family Psychiatric  History:   Social History:  Social History   Substance and Sexual Activity  Alcohol Use Yes  . Alcohol/week: 63.0 standard drinks  . Types: 63 Glasses of wine per week   Comment: 01/04/2018 "1 1/2 bottles of wine/day"     Social History   Substance and Sexual Activity  Drug Use Never    Social History   Socioeconomic History  . Marital status: Single    Spouse name: Not on file  . Number of children: Not on file  . Years of education: Not on file  . Highest education level: Not on file  Occupational History  . Occupation: Brewing technologist - retired    Fish farm manager: [employer]  Social Needs  . Financial resource strain: Not on file  . Food insecurity:    Worry: Not on file    Inability: Not on file  . Transportation needs:    Medical: Not on file    Non-medical: Not on file  Tobacco Use  . Smoking status: Former Research scientist (life sciences)  . Smokeless tobacco: Never Used  . Tobacco comment: 01/04/2018 "smoked when I was a teenager"  Substance and Sexual Activity  . Alcohol use: Yes    Alcohol/week: 63.0 standard drinks    Types: 63 Glasses of wine per week    Comment: 01/04/2018 "1 1/2 bottles of wine/day"  . Drug use: Never  . Sexual activity: Not Currently  Lifestyle  . Physical activity:    Days per week: Not on file    Minutes per session: Not on file  . Stress: Not on file  Relationships  . Social connections:    Talks on phone: Not on file    Gets together: Not on file    Attends religious service: Not on  file    Active member of club or organization: Not on file    Attends meetings of clubs or organizations: Not on file    Relationship status: Not on file  Other Topics Concern  . Not on file  Social History Narrative   Lives alone.  She has a BS biology.   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   Additional Social History:    Allergies:   Allergies  Allergen Reactions  . Doxycycline Swelling    Mouth swelling and sores  . Codeine Itching  . Tetracycline Hcl Swelling    Mouth swelling and sores    Labs:  Results for orders placed or performed during the hospital encounter of 05/04/18 (from the past 48 hour(s))  Basic metabolic panel     Status: Abnormal   Collection Time: 05/04/18  5:35 PM  Result Value  Ref Range   Sodium 133 (L) 135 - 145 mmol/L   Potassium 4.2 3.5 - 5.1 mmol/L   Chloride 85 (L) 98 - 111 mmol/L   CO2 27 22 - 32 mmol/L   Glucose, Bld 108 (H) 70 - 99 mg/dL   BUN 21 8 - 23 mg/dL   Creatinine, Ser 1.34 (H) 0.44 - 1.00 mg/dL   Calcium 10.9 (H) 8.9 - 10.3 mg/dL   GFR calc non Af Amer 42 (L) >60 mL/min   GFR calc Af Amer 49 (L) >60 mL/min   Anion gap 21 (H) 5 - 15    Comment: Performed at [facility] [address], [place], [place] [address]  CBC     Status: Abnormal   Collection Time: 05/04/18  5:35 PM  Result Value Ref Range   WBC 7.3 4.0 - 10.5 K/uL   RBC 4.12 3.87 - 5.11 MIL/uL   Hemoglobin 13.3 12.0 - 15.0 g/dL   HCT 38.9 36.0 - 46.0 %   MCV 94.4 80.0 - 100.0 fL   MCH 32.3 26.0 - 34.0 pg   MCHC 34.2 30.0 - 36.0 g/dL   RDW 11.4 (L) 11.5 - 15.5 %   Platelets 241 150 - 400 K/uL   nRBC 0.0 0.0 - 0.2 %    Comment: Performed at [facility], [place] [address], [place], [place] [address]  D-dimer, quantitative (not at [facility])     Status: Abnormal   Collection Time: 05/04/18  5:35 PM  Result Value Ref Range   D-Dimer, Quant 1.25 (H) 0.00 - 0.50 ug/mL-FEU    Comment: (NOTE) At the manufacturer cut-off of 0.50  ug/mL FEU, this assay has been documented to exclude PE with a sensitivity and negative predictive value of 97 to 99%.  At this time, this assay has not been approved by the FDA to exclude DVT/VTE. Results should be correlated with clinical presentation. Performed at [facility], [place] [address], [place], [place] [address]   I-stat troponin, ED     Status: None   Collection Time: 05/04/18  5:49 PM  Result Value Ref Range   Troponin i, poc 0.01 0.00 - 0.08 ng/mL   Comment 3            Comment: Due to the release kinetics of cTnI, a negative result within the first hours of the onset of symptoms does not rule out myocardial infarction with certainty. If myocardial infarction is still suspected, repeat the test at appropriate intervals.   Ethanol     Status: None   Collection Time: 05/05/18  1:06 AM  Result Value Ref Range   Alcohol, Ethyl (B) <10 <10 mg/dL    Comment: (NOTE) Lowest detectable limit for serum alcohol is 10 mg/dL. For medical purposes only. Performed at [facility], [place] [address], [place],  [address]   I-Stat Troponin, ED (not at [facility])     Status: None   Collection Time: 05/05/18  1:08 AM  Result Value Ref Range   Troponin i, poc 0.01 0.00 - 0.08 ng/mL   Comment 3            Comment: Due to the release kinetics of cTnI, a negative result within the first hours of the onset of symptoms does not rule out myocardial infarction with certainty. If myocardial infarction is still suspected, repeat the test at appropriate intervals.   Magnesium     Status: Abnormal   Collection Time: 05/05/18  3:40 AM  Result Value Ref Range   Magnesium 1.5 (L) 1.7 - 2.4 mg/dL    Comment: Performed at [facility] [address], [place], [place] [address]  Phosphorus     Status: None   Collection Time: 05/05/18  3:40 AM  Result Value Ref Range   Phosphorus 3.4 2.5 - 4.6 mg/dL    Comment: Performed at [facility] [address]  [place], [place] [address]  Comprehensive metabolic panel     Status: Abnormal   Collection Time: 05/05/18  3:40 AM  Result Value Ref Range   Sodium 134 (L) 135 - 145 mmol/L   Potassium 2.9 (L) 3.5 - 5.1 mmol/L    Comment: NO VISIBLE HEMOLYSIS   Chloride 91 (L) 98 - 111 mmol/L   CO2 25 22 - 32 mmol/L   Glucose, Bld 84 70 - 99 mg/dL   BUN 20 8 - 23 mg/dL   Creatinine, Ser 1.13 (H) 0.44 - 1.00 mg/dL   Calcium 9.2 8.9 - 10.3 mg/dL   Total Protein 5.8 (L) 6.5 - 8.1 g/dL   Albumin 3.1 (L) 3.5 - 5.0 g/dL   AST 89 (H) 15 - 41 U/L   ALT 33 0 - 44 U/L   Alkaline Phosphatase 113 38 - 126 U/L   Total Bilirubin 1.4 (H) 0.3 - 1.2 mg/dL   GFR calc non Af Amer 52 (L) >60 mL/min   GFR calc Af Amer 60 (L) >60 mL/min   Anion gap 18 (H) 5 - 15    Comment: Performed at [facility], [place] [address], [place], [place] [address]  Lipase, blood     Status: Abnormal   Collection Time: 05/05/18  3:40 AM  Result Value Ref Range   Lipase 79 (H) 11 - 51 U/L    Comment: Performed at [facility] [address] [place], [place] [address]    Current Facility-Administered Medications  Medication Dose Route Frequency Provider Last Rate Last Dose  . acetaminophen (TYLENOL) tablet 650 mg  650 mg Oral Q6H PRN Etta Quill, DO       Or  . acetaminophen (TYLENOL) suppository 650 mg  650 mg Rectal Q6H PRN Etta Quill, DO      . aspirin EC tablet 81 mg  81 mg Oral Daily Jennette Kettle M, DO   81 mg at 05/05/18 0809  . busPIRone (BUSPAR) tablet 15 mg  15 mg Oral TID Etta Quill, DO   15 mg at 05/05/18 0809  . DULoxetine (CYMBALTA) DR capsule 60 mg  60 mg Oral BID Jennette Kettle M, DO   60 mg at 05/05/18 0810  . enoxaparin (LOVENOX) injection 40 mg  40 mg Subcutaneous Q24H Jennette Kettle M, DO   40 mg at 05/05/18 1217  . folic acid (FOLVITE) tablet 1 mg  1 mg Oral Daily Jennette Kettle M, DO   1 mg at 05/05/18 0809  . hydrOXYzine (ATARAX/VISTARIL) tablet 25 mg  25 mg Oral BID Jennette Kettle M, DO    25 mg at 05/05/18 0810  . LORazepam (ATIVAN) injection 0-4 mg  0-4 mg Intravenous Q6H McDonald, Mia A, PA-C       Or  . LORazepam (ATIVAN) tablet 0-4 mg  0-4 mg Oral Q6H McDonald, Mia A, PA-C   1 mg at 05/05/18 1217  . [START ON 05/07/2018] LORazepam (ATIVAN) injection 0-4 mg  0-4 mg Intravenous Q12H McDonald, Mia A, PA-C  Or  . [START ON 05/07/2018] LORazepam (ATIVAN) tablet 0-4 mg  0-4 mg Oral Q12H McDonald, Mia A, PA-C      . LORazepam (ATIVAN) tablet 1 mg  1 mg Oral Q6H PRN Etta Quill, DO       Or  . LORazepam (ATIVAN) injection 1 mg  1 mg Intravenous Q6H PRN Etta Quill, DO      . multivitamin with minerals tablet 1 tablet  1 tablet Oral Daily Jennette Kettle M, DO   1 tablet at 05/05/18 0809  . norethindrone-ethinyl estradiol (FEMHRT 1/5) 1-5 MG-MCG per tablet 1 tablet  1 tablet Oral Daily Jennette Kettle M, DO      . ondansetron [facility]) tablet 4 mg  4 mg Oral Q6H PRN Etta Quill, DO       Or  . ondansetron [facility]) injection 4 mg  4 mg Intravenous Q6H PRN Etta Quill, DO      . pantoprazole (PROTONIX) EC tablet 40 mg  40 mg Oral Q1200 Etta Quill, DO   40 mg at 05/05/18 1217  . propranolol (INDERAL) tablet 10 mg  10 mg Oral BID Jennette Kettle M, DO   10 mg at 05/05/18 0810  . sodium chloride flush (NS) 0.9 % injection 3 mL  3 mL Intravenous Once Julianne Rice, MD      . thiamine (VITAMIN B-1) tablet 100 mg  100 mg Oral Daily Jennette Kettle M, DO   100 mg at 05/05/18 9562   Or  . thiamine (B-1) injection 100 mg  100 mg Intravenous Daily Alcario Drought, Jared M, DO      . traZODone (DESYREL) tablet 50 mg  50 mg Oral QHS PRN Etta Quill, DO        Musculoskeletal: Strength & Muscle Tone: decreased Gait & Station: Did not assess.  Patient leans: N/A  Psychiatric Specialty Exam: Physical Exam  Psychiatric: She has a normal mood and affect. Her speech is normal and behavior is normal. Judgment and thought content normal. Cognition and memory are normal.     Review of Systems  Constitutional: Positive for malaise/fatigue.  HENT: Negative.   Eyes: Negative.   Respiratory: Negative.   Cardiovascular: Negative.   Gastrointestinal: Negative.   Genitourinary: Negative.   Musculoskeletal: Negative.   Skin: Negative.   Neurological: Negative.   Endo/Heme/Allergies: Negative.   Psychiatric/Behavioral: Negative.     Blood pressure 98/63, pulse 88, temperature 98.1 F (36.7 C), temperature source Oral, resp. rate 18, height 5\' 1"  (1.549 m), weight 64.1 kg, SpO2 97 %.Body mass index is 26.7 kg/m.  General Appearance: Fairly Groomed  Eye Contact:  Fair  Speech:  Normal Rate  Volume:  Normal  Mood:  Euthymic  Affect:  Appropriate and Congruent  Thought Process:  Coherent and Linear  Orientation:  Full (Time, Place, and Person)  Thought Content:  Logical  Suicidal Thoughts:  No  Homicidal Thoughts:  No  Memory:  Immediate;   Fair Recent;   Fair Remote;   Fair  Judgement:  Fair  Insight:  Fair  Psychomotor Activity:  Decreased  Concentration:  Concentration: Fair and Attention Span: Fair  Recall:  [employer] of Knowledge:  Fair  Language:  Good  Akathisia:  No  Handed:  Right  AIMS (if indicated):     Assets:  Communication Skills Desire for Improvement Financial Resources/Insurance Housing  ADL's:  Intact  Cognition:  WNL  Sleep:   fair     Treatment Plan Summary:  63 y.o. female with alcohol use disorder-severe who was admitted to the hospital due to alcohol withdrawal. Patient is currently calm, cooperative and only requesting assistance  for alcohol detoxification and not seeking inpatient treatment.   Recommendation:  -Continue Ativan Alcohol withdrawal treatment protocol.   -Consider social work consult to assist with referring patient to a substance abuse counselor upon discharge.   Disposition:  No evidence of imminent risk to self or others at present.   Supportive therapy provided about ongoing stressors.   Psychiatric services signing off. Re-consult as needed.   Corena Pilgrim, MD 05/05/2018 3:28 PM

## 2018-05-05 NOTE — ED Notes (Signed)
Report given to joyce rn on 5w

## 2018-05-05 NOTE — Plan of Care (Signed)
Discussed with patient plan of care for the evening, admission questions and routine with some teach back displayed 

## 2018-05-05 NOTE — Progress Notes (Signed)
  Echocardiogram 2D Echocardiogram has been performed.  Jennette Dubin 05/05/2018, 3:02 PM

## 2018-05-06 DIAGNOSIS — F10230 Alcohol dependence with withdrawal, uncomplicated: Secondary | ICD-10-CM | POA: Diagnosis not present

## 2018-05-06 DIAGNOSIS — E876 Hypokalemia: Secondary | ICD-10-CM | POA: Diagnosis not present

## 2018-05-06 LAB — COMPREHENSIVE METABOLIC PANEL
ALT: 27 U/L (ref 0–44)
AST: 82 U/L — AB (ref 15–41)
Albumin: 2.8 g/dL — ABNORMAL LOW (ref 3.5–5.0)
Alkaline Phosphatase: 105 U/L (ref 38–126)
Anion gap: 9 (ref 5–15)
BUN: 15 mg/dL (ref 8–23)
CO2: 26 mmol/L (ref 22–32)
Calcium: 8.6 mg/dL — ABNORMAL LOW (ref 8.9–10.3)
Chloride: 102 mmol/L (ref 98–111)
Creatinine, Ser: 0.92 mg/dL (ref 0.44–1.00)
GFR calc Af Amer: >60 mL/min (ref >60)
GFR calc non Af Amer: >60 mL/min (ref >60)
Glucose, Bld: 89 mg/dL (ref 70–99)
Potassium: 3.5 mmol/L (ref 3.5–5.1)
Sodium: 137 mmol/L (ref 135–145)
Total Bilirubin: 0.9 mg/dL (ref 0.3–1.2)
Total Protein: 5.4 g/dL — ABNORMAL LOW (ref 6.5–8.1)

## 2018-05-06 LAB — MAGNESIUM: Magnesium: 2 mg/dL (ref 1.7–2.4)

## 2018-05-06 MED ORDER — PANTOPRAZOLE SODIUM 40 MG PO TBEC
40.0000 mg | DELAYED_RELEASE_TABLET | Freq: Two times a day (BID) | ORAL | Status: DC
  Administered 2018-05-06 – 2018-05-08 (×4): 40 mg via ORAL
  Filled 2018-05-06 (×4): qty 1

## 2018-05-06 MED ORDER — POTASSIUM CHLORIDE CRYS ER 20 MEQ PO TBCR
40.0000 meq | EXTENDED_RELEASE_TABLET | Freq: Once | ORAL | Status: AC
  Administered 2018-05-06: 40 meq via ORAL
  Filled 2018-05-06: qty 2

## 2018-05-06 NOTE — Progress Notes (Signed)
PROGRESS NOTE        PATIENT DETAILS Name: Terri Ayala Age: 63 y.o. Sex: female Date of Birth: August 12, 1955 Admit Date: 05/04/2018 Admitting Physician Etta Quill, DO EZM:[id number], No Pcp Per  Brief Narrative: Patient is a 62 y.o. female with history of EtOH use, depression, SVT evaluation of chest pain and alcohol withdrawal symptoms.  See below for further details  Subjective: Lying comfortably in bed-not in any distress.  Denies any chest pain or shortness of breath.  Assessment/Plan: Chest pain: Atypical-probably related to alcohol-related gastritis.  CTA chest negative for PE.  Troponins not elevated.  Echocardiogram with preserved EF.  Continue PPI-if chest pain reoccurs-may need cardiology evaluation-but this could be done in the outpatient setting  Nausea/vomiting: Resolved-abdominal exam is completely benign and nontender.  Continue supportive care.    History of SVT: Continue propanolol  Depression: Appears to have a flat affect-continue BuSpar, Cymbalta was being followed by psychiatry team in the ED-have placed a formal inpatient consult for psychiatry.  EtOH use with withdrawal symptoms: Last drink was 3/6 morning-that she is awake-alert-with very minimal tremors.  Continue Ativan per protocol.   Hypokalemia/hypomagnesemia: Likely secondary to alcohol use-this has been repleted.  We will ambulate with physical therapy-if continues to improve-home on 3/9.  DVT Prophylaxis: Prophylactic Lovenox   Code Status: Full code  Family Communication: None at bedside  Disposition Plan: Remain inpatient  Antimicrobial agents: Anti-infectives (From admission, onward)   None      Procedures: None  CONSULTS:  None  Time spent: 25- minutes-Greater than 50% of this time was spent in counseling, explanation of diagnosis, planning of further management, and coordination of care.  MEDICATIONS: Scheduled Meds: . aspirin EC  81 mg Oral  Daily  . busPIRone  15 mg Oral TID  . DULoxetine  60 mg Oral BID  . enoxaparin (LOVENOX) injection  40 mg Subcutaneous Q24H  . folic acid  1 mg Oral Daily  . hydrOXYzine  25 mg Oral BID  . LORazepam  0-4 mg Intravenous Q6H   Or  . LORazepam  0-4 mg Oral Q6H  . [START ON 05/07/2018] LORazepam  0-4 mg Intravenous Q12H   Or  . [START ON 05/07/2018] LORazepam  0-4 mg Oral Q12H  . multivitamin with minerals  1 tablet Oral Daily  . norethindrone-ethinyl estradiol  1 tablet Oral Daily  . pantoprazole  40 mg Oral Q1200  . propranolol  10 mg Oral BID  . sodium chloride flush  3 mL Intravenous Once  . thiamine  100 mg Oral Daily   Or  . thiamine  100 mg Intravenous Daily   Continuous Infusions: PRN Meds:.acetaminophen **OR** acetaminophen, LORazepam **OR** LORazepam, ondansetron **OR** ondansetron (ZOFRAN) IV, traZODone   PHYSICAL EXAM: Vital signs: Vitals:   05/06/18 0003 05/06/18 0603 05/06/18 0954 05/06/18 1214  BP: (!) 90/50 90/61 (!) 88/59 92/61  Pulse: 89 80 87 92  Resp: 16 16    Temp: 97.7 F (36.5 C) 97.7 F (36.5 C) 98.4 F (36.9 C) 98.5 F (36.9 C)  TempSrc: Oral Oral Oral Oral  SpO2: 98% 99% 100% 100%  Weight:      Height:       Filed Weights   05/04/18 0400  Weight: 64.1 kg   Body mass index is 26.7 kg/m.   General appearance:Awake, alert, not in any distress.  Eyes:no  scleral icterus. HEENT: Atraumatic and Normocephalic Neck: supple, no JVD. Resp:Good air entry bilaterally,no rales or rhonchi CVS: S1 S2 regular, no murmurs.  GI: Bowel sounds present, Non tender and not distended with no gaurding, rigidity or rebound. Extremities: B/L Lower Ext shows no edema, both legs are warm to touch Neurology:  Non focal Psychiatric: Normal judgment and insight. Normal mood. Musculoskeletal:No digital cyanosis Skin:No Rash, warm and dry Wounds:N/A  I have personally reviewed following labs and imaging studies  LABORATORY DATA: CBC: Recent Labs  Lab 05/04/18 1735   WBC 7.3  HGB 13.3  HCT 38.9  MCV 94.4  PLT 161    Basic Metabolic Panel: Recent Labs  Lab 05/04/18 1735 05/05/18 0340 05/06/18 0558  NA 133* 134* 137  K 4.2 2.9* 3.5  CL 85* 91* 102  CO2 27 25 26   GLUCOSE 108* 84 89  BUN 21 20 15   CREATININE 1.34* 1.13* 0.92  CALCIUM 10.9* 9.2 8.6*  MG  --  1.5* 2.0  PHOS  --  3.4  --     GFR: Estimated Creatinine Clearance: 53.7 mL/min (by C-G formula based on SCr of 0.92 mg/dL).  Liver Function Tests: Recent Labs  Lab 05/05/18 0340 05/06/18 0558  AST 89* 82*  ALT 33 27  ALKPHOS 113 105  BILITOT 1.4* 0.9  PROT 5.8* 5.4*  ALBUMIN 3.1* 2.8*   Recent Labs  Lab 05/05/18 0340  LIPASE 79*   No results for input(s): AMMONIA in the last 168 hours.  Coagulation Profile: No results for input(s): INR, PROTIME in the last 168 hours.  Cardiac Enzymes: No results for input(s): CKTOTAL, CKMB, CKMBINDEX, TROPONINI in the last 168 hours.  BNP (last 3 results) No results for input(s): PROBNP in the last 8760 hours.  HbA1C: No results for input(s): HGBA1C in the last 72 hours.  CBG: No results for input(s): GLUCAP in the last 168 hours.  Lipid Profile: No results for input(s): CHOL, HDL, LDLCALC, TRIG, CHOLHDL, LDLDIRECT in the last 72 hours.  Thyroid Function Tests: No results for input(s): TSH, T4TOTAL, FREET4, T3FREE, THYROIDAB in the last 72 hours.  Anemia Panel: No results for input(s): VITAMINB12, FOLATE, FERRITIN, TIBC, IRON, RETICCTPCT in the last 72 hours.  Urine analysis:    Component Value Date/Time   COLORURINE YELLOW 01/04/2018 1842   APPEARANCEUR CLEAR 01/04/2018 1842   LABSPEC 1.010 01/04/2018 1842   LABSPEC 1.030 09/01/2017 1443   PHURINE 7.0 01/04/2018 1842   GLUCOSEU NEGATIVE 01/04/2018 1842   HGBUR SMALL (A) 01/04/2018 1842   BILIRUBINUR NEGATIVE 01/04/2018 1842   BILIRUBINUR moderate (A) 09/01/2017 1443   KETONESUR 5 (A) 01/04/2018 1842   PROTEINUR NEGATIVE 01/04/2018 1842   UROBILINOGEN 0.2  07/25/2007 0900   NITRITE NEGATIVE 01/04/2018 1842   LEUKOCYTESUR NEGATIVE 01/04/2018 1842    Sepsis Labs: Lactic Acid, Venous No results found for: LATICACIDVEN  MICROBIOLOGY: No results found for this or any previous visit (from the past 240 hour(s)).  RADIOLOGY STUDIES/RESULTS: Dg Chest 2 View  Result Date: 05/04/2018 CLINICAL DATA:  Chest pain EXAM: CHEST - 2 VIEW COMPARISON:  02/22/2018 FINDINGS: No acute consolidation or effusion. Normal heart size. No pneumothorax. There are small nodular opacities in the right mid lung. There is a healing right eighth rib fracture. IMPRESSION: 1. No acute pulmonary infiltrate. 2. Small nodular opacities in the right mid lung. Consider chest CT for further evaluation. Electronically Signed   By: Donavan Foil M.D.   On: 05/04/2018 20:49   Ct Chest Wo Contrast  Result Date: 05/05/2018 CLINICAL DATA:  63 year old female with dyspnea. EXAM: CT CHEST WITHOUT CONTRAST TECHNIQUE: Multidetector CT imaging of the chest was performed following the standard protocol without IV contrast. COMPARISON:  Same day CXR FINDINGS: Cardiovascular: Normal heart size without pericardial effusion. Coronary arteriosclerosis along the main and LAD. Nonaneurysmal minimally atherosclerotic thoracic aorta. The unenhanced pulmonary arteries unremarkable. Mediastinum/Nodes: No enlarged mediastinal or axillary lymph nodes. Thyroid gland, trachea, and esophagus demonstrate no significant findings. Lungs/Pleura: Streaky scarring and/or atelectasis in the right upper lobe. No dominant mass, effusion or consolidations. Apical pleuroparenchymal scarring is noted bilaterally. Upper Abdomen: Steatosis of liver. Cholecystectomy clips are present. No adrenal mass. Musculoskeletal: Healing right eighth and ninth rib fractures with callus as well as costochondral calcifications on the right are believed to account for the nodular density seen on the earlier same day chest radiograph. IMPRESSION: No  pulmonary nodule identified. The nodular densities seen on earlier same day chest radiograph are felt secondary to costochondral calcifications as well as callus from prior eighth and ninth posterior rib fractures. Mild coronary arteriosclerosis. Hepatic steatosis. Aortic Atherosclerosis (ICD10-I70.0). Electronically Signed   By: Ashley Royalty M.D.   On: 05/05/2018 00:47   Ct Angio Chest Pe W And/or Wo Contrast  Result Date: 05/05/2018 CLINICAL DATA:  PE suspected. Intermediate probability. Positive D-dimer. EXAM: CT ANGIOGRAPHY CHEST WITH CONTRAST TECHNIQUE: Multidetector CT imaging of the chest was performed using the standard protocol during bolus administration of intravenous contrast. Multiplanar CT image reconstructions and MIPs were obtained to evaluate the vascular anatomy. CONTRAST:  110mL ISOVUE-370 IOPAMIDOL (ISOVUE-370) INJECTION 76% COMPARISON:  Noncontrast CT of earlier today. Chest radiograph of 1 day prior. FINDINGS: Cardiovascular: The quality of this exam for evaluation of pulmonary embolism is good. The bolus is well timed. No evidence of pulmonary embolism. Normal caliber of the aorta and branch vessels. Normal heart size, without pericardial effusion. Right coronary artery atherosclerosis. Mediastinum/Nodes: No mediastinal or hilar adenopathy. Tiny hiatal hernia. Lungs/Pleura: No pleural fluid. Subsegmental atelectasis or scarring involves the right middle lobe. There is minimal dependent atelectasis which is new compared to earlier in the morning. Upper Abdomen: Marked hepatic steatosis. Normal imaged portions of the spleen. Musculoskeletal: Remote lower right rib fractures Review of the MIP images confirms the above findings. IMPRESSION: 1. No evidence of pulmonary embolism. 2. Marked hepatic steatosis. 3. Age advanced coronary artery atherosclerosis. Recommend assessment of coronary risk factors and consideration of medical therapy. Electronically Signed   By: Abigail Miyamoto M.D.   On:  05/05/2018 09:48     LOS: 0 days   Oren Binet, MD  [facility]  If 7PM-7AM, please contact night-coverage  Please page via [email or web address]  Go to [email or web address] and use [place]'s universal password to access. If you do not have the password, please contact the hospital operator.  Locate the [facility] provider you are looking for under [facility] and page to a number that you can be directly reached. If you still have difficulty reaching the provider, please page the [facility] (Director on Call) for the Hospitalists listed on amion for assistance.  05/06/2018, 2:05 PM

## 2018-05-07 ENCOUNTER — Ambulatory Visit: Payer: Self-pay | Admitting: Adult Health

## 2018-05-07 ENCOUNTER — Encounter (HOSPITAL_COMMUNITY)
Admission: EM | Disposition: A | Discharge status: Home Health | Payer: Self-pay | Source: Home / Self Care | Attending: Internal Medicine

## 2018-05-07 DIAGNOSIS — E049 Nontoxic goiter, unspecified: Secondary | ICD-10-CM | POA: Diagnosis present

## 2018-05-07 DIAGNOSIS — R1310 Dysphagia, unspecified: Secondary | ICD-10-CM | POA: Diagnosis present

## 2018-05-07 DIAGNOSIS — M810 Age-related osteoporosis without current pathological fracture: Secondary | ICD-10-CM | POA: Diagnosis present

## 2018-05-07 DIAGNOSIS — F10230 Alcohol dependence with withdrawal, uncomplicated: Secondary | ICD-10-CM | POA: Diagnosis not present

## 2018-05-07 DIAGNOSIS — I10 Essential (primary) hypertension: Secondary | ICD-10-CM | POA: Diagnosis present

## 2018-05-07 DIAGNOSIS — I471 Supraventricular tachycardia: Secondary | ICD-10-CM | POA: Diagnosis present

## 2018-05-07 DIAGNOSIS — G8929 Other chronic pain: Secondary | ICD-10-CM | POA: Diagnosis present

## 2018-05-07 DIAGNOSIS — H9190 Unspecified hearing loss, unspecified ear: Secondary | ICD-10-CM | POA: Diagnosis present

## 2018-05-07 DIAGNOSIS — K2920 Alcoholic gastritis without bleeding: Secondary | ICD-10-CM | POA: Diagnosis present

## 2018-05-07 DIAGNOSIS — R079 Chest pain, unspecified: Secondary | ICD-10-CM | POA: Diagnosis not present

## 2018-05-07 DIAGNOSIS — K700 Alcoholic fatty liver: Secondary | ICD-10-CM | POA: Diagnosis present

## 2018-05-07 DIAGNOSIS — I2510 Atherosclerotic heart disease of native coronary artery without angina pectoris: Secondary | ICD-10-CM

## 2018-05-07 DIAGNOSIS — K760 Fatty (change of) liver, not elsewhere classified: Secondary | ICD-10-CM | POA: Diagnosis present

## 2018-05-07 DIAGNOSIS — R0789 Other chest pain: Secondary | ICD-10-CM | POA: Diagnosis not present

## 2018-05-07 DIAGNOSIS — E876 Hypokalemia: Secondary | ICD-10-CM | POA: Diagnosis present

## 2018-05-07 DIAGNOSIS — Z8262 Family history of osteoporosis: Secondary | ICD-10-CM | POA: Diagnosis not present

## 2018-05-07 DIAGNOSIS — F10239 Alcohol dependence with withdrawal, unspecified: Secondary | ICD-10-CM | POA: Diagnosis present

## 2018-05-07 DIAGNOSIS — E871 Hypo-osmolality and hyponatremia: Secondary | ICD-10-CM | POA: Diagnosis present

## 2018-05-07 DIAGNOSIS — F329 Major depressive disorder, single episode, unspecified: Secondary | ICD-10-CM | POA: Diagnosis not present

## 2018-05-07 DIAGNOSIS — Z9049 Acquired absence of other specified parts of digestive tract: Secondary | ICD-10-CM | POA: Diagnosis not present

## 2018-05-07 DIAGNOSIS — F339 Major depressive disorder, recurrent, unspecified: Secondary | ICD-10-CM | POA: Diagnosis present

## 2018-05-07 DIAGNOSIS — E785 Hyperlipidemia, unspecified: Secondary | ICD-10-CM | POA: Diagnosis present

## 2018-05-07 DIAGNOSIS — I341 Nonrheumatic mitral (valve) prolapse: Secondary | ICD-10-CM | POA: Diagnosis present

## 2018-05-07 DIAGNOSIS — Z780 Asymptomatic menopausal state: Secondary | ICD-10-CM | POA: Diagnosis not present

## 2018-05-07 DIAGNOSIS — M5416 Radiculopathy, lumbar region: Secondary | ICD-10-CM | POA: Diagnosis present

## 2018-05-07 DIAGNOSIS — F419 Anxiety disorder, unspecified: Secondary | ICD-10-CM | POA: Diagnosis present

## 2018-05-07 DIAGNOSIS — Z9181 History of falling: Secondary | ICD-10-CM | POA: Diagnosis not present

## 2018-05-07 DIAGNOSIS — K219 Gastro-esophageal reflux disease without esophagitis: Secondary | ICD-10-CM | POA: Diagnosis present

## 2018-05-07 HISTORY — PX: LEFT HEART CATH AND CORONARY ANGIOGRAPHY: CATH118249

## 2018-05-07 LAB — CBC
HCT: 32.9 % — ABNORMAL LOW (ref 36.0–46.0)
HEMOGLOBIN: 10.6 g/dL — AB (ref 12.0–15.0)
MCH: 31.6 pg (ref 26.0–34.0)
MCHC: 32.2 g/dL (ref 30.0–36.0)
MCV: 98.2 fL (ref 80.0–100.0)
Platelets: 239 10*3/uL (ref 150–400)
RBC: 3.35 MIL/uL — ABNORMAL LOW (ref 3.87–5.11)
RDW: 11.8 % (ref 11.5–15.5)
WBC: 6.5 10*3/uL (ref 4.0–10.5)
nRBC: 0 % (ref 0.0–0.2)

## 2018-05-07 LAB — CREATININE, SERUM
Creatinine, Ser: 0.81 mg/dL (ref 0.44–1.00)
GFR calc non Af Amer: >60 mL/min (ref >60)

## 2018-05-07 LAB — GLUCOSE, CAPILLARY
Glucose-Capillary: 106 mg/dL — ABNORMAL HIGH (ref 70–99)
Glucose-Capillary: 89 mg/dL (ref 70–99)

## 2018-05-07 SURGERY — LEFT HEART CATH AND CORONARY ANGIOGRAPHY
Anesthesia: LOCAL

## 2018-05-07 MED ORDER — HEPARIN (PORCINE) IN NACL 1000-0.9 UT/500ML-% IV SOLN
INTRAVENOUS | Status: DC | PRN
  Administered 2018-05-07 (×2): 500 mL

## 2018-05-07 MED ORDER — SODIUM CHLORIDE 0.9 % IV SOLN
INTRAVENOUS | Status: AC
  Administered 2018-05-07: 17:00:00 via INTRAVENOUS

## 2018-05-07 MED ORDER — LIDOCAINE HCL (PF) 1 % IJ SOLN
INTRAMUSCULAR | Status: DC | PRN
  Administered 2018-05-07: 2 mL via SUBCUTANEOUS

## 2018-05-07 MED ORDER — HEPARIN SODIUM (PORCINE) 1000 UNIT/ML IJ SOLN
INTRAMUSCULAR | Status: DC | PRN
  Administered 2018-05-07: 3000 [IU] via INTRAVENOUS

## 2018-05-07 MED ORDER — SODIUM CHLORIDE 0.9% FLUSH: 3.0000 mL | INTRAVENOUS | Status: DC | PRN

## 2018-05-07 MED ORDER — SODIUM CHLORIDE 0.9 % WEIGHT BASED INFUSION: 1.0000 mL/kg/h | INTRAVENOUS | Status: DC

## 2018-05-07 MED ORDER — SODIUM CHLORIDE 0.9 % WEIGHT BASED INFUSION
3.0000 mL/kg/h | INTRAVENOUS | Status: DC
  Administered 2018-05-07: 3 mL/kg/h via INTRAVENOUS

## 2018-05-07 MED ORDER — SODIUM CHLORIDE 0.9% FLUSH
3.0000 mL | Freq: Two times a day (BID) | INTRAVENOUS | Status: DC
  Administered 2018-05-07: 3 mL via INTRAVENOUS

## 2018-05-07 MED ORDER — VERAPAMIL HCL 2.5 MG/ML IV SOLN
INTRAVENOUS | Status: AC
  Filled 2018-05-07: qty 2

## 2018-05-07 MED ORDER — ACETAMINOPHEN 325 MG PO TABS: 650.0000 mg | ORAL_TABLET | ORAL | Status: DC | PRN

## 2018-05-07 MED ORDER — LIDOCAINE HCL (PF) 1 % IJ SOLN
INTRAMUSCULAR | Status: AC
  Filled 2018-05-07: qty 30

## 2018-05-07 MED ORDER — HEPARIN (PORCINE) IN NACL 1000-0.9 UT/500ML-% IV SOLN
INTRAVENOUS | Status: AC
  Filled 2018-05-07: qty 1000

## 2018-05-07 MED ORDER — MIDAZOLAM HCL 2 MG/2ML IJ SOLN
INTRAMUSCULAR | Status: DC | PRN
  Administered 2018-05-07: 1 mg via INTRAVENOUS

## 2018-05-07 MED ORDER — MIDAZOLAM HCL 2 MG/2ML IJ SOLN
INTRAMUSCULAR | Status: AC
  Filled 2018-05-07: qty 2

## 2018-05-07 MED ORDER — ASPIRIN 81 MG PO CHEW: 81.0000 mg | CHEWABLE_TABLET | Freq: Every day | ORAL | Status: DC

## 2018-05-07 MED ORDER — FENTANYL CITRATE (PF) 100 MCG/2ML IJ SOLN
INTRAMUSCULAR | Status: AC
  Filled 2018-05-07: qty 2

## 2018-05-07 MED ORDER — VERAPAMIL HCL 2.5 MG/ML IV SOLN
INTRAVENOUS | Status: DC | PRN
  Administered 2018-05-07: 10 mL via INTRA_ARTERIAL

## 2018-05-07 MED ORDER — HEPARIN SODIUM (PORCINE) 5000 UNIT/ML IJ SOLN: 5000.0000 [IU] | Freq: Three times a day (TID) | INTRAMUSCULAR | Status: DC

## 2018-05-07 MED ORDER — IOHEXOL 350 MG/ML SOLN
INTRAVENOUS | Status: DC | PRN
  Administered 2018-05-07: 65 mL via INTRA_ARTERIAL

## 2018-05-07 MED ORDER — SODIUM CHLORIDE 0.9 % IV SOLN: 250.0000 mL | INTRAVENOUS | Status: DC | PRN

## 2018-05-07 MED ORDER — FENTANYL CITRATE (PF) 100 MCG/2ML IJ SOLN
INTRAMUSCULAR | Status: DC | PRN
  Administered 2018-05-07: 25 ug via INTRAVENOUS

## 2018-05-07 MED ORDER — ONDANSETRON HCL 4 MG/2ML IJ SOLN: 4.0000 mg | Freq: Four times a day (QID) | INTRAMUSCULAR | Status: DC | PRN

## 2018-05-07 SURGICAL SUPPLY — 9 items

## 2018-05-07 NOTE — Progress Notes (Signed)
Pt transferred to [facility] after cath procedure. Report given to nurse on [place]. Pt belongings taken to Pt's new room.

## 2018-05-07 NOTE — H&P (View-Only) (Signed)
Cardiology Consultation:   Patient ID: Terri Ayala MRN: [id number]; DOB: 1955/06/24  Admit date: 05/04/2018 Date of Consult: 05/07/2018  Primary Care Provider: Patient, No Pcp Per Primary Cardiologist: No primary care provider on file.  Primary Electrophysiologist:  None    Patient Profile:   Terri Ayala is a 63 y.o. female with a hx of ETOH abuse, Depression, GERD, HL, Osteroporosis, MVP, and SVT who is being seen today for the evaluation of chest pain at the request of Dr. Sloan Leiter.  History of Present Illness:   Terri Ayala is a 63 yo female with PMH of ETOH abuse, Depression, GERD, HL, Osteroporosis, MVP, and SVT. She was seen by cardiology back in 2012 with SVT and placed on Cardizem. Normal LV function at that time. Did undergo stress testing in 2013 that was normal. Seen back in the office 11/13 and was referred to EP as her symptoms had worsened. Seen by Dr. Lovena Le who offered ablation but noted that her symptoms may not resolve. She declined further procedures and was not seen in follow up.   She presented this admission with several weeks of n/v, shortness of breath and chest pain. Reported she typically drinks about 1 to 1 1/2 bottles of wine a day and had been cutting back. Admitted for detox. During admission reported ongoing chest pain. CTA was negative for PE, but noted calcification in the RCA. Initial plan was to have her follow up as an outpatient. Echo with normal EF, no WMA. She continued to report episodes of chest pain with exertion, such as simple ambulation to the bathroom. Cardiology called to evaluate. In talking with patient, she does report shortness of breath with minimal activity along with chest discomfort. Although today she walked in the hallway with a walker and did fairly well without chest pain. Denies any family hx of CAD, and no smoking history.   Past Medical History:  Diagnosis Date  . Alcohol withdrawal ([place]) 11/2017; 01/04/2018  . Alcoholism ([place])   .  Anxiety   . Arthritis    "hands" (01/04/2018)  . ASYMPTOMATIC POSTMENOPAUSAL STATUS 08/17/2009  . Chronic lower back pain   . Depression   . DYSPHAGIA 06/19/2007  . Fatty liver, alcoholic   . GERD (gastroesophageal reflux disease)   . GOITER, MULTINODULAR 08/17/2009  . Hyperlipidemia   . Migraines    "not sure what triggers them; I'll have 1 q couple weeks or more; come 2 days in a row when they come" (01/04/2018)  . Mitral valve prolapse   . OSTEOPOROSIS 08/17/2009  . PONV (postoperative nausea and vomiting)   . Supraventricular tachycardia [facility])     Past Surgical History:  Procedure Laterality Date  . BREAST BIOPSY Right    "benign"  . CATARACT EXTRACTION W/ INTRAOCULAR LENS  IMPLANT, BILATERAL    . CERVICAL CONE BIOPSY  2000s  . CERVIX LESION DESTRUCTION  1991   "dysplasia; lesions"  . CHOLECYSTECTOMY N/A 03/14/2013   Procedure: LAPAROSCOPIC CHOLECYSTECTOMY WITH ATTEMPTED INTRAOPERATIVE CHOLANGIOGRAM;  Surgeon: Harl Bowie, MD;  Location: [place];  Service: General;  Laterality: N/A;  . laporoscopic abdominal surgery     "for endometriosis"     Home Medications:  Prior to Admission medications   Medication Sig Start Date End Date Taking? Authorizing Provider  alendronate (FOSAMAX) 70 MG tablet Take 70 mg by mouth every Monday. Take with a full glass of water on an empty stomach.    Yes [provider]  aspirin EC 81 MG tablet  Take 81 mg by mouth daily.   Yes [provider]  busPIRone (BUSPAR) 15 MG tablet Take 15 mg by mouth 3 (three) times daily.   Yes [provider]  diclofenac sodium (VOLTAREN) 1 % GEL Apply 2 g topically 4 (four) times daily. Patient taking differently: Apply 2 g topically 4 (four) times daily as needed (pain).  12/19/17  Yes Yu, Amy V, PA-C  DULoxetine (CYMBALTA) 60 MG capsule TAKE 1 CAPSULE (60 MG TOTAL) BY MOUTH DAILY. Patient taking differently: Take 60 mg by mouth 2 (two) times daily.  10/23/14  Yes Denita Lung, MD    hydrOXYzine (ATARAX/VISTARIL) 25 MG tablet Take 25 mg by mouth 2 (two) times daily.    Yes [provider]  lidocaine (LIDODERM) 5 % Place 1 patch onto the skin daily. Remove & Discard patch within 12 hours or as directed by MD Patient taking differently: Place 1 patch onto the skin daily as needed (lower back pain). Remove & Discard patch within 12 hours or as directed by MD 12/19/17  Yes Yu, Amy V, PA-C  Multiple Vitamin (MULTIVITAMIN WITH MINERALS) TABS tablet Take 1 tablet by mouth daily. 01/10/18  Yes Mikhail, [place], DO  norethindrone-ethinyl estradiol (FEMHRT 1/5) 1-5 MG-MCG TABS Take 1 tablet by mouth daily. For Osteoporosis 04/07/14  Yes Lindell Spar I, NP  ondansetron (ZOFRAN ODT) 4 MG disintegrating tablet Take 1 tablet (4 mg total) by mouth every 8 (eight) hours as needed for nausea or vomiting. 09/01/17  Yes Henson, Vickie L, NP-C  pantoprazole (PROTONIX) 40 MG tablet Take 1 tablet (40 mg total) by mouth daily at 12 noon. Patient taking differently: Take 40 mg by mouth daily as needed (heartburn).  11/09/17  Yes Ghimire, Henreitta Leber, MD  propranolol (INDERAL) 10 MG tablet Take 10 mg by mouth 2 (two) times daily.    Yes [provider]  SUMAtriptan (IMITREX) 100 MG tablet Take 1 tablet at the first sign of migraine.  May repeat in 2 hours. Patient taking differently: Take 100 mg by mouth every 2 (two) hours as needed for migraine.  07/31/17  Yes Denita Lung, MD  thiamine 100 MG tablet Take 1 tablet (100 mg total) by mouth daily. 11/09/17  Yes Ghimire, Henreitta Leber, MD  traZODone (DESYREL) 50 MG tablet Take 50 mg by mouth at bedtime as needed for sleep.    Yes [provider]    Inpatient Medications: Scheduled Meds: . aspirin EC  81 mg Oral Daily  . busPIRone  15 mg Oral TID  . DULoxetine  60 mg Oral BID  . enoxaparin (LOVENOX) injection  40 mg Subcutaneous Q24H  . folic acid  1 mg Oral Daily  . hydrOXYzine  25 mg Oral BID  . LORazepam  0-4 mg Intravenous Q12H    Or  . LORazepam  0-4 mg Oral Q12H  . multivitamin with minerals  1 tablet Oral Daily  . norethindrone-ethinyl estradiol  1 tablet Oral Daily  . pantoprazole  40 mg Oral BID  . propranolol  10 mg Oral BID  . thiamine  100 mg Oral Daily   Or  . thiamine  100 mg Intravenous Daily   Continuous Infusions:  PRN Meds: acetaminophen **OR** acetaminophen, LORazepam **OR** LORazepam, ondansetron **OR** ondansetron (ZOFRAN) IV, traZODone  Allergies:    Allergies  Allergen Reactions  . Doxycycline Swelling    Mouth swelling and sores  . Codeine Itching  . Tetracycline Hcl Swelling    Mouth swelling and sores  Social History:   Social History   Socioeconomic History  . Marital status: Single    Spouse name: Not on file  . Number of children: Not on file  . Years of education: Not on file  . Highest education level: Not on file  Occupational History  . Occupation: Brewing technologist - retired    Fish farm manager: [employer]  Social Needs  . Financial resource strain: Not on file  . Food insecurity:    Worry: Not on file    Inability: Not on file  . Transportation needs:    Medical: Not on file    Non-medical: Not on file  Tobacco Use  . Smoking status: Former Research scientist (life sciences)  . Smokeless tobacco: Never Used  . Tobacco comment: 01/04/2018 "smoked when I was a teenager"  Substance and Sexual Activity  . Alcohol use: Yes    Alcohol/week: 63.0 standard drinks    Types: 63 Glasses of wine per week    Comment: 01/04/2018 "1 1/2 bottles of wine/day"  . Drug use: Never  . Sexual activity: Not Currently  Lifestyle  . Physical activity:    Days per week: Not on file    Minutes per session: Not on file  . Stress: Not on file  Relationships  . Social connections:    Talks on phone: Not on file    Gets together: Not on file    Attends religious service: Not on file    Active member of club or organization: Not on file    Attends meetings of clubs or organizations: Not on file     Relationship status: Not on file  . Intimate partner violence:    Fear of current or ex partner: Not on file    Emotionally abused: Not on file    Physically abused: Not on file    Forced sexual activity: Not on file  Other Topics Concern  . Not on file  Social History Narrative   Lives alone.  She has a BS biology.   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    Family History:    Family History  Problem Relation Age of Onset  . Colon cancer Father        Deceased, 73  . Osteoporosis Mother        Living, 46  . Healthy Brother   . Thyroid disease Neg Hx   . Goiter Neg Hx      ROS:  Please see the history of present illness.   All other ROS reviewed and negative.     Physical Exam/Data:   Vitals:   05/06/18 2126 05/07/18 0052 05/07/18 0407 05/07/18 0952  BP: (!) 101/54 (!) 85/54 102/67 92/68  Pulse: 86 90 (!) 103 84  Resp: 19 18 18    Temp: 97.9 F (36.6 C) 98 F (36.7 C) 97.6 F (36.4 C) 97.8 F (36.6 C)  TempSrc: Oral Oral Oral Oral  SpO2: 100% 99% 100% 100%  Weight:      Height:        Intake/Output Summary (Last 24 hours) at 05/07/2018 1032 Last data filed at 05/07/2018 0928 Gross per 24 hour  Intake -  Output 300 ml  Net -300 ml   Last 3 Weights 05/04/2018 02/22/2018 01/30/2018  Weight (lbs) 141 lb 5 oz 140 lb 149 lb 14.6 oz  Weight (kg) 64.1 kg 63.504 kg 68 kg  Some encounter information is confidential and restricted. Go to Review Flowsheets activity to see all  data.     Body mass index is 26.7 kg/m.  General:  Well nourished, well developed, in no acute distress HEENT: normal Neck: no JVD Vascular: + carotid bruits; FA pulses 2+ bilaterally without bruits  Cardiac:  normal S1, S2; RRR; soft systolic murmur  Lungs:  clear to auscultation bilaterally, no wheezing, rhonchi or rales  Abd: soft, nontender  Ext: no edema Musculoskeletal:  No deformities, BUE and BLE strength normal and equal Skin: warm and dry  Neuro:  CNs  2-12 intact, no focal abnormalities noted Psych:  Normal affect   EKG:  The EKG was personally reviewed and demonstrates:  SR  Relevant CV Studies:  TTE: 05/05/2018  IMPRESSIONS    1. The left ventricle has normal systolic function with an ejection fraction of 60-65%. The cavity size was normal. There is mildly increased left ventricular wall thickness. Left ventricular diastolic Doppler parameters are indeterminate. No evidence  of left ventricular regional wall motion abnormalities.  2. The right ventricle has normal systolic function. The cavity was normal. There is no increase in right ventricular wall thickness. Right ventricular systolic pressure normal with an estimated pressure of 19.6 mmHg.  3. The aortic valve is tricuspid Mild aortic annular calcification noted.  4. The mitral valve is normal in structure.  5. The tricuspid valve is normal in structure.  6. The aortic root is normal in size and structure.  Laboratory Data:  Chemistry Recent Labs  Lab 05/04/18 1735 05/05/18 0340 05/06/18 0558  NA 133* 134* 137  K 4.2 2.9* 3.5  CL 85* 91* 102  CO2 27 25 26   GLUCOSE 108* 84 89  BUN 21 20 15   CREATININE 1.34* 1.13* 0.92  CALCIUM 10.9* 9.2 8.6*  GFRNONAA 42* 52* >60  GFRAA 49* 60* >60  ANIONGAP 21* 18* 9    Recent Labs  Lab 05/05/18 0340 05/06/18 0558  PROT 5.8* 5.4*  ALBUMIN 3.1* 2.8*  AST 89* 82*  ALT 33 27  ALKPHOS 113 105  BILITOT 1.4* 0.9   Hematology Recent Labs  Lab 05/04/18 1735  WBC 7.3  RBC 4.12  HGB 13.3  HCT 38.9  MCV 94.4  MCH 32.3  MCHC 34.2  RDW 11.4*  PLT 241   Cardiac EnzymesNo results for input(s): TROPONINI in the last 168 hours.  Recent Labs  Lab 05/04/18 1749 05/05/18 0108  TROPIPOC 0.01 0.01    BNPNo results for input(s): BNP, PROBNP in the last 168 hours.  DDimer  Recent Labs  Lab 05/04/18 1735  DDIMER 1.25*    Radiology/Studies:  Dg Chest 2 View  Result Date: 05/04/2018 CLINICAL DATA:  Chest pain EXAM:  CHEST - 2 VIEW COMPARISON:  02/22/2018 FINDINGS: No acute consolidation or effusion. Normal heart size. No pneumothorax. There are small nodular opacities in the right mid lung. There is a healing right eighth rib fracture. IMPRESSION: 1. No acute pulmonary infiltrate. 2. Small nodular opacities in the right mid lung. Consider chest CT for further evaluation. Electronically Signed   By: Donavan Foil M.D.   On: 05/04/2018 20:49   Ct Chest Wo Contrast  Result Date: 05/05/2018 CLINICAL DATA:  63 year old female with dyspnea. EXAM: CT CHEST WITHOUT CONTRAST TECHNIQUE: Multidetector CT imaging of the chest was performed following the standard protocol without IV contrast. COMPARISON:  Same day CXR FINDINGS: Cardiovascular: Normal heart size without pericardial effusion. Coronary arteriosclerosis along the main and LAD. Nonaneurysmal minimally atherosclerotic thoracic aorta. The unenhanced pulmonary arteries unremarkable. Mediastinum/Nodes: No enlarged mediastinal or axillary lymph  nodes. Thyroid gland, trachea, and esophagus demonstrate no significant findings. Lungs/Pleura: Streaky scarring and/or atelectasis in the right upper lobe. No dominant mass, effusion or consolidations. Apical pleuroparenchymal scarring is noted bilaterally. Upper Abdomen: Steatosis of liver. Cholecystectomy clips are present. No adrenal mass. Musculoskeletal: Healing right eighth and ninth rib fractures with callus as well as costochondral calcifications on the right are believed to account for the nodular density seen on the earlier same day chest radiograph. IMPRESSION: No pulmonary nodule identified. The nodular densities seen on earlier same day chest radiograph are felt secondary to costochondral calcifications as well as callus from prior eighth and ninth posterior rib fractures. Mild coronary arteriosclerosis. Hepatic steatosis. Aortic Atherosclerosis (ICD10-I70.0). Electronically Signed   By: Ashley Royalty M.D.   On: 05/05/2018 00:47    Ct Angio Chest Pe W And/or Wo Contrast  Result Date: 05/05/2018 CLINICAL DATA:  PE suspected. Intermediate probability. Positive D-dimer. EXAM: CT ANGIOGRAPHY CHEST WITH CONTRAST TECHNIQUE: Multidetector CT imaging of the chest was performed using the standard protocol during bolus administration of intravenous contrast. Multiplanar CT image reconstructions and MIPs were obtained to evaluate the vascular anatomy. CONTRAST:  131mL ISOVUE-370 IOPAMIDOL (ISOVUE-370) INJECTION 76% COMPARISON:  Noncontrast CT of earlier today. Chest radiograph of 1 day prior. FINDINGS: Cardiovascular: The quality of this exam for evaluation of pulmonary embolism is good. The bolus is well timed. No evidence of pulmonary embolism. Normal caliber of the aorta and branch vessels. Normal heart size, without pericardial effusion. Right coronary artery atherosclerosis. Mediastinum/Nodes: No mediastinal or hilar adenopathy. Tiny hiatal hernia. Lungs/Pleura: No pleural fluid. Subsegmental atelectasis or scarring involves the right middle lobe. There is minimal dependent atelectasis which is new compared to earlier in the morning. Upper Abdomen: Marked hepatic steatosis. Normal imaged portions of the spleen. Musculoskeletal: Remote lower right rib fractures Review of the MIP images confirms the above findings. IMPRESSION: 1. No evidence of pulmonary embolism. 2. Marked hepatic steatosis. 3. Age advanced coronary artery atherosclerosis. Recommend assessment of coronary risk factors and consideration of medical therapy. Electronically Signed   By: Abigail Miyamoto M.D.   On: 05/05/2018 09:48    Assessment and Plan:   AMMI HUTT is a 63 y.o. female with a hx of ETOH abuse, Depression, GERD, HL, Osteroporosis, MVP, and SVT who is being seen today for the evaluation of chest pain at the request of Dr. Sloan Leiter.  1. Chest Pain: etiology uncertain at this time. Episodes mostly with exertion while in the hospital. She has ruled out. EKG without  ischemia. CTA did read with calcification in the RCA. Do not think that a stress test would be beneficial as she already has calcification on CT. Discussed with patient and will proceed with cardiac cath today.  -- The patient understands that risks included but are not limited to stroke (1 in 1000), death (1 in 22), kidney failure [usually temporary] (1 in 500), bleeding (1 in 200), allergic reaction [possibly serious] (1 in 200).   2.  N/v: resolved  3. ETOH abuse: per primary  4. Hx of SVT: stable on propanolol.   For questions or updates, please contact [place] Please consult [email or web address] for contact info under   Signed, Reino Bellis, NP  05/07/2018 10:32 AM  Agree with note by Reino Bellis NP-C  We are asked to see Terri Ayala by Dr. Evalee Mutton because of chest pain.  She does have a history of ethanol abuse which she is trying to reduce and may be withdrawing from.  Other problems  include history of SVT in the past.  She is complained of chest pain which is somewhat episodic and atypical.  Her CT chest showed no pulmonary embolism but RCA calcification.  Her exam is benign.  I favor diagnostic coronary angiography to define her anatomy and rule out an ischemic etiology.  Lorretta Harp, M.D., [place], [facility], [place] [address] [place], [place]  [address]  [phone] 05/07/2018 11:02 AM

## 2018-05-07 NOTE — Interval H&P Note (Signed)
Cath Lab Visit (complete for each Cath Lab visit)  Clinical Evaluation Leading to the Procedure:   ACS: No.  Non-ACS:    Anginal Classification: CCS IV  Anti-ischemic medical therapy: Minimal Therapy (1 class of medications)  Non-Invasive Test Results: No non-invasive testing performed  Prior CABG: No previous CABG      History and Physical Interval Note:  05/07/2018 3:18 PM  Terri Ayala  has presented today for surgery, with the diagnosis of chest pain.  The various methods of treatment have been discussed with the patient and family. After consideration of risks, benefits and other options for treatment, the patient has consented to  Procedure(s): LEFT HEART CATH AND CORONARY ANGIOGRAPHY (N/A) as a surgical intervention.  The patient's history has been reviewed, patient examined, no change in status, stable for surgery.  I have reviewed the patient's chart and labs.  Questions were answered to the patient's satisfaction.     Belva Crome III

## 2018-05-07 NOTE — CV Procedure (Signed)
   Coronary angiography via right radial using real-time vascular ultrasound for access.  Normal coronary arteries.  Coronary arteries, especially the LAD, are very tortuous.  Normal LV function and pressures.  No complications.

## 2018-05-07 NOTE — Evaluation (Signed)
Physical Therapy Evaluation Patient Details Name: Terri Ayala MRN: [id number] DOB: 1956-01-05 Today's Date: 05/07/2018   History of Present Illness  Patient is a 63 y/o female presenting to the ED on 05/05/2018 with primary complaints of alcohol withdrawal. PMH significant of EtOH use, depression, SVT.     Clinical Impression  Patient admitted with the above listed diagnosis. Patient reports Mod I with mobility with RW prior to admission. Patient today ambulating with RW at min guard level for safety. Some difficulty with way finding noted with cueing for scanning of environment. No physical assist needed for mobility. Will recommend HHPT at discharge to ensure safe and independent mobility in the home. PT to continue to follow acutely.     Follow Up Recommendations Home health PT;Supervision - Intermittent;Supervision for mobility/OOB    Equipment Recommendations  None recommended by PT    Recommendations for Other Services       Precautions / Restrictions Precautions Precautions: Fall Restrictions Weight Bearing Restrictions: No      Mobility  Bed Mobility Overal bed mobility: Modified Independent             General bed mobility comments: increased time and effort  Transfers Overall transfer level: Needs assistance Equipment used: Rolling walker (2 wheeled) Transfers: Sit to/from [facility] Sit to Stand: Min guard Stand pivot transfers: Min guard       General transfer comment: patient loosing balance seated EOB while putting on socks. Min guard throughout for safety  Ambulation/Gait Ambulation/Gait assistance: Min guard Gait Distance (Feet): 150 Feet Assistive device: Rolling walker (2 wheeled) Gait Pattern/deviations: Step-to pattern;Step-through pattern;Decreased stride length;Trunk flexed Gait velocity: decreased   General Gait Details: min guard for safety; unsteadiness throughout; difficulty with way-finding requiring multiple cues for  finding room.  Stairs            Wheelchair Mobility    Modified Rankin (Stroke Patients Only)       Balance Overall balance assessment: Needs assistance Sitting-balance support: No upper extremity supported;Feet supported Sitting balance-Leahy Scale: Fair   Postural control: Posterior lean Standing balance support: Bilateral upper extremity supported;During functional activity Standing balance-Leahy Scale: Poor Standing balance comment: reliant on RW at this time                             Pertinent Vitals/Pain Pain Assessment: No/denies pain    Home Living Family/patient expects to be discharged to:: Private residence Living Arrangements: Alone Available Help at Discharge: Friend(s);Available PRN/intermittently Type of Home: House Home Access: Stairs to enter Entrance Stairs-Rails: None Entrance Stairs-Number of Steps: 1 Home Layout: Two level Home Equipment: Walker - 2 wheels;Cane - single point;Bedside commode;Shower seat      Prior Function Level of Independence: Independent with assistive device(s)         Comments: use of RW due to feeling weak and nauseous     Hand Dominance        Extremity/Trunk Assessment   Upper Extremity Assessment Upper Extremity Assessment: Defer to OT evaluation    Lower Extremity Assessment Lower Extremity Assessment: Generalized weakness    Cervical / Trunk Assessment Cervical / Trunk Assessment: Normal  Communication   Communication: HOH  Cognition Arousal/Alertness: Awake/alert Behavior During Therapy: WFL for tasks assessed/performed Overall Cognitive Status: Within Functional Limits for tasks assessed  General Comments General comments (skin integrity, edema, etc.): SpO2 on RA95-98%; HR up to 101bpm with mobility;     Exercises     Assessment/Plan    PT Assessment Patient needs continued PT services  PT Problem List Decreased  strength;Decreased activity tolerance;Decreased balance;Decreased mobility;Decreased knowledge of use of DME;Decreased safety awareness       PT Treatment Interventions DME instruction;Gait training;Stair training;Functional mobility training;Therapeutic activities;Therapeutic exercise;Balance training;Patient/family education    PT Goals (Current goals can be found in the Care Plan section)  Acute Rehab PT Goals Patient Stated Goal: return home today PT Goal Formulation: With patient Time For Goal Achievement: 05/21/18 Potential to Achieve Goals: Good    Frequency Min 3X/week   Barriers to discharge        Co-evaluation               AM-PAC PT "6 Clicks" Mobility  Outcome Measure Help needed turning from your back to your side while in a flat bed without using bedrails?: A Little Help needed moving from lying on your back to sitting on the side of a flat bed without using bedrails?: A Little Help needed moving to and from a bed to a chair (including a wheelchair)?: A Little Help needed standing up from a chair using your arms (e.g., wheelchair or bedside chair)?: A Little Help needed to walk in hospital room?: A Little Help needed climbing 3-5 steps with a railing? : A Little 6 Click Score: 18    End of Session Equipment Utilized During Treatment: Gait belt Activity Tolerance: Patient tolerated treatment well Patient left: in chair;with call bell/phone within reach;with chair alarm set Nurse Communication: Mobility status PT Visit Diagnosis: Unsteadiness on feet (R26.81);Other abnormalities of gait and mobility (R26.89);Muscle weakness (generalized) (M62.81)    Time: 3662-9476 PT Time Calculation (min) (ACUTE ONLY): 18 min   Charges:   PT Evaluation $PT Eval Moderate Complexity: 1 Mod          Lanney Gins, PT, DPT Supplemental Physical Therapist 05/07/18 8:52 AM Pager: [phone] Office: [phone]

## 2018-05-07 NOTE — Progress Notes (Signed)
PROGRESS NOTE        PATIENT DETAILS Name: Terri Ayala Age: 63 y.o. Sex: female Date of Birth: [birth year] Admit Date: 05/04/2018 Admitting Physician Etta Quill, DO [id number], No Pcp Per  Brief Narrative: Patient is a 63 y.o. female with past medical history significant for chronic alcohol use disorder, hyperlipidemia, hypertension, hepatic stenosis, SVT, and  recurrent major depressive disorder who presented to the ED with chief complaint nausea, vomiting, and generalized fatigue in the setting of alcohol withdrawal. She reported that she typically drinks 1 to 1.5 bottles of wine per day and has recently been cutting back to 1-2 glasses daily over the past week. She also complains of chest pressure and dyspnea that is worse with ambulation. Chest-xray with small nodular opacities in right mid lung. CT chest revealed costochondral calcifications secondary to prior 8th and 9th posterior rib fractures. Mild coronary arterial atherosclerosis. CT Angio negative for PE. Patient was admitted for further evaluation and treatment.   Subjective: Patient lying comfortably in bed. Complains of shortness of breath and sharp chest pain with ambulation. Denies shortness of breath or chest pain at rest. No headache, nausea, or vomiting.   Assessment/Plan: Principal Problem:   Alcohol withdrawal ([place]) Active Problems:   Alcohol use disorder, severe, dependence ([place])  1. Chest pain -Patient with episodes of sharp chest pain and shortness of breath with ambulation over the past 2 weeks. Troponins not elevated. Chest x-ray with normal heart size. EKG with sinus tachycardia, otherwise normal. Echo with EF of 60-65%, mild aortic calcification. CT chest with mild coronary arterial atherosclerosis. Due to patient's SOB with minimal activity, cardiology consult placed- plan for cardiac cath today.   2. Nausea/vomiting -Resolved. No abdominal tenderness or distention on exam. Will  continue supportive care, zofran as needed  -Patient to remain NPO pending cardiac cath   3. Alcohol abuse with withdrawal symptoms  -Minimal tremors on exam. BP and HR stable. Will continue Ativan per CIWA protocol   4. Depression -Continue with duloxetine, buspirone. Psychiatry consult placed, evaluated on 3/7  5. History of SVT  -Normal sinus, regular rate maintained with propranolol. Will continue medication management, continuous cardiac monitoring     Telemetry (independently reviewed): sinus rhythm   Echo (reviewed):EF 60-65% on TTE done on 05/05/18  Morning labs/Imaging ordered: yes  DVT Prophylaxis: Prophylactic Lovenox   Code Status: Full code  Family Communication: None  Disposition Plan: Remain inpatient, consider discharge pending results of cardiac cath   Antimicrobial agents: Anti-infectives (From admission, onward)   None      Procedures: Echo  CONSULTS: Psychiatry Cardiology   MEDICATIONS: Scheduled Meds: . aspirin EC  81 mg Oral Daily  . busPIRone  15 mg Oral TID  . DULoxetine  60 mg Oral BID  . enoxaparin (LOVENOX) injection  40 mg Subcutaneous Q24H  . folic acid  1 mg Oral Daily  . hydrOXYzine  25 mg Oral BID  . LORazepam  0-4 mg Intravenous Q12H   Or  . LORazepam  0-4 mg Oral Q12H  . multivitamin with minerals  1 tablet Oral Daily  . norethindrone-ethinyl estradiol  1 tablet Oral Daily  . pantoprazole  40 mg Oral BID  . propranolol  10 mg Oral BID  . thiamine  100 mg Oral Daily   Or  . thiamine  100 mg Intravenous Daily   Continuous  Infusions: PRN Meds:.acetaminophen **OR** acetaminophen, LORazepam **OR** LORazepam, ondansetron **OR** ondansetron (ZOFRAN) IV, traZODone   PHYSICAL EXAM: Vital signs: Vitals:   05/06/18 2126 05/07/18 0052 05/07/18 0407 05/07/18 0952  BP: (!) 101/54 (!) 85/54 102/67 92/68  Pulse: 86 90 (!) 103 84  Resp: 19 18 18    Temp: 97.9 F (36.6 C) 98 F (36.7 C) 97.6 F (36.4 C) 97.8 F (36.6 C)    TempSrc: Oral Oral Oral Oral  SpO2: 100% 99% 100% 100%  Weight:      Height:       Filed Weights   05/04/18 0400  Weight: 64.1 kg   Body mass index is 26.7 kg/m.   General: Awake, alert, not in any distress. Not toxic looking HEENT: Atraumatic. Oral mucosa moist. No scleral icterus  CV: Regular rate and rhythm, S1 S2 regular  Pulm: Good air entry bilaterally  GI: Non tender and not distended with no guarding Extremities:No LE edema bilaterally Neurology: Speech clear, non focal, sensation grossly intact Psychiatric: Normal judgment and insight Musculoskeletal: No visible joint deformities  Skin: No rash  LABORATORY DATA: CBC: Recent Labs  Lab 05/04/18 1735  WBC 7.3  HGB 13.3  HCT 38.9  MCV 94.4  PLT 782    Basic Metabolic Panel: Recent Labs  Lab 05/04/18 1735 05/05/18 0340 05/06/18 0558  NA 133* 134* 137  K 4.2 2.9* 3.5  CL 85* 91* 102  CO2 27 25 26   GLUCOSE 108* 84 89  BUN 21 20 15   CREATININE 1.34* 1.13* 0.92  CALCIUM 10.9* 9.2 8.6*  MG  --  1.5* 2.0  PHOS  --  3.4  --     GFR: Estimated Creatinine Clearance: 53.7 mL/min (by C-G formula based on SCr of 0.92 mg/dL).  Liver Function Tests: Recent Labs  Lab 05/05/18 0340 05/06/18 0558  AST 89* 82*  ALT 33 27  ALKPHOS 113 105  BILITOT 1.4* 0.9  PROT 5.8* 5.4*  ALBUMIN 3.1* 2.8*   Recent Labs  Lab 05/05/18 0340  LIPASE 79*   No results for input(s): AMMONIA in the last 168 hours.  Coagulation Profile: No results for input(s): INR, PROTIME in the last 168 hours.  Cardiac Enzymes: No results for input(s): CKTOTAL, CKMB, CKMBINDEX, TROPONINI in the last 168 hours.  BNP (last 3 results) No results for input(s): PROBNP in the last 8760 hours.  HbA1C: No results for input(s): HGBA1C in the last 72 hours.  CBG: Recent Labs  Lab 05/07/18 0048  GLUCAP 106*    Lipid Profile: No results for input(s): CHOL, HDL, LDLCALC, TRIG, CHOLHDL, LDLDIRECT in the last 72 hours.  Thyroid  Function Tests: No results for input(s): TSH, T4TOTAL, FREET4, T3FREE, THYROIDAB in the last 72 hours.  Anemia Panel: No results for input(s): VITAMINB12, FOLATE, FERRITIN, TIBC, IRON, RETICCTPCT in the last 72 hours.  Urine analysis:    Component Value Date/Time   COLORURINE YELLOW 01/04/2018 1842   APPEARANCEUR CLEAR 01/04/2018 1842   LABSPEC 1.010 01/04/2018 1842   LABSPEC 1.030 09/01/2017 1443   PHURINE 7.0 01/04/2018 1842   GLUCOSEU NEGATIVE 01/04/2018 1842   HGBUR SMALL (A) 01/04/2018 1842   BILIRUBINUR NEGATIVE 01/04/2018 1842   BILIRUBINUR moderate (A) 09/01/2017 1443   KETONESUR 5 (A) 01/04/2018 1842   PROTEINUR NEGATIVE 01/04/2018 1842   UROBILINOGEN 0.2 07/25/2007 0900   NITRITE NEGATIVE 01/04/2018 1842   LEUKOCYTESUR NEGATIVE 01/04/2018 1842    Sepsis Labs: Lactic Acid, Venous No results found for: LATICACIDVEN  MICROBIOLOGY: No results found  for this or any previous visit (from the past 240 hour(s)).  RADIOLOGY STUDIES/RESULTS: Dg Chest 2 View  Result Date: 05/04/2018 CLINICAL DATA:  Chest pain EXAM: CHEST - 2 VIEW COMPARISON:  02/22/2018 FINDINGS: No acute consolidation or effusion. Normal heart size. No pneumothorax. There are small nodular opacities in the right mid lung. There is a healing right eighth rib fracture. IMPRESSION: 1. No acute pulmonary infiltrate. 2. Small nodular opacities in the right mid lung. Consider chest CT for further evaluation. Electronically Signed   By: Donavan Foil M.D.   On: 05/04/2018 20:49   Ct Chest Wo Contrast  Result Date: 05/05/2018 CLINICAL DATA:  63 year old female with dyspnea. EXAM: CT CHEST WITHOUT CONTRAST TECHNIQUE: Multidetector CT imaging of the chest was performed following the standard protocol without IV contrast. COMPARISON:  Same day CXR FINDINGS: Cardiovascular: Normal heart size without pericardial effusion. Coronary arteriosclerosis along the main and LAD. Nonaneurysmal minimally atherosclerotic thoracic aorta.  The unenhanced pulmonary arteries unremarkable. Mediastinum/Nodes: No enlarged mediastinal or axillary lymph nodes. Thyroid gland, trachea, and esophagus demonstrate no significant findings. Lungs/Pleura: Streaky scarring and/or atelectasis in the right upper lobe. No dominant mass, effusion or consolidations. Apical pleuroparenchymal scarring is noted bilaterally. Upper Abdomen: Steatosis of liver. Cholecystectomy clips are present. No adrenal mass. Musculoskeletal: Healing right eighth and ninth rib fractures with callus as well as costochondral calcifications on the right are believed to account for the nodular density seen on the earlier same day chest radiograph. IMPRESSION: No pulmonary nodule identified. The nodular densities seen on earlier same day chest radiograph are felt secondary to costochondral calcifications as well as callus from prior eighth and ninth posterior rib fractures. Mild coronary arteriosclerosis. Hepatic steatosis. Aortic Atherosclerosis (ICD10-I70.0). Electronically Signed   By: Ashley Royalty M.D.   On: 05/05/2018 00:47   Ct Angio Chest Pe W And/or Wo Contrast  Result Date: 05/05/2018 CLINICAL DATA:  PE suspected. Intermediate probability. Positive D-dimer. EXAM: CT ANGIOGRAPHY CHEST WITH CONTRAST TECHNIQUE: Multidetector CT imaging of the chest was performed using the standard protocol during bolus administration of intravenous contrast. Multiplanar CT image reconstructions and MIPs were obtained to evaluate the vascular anatomy. CONTRAST:  175mL ISOVUE-370 IOPAMIDOL (ISOVUE-370) INJECTION 76% COMPARISON:  Noncontrast CT of earlier today. Chest radiograph of 1 day prior. FINDINGS: Cardiovascular: The quality of this exam for evaluation of pulmonary embolism is good. The bolus is well timed. No evidence of pulmonary embolism. Normal caliber of the aorta and branch vessels. Normal heart size, without pericardial effusion. Right coronary artery atherosclerosis. Mediastinum/Nodes: No  mediastinal or hilar adenopathy. Tiny hiatal hernia. Lungs/Pleura: No pleural fluid. Subsegmental atelectasis or scarring involves the right middle lobe. There is minimal dependent atelectasis which is new compared to earlier in the morning. Upper Abdomen: Marked hepatic steatosis. Normal imaged portions of the spleen. Musculoskeletal: Remote lower right rib fractures Review of the MIP images confirms the above findings. IMPRESSION: 1. No evidence of pulmonary embolism. 2. Marked hepatic steatosis. 3. Age advanced coronary artery atherosclerosis. Recommend assessment of coronary risk factors and consideration of medical therapy. Electronically Signed   By: Abigail Miyamoto M.D.   On: 05/05/2018 09:48     LOS: 0 days   Deboraha Sprang, PA-S  05/07/2018, 10:46 AM

## 2018-05-07 NOTE — Progress Notes (Signed)
Claims that upon walking from the bed to the bathroom-she has chest pain and exertional dyspnea.  Apparently this is been going on for 2 weeks.  CTA chest/echocardiogram without major abnormalities.  However coronary calcification seen on CT chest.  Will consult cardiology to see if patient needs a inpatient evaluation prior to discharge.

## 2018-05-07 NOTE — Consult Note (Addendum)
Cardiology Consultation:   Patient ID: ILYSSA GRENNAN MRN: [id number]; DOB: [birth year]  Admit date: 05/04/2018 Date of Consult: 05/07/2018  Primary Care Provider: Patient, No Pcp Per Primary Cardiologist: No primary care provider on file.  Primary Electrophysiologist:  None    Patient Profile:   BRITTIANY WIEHE is a 63 y.o. female with a hx of ETOH abuse, Depression, GERD, HL, Osteroporosis, MVP, and SVT who is being seen today for the evaluation of chest pain at the request of Dr. Sloan Leiter.  History of Present Illness:   Ms. Shiroma is a 63 yo female with PMH of ETOH abuse, Depression, GERD, HL, Osteroporosis, MVP, and SVT. She was seen by cardiology back in 2012 with SVT and placed on Cardizem. Normal LV function at that time. Did undergo stress testing in 2013 that was normal. Seen back in the office 11/13 and was referred to EP as her symptoms had worsened. Seen by Dr. Lovena Le who offered ablation but noted that her symptoms may not resolve. She declined further procedures and was not seen in follow up.   She presented this admission with several weeks of n/v, shortness of breath and chest pain. Reported she typically drinks about 1 to 1 1/2 bottles of wine a day and had been cutting back. Admitted for detox. During admission reported ongoing chest pain. CTA was negative for PE, but noted calcification in the RCA. Initial plan was to have her follow up as an outpatient. Echo with normal EF, no WMA. She continued to report episodes of chest pain with exertion, such as simple ambulation to the bathroom. Cardiology called to evaluate. In talking with patient, she does report shortness of breath with minimal activity along with chest discomfort. Although today she walked in the hallway with a walker and did fairly well without chest pain. Denies any family hx of CAD, and no smoking history.   Past Medical History:  Diagnosis Date  . Alcohol withdrawal ([place]) 11/2017; 01/04/2018  . Alcoholism ([place])   .  Anxiety   . Arthritis    "hands" (01/04/2018)  . ASYMPTOMATIC POSTMENOPAUSAL STATUS 08/17/2009  . Chronic lower back pain   . Depression   . DYSPHAGIA 06/19/2007  . Fatty liver, alcoholic   . GERD (gastroesophageal reflux disease)   . GOITER, MULTINODULAR 08/17/2009  . Hyperlipidemia   . Migraines    "not sure what triggers them; I'll have 1 q couple weeks or more; come 2 days in a row when they come" (01/04/2018)  . Mitral valve prolapse   . OSTEOPOROSIS 08/17/2009  . PONV (postoperative nausea and vomiting)   . Supraventricular tachycardia [facility])     Past Surgical History:  Procedure Laterality Date  . BREAST BIOPSY Right    "benign"  . CATARACT EXTRACTION W/ INTRAOCULAR LENS  IMPLANT, BILATERAL    . CERVICAL CONE BIOPSY  2000s  . CERVIX LESION DESTRUCTION  1991   "dysplasia; lesions"  . CHOLECYSTECTOMY N/A 03/14/2013   Procedure: LAPAROSCOPIC CHOLECYSTECTOMY WITH ATTEMPTED INTRAOPERATIVE CHOLANGIOGRAM;  Surgeon: Harl Bowie, MD;  Location: [facility];  Service: General;  Laterality: N/A;  . laporoscopic abdominal surgery     "for endometriosis"     Home Medications:  Prior to Admission medications   Medication Sig Start Date End Date Taking? Authorizing Provider  alendronate (FOSAMAX) 70 MG tablet Take 70 mg by mouth every Monday. Take with a full glass of water on an empty stomach.    Yes [provider]  aspirin EC 81 MG tablet  Take 81 mg by mouth daily.   Yes [provider]  busPIRone (BUSPAR) 15 MG tablet Take 15 mg by mouth 3 (three) times daily.   Yes [provider]  diclofenac sodium (VOLTAREN) 1 % GEL Apply 2 g topically 4 (four) times daily. Patient taking differently: Apply 2 g topically 4 (four) times daily as needed (pain).  12/19/17  Yes Yu, Amy V, PA-C  DULoxetine (CYMBALTA) 60 MG capsule TAKE 1 CAPSULE (60 MG TOTAL) BY MOUTH DAILY. Patient taking differently: Take 60 mg by mouth 2 (two) times daily.  10/23/14  Yes Denita Lung, MD    hydrOXYzine (ATARAX/VISTARIL) 25 MG tablet Take 25 mg by mouth 2 (two) times daily.    Yes [provider]  lidocaine (LIDODERM) 5 % Place 1 patch onto the skin daily. Remove & Discard patch within 12 hours or as directed by MD Patient taking differently: Place 1 patch onto the skin daily as needed (lower back pain). Remove & Discard patch within 12 hours or as directed by MD 12/19/17  Yes Yu, Amy V, PA-C  Multiple Vitamin (MULTIVITAMIN WITH MINERALS) TABS tablet Take 1 tablet by mouth daily. 01/10/18  Yes Mikhail, [place], DO  norethindrone-ethinyl estradiol (FEMHRT 1/5) 1-5 MG-MCG TABS Take 1 tablet by mouth daily. For Osteoporosis 04/07/14  Yes Lindell Spar I, NP  ondansetron (ZOFRAN ODT) 4 MG disintegrating tablet Take 1 tablet (4 mg total) by mouth every 8 (eight) hours as needed for nausea or vomiting. 09/01/17  Yes Henson, Vickie L, NP-C  pantoprazole (PROTONIX) 40 MG tablet Take 1 tablet (40 mg total) by mouth daily at 12 noon. Patient taking differently: Take 40 mg by mouth daily as needed (heartburn).  11/09/17  Yes Ghimire, Henreitta Leber, MD  propranolol (INDERAL) 10 MG tablet Take 10 mg by mouth 2 (two) times daily.    Yes [provider]  SUMAtriptan (IMITREX) 100 MG tablet Take 1 tablet at the first sign of migraine.  May repeat in 2 hours. Patient taking differently: Take 100 mg by mouth every 2 (two) hours as needed for migraine.  07/31/17  Yes Denita Lung, MD  thiamine 100 MG tablet Take 1 tablet (100 mg total) by mouth daily. 11/09/17  Yes Ghimire, Henreitta Leber, MD  traZODone (DESYREL) 50 MG tablet Take 50 mg by mouth at bedtime as needed for sleep.    Yes [provider]    Inpatient Medications: Scheduled Meds: . aspirin EC  81 mg Oral Daily  . busPIRone  15 mg Oral TID  . DULoxetine  60 mg Oral BID  . enoxaparin (LOVENOX) injection  40 mg Subcutaneous Q24H  . folic acid  1 mg Oral Daily  . hydrOXYzine  25 mg Oral BID  . LORazepam  0-4 mg Intravenous Q12H    Or  . LORazepam  0-4 mg Oral Q12H  . multivitamin with minerals  1 tablet Oral Daily  . norethindrone-ethinyl estradiol  1 tablet Oral Daily  . pantoprazole  40 mg Oral BID  . propranolol  10 mg Oral BID  . thiamine  100 mg Oral Daily   Or  . thiamine  100 mg Intravenous Daily   Continuous Infusions:  PRN Meds: acetaminophen **OR** acetaminophen, LORazepam **OR** LORazepam, ondansetron **OR** ondansetron (ZOFRAN) IV, traZODone  Allergies:    Allergies  Allergen Reactions  . Doxycycline Swelling    Mouth swelling and sores  . Codeine Itching  . Tetracycline Hcl Swelling    Mouth swelling and sores  Social History:   Social History   Socioeconomic History  . Marital status: Single    Spouse name: Not on file  . Number of children: Not on file  . Years of education: Not on file  . Highest education level: Not on file  Occupational History  . Occupation: Brewing technologist - retired    Fish farm manager: [employer]  Social Needs  . Financial resource strain: Not on file  . Food insecurity:    Worry: Not on file    Inability: Not on file  . Transportation needs:    Medical: Not on file    Non-medical: Not on file  Tobacco Use  . Smoking status: Former Research scientist (life sciences)  . Smokeless tobacco: Never Used  . Tobacco comment: 01/04/2018 "smoked when I was a teenager"  Substance and Sexual Activity  . Alcohol use: Yes    Alcohol/week: 63.0 standard drinks    Types: 63 Glasses of wine per week    Comment: 01/04/2018 "1 1/2 bottles of wine/day"  . Drug use: Never  . Sexual activity: Not Currently  Lifestyle  . Physical activity:    Days per week: Not on file    Minutes per session: Not on file  . Stress: Not on file  Relationships  . Social connections:    Talks on phone: Not on file    Gets together: Not on file    Attends religious service: Not on file    Active member of club or organization: Not on file    Attends meetings of clubs or organizations: Not on file     Relationship status: Not on file  . Intimate partner violence:    Fear of current or ex partner: Not on file    Emotionally abused: Not on file    Physically abused: Not on file    Forced sexual activity: Not on file  Other Topics Concern  . Not on file  Social History Narrative   Lives alone.  She has a BS biology.   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    Family History:    Family History  Problem Relation Age of Onset  . Colon cancer Father        Deceased, 49  . Osteoporosis Mother        Living, 26  . Healthy Brother   . Thyroid disease Neg Hx   . Goiter Neg Hx      ROS:  Please see the history of present illness.   All other ROS reviewed and negative.     Physical Exam/Data:   Vitals:   05/06/18 2126 05/07/18 0052 05/07/18 0407 05/07/18 0952  BP: (!) 101/54 (!) 85/54 102/67 92/68  Pulse: 86 90 (!) 103 84  Resp: 19 18 18    Temp: 97.9 F (36.6 C) 98 F (36.7 C) 97.6 F (36.4 C) 97.8 F (36.6 C)  TempSrc: Oral Oral Oral Oral  SpO2: 100% 99% 100% 100%  Weight:      Height:        Intake/Output Summary (Last 24 hours) at 05/07/2018 1032 Last data filed at 05/07/2018 0928 Gross per 24 hour  Intake -  Output 300 ml  Net -300 ml   Last 3 Weights 05/04/2018 02/22/2018 01/30/2018  Weight (lbs) 141 lb 5 oz 140 lb 149 lb 14.6 oz  Weight (kg) 64.1 kg 63.504 kg 68 kg  Some encounter information is confidential and restricted. Go to Review Flowsheets activity to see all  data.     Body mass index is 26.7 kg/m.  General:  Well nourished, well developed, in no acute distress HEENT: normal Neck: no JVD Vascular: + carotid bruits; FA pulses 2+ bilaterally without bruits  Cardiac:  normal S1, S2; RRR; soft systolic murmur  Lungs:  clear to auscultation bilaterally, no wheezing, rhonchi or rales  Abd: soft, nontender  Ext: no edema Musculoskeletal:  No deformities, BUE and BLE strength normal and equal Skin: warm and dry  Neuro:  CNs  2-12 intact, no focal abnormalities noted Psych:  Normal affect   EKG:  The EKG was personally reviewed and demonstrates:  SR  Relevant CV Studies:  TTE: 05/05/2018  IMPRESSIONS    1. The left ventricle has normal systolic function with an ejection fraction of 60-65%. The cavity size was normal. There is mildly increased left ventricular wall thickness. Left ventricular diastolic Doppler parameters are indeterminate. No evidence  of left ventricular regional wall motion abnormalities.  2. The right ventricle has normal systolic function. The cavity was normal. There is no increase in right ventricular wall thickness. Right ventricular systolic pressure normal with an estimated pressure of 19.6 mmHg.  3. The aortic valve is tricuspid Mild aortic annular calcification noted.  4. The mitral valve is normal in structure.  5. The tricuspid valve is normal in structure.  6. The aortic root is normal in size and structure.  Laboratory Data:  Chemistry Recent Labs  Lab 05/04/18 1735 05/05/18 0340 05/06/18 0558  NA 133* 134* 137  K 4.2 2.9* 3.5  CL 85* 91* 102  CO2 27 25 26   GLUCOSE 108* 84 89  BUN 21 20 15   CREATININE 1.34* 1.13* 0.92  CALCIUM 10.9* 9.2 8.6*  GFRNONAA 42* 52* >60  GFRAA 49* 60* >60  ANIONGAP 21* 18* 9    Recent Labs  Lab 05/05/18 0340 05/06/18 0558  PROT 5.8* 5.4*  ALBUMIN 3.1* 2.8*  AST 89* 82*  ALT 33 27  ALKPHOS 113 105  BILITOT 1.4* 0.9   Hematology Recent Labs  Lab 05/04/18 1735  WBC 7.3  RBC 4.12  HGB 13.3  HCT 38.9  MCV 94.4  MCH 32.3  MCHC 34.2  RDW 11.4*  PLT 241   Cardiac EnzymesNo results for input(s): TROPONINI in the last 168 hours.  Recent Labs  Lab 05/04/18 1749 05/05/18 0108  TROPIPOC 0.01 0.01    BNPNo results for input(s): BNP, PROBNP in the last 168 hours.  DDimer  Recent Labs  Lab 05/04/18 1735  DDIMER 1.25*    Radiology/Studies:  Dg Chest 2 View  Result Date: 05/04/2018 CLINICAL DATA:  Chest pain EXAM:  CHEST - 2 VIEW COMPARISON:  02/22/2018 FINDINGS: No acute consolidation or effusion. Normal heart size. No pneumothorax. There are small nodular opacities in the right mid lung. There is a healing right eighth rib fracture. IMPRESSION: 1. No acute pulmonary infiltrate. 2. Small nodular opacities in the right mid lung. Consider chest CT for further evaluation. Electronically Signed   By: Donavan Foil M.D.   On: 05/04/2018 20:49   Ct Chest Wo Contrast  Result Date: 05/05/2018 CLINICAL DATA:  63 year old female with dyspnea. EXAM: CT CHEST WITHOUT CONTRAST TECHNIQUE: Multidetector CT imaging of the chest was performed following the standard protocol without IV contrast. COMPARISON:  Same day CXR FINDINGS: Cardiovascular: Normal heart size without pericardial effusion. Coronary arteriosclerosis along the main and LAD. Nonaneurysmal minimally atherosclerotic thoracic aorta. The unenhanced pulmonary arteries unremarkable. Mediastinum/Nodes: No enlarged mediastinal or axillary lymph  nodes. Thyroid gland, trachea, and esophagus demonstrate no significant findings. Lungs/Pleura: Streaky scarring and/or atelectasis in the right upper lobe. No dominant mass, effusion or consolidations. Apical pleuroparenchymal scarring is noted bilaterally. Upper Abdomen: Steatosis of liver. Cholecystectomy clips are present. No adrenal mass. Musculoskeletal: Healing right eighth and ninth rib fractures with callus as well as costochondral calcifications on the right are believed to account for the nodular density seen on the earlier same day chest radiograph. IMPRESSION: No pulmonary nodule identified. The nodular densities seen on earlier same day chest radiograph are felt secondary to costochondral calcifications as well as callus from prior eighth and ninth posterior rib fractures. Mild coronary arteriosclerosis. Hepatic steatosis. Aortic Atherosclerosis (ICD10-I70.0). Electronically Signed   By: Ashley Royalty M.D.   On: 05/05/2018 00:47    Ct Angio Chest Pe W And/or Wo Contrast  Result Date: 05/05/2018 CLINICAL DATA:  PE suspected. Intermediate probability. Positive D-dimer. EXAM: CT ANGIOGRAPHY CHEST WITH CONTRAST TECHNIQUE: Multidetector CT imaging of the chest was performed using the standard protocol during bolus administration of intravenous contrast. Multiplanar CT image reconstructions and MIPs were obtained to evaluate the vascular anatomy. CONTRAST:  162mL ISOVUE-370 IOPAMIDOL (ISOVUE-370) INJECTION 76% COMPARISON:  Noncontrast CT of earlier today. Chest radiograph of 1 day prior. FINDINGS: Cardiovascular: The quality of this exam for evaluation of pulmonary embolism is good. The bolus is well timed. No evidence of pulmonary embolism. Normal caliber of the aorta and branch vessels. Normal heart size, without pericardial effusion. Right coronary artery atherosclerosis. Mediastinum/Nodes: No mediastinal or hilar adenopathy. Tiny hiatal hernia. Lungs/Pleura: No pleural fluid. Subsegmental atelectasis or scarring involves the right middle lobe. There is minimal dependent atelectasis which is new compared to earlier in the morning. Upper Abdomen: Marked hepatic steatosis. Normal imaged portions of the spleen. Musculoskeletal: Remote lower right rib fractures Review of the MIP images confirms the above findings. IMPRESSION: 1. No evidence of pulmonary embolism. 2. Marked hepatic steatosis. 3. Age advanced coronary artery atherosclerosis. Recommend assessment of coronary risk factors and consideration of medical therapy. Electronically Signed   By: Abigail Miyamoto M.D.   On: 05/05/2018 09:48    Assessment and Plan:   KALAYA INFANTINO is a 63 y.o. female with a hx of ETOH abuse, Depression, GERD, HL, Osteroporosis, MVP, and SVT who is being seen today for the evaluation of chest pain at the request of Dr. Sloan Leiter.  1. Chest Pain: etiology uncertain at this time. Episodes mostly with exertion while in the hospital. She has ruled out. EKG without  ischemia. CTA did read with calcification in the RCA. Do not think that a stress test would be beneficial as she already has calcification on CT. Discussed with patient and will proceed with cardiac cath today.  -- The patient understands that risks included but are not limited to stroke (1 in 1000), death (1 in 82), kidney failure [usually temporary] (1 in 500), bleeding (1 in 200), allergic reaction [possibly serious] (1 in 200).   2.  N/v: resolved  3. ETOH abuse: per primary  4. Hx of SVT: stable on propanolol.   For questions or updates, please contact [place] Please consult [email or web address] for contact info under   Signed, Reino Bellis, NP  05/07/2018 10:32 AM  Agree with note by Reino Bellis NP-C  We are asked to see Ms. Millspaugh by Dr. Evalee Mutton because of chest pain.  She does have a history of ethanol abuse which she is trying to reduce and may be withdrawing from.  Other problems  include history of SVT in the past.  She is complained of chest pain which is somewhat episodic and atypical.  Her CT chest showed no pulmonary embolism but RCA calcification.  Her exam is benign.  I favor diagnostic coronary angiography to define her anatomy and rule out an ischemic etiology.  Lorretta Harp, M.D., [place], [facility], [place] [address] [place], [place]  [address]  [phone] 05/07/2018 11:02 AM

## 2018-05-08 ENCOUNTER — Encounter (HOSPITAL_COMMUNITY): Payer: Self-pay | Admitting: Interventional Cardiology

## 2018-05-08 DIAGNOSIS — F329 Major depressive disorder, single episode, unspecified: Secondary | ICD-10-CM

## 2018-05-08 MED ORDER — PANTOPRAZOLE SODIUM 40 MG PO TBEC: 40.0000 mg | DELAYED_RELEASE_TABLET | Freq: Two times a day (BID) | ORAL | 0 refills | Status: DC

## 2018-05-08 MED ORDER — THIAMINE HCL 100 MG PO TABS: 100.0000 mg | ORAL_TABLET | Freq: Every day | ORAL | 0 refills | Status: DC

## 2018-05-08 NOTE — Progress Notes (Signed)
CSW received consult regarding ETOH use. Patient reports that she has tried everything but nothing has worked. She did accept resources that CSW provided and reports that she knows the alcohol use is affecting her health. She states she would like home health and was given a list by [facility]. She states that she has a ride home. No other needs reported.  CSW signing off. Please re-consult if needed.   Percell Locus Dquan Cortopassi LCSW [phone]

## 2018-05-08 NOTE — Progress Notes (Signed)
Awaiting for pt to return CM call. Called pt again and left another VM to return CM call.

## 2018-05-08 NOTE — Progress Notes (Addendum)
Progress Note  Patient Name: Terri Ayala Date of Encounter: 05/08/2018  Primary Cardiologist: Lauree Chandler, MD   Subjective   No chest pain this morning.   Inpatient Medications    Scheduled Meds: . aspirin EC  81 mg Oral Daily  . busPIRone  15 mg Oral TID  . DULoxetine  60 mg Oral BID  . enoxaparin (LOVENOX) injection  40 mg Subcutaneous Q24H  . folic acid  1 mg Oral Daily  . hydrOXYzine  25 mg Oral BID  . LORazepam  0-4 mg Intravenous Q12H   Or  . LORazepam  0-4 mg Oral Q12H  . multivitamin with minerals  1 tablet Oral Daily  . norethindrone-ethinyl estradiol  1 tablet Oral Daily  . pantoprazole  40 mg Oral BID  . propranolol  10 mg Oral BID  . sodium chloride flush  3 mL Intravenous Q12H  . thiamine  100 mg Oral Daily   Or  . thiamine  100 mg Intravenous Daily   Continuous Infusions: . sodium chloride     PRN Meds: sodium chloride, acetaminophen, ondansetron **OR** ondansetron (ZOFRAN) IV, sodium chloride flush, traZODone   Vital Signs    Vitals:   05/07/18 2057 05/07/18 2133 05/08/18 0554 05/08/18 0728  BP: (!) 106/42 99/63 118/77 115/74  Pulse: 90 89 89 92  Resp: 11  15 16   Temp: 98.5 F (36.9 C)  98.3 F (36.8 C) 98.5 F (36.9 C)  TempSrc: Oral  Oral Oral  SpO2: 98%  99% 98%  Weight:   65.5 kg   Height:        Intake/Output Summary (Last 24 hours) at 05/08/2018 0747 Last data filed at 05/08/2018 8101 Gross per 24 hour  Intake 823.75 ml  Output 700 ml  Net 123.75 ml   Last 3 Weights 05/08/2018 05/07/2018 05/04/2018  Weight (lbs) 144 lb 6.4 oz 141 lb 6.4 oz 141 lb 5 oz  Weight (kg) 65.5 kg 64.139 kg 64.1 kg  Some encounter information is confidential and restricted. Go to Review Flowsheets activity to see all data.      Telemetry    SR - Personally Reviewed  ECG    N/a - Personally Reviewed  Physical Exam   GEN: No acute distress.   Neck: No JVD Cardiac: RRR, no murmurs, rubs, or gallops.  Respiratory: Clear to auscultation  bilaterally. GI: Soft, nontender, non-distended  MS: No edema; No deformity. Right radial cath site stable.  Neuro:  Nonfocal  Psych: Normal affect   Labs    Chemistry Recent Labs  Lab 05/04/18 1735 05/05/18 0340 05/06/18 0558 05/07/18 1643  NA 133* 134* 137  --   K 4.2 2.9* 3.5  --   CL 85* 91* 102  --   CO2 27 25 26   --   GLUCOSE 108* 84 89  --   BUN 21 20 15   --   CREATININE 1.34* 1.13* 0.92 0.81  CALCIUM 10.9* 9.2 8.6*  --   PROT  --  5.8* 5.4*  --   ALBUMIN  --  3.1* 2.8*  --   AST  --  89* 82*  --   ALT  --  33 27  --   ALKPHOS  --  113 105  --   BILITOT  --  1.4* 0.9  --   GFRNONAA 42* 52* >60 >60  GFRAA 49* 60* >60 >60  ANIONGAP 21* 18* 9  --      Hematology Recent Labs  Lab 05/04/18  1735 05/07/18 1643  WBC 7.3 6.5  RBC 4.12 3.35*  HGB 13.3 10.6*  HCT 38.9 32.9*  MCV 94.4 98.2  MCH 32.3 31.6  MCHC 34.2 32.2  RDW 11.4* 11.8  PLT 241 239    Cardiac EnzymesNo results for input(s): TROPONINI in the last 168 hours.  Recent Labs  Lab 05/04/18 1749 05/05/18 0108  TROPIPOC 0.01 0.01     BNPNo results for input(s): BNP, PROBNP in the last 168 hours.   DDimer  Recent Labs  Lab 05/04/18 1735  DDIMER 1.25*     Radiology    No results found.  Cardiac Studies   Cath: 05/07/2018   Right dominant coronary anatomy  Normal left main  Widely patent LAD with diffuse luminal irregularities throughout the mid and distal vessel.  No focally obstructive CAD is noted.  Widely patent circumflex without focally obstructive CAD.  Widely patent right coronary without evidence of focally obstructive disease.  Normal LV systolic function with LVEF 65%.  LVEDP is normal.  RECOMMENDATIONS:   No obstructive coronary disease is identified.  Highly tortuous coronary arteries.  Consider alternative explanations for chest discomfort. Doubt INOCA.  TTE: 05/05/2018  IMPRESSIONS    1. The left ventricle has normal systolic function with an ejection  fraction of 60-65%. The cavity size was normal. There is mildly increased left ventricular wall thickness. Left ventricular diastolic Doppler parameters are indeterminate. No evidence  of left ventricular regional wall motion abnormalities.  2. The right ventricle has normal systolic function. The cavity was normal. There is no increase in right ventricular wall thickness. Right ventricular systolic pressure normal with an estimated pressure of 19.6 mmHg.  3. The aortic valve is tricuspid Mild aortic annular calcification noted.  4. The mitral valve is normal in structure.  5. The tricuspid valve is normal in structure.  6. The aortic root is normal in size and structure.  Patient Profile     63 y.o. female with a hx of ETOH abuse, Depression, GERD, HL, Osteroporosis, MVP, and SVT who was seen for the evaluation of chest pain at the request of Dr. Sloan Leiter.  Assessment & Plan    1. Chest Pain: cath yesterday with normal coronaries. Echo with normal EF. Non-cardiac chest pain. Radial site instructions given. Recommend follow up with PCP after discharge.   2.  N/v: resolved  3. ETOH abuse: per primary  4. Hx of SVT: stable on propanolol.   [facility] will sign off.   Medication Recommendations:  Noted above Other recommendations (labs, testing, etc):  none Follow up as an outpatient:  With PCP  For questions or updates, please contact [facility] Please consult [email or web address] for contact info under    Signed, Reino Bellis, NP  05/08/2018, 7:47 AM     Agree with note by Reino Bellis NP-C  Cardiac catheterization yesterday revealed normal coronary arteries and normal LV function.  I suspect her chest pain was noncardiac.  She is scheduled for discharge home today.  She is had no further chest pain.  Lorretta Harp, M.D., [place], [facility], [place] [address] [place], [place]   [id number]  [phone] 05/08/2018 10:10 AM

## 2018-05-08 NOTE — Discharge Summary (Signed)
PATIENT DETAILS Name: Terri Ayala Age: 63 y.o. Sex: female Date of Birth: 31-Oct-1955 MRN: [id number]. Admitting Physician: Etta Quill, DO UMP:NTIRWER, No Pcp Per  Admit Date: 05/04/2018 Discharge date: 05/08/2018  Recommendations for Outpatient Follow-up:  1. Follow up with PCP in 1-2 weeks 2. Please obtain BMP/CBC in one week 3. Please continue counseling regarding importance of avoiding all alcohol use  Admitted From:  Home  Disposition: [place]: Yes  Equipment/Devices: None  Discharge Condition: Stable  CODE STATUS: FULL CODEe  Diet recommendation:  Heart Healthy  Brief Summary: See H&P, Labs, Consult and Test reports for all details in brief, Patient is a 63 y.o. female with history of EtOH use, depression, SVT evaluation of chest pain and alcohol withdrawal symptoms.  See below for further details  Brief Hospital Course: Chest pain: Atypical-high suspicion that this is related to alcohol related gastritis.  CTA chest was negative for PE, echocardiogram with preserved EF.  Troponins were negative.  EKG was nonacute.  Since continued to have pain which was apparently exertional in nature-cardiology was consulted-patient underwent LHC which was negative for CAD.  Continue PPI discharge.  Nausea/vomiting: Resolved-abdominal exam is completely benign and nontender.  Continue supportive care.    History of SVT: Continue propanolol  Depression: Appears to have a flat affect-continue BuSpar, Cymbalta-valuated by psychiatry-no further recommendations apart from outpatient follow-up.  EtOH use with withdrawal symptoms: Last drink was 3/6 morning-tno tremors-completely awake and alert.  Counseled regarding importance of avoiding alcohol use.  Will ask social work/case management to provide outpatient resources prior to discharge.  Hypokalemia/hypomagnesemia: Likely secondary to alcohol use-repleted-recheck periodically in the outpatient  setting  Procedures/Studies: 3/9>> LHC  Discharge Diagnoses:  Principal Problem:   Chest pain Active Problems:   Alcohol use disorder, severe, dependence ([place])   Discharge Instructions:  Activity:  As tolerated with Full fall precautions use walker/cane & assistance as needed   Discharge Instructions    Call MD for:  persistant nausea and vomiting   Complete by:  As directed    Call MD for:  severe uncontrolled pain   Complete by:  As directed    Diet - low sodium heart healthy   Complete by:  As directed    Discharge instructions   Complete by:  As directed    Follow with Primary MD in week  Stop using alcohol   Please get a complete blood count and chemistry panel checked by your Primary MD at your next visit, and again as instructed by your Primary MD.  Get Medicines reviewed and adjusted: Please take all your medications with you for your next visit with your Primary MD  Laboratory/radiological data: Please request your Primary MD to go over all hospital tests and procedure/radiological results at the follow up, please ask your Primary MD to get all Hospital records sent to his/her office.  In some cases, they will be blood work, cultures and biopsy results pending at the time of your discharge. Please request that your primary care M.D. follows up on these results.  Also Note the following: If you experience worsening of your admission symptoms, develop shortness of breath, life threatening emergency, suicidal or homicidal thoughts you must seek medical attention immediately by calling 911 or calling your MD immediately  if symptoms less severe.  You must read complete instructions/literature along with all the possible adverse reactions/side effects for all the Medicines you take and that have been prescribed to you. Take any new  Medicines after you have completely understood and accpet all the possible adverse reactions/side effects.   Do not drive when taking  Pain medications or sleeping medications (Benzodaizepines)  Do not take more than prescribed Pain, Sleep and Anxiety Medications. It is not advisable to combine anxiety,sleep and pain medications without talking with your primary care practitioner  Special Instructions: If you have smoked or chewed Tobacco  in the last 2 yrs please stop smoking, stop any regular Alcohol  and or any Recreational drug use.  Wear Seat belts while driving.  Please note: You were cared for by a hospitalist during your hospital stay. Once you are discharged, your primary care physician will handle any further medical issues. Please note that NO REFILLS for any discharge medications will be authorized once you are discharged, as it is imperative that you return to your primary care physician (or establish a relationship with a primary care physician if you do not have one) for your post hospital discharge needs so that they can reassess your need for medications and monitor your lab values.   Increase activity slowly   Complete by:  As directed      Allergies as of 05/08/2018      Reactions   Doxycycline Swelling   Mouth swelling and sores   Codeine Itching   Tetracycline Hcl Swelling   Mouth swelling and sores      Medication List    TAKE these medications   alendronate 70 MG tablet Commonly known as:  FOSAMAX Take 70 mg by mouth every Monday. Take with a full glass of water on an empty stomach.   aspirin EC 81 MG tablet Take 81 mg by mouth daily.   busPIRone 15 MG tablet Commonly known as:  BUSPAR Take 15 mg by mouth 3 (three) times daily.   diclofenac sodium 1 % Gel Commonly known as:  VOLTAREN Apply 2 g topically 4 (four) times daily. What changed:    when to take this  reasons to take this   DULoxetine 60 MG capsule Commonly known as:  CYMBALTA TAKE 1 CAPSULE (60 MG TOTAL) BY MOUTH DAILY. What changed:  See the new instructions.   hydrOXYzine 25 MG tablet Commonly known as:   ATARAX/VISTARIL Take 25 mg by mouth 2 (two) times daily.   lidocaine 5 % Commonly known as:  Lidoderm Place 1 patch onto the skin daily. Remove & Discard patch within 12 hours or as directed by MD What changed:    when to take this  reasons to take this   multivitamin with minerals Tabs tablet Take 1 tablet by mouth daily.   norethindrone-ethinyl estradiol 1-5 MG-MCG Tabs tablet Commonly known as:  FEMHRT 1/5 Take 1 tablet by mouth daily. For Osteoporosis   ondansetron 4 MG disintegrating tablet Commonly known as:  Zofran ODT Take 1 tablet (4 mg total) by mouth every 8 (eight) hours as needed for nausea or vomiting.   pantoprazole 40 MG tablet Commonly known as:  PROTONIX Take 1 tablet (40 mg total) by mouth 2 (two) times daily. What changed:  when to take this   propranolol 10 MG tablet Commonly known as:  INDERAL Take 10 mg by mouth 2 (two) times daily.   SUMAtriptan 100 MG tablet Commonly known as:  IMITREX Take 1 tablet at the first sign of migraine.  May repeat in 2 hours. What changed:    how much to take  how to take this  when to take this  reasons to  take this  additional instructions   thiamine 100 MG tablet Take 1 tablet (100 mg total) by mouth daily.   traZODone 50 MG tablet Commonly known as:  DESYREL Take 50 mg by mouth at bedtime as needed for sleep.      Follow-up Information    Burnell Blanks, MD. Schedule an appointment as soon as possible for a visit in 1 week(s).   Specialty:  Cardiology Contact information: [address] [place] [address] [phone]          Allergies  Allergen Reactions  . Doxycycline Swelling    Mouth swelling and sores  . Codeine Itching  . Tetracycline Hcl Swelling    Mouth swelling and sores    Consultations:   cardiology and psychiatry  Other Procedures/Studies: Dg Chest 2 View  Result Date: 05/04/2018 CLINICAL DATA:  Chest pain EXAM: CHEST - 2 VIEW COMPARISON:   02/22/2018 FINDINGS: No acute consolidation or effusion. Normal heart size. No pneumothorax. There are small nodular opacities in the right mid lung. There is a healing right eighth rib fracture. IMPRESSION: 1. No acute pulmonary infiltrate. 2. Small nodular opacities in the right mid lung. Consider chest CT for further evaluation. Electronically Signed   By: Donavan Foil M.D.   On: 05/04/2018 20:49   Ct Chest Wo Contrast  Result Date: 05/05/2018 CLINICAL DATA:  63 year old female with dyspnea. EXAM: CT CHEST WITHOUT CONTRAST TECHNIQUE: Multidetector CT imaging of the chest was performed following the standard protocol without IV contrast. COMPARISON:  Same day CXR FINDINGS: Cardiovascular: Normal heart size without pericardial effusion. Coronary arteriosclerosis along the main and LAD. Nonaneurysmal minimally atherosclerotic thoracic aorta. The unenhanced pulmonary arteries unremarkable. Mediastinum/Nodes: No enlarged mediastinal or axillary lymph nodes. Thyroid gland, trachea, and esophagus demonstrate no significant findings. Lungs/Pleura: Streaky scarring and/or atelectasis in the right upper lobe. No dominant mass, effusion or consolidations. Apical pleuroparenchymal scarring is noted bilaterally. Upper Abdomen: Steatosis of liver. Cholecystectomy clips are present. No adrenal mass. Musculoskeletal: Healing right eighth and ninth rib fractures with callus as well as costochondral calcifications on the right are believed to account for the nodular density seen on the earlier same day chest radiograph. IMPRESSION: No pulmonary nodule identified. The nodular densities seen on earlier same day chest radiograph are felt secondary to costochondral calcifications as well as callus from prior eighth and ninth posterior rib fractures. Mild coronary arteriosclerosis. Hepatic steatosis. Aortic Atherosclerosis (ICD10-I70.0). Electronically Signed   By: Ashley Royalty M.D.   On: 05/05/2018 00:47   Ct Angio Chest Pe W  And/or Wo Contrast  Result Date: 05/05/2018 CLINICAL DATA:  PE suspected. Intermediate probability. Positive D-dimer. EXAM: CT ANGIOGRAPHY CHEST WITH CONTRAST TECHNIQUE: Multidetector CT imaging of the chest was performed using the standard protocol during bolus administration of intravenous contrast. Multiplanar CT image reconstructions and MIPs were obtained to evaluate the vascular anatomy. CONTRAST:  168mL ISOVUE-370 IOPAMIDOL (ISOVUE-370) INJECTION 76% COMPARISON:  Noncontrast CT of earlier today. Chest radiograph of 1 day prior. FINDINGS: Cardiovascular: The quality of this exam for evaluation of pulmonary embolism is good. The bolus is well timed. No evidence of pulmonary embolism. Normal caliber of the aorta and branch vessels. Normal heart size, without pericardial effusion. Right coronary artery atherosclerosis. Mediastinum/Nodes: No mediastinal or hilar adenopathy. Tiny hiatal hernia. Lungs/Pleura: No pleural fluid. Subsegmental atelectasis or scarring involves the right middle lobe. There is minimal dependent atelectasis which is new compared to earlier in the morning. Upper Abdomen: Marked hepatic steatosis. Normal imaged  portions of the spleen. Musculoskeletal: Remote lower right rib fractures Review of the MIP images confirms the above findings. IMPRESSION: 1. No evidence of pulmonary embolism. 2. Marked hepatic steatosis. 3. Age advanced coronary artery atherosclerosis. Recommend assessment of coronary risk factors and consideration of medical therapy. Electronically Signed   By: Abigail Miyamoto M.D.   On: 05/05/2018 09:48     TODAY-DAY OF DISCHARGE:  Subjective:   [place] Esters today has no headache,no chest abdominal pain,no new weakness tingling or numbness, feels much better wants to go home today.   Objective:   Blood pressure 118/77, pulse 89, temperature 98.3 F (36.8 C), temperature source Oral, resp. rate 15, height 5\' 1"  (1.549 m), weight 65.5 kg, SpO2 99 %.  Intake/Output Summary  (Last 24 hours) at 05/08/2018 0743 Last data filed at 05/08/2018 2130 Gross per 24 hour  Intake 823.75 ml  Output 700 ml  Net 123.75 ml   Filed Weights   05/04/18 0400 05/07/18 1435 05/08/18 0554  Weight: 64.1 kg 64.1 kg 65.5 kg    Exam: Awake Alert, Oriented *3, No new F.N deficits, Normal affect [place].AT,PERRAL Supple Neck,No JVD, No cervical lymphadenopathy appriciated.  Symmetrical Chest wall movement, Good air movement bilaterally, CTAB RRR,No Gallops,Rubs or new Murmurs, No Parasternal Heave +ve B.Sounds, Abd Soft, Non tender, No organomegaly appriciated, No rebound -guarding or rigidity. No Cyanosis, Clubbing or edema, No new Rash or bruise   PERTINENT RADIOLOGIC STUDIES: Dg Chest 2 View  Result Date: 05/04/2018 CLINICAL DATA:  Chest pain EXAM: CHEST - 2 VIEW COMPARISON:  02/22/2018 FINDINGS: No acute consolidation or effusion. Normal heart size. No pneumothorax. There are small nodular opacities in the right mid lung. There is a healing right eighth rib fracture. IMPRESSION: 1. No acute pulmonary infiltrate. 2. Small nodular opacities in the right mid lung. Consider chest CT for further evaluation. Electronically Signed   By: Donavan Foil M.D.   On: 05/04/2018 20:49   Ct Chest Wo Contrast  Result Date: 05/05/2018 CLINICAL DATA:  63 year old female with dyspnea. EXAM: CT CHEST WITHOUT CONTRAST TECHNIQUE: Multidetector CT imaging of the chest was performed following the standard protocol without IV contrast. COMPARISON:  Same day CXR FINDINGS: Cardiovascular: Normal heart size without pericardial effusion. Coronary arteriosclerosis along the main and LAD. Nonaneurysmal minimally atherosclerotic thoracic aorta. The unenhanced pulmonary arteries unremarkable. Mediastinum/Nodes: No enlarged mediastinal or axillary lymph nodes. Thyroid gland, trachea, and esophagus demonstrate no significant findings. Lungs/Pleura: Streaky scarring and/or atelectasis in the right upper lobe. No dominant  mass, effusion or consolidations. Apical pleuroparenchymal scarring is noted bilaterally. Upper Abdomen: Steatosis of liver. Cholecystectomy clips are present. No adrenal mass. Musculoskeletal: Healing right eighth and ninth rib fractures with callus as well as costochondral calcifications on the right are believed to account for the nodular density seen on the earlier same day chest radiograph. IMPRESSION: No pulmonary nodule identified. The nodular densities seen on earlier same day chest radiograph are felt secondary to costochondral calcifications as well as callus from prior eighth and ninth posterior rib fractures. Mild coronary arteriosclerosis. Hepatic steatosis. Aortic Atherosclerosis (ICD10-I70.0). Electronically Signed   By: Ashley Royalty M.D.   On: 05/05/2018 00:47   Ct Angio Chest Pe W And/or Wo Contrast  Result Date: 05/05/2018 CLINICAL DATA:  PE suspected. Intermediate probability. Positive D-dimer. EXAM: CT ANGIOGRAPHY CHEST WITH CONTRAST TECHNIQUE: Multidetector CT imaging of the chest was performed using the standard protocol during bolus administration of intravenous contrast. Multiplanar CT image reconstructions and MIPs were obtained to evaluate the  vascular anatomy. CONTRAST:  138mL ISOVUE-370 IOPAMIDOL (ISOVUE-370) INJECTION 76% COMPARISON:  Noncontrast CT of earlier today. Chest radiograph of 1 day prior. FINDINGS: Cardiovascular: The quality of this exam for evaluation of pulmonary embolism is good. The bolus is well timed. No evidence of pulmonary embolism. Normal caliber of the aorta and branch vessels. Normal heart size, without pericardial effusion. Right coronary artery atherosclerosis. Mediastinum/Nodes: No mediastinal or hilar adenopathy. Tiny hiatal hernia. Lungs/Pleura: No pleural fluid. Subsegmental atelectasis or scarring involves the right middle lobe. There is minimal dependent atelectasis which is new compared to earlier in the morning. Upper Abdomen: Marked hepatic steatosis.  Normal imaged portions of the spleen. Musculoskeletal: Remote lower right rib fractures Review of the MIP images confirms the above findings. IMPRESSION: 1. No evidence of pulmonary embolism. 2. Marked hepatic steatosis. 3. Age advanced coronary artery atherosclerosis. Recommend assessment of coronary risk factors and consideration of medical therapy. Electronically Signed   By: Abigail Miyamoto M.D.   On: 05/05/2018 09:48     PERTINENT LAB RESULTS: CBC: Recent Labs    05/07/18 1643  WBC 6.5  HGB 10.6*  HCT 32.9*  PLT 239   CMET CMP     Component Value Date/Time   NA 137 05/06/2018 0558   NA 139 09/01/2017 1448   K 3.5 05/06/2018 0558   CL 102 05/06/2018 0558   CO2 26 05/06/2018 0558   GLUCOSE 89 05/06/2018 0558   BUN 15 05/06/2018 0558   BUN 16 09/01/2017 1448   CREATININE 0.81 05/07/2018 1643   CREATININE 0.39 (L) 04/22/2013 1416   CALCIUM 8.6 (L) 05/06/2018 0558   PROT 5.4 (L) 05/06/2018 0558   PROT 6.8 09/01/2017 1448   ALBUMIN 2.8 (L) 05/06/2018 0558   ALBUMIN 4.3 09/01/2017 1448   AST 82 (H) 05/06/2018 0558   ALT 27 05/06/2018 0558   ALKPHOS 105 05/06/2018 0558   BILITOT 0.9 05/06/2018 0558   BILITOT 0.3 09/01/2017 1448   GFRNONAA >60 05/07/2018 1643   GFRAA >60 05/07/2018 1643    GFR Estimated Creatinine Clearance: 61.6 mL/min (by C-G formula based on SCr of 0.81 mg/dL). No results for input(s): LIPASE, AMYLASE in the last 72 hours. No results for input(s): CKTOTAL, CKMB, CKMBINDEX, TROPONINI in the last 72 hours. Invalid input(s): POCBNP No results for input(s): DDIMER in the last 72 hours. No results for input(s): HGBA1C in the last 72 hours. No results for input(s): CHOL, HDL, LDLCALC, TRIG, CHOLHDL, LDLDIRECT in the last 72 hours. No results for input(s): TSH, T4TOTAL, T3FREE, THYROIDAB in the last 72 hours.  Invalid input(s): FREET3 No results for input(s): VITAMINB12, FOLATE, FERRITIN, TIBC, IRON, RETICCTPCT in the last 72 hours. Coags: No results for  input(s): INR in the last 72 hours.  Invalid input(s): PT Microbiology: No results found for this or any previous visit (from the past 240 hour(s)).  FURTHER DISCHARGE INSTRUCTIONS:  Get Medicines reviewed and adjusted: Please take all your medications with you for your next visit with your Primary MD  Laboratory/radiological data: Please request your Primary MD to go over all hospital tests and procedure/radiological results at the follow up, please ask your Primary MD to get all Hospital records sent to his/her office.  In some cases, they will be blood work, cultures and biopsy results pending at the time of your discharge. Please request that your primary care M.D. goes through all the records of your hospital data and follows up on these results.  Also Note the following: If you experience worsening of  your admission symptoms, develop shortness of breath, life threatening emergency, suicidal or homicidal thoughts you must seek medical attention immediately by calling 911 or calling your MD immediately  if symptoms less severe.  You must read complete instructions/literature along with all the possible adverse reactions/side effects for all the Medicines you take and that have been prescribed to you. Take any new Medicines after you have completely understood and accpet all the possible adverse reactions/side effects.   Do not drive when taking Pain medications or sleeping medications (Benzodaizepines)  Do not take more than prescribed Pain, Sleep and Anxiety Medications. It is not advisable to combine anxiety,sleep and pain medications without talking with your primary care practitioner  Special Instructions: If you have smoked or chewed Tobacco  in the last 2 yrs please stop smoking, stop any regular Alcohol  and or any Recreational drug use.  Wear Seat belts while driving.  Please note: You were cared for by a hospitalist during your hospital stay. Once you are discharged, your  primary care physician will handle any further medical issues. Please note that NO REFILLS for any discharge medications will be authorized once you are discharged, as it is imperative that you return to your primary care physician (or establish a relationship with a primary care physician if you do not have one) for your post hospital discharge needs so that they can reassess your need for medications and monitor your lab values.  Total Time spent coordinating discharge including counseling, education and face to face time equals 25 minutes.  SignedOren Binet 05/08/2018 7:43 AM

## 2018-05-08 NOTE — Progress Notes (Signed)
Received call from [place] at [facility]. She stated that they are not able to accept the referral because they accepted the referral in the past and pt never got a PCP. Contacted pt at [phone] and left a VM to contact CM.

## 2018-05-08 NOTE — Progress Notes (Signed)
TR BAND REMOVAL  LOCATION:    right radial  DEFLATED PER PROTOCOL:    Yes.    TIME BAND OFF / DRESSING APPLIED:    2002   SITE UPON ARRIVAL:    Level 0  SITE AFTER BAND REMOVAL:    Level 0  CIRCULATION SENSATION AND MOVEMENT:    Within Normal Limits   Yes.    COMMENTS:   Post TR band instructions given, sterile dressing applied, good capillary refill.

## 2018-05-08 NOTE — Care Management Note (Signed)
Case Management Note  Patient Details  Name: Terri Ayala MRN: [id number] Date of Birth: 1955-03-28  Subjective/Objective: 63 yo F presented to the ED with nausea and vomiting for the past several days. Pt reports increased alcohol consumption and withdrawal symptoms.    Action/Plan: Received referral to assist with HHPT   Expected Discharge Date:  05/08/18               Expected Discharge Plan:  [place]  In-House Referral:     Discharge planning Services  CM Consult  Post Acute Care Choice:  Home Health Choice offered to:  Patient  DME Arranged:    DME Agency:     HH Arranged:  PT [place]:  [place]  Status of Service:  Completed, signed off  If discussed at [place] of Stay Meetings, dates discussed:    Additional Comments: Met with pt at beside. She lives alone. She reports that she is able to manage her ADL's by herself, but she has a neighbor that would help if needed. She has a rolling walker, shower chair and a cane. Discussed HH PT referral. Provided pt with a CMS Medicare [place] list. She chose [facility]. Contacted Dorian Pod at [facility], but she did not accept the referral because they are negotiating another contract with [facility]. Met with pt again and her second option is [place]. Contacted Dan at [facility] for referral. [facility] was not able to accept the referral because pt doesn't have a PCP. Pt reports that she is getting a new PCP at [facility] and she needs to call to make an appointment. Discussed with pt the importance of having a PCP and she needs to f/u with the PCP after being D/C from the hospital. She verbalized understanding. Contacted Hoyle Sauer at [facility] and she accepted the referral and pt agreed with agency. Informed pt that [facility] is the new name for [facility] and they will f/u.  Norina Buzzard, RN 05/08/2018, 1:43 PM

## 2018-05-16 DIAGNOSIS — F419 Anxiety disorder, unspecified: Secondary | ICD-10-CM | POA: Diagnosis not present

## 2018-05-16 DIAGNOSIS — F1020 Alcohol dependence, uncomplicated: Secondary | ICD-10-CM | POA: Diagnosis not present

## 2018-05-16 DIAGNOSIS — F3341 Major depressive disorder, recurrent, in partial remission: Secondary | ICD-10-CM | POA: Diagnosis not present

## 2018-06-26 ENCOUNTER — Other Ambulatory Visit: Payer: Self-pay

## 2018-06-26 ENCOUNTER — Ambulatory Visit (INDEPENDENT_AMBULATORY_CARE_PROVIDER_SITE_OTHER): Payer: Federal, State, Local not specified - PPO | Admitting: Adult Health

## 2018-06-26 ENCOUNTER — Encounter: Payer: Self-pay | Admitting: General Practice

## 2018-06-26 ENCOUNTER — Encounter: Payer: Self-pay | Admitting: Adult Health

## 2018-06-26 VITALS — Ht 62.0 in

## 2018-06-26 DIAGNOSIS — I10 Essential (primary) hypertension: Secondary | ICD-10-CM

## 2018-06-26 DIAGNOSIS — D72829 Elevated white blood cell count, unspecified: Secondary | ICD-10-CM

## 2018-06-26 DIAGNOSIS — E042 Nontoxic multinodular goiter: Secondary | ICD-10-CM | POA: Diagnosis not present

## 2018-06-26 DIAGNOSIS — F1020 Alcohol dependence, uncomplicated: Secondary | ICD-10-CM

## 2018-06-26 DIAGNOSIS — F331 Major depressive disorder, recurrent, moderate: Secondary | ICD-10-CM

## 2018-06-26 DIAGNOSIS — G43009 Migraine without aura, not intractable, without status migrainosus: Secondary | ICD-10-CM | POA: Diagnosis not present

## 2018-06-26 DIAGNOSIS — Z0000 Encounter for general adult medical examination without abnormal findings: Secondary | ICD-10-CM | POA: Insufficient documentation

## 2018-06-26 DIAGNOSIS — F411 Generalized anxiety disorder: Secondary | ICD-10-CM

## 2018-06-26 DIAGNOSIS — K760 Fatty (change of) liver, not elsewhere classified: Secondary | ICD-10-CM | POA: Diagnosis not present

## 2018-06-26 MED ORDER — PROPRANOLOL HCL 10 MG PO TABS: 10.0000 mg | ORAL_TABLET | Freq: Two times a day (BID) | ORAL | 1 refills | Status: DC

## 2018-06-26 MED ORDER — SUMATRIPTAN SUCCINATE 100 MG PO TABS: ORAL_TABLET | ORAL | 5 refills | Status: DC

## 2018-06-26 MED ORDER — PANTOPRAZOLE SODIUM 40 MG PO TBEC: 40.0000 mg | DELAYED_RELEASE_TABLET | Freq: Two times a day (BID) | ORAL | 0 refills | Status: DC

## 2018-06-26 NOTE — Assessment & Plan Note (Signed)
Instructed to think about all the programs that she has tried in past and decide which program may be most beneficial most forward

## 2018-06-26 NOTE — Assessment & Plan Note (Signed)
Urgent referral to Psychiatry placed Currently on Duloxetine 60mg  BID, Buspar 15mg  TID

## 2018-06-26 NOTE — Progress Notes (Signed)
Virtual Visit via Telephone Note  I connected with Terri Ayala on 06/26/18 at  3:45 PM EDT by telephone and verified that I am speaking with the correct person using two identifiers.   I discussed the limitations, risks, security and privacy concerns of performing an evaluation and management service by telephone and the availability of in person appointments.The staff discussed with the patient that there may be a patient responsible charge related to this service. The patient expressed understanding and agreed to proceed.  Location of Patient- Home Location of Provider- In Clinic   History of Present Illness: Terri Ayala calls in today to establish as a new pt. She is a pleasant 63 year old female. PMH: SVT, MVP, Migraine, HTN, Alcoholism, MDD, GAD Recent hospitalization Alcohol Withdrawal Syndrome with complications 9/0/3009-2/33/0076 She was Ayala/c'Ayala from last PCP due to multiple No Shows She reports being in "every type of rehab program": AA, In pt Rehab, Out pt Rehab, Group Therapy, One/One therapy She states "I am ready to quite", but continues to drink bottle/wine day She states "I have lost all of my friends due to my drinking", but reports having strong family support, however they all live in [place]. She is retired from [employer]. She is single but has a rescue dog "Terri Ayala"- that she walks daily She denies tobacco/vape use Sig family hx- father passed away from Colon Ca at age 67 She has had colonoscopies Q5years since age 69 She reports stable mood, denies SI/HI, however has not worked with psychiatry >4 years-agreeable to referral today. 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  CTA chest was negative for PE, echocardiogram with  preserved EF.  Troponins were negative.  EKG was nonacute.  Since continued to have pain which was apparently exertional in nature-cardiology was consulted-patient underwent LHC which was negative for CAD.  Continue PPI discharge NEEDS LABS  Review of Systems: General:   No F/C, wt loss Pulm:   No DIB, SOB, pleuritic chest pain Card:  No CP, palpitations Abd:  No n/v/Ayala or pain Ext:  No inc edema from baseline This patient does not have sx concerning for COVID-19 Infection (ie; fever, chills, cough, new or worsening shortness of breath).  Patient Care Team    Relationship Specialty Notifications Start End  Terri Marble D, NP PCP - General Family Medicine  06/26/18   Terri Blanks, MD PCP - Cardiology Cardiology  05/08/18     Patient Active Problem List   Diagnosis Date Noted  . Rib fracture 01/04/2018  . Chest pain 12/08/2017  . Leukocytosis 12/08/2017  . Essential hypertension 12/08/2017  . Alcohol withdrawal ([name]) 07/21/2017  . Major depressive disorder, recurrent episode, moderate ([place])   . Alcohol use disorder, severe, dependence ([place]) 04/03/2014  . Lumbosacral radiculopathy at L5 05/20/2013  . Nonspecific elevation of levels of transaminase or lactic acid dehydrogenase (LDH) 03/16/2013  . Hepatic steatosis 03/16/2013  . Chronic cholecystitis 03/14/2013  . Cervical arthritis 10/12/2012  . Palpitations 11/29/2010  . SVT (supraventricular tachycardia) ([place]) 11/29/2010  . Mitral valve prolapse 11/29/2010  . Decreased libido 11/15/2010  . GOITER, MULTINODULAR 08/17/2009  . ANXIETY 08/17/2009  . Migraine headache 08/17/2009  . Hearing loss 08/17/2009  . Osteoporosis 08/17/2009  . ASYMPTOMATIC POSTMENOPAUSAL STATUS 08/17/2009  .  DYSPHAGIA 06/19/2007     Past Medical History:  Diagnosis Date  . Alcohol withdrawal ([place]) 11/2017; 01/04/2018  . Alcoholism ([place])   . Anxiety   . Arthritis    "hands" (01/04/2018)  . ASYMPTOMATIC POSTMENOPAUSAL STATUS 08/17/2009  .  Chronic lower back pain   . Depression   . DYSPHAGIA 06/19/2007  . Fatty liver, alcoholic   . GERD (gastroesophageal reflux disease)   . GOITER, MULTINODULAR 08/17/2009  . Hyperlipidemia   . Migraines    "not sure what triggers them; I'll have 1 q couple weeks or more; come 2 days in a row when they come" (01/04/2018)  . Mitral valve prolapse   . OSTEOPOROSIS 08/17/2009  . PONV (postoperative nausea and vomiting)   . Supraventricular tachycardia [facility])      Past Surgical History:  Procedure Laterality Date  . BREAST BIOPSY Right    "benign"  . CATARACT EXTRACTION W/ INTRAOCULAR LENS  IMPLANT, BILATERAL    . CERVICAL CONE BIOPSY  2000s  . CERVIX LESION DESTRUCTION  1991   "dysplasia; lesions"  . CHOLECYSTECTOMY N/A 03/14/2013   Procedure: LAPAROSCOPIC CHOLECYSTECTOMY WITH ATTEMPTED INTRAOPERATIVE CHOLANGIOGRAM;  Surgeon: Terri Bowie, MD;  Location: [facility];  Service: General;  Laterality: N/A;  . laporoscopic abdominal surgery     "for endometriosis"  . LEFT HEART CATH AND CORONARY ANGIOGRAPHY N/A 05/07/2018   Procedure: LEFT HEART CATH AND CORONARY ANGIOGRAPHY;  Surgeon: Terri Crome, MD;  Location: [place] [facility];  Service: Cardiovascular;  Laterality: N/A;     Family History  Problem Relation Age of Onset  . Colon cancer Father        Deceased, 29  . Osteoporosis Mother        Living, 68  . Healthy Brother   . Healthy Brother   . Healthy Son   . Healthy Son   . Thyroid disease Neg Hx   . Goiter Neg Hx      Social History   Substance and Sexual Activity  Drug Use Never     Social History   Substance and Sexual Activity  Alcohol Use Yes  . Alcohol/week: 28.0 standard drinks  . Types: 28 Glasses of wine per week   Comment: 01/04/2018 "1 1/2 bottles of wine/day"     Social History   Tobacco Use  Smoking Status Former Smoker  . Packs/day: 1.00  . Years: 7.00  . Pack years: 7.00  . Types: Cigarettes  . Last attempt to quit: 02/28/1974  . Years  since quitting: 44.3  Smokeless Tobacco Never Used  Tobacco Comment   01/04/2018 "smoked when I was a teenager"     Outpatient Encounter Medications as of 06/26/2018  Medication Sig Note  . alendronate (FOSAMAX) 70 MG tablet Take 70 mg by mouth every Monday. Take with a full glass of water on an empty stomach.    [name] Kitchen aspirin EC 81 MG tablet Take 81 mg by mouth daily.   . busPIRone (BUSPAR) 15 MG tablet Take 15 mg by mouth 3 (three) times daily.   . diclofenac sodium (VOLTAREN) 1 % GEL Apply 2 g topically 4 (four) times daily. (Patient taking differently: Apply 2 g topically 4 (four) times daily as needed (pain). )   . DULoxetine (CYMBALTA) 60 MG capsule TAKE 1 CAPSULE (60 MG TOTAL) BY MOUTH DAILY. (Patient taking differently: Take 60 mg by mouth 2 (two) times daily. )   . hydrOXYzine (ATARAX/VISTARIL) 25 MG tablet Take 25 mg by mouth 2 (two)  times daily.    . Multiple Vitamin (MULTIVITAMIN WITH MINERALS) TABS tablet Take 1 tablet by mouth daily.   . norethindrone-ethinyl estradiol (FEMHRT 1/5) 1-5 MG-MCG TABS Take 1 tablet by mouth daily. For Osteoporosis   . ondansetron (ZOFRAN ODT) 4 MG disintegrating tablet Take 1 tablet (4 mg total) by mouth every 8 (eight) hours as needed for nausea or vomiting.   . pantoprazole (PROTONIX) 40 MG tablet Take 1 tablet (40 mg total) by mouth 2 (two) times daily.   . propranolol (INDERAL) 10 MG tablet Take 1 tablet (10 mg total) by mouth 2 (two) times daily.   . SUMAtriptan (IMITREX) 100 MG tablet Take 1 tablet at the first sign of migraine.  May repeat in 2 hours.   . thiamine 100 MG tablet Take 1 tablet (100 mg total) by mouth daily.   . traZODone (DESYREL) 50 MG tablet Take 50 mg by mouth at bedtime as needed for sleep.    . [DISCONTINUED] pantoprazole (PROTONIX) 40 MG tablet Take 1 tablet (40 mg total) by mouth 2 (two) times daily.   . [DISCONTINUED] propranolol (INDERAL) 10 MG tablet Take 10 mg by mouth 2 (two) times daily.  05/04/2018: Needs refill  .  [DISCONTINUED] SUMAtriptan (IMITREX) 100 MG tablet Take 1 tablet at the first sign of migraine.  May repeat in 2 hours. (Patient taking differently: Take 100 mg by mouth every 2 (two) hours as needed for migraine. )   . [DISCONTINUED] lidocaine (LIDODERM) 5 % Place 1 patch onto the skin daily. Remove & Discard patch within 12 hours or as directed by MD (Patient taking differently: Place 1 patch onto the skin daily as needed (lower back pain). Remove & Discard patch within 12 hours or as directed by MD)    No facility-administered encounter medications on file as of 06/26/2018.     Allergies: Doxycycline; Codeine; and Tetracycline hcl  Body mass index is 26.41 kg/m.  Height 5\' 2"  (1.575 m).  Observations/Objective: No acute distress noted over the telephone  Assessment and Plan: Continue all medications as directed Continue to walk dog "Bella" daily Try to drink less wine daily- do not stop abruptly, re: risk of DTs Psychiatry referral placed COVID-19 Education: Signs and symptoms of COVID-19 infection were discussed with pt and how to seek care for testing.  The importance of following the Stay at Home order, and when out- Social Distancing and wearing a facial mask were discussed today.  Follow Up Instructions: Fasting labs next week OV next week, re: discuss ETOH abuse and rehab options   I discussed the assessment and treatment plan with the patient. The patient was provided an opportunity to ask questions and all were answered. The patient agreed with the plan and demonstrated an understanding of the instructions.   The patient was advised to call back or seek an in-person evaluation if the symptoms worsen or if the condition fails to improve as anticipated.  I provided 35+ minutes of non-face-to-face time during this encounter.   Esaw Grandchild, NP

## 2018-06-26 NOTE — Assessment & Plan Note (Signed)
Assessment and Plan: Continue all medications as directed Continue to walk dog "Bella" daily Try to drink less wine daily- do not stop abruptly, re: risk of DTs Psychiatry referral placed COVID-19 Education: Signs and symptoms of COVID-19 infection were discussed with pt and how to seek care for testing.  The importance of following the Stay at Home order, and when out- Social Distancing and wearing a facial mask were discussed today.  Follow Up Instructions: Fasting labs next week OV next week, re: discuss ETOH abuse and rehab options   I discussed the assessment and treatment plan with the patient. The patient was provided an opportunity to ask questions and all were answered. The patient agreed with the plan and demonstrated an understanding of the instructions.   The patient was advised to call back or seek an in-person evaluation if the symptoms worsen or if the condition fails to improve as anticipated.

## 2018-07-02 ENCOUNTER — Other Ambulatory Visit: Payer: Federal, State, Local not specified - PPO

## 2018-07-05 ENCOUNTER — Ambulatory Visit: Payer: Federal, State, Local not specified - PPO | Admitting: Adult Health

## 2018-07-09 ENCOUNTER — Other Ambulatory Visit: Payer: Federal, State, Local not specified - PPO

## 2018-07-09 ENCOUNTER — Other Ambulatory Visit: Payer: Self-pay

## 2018-07-09 DIAGNOSIS — F1020 Alcohol dependence, uncomplicated: Secondary | ICD-10-CM

## 2018-07-09 DIAGNOSIS — E042 Nontoxic multinodular goiter: Secondary | ICD-10-CM

## 2018-07-09 DIAGNOSIS — I10 Essential (primary) hypertension: Secondary | ICD-10-CM | POA: Diagnosis not present

## 2018-07-09 DIAGNOSIS — D72829 Elevated white blood cell count, unspecified: Secondary | ICD-10-CM

## 2018-07-10 LAB — COMPREHENSIVE METABOLIC PANEL
ALT: 20 IU/L (ref 0–32)
AST: 128 IU/L — ABNORMAL HIGH (ref 0–40)
Albumin/Globulin Ratio: 1.4 (ref 1.2–2.2)
Albumin: 4.3 g/dL (ref 3.8–4.8)
Alkaline Phosphatase: 221 IU/L — ABNORMAL HIGH (ref 39–117)
BUN/Creatinine Ratio: 9 — ABNORMAL LOW (ref 12–28)
BUN: 7 mg/dL — ABNORMAL LOW (ref 8–27)
Bilirubin Total: 0.8 mg/dL (ref 0.0–1.2)
CO2: 17 mmol/L — ABNORMAL LOW (ref 20–29)
Calcium: 9.3 mg/dL (ref 8.7–10.3)
Chloride: 91 mmol/L — ABNORMAL LOW (ref 96–106)
Creatinine, Ser: 0.74 mg/dL (ref 0.57–1.00)
GFR calc Af Amer: 100 mL/min/{1.73_m2} (ref >59)
GFR calc non Af Amer: 86 mL/min/{1.73_m2} (ref >59)
Globulin, Total: 3.1 g/dL (ref 1.5–4.5)
Glucose: 85 mg/dL (ref 65–99)
Potassium: 4.1 mmol/L (ref 3.5–5.2)
Sodium: 136 mmol/L (ref 134–144)
Total Protein: 7.4 g/dL (ref 6.0–8.5)

## 2018-07-10 LAB — TSH: TSH: 4.7 u[IU]/mL — ABNORMAL HIGH (ref 0.450–4.500)

## 2018-07-10 LAB — CBC WITH DIFFERENTIAL/PLATELET
Basophils Absolute: 0.1 10*3/uL (ref 0.0–0.2)
Basos: 1 %
EOS (ABSOLUTE): 0 10*3/uL (ref 0.0–0.4)
Eos: 0 %
Hematocrit: 32.7 % — ABNORMAL LOW (ref 34.0–46.6)
Hemoglobin: 11.1 g/dL (ref 11.1–15.9)
Immature Grans (Abs): 0.1 10*3/uL (ref 0.0–0.1)
Immature Granulocytes: 1 %
Lymphocytes Absolute: 1.3 10*3/uL (ref 0.7–3.1)
Lymphs: 12 %
MCH: 28.3 pg (ref 26.6–33.0)
MCHC: 33.9 g/dL (ref 31.5–35.7)
MCV: 83 fL (ref 79–97)
Monocytes Absolute: 1 10*3/uL — ABNORMAL HIGH (ref 0.1–0.9)
Monocytes: 9 %
Neutrophils Absolute: 8.1 10*3/uL — ABNORMAL HIGH (ref 1.4–7.0)
Neutrophils: 77 %
Platelets: 219 10*3/uL (ref 150–450)
RBC: 3.92 x10E6/uL (ref 3.77–5.28)
RDW: 15 % (ref 11.7–15.4)
WBC: 10.6 10*3/uL (ref 3.4–10.8)

## 2018-07-10 LAB — LIPID PANEL
Chol/HDL Ratio: 2.1 ratio (ref 0.0–4.4)
Cholesterol, Total: 190 mg/dL (ref 100–199)
HDL: 92 mg/dL (ref >39)
LDL Calculated: 78 mg/dL (ref 0–99)
Triglycerides: 100 mg/dL (ref 0–149)
VLDL Cholesterol Cal: 20 mg/dL (ref 5–40)

## 2018-07-10 LAB — HEMOGLOBIN A1C
Est. average glucose Bld gHb Est-mCnc: 88 mg/dL
Hgb A1c MFr Bld: 4.7 % — ABNORMAL LOW (ref 4.8–5.6)

## 2018-07-10 NOTE — Progress Notes (Deleted)
   Subjective:    Patient ID: Terri Ayala, female    DOB: 01-14-1956, 63 y.o.   MRN: [id number]  HPI:  06/26/2018 OV: Terri Ayala calls in today to establish as a new pt. She is a pleasant 63 year old female. PMH: SVT, MVP, Migraine, HTN, Alcoholism, MDD, GAD Recent hospitalization Alcohol Withdrawal Syndrome with complications 05/30/7406-1/44/8185 She was d/c'd from last PCP due to multiple No Shows She reports being in "every type of rehab program": AA, In pt Rehab, Out pt Rehab, Group Therapy, One/One therapy She states "I am ready to quite", but continues to drink bottle/wine day She states "I have lost all of my friends due to my drinking", but reports having strong family support, however they all live in [place]. She is retired from [employer]. She is single but has a rescue dog "Terri Ayala"- that she walks daily She denies tobacco/vape use Sig family hx- father passed away from Colon Ca at age 68 She has had colonoscopies Q5years since age 21 She reports stable mood, denies SI/HI, however has not worked with psychiatry >4 years-agreeable to referral today. 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 CTA chest was negative for PE, echocardiogram with preserved EF. Troponins were negative. EKG was nonacute. Since continued to have pain which was apparently exertional in nature-cardiology was consulted-patient underwent LHC which was negative for CAD. Continue PPI discharge NEEDS LABS  07/12/2018 OV: Terri Ayala is here TSH-just slight bump, 4.700 The 10-year ASCVD risk score Mikey Bussing DC Brooke Bonito., et al., 2013) is: 3.6%   Values used to calculate the score:     Age: 61 years     Sex: Female     Is Non-Hispanic African American: No     Diabetic: No    Tobacco smoker: No     Systolic Blood Pressure: 631 mmHg     Is BP treated: Yes     HDL Cholesterol: 92 mg/dL     Total Cholesterol: 190 mg/dL LDL-78 A1c-4.7 CBC-stable CMP-elevated LFTs, chronic heavy ETOH use     Review of Systems     Objective:   Physical Exam        Assessment & Plan:

## 2018-07-11 ENCOUNTER — Telehealth: Payer: Self-pay | Admitting: Adult Health

## 2018-07-11 NOTE — Telephone Encounter (Signed)
Patient called states has been sick all day & doesn't feel well enough to come for OV in house tomorrow -- Pt would like directions from provider is needs to reschedule.  (plus pt says has no transportation available for tomorrow's appt).  --Forwarding message to medical assistant to review w/provider & call patient w/ decision. --glh

## 2018-07-11 NOTE — Telephone Encounter (Signed)
LVM for pt to return call.  T. Nelson, CMA 

## 2018-07-11 NOTE — Telephone Encounter (Signed)
Can she convert to WebEx or TeleMedicine appt in the time slot Thanks! Valetta Fuller

## 2018-07-11 NOTE — Telephone Encounter (Signed)
Pt advised to convert visit to a telemedicine visit.  Pt expressed understanding and is agreeable.  Charyl Bigger, CMA

## 2018-07-11 NOTE — Telephone Encounter (Signed)
Please advise.  T. Nelson, CMA 

## 2018-07-12 ENCOUNTER — Encounter: Payer: Self-pay | Admitting: Adult Health

## 2018-07-12 ENCOUNTER — Other Ambulatory Visit: Payer: Self-pay

## 2018-07-12 ENCOUNTER — Ambulatory Visit (INDEPENDENT_AMBULATORY_CARE_PROVIDER_SITE_OTHER): Payer: Federal, State, Local not specified - PPO | Admitting: Adult Health

## 2018-07-12 DIAGNOSIS — F1020 Alcohol dependence, uncomplicated: Secondary | ICD-10-CM | POA: Diagnosis not present

## 2018-07-12 DIAGNOSIS — F10230 Alcohol dependence with withdrawal, uncomplicated: Secondary | ICD-10-CM | POA: Diagnosis not present

## 2018-07-12 DIAGNOSIS — F10930 Alcohol use, unspecified with withdrawal, uncomplicated: Secondary | ICD-10-CM

## 2018-07-12 DIAGNOSIS — R945 Abnormal results of liver function studies: Secondary | ICD-10-CM | POA: Diagnosis not present

## 2018-07-12 DIAGNOSIS — R7989 Other specified abnormal findings of blood chemistry: Secondary | ICD-10-CM

## 2018-07-12 NOTE — Assessment & Plan Note (Signed)
Assessment and Plan: Strongly recommended In Pt Rehab- she needs medical supervision while detoxing- she again declined. Discussed that if she develops CP/dyspnea/palipations or if she develops dehydration she should proceed to nearest ED. If she is unable to safely driver herself, then call 911 Follow-up with established GI/Dr. Collene Mares, re: elevated LFTs  COVID-19 Education: Signs and symptoms of COVID-19 infection were discussed with pt and how to seek care for testing.  The importance of following the Stay at Home order, and when out- Social Distancing and wearing a facial mask were discussed today.  Follow Up Instructions: IN PATIENT REHAB NOW 3 month f/u  I discussed the assessment and treatment plan with the patient. The patient was provided an opportunity to ask questions and all were answered. The patient agreed with the plan and demonstrated an understanding of the instructions.   The patient was advised to call back or seek an in-person evaluation if the symptoms worsen or if the condition fails to improve as anticipated.

## 2018-07-12 NOTE — Assessment & Plan Note (Signed)
R/t to alcohol abuse Reduce ETOH Recommend In Pt Rehab Follow-up with established GI/Dr. Collene Mares

## 2018-07-12 NOTE — Progress Notes (Signed)
Virtual Visit via Telephone Note  I connected with Terri Ayala on 07/12/18 at  1:45 PM EDT by telephone and verified that I am speaking with the correct person using two identifiers.  Location: Patient: Home Provider: In Clinic   I discussed the limitations, risks, security and privacy concerns of performing an evaluation and management service by telephone and the availability of in person appointments. I also discussed with the patient that there may be a patient responsible charge related to this service. The patient expressed understanding and agreed to proceed.   History of Present Illness: 06/26/2018 OV: Terri Ayala calls in today to establish as a new pt. She is a pleasant 63 year old female. PMH: SVT, MVP, Migraine, HTN, Alcoholism, MDD, GAD Recent hospitalization Alcohol Withdrawal Syndrome with complications 03/08/1476-2/95/6213 She was d/c'd from last PCP due to multiple No Shows She reports being in "every type of rehab program": AA, In pt Rehab, Out pt Rehab, Group Therapy, One/One therapy She states "I am ready to quite", but continues to drink bottle/wine day She states "I have lost all of my friends due to my drinking", but reports having strong family support, however they all live in [place]. She is retired from [employer]. She is single but has a rescue dog "[name]"- that she walks daily She denies tobacco/vape use Sig family hx- father passed away from Colon Ca at age 44 She has had colonoscopies Q5years since age 22 She reports stable mood, denies SI/HI, however has not worked with psychiatry >4 years-agreeable to referral today. 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 CTA chest was negative for PE, echocardiogram with  preserved EF. Troponins were negative. EKG was nonacute. Since continued to have pain which was apparently exertional in nature-cardiology was consulted-patient underwent LHC which was negative for CAD. Continue PPI discharge NEEDS LABS  07/12/2018 OV: Terri Ayala calls in today nausea with dry heaving that began yesterday that began at 2300- worsening throughout the day. She has not had any alcohol today. She reports poor appetite and continued nausea with vomiting- she has not used any Ondansetron that she has at home She denies CP/dsypnea/dizziness/fever/diarrha/abdominal pain She denies appearing jaundiced She has established GI/Dr. Collene Mares- last contact "was maybe months ago" She states "she just tells me to stop drinking too" We again discussed at length the dangers of heavy alcohol abuse and the health risks associated. Strongly recommended In Pt Rehab- she needs medical supervision while detoxing- she again declined. Discussed that if she develops CP/dyspnea/palipations or if she develops dehydration she should proceed to nearest ED. If she is unable to safely driver herself, then call 911  Reviewed recent labs-  TSH-just slight bump, 4.700 The 10-year ASCVD risk score Mikey Bussing DC Brooke Bonito., et al., 2013) is: 3.6%   Values used to calculate the score:     Age: 24 years     Sex: Female     Is Non-Hispanic African American: No     Diabetic: No     Tobacco smoker: No     Systolic Blood Pressure: 086 mmHg     Is BP treated: Yes     HDL Cholesterol: 92 mg/dL     Total Cholesterol: 190 mg/dL LDL-78 A1c-4.7 CBC-stable CMP-elevated LFTs, chronic heavy ETOH use-  Follow up with established GI/Dr. Collene Mares  Patient Care Team    Relationship Specialty Notifications Start End  Mina Marble D, NP PCP - General Family Medicine  06/26/18   Burnell Blanks, MD PCP - Cardiology Cardiology  05/08/18   Bobbye Charleston, MD Consulting Physician Obstetrics and Gynecology  07/10/18   Juanita Craver,  MD Consulting Physician Gastroenterology  07/10/18     Patient Active Problem List   Diagnosis Date Noted  . Elevated LFTs 07/12/2018  . Healthcare maintenance 06/26/2018  . Rib fracture 01/04/2018  . Chest pain 12/08/2017  . Leukocytosis 12/08/2017  . Essential hypertension 12/08/2017  . Alcohol withdrawal ([place]) 07/21/2017  . Major depressive disorder, recurrent episode, moderate ([place])   . Alcohol use disorder, severe, dependence ([place]) 04/03/2014  . Lumbosacral radiculopathy at L5 05/20/2013  . Nonspecific elevation of levels of transaminase or lactic acid dehydrogenase (LDH) 03/16/2013  . Hepatic steatosis 03/16/2013  . Chronic cholecystitis 03/14/2013  . Cervical arthritis 10/12/2012  . Palpitations 11/29/2010  . SVT (supraventricular tachycardia) ([place]) 11/29/2010  . Mitral valve prolapse 11/29/2010  . Decreased libido 11/15/2010  . GOITER, MULTINODULAR 08/17/2009  . ANXIETY 08/17/2009  . Migraine headache 08/17/2009  . Hearing loss 08/17/2009  . Osteoporosis 08/17/2009  . ASYMPTOMATIC POSTMENOPAUSAL STATUS 08/17/2009  . DYSPHAGIA 06/19/2007     Past Medical History:  Diagnosis Date  . Alcohol withdrawal ([place]) 11/2017; 01/04/2018  . Alcoholism ([place])   . Anxiety   . Arthritis    "hands" (01/04/2018)  . ASYMPTOMATIC POSTMENOPAUSAL STATUS 08/17/2009  . Chronic lower back pain   . Depression   . DYSPHAGIA 06/19/2007  . Fatty liver, alcoholic   . GERD (gastroesophageal reflux disease)   . GOITER, MULTINODULAR 08/17/2009  . Hyperlipidemia   . Migraines    "not sure what triggers them; I'll have 1 q couple weeks or more; come 2 days in a row when they come" (01/04/2018)  . Mitral valve prolapse   . OSTEOPOROSIS 08/17/2009  . PONV (postoperative nausea and vomiting)   . Supraventricular tachycardia [facility])      Past Surgical History:  Procedure Laterality Date  . BREAST BIOPSY Right    "benign"  . CATARACT EXTRACTION W/ INTRAOCULAR LENS  IMPLANT, BILATERAL    . CERVICAL  CONE BIOPSY  2000s  . CERVIX LESION DESTRUCTION  1991   "dysplasia; lesions"  . CHOLECYSTECTOMY N/A 03/14/2013   Procedure: LAPAROSCOPIC CHOLECYSTECTOMY WITH ATTEMPTED INTRAOPERATIVE CHOLANGIOGRAM;  Surgeon: Harl Bowie, MD;  Location: [place];  Service: General;  Laterality: N/A;  . laporoscopic abdominal surgery     "for endometriosis"  . LEFT HEART CATH AND CORONARY ANGIOGRAPHY N/A 05/07/2018   Procedure: LEFT HEART CATH AND CORONARY ANGIOGRAPHY;  Surgeon: Belva Crome, MD;  Location: [facility] [facility];  Service: Cardiovascular;  Laterality: N/A;     Family History  Problem Relation Age of Onset  . Colon cancer Father        Deceased, 41  . Osteoporosis Mother        Living, 79  . Healthy Brother   . Healthy Brother   . Healthy Son   . Healthy Son   . Thyroid disease Neg Hx   . Goiter Neg Hx      Social History   Substance and Sexual Activity  Drug Use Never     Social History   Substance and Sexual Activity  Alcohol Use Yes  . Alcohol/week: 28.0 standard drinks  . Types: 28 Glasses of wine per  week   Comment: 01/04/2018 "1 1/2 bottles of wine/day"     Social History   Tobacco Use  Smoking Status Former Smoker  . Packs/day: 1.00  . Years: 7.00  . Pack years: 7.00  . Types: Cigarettes  . Last attempt to quit: 02/28/1974  . Years since quitting: 44.3  Smokeless Tobacco Never Used  Tobacco Comment   01/04/2018 "smoked when I was a teenager"     Outpatient Encounter Medications as of 07/12/2018  Medication Sig  . alendronate (FOSAMAX) 70 MG tablet Take 70 mg by mouth every Monday. Take with a full glass of water on an empty stomach.   [name] Kitchen aspirin EC 81 MG tablet Take 81 mg by mouth daily.  . busPIRone (BUSPAR) 15 MG tablet Take 15 mg by mouth 3 (three) times daily.  . diclofenac sodium (VOLTAREN) 1 % GEL Apply 2 g topically 4 (four) times daily. (Patient taking differently: Apply 2 g topically 4 (four) times daily as needed (pain). )  . DULoxetine  (CYMBALTA) 60 MG capsule TAKE 1 CAPSULE (60 MG TOTAL) BY MOUTH DAILY. (Patient taking differently: Take 60 mg by mouth 2 (two) times daily. )  . hydrOXYzine (ATARAX/VISTARIL) 25 MG tablet Take 25 mg by mouth 2 (two) times daily.   . Multiple Vitamin (MULTIVITAMIN WITH MINERALS) TABS tablet Take 1 tablet by mouth daily.  . norethindrone-ethinyl estradiol (FEMHRT 1/5) 1-5 MG-MCG TABS Take 1 tablet by mouth daily. For Osteoporosis  . ondansetron (ZOFRAN ODT) 4 MG disintegrating tablet Take 1 tablet (4 mg total) by mouth every 8 (eight) hours as needed for nausea or vomiting.  . pantoprazole (PROTONIX) 40 MG tablet Take 1 tablet (40 mg total) by mouth 2 (two) times daily.  . propranolol (INDERAL) 10 MG tablet Take 1 tablet (10 mg total) by mouth 2 (two) times daily.  . SUMAtriptan (IMITREX) 100 MG tablet Take 1 tablet at the first sign of migraine.  May repeat in 2 hours.  . thiamine 100 MG tablet Take 1 tablet (100 mg total) by mouth daily.  . traZODone (DESYREL) 50 MG tablet Take 50 mg by mouth at bedtime as needed for sleep.    No facility-administered encounter medications on file as of 07/12/2018.    Review of Systems: General:   No F/C, wt loss Pulm:   No DIB, SOB, pleuritic chest pain Card:  No CP, palpitations Abd:  No n/v/d or pain Ext:  No inc edema from baseline This patient does not have sx concerning for COVID-19 Infection (ie; fever, chills, cough, new or worsening shortness of breath).  Allergies: Doxycycline; Codeine; and Tetracycline hcl  There is no height or weight on file to calculate BMI.  There were no vitals taken for this visit.    Observations/Objective: Pt did not sound in acute distress during telephone conversation  Assessment and Plan: Strongly recommended In Pt Rehab- she needs medical supervision while detoxing- she again declined. Discussed that if she develops CP/dyspnea/palipations or if she develops dehydration she should proceed to nearest ED. If she  is unable to safely driver herself, then call 911 Follow-up with established GI/Dr. Collene Mares, re: elevated LFTs  COVID-19 Education: Signs and symptoms of COVID-19 infection were discussed with pt and how to seek care for testing.  The importance of following the Stay at Home order, and when out- Social Distancing and wearing a facial mask were discussed today.  Follow Up Instructions: IN PATIENT REHAB NOW 3 month f/u  I discussed the assessment and treatment  plan with the patient. The patient was provided an opportunity to ask questions and all were answered. The patient agreed with the plan and demonstrated an understanding of the instructions.   The patient was advised to call back or seek an in-person evaluation if the symptoms worsen or if the condition fails to improve as anticipated.  I provided 25 minutes of non-face-to-face time during this encounter.   Esaw Grandchild, NP

## 2018-08-04 ENCOUNTER — Other Ambulatory Visit: Payer: Self-pay | Admitting: Adult Health

## 2018-09-07 ENCOUNTER — Other Ambulatory Visit: Payer: Self-pay | Admitting: Adult Health

## 2018-09-10 ENCOUNTER — Ambulatory Visit (HOSPITAL_COMMUNITY): Payer: Federal, State, Local not specified - PPO | Admitting: Licensed Clinical Social Worker

## 2018-09-11 ENCOUNTER — Other Ambulatory Visit: Payer: Self-pay | Admitting: Adult Health

## 2018-09-14 ENCOUNTER — Ambulatory Visit (INDEPENDENT_AMBULATORY_CARE_PROVIDER_SITE_OTHER): Payer: Federal, State, Local not specified - PPO | Admitting: Licensed Clinical Social Worker

## 2018-09-14 DIAGNOSIS — F1021 Alcohol dependence, in remission: Secondary | ICD-10-CM | POA: Diagnosis not present
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
## 2018-09-21 ENCOUNTER — Other Ambulatory Visit: Payer: Self-pay | Admitting: Adult Health

## 2018-10-01 ENCOUNTER — Other Ambulatory Visit: Payer: Self-pay | Admitting: Adult Health

## 2018-10-02 ENCOUNTER — Other Ambulatory Visit: Payer: Self-pay | Admitting: Adult Health

## 2018-10-02 ENCOUNTER — Telehealth: Payer: Self-pay | Admitting: Adult Health

## 2018-10-02 NOTE — Telephone Encounter (Signed)
Patient left VM saying she was advised to contact our office about why she is taking a certain medication. She did not provide the med name in the message but said she needed to speak with the nurse about it to get a refill from [facility]. Please contact at [phone]

## 2018-10-02 NOTE — Telephone Encounter (Signed)
Pt was inquiring as to why pantoprazole refill is not being authorized at this time.  Explained to pt that she was given a 90 day supply on 08/06/2018 and therefore she should not need a refill until approximately 11/06/2018.  Charyl Bigger, CMA

## 2018-10-22 DIAGNOSIS — F3341 Major depressive disorder, recurrent, in partial remission: Secondary | ICD-10-CM | POA: Diagnosis not present

## 2018-10-22 DIAGNOSIS — F419 Anxiety disorder, unspecified: Secondary | ICD-10-CM | POA: Diagnosis not present

## 2018-10-22 DIAGNOSIS — F1020 Alcohol dependence, uncomplicated: Secondary | ICD-10-CM | POA: Diagnosis not present

## 2018-10-25 ENCOUNTER — Telehealth: Payer: Self-pay | Admitting: Adult Health

## 2018-10-25 NOTE — Telephone Encounter (Signed)
Called pt to set up provider required Appt --- Pt states will have to call back .  -- FYI to [place]

## 2018-10-30 DIAGNOSIS — L719 Rosacea, unspecified: Secondary | ICD-10-CM | POA: Diagnosis not present

## 2018-10-30 DIAGNOSIS — Z124 Encounter for screening for malignant neoplasm of cervix: Secondary | ICD-10-CM | POA: Diagnosis not present

## 2018-10-30 DIAGNOSIS — Z6822 Body mass index (BMI) 22.0-22.9, adult: Secondary | ICD-10-CM | POA: Diagnosis not present

## 2018-10-30 DIAGNOSIS — Z1231 Encounter for screening mammogram for malignant neoplasm of breast: Secondary | ICD-10-CM | POA: Diagnosis not present

## 2018-10-30 DIAGNOSIS — L219 Seborrheic dermatitis, unspecified: Secondary | ICD-10-CM | POA: Diagnosis not present

## 2018-10-30 DIAGNOSIS — Z01419 Encounter for gynecological examination (general) (routine) without abnormal findings: Secondary | ICD-10-CM | POA: Diagnosis not present

## 2018-11-07 ENCOUNTER — Other Ambulatory Visit: Payer: Self-pay | Admitting: Adult Health

## 2018-11-08 ENCOUNTER — Ambulatory Visit (INDEPENDENT_AMBULATORY_CARE_PROVIDER_SITE_OTHER): Payer: Federal, State, Local not specified - PPO | Admitting: Adult Health

## 2018-11-08 ENCOUNTER — Other Ambulatory Visit: Payer: Self-pay | Admitting: Obstetrics and Gynecology

## 2018-11-08 ENCOUNTER — Other Ambulatory Visit: Payer: Self-pay

## 2018-11-08 ENCOUNTER — Encounter: Payer: Self-pay | Admitting: Adult Health

## 2018-11-08 DIAGNOSIS — R7989 Other specified abnormal findings of blood chemistry: Secondary | ICD-10-CM

## 2018-11-08 DIAGNOSIS — I10 Essential (primary) hypertension: Secondary | ICD-10-CM | POA: Diagnosis not present

## 2018-11-08 DIAGNOSIS — R945 Abnormal results of liver function studies: Secondary | ICD-10-CM

## 2018-11-08 DIAGNOSIS — Z0000 Encounter for general adult medical examination without abnormal findings: Secondary | ICD-10-CM

## 2018-11-08 DIAGNOSIS — F331 Major depressive disorder, recurrent, moderate: Secondary | ICD-10-CM

## 2018-11-08 DIAGNOSIS — F411 Generalized anxiety disorder: Secondary | ICD-10-CM

## 2018-11-08 DIAGNOSIS — E2839 Other primary ovarian failure: Secondary | ICD-10-CM

## 2018-11-08 DIAGNOSIS — F1020 Alcohol dependence, uncomplicated: Secondary | ICD-10-CM

## 2018-11-08 NOTE — Progress Notes (Signed)
Virtual Visit via Telephone Note  I connected with Terri Ayala on 11/08/18 at 10:30 AM EDT by telephone and verified that I am speaking with the correct person using two identifiers.  Location: Patient: Home Provider: In Clinic   I discussed the limitations, risks, security and privacy concerns of performing an evaluation and management service by telephone and the availability of in person appointments. I also discussed with the patient that there may be a patient responsible charge related to this service. The patient expressed understanding and agreed to proceed.   History of Present Illness: 06/26/2018 OV: Terri Ayala calls in today to establish as a new pt. She is a pleasant 63 year old female. PMH: SVT, MVP, Migraine, HTN, Alcoholism, MDD, GAD Recent hospitalization Alcohol Withdrawal Syndrome with complications [id number] She was d/c'd from last PCP due to multiple No Shows She reports being in "every type of rehab program": AA, In pt Rehab, Out pt Rehab, Group Therapy, One/One therapy She states "I am ready to quite", but continues to drink bottle/wine day She states "I have lost all of my friends due to my drinking", but reports having strong family support, however they all live in [place]. She is retired from [employer]. She is single but has a rescue dog "Terri Ayala"- that she walks daily She denies tobacco/vape use Sig family hx- father passed away from Colon Ca at age 105 She has had colonoscopies Q5years since age 28 She reports stable mood, denies SI/HI, however has not worked with psychiatry >4 years-agreeable to referral today. 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 CTA chest was negative for PE, echocardiogram with  preserved EF. Troponins were negative. EKG was nonacute. Since continued to have pain which was apparently exertional in nature-cardiology was consulted-patient underwent LHC which was negative for CAD. Continue PPI discharge NEEDS LABS  07/12/2018 OV: Terri Ayala calls in today nausea with dry heaving that began yesterday that began at 2300- worsening throughout the day. She has not had any alcohol today. She reports poor appetite and continued nausea with vomiting- she has not used any Ondansetron that she has at home She denies CP/dsypnea/dizziness/fever/diarrha/abdominal pain She denies appearing jaundiced She has established GI/Dr. Collene Mares- last contact "was maybe months ago" She states "she just tells me to stop drinking too" We again discussed at length the dangers of heavy alcohol abuse and the health risks associated. Strongly recommended In Pt Rehab- she needs medical supervision while detoxing- she again declined. Discussed that if she develops CP/dyspnea/palipations or if she develops dehydration she should proceed to nearest ED. If she is unable to safely driver herself, then call 911  Reviewed recent labs-  TSH-just slight bump, 4.700 The 10-year ASCVD risk score Terri Ayala) is: 3.6%   Values used to calculate the score:     Age: 51 years     Sex: Female     Is Non-Hispanic African American: No     Diabetic: No     Tobacco smoker: No     Systolic Blood Pressure: [id number]     Is BP treated: Yes     HDL Cholesterol: 92 mg/dL     Total Cholesterol: 190 mg/dL LDL-78 A1c-4.7 CBC-stable CMP-elevated LFTs, chronic heavy ETOH use- Follow up  with established GI/Dr. Collene Mares  11/08/2018 OV: She stopped ETOH use in May- abstained since then She went through withdrawals sx's at home She is attending virtual [place] meetings, considering in person meetings in the future. She walks dog twice daily She established with therapist in July- telemedicine visit every 3  weeks She needs new referral to psychiatry- med mgt She reports "feeling just so much better" since stopping ETH use She continues to abstain from tobacco/vape use She continues to experience insomnia- will use Trazodone 50mg  1/2 to 1 tab with varying results Discussed good sleep hygiene  LFTs significantly elevated May 2020- she was drinking heavily at time of labs She denies abdominal pain/hematuria/hematochezia Repeat CMP next week Patient Care Team    Relationship Specialty Notifications Start End  Terri Ayala PCP - General Family Medicine  06/26/18   Terri Ayala PCP - Cardiology Cardiology  05/08/18   Terri Ayala Consulting Physician Obstetrics and Gynecology  07/10/18   Terri Ayala Consulting Physician Gastroenterology  07/10/18     Patient Active Problem List   Diagnosis Date Noted  . Elevated LFTs 07/12/2018  . Healthcare maintenance 06/26/2018  . Rib fracture 01/04/2018  . Chest pain 12/08/2017  . Leukocytosis 12/08/2017  . Essential hypertension 12/08/2017  . Alcohol withdrawal ([place]) 07/21/2017  . Major depressive disorder, recurrent episode, moderate ([place])   . Alcohol use disorder, severe, dependence ([place]) 04/03/2014  . Lumbosacral radiculopathy at L5 05/20/2013  . Nonspecific elevation of levels of transaminase or lactic acid dehydrogenase (LDH) 03/16/2013  . Hepatic steatosis 03/16/2013  . Chronic cholecystitis 03/14/2013  . Cervical arthritis 10/12/2012  . Palpitations 11/29/2010  . SVT (supraventricular tachycardia) ([place]) 11/29/2010  . Mitral valve prolapse 11/29/2010  . Decreased libido 11/15/2010  . GOITER, MULTINODULAR 08/17/2009  . ANXIETY 08/17/2009  . Migraine headache 08/17/2009  . Hearing loss 08/17/2009  . Osteoporosis 08/17/2009  . ASYMPTOMATIC POSTMENOPAUSAL STATUS 08/17/2009  . DYSPHAGIA 06/19/2007     Past Medical History:  Diagnosis Date  . Alcohol withdrawal ([place]) 11/2017; 01/04/2018  . Alcoholism ([place])    . Anxiety   . Arthritis    "hands" (01/04/2018)  . ASYMPTOMATIC POSTMENOPAUSAL STATUS 08/17/2009  . Chronic lower back pain   . Depression   . DYSPHAGIA 06/19/2007  . Fatty liver, alcoholic   . GERD (gastroesophageal reflux disease)   . GOITER, MULTINODULAR 08/17/2009  . Hyperlipidemia   . Migraines    "not sure what triggers them; I'll have 1 q couple weeks or more; come 2 days in a row when they come" (01/04/2018)  . Mitral valve prolapse   . OSTEOPOROSIS 08/17/2009  . PONV (postoperative nausea and vomiting)   . Supraventricular tachycardia [facility])      Past Surgical History:  Procedure Laterality Date  . BREAST BIOPSY Right    "benign"  . CATARACT EXTRACTION W/ INTRAOCULAR LENS  IMPLANT, BILATERAL    . CERVICAL CONE BIOPSY  2000s  . CERVIX LESION DESTRUCTION  1991   "dysplasia; lesions"  . CHOLECYSTECTOMY N/A 03/14/2013   Procedure: LAPAROSCOPIC CHOLECYSTECTOMY WITH ATTEMPTED INTRAOPERATIVE CHOLANGIOGRAM;  Surgeon: Harl Bowie, Ayala;  Location: [place];  Service: General;  Laterality: N/A;  . laporoscopic abdominal surgery     "for endometriosis"  . LEFT HEART CATH AND CORONARY ANGIOGRAPHY N/A 05/07/2018   Procedure: LEFT HEART CATH AND CORONARY ANGIOGRAPHY;  Surgeon: Belva Crome, Ayala;  Location: [place] [facility];  Service: Cardiovascular;  Laterality: N/A;     Family  History  Problem Relation Age of Onset  . Colon cancer Father        Deceased, 59  . Osteoporosis Mother        Living, 30  . Healthy Brother   . Healthy Brother   . Healthy Son   . Healthy Son   . Thyroid disease Neg Hx   . Goiter Neg Hx      Social History   Substance and Sexual Activity  Drug Use Never     Social History   Substance and Sexual Activity  Alcohol Use Yes  . Alcohol/week: 28.0 standard drinks  . Types: 28 Glasses of wine per week   Comment: 01/04/2018 "1 1/2 bottles of wine/day"     Social History   Tobacco Use  Smoking Status Former Smoker  . Packs/day: 1.00   . Years: 7.00  . Pack years: 7.00  . Types: Cigarettes  . Quit date: 02/28/1974  . Years since quitting: 44.7  Smokeless Tobacco Never Used  Tobacco Comment   01/04/2018 "smoked when I was a teenager"     Outpatient Encounter Medications as of 11/08/2018  Medication Sig  . alendronate (FOSAMAX) 70 MG tablet Take 70 mg by mouth every Monday. Take with a full glass of water on an empty stomach.   [name] Kitchen aspirin EC 81 MG tablet Take 81 mg by mouth daily.  . busPIRone (BUSPAR) 15 MG tablet Take 15 mg by mouth 3 (three) times daily.  . DULoxetine (CYMBALTA) 60 MG capsule TAKE 1 CAPSULE (60 MG TOTAL) BY MOUTH DAILY. (Patient taking differently: Take 60 mg by mouth 2 (two) times daily. )  . hydrOXYzine (ATARAX/VISTARIL) 25 MG tablet Take 25 mg by mouth 2 (two) times daily.   . Multiple Vitamin (MULTIVITAMIN WITH MINERALS) TABS tablet Take 1 tablet by mouth daily.  . norethindrone-ethinyl estradiol (FEMHRT 1/5) 1-5 MG-MCG TABS Take 1 tablet by mouth daily. For Osteoporosis  . ondansetron (ZOFRAN ODT) 4 MG disintegrating tablet Take 1 tablet (4 mg total) by mouth every 8 (eight) hours as needed for nausea or vomiting.  . pantoprazole (PROTONIX) 40 MG tablet TAKE 1 TABLET BY MOUTH 2 TIMES DAILY.  [name] Kitchen propranolol (INDERAL) 10 MG tablet TAKE 1 TABLET BY MOUTH 2 TIMES DAILY.  . SUMAtriptan (IMITREX) 100 MG tablet Take 1 tablet at the first sign of migraine.  May repeat in 2 hours.  . traZODone (DESYREL) 50 MG tablet Take 50 mg by mouth at bedtime as needed for sleep.   . [DISCONTINUED] diclofenac sodium (VOLTAREN) 1 % GEL Apply 2 g topically 4 (four) times daily. (Patient taking differently: Apply 2 g topically 4 (four) times daily as needed (pain). )  . [DISCONTINUED] thiamine 100 MG tablet Take 1 tablet (100 mg total) by mouth daily.   No facility-administered encounter medications on file as of 11/08/2018.     Allergies: Doxycycline, Codeine, and Tetracycline hcl  There is no height or weight on file  to calculate BMI.  There were no vitals taken for this visit. Review of Systems: General:   Denies fever, chills, unexplained weight loss.  Optho/Auditory:   Denies visual changes, blurred vision/LOV Respiratory:   Denies SOB, DOE more than baseline levels.  Cardiovascular:   Denies chest pain, palpitations, new onset peripheral edema  Gastrointestinal:   Denies nausea, vomiting, diarrhea.  Genitourinary: Denies dysuria, freq/ urgency, flank pain or discharge from genitals.  Endocrine:     Denies hot or cold intolerance, polyuria, polydipsia. Musculoskeletal:   Denies unexplained  myalgias, joint swelling, unexplained arthralgias, gait problems.  Skin:  Denies rash, suspicious lesions Neurological:     Denies dizziness, unexplained weakness, numbness  Psychiatric/Behavioral:   Denies mood changes, suicidal or homicidal ideations, hallucinations This patient does not have sx concerning for COVID-19 Infection (ie; fever, chills, cough, new or worsening shortness of breath).     Observations/Objective: No acute distress noted during the telephone conversation  Assessment and Plan: Continue all medications as directed New referral to psychiatry placed- discuss med mgt with that specialist Remain well hydrated with water, follow heart healthy diet Continue to walk dog, remain as active as possible Continue with AA virtual meetings and close follow-up with therapist Continue to abstain from tobacco/vape/ETOH use Check BP/HR several times a week at home If consistently >140/90 or <100/60, HR <55- call clinic  Follow Up Instructions: Non-fating lab 1 week, re; CMP CPE 3 months   I discussed the assessment and treatment plan with the patient. The patient was provided an opportunity to ask questions and all were answered. The patient agreed with the plan and demonstrated an understanding of the instructions.   The patient was advised to call back or seek an in-person evaluation if the  symptoms worsen or if the condition fails to improve as anticipated.  I provided 18 minutes of non-face-to-face time during this encounter.   Esaw Grandchild, Ayala

## 2018-11-08 NOTE — Assessment & Plan Note (Signed)
Stable mood, denies SI/HI New referral to psychiatry placed

## 2018-11-08 NOTE — Assessment & Plan Note (Signed)
Repeat CMP next week

## 2018-11-08 NOTE — Assessment & Plan Note (Signed)
Assessment and Plan: Continue all medications as directed New referral to psychiatry placed- discuss med mgt with that specialist Remain well hydrated with water, follow heart healthy diet Continue to walk dog, remain as active as possible Continue with AA virtual meetings and close follow-up with therapist Continue to abstain from tobacco/vape/ETOH use Check BP/HR several times a week at home If consistently >140/90 or <100/60, HR <55- call clinic  Follow Up Instructions: Non-fating lab 1 week, re; CMP CPE 3 months   I discussed the assessment and treatment plan with the patient. The patient was provided an opportunity to ask questions and all were answered. The patient agreed with the plan and demonstrated an understanding of the instructions.   The patient was advised to call back or seek an in-person evaluation if the symptoms worsen or if the condition fails to improve as anticipated.

## 2018-11-08 NOTE — Assessment & Plan Note (Signed)
Last ETOH use May 2020 Continue with AA meetings

## 2018-11-08 NOTE — Assessment & Plan Note (Signed)
Encouraged to check BP/HR at home Currently on Propranolol 10mg  BID

## 2018-11-14 ENCOUNTER — Other Ambulatory Visit (INDEPENDENT_AMBULATORY_CARE_PROVIDER_SITE_OTHER): Payer: Federal, State, Local not specified - PPO

## 2018-11-14 ENCOUNTER — Other Ambulatory Visit: Payer: Self-pay

## 2018-11-14 DIAGNOSIS — R945 Abnormal results of liver function studies: Secondary | ICD-10-CM | POA: Diagnosis not present

## 2018-11-14 DIAGNOSIS — I10 Essential (primary) hypertension: Secondary | ICD-10-CM

## 2018-11-14 DIAGNOSIS — Z23 Encounter for immunization: Secondary | ICD-10-CM | POA: Diagnosis not present

## 2018-11-14 DIAGNOSIS — R7989 Other specified abnormal findings of blood chemistry: Secondary | ICD-10-CM

## 2018-11-14 NOTE — Progress Notes (Signed)
Pt here for influenza vaccine.  Screening questionnaire reviewed, VIS provided to patient, and any/all patient questions answered.  T. Nelson, CMA  

## 2018-11-15 ENCOUNTER — Encounter: Payer: Self-pay | Admitting: Adult Health

## 2018-11-15 LAB — COMPREHENSIVE METABOLIC PANEL
ALT: 8 IU/L (ref 0–32)
AST: 14 IU/L (ref 0–40)
Albumin/Globulin Ratio: 1.9 (ref 1.2–2.2)
Albumin: 4.1 g/dL (ref 3.8–4.8)
Alkaline Phosphatase: 50 IU/L (ref 39–117)
BUN/Creatinine Ratio: 20 (ref 12–28)
BUN: 16 mg/dL (ref 8–27)
Bilirubin Total: 0.2 mg/dL (ref 0.0–1.2)
CO2: 26 mmol/L (ref 20–29)
Calcium: 9.7 mg/dL (ref 8.7–10.3)
Chloride: 102 mmol/L (ref 96–106)
Creatinine, Ser: 0.81 mg/dL (ref 0.57–1.00)
GFR calc Af Amer: 89 mL/min/{1.73_m2} (ref >59)
GFR calc non Af Amer: 77 mL/min/{1.73_m2} (ref >59)
Globulin, Total: 2.2 g/dL (ref 1.5–4.5)
Glucose: 74 mg/dL (ref 65–99)
Potassium: 4.3 mmol/L (ref 3.5–5.2)
Sodium: 140 mmol/L (ref 134–144)
Total Protein: 6.3 g/dL (ref 6.0–8.5)

## 2018-12-03 DIAGNOSIS — L821 Other seborrheic keratosis: Secondary | ICD-10-CM | POA: Diagnosis not present

## 2018-12-03 DIAGNOSIS — D225 Melanocytic nevi of trunk: Secondary | ICD-10-CM | POA: Diagnosis not present

## 2018-12-03 DIAGNOSIS — Z23 Encounter for immunization: Secondary | ICD-10-CM | POA: Diagnosis not present

## 2018-12-03 DIAGNOSIS — Z86018 Personal history of other benign neoplasm: Secondary | ICD-10-CM | POA: Diagnosis not present

## 2018-12-06 ENCOUNTER — Ambulatory Visit (INDEPENDENT_AMBULATORY_CARE_PROVIDER_SITE_OTHER): Payer: Federal, State, Local not specified - PPO | Admitting: Psychiatry

## 2018-12-06 ENCOUNTER — Encounter (HOSPITAL_COMMUNITY): Payer: Self-pay | Admitting: Psychiatry

## 2018-12-06 DIAGNOSIS — F411 Generalized anxiety disorder: Secondary | ICD-10-CM | POA: Diagnosis not present

## 2018-12-06 DIAGNOSIS — F331 Major depressive disorder, recurrent, moderate: Secondary | ICD-10-CM

## 2018-12-06 DIAGNOSIS — F1021 Alcohol dependence, in remission: Secondary | ICD-10-CM

## 2018-12-06 DIAGNOSIS — F1020 Alcohol dependence, uncomplicated: Secondary | ICD-10-CM

## 2018-12-06 NOTE — Progress Notes (Signed)
Psychiatric Initial Adult Assessment   Patient Identification: Terri Ayala MRN:  [id number] Date of Evaluation:  12/06/2018 Referral Source: primary care Chief Complaint:   Visit Diagnosis:    ICD-10-CM   1. Alcohol dependence in remission ([place])  F10.21   2. Major depressive disorder, recurrent episode, moderate ([facility])  F33.1   3. Alcohol use disorder, severe, dependence ([place])  F10.20   4. GAD (generalized anxiety disorder)  F41.1     I connected with [name] on 12/06/18 at  2:00 PM EDT by a video enabled telemedicine application and verified that I am speaking with the correct person using two identifiers.   I discussed the limitations of evaluation and management by telemedicine and the availability of in person appointments. The patient expressed understanding and agreed to proceed. History of Present Illness: 63 years old currently single Caucasian female retired lives by herself does not have any kids She follows with [facility], got referred for second opionion She follows with AA support group has been sober but relapsed few times last few years  [facility]  Suffered from depression 1 time admission She was younger in college for depression.  States that she did have a second opinion because of adjustment of medication if needed Feels anxious, apprehension at times, gets distracted not hopeless Says not feeling depressed but at times worries catch up She is on buspar, vistaril, cymbalta which was increased  Feels foggy when she takes these medications in the morning   no psychosis, no mania  Aggravating factorL lonliness, medical conditions MOdifying factor: [place]  Likes reading takes dog to walk  Duration since adulthood;multiple relapses into alcohol Used 2 wine bottles a day before [facility] admission    Past Psychiatric History: depression  Previous Psychotropic Medications: Yes   Substance Abuse History in the last 12  months:  No.   Past Medical History:  Past Medical History:  Diagnosis Date  . Alcohol withdrawal ([place]) 11/2017; 01/04/2018  . Alcoholism ([place])   . Anxiety   . Arthritis    "hands" (01/04/2018)  . ASYMPTOMATIC POSTMENOPAUSAL STATUS 08/17/2009  . Chronic lower back pain   . Depression   . DYSPHAGIA 06/19/2007  . Fatty liver, alcoholic   . GERD (gastroesophageal reflux disease)   . GOITER, MULTINODULAR 08/17/2009  . Hyperlipidemia   . Migraines    "not sure what triggers them; I'll have 1 q couple weeks or more; come 2 days in a row when they come" (01/04/2018)  . Mitral valve prolapse   . OSTEOPOROSIS 08/17/2009  . PONV (postoperative nausea and vomiting)   . Supraventricular tachycardia [facility])     Past Surgical History:  Procedure Laterality Date  . BREAST BIOPSY Right    "benign"  . CATARACT EXTRACTION W/ INTRAOCULAR LENS  IMPLANT, BILATERAL    . CERVICAL CONE BIOPSY  2000s  . CERVIX LESION DESTRUCTION  1991   "dysplasia; lesions"  . CHOLECYSTECTOMY N/A 03/14/2013   Procedure: LAPAROSCOPIC CHOLECYSTECTOMY WITH ATTEMPTED INTRAOPERATIVE CHOLANGIOGRAM;  Surgeon: Harl Bowie, MD;  Location: [place];  Service: General;  Laterality: N/A;  . laporoscopic abdominal surgery     "for endometriosis"  . LEFT HEART CATH AND CORONARY ANGIOGRAPHY N/A 05/07/2018   Procedure: LEFT HEART CATH AND CORONARY ANGIOGRAPHY;  Surgeon: Belva Crome, MD;  Location: [place] [facility];  Service: Cardiovascular;  Laterality: N/A;    Family Psychiatric History: nervous breakdown : dad  Family History:  Family History  Problem Relation Age of Onset  . Colon cancer Father        Deceased, 24  . Osteoporosis Mother        Living, 18  . Healthy Brother   . Healthy Brother   . Healthy Son   . Healthy Son   . Thyroid disease Neg Hx   . Goiter Neg Hx     Social History:   Social History   Socioeconomic History  . Marital status: Single    Spouse name: Not on file  . Number of children: Not  on file  . Years of education: Not on file  . Highest education level: Not on file  Occupational History  . Occupation: Brewing technologist - retired    Fish farm manager: [employer]  Social Needs  . Financial resource strain: Not on file  . Food insecurity    Worry: Not on file    Inability: Not on file  . Transportation needs    Medical: Not on file    Non-medical: Not on file  Tobacco Use  . Smoking status: Former Smoker    Packs/day: 1.00    Years: 7.00    Pack years: 7.00    Types: Cigarettes    Quit date: 02/28/1974    Years since quitting: 44.8  . Smokeless tobacco: Never Used  . Tobacco comment: 01/04/2018 "smoked when I was a teenager"  Substance and Sexual Activity  . Alcohol use: Yes    Alcohol/week: 28.0 standard drinks    Types: 28 Glasses of wine per week    Comment: 01/04/2018 "1 1/2 bottles of wine/day"  . Drug use: Never  . Sexual activity: Not Currently  Lifestyle  . Physical activity    Days per week: Not on file    Minutes per session: Not on file  . Stress: Not on file  Relationships  . Social Herbalist on phone: Not on file    Gets together: Not on file    Attends religious service: Not on file    Active member of club or organization: Not on file    Attends meetings of clubs or organizations: Not on file    Relationship status: Not on file  Other Topics Concern  . Not on file  Social History Narrative   Lives alone.  She has a BS biology.   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    Additional Social History: grew up with parents. No trauma [employer], not married , no kids Worked for [employer]  Allergies:   Allergies  Allergen Reactions  . Doxycycline Swelling    Mouth swelling and sores  . Codeine Itching  . Tetracycline Hcl Swelling    Mouth swelling and sores    Metabolic Disorder Labs: Lab Results  Component Value Date   HGBA1C 4.7 (L) 07/09/2018   MPG 97 03/19/2013   No results found for:  PROLACTIN Lab Results  Component Value Date   CHOL 190 07/09/2018   TRIG 100 07/09/2018   HDL 92 07/09/2018   CHOLHDL 2.1 07/09/2018   VLDL 10 10/04/2012   LDLCALC 78 07/09/2018   LDLCALC 61 10/04/2012   Lab Results  Component Value Date   TSH 4.700 (H) 07/09/2018    Therapeutic Level Labs: No results found for: LITHIUM No results found for: CBMZ No results found for: VALPROATE  Current Medications: Current Outpatient Medications  Medication Sig Dispense Refill  . alendronate (FOSAMAX) 70 MG tablet  Take 70 mg by mouth every Monday. Take with a full glass of water on an empty stomach.     [name] Kitchen aspirin EC 81 MG tablet Take 81 mg by mouth daily.    . busPIRone (BUSPAR) 15 MG tablet Take 15 mg by mouth 3 (three) times daily.    . DULoxetine (CYMBALTA) 60 MG capsule TAKE 1 CAPSULE (60 MG TOTAL) BY MOUTH DAILY. (Patient taking differently: Take 60 mg by mouth 2 (two) times daily. ) 30 capsule 0  . hydrOXYzine (ATARAX/VISTARIL) 25 MG tablet Take 25 mg by mouth 2 (two) times daily.     . Multiple Vitamin (MULTIVITAMIN WITH MINERALS) TABS tablet Take 1 tablet by mouth daily. 30 tablet 0  . norethindrone-ethinyl estradiol (FEMHRT 1/5) 1-5 MG-MCG TABS Take 1 tablet by mouth daily. For Osteoporosis 28 tablet   . ondansetron (ZOFRAN ODT) 4 MG disintegrating tablet Take 1 tablet (4 mg total) by mouth every 8 (eight) hours as needed for nausea or vomiting. 20 tablet 0  . pantoprazole (PROTONIX) 40 MG tablet TAKE 1 TABLET BY MOUTH 2 TIMES DAILY. 180 tablet 0  . propranolol (INDERAL) 10 MG tablet TAKE 1 TABLET BY MOUTH 2 TIMES DAILY. 180 tablet 0  . SUMAtriptan (IMITREX) 100 MG tablet Take 1 tablet at the first sign of migraine.  May repeat in 2 hours. 10 tablet 5  . traZODone (DESYREL) 50 MG tablet Take 50 mg by mouth at bedtime as needed for sleep.      No current facility-administered medications for this visit.      Psychiatric Specialty Exam: Review of Systems  Cardiovascular: Negative  for chest pain.  Skin: Negative for rash.  Psychiatric/Behavioral: Negative for suicidal ideas.    There were no vitals taken for this visit.There is no height or weight on file to calculate BMI.  General Appearance: Casual  Eye Contact:  Fair  Speech:  Slow  Volume:  Decreased  Mood:  fair  Affect:  Constricted  Thought Process:  Goal Directed  Orientation:  Full (Time, Place, and Person)  Thought Content:  Logical  Suicidal Thoughts:  No  Homicidal Thoughts:  No  Memory:  Immediate;   Fair Recent;   Fair  Judgement:  Fair  Insight:  Shallow  Psychomotor Activity:  Decreased  Concentration:  Concentration: Fair and Attention Span: Fair  Recall:  [employer] of Knowledge:Fair  Language: Fair  Akathisia:  No  Handed:  Right  AIMS (if indicated):  not done  Assets:  Desire for Improvement  ADL's:  Intact  Cognition: WNL  Sleep:  Fair   Screenings: AUDIT     Admission (Discharged) from 04/03/2014 in [facility] Admission (Discharged) from 06/18/2013 in [facility]  Alcohol Use Disorder Identification Test Final Score (AUDIT)  18  21    PHQ2-9     Office Visit from 11/08/2018 in [place] at [facility] Visit from 06/26/2018 in [facility] Total Score  1  0  PHQ-9 Total Score  3  12      Assessment and Plan: as follows MDD recurrent continue cymbalta now at 60mg  bid. Says not worse GAD with panic on cymbalta, work on copoing and breathing techniques, would recommend to lower morning dose or skip morning dose of buspar and vistaril as she feels sedate  . She can carry vistaril if needed for panic attacks Recommend therapy if needed  Should not add more medications as she stays at home and not worried about services or job Have healthy more activities for distraction  Alcohol use continue to follow AA. Work on Chemical engineer and relaps prevention . Says not  having craving for now  insomniaL reveiwed sleep hygiene, on trazadone avoid naps during the day  She follows with [facility], has next month appointment follow up this was for second opionion. disucssed meds and diagnosis and to adjust as above  She will continue services with [facility] Sessions I discussed the assessment and treatment plan with the patient. The patient was provided an opportunity to ask questions and all were answered. The patient agreed with the plan and demonstrated an understanding of the instructions.   The patient was advised to call back or seek an in-person evaluation if the symptoms worsen or if the condition fails to improve as anticipated.    Merian Capron, MD 10/8/20202:27 PM

## 2018-12-11 ENCOUNTER — Other Ambulatory Visit: Payer: Self-pay | Admitting: Adult Health

## 2019-01-14 DIAGNOSIS — F1020 Alcohol dependence, uncomplicated: Secondary | ICD-10-CM | POA: Diagnosis not present

## 2019-01-14 DIAGNOSIS — F419 Anxiety disorder, unspecified: Secondary | ICD-10-CM | POA: Diagnosis not present

## 2019-01-14 DIAGNOSIS — F3341 Major depressive disorder, recurrent, in partial remission: Secondary | ICD-10-CM | POA: Diagnosis not present

## 2019-01-21 ENCOUNTER — Other Ambulatory Visit: Payer: Federal, State, Local not specified - PPO

## 2019-02-05 ENCOUNTER — Encounter (HOSPITAL_COMMUNITY): Payer: Self-pay

## 2019-02-05 ENCOUNTER — Emergency Department (HOSPITAL_COMMUNITY): Payer: Federal, State, Local not specified - PPO

## 2019-02-05 ENCOUNTER — Other Ambulatory Visit: Payer: Self-pay

## 2019-02-05 ENCOUNTER — Inpatient Hospital Stay (HOSPITAL_COMMUNITY)
Admission: EM | Admit: 2019-02-05 | Discharge: 2019-02-08 | DRG: 897 | Disposition: A | Discharge status: Home / Self Care | Payer: Federal, State, Local not specified - PPO | Attending: Internal Medicine | Admitting: Internal Medicine

## 2019-02-05 DIAGNOSIS — R945 Abnormal results of liver function studies: Secondary | ICD-10-CM | POA: Diagnosis present

## 2019-02-05 DIAGNOSIS — J449 Chronic obstructive pulmonary disease, unspecified: Secondary | ICD-10-CM | POA: Diagnosis present

## 2019-02-05 DIAGNOSIS — F10231 Alcohol dependence with withdrawal delirium: Secondary | ICD-10-CM | POA: Diagnosis not present

## 2019-02-05 DIAGNOSIS — I10 Essential (primary) hypertension: Secondary | ICD-10-CM | POA: Diagnosis present

## 2019-02-05 DIAGNOSIS — I471 Supraventricular tachycardia: Secondary | ICD-10-CM | POA: Diagnosis present

## 2019-02-05 DIAGNOSIS — K700 Alcoholic fatty liver: Secondary | ICD-10-CM | POA: Diagnosis present

## 2019-02-05 DIAGNOSIS — F411 Generalized anxiety disorder: Secondary | ICD-10-CM | POA: Diagnosis present

## 2019-02-05 DIAGNOSIS — K219 Gastro-esophageal reflux disease without esophagitis: Secondary | ICD-10-CM | POA: Diagnosis present

## 2019-02-05 DIAGNOSIS — Z87891 Personal history of nicotine dependence: Secondary | ICD-10-CM

## 2019-02-05 DIAGNOSIS — E872 Acidosis: Secondary | ICD-10-CM | POA: Diagnosis present

## 2019-02-05 DIAGNOSIS — Z7982 Long term (current) use of aspirin: Secondary | ICD-10-CM

## 2019-02-05 DIAGNOSIS — E876 Hypokalemia: Secondary | ICD-10-CM | POA: Diagnosis present

## 2019-02-05 DIAGNOSIS — F10931 Alcohol use, unspecified with withdrawal delirium: Secondary | ICD-10-CM | POA: Diagnosis present

## 2019-02-05 DIAGNOSIS — Z20828 Contact with and (suspected) exposure to other viral communicable diseases: Secondary | ICD-10-CM | POA: Diagnosis present

## 2019-02-05 DIAGNOSIS — F10239 Alcohol dependence with withdrawal, unspecified: Secondary | ICD-10-CM | POA: Diagnosis not present

## 2019-02-05 DIAGNOSIS — R0602 Shortness of breath: Secondary | ICD-10-CM | POA: Diagnosis not present

## 2019-02-05 DIAGNOSIS — Z79899 Other long term (current) drug therapy: Secondary | ICD-10-CM

## 2019-02-05 DIAGNOSIS — R05 Cough: Secondary | ICD-10-CM | POA: Diagnosis not present

## 2019-02-05 DIAGNOSIS — F10939 Alcohol use, unspecified with withdrawal, unspecified: Secondary | ICD-10-CM | POA: Diagnosis present

## 2019-02-05 DIAGNOSIS — F329 Major depressive disorder, single episode, unspecified: Secondary | ICD-10-CM | POA: Diagnosis present

## 2019-02-05 LAB — CBC WITH DIFFERENTIAL/PLATELET
Abs Immature Granulocytes: 0.04 10*3/uL (ref 0.00–0.07)
Basophils Absolute: 0.1 10*3/uL (ref 0.0–0.1)
Basophils Relative: 1 %
Eosinophils Absolute: 1.2 10*3/uL — ABNORMAL HIGH (ref 0.0–0.5)
Eosinophils Relative: 9 %
HCT: 40 % (ref 36.0–46.0)
Hemoglobin: 13.5 g/dL (ref 12.0–15.0)
Immature Granulocytes: 0 %
Lymphocytes Relative: 5 %
Lymphs Abs: 0.7 10*3/uL (ref 0.7–4.0)
MCH: 32 pg (ref 26.0–34.0)
MCHC: 33.8 g/dL (ref 30.0–36.0)
MCV: 94.8 fL (ref 80.0–100.0)
Monocytes Absolute: 0.5 10*3/uL (ref 0.1–1.0)
Monocytes Relative: 4 %
Neutro Abs: 10.6 10*3/uL — ABNORMAL HIGH (ref 1.7–7.7)
Neutrophils Relative %: 81 %
Platelets: 145 10*3/uL — ABNORMAL LOW (ref 150–400)
RBC: 4.22 MIL/uL (ref 3.87–5.11)
RDW: 15.7 % — ABNORMAL HIGH (ref 11.5–15.5)
WBC: 13.1 10*3/uL — ABNORMAL HIGH (ref 4.0–10.5)
nRBC: 0 % (ref 0.0–0.2)

## 2019-02-05 LAB — AMMONIA: Ammonia: 16 umol/L (ref 9–35)

## 2019-02-05 LAB — ETHANOL: Alcohol, Ethyl (B): <10 mg/dL (ref <10)

## 2019-02-05 LAB — COMPREHENSIVE METABOLIC PANEL
ALT: 15 U/L (ref 0–44)
AST: 42 U/L — ABNORMAL HIGH (ref 15–41)
Albumin: 4.8 g/dL (ref 3.5–5.0)
Alkaline Phosphatase: 68 U/L (ref 38–126)
Anion gap: 22 — ABNORMAL HIGH (ref 5–15)
BUN: 17 mg/dL (ref 8–23)
CO2: 19 mmol/L — ABNORMAL LOW (ref 22–32)
Calcium: 9.4 mg/dL (ref 8.9–10.3)
Chloride: 99 mmol/L (ref 98–111)
Creatinine, Ser: 0.81 mg/dL (ref 0.44–1.00)
GFR calc Af Amer: >60 mL/min (ref >60)
GFR calc non Af Amer: >60 mL/min (ref >60)
Glucose, Bld: 211 mg/dL — ABNORMAL HIGH (ref 70–99)
Potassium: 3.6 mmol/L (ref 3.5–5.1)
Sodium: 140 mmol/L (ref 135–145)
Total Bilirubin: 1.5 mg/dL — ABNORMAL HIGH (ref 0.3–1.2)
Total Protein: 8.2 g/dL — ABNORMAL HIGH (ref 6.5–8.1)

## 2019-02-05 LAB — TROPONIN I (HIGH SENSITIVITY): Troponin I (High Sensitivity): 4 ng/L (ref <18)

## 2019-02-05 LAB — POC SARS CORONAVIRUS 2 AG -  ED: SARS Coronavirus 2 Ag: NEGATIVE

## 2019-02-05 LAB — MAGNESIUM: Magnesium: 1.7 mg/dL (ref 1.7–2.4)

## 2019-02-05 LAB — LACTIC ACID, PLASMA: Lactic Acid, Venous: 7.2 mmol/L (ref 0.5–1.9)

## 2019-02-05 MED ORDER — THIAMINE HCL 100 MG/ML IJ SOLN: 100.0000 mg | Freq: Every day | INTRAMUSCULAR | Status: DC

## 2019-02-05 MED ORDER — LORAZEPAM 1 MG PO TABS: 0.0000 mg | ORAL_TABLET | Freq: Four times a day (QID) | ORAL | Status: DC

## 2019-02-05 MED ORDER — ASPIRIN EC 325 MG PO TBEC
325.0000 mg | DELAYED_RELEASE_TABLET | Freq: Once | ORAL | Status: AC
  Administered 2019-02-05: 325 mg via ORAL
  Filled 2019-02-05: qty 1

## 2019-02-05 MED ORDER — LORAZEPAM 2 MG/ML IJ SOLN: 0.0000 mg | Freq: Four times a day (QID) | INTRAMUSCULAR | Status: DC

## 2019-02-05 MED ORDER — LORAZEPAM 2 MG/ML IJ SOLN
2.0000 mg | Freq: Once | INTRAMUSCULAR | Status: AC
  Administered 2019-02-05: 2 mg via INTRAVENOUS
  Filled 2019-02-05: qty 1

## 2019-02-05 MED ORDER — VITAMIN B-1 100 MG PO TABS: 100.0000 mg | ORAL_TABLET | Freq: Every day | ORAL | Status: DC

## 2019-02-05 MED ORDER — LORAZEPAM 1 MG PO TABS: 0.0000 mg | ORAL_TABLET | Freq: Two times a day (BID) | ORAL | Status: DC

## 2019-02-05 MED ORDER — THIAMINE HCL 100 MG/ML IJ SOLN
Freq: Once | INTRAVENOUS | Status: DC
  Filled 2019-02-05 (×2): qty 1000

## 2019-02-05 MED ORDER — LORAZEPAM 2 MG/ML IJ SOLN: 0.0000 mg | Freq: Two times a day (BID) | INTRAMUSCULAR | Status: DC

## 2019-02-05 MED ORDER — ONDANSETRON HCL 4 MG/2ML IJ SOLN: 4.0000 mg | Freq: Once | INTRAMUSCULAR | Status: DC

## 2019-02-05 NOTE — ED Notes (Signed)
Mia McDonald PA-C made aware of CIWA-AR score of 25

## 2019-02-05 NOTE — ED Provider Notes (Signed)
[facility] EMERGENCY DEPARTMENT Provider Note   CSN: [id number] Arrival date & time: 02/05/19  2245     History   Chief Complaint Chief Complaint  Patient presents with  . ETOH Withdrawal    HPI Terri Ayala is a 63 y.o. female with a history of alcohol use disorder and COPD who presents the emergency department with a chief complaint of alcohol withdrawal.  Last drink this morning who presents to the emergency department with a chief complaint of alcohol withdrawal. On arrival to the ER, she is diaphoretic, tachycardic, and tremulous.  The patient reports that her last drink was around 8 or 9 AM this morning.  She reports that she will drink a couple of bottles of wine per day.  She reports that she was abstinent from drinking for several months after she was admitted in March 2020, but started drinking alcohol again several weeks ago.  She denies SI, HI, or auditory visual hallucinations.  Denies that she drinks and then attempt to harm herself.  She endorses chest pain and tightness in the center of her chest.  Reports this is been constant since this morning and accompanied by dyspnea with exertion and a nonproductive cough for the last 2 days.  She has been having nausea and vomiting. She denies fever, chills, abdominal pain, leg swelling, diarrhea, rash.      The history is provided by the patient. No language interpreter was used.    Past Medical History:  Diagnosis Date  . Alcohol withdrawal ([place]) 11/2017; 01/04/2018  . Alcoholism ([place])   . Anxiety   . Arthritis    "hands" (01/04/2018)  . ASYMPTOMATIC POSTMENOPAUSAL STATUS 08/17/2009  . Chronic lower back pain   . Depression   . DYSPHAGIA 06/19/2007  . Fatty liver, alcoholic   . GERD (gastroesophageal reflux disease)   . GOITER, MULTINODULAR 08/17/2009  . Hyperlipidemia   . Migraines    "not sure what triggers them; I'll have 1 q couple weeks or more; come 2 days in a row when they come" (01/04/2018)   . Mitral valve prolapse   . OSTEOPOROSIS 08/17/2009  . PONV (postoperative nausea and vomiting)   . Supraventricular tachycardia [facility])     Patient Active Problem List   Diagnosis Date Noted  . Abnormal liver function 02/06/2019  . Elevated LFTs 07/12/2018  . Healthcare maintenance 06/26/2018  . Rib fracture 01/04/2018  . Chest pain 12/08/2017  . Leukocytosis 12/08/2017  . Essential hypertension 12/08/2017  . Alcohol withdrawal ([place]) 07/21/2017  . Major depressive disorder, recurrent episode, moderate ([place])   . Alcohol use disorder, severe, dependence ([place]) 04/03/2014  . Lumbosacral radiculopathy at L5 05/20/2013  . Nonspecific elevation of levels of transaminase or lactic acid dehydrogenase (LDH) 03/16/2013  . Hepatic steatosis 03/16/2013  . Chronic cholecystitis 03/14/2013  . Cervical arthritis 10/12/2012  . Palpitations 11/29/2010  . SVT (supraventricular tachycardia) ([place]) 11/29/2010  . Mitral valve prolapse 11/29/2010  . Decreased libido 11/15/2010  . GOITER, MULTINODULAR 08/17/2009  . ANXIETY 08/17/2009  . Migraine headache 08/17/2009  . Hearing loss 08/17/2009  . Osteoporosis 08/17/2009  . ASYMPTOMATIC POSTMENOPAUSAL STATUS 08/17/2009  . DYSPHAGIA 06/19/2007    Past Surgical History:  Procedure Laterality Date  . BREAST BIOPSY Right    "benign"  . CATARACT EXTRACTION W/ INTRAOCULAR LENS  IMPLANT, BILATERAL    . CERVICAL CONE BIOPSY  2000s  . CERVIX LESION DESTRUCTION  1991   "dysplasia; lesions"  . CHOLECYSTECTOMY N/A 03/14/2013   Procedure:  LAPAROSCOPIC CHOLECYSTECTOMY WITH ATTEMPTED INTRAOPERATIVE CHOLANGIOGRAM;  Surgeon: Harl Bowie, MD;  Location: [facility];  Service: General;  Laterality: N/A;  . laporoscopic abdominal surgery     "for endometriosis"  . LEFT HEART CATH AND CORONARY ANGIOGRAPHY N/A 05/07/2018   Procedure: LEFT HEART CATH AND CORONARY ANGIOGRAPHY;  Surgeon: Belva Crome, MD;  Location: [facility];  Service: Cardiovascular;   Laterality: N/A;     OB History   No obstetric history on file.      Home Medications    Prior to Admission medications   Medication Sig Start Date End Date Taking? Authorizing Provider  alendronate (FOSAMAX) 70 MG tablet Take 70 mg by mouth every Monday. Take with a full glass of water on an empty stomach.     [provider]  aspirin EC 81 MG tablet Take 81 mg by mouth daily.    [provider]  busPIRone (BUSPAR) 15 MG tablet Take 15 mg by mouth 3 (three) times daily.    [provider]  DULoxetine (CYMBALTA) 60 MG capsule TAKE 1 CAPSULE (60 MG TOTAL) BY MOUTH DAILY. Patient taking differently: Take 60 mg by mouth 2 (two) times daily.  10/23/14   Denita Lung, MD  hydrOXYzine (ATARAX/VISTARIL) 25 MG tablet Take 25 mg by mouth 2 (two) times daily.     [provider]  Multiple Vitamin (MULTIVITAMIN WITH MINERALS) TABS tablet Take 1 tablet by mouth daily. 01/10/18   Mikhail, Velta Addison, DO  norethindrone-ethinyl estradiol (FEMHRT 1/5) 1-5 MG-MCG TABS Take 1 tablet by mouth daily. For Osteoporosis 04/07/14   Lindell Spar I, NP  ondansetron (ZOFRAN ODT) 4 MG disintegrating tablet Take 1 tablet (4 mg total) by mouth every 8 (eight) hours as needed for nausea or vomiting. 09/01/17   Henson, Vickie L, NP-C  pantoprazole (PROTONIX) 40 MG tablet TAKE 1 TABLET BY MOUTH 2 TIMES DAILY. 11/08/18   Danford, Valetta Fuller D, NP  propranolol (INDERAL) 10 MG tablet TAKE 1 TABLET BY MOUTH 2 TIMES DAILY. 12/11/18   Danford, Valetta Fuller D, NP  SUMAtriptan (IMITREX) 100 MG tablet Take 1 tablet at the first sign of migraine.  May repeat in 2 hours. 06/26/18   Danford, Valetta Fuller D, NP  traZODone (DESYREL) 50 MG tablet Take 50 mg by mouth at bedtime as needed for sleep.     [provider]    Family History Family History  Problem Relation Age of Onset  . Colon cancer Father        Deceased, 12  . Osteoporosis Mother        Living, 78  . Healthy Brother   . Healthy Brother   .  Healthy Son   . Healthy Son   . Thyroid disease Neg Hx   . Goiter Neg Hx     Social History Social History   Tobacco Use  . Smoking status: Former Smoker    Packs/day: 1.00    Years: 7.00    Pack years: 7.00    Types: Cigarettes    Quit date: 02/28/1974    Years since quitting: 44.9  . Smokeless tobacco: Never Used  . Tobacco comment: 01/04/2018 "smoked when I was a teenager"  Substance Use Topics  . Alcohol use: Yes    Alcohol/week: 28.0 standard drinks    Types: 28 Glasses of wine per week    Comment: 01/04/2018 "1 1/2 bottles of wine/day"  . Drug use: Never     Allergies   Doxycycline, Codeine, and Tetracycline  hcl   Review of Systems Review of Systems  Constitutional: Positive for diaphoresis. Negative for activity change, chills and fever.  Respiratory: Positive for cough and shortness of breath.   Cardiovascular: Positive for chest pain. Negative for palpitations and leg swelling.  Gastrointestinal: Positive for nausea and vomiting. Negative for abdominal pain, constipation and diarrhea.  Genitourinary: Negative for dysuria.  Musculoskeletal: Negative for back pain, myalgias, neck pain and neck stiffness.  Skin: Negative for color change and rash.  Allergic/Immunologic: Negative for immunocompromised state.  Neurological: Positive for tremors. Negative for headaches.  Psychiatric/Behavioral: Negative for confusion, hallucinations and suicidal ideas. The patient is not nervous/anxious.      Physical Exam Updated Vital Signs BP (!) 137/93   Pulse (!) 117   Resp (!) 23   Ht 5\' 2"  (1.575 m)   Wt 65.8 kg   SpO2 97%   BMI 26.52 kg/m   Physical Exam Vitals signs and nursing note reviewed.  Constitutional:      General: She is not in acute distress. HENT:     Head: Normocephalic.  Eyes:     Conjunctiva/sclera: Conjunctivae normal.  Neck:     Musculoskeletal: Neck supple.  Cardiovascular:     Rate and Rhythm: Normal rate and regular rhythm.     Heart  sounds: No murmur. No friction rub. No gallop.   Pulmonary:     Effort: Pulmonary effort is normal. No respiratory distress.  Abdominal:     General: There is no distension.     Palpations: Abdomen is soft.  Skin:    General: Skin is warm.     Findings: No rash.  Neurological:     Mental Status: She is alert.  Psychiatric:        Behavior: Behavior normal.      ED Treatments / Results  Labs (all labs ordered are listed, but only abnormal results are displayed) Labs Reviewed  CBC WITH DIFFERENTIAL/PLATELET - Abnormal; Notable for the following components:      Result Value   WBC 13.1 (*)    RDW 15.7 (*)    Platelets 145 (*)    Neutro Abs 10.6 (*)    Eosinophils Absolute 1.2 (*)    All other components within normal limits  COMPREHENSIVE METABOLIC PANEL - Abnormal; Notable for the following components:   CO2 19 (*)    Glucose, Bld 211 (*)    Total Protein 8.2 (*)    AST 42 (*)    Total Bilirubin 1.5 (*)    Anion gap 22 (*)    All other components within normal limits  LACTIC ACID, PLASMA - Abnormal; Notable for the following components:   Lactic Acid, Venous 7.2 (*)    All other components within normal limits  LACTIC ACID, PLASMA - Abnormal; Notable for the following components:   Lactic Acid, Venous 2.4 (*)    All other components within normal limits  SARS CORONAVIRUS 2 (TAT 6-24 HRS)  ETHANOL  AMMONIA  MAGNESIUM  RAPID URINE DRUG SCREEN, HOSP PERFORMED  URINALYSIS, ROUTINE W REFLEX MICROSCOPIC  LIPASE, BLOOD  HIV ANTIBODY (ROUTINE TESTING W REFLEX)  COMPREHENSIVE METABOLIC PANEL  CBC  HEPATITIS PANEL, ACUTE  POC SARS CORONAVIRUS 2 AG -  ED  TROPONIN I (HIGH SENSITIVITY)  TROPONIN I (HIGH SENSITIVITY)    EKG EKG Interpretation  Date/Time:  Tuesday February 05 2019 23:02:19 EST Ventricular Rate:  119 PR Interval:    QRS Duration: 83 QT Interval:  350 QTC Calculation: 493 R Axis:  71 Text Interpretation: Sinus tachycardia Confirmed by Dory Horn) on 02/05/2019 11:06:14 PM   Radiology Dg Chest Portable 1 View  Result Date: 02/05/2019 CLINICAL DATA:  Shortness of breath and cough tachycardia and diaphoresis EXAM: PORTABLE CHEST 1 VIEW COMPARISON:  05/04/2018 FINDINGS: Cardiac shadow is stable. The lungs are hyperinflated consistent with COPD. No focal infiltrate is seen. Old rib fractures are noted with healing on right. No acute bony abnormality is seen. IMPRESSION: COPD without acute abnormality. Electronically Signed   By: Inez Catalina M.D.   On: 02/05/2019 23:22    Procedures .Critical Care Performed by: Joanne Gavel, PA-C Authorized by: Joanne Gavel, PA-C   Critical care provider statement:    Critical care time (minutes):  40   Critical care was necessary to treat or prevent imminent or life-threatening deterioration of the following conditions:  Toxidrome   Critical care was time spent personally by me on the following activities:  Ordering and performing treatments and interventions, ordering and review of laboratory studies, ordering and review of radiographic studies, pulse oximetry, re-evaluation of patient's condition, review of old charts, obtaining history from patient or surrogate, examination of patient, evaluation of patient's response to treatment and development of treatment plan with patient or surrogate   (including critical care time)  Medications Ordered in ED Medications  enoxaparin (LOVENOX) injection 40 mg (has no administration in time range)  aspirin EC tablet 81 mg (has no administration in time range)  propranolol (INDERAL) tablet 10 mg (10 mg Oral Given 02/06/19 0540)  busPIRone (BUSPAR) tablet 15 mg (has no administration in time range)  DULoxetine (CYMBALTA) DR capsule 60 mg (60 mg Oral Given 02/06/19 0540)  hydrOXYzine (ATARAX/VISTARIL) tablet 25 mg (25 mg Oral Given 02/06/19 0217)  traZODone (DESYREL) tablet 50 mg (has no administration in time range)  norethindrone-ethinyl  estradiol (FEMHRT 1/5) 1-5 MG-MCG per tablet 1 tablet (has no administration in time range)  pantoprazole (PROTONIX) EC tablet 40 mg (40 mg Oral Given 02/06/19 0217)  multivitamin with minerals tablet 1 tablet (has no administration in time range)  ondansetron (ZOFRAN) injection 4 mg (has no administration in time range)  LORazepam (ATIVAN) tablet 1-4 mg (has no administration in time range)    Or  LORazepam (ATIVAN) injection 1-4 mg (has no administration in time range)  thiamine (VITAMIN B-1) tablet 100 mg (has no administration in time range)    Or  thiamine (B-1) injection 100 mg (has no administration in time range)  folic acid (FOLVITE) tablet 1 mg (has no administration in time range)  multivitamin with minerals tablet 1 tablet (has no administration in time range)  LORazepam (ATIVAN) injection 0-4 mg (3 mg Intravenous Given 02/06/19 0547)    Followed by  LORazepam (ATIVAN) injection 0-4 mg (has no administration in time range)  chlordiazePOXIDE (LIBRIUM) capsule 25 mg (has no administration in time range)  LORazepam (ATIVAN) injection 2 mg (2 mg Intravenous Given 02/05/19 2301)  aspirin EC tablet 325 mg (325 mg Oral Given 02/05/19 2313)  dextrose 5 % and 0.45% NaCl 1,000 mL with thiamine 123XX123 mg, folic acid 1 mg, multivitamins adult 10 mL, magnesium sulfate 2 g infusion ( Intravenous New Bag/Given (Non-Interop) 02/06/19 0125)  sodium chloride 0.9 % bolus 1,000 mL (0 mLs Intravenous Stopped 02/06/19 0222)  chlordiazePOXIDE (LIBRIUM) capsule 50 mg (50 mg Oral Given 02/06/19 0217)     Initial Impression / Assessment and Plan / ED Course  I have reviewed the triage vital signs and the  nursing notes.  Pertinent labs & imaging results that were available during my care of the patient were reviewed by me and considered in my medical decision making (see chart for details).        63 year old female with a history of alcohol use disorder and COPD presenting with alcohol withdrawal.  Patient  is tachycardic in the 120s, tremulous, and nauseated. Last drink was this morning. CIWA score is 25 on arrival. CIWA protocol ordered.   Alert and oriented x4.  Denies hallucinations.  No evidence of DTs.  Lactate is 7.2.  This could be from alcohol withdrawal seizures, but the patient is adamant that she has not had any seizure-like activity.  Suspect metabolic acidosis secondary to alcohol use.  Will give banana bag with D5, magnesium, thiamine.   She was also complaining of chest pain and shortness of breath.  Troponin is not elevated.  EKG without evidence of ischemia.  EKG with sinus tachycardia.  Reviewed the patient's heart catheterization from earlier this year, which demonstrated widely patent vasculature.  Doubt CAD.  Consult to the hospitalist team and Dr. [name] will admit for alcohol withdrawal. The patient appears reasonably stabilized for admission considering the current resources, flow, and capabilities available in the ED at this time, and I doubt any other [facility] requiring further screening and/or treatment in the ED prior to admission.   Final Clinical Impressions(s) / ED Diagnoses   Final diagnoses:  Alcohol withdrawal syndrome with complication [facility])    ED Discharge Orders    None       Joanne Gavel, PA-C 02/06/19 0606    Palumbo, April, MD 02/06/19 [id number]

## 2019-02-05 NOTE — ED Triage Notes (Signed)
Pt BIB [facility] for eval of ETOH w/d. Pt reports she last drank this AM, pt is tachycardic, diaphoretic, N/V, myalgias. Pt appears extremely anxious on arrival, tremulous.

## 2019-02-05 NOTE — ED Notes (Signed)
PA notified on patient's elevated lactic acid result.

## 2019-02-06 ENCOUNTER — Other Ambulatory Visit: Payer: Self-pay

## 2019-02-06 DIAGNOSIS — Z79899 Other long term (current) drug therapy: Secondary | ICD-10-CM | POA: Diagnosis not present

## 2019-02-06 DIAGNOSIS — Z20828 Contact with and (suspected) exposure to other viral communicable diseases: Secondary | ICD-10-CM | POA: Diagnosis present

## 2019-02-06 DIAGNOSIS — F10231 Alcohol dependence with withdrawal delirium: Secondary | ICD-10-CM | POA: Diagnosis present

## 2019-02-06 DIAGNOSIS — I471 Supraventricular tachycardia: Secondary | ICD-10-CM | POA: Diagnosis present

## 2019-02-06 DIAGNOSIS — F10931 Alcohol use, unspecified with withdrawal delirium: Secondary | ICD-10-CM | POA: Diagnosis present

## 2019-02-06 DIAGNOSIS — E876 Hypokalemia: Secondary | ICD-10-CM | POA: Diagnosis present

## 2019-02-06 DIAGNOSIS — E872 Acidosis: Secondary | ICD-10-CM | POA: Diagnosis present

## 2019-02-06 DIAGNOSIS — F10239 Alcohol dependence with withdrawal, unspecified: Secondary | ICD-10-CM | POA: Diagnosis present

## 2019-02-06 DIAGNOSIS — F10230 Alcohol dependence with withdrawal, uncomplicated: Secondary | ICD-10-CM | POA: Diagnosis not present

## 2019-02-06 DIAGNOSIS — F329 Major depressive disorder, single episode, unspecified: Secondary | ICD-10-CM | POA: Diagnosis present

## 2019-02-06 DIAGNOSIS — K219 Gastro-esophageal reflux disease without esophagitis: Secondary | ICD-10-CM | POA: Diagnosis present

## 2019-02-06 DIAGNOSIS — I10 Essential (primary) hypertension: Secondary | ICD-10-CM | POA: Diagnosis present

## 2019-02-06 DIAGNOSIS — F411 Generalized anxiety disorder: Secondary | ICD-10-CM | POA: Diagnosis present

## 2019-02-06 DIAGNOSIS — R945 Abnormal results of liver function studies: Secondary | ICD-10-CM | POA: Diagnosis present

## 2019-02-06 DIAGNOSIS — K700 Alcoholic fatty liver: Secondary | ICD-10-CM | POA: Diagnosis present

## 2019-02-06 DIAGNOSIS — R0789 Other chest pain: Secondary | ICD-10-CM | POA: Diagnosis not present

## 2019-02-06 DIAGNOSIS — J449 Chronic obstructive pulmonary disease, unspecified: Secondary | ICD-10-CM | POA: Diagnosis present

## 2019-02-06 DIAGNOSIS — Z87891 Personal history of nicotine dependence: Secondary | ICD-10-CM | POA: Diagnosis not present

## 2019-02-06 DIAGNOSIS — Z7982 Long term (current) use of aspirin: Secondary | ICD-10-CM | POA: Diagnosis not present

## 2019-02-06 LAB — RAPID URINE DRUG SCREEN, HOSP PERFORMED
Amphetamines: NOT DETECTED
Barbiturates: NOT DETECTED
Benzodiazepines: POSITIVE — AB
Cocaine: NOT DETECTED
Opiates: NOT DETECTED
Tetrahydrocannabinol: NOT DETECTED

## 2019-02-06 LAB — COMPREHENSIVE METABOLIC PANEL
ALT: 13 U/L (ref 0–44)
AST: 31 U/L (ref 15–41)
Albumin: 4 g/dL (ref 3.5–5.0)
Alkaline Phosphatase: 52 U/L (ref 38–126)
Anion gap: 15 (ref 5–15)
BUN: 17 mg/dL (ref 8–23)
CO2: 20 mmol/L — ABNORMAL LOW (ref 22–32)
Calcium: 8.4 mg/dL — ABNORMAL LOW (ref 8.9–10.3)
Chloride: 104 mmol/L (ref 98–111)
Creatinine, Ser: 0.64 mg/dL (ref 0.44–1.00)
GFR calc Af Amer: >60 mL/min (ref >60)
GFR calc non Af Amer: >60 mL/min (ref >60)
Glucose, Bld: 109 mg/dL — ABNORMAL HIGH (ref 70–99)
Potassium: 3.4 mmol/L — ABNORMAL LOW (ref 3.5–5.1)
Sodium: 139 mmol/L (ref 135–145)
Total Bilirubin: 1.4 mg/dL — ABNORMAL HIGH (ref 0.3–1.2)
Total Protein: 6.7 g/dL (ref 6.5–8.1)

## 2019-02-06 LAB — URINALYSIS, ROUTINE W REFLEX MICROSCOPIC
Bilirubin Urine: NEGATIVE
Glucose, UA: 150 mg/dL — AB
Hgb urine dipstick: NEGATIVE
Ketones, ur: 20 mg/dL — AB
Leukocytes,Ua: NEGATIVE
Nitrite: NEGATIVE
Protein, ur: 100 mg/dL — AB
Specific Gravity, Urine: 1.021 (ref 1.005–1.030)
pH: 8 (ref 5.0–8.0)

## 2019-02-06 LAB — CBC
HCT: 36.3 % (ref 36.0–46.0)
Hemoglobin: 11.8 g/dL — ABNORMAL LOW (ref 12.0–15.0)
MCH: 32 pg (ref 26.0–34.0)
MCHC: 32.5 g/dL (ref 30.0–36.0)
MCV: 98.4 fL (ref 80.0–100.0)
Platelets: UNDETERMINED 10*3/uL (ref 150–400)
RBC: 3.69 MIL/uL — ABNORMAL LOW (ref 3.87–5.11)
RDW: 15.6 % — ABNORMAL HIGH (ref 11.5–15.5)
WBC: 10.7 10*3/uL — ABNORMAL HIGH (ref 4.0–10.5)
nRBC: 0 % (ref 0.0–0.2)

## 2019-02-06 LAB — TROPONIN I (HIGH SENSITIVITY): Troponin I (High Sensitivity): 6 ng/L (ref <18)

## 2019-02-06 LAB — HIV ANTIBODY (ROUTINE TESTING W REFLEX): HIV Screen 4th Generation wRfx: NONREACTIVE

## 2019-02-06 LAB — HEPATITIS PANEL, ACUTE
HCV Ab: NONREACTIVE
Hep A IgM: NONREACTIVE
Hep B C IgM: NONREACTIVE
Hepatitis B Surface Ag: NONREACTIVE

## 2019-02-06 LAB — LIPASE, BLOOD: Lipase: 50 U/L (ref 11–51)

## 2019-02-06 LAB — LACTIC ACID, PLASMA: Lactic Acid, Venous: 2.4 mmol/L (ref 0.5–1.9)

## 2019-02-06 LAB — SARS CORONAVIRUS 2 (TAT 6-24 HRS): SARS Coronavirus 2: NEGATIVE

## 2019-02-06 MED ORDER — BUSPIRONE HCL 10 MG PO TABS
15.0000 mg | ORAL_TABLET | Freq: Three times a day (TID) | ORAL | Status: DC
  Administered 2019-02-06 – 2019-02-08 (×8): 15 mg via ORAL
  Filled 2019-02-06 (×8): qty 2

## 2019-02-06 MED ORDER — PROPRANOLOL HCL 10 MG PO TABS
10.0000 mg | ORAL_TABLET | Freq: Two times a day (BID) | ORAL | Status: DC
  Administered 2019-02-06 – 2019-02-08 (×6): 10 mg via ORAL
  Filled 2019-02-06 (×8): qty 1

## 2019-02-06 MED ORDER — VITAMIN B-1 100 MG PO TABS
100.0000 mg | ORAL_TABLET | Freq: Every day | ORAL | Status: DC
  Administered 2019-02-06 – 2019-02-08 (×3): 100 mg via ORAL
  Filled 2019-02-06 (×3): qty 1

## 2019-02-06 MED ORDER — THIAMINE HCL 100 MG/ML IJ SOLN: 100.0000 mg | Freq: Every day | INTRAMUSCULAR | Status: DC

## 2019-02-06 MED ORDER — THIAMINE HCL 100 MG/ML IJ SOLN
Freq: Once | INTRAVENOUS | Status: AC
  Administered 2019-02-06: 01:00:00 via INTRAVENOUS
  Filled 2019-02-06: qty 1000

## 2019-02-06 MED ORDER — FOLIC ACID 1 MG PO TABS
1.0000 mg | ORAL_TABLET | Freq: Every day | ORAL | Status: DC
  Administered 2019-02-06 – 2019-02-08 (×3): 1 mg via ORAL
  Filled 2019-02-06 (×3): qty 1

## 2019-02-06 MED ORDER — MAGNESIUM SULFATE 2 GM/50ML IV SOLN: 2.0000 g | Freq: Once | INTRAVENOUS | Status: DC

## 2019-02-06 MED ORDER — CHLORDIAZEPOXIDE HCL 25 MG PO CAPS
50.0000 mg | ORAL_CAPSULE | Freq: Once | ORAL | Status: AC
  Administered 2019-02-06: 50 mg via ORAL
  Filled 2019-02-06: qty 2

## 2019-02-06 MED ORDER — PANTOPRAZOLE SODIUM 40 MG PO TBEC
40.0000 mg | DELAYED_RELEASE_TABLET | Freq: Two times a day (BID) | ORAL | Status: DC
  Administered 2019-02-06 – 2019-02-08 (×6): 40 mg via ORAL
  Filled 2019-02-06 (×6): qty 1

## 2019-02-06 MED ORDER — CHLORDIAZEPOXIDE HCL 25 MG PO CAPS
25.0000 mg | ORAL_CAPSULE | Freq: Once | ORAL | Status: AC
  Administered 2019-02-07: 25 mg via ORAL
  Filled 2019-02-06: qty 1

## 2019-02-06 MED ORDER — LORAZEPAM 2 MG/ML IJ SOLN
1.0000 mg | INTRAMUSCULAR | Status: DC | PRN
  Administered 2019-02-07: 2 mg via INTRAVENOUS

## 2019-02-06 MED ORDER — NORETHINDRONE-ETH ESTRADIOL 1-5 MG-MCG PO TABS: 1.0000 | ORAL_TABLET | Freq: Every day | ORAL | Status: DC

## 2019-02-06 MED ORDER — ONDANSETRON HCL 4 MG/2ML IJ SOLN: 4.0000 mg | Freq: Four times a day (QID) | INTRAMUSCULAR | Status: DC | PRN

## 2019-02-06 MED ORDER — LORAZEPAM 2 MG/ML IJ SOLN: 0.0000 mg | Freq: Three times a day (TID) | INTRAMUSCULAR | Status: DC

## 2019-02-06 MED ORDER — ADULT MULTIVITAMIN W/MINERALS CH: 1.0000 | ORAL_TABLET | Freq: Every day | ORAL | Status: DC

## 2019-02-06 MED ORDER — HYDROXYZINE HCL 25 MG PO TABS
25.0000 mg | ORAL_TABLET | Freq: Two times a day (BID) | ORAL | Status: DC
  Administered 2019-02-06 – 2019-02-08 (×6): 25 mg via ORAL
  Filled 2019-02-06 (×6): qty 1

## 2019-02-06 MED ORDER — LORAZEPAM 1 MG PO TABS: 1.0000 mg | ORAL_TABLET | ORAL | Status: DC | PRN

## 2019-02-06 MED ORDER — ENOXAPARIN SODIUM 40 MG/0.4ML ~~LOC~~ SOLN
40.0000 mg | Freq: Every day | SUBCUTANEOUS | Status: DC
  Administered 2019-02-06 – 2019-02-08 (×3): 40 mg via SUBCUTANEOUS
  Filled 2019-02-06 (×3): qty 0.4

## 2019-02-06 MED ORDER — TRAZODONE HCL 50 MG PO TABS: 50.0000 mg | ORAL_TABLET | Freq: Every evening | ORAL | Status: DC | PRN

## 2019-02-06 MED ORDER — ASPIRIN EC 81 MG PO TBEC
81.0000 mg | DELAYED_RELEASE_TABLET | Freq: Every day | ORAL | Status: DC
  Administered 2019-02-06 – 2019-02-08 (×3): 81 mg via ORAL
  Filled 2019-02-06 (×3): qty 1

## 2019-02-06 MED ORDER — LORAZEPAM 2 MG/ML IJ SOLN
0.0000 mg | INTRAMUSCULAR | Status: AC
  Administered 2019-02-06 (×2): 2 mg via INTRAVENOUS
  Administered 2019-02-06: 1 mg via INTRAVENOUS
  Administered 2019-02-06: 3 mg via INTRAVENOUS
  Administered 2019-02-06 – 2019-02-07 (×2): 1 mg via INTRAVENOUS
  Administered 2019-02-07: 2 mg via INTRAVENOUS
  Administered 2019-02-07 (×2): 1 mg via INTRAVENOUS
  Filled 2019-02-06 (×6): qty 1
  Filled 2019-02-06: qty 2
  Filled 2019-02-06 (×3): qty 1

## 2019-02-06 MED ORDER — DULOXETINE HCL 60 MG PO CPEP
60.0000 mg | ORAL_CAPSULE | Freq: Two times a day (BID) | ORAL | Status: DC
  Administered 2019-02-06 – 2019-02-08 (×6): 60 mg via ORAL
  Filled 2019-02-06 (×8): qty 1

## 2019-02-06 MED ORDER — ADULT MULTIVITAMIN W/MINERALS CH
1.0000 | ORAL_TABLET | Freq: Every day | ORAL | Status: DC
  Administered 2019-02-06 – 2019-02-08 (×3): 1 via ORAL
  Filled 2019-02-06 (×3): qty 1

## 2019-02-06 MED ORDER — SODIUM CHLORIDE 0.9 % IV BOLUS
1000.0000 mL | Freq: Once | INTRAVENOUS | Status: AC
  Administered 2019-02-06: 1000 mL via INTRAVENOUS

## 2019-02-06 NOTE — Progress Notes (Signed)
PROGRESS NOTE    Terri Ayala  [id number] DOB: 1955/03/25 DOA: 02/05/2019 PCP: Esaw Grandchild, NP    Brief Narrative:  63 year old female who presented from home with alcohol withdrawal symptoms.  She does have significant past medical history of SVT, alcoholism, who apparently stopped drinking alcohol 24 hours prior to hospitalization.  She has experienced palpitations, chest pressure and dyspnea.  On her initial physical examination her pulse rate was 119, respiratory rate 16, blood pressure 164/96, oxygen saturation 100% on room air.  Her lungs were clear to auscultation bilaterally, heart S1-S2 present rhythmic, abdomen soft, no lower extremity edema. Sodium 140, potassium 3.6, chloride 99, bicarb 19, glucose 211, BUN 17, creatinine 0.81, magnesium 1.7, white count 13.1, hemoglobin 13.5, hematocrit 40.0, platelets 145.  SARS COVID-19 negative.  Urinalysis 0-5 white, specific gravity 1.021, 100 protein.  Alcohol level less than 10, positive benzodiazepines.  Chest radiograph positive hyperinflation, no infiltrates.  EKG 119 bpm, normal axis, normal intervals, sinus rhythm, no ST segment T wave changes.  Patient has been admitted to the hospital with working diagnosis of alcohol withdrawal syndrome  Assessment & Plan:   Principal Problem:   Alcohol withdrawal ([place]) Active Problems:   Abnormal liver function   Alcohol withdrawal delirium ([place])   1. Acute alcohol withdrawal syndrome. Patient continue to have symptoms, despite IV lorazepam 8 mg since admission. Will change patient to inpatient, due to high risk for worsening withdrawal and need inpatient monitoring and continue lorazepam per CIWA protocol. Will continue neuro checks q 4 H and aspiration precautions. Continue thiamine and vitamins.   2. Depression. Continue with buspirone and duloxetine.    DVT prophylaxis: enoxaparin   Code Status:  full Family Communication: no family at the bedside  Disposition Plan/ discharge  barriers:  Pending clinical improvement.   Body mass index is 26.52 kg/m. Malnutrition Type:      Malnutrition Characteristics:      Nutrition Interventions:     RN Pressure Injury Documentation:     Consultants:     Procedures:     Antimicrobials:       Subjective: Patient is somnolent, continue to have nausea but not vomiting, not feeling back to her baseline. No chest pain or dyspnea. Continue to have tremors.   Objective: Vitals:   02/06/19 0930 02/06/19 1000 02/06/19 1030 02/06/19 1100  BP: (!) 139/99 (!) 144/90 136/89 138/87  Pulse: 96 98 85   Resp: 14 17 16 17   Temp:      TempSrc:      SpO2: 99% 100% 98%   Weight:      Height:        Intake/Output Summary (Last 24 hours) at 02/06/2019 1153 Last data filed at 02/06/2019 1035 Gross per 24 hour  Intake 2000 ml  Output --  Net 2000 ml   Filed Weights   02/05/19 2249  Weight: 65.8 kg    Examination:   General: deconditioned, somnolent.  Neurology: Awake and alert, non focal  E ENT: mild pallor, no icterus, oral mucosa moist Cardiovascular: No JVD. S1-S2 present, rhythmic, no gallops, rubs, or murmurs. No lower extremity edema. Pulmonary: positive breath sounds bilaterally. Gastrointestinal. Abdomen with no organomegaly, non tender, no rebound or guarding Skin. No rashes Musculoskeletal: no joint deformities     Data Reviewed: I have personally reviewed following labs and imaging studies  CBC: Recent Labs  Lab 02/05/19 2258 02/06/19 0608  WBC 13.1* 10.7*  NEUTROABS 10.6*  --   HGB 13.5 11.8*  HCT 40.0 36.3  MCV 94.8 98.4  PLT 145* PLATELET CLUMPS NOTED ON SMEAR, UNABLE TO ESTIMATE   Basic Metabolic Panel: Recent Labs  Lab 02/05/19 2258 02/06/19 0608  NA 140 139  K 3.6 3.4*  CL 99 104  CO2 19* 20*  GLUCOSE 211* 109*  BUN 17 17  CREATININE 0.81 0.64  CALCIUM 9.4 8.4*  MG 1.7  --    GFR: Estimated Creatinine Clearance: 63.3 mL/min (by C-G formula based on SCr of  0.64 mg/dL). Liver Function Tests: Recent Labs  Lab 02/05/19 2258 02/06/19 0608  AST 42* 31  ALT 15 13  ALKPHOS 68 52  BILITOT 1.5* 1.4*  PROT 8.2* 6.7  ALBUMIN 4.8 4.0   Recent Labs  Lab 02/06/19 0608  LIPASE 50   Recent Labs  Lab 02/05/19 2252  AMMONIA 16   Coagulation Profile: No results for input(s): INR, PROTIME in the last 168 hours. Cardiac Enzymes: No results for input(s): CKTOTAL, CKMB, CKMBINDEX, TROPONINI in the last 168 hours. BNP (last 3 results) No results for input(s): PROBNP in the last 8760 hours. HbA1C: No results for input(s): HGBA1C in the last 72 hours. CBG: No results for input(s): GLUCAP in the last 168 hours. Lipid Profile: No results for input(s): CHOL, HDL, LDLCALC, TRIG, CHOLHDL, LDLDIRECT in the last 72 hours. Thyroid Function Tests: No results for input(s): TSH, T4TOTAL, FREET4, T3FREE, THYROIDAB in the last 72 hours. Anemia Panel: No results for input(s): VITAMINB12, FOLATE, FERRITIN, TIBC, IRON, RETICCTPCT in the last 72 hours.    Radiology Studies: I have reviewed all of the imaging during this hospital visit personally     Scheduled Meds:  aspirin EC  81 mg Oral Daily   busPIRone  15 mg Oral TID   [START ON 02/07/2019] chlordiazePOXIDE  25 mg Oral Once   DULoxetine  60 mg Oral BID   enoxaparin (LOVENOX) injection  40 mg Subcutaneous Daily   folic acid  1 mg Oral Daily   hydrOXYzine  25 mg Oral BID   LORazepam  0-4 mg Intravenous Q4H   Followed by   Derrill Memo ON 02/08/2019] LORazepam  0-4 mg Intravenous Q8H   multivitamin with minerals  1 tablet Oral Daily   multivitamin with minerals  1 tablet Oral Daily   norethindrone-ethinyl estradiol  1 tablet Oral Daily   pantoprazole  40 mg Oral BID   propranolol  10 mg Oral BID   thiamine  100 mg Oral Daily   Or   thiamine  100 mg Intravenous Daily   Continuous Infusions:   LOS: 0 days        Samon Dishner Gerome Apley, MD

## 2019-02-06 NOTE — ED Notes (Signed)
SDU  Breakfast ordered  

## 2019-02-06 NOTE — H&P (Signed)
TRH H&P    Patient Demographics:    Terri Ayala, is a 63 y.o. female  MRN: [id number]  DOB - 04/12/1955  Admit Date - 02/05/2019  Referring MD/NP/PA:  Joline Maxcy  Outpatient Primary MD for the patient is Danford, Berna Spare, NP  Patient coming from: home  Chief complaint-  Alcohol withdrawal   HPI:    Terri Ayala  is a 63 y.o. female,  w hypertension, SVT, MVP, Alcoholism, MDD, GAD, who presents with stopping drinking yesterday. Pt notes had palpitations and chest pressure/ sob.  And feeling shaky all over.    In ED,  T afebrile P 119, R 16, Bp 164/96  Pox 100% on RA Wt 65.8kg  CXR IMPRESSION: COPD without acute abnormality.  Wbc 13.1, Hgb 13.5, Plt 145 Na 140, K 3.6,  Bun 17, Creatinine 0.81 Ast 42, Alt 15 Magnesium 1.7 Alcohol <10 Lactic acid 7.2  EKG st at 120, nl axis, no st-t changes c/w ischemia  Pt will be admitted for etoh withdrawal     Review of systems:    In addition to the HPI above,  No Fever-chills, No Headache, No changes with Vision or hearing, No problems swallowing food or Liquids, No  Cough  No Abdominal pain, No Nausea or Vomiting, bowel movements are regular, No Blood in stool or Urine, No dysuria, No new skin rashes or bruises, No new joints pains-aches,  No new weakness, tingling, numbness in any extremity, No recent weight gain or loss, No polyuria, polydypsia or polyphagia, No significant Mental Stressors.  All other systems reviewed and are negative.    Past History of the following :    Past Medical History:  Diagnosis Date  . Alcohol withdrawal ([place]) 11/2017; 01/04/2018  . Alcoholism ([place])   . Anxiety   . Arthritis    "hands" (01/04/2018)  . ASYMPTOMATIC POSTMENOPAUSAL STATUS 08/17/2009  . Chronic lower back pain   . Depression   . DYSPHAGIA 06/19/2007  . Fatty liver, alcoholic   . GERD (gastroesophageal reflux disease)   . GOITER,  MULTINODULAR 08/17/2009  . Hyperlipidemia   . Migraines    "not sure what triggers them; I'll have 1 q couple weeks or more; come 2 days in a row when they come" (01/04/2018)  . Mitral valve prolapse   . OSTEOPOROSIS 08/17/2009  . PONV (postoperative nausea and vomiting)   . Supraventricular tachycardia [facility])       Past Surgical History:  Procedure Laterality Date  . BREAST BIOPSY Right    "benign"  . CATARACT EXTRACTION W/ INTRAOCULAR LENS  IMPLANT, BILATERAL    . CERVICAL CONE BIOPSY  2000s  . CERVIX LESION DESTRUCTION  1991   "dysplasia; lesions"  . CHOLECYSTECTOMY N/A 03/14/2013   Procedure: LAPAROSCOPIC CHOLECYSTECTOMY WITH ATTEMPTED INTRAOPERATIVE CHOLANGIOGRAM;  Surgeon: Harl Bowie, MD;  Location: [place];  Service: General;  Laterality: N/A;  . laporoscopic abdominal surgery     "for endometriosis"  . LEFT HEART CATH AND CORONARY ANGIOGRAPHY N/A 05/07/2018   Procedure: LEFT HEART CATH AND CORONARY ANGIOGRAPHY;  Surgeon: Belva Crome, MD;  Location: [place] [facility];  Service: Cardiovascular;  Laterality: N/A;      Social History:      Social History   Tobacco Use  . Smoking status: Former Smoker    Packs/day: 1.00    Years: 7.00    Pack years: 7.00    Types: Cigarettes    Quit date: 02/28/1974    Years since quitting: 44.9  . Smokeless tobacco: Never Used  . Tobacco comment: 01/04/2018 "smoked when I was a teenager"  Substance Use Topics  . Alcohol use: Yes    Alcohol/week: 28.0 standard drinks    Types: 28 Glasses of wine per week    Comment: 01/04/2018 "1 1/2 bottles of wine/day"       Family History :     Family History  Problem Relation Age of Onset  . Colon cancer Father        Deceased, 45  . Osteoporosis Mother        Living, 63  . Healthy Brother   . Healthy Brother   . Healthy Son   . Healthy Son   . Thyroid disease Neg Hx   . Goiter Neg Hx        Home Medications:   Prior to Admission medications   Medication Sig Start Date End  Date Taking? Authorizing Provider  alendronate (FOSAMAX) 70 MG tablet Take 70 mg by mouth every Monday. Take with a full glass of water on an empty stomach.     [provider]  aspirin EC 81 MG tablet Take 81 mg by mouth daily.    [provider]  busPIRone (BUSPAR) 15 MG tablet Take 15 mg by mouth 3 (three) times daily.    [provider]  DULoxetine (CYMBALTA) 60 MG capsule TAKE 1 CAPSULE (60 MG TOTAL) BY MOUTH DAILY. Patient taking differently: Take 60 mg by mouth 2 (two) times daily.  10/23/14   Denita Lung, MD  hydrOXYzine (ATARAX/VISTARIL) 25 MG tablet Take 25 mg by mouth 2 (two) times daily.     [provider]  Multiple Vitamin (MULTIVITAMIN WITH MINERALS) TABS tablet Take 1 tablet by mouth daily. 01/10/18   Mikhail, Velta Addison, DO  norethindrone-ethinyl estradiol (FEMHRT 1/5) 1-5 MG-MCG TABS Take 1 tablet by mouth daily. For Osteoporosis 04/07/14   Lindell Spar I, NP  ondansetron (ZOFRAN ODT) 4 MG disintegrating tablet Take 1 tablet (4 mg total) by mouth every 8 (eight) hours as needed for nausea or vomiting. 09/01/17   Henson, Vickie L, NP-C  pantoprazole (PROTONIX) 40 MG tablet TAKE 1 TABLET BY MOUTH 2 TIMES DAILY. 11/08/18   Danford, Valetta Fuller D, NP  propranolol (INDERAL) 10 MG tablet TAKE 1 TABLET BY MOUTH 2 TIMES DAILY. 12/11/18   Danford, Valetta Fuller D, NP  SUMAtriptan (IMITREX) 100 MG tablet Take 1 tablet at the first sign of migraine.  May repeat in 2 hours. 06/26/18   Danford, Valetta Fuller D, NP  traZODone (DESYREL) 50 MG tablet Take 50 mg by mouth at bedtime as needed for sleep.     [provider]     Allergies:     Allergies  Allergen Reactions  . Doxycycline Swelling    Mouth swelling and sores  . Codeine Itching  . Tetracycline Hcl Swelling    Mouth swelling and sores     Physical Exam:   Vitals  Blood pressure (!) 141/103, pulse (!) 126, resp. rate (!) 21, height 5' 2" (1.575 m), weight 65.8  kg, SpO2 100 %.  1.  General: axoxo3  2.  Psychiatric: euthymic  3. Neurologic: cn2-12 intact, reflexes 2+ symmetric, diffuse with no clonus, motor 5/5 in all 4 ext  4. HEENMT:  Anicteric, pupils 1.52m symmetric, direct, consensual, near intact Neck: no jvd  5. Respiratory : CTAB  6. Cardiovascular : Tachy s1, s2, no m/g/r  7. Gastrointestinal:  Abd: soft, nt, nd, +bs  8. Skin:  Ext: no c/c/e, no rash  9.Musculoskeletal:  Good ROM    Data Review:    CBC Recent Labs  Lab 02/05/19 2258  WBC 13.1*  HGB 13.5  HCT 40.0  PLT 145*  MCV 94.8  MCH 32.0  MCHC 33.8  RDW 15.7*  LYMPHSABS 0.7  MONOABS 0.5  EOSABS 1.2*  BASOSABS 0.1   ------------------------------------------------------------------------------------------------------------------  Results for orders placed or performed during the hospital encounter of 02/05/19 (from the past 48 hour(s))  Ammonia     Status: None   Collection Time: 02/05/19 10:52 PM  Result Value Ref Range   Ammonia 16 9 - 35 umol/L    Comment: Performed at [facility] [address] [place] Lactic acid, plasma     Status: Abnormal   Collection Time: 02/05/19 10:57 PM  Result Value Ref Range   Lactic Acid, Venous 7.2 (HH) 0.5 - 1.9 mmol/L    Comment: CRITICAL RESULT CALLED TO, READ BACK BY AND VERIFIED WITH: SANGELANG B,RN 02/05/19 2334 WAYK Performed at [facility] [address] [place] [address]  Ethanol     Status: None   Collection Time: 02/05/19 10:58 PM  Result Value Ref Range   Alcohol, Ethyl (B) <10 <10 mg/dL    Comment: (NOTE) Lowest detectable limit for serum alcohol is 10 mg/dL. For medical purposes only. Performed at [facility] [address], [place] [address]  CBC with Differential     Status: Abnormal   Collection Time: 02/05/19 10:58 PM  Result Value Ref Range   WBC 13.1 (H) 4.0 - 10.5 K/uL    Comment: WHITE COUNT CONFIRMED ON SMEAR   RBC 4.22 3.87 - 5.11 MIL/uL   Hemoglobin 13.5  12.0 - 15.0 g/dL   HCT 40.0 36.0 - 46.0 %   MCV 94.8 80.0 - 100.0 fL   MCH 32.0 26.0 - 34.0 pg   MCHC 33.8 30.0 - 36.0 g/dL   RDW 15.7 (H) 11.5 - 15.5 %   Platelets 145 (L) 150 - 400 K/uL   nRBC 0.0 0.0 - 0.2 %   Neutrophils Relative % 81 %   Neutro Abs 10.6 (H) 1.7 - 7.7 K/uL   Lymphocytes Relative 5 %   Lymphs Abs 0.7 0.7 - 4.0 K/uL   Monocytes Relative 4 %   Monocytes Absolute 0.5 0.1 - 1.0 K/uL   Eosinophils Relative 9 %   Eosinophils Absolute 1.2 (H) 0.0 - 0.5 K/uL   Basophils Relative 1 %   Basophils Absolute 0.1 0.0 - 0.1 K/uL   Immature Granulocytes 0 %   Abs Immature Granulocytes 0.04 0.00 - 0.07 K/uL    Comment: Performed at [facility] [address] [place] [id number] Comprehensive metabolic panel     Status: Abnormal   Collection Time: 02/05/19 10:58 PM  Result Value Ref Range   Sodium 140 135 - 145 mmol/L   Potassium 3.6 3.5 - 5.1 mmol/L   Chloride 99 98 - 111 mmol/L  CO2 19 (L) 22 - 32 mmol/L   Glucose, Bld 211 (H) 70 - 99 mg/dL   BUN 17 8 - 23 mg/dL   Creatinine, Ser 0.81 0.44 - 1.00 mg/dL   Calcium 9.4 8.9 - 10.3 mg/dL   Total Protein 8.2 (H) 6.5 - 8.1 g/dL   Albumin 4.8 3.5 - 5.0 g/dL   AST 42 (H) 15 - 41 U/L   ALT 15 0 - 44 U/L   Alkaline Phosphatase 68 38 - 126 U/L   Total Bilirubin 1.5 (H) 0.3 - 1.2 mg/dL   GFR calc non Af Amer >60 >60 mL/min   GFR calc Af Amer >60 >60 mL/min   Anion gap 22 (H) 5 - 15    Comment: Performed at [facility], [address] [place], [place] [address]  Magnesium     Status: None   Collection Time: 02/05/19 10:58 PM  Result Value Ref Range   Magnesium 1.7 1.7 - 2.4 mg/dL    Comment: Performed at [facility], [address] [place], [place] [address]  Troponin I (High Sensitivity)     Status: None   Collection Time: 02/05/19 10:58 PM  Result Value Ref Range   Troponin I (High Sensitivity) 4 <18 ng/L    Comment: (NOTE) Elevated high sensitivity troponin I (hsTnI) values and  significant  changes across serial measurements may suggest ACS but many other  chronic and acute conditions are known to elevate hsTnI results.  Refer to the Links section for chest pain algorithms and additional  guidance. Performed at  Hospital Lab, [address] [place], [place] [address]   POC SARS Coronavirus 2 Ag-ED - Nasal Swab (BD Veritor Kit)     Status: None   Collection Time: 02/05/19 11:58 PM  Result Value Ref Range   SARS Coronavirus 2 Ag NEGATIVE NEGATIVE    Comment: (NOTE) SARS-CoV-2 antigen NOT DETECTED.  Negative results are presumptive.  Negative results do not preclude SARS-CoV-2 infection and should not be used as the sole basis for treatment or other patient management decisions, including infection  control decisions, particularly in the presence of clinical signs and  symptoms consistent with COVID-19, or in those who have been in contact with the virus.  Negative results must be combined with clinical observations, patient history, and epidemiological information. The expected result is Negative. Fact Sheet for Patients: https://www.fda.gov/media/139754/download Fact Sheet for Healthcare Providers: https://www.fda.gov/media/139753/download This test is not yet approved or cleared by the [place] FDA and  has been authorized for detection and/or diagnosis of SARS-CoV-2 by FDA under an Emergency Use Authorization (EUA).  This EUA will remain in effect (meaning this test can be used) for the duration of  the COVID-19 de claration under Section 564(b)(1) of the Act, 21 U.S.C. section 360bbb-3(b)(1), unless the authorization is terminated or revoked sooner.     Chemistries  Recent Labs  Lab 02/05/19 2258  NA 140  K 3.6  CL 99  CO2 19*  GLUCOSE 211*  BUN 17  CREATININE 0.81  CALCIUM 9.4  MG 1.7  AST 42*  ALT 15  ALKPHOS 68  BILITOT 1.5*    ------------------------------------------------------------------------------------------------------------------  ------------------------------------------------------------------------------------------------------------------ GFR: Estimated Creatinine Clearance: 62.5 mL/min (by C-G formula based on SCr of 0.81 mg/dL). Liver Function Tests: Recent Labs  Lab 02/05/19 2258  AST 42*  ALT 15  ALKPHOS 68  BILITOT 1.5*  PROT 8.2*  ALBUMIN 4.8   No results for input(s): LIPASE, AMYLASE in the last   168 hours. Recent Labs  Lab 02/05/19 2252  AMMONIA 16   Coagulation Profile: No results for input(s): INR, PROTIME in the last 168 hours. Cardiac Enzymes: No results for input(s): CKTOTAL, CKMB, CKMBINDEX, TROPONINI in the last 168 hours. BNP (last 3 results) No results for input(s): PROBNP in the last 8760 hours. HbA1C: No results for input(s): HGBA1C in the last 72 hours. CBG: No results for input(s): GLUCAP in the last 168 hours. Lipid Profile: No results for input(s): CHOL, HDL, LDLCALC, TRIG, CHOLHDL, LDLDIRECT in the last 72 hours. Thyroid Function Tests: No results for input(s): TSH, T4TOTAL, FREET4, T3FREE, THYROIDAB in the last 72 hours. Anemia Panel: No results for input(s): VITAMINB12, FOLATE, FERRITIN, TIBC, IRON, RETICCTPCT in the last 72 hours.  --------------------------------------------------------------------------------------------------------------- Urine analysis:    Component Value Date/Time   COLORURINE YELLOW 01/04/2018 1842   APPEARANCEUR CLEAR 01/04/2018 1842   LABSPEC 1.010 01/04/2018 1842   LABSPEC 1.030 09/01/2017 1443   PHURINE 7.0 01/04/2018 1842   GLUCOSEU NEGATIVE 01/04/2018 1842   HGBUR SMALL (A) 01/04/2018 1842   BILIRUBINUR NEGATIVE 01/04/2018 1842   BILIRUBINUR moderate (A) 09/01/2017 1443   KETONESUR 5 (A) 01/04/2018 1842   PROTEINUR NEGATIVE 01/04/2018 1842   UROBILINOGEN 0.2 07/25/2007 0900   NITRITE NEGATIVE 01/04/2018 1842    LEUKOCYTESUR NEGATIVE 01/04/2018 1842      Imaging Results:    Dg Chest Portable 1 View  Result Date: 02/05/2019 CLINICAL DATA:  Shortness of breath and cough tachycardia and diaphoresis EXAM: PORTABLE CHEST 1 VIEW COMPARISON:  05/04/2018 FINDINGS: Cardiac shadow is stable. The lungs are hyperinflated consistent with COPD. No focal infiltrate is seen. Old rib fractures are noted with healing on right. No acute bony abnormality is seen. IMPRESSION: COPD without acute abnormality. Electronically Signed   By: Inez Catalina M.D.   On: 02/05/2019 23:22       Assessment & Plan:    Principal Problem:   Alcohol withdrawal (HCC) Active Problems:   Abnormal liver function  ETOH withdrawal Librium 3m po qday x 1 days then 265mpo qday x 1 day CIWA  Chest pain/ dyspnea, tachycardia Hx of cardiac catheterization 05/07/2018-> no obstructive CAD Tele Trop I  D dimer, if positive then CTA chest r/o PE Check cardiac echo     H/o SVT Cont Propranolol  Abnormal liver function Check acute hepatitis panel Check cmp in am  Gerd Cont PPI  Anxiety Cont Buspar Cont Cymbalta  Osteoporosis Hold Fosamax due to chest discomfort    DVT Prophylaxis-   Lovenox - SCDs   AM Labs Ordered, also please review Full Orders  Family Communication: Admission, patients condition and plan of care including tests being ordered have been discussed with the patient  who indicate understanding and agree with the plan and Code Status.  Code Status:  FULL CODE per patient  Admission status: Observationt: Based on patients clinical presentation and evaluation of above clinical data, I have made determination that patient meets observation criteria at this time.   Time spent in minutes : 70 minutes   JaJani Gravel.D on 02/06/2019 at 2:13 AM

## 2019-02-06 NOTE — ED Notes (Signed)
ED TO INPATIENT HANDOFF REPORT  ED Nurse Name and Phone #: Lunette Stands, Call ED  S Name/Age/Gender Terri Ayala 63 y.o. female Room/Bed: 012C/012C  Code Status   Code Status: Full Code  Home/SNF/Other Home Patient oriented to: self, place, time and situation Is this baseline? Yes   Triage Complete: Triage complete  Chief Complaint ETOH, Withdraw  Triage Note Pt BIB [facility] for eval of ETOH w/d. Pt reports she last drank this AM, pt is tachycardic, diaphoretic, N/V, myalgias. Pt appears extremely anxious on arrival, tremulous.    Allergies Allergies  Allergen Reactions  . Doxycycline Swelling    Mouth swelling and sores  . Codeine Itching  . Tetracycline Hcl Swelling    Mouth swelling and sores    Level of Care/Admitting Diagnosis ED Disposition    ED Disposition Condition [facility]: [place] [facility] [[id number]]  Level of Care: Med-Surg [16]  Covid Evaluation: Confirmed COVID Negative  Diagnosis: Alcohol withdrawal delirium ([place]) [291.0.ICD-9-CM]  Admitting Physician: Tawni Millers [[id number]]  Attending Physician: Tawni Millers [[id number]]  Estimated length of stay: 3 - 4 days  Certification:: I certify this patient will need inpatient services for at least 2 midnights  PT Class (Do Not Modify): Inpatient [101]  PT Acc Code (Do Not Modify): Private [1]       B Medical/Surgery History Past Medical History:  Diagnosis Date  . Alcohol withdrawal ([place]) 11/2017; 01/04/2018  . Alcoholism ([place])   . Anxiety   . Arthritis    "hands" (01/04/2018)  . ASYMPTOMATIC POSTMENOPAUSAL STATUS 08/17/2009  . Chronic lower back pain   . Depression   . DYSPHAGIA 06/19/2007  . Fatty liver, alcoholic   . GERD (gastroesophageal reflux disease)   . GOITER, MULTINODULAR 08/17/2009  . Hyperlipidemia   . Migraines    "not sure what triggers them; I'll have 1 q couple weeks or more; come 2 days in a row when they come" (01/04/2018)  . Mitral  valve prolapse   . OSTEOPOROSIS 08/17/2009  . PONV (postoperative nausea and vomiting)   . Supraventricular tachycardia [facility])    Past Surgical History:  Procedure Laterality Date  . BREAST BIOPSY Right    "benign"  . CATARACT EXTRACTION W/ INTRAOCULAR LENS  IMPLANT, BILATERAL    . CERVICAL CONE BIOPSY  2000s  . CERVIX LESION DESTRUCTION  1991   "dysplasia; lesions"  . CHOLECYSTECTOMY N/A 03/14/2013   Procedure: LAPAROSCOPIC CHOLECYSTECTOMY WITH ATTEMPTED INTRAOPERATIVE CHOLANGIOGRAM;  Surgeon: Harl Bowie, MD;  Location: [place];  Service: General;  Laterality: N/A;  . laporoscopic abdominal surgery     "for endometriosis"  . LEFT HEART CATH AND CORONARY ANGIOGRAPHY N/A 05/07/2018   Procedure: LEFT HEART CATH AND CORONARY ANGIOGRAPHY;  Surgeon: Belva Crome, MD;  Location: [place] [facility];  Service: Cardiovascular;  Laterality: N/A;     A IV Location/Drains/Wounds Patient Lines/Drains/Airways Status   Active Line/Drains/Airways    Name:   Placement date:   Placement time:   Site:   Days:   Peripheral IV 02/05/19 Right Antecubital   02/05/19    2252    Antecubital   1   Peripheral IV 02/06/19 Left Wrist   02/06/19    0124    Wrist   less than 1          Intake/Output Last 24 hours  Intake/Output Summary (Last 24 hours) at 02/06/2019 2019 Last data filed at 02/06/2019 1035 Gross per 24 hour  Intake  2000 ml  Output -  Net 2000 ml    Labs/Imaging Results for orders placed or performed during the hospital encounter of 02/05/19 (from the past 48 hour(s))  Ammonia     Status: None   Collection Time: 02/05/19 10:52 PM  Result Value Ref Range   Ammonia 16 9 - 35 umol/L    Comment: Performed at [facility], [address], [place], [place] [address]  Lactic acid, plasma     Status: Abnormal   Collection Time: 02/05/19 10:57 PM  Result Value Ref Range   Lactic Acid, Venous 7.2 (HH) 0.5 - 1.9 mmol/L    Comment: CRITICAL RESULT CALLED TO, READ BACK BY AND VERIFIED  WITH: SANGELANG B,RN 02/05/19 2334 WAYK Performed at [facility], [place] [address], [place], [place] [address]   Ethanol     Status: None   Collection Time: 02/05/19 10:58 PM  Result Value Ref Range   Alcohol, Ethyl (B) <10 <10 mg/dL    Comment: (NOTE) Lowest detectable limit for serum alcohol is 10 mg/dL. For medical purposes only. Performed at [facility], [place] [address], [place], [place] [address]   CBC with Differential     Status: Abnormal   Collection Time: 02/05/19 10:58 PM  Result Value Ref Range   WBC 13.1 (H) 4.0 - 10.5 K/uL    Comment: WHITE COUNT CONFIRMED ON SMEAR   RBC 4.22 3.87 - 5.11 MIL/uL   Hemoglobin 13.5 12.0 - 15.0 g/dL   HCT 40.0 36.0 - 46.0 %   MCV 94.8 80.0 - 100.0 fL   MCH 32.0 26.0 - 34.0 pg   MCHC 33.8 30.0 - 36.0 g/dL   RDW 15.7 (H) 11.5 - 15.5 %   Platelets 145 (L) 150 - 400 K/uL   nRBC 0.0 0.0 - 0.2 %   Neutrophils Relative % 81 %   Neutro Abs 10.6 (H) 1.7 - 7.7 K/uL   Lymphocytes Relative 5 %   Lymphs Abs 0.7 0.7 - 4.0 K/uL   Monocytes Relative 4 %   Monocytes Absolute 0.5 0.1 - 1.0 K/uL   Eosinophils Relative 9 %   Eosinophils Absolute 1.2 (H) 0.0 - 0.5 K/uL   Basophils Relative 1 %   Basophils Absolute 0.1 0.0 - 0.1 K/uL   Immature Granulocytes 0 %   Abs Immature Granulocytes 0.04 0.00 - 0.07 K/uL    Comment: Performed at [facility], [place] [address], [place], [place] [address]  Comprehensive metabolic panel     Status: Abnormal   Collection Time: 02/05/19 10:58 PM  Result Value Ref Range   Sodium 140 135 - 145 mmol/L   Potassium 3.6 3.5 - 5.1 mmol/L   Chloride 99 98 - 111 mmol/L   CO2 19 (L) 22 - 32 mmol/L   Glucose, Bld 211 (H) 70 - 99 mg/dL   BUN 17 8 - 23 mg/dL   Creatinine, Ser 0.81 0.44 - 1.00 mg/dL   Calcium 9.4 8.9 - 10.3 mg/dL   Total Protein 8.2 (H) 6.5 - 8.1 g/dL   Albumin 4.8 3.5 - 5.0 g/dL   AST 42 (H) 15 - 41 U/L   ALT 15 0 - 44 U/L   Alkaline Phosphatase 68 38 - 126 U/L   Total Bilirubin 1.5 (H)  0.3 - 1.2 mg/dL   GFR calc non Af Amer >60 >60 mL/min   GFR calc Af Amer >60 >60 mL/min   Anion gap 22 (H) 5 - 15  Comment: Performed at [facility], [place] [address] [place], [place] [address]  Magnesium     Status: None   Collection Time: 02/05/19 10:58 PM  Result Value Ref Range   Magnesium 1.7 1.7 - 2.4 mg/dL    Comment: Performed at [facility], [place] [address], [place], [place] [address]  Troponin I (High Sensitivity)     Status: None   Collection Time: 02/05/19 10:58 PM  Result Value Ref Range   Troponin I (High Sensitivity) 4 <18 ng/L    Comment: (NOTE) Elevated high sensitivity troponin I (hsTnI) values and significant  changes across serial measurements may suggest ACS but many other  chronic and acute conditions are known to elevate hsTnI results.  Refer to the Links section for chest pain algorithms and additional  guidance. Performed at [facility], [place] [address] [place], [place] [address]   POC SARS Coronavirus 2 Ag-ED - Nasal Swab (BD Veritor Kit)     Status: None   Collection Time: 02/05/19 11:58 PM  Result Value Ref Range   SARS Coronavirus 2 Ag NEGATIVE NEGATIVE    Comment: (NOTE) SARS-CoV-2 antigen NOT DETECTED.  Negative results are presumptive.  Negative results do not preclude SARS-CoV-2 infection and should not be used as the sole basis for treatment or other patient management decisions, including infection  control decisions, particularly in the presence of clinical signs and  symptoms consistent with COVID-19, or in those who have been in contact with the virus.  Negative results must be combined with clinical observations, patient history, and epidemiological information. The expected result is Negative. Fact Sheet for Patients: PodPark.tn Fact Sheet for Healthcare Providers: GiftContent.is This test is not yet approved or cleared by the [place] FDA and  has been  authorized for detection and/or diagnosis of SARS-CoV-2 by FDA under an Emergency Use Authorization (EUA).  This EUA will remain in effect (meaning this test can be used) for the duration of  the COVID-19 de claration under Section 564(b)(1) of the Act, 21 U.S.C. section 360bbb-3(b)(1), unless the authorization is terminated or revoked sooner.   SARS CORONAVIRUS 2 (TAT 6-24 HRS) Nasopharyngeal Nasopharyngeal Swab     Status: None   Collection Time: 02/06/19  1:58 AM   Specimen: Nasopharyngeal Swab  Result Value Ref Range   SARS Coronavirus 2 NEGATIVE NEGATIVE    Comment: (NOTE) SARS-CoV-2 target nucleic acids are NOT DETECTED. The SARS-CoV-2 RNA is generally detectable in upper and lower respiratory specimens during the acute phase of infection. Negative results do not preclude SARS-CoV-2 infection, do not rule out co-infections with other pathogens, and should not be used as the sole basis for treatment or other patient management decisions. Negative results must be combined with clinical observations, patient history, and epidemiological information. The expected result is Negative. Fact Sheet for Patients: SugarRoll.be Fact Sheet for Healthcare Providers: https://www.woods-mathews.com/ This test is not yet approved or cleared by the [place] FDA and  has been authorized for detection and/or diagnosis of SARS-CoV-2 by FDA under an Emergency Use Authorization (EUA). This EUA will remain  in effect (meaning this test can be used) for the duration of the COVID-19 declaration under Section 56 4(b)(1) of the Act, 21 U.S.C. section 360bbb-3(b)(1), unless the authorization is terminated or revoked sooner. Performed at [facility], [place] [address] [place], [place] [address]   Troponin I (High Sensitivity)     Status: None   Collection Time: 02/06/19  2:49 AM  Result Value Ref Range   Troponin I (High Sensitivity) 6 <18 ng/L     Comment: (NOTE) Elevated high sensitivity troponin I (hsTnI) values and significant  changes across serial measurements may suggest ACS but many other  chronic and acute conditions are known to elevate hsTnI results.  Refer to the "Links" section for chest pain algorithms and additional  guidance. Performed at [facility], [place] [address], [place], [place] [address]   Lactic acid, plasma     Status: Abnormal   Collection Time: 02/06/19  2:50 AM  Result Value Ref Range   Lactic Acid, Venous 2.4 (HH) 0.5 - 1.9 mmol/L    Comment: CRITICAL VALUE NOTED.  VALUE IS CONSISTENT WITH PREVIOUSLY REPORTED AND CALLED VALUE. Performed at [facility], [place] [address], [place], [place] [address]   HIV Antibody (routine testing w rflx)     Status: None   Collection Time: 02/06/19  6:08 AM  Result Value Ref Range   HIV Screen 4th Generation wRfx NON REACTIVE NON REACTIVE    Comment: Performed at [place] [address], [place], [place] [address]  Comprehensive metabolic panel     Status: Abnormal   Collection Time: 02/06/19  6:08 AM  Result Value Ref Range   Sodium 139 135 - 145 mmol/L   Potassium 3.4 (L) 3.5 - 5.1 mmol/L   Chloride 104 98 - 111 mmol/L   CO2 20 (L) 22 - 32 mmol/L   Glucose, Bld 109 (H) 70 - 99 mg/dL   BUN 17 8 - 23 mg/dL   Creatinine, Ser 0.64 0.44 - 1.00 mg/dL   Calcium 8.4 (L) 8.9 - 10.3 mg/dL   Total Protein 6.7 6.5 - 8.1 g/dL   Albumin 4.0 3.5 - 5.0 g/dL   AST 31 15 - 41 U/L   ALT 13 0 - 44 U/L   Alkaline Phosphatase 52 38 - 126 U/L   Total Bilirubin 1.4 (H) 0.3 - 1.2 mg/dL   GFR calc non Af Amer >60 >60 mL/min   GFR calc Af Amer >60 >60 mL/min   Anion gap 15 5 - 15    Comment: Performed at [facility] [place] [address] [place], [place] [address]  CBC     Status: Abnormal   Collection Time: 02/06/19  6:08 AM  Result Value Ref Range   WBC 10.7 (H) 4.0 - 10.5 K/uL   RBC 3.69 (L) 3.87 - 5.11 MIL/uL   Hemoglobin 11.8 (L) 12.0 - 15.0 g/dL    HCT 36.3 36.0 - 46.0 %   MCV 98.4 80.0 - 100.0 fL   MCH 32.0 26.0 - 34.0 pg   MCHC 32.5 30.0 - 36.0 g/dL   RDW 15.6 (H) 11.5 - 15.5 %   Platelets PLATELET CLUMPS NOTED ON SMEAR, UNABLE TO ESTIMATE 150 - 400 K/uL    Comment: Immature Platelet Fraction may be clinically indicated, consider ordering this additional test [id number]    nRBC 0.0 0.0 - 0.2 %    Comment: Performed at [facility], [place] [address] [place], [place] [address]  Hepatitis panel, acute     Status: None   Collection Time: 02/06/19  6:08 AM  Result Value Ref Range   Hepatitis B Surface Ag NON REACTIVE NON REACTIVE   HCV Ab NON REACTIVE NON REACTIVE    Comment: (NOTE) Nonreactive HCV antibody screen is consistent with no HCV infections,  unless recent infection is suspected or other evidence exists  to indicate HCV infection.    Hep A IgM NON REACTIVE NON REACTIVE   Hep B C IgM NON REACTIVE NON REACTIVE    Comment: Performed at [facility], [place] [address], [place], [place] [address]  Lipase, blood     Status: None   Collection Time: 02/06/19  6:08 AM  Result Value Ref Range   Lipase 50 11 - 51 U/L    Comment: Performed at [facility] [address], [place], [place] [address]  Rapid urine drug screen (hospital performed)     Status: Abnormal   Collection Time: 02/06/19  6:16 AM  Result Value Ref Range   Opiates NONE DETECTED NONE DETECTED   Cocaine NONE DETECTED NONE DETECTED   Benzodiazepines POSITIVE (A) NONE DETECTED   Amphetamines NONE DETECTED NONE DETECTED   Tetrahydrocannabinol NONE DETECTED NONE DETECTED   Barbiturates NONE DETECTED NONE DETECTED    Comment: (NOTE) DRUG SCREEN FOR MEDICAL PURPOSES ONLY.  IF CONFIRMATION IS NEEDED FOR ANY PURPOSE, NOTIFY LAB WITHIN 5 DAYS. LOWEST DETECTABLE LIMITS FOR URINE DRUG SCREEN Drug Class                     Cutoff (ng/mL) Amphetamine and metabolites    1000 Barbiturate and metabolites    200 Benzodiazepine                  073 Tricyclics and metabolites     300 Opiates and metabolites        300 Cocaine and metabolites        300 THC                            50 Performed at [facility], [place] [address], [place], [place] [address]   Urinalysis, Routine w reflex microscopic     Status: Abnormal   Collection Time: 02/06/19  6:16 AM  Result Value Ref Range   Color, Urine YELLOW YELLOW   APPearance CLEAR CLEAR   Specific Gravity, Urine 1.021 1.005 - 1.030   pH 8.0 5.0 - 8.0   Glucose, UA 150 (A) NEGATIVE mg/dL   Hgb urine dipstick NEGATIVE NEGATIVE   Bilirubin Urine NEGATIVE NEGATIVE   Ketones, ur 20 (A) NEGATIVE mg/dL   Protein, ur 100 (A) NEGATIVE mg/dL   Nitrite NEGATIVE NEGATIVE   Leukocytes,Ua NEGATIVE NEGATIVE   RBC / HPF 0-5 0 - 5 RBC/hpf   WBC, UA 0-5 0 - 5 WBC/hpf   Bacteria, UA RARE (A) NONE SEEN   Squamous Epithelial / LPF 0-5 0 - 5   Mucus PRESENT     Comment: Performed at [facility], [address] [place], [place] [address]   Dg Chest Portable 1 View  Result Date: 02/05/2019 CLINICAL DATA:  Shortness of breath and cough tachycardia and diaphoresis EXAM: PORTABLE CHEST 1 VIEW COMPARISON:  05/04/2018 FINDINGS: Cardiac shadow is stable. The lungs are hyperinflated consistent with COPD. No focal infiltrate is seen. Old rib fractures are noted with healing on right. No acute bony abnormality is seen. IMPRESSION: COPD without acute abnormality. Electronically Signed   By: Inez Catalina M.D.   On: 02/05/2019 23:22    Pending Labs Unresulted Labs (From admission, onward)    Start     Ordered   02/13/19 0500  Creatinine, serum  (enoxaparin (LOVENOX)    CrCl >/= 30 ml/min)  Weekly,   R  Comments: while on enoxaparin therapy    02/06/19 0150          Vitals/Pain Today's Vitals   02/06/19 1845 02/06/19 1855 02/06/19 1915 02/06/19 1945  BP: 133/80  137/90 (!) 133/96  Pulse: 85  79 81  Resp: 17  18   Temp:      TempSrc:      SpO2: 98%  98% 99%  Weight:      Height:       PainSc:  0-No pain      Isolation Precautions No active isolations  Medications Medications  enoxaparin (LOVENOX) injection 40 mg (40 mg Subcutaneous Given 02/06/19 1035)  aspirin EC tablet 81 mg (81 mg Oral Given 02/06/19 1032)  propranolol (INDERAL) tablet 10 mg (10 mg Oral Given 02/06/19 1031)  busPIRone (BUSPAR) tablet 15 mg (15 mg Oral Given 02/06/19 1549)  DULoxetine (CYMBALTA) DR capsule 60 mg (60 mg Oral Given 02/06/19 1031)  hydrOXYzine (ATARAX/VISTARIL) tablet 25 mg (25 mg Oral Given 02/06/19 1031)  traZODone (DESYREL) tablet 50 mg (has no administration in time range)  norethindrone-ethinyl estradiol (FEMHRT 1/5) 1-5 MG-MCG per tablet 1 tablet (1 tablet Oral Not Given 02/06/19 1109)  pantoprazole (PROTONIX) EC tablet 40 mg (40 mg Oral Given 02/06/19 1031)  multivitamin with minerals tablet 1 tablet (1 tablet Oral Given 02/06/19 1038)  ondansetron (ZOFRAN) injection 4 mg (has no administration in time range)  LORazepam (ATIVAN) tablet 1-4 mg (has no administration in time range)    Or  LORazepam (ATIVAN) injection 1-4 mg (has no administration in time range)  thiamine (VITAMIN B-1) tablet 100 mg (100 mg Oral Given 02/06/19 1031)    Or  thiamine (B-1) injection 100 mg ( Intravenous See Alternative 86/1/68 3729)  folic acid (FOLVITE) tablet 1 mg (1 mg Oral Given 02/06/19 1038)  multivitamin with minerals tablet 1 tablet (1 tablet Oral Not Given 02/06/19 1039)  LORazepam (ATIVAN) injection 0-4 mg (1 mg Intravenous Given 02/06/19 1809)    Followed by  LORazepam (ATIVAN) injection 0-4 mg (has no administration in time range)  chlordiazePOXIDE (LIBRIUM) capsule 25 mg (has no administration in time range)  LORazepam (ATIVAN) injection 2 mg (2 mg Intravenous Given 02/05/19 2301)  aspirin EC tablet 325 mg (325 mg Oral Given 02/05/19 2313)  dextrose 5 % and 0.45% NaCl 1,000 mL with thiamine 021 mg, folic acid 1 mg, multivitamins adult 10 mL, magnesium sulfate 2 g infusion ( Intravenous Stopped  02/06/19 1035)  sodium chloride 0.9 % bolus 1,000 mL (0 mLs Intravenous Stopped 02/06/19 0222)  chlordiazePOXIDE (LIBRIUM) capsule 50 mg (50 mg Oral Given 02/06/19 0217)    Mobility walks High fall risk   Focused Assessments Neuro Assessment Handoff:  Swallow screen pass?N/A Cardiac Rhythm: Sinus tachycardia       Neuro Assessment: Within Defined Limits Neuro Checks:      Last Documented NIHSS Modified Score:   Has TPA been given? No If patient is a Neuro Trauma and patient is going to OR before floor call report to [place] nurse: [phone] or [phone]     R Recommendations: See Admitting Provider Note  Report given to:   Additional Notes: N/A

## 2019-02-06 NOTE — ED Notes (Signed)
Called to give report to receiving RN; RN handling another admission; will attempt again in 5-10 minutes.

## 2019-02-07 NOTE — Progress Notes (Signed)
PROGRESS NOTE    Terri Ayala  [id number] DOB: 02-04-1956 DOA: 02/05/2019 PCP: Esaw Grandchild, NP    Brief Narrative:  63 year old female who presented from home with alcohol withdrawal symptoms.  She does have significant past medical history of SVT, alcoholism, who apparently stopped drinking alcohol 24 hours prior to hospitalization.  She has experienced palpitations, chest pressure and dyspnea.  On her initial physical examination her pulse rate was 119, respiratory rate 16, blood pressure 164/96, oxygen saturation 100% on room air.  Her lungs were clear to auscultation bilaterally, heart S1-S2 present rhythmic, abdomen soft, no lower extremity edema. Sodium 140, potassium 3.6, chloride 99, bicarb 19, glucose 211, BUN 17, creatinine 0.81, magnesium 1.7, white count 13.1, hemoglobin 13.5, hematocrit 40.0, platelets 145.  SARS COVID-19 negative.  Urinalysis 0-5 white, specific gravity 1.021, 100 protein.  Alcohol level less than 10, positive benzodiazepines.  Chest radiograph positive hyperinflation, no infiltrates.  EKG 119 bpm, normal axis, normal intervals, sinus rhythm, no ST segment T wave changes.  Patient has been admitted to the hospital with working diagnosis of alcohol withdrawal syndrome  Slowly has been improving with decreased requirements of lorazepam, continue to be very weak and deconditioned.   Assessment & Plan:   Principal Problem:   Alcohol withdrawal ([place]) Active Problems:   Abnormal liver function   Alcohol withdrawal delirium ([place])   1. Acute alcohol withdrawal syndrome. Clinically more awake but continue to have tremors. Low appetite. Used 25 mg of chlordiazepoxide this am at 0:500 and then 1 mg IV lorazepam at 09:58.  Will continue with benzodiazepines per CIWA protocol, neuro checks q 4 H and aspiration precautions.  On thiamine and vitamins. Continue close monitoring, will assess the need for a more long acting benzodiazepine depending on patients symptoms  through the course of the day.   Old records personally reviewed, recent hospitalization for alcohol withdrawal in 04/2018.   2. History of SVT: Continue propranolol and trazodone.   Consult physical therapy and occupation therapy, patient lives at home alone.   2. Depression. On buspirone and duloxetine, with no complications   DVT prophylaxis: enoxaparin   Code Status:  full Family Communication: no family at the bedside  Disposition Plan/ discharge barriers:  Pending clinical improvement.    Body mass index is 23.23 kg/m. Malnutrition Type:      Malnutrition Characteristics:      Nutrition Interventions:     RN Pressure Injury Documentation:     Consultants:     Procedures:     Antimicrobials:       Subjective: Patient continue to be very weak and deconditioned, continue to have tremors and anxiety, poor appetite, no nausea or vomiting.   Objective: Vitals:   02/06/19 2121 02/07/19 0012 02/07/19 0348 02/07/19 0749  BP: 129/82 (!) 143/97 (!) 138/91 131/79  Pulse: 88 80 90 93  Resp: 17 19 18 18   Temp: 97.9 F (36.6 C) 98 F (36.7 C) 97.8 F (36.6 C) 98.7 F (37.1 C)  TempSrc: Oral Oral Oral Oral  SpO2: 99% 98% 99% 98%  Weight: 57.6 kg     Height: 5\' 2"  (1.575 m)       Intake/Output Summary (Last 24 hours) at 02/07/2019 1200 Last data filed at 02/07/2019 0900 Gross per 24 hour  Intake 600 ml  Output -  Net 600 ml   Filed Weights   02/05/19 2249 02/06/19 2121  Weight: 65.8 kg 57.6 kg    Examination:   General: deconditioned  Neurology:  Awake and alert, decreased hearing, positive tremors in her hands.  E ENT: mild pallor, no icterus, oral mucosa moist Cardiovascular: No JVD. S1-S2 present, rhythmic, no gallops, rubs, or murmurs. No lower extremity edema. Pulmonary: positive breath sounds bilaterally, adequate air movement, no wheezing, rhonchi or rales. Gastrointestinal. Abdomen with no organomegaly, non tender, no rebound or  guarding Skin. No rashes Musculoskeletal: no joint deformities     Data Reviewed: I have personally reviewed following labs and imaging studies  CBC: Recent Labs  Lab 02/05/19 2258 02/06/19 0608  WBC 13.1* 10.7*  NEUTROABS 10.6*  --   HGB 13.5 11.8*  HCT 40.0 36.3  MCV 94.8 98.4  PLT 145* PLATELET CLUMPS NOTED ON SMEAR, UNABLE TO ESTIMATE   Basic Metabolic Panel: Recent Labs  Lab 02/05/19 2258 02/06/19 0608  NA 140 139  K 3.6 3.4*  CL 99 104  CO2 19* 20*  GLUCOSE 211* 109*  BUN 17 17  CREATININE 0.81 0.64  CALCIUM 9.4 8.4*  MG 1.7  --    GFR: Estimated Creatinine Clearance: 56.2 mL/min (by C-G formula based on SCr of 0.64 mg/dL). Liver Function Tests: Recent Labs  Lab 02/05/19 2258 02/06/19 0608  AST 42* 31  ALT 15 13  ALKPHOS 68 52  BILITOT 1.5* 1.4*  PROT 8.2* 6.7  ALBUMIN 4.8 4.0   Recent Labs  Lab 02/06/19 0608  LIPASE 50   Recent Labs  Lab 02/05/19 2252  AMMONIA 16   Coagulation Profile: No results for input(s): INR, PROTIME in the last 168 hours. Cardiac Enzymes: No results for input(s): CKTOTAL, CKMB, CKMBINDEX, TROPONINI in the last 168 hours. BNP (last 3 results) No results for input(s): PROBNP in the last 8760 hours. HbA1C: No results for input(s): HGBA1C in the last 72 hours. CBG: No results for input(s): GLUCAP in the last 168 hours. Lipid Profile: No results for input(s): CHOL, HDL, LDLCALC, TRIG, CHOLHDL, LDLDIRECT in the last 72 hours. Thyroid Function Tests: No results for input(s): TSH, T4TOTAL, FREET4, T3FREE, THYROIDAB in the last 72 hours. Anemia Panel: No results for input(s): VITAMINB12, FOLATE, FERRITIN, TIBC, IRON, RETICCTPCT in the last 72 hours.    Radiology Studies: I have reviewed all of the imaging during this hospital visit personally     Scheduled Meds: . aspirin EC  81 mg Oral Daily  . busPIRone  15 mg Oral TID  . DULoxetine  60 mg Oral BID  . enoxaparin (LOVENOX) injection  40 mg Subcutaneous  Daily  . folic acid  1 mg Oral Daily  . hydrOXYzine  25 mg Oral BID  . LORazepam  0-4 mg Intravenous Q4H   Followed by  . [START ON 02/08/2019] LORazepam  0-4 mg Intravenous Q8H  . multivitamin with minerals  1 tablet Oral Daily  . norethindrone-ethinyl estradiol  1 tablet Oral Daily  . pantoprazole  40 mg Oral BID  . propranolol  10 mg Oral BID  . thiamine  100 mg Oral Daily   Or  . thiamine  100 mg Intravenous Daily   Continuous Infusions:   LOS: 1 day        Hind Chesler Gerome Apley, MD

## 2019-02-08 LAB — BASIC METABOLIC PANEL
Anion gap: 9 (ref 5–15)
BUN: 15 mg/dL (ref 8–23)
CO2: 25 mmol/L (ref 22–32)
Calcium: 8.5 mg/dL — ABNORMAL LOW (ref 8.9–10.3)
Chloride: 106 mmol/L (ref 98–111)
Creatinine, Ser: 0.75 mg/dL (ref 0.44–1.00)
GFR calc Af Amer: >60 mL/min (ref >60)
GFR calc non Af Amer: >60 mL/min (ref >60)
Glucose, Bld: 97 mg/dL (ref 70–99)
Potassium: 3.4 mmol/L — ABNORMAL LOW (ref 3.5–5.1)
Sodium: 140 mmol/L (ref 135–145)

## 2019-02-08 LAB — MAGNESIUM: Magnesium: 2.2 mg/dL (ref 1.7–2.4)

## 2019-02-08 MED ORDER — CHLORDIAZEPOXIDE HCL 10 MG PO CAPS: 10.0000 mg | ORAL_CAPSULE | Freq: Every day | ORAL | 0 refills | Status: AC

## 2019-02-08 MED ORDER — FOLIC ACID 1 MG PO TABS: 1.0000 mg | ORAL_TABLET | Freq: Every day | ORAL | 0 refills | Status: DC

## 2019-02-08 MED ORDER — THIAMINE HCL 100 MG PO TABS: 100.0000 mg | ORAL_TABLET | Freq: Every day | ORAL | 0 refills | Status: DC

## 2019-02-08 MED ORDER — CHLORDIAZEPOXIDE HCL 5 MG PO CAPS
10.0000 mg | ORAL_CAPSULE | Freq: Every day | ORAL | Status: DC
  Administered 2019-02-08: 10 mg via ORAL
  Filled 2019-02-08: qty 2

## 2019-02-08 MED ORDER — POTASSIUM CHLORIDE CRYS ER 20 MEQ PO TBCR
40.0000 meq | EXTENDED_RELEASE_TABLET | Freq: Two times a day (BID) | ORAL | Status: DC
  Administered 2019-02-08: 40 meq via ORAL
  Filled 2019-02-08: qty 2

## 2019-02-08 NOTE — Evaluation (Signed)
Occupational Therapy Evaluation Patient Details Name: Terri Ayala MRN: [id number] DOB: 18-Dec-1955 Today's Date: 02/08/2019    History of Present Illness Pt is a 63 y.o. F with significant PMH of SVT, alcoholism, osteoporosis who presents with alcohol withdrawal symptoms including palpitations, chest pressure and dyspnea.   Clinical Impression   PTA pt living alone and functioning independently in the community. At time of eval, she is able to complete transfers at independent level as well as BADL. Pt does have slight higher level cognitive deficits noted, but is functional at this time. Suspect more psychosocial involvement and substance use at play. Will follow her acute for higher level cognitive assessment, but no OT follow up warranted.     Follow Up Recommendations  No OT follow up    Equipment Recommendations  None recommended by OT    Recommendations for Other Services       Precautions / Restrictions Precautions Precautions: Fall Restrictions Weight Bearing Restrictions: No      Mobility Bed Mobility Overal bed mobility: Independent                Transfers Overall transfer level: Independent Equipment used: None             General transfer comment: no DME, completes all transfers without difficulty    Balance Overall balance assessment: Mild deficits observed, not formally tested                                         ADL either performed or assessed with clinical judgement   ADL Overall ADL's : Modified independent;At baseline                                       General ADL Comments: pt presents with abilty to complete BADL at mod I- independent level and is at baseline for BADL function. No DME needed     Vision Baseline Vision/History: No visual deficits Patient Visual Report: No change from baseline Vision Assessment?: No apparent visual deficits     Perception     Praxis      Pertinent  Vitals/Pain Pain Assessment: Faces Pain Score: 5  Faces Pain Scale: Hurts little more Pain Location: low back Pain Descriptors / Indicators: Aching Pain Intervention(s): Monitored during session     Hand Dominance     Extremity/Trunk Assessment Upper Extremity Assessment Upper Extremity Assessment: Overall WFL for tasks assessed   Lower Extremity Assessment Lower Extremity Assessment: Overall WFL for tasks assessed RLE Deficits / Details: Strength 5/5 LLE Deficits / Details: Strength 5/5   Cervical / Trunk Assessment Cervical / Trunk Assessment: Kyphotic   Communication Communication Communication: HOH   Cognition Arousal/Alertness: Awake/alert Behavior During Therapy: WFL for tasks assessed/performed Overall Cognitive Status: Within Functional Limits for tasks assessed                                 General Comments: cognition appears mostly intact, suspect more psychosocial deficits at this time as well as some impaired cognition due to substance use involvement.   General Comments       Exercises     Shoulder Instructions      Home Living Family/patient expects to be discharged to:: Private residence Living Arrangements:  Alone Available Help at Discharge: Friend(s);Available PRN/intermittently Type of Home: House Home Access: Stairs to enter [employer] of Steps: 1 Entrance Stairs-Rails: None Home Layout: Two level Alternate Level Stairs-Number of Steps: 14 Alternate Level Stairs-Rails: Right Bathroom Shower/Tub: Teacher, early years/pre: Standard     Home Equipment: Environmental consultant - 2 wheels;Cane - single point;Bedside commode;Shower seat          Prior Functioning/Environment Level of Independence: Independent        Comments: Community ambulatory; enjoys walking her dog        OT Problem List: Decreased knowledge of use of DME or AE;Decreased activity tolerance;Decreased cognition;Impaired balance (sitting and/or  standing)      OT Treatment/Interventions: Self-care/ADL training;Therapeutic exercise;Patient/family education;Balance training;Therapeutic activities;DME and/or AE instruction;Cognitive remediation/compensation    OT Goals(Current goals can be found in the care plan section) Acute Rehab OT Goals Patient Stated Goal: "walk my dog." OT Goal Formulation: With patient Time For Goal Achievement: 02/22/19 Potential to Achieve Goals: Good  OT Frequency: Min 1X/week   Barriers to D/C:            Co-evaluation              AM-PAC OT "6 Clicks" Daily Activity     Outcome Measure Help from another person eating meals?: None Help from another person taking care of personal grooming?: None Help from another person toileting, which includes using toliet, bedpan, or urinal?: None Help from another person bathing (including washing, rinsing, drying)?: None Help from another person to put on and taking off regular upper body clothing?: None Help from another person to put on and taking off regular lower body clothing?: None 6 Click Score: 24   End of Session Equipment Utilized During Treatment: Gait belt Nurse Communication: Mobility status  Activity Tolerance: Patient tolerated treatment well Patient left: in bed;with call bell/phone within reach;with bed alarm set  OT Visit Diagnosis: Other abnormalities of gait and mobility (R26.89)                Time: [id number] OT Time Calculation (min): 8 min Charges:  OT General Charges $OT Visit: 1 Visit OT Evaluation $OT Eval Low Complexity: 1 Low  Zenovia Jarred, MSOT, OTR/L Behavioral Health OT/ Acute Relief OT [facility] Office: [place] 02/08/2019, 11:46 AM

## 2019-02-08 NOTE — Discharge Summary (Addendum)
Physician Discharge Summary  Terri Ayala [id number] DOB: 04/21/1955 DOA: 02/05/2019  PCP: Esaw Grandchild, NP  Admit date: 02/05/2019 Discharge date: 02/08/2019  Admitted From: Home  Disposition: Home   Recommendations for Outpatient Follow-up:  1. Follow up with PCP in 1-2 weeks 2. Please obtain BMP/CBC in one week 3. Encourage alcohol cessation.   Home Health: none  Discharge Condition: Stable.  CODE STATUS: Full code Diet recommendation: Heart Healthy   Brief/Interim Summary: 64 year old female who presented from home with alcohol withdrawal symptoms. She does have significant past medical history of SVT, alcoholism, who apparently stopped drinking alcohol 24 hours prior to hospitalization. She has experienced palpitations, chest pressure and dyspnea. On admission Sodium 140, potassium 3.6, chloride 99, bicarb 19, glucose 211, BUN 17, creatinine 0.81,magnesium 1.7, white count 13.1, hemoglobin 13.5, hematocrit 40.0, platelets 145.SARS COVID-19 negative.Urinalysis 0-5 white,specific gravity 1.021, 100 protein. Alcohol level less than 10, positive benzodiazepines.Chest radiograph positive hyperinflation, no infiltrates. EKG 119 bpm, normal axis, normal intervals,sinus rhythm, no ST segment T wave changes.  Patient has been admitted to the hospital with working diagnosis of alcohol withdrawal syndrome  Slowly has been improving with decreased requirements of lorazepam, continue to be very weak and deconditioned.   1. Acute alcohol withdrawal syndrome:  Patient was admitted with alcohol withdrawal, she presented with tremors, palpitation, chest pain.  She was treated with Ativan IV.  She received 25 mg of chlordiazepoxide yesterday.  Last dose of Ativan was last night. On thiamine and vitamins.  -Patient feel better and wishes to go home.  She has not required Ativan since last night.  Plan to discharge home if she remains stable this afternoon. -Social worker consulted  to help provide resources for alcohol cessation -Will discharge on librium for 2 days.   2. History of SVT: Continue propranolol and trazodone.   3. Depression. On buspirone and duloxetine, with no complications 4. Chest pain: Resolved suspect related to alcohol withdrawal anxiety.  Troponin negative. 5-Lactic acidosis; related to alcohol.  Normalized.  Received IV fluids. 6-Hypokalemia; replete orally  Discharge Diagnoses:  Principal Problem:   Alcohol withdrawal ([place]) Active Problems:   Abnormal liver function   Alcohol withdrawal delirium [facility])    Discharge Instructions  Discharge Instructions    Diet - low sodium heart healthy   Complete by: As directed    Increase activity slowly   Complete by: As directed      Allergies as of 02/08/2019      Reactions   Doxycycline Swelling   Mouth swelling and sores   Codeine Itching   Tetracycline Hcl Swelling   Mouth swelling and sores      Medication List    TAKE these medications   alendronate 70 MG tablet Commonly known as: FOSAMAX Take 70 mg by mouth every Monday. Take with a full glass of water on an empty stomach.   aspirin EC 81 MG tablet Take 81 mg by mouth daily.   busPIRone 15 MG tablet Commonly known as: BUSPAR Take 15 mg by mouth 3 (three) times daily.   chlordiazePOXIDE 10 MG capsule Commonly known as: LIBRIUM Take 1 capsule (10 mg total) by mouth daily for 2 days.   DULoxetine 60 MG capsule Commonly known as: CYMBALTA TAKE 1 CAPSULE (60 MG TOTAL) BY MOUTH DAILY. What changed: See the new instructions.   folic acid 1 MG tablet Commonly known as: FOLVITE Take 1 tablet (1 mg total) by mouth daily. Start taking on: February 09, 2019  hydrOXYzine 25 MG tablet Commonly known as: ATARAX/VISTARIL Take 25 mg by mouth 2 (two) times daily.   multivitamin with minerals Tabs tablet Take 1 tablet by mouth daily.   norethindrone-ethinyl estradiol 1-5 MG-MCG Tabs tablet Commonly known as: FEMHRT  1/5 Take 1 tablet by mouth daily. For Osteoporosis   ondansetron 4 MG disintegrating tablet Commonly known as: Zofran ODT Take 1 tablet (4 mg total) by mouth every 8 (eight) hours as needed for nausea or vomiting.   pantoprazole 40 MG tablet Commonly known as: PROTONIX TAKE 1 TABLET BY MOUTH 2 TIMES DAILY.   propranolol 10 MG tablet Commonly known as: INDERAL TAKE 1 TABLET BY MOUTH 2 TIMES DAILY.   SUMAtriptan 100 MG tablet Commonly known as: IMITREX Take 1 tablet at the first sign of migraine.  May repeat in 2 hours.   thiamine 100 MG tablet Take 1 tablet (100 mg total) by mouth daily. Start taking on: February 09, 2019   traZODone 50 MG tablet Commonly known as: DESYREL Take 50 mg by mouth at bedtime as needed for sleep.       Allergies  Allergen Reactions  . Doxycycline Swelling    Mouth swelling and sores  . Codeine Itching  . Tetracycline Hcl Swelling    Mouth swelling and sores    Consultations:  None   Procedures/Studies: DG Chest Portable 1 View  Result Date: 02/05/2019 CLINICAL DATA:  Shortness of breath and cough tachycardia and diaphoresis EXAM: PORTABLE CHEST 1 VIEW COMPARISON:  05/04/2018 FINDINGS: Cardiac shadow is stable. The lungs are hyperinflated consistent with COPD. No focal infiltrate is seen. Old rib fractures are noted with healing on right. No acute bony abnormality is seen. IMPRESSION: COPD without acute abnormality. Electronically Signed   By: Inez Catalina M.D.   On: 02/05/2019 23:22     Subjective: Patient is alert and oriented x3.  She does not have significant tremors.  She feels well.  She wishes to go home today.  She is willing to speak with social worker in regards resources for alcohol cessation. Discharge Exam: Vitals:   02/08/19 0900 02/08/19 1206  BP: 136/70 114/73  Pulse: 88 71  Resp:  18  Temp:  98.4 F (36.9 C)  SpO2:  98%     General: Pt is alert, awake, not in acute distress Cardiovascular: RRR, S1/S2 +, no  rubs, no gallops Respiratory: CTA bilaterally, no wheezing, no rhonchi Abdominal: Soft, NT, ND, bowel sounds + Extremities: no edema, no cyanosis    The results of significant diagnostics from this hospitalization (including imaging, microbiology, ancillary and laboratory) are listed below for reference.     Microbiology: Recent Results (from the past 240 hour(s))  SARS CORONAVIRUS 2 (TAT 6-24 HRS) Nasopharyngeal Nasopharyngeal Swab     Status: None   Collection Time: 02/06/19  1:58 AM   Specimen: Nasopharyngeal Swab  Result Value Ref Range Status   SARS Coronavirus 2 NEGATIVE NEGATIVE Final    Comment: (NOTE) SARS-CoV-2 target nucleic acids are NOT DETECTED. The SARS-CoV-2 RNA is generally detectable in upper and lower respiratory specimens during the acute phase of infection. Negative results do not preclude SARS-CoV-2 infection, do not rule out co-infections with other pathogens, and should not be used as the sole basis for treatment or other patient management decisions. Negative results must be combined with clinical observations, patient history, and epidemiological information. The expected result is Negative. Fact Sheet for Patients: SugarRoll.be Fact Sheet for Healthcare Providers: https://www.woods-mathews.com/ This test is not yet approved  or cleared by the [place] and  has been authorized for detection and/or diagnosis of SARS-CoV-2 by FDA under an Emergency Use Authorization (EUA). This EUA will remain  in effect (meaning this test can be used) for the duration of the COVID-19 declaration under Section 56 4(b)(1) of the Act, 21 U.S.C. section 360bbb-3(b)(1), unless the authorization is terminated or revoked sooner. Performed at [facility], [place] [address] [place], [place] [address]      Labs: BNP (last 3 results) No results for input(s): BNP in the last 8760 hours. Basic Metabolic Panel: Recent Labs   Lab 02/05/19 2258 02/06/19 0608 02/08/19 0235  NA 140 139 140  K 3.6 3.4* 3.4*  CL 99 104 106  CO2 19* 20* 25  GLUCOSE 211* 109* 97  BUN 17 17 15   CREATININE 0.81 0.64 0.75  CALCIUM 9.4 8.4* 8.5*  MG 1.7  --  2.2   Liver Function Tests: Recent Labs  Lab 02/05/19 2258 02/06/19 0608  AST 42* 31  ALT 15 13  ALKPHOS 68 52  BILITOT 1.5* 1.4*  PROT 8.2* 6.7  ALBUMIN 4.8 4.0   Recent Labs  Lab 02/06/19 0608  LIPASE 50   Recent Labs  Lab 02/05/19 2252  AMMONIA 16   CBC: Recent Labs  Lab 02/05/19 2258 02/06/19 0608  WBC 13.1* 10.7*  NEUTROABS 10.6*  --   HGB 13.5 11.8*  HCT 40.0 36.3  MCV 94.8 98.4  PLT 145* PLATELET CLUMPS NOTED ON SMEAR, UNABLE TO ESTIMATE   Cardiac Enzymes: No results for input(s): CKTOTAL, CKMB, CKMBINDEX, TROPONINI in the last 168 hours. BNP: Invalid input(s): POCBNP CBG: No results for input(s): GLUCAP in the last 168 hours. D-Dimer No results for input(s): DDIMER in the last 72 hours. Hgb A1c No results for input(s): HGBA1C in the last 72 hours. Lipid Profile No results for input(s): CHOL, HDL, LDLCALC, TRIG, CHOLHDL, LDLDIRECT in the last 72 hours. Thyroid function studies No results for input(s): TSH, T4TOTAL, T3FREE, THYROIDAB in the last 72 hours.  Invalid input(s): FREET3 Anemia work up No results for input(s): VITAMINB12, FOLATE, FERRITIN, TIBC, IRON, RETICCTPCT in the last 72 hours. Urinalysis    Component Value Date/Time   COLORURINE YELLOW 02/06/2019 0616   APPEARANCEUR CLEAR 02/06/2019 0616   LABSPEC 1.021 02/06/2019 0616   LABSPEC 1.030 09/01/2017 1443   PHURINE 8.0 02/06/2019 0616   GLUCOSEU 150 (A) 02/06/2019 0616   HGBUR NEGATIVE 02/06/2019 0616   BILIRUBINUR NEGATIVE 02/06/2019 0616   BILIRUBINUR moderate (A) 09/01/2017 1443   KETONESUR 20 (A) 02/06/2019 0616   PROTEINUR 100 (A) 02/06/2019 0616   UROBILINOGEN 0.2 07/25/2007 0900   NITRITE NEGATIVE 02/06/2019 0616   LEUKOCYTESUR NEGATIVE 02/06/2019 0616    Sepsis Labs Invalid input(s): PROCALCITONIN,  WBC,  LACTICIDVEN Microbiology Recent Results (from the past 240 hour(s))  SARS CORONAVIRUS 2 (TAT 6-24 HRS) Nasopharyngeal Nasopharyngeal Swab     Status: None   Collection Time: 02/06/19  1:58 AM   Specimen: Nasopharyngeal Swab  Result Value Ref Range Status   SARS Coronavirus 2 NEGATIVE NEGATIVE Final    Comment: (NOTE) SARS-CoV-2 target nucleic acids are NOT DETECTED. The SARS-CoV-2 RNA is generally detectable in upper and lower respiratory specimens during the acute phase of infection. Negative results do not preclude SARS-CoV-2 infection, do not rule out co-infections with other pathogens, and should not be used as the sole basis for treatment or other patient management decisions. Negative results must be combined with clinical observations, patient  history, and epidemiological information. The expected result is Negative. Fact Sheet for Patients: SugarRoll.be Fact Sheet for Healthcare Providers: https://www.woods-mathews.com/ This test is not yet approved or cleared by the [place] FDA and  has been authorized for detection and/or diagnosis of SARS-CoV-2 by FDA under an Emergency Use Authorization (EUA). This EUA will remain  in effect (meaning this test can be used) for the duration of the COVID-19 declaration under Section 56 4(b)(1) of the Act, 21 U.S.C. section 360bbb-3(b)(1), unless the authorization is terminated or revoked sooner. Performed at [facility], [place] [address], [place], [place] [address]      Time coordinating discharge: 40 minutes  SIGNED:   Elmarie Shiley, MD  [facility]

## 2019-02-08 NOTE — Evaluation (Addendum)
Physical Therapy Evaluation Patient Details Name: Terri Ayala MRN: [id number] DOB: 1955/11/18 Today's Date: 02/08/2019   History of Present Illness  Pt is a 62 y.o. F with significant PMH of SVT, alcoholism, osteoporosis who presents with alcohol withdrawal symptoms including palpitations, chest pressure and dyspnea.  Clinical Impression  Pt admitted with above. On PT evaluation, pt appears to be close to her functional baseline. No ataxia or tremors noted. Pt ambulating 300 feet with no assistive device without physical difficulty. HR 105-115 bpm. Displays decreased gait speed for age and dynamic balance impairments. Will continue to follow acutely; no PT follow up warranted.    Follow Up Recommendations No PT follow up;Supervision - Intermittent    Equipment Recommendations  None recommended by PT    Recommendations for Other Services       Precautions / Restrictions Precautions Precautions: Fall Restrictions Weight Bearing Restrictions: No      Mobility  Bed Mobility Overal bed mobility: Independent                Transfers Overall transfer level: Independent Equipment used: None                Ambulation/Gait Ambulation/Gait assistance: Supervision Gait Distance (Feet): 300 Feet Assistive device: None Gait Pattern/deviations: Step-through pattern;Decreased stride length Gait velocity: decreased Gait velocity interpretation: 1.31 - 2.62 ft/sec, indicative of limited community ambulator General Gait Details: Pt with slower gait speed for age, no overt LOB, able to perform head turns, varying gait speeds, and stepping over obstacles without gross imbalance  Stairs            Wheelchair Mobility    Modified Rankin (Stroke Patients Only)       Balance Overall balance assessment: Mild deficits observed, not formally tested                                           Pertinent Vitals/Pain Pain Assessment: 0-10 Pain Score: 5   Pain Location: low back Pain Descriptors / Indicators: Aching Pain Intervention(s): Monitored during session    Home Living Family/patient expects to be discharged to:: Private residence Living Arrangements: Alone Available Help at Discharge: Friend(s);Available PRN/intermittently Type of Home: House Home Access: Stairs to enter Entrance Stairs-Rails: None Entrance Stairs-Number of Steps: 1 Home Layout: Two level Home Equipment: Walker - 2 wheels;Cane - single point;Bedside commode;Shower seat      Prior Function Level of Independence: Independent         Comments: Community ambulatory; enjoys walking her [employer]        Extremity/Trunk Assessment   Upper Extremity Assessment Upper Extremity Assessment: Overall WFL for tasks assessed    Lower Extremity Assessment Lower Extremity Assessment: RLE deficits/detail;LLE deficits/detail RLE Deficits / Details: Strength 5/5 LLE Deficits / Details: Strength 5/5    Cervical / Trunk Assessment Cervical / Trunk Assessment: Kyphotic  Communication   Communication: HOH  Cognition Arousal/Alertness: Awake/alert Behavior During Therapy: WFL for tasks assessed/performed Overall Cognitive Status: Within Functional Limits for tasks assessed                                 General Comments: Pt answering questions appropriately, able to wayfind back to her room. Higher cognition not formally assessed      General Comments  Exercises     Assessment/Plan    PT Assessment Patient needs continued PT services  PT Problem List Decreased balance;Decreased mobility       PT Treatment Interventions Gait training;Stair training;Functional mobility training;Therapeutic activities;Therapeutic exercise;Balance training;Patient/family education    PT Goals (Current goals can be found in the Care Plan section)  Acute Rehab PT Goals Patient Stated Goal: "walk my dog." PT Goal Formulation: With  patient Time For Goal Achievement: 02/22/19 Potential to Achieve Goals: Good    Frequency Min 3X/week   Barriers to discharge        Co-evaluation               AM-PAC PT "6 Clicks" Mobility  Outcome Measure Help needed turning from your back to your side while in a flat bed without using bedrails?: None Help needed moving from lying on your back to sitting on the side of a flat bed without using bedrails?: None Help needed moving to and from a bed to a chair (including a wheelchair)?: None Help needed standing up from a chair using your arms (e.g., wheelchair or bedside chair)?: None Help needed to walk in hospital room?: None Help needed climbing 3-5 steps with a railing? : A Little 6 Click Score: 23    End of Session Equipment Utilized During Treatment: Gait belt Activity Tolerance: Patient tolerated treatment well Patient left: in chair;with call bell/phone within reach;with chair alarm set Nurse Communication: Mobility status PT Visit Diagnosis: Unsteadiness on feet (R26.81);History of falling (Z91.81)    Time: [id number] PT Time Calculation (min) (ACUTE ONLY): 19 min   Charges:   PT Evaluation $PT Eval Moderate Complexity: 1 Mod          Ellamae Sia, [place], DPT Acute Rehabilitation Services Pager [phone] Office [phone]   Willy Eddy 02/08/2019, 9:28 AM

## 2019-02-08 NOTE — TOC Progression Note (Signed)
Transition of Care [facility]) - Progression Note    Patient Details  Name: Terri Ayala MRN: [id number] Date of Birth: October 21, 1955  Transition of Care [facility]) CM/SW [place], [place] Phone Number: 02/08/2019, 1:48 PM  Clinical Narrative:     CSW consulted for ETOH resources. CSW met with the patient at bedside. CSW introduced self and explained role. Patient states she lives home alone. Patient states she drinks 2-3 bottles of wine per day. Patient states she has stopped before and " I know what I need to do" to stop. Patient explained with the recent political election and covid it been "a lot to deal with". CSW validated patient's concerns and encouraged patient to explore other ways to cope with stressors. CSW provided patient with SA and MH inpatient and outpatient resources. Patient was thankful for information. Patient states no questions at this time.  Thurmond Butts, MSW, [facility] Clinical Social Worker [phone]   Expected Discharge Plan: Home/Self Care Barriers to Discharge: Barriers Resolved  Expected Discharge Plan and Services Expected Discharge Plan: Home/Self Care         Expected Discharge Date: 02/08/19                                     Social Determinants of Health (SDOH) Interventions    Readmission Risk Interventions No flowsheet data found.

## 2019-02-08 NOTE — Progress Notes (Signed)
Discharged to home after IV access removed and discharge instructions  Given.  Social Worker arranged for [place] to take home.  All questions answered.  No complaints

## 2019-02-11 ENCOUNTER — Inpatient Hospital Stay: Payer: Federal, State, Local not specified - PPO | Admitting: Adult Health

## 2019-02-11 ENCOUNTER — Other Ambulatory Visit: Payer: Self-pay | Admitting: Adult Health

## 2019-02-13 NOTE — Progress Notes (Deleted)
Subjective:    Patient ID: Terri Ayala, female    DOB: 05/29/1955, 63 y.o.   MRN: [id number]  HPI:  Terri Ayala is here for hospital f/u Brief/Interim Summary: 02/05/2019-02/08/2019  63 year old female who presented from home with alcohol withdrawal symptoms. She does have significant past medical history of SVT, alcoholism, who apparently stopped drinking alcohol 24 hours prior to hospitalization. She has experienced palpitations, chest pressure and dyspnea. On admission Sodium 140, potassium 3.6, chloride 99, bicarb 19, glucose 211, BUN 17, creatinine 0.81,magnesium 1.7, white count 13.1, hemoglobin 13.5, hematocrit 40.0, platelets 145.SARS COVID-19 negative.Urinalysis 0-5 white,specific gravity 1.021, 100 protein. Alcohol level less than 10, positive benzodiazepines.Chest radiograph positive hyperinflation, no infiltrates. EKG 119 bpm, normal axis, normal intervals,sinus rhythm, no ST segment T wave changes.  Patient has been admitted to the hospital with working diagnosis of alcohol withdrawal syndrome  Slowly has been improving with decreased requirements of lorazepam, continue to be very weak and deconditioned.   1. Acute alcohol withdrawal syndrome: Patient was admitted with alcohol withdrawal, she presented with tremors, palpitation, chest pain.  She was treated with Ativan IV.  She received 25 mg of chlordiazepoxide yesterday.  Last dose of Ativan was last night.Onthiamine and vitamins. -Patient feel better and wishes to go home.  She has not required Ativan since last night.  Plan to discharge home if she remains stable this afternoon. -Social worker consulted to help provide resources for alcohol cessation -Will discharge on librium for 2 days.   2. History of SVT: Continue propranolol and trazodone.   3. Depression.Onbuspirone and duloxetine, with no complications 4. Chest pain: Resolved suspect related to alcohol withdrawal anxiety.  Troponin  negative. 5-Lactic acidosis; related to alcohol.  Normalized.  Received IV fluids. 6-Hypokalemia; replete orally  NEED BMP/CBC Social worker is assisting with resources for alcohol cessation  Patient Care Team    Relationship Specialty Notifications Start End  [place], Valetta Fuller D, NP PCP - General Family Medicine  06/26/18   Burnell Blanks, MD PCP - Cardiology Cardiology  05/08/18   Bobbye Charleston, MD Consulting Physician Obstetrics and Gynecology  07/10/18   Juanita Craver, MD Consulting Physician Gastroenterology  07/10/18     Patient Active Problem List   Diagnosis Date Noted  . Abnormal liver function 02/06/2019  . Alcohol withdrawal delirium ([place]) 02/06/2019  . Elevated LFTs 07/12/2018  . Healthcare maintenance 06/26/2018  . Rib fracture 01/04/2018  . Chest pain 12/08/2017  . Leukocytosis 12/08/2017  . Essential hypertension 12/08/2017  . Alcohol withdrawal ([place]) 07/21/2017  . Major depressive disorder, recurrent episode, moderate ([place])   . Alcohol use disorder, severe, dependence ([place]) 04/03/2014  . Lumbosacral radiculopathy at L5 05/20/2013  . Nonspecific elevation of levels of transaminase or lactic acid dehydrogenase (LDH) 03/16/2013  . Hepatic steatosis 03/16/2013  . Chronic cholecystitis 03/14/2013  . Cervical arthritis 10/12/2012  . Palpitations 11/29/2010  . SVT (supraventricular tachycardia) ([place]) 11/29/2010  . Mitral valve prolapse 11/29/2010  . Decreased libido 11/15/2010  . GOITER, MULTINODULAR 08/17/2009  . ANXIETY 08/17/2009  . Migraine headache 08/17/2009  . Hearing loss 08/17/2009  . Osteoporosis 08/17/2009  . ASYMPTOMATIC POSTMENOPAUSAL STATUS 08/17/2009  . DYSPHAGIA 06/19/2007     Past Medical History:  Diagnosis Date  . Alcohol withdrawal ([place]) 11/2017; 01/04/2018  . Alcoholism ([place])   . Anxiety   . Arthritis    "hands" (01/04/2018)  . ASYMPTOMATIC POSTMENOPAUSAL STATUS 08/17/2009  . Chronic lower back pain   . Depression   . DYSPHAGIA  06/19/2007  .  Fatty liver, alcoholic   . GERD (gastroesophageal reflux disease)   . GOITER, MULTINODULAR 08/17/2009  . Hyperlipidemia   . Migraines    "not sure what triggers them; I'll have 1 q couple weeks or more; come 2 days in a row when they come" (01/04/2018)  . Mitral valve prolapse   . OSTEOPOROSIS 08/17/2009  . PONV (postoperative nausea and vomiting)   . Supraventricular tachycardia [facility])      Past Surgical History:  Procedure Laterality Date  . BREAST BIOPSY Right    "benign"  . CATARACT EXTRACTION W/ INTRAOCULAR LENS  IMPLANT, BILATERAL    . CERVICAL CONE BIOPSY  2000s  . CERVIX LESION DESTRUCTION  1991   "dysplasia; lesions"  . CHOLECYSTECTOMY N/A 03/14/2013   Procedure: LAPAROSCOPIC CHOLECYSTECTOMY WITH ATTEMPTED INTRAOPERATIVE CHOLANGIOGRAM;  Surgeon: Harl Bowie, MD;  Location: [place];  Service: General;  Laterality: N/A;  . laporoscopic abdominal surgery     "for endometriosis"  . LEFT HEART CATH AND CORONARY ANGIOGRAPHY N/A 05/07/2018   Procedure: LEFT HEART CATH AND CORONARY ANGIOGRAPHY;  Surgeon: Belva Crome, MD;  Location: [place] [facility];  Service: Cardiovascular;  Laterality: N/A;     Family History  Problem Relation Age of Onset  . Colon cancer Father        Deceased, 55  . Osteoporosis Mother        Living, 65  . Healthy Brother   . Healthy Brother   . Healthy Son   . Healthy Son   . Thyroid disease Neg Hx   . Goiter Neg Hx      Social History   Substance and Sexual Activity  Drug Use Never     Social History   Substance and Sexual Activity  Alcohol Use Yes  . Alcohol/week: 28.0 standard drinks  . Types: 28 Glasses of wine per week   Comment: 01/04/2018 "1 1/2 bottles of wine/day"     Social History   Tobacco Use  Smoking Status Former Smoker  . Packs/day: 1.00  . Years: 7.00  . Pack years: 7.00  . Types: Cigarettes  . Quit date: 02/28/1974  . Years since quitting: 44.9  Smokeless Tobacco Never Used  Tobacco Comment    01/04/2018 "smoked when I was a teenager"     Outpatient Encounter Medications as of 02/19/2019  Medication Sig  . alendronate (FOSAMAX) 70 MG tablet Take 70 mg by mouth every Monday. Take with a full glass of water on an empty stomach.   [name] Kitchen aspirin EC 81 MG tablet Take 81 mg by mouth daily.  . busPIRone (BUSPAR) 15 MG tablet Take 15 mg by mouth 3 (three) times daily.  . DULoxetine (CYMBALTA) 60 MG capsule TAKE 1 CAPSULE (60 MG TOTAL) BY MOUTH DAILY. (Patient taking differently: Take 60 mg by mouth 2 (two) times daily. )  . folic acid (FOLVITE) 1 MG tablet Take 1 tablet (1 mg total) by mouth daily.  . hydrOXYzine (ATARAX/VISTARIL) 25 MG tablet Take 25 mg by mouth 2 (two) times daily.   . Multiple Vitamin (MULTIVITAMIN WITH MINERALS) TABS tablet Take 1 tablet by mouth daily.  . norethindrone-ethinyl estradiol (FEMHRT 1/5) 1-5 MG-MCG TABS Take 1 tablet by mouth daily. For Osteoporosis  . ondansetron (ZOFRAN ODT) 4 MG disintegrating tablet Take 1 tablet (4 mg total) by mouth every 8 (eight) hours as needed for nausea or vomiting. (Patient not taking: Reported on 02/08/2019)  . pantoprazole (PROTONIX) 40 MG tablet TAKE 1 TABLET BY MOUTH 2 TIMES  DAILY.  [name] Kitchen propranolol (INDERAL) 10 MG tablet TAKE 1 TABLET BY MOUTH 2 TIMES DAILY. (Patient taking differently: Take 10 mg by mouth 2 (two) times daily. )  . SUMAtriptan (IMITREX) 100 MG tablet Take 1 tablet at the first sign of migraine.  May repeat in 2 hours.  . thiamine 100 MG tablet Take 1 tablet (100 mg total) by mouth daily.  . traZODone (DESYREL) 50 MG tablet Take 50 mg by mouth at bedtime as needed for sleep.    No facility-administered encounter medications on file as of 02/19/2019.    Allergies: Doxycycline, Codeine, and Tetracycline hcl  There is no height or weight on file to calculate BMI.  There were no vitals taken for this visit.     Review of Systems     Objective:   Physical Exam        Assessment & Plan:  No  diagnosis found.  No problem-specific Assessment & Plan notes found for this encounter.    FOLLOW-UP:  No follow-ups on file.

## 2019-02-19 ENCOUNTER — Other Ambulatory Visit: Payer: Self-pay | Admitting: Adult Health

## 2019-02-19 ENCOUNTER — Inpatient Hospital Stay: Payer: Federal, State, Local not specified - PPO | Admitting: Adult Health

## 2019-03-16 ENCOUNTER — Inpatient Hospital Stay (HOSPITAL_COMMUNITY)
Admission: EM | Admit: 2019-03-16 | Discharge: 2019-03-21 | DRG: 897 | Disposition: A | Discharge status: Home / Self Care | Payer: Federal, State, Local not specified - PPO | Attending: Internal Medicine | Admitting: Internal Medicine

## 2019-03-16 ENCOUNTER — Other Ambulatory Visit: Payer: Self-pay

## 2019-03-16 ENCOUNTER — Emergency Department (HOSPITAL_COMMUNITY): Payer: Federal, State, Local not specified - PPO

## 2019-03-16 ENCOUNTER — Inpatient Hospital Stay (HOSPITAL_COMMUNITY): Payer: Federal, State, Local not specified - PPO

## 2019-03-16 DIAGNOSIS — R9431 Abnormal electrocardiogram [ECG] [EKG]: Secondary | ICD-10-CM | POA: Diagnosis present

## 2019-03-16 DIAGNOSIS — K760 Fatty (change of) liver, not elsewhere classified: Secondary | ICD-10-CM | POA: Diagnosis not present

## 2019-03-16 DIAGNOSIS — Z87891 Personal history of nicotine dependence: Secondary | ICD-10-CM

## 2019-03-16 DIAGNOSIS — F10231 Alcohol dependence with withdrawal delirium: Secondary | ICD-10-CM | POA: Diagnosis not present

## 2019-03-16 DIAGNOSIS — F1010 Alcohol abuse, uncomplicated: Secondary | ICD-10-CM

## 2019-03-16 DIAGNOSIS — F329 Major depressive disorder, single episode, unspecified: Secondary | ICD-10-CM | POA: Diagnosis present

## 2019-03-16 DIAGNOSIS — M810 Age-related osteoporosis without current pathological fracture: Secondary | ICD-10-CM | POA: Diagnosis present

## 2019-03-16 DIAGNOSIS — K219 Gastro-esophageal reflux disease without esophagitis: Secondary | ICD-10-CM | POA: Diagnosis present

## 2019-03-16 DIAGNOSIS — Z881 Allergy status to other antibiotic agents status: Secondary | ICD-10-CM

## 2019-03-16 DIAGNOSIS — R079 Chest pain, unspecified: Secondary | ICD-10-CM | POA: Diagnosis present

## 2019-03-16 DIAGNOSIS — Z800 Family history of malignant neoplasm of digestive organs: Secondary | ICD-10-CM | POA: Diagnosis not present

## 2019-03-16 DIAGNOSIS — R072 Precordial pain: Secondary | ICD-10-CM | POA: Diagnosis present

## 2019-03-16 DIAGNOSIS — R0902 Hypoxemia: Secondary | ICD-10-CM | POA: Diagnosis not present

## 2019-03-16 DIAGNOSIS — R7989 Other specified abnormal findings of blood chemistry: Secondary | ICD-10-CM | POA: Diagnosis not present

## 2019-03-16 DIAGNOSIS — E860 Dehydration: Secondary | ICD-10-CM | POA: Diagnosis present

## 2019-03-16 DIAGNOSIS — Z79899 Other long term (current) drug therapy: Secondary | ICD-10-CM

## 2019-03-16 DIAGNOSIS — Z8262 Family history of osteoporosis: Secondary | ICD-10-CM

## 2019-03-16 DIAGNOSIS — Z885 Allergy status to narcotic agent status: Secondary | ICD-10-CM

## 2019-03-16 DIAGNOSIS — R112 Nausea with vomiting, unspecified: Secondary | ICD-10-CM | POA: Diagnosis not present

## 2019-03-16 DIAGNOSIS — D6959 Other secondary thrombocytopenia: Secondary | ICD-10-CM | POA: Diagnosis present

## 2019-03-16 DIAGNOSIS — F419 Anxiety disorder, unspecified: Secondary | ICD-10-CM | POA: Diagnosis present

## 2019-03-16 DIAGNOSIS — Z20822 Contact with and (suspected) exposure to covid-19: Secondary | ICD-10-CM | POA: Diagnosis not present

## 2019-03-16 DIAGNOSIS — E876 Hypokalemia: Secondary | ICD-10-CM | POA: Diagnosis present

## 2019-03-16 DIAGNOSIS — I10 Essential (primary) hypertension: Secondary | ICD-10-CM | POA: Diagnosis not present

## 2019-03-16 DIAGNOSIS — Z9181 History of falling: Secondary | ICD-10-CM | POA: Diagnosis not present

## 2019-03-16 DIAGNOSIS — Z7982 Long term (current) use of aspirin: Secondary | ICD-10-CM | POA: Diagnosis not present

## 2019-03-16 DIAGNOSIS — R52 Pain, unspecified: Secondary | ICD-10-CM | POA: Diagnosis not present

## 2019-03-16 DIAGNOSIS — F10230 Alcohol dependence with withdrawal, uncomplicated: Secondary | ICD-10-CM

## 2019-03-16 DIAGNOSIS — E872 Acidosis, unspecified: Secondary | ICD-10-CM

## 2019-03-16 DIAGNOSIS — E785 Hyperlipidemia, unspecified: Secondary | ICD-10-CM | POA: Diagnosis present

## 2019-03-16 DIAGNOSIS — F10939 Alcohol use, unspecified with withdrawal, unspecified: Secondary | ICD-10-CM | POA: Diagnosis present

## 2019-03-16 DIAGNOSIS — R945 Abnormal results of liver function studies: Secondary | ICD-10-CM | POA: Diagnosis not present

## 2019-03-16 DIAGNOSIS — F10239 Alcohol dependence with withdrawal, unspecified: Secondary | ICD-10-CM | POA: Diagnosis not present

## 2019-03-16 DIAGNOSIS — L03113 Cellulitis of right upper limb: Secondary | ICD-10-CM | POA: Diagnosis present

## 2019-03-16 DIAGNOSIS — F19939 Other psychoactive substance use, unspecified with withdrawal, unspecified: Secondary | ICD-10-CM | POA: Diagnosis not present

## 2019-03-16 DIAGNOSIS — Z7983 Long term (current) use of bisphosphonates: Secondary | ICD-10-CM

## 2019-03-16 DIAGNOSIS — F10930 Alcohol use, unspecified with withdrawal, uncomplicated: Secondary | ICD-10-CM

## 2019-03-16 DIAGNOSIS — L03818 Cellulitis of other sites: Secondary | ICD-10-CM | POA: Diagnosis not present

## 2019-03-16 LAB — COMPREHENSIVE METABOLIC PANEL
ALT: 23 U/L (ref 0–44)
AST: 79 U/L — ABNORMAL HIGH (ref 15–41)
Albumin: 4.5 g/dL (ref 3.5–5.0)
Alkaline Phosphatase: 109 U/L (ref 38–126)
Anion gap: 21 — ABNORMAL HIGH (ref 5–15)
BUN: 18 mg/dL (ref 8–23)
CO2: 17 mmol/L — ABNORMAL LOW (ref 22–32)
Calcium: 9.2 mg/dL (ref 8.9–10.3)
Chloride: 97 mmol/L — ABNORMAL LOW (ref 98–111)
Creatinine, Ser: 0.99 mg/dL (ref 0.44–1.00)
GFR calc Af Amer: >60 mL/min (ref >60)
GFR calc non Af Amer: >60 mL/min (ref >60)
Glucose, Bld: 163 mg/dL — ABNORMAL HIGH (ref 70–99)
Potassium: 4.6 mmol/L (ref 3.5–5.1)
Sodium: 135 mmol/L (ref 135–145)
Total Bilirubin: 2.1 mg/dL — ABNORMAL HIGH (ref 0.3–1.2)
Total Protein: 8 g/dL (ref 6.5–8.1)

## 2019-03-16 LAB — CBC WITH DIFFERENTIAL/PLATELET
Abs Immature Granulocytes: 0.04 10*3/uL (ref 0.00–0.07)
Basophils Absolute: 0.1 10*3/uL (ref 0.0–0.1)
Basophils Relative: 1 %
Eosinophils Absolute: 0 10*3/uL (ref 0.0–0.5)
Eosinophils Relative: 0 %
HCT: 41.9 % (ref 36.0–46.0)
Hemoglobin: 14.8 g/dL (ref 12.0–15.0)
Immature Granulocytes: 0 %
Lymphocytes Relative: 5 %
Lymphs Abs: 0.6 10*3/uL — ABNORMAL LOW (ref 0.7–4.0)
MCH: 33.3 pg (ref 26.0–34.0)
MCHC: 35.3 g/dL (ref 30.0–36.0)
MCV: 94.4 fL (ref 80.0–100.0)
Monocytes Absolute: 1 10*3/uL (ref 0.1–1.0)
Monocytes Relative: 8 %
Neutro Abs: 10.9 10*3/uL — ABNORMAL HIGH (ref 1.7–7.7)
Neutrophils Relative %: 86 %
Platelets: 135 10*3/uL — ABNORMAL LOW (ref 150–400)
RBC: 4.44 MIL/uL (ref 3.87–5.11)
RDW: 13.9 % (ref 11.5–15.5)
WBC: 12.6 10*3/uL — ABNORMAL HIGH (ref 4.0–10.5)
nRBC: 0 % (ref 0.0–0.2)

## 2019-03-16 LAB — TROPONIN I (HIGH SENSITIVITY)
Troponin I (High Sensitivity): 3 ng/L (ref <18)
Troponin I (High Sensitivity): 4 ng/L (ref <18)

## 2019-03-16 LAB — POC SARS CORONAVIRUS 2 AG -  ED: SARS Coronavirus 2 Ag: NEGATIVE

## 2019-03-16 MED ORDER — CHLORDIAZEPOXIDE HCL 25 MG PO CAPS: ORAL_CAPSULE | ORAL | 0 refills | Status: DC

## 2019-03-16 MED ORDER — ONDANSETRON HCL 4 MG PO TABS
4.0000 mg | ORAL_TABLET | Freq: Three times a day (TID) | ORAL | Status: DC | PRN
  Administered 2019-03-16: 18:00:00 4 mg via ORAL
  Filled 2019-03-16: qty 1

## 2019-03-16 MED ORDER — ENOXAPARIN SODIUM 40 MG/0.4ML ~~LOC~~ SOLN
40.0000 mg | SUBCUTANEOUS | Status: DC
  Administered 2019-03-16 – 2019-03-20 (×5): 40 mg via SUBCUTANEOUS
  Filled 2019-03-16 (×5): qty 0.4

## 2019-03-16 MED ORDER — PROCHLORPERAZINE EDISYLATE 10 MG/2ML IJ SOLN: 10.0000 mg | Freq: Four times a day (QID) | INTRAMUSCULAR | Status: DC | PRN

## 2019-03-16 MED ORDER — SODIUM CHLORIDE 0.9 % IV SOLN: INTRAVENOUS | Status: AC

## 2019-03-16 MED ORDER — SODIUM CHLORIDE 0.9 % IV BOLUS
1000.0000 mL | Freq: Once | INTRAVENOUS | Status: AC
  Administered 2019-03-16: 13:00:00 1000 mL via INTRAVENOUS

## 2019-03-16 MED ORDER — ACETAMINOPHEN 325 MG PO TABS: 650.0000 mg | ORAL_TABLET | Freq: Four times a day (QID) | ORAL | Status: DC | PRN

## 2019-03-16 MED ORDER — LORAZEPAM 2 MG/ML IJ SOLN
0.0000 mg | Freq: Four times a day (QID) | INTRAMUSCULAR | Status: DC
  Administered 2019-03-16 (×2): 2 mg via INTRAVENOUS
  Filled 2019-03-16 (×2): qty 1
  Filled 2019-03-16: qty 2

## 2019-03-16 MED ORDER — CHLORDIAZEPOXIDE HCL 5 MG PO CAPS
25.0000 mg | ORAL_CAPSULE | Freq: Four times a day (QID) | ORAL | Status: DC
  Administered 2019-03-16 – 2019-03-19 (×11): 25 mg via ORAL
  Filled 2019-03-16: qty 1
  Filled 2019-03-16 (×2): qty 5
  Filled 2019-03-16: qty 1
  Filled 2019-03-16: qty 5
  Filled 2019-03-16: qty 1
  Filled 2019-03-16 (×5): qty 5

## 2019-03-16 MED ORDER — THIAMINE HCL 100 MG PO TABS
100.0000 mg | ORAL_TABLET | Freq: Every day | ORAL | Status: DC
  Administered 2019-03-17 – 2019-03-21 (×5): 100 mg via ORAL
  Filled 2019-03-16 (×5): qty 1

## 2019-03-16 MED ORDER — LORAZEPAM 2 MG/ML IJ SOLN
1.0000 mg | INTRAMUSCULAR | Status: AC | PRN
  Administered 2019-03-16: 1 mg via INTRAVENOUS
  Administered 2019-03-17: 2 mg via INTRAVENOUS
  Administered 2019-03-17: 1 mg via INTRAVENOUS
  Administered 2019-03-17 (×2): 2 mg via INTRAVENOUS
  Administered 2019-03-17: 1 mg via INTRAVENOUS
  Administered 2019-03-17 (×3): 2 mg via INTRAVENOUS
  Administered 2019-03-19: 1 mg via INTRAVENOUS
  Filled 2019-03-16 (×11): qty 1

## 2019-03-16 MED ORDER — LORAZEPAM 1 MG PO TABS
1.0000 mg | ORAL_TABLET | ORAL | Status: AC | PRN
  Administered 2019-03-16: 23:00:00 1 mg via ORAL
  Filled 2019-03-16: qty 1

## 2019-03-16 MED ORDER — THIAMINE HCL 100 MG PO TABS: 100.0000 mg | ORAL_TABLET | Freq: Every day | ORAL | Status: DC

## 2019-03-16 MED ORDER — LORAZEPAM 2 MG/ML IJ SOLN: 0.0000 mg | Freq: Two times a day (BID) | INTRAMUSCULAR | Status: DC

## 2019-03-16 MED ORDER — LORAZEPAM 1 MG PO TABS: 0.0000 mg | ORAL_TABLET | Freq: Two times a day (BID) | ORAL | Status: DC

## 2019-03-16 MED ORDER — PROPRANOLOL HCL 10 MG PO TABS
10.0000 mg | ORAL_TABLET | Freq: Once | ORAL | Status: AC
  Administered 2019-03-16: 18:00:00 10 mg via ORAL
  Filled 2019-03-16: qty 1

## 2019-03-16 MED ORDER — CHLORDIAZEPOXIDE HCL 25 MG PO CAPS: 25.0000 mg | ORAL_CAPSULE | Freq: Three times a day (TID) | ORAL | Status: DC

## 2019-03-16 MED ORDER — ACETAMINOPHEN 650 MG RE SUPP: 650.0000 mg | Freq: Four times a day (QID) | RECTAL | Status: DC | PRN

## 2019-03-16 MED ORDER — FOLIC ACID 1 MG PO TABS
1.0000 mg | ORAL_TABLET | Freq: Every day | ORAL | Status: DC
  Administered 2019-03-16 – 2019-03-21 (×6): 1 mg via ORAL
  Filled 2019-03-16 (×6): qty 1

## 2019-03-16 MED ORDER — THIAMINE HCL 100 MG/ML IJ SOLN
100.0000 mg | Freq: Every day | INTRAMUSCULAR | Status: DC
  Filled 2019-03-16: qty 2

## 2019-03-16 MED ORDER — THIAMINE HCL 100 MG/ML IJ SOLN
100.0000 mg | Freq: Every day | INTRAMUSCULAR | Status: DC
  Administered 2019-03-16: 15:00:00 100 mg via INTRAVENOUS
  Filled 2019-03-16: qty 2

## 2019-03-16 MED ORDER — LORAZEPAM 1 MG PO TABS: 0.0000 mg | ORAL_TABLET | Freq: Four times a day (QID) | ORAL | Status: DC

## 2019-03-16 MED ORDER — ADULT MULTIVITAMIN W/MINERALS CH
1.0000 | ORAL_TABLET | Freq: Every day | ORAL | Status: DC
  Administered 2019-03-16 – 2019-03-21 (×6): 1 via ORAL
  Filled 2019-03-16 (×6): qty 1

## 2019-03-16 NOTE — ED Provider Notes (Addendum)
[facility]ERGENC[facility] DEPARTMENT Provider Note   CSN: [id number] Arrival date & time: 03/16/19  1216     History Chief Complaint  Patient presents with  . ETOH Withdrawal    Terri Ayala is a 64 y.o. female   HPI  Patient is 64 year old female with history of alcoholism which she has suffered with for years states that she stopped drinking over the summer with the help of Librium but began drinking around Thanksgiving and has been drinking approximately 1 or 2 full bottles of wine daily.  States that her last drink was yesterday evening.  Denies any history of seizures with withdrawal.   Patient is a poor historian but endorses chest pain, shortness of breath, body aches and states that these are identical to symptoms that she experiences with her withdrawals.  She states that the chest pain is sharp and occurs intermittently not with breathing or exertion and does not radiate.  She states she feels that her shortness of breath is related to anxiety.  She also states she has a history of a rapid heart rate but has not been diagnosed with any arrhythmias or with heart failure or cardiomyopathy related to her alcohol use.  She is not sure if she has ever had an echo done before on my review of EMR it appears that she had left heart cath done 05/07/2018 with LVEF 65% and clean cath.  Per nursing her CIWA score today is 16.  Patient has any fevers or chills, denies any heart palpitations.  Denies any SI or HI and states that she would like to be discharged home if possible today.  States she would prefer to be placed on a Librium taper as it has worked for her in the past.     Past Medical History:  Diagnosis Date  . Alcohol withdrawal ([place]) 11/2017; 01/04/2018  . Alcoholism ([place])   . Anxiety   . Arthritis    "hands" (01/04/2018)  . ASYMPTOMATIC POSTMENOPAUSAL STATUS 08/17/2009  . Chronic lower back pain   . Depression   . DYSPHAGIA 06/19/2007  . Fatty liver, alcoholic     . GERD (gastroesophageal reflux disease)   . GOITER, MULTINODULAR 08/17/2009  . Hyperlipidemia   . Migraines    "not sure what triggers them; I'll have 1 q couple weeks or more; come 2 days in a row when they come" (01/04/2018)  . Mitral valve prolapse   . OSTEOPOROSIS 08/17/2009  . PONV (postoperative nausea and vomiting)   . Supraventricular tachycardia [facility])     Patient Active Problem List   Diagnosis Date Noted  . Abnormal liver function 02/06/2019  . Alcohol withdrawal delirium ([place]) 02/06/2019  . Elevated LFTs 07/12/2018  . Healthcare maintenance 06/26/2018  . Rib fracture 01/04/2018  . Chest pain 12/08/2017  . Leukocytosis 12/08/2017  . Essential hypertension 12/08/2017  . Alcohol withdrawal ([place]) 07/21/2017  . Major depressive disorder, recurrent episode, moderate ([place])   . Alcohol use disorder, severe, dependence ([place]) 04/03/2014  . Lumbosacral radiculopathy at L5 05/20/2013  . Nonspecific elevation of levels of transaminase or lactic acid dehydrogenase (LDH) 03/16/2013  . Hepatic steatosis 03/16/2013  . Chronic cholecystitis 03/14/2013  . Cervical arthritis 10/12/2012  . Palpitations 11/29/2010  . SVT (supraventricular tachycardia) ([place]) 11/29/2010  . Mitral valve prolapse 11/29/2010  . Decreased libido 11/15/2010  . GOITER, MULTINODULAR 08/17/2009  . ANXIETY 08/17/2009  . Migraine headache 08/17/2009  . Hearing loss 08/17/2009  . Osteoporosis 08/17/2009  . ASYMPTOMATIC POSTMENOPAUSAL  STATUS 08/17/2009  . DYSPHAGIA 06/19/2007    Past Surgical History:  Procedure Laterality Date  . BREAST BIOPSY Right    "benign"  . CATARACT EXTRACTION W/ INTRAOCULAR LENS  IMPLANT, BILATERAL    . CERVICAL CONE BIOPSY  2000s  . CERVIX LESION DESTRUCTION  1991   "dysplasia; lesions"  . CHOLECYSTECTOMY N/A 03/14/2013   Procedure: LAPAROSCOPIC CHOLECYSTECTOMY WITH ATTEMPTED INTRAOPERATIVE CHOLANGIOGRAM;  Surgeon: Harl Bowie, MD;  Location: [facility];  Service: General;   Laterality: N/A;  . laporoscopic abdominal surgery     "for endometriosis"  . LEFT HEART CATH AND CORONARY ANGIOGRAPHY N/A 05/07/2018   Procedure: LEFT HEART CATH AND CORONARY ANGIOGRAPHY;  Surgeon: Belva Crome, MD;  Location: [facility];  Service: Cardiovascular;  Laterality: N/A;     OB History   No obstetric history on file.     Family History  Problem Relation Age of Onset  . Colon cancer Father        Deceased, 72  . Osteoporosis Mother        Living, 15  . Healthy Brother   . Healthy Brother   . Healthy Son   . Healthy Son   . Thyroid disease Neg Hx   . Goiter Neg Hx     Social History   Tobacco Use  . Smoking status: Former Smoker    Packs/day: 1.00    Years: 7.00    Pack years: 7.00    Types: Cigarettes    Quit date: 02/28/1974    Years since quitting: 45.0  . Smokeless tobacco: Never Used  . Tobacco comment: 01/04/2018 "smoked when I was a teenager"  Substance Use Topics  . Alcohol use: Yes    Alcohol/week: 28.0 standard drinks    Types: 28 Glasses of wine per week    Comment: 01/04/2018 "1 1/2 bottles of wine/day"  . Drug use: Never    Home Medications Prior to Admission medications   Medication Sig Start Date End Date Taking? Authorizing Provider  alendronate (FOSAMAX) 70 MG tablet Take 70 mg by mouth every Monday. Take with a full glass of water on an empty stomach.     [provider]  aspirin EC 81 MG tablet Take 81 mg by mouth daily.    [provider]  busPIRone (BUSPAR) 15 MG tablet Take 15 mg by mouth 3 (three) times daily.    [provider]  chlordiazePOXIDE (LIBRIUM) 25 MG capsule 50mg  PO TID x 1D, then 25-50mg  PO BID X 1D, then 25-50mg  PO QD X 1D 03/16/19   Meegan Shanafelt S, PA  DULoxetine (CYMBALTA) 60 MG capsule TAKE 1 CAPSULE (60 MG TOTAL) BY MOUTH DAILY. Patient taking differently: Take 60 mg by mouth 2 (two) times daily.  10/23/14   Denita Lung, MD  folic acid (FOLVITE) 1 MG tablet Take 1 tablet (1 mg  total) by mouth daily. 02/09/19   Regalado, Belkys A, MD  hydrOXYzine (ATARAX/VISTARIL) 25 MG tablet Take 25 mg by mouth 2 (two) times daily.     [provider]  Multiple Vitamin (MULTIVITAMIN WITH MINERALS) TABS tablet Take 1 tablet by mouth daily. 01/10/18   Mikhail, Velta Addison, DO  norethindrone-ethinyl estradiol (FEMHRT 1/5) 1-5 MG-MCG TABS Take 1 tablet by mouth daily. For Osteoporosis 04/07/14   Lindell Spar I, NP  ondansetron (ZOFRAN ODT) 4 MG disintegrating tablet Take 1 tablet (4 mg total) by mouth every 8 (eight) hours as needed for nausea or vomiting. Patient not taking: Reported  on 02/08/2019 09/01/17   Harland Dingwall L, NP-C  pantoprazole (PROTONIX) 40 MG tablet TAKE 1 TABLET BY MOUTH 2 TIMES DAILY. 02/11/19   Danford, Valetta Fuller D, NP  propranolol (INDERAL) 10 MG tablet TAKE 1 TABLET BY MOUTH 2 TIMES DAILY. Patient taking differently: Take 10 mg by mouth 2 (two) times daily.  12/11/18   Danford, Valetta Fuller D, NP  SUMAtriptan (IMITREX) 100 MG tablet Take 1 tablet at the first sign of migraine.  May repeat in 2 hours. 06/26/18   Danford, Valetta Fuller D, NP  thiamine 100 MG tablet Take 1 tablet (100 mg total) by mouth daily. 02/09/19   Regalado, Belkys A, MD  traZODone (DESYREL) 50 MG tablet Take 50 mg by mouth at bedtime as needed for sleep.     [provider]    Allergies    Doxycycline, Codeine, and Tetracycline hcl  Review of Systems   Review of Systems  Constitutional: Positive for diaphoresis and fatigue. Negative for chills and fever.  HENT: Negative for congestion.   Eyes: Negative for pain.  Respiratory: Positive for chest tightness and shortness of breath. Negative for cough.   Cardiovascular: Positive for chest pain. Negative for leg swelling.  Gastrointestinal: Positive for nausea. Negative for abdominal pain and vomiting.  Genitourinary: Negative for dysuria.  Musculoskeletal: Positive for myalgias.  Skin: Negative for rash.  Neurological: Positive for tremors. Negative  for dizziness and headaches.  Psychiatric/Behavioral: The patient is nervous/anxious.     Physical Exam Updated Vital Signs BP (!) 138/97 (BP Location: Left Arm)   Pulse (!) 115   Temp 97.7 F (36.5 C) (Oral)   Resp (!) 22   Ht 5\' 2"  (1.575 m)   Wt 56.7 kg   SpO2 97%   BMI 22.86 kg/m   Physical Exam Vitals and nursing note reviewed.  Constitutional:      General: She is not in acute distress.    Comments: Anxious appearing tremulous 64 year old female appears older than stated age  HENT:     Head: Normocephalic and atraumatic.     Nose: Nose normal.     Mouth/Throat:     Mouth: Mucous membranes are moist.  Eyes:     General: No scleral icterus. Cardiovascular:     Rate and Rhythm: Regular rhythm. Tachycardia present.     Pulses: Normal pulses.     Heart sounds: Normal heart sounds.     Comments: Pulses symmetric bilateral upper and lower extremities.  Heart rate 120 during my examination.  Heart sounds normal with no murmur audible. Pulmonary:     Effort: Pulmonary effort is normal. No respiratory distress.     Breath sounds: No wheezing.  Abdominal:     Palpations: Abdomen is soft.     Tenderness: There is no abdominal tenderness. There is no right CVA tenderness, left CVA tenderness, guarding or rebound.     Hernia: No hernia is present.  Musculoskeletal:     Cervical back: Normal range of motion.     Right lower leg: No edema.     Left lower leg: No edema.  Skin:    General: Skin is warm and dry.     Capillary Refill: Capillary refill takes less than 2 seconds.  Neurological:     Mental Status: She is alert. Mental status is at baseline.     Comments: Alert and oriented x3.  Moves all 4 limbs.  No focal neuro deficit.  No sensory deficits.  No cranial nerve deficits symmetric smile and all  other cranial nerves within normal notes.  Psychiatric:        Mood and Affect: Mood normal.        Behavior: Behavior normal.     ED Results / Procedures / Treatments     Labs (all labs ordered are listed, but only abnormal results are displayed) Labs Reviewed  COMPREHENSIVE METABOLIC PANEL - Abnormal; Notable for the following components:      Result Value   Chloride 97 (*)    CO2 17 (*)    Glucose, Bld 163 (*)    AST 79 (*)    Total Bilirubin 2.1 (*)    Anion gap 21 (*)    All other components within normal limits  CBC WITH DIFFERENTIAL/PLATELET - Abnormal; Notable for the following components:   WBC 12.6 (*)    Platelets 135 (*)    Neutro Abs 10.9 (*)    Lymphs Abs 0.6 (*)    All other components within normal limits  ETHANOL  RAPID URINE DRUG SCREEN, HOSP PERFORMED  URINALYSIS, ROUTINE W REFLEX MICROSCOPIC  POC SARS CORONAVIRUS 2 AG -  ED  POC URINE PREG, ED  TROPONIN I (HIGH SENSITIVITY)  TROPONIN I (HIGH SENSITIVITY)    EKG EKG Interpretation  Date/Time:  Saturday March 16 2019 13:05:56 EST Ventricular Rate:  106 PR Interval:    QRS Duration: 83 QT Interval:  384 QTC Calculation: 510 R Axis:   59 Text Interpretation: Sinus tachycardia Prolonged QT interval When comaprd to prior, some tachycardia and slightly longer QTc. No sTEMI Confirmed by Antony Blackbird [phone]) on 03/16/2019 3:16:24 PM   Radiology DG Chest Portable 1 View  Result Date: 03/16/2019 CLINICAL DATA:  Chest pain EXAM: PORTABLE CHEST 1 VIEW COMPARISON:  02/05/2019 FINDINGS: The heart size and mediastinal contours are within normal limits. Hyperinflated lungs. No focal airspace consolidation, pleural effusion, or pneumothorax. Remote right-sided rib fractures. IMPRESSION: No active disease. Electronically Signed   By: Davina Poke D.O.   On: 03/16/2019 13:30    Procedures Procedures (including critical care time)  Medications Ordered in ED Medications  LORazepam (ATIVAN) injection 0-4 mg (2 mg Intravenous Given 03/16/19 1632)    Or  LORazepam (ATIVAN) tablet 0-4 mg ( Oral See Alternative 03/16/19 1632)  LORazepam (ATIVAN) injection 0-4 mg (has no  administration in time range)    Or  LORazepam (ATIVAN) tablet 0-4 mg (has no administration in time range)  thiamine tablet 100 mg ( Oral See Alternative 03/16/19 1441)    Or  thiamine (B-1) injection 100 mg (100 mg Intravenous Given 03/16/19 1441)  ondansetron (ZOFRAN) tablet 4 mg (has no administration in time range)  propranolol (INDERAL) tablet 10 mg (has no administration in time range)  sodium chloride 0.9 % bolus 1,000 mL (0 mLs Intravenous Stopped 03/16/19 1410)    ED Course  I have reviewed the triage vital signs and the nursing notes.  Pertinent labs & imaging results that were available during my care of the patient were reviewed by me and considered in my medical decision making (see chart for details).  Patient 64 year old female with history of alcoholism and withdrawal without seizures presents today for symptoms consistent with her withdrawal in the past including chest pain shortness of breath anxiety tremors diaphoresis anxiety.  She states her last drink was yesterday.  She has been drinking 2 bottles of wine per day.  Denies any loss of consciousness or seizure activity today.    Clinical Course as of Mar 16 1731  Sat  Mar 16, 2019  1411 Chest x-ray independently reviewed by myself is without any evidence of fracture or infiltrate   [WF]  1433 CMP remarkable for mildly elevated glucose with low Co2.  AST to ALT ratio is 4.  Mild anion gap of 21.  Likely anion gap is due to alcoholic ketoacidosis.  Doubt DKA as patient has BS within normal limits.  Has no history of Tylenol intake or ethylene glycol iron, or aspirin or isoniazid ingestion.  Comprehensive metabolic panel(!) [WF]  99991111 Unremarkable mild leukocytosis platelets chronically mildly low likely due to early stages of liver disease  CBC with Diff(!) [WF]  1437 Sinus tachycardia.  No evidence of ischemia.  Mildly elevated QT interval.  This combined with patient's tachycardia is concerning for progression to  torsades.  Will provide patient with bolus of fluids and reassess if tachycardias not improve may give home propranolol  ED EKG [WF]    Clinical Course User Index [WF] Tedd Sias, PA   MDM Rules/Calculators/A&P                      Patient is 64 year old female with history of alcoholism and withdrawal presented today with withdrawal symptoms that she states are consistent with her prior withdrawal symptoms.  That she has chest pain and shortness of breath considered broad differential including PE, ACS, CHF, asthma, COPD, pneumothorax, pneumonia.  After consideration these differentials deemed much less likely.  Her chest x-ray is within normal notes other than mildly elevated heart rate and mildly lengthened QT interval.  Tropes x2 within normal limits.  Rapid antigen Covid test negative she declines PCR follow-up test.  Electrolytes within normal limits.  CBC unremarkable.  See above for more detailed interpretation.  Review of EMR Last admitted for w/d in March had CTA negative for PE and echo WNL, trops flat. LHC was clean and DC on PPI.   Patient offered admission but declines.  States that she has not taken her propranolol since yesterday morning.  Suspect that this is contributing to her tachycardia along with dehydration and some aspect of withdrawal.  Patient has received 1 L normal saline, 2 mg of Ativan and thiamine.  States that she feels completely better states that her chest pain has resolved.  States that she would like to go home.  I think this is reasonable at this time as patient is well-appearing does still appear tremulous but is not altered is ANO x3 and able to explain back plan.  Will discharge with Librium as she has had good experience with this in the past.  Given strict return precautions which she states she understands.  7:48 PM -- patient returned to room because she is unable to ambulate and taxi expressed concern and refused transport.   Patient agreeable to  admission. Will consult hospitalist for admission. 7:49 PM.   8:48 PM discussed case with Dr. Marlowe Sax who accepts admission of this patient.   Final Clinical Impression(s) / ED Diagnoses Final diagnoses:  Alcohol withdrawal syndrome without complication [facility])    Rx / DC Orders ED Discharge Orders         Ordered    chlordiazePOXIDE (LIBRIUM) 25 MG capsule     03/16/19 [place], Ellisha Bankson S, [place] 03/16/19 1733    Tedd Sias, PA 03/16/19 1734    Tedd Sias, [place] 03/16/19 2048    [place], Gwenyth Allegra, MD 03/16/19 2059

## 2019-03-16 NOTE — H&P (Addendum)
History and Physical    Terri Ayala XMD:[id number] DOB: 23-Dec-1955 DOA: 03/16/2019  PCP: Esaw Grandchild, NP Patient coming from: Home  Chief Complaint: Tremors  HPI: Terri Ayala is a 64 y.o. female with medical history significant of alcoholism, anxiety, depression, hyperlipidemia, SVT, and conditions listed below presenting to the ED via EMS for evaluation of alcohol withdrawal symptoms.  Patient reports longstanding history of alcohol use but then was clean for 7 months.  A month ago she started drinking heavily again.  She drinks 2 bottles of wine daily.  Yesterday evening was her last dose as she ran out of alcohol.  Since then she is having symptoms including tremors, dizziness, substernal chest pain, shortness of breath, nausea, and vomiting.  She fell last night but strike her head or sustain any injuries from the fall.  She did not lose consciousness.  States she has been admitted to the hospital previously for alcohol withdrawal.  ED Course: Afebrile.  Tachycardic.  WBC count mildly elevated at 12.6.  Platelet count 135,000, previously mildly low as well.  Bicarb 17, anion gap 21.  Blood glucose 163.  AST 79, ALT 23.  Alk phos 109, T bili 2.1.  UDS pending.  UA pending.  High-sensitivity troponin negative x2.  SARS-CoV-2 rapid antigen test negative, PCR test pending.  Chest x-ray showing no active disease. Started on Ativan per CIWA protocol.  Patient was given a 1 L fluid bolus.  Review of Systems:  All systems reviewed and apart from history of presenting illness, are negative.  Past Medical History:  Diagnosis Date  . Alcohol withdrawal ([place]) 11/2017; 01/04/2018  . Alcoholism ([place])   . Anxiety   . Arthritis    "hands" (01/04/2018)  . ASYMPTOMATIC POSTMENOPAUSAL STATUS 08/17/2009  . Chronic lower back pain   . Depression   . DYSPHAGIA 06/19/2007  . Fatty liver, alcoholic   . GERD (gastroesophageal reflux disease)   . GOITER, MULTINODULAR 08/17/2009  . Hyperlipidemia   .  Migraines    "not sure what triggers them; I'll have 1 q couple weeks or more; come 2 days in a row when they come" (01/04/2018)  . Mitral valve prolapse   . OSTEOPOROSIS 08/17/2009  . PONV (postoperative nausea and vomiting)   . Supraventricular tachycardia [facility])     Past Surgical History:  Procedure Laterality Date  . BREAST BIOPSY Right    "benign"  . CATARACT EXTRACTION W/ INTRAOCULAR LENS  IMPLANT, BILATERAL    . CERVICAL CONE BIOPSY  2000s  . CERVIX LESION DESTRUCTION  1991   "dysplasia; lesions"  . CHOLECYSTECTOMY N/A 03/14/2013   Procedure: LAPAROSCOPIC CHOLECYSTECTOMY WITH ATTEMPTED INTRAOPERATIVE CHOLANGIOGRAM;  Surgeon: Harl Bowie, MD;  [facility];  Service: General;  Laterality: N/A;  . laporoscopic abdominal surgery     "for endometriosis"  . LEFT HEART CATH AND CORONARY ANGIOGRAPHY N/A 05/07/2018   Procedure: LEFT HEART CATH AND CORONARY ANGIOGRAPHY;  Surgeon: Belva Crome, MD;  Location: [facility];  Service: Cardiovascular;  Laterality: N/A;     reports that she quit smoking about 45 years ago. Her smoking use included cigarettes. She has a 7.00 pack-year smoking history. She has never used smokeless tobacco. She reports current alcohol use of about 28.0 standard drinks of alcohol per week. She reports that she does not use drugs.  Allergies  Allergen Reactions  . Doxycycline Swelling    Mouth swelling and sores  . Codeine Itching  . Tetracycline Hcl Swelling  Mouth swelling and sores    Family History  Problem Relation Age of Onset  . Colon cancer Father        Deceased, 58  . Osteoporosis Mother        Living, 81  . Healthy Brother   . Healthy Brother   . Healthy Son   . Healthy Son   . Thyroid disease Neg Hx   . Goiter Neg Hx     Prior to Admission medications   Medication Sig Start Date End Date Taking? Authorizing Provider  alendronate (FOSAMAX) 70 MG tablet Take 70 mg by mouth every Monday. Take with a full glass of water on  an empty stomach.     [provider]  aspirin EC 81 MG tablet Take 81 mg by mouth daily.    [provider]  busPIRone (BUSPAR) 15 MG tablet Take 15 mg by mouth 3 (three) times daily.    [provider]  chlordiazePOXIDE (LIBRIUM) 25 MG capsule 13m PO TID x 1D, then 25-562mPO BID X 1D, then 25-5040mO QD X 1D 03/16/19   Fondaw, Wylder S, PA  DULoxetine (CYMBALTA) 60 MG capsule TAKE 1 CAPSULE (60 MG TOTAL) BY MOUTH DAILY. Patient taking differently: Take 60 mg by mouth 2 (two) times daily.  10/23/14   LalDenita LungD  folic acid (FOLVITE) 1 MG tablet Take 1 tablet (1 mg total) by mouth daily. 02/09/19   Regalado, Belkys A, MD  hydrOXYzine (ATARAX/VISTARIL) 25 MG tablet Take 25 mg by mouth 2 (two) times daily.     [provider]  Multiple Vitamin (MULTIVITAMIN WITH MINERALS) TABS tablet Take 1 tablet by mouth daily. 01/10/18   Mikhail, MarVelta AddisonO  norethindrone-ethinyl estradiol (FEMHRT 1/5) 1-5 MG-MCG TABS Take 1 tablet by mouth daily. For Osteoporosis 04/07/14   NwoLindell Spar NP  ondansetron (ZOFRAN ODT) 4 MG disintegrating tablet Take 1 tablet (4 mg total) by mouth every 8 (eight) hours as needed for nausea or vomiting. Patient not taking: Reported on 02/08/2019 09/01/17   HenHarland Dingwall NP-C  pantoprazole (PROTONIX) 40 MG tablet TAKE 1 TABLET BY MOUTH 2 TIMES DAILY. 02/11/19   Danford, KatValetta Fuller NP  propranolol (INDERAL) 10 MG tablet TAKE 1 TABLET BY MOUTH 2 TIMES DAILY. Patient taking differently: Take 10 mg by mouth 2 (two) times daily.  12/11/18   Danford, KatValetta Fuller NP  SUMAtriptan (IMITREX) 100 MG tablet Take 1 tablet at the first sign of migraine.  May repeat in 2 hours. 06/26/18   Danford, KatValetta Fuller NP  thiamine 100 MG tablet Take 1 tablet (100 mg total) by mouth daily. 02/09/19   Regalado, Belkys A, MD  traZODone (DESYREL) 50 MG tablet Take 50 mg by mouth at bedtime as needed for sleep.     [provider]    Physical Exam: Vitals:    03/16/19 1929 03/16/19 1930 03/16/19 2000 03/16/19 2100  BP: (!) 172/105 (!) 141/90 133/89 (!) 145/92  Pulse: (!) 108 (!) 101 (!) 106 (!) 105  Resp: 20 (!) 23 (!) 23 14  Temp:      TempSrc:      SpO2: 99% 97% 99% 100%  Weight:      Height:        Physical Exam  Constitutional: She is oriented to person, place, and time.  Very tremulous  HENT:  Head: Normocephalic.  Eyes: Right eye exhibits no discharge. Left eye exhibits no discharge.  Cardiovascular: Regular rhythm and intact distal  pulses.  Slightly tachycardic  Pulmonary/Chest: Effort normal and breath sounds normal. No respiratory distress. She has no wheezes. She has no rales.  Abdominal: Soft. Bowel sounds are normal. She exhibits no distension. There is no abdominal tenderness. There is no guarding.  Musculoskeletal:        General: No edema.     Cervical back: Neck supple.  Neurological: She is alert and oriented to person, place, and time.  Skin: Skin is warm and dry. She is not diaphoretic.     Labs on Admission: I have personally reviewed following labs and imaging studies  CBC: Recent Labs  Lab 03/16/19 1304  WBC 12.6*  NEUTROABS 10.9*  HGB 14.8  HCT 41.9  MCV 94.4  PLT 354*   Basic Metabolic Panel: Recent Labs  Lab 03/16/19 1304  NA 135  K 4.6  CL 97*  CO2 17*  GLUCOSE 163*  BUN 18  CREATININE 0.99  CALCIUM 9.2   GFR: Estimated Creatinine Clearance: 45.4 mL/min (by C-G formula based on SCr of 0.99 mg/dL). Liver Function Tests: Recent Labs  Lab 03/16/19 1304  AST 79*  ALT 23  ALKPHOS 109  BILITOT 2.1*  PROT 8.0  ALBUMIN 4.5   No results for input(s): LIPASE, AMYLASE in the last 168 hours. No results for input(s): AMMONIA in the last 168 hours. Coagulation Profile: No results for input(s): INR, PROTIME in the last 168 hours. Cardiac Enzymes: No results for input(s): CKTOTAL, CKMB, CKMBINDEX, TROPONINI in the last 168 hours. BNP (last 3 results) No results for input(s): PROBNP in  the last 8760 hours. HbA1C: No results for input(s): HGBA1C in the last 72 hours. CBG: No results for input(s): GLUCAP in the last 168 hours. Lipid Profile: No results for input(s): CHOL, HDL, LDLCALC, TRIG, CHOLHDL, LDLDIRECT in the last 72 hours. Thyroid Function Tests: No results for input(s): TSH, T4TOTAL, FREET4, T3FREE, THYROIDAB in the last 72 hours. Anemia Panel: No results for input(s): VITAMINB12, FOLATE, FERRITIN, TIBC, IRON, RETICCTPCT in the last 72 hours. Urine analysis:    Component Value Date/Time   COLORURINE YELLOW 02/06/2019 0616   APPEARANCEUR CLEAR 02/06/2019 0616   LABSPEC 1.021 02/06/2019 0616   LABSPEC 1.030 09/01/2017 1443   PHURINE 8.0 02/06/2019 0616   GLUCOSEU 150 (A) 02/06/2019 0616   HGBUR NEGATIVE 02/06/2019 0616   BILIRUBINUR NEGATIVE 02/06/2019 0616   BILIRUBINUR moderate (A) 09/01/2017 1443   KETONESUR 20 (A) 02/06/2019 0616   PROTEINUR 100 (A) 02/06/2019 0616   UROBILINOGEN 0.2 07/25/2007 0900   NITRITE NEGATIVE 02/06/2019 0616   LEUKOCYTESUR NEGATIVE 02/06/2019 0616    Radiological Exams on Admission: DG Chest Portable 1 View  Result Date: 03/16/2019 CLINICAL DATA:  Chest pain EXAM: PORTABLE CHEST 1 VIEW COMPARISON:  02/05/2019 FINDINGS: The heart size and mediastinal contours are within normal limits. Hyperinflated lungs. No focal airspace consolidation, pleural effusion, or pneumothorax. Remote right-sided rib fractures. IMPRESSION: No active disease. Electronically Signed   By: Davina Poke D.O.   On: 03/16/2019 13:30    EKG: Independently reviewed.  Sinus tachycardia, heart rate 106.  QTc 510.  QT interval increased since prior tracing.  Assessment/Plan Principal Problem:   Alcohol withdrawal (HCC) Active Problems:   Nausea and vomiting   Chest pain   Elevated LFTs   Metabolic acidosis   Acute alcohol withdrawal Patient drinks heavily - 2 bottles of wine daily.  She ran out of alcohol last night and since then has been  experiencing withdrawal symptoms.  Currently tachycardic and very  tremulous.  She is very high risk for severe withdrawal symptoms. -Admit to progressive care unit -CIWA monitoring -Ativan as needed -Scheduled Librium 25 mg every 6 hours -Thiamine, folate, multivitamin -Social work consult  Chest pain Likely related to acute alcohol withdrawal.  High-sensitivity troponin negative x2.  EKG not suggestive of ACS.  Cardiac cath done March 2020 showing no obstructive coronary disease. -Cardiac monitoring -Continue to monitor  Nausea/emesis/elevated LFTs Likely related to alcohol withdrawal.  Abdominal exam benign. AST: ALT ratio consistent with alcohol use.  Alk phos 109, T bili 2.1. -Right upper quadrant ultrasound to assess for signs of cirrhosis given longstanding history of alcohol use -Compazine as needed for nausea/emesis -Appears dehydrated, give IV fluid hydration  Mild leukocytosis Likely reactive.  Chest x-ray not suggestive of pneumonia. -UA pending to rule out UTI  High anion gap metabolic acidosis Likely related to alcohol use.  Bicarb 17, anion gap 21. -IV fluid hydration -Continue to monitor  Thrombocytopenia Likely related to alcohol use. latelet count 135,000, previously mildly low as well.  No signs of active bleeding. -Continue to monitor  QT prolongation on EKG -Cardiac monitoring -Keep potassium above 4 and magnesium above 2 -Avoid QT prolonging drugs if possible -Repeat EKG in a.m.  Pharmacy med rec pending.  DVT prophylaxis: Lovenox Code Status: Full code Family Communication: No family at bedside. Disposition Plan: Anticipate discharge after clinical improvement. Consults called: None Admission status: It is my clinical opinion that admission to INPATIENT is reasonable and necessary in this 64 y.o. female . presenting with symptoms of acute alcohol withdrawal.  Patient is at very high risk for severe withdrawal symptoms.  Receiving scheduled and as  needed benzodiazepines.  She is being admitted to the progressive care unit for close monitoring.  Given the aforementioned, the predictability of an adverse outcome is felt to be significant. I expect that the patient will require at least 2 midnights in the hospital to treat this condition.   The medical decision making on this patient was of high complexity and the patient is at high risk for clinical deterioration, therefore this is a level 3 visit.  Shela Leff MD [facility] Pager [phone]  If 7PM-7AM, please contact night-coverage [email or web address] Password [facility]  03/16/2019, 9:29 PM

## 2019-03-16 NOTE — ED Triage Notes (Signed)
Pt arrives with EMS due to ETOH withdrawal. per ems, pt has not had a ETOH since yesterday. pt  states that she usually has two bottles of wine a day, but has not had any and started developing severe tremors at home. Pt called ems to get assistance with withdrawal symptoms.  Pt alert and oriented x4 on arrival

## 2019-03-16 NOTE — ED Notes (Signed)
Pt to ultrasound

## 2019-03-16 NOTE — ED Notes (Addendum)
I was taking another patient to the waiting room drop off location outside, when I overheard this pt. In the cab, not able to stand up or, move her body into the seat of the cab, etc.  I assisted at the request of security outside, and spoke with the cab driver that stated, "he was not able to take her home, due to her not being able to ambulate, hold her weight under own power, no one else at home to assist her and her shaking uncontrollably."  I assisted with getting pt. Out of cab after they refused transport. I had to pick her up physically to get back into the wheelchair.  I spoke with the charge nurse about the situation and they stated that she needed to go back to the same room and her discharge was reversed at that time. I spoke with the nurse and explained these circumstances about the pt.  I spoke with Dr. Sherry Ruffing about not feeling great about her going back home.... he would get back with PA to see if admitting her and getting her resources would be beneficial. She is not able to bear weight at all, not able to move her feet, not able to turn body to get into bed.  When she attempted to move, her shaking would worsen dramatically, not allowing her to make the motions needed to get into bed.  I asked is she was able to shower, walk, eat and drink normally, she didn't answer. She explained that she lives on the first floor and is able to sit in a chair, but uses a walker to get around. Im unsure either way if she can take care of herself, but assisted pt back into bed, with fireman lift under her arms, and another tech lifted her legs and we placed her back into bed.

## 2019-03-16 NOTE — Discharge Instructions (Addendum)
Please take librium as prescribed. Please abstain from alcohol. Please follow up with AA. Please read resources attached. Return to ED for any new or concerning symptoms.

## 2019-03-16 NOTE — ED Notes (Signed)
Notified PA patient having difficulty ambulating/tremoring while attempting to discharge. PA to bedside to assess patient at this time.

## 2019-03-16 NOTE — ED Notes (Signed)
Patient verbalizes understanding of discharge instructions. Opportunity for questioning and answers were provided with PA and this nurse. Patient given Cab voucher and assisted to lobby in wheelchair.

## 2019-03-17 ENCOUNTER — Encounter (HOSPITAL_COMMUNITY): Payer: Self-pay | Admitting: Internal Medicine

## 2019-03-17 DIAGNOSIS — R079 Chest pain, unspecified: Secondary | ICD-10-CM

## 2019-03-17 DIAGNOSIS — L03818 Cellulitis of other sites: Secondary | ICD-10-CM

## 2019-03-17 LAB — CBC
HCT: 37.8 % (ref 36.0–46.0)
Hemoglobin: 12.8 g/dL (ref 12.0–15.0)
MCH: 32.7 pg (ref 26.0–34.0)
MCHC: 33.9 g/dL (ref 30.0–36.0)
MCV: 96.7 fL (ref 80.0–100.0)
Platelets: 115 10*3/uL — ABNORMAL LOW (ref 150–400)
RBC: 3.91 MIL/uL (ref 3.87–5.11)
RDW: 13.9 % (ref 11.5–15.5)
WBC: 9.2 10*3/uL (ref 4.0–10.5)
nRBC: 0 % (ref 0.0–0.2)

## 2019-03-17 LAB — COMPREHENSIVE METABOLIC PANEL
ALT: 19 U/L (ref 0–44)
AST: 59 U/L — ABNORMAL HIGH (ref 15–41)
Albumin: 4.1 g/dL (ref 3.5–5.0)
Alkaline Phosphatase: 88 U/L (ref 38–126)
Anion gap: 15 (ref 5–15)
BUN: 20 mg/dL (ref 8–23)
CO2: 23 mmol/L (ref 22–32)
Calcium: 8.5 mg/dL — ABNORMAL LOW (ref 8.9–10.3)
Chloride: 100 mmol/L (ref 98–111)
Creatinine, Ser: 0.79 mg/dL (ref 0.44–1.00)
GFR calc Af Amer: >60 mL/min (ref >60)
GFR calc non Af Amer: >60 mL/min (ref >60)
Glucose, Bld: 105 mg/dL — ABNORMAL HIGH (ref 70–99)
Potassium: 3.6 mmol/L (ref 3.5–5.1)
Sodium: 138 mmol/L (ref 135–145)
Total Bilirubin: 1.4 mg/dL — ABNORMAL HIGH (ref 0.3–1.2)
Total Protein: 7.1 g/dL (ref 6.5–8.1)

## 2019-03-17 LAB — SARS CORONAVIRUS 2 (TAT 6-24 HRS): SARS Coronavirus 2: NEGATIVE

## 2019-03-17 LAB — ETHANOL: Alcohol, Ethyl (B): <10 mg/dL (ref <10)

## 2019-03-17 LAB — MAGNESIUM: Magnesium: 1.9 mg/dL (ref 1.7–2.4)

## 2019-03-17 LAB — MRSA PCR SCREENING: MRSA by PCR: NEGATIVE

## 2019-03-17 LAB — PHOSPHORUS: Phosphorus: 2.4 mg/dL — ABNORMAL LOW (ref 2.5–4.6)

## 2019-03-17 MED ORDER — MAGNESIUM SULFATE IN D5W 1-5 GM/100ML-% IV SOLN
1.0000 g | Freq: Once | INTRAVENOUS | Status: AC
  Administered 2019-03-17: 1 g via INTRAVENOUS
  Filled 2019-03-17: qty 100

## 2019-03-17 MED ORDER — CEFAZOLIN SODIUM-DEXTROSE 2-4 GM/100ML-% IV SOLN
2.0000 g | Freq: Three times a day (TID) | INTRAVENOUS | Status: DC
  Administered 2019-03-17 – 2019-03-19 (×6): 2 g via INTRAVENOUS
  Filled 2019-03-17 (×8): qty 100

## 2019-03-17 MED ORDER — LORAZEPAM 2 MG/ML IJ SOLN
1.0000 mg | Freq: Once | INTRAMUSCULAR | Status: AC
  Administered 2019-03-17: 1 mg via INTRAVENOUS

## 2019-03-17 NOTE — ED Notes (Signed)
Pt's bed changed, given Ativan 1mg . Pt answered questions.

## 2019-03-17 NOTE — Progress Notes (Signed)
Pt unable to urinate regardless multiple attempts. Bladder scan indicated 692 ml of urine in bladder. [place], NP notified.  New order for In and Out catheterization placed.

## 2019-03-17 NOTE — Progress Notes (Signed)
   03/17/19 2037  Vitals  Pulse Rate (!) 110  ECG Heart Rate (!) 112  Resp (!) 25  Oxygen Therapy  SpO2 97 %  MEWS Score  MEWS Temp 0  MEWS Systolic 0  MEWS Pulse 2  MEWS RR 1  MEWS LOC 0  MEWS Score 3  MEWS Score Color Yellow  MEWS Assessment  Is this an acute change? No  No acute change. Will continue to monitor pt closely.

## 2019-03-17 NOTE — Progress Notes (Signed)
PROGRESS NOTE    Terri Ayala  [id number]  DOB: [birth year]  DOA: 03/16/2019 PCP: Esaw Grandchild, NP Outpatient Specialists:   Hospital course:  Terri Ayala is a 64 y.o. female with medical history significant of alcoholism, anxiety, depression, hyperlipidemia, SVT was admitted 03/16/2019 for treatment of of alcohol withdrawal symptoms.  Patient reports longstanding history of alcohol use but then was clean for 7 months.  A month ago she started drinking heavily again.  She drinks 2 bottles of wine daily.  Yesterday evening was her last dose as she ran out of alcohol.    She was an active DTs upon admission.  She was started on Ativan per CIWA protocol and treated with 1 L fluid bolus.  Subjective: Patient is unable to speak with me as she is actively sedated.  She is lying flat in bed and is in no acute respiratory distress.  Flutters her eyes to touch but goes right back to sleep.   Objective: Vitals:   03/17/19 1030 03/17/19 1100 03/17/19 1200 03/17/19 1300  BP: 129/88 119/80 119/80 130/81  Pulse:  (!) 105 (!) 103 (!) 107  Resp: (!) 22 (!) 24    Temp:      TempSrc:      SpO2:      Weight:      Height:        Intake/Output Summary (Last 24 hours) at 03/17/2019 1337 Last data filed at 03/17/2019 1052 Gross per 24 hour  Intake 1100 ml  Output --  Net 1100 ml   Filed Weights   03/16/19 1236  Weight: 56.7 kg     Assessment & Plan: Acute alcohol withdrawal with delirium tremens Patient is on CIWA protocol for treatment of acute DTs. Monitor vital signs closely in stepdown unit Avoid oversedation. Normal saline at 125 an hour for maintenance fluids.  Right Hand Cellulitis And hand with significant erythema and streaking up her right forearm towards her elbow Will start empiric treatment with vancomycin   Chest pain Not likely to be cardiac in nature. At bedtime troponin negative x2. EKG without evidence for ACS. Cardiac cath done March 2020 showing no  obstructive coronary disease.  Nausea/emesis/elevated LFTs Likely related to ongoing alcohol use given AST greater than ALT. RUQ [place] is pending for evaluation of possible cirrhosis.  High anion gap metabolic acidosis Imroved with treatment with IV fluids. Iron gap decreased to 15 from 21.  QT prolongation on EKG QTC was 510 yesterday. K is 3.6, mag is 1.9. Continue telemetry monitoring Repeat EKG in a.m.  DVT prophylaxis: Lovenox Code Status: Full code Family Communication: Spoke with patient's mother Gwyndolyn Saxon in [place]  Disposition Plan: Home   Consultants:  None  Procedures:  Patient is on CIWA protocol   Antimicrobials:  None    Exam:  General: Sedated patient, GCS 9 Eyes: sclera anicteric CVS: S1-S2, tachy Respiratory:  normal effort, symmetrical excursion, CTA without adventitious sounds.  GI: NABS, soft, NT, ND, no palpable masses.  LE: Patient has erythema on right hand with streaking up the arm.   Data Reviewed: Basic Metabolic Panel: Recent Labs  Lab 03/16/19 1304 03/17/19 0436  NA 135 138  K 4.6 3.6  CL 97* 100  CO2 17* 23  GLUCOSE 163* 105*  BUN 18 20  CREATININE 0.99 0.79  CALCIUM 9.2 8.5*  MG  --  1.9  PHOS  --  2.4*   Liver Function Tests: Recent Labs  Lab 03/16/19 1304 03/17/19 0436  AST 79* 59*  ALT 23 19  ALKPHOS 109 88  BILITOT 2.1* 1.4*  PROT 8.0 7.1  ALBUMIN 4.5 4.1   No results for input(s): LIPASE, AMYLASE in the last 168 hours. No results for input(s): AMMONIA in the last 168 hours. CBC: Recent Labs  Lab 03/16/19 1304 03/17/19 0436  WBC 12.6* 9.2  NEUTROABS 10.9*  --   HGB 14.8 12.8  HCT 41.9 37.8  MCV 94.4 96.7  PLT 135* 115*   Cardiac Enzymes: No results for input(s): CKTOTAL, CKMB, CKMBINDEX, TROPONINI in the last 168 hours. BNP (last 3 results) No results for input(s): PROBNP in the last 8760 hours. CBG: No results for input(s): GLUCAP in the last 168 hours.  Recent Results (from the past  240 hour(s))  SARS CORONAVIRUS 2 (TAT 6-24 HRS) Nasopharyngeal Nasopharyngeal Swab     Status: None   Collection Time: 03/16/19  8:04 PM   Specimen: Nasopharyngeal Swab  Result Value Ref Range Status   SARS Coronavirus 2 NEGATIVE NEGATIVE Final    Comment: (NOTE) SARS-CoV-2 target nucleic acids are NOT DETECTED. The SARS-CoV-2 RNA is generally detectable in upper and lower respiratory specimens during the acute phase of infection. Negative results do not preclude SARS-CoV-2 infection, do not rule out co-infections with other pathogens, and should not be used as the sole basis for treatment or other patient management decisions. Negative results must be combined with clinical observations, patient history, and epidemiological information. The expected result is Negative. Fact Sheet for Patients: SugarRoll.be Fact Sheet for Healthcare Providers: https://www.woods-mathews.com/ This test is not yet approved or cleared by the [place] FDA and  has been authorized for detection and/or diagnosis of SARS-CoV-2 by FDA under an Emergency Use Authorization (EUA). This EUA will remain  in effect (meaning this test can be used) for the duration of the COVID-19 declaration under Section 56 4(b)(1) of the Act, 21 U.S.C. section 360bbb-3(b)(1), unless the authorization is terminated or revoked sooner. Performed at [facility], [place] [address] [place], [place] [address]       Studies: DG Chest Portable 1 View  Result Date: 03/16/2019 CLINICAL DATA:  Chest pain EXAM: PORTABLE CHEST 1 VIEW COMPARISON:  02/05/2019 FINDINGS: The heart size and mediastinal contours are within normal limits. Hyperinflated lungs. No focal airspace consolidation, pleural effusion, or pneumothorax. Remote right-sided rib fractures. IMPRESSION: No active disease. Electronically Signed   By: Davina Poke D.O.   On: 03/16/2019 13:30   US Abdomen Limited RUQ  Result  Date: 03/16/2019 CLINICAL DATA:  64 year old female with elevated LFTs. EXAM: ULTRASOUND ABDOMEN LIMITED RIGHT UPPER QUADRANT COMPARISON:  None. FINDINGS: Gallbladder: Cholecystectomy. Common bile duct: Diameter: 4 mm Liver: There is diffuse increased liver echogenicity most commonly seen in the setting of fatty infiltration. Superimposed inflammation or fibrosis is not excluded. Clinical correlation is recommended. Portal vein is patent on color Doppler imaging with normal direction of blood flow towards the liver. Other: None. IMPRESSION: 1. Cholecystectomy. 2. Fatty liver. Electronically Signed   By: Anner Crete M.D.   On: 03/16/2019 22:07     Scheduled Meds: . chlordiazePOXIDE  25 mg Oral Q6H  . enoxaparin (LOVENOX) injection  40 mg Subcutaneous Q24H  . folic acid  1 mg Oral Daily  . multivitamin with minerals  1 tablet Oral Daily  . thiamine  100 mg Oral Daily   Or  . thiamine  100 mg Intravenous Daily   Continuous Infusions:  Principal Problem:   Alcohol withdrawal (HCC) Active Problems:  Nausea and vomiting   Chest pain   Elevated LFTs   Metabolic acidosis     Vashti Hey, MD, FACP, [facility]. [facility]  If 7PM-7AM, please contact night-coverage [email or web address] Password [email or web address] 03/17/2019, 1:37 PM    LOS: 1 day

## 2019-03-17 NOTE — Progress Notes (Signed)
   Vital Signs MEWS/VS Documentation      03/17/2019 1547 03/17/2019 1600 03/17/2019 1604 03/17/2019 1627   MEWS Score:  3  3  4  5    MEWS Score Color:  Yellow  Yellow  Red  Red   Resp:  (!) 26  20  --  --   Pulse:  --  (!) 27  (!) 119  --   BP:  118/78  130/79  118/78  --   Temp:  --  98.8 F (37.1 C)  --  --   O2 Device:  --  Room Air  --  --   Level of Consciousness:  --  Responds to Voice  --  --        Patient going through alcoholic withdrawal. This is not a change from vitals in the ED. Patient is resting with a mild tremor. Ativan last administered in ED at 1326. Will continue to monitor patient and assess CIWA.   Burns Timson 03/17/2019,5:10 PM

## 2019-03-17 NOTE — ED Notes (Signed)
ED TO INPATIENT HANDOFF REPORT  ED Nurse Name and Phone #:   S Name/Age/Gender Terri Ayala 64 y.o. female Room/Bed: 011C/011C  Code Status   Code Status: Full Code  Home/SNF/Other Home Patient oriented to: self Is this baseline? No   Triage Complete: Triage complete  Chief Complaint Alcohol withdrawal ([place]) [F10.239]  Triage Note Pt arrives with EMS due to ETOH withdrawal. per ems, pt has not had a ETOH since yesterday. pt  states that she usually has two bottles of wine a day, but has not had any and started developing severe tremors at home. Pt called ems to get assistance with withdrawal symptoms.  Pt alert and oriented x4 on arrival     Allergies Allergies  Allergen Reactions  . Doxycycline Swelling    Mouth swelling and sores  . Codeine Itching  . Tetracycline Hcl Swelling    Mouth swelling and sores    Level of Care/Admitting Diagnosis ED Disposition    ED Disposition Condition [facility]: [place] [[id number]]  Level of Care: Progressive [102]  Admit to Progressive based on following criteria: MULTISYSTEM THREATS such as stable sepsis, metabolic/electrolyte imbalance with or without encephalopathy that is responding to early treatment.  Covid Evaluation: Asymptomatic Screening Protocol (No Symptoms)  Diagnosis: Alcohol withdrawal (Terri Ayala) [291.81.ICD-9-CM]  Admitting Physician: Shela Leff [[id number]]  Attending Physician: Shela Leff [[id number]]  Estimated length of stay: past midnight tomorrow  Certification:: I certify this patient will need inpatient services for at least 2 midnights       B Medical/Surgery History Past Medical History:  Diagnosis Date  . Alcohol withdrawal ([place]) 11/2017; 01/04/2018  . Alcoholism ([place])   . Anxiety   . Arthritis    "hands" (01/04/2018)  . ASYMPTOMATIC POSTMENOPAUSAL STATUS 08/17/2009  . Chronic lower back pain   . Depression   . DYSPHAGIA 06/19/2007  . Fatty liver,  alcoholic   . GERD (gastroesophageal reflux disease)   . GOITER, MULTINODULAR 08/17/2009  . Hyperlipidemia   . Migraines    "not sure what triggers them; I'll have 1 q couple weeks or more; come 2 days in a row when they come" (01/04/2018)  . Mitral valve prolapse   . OSTEOPOROSIS 08/17/2009  . PONV (postoperative nausea and vomiting)   . Supraventricular tachycardia [facility])    Past Surgical History:  Procedure Laterality Date  . BREAST BIOPSY Right    "benign"  . CATARACT EXTRACTION W/ INTRAOCULAR LENS  IMPLANT, BILATERAL    . CERVICAL CONE BIOPSY  2000s  . CERVIX LESION DESTRUCTION  1991   "dysplasia; lesions"  . CHOLECYSTECTOMY N/A 03/14/2013   Procedure: LAPAROSCOPIC CHOLECYSTECTOMY WITH ATTEMPTED INTRAOPERATIVE CHOLANGIOGRAM;  Surgeon: Harl Bowie, MD;  Location: [place];  Service: General;  Laterality: N/A;  . laporoscopic abdominal surgery     "for endometriosis"  . LEFT HEART CATH AND CORONARY ANGIOGRAPHY N/A 05/07/2018   Procedure: LEFT HEART CATH AND CORONARY ANGIOGRAPHY;  Surgeon: Belva Crome, MD;  Location: [facility];  Service: Cardiovascular;  Laterality: N/A;     A IV Location/Drains/Wounds Patient Lines/Drains/Airways Status   Active Line/Drains/Airways    Name:   Placement date:   Placement time:   Site:   Days:   Peripheral IV 03/17/19 Right;Anterior;Upper Arm   03/17/19    1252    Arm   less than 1          Intake/Output Last 24 hours  Intake/Output Summary (Last 24 hours)  at 03/17/2019 1543 Last data filed at 03/17/2019 1052 Gross per 24 hour  Intake 100 ml  Output -  Net 100 ml    Labs/Imaging Results for orders placed or performed during the hospital encounter of 03/16/19 (from the past 48 hour(s))  Comprehensive metabolic panel     Status: Abnormal   Collection Time: 03/16/19  1:04 PM  Result Value Ref Range   Sodium 135 135 - 145 mmol/L   Potassium 4.6 3.5 - 5.1 mmol/L   Chloride 97 (L) 98 - 111 mmol/L   CO2 17 (L) 22 - 32 mmol/L    Glucose, Bld 163 (H) 70 - 99 mg/dL   BUN 18 8 - 23 mg/dL   Creatinine, Ser 0.99 0.44 - 1.00 mg/dL   Calcium 9.2 8.9 - 10.3 mg/dL   Total Protein 8.0 6.5 - 8.1 g/dL   Albumin 4.5 3.5 - 5.0 g/dL   AST 79 (H) 15 - 41 U/L   ALT 23 0 - 44 U/L   Alkaline Phosphatase 109 38 - 126 U/L   Total Bilirubin 2.1 (H) 0.3 - 1.2 mg/dL   GFR calc non Af Amer >60 >60 mL/min   GFR calc Af Amer >60 >60 mL/min   Anion gap 21 (H) 5 - 15    Comment: REPEATED TO VERIFY Performed at [facility], [address] [place], [place] [address]   CBC with Diff     Status: Abnormal   Collection Time: 03/16/19  1:04 PM  Result Value Ref Range   WBC 12.6 (H) 4.0 - 10.5 K/uL   RBC 4.44 3.87 - 5.11 MIL/uL   Hemoglobin 14.8 12.0 - 15.0 g/dL   HCT 41.9 36.0 - 46.0 %   MCV 94.4 80.0 - 100.0 fL   MCH 33.3 26.0 - 34.0 pg   MCHC 35.3 30.0 - 36.0 g/dL   RDW 13.9 11.5 - 15.5 %   Platelets 135 (L) 150 - 400 K/uL   nRBC 0.0 0.0 - 0.2 %   Neutrophils Relative % 86 %   Neutro Abs 10.9 (H) 1.7 - 7.7 K/uL   Lymphocytes Relative 5 %   Lymphs Abs 0.6 (L) 0.7 - 4.0 K/uL   Monocytes Relative 8 %   Monocytes Absolute 1.0 0.1 - 1.0 K/uL   Eosinophils Relative 0 %   Eosinophils Absolute 0.0 0.0 - 0.5 K/uL   Basophils Relative 1 %   Basophils Absolute 0.1 0.0 - 0.1 K/uL   Immature Granulocytes 0 %   Abs Immature Granulocytes 0.04 0.00 - 0.07 K/uL    Comment: Performed at [facility], [place] [address] [place], [place] [address]  Troponin I (High Sensitivity)     Status: None   Collection Time: 03/16/19  1:04 PM  Result Value Ref Range   Troponin I (High Sensitivity) 3 <18 ng/L    Comment: (NOTE) Elevated high sensitivity troponin I (hsTnI) values and significant  changes across serial measurements may suggest ACS but many other  chronic and acute conditions are known to elevate hsTnI results.  Refer to the "Links" section for chest pain algorithms and additional  guidance. Performed at [facility], [place] [address] [place], [place] [address]   POC SARS Coronavirus 2 Ag-ED - Nasal Swab (BD Veritor Kit)     Status: None   Collection Time: 03/16/19  2:34 PM  Result Value Ref Range   SARS Coronavirus 2 Ag NEGATIVE NEGATIVE    Comment: (NOTE) SARS-CoV-2 antigen  NOT DETECTED.  Negative results are presumptive.  Negative results do not preclude SARS-CoV-2 infection and should not be used as the sole basis for treatment or other patient management decisions, including infection  control decisions, particularly in the presence of clinical signs and  symptoms consistent with COVID-19, or in those who have been in contact with the virus.  Negative results must be combined with clinical observations, patient history, and epidemiological information. The expected result is Negative. Fact Sheet for Patients: PodPark.tn Fact Sheet for Healthcare Providers: GiftContent.is This test is not yet approved or cleared by the [place] FDA and  has been authorized for detection and/or diagnosis of SARS-CoV-2 by FDA under an Emergency Use Authorization (EUA).  This EUA will remain in effect (meaning this test can be used) for the duration of  the COVID-19 de claration under Section 564(b)(1) of the Act, 21 U.S.C. section 360bbb-3(b)(1), unless the authorization is terminated or revoked sooner.   Troponin I (High Sensitivity)     Status: None   Collection Time: 03/16/19  4:35 PM  Result Value Ref Range   Troponin I (High Sensitivity) 4 <18 ng/L    Comment: (NOTE) Elevated high sensitivity troponin I (hsTnI) values and significant  changes across serial measurements may suggest ACS but many other  chronic and acute conditions are known to elevate hsTnI results.  Refer to the "Links" section for chest pain algorithms and additional  guidance. Performed at [facility], [place] [address], [place], [place] [address]   SARS CORONAVIRUS  2 (TAT 6-24 HRS) Nasopharyngeal Nasopharyngeal Swab     Status: None   Collection Time: 03/16/19  8:04 PM   Specimen: Nasopharyngeal Swab  Result Value Ref Range   SARS Coronavirus 2 NEGATIVE NEGATIVE    Comment: (NOTE) SARS-CoV-2 target nucleic acids are NOT DETECTED. The SARS-CoV-2 RNA is generally detectable in upper and lower respiratory specimens during the acute phase of infection. Negative results do not preclude SARS-CoV-2 infection, do not rule out co-infections with other pathogens, and should not be used as the sole basis for treatment or other patient management decisions. Negative results must be combined with clinical observations, patient history, and epidemiological information. The expected result is Negative. Fact Sheet for Patients: SugarRoll.be Fact Sheet for Healthcare Providers: https://www.woods-mathews.com/ This test is not yet approved or cleared by the [place] FDA and  has been authorized for detection and/or diagnosis of SARS-CoV-2 by FDA under an Emergency Use Authorization (EUA). This EUA will remain  in effect (meaning this test can be used) for the duration of the COVID-19 declaration under Section 56 4(b)(1) of the Act, 21 U.S.C. section 360bbb-3(b)(1), unless the authorization is terminated or revoked sooner. Performed at [facility], [place] [address] [place], [place] [address]   Ethanol     Status: None   Collection Time: 03/17/19  4:36 AM  Result Value Ref Range   Alcohol, Ethyl (B) <10 <10 mg/dL    Comment: (NOTE) Lowest detectable limit for serum alcohol is 10 mg/dL. For medical purposes only. Performed at [facility], [place] [address] [place], [place] [address]   Comprehensive metabolic panel     Status: Abnormal   Collection Time: 03/17/19  4:36 AM  Result Value Ref Range   Sodium 138 135 - 145 mmol/L   Potassium 3.6 3.5 - 5.1 mmol/L   Chloride 100 98 - 111 mmol/L   CO2 23 22  - 32 mmol/L   Glucose, Bld 105 (H)  70 - 99 mg/dL   BUN 20 8 - 23 mg/dL   Creatinine, Ser 0.79 0.44 - 1.00 mg/dL   Calcium 8.5 (L) 8.9 - 10.3 mg/dL   Total Protein 7.1 6.5 - 8.1 g/dL   Albumin 4.1 3.5 - 5.0 g/dL   AST 59 (H) 15 - 41 U/L   ALT 19 0 - 44 U/L   Alkaline Phosphatase 88 38 - 126 U/L   Total Bilirubin 1.4 (H) 0.3 - 1.2 mg/dL   GFR calc non Af Amer >60 >60 mL/min   GFR calc Af Amer >60 >60 mL/min   Anion gap 15 5 - 15    Comment: Performed at [facility] [address], [place], [place] [address]  Phosphorus     Status: Abnormal   Collection Time: 03/17/19  4:36 AM  Result Value Ref Range   Phosphorus 2.4 (L) 2.5 - 4.6 mg/dL    Comment: Performed at [facility] [address], [place], [place] [address]  CBC     Status: Abnormal   Collection Time: 03/17/19  4:36 AM  Result Value Ref Range   WBC 9.2 4.0 - 10.5 K/uL   RBC 3.91 3.87 - 5.11 MIL/uL   Hemoglobin 12.8 12.0 - 15.0 g/dL   HCT 37.8 36.0 - 46.0 %   MCV 96.7 80.0 - 100.0 fL   MCH 32.7 26.0 - 34.0 pg   MCHC 33.9 30.0 - 36.0 g/dL   RDW 13.9 11.5 - 15.5 %   Platelets 115 (L) 150 - 400 K/uL    Comment: Immature Platelet Fraction may be clinically indicated, consider ordering this additional test [id number]    nRBC 0.0 0.0 - 0.2 %    Comment: Performed at [facility], [place] [address], [place], [place] [address]  Magnesium     Status: None   Collection Time: 03/17/19  4:36 AM  Result Value Ref Range   Magnesium 1.9 1.7 - 2.4 mg/dL    Comment: Performed at [facility] [address], [place], [place] [address]   DG Chest Portable 1 View  Result Date: 03/16/2019 CLINICAL DATA:  Chest pain EXAM: PORTABLE CHEST 1 VIEW COMPARISON:  02/05/2019 FINDINGS: The heart size and mediastinal contours are within normal limits. Hyperinflated lungs. No focal airspace consolidation, pleural effusion, or pneumothorax. Remote right-sided rib fractures. IMPRESSION: No active disease. Electronically Signed    By: Davina Poke D.O.   On: 03/16/2019 13:30   US Abdomen Limited RUQ  Result Date: 03/16/2019 CLINICAL DATA:  64 year old female with elevated LFTs. EXAM: ULTRASOUND ABDOMEN LIMITED RIGHT UPPER QUADRANT COMPARISON:  None. FINDINGS: Gallbladder: Cholecystectomy. Common bile duct: Diameter: 4 mm Liver: There is diffuse increased liver echogenicity most commonly seen in the setting of fatty infiltration. Superimposed inflammation or fibrosis is not excluded. Clinical correlation is recommended. Portal vein is patent on color Doppler imaging with normal direction of blood flow towards the liver. Other: None. IMPRESSION: 1. Cholecystectomy. 2. Fatty liver. Electronically Signed   By: Anner Crete M.D.   On: 03/16/2019 22:07    Pending Labs Unresulted Labs (From admission, onward)    Start     Ordered   03/18/19 0500  am nondiff cbc  Tomorrow morning,   R     03/17/19 1426   03/18/19 0500  am bmp  Tomorrow morning,   R     03/17/19 1426   03/16/19 1259  Urinalysis, Routine w reflex microscopic  Once,  STAT     03/16/19 1258   03/16/19 1258  Urine rapid drug screen (hosp performed)  ONCE - STAT,   STAT     03/16/19 1257          Vitals/Pain Today's Vitals   03/17/19 1100 03/17/19 1200 03/17/19 1300 03/17/19 1330  BP: 119/80 119/80 130/81 125/74  Pulse: (!) 105 (!) 103 (!) 107 (!) 103  Resp: (!) 24   (!) 26  Temp:      TempSrc:      SpO2:    100%  Weight:      Height:      PainSc:        Isolation Precautions Airborne and Contact precautions  Medications Medications  LORazepam (ATIVAN) tablet 1-4 mg ( Oral See Alternative 03/17/19 1326)    Or  LORazepam (ATIVAN) injection 1-4 mg (2 mg Intravenous Given 03/17/19 1326)  thiamine tablet 100 mg (100 mg Oral Given 03/17/19 1120)    Or  thiamine (B-1) injection 100 mg ( Intravenous See Alternative 5/40/08 6761)  folic acid (FOLVITE) tablet 1 mg (1 mg Oral Given 03/17/19 1323)  multivitamin with minerals tablet 1 tablet (1  tablet Oral Given 03/17/19 1323)  enoxaparin (LOVENOX) injection 40 mg (40 mg Subcutaneous Given 03/16/19 2143)  acetaminophen (TYLENOL) tablet 650 mg (has no administration in time range)    Or  acetaminophen (TYLENOL) suppository 650 mg (has no administration in time range)  prochlorperazine (COMPAZINE) injection 10 mg (has no administration in time range)  0.9 %  sodium chloride infusion ( Intravenous Stopped 03/17/19 1356)  chlordiazePOXIDE (LIBRIUM) capsule 25 mg (25 mg Oral Given 03/17/19 1120)  ceFAZolin (ANCEF) IVPB 2g/100 mL premix (2 g Intravenous Transfusing/Transfer 03/17/19 1543)  sodium chloride 0.9 % bolus 1,000 mL (0 mLs Intravenous Stopped 03/16/19 1410)  propranolol (INDERAL) tablet 10 mg (10 mg Oral Given 03/16/19 1747)  magnesium sulfate IVPB 1 g 100 mL (0 g Intravenous Stopped 03/17/19 1052)  LORazepam (ATIVAN) injection 1 mg (1 mg Intravenous Given 03/17/19 0742)    Mobility non-ambulatory High fall risk   Focused Assessments Pulmonary Assessment Handoff:  Lung sounds:   O2 Device: Room Air        R Recommendations: See Admitting Provider Note  Report given to:   Additional Notes:

## 2019-03-18 LAB — CBC
HCT: 36.8 % (ref 36.0–46.0)
Hemoglobin: 12.5 g/dL (ref 12.0–15.0)
MCH: 32.3 pg (ref 26.0–34.0)
MCHC: 34 g/dL (ref 30.0–36.0)
MCV: 95.1 fL (ref 80.0–100.0)
Platelets: 101 10*3/uL — ABNORMAL LOW (ref 150–400)
RBC: 3.87 MIL/uL (ref 3.87–5.11)
RDW: 13.6 % (ref 11.5–15.5)
WBC: 7.5 10*3/uL (ref 4.0–10.5)
nRBC: 0 % (ref 0.0–0.2)

## 2019-03-18 LAB — RAPID URINE DRUG SCREEN, HOSP PERFORMED
Amphetamines: NOT DETECTED
Barbiturates: NOT DETECTED
Benzodiazepines: POSITIVE — AB
Cocaine: NOT DETECTED
Opiates: NOT DETECTED
Tetrahydrocannabinol: NOT DETECTED

## 2019-03-18 LAB — URINALYSIS, ROUTINE W REFLEX MICROSCOPIC
Bacteria, UA: NONE SEEN
Bilirubin Urine: NEGATIVE
Glucose, UA: NEGATIVE mg/dL
Hgb urine dipstick: NEGATIVE
Ketones, ur: 20 mg/dL — AB
Leukocytes,Ua: NEGATIVE
Nitrite: NEGATIVE
Protein, ur: 100 mg/dL — AB
Specific Gravity, Urine: 1.024 (ref 1.005–1.030)
pH: 6 (ref 5.0–8.0)

## 2019-03-18 LAB — BASIC METABOLIC PANEL
Anion gap: 14 (ref 5–15)
BUN: 18 mg/dL (ref 8–23)
CO2: 24 mmol/L (ref 22–32)
Calcium: 8.4 mg/dL — ABNORMAL LOW (ref 8.9–10.3)
Chloride: 102 mmol/L (ref 98–111)
Creatinine, Ser: 0.74 mg/dL (ref 0.44–1.00)
GFR calc Af Amer: >60 mL/min (ref >60)
GFR calc non Af Amer: >60 mL/min (ref >60)
Glucose, Bld: 103 mg/dL — ABNORMAL HIGH (ref 70–99)
Potassium: 3 mmol/L — ABNORMAL LOW (ref 3.5–5.1)
Sodium: 140 mmol/L (ref 135–145)

## 2019-03-18 MED ORDER — TRAZODONE HCL 50 MG PO TABS
50.0000 mg | ORAL_TABLET | Freq: Every day | ORAL | Status: DC
  Administered 2019-03-18 – 2019-03-20 (×3): 50 mg via ORAL
  Filled 2019-03-18 (×3): qty 1

## 2019-03-18 MED ORDER — POTASSIUM CHLORIDE IN NACL 40-0.9 MEQ/L-% IV SOLN
INTRAVENOUS | Status: DC
  Administered 2019-03-18 – 2019-03-19 (×3): 100 mL/h via INTRAVENOUS
  Filled 2019-03-18 (×4): qty 1000
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
## 2019-03-18 NOTE — Progress Notes (Signed)
Pt unable to urinate, bladder scan showed 672 ml of urine in bladder. MD notified and order for in and out cath placed by [facility], NP for acute urinary retention. In and out performed at the bedside [place] 02 for acute urinary retention. Time of procedure 0015, RN performing catheterization Lorenso Courier, assisted by Titus Dubin, RN. Procedure explained to pt as well as reason. Pt alert and oriented x 4, agreeable and all questions answered.  Peri care completed, bed pad and pt gown changed. No issues during catheter insertion, no resistance, pt seems to be comfortable. Sterile technique maintained during entire procedure. 1050 ml of tea colored urine collected. Pt tolerated well, resting in bed comfortably. Sample sent to lab.  Will continue to monitor pt.

## 2019-03-18 NOTE — Progress Notes (Signed)
Attempt to in and out patient with no success. Will try again in little bit. Will continue to monitor patient.

## 2019-03-19 LAB — BASIC METABOLIC PANEL
Anion gap: 7 (ref 5–15)
BUN: 14 mg/dL (ref 8–23)
CO2: 25 mmol/L (ref 22–32)
Calcium: 8.1 mg/dL — ABNORMAL LOW (ref 8.9–10.3)
Chloride: 110 mmol/L (ref 98–111)
Creatinine, Ser: 0.7 mg/dL (ref 0.44–1.00)
GFR calc Af Amer: >60 mL/min (ref >60)
GFR calc non Af Amer: >60 mL/min (ref >60)
Glucose, Bld: 101 mg/dL — ABNORMAL HIGH (ref 70–99)
Potassium: 3.7 mmol/L (ref 3.5–5.1)
Sodium: 142 mmol/L (ref 135–145)

## 2019-03-19 LAB — CBC WITH DIFFERENTIAL/PLATELET
Abs Immature Granulocytes: 0.03 10*3/uL (ref 0.00–0.07)
Basophils Absolute: 0 10*3/uL (ref 0.0–0.1)
Basophils Relative: 1 %
Eosinophils Absolute: 0.1 10*3/uL (ref 0.0–0.5)
Eosinophils Relative: 1 %
HCT: 32.8 % — ABNORMAL LOW (ref 36.0–46.0)
Hemoglobin: 11 g/dL — ABNORMAL LOW (ref 12.0–15.0)
Immature Granulocytes: 1 %
Lymphocytes Relative: 28 %
Lymphs Abs: 1.5 10*3/uL (ref 0.7–4.0)
MCH: 32.5 pg (ref 26.0–34.0)
MCHC: 33.5 g/dL (ref 30.0–36.0)
MCV: 97 fL (ref 80.0–100.0)
Monocytes Absolute: 0.6 10*3/uL (ref 0.1–1.0)
Monocytes Relative: 12 %
Neutro Abs: 3 10*3/uL (ref 1.7–7.7)
Neutrophils Relative %: 57 %
Platelets: 106 10*3/uL — ABNORMAL LOW (ref 150–400)
RBC: 3.38 MIL/uL — ABNORMAL LOW (ref 3.87–5.11)
RDW: 13.7 % (ref 11.5–15.5)
WBC: 5.2 10*3/uL (ref 4.0–10.5)
nRBC: 0 % (ref 0.0–0.2)

## 2019-03-19 MED ORDER — CHLORDIAZEPOXIDE HCL 5 MG PO CAPS: 25.0000 mg | ORAL_CAPSULE | Freq: Four times a day (QID) | ORAL | Status: DC | PRN

## 2019-03-19 MED ORDER — CHLORHEXIDINE GLUCONATE CLOTH 2 % EX PADS
6.0000 | MEDICATED_PAD | Freq: Every day | CUTANEOUS | Status: DC
  Administered 2019-03-19 – 2019-03-21 (×3): 6 via TOPICAL

## 2019-03-19 MED ORDER — CHLORDIAZEPOXIDE HCL 5 MG PO CAPS
25.0000 mg | ORAL_CAPSULE | Freq: Three times a day (TID) | ORAL | Status: DC | PRN
  Filled 2019-03-19: qty 5

## 2019-03-19 NOTE — Progress Notes (Signed)
Pt on 1600 CIWA assessment had visible hand tremors, skin was clammy, and pt reported increased anxiety to the point she felt slight palpitations. Pt's CIWA scale was 6. RN reported findings along w/ pt's MEW turining yellow to [facility] MD. He agreed for pt to receive 1 mg of ativan. MD recommended PRN Librium if ativan dose did not improve pt's baseline.   RN will continue to monitor pt.
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
## 2019-03-19 NOTE — Plan of Care (Signed)
  Problem: Education: Goal: Knowledge of disease or condition will improve Outcome: Progressing   Problem: Health Behavior/Discharge Planning: Goal: Ability to identify changes in lifestyle to reduce recurrence of condition will improve Outcome: Progressing   Problem: Education: Goal: Knowledge of General Education information will improve Description: Including pain rating scale, medication(s)/side effects and non-pharmacologic comfort measures Outcome: Progressing   Problem: Health Behavior/Discharge Planning: Goal: Ability to manage health-related needs will improve Outcome: Progressing   Problem: Clinical Measurements: Goal: Ability to maintain clinical measurements within normal limits will improve Outcome: Progressing Goal: Will remain free from infection Outcome: Progressing Goal: Diagnostic test results will improve Outcome: Progressing Goal: Respiratory complications will improve Outcome: Progressing Goal: Cardiovascular complication will be avoided Outcome: Progressing   Problem: Activity: Goal: Risk for activity intolerance will decrease Outcome: Progressing   Problem: Nutrition: Goal: Adequate nutrition will be maintained Outcome: Progressing   Problem: Coping: Goal: Level of anxiety will decrease Outcome: Progressing   Problem: Elimination: Goal: Will not experience complications related to bowel motility Outcome: Progressing Goal: Will not experience complications related to urinary retention Outcome: Progressing   Problem: Pain Managment: Goal: General experience of comfort will improve Outcome: Progressing   Problem: Safety: Goal: Ability to remain free from injury will improve Outcome: Progressing   Problem: Skin Integrity: Goal: Risk for impaired skin integrity will decrease Outcome: Progressing

## 2019-03-20 DIAGNOSIS — R112 Nausea with vomiting, unspecified: Secondary | ICD-10-CM

## 2019-03-20 DIAGNOSIS — E872 Acidosis: Secondary | ICD-10-CM

## 2019-03-20 DIAGNOSIS — F10230 Alcohol dependence with withdrawal, uncomplicated: Secondary | ICD-10-CM

## 2019-03-20 LAB — RENAL FUNCTION PANEL
Albumin: 3.1 g/dL — ABNORMAL LOW (ref 3.5–5.0)
Anion gap: 12 (ref 5–15)
BUN: 9 mg/dL (ref 8–23)
CO2: 23 mmol/L (ref 22–32)
Calcium: 8.7 mg/dL — ABNORMAL LOW (ref 8.9–10.3)
Chloride: 102 mmol/L (ref 98–111)
Creatinine, Ser: 0.69 mg/dL (ref 0.44–1.00)
GFR calc Af Amer: >60 mL/min (ref >60)
GFR calc non Af Amer: >60 mL/min (ref >60)
Glucose, Bld: 78 mg/dL (ref 70–99)
Phosphorus: 2.9 mg/dL (ref 2.5–4.6)
Potassium: 3.8 mmol/L (ref 3.5–5.1)
Sodium: 137 mmol/L (ref 135–145)

## 2019-03-20 LAB — MAGNESIUM: Magnesium: 1.9 mg/dL (ref 1.7–2.4)

## 2019-03-20 NOTE — Plan of Care (Signed)
  Problem: Education: Goal: Knowledge of disease or condition will improve Outcome: Progressing   Problem: Health Behavior/Discharge Planning: Goal: Ability to identify changes in lifestyle to reduce recurrence of condition will improve Outcome: Progressing   Problem: Education: Goal: Knowledge of General Education information will improve Description: Including pain rating scale, medication(s)/side effects and non-pharmacologic comfort measures Outcome: Progressing   Problem: Health Behavior/Discharge Planning: Goal: Ability to manage health-related needs will improve Outcome: Progressing   Problem: Clinical Measurements: Goal: Ability to maintain clinical measurements within normal limits will improve Outcome: Progressing Goal: Will remain free from infection Outcome: Progressing Goal: Diagnostic test results will improve Outcome: Progressing Goal: Respiratory complications will improve Outcome: Progressing Goal: Cardiovascular complication will be avoided Outcome: Progressing
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
## 2019-03-21 DIAGNOSIS — R7989 Other specified abnormal findings of blood chemistry: Secondary | ICD-10-CM

## 2019-03-21 LAB — RENAL FUNCTION PANEL
Albumin: 3.5 g/dL (ref 3.5–5.0)
Anion gap: 13 (ref 5–15)
BUN: 11 mg/dL (ref 8–23)
CO2: 24 mmol/L (ref 22–32)
Calcium: 9.1 mg/dL (ref 8.9–10.3)
Chloride: 100 mmol/L (ref 98–111)
Creatinine, Ser: 0.64 mg/dL (ref 0.44–1.00)
GFR calc Af Amer: >60 mL/min (ref >60)
GFR calc non Af Amer: >60 mL/min (ref >60)
Glucose, Bld: 82 mg/dL (ref 70–99)
Phosphorus: 3.9 mg/dL (ref 2.5–4.6)
Potassium: 3.6 mmol/L (ref 3.5–5.1)
Sodium: 137 mmol/L (ref 135–145)

## 2019-03-21 LAB — CBC WITH DIFFERENTIAL/PLATELET
Abs Immature Granulocytes: 0.04 10*3/uL (ref 0.00–0.07)
Basophils Absolute: 0.1 10*3/uL (ref 0.0–0.1)
Basophils Relative: 1 %
Eosinophils Absolute: 0.1 10*3/uL (ref 0.0–0.5)
Eosinophils Relative: 2 %
HCT: 34.7 % — ABNORMAL LOW (ref 36.0–46.0)
Hemoglobin: 11.8 g/dL — ABNORMAL LOW (ref 12.0–15.0)
Immature Granulocytes: 1 %
Lymphocytes Relative: 21 %
Lymphs Abs: 1.4 10*3/uL (ref 0.7–4.0)
MCH: 32.3 pg (ref 26.0–34.0)
MCHC: 34 g/dL (ref 30.0–36.0)
MCV: 95.1 fL (ref 80.0–100.0)
Monocytes Absolute: 0.8 10*3/uL (ref 0.1–1.0)
Monocytes Relative: 12 %
Neutro Abs: 4.1 10*3/uL (ref 1.7–7.7)
Neutrophils Relative %: 63 %
Platelets: 167 10*3/uL (ref 150–400)
RBC: 3.65 MIL/uL — ABNORMAL LOW (ref 3.87–5.11)
RDW: 13.3 % (ref 11.5–15.5)
WBC: 6.5 10*3/uL (ref 4.0–10.5)
nRBC: 0 % (ref 0.0–0.2)

## 2019-03-21 LAB — MAGNESIUM: Magnesium: 1.7 mg/dL (ref 1.7–2.4)

## 2019-03-21 MED ORDER — THIAMINE HCL 100 MG PO TABS: 100.0000 mg | ORAL_TABLET | Freq: Every day | ORAL | 0 refills | Status: AC

## 2019-03-21 NOTE — Care Management (Signed)
Confirmed address with patient and provided taxi voucher for DC.
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
## 2019-03-21 NOTE — Evaluation (Signed)
Occupational Therapy Evaluation Patient Details Name: Terri Ayala MRN: [id number] DOB: 06-Dec-1955 Today's Date: 03/21/2019    History of Present Illness Pt is a 64 y.o. female admitted 03/16/19 with alcohol withdrawal, hypokalemia. PMH hincludes multiple admissions related to withdrawal, SVT, anxiety/depression.   Clinical Impression   Pt admitted with the above diagnoses and presents with below problem list. Pt will benefit from continued acute OT to address the below listed deficits and maximize independence with basic ADLs prior to d/c home. PTA pt was independent with ADLs. Pt is from home, alone. Some unsteadiness noted on full turns during OT eval even with use of rw, cues to adjust speed. Pt currently supervision to min guard with LB ADLs and functional mobility/transfers. Pt reports decrease in strength. Plan to follow acutely.      Follow Up Recommendations  No OT follow up    Equipment Recommendations  None recommended by OT    Recommendations for Other Services       Precautions / Restrictions Precautions Precautions: Fall Restrictions Weight Bearing Restrictions: No      Mobility Bed Mobility Overal bed mobility: Independent             General bed mobility comments: up in chair  Transfers Overall transfer level: Needs assistance Equipment used: Rolling walker (2 wheeled) Transfers: Sit to/from Stand Sit to Stand: Supervision         General transfer comment: to/from recliner. cues to back up fully to recliner prior to siitting.     Balance Overall balance assessment: Needs assistance Sitting-balance support: No upper extremity supported;Feet supported Sitting balance-Leahy Scale: Good     Standing balance support: Bilateral upper extremity supported Standing balance-Leahy Scale: Fair Standing balance comment: able to static stand with no AD, some unsteadiness and need for external support with dynamic tasks                            ADL either performed or assessed with clinical judgement   ADL Overall ADL's : Needs assistance/impaired Eating/Feeding: Set up;Sitting   Grooming: Set up;Min guard;Sitting;Standing   Upper Body Bathing: Set up;Sitting   Lower Body Bathing: Min guard;Sit to/from stand   Upper Body Dressing : Set up;Sitting   Lower Body Dressing: Min guard;Sit to/from stand   Toilet Transfer: Min guard;Ambulation;RW   Toileting- Water quality scientist and Hygiene: Min guard;Sit to/from stand   Tub/ Shower Transfer: Min guard;Ambulation;Rolling walker   Functional mobility during ADLs: Min guard;Rolling walker General ADL Comments: Pt ambulated in the room, cues for adjusting speed on turns as she was a little unsteady even with rw on full turns.     Vision         Perception     Praxis      Pertinent Vitals/Pain Pain Assessment: No/denies pain     Hand Dominance     Extremity/Trunk Assessment Upper Extremity Assessment Upper Extremity Assessment: Generalized weakness   Lower Extremity Assessment Lower Extremity Assessment: Defer to PT evaluation       Communication Communication Communication: HOH   Cognition Arousal/Alertness: Awake/alert Behavior During Therapy: WFL for tasks assessed/performed Overall Cognitive Status: Within Functional Limits for tasks assessed                                 General Comments: WFL for simple tasks; HOH which may be affecting apparent slowed processing   General  Comments  HR up to 162 with ambulation, SpO2 98% RA    Exercises     Shoulder Instructions      Home Living Family/patient expects to be discharged to:: Private residence Living Arrangements: Alone Available Help at Discharge: Friend(s);Available PRN/intermittently Type of Home: House Home Access: Stairs to enter [employer] of Steps: 1 Entrance Stairs-Rails: None Home Layout: Two level Alternate Level Stairs-Number of Steps: 14    Bathroom Shower/Tub: Teacher, early years/pre: Standard     Home Equipment: Environmental consultant - 2 wheels;Cane - single point;Bedside commode;Shower seat          Prior Functioning/Environment Level of Independence: Independent        Comments: Community ambulatory; enjoys walking her dog        OT Problem List: Impaired balance (sitting and/or standing);Decreased knowledge of precautions;Decreased knowledge of use of DME or AE      OT Treatment/Interventions: Self-care/ADL training;Therapeutic exercise;DME and/or AE instruction;Therapeutic activities;Patient/family education;Balance training;Energy conservation    OT Goals(Current goals can be found in the care plan section) Acute Rehab OT Goals Patient Stated Goal: get stronger OT Goal Formulation: With patient Time For Goal Achievement: 04/04/19 Potential to Achieve Goals: Good ADL Goals Pt Will Perform Grooming: with modified independence;standing Pt Will Perform Lower Body Bathing: with modified independence;sit to/from stand Pt Will Perform Lower Body Dressing: with modified independence;sit to/from stand Pt Will Perform Tub/Shower Transfer: with modified independence;ambulating;3 in 1;rolling walker  OT Frequency: Min 2X/week   Barriers to D/C:            Co-evaluation              AM-PAC OT "6 Clicks" Daily Activity     Outcome Measure Help from another person eating meals?: None Help from another person taking care of personal grooming?: None Help from another person toileting, which includes using toliet, bedpan, or urinal?: A Little Help from another person bathing (including washing, rinsing, drying)?: A Little Help from another person to put on and taking off regular upper body clothing?: None Help from another person to put on and taking off regular lower body clothing?: None 6 Click Score: 22   End of Session Equipment Utilized During Treatment: Rolling walker  Activity Tolerance: Patient  tolerated treatment well Patient left: in chair  OT Visit Diagnosis: Unsteadiness on feet (R26.81);Muscle weakness (generalized) (M62.81)                Time: [id number] OT Time Calculation (min): 8 min Charges:  OT General Charges $OT Visit: 1 Visit OT Evaluation $OT Eval Low Complexity: [place], OT Acute Rehabilitation Services Pager: [phone] Office: [phone]   Hortencia Pilar 03/21/2019, 10:22 AM

## 2019-03-21 NOTE — Evaluation (Signed)
Physical Therapy Evaluation & Discharge Patient Details Name: Terri Ayala MRN: [id number] DOB: 1955/04/21 Today's Date: 03/21/2019   History of Present Illness  Pt is a 64 y.o. female admitted 03/16/19 with alcohol withdrawal, hypokalemia. PMH hincludes multiple admissions related to withdrawal, SVT, anxiety/depression.    Clinical Impression  Patient evaluated by Physical Therapy with no further acute PT needs identified. PTA, pt mod indep with intermittent use of RW, lives alone and enjoys walking dog. Today, pt progressing to mod indep transfers and ambulation with RW; ambulating without DME, some self-corrected instability at supervision-level. Encouraged use of RW for a couple days upon return home to decrease fall risk; pt in agreement. All education has been completed and the patient has no further questions. Acute PT is signing off. Thank you for this referral.    Follow Up Recommendations No PT follow up;Supervision - Intermittent    Equipment Recommendations  None recommended by PT    Recommendations for Other Services       Precautions / Restrictions Restrictions Weight Bearing Restrictions: No      Mobility  Bed Mobility Overal bed mobility: Independent                Transfers Overall transfer level: Modified independent Equipment used: Rolling walker (2 wheeled)                Ambulation/Gait Ambulation/Gait assistance: Modified independent (Device/Increase time);Supervision Gait Distance (Feet): 300 Feet Assistive device: Rolling walker (2 wheeled);None Gait Pattern/deviations: Step-through pattern;Decreased stride length;Trunk flexed   Gait velocity interpretation: 1.31 - 2.62 ft/sec, indicative of limited community ambulator General Gait Details: Initial slow, shuffling gait with RW; additional gait training without DME at supervision-level, intermittent self-corrected instability, improving with distance; progressing to mod indep with  RW  Stairs            Wheelchair Mobility    Modified Rankin (Stroke Patients Only)       Balance Overall balance assessment: Mild deficits observed, not formally tested                                           Pertinent Vitals/Pain Pain Assessment: No/denies pain    Home Living Family/patient expects to be discharged to:: Private residence Living Arrangements: Alone Available Help at Discharge: Friend(s);Available PRN/intermittently Type of Home: House Home Access: Stairs to enter Entrance Stairs-Rails: None Entrance Stairs-Number of Steps: 1 Home Layout: Two level Home Equipment: Walker - 2 wheels;Cane - single point;Bedside commode;Shower seat      Prior Function Level of Independence: Independent         Comments: Community ambulatory; enjoys walking her Dealer Dominance        Extremity/Trunk Assessment   Upper Extremity Assessment Upper Extremity Assessment: Generalized weakness(tremulous at times)    Lower Extremity Assessment Lower Extremity Assessment: Generalized weakness       Communication      Cognition Arousal/Alertness: Awake/alert Behavior During Therapy: WFL for tasks assessed/performed Overall Cognitive Status: Within Functional Limits for tasks assessed                                 General Comments: WFL for simple tasks; HOH which may be affecting apparent slowed processing      General Comments General comments (skin integrity, edema, etc.): HR  up to 162 with ambulation, SpO2 98% RA    Exercises     Assessment/Plan    PT Assessment Patent does not need any further PT services  PT Problem List         PT Treatment Interventions      PT Goals (Current goals can be found in the Care Plan section)  Acute Rehab PT Goals PT Goal Formulation: All assessment and education complete, DC therapy    Frequency     Barriers to discharge        Co-evaluation                AM-PAC PT "6 Clicks" Mobility  Outcome Measure Help needed turning from your back to your side while in a flat bed without using bedrails?: None Help needed moving from lying on your back to sitting on the side of a flat bed without using bedrails?: None Help needed moving to and from a bed to a chair (including a wheelchair)?: None Help needed standing up from a chair using your arms (e.g., wheelchair or bedside chair)?: None Help needed to walk in hospital room?: None Help needed climbing 3-5 steps with a railing? : A Little 6 Click Score: 23    End of Session Equipment Utilized During Treatment: Gait belt Activity Tolerance: Patient tolerated treatment well Patient left: in chair;with call bell/phone within reach Nurse Communication: Mobility status PT Visit Diagnosis: Other abnormalities of gait and mobility (R26.89)    Time: [id number] PT Time Calculation (min) (ACUTE ONLY): 15 min   Charges:   PT Evaluation $PT Eval Moderate Complexity: [place], PT, DPT Acute Rehabilitation Services  Pager [phone] Office [place] 03/21/2019, 9:25 AM

## 2019-03-23 ENCOUNTER — Other Ambulatory Visit: Payer: Self-pay | Admitting: Adult Health

## 2019-03-25 ENCOUNTER — Telehealth: Payer: Self-pay

## 2019-03-25 NOTE — Telephone Encounter (Signed)
Please call pt to schedule appt.  No further refills until pt is seen.  T. Stashia Sia, CMA  

## 2019-04-04 ENCOUNTER — Ambulatory Visit (INDEPENDENT_AMBULATORY_CARE_PROVIDER_SITE_OTHER): Payer: Federal, State, Local not specified - PPO | Admitting: Adult Health

## 2019-04-04 ENCOUNTER — Encounter: Payer: Self-pay | Admitting: Adult Health

## 2019-04-04 ENCOUNTER — Other Ambulatory Visit: Payer: Self-pay

## 2019-04-04 VITALS — BP 118/72 | HR 79 | Temp 98.5°F | Ht 62.0 in | Wt 136.6 lb

## 2019-04-04 DIAGNOSIS — R7989 Other specified abnormal findings of blood chemistry: Secondary | ICD-10-CM | POA: Diagnosis not present

## 2019-04-04 DIAGNOSIS — D72829 Elevated white blood cell count, unspecified: Secondary | ICD-10-CM | POA: Diagnosis not present

## 2019-04-04 DIAGNOSIS — F1010 Alcohol abuse, uncomplicated: Secondary | ICD-10-CM

## 2019-04-04 DIAGNOSIS — F1020 Alcohol dependence, uncomplicated: Secondary | ICD-10-CM

## 2019-04-04 DIAGNOSIS — F331 Major depressive disorder, recurrent, moderate: Secondary | ICD-10-CM

## 2019-04-04 DIAGNOSIS — I10 Essential (primary) hypertension: Secondary | ICD-10-CM

## 2019-04-04 DIAGNOSIS — Z09 Encounter for follow-up examination after completed treatment for conditions other than malignant neoplasm: Secondary | ICD-10-CM | POA: Diagnosis not present

## 2019-04-04 DIAGNOSIS — I471 Supraventricular tachycardia: Secondary | ICD-10-CM

## 2019-04-04 DIAGNOSIS — M810 Age-related osteoporosis without current pathological fracture: Secondary | ICD-10-CM

## 2019-04-04 MED ORDER — PROPRANOLOL HCL 10 MG PO TABS: 10.0000 mg | ORAL_TABLET | Freq: Two times a day (BID) | ORAL | 0 refills | Status: DC

## 2019-04-04 NOTE — Progress Notes (Signed)
Subjective:    Patient ID: Terri Ayala, female    DOB: 03-24-1955, 64 y.o.   MRN: [id number]  YWV:PXTG note is not being shared with the patient for the following reason: To respect privacy (The patient or proxy has requested that the information not be shared). Admit Date 03/16/2019 Discharge Date 03/21/2019 Brief/Interim Summary: 44 Hispanic female prior anxiety depression HLD SVT heavy use of alcohol Admitted twice in 2020 for alcohol withdrawal related issues and ending 3 times in 2019-has been seen in 2016 for withdrawal by [place] 03/16/2019 with withdrawal after drinking for the past month 2 bottles of wine daily found to be tachycardic and an anion gap 21 WBC 12.6 started on Ativan protocol  03/21/19: Up and in chair today. Eval'd by PT/OT. No needs. She looks good today. [facility] [place] for discharge.  Discharge Diagnoses:  Principal Problem:   Alcohol withdrawal (HCC) Active Problems:   Nausea and vomiting   Chest pain   Elevated LFTs   Metabolic acidosis  Alcohol withdrawal with delirium tremens - improving - per SWer note, long time alcohol use and was in AA but didn't feel virtual AA was as efective.  Hand cellulitis - abx d/c'd d/t no s/s of infection  Anxiety/depression - home meds include cymbalta 60, buspirone 15 3 times daily,  - currently on trazodone 55m qHS  Leukocytosis on admission - Resolved; afebrile, vitals ok  Hypokalemia - resolved  Prior history of SVT - keep on cardiac monitor  Reflux - protonix 457mBID  Contraception - Continue norethindrone when more awake  Question of abuse - Found to have bilateral sacral and buttock bruising social work input sought - denies to SWer any concerns of abuse  04/04/2019 OV: Terri Ayala here for hospital f/u. She reports remaining all ETOH since d/c. She continues to abstain from tobacco/vape. She reports losing all local friends due to her  alcoholism. He partner, "Terri Ayala" separated from her Oct 2020 due to her alcohol use. [name], after the presidential election- escalated up to 2 bottles of a wine in early Jan when she sought care at ED and was subsequently hospitalized. She is followed by [facility]. She report stable mood, denies SI/HI. Discussed at length the long-term effects of heavy alcohol use and this is the time she needs intensive therapy for permanent remission. Discussed several treatment options She was [place] [facility] in [place], not currently using any measures. She lives alone with her dig "Bella". She feels that her anxiety is well managed in her current medications. Encouraged close follow-up with [facility]. She remarked that she may move from the area "since I don't have any friends or work to keep me here". She is unsure where she would to. She denies acute cardiac or respiratory sx's are present.  CMP/CBC drawn today  Patient Care Team    Relationship Specialty Notifications Start End  DaMina Marble, NP PCP - General Family Medicine  06/26/18   McBurnell BlanksMD PCP - Cardiology Cardiology  05/08/18   HoBobbye CharlestonMD Consulting Physician Obstetrics and Gynecology  07/10/18   MaJuanita CraverMD Consulting Physician Gastroenterology  07/10/18     Patient Active Problem List   Diagnosis Date Noted  . Alcohol abuse 04/05/2019  . Hospital discharge follow-up 04/04/2019  . Metabolic acidosis 0162/69/4854. Abnormal liver function 02/06/2019  . Alcohol withdrawal delirium ([place]/10/2018  . Elevated LFTs 07/12/2018  .  Healthcare maintenance 06/26/2018  . Rib fracture 01/04/2018  . Chest pain 12/08/2017  . Leukocytosis 12/08/2017  . Essential hypertension 12/08/2017  . Alcohol withdrawal ([place]) 07/21/2017  . Major depressive disorder, recurrent episode, moderate ([place])   . Alcohol use disorder, severe, dependence  ([place]) 04/03/2014  . Lumbosacral radiculopathy at L5 05/20/2013  . Nonspecific elevation of levels of transaminase or lactic acid dehydrogenase (LDH) 03/16/2013  . Hepatic steatosis 03/16/2013  . Nausea and vomiting 03/16/2013  . Chronic cholecystitis 03/14/2013  . Cervical arthritis 10/12/2012  . Palpitations 11/29/2010  . SVT (supraventricular tachycardia) ([place]) 11/29/2010  . Mitral valve prolapse 11/29/2010  . Decreased libido 11/15/2010  . GOITER, MULTINODULAR 08/17/2009  . ANXIETY 08/17/2009  . Migraine headache 08/17/2009  . Hearing loss 08/17/2009  . Osteoporosis 08/17/2009  . ASYMPTOMATIC POSTMENOPAUSAL STATUS 08/17/2009  . DYSPHAGIA 06/19/2007     Past Medical History:  Diagnosis Date  . Alcohol withdrawal ([place]) 11/2017; 01/04/2018  . Alcoholism ([place])   . Anxiety   . Arthritis    "hands" (01/04/2018)  . ASYMPTOMATIC POSTMENOPAUSAL STATUS 08/17/2009  . Chronic lower back pain   . Depression   . DYSPHAGIA 06/19/2007  . Fatty liver, alcoholic   . GERD (gastroesophageal reflux disease)   . GOITER, MULTINODULAR 08/17/2009  . Hyperlipidemia   . Migraines    "not sure what triggers them; I'll have 1 q couple weeks or more; come 2 days in a row when they come" (01/04/2018)  . Mitral valve prolapse   . OSTEOPOROSIS 08/17/2009  . PONV (postoperative nausea and vomiting)   . Supraventricular tachycardia [facility])      Past Surgical History:  Procedure Laterality Date  . BREAST BIOPSY Right    "benign"  . CATARACT EXTRACTION W/ INTRAOCULAR LENS  IMPLANT, BILATERAL    . CERVICAL CONE BIOPSY  2000s  . CERVIX LESION DESTRUCTION  1991   "dysplasia; lesions"  . CHOLECYSTECTOMY N/A 03/14/2013   Procedure: LAPAROSCOPIC CHOLECYSTECTOMY WITH ATTEMPTED INTRAOPERATIVE CHOLANGIOGRAM;  Surgeon: Harl Bowie, MD;  Location: [place];  Service: General;  Laterality: N/A;  . laporoscopic abdominal surgery     "for endometriosis"  . LEFT HEART CATH AND CORONARY ANGIOGRAPHY N/A 05/07/2018    Procedure: LEFT HEART CATH AND CORONARY ANGIOGRAPHY;  Surgeon: Belva Crome, MD;  Location: [place] [facility];  Service: Cardiovascular;  Laterality: N/A;     Family History  Problem Relation Age of Onset  . Colon cancer Father        Deceased, 53  . Osteoporosis Mother        Living, 57  . Healthy Brother   . Healthy Brother   . Healthy Son   . Healthy Son   . Thyroid disease Neg Hx   . Goiter Neg Hx      Social History   Substance and Sexual Activity  Drug Use Never     Social History   Substance and Sexual Activity  Alcohol Use Yes  . Alcohol/week: 28.0 standard drinks  . Types: 28 Glasses of wine per week   Comment: 01/04/2018 "1 1/2 bottles of wine/day"     Social History   Tobacco Use  Smoking Status Former Smoker  . Packs/day: 1.00  . Years: 7.00  . Pack years: 7.00  . Types: Cigarettes  . Quit date: 02/28/1974  . Years since quitting: 45.1  Smokeless Tobacco Never Used  Tobacco Comment   01/04/2018 "smoked when I was a teenager"     Outpatient Encounter Medications as  of 04/04/2019  Medication Sig Note  . alendronate (FOSAMAX) 70 MG tablet Take 70 mg by mouth every Monday. Take with a full glass of water on an empty stomach.  03/16/2019: #12/84 days filled 02/11/19 [facility]  . aspirin EC 81 MG tablet Take 81 mg by mouth daily.   . busPIRone (BUSPAR) 15 MG tablet Take 15 mg by mouth 3 (three) times daily. 03/16/2019: #90/30 days filled 02/11/19 [facility]  . chlordiazePOXIDE (LIBRIUM) 25 MG capsule '50mg'$  PO TID x 1D, then 25-'50mg'$  PO BID X 1D, then 25-'50mg'$  PO QD X 1D   . DULoxetine (CYMBALTA) 60 MG capsule TAKE 1 CAPSULE (60 MG TOTAL) BY MOUTH DAILY. (Patient taking differently: Take 60 mg by mouth 2 (two) times daily. ) 03/16/2019: #60 filled 02/11/19 [facility]  . folic acid (FOLVITE) 1 MG tablet Take 1 tablet (1 mg total) by mouth daily.   . Multiple Vitamin (MULTIVITAMIN WITH MINERALS) TABS tablet Take 1 tablet by mouth daily.   .  norethindrone-ethinyl estradiol (FEMHRT 1/5) 1-5 MG-MCG TABS Take 1 tablet by mouth daily. For Osteoporosis 03/16/2019: #84/84 days filled 01/14/19 [facility] (filled as Furniture conservator/restorer")  . pantoprazole (PROTONIX) 40 MG tablet TAKE 1 TABLET BY MOUTH 2 TIMES DAILY. (Patient taking differently: Take 40 mg by mouth 2 (two) times daily. ) 03/16/2019: #60 filled 02/11/19 [facility]  . propranolol (INDERAL) 10 MG tablet Take 1 tablet (10 mg total) by mouth 2 (two) times daily.   . SUMAtriptan (IMITREX) 100 MG tablet Take 1 tablet at the first sign of migraine.  May repeat in 2 hours. (Patient taking differently: Take 100 mg by mouth See admin instructions. Take 1 tablet (100 mg) by mouth at the first sign of migraine, may repeat in 2 hours if still needed) 03/16/2019: #10 filled 02/11/19 [facility]  . thiamine 100 MG tablet Take 1 tablet (100 mg total) by mouth daily.   . traZODone (DESYREL) 50 MG tablet Take 50 mg by mouth at bedtime as needed for sleep.  03/16/2019: #30 filled 02/11/19 [facility]  . [DISCONTINUED] propranolol (INDERAL) 10 MG tablet TAKE 1 TABLET BY MOUTH 2 TIMES DAILY.    No facility-administered encounter medications on file as of 04/04/2019.    Allergies: Doxycycline, Codeine, and Tetracycline hcl  Body mass index is 24.98 kg/m.  Blood pressure 118/72, pulse 79, temperature 98.5 F (36.9 C), temperature source Oral, height '5\' 2"'$  (1.575 m), weight 136 lb 9.6 oz (62 kg), SpO2 100 %.     Review of Systems  Constitutional: Positive for fatigue. Negative for activity change, appetite change, chills, diaphoresis, fever and unexpected weight change.  HENT: Negative for congestion.   Eyes: Negative for visual disturbance.  Respiratory: Negative for cough, chest tightness, shortness of breath, wheezing and stridor.   Cardiovascular: Negative for chest pain, palpitations and leg swelling.  Gastrointestinal: Negative for abdominal distention, abdominal pain, blood in stool,  constipation and diarrhea.  Endocrine: Negative for polydipsia, polyphagia and polyuria.  Genitourinary: Negative for difficulty urinating and flank pain.  Musculoskeletal: Negative for arthralgias, gait problem, joint swelling, myalgias and neck stiffness.  Neurological: Negative for dizziness and headaches.  Hematological: Negative for adenopathy. Does not bruise/bleed easily.  Psychiatric/Behavioral: Negative for behavioral problems, confusion, decreased concentration, dysphoric mood, hallucinations, self-injury, sleep disturbance and suicidal ideas. The patient is nervous/anxious. The patient is not hyperactive.        Objective:   Physical Exam Vitals and nursing note reviewed.  Constitutional:      General:  She is not in acute distress.    Appearance: Normal appearance. She is normal weight. She is not ill-appearing or toxic-appearing.  HENT:     Head: Normocephalic and atraumatic.  Eyes:     Extraocular Movements: Extraocular movements intact.     Conjunctiva/sclera: Conjunctivae normal.     Pupils: Pupils are equal, round, and reactive to light.  Cardiovascular:     Rate and Rhythm: Normal rate and regular rhythm.     Pulses: Normal pulses.     Heart sounds: Normal heart sounds. No murmur. No friction rub. No gallop.   Pulmonary:     Effort: Pulmonary effort is normal. No respiratory distress.     Breath sounds: Normal breath sounds. No stridor. No wheezing, rhonchi or rales.  Chest:     Chest wall: No tenderness.  Skin:    General: Skin is warm and dry.     Capillary Refill: Capillary refill takes less than 2 seconds.  Neurological:     Mental Status: She is alert and oriented to person, place, and time.     Motor: Tremor present.  Psychiatric:        Attention and Perception: Attention and perception normal.        Mood and Affect: Mood is anxious.        Speech: Speech normal.        Behavior: Behavior normal.        Thought Content: Thought content normal.         Cognition and Memory: Cognition and memory normal.        Judgment: Judgment normal.        Assessment & Plan:   1. Leukocytosis, unspecified type   2. Essential hypertension   3. Elevated LFTs   4. Hospital discharge follow-up   5. Osteoporosis, unspecified osteoporosis type, unspecified pathological fracture presence   6. SVT (supraventricular tachycardia) ([place])   7. Major depressive disorder, recurrent episode, moderate (HCC)   8. Alcohol abuse   9. Alcohol use disorder, severe, dependence [facility])     Hospital discharge follow-up Reviewed all recent hosp notes/labs/imaging at length with pt. We will contact you when lab results are available. We will call you next week to discuss Alcohol cessation programs. Continue to social distance and wear a mask when in public. Basic information for mRNA COVID-19 vaccines Please note that these CDC guidelines can change from week to week, and even day to day.  Refer to the  [facility] website for the most current recommendations for any given day at [email or web address]   1) Under the EUAs (emergency use authorization), the following age groups are authorized to receive the COVID-19 vaccination: . Pfizer-BioNTech: ages ?16 years . Moderna: ages ?18 years . Children and adolescents outside of these authorized age groups should not receive COVID-19 vaccination at this time   2) You are not eligible to receive a COVID-19 vaccine if: . You have received another vaccine in the last 14-days (example: flu shot, shingles, tetanus, etc.) . You currently have a positive test for COVID-19. The vaccine must be deferred until you have recovered from the acute illness and the criteria has been met for you to discontinue isolation. . You have received passive antibody therapy (monoclonal antibodies or convalescent serum) as treatment for COVID-19 within the past 90 days   3) Currently COVID-19 vaccines are contraindicated in patients who have  had an anaphylactic reaction to polyethylene glycol ( ie. GoLYTELY bowel prep or MiraLAX ) or polysorbate products (often  used as a thickening agent for many ice creams, frozen custard and whipped dessert products etc).   Also, if you had an anaphylactic reaction to your first COVID-19 vaccination, you cannot receive your second.     - Also if you are currently pregnant please check with your obstetrician to see what their recommendations are regarding vaccination.   - Also, if you have a chronic condition such as CLL, RA or other immunocompromising conditions, please know that no data are currently available (or very limited data) on the safety and efficacy of mRNA COVID-19 vaccines in persons with autoimmune/ immunocompromising conditions.   Please seek the advice of your specialist regarding whether or not you should receive your COVID-19 vaccination.   Call [phone] to schedule vaccine when your phase is called up.  Follow-up in 3 months for chronic medical conditions. Continue with [facility] as directed.  Essential hypertension BP at goa 118/72, HR 79 Propranolol BID No hx of asthma  Osteoporosis Norethindrone-ethinyl estradiol   SVT (supraventricular tachycardia) (HCC) Propranolol BID  Elevated LFTs CMP drawn  Major depressive disorder, recurrent episode, moderate (HCC) Followed by [facility] Duloxetine 52m QD  Alcohol use disorder, severe, dependence ([place] Currently abstaining Recommend In pt rehab or intensive out pt rehab     FOLLOW-UP:  Return in about 3 months (around 07/02/2019) for Regular Follow Up.

## 2019-04-04 NOTE — Patient Instructions (Signed)
Alcohol Abuse and Dependence Information, Adult Alcohol is a widely available drug. People drink alcohol in different amounts. People who drink alcohol very often and in large amounts often have problems during and after drinking. They may develop what is called an alcohol use disorder. There are two main types of alcohol use disorders:  Alcohol abuse. This is when you use alcohol too much or too often. You may use alcohol to make yourself feel happy or to reduce stress. You may have a hard time setting a limit on the amount you drink.  Alcohol dependence. This is when you use alcohol consistently for a period of time, and your body changes as a result. This can make it hard to stop drinking because you may start to feel sick or feel different when you do not use alcohol. These symptoms are known as withdrawal. How can alcohol abuse and dependence affect me? Alcohol abuse and dependence can have a negative effect on your life. Drinking too much can lead to addiction. You may feel like you need alcohol to function normally. You may drink alcohol before work in the morning, during the day, or as soon as you get home from work in the evening. These actions can result in:  Poor work performance.  Job loss.  Financial problems.  Car crashes or criminal charges from driving after drinking alcohol.  Problems in your relationships with friends and family.  Losing the trust and respect of coworkers, friends, and family. Drinking heavily over a long period of time can permanently damage your body and brain, and can cause lifelong health issues, such as:  Damage to your liver or pancreas.  Heart problems, high blood pressure, or stroke.  Certain cancers.  Decreased ability to fight infections.  Brain or nerve damage.  Depression.  Early (premature) death. If you are careless or you crave alcohol, it is easy to drink more than your body can handle (overdose). Alcohol overdose is a serious  situation that requires hospitalization. It may lead to permanent injuries or death. What can increase my risk?  Having a family history of alcohol abuse.  Having depression or other mental health conditions.  Beginning to drink at an early age.  Binge drinking often.  Experiencing trauma, stress, and an unstable home life during childhood.  Spending time with people who drink often. What actions can I take to prevent or manage alcohol abuse and dependence?  Do not drink alcohol if: ? Your health care provider tells you not to drink. ? You are pregnant, may be pregnant, or are planning to become pregnant.  If you drink alcohol: ? Limit how much you use to:  0-1 drink a day for women.  0-2 drinks a day for men. ? Be aware of how much alcohol is in your drink. In the [place] one drink equals one 12 oz bottle of beer (355 mL), one 5 oz glass of wine (148 mL), or one 1 oz glass of hard liquor (44 mL).  Stop drinking if you have been drinking too much. This can be very hard to do if you are used to abusing alcohol. If you begin to have withdrawal symptoms, talk with your health care provider or a person that you trust. These symptoms may include anxiety, shaky hands, headache, nausea, sweating, or not being able to sleep.  Choose to drink nonalcoholic beverages in social gatherings and places where there may be alcohol. Activity  Spend more time on activities that you enjoy that do   not involve alcohol, like hobbies or exercise.  Find healthy ways to cope with stress, such as exercise, meditation, or spending time with people you care about. General information  Talk to your family, coworkers, and friends about supporting you in your efforts to stop drinking. If they drink, ask them not to drink around you. Spend more time with people who do not drink alcohol.  If you think that you have an alcohol dependency problem: ? Tell friends or family about your concerns. ? Talk with your  health care provider or another health professional about where to get help. ? Work with a Transport planner and a Regulatory affairs officer. ? Consider joining a support group for people who struggle with alcohol abuse and dependence. Where to find support   Your health care provider.  SMART Recovery: www.smartrecovery.org Therapy and support groups  Local treatment centers or chemical dependency counselors.  Local AA groups in your community: NicTax.com.pt Where to find more information  Centers for Disease Control and Prevention: http://www.wolf.info/  National Institute on Alcohol Abuse and Alcoholism: http://www.bradshaw.com/  Alcoholics Anonymous (AA): NicTax.com.pt Contact a health care provider if:  You drank more or for longer than you intended on more than one occasion.  You tried to stop drinking or to cut back on how much you drink, but you were not able to.  You often drink to the point of vomiting or passing out.  You want to drink so badly that you cannot think about anything else.  You have problems in your life due to drinking, but you continue to drink.  You keep drinking even though you feel anxious, depressed, or have experienced memory loss.  You have stopped doing the things you used to enjoy in order to drink.  You have to drink more than you used to in order to get the effect you want.  You experience anxiety, sweating, nausea, shakiness, and trouble sleeping when you try to stop drinking. Get help right away if:  You have thoughts about hurting yourself or others.  You have serious withdrawal symptoms, including: ? Confusion. ? Racing heart. ? High blood pressure. ? Fever. If you ever feel like you may hurt yourself or others, or have thoughts about taking your own life, get help right away. You can go to your nearest emergency department or call:  Your local emergency services (911 in the [place]  A suicide crisis helpline, such as the [place] at 5130579244. This is open 24 hours a day. Summary  Alcohol abuse and dependence can have a negative effect on your life. Drinking too much or too often can lead to addiction.  If you drink alcohol, limit how much you use.  If you are having trouble keeping your drinking under control, find ways to change your behavior. Hobbies, calming activities, exercise, or support groups can help.  If you feel you need help with changing your drinking habits, talk with your health care provider, a good friend, or a therapist, or go to an [place] group. This information is not intended to replace advice given to you by your health care provider. Make sure you discuss any questions you have with your health care provider. Document Revised: 06/05/2018 Document Reviewed: 04/24/2018 Elsevier Patient Education  [employer].  We will contact you when lab results are available. We will call you next week to discuss Alcohol cessation programs. Continue to social distance and wear a mask when in public. Basic information for mRNA COVID-19  vaccines Please note that these CDC guidelines can change from week to week, and even day to day.  Refer to the [facility] website for the most current recommendations for any given day at GeekRegister.com.ee   1) Under the EUAs (emergency use authorization), the following age groups are authorized to receive the COVID-19 vaccination: . Pfizer-BioNTech: ages ?16 years . Moderna: ages ?18 years . Children and adolescents outside of these authorized age groups should not receive COVID-19 vaccination at this time   2) You are not eligible to receive a COVID-19 vaccine if: . You have received another vaccine in the last 14-days (example: flu shot, shingles, tetanus, etc.) . You currently have a positive test for COVID-19. The vaccine must be deferred until you have recovered from the acute illness and the criteria has been met for you to discontinue  isolation. . You have received passive antibody therapy (monoclonal antibodies or convalescent serum) as treatment for COVID-19 within the past 90 days   3) Currently COVID-19 vaccines are contraindicated in patients who have had an anaphylactic reaction to polyethylene glycol ( ie. GoLYTELY bowel prep or MiraLAX ) or polysorbate products (often used as a thickening agent for many ice creams, frozen custard and whipped dessert products etc).   Also, if you had an anaphylactic reaction to your first COVID-19 vaccination, you cannot receive your second.     - Also if you are currently pregnant please check with your obstetrician to see what their recommendations are regarding vaccination.   - Also, if you have a chronic condition such as CLL, RA or other immunocompromising conditions, please know that no data are currently available (or very limited data) on the safety and efficacy of mRNA COVID-19 vaccines in persons with autoimmune/ immunocompromising conditions.   Please seek the advice of your specialist regarding whether or not you should receive your COVID-19 vaccination.   Call [phone] to schedule vaccine when your phase is called up.  Follow-up in 3 months for chronic medical conditions. Continue with [facility] as directed.  GREAT TO SEE YOU!

## 2019-04-04 NOTE — Assessment & Plan Note (Addendum)
Reviewed all recent hosp notes/labs/imaging at length with pt. We will contact you when lab results are available. We will call you next week to discuss Alcohol cessation programs. Continue to social distance and wear a mask when in public. Basic information for mRNA COVID-19 vaccines Please note that these CDC guidelines can change from week to week, and even day to day.  Refer to the [facility] website for the most current recommendations for any given day at GeekRegister.com.ee   1) Under the EUAs (emergency use authorization), the following age groups are authorized to receive the COVID-19 vaccination: . Pfizer-BioNTech: ages ?16 years . Moderna: ages ?18 years . Children and adolescents outside of these authorized age groups should not receive COVID-19 vaccination at this time   2) You are not eligible to receive a COVID-19 vaccine if: . You have received another vaccine in the last 14-days (example: flu shot, shingles, tetanus, etc.) . You currently have a positive test for COVID-19. The vaccine must be deferred until you have recovered from the acute illness and the criteria has been met for you to discontinue isolation. . You have received passive antibody therapy (monoclonal antibodies or convalescent serum) as treatment for COVID-19 within the past 90 days   3) Currently COVID-19 vaccines are contraindicated in patients who have had an anaphylactic reaction to polyethylene glycol ( ie. GoLYTELY bowel prep or MiraLAX ) or polysorbate products (often used as a thickening agent for many ice creams, frozen custard and whipped dessert products etc).   Also, if you had an anaphylactic reaction to your first COVID-19 vaccination, you cannot receive your second.     - Also if you are currently pregnant please check with your obstetrician to see what their recommendations are regarding vaccination.   - Also, if you have a chronic condition such as CLL, RA or other  immunocompromising conditions, please know that no data are currently available (or very limited data) on the safety and efficacy of mRNA COVID-19 vaccines in persons with autoimmune/ immunocompromising conditions.   Please seek the advice of your specialist regarding whether or not you should receive your COVID-19 vaccination.   Call [phone] to schedule vaccine when your phase is called up.  Follow-up in 3 months for chronic medical conditions. Continue with [facility] as directed.

## 2019-04-05 ENCOUNTER — Encounter: Payer: Self-pay | Admitting: Adult Health

## 2019-04-05 DIAGNOSIS — F1010 Alcohol abuse, uncomplicated: Secondary | ICD-10-CM | POA: Insufficient documentation

## 2019-04-05 LAB — CBC WITH DIFFERENTIAL/PLATELET
Basophils Absolute: 0.1 10*3/uL (ref 0.0–0.2)
Basos: 1 %
EOS (ABSOLUTE): 0.2 10*3/uL (ref 0.0–0.4)
Eos: 3 %
Hematocrit: 36.7 % (ref 34.0–46.6)
Hemoglobin: 12.4 g/dL (ref 11.1–15.9)
Immature Grans (Abs): 0 10*3/uL (ref 0.0–0.1)
Immature Granulocytes: 0 %
Lymphocytes Absolute: 1.4 10*3/uL (ref 0.7–3.1)
Lymphs: 16 %
MCH: 32.2 pg (ref 26.6–33.0)
MCHC: 33.8 g/dL (ref 31.5–35.7)
MCV: 95 fL (ref 79–97)
Monocytes Absolute: 0.9 10*3/uL (ref 0.1–0.9)
Monocytes: 10 %
Neutrophils Absolute: 6.2 10*3/uL (ref 1.4–7.0)
Neutrophils: 70 %
Platelets: 343 10*3/uL (ref 150–450)
RBC: 3.85 x10E6/uL (ref 3.77–5.28)
RDW: 13.2 % (ref 11.7–15.4)
WBC: 8.9 10*3/uL (ref 3.4–10.8)

## 2019-04-05 LAB — COMPREHENSIVE METABOLIC PANEL
ALT: 12 IU/L (ref 0–32)
AST: 28 IU/L (ref 0–40)
Albumin/Globulin Ratio: 1.8 (ref 1.2–2.2)
Albumin: 4.4 g/dL (ref 3.8–4.8)
Alkaline Phosphatase: 52 IU/L (ref 39–117)
BUN/Creatinine Ratio: 30 — ABNORMAL HIGH (ref 12–28)
BUN: 22 mg/dL (ref 8–27)
Bilirubin Total: 0.3 mg/dL (ref 0.0–1.2)
CO2: 26 mmol/L (ref 20–29)
Calcium: 9.6 mg/dL (ref 8.7–10.3)
Chloride: 101 mmol/L (ref 96–106)
Creatinine, Ser: 0.73 mg/dL (ref 0.57–1.00)
GFR calc Af Amer: 101 mL/min/{1.73_m2} (ref >59)
GFR calc non Af Amer: 87 mL/min/{1.73_m2} (ref >59)
Globulin, Total: 2.4 g/dL (ref 1.5–4.5)
Glucose: 88 mg/dL (ref 65–99)
Potassium: 4.5 mmol/L (ref 3.5–5.2)
Sodium: 140 mmol/L (ref 134–144)
Total Protein: 6.8 g/dL (ref 6.0–8.5)

## 2019-04-05 NOTE — Assessment & Plan Note (Signed)
Currently abstaining Recommend In pt rehab or intensive out pt rehab

## 2019-04-05 NOTE — Assessment & Plan Note (Signed)
Propranolol BID

## 2019-04-05 NOTE — Assessment & Plan Note (Signed)
BP at [facility] 118/72, HR 79 Propranolol BID No hx of asthma

## 2019-04-05 NOTE — Assessment & Plan Note (Signed)
CMP drawn

## 2019-04-05 NOTE — Assessment & Plan Note (Signed)
Norethindrone-ethinyl estradiol

## 2019-04-05 NOTE — Assessment & Plan Note (Signed)
Followed by Beverly Sessions Duloxetine 60mg  QD

## 2019-04-06 ENCOUNTER — Other Ambulatory Visit: Payer: Self-pay | Admitting: Adult Health

## 2019-04-11 ENCOUNTER — Telehealth: Payer: Self-pay | Admitting: Adult Health

## 2019-04-11 NOTE — Telephone Encounter (Signed)
Patient request Terri Ayala call her back regarding :   propranolol (INDERAL) 10 MG tablet [id number]   Order Details Dose: 10 mg Route: Oral Frequency: 2 times daily  Dispense Quantity: 30 tablet Refills: 0       Sig: Take 1 tablet (10 mg total) by mouth 2 (two) times daily.       --Per patient thought Rx approval was for more than 30 pills.  Forwarding message to med asst for review & to contact pt @ [phone].

## 2019-04-12 MED ORDER — PROPRANOLOL HCL 10 MG PO TABS: 10.0000 mg | ORAL_TABLET | Freq: Two times a day (BID) | ORAL | 0 refills | Status: DC

## 2019-04-12 NOTE — Telephone Encounter (Signed)
RX resent to pharmacy for 90 day supply.  Pt informed.  Charyl Bigger, CMA

## 2019-04-17 ENCOUNTER — Ambulatory Visit
Admission: RE | Admit: 2019-04-17 | Discharge: 2019-04-17 | Disposition: A | Discharge status: Home / Self Care | Payer: Federal, State, Local not specified - PPO | Source: Ambulatory Visit | Attending: Obstetrics and Gynecology | Admitting: Obstetrics and Gynecology

## 2019-04-17 ENCOUNTER — Other Ambulatory Visit: Payer: Self-pay

## 2019-04-17 DIAGNOSIS — E2839 Other primary ovarian failure: Secondary | ICD-10-CM

## 2019-04-17 DIAGNOSIS — M85851 Other specified disorders of bone density and structure, right thigh: Secondary | ICD-10-CM | POA: Diagnosis not present

## 2019-04-17 DIAGNOSIS — Z780 Asymptomatic menopausal state: Secondary | ICD-10-CM | POA: Diagnosis not present

## 2019-06-06 DIAGNOSIS — F1020 Alcohol dependence, uncomplicated: Secondary | ICD-10-CM | POA: Diagnosis not present

## 2019-06-06 DIAGNOSIS — F3341 Major depressive disorder, recurrent, in partial remission: Secondary | ICD-10-CM | POA: Diagnosis not present

## 2019-06-06 DIAGNOSIS — F419 Anxiety disorder, unspecified: Secondary | ICD-10-CM | POA: Diagnosis not present

## 2019-07-08 ENCOUNTER — Other Ambulatory Visit: Payer: Self-pay | Admitting: Physician Assistant

## 2019-07-08 ENCOUNTER — Other Ambulatory Visit: Payer: Self-pay | Admitting: Adult Health

## 2019-07-08 DIAGNOSIS — G43009 Migraine without aura, not intractable, without status migrainosus: Secondary | ICD-10-CM

## 2019-08-06 ENCOUNTER — Other Ambulatory Visit: Payer: Self-pay | Admitting: Physician Assistant

## 2019-08-06 DIAGNOSIS — G43009 Migraine without aura, not intractable, without status migrainosus: Secondary | ICD-10-CM

## 2019-08-06 MED ORDER — SUMATRIPTAN SUCCINATE 100 MG PO TABS: ORAL_TABLET | ORAL | 0 refills | Status: DC

## 2019-08-06 NOTE — Telephone Encounter (Signed)
Patient called to set up OV/ Appt for refill on :   SUMAtriptan (IMITREX) 100 MG tablet [[id number]]   Order Details Dose, Route, Frequency: As Directed  Dispense Quantity: 10 tablet Refills: 0       Sig: TAKE 1 TABLET BY MOUTH AT THE FIRST SIGN OF MIGRAINE, MAY REPEAT IN 2 HOURS. MAX 2 TABLETS IN 24 HOURS.**PATIENT NEEDS APT FOR FURTHER REFILLS**   If approved send refill order to :   [place], [place] - [place] [phone] (Phone) [phone] (Fax)   --glh

## 2019-08-06 NOTE — Telephone Encounter (Signed)
Last refill of Sumatriptan was given 07/08/19 # 10 with no refills and patient was advised needed and OV for further refills.   Patient has apt scheduled 08/21/19 but is requesting medication refill for now.    Please approve if refill deemed appropriate. AS, CMA

## 2019-08-14 ENCOUNTER — Other Ambulatory Visit: Payer: Self-pay | Admitting: Physician Assistant

## 2019-08-14 MED ORDER — PANTOPRAZOLE SODIUM 40 MG PO TBEC: 40.0000 mg | DELAYED_RELEASE_TABLET | Freq: Two times a day (BID) | ORAL | 0 refills | Status: DC

## 2019-08-21 ENCOUNTER — Ambulatory Visit: Payer: Federal, State, Local not specified - PPO | Admitting: Physician Assistant

## 2019-08-28 ENCOUNTER — Encounter: Payer: Self-pay | Admitting: Physician Assistant

## 2019-08-28 ENCOUNTER — Other Ambulatory Visit: Payer: Self-pay

## 2019-08-28 ENCOUNTER — Ambulatory Visit: Payer: Federal, State, Local not specified - PPO | Admitting: Physician Assistant

## 2019-08-28 VITALS — BP 121/79 | HR 77 | Temp 98.4°F | Ht 62.0 in | Wt 138.0 lb

## 2019-08-28 DIAGNOSIS — I10 Essential (primary) hypertension: Secondary | ICD-10-CM | POA: Diagnosis not present

## 2019-08-28 DIAGNOSIS — F331 Major depressive disorder, recurrent, moderate: Secondary | ICD-10-CM | POA: Diagnosis not present

## 2019-08-28 DIAGNOSIS — R350 Frequency of micturition: Secondary | ICD-10-CM | POA: Diagnosis not present

## 2019-08-28 DIAGNOSIS — F1020 Alcohol dependence, uncomplicated: Secondary | ICD-10-CM

## 2019-08-28 DIAGNOSIS — G43009 Migraine without aura, not intractable, without status migrainosus: Secondary | ICD-10-CM

## 2019-08-28 DIAGNOSIS — R300 Dysuria: Secondary | ICD-10-CM | POA: Diagnosis not present

## 2019-08-28 LAB — POCT URINALYSIS DIPSTICK
Bilirubin, UA: NEGATIVE
Blood, UA: NEGATIVE
Glucose, UA: NEGATIVE
Ketones, UA: NEGATIVE
Nitrite, UA: NEGATIVE
Protein, UA: POSITIVE — AB
Spec Grav, UA: 1.015 (ref 1.010–1.025)
Urobilinogen, UA: 0.2 E.U./dL
pH, UA: 8.5 — AB (ref 5.0–8.0)

## 2019-08-28 NOTE — Assessment & Plan Note (Signed)
-  Continue AA meetings and abstinence.

## 2019-08-28 NOTE — Patient Instructions (Signed)
Migraine Headache A migraine headache is an intense, throbbing pain on one side or both sides of the head. Migraine headaches may also cause other symptoms, such as nausea, vomiting, and sensitivity to light and noise. A migraine headache can last from 4 hours to 3 days. Talk with your doctor about what things may bring on (trigger) your migraine headaches. What are the causes? The exact cause of this condition is not known. However, a migraine may be caused when nerves in the brain become irritated and release chemicals that cause inflammation of blood vessels. This inflammation causes pain. This condition may be triggered or caused by:  Drinking alcohol.  Smoking.  Taking medicines, such as: ? Medicine used to treat chest pain (nitroglycerin). ? Birth control pills. ? Estrogen. ? Certain blood pressure medicines.  Eating or drinking products that contain nitrates, glutamate, aspartame, or tyramine. Aged cheeses, chocolate, or caffeine may also be triggers.  Doing physical activity. Other things that may trigger a migraine headache include:  Menstruation.  Pregnancy.  Hunger.  Stress.  Lack of sleep or too much sleep.  Weather changes.  Fatigue. What increases the risk? The following factors may make you more likely to experience migraine headaches:  Being a certain age. This condition is more common in people who are 25-55 years old.  Being female.  Having a family history of migraine headaches.  Being Caucasian.  Having a mental health condition, such as depression or anxiety.  Being obese. What are the signs or symptoms? The main symptom of this condition is pulsating or throbbing pain. This pain may:  Happen in any area of the head, such as on one side or both sides.  Interfere with daily activities.  Get worse with physical activity.  Get worse with exposure to bright lights or loud noises. Other symptoms may  include:  Nausea.  Vomiting.  Dizziness.  General sensitivity to bright lights, loud noises, or smells. Before you get a migraine headache, you may get warning signs (an aura). An aura may include:  Seeing flashing lights or having blind spots.  Seeing bright spots, halos, or zigzag lines.  Having tunnel vision or blurred vision.  Having numbness or a tingling feeling.  Having trouble talking.  Having muscle weakness. Some people have symptoms after a migraine headache (postdromal phase), such as:  Feeling tired.  Difficulty concentrating. How is this diagnosed? A migraine headache can be diagnosed based on:  Your symptoms.  A physical exam.  Tests, such as: ? CT scan or an MRI of the head. These imaging tests can help rule out other causes of headaches. ? Taking fluid from the spine (lumbar puncture) and analyzing it (cerebrospinal fluid analysis, or CSF analysis). How is this treated? This condition may be treated with medicines that:  Relieve pain.  Relieve nausea.  Prevent migraine headaches. Treatment for this condition may also include:  Acupuncture.  Lifestyle changes like avoiding foods that trigger migraine headaches.  Biofeedback.  Cognitive behavioral therapy. Follow these instructions at home: Medicines  Take over-the-counter and prescription medicines only as told by your health care provider.  Ask your health care provider if the medicine prescribed to you: ? Requires you to avoid driving or using heavy machinery. ? Can cause constipation. You may need to take these actions to prevent or treat constipation:  Drink enough fluid to keep your urine pale yellow.  Take over-the-counter or prescription medicines.  Eat foods that are high in fiber, such as beans, whole grains, and   fresh fruits and vegetables.  Limit foods that are high in fat and processed sugars, such as fried or sweet foods. Lifestyle  Do not drink alcohol.  Do not  use any products that contain nicotine or tobacco, such as cigarettes, e-cigarettes, and chewing tobacco. If you need help quitting, ask your health care provider.  Get at least 8 hours of sleep every night.  Find ways to manage stress, such as meditation, deep breathing, or yoga. General instructions      Keep a journal to find out what may trigger your migraine headaches. For example, write down: ? What you eat and drink. ? How much sleep you get. ? Any change to your diet or medicines.  If you have a migraine headache: ? Avoid things that make your symptoms worse, such as bright lights. ? It may help to lie down in a dark, quiet room. ? Do not drive or use heavy machinery. ? Ask your health care provider what activities are safe for you while you are experiencing symptoms.  Keep all follow-up visits as told by your health care provider. This is important. Contact a health care provider if:  You develop symptoms that are different or more severe than your usual migraine headache symptoms.  You have more than 15 headache days in one month. Get help right away if:  Your migraine headache becomes severe.  Your migraine headache lasts longer than 72 hours.  You have a fever.  You have a stiff neck.  You have vision loss.  Your muscles feel weak or like you cannot control them.  You start to lose your balance often.  You have trouble walking.  You faint.  You have a seizure. Summary  A migraine headache is an intense, throbbing pain on one side or both sides of the head. Migraines may also cause other symptoms, such as nausea, vomiting, and sensitivity to light and noise.  This condition may be treated with medicines and lifestyle changes. You may also need to avoid certain things that trigger a migraine headache.  Keep a journal to find out what may trigger your migraine headaches.  Contact your health care provider if you have more than 15 headache days in a  month or you develop symptoms that are different or more severe than your usual migraine headache symptoms. This information is not intended to replace advice given to you by your health care provider. Make sure you discuss any questions you have with your health care provider. Document Revised: 06/08/2018 Document Reviewed: 03/29/2018 Elsevier Patient Education  2020 [employer]  

## 2019-08-28 NOTE — Progress Notes (Signed)
Established Patient Office Visit  Subjective:  Patient ID: Terri Ayala, female    DOB: Aug 20, 1955  Age: 64 y.o. MRN: [id number]  CC:  Chief Complaint  Patient presents with  . Hypertension    HPI Terri Ayala presents for follow-up on hypertension.  HTN: Pt denies chest pain, palpitations, dizziness or lower extremity swelling. Taking medication as directed without side effects.  Reports she forgot to bring in a Medicare form to get a free upper arm blood pressure cuff and to will drop it off later this week.  Dysuria: Reports pain with urination and urinary frequency that has been on and off for couple weeks.  Denies abdominal pain, fever, or back pain.  Migraines: Stable, she takes Imitrex as needed and currently does not need a refill.  Alcohol use disorder: Patient states she has remained sober since the beginning of the year. She continues AA meetings which are currently online.   Past Medical History:  Diagnosis Date  . Alcohol withdrawal ([place]) 11/2017; 01/04/2018  . Alcoholism ([place])   . Anxiety   . Arthritis    "hands" (01/04/2018)  . ASYMPTOMATIC POSTMENOPAUSAL STATUS 08/17/2009  . Chronic lower back pain   . Depression   . DYSPHAGIA 06/19/2007  . Fatty liver, alcoholic   . GERD (gastroesophageal reflux disease)   . GOITER, MULTINODULAR 08/17/2009  . Hyperlipidemia   . Migraines    "not sure what triggers them; I'll have 1 q couple weeks or more; come 2 days in a row when they come" (01/04/2018)  . Mitral valve prolapse   . OSTEOPOROSIS 08/17/2009  . PONV (postoperative nausea and vomiting)   . Supraventricular tachycardia [facility])     Past Surgical History:  Procedure Laterality Date  . BREAST BIOPSY Right    "benign"  . CATARACT EXTRACTION W/ INTRAOCULAR LENS  IMPLANT, BILATERAL    . CERVICAL CONE BIOPSY  2000s  . CERVIX LESION DESTRUCTION  1991   "dysplasia; lesions"  . CHOLECYSTECTOMY N/A 03/14/2013   Procedure: LAPAROSCOPIC CHOLECYSTECTOMY WITH ATTEMPTED  INTRAOPERATIVE CHOLANGIOGRAM;  Surgeon: Harl Bowie, MD;  Location: [place];  Service: General;  Laterality: N/A;  . laporoscopic abdominal surgery     "for endometriosis"  . LEFT HEART CATH AND CORONARY ANGIOGRAPHY N/A 05/07/2018   Procedure: LEFT HEART CATH AND CORONARY ANGIOGRAPHY;  Surgeon: Belva Crome, MD;  Location: [place] [facility];  Service: Cardiovascular;  Laterality: N/A;    Family History  Problem Relation Age of Onset  . Colon cancer Father        Deceased, 85  . Osteoporosis Mother        Living, 16  . Healthy Brother   . Healthy Brother   . Healthy Son   . Healthy Son   . Thyroid disease Neg Hx   . Goiter Neg Hx     Social History   Socioeconomic History  . Marital status: Single    Spouse name: Not on file  . Number of children: Not on file  . Years of education: Not on file  . Highest education level: Not on file  Occupational History  . Occupation: Brewing technologist - retired    Fish farm manager: [employer]  Tobacco Use  . Smoking status: Former Smoker    Packs/day: 1.00    Years: 7.00    Pack years: 7.00    Types: Cigarettes    Quit date: 02/28/1974    Years since quitting: 45.5  . Smokeless tobacco: Never Used  .  Tobacco comment: 01/04/2018 "smoked when I was a teenager"  Vaping Use  . Vaping Use: Never used  Substance and Sexual Activity  . Alcohol use: Yes    Alcohol/week: 28.0 standard drinks    Types: 28 Glasses of wine per week    Comment: 01/04/2018 "1 1/2 bottles of wine/day"  . Drug use: Never  . Sexual activity: Not Currently  Other Topics Concern  . Not on file  Social History Narrative   Lives alone.  She has a BS biology.   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   Social Determinants of Health   Financial Resource Strain:   . Difficulty of Paying Living Expenses:   Food Insecurity:   . Worried About Charity fundraiser in the Last Year:   . Arboriculturist in the Last Year:   Transportation  Needs:   . Film/video editor (Medical):   [name] Kitchen Lack of Transportation (Non-Medical):   Physical Activity:   . Days of Exercise per Week:   . Minutes of Exercise per Session:   Stress:   . Feeling of Stress :   Social Connections:   . Frequency of Communication with Friends and Family:   . Frequency of Social Gatherings with Friends and Family:   . Attends Religious Services:   . Active Member of Clubs or Organizations:   . Attends Archivist Meetings:   [name] Kitchen Marital Status:   Intimate Partner Violence:   . Fear of Current or Ex-Partner:   . Emotionally Abused:   [name] Kitchen Physically Abused:   . Sexually Abused:     Outpatient Medications Prior to Visit  Medication Sig Dispense Refill  . alendronate (FOSAMAX) 70 MG tablet Take 70 mg by mouth every Monday. Take with a full glass of water on an empty stomach.     [name] Kitchen aspirin EC 81 MG tablet Take 81 mg by mouth daily.    . busPIRone (BUSPAR) 15 MG tablet Take 15 mg by mouth 3 (three) times daily.    . chlordiazePOXIDE (LIBRIUM) 25 MG capsule 50mg  PO TID x 1D, then 25-50mg  PO BID X 1D, then 25-50mg  PO QD X 1D 10 capsule 0  . DULoxetine (CYMBALTA) 60 MG capsule TAKE 1 CAPSULE (60 MG TOTAL) BY MOUTH DAILY. (Patient taking differently: Take 60 mg by mouth 2 (two) times daily. ) 30 capsule 0  . folic acid (FOLVITE) 1 MG tablet Take 1 tablet (1 mg total) by mouth daily. 30 tablet 0  . Multiple Vitamin (MULTIVITAMIN WITH MINERALS) TABS tablet Take 1 tablet by mouth daily. 30 tablet 0  . norethindrone-ethinyl estradiol (FEMHRT 1/5) 1-5 MG-MCG TABS Take 1 tablet by mouth daily. For Osteoporosis 28 tablet   . pantoprazole (PROTONIX) 40 MG tablet Take 1 tablet (40 mg total) by mouth 2 (two) times daily. **PATIENT NEEDS APT FOR FURTHER REFILLS** 60 tablet 0  . propranolol (INDERAL) 10 MG tablet TAKE 1 TABLET BY MOUTH 2 TIMES DAILY. 180 tablet 0  . SUMAtriptan (IMITREX) 100 MG tablet TAKE 1 TABLET BY MOUTH AT THE FIRST SIGN OF MIGRAINE, MAY REPEAT IN  2 HOURS. MAX 2 TABLETS IN 24 HOURS.**PATIENT NEEDS APT FOR FURTHER REFILLS** 10 tablet 0  . traZODone (DESYREL) 50 MG tablet Take 50 mg by mouth at bedtime as needed for sleep.      No facility-administered medications prior to visit.    Allergies  Allergen Reactions  . Doxycycline Swelling    Mouth swelling  and sores  . Codeine Itching  . Tetracycline Hcl Swelling    Mouth swelling and sores    ROS Review of Systems Review of Systems:  A fourteen system review of systems was performed and found to be positive as per HPI.  Objective:   Physical exam: General: Well nourished, in no apparent distress. Eyes: PERRLA, EOMs, conjunctiva clr Resp: Respiratory effort- normal, ECTA B/L w/o W/R/R  Cardio: RRR w/o MRGs. Abdomen: no gross distention. No TTP, NBS, no CVA tenderness Lymphatics:  less 2 sec cap RF M-sk: Full ROM, good strength, normal gait.  Skin: Warm, dry  Neuro: Alert, Oriented Psych: Normal affect, Insight and Judgment appropriate.    BP 121/79   Pulse 77   Temp 98.4 F (36.9 C) (Oral)   Ht 5\' 2"  (1.575 m)   Wt 138 lb (62.6 kg)   SpO2 100%   BMI 25.24 kg/m  Wt Readings from Last 3 Encounters:  08/28/19 138 lb (62.6 kg)  04/04/19 136 lb 9.6 oz (62 kg)  03/16/19 125 lb (56.7 kg)     Health Maintenance Due  Topic Date Due  . COVID-19 Vaccine (1) Never done  . PAP SMEAR-Modifier  02/11/2018  . MAMMOGRAM  02/28/2019    There are no preventive care reminders to display for this patient.  Lab Results  Component Value Date   TSH 4.700 (H) 07/09/2018   Lab Results  Component Value Date   WBC 8.9 04/04/2019   HGB 12.4 04/04/2019   HCT 36.7 04/04/2019   MCV 95 04/04/2019   PLT 343 04/04/2019   Lab Results  Component Value Date   NA 140 04/04/2019   K 4.5 04/04/2019   CO2 26 04/04/2019   GLUCOSE 88 04/04/2019   BUN 22 04/04/2019   CREATININE 0.73 04/04/2019   BILITOT 0.3 04/04/2019   ALKPHOS 52 04/04/2019   AST 28 04/04/2019   ALT 12  04/04/2019   PROT 6.8 04/04/2019   ALBUMIN 4.4 04/04/2019   CALCIUM 9.6 04/04/2019   ANIONGAP 13 03/21/2019   Lab Results  Component Value Date   CHOL 190 07/09/2018   Lab Results  Component Value Date   HDL 92 07/09/2018   Lab Results  Component Value Date   LDLCALC 78 07/09/2018   Lab Results  Component Value Date   TRIG 100 07/09/2018   Lab Results  Component Value Date   CHOLHDL 2.1 07/09/2018   Lab Results  Component Value Date   HGBA1C 4.7 (L) 07/09/2018      Assessment & Plan:   Problem List Items Addressed This Visit      Cardiovascular and Mediastinum   Migraine headache    -Stable. -Continue Imitrex as needed for acute rescue.      Essential hypertension - Primary    - BP today is 121/79 heart rate 77, at goal. - Continue propanolol. -Encourage ambulatory BP and pulse monitoring and advised to bring form for free upper arm BP. - Follow DASH diet. - Encourage to stay as active as possible.         Other   Alcohol use disorder, severe, dependence ([place])    -Continue AA meetings and abstinence.      Major depressive disorder, recurrent episode, moderate ([facility])    -Stable, PHQ-9 score of 0 -Continue to follow-up with [facility]. -Continue current medication regimen.       Other Visit Diagnoses    Dysuria       Relevant Orders  POCT urinalysis dipstick (Completed)   Urine Culture (Completed)   Urinary frequency         Dysuria, urinary frequency: -UA revealed trace of leukocytes, negative nitrites, and negative hematuria. Will send for urine culture to rule out UTI. Pending urine culture results will start antibiotic if indicated -Recommend to stay well hydrated, at least 64 fl oz. -If symptoms worsen or fail to improve RTC.   No orders of the defined types were placed in this encounter.   Follow-up: Return in about 4 months (around 12/28/2019) for [place] and [place].    Lorrene Reid, PA-C

## 2019-08-28 NOTE — Assessment & Plan Note (Signed)
-   BP today is 121/79 heart rate 77, at goal. - Continue propanolol. -Encourage ambulatory BP and pulse monitoring and advised to bring form for free upper arm BP. - Follow DASH diet. - Encourage to stay as active as possible.

## 2019-08-28 NOTE — Assessment & Plan Note (Signed)
-  Stable, PHQ-9 score of 0 -Continue to follow-up with [facility]. -Continue current medication regimen.

## 2019-08-28 NOTE — Assessment & Plan Note (Signed)
-  Stable. -Continue Imitrex as needed for acute rescue.

## 2019-08-30 DIAGNOSIS — F3341 Major depressive disorder, recurrent, in partial remission: Secondary | ICD-10-CM | POA: Diagnosis not present

## 2019-08-30 DIAGNOSIS — F1020 Alcohol dependence, uncomplicated: Secondary | ICD-10-CM | POA: Diagnosis not present

## 2019-08-30 DIAGNOSIS — F419 Anxiety disorder, unspecified: Secondary | ICD-10-CM | POA: Diagnosis not present

## 2019-08-30 LAB — URINE CULTURE

## 2019-09-12 ENCOUNTER — Other Ambulatory Visit: Payer: Self-pay | Admitting: Physician Assistant

## 2019-09-23 ENCOUNTER — Telehealth: Payer: Self-pay | Admitting: Physician Assistant

## 2019-09-23 MED ORDER — PANTOPRAZOLE SODIUM 40 MG PO TBEC: 40.0000 mg | DELAYED_RELEASE_TABLET | Freq: Two times a day (BID) | ORAL | 1 refills | Status: DC

## 2019-09-23 NOTE — Addendum Note (Signed)
Addended by: Fonnie Mu on: 09/23/2019 04:39 PM   Modules accepted: Orders

## 2019-09-23 NOTE — Telephone Encounter (Signed)
Patient is requesting a refill of her Protonix, if approved please send to [facility].

## 2019-10-08 ENCOUNTER — Other Ambulatory Visit: Payer: Self-pay

## 2019-10-08 ENCOUNTER — Ambulatory Visit (INDEPENDENT_AMBULATORY_CARE_PROVIDER_SITE_OTHER): Payer: Medicare Other | Admitting: Physician Assistant

## 2019-10-08 VITALS — BP 80/44

## 2019-10-08 DIAGNOSIS — I10 Essential (primary) hypertension: Secondary | ICD-10-CM | POA: Diagnosis not present

## 2019-10-08 NOTE — Progress Notes (Signed)
Pt here to have bicep circumference and blood pressure measured for order form to [employer] to obtain a home BP cuff.  Bicep of left arm was 10.5in.  BP 80/44.  Form completed and given to [place] for faxing.  Charyl Bigger, CMA

## 2019-10-14 ENCOUNTER — Other Ambulatory Visit: Payer: Self-pay | Admitting: Physician Assistant

## 2019-12-23 ENCOUNTER — Other Ambulatory Visit: Payer: Federal, State, Local not specified - PPO

## 2019-12-27 ENCOUNTER — Ambulatory Visit: Payer: Federal, State, Local not specified - PPO | Admitting: Physician Assistant

## 2020-01-09 ENCOUNTER — Other Ambulatory Visit: Payer: Self-pay

## 2020-01-09 DIAGNOSIS — K760 Fatty (change of) liver, not elsewhere classified: Secondary | ICD-10-CM

## 2020-01-09 DIAGNOSIS — E872 Acidosis, unspecified: Secondary | ICD-10-CM

## 2020-01-09 DIAGNOSIS — F1020 Alcohol dependence, uncomplicated: Secondary | ICD-10-CM

## 2020-01-09 DIAGNOSIS — E042 Nontoxic multinodular goiter: Secondary | ICD-10-CM

## 2020-01-09 DIAGNOSIS — D72829 Elevated white blood cell count, unspecified: Secondary | ICD-10-CM

## 2020-01-09 DIAGNOSIS — I10 Essential (primary) hypertension: Secondary | ICD-10-CM

## 2020-01-09 DIAGNOSIS — R7989 Other specified abnormal findings of blood chemistry: Secondary | ICD-10-CM

## 2020-01-10 ENCOUNTER — Other Ambulatory Visit: Payer: Federal, State, Local not specified - PPO

## 2020-01-10 ENCOUNTER — Other Ambulatory Visit: Payer: Self-pay

## 2020-01-10 DIAGNOSIS — E872 Acidosis, unspecified: Secondary | ICD-10-CM

## 2020-01-10 DIAGNOSIS — R7989 Other specified abnormal findings of blood chemistry: Secondary | ICD-10-CM

## 2020-01-10 DIAGNOSIS — I10 Essential (primary) hypertension: Secondary | ICD-10-CM

## 2020-01-10 DIAGNOSIS — F1020 Alcohol dependence, uncomplicated: Secondary | ICD-10-CM

## 2020-01-10 DIAGNOSIS — E042 Nontoxic multinodular goiter: Secondary | ICD-10-CM

## 2020-01-10 DIAGNOSIS — D72829 Elevated white blood cell count, unspecified: Secondary | ICD-10-CM

## 2020-01-10 DIAGNOSIS — K760 Fatty (change of) liver, not elsewhere classified: Secondary | ICD-10-CM

## 2020-01-11 LAB — COMPREHENSIVE METABOLIC PANEL
ALT: 13 IU/L (ref 0–32)
AST: 44 IU/L — ABNORMAL HIGH (ref 0–40)
Albumin/Globulin Ratio: 1.9 (ref 1.2–2.2)
Albumin: 4.8 g/dL (ref 3.8–4.8)
Alkaline Phosphatase: 90 IU/L (ref 44–121)
BUN/Creatinine Ratio: 14 (ref 12–28)
BUN: 11 mg/dL (ref 8–27)
Bilirubin Total: 0.3 mg/dL (ref 0.0–1.2)
CO2: 25 mmol/L (ref 20–29)
Calcium: 9.3 mg/dL (ref 8.7–10.3)
Chloride: 96 mmol/L (ref 96–106)
Creatinine, Ser: 0.76 mg/dL (ref 0.57–1.00)
GFR calc Af Amer: 95 mL/min/{1.73_m2} (ref >59)
GFR calc non Af Amer: 83 mL/min/{1.73_m2} (ref >59)
Globulin, Total: 2.5 g/dL (ref 1.5–4.5)
Glucose: 92 mg/dL (ref 65–99)
Potassium: 4.3 mmol/L (ref 3.5–5.2)
Sodium: 136 mmol/L (ref 134–144)
Total Protein: 7.3 g/dL (ref 6.0–8.5)

## 2020-01-11 LAB — LIPID PANEL
Chol/HDL Ratio: 4.6 ratio — ABNORMAL HIGH (ref 0.0–4.4)
Cholesterol, Total: 309 mg/dL — ABNORMAL HIGH (ref 100–199)
HDL: 67 mg/dL (ref >39)
LDL Chol Calc (NIH): 202 mg/dL — ABNORMAL HIGH (ref 0–99)
Triglycerides: 209 mg/dL — ABNORMAL HIGH (ref 0–149)
VLDL Cholesterol Cal: 40 mg/dL (ref 5–40)

## 2020-01-11 LAB — CBC WITH DIFFERENTIAL/PLATELET
Basophils Absolute: 0.1 10*3/uL (ref 0.0–0.2)
Basos: 1 %
EOS (ABSOLUTE): 0.1 10*3/uL (ref 0.0–0.4)
Eos: 1 %
Hematocrit: 41.5 % (ref 34.0–46.6)
Hemoglobin: 13.9 g/dL (ref 11.1–15.9)
Immature Grans (Abs): 0 10*3/uL (ref 0.0–0.1)
Immature Granulocytes: 0 %
Lymphocytes Absolute: 1.9 10*3/uL (ref 0.7–3.1)
Lymphs: 28 %
MCH: 31.9 pg (ref 26.6–33.0)
MCHC: 33.5 g/dL (ref 31.5–35.7)
MCV: 95 fL (ref 79–97)
Monocytes Absolute: 0.9 10*3/uL (ref 0.1–0.9)
Monocytes: 12 %
Neutrophils Absolute: 3.9 10*3/uL (ref 1.4–7.0)
Neutrophils: 58 %
Platelets: 241 10*3/uL (ref 150–450)
RBC: 4.36 x10E6/uL (ref 3.77–5.28)
RDW: 13.1 % (ref 11.7–15.4)
WBC: 6.9 10*3/uL (ref 3.4–10.8)

## 2020-01-11 LAB — TSH: TSH: 2.44 u[IU]/mL (ref 0.450–4.500)

## 2020-01-11 LAB — HEMOGLOBIN A1C
Est. average glucose Bld gHb Est-mCnc: 88 mg/dL
Hgb A1c MFr Bld: 4.7 % — ABNORMAL LOW (ref 4.8–5.6)

## 2020-01-14 ENCOUNTER — Other Ambulatory Visit: Payer: Self-pay

## 2020-01-14 ENCOUNTER — Ambulatory Visit (INDEPENDENT_AMBULATORY_CARE_PROVIDER_SITE_OTHER): Payer: Medicare Other | Admitting: Physician Assistant

## 2020-01-14 ENCOUNTER — Encounter: Payer: Self-pay | Admitting: Physician Assistant

## 2020-01-14 VITALS — Ht 62.0 in | Wt 130.0 lb

## 2020-01-14 DIAGNOSIS — Z1211 Encounter for screening for malignant neoplasm of colon: Secondary | ICD-10-CM

## 2020-01-14 DIAGNOSIS — F10930 Alcohol use, unspecified with withdrawal, uncomplicated: Secondary | ICD-10-CM

## 2020-01-14 DIAGNOSIS — F10230 Alcohol dependence with withdrawal, uncomplicated: Secondary | ICD-10-CM | POA: Diagnosis not present

## 2020-01-14 DIAGNOSIS — Z0000 Encounter for general adult medical examination without abnormal findings: Secondary | ICD-10-CM

## 2020-01-14 MED ORDER — CHLORDIAZEPOXIDE HCL 25 MG PO CAPS: ORAL_CAPSULE | ORAL | 0 refills | Status: DC

## 2020-01-14 NOTE — Progress Notes (Signed)
Virtual Visit via Telephone Note:  I connected with Terri Ayala. [place] by telephone and verified that I am speaking with the correct person using two identifiers.    I discussed the limitations, risks, security and privacy concerns for performing an evaluation and management service by telephone and the availability of in person appointments. The staff discussed with patient that there may be a patient responsible charge related to this service. The patient expressed understanding and agreed to proceed.   Location of Patient- Home Location of Provider- Office     Subjective:   Terri Ayala is a 64 y.o. female who presents for Medicare Annual (Subsequent) preventive examination.  Review of Systems    General:   No F/C, wt loss Pulm:   No DIB, SOB, pleuritic chest pain Card:  No CP, palpitations Abd:  No v/d or pain, +nausea Ext:  No inc edema from baseline  Objective:    Today's Vitals   01/14/20 1309  Weight: 130 lb (59 kg)  Height: 5\' 2"  (1.575 m)   Body mass index is 23.78 kg/m.  Advanced Directives 03/17/2019 02/05/2019 05/05/2018 05/05/2018 05/04/2018 05/04/2018 02/22/2018  Does Patient Have a Medical Advance Directive? No No No No No No No  Would patient like information on creating a medical advance directive? No - Patient declined No - Patient declined - No - Patient declined No - Patient declined No - Patient declined No - Patient declined  Pre-existing out of facility DNR order (yellow form or pink MOST form) - - - - - - -  Some encounter information is confidential and restricted. Go to Review Flowsheets activity to see all data.    Current Medications (verified) Outpatient Encounter Medications as of 01/14/2020  Medication Sig  . alendronate (FOSAMAX) 70 MG tablet Take 70 mg by mouth every Monday. Take with a full glass of water on an empty stomach.   [name] Kitchen aspirin EC 81 MG tablet Take 81 mg by mouth daily.  . busPIRone (BUSPAR) 15 MG tablet Take 15 mg by mouth 3 (three) times  daily.  . DULoxetine (CYMBALTA) 60 MG capsule TAKE 1 CAPSULE (60 MG TOTAL) BY MOUTH DAILY. (Patient taking differently: Take 60 mg by mouth 2 (two) times daily. )  . folic acid (FOLVITE) 1 MG tablet Take 1 tablet (1 mg total) by mouth daily.  . Multiple Vitamin (MULTIVITAMIN WITH MINERALS) TABS tablet Take 1 tablet by mouth daily.  . norethindrone-ethinyl estradiol (FEMHRT 1/5) 1-5 MG-MCG TABS Take 1 tablet by mouth daily. For Osteoporosis  . pantoprazole (PROTONIX) 40 MG tablet Take 1 tablet (40 mg total) by mouth 2 (two) times daily.  . propranolol (INDERAL) 10 MG tablet TAKE 1 TABLET BY MOUTH 2 TIMES DAILY.  . SUMAtriptan (IMITREX) 100 MG tablet TAKE 1 TABLET BY MOUTH AT THE FIRST SIGN OF MIGRAINE, MAY REPEAT IN 2 HOURS. MAX 2 TABLETS IN 24 HOURS.**PATIENT NEEDS APT FOR FURTHER REFILLS**  . traZODone (DESYREL) 50 MG tablet Take 50 mg by mouth at bedtime as needed for sleep.   . chlordiazePOXIDE (LIBRIUM) 25 MG capsule 50mg  PO TID x 1D, then 25-50mg  PO BID X 1D, then 25-50mg  PO QD X 1D (Patient not taking: Reported on 01/14/2020)   No facility-administered encounter medications on file as of 01/14/2020.    Allergies (verified) Doxycycline, Codeine, and Tetracycline hcl   History: Past Medical History:  Diagnosis Date  . Alcohol withdrawal ([place]) 11/2017; 01/04/2018  . Alcoholism ([place])   . Anxiety   . Arthritis    "  hands" (01/04/2018)  . ASYMPTOMATIC POSTMENOPAUSAL STATUS 08/17/2009  . Chronic lower back pain   . Depression   . DYSPHAGIA 06/19/2007  . Fatty liver, alcoholic   . GERD (gastroesophageal reflux disease)   . GOITER, MULTINODULAR 08/17/2009  . Hyperlipidemia   . Migraines    "not sure what triggers them; I'll have 1 q couple weeks or more; come 2 days in a row when they come" (01/04/2018)  . Mitral valve prolapse   . OSTEOPOROSIS 08/17/2009  . PONV (postoperative nausea and vomiting)   . Supraventricular tachycardia [facility])    Past Surgical History:  Procedure Laterality  Date  . BREAST BIOPSY Right    "benign"  . CATARACT EXTRACTION W/ INTRAOCULAR LENS  IMPLANT, BILATERAL    . CERVICAL CONE BIOPSY  2000s  . CERVIX LESION DESTRUCTION  1991   "dysplasia; lesions"  . CHOLECYSTECTOMY N/A 03/14/2013   Procedure: LAPAROSCOPIC CHOLECYSTECTOMY WITH ATTEMPTED INTRAOPERATIVE CHOLANGIOGRAM;  Surgeon: Harl Bowie, MD;  Location: [place];  Service: General;  Laterality: N/A;  . laporoscopic abdominal surgery     "for endometriosis"  . LEFT HEART CATH AND CORONARY ANGIOGRAPHY N/A 05/07/2018   Procedure: LEFT HEART CATH AND CORONARY ANGIOGRAPHY;  Surgeon: Belva Crome, MD;  Location: [facility];  Service: Cardiovascular;  Laterality: N/A;   Family History  Problem Relation Age of Onset  . Colon cancer Father        Deceased, 72  . Osteoporosis Mother        Living, 41  . Healthy Brother   . Healthy Brother   . Healthy Son   . Healthy Son   . Thyroid disease Neg Hx   . Goiter Neg Hx    Social History   Socioeconomic History  . Marital status: Single    Spouse name: Not on file  . Number of children: Not on file  . Years of education: Not on file  . Highest education level: Not on file  Occupational History  . Occupation: Brewing technologist - retired    Fish farm manager: [employer]  Tobacco Use  . Smoking status: Former Smoker    Packs/day: 1.00    Years: 7.00    Pack years: 7.00    Types: Cigarettes    Quit date: 02/28/1974    Years since quitting: 45.9  . Smokeless tobacco: Never Used  . Tobacco comment: 01/04/2018 "smoked when I was a teenager"  Vaping Use  . Vaping Use: Never used  Substance and Sexual Activity  . Alcohol use: Yes    Alcohol/week: 28.0 standard drinks    Types: 28 Glasses of wine per week    Comment: 01/04/2018 "1 1/2 bottles of wine/day"  . Drug use: Never  . Sexual activity: Not Currently  Other Topics Concern  . Not on file  Social History Narrative   Lives alone.  She has a BS biology.   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   Social Determinants of Health   Financial Resource Strain:   . Difficulty of Paying Living Expenses: Not on file  Food Insecurity:   . Worried About Charity fundraiser in the Last Year: Not on file  . Ran Out of Food in the Last Year: Not on file  Transportation Needs:   . Lack of Transportation (Medical): Not on file  . Lack of Transportation (Non-Medical): Not on file  Physical Activity:   . Days of Exercise per Week: Not on  file  . Minutes of Exercise per Session: Not on file  Stress:   . Feeling of Stress : Not on file  Social Connections:   . Frequency of Communication with Friends and Family: Not on file  . Frequency of Social Gatherings with Friends and Family: Not on file  . Attends Religious Services: Not on file  . Active Member of Clubs or Organizations: Not on file  . Attends Archivist Meetings: Not on file  . Marital Status: Not on file    Tobacco Counseling Counseling given: Not Answered Comment: 01/04/2018 "smoked when I was a teenager"    Diabetic?No         Activities of Daily Living In your present state of health, do you have any difficulty performing the following activities: 01/14/2020 03/17/2019  Hearing? Y N  Vision? N N  Difficulty concentrating or making decisions? N Y  Walking or climbing stairs? N N  Dressing or bathing? N N  Doing errands, shopping? N N  Some recent data might be hidden    Patient Care Team: Lorrene Reid, PA-C as PCP - General (Physician Assistant) Burnell Blanks, MD as PCP - Cardiology (Cardiology) Bobbye Charleston, MD as Consulting Physician (Obstetrics and Gynecology) Juanita Craver, MD as Consulting Physician (Gastroenterology)  Indicate any recent Medical Services you may have received from other than [facility] providers in the past year (date may be approximate).     Assessment:   This is a routine wellness examination for Terri Ayala.  Hearing/Vision  screen No exam data present  Dietary issues and exercise activities discussed: -Encourage to continue with goal of alcohol cessation, reduce saturated and trans fats including fried foods, and stay as active as possible.  Goals   None    Depression Screen PHQ 2/9 Scores 01/14/2020 08/28/2019 04/04/2019 11/08/2018 06/26/2018  PHQ - 2 Score 0 0 0 1 0  PHQ- 9 Score 1 0 4 3 12     Fall Risk Fall Risk  01/14/2020 08/28/2019 06/26/2018  Falls in the past year? 0 0 1  Number falls in past yr: - - 1  Injury with Fall? - - 1  Risk for fall due to : - History of fall(s) History of fall(s);Impaired balance/gait  Follow up Falls evaluation completed Falls evaluation completed Falls evaluation completed    Any stairs in or around the home? Yes  If so, are there any without handrails? Yes  Home free of loose throw rugs in walkways, pet beds, electrical cords, etc? Yes  Adequate lighting in your home to reduce risk of falls? Yes   ASSISTIVE DEVICES UTILIZED TO PREVENT FALLS:  Life alert? Yes  Use of a cane, walker or w/c? No  Grab bars in the bathroom? Yes  Shower chair or bench in shower? Yes  Elevated toilet seat or a handicapped toilet? No   TIMED UP AND GO:  Was the test performed? No .  Length of time to ambulate 10 feet: 0 sec.   Telehealth Cognitive Function: wnl     6CIT Screen 01/14/2020  What Year? 0 points  What month? 0 points  What time? 0 points  Count back from 20 0 points  Months in reverse 0 points  Repeat phrase 0 points  Total Score 0    Immunizations Immunization History  Administered Date(s) Administered  . Influenza Split 11/22/2011, 12/05/2013, 12/18/2014  . Influenza,inj,Quad PF,6+ Mos 03/16/2013, 01/30/2016, 10/12/2017, 11/14/2018  . Influenza-Unspecified 12/06/2014, 12/30/2015  . Pneumococcal Conjugate-13 02/10/2016, 11/19/2018  . Pneumococcal  Polysaccharide-23 04/04/2014, 11/09/2017  . Td 12/18/2006  . Tdap 09/07/2016  . Zoster Recombinat  (Shingrix) 11/18/2016, 03/22/2017    TDAP status: Up to date Flu Vaccine status: Up to date Pneumococcal vaccine status: Up to date Covid-19 vaccine status: Completed vaccines  Qualifies for Shingles Vaccine? Yes   Zostavax completed Yes   Shingrix Completed?: Yes  Screening Tests Health Maintenance  Topic Date Due  . COVID-19 Vaccine (1) Never done  . PAP SMEAR-Modifier  02/11/2018  . MAMMOGRAM  02/28/2019  . INFLUENZA VACCINE  09/29/2019  . PNA vac Low Risk Adult (2 of 2 - PPSV23) 11/10/2022  . COLONOSCOPY  03/12/2025  . TETANUS/TDAP  09/08/2026  . DEXA SCAN  Completed  . Hepatitis C Screening  Completed  . HIV Screening  Completed    Health Maintenance  Health Maintenance Due  Topic Date Due  . COVID-19 Vaccine (1) Never done  . PAP SMEAR-Modifier  02/11/2018  . MAMMOGRAM  02/28/2019  . INFLUENZA VACCINE  09/29/2019    Colorectal cancer screening: Completed 03/13/2015. Repeat every 10 years Mammogram status: Ordered Patient to have done in December at [facility]. Pt provided with contact info and advised to call to schedule appt.  Bone Density status: Completed 04/17/2019. Results reflect: Bone density results: OSTEOPOROSIS. Repeat every 2 years.  Lung Cancer Screening: (Low Dose CT Chest recommended if Age 26-80 years, 30 pack-year currently smoking OR have quit w/in 15years.) does not qualify.   Lung Cancer Screening Referral:   Additional Screening:  Hepatitis C Screening: does qualify; Completed 02/06/19  Vision Screening: Recommended annual ophthalmology exams for early detection of glaucoma and other disorders of the eye. Is the patient up to date with their annual eye exam?  Yes  Who is the provider or what is the name of the office in which the patient attends annual eye exams?  If pt is not established with a provider, would they like to be referred to a provider to establish care? No .   Dental Screening: Recommended annual dental exams for proper oral  hygiene  Community Resource Referral / Chronic Care Management: CRR required this visit?  No   CCM required this visit?  No      Plan:  -Discussed most recent labs which most are essentially within normal limits or stable from prior with the exception of lipid panel and mild elevation of AST. Patient has relapsed and resume drinking alcohol again around 10/2019 when she became upset about finding out one of her good friends had passed away last year and no one informed her, but has recently stopped drinking. Is requesting medication to help with withdrawal symptoms of tremors, sweats and nausea. States does not feel like needs to go to the hospital. Tolerated chlordiazepoxide without issues. Still attending AA. Also reports poor diet. -Will refill chlordiazepoxide and recommend to seek immediate medical care/call 911 if starts experiencing worsening withdrawal symptoms.  -Patient will be due for repeat colonoscopy 02/2020 but currently has no one that can accompany her and inquiring about possibly doing Cologuard. Discussed with patient Cologuard is usually not indicated when there is family history of colon cancer in first degree relative, however, given situation and previous colonoscopy has been normal will send order for Cologuard. When appropriate, recommend to resume colonoscopy or if Cologuard results positive suggest proceeding with colonoscopy. Patient verbalized understanding and agreeable. -Follow up in 1-2 weeks for alcohol cessation   I have personally reviewed and noted the following in the patient's chart:   .  Medical and social history . Use of alcohol, tobacco or illicit drugs  . Current medications and supplements . Functional ability and status . Nutritional status . Physical activity . Advanced directives . List of other physicians . Hospitalizations, surgeries, and ER visits in previous 12 months . Vitals . Screenings to include cognitive, depression, and  falls . Referrals and appointments  In addition, I have reviewed and discussed with patient certain preventive protocols, quality metrics, and best practice recommendations. A written personalized care plan for preventive services as well as general preventive health recommendations were provided to patient.

## 2020-01-14 NOTE — Patient Instructions (Addendum)
  Terri Ayala , Thank you for taking time for your Medicare Wellness Visit. I appreciate your ongoing commitment to your health goals. Please review the following plan we discussed and let me know if I can assist you in the future.   These are the goals we discussed: Goals   None     This is a list of the screening recommended for you and due dates:  Health Maintenance  Topic Date Due  . COVID-19 Vaccine (1) Never done  . Pap Smear  02/11/2018  . Mammogram  02/28/2019  . Flu Shot  09/29/2019  . Pneumonia vaccines (2 of 2 - PPSV23) 11/10/2022  . Colon Cancer Screening  03/12/2025  . Tetanus Vaccine  09/08/2026  . DEXA scan (bone density measurement)  Completed  .  Hepatitis C: One time screening is recommended by Center for Disease Control  (CDC) for  adults born from [birth year] through 1966.   Completed  . HIV Screening  Completed

## 2020-01-27 ENCOUNTER — Other Ambulatory Visit: Payer: Self-pay | Admitting: Physician Assistant

## 2020-02-12 ENCOUNTER — Other Ambulatory Visit: Payer: Self-pay | Admitting: Physician Assistant

## 2020-02-12 DIAGNOSIS — G43009 Migraine without aura, not intractable, without status migrainosus: Secondary | ICD-10-CM

## 2020-03-25 ENCOUNTER — Telehealth: Payer: Self-pay | Admitting: Physician Assistant

## 2020-03-25 ENCOUNTER — Other Ambulatory Visit: Payer: Self-pay

## 2020-03-25 ENCOUNTER — Ambulatory Visit (INDEPENDENT_AMBULATORY_CARE_PROVIDER_SITE_OTHER): Payer: Medicare Other | Admitting: Physician Assistant

## 2020-03-25 ENCOUNTER — Encounter: Payer: Self-pay | Admitting: Physician Assistant

## 2020-03-25 VITALS — Ht 62.0 in | Wt 130.0 lb

## 2020-03-25 DIAGNOSIS — K219 Gastro-esophageal reflux disease without esophagitis: Secondary | ICD-10-CM | POA: Insufficient documentation

## 2020-03-25 DIAGNOSIS — R194 Change in bowel habit: Secondary | ICD-10-CM | POA: Insufficient documentation

## 2020-03-25 DIAGNOSIS — Z87898 Personal history of other specified conditions: Secondary | ICD-10-CM | POA: Diagnosis not present

## 2020-03-25 DIAGNOSIS — K625 Hemorrhage of anus and rectum: Secondary | ICD-10-CM | POA: Insufficient documentation

## 2020-03-25 DIAGNOSIS — F10230 Alcohol dependence with withdrawal, uncomplicated: Secondary | ICD-10-CM | POA: Diagnosis not present

## 2020-03-25 DIAGNOSIS — Z860100 Personal history of colon polyps, unspecified: Secondary | ICD-10-CM

## 2020-03-25 DIAGNOSIS — R0602 Shortness of breath: Secondary | ICD-10-CM | POA: Diagnosis not present

## 2020-03-25 DIAGNOSIS — R079 Chest pain, unspecified: Secondary | ICD-10-CM

## 2020-03-25 DIAGNOSIS — K5900 Constipation, unspecified: Secondary | ICD-10-CM | POA: Insufficient documentation

## 2020-03-25 DIAGNOSIS — K3184 Gastroparesis: Secondary | ICD-10-CM | POA: Insufficient documentation

## 2020-03-25 DIAGNOSIS — K200 Eosinophilic esophagitis: Secondary | ICD-10-CM | POA: Insufficient documentation

## 2020-03-25 DIAGNOSIS — Z800 Family history of malignant neoplasm of digestive organs: Secondary | ICD-10-CM | POA: Insufficient documentation

## 2020-03-25 DIAGNOSIS — J069 Acute upper respiratory infection, unspecified: Secondary | ICD-10-CM

## 2020-03-25 DIAGNOSIS — F10930 Alcohol use, unspecified with withdrawal, uncomplicated: Secondary | ICD-10-CM

## 2020-03-25 DIAGNOSIS — R141 Gas pain: Secondary | ICD-10-CM | POA: Insufficient documentation

## 2020-03-25 HISTORY — DX: Personal history of colonic polyps: Z86.010

## 2020-03-25 HISTORY — DX: Personal history of colon polyps, unspecified: Z86.0100

## 2020-03-25 MED ORDER — AMOXICILLIN-POT CLAVULANATE 875-125 MG PO TABS: 1.0000 | ORAL_TABLET | Freq: Two times a day (BID) | ORAL | 0 refills | Status: DC

## 2020-03-25 MED ORDER — CHLORDIAZEPOXIDE HCL 25 MG PO CAPS: ORAL_CAPSULE | ORAL | 0 refills | Status: DC

## 2020-03-25 MED ORDER — ALBUTEROL SULFATE HFA 108 (90 BASE) MCG/ACT IN AERS: 2.0000 | INHALATION_SPRAY | Freq: Four times a day (QID) | RESPIRATORY_TRACT | 0 refills | Status: DC | PRN

## 2020-03-25 NOTE — Progress Notes (Signed)
Telehealth office visit note for Terri Reid, PA-C- at Primary Care at [facility]   I connected with current patient today by telephone and verified that I am speaking with the correct person   . Location of the patient: Home . Location of the provider: Office - This visit type was conducted due to national recommendations for restrictions regarding the COVID-19 Pandemic (e.g. social distancing) in an effort to limit this patient's exposure and mitigate transmission in our community.    - No physical exam could be performed with this format, beyond that communicated to [place] by the patient/ family members as noted.   - Additionally my office staff/ schedulers were to discuss with the patient that there may be a monetary charge related to this service, depending on their medical insurance.  My understanding is that patient understood and consented to proceed.     _________________________________________________________________________________   History of Present Illness: Patient calls in with c/o nasal congestion, ear fullness, dry cough usually in the mornings and has to constantly be clearing her throat.  Symptoms started about 2 weeks ago and did go for Covid testing last Thursday (6 days ago) and received her results this morning which is negative.  Denies fever, chills or sick contact exposures. Patient is also going through alcohol withdrawals- has symptoms of tremors. Denies confusion, seizures, or palpitations. States she quit drinking about 1.5 week ago. Continues with AA sessions. Is considering counseling. Also complains of shortness of breath mostly in the mornings and intermittent chest pain that lasts for few seconds for a few days. Denies abdominal/jaw/arm pain. Has nausea and vomiting x 1-2 days at the beginning of her sickness which have resolved.      No flowsheet data found.  Depression screen [facility] 2/9 03/25/2020 01/14/2020 08/28/2019 04/04/2019 11/08/2018  Decreased  Interest 0 0 0 0 0  Down, Depressed, Hopeless 0 0 0 0 1  PHQ - 2 Score 0 0 0 0 1  Altered sleeping 1 1 0 1 1  Tired, decreased energy 0 0 0 1 1  Change in appetite 0 0 0 1 0  Feeling bad or failure about yourself  0 0 0 0 0  Trouble concentrating 0 0 0 1 0  Moving slowly or fidgety/restless 0 0 0 0 0  Suicidal thoughts 0 0 0 0 0  PHQ-9 Score 1 1 0 4 3  Difficult doing work/chores Not difficult at all Not difficult at all - Somewhat difficult Somewhat difficult  Some recent data might be hidden      Impression and Recommendations:     1. Upper respiratory tract infection, unspecified type   2. Shortness of breath   3. Alcohol withdrawal syndrome without complication ([place])   4. History of alcohol use disorder   5. Chest pain, unspecified type     Chest pain, unspecified type: -Discussed with patient coming to the office for nurse visit to obtain EKG for further evaluation.  Patient is agreeable. Symptoms could possibly be related to alcohol withdrawal. -Discussed red flag signs and symptoms to monitor for and recommend to seek immediate medical care. -Will also obtain vitals and labs (CMP, CBC w/ diff, TSH, troponin).  Shortness of breath: -Patient has a history of pneumonia and with upper respiratory symptoms recommend obtaining imaging studies with CXR for further evaluation. Patient is agreeable.  -Will send rx for albuterol inhaler to use as needed for shortness of breath. Monitor for worsening symptoms and recommend to  seek immediate medical care.  Alcohol withdrawal syndrome without complication, History of alcohol use disorder: -Discussed with patient considering rehabilitation programs and prefers to pursue with counseling. Is agreeable to referral.  -Will provide refill of chlordiazepoxide 25 mg for withdrawal symptoms. Monitor for worsening symptoms including altered mental status. -Continue with AA.   Upper respiratory tract infection, unspecified type: -Patient  teste negative for Covid-19 and symptoms have been ongoing for >10 days so will start antibiotic therapy with Augmentin x 7 days. -Continue home supportive care and monitor symptoms. -If symptoms fail to improve or worsen recommend in-person evaluation.    - As part of my medical decision making, I reviewed the following data within the [place] History obtained from pt /family, CMA notes reviewed and incorporated if applicable, Labs reviewed, Radiograph/ tests reviewed if applicable and OV notes from prior OV's with me, as well as any other specialists she/he has seen since seeing me last, were all reviewed and used in my medical decision making process today.    - Additionally, when appropriate, discussion had with patient regarding our treatment plan, and their biases/concerns about that plan were used in my medical decision making today.    - The patient agreed with the plan and demonstrated an understanding of the instructions.   No barriers to understanding were identified.     - The patient was advised to call back or seek an in-person evaluation if the symptoms worsen or if the condition fails to improve as anticipated.   Return if symptoms worsen or fail to improve.    Orders Placed This Encounter  Procedures  . DG Chest 2 View  . Ambulatory referral to Psychology    Meds ordered this encounter  Medications  . amoxicillin-clavulanate (AUGMENTIN) 875-125 MG tablet    Sig: Take 1 tablet by mouth 2 (two) times daily.    Dispense:  14 tablet    Refill:  0    Order Specific Question:   Supervising Provider    Answer:   Beatrice Lecher D [[id number]]  . albuterol (VENTOLIN HFA) 108 (90 Base) MCG/ACT inhaler    Sig: Inhale 2 puffs into the lungs every 6 (six) hours as needed for wheezing or shortness of breath.    Dispense:  8 g    Refill:  0    Order Specific Question:   Supervising Provider    Answer:   Beatrice Lecher D [[id number]]  . chlordiazePOXIDE  (LIBRIUM) 25 MG capsule    Sig: 50mg  PO TID x 1D, then 25-50mg  PO BID X 1D, then 25-50mg  PO QD X 1D    Dispense:  10 capsule    Refill:  0    Order Specific Question:   Supervising Provider    Answer:   Beatrice Lecher D [[id number]]    Medications Discontinued During This Encounter  Medication Reason  . chlordiazePOXIDE (LIBRIUM) 25 MG capsule Reorder       Time spent on visit including pre-visit chart review and post-visit care was 25 minutes.      The [place] was signed into law in 2016 which includes the topic of electronic health records.  This provides immediate access to information in MyChart.  This includes consultation notes, operative notes, office notes, lab results and pathology reports.  If you have any questions about what you read please let us know at your next visit or call us at the office.  We are right here with you.   __________________________________________________________________________________  Patient Care Team    Relationship Specialty Notifications Start End  Terri Ayala, [place] PCP - General Physician Assistant All results, Admissions 07/08/19   Burnell Blanks, MD PCP - Cardiology Cardiology  05/08/18   Bobbye Charleston, MD Consulting Physician Obstetrics and Gynecology  07/10/18   Juanita Craver, MD Consulting Physician Gastroenterology  07/10/18      -Vitals obtained; medications/ allergies reconciled;  personal medical, social, Sx etc.histories were updated by CMA, reviewed by me and are reflected in chart   Patient Active Problem List   Diagnosis Date Noted  . History of colonic polyps 03/25/2020  . Hemorrhage of rectum and anus 03/25/2020  . Gastroparesis 03/25/2020  . Gastroesophageal reflux disease 03/25/2020  . Flatulence, eructation and gas pain 03/25/2020  . Family history of malignant neoplasm of gastrointestinal tract 03/25/2020  . Eosinophilic esophagitis 81/82/9937  . Constipation 03/25/2020  .  Change in bowel habit 03/25/2020  . Hospital discharge follow-up 04/04/2019  . Metabolic acidosis 16/96/7893  . Abnormal liver function 02/06/2019  . Alcohol withdrawal delirium ([place]) 02/06/2019  . Elevated LFTs 07/12/2018  . Healthcare maintenance 06/26/2018  . Rib fracture 01/04/2018  . Chest pain 12/08/2017  . Leukocytosis 12/08/2017  . Essential hypertension 12/08/2017  . Alcohol withdrawal ([place]) 07/21/2017  . Lower urinary tract infectious disease 07/21/2017  . Osteopenia 08/27/2016  . Alcohol dependence with withdrawal ([place]) 01/27/2016  . Generalized anxiety disorder 01/27/2016  . Severe episode of recurrent major depressive disorder, without psychotic features ([place]) 01/27/2016  . Major depressive disorder, recurrent episode, moderate ([place])   . Alcohol use disorder, severe, dependence ([place]) 04/03/2014  . Lumbosacral radiculopathy at L5 05/20/2013  . Nonspecific elevation of levels of transaminase or lactic acid dehydrogenase (LDH) 03/16/2013  . Hepatic steatosis 03/16/2013  . Nausea and vomiting 03/16/2013  . Chronic cholecystitis 03/14/2013  . Cervical arthritis 10/12/2012  . Palpitations 11/29/2010  . SVT (supraventricular tachycardia) ([place]) 11/29/2010  . Mitral valve prolapse 11/29/2010  . Decreased libido 11/15/2010  . GOITER, MULTINODULAR 08/17/2009  . ANXIETY 08/17/2009  . Migraine headache 08/17/2009  . Hearing loss 08/17/2009  . Osteoporosis 08/17/2009  . ASYMPTOMATIC POSTMENOPAUSAL STATUS 08/17/2009  . DYSPHAGIA 06/19/2007     Current Meds  Medication Sig  . albuterol (VENTOLIN HFA) 108 (90 Base) MCG/ACT inhaler Inhale 2 puffs into the lungs every 6 (six) hours as needed for wheezing or shortness of breath.  [name] Kitchen alendronate (FOSAMAX) 70 MG tablet Take 70 mg by mouth every Monday. Take with a full glass of water on an empty stomach.  [name] Kitchen amoxicillin-clavulanate (AUGMENTIN) 875-125 MG tablet Take 1 tablet by mouth 2 (two) times daily.  [name] Kitchen aspirin EC 81 MG tablet  Take 81 mg by mouth daily.  . busPIRone (BUSPAR) 15 MG tablet Take 15 mg by mouth 3 (three) times daily.  . DULoxetine (CYMBALTA) 60 MG capsule TAKE 1 CAPSULE (60 MG TOTAL) BY MOUTH DAILY. (Patient taking differently: Take 60 mg by mouth 2 (two) times daily.)  . Multiple Vitamin (MULTIVITAMIN WITH MINERALS) TABS tablet Take 1 tablet by mouth daily.  . norethindrone-ethinyl estradiol (FEMHRT 1/5) 1-5 MG-MCG TABS Take 1 tablet by mouth daily. For Osteoporosis  . pantoprazole (PROTONIX) 40 MG tablet Take 1 tablet (40 mg total) by mouth 2 (two) times daily.  . propranolol (INDERAL) 10 MG tablet TAKE 1 TABLET BY MOUTH 2 TIMES DAILY.  . SUMAtriptan (IMITREX) 100 MG tablet TAKE 1 TABLET BY MOUTH AT THE FIRST SIGN OF MIGRAINE, MAY REPEAT IN 2 HOURS. MAX 2  TABLETS IN 24 HOURS  . traZODone (DESYREL) 50 MG tablet Take 50 mg by mouth at bedtime as needed for sleep.      Allergies:  Allergies  Allergen Reactions  . Doxycycline Swelling    Mouth swelling and sores  . Codeine Itching  . Tetracycline Hcl Swelling    Mouth swelling and sores     ROS:  See above HPI for pertinent positives and negatives   Objective:   Height 5\' 2"  (1.575 m), weight 130 lb (59 kg).  (if some vitals are omitted, this means that patient was UNABLE to obtain them.) General: A & O * 3; sounds in no acute distress Respiratory: speaking in full sentences, no conversational dyspnea Psych: insight appears good, mood- appears full

## 2020-03-25 NOTE — Telephone Encounter (Signed)
Called patient to see if she can be here by 3 pm (03-25-20). She states she is tired and having trouble breathing and asked if she could come tomorrow if not today. I transferred her to speak with [facility]. (Nurse)

## 2020-03-25 NOTE — Telephone Encounter (Signed)
Spoke with patient who states she was having some chest pressure and SOB and feeling very tired and could not make it to our office. I advised patient I was concerned with the symptoms she was having a requested her to allow me to call 911 and have EMS come to her house. Patient was agreeable. [facility] sent to patient home. AS, CMA

## 2020-03-26 ENCOUNTER — Ambulatory Visit: Payer: Medicare Other | Admitting: Physician Assistant

## 2020-03-27 ENCOUNTER — Other Ambulatory Visit: Payer: Federal, State, Local not specified - PPO

## 2020-03-27 ENCOUNTER — Ambulatory Visit
Admission: RE | Admit: 2020-03-27 | Discharge: 2020-03-27 | Disposition: A | Discharge status: Home / Self Care | Payer: Federal, State, Local not specified - PPO | Source: Ambulatory Visit | Attending: Physician Assistant | Admitting: Physician Assistant

## 2020-03-27 ENCOUNTER — Other Ambulatory Visit: Payer: Self-pay

## 2020-03-27 DIAGNOSIS — R079 Chest pain, unspecified: Secondary | ICD-10-CM

## 2020-03-27 DIAGNOSIS — R0602 Shortness of breath: Secondary | ICD-10-CM

## 2020-03-27 DIAGNOSIS — F10930 Alcohol use, unspecified with withdrawal, uncomplicated: Secondary | ICD-10-CM

## 2020-03-27 DIAGNOSIS — F10230 Alcohol dependence with withdrawal, uncomplicated: Secondary | ICD-10-CM

## 2020-03-27 DIAGNOSIS — R309 Painful micturition, unspecified: Secondary | ICD-10-CM

## 2020-03-27 NOTE — Addendum Note (Signed)
Addended by: Mickel Crow on: 03/27/2020 09:04 AM   Modules accepted: Orders

## 2020-03-29 LAB — CBC WITH DIFFERENTIAL/PLATELET
Basophils Absolute: 0.1 10*3/uL (ref 0.0–0.2)
Basos: 2 %
EOS (ABSOLUTE): 0.1 10*3/uL (ref 0.0–0.4)
Eos: 1 %
Hematocrit: 37.7 % (ref 34.0–46.6)
Hemoglobin: 12.7 g/dL (ref 11.1–15.9)
Immature Grans (Abs): 0 10*3/uL (ref 0.0–0.1)
Immature Granulocytes: 1 %
Lymphocytes Absolute: 1.3 10*3/uL (ref 0.7–3.1)
Lymphs: 21 %
MCH: 31.9 pg (ref 26.6–33.0)
MCHC: 33.7 g/dL (ref 31.5–35.7)
MCV: 95 fL (ref 79–97)
Monocytes Absolute: 0.7 10*3/uL (ref 0.1–0.9)
Monocytes: 12 %
Neutrophils Absolute: 3.9 10*3/uL (ref 1.4–7.0)
Neutrophils: 63 %
Platelets: 202 10*3/uL (ref 150–450)
RBC: 3.98 x10E6/uL (ref 3.77–5.28)
RDW: 14.4 % (ref 11.7–15.4)
WBC: 6.1 10*3/uL (ref 3.4–10.8)

## 2020-03-29 LAB — COMPREHENSIVE METABOLIC PANEL WITH GFR
ALT: 8 [IU]/L (ref 0–32)
AST: 24 [IU]/L (ref 0–40)
Albumin/Globulin Ratio: 1.8 (ref 1.2–2.2)
Albumin: 4.7 g/dL (ref 3.8–4.8)
Alkaline Phosphatase: 87 [IU]/L (ref 44–121)
BUN/Creatinine Ratio: 14 (ref 12–28)
BUN: 12 mg/dL (ref 8–27)
Bilirubin Total: 0.4 mg/dL (ref 0.0–1.2)
CO2: 21 mmol/L (ref 20–29)
Calcium: 9.3 mg/dL (ref 8.7–10.3)
Chloride: 100 mmol/L (ref 96–106)
Creatinine, Ser: 0.83 mg/dL (ref 0.57–1.00)
GFR calc Af Amer: 86 mL/min/{1.73_m2}
GFR calc non Af Amer: 74 mL/min/{1.73_m2}
Globulin, Total: 2.6 g/dL (ref 1.5–4.5)
Glucose: 85 mg/dL (ref 65–99)
Potassium: 4.9 mmol/L (ref 3.5–5.2)
Sodium: 139 mmol/L (ref 134–144)
Total Protein: 7.3 g/dL (ref 6.0–8.5)

## 2020-03-29 LAB — TSH: TSH: 1.81 u[IU]/mL (ref 0.450–4.500)

## 2020-03-29 LAB — URINE CULTURE: Organism ID, Bacteria: NO GROWTH

## 2020-03-29 LAB — TROPONIN T: Troponin T (Highly Sensitive): 18 ng/L (ref 0–14)

## 2020-04-06 ENCOUNTER — Telehealth: Payer: Self-pay | Admitting: Physician Assistant

## 2020-04-06 NOTE — Telephone Encounter (Signed)
Please call and offer patient next available apt to discuss concerns with provider. AS, CMA

## 2020-04-15 ENCOUNTER — Ambulatory Visit (INDEPENDENT_AMBULATORY_CARE_PROVIDER_SITE_OTHER): Payer: Medicare Other | Admitting: Physician Assistant

## 2020-04-15 ENCOUNTER — Encounter: Payer: Self-pay | Admitting: Physician Assistant

## 2020-04-15 VITALS — Ht 62.0 in | Wt 130.0 lb

## 2020-04-15 DIAGNOSIS — R2230 Localized swelling, mass and lump, unspecified upper limb: Secondary | ICD-10-CM

## 2020-04-15 DIAGNOSIS — M79672 Pain in left foot: Secondary | ICD-10-CM

## 2020-04-15 MED ORDER — PREDNISONE 20 MG PO TABS: ORAL_TABLET | ORAL | 0 refills | Status: DC

## 2020-04-15 NOTE — Progress Notes (Signed)
Telehealth office visit note for Terri Reid, PA-C- at Primary Care at [facility]   I connected with current patient today by telephone and verified that I am speaking with the correct person   . Location of the patient: Home . Location of the provider: Office - This visit type was conducted due to national recommendations for restrictions regarding the COVID-19 Pandemic (e.g. social distancing) in an effort to limit this patient's exposure and mitigate transmission in our community.    - No physical exam could be performed with this format, beyond that communicated to [place] by the patient/ family members as noted.   - Additionally my office staff/ schedulers were to discuss with the patient that there may be a monetary charge related to this service, depending on their medical insurance.  My understanding is that patient understood and consented to proceed.     _________________________________________________________________________________   History of Present Illness: Patient calls in with complaints of left foot pain x about 10 days. States woke up with throbbing foot pain, swelling and warmth. The foot is most painful at the top. Denies injury/trauma, erythema or fever. Has been applying lidocaine and taking Ibuprofen with minimal relief. Also has complaints of painful growth on her finger which is firm. Reports history of osteoarthritis. States years ago was diagnosed with tendonitis of right foot, but this pain feels different.      No flowsheet data found.  Depression screen [facility] 2/9 04/15/2020 03/25/2020 01/14/2020 08/28/2019 04/04/2019  Decreased Interest 0 0 0 0 0  Down, Depressed, Hopeless 0 0 0 0 0  PHQ - 2 Score 0 0 0 0 0  Altered sleeping 0 1 1 0 1  Tired, decreased energy 1 0 0 0 1  Change in appetite 0 0 0 0 1  Feeling bad or failure about yourself  0 0 0 0 0  Trouble concentrating 0 0 0 0 1  Moving slowly or fidgety/restless 0 0 0 0 0  Suicidal thoughts 0 0 0 0  0  PHQ-9 Score 1 1 1  0 4  Difficult doing work/chores - Not difficult at all Not difficult at all - Somewhat difficult  Some recent data might be hidden      Impression and Recommendations:     1. Left foot pain   2. Nodule of finger, unspecified laterality     Left foot pain: -Discussed with patient various etiologies and possibly inflammatory vs infectious. Will start corticosteroid therapy. -Continue with elevation and recommend to apply ice. -If symptoms fail to improve or worsen recommend in-person evaluation. -Monitor for new symptoms such as fever, severe foot pain and erythema.   Nodule of finger: -Will place referral to Orthopedics for further evaluation.   - As part of my medical decision making, I reviewed the following data within the [place] History obtained from pt /family, CMA notes reviewed and incorporated if applicable, Labs reviewed, Radiograph/ tests reviewed if applicable and OV notes from prior OV's with me, as well as any other specialists she/he has seen since seeing me last, were all reviewed and used in my medical decision making process today.    - Additionally, when appropriate, discussion had with patient regarding our treatment plan, and their biases/concerns about that plan were used in my medical decision making today.    - The patient agreed with the plan and demonstrated an understanding of the instructions.   No barriers to understanding were identified.     -  The patient was advised to call back or seek an in-person evaluation if the symptoms worsen or if the condition fails to improve as anticipated.   Return if symptoms worsen or fail to improve.    No orders of the defined types were placed in this encounter.   Meds ordered this encounter  Medications  . predniSONE (DELTASONE) 20 MG tablet    Sig: Take 3 tablets by mouth once daily x 2 days, then 2 tablets x 2 days, then 1 tablet x 2 days, then 0.5 tablet x 2 days     Dispense:  15 tablet    Refill:  0    Order Specific Question:   Supervising Provider    Answer:   Beatrice Lecher D [[id number]]    There are no discontinued medications.     Time spent on telephone encounter was 10 minutes.      The [place] was signed into law in 2016 which includes the topic of electronic health records.  This provides immediate access to information in MyChart.  This includes consultation notes, operative notes, office notes, lab results and pathology reports.  If you have any questions about what you read please let us know at your next visit or call us at the office.  We are right here with you.   __________________________________________________________________________________     Patient Care Team    Relationship Specialty Notifications Start End  Terri Ayala, [place] PCP - General Physician Assistant All results, Admissions 07/08/19   Burnell Blanks, MD PCP - Cardiology Cardiology  05/08/18   Bobbye Charleston, MD Consulting Physician Obstetrics and Gynecology  07/10/18   Juanita Craver, MD Consulting Physician Gastroenterology  07/10/18      -Vitals obtained; medications/ allergies reconciled;  personal medical, social, Sx etc.histories were updated by CMA, reviewed by me and are reflected in chart   Patient Active Problem List   Diagnosis Date Noted  . History of colonic polyps 03/25/2020  . Hemorrhage of rectum and anus 03/25/2020  . Gastroparesis 03/25/2020  . Gastroesophageal reflux disease 03/25/2020  . Flatulence, eructation and gas pain 03/25/2020  . Family history of malignant neoplasm of gastrointestinal tract 03/25/2020  . Eosinophilic esophagitis 71/24/5809  . Constipation 03/25/2020  . Change in bowel habit 03/25/2020  . Hospital discharge follow-up 04/04/2019  . Metabolic acidosis 98/33/8250  . Abnormal liver function 02/06/2019  . Alcohol withdrawal delirium ([place]) 02/06/2019  . Elevated LFTs 07/12/2018  .  Healthcare maintenance 06/26/2018  . Rib fracture 01/04/2018  . Chest pain 12/08/2017  . Leukocytosis 12/08/2017  . Essential hypertension 12/08/2017  . Alcohol withdrawal ([place]) 07/21/2017  . Lower urinary tract infectious disease 07/21/2017  . Osteopenia 08/27/2016  . Alcohol dependence with withdrawal ([place]) 01/27/2016  . Generalized anxiety disorder 01/27/2016  . Severe episode of recurrent major depressive disorder, without psychotic features ([place]) 01/27/2016  . Major depressive disorder, recurrent episode, moderate ([place])   . Alcohol use disorder, severe, dependence ([place]) 04/03/2014  . Lumbosacral radiculopathy at L5 05/20/2013  . Nonspecific elevation of levels of transaminase or lactic acid dehydrogenase (LDH) 03/16/2013  . Hepatic steatosis 03/16/2013  . Nausea and vomiting 03/16/2013  . Chronic cholecystitis 03/14/2013  . Cervical arthritis 10/12/2012  . Palpitations 11/29/2010  . SVT (supraventricular tachycardia) ([place]) 11/29/2010  . Mitral valve prolapse 11/29/2010  . Decreased libido 11/15/2010  . GOITER, MULTINODULAR 08/17/2009  . ANXIETY 08/17/2009  . Migraine headache 08/17/2009  . Hearing loss 08/17/2009  . Osteoporosis 08/17/2009  .  ASYMPTOMATIC POSTMENOPAUSAL STATUS 08/17/2009  . DYSPHAGIA 06/19/2007     Current Meds  Medication Sig  . albuterol (VENTOLIN HFA) 108 (90 Base) MCG/ACT inhaler Inhale 2 puffs into the lungs every 6 (six) hours as needed for wheezing or shortness of breath.  [name] Kitchen alendronate (FOSAMAX) 70 MG tablet Take 70 mg by mouth every Monday. Take with a full glass of water on an empty stomach.  [name] Kitchen aspirin EC 81 MG tablet Take 81 mg by mouth daily.  . busPIRone (BUSPAR) 15 MG tablet Take 15 mg by mouth 3 (three) times daily.  . DULoxetine (CYMBALTA) 60 MG capsule TAKE 1 CAPSULE (60 MG TOTAL) BY MOUTH DAILY. (Patient taking differently: Take 60 mg by mouth 2 (two) times daily.)  . Multiple Vitamin (MULTIVITAMIN WITH MINERALS) TABS tablet Take 1  tablet by mouth daily.  . norethindrone-ethinyl estradiol (FEMHRT 1/5) 1-5 MG-MCG TABS Take 1 tablet by mouth daily. For Osteoporosis  . pantoprazole (PROTONIX) 40 MG tablet Take 1 tablet (40 mg total) by mouth 2 (two) times daily.  . predniSONE (DELTASONE) 20 MG tablet Take 3 tablets by mouth once daily x 2 days, then 2 tablets x 2 days, then 1 tablet x 2 days, then 0.5 tablet x 2 days  . propranolol (INDERAL) 10 MG tablet TAKE 1 TABLET BY MOUTH 2 TIMES DAILY.  . SUMAtriptan (IMITREX) 100 MG tablet TAKE 1 TABLET BY MOUTH AT THE FIRST SIGN OF MIGRAINE, MAY REPEAT IN 2 HOURS. MAX 2 TABLETS IN 24 HOURS  . traZODone (DESYREL) 50 MG tablet Take 50 mg by mouth at bedtime as needed for sleep.      Allergies:  Allergies  Allergen Reactions  . Doxycycline Swelling    Mouth swelling and sores  . Codeine Itching  . Tetracycline Hcl Swelling    Mouth swelling and sores     ROS:  See above HPI for pertinent positives and negatives   Objective:   Height 5\' 2"  (1.575 m), weight 130 lb (59 kg).  (if some vitals are omitted, this means that patient was UNABLE to obtain them. ) General: A & O * 3; sounds in no acute distress Respiratory: speaking in full sentences, no conversational dyspnea Psych: insight appears good, mood- appears full

## 2020-04-15 NOTE — Patient Instructions (Signed)
Foot Pain Many things can cause foot pain. Some common causes are:  An injury.  A sprain.  Arthritis.  Blisters.  Bunions. Follow these instructions at home: Managing pain, stiffness, and swelling If directed, put ice on the painful area:  Put ice in a plastic bag.  Place a towel between your skin and the bag.  Leave the ice on for 20 minutes, 2-3 times a day.   Activity  Do not stand or walk for long periods.  Return to your normal activities as told by your health care provider. Ask your health care provider what activities are safe for you.  Do stretches to relieve foot pain and stiffness as told by your health care provider.  Do not lift anything that is heavier than 10 lb (4.5 kg), or the limit that you are told, until your health care provider says that it is safe. Lifting a lot of weight can put added pressure on your feet. Lifestyle  Wear comfortable, supportive shoes that fit you well. Do not wear high heels.  Keep your feet clean and dry. General instructions  Take over-the-counter and prescription medicines only as told by your health care provider.  Rub your foot gently.  Pay attention to any changes in your symptoms.  Keep all follow-up visits as told by your health care provider. This is important. Contact a health care provider if:  Your pain does not get better after a few days of self-care.  Your pain gets worse.  You cannot stand on your foot. Get help right away if:  Your foot is numb or tingling.  Your foot or toes are swollen.  Your foot or toes turn white or blue.  You have warmth and redness along your foot. Summary  Common causes of foot pain are injury, sprain, arthritis, blisters or bunions.  Ice, medicines, and comfortable shoes may help foot pain.  Contact your health care provider if your pain does not get better after a few days of self-care. This information is not intended to replace advice given to you by your health  care provider. Make sure you discuss any questions you have with your health care provider. Document Revised: 11/30/2017 Document Reviewed: 11/30/2017 Elsevier Patient Education  2021 Elsevier Inc.  

## 2020-04-21 ENCOUNTER — Other Ambulatory Visit: Payer: Self-pay | Admitting: Physician Assistant

## 2020-04-27 ENCOUNTER — Other Ambulatory Visit: Payer: Self-pay | Admitting: Physician Assistant

## 2020-04-27 DIAGNOSIS — R0602 Shortness of breath: Secondary | ICD-10-CM

## 2020-04-30 ENCOUNTER — Telehealth: Payer: Self-pay | Admitting: Physician Assistant

## 2020-04-30 NOTE — Telephone Encounter (Signed)
Patient LVM and advised that steroid for foot pain has worn off, she is traveling soon and wants to know what next actions PCP can take. Also, she has not heard anything from the ortho referral. Please call. Thank you

## 2020-04-30 NOTE — Telephone Encounter (Signed)
Please see whats going on with the ortho referral. AS, CMA

## 2020-05-04 ENCOUNTER — Other Ambulatory Visit: Payer: Self-pay | Admitting: Physician Assistant

## 2020-05-04 DIAGNOSIS — R2230 Localized swelling, mass and lump, unspecified upper limb: Secondary | ICD-10-CM

## 2020-05-07 ENCOUNTER — Ambulatory Visit (INDEPENDENT_AMBULATORY_CARE_PROVIDER_SITE_OTHER): Payer: Medicare Other | Admitting: Podiatry

## 2020-05-07 ENCOUNTER — Ambulatory Visit (INDEPENDENT_AMBULATORY_CARE_PROVIDER_SITE_OTHER): Payer: Medicare Other

## 2020-05-07 ENCOUNTER — Other Ambulatory Visit: Payer: Self-pay

## 2020-05-07 ENCOUNTER — Other Ambulatory Visit: Payer: Self-pay | Admitting: Podiatry

## 2020-05-07 DIAGNOSIS — M778 Other enthesopathies, not elsewhere classified: Secondary | ICD-10-CM

## 2020-05-07 DIAGNOSIS — S92302A Fracture of unspecified metatarsal bone(s), left foot, initial encounter for closed fracture: Secondary | ICD-10-CM

## 2020-05-11 NOTE — Progress Notes (Signed)
She presents today chief complaint of pain to her left foot.  States is been going on now for the past 3 to 4 weeks she states that I really been unable to bear any pressure on it I do not know what happened to it I woke up 1 morning and it was just like this.  She denies any trauma.  Objective: Vital signs are stable alert oriented x3.  She has no erythema but moderate edema to the left foot.  Contracture of the digits.  Pulses are palpable neurologic sensorium is intact  Radiographs taken today demonstrate a complete transverse fracture of the second and third metatarsals at the metatarsal necks.  The second metatarsal is dorsally dislocated and the head itself is plantarflexed.  There is some bone callus development.  There is also a fracture at the base of the fourth metatarsal minimally displaced noncomminuted.  She also has a fracture at the fifth toe the base of the proximal phalanx nondisplaced noncomminuted.  Assessment: Multiple fractures second third fourth metatarsals.  Displacement and irregularity of alignment.  Fracture fifth digit.  Plan: Discussed etiology pathology conservative surgical therapies discussed with her in great detail today that if this was not surgically corrected it could develop into nonunions and pseudoarthroses and become painful for the rest of her life she also understands that without surgical correction this is something that can cause biomechanical issues that could be problematic for the remainder of her life as well.  However at this time she wishes not to undergo surgery should like to wear the cam walker and allow this to try to heal on its own.  I expressed my concerns she understands this and is amenable to it.  I will follow-up with her in a few weeks for another set of x-rays.

## 2020-05-18 ENCOUNTER — Telehealth: Payer: Self-pay

## 2020-05-18 NOTE — Telephone Encounter (Signed)
Pt is having a hard time with foot, even with the boot/ cast on it. Pt lives by herself and its hard to perform daily task.

## 2020-05-18 NOTE — Telephone Encounter (Signed)
Left message for patient advising she re-visit with Dr. Milinda Pointer regarding surgery for the multiple fractures, as she is just going to continue to have pain over time and in the future. Requested she call me back and let me know how she would like to proceed.

## 2020-05-20 ENCOUNTER — Encounter: Payer: Self-pay | Admitting: Orthopaedic Surgery

## 2020-05-20 ENCOUNTER — Ambulatory Visit: Payer: Medicare Other | Admitting: Orthopaedic Surgery

## 2020-05-20 ENCOUNTER — Other Ambulatory Visit: Payer: Self-pay

## 2020-05-20 ENCOUNTER — Ambulatory Visit (INDEPENDENT_AMBULATORY_CARE_PROVIDER_SITE_OTHER): Payer: Medicare Other | Admitting: Orthopaedic Surgery

## 2020-05-20 ENCOUNTER — Ambulatory Visit (INDEPENDENT_AMBULATORY_CARE_PROVIDER_SITE_OTHER): Payer: Medicare Other

## 2020-05-20 DIAGNOSIS — M67449 Ganglion, unspecified hand: Secondary | ICD-10-CM

## 2020-05-20 DIAGNOSIS — M25512 Pain in left shoulder: Secondary | ICD-10-CM

## 2020-05-20 DIAGNOSIS — M67441 Ganglion, right hand: Secondary | ICD-10-CM | POA: Diagnosis not present

## 2020-05-20 DIAGNOSIS — G8929 Other chronic pain: Secondary | ICD-10-CM

## 2020-05-20 MED ORDER — METHYLPREDNISOLONE ACETATE 40 MG/ML IJ SUSP
40.0000 mg | INTRAMUSCULAR | Status: AC | PRN
  Administered 2020-05-20: 40 mg via INTRA_ARTICULAR

## 2020-05-20 MED ORDER — LIDOCAINE HCL 2 % IJ SOLN
2.0000 mL | INTRAMUSCULAR | Status: AC | PRN
  Administered 2020-05-20: 2 mL

## 2020-05-20 MED ORDER — BUPIVACAINE HCL 0.25 % IJ SOLN
2.0000 mL | INTRAMUSCULAR | Status: AC | PRN
  Administered 2020-05-20: 2 mL via INTRA_ARTICULAR

## 2020-05-20 NOTE — Progress Notes (Signed)
Office Visit Note   Patient: Terri Ayala           Date of Birth: 1955-07-22           MRN: [id number] Visit Date: 05/20/2020              Requested by: Lorrene Reid, PA-C [place],   [id number] PCP: Lorrene Reid, PA-C   Assessment & Plan: Visit Diagnoses:  1. Chronic left shoulder pain   2. Digital mucous cyst of finger     Plan: Impression is left shoulder subacromial bursitis and right long finger digital mucoid cyst which seems to be resolving.  In regards to the left shoulder, we have discussed subacromial cortisone injection today for which she would like to proceed.  In regards to the finger, her pain has dramatically improved as her cyst has decreased in size.  She will follow up with [place] as needed for that.  Follow-Up Instructions: Return if symptoms worsen or fail to improve.   Orders:  Orders Placed This Encounter  Procedures  . Large Joint Inj: L subacromial bursa  . XR Shoulder Left  . XR Finger Middle Right   No orders of the defined types were placed in this encounter.     Procedures: Large Joint Inj: L subacromial bursa on 05/20/2020 10:57 AM Indications: pain Details: 22 G needle Medications: 2 mL lidocaine 2 %; 2 mL bupivacaine 0.25 %; 40 mg methylPREDNISolone acetate 40 MG/ML Outcome: tolerated well, no immediate complications Patient was prepped and draped in the usual sterile fashion.       Clinical Data: No additional findings.   Subjective: Chief Complaint  Patient presents with  . Right Middle Finger - Pain  . Left Shoulder - Pain    HPI patient is a very pleasant 65 year old female who comes in today with left shoulder pain as well as concerns about her right long finger.  In regards to her left shoulder, this began bothering her after falling onto an ottoman a few weeks ago.  She has had pain to the top of her shoulder since.  She also notes occasional catching.  Pain is worse when trying to dress  herself such as when she internally rotates her shoulder.  She has not been taking any medication for this.  In regards to her right long finger, she noticed pain and swelling to the dorsum of the DIP joint a while back.  This did seem to improve with time.  Review of Systems as detailed in HPI.  All others reviewed and are negative.   Objective: Vital Signs: There were no vitals taken for this visit.  Physical Exam well-developed well-nourished female no acute distress.  Alert and oriented x3.  Ortho Exam examination of the left shoulder reveals approximately 50% range of motion with internal rotation to her back pocket.  Positive empty can test.  Negative cross body adduction.  No tenderness to the [facility] joint.  3 out of 5 strength throughout.  Right long finger exam shows mild tenderness to the DIP joint.  Very small nodule that is minimally tender.  No skin changes.  Full range of motion.  She is neurovascular intact distally.  Specialty Comments:  No specialty comments available.  Imaging: XR Finger Middle Right  Result Date: 05/20/2020 X-rays demonstrated dorsal osteophyte to the DIP joint of the right long finger  XR Shoulder Left  Result Date: 05/20/2020 Moderate degenerative changes the [facility] joint.  No acute  fracture noted    PMFS History: Patient Active Problem List   Diagnosis Date Noted  . History of colonic polyps 03/25/2020  . Hemorrhage of rectum and anus 03/25/2020  . Gastroparesis 03/25/2020  . Gastroesophageal reflux disease 03/25/2020  . Flatulence, eructation and gas pain 03/25/2020  . Family history of malignant neoplasm of gastrointestinal tract 03/25/2020  . Eosinophilic esophagitis 38/75/6433  . Constipation 03/25/2020  . Change in bowel habit 03/25/2020  . Hospital discharge follow-up 04/04/2019  . Metabolic acidosis 29/51/8841  . Abnormal liver function 02/06/2019  . Alcohol withdrawal delirium ([place]) 02/06/2019  . Elevated LFTs 07/12/2018  . Healthcare  maintenance 06/26/2018  . Rib fracture 01/04/2018  . Chest pain 12/08/2017  . Leukocytosis 12/08/2017  . Essential hypertension 12/08/2017  . Alcohol withdrawal ([place]) 07/21/2017  . Lower urinary tract infectious disease 07/21/2017  . Osteopenia 08/27/2016  . Alcohol dependence with withdrawal ([place]) 01/27/2016  . Generalized anxiety disorder 01/27/2016  . Severe episode of recurrent major depressive disorder, without psychotic features ([place]) 01/27/2016  . Major depressive disorder, recurrent episode, moderate ([place])   . Alcohol use disorder, severe, dependence ([place]) 04/03/2014  . Lumbosacral radiculopathy at L5 05/20/2013  . Nonspecific elevation of levels of transaminase or lactic acid dehydrogenase (LDH) 03/16/2013  . Hepatic steatosis 03/16/2013  . Nausea and vomiting 03/16/2013  . Chronic cholecystitis 03/14/2013  . Cervical arthritis 10/12/2012  . Palpitations 11/29/2010  . SVT (supraventricular tachycardia) ([place]) 11/29/2010  . Mitral valve prolapse 11/29/2010  . Decreased libido 11/15/2010  . GOITER, MULTINODULAR 08/17/2009  . ANXIETY 08/17/2009  . Migraine headache 08/17/2009  . Hearing loss 08/17/2009  . Osteoporosis 08/17/2009  . ASYMPTOMATIC POSTMENOPAUSAL STATUS 08/17/2009  . DYSPHAGIA 06/19/2007   Past Medical History:  Diagnosis Date  . Alcohol withdrawal ([place]) 11/2017; 01/04/2018  . Alcoholism ([place])   . Anxiety   . Arthritis    "hands" (01/04/2018)  . ASYMPTOMATIC POSTMENOPAUSAL STATUS 08/17/2009  . Chronic lower back pain   . Depression   . DYSPHAGIA 06/19/2007  . Fatty liver, alcoholic   . GERD (gastroesophageal reflux disease)   . GOITER, MULTINODULAR 08/17/2009  . Hyperlipidemia   . Migraines    "not sure what triggers them; I'll have 1 q couple weeks or more; come 2 days in a row when they come" (01/04/2018)  . Mitral valve prolapse   . OSTEOPOROSIS 08/17/2009  . PONV (postoperative nausea and vomiting)   . Supraventricular tachycardia ([facility])     Family  History  Problem Relation Age of Onset  . Colon cancer Father        Deceased, 8  . Osteoporosis Mother        Living, 73  . Healthy Brother   . Healthy Brother   . Healthy Son   . Healthy Son   . Thyroid disease Neg Hx   . Goiter Neg Hx     Past Surgical History:  Procedure Laterality Date  . BREAST BIOPSY Right    "benign"  . CATARACT EXTRACTION W/ INTRAOCULAR LENS  IMPLANT, BILATERAL    . CERVICAL CONE BIOPSY  2000s  . CERVIX LESION DESTRUCTION  1991   "dysplasia; lesions"  . CHOLECYSTECTOMY N/A 03/14/2013   Procedure: LAPAROSCOPIC CHOLECYSTECTOMY WITH ATTEMPTED INTRAOPERATIVE CHOLANGIOGRAM;  Surgeon: Harl Bowie, MD;  Location: [facility];  Service: General;  Laterality: N/A;  . laporoscopic abdominal surgery     "for endometriosis"  . LEFT HEART CATH AND CORONARY ANGIOGRAPHY N/A 05/07/2018   Procedure: LEFT HEART CATH AND CORONARY  ANGIOGRAPHY;  Surgeon: Belva Crome, MD;  Location: [place] [facility];  Service: Cardiovascular;  Laterality: N/A;   Social History   Occupational History  . Occupation: Brewing technologist - retired    Fish farm manager: [place] [employer]  Tobacco Use  . Smoking status: Former Smoker    Packs/day: 1.00    Years: 7.00    Pack years: 7.00    Types: Cigarettes    Quit date: 02/28/1974    Years since quitting: 46.2  . Smokeless tobacco: Never Used  . Tobacco comment: 01/04/2018 "smoked when I was a teenager"  Vaping Use  . Vaping Use: Never used  Substance and Sexual Activity  . Alcohol use: Yes    Alcohol/week: 28.0 standard drinks    Types: 28 Glasses of wine per week    Comment: 01/04/2018 "1 1/2 bottles of wine/day"  . Drug use: Never  . Sexual activity: Not Currently

## 2020-05-21 ENCOUNTER — Ambulatory Visit: Payer: Medicare Other | Admitting: Orthopaedic Surgery

## 2020-05-21 ENCOUNTER — Telehealth: Payer: Self-pay

## 2020-05-21 NOTE — Telephone Encounter (Signed)
Patient called she had a appointment scheduled for this morning patient stated she is unable to make it to this appointment due to being in pain all night, pain was from her shoulder and her foot I did offer patient a appointment for next Wednesday she stated her original doctor will be back in the office so she will stick to seeing original provider call back:[phone]

## 2020-05-21 NOTE — Telephone Encounter (Signed)
Ok, thanks.

## 2020-05-22 ENCOUNTER — Other Ambulatory Visit: Payer: Self-pay | Admitting: Physician Assistant

## 2020-05-22 DIAGNOSIS — G43009 Migraine without aura, not intractable, without status migrainosus: Secondary | ICD-10-CM

## 2020-05-26 ENCOUNTER — Ambulatory Visit (INDEPENDENT_AMBULATORY_CARE_PROVIDER_SITE_OTHER): Payer: Medicare Other

## 2020-05-26 ENCOUNTER — Encounter: Payer: Self-pay | Admitting: Podiatry

## 2020-05-26 ENCOUNTER — Ambulatory Visit (INDEPENDENT_AMBULATORY_CARE_PROVIDER_SITE_OTHER): Payer: Medicare Other | Admitting: Podiatry

## 2020-05-26 ENCOUNTER — Other Ambulatory Visit: Payer: Self-pay

## 2020-05-26 DIAGNOSIS — M7751 Other enthesopathy of right foot: Secondary | ICD-10-CM

## 2020-05-26 DIAGNOSIS — M778 Other enthesopathies, not elsewhere classified: Secondary | ICD-10-CM

## 2020-05-26 DIAGNOSIS — S92302D Fracture of unspecified metatarsal bone(s), left foot, subsequent encounter for fracture with routine healing: Secondary | ICD-10-CM | POA: Diagnosis not present

## 2020-05-26 MED ORDER — MELOXICAM 15 MG PO TABS: 15.0000 mg | ORAL_TABLET | Freq: Every day | ORAL | 3 refills | Status: DC

## 2020-05-26 MED ORDER — TRIAMCINOLONE ACETONIDE 40 MG/ML IJ SUSP
20.0000 mg | Freq: Once | INTRAMUSCULAR | Status: AC
  Administered 2020-05-26: 20 mg

## 2020-05-26 NOTE — Progress Notes (Signed)
She presents today for follow-up of fractures to multiple metatarsals of the left foot.  States that the anterior ankles been hurting her also.  Objective: Vital signs are stable she is alert oriented x3 pulses are palpable.  Much decrease in edema to the left foot no reproducible pain on palpation of the right foot other than inversion against resistance of the subtalar joint and on palpation of the sinus tarsi.  Her radiographs taken today demonstrate healing fractures much to my surprise the alignment is not horrible and I do think that these were going to heal uneventfully but the malalignment may bother her down the road.  The M ankle does not demonstrate any problems.  Assessment: Subtalar joint capsulitis pes planus right foot.  Healing fractures left foot.  Plan: At this point I went ahead and injected the sinus tarsi subtalar joint of the right foot and ankle she tolerated procedure well 20 mg Kenalog 5 mg Marcaine point maximal tenderness.  Order follow-up with her in about 1 more month.  Have a set of x-rays left will be taken at that time.

## 2020-05-27 ENCOUNTER — Other Ambulatory Visit: Payer: Self-pay | Admitting: Podiatry

## 2020-05-27 DIAGNOSIS — M7751 Other enthesopathy of right foot: Secondary | ICD-10-CM

## 2020-06-05 LAB — COLOGUARD: Cologuard: NEGATIVE

## 2020-06-15 ENCOUNTER — Encounter: Payer: Self-pay | Admitting: Physician Assistant

## 2020-06-23 ENCOUNTER — Ambulatory Visit: Payer: Federal, State, Local not specified - PPO | Admitting: Podiatry

## 2020-09-14 ENCOUNTER — Other Ambulatory Visit: Payer: Self-pay | Admitting: Physician Assistant

## 2020-09-14 NOTE — Telephone Encounter (Signed)
Can you please call patient to schedule a follow up appointment for medication management?

## 2020-09-26 ENCOUNTER — Inpatient Hospital Stay (HOSPITAL_COMMUNITY): Payer: Medicare Other

## 2020-09-26 ENCOUNTER — Emergency Department (HOSPITAL_COMMUNITY): Payer: Medicare Other

## 2020-09-26 ENCOUNTER — Inpatient Hospital Stay (HOSPITAL_COMMUNITY)
Admission: EM | Admit: 2020-09-26 | Discharge: 2020-09-30 | DRG: 442 | Disposition: A | Discharge status: Home Health | Payer: Medicare Other | Attending: Family Medicine | Admitting: Family Medicine

## 2020-09-26 DIAGNOSIS — F419 Anxiety disorder, unspecified: Secondary | ICD-10-CM | POA: Diagnosis present

## 2020-09-26 DIAGNOSIS — R197 Diarrhea, unspecified: Secondary | ICD-10-CM | POA: Diagnosis present

## 2020-09-26 DIAGNOSIS — K760 Fatty (change of) liver, not elsewhere classified: Secondary | ICD-10-CM | POA: Diagnosis not present

## 2020-09-26 DIAGNOSIS — E8729 Other acidosis: Secondary | ICD-10-CM

## 2020-09-26 DIAGNOSIS — Z87891 Personal history of nicotine dependence: Secondary | ICD-10-CM | POA: Diagnosis not present

## 2020-09-26 DIAGNOSIS — R0609 Other forms of dyspnea: Secondary | ICD-10-CM | POA: Diagnosis not present

## 2020-09-26 DIAGNOSIS — R6510 Systemic inflammatory response syndrome (SIRS) of non-infectious origin without acute organ dysfunction: Secondary | ICD-10-CM | POA: Diagnosis present

## 2020-09-26 DIAGNOSIS — Z79899 Other long term (current) drug therapy: Secondary | ICD-10-CM

## 2020-09-26 DIAGNOSIS — G8929 Other chronic pain: Secondary | ICD-10-CM | POA: Diagnosis present

## 2020-09-26 DIAGNOSIS — D638 Anemia in other chronic diseases classified elsewhere: Secondary | ICD-10-CM | POA: Diagnosis present

## 2020-09-26 DIAGNOSIS — E876 Hypokalemia: Secondary | ICD-10-CM | POA: Diagnosis present

## 2020-09-26 DIAGNOSIS — E872 Acidosis, unspecified: Secondary | ICD-10-CM

## 2020-09-26 DIAGNOSIS — E785 Hyperlipidemia, unspecified: Secondary | ICD-10-CM | POA: Diagnosis present

## 2020-09-26 DIAGNOSIS — I10 Essential (primary) hypertension: Secondary | ICD-10-CM | POA: Diagnosis present

## 2020-09-26 DIAGNOSIS — R1310 Dysphagia, unspecified: Secondary | ICD-10-CM | POA: Diagnosis present

## 2020-09-26 DIAGNOSIS — F32A Depression, unspecified: Secondary | ICD-10-CM | POA: Diagnosis present

## 2020-09-26 DIAGNOSIS — Z7982 Long term (current) use of aspirin: Secondary | ICD-10-CM | POA: Diagnosis not present

## 2020-09-26 DIAGNOSIS — Z20822 Contact with and (suspected) exposure to covid-19: Secondary | ICD-10-CM | POA: Diagnosis present

## 2020-09-26 DIAGNOSIS — R000 Tachycardia, unspecified: Secondary | ICD-10-CM | POA: Diagnosis not present

## 2020-09-26 DIAGNOSIS — D6959 Other secondary thrombocytopenia: Secondary | ICD-10-CM | POA: Diagnosis present

## 2020-09-26 DIAGNOSIS — Z800 Family history of malignant neoplasm of digestive organs: Secondary | ICD-10-CM | POA: Diagnosis not present

## 2020-09-26 DIAGNOSIS — R0600 Dyspnea, unspecified: Secondary | ICD-10-CM

## 2020-09-26 DIAGNOSIS — F10239 Alcohol dependence with withdrawal, unspecified: Secondary | ICD-10-CM | POA: Diagnosis present

## 2020-09-26 DIAGNOSIS — Z7983 Long term (current) use of bisphosphonates: Secondary | ICD-10-CM | POA: Diagnosis not present

## 2020-09-26 DIAGNOSIS — F10231 Alcohol dependence with withdrawal delirium: Secondary | ICD-10-CM | POA: Diagnosis present

## 2020-09-26 DIAGNOSIS — F10229 Alcohol dependence with intoxication, unspecified: Secondary | ICD-10-CM | POA: Diagnosis present

## 2020-09-26 DIAGNOSIS — K700 Alcoholic fatty liver: Principal | ICD-10-CM | POA: Diagnosis present

## 2020-09-26 DIAGNOSIS — K219 Gastro-esophageal reflux disease without esophagitis: Secondary | ICD-10-CM | POA: Diagnosis present

## 2020-09-26 DIAGNOSIS — F10931 Alcohol use, unspecified with withdrawal delirium: Secondary | ICD-10-CM | POA: Diagnosis present

## 2020-09-26 DIAGNOSIS — E860 Dehydration: Secondary | ICD-10-CM | POA: Diagnosis present

## 2020-09-26 DIAGNOSIS — R1013 Epigastric pain: Secondary | ICD-10-CM | POA: Diagnosis not present

## 2020-09-26 DIAGNOSIS — F10939 Alcohol use, unspecified with withdrawal, unspecified: Secondary | ICD-10-CM

## 2020-09-26 LAB — CREATININE, SERUM
Creatinine, Ser: 0.65 mg/dL (ref 0.44–1.00)
GFR, Estimated: >60 mL/min (ref >60)

## 2020-09-26 LAB — I-STAT VENOUS BLOOD GAS, ED
Acid-base deficit: 2 mmol/L (ref 0.0–2.0)
Bicarbonate: 21.9 mmol/L (ref 20.0–28.0)
Calcium, Ion: 1.03 mmol/L — ABNORMAL LOW (ref 1.15–1.40)
HCT: 41 % (ref 36.0–46.0)
Hemoglobin: 13.9 g/dL (ref 12.0–15.0)
O2 Saturation: 98 %
Potassium: 3.8 mmol/L (ref 3.5–5.1)
Sodium: 137 mmol/L (ref 135–145)
TCO2: 23 mmol/L (ref 22–32)
pCO2, Ven: 32.9 mmHg — ABNORMAL LOW (ref 44.0–60.0)
pH, Ven: 7.431 — ABNORMAL HIGH (ref 7.250–7.430)
pO2, Ven: 101 mmHg — ABNORMAL HIGH (ref 32.0–45.0)

## 2020-09-26 LAB — CBC WITH DIFFERENTIAL/PLATELET
Abs Immature Granulocytes: 0.08 K/uL — ABNORMAL HIGH (ref 0.00–0.07)
Basophils Absolute: 0.1 K/uL (ref 0.0–0.1)
Basophils Relative: 1 %
Eosinophils Absolute: 0 K/uL (ref 0.0–0.5)
Eosinophils Relative: 0 %
HCT: 41.3 % (ref 36.0–46.0)
Hemoglobin: 14 g/dL (ref 12.0–15.0)
Immature Granulocytes: 1 %
Lymphocytes Relative: 3 %
Lymphs Abs: 0.5 K/uL — ABNORMAL LOW (ref 0.7–4.0)
MCH: 33.3 pg (ref 26.0–34.0)
MCHC: 33.9 g/dL (ref 30.0–36.0)
MCV: 98.3 fL (ref 80.0–100.0)
Monocytes Absolute: 0.8 K/uL (ref 0.1–1.0)
Monocytes Relative: 5 %
Neutro Abs: 14.4 K/uL — ABNORMAL HIGH (ref 1.7–7.7)
Neutrophils Relative %: 90 %
Platelets: 163 K/uL (ref 150–400)
RBC: 4.2 MIL/uL (ref 3.87–5.11)
RDW: 13.7 % (ref 11.5–15.5)
WBC: 15.9 K/uL — ABNORMAL HIGH (ref 4.0–10.5)
nRBC: 0 % (ref 0.0–0.2)

## 2020-09-26 LAB — TROPONIN I (HIGH SENSITIVITY)
Troponin I (High Sensitivity): 7 ng/L (ref <18)
Troponin I (High Sensitivity): 13 ng/L

## 2020-09-26 LAB — LACTIC ACID, PLASMA
Lactic Acid, Venous: 10.5 mmol/L (ref 0.5–1.9)
Lactic Acid, Venous: 5.5 mmol/L (ref 0.5–1.9)
Lactic Acid, Venous: 1.3 mmol/L (ref 0.5–1.9)
Lactic Acid, Venous: 1 mmol/L (ref 0.5–1.9)

## 2020-09-26 LAB — COMPREHENSIVE METABOLIC PANEL
ALT: 29 U/L (ref 0–44)
AST: 157 U/L — ABNORMAL HIGH (ref 15–41)
Albumin: 4.7 g/dL (ref 3.5–5.0)
Alkaline Phosphatase: 83 U/L (ref 38–126)
Anion gap: 27 — ABNORMAL HIGH (ref 5–15)
BUN: 18 mg/dL (ref 8–23)
CO2: 13 mmol/L — ABNORMAL LOW (ref 22–32)
Calcium: 9.1 mg/dL (ref 8.9–10.3)
Chloride: 96 mmol/L — ABNORMAL LOW (ref 98–111)
Creatinine, Ser: 0.82 mg/dL (ref 0.44–1.00)
GFR, Estimated: >60 mL/min (ref >60)
Glucose, Bld: 109 mg/dL — ABNORMAL HIGH (ref 70–99)
Potassium: 4.6 mmol/L (ref 3.5–5.1)
Sodium: 136 mmol/L (ref 135–145)
Total Bilirubin: 1.3 mg/dL — ABNORMAL HIGH (ref 0.3–1.2)
Total Protein: 7.9 g/dL (ref 6.5–8.1)

## 2020-09-26 LAB — RAPID URINE DRUG SCREEN, HOSP PERFORMED
Amphetamines: NOT DETECTED
Barbiturates: NOT DETECTED
Benzodiazepines: NOT DETECTED
Cocaine: NOT DETECTED
Opiates: NOT DETECTED
Tetrahydrocannabinol: NOT DETECTED

## 2020-09-26 LAB — URINALYSIS, ROUTINE W REFLEX MICROSCOPIC
Bacteria, UA: NONE SEEN
Bilirubin Urine: NEGATIVE
Glucose, UA: NEGATIVE mg/dL
Ketones, ur: 20 mg/dL — AB
Nitrite: NEGATIVE
Protein, ur: 100 mg/dL — AB
Specific Gravity, Urine: 1.039 — ABNORMAL HIGH (ref 1.005–1.030)
pH: 5 (ref 5.0–8.0)

## 2020-09-26 LAB — CBC
HCT: 33.5 % — ABNORMAL LOW (ref 36.0–46.0)
Hemoglobin: 11.6 g/dL — ABNORMAL LOW (ref 12.0–15.0)
MCH: 33.7 pg (ref 26.0–34.0)
MCHC: 34.6 g/dL (ref 30.0–36.0)
MCV: 97.4 fL (ref 80.0–100.0)
Platelets: 128 10*3/uL — ABNORMAL LOW (ref 150–400)
RBC: 3.44 MIL/uL — ABNORMAL LOW (ref 3.87–5.11)
RDW: 13.7 % (ref 11.5–15.5)
WBC: 11.7 10*3/uL — ABNORMAL HIGH (ref 4.0–10.5)
nRBC: 0 % (ref 0.0–0.2)

## 2020-09-26 LAB — ETHANOL: Alcohol, Ethyl (B): 107 mg/dL — ABNORMAL HIGH (ref <10)

## 2020-09-26 LAB — RESP PANEL BY RT-PCR (FLU A&B, COVID) ARPGX2
Influenza A by PCR: NEGATIVE
Influenza B by PCR: NEGATIVE
SARS Coronavirus 2 by RT PCR: NEGATIVE

## 2020-09-26 LAB — AMMONIA: Ammonia: 16 umol/L (ref 9–35)

## 2020-09-26 LAB — PROTIME-INR
INR: 1 (ref 0.8–1.2)
Prothrombin Time: 12.8 seconds (ref 11.4–15.2)

## 2020-09-26 LAB — D-DIMER, QUANTITATIVE: D-Dimer, Quant: 0.87 ug{FEU}/mL — ABNORMAL HIGH (ref 0.00–0.50)

## 2020-09-26 LAB — BETA-HYDROXYBUTYRIC ACID: Beta-Hydroxybutyric Acid: 3.51 mmol/L — ABNORMAL HIGH (ref 0.05–0.27)

## 2020-09-26 LAB — HIV ANTIBODY (ROUTINE TESTING W REFLEX): HIV Screen 4th Generation wRfx: NONREACTIVE

## 2020-09-26 LAB — MRSA NEXT GEN BY PCR, NASAL: MRSA by PCR Next Gen: NOT DETECTED

## 2020-09-26 LAB — LIPASE, BLOOD: Lipase: 34 U/L (ref 11–51)

## 2020-09-26 MED ORDER — VANCOMYCIN HCL IN DEXTROSE 1-5 GM/200ML-% IV SOLN: 1000.0000 mg | INTRAVENOUS | Status: DC

## 2020-09-26 MED ORDER — SODIUM CHLORIDE 0.9 % IV BOLUS
1000.0000 mL | Freq: Once | INTRAVENOUS | Status: AC
  Administered 2020-09-26: 1000 mL via INTRAVENOUS

## 2020-09-26 MED ORDER — LORAZEPAM 2 MG/ML IJ SOLN
2.0000 mg | Freq: Once | INTRAMUSCULAR | Status: AC
  Administered 2020-09-26: 2 mg via INTRAVENOUS
  Filled 2020-09-26: qty 1

## 2020-09-26 MED ORDER — VANCOMYCIN HCL 1250 MG/250ML IV SOLN
1250.0000 mg | Freq: Once | INTRAVENOUS | Status: AC
  Administered 2020-09-26: 1250 mg via INTRAVENOUS
  Filled 2020-09-26: qty 250

## 2020-09-26 MED ORDER — IOHEXOL 300 MG/ML  SOLN
100.0000 mL | Freq: Once | INTRAMUSCULAR | Status: AC | PRN
  Administered 2020-09-26: 100 mL via INTRAVENOUS

## 2020-09-26 MED ORDER — METRONIDAZOLE 500 MG/100ML IV SOLN
500.0000 mg | Freq: Three times a day (TID) | INTRAVENOUS | Status: DC
  Administered 2020-09-27 (×2): 500 mg via INTRAVENOUS
  Filled 2020-09-26 (×2): qty 100

## 2020-09-26 MED ORDER — KCL IN DEXTROSE-NACL 20-5-0.45 MEQ/L-%-% IV SOLN
INTRAVENOUS | Status: DC
Start: 2020-09-26 — End: 2020-09-28
  Filled 2020-09-26 (×3): qty 1000

## 2020-09-26 MED ORDER — SODIUM CHLORIDE 0.9 % IV SOLN
2.0000 g | Freq: Two times a day (BID) | INTRAVENOUS | Status: DC
  Administered 2020-09-27: 2 g via INTRAVENOUS
  Filled 2020-09-26: qty 2

## 2020-09-26 MED ORDER — FOLIC ACID 1 MG PO TABS
1.0000 mg | ORAL_TABLET | Freq: Every day | ORAL | Status: DC
Start: 2020-09-26 — End: 2020-09-28
  Administered 2020-09-26 – 2020-09-27 (×2): 1 mg via ORAL
  Filled 2020-09-26 (×2): qty 1

## 2020-09-26 MED ORDER — FOLIC ACID 1 MG PO TABS: 1.0000 mg | ORAL_TABLET | Freq: Every day | ORAL | Status: DC

## 2020-09-26 MED ORDER — METRONIDAZOLE 500 MG/100ML IV SOLN
500.0000 mg | Freq: Once | INTRAVENOUS | Status: AC
  Administered 2020-09-26: 500 mg via INTRAVENOUS
  Filled 2020-09-26: qty 100

## 2020-09-26 MED ORDER — ALBUTEROL SULFATE HFA 108 (90 BASE) MCG/ACT IN AERS
2.0000 | INHALATION_SPRAY | Freq: Four times a day (QID) | RESPIRATORY_TRACT | Status: DC | PRN
  Filled 2020-09-26: qty 6.7

## 2020-09-26 MED ORDER — LORAZEPAM 2 MG/ML IJ SOLN
1.0000 mg | INTRAMUSCULAR | Status: DC | PRN
  Administered 2020-09-26 – 2020-09-27 (×3): 2 mg via INTRAVENOUS
  Filled 2020-09-26 (×3): qty 1

## 2020-09-26 MED ORDER — DULOXETINE HCL 60 MG PO CPEP
60.0000 mg | ORAL_CAPSULE | Freq: Every day | ORAL | Status: DC
  Administered 2020-09-26 – 2020-09-30 (×5): 60 mg via ORAL
  Filled 2020-09-26 (×5): qty 1

## 2020-09-26 MED ORDER — LACTATED RINGERS IV BOLUS
1000.0000 mL | Freq: Once | INTRAVENOUS | Status: AC
  Administered 2020-09-26: 1000 mL via INTRAVENOUS

## 2020-09-26 MED ORDER — THIAMINE HCL 100 MG PO TABS
100.0000 mg | ORAL_TABLET | Freq: Every day | ORAL | Status: DC
  Administered 2020-09-26 – 2020-09-27 (×2): 100 mg via ORAL
  Filled 2020-09-26 (×2): qty 1

## 2020-09-26 MED ORDER — HEPARIN SODIUM (PORCINE) 5000 UNIT/ML IJ SOLN
5000.0000 [IU] | Freq: Three times a day (TID) | INTRAMUSCULAR | Status: DC
  Administered 2020-09-26 – 2020-09-30 (×13): 5000 [IU] via SUBCUTANEOUS
  Filled 2020-09-26 (×13): qty 1

## 2020-09-26 MED ORDER — TRAZODONE HCL 50 MG PO TABS
50.0000 mg | ORAL_TABLET | Freq: Every evening | ORAL | Status: DC | PRN
  Administered 2020-09-28 – 2020-09-29 (×2): 50 mg via ORAL
  Filled 2020-09-26 (×2): qty 1

## 2020-09-26 MED ORDER — POLYETHYLENE GLYCOL 3350 17 G PO PACK: 17.0000 g | PACK | Freq: Every day | ORAL | Status: DC | PRN

## 2020-09-26 MED ORDER — PANTOPRAZOLE SODIUM 40 MG IV SOLR
40.0000 mg | Freq: Every day | INTRAVENOUS | Status: DC
  Administered 2020-09-26 – 2020-09-29 (×4): 40 mg via INTRAVENOUS
  Filled 2020-09-26 (×4): qty 40

## 2020-09-26 MED ORDER — SODIUM CHLORIDE 0.9 % IV SOLN
2.0000 g | Freq: Once | INTRAVENOUS | Status: AC
  Administered 2020-09-26: 2 g via INTRAVENOUS
  Filled 2020-09-26: qty 2

## 2020-09-26 MED ORDER — CHLORHEXIDINE GLUCONATE CLOTH 2 % EX PADS
6.0000 | MEDICATED_PAD | Freq: Every day | CUTANEOUS | Status: DC
  Administered 2020-09-27 – 2020-09-30 (×4): 6 via TOPICAL

## 2020-09-26 MED ORDER — DOCUSATE SODIUM 100 MG PO CAPS: 100.0000 mg | ORAL_CAPSULE | Freq: Two times a day (BID) | ORAL | Status: DC | PRN

## 2020-09-26 MED ORDER — ADULT MULTIVITAMIN W/MINERALS CH
1.0000 | ORAL_TABLET | Freq: Every day | ORAL | Status: DC
  Administered 2020-09-26 – 2020-09-27 (×2): 1 via ORAL
  Filled 2020-09-26 (×2): qty 1

## 2020-09-26 MED ORDER — BUSPIRONE HCL 15 MG PO TABS
15.0000 mg | ORAL_TABLET | Freq: Three times a day (TID) | ORAL | Status: DC
  Administered 2020-09-26 – 2020-09-30 (×13): 15 mg via ORAL
  Filled 2020-09-26 (×13): qty 1

## 2020-09-26 MED ORDER — ONDANSETRON HCL 4 MG/2ML IJ SOLN
4.0000 mg | Freq: Four times a day (QID) | INTRAMUSCULAR | Status: DC | PRN
  Administered 2020-09-27: 4 mg via INTRAVENOUS
  Filled 2020-09-26: qty 2

## 2020-09-26 NOTE — ED Triage Notes (Signed)
Pt BIB [facility] for N/V and tremors. Concerns for ETOH withdrawal.  Symptoms began last night, worsening this morning.  Pt was diaphoretic with EMS.  Pt received 4 Zofran and 448m of NS.  Unknown when last solid food intake was.  Pt last drink was this morning but vomited up some of that.

## 2020-09-26 NOTE — ED Provider Notes (Signed)
[facility] Provider Note   CSN: [id number] Arrival date & time: 09/26/20  1137     History No chief complaint on file.   Terri Ayala is a 65 y.o. female.  HPI      65 year old female with a history of alcohol abuse and withdrawal, mitral valve prolapse, hyperlipidemia, fatty liver, supraventricular tachycardia, who presents with concern for shortness of breath, nausea and vomiting.  Reports that she had been sober for a long time, and went to go visit her mother and started drinking again a few months ago.  Reports that she tried to decrease her drinking, however yesterday developed nausea and vomiting, and had been vomiting today.  Reports she has not been able to hold down any alcohol since yesterday, and believes she is in alcohol withdrawal.  Reports that her symptoms feel consistent with prior alcohol withdrawal that she has had, including the sensation of shortness of breath, diffuse body aches, nausea, vomiting, headache, tremors and anxiety.  She also reports chest pain on review of systems.  No history of prior alcohol withdrawal seizures.  She has been admitted to the hospital previously for alcohol withdrawal.  Denies any preceding infectious symptoms, no cough, no known dysuria, no fevers.  Denies black or bloody stools, diarrhea, or hematemesis.   She drove back 11 hours home a few weeks ago.  Reports he does take estrogen.  Denies any specific leg pain or swelling.  No history of DVT or PE   Past Medical History:  Diagnosis Date   Alcohol withdrawal ([place]) 11/2017; 01/04/2018   Alcoholism ([place])    Anxiety    Arthritis    "hands" (01/04/2018)   ASYMPTOMATIC POSTMENOPAUSAL STATUS 08/17/2009   Chronic lower back pain    Depression    DYSPHAGIA 06/19/2007   Fatty liver, alcoholic    GERD (gastroesophageal reflux disease)    GOITER, MULTINODULAR 08/17/2009   Hyperlipidemia    Migraines    "not sure what triggers them; I'll have 1  q couple weeks or more; come 2 days in a row when they come" (01/04/2018)   Mitral valve prolapse    OSTEOPOROSIS 08/17/2009   PONV (postoperative nausea and vomiting)    Supraventricular tachycardia ([place])     Patient Active Problem List   Diagnosis Date Noted   History of colonic polyps 03/25/2020   Hemorrhage of rectum and anus 03/25/2020   Gastroparesis 03/25/2020   Gastroesophageal reflux disease 03/25/2020   Flatulence, eructation and gas pain 03/25/2020   Family history of malignant neoplasm of gastrointestinal tract [id number]   Eosinophilic esophagitis [id number]   Constipation 03/25/2020   Change in bowel habit 03/25/2020   Hospital discharge follow-up [id number]   Metabolic acidosis 0000000   Abnormal liver function 02/06/2019   Alcohol withdrawal delirium ([place]) 02/06/2019   Elevated LFTs 07/12/2018   Healthcare maintenance 06/26/2018   Rib fracture 01/04/2018   Chest pain 12/08/2017   Leukocytosis 12/08/2017   Essential hypertension 12/08/2017   Alcohol withdrawal ([place]) 07/21/2017   Lower urinary tract infectious disease 07/21/2017   Osteopenia 08/27/2016   Alcohol dependence with withdrawal ([place]) 01/27/2016   Generalized anxiety disorder 01/27/2016   Severe episode of recurrent major depressive disorder, without psychotic features ([place]) 01/27/2016   Major depressive disorder, recurrent episode, moderate (HCC)    Alcohol use disorder, severe, dependence ([place]) 04/03/2014   Lumbosacral radiculopathy at L5 05/20/2013   Nonspecific elevation of levels of transaminase or lactic acid dehydrogenase (LDH) 03/16/2013  Hepatic steatosis 03/16/2013   Nausea and vomiting 03/16/2013   Chronic cholecystitis 03/14/2013   Cervical arthritis 10/12/2012   Palpitations 11/29/2010   SVT (supraventricular tachycardia) ([place]) 11/29/2010   Mitral valve prolapse 11/29/2010   Decreased libido 11/15/2010   GOITER, MULTINODULAR 08/17/2009   ANXIETY 08/17/2009   Migraine headache  08/17/2009   Hearing loss 08/17/2009   Osteoporosis 08/17/2009   ASYMPTOMATIC POSTMENOPAUSAL STATUS 08/17/2009   DYSPHAGIA 06/19/2007    Past Surgical History:  Procedure Laterality Date   BREAST BIOPSY Right    "benign"   CATARACT EXTRACTION W/ INTRAOCULAR LENS  IMPLANT, BILATERAL     CERVICAL CONE BIOPSY  2000s   CERVIX LESION DESTRUCTION  1991   "dysplasia; lesions"   CHOLECYSTECTOMY N/A 03/14/2013   Procedure: LAPAROSCOPIC CHOLECYSTECTOMY WITH ATTEMPTED INTRAOPERATIVE CHOLANGIOGRAM;  Surgeon: Harl Bowie, MD;  Location: [facility];  Service: General;  Laterality: N/A;   laporoscopic abdominal surgery     "for endometriosis"   LEFT HEART CATH AND CORONARY ANGIOGRAPHY N/A 05/07/2018   Procedure: LEFT HEART CATH AND CORONARY ANGIOGRAPHY;  Surgeon: Belva Crome, MD;  Location: [facility];  Service: Cardiovascular;  Laterality: N/A;     OB History   No obstetric history on file.     Family History  Problem Relation Age of Onset   Colon cancer Father        Deceased, 43   Osteoporosis Mother        Living, 42   Healthy Brother    Healthy Brother    Healthy Son    Healthy Son    Thyroid disease Neg Hx    Goiter Neg Hx     Social History   Tobacco Use   Smoking status: Former    Packs/day: 1.00    Years: 7.00    Pack years: 7.00    Types: Cigarettes    Quit date: 02/28/1974    Years since quitting: 46.6   Smokeless tobacco: Never   Tobacco comments:    01/04/2018 "smoked when I was a teenager"  Vaping Use   Vaping Use: Never used  Substance Use Topics   Alcohol use: Yes    Alcohol/week: 28.0 standard drinks    Types: 28 Glasses of wine per week    Comment: 01/04/2018 "1 1/2 bottles of wine/day"   Drug use: Never    Home Medications Prior to Admission medications   Medication Sig Start Date End Date Taking? Authorizing Provider  albuterol (VENTOLIN HFA) 108 (90 Base) MCG/ACT inhaler INHALE 2 PUFFS INTO THE LUNGS EVERY 6 HOURS AS NEEDED FOR WHEEZING  OR SHORTNESS OF BREATH. Patient taking differently: Inhale 2 puffs into the lungs every 6 (six) hours as needed for wheezing or shortness of breath. 04/27/20  Yes Lorrene Reid, PA-C  alendronate (FOSAMAX) 70 MG tablet Take 70 mg by mouth every Monday. Take with a full glass of water on an empty stomach.   Yes [provider]  aspirin EC 81 MG tablet Take 81 mg by mouth daily.   Yes [provider]  busPIRone (BUSPAR) 15 MG tablet Take 15 mg by mouth 3 (three) times daily.   Yes [provider]  DULoxetine (CYMBALTA) 60 MG capsule TAKE 1 CAPSULE (60 MG TOTAL) BY MOUTH DAILY. Patient taking differently: Take 60 mg by mouth 2 (two) times daily. 10/23/14  Yes Denita Lung, MD  hydrOXYzine (ATARAX/VISTARIL) 25 MG tablet Take 25 mg by mouth 3 (three) times daily.   Yes [provider]  ibuprofen (ADVIL) 200 MG tablet Take 400 mg by mouth every 6 (six) hours as needed for headache or mild pain.   Yes [provider]  meloxicam (MOBIC) 15 MG tablet Take 1 tablet (15 mg total) by mouth daily. Patient taking differently: Take 15 mg by mouth daily as needed for pain. 05/26/20  Yes Hyatt, Max T, DPM  Multiple Vitamin (MULTIVITAMIN WITH MINERALS) TABS tablet Take 1 tablet by mouth daily. 01/10/18  Yes Mikhail, [place], DO  norethindrone-ethinyl estradiol (FEMHRT 1/5) 1-5 MG-MCG TABS Take 1 tablet by mouth daily. For Osteoporosis Patient taking differently: Take 1 tablet by mouth daily. 04/07/14  Yes Nwoko, Herbert Pun I, NP  pantoprazole (PROTONIX) 40 MG tablet TAKE 1 TABLET BY MOUTH 2 TIMES DAILY. Patient taking differently: Take 40 mg by mouth 2 (two) times daily. 04/22/20  Yes Abonza, Maritza, PA-C  propranolol (INDERAL) 10 MG tablet Take 1 tablet (10 mg total) by mouth 2 (two) times daily. 09/14/20  Yes Abonza, Maritza, PA-C  SUMAtriptan (IMITREX) 100 MG tablet TAKE 1 TABLET BY MOUTH AT THE FIRST SIGN OF MIGRAINE, MAY REPEAT IN 2 HOURS. MAX 2 TABLETS IN 24 HOURS Patient  taking differently: Take 100 mg by mouth every 2 (two) hours as needed for migraine. 05/22/20  Yes Abonza, Maritza, PA-C  traZODone (DESYREL) 50 MG tablet Take 50 mg by mouth at bedtime.   Yes [provider]    Allergies    Doxycycline, Codeine, and Tetracycline hcl  Review of Systems   Review of Systems  Constitutional:  Positive for appetite change. Negative for fever.  HENT:  Negative for sore throat.   Eyes:  Negative for visual disturbance.  Respiratory:  Positive for shortness of breath. Negative for cough.   Cardiovascular:  Positive for chest pain.  Gastrointestinal:  Positive for abdominal pain, nausea and vomiting. Negative for constipation and diarrhea.  Genitourinary:  Negative for difficulty urinating.  Musculoskeletal:  Positive for arthralgias and myalgias. Negative for back pain and neck pain.  Skin:  Negative for rash.  Neurological:  Positive for headaches. Negative for syncope.  Psychiatric/Behavioral:  Negative for hallucinations. The patient is nervous/anxious.    Physical Exam Updated Vital Signs BP 127/72   Pulse (!) 123   Temp 99.2 F (37.3 C) (Oral)   Resp (!) 21   Ht '5\' 2"'$  (1.575 m)   Wt 59 kg   SpO2 96%   BMI 23.78 kg/m   Physical Exam Vitals and nursing note reviewed.  Constitutional:      General: She is not in acute distress.    Appearance: She is well-developed. She is not diaphoretic.  HENT:     Head: Normocephalic and atraumatic.  Eyes:     Conjunctiva/sclera: Conjunctivae normal.  Cardiovascular:     Rate and Rhythm: Regular rhythm. Tachycardia present.     Heart sounds: Normal heart sounds. No murmur heard.   No friction rub. No gallop.  Pulmonary:     Effort: Pulmonary effort is normal. No respiratory distress.     Breath sounds: Normal breath sounds. No wheezing or rales.  Abdominal:     General: There is no distension.     Palpations: Abdomen is soft.     Tenderness: There is no abdominal tenderness. There is no  guarding.  Musculoskeletal:        General: No tenderness.     Cervical back: Normal range of motion.  Skin:    General: Skin is warm and dry.  Findings: No erythema or rash.  Neurological:     Mental Status: She is alert and oriented to person, place, and time.     GCS: GCS eye subscore is 4. GCS verbal subscore is 5. GCS motor subscore is 6.     Motor: Tremor present.    ED Results / Procedures / Treatments   Labs (all labs ordered are listed, but only abnormal results are displayed) Labs Reviewed  CBC WITH DIFFERENTIAL/PLATELET - Abnormal; Notable for the following components:      Result Value   WBC 15.9 (*)    Neutro Abs 14.4 (*)    Lymphs Abs 0.5 (*)    Abs Immature Granulocytes 0.08 (*)    All other components within normal limits  COMPREHENSIVE METABOLIC PANEL - Abnormal; Notable for the following components:   Chloride 96 (*)    CO2 13 (*)    Glucose, Bld 109 (*)    AST 157 (*)    Total Bilirubin 1.3 (*)    Anion gap 27 (*)    All other components within normal limits  LACTIC ACID, PLASMA - Abnormal; Notable for the following components:   Lactic Acid, Venous 10.5 (*)    All other components within normal limits  LACTIC ACID, PLASMA - Abnormal; Notable for the following components:   Lactic Acid, Venous 5.5 (*)    All other components within normal limits  D-DIMER, QUANTITATIVE - Abnormal; Notable for the following components:   D-Dimer, Quant 0.87 (*)    All other components within normal limits  URINALYSIS, ROUTINE W REFLEX MICROSCOPIC - Abnormal; Notable for the following components:   Specific Gravity, Urine 1.039 (*)    Hgb urine dipstick SMALL (*)    Ketones, ur 20 (*)    Protein, ur 100 (*)    Leukocytes,Ua SMALL (*)    All other components within normal limits  ETHANOL - Abnormal; Notable for the following components:   Alcohol, Ethyl (B) 107 (*)    All other components within normal limits  BETA-HYDROXYBUTYRIC ACID - Abnormal; Notable for the  following components:   Beta-Hydroxybutyric Acid 3.51 (*)    All other components within normal limits  CBC - Abnormal; Notable for the following components:   WBC 11.7 (*)    RBC 3.44 (*)    Hemoglobin 11.6 (*)    HCT 33.5 (*)    Platelets 128 (*)    All other components within normal limits  I-STAT VENOUS BLOOD GAS, ED - Abnormal; Notable for the following components:   pH, Ven 7.431 (*)    pCO2, Ven 32.9 (*)    pO2, Ven 101.0 (*)    Calcium, Ion 1.03 (*)    All other components within normal limits  RESP PANEL BY RT-PCR (FLU A&B, COVID) ARPGX2  MRSA NEXT GEN BY PCR, NASAL  CULTURE, BLOOD (ROUTINE X 2)  CULTURE, BLOOD (ROUTINE X 2)  LIPASE, BLOOD  RAPID URINE DRUG SCREEN, HOSP PERFORMED  PROTIME-INR  AMMONIA  HIV ANTIBODY (ROUTINE TESTING W REFLEX)  CREATININE, SERUM  LACTIC ACID, PLASMA  LACTIC ACID, PLASMA  COMPREHENSIVE METABOLIC PANEL  MAGNESIUM  PHOSPHORUS  CBC  TROPONIN I (HIGH SENSITIVITY)  TROPONIN I (HIGH SENSITIVITY)    EKG EKG Interpretation  Date/Time:  Saturday September 26 2020 11:43:45 EDT Ventricular Rate:  128 PR Interval:  131 QRS Duration: 94 QT Interval:  333 QTC Calculation: 486 R Axis:   56 Text Interpretation: Sinus tachycardia Borderline prolonged QT interval Since prior ECG< rate  has increased Confirmed by Gareth Morgan [phone]) on 09/26/2020 10:50:19 PM  Radiology CT ABDOMEN PELVIS W CONTRAST  Result Date: 09/26/2020 CLINICAL DATA:  Epigastric abdominal pain. Lactic acidosis. Possible alcohol withdrawal. EXAM: CT ABDOMEN AND PELVIS WITH CONTRAST TECHNIQUE: Multidetector CT imaging of the abdomen and pelvis was performed using the standard protocol following bolus administration of intravenous contrast. CONTRAST:  19m OMNIPAQUE IOHEXOL 300 MG/ML  SOLN COMPARISON:  Right upper quadrant abdomen ultrasound dated 03/16/2019. Abdomen and pelvis CT dated 01/29/2013. FINDINGS: Lower chest: Normal sized heart.  Clear lung bases. Hepatobiliary:  Marked diffuse low density of the liver relative to the spleen with improvement. Cholecystectomy clips. Pancreas: Unremarkable. No pancreatic ductal dilatation or surrounding inflammatory changes. Spleen: Normal in size without focal abnormality. Adrenals/Urinary Tract: Normal appearing adrenal glands. Small left renal cyst. Normal appearing right kidney, ureters and urinary bladder. Stomach/Bowel: Mild-to-moderate diffuse low density wall thickening involving the distal esophagus. Unremarkable stomach, small bowel and appendix. Vascular/Lymphatic: Mild atheromatous arterial calcifications. No enlarged lymph nodes. Reproductive: Uterus and bilateral adnexa are unremarkable. Other: Small umbilical hernia containing fat. Musculoskeletal: Minimal lumbar and lower thoracic spine degenerative changes. IMPRESSION: 1. Findings compatible with esophagitis involving the distal esophagus, possibly due to reflux. 2. Marked diffuse hepatic steatosis with some improvement. Electronically Signed   By: SClaudie ReveringM.D.   On: 09/26/2020 17:33   DG Chest Portable 1 View  Result Date: 09/26/2020 CLINICAL DATA:  Shortness of breath and chest pain EXAM: PORTABLE CHEST 1 VIEW COMPARISON:  03/27/2020 FINDINGS: The heart size and mediastinal contours are within normal limits. Both lungs are clear. Remote right posterior rib fracture deformities are again noted. IMPRESSION: No active cardiopulmonary abnormalities. Electronically Signed   By: TKerby MoorsM.D.   On: 09/26/2020 12:40    Procedures .Critical Care  Date/Time: 09/26/2020 10:49 PM Performed by: SGareth Morgan MD Authorized by: SGareth Morgan MD   Critical care provider statement:    Critical care time (minutes):  45   Critical care was time spent personally by me on the following activities:  Discussions with consultants, evaluation of patient's response to treatment, examination of patient, ordering and performing treatments and interventions, ordering  and review of laboratory studies, ordering and review of radiographic studies, pulse oximetry, re-evaluation of patient's condition, obtaining history from patient or surrogate and review of old charts   Medications Ordered in ED Medications  busPIRone (BUSPAR) tablet 15 mg (15 mg Oral Given 09/26/20 2117)  DULoxetine (CYMBALTA) DR capsule 60 mg (60 mg Oral Given 09/26/20 1820)  traZODone (DESYREL) tablet 50 mg (has no administration in time range)  albuterol (VENTOLIN HFA) 108 (90 Base) MCG/ACT inhaler 2 puff (has no administration in time range)  docusate sodium (COLACE) capsule 100 mg (has no administration in time range)  polyethylene glycol (MIRALAX / GLYCOLAX) packet 17 g (has no administration in time range)  heparin injection 5,000 Units (has no administration in time range)  ondansetron (ZOFRAN) injection 4 mg (has no administration in time range)  pantoprazole (PROTONIX) injection 40 mg (has no administration in time range)  dextrose 5 % and 0.45 % NaCl with KCl 20 mEq/L infusion ( Intravenous New Bag/Given 09/26/20 1819)  LORazepam (ATIVAN) injection 1-2 mg (2 mg Intravenous Given 09/26/20 1738)  thiamine tablet 100 mg (100 mg Oral Given [id number]  folic acid (FOLVITE) tablet 1 mg (1 mg Oral Given 09/26/20 1820)  multivitamin with minerals tablet 1 tablet (1 tablet Oral Given 09/26/20 1820)  vancomycin (VANCOCIN) IVPB 1000 mg/200 mL  premix (has no administration in time range)  ceFEPIme (MAXIPIME) 2 g in sodium chloride 0.9 % 100 mL IVPB (has no administration in time range)  metroNIDAZOLE (FLAGYL) IVPB 500 mg (has no administration in time range)  Chlorhexidine Gluconate Cloth 2 % PADS 6 each (has no administration in time range)  lactated ringers bolus 1,000 mL (0 mLs Intravenous Stopped 09/26/20 1407)  LORazepam (ATIVAN) injection 2 mg (2 mg Intravenous Given 09/26/20 1207)  sodium chloride 0.9 % bolus 1,000 mL ( Intravenous Paused 09/26/20 1751)  LORazepam (ATIVAN) injection 2 mg  (2 mg Intravenous Given 09/26/20 1548)  metroNIDAZOLE (FLAGYL) IVPB 500 mg (0 mg Intravenous Stopped 09/26/20 1701)  sodium chloride 0.9 % bolus 1,000 mL (1,000 mLs Intravenous New Bag/Given 09/26/20 1553)  ceFEPIme (MAXIPIME) 2 g in sodium chloride 0.9 % 100 mL IVPB (0 g Intravenous Stopped 09/26/20 1736)  vancomycin (VANCOREADY) IVPB 1250 mg/250 mL (0 mg Intravenous Stopped 09/26/20 1731)  iohexol (OMNIPAQUE) 300 MG/ML solution 100 mL (100 mLs Intravenous Contrast Given 09/26/20 1707)    ED Course  I have reviewed the triage vital signs and the nursing notes.  Pertinent labs & imaging results that were available during my care of the patient were reviewed by me and considered in my medical decision making (see chart for details).    MDM Rules/Calculators/A&P                            64 year old female with a history of alcohol abuse and withdrawal, mitral valve prolapse, hyperlipidemia, fatty liver, supraventricular tachycardia, who presents with concern for shortness of breath, nausea and vomiting, alcohol withdrawal.  Differential diagnosis includes alcohol withdrawal, sepsis, pulmonary embolus, dehydration, viral syndrome, ACS, alcoholic ketoacidosis.  She is tachycardic, with tremors, nausea and vomiting on arrival to the emergency department, and reports that she had similar symptoms with alcohol withdrawal in the past.  Clinically, have high suspicion for this given she does not describe other infectious symptoms.  She does, however describe shortness of breath, and a drive 11 hours a few weeks ago with history of estrogen use and consider PE in the differential and order D-dimer.  Ordered initial lab work, Ativan, D-dimer.  EKG showed sinus tachycardia.  Chest x-ray shows no signs of pneumonia, pulmonary edema.  COVID and flu testing are negative.  Troponin is negative and doubt ACS.  D-dimer is positive and plan on CT PE study.  Lab work is significant for anion gap metabolic  acidosis with a bicarb of 13, anion gap of 27, lactic acid above 10.  Suspect her lactic acidosis is likely due to underlying liver disease, alcohol use, dehydration, and continue to have low suspicion for sepsis, however in setting of leukocytosis, will order empiric antibiotics as well as blood cultures for coverage.  Consulted ICU given significant lactic acidosis, alcohol withdrawal, tachycardia.   Final Clinical Impression(s) / ED Diagnoses Final diagnoses:  Alcohol withdrawal syndrome with complication (HCC)  Lactic acidosis  Alcoholic ketoacidosis  Dehydration  Dyspnea, unspecified type  Sinus tachycardia    Rx / DC Orders ED Discharge Orders     None        Gareth Morgan, MD 09/26/20 2300

## 2020-09-26 NOTE — ED Notes (Signed)
Patient placed on a purewick.  

## 2020-09-26 NOTE — Progress Notes (Signed)
Pharmacy Antibiotic Note  Terri Ayala is a 65 y.o. female admitted on 09/26/2020 with sepsis.  Pharmacy has been consulted for cefepime and vancomycin dosing.  SCr is 0.82 which is at baseline for the patient. WBC 15.9. LA 10.5.  Plan: Cefepime 2g q12h Vancomycin 1250 mg once then vancomycin '1000mg'$  q24h unless change in renal function for eAUC 483.4 F/u CCM recommendations, cultures De-escalate antibiotics as appropriate  Monitor renal function Levels as indicated     Temp (24hrs), Avg:97.6 F (36.4 C), Min:97.6 F (36.4 C), Max:97.6 F (36.4 C)  Recent Labs  Lab 09/26/20 1210  WBC 15.9*  CREATININE 0.82  LATICACIDVEN 10.5*    CrCl cannot be calculated (Unknown ideal weight.).    Allergies  Allergen Reactions   Doxycycline Swelling    Mouth swelling and sores   Codeine Itching   Tetracycline Hcl Swelling    Mouth swelling and sores    Antimicrobials this admission: cefepime 7/30 >>  vancomycin 7/30 >>  Metronidazole 7/30 >>  Microbiology results: Pending  Thank you for allowing pharmacy to be a part of this patient's care.  Lorelei Pont, PharmD, BCPS 09/26/2020 2:19 PM ED Clinical Pharmacist -  [phone]

## 2020-09-26 NOTE — ED Notes (Signed)
Lab called to advise that lactic acid was 10.5 on draw.  EDP Dr. Billy Fischer has been notified. Pt has had 1L LR so far.

## 2020-09-26 NOTE — H&P (Addendum)
NAME:  Terri Ayala, MRN:  [id number], DOB:  [birth year], LOS: 0 ADMISSION DATE:  09/26/2020, CONSULTATION DATE:  7/30 REFERRING MD:  Daiva Eves, CHIEF COMPLAINT:  Weakness   History of Present Illness:  65 y/o female with a history of alcohol abuse presented to the [facility] ER on 7/30 complaining of weakness, nausea, vomiting, and chest/epigastric pain.  She says that she drinks 2 bottles of wine a day and recently tried to cut back.  She stopped this morning after having 2 days of profound nausea and vomiting.  She says that the vomitus was nonbilious and nonbloody.  Her last bowel movement was yesterday which was normal.  She has had some p.o. intake this week but in the last 24 hours she has not had very much to eat.  In the emergency room she complained of epigastric and chest pain which has been constant.  Nothing makes it better or worse.  It does not radiate.  She does note some mild dyspnea.  Denies cough.  She says that the dyspnea started this morning.  Of note, she had an 11-hour drive about 2 weeks ago.  In the emergency department she has been given IV fluids and antibiotics.  She was noted to have a lactic acid of 10.  She has not been hypotensive however she has been profoundly tachycardic and diaphoretic.  Pulmonary and critical care medicine was consulted for further evaluation.  On further history she says that she is tremulous and somewhat anxious.  Last drink was this morning.  Pertinent  Medical History   Alcohol abuse with history of withdrawal Anxiety Chronic low back pain Depression Dysphagia  Fatty liver GERD Hyperlipidemia   Significant Hospital Events: Including procedures, antibiotic start and stop dates in addition to other pertinent events   7/30 admission  Interim History / Subjective:  As above  Objective   Blood pressure (!) 146/89, pulse (!) 128, temperature 97.6 F (36.4 C), temperature source Oral, resp. rate 19, SpO2 97 %.        Intake/Output Summary  (Last 24 hours) at 09/26/2020 1612 Last data filed at 09/26/2020 1407 Gross per 24 hour  Intake 1400 ml  Output --  Net 1400 ml   There were no vitals filed for this visit.  Examination: General:  Chronically ill appearing female anxious, tremulous in bed HENT: NCAT OP clear PULM: CTA B, normal effort CV: tachycardia, no mgr GI: BS+, soft, nontender MSK: normal bulk and tone Derm: diaphoresis Neuro: awake, alert, tremulous, MAEW   Resolved Hospital Problem list     Assessment & Plan:  Sinus tachycardia with elevated lactic acid in setting of alcohol withdrawal: Unclear what the unifying diagnosis is here, concern for underlying cirrhosis, however with epigastric pain in setting of several days of nausea vomiting concern for bowel injury (esophageal tear?).  Also noted to have chest pain and shortness of breath, ER checking for pulmonary embolism which seems less likely considering the GI symptoms she is experiencing.  Elevated lactic acid, uncertain etiology: CT abdomen pelvis with contrast Continue IV fluids : Bolus LR 2 more liters, then D5 half-normal saline at 75 cc an hour Serial lactic acid Serial abdominal exams IV PPI  Sepsis without septic shock? Continue vanc/cefepime/flagyl for now F/u blood cultures F/u CT report  Chest pain and shortness of breath: Again, presumably related to nausea and vomiting with gastritis but differential diagnosis includes acute pulmonary embolism Follow-up CT angiogram chest ordered by the emergency room Monitor respiratory status closely  in ICU setting  Alcohol withdrawal Alcoholic fatty liver disease Concern for cirrhosis Follow-up CT chest abdomen pelvis ICU alcohol withdrawal protocol, hold Precedex, use Ativan as needed Thiamine, folate Counsel to quit  Depression/anxiety Continue home BuSpar Continue home duloxetine   Best Practice (right click and "Reselect all SmartList Selections" daily)   Diet/type: NPO DVT  prophylaxis: systemic heparin GI prophylaxis: PPI Lines: N/A Foley:  N/A Code Status:  full code Last date of multidisciplinary goals of care discussion [n/a]  Labs   CBC: Recent Labs  Lab 09/26/20 1210 09/26/20 1506  WBC 15.9*  --   NEUTROABS 14.4*  --   HGB 14.0 13.9  HCT 41.3 41.0  MCV 98.3  --   PLT 163  --     Basic Metabolic Panel: Recent Labs  Lab 09/26/20 1210 09/26/20 1506  NA 136 137  K 4.6 3.8  CL 96*  --   CO2 13*  --   GLUCOSE 109*  --   BUN 18  --   CREATININE 0.82  --   CALCIUM 9.1  --    GFR: CrCl cannot be calculated (Unknown ideal weight.). Recent Labs  Lab 09/26/20 1210 09/26/20 1444  WBC 15.9*  --   LATICACIDVEN 10.5* 5.5*    Liver Function Tests: Recent Labs  Lab 09/26/20 1210  AST 157*  ALT 29  ALKPHOS 83  BILITOT 1.3*  PROT 7.9  ALBUMIN 4.7   Recent Labs  Lab 09/26/20 1210  LIPASE 34   Recent Labs  Lab 09/26/20 1444  AMMONIA 16    ABG    Component Value Date/Time   HCO3 21.9 09/26/2020 1506   TCO2 23 09/26/2020 1506   ACIDBASEDEF 2.0 09/26/2020 1506   O2SAT 98.0 09/26/2020 1506     Coagulation Profile: Recent Labs  Lab 09/26/20 1444  INR 1.0    Cardiac Enzymes: No results for input(s): CKTOTAL, CKMB, CKMBINDEX, TROPONINI in the last 168 hours.  HbA1C: Hgb A1c MFr Bld  Date/Time Value Ref Range Status  01/10/2020 10:38 AM 4.7 (L) 4.8 - 5.6 % Final    Comment:             Prediabetes: 5.7 - 6.4          Diabetes: >6.4          Glycemic control for adults with diabetes: <7.0   07/09/2018 09:31 AM 4.7 (L) 4.8 - 5.6 % Final    Comment:             Prediabetes: 5.7 - 6.4          Diabetes: >6.4          Glycemic control for adults with diabetes: <7.0     CBG: No results for input(s): GLUCAP in the last 168 hours.  Critical care time: 45 minutes     Roselie Awkward, MD  PCCM Pager: [phone] Cell: [phone] After 7:00 pm call Elink  [phone]

## 2020-09-27 ENCOUNTER — Inpatient Hospital Stay (HOSPITAL_COMMUNITY): Payer: Medicare Other

## 2020-09-27 DIAGNOSIS — F10231 Alcohol dependence with withdrawal delirium: Secondary | ICD-10-CM | POA: Diagnosis not present

## 2020-09-27 DIAGNOSIS — R000 Tachycardia, unspecified: Secondary | ICD-10-CM

## 2020-09-27 DIAGNOSIS — K760 Fatty (change of) liver, not elsewhere classified: Secondary | ICD-10-CM

## 2020-09-27 DIAGNOSIS — R0609 Other forms of dyspnea: Secondary | ICD-10-CM | POA: Diagnosis not present

## 2020-09-27 DIAGNOSIS — E860 Dehydration: Secondary | ICD-10-CM

## 2020-09-27 DIAGNOSIS — E872 Acidosis: Secondary | ICD-10-CM | POA: Diagnosis not present

## 2020-09-27 LAB — COMPREHENSIVE METABOLIC PANEL
ALT: 21 U/L (ref 0–44)
AST: 82 U/L — ABNORMAL HIGH (ref 15–41)
Albumin: 3.7 g/dL (ref 3.5–5.0)
Alkaline Phosphatase: 63 U/L (ref 38–126)
Anion gap: 14 (ref 5–15)
BUN: 10 mg/dL (ref 8–23)
CO2: 22 mmol/L (ref 22–32)
Calcium: 7.7 mg/dL — ABNORMAL LOW (ref 8.9–10.3)
Chloride: 102 mmol/L (ref 98–111)
Creatinine, Ser: 0.71 mg/dL (ref 0.44–1.00)
GFR, Estimated: >60 mL/min (ref >60)
Glucose, Bld: 95 mg/dL (ref 70–99)
Potassium: 3.5 mmol/L (ref 3.5–5.1)
Sodium: 138 mmol/L (ref 135–145)
Total Bilirubin: 1.6 mg/dL — ABNORMAL HIGH (ref 0.3–1.2)
Total Protein: 6.2 g/dL — ABNORMAL LOW (ref 6.5–8.1)

## 2020-09-27 LAB — CBC
HCT: 34.2 % — ABNORMAL LOW (ref 36.0–46.0)
Hemoglobin: 11.9 g/dL — ABNORMAL LOW (ref 12.0–15.0)
MCH: 34.2 pg — ABNORMAL HIGH (ref 26.0–34.0)
MCHC: 34.8 g/dL (ref 30.0–36.0)
MCV: 98.3 fL (ref 80.0–100.0)
Platelets: 129 10*3/uL — ABNORMAL LOW (ref 150–400)
RBC: 3.48 MIL/uL — ABNORMAL LOW (ref 3.87–5.11)
RDW: 13.6 % (ref 11.5–15.5)
WBC: 8.5 10*3/uL (ref 4.0–10.5)
nRBC: 0 % (ref 0.0–0.2)

## 2020-09-27 LAB — ECHOCARDIOGRAM COMPLETE
Height: 62 in
S' Lateral: 2.3 cm
Weight: 2080 oz

## 2020-09-27 LAB — MAGNESIUM: Magnesium: 1.8 mg/dL (ref 1.7–2.4)

## 2020-09-27 LAB — PHOSPHORUS
Phosphorus: 1.9 mg/dL — ABNORMAL LOW (ref 2.5–4.6)
Phosphorus: 2.5 mg/dL (ref 2.5–4.6)

## 2020-09-27 LAB — POTASSIUM: Potassium: 3.3 mmol/L — ABNORMAL LOW (ref 3.5–5.1)

## 2020-09-27 MED ORDER — IOHEXOL 350 MG/ML SOLN
75.0000 mL | Freq: Once | INTRAVENOUS | Status: AC | PRN
  Administered 2020-09-27: 75 mL via INTRAVENOUS

## 2020-09-27 MED ORDER — MAGNESIUM SULFATE 2 GM/50ML IV SOLN
2.0000 g | Freq: Once | INTRAVENOUS | Status: AC
  Administered 2020-09-27: 2 g via INTRAVENOUS
  Filled 2020-09-27: qty 50

## 2020-09-27 MED ORDER — ACETAMINOPHEN 325 MG PO TABS
650.0000 mg | ORAL_TABLET | Freq: Four times a day (QID) | ORAL | Status: DC | PRN
  Administered 2020-09-27 – 2020-09-28 (×4): 650 mg via ORAL
  Filled 2020-09-27 (×4): qty 2

## 2020-09-27 MED ORDER — POTASSIUM PHOSPHATES 15 MMOLE/5ML IV SOLN
20.0000 mmol | Freq: Once | INTRAVENOUS | Status: DC
  Filled 2020-09-27: qty 6.67

## 2020-09-27 MED ORDER — POTASSIUM CHLORIDE CRYS ER 20 MEQ PO TBCR
20.0000 meq | EXTENDED_RELEASE_TABLET | Freq: Once | ORAL | Status: AC
  Administered 2020-09-27: 20 meq via ORAL
  Filled 2020-09-27: qty 1

## 2020-09-27 MED ORDER — ALUM & MAG HYDROXIDE-SIMETH 200-200-20 MG/5ML PO SUSP
30.0000 mL | Freq: Once | ORAL | Status: AC
  Administered 2020-09-27: 30 mL via ORAL
  Filled 2020-09-27: qty 30

## 2020-09-27 MED ORDER — SODIUM PHOSPHATES 45 MMOLE/15ML IV SOLN
20.0000 mmol | Freq: Once | INTRAVENOUS | Status: AC
  Administered 2020-09-27: 20 mmol via INTRAVENOUS
  Filled 2020-09-27: qty 6.67

## 2020-09-27 MED ORDER — PROPRANOLOL HCL 10 MG PO TABS
10.0000 mg | ORAL_TABLET | Freq: Two times a day (BID) | ORAL | Status: DC
  Administered 2020-09-27 – 2020-09-30 (×8): 10 mg via ORAL
  Filled 2020-09-27 (×8): qty 1

## 2020-09-27 NOTE — Progress Notes (Signed)
NAME:  Terri Ayala, MRN:  [id number], DOB:  07/23/1955, LOS: 1 ADMISSION DATE:  09/26/2020, CONSULTATION DATE:  7/30 REFERRING MD:  Daiva Eves, CHIEF COMPLAINT:  Weakness   History of Present Illness:  65 y/o female with a history of alcohol abuse presented to the [facility] ER on 7/30 complaining of weakness, nausea, vomiting, and chest/epigastric pain.  She says that she drinks 2 bottles of wine a day and recently tried to cut back.  She stopped this morning after having 2 days of profound nausea and vomiting.  She says that the vomitus was nonbilious and nonbloody.  Her last bowel movement was yesterday which was normal.  She has had some p.o. intake this week but in the last 24 hours she has not had very much to eat.  In the emergency room she complained of epigastric and chest pain which has been constant.  Nothing makes it better or worse.  It does not radiate.  She does note some mild dyspnea.  Denies cough.  She says that the dyspnea started this morning.  Of note, she had an 11-hour drive about 2 weeks ago.  In the emergency department she has been given IV fluids and antibiotics.  She was noted to have a lactic acid of 10.  She has not been hypotensive however she has been profoundly tachycardic and diaphoretic.  Pulmonary and critical care medicine was consulted for further evaluation.  On further history she says that she is tremulous and somewhat anxious.  Last drink was this morning.  Pertinent  Medical History   Alcohol abuse with history of withdrawal Anxiety Chronic low back pain Depression Dysphagia  Fatty liver GERD Hyperlipidemia   Significant Hospital Events: Including procedures, antibiotic start and stop dates in addition to other pertinent events   7/30 admission  Interim History / Subjective:  This morning tremulous, low CIWA scores. Having pain all over but also in her belly.  Endorses previous etoh withdrawal but never seizures or ICU stay.   Objective   Blood  pressure 129/87, pulse (!) 118, temperature 98.7 F (37.1 C), temperature source Oral, resp. rate 19, height '5\' 2"'$  (1.575 m), weight 59 kg, SpO2 99 %.        Intake/Output Summary (Last 24 hours) at 09/27/2020 1204 Last data filed at 09/27/2020 0800 Gross per 24 hour  Intake 4521.59 ml  Output 1150 ml  Net 3371.59 ml   Filed Weights   09/26/20 1745  Weight: 59 kg    Examination: General:  chronically ill appearing, pale, not in distress PULM: CTA B, normal effort, no wheezes or crackles CV: tachycardic, no mrg GI: BS+, soft, nontender MSK: normal bulk and tone Derm: no rashes Neuro: awake, alert, tremulous  Labs personally reviewed Na 138 K 3.5 Cr 0.7 WBC 8.5 Hgb 11.9 Platelets 129 CTPE negative for PE CT A/P shows diffuse hepatic steatosis  Resolved Hospital Problem list   Lactic acidosis  Assessment & Plan:  Terri Ayala is a 65 y.o. woman who presents with:  Acute Alcohol Withdrawal Syndrome Acute liver injury secondary to acute alcohol intoxication AST>ALT Alcoholic and fatty liver disease Continue CIWA protocol with prn ativan Thiamine, folate Counsel to quit Will need sw consult for sources on quitting  Sepsis without septic shock? - blood cultures so far negative - sepsis has been considered and ruled out - suspect SIRS criteria secondary to etoh withdrawal  Depression/anxiety Continue home BuSpar Continue home duloxetine   Best Practice (right click and "Reselect all  SmartList Selections" daily)   Diet/type: NPO DVT prophylaxis: systemic heparin GI prophylaxis: PPI Lines: N/A Foley:  N/A Code Status:  full code Last date of multidisciplinary goals of care discussion [n/a]  She is appropriate for transfer to PCU. Will sign out to [facility] to assume care tomorrow.  Lenice Llamas, MD Pulmonary and [place]   Labs   CBC: Recent Labs  Lab 09/26/20 1210 09/26/20 1506 09/26/20 1808 09/27/20 0140  WBC 15.9*   --  11.7* 8.5  NEUTROABS 14.4*  --   --   --   HGB 14.0 13.9 11.6* 11.9*  HCT 41.3 41.0 33.5* 34.2*  MCV 98.3  --  97.4 98.3  PLT 163  --  128* 129*    Basic Metabolic Panel: Recent Labs  Lab 09/26/20 1210 09/26/20 1506 09/26/20 1808 09/27/20 0140  NA 136 137  --  138  K 4.6 3.8  --  3.5  CL 96*  --   --  102  CO2 13*  --   --  22  GLUCOSE 109*  --   --  95  BUN 18  --   --  10  CREATININE 0.82  --  0.65 0.71  CALCIUM 9.1  --   --  7.7*  MG  --   --   --  1.8  PHOS  --   --   --  1.9*   GFR: Estimated Creatinine Clearance: 54.7 mL/min (by C-G formula based on SCr of 0.71 mg/dL). Recent Labs  Lab 09/26/20 1210 09/26/20 1444 09/26/20 1808 09/26/20 2008 09/27/20 0140  WBC 15.9*  --  11.7*  --  8.5  LATICACIDVEN 10.5* 5.5* 1.3 1.0  --     Liver Function Tests: Recent Labs  Lab 09/26/20 1210 09/27/20 0140  AST 157* 82*  ALT 29 21  ALKPHOS 83 63  BILITOT 1.3* 1.6*  PROT 7.9 6.2*  ALBUMIN 4.7 3.7   Recent Labs  Lab 09/26/20 1210  LIPASE 34   Recent Labs  Lab 09/26/20 1444  AMMONIA 16    ABG    Component Value Date/Time   HCO3 21.9 09/26/2020 1506   TCO2 23 09/26/2020 1506   ACIDBASEDEF 2.0 09/26/2020 1506   O2SAT 98.0 09/26/2020 1506     Coagulation Profile: Recent Labs  Lab 09/26/20 1444  INR 1.0    Cardiac Enzymes: No results for input(s): CKTOTAL, CKMB, CKMBINDEX, TROPONINI in the last 168 hours.  HbA1C: Hgb A1c MFr Bld  Date/Time Value Ref Range Status  01/10/2020 10:38 AM 4.7 (L) 4.8 - 5.6 % Final    Comment:             Prediabetes: 5.7 - 6.4          Diabetes: >6.4          Glycemic control for adults with diabetes: <7.0   07/09/2018 09:31 AM 4.7 (L) 4.8 - 5.6 % Final    Comment:             Prediabetes: 5.7 - 6.4          Diabetes: >6.4          Glycemic control for adults with diabetes: <7.0     CBG: No results for input(s): GLUCAP in the last 168 hours.

## 2020-09-27 NOTE — Progress Notes (Signed)
eLink Physician-Brief Progress Note Patient Name: Terri Ayala DOB: Dec 22, 1955 MRN: [id number]   Date of Service  09/27/2020  HPI/Events of Note  On K containing fluids and has low mag, K of 3.5 and phos of 1.9  eICU Interventions  20 meq oral K, 20 mmol of sodium phos  2 gram mag Repeat K and phos at 1130 am      Intervention Category Major Interventions: Electrolyte abnormality - evaluation and management  Margaretmary Lombard 09/27/2020, 4:13 AM

## 2020-09-27 NOTE — Progress Notes (Signed)
Echocardiogram 2D Echocardiogram has been performed.  Oneal Deputy Yvette Roark RDCS 09/27/2020, 2:42 PM

## 2020-09-28 DIAGNOSIS — F10239 Alcohol dependence with withdrawal, unspecified: Secondary | ICD-10-CM

## 2020-09-28 LAB — BASIC METABOLIC PANEL
Anion gap: 9 (ref 5–15)
BUN: <5 mg/dL — ABNORMAL LOW (ref 8–23)
CO2: 23 mmol/L (ref 22–32)
Calcium: 7.7 mg/dL — ABNORMAL LOW (ref 8.9–10.3)
Chloride: 103 mmol/L (ref 98–111)
Creatinine, Ser: 0.63 mg/dL (ref 0.44–1.00)
GFR, Estimated: >60 mL/min (ref >60)
Glucose, Bld: 115 mg/dL — ABNORMAL HIGH (ref 70–99)
Potassium: 3.2 mmol/L — ABNORMAL LOW (ref 3.5–5.1)
Sodium: 135 mmol/L (ref 135–145)

## 2020-09-28 LAB — CBC
HCT: 36.8 % (ref 36.0–46.0)
Hemoglobin: 12.5 g/dL (ref 12.0–15.0)
MCH: 33.4 pg (ref 26.0–34.0)
MCHC: 34 g/dL (ref 30.0–36.0)
MCV: 98.4 fL (ref 80.0–100.0)
Platelets: 122 10*3/uL — ABNORMAL LOW (ref 150–400)
RBC: 3.74 MIL/uL — ABNORMAL LOW (ref 3.87–5.11)
RDW: 12.7 % (ref 11.5–15.5)
WBC: 6.6 10*3/uL (ref 4.0–10.5)
nRBC: 0 % (ref 0.0–0.2)

## 2020-09-28 MED ORDER — CHLORDIAZEPOXIDE HCL 25 MG PO CAPS
25.0000 mg | ORAL_CAPSULE | ORAL | Status: AC
  Administered 2020-09-30 (×2): 25 mg via ORAL
  Filled 2020-09-28 (×2): qty 1

## 2020-09-28 MED ORDER — LORAZEPAM 2 MG/ML IJ SOLN: 1.0000 mg | INTRAMUSCULAR | Status: DC | PRN

## 2020-09-28 MED ORDER — LORAZEPAM 2 MG/ML IJ SOLN: 0.0000 mg | Freq: Two times a day (BID) | INTRAMUSCULAR | Status: DC

## 2020-09-28 MED ORDER — THIAMINE HCL 100 MG/ML IJ SOLN: 100.0000 mg | Freq: Every day | INTRAMUSCULAR | Status: DC

## 2020-09-28 MED ORDER — ADULT MULTIVITAMIN W/MINERALS CH
1.0000 | ORAL_TABLET | Freq: Every day | ORAL | Status: DC
  Administered 2020-09-28 – 2020-09-30 (×3): 1 via ORAL
  Filled 2020-09-28 (×3): qty 1

## 2020-09-28 MED ORDER — FOLIC ACID 1 MG PO TABS
1.0000 mg | ORAL_TABLET | Freq: Every day | ORAL | Status: DC
  Administered 2020-09-28 – 2020-09-30 (×3): 1 mg via ORAL
  Filled 2020-09-28 (×3): qty 1

## 2020-09-28 MED ORDER — LORAZEPAM 1 MG PO TABS
1.0000 mg | ORAL_TABLET | ORAL | Status: DC | PRN
  Administered 2020-09-28: 1 mg via ORAL
  Administered 2020-09-28: 2 mg via ORAL
  Administered 2020-09-30: 1 mg via ORAL
  Filled 2020-09-28: qty 2
  Filled 2020-09-28 (×2): qty 1

## 2020-09-28 MED ORDER — LORAZEPAM 2 MG/ML IJ SOLN: 0.0000 mg | Freq: Four times a day (QID) | INTRAMUSCULAR | Status: DC

## 2020-09-28 MED ORDER — CHLORDIAZEPOXIDE HCL 25 MG PO CAPS: 25.0000 mg | ORAL_CAPSULE | Freq: Every day | ORAL | Status: DC

## 2020-09-28 MED ORDER — POTASSIUM CHLORIDE CRYS ER 20 MEQ PO TBCR
40.0000 meq | EXTENDED_RELEASE_TABLET | Freq: Once | ORAL | Status: AC
  Administered 2020-09-28: 40 meq via ORAL
  Filled 2020-09-28: qty 2

## 2020-09-28 MED ORDER — CHLORDIAZEPOXIDE HCL 25 MG PO CAPS
25.0000 mg | ORAL_CAPSULE | Freq: Four times a day (QID) | ORAL | Status: AC
  Administered 2020-09-28 (×4): 25 mg via ORAL
  Filled 2020-09-28 (×4): qty 1

## 2020-09-28 MED ORDER — CHLORDIAZEPOXIDE HCL 25 MG PO CAPS
25.0000 mg | ORAL_CAPSULE | Freq: Three times a day (TID) | ORAL | Status: AC
  Administered 2020-09-29 (×3): 25 mg via ORAL
  Filled 2020-09-28 (×3): qty 1

## 2020-09-28 MED ORDER — THIAMINE HCL 100 MG PO TABS
100.0000 mg | ORAL_TABLET | Freq: Every day | ORAL | Status: DC
  Administered 2020-09-28 – 2020-09-30 (×3): 100 mg via ORAL
  Filled 2020-09-28 (×3): qty 1

## 2020-09-28 NOTE — Progress Notes (Signed)
eLink Physician-Brief Progress Note Patient Name: Terri Ayala DOB: 08/04/1955 MRN: [id number]   Date of Service  09/28/2020  HPI/Events of Note  Notified of K 3.2 Normal renal function Already on D5 0.45 with 20 meqs K  eICU Interventions  Give additional K 40 meqs PO x 1     Intervention Category Major Interventions: Electrolyte abnormality - evaluation and management  Judd Lien 09/28/2020, 4:08 AM

## 2020-09-28 NOTE — Progress Notes (Signed)
[facility] Progress Note  Patient: Terri Ayala    ACZ:[id number]  DOA: 09/26/2020     Date of Service: the patient was seen and examined on 09/28/2020  Brief hospital course: Past medical history of anxiety, alcohol abuse, chronic low back pain, depression, dysphagia, GERD, HLD.  Presents with complaints of nausea and vomiting. Found to have alcohol withdrawal and acute liver injury with SIRS. Admitted to the ICU started on Precedex. Now transferred to [facility]. Currently plan is monitor for withdrawal.  Subjective: No abdominal pain no nausea no vomiting right now.  Primary complaint is depression and anxiety.  Denies any suicidal ideation.  No chest pain.  No headache.  Oral intake improving.  Assessment and Plan: 1.  Acute alcohol withdrawal symptoms. Acute liver injury secondary to alcohol intoxication. Fatty liver disease. Initiated on Precedex. CIWA protocol initiated. Will initiate scheduled Librium. Monitor for now.  Continue thiamine and folic acid.  2.  SIRS. Sepsis ruled out. Initial concern was septic. Patient met SIRS criteria. Likely secondary to alcohol withdrawal. Cultures so far negative. No antibiotics.  3.  Depression and anxiety. No evidence of suicidal ideation right now. Continuing home regimen as well as Librium. Will monitor response.  4.  Diarrhea. Does not appear to have any evidence of C. difficile. CMO recommended no testing as well. Currently minimal quantity, no blood in the stool. Monitor.  5.  Mild hypokalemia Will replace.  6.  Thrombocytopenia Chronic. Secondary to alcohol abuse. Monitor for now.  Scheduled Meds:  busPIRone  15 mg Oral TID   chlordiazePOXIDE  25 mg Oral QID   Followed by   Derrill Memo ON 09/29/2020] chlordiazePOXIDE  25 mg Oral TID   Followed by   Derrill Memo ON 09/30/2020] chlordiazePOXIDE  25 mg Oral BH-qamhs   Followed by   Derrill Memo ON 10/01/2020] chlordiazePOXIDE  25 mg Oral Daily   Chlorhexidine Gluconate Cloth  6 each  Topical Q0600   DULoxetine  60 mg Oral Daily   folic acid  1 mg Oral Daily   heparin  5,000 Units Subcutaneous Q8H   multivitamin with minerals  1 tablet Oral Daily   pantoprazole (PROTONIX) IV  40 mg Intravenous QHS   propranolol  10 mg Oral BID   thiamine  100 mg Oral Daily   Or   thiamine  100 mg Intravenous Daily   Continuous Infusions: PRN Meds: acetaminophen, albuterol, LORazepam **OR** LORazepam, ondansetron (ZOFRAN) IV, traZODone  Body mass index is 23.79 kg/m.        DVT Prophylaxis:   heparin injection 5,000 Units Start: 09/26/20 2200    Advance goals of care discussion: Pt is Full code.  Family Communication: no family was present at bedside, at the time of interview.   Data Reviewed: I have personally reviewed and interpreted daily labs, tele strips, imaging. CBC stable.  Platelets stable.  No leukocytosis.  LFT checked yesterday improving.  Physical Exam:  General: Appear in mild distress, no Rash; Oral Mucosa Clear, moist. no Abnormal Neck Mass Or lumps, Conjunctiva normal  Cardiovascular: S1 and S2 Present, no Murmur, Respiratory: good respiratory effort, Bilateral Air entry present and CTA, no Crackles, no wheezes Abdomen: Bowel Sound present, Soft and no tenderness Extremities: no Pedal edema Neurology: alert and oriented to time, place, and person affect anxious. no new focal deficit Gait not checked due to patient safety concerns  Vitals:   09/28/20 1600 09/28/20 1700 09/28/20 1800 09/28/20 1832  BP: 138/86   123/80  Pulse: 75 83 70 93  Resp: 19 15 (!) 22   Temp: 98.6 F (37 C)     TempSrc: Oral     SpO2: 97% 98% 98%   Weight:      Height:        Disposition:  Status is: Inpatient  Remains inpatient appropriate because:IV treatments appropriate due to intensity of illness or inability to take PO and Inpatient level of care appropriate due to severity of illness  Dispo: The patient is from: Home              Anticipated d/c is to: SNF               Patient currently is not medically stable to d/c.   Difficult to place patient No  Time spent: 35 minutes. I reviewed all nursing notes, pharmacy notes, vitals, pertinent old records. I have discussed plan of care as described above with RN.  Author: Berle Mull, MD [facility] 09/28/2020 7:48 PM  To reach On-call, see care teams to locate the attending and reach out via [email or web address]. Between 7PM-7AM, please contact night-coverage If you still have difficulty reaching the attending provider, please page the [facility] (Director on Call) for [facility] on amion for assistance.

## 2020-09-29 LAB — BASIC METABOLIC PANEL
Anion gap: 9 (ref 5–15)
BUN: 5 mg/dL — ABNORMAL LOW (ref 8–23)
CO2: 24 mmol/L (ref 22–32)
Calcium: 8.4 mg/dL — ABNORMAL LOW (ref 8.9–10.3)
Chloride: 102 mmol/L (ref 98–111)
Creatinine, Ser: 0.74 mg/dL (ref 0.44–1.00)
GFR, Estimated: >60 mL/min (ref >60)
Glucose, Bld: 96 mg/dL (ref 70–99)
Potassium: 3.5 mmol/L (ref 3.5–5.1)
Sodium: 135 mmol/L (ref 135–145)

## 2020-09-29 LAB — CBC
HCT: 36.3 % (ref 36.0–46.0)
Hemoglobin: 12.4 g/dL (ref 12.0–15.0)
MCH: 33.6 pg (ref 26.0–34.0)
MCHC: 34.2 g/dL (ref 30.0–36.0)
MCV: 98.4 fL (ref 80.0–100.0)
Platelets: 127 10*3/uL — ABNORMAL LOW (ref 150–400)
RBC: 3.69 MIL/uL — ABNORMAL LOW (ref 3.87–5.11)
RDW: 12.8 % (ref 11.5–15.5)
WBC: 5.7 10*3/uL (ref 4.0–10.5)
nRBC: 0 % (ref 0.0–0.2)

## 2020-09-29 LAB — MAGNESIUM: Magnesium: 2.3 mg/dL (ref 1.7–2.4)

## 2020-09-29 MED ORDER — LOPERAMIDE HCL 1 MG/7.5ML PO SUSP
1.0000 mg | Freq: Once | ORAL | Status: AC
  Administered 2020-09-29: 1 mg via ORAL
  Filled 2020-09-29: qty 7.5

## 2020-09-29 MED ORDER — ZINC OXIDE 40 % EX OINT
TOPICAL_OINTMENT | CUTANEOUS | Status: DC | PRN
  Filled 2020-09-29: qty 57

## 2020-09-29 MED ORDER — ORAL CARE MOUTH RINSE
15.0000 mL | Freq: Two times a day (BID) | OROMUCOSAL | Status: DC
  Administered 2020-09-29 – 2020-09-30 (×3): 15 mL via OROMUCOSAL

## 2020-09-29 NOTE — Progress Notes (Signed)
Physical Therapy Treatment Patient Details Name: Terri Ayala MRN: [id number] DOB: [birth year] Today's Date: 09/29/2020    History of Present Illness Pt is a 65 y.o. F who presents with alcohol withdrawal sympatoms and acute liver injury with SIRS. Admitted to ICU and started on Precedex. Significant PMH: alcohol abuse, chronic LBP, depression, dysphagia    PT Comments    Prior to admission, pt lives alone and is independent. Pt presents with decreased functional mobility secondary to balance deficits, decreased activity tolerance, and functional weakness. Pt ambulating ~20 ft to bathroom and back with a walker and min-mod assist. Suspect good progress given expected course and continued medical management of alcohol withdrawal symptoms. Will continue to follow for gait and balance training.    Follow Up Recommendations  Home health PT;Supervision for mobility/OOB (pt refusing SNF)     Equipment Recommendations  None recommended by PT (pt has needed DME)    Recommendations for Other Services       Precautions / Restrictions Precautions Precautions: Fall Restrictions Weight Bearing Restrictions: No    Mobility  Bed Mobility Overal bed mobility: Needs Assistance Bed Mobility: Supine to Sit     Supine to sit: Supervision          Transfers Overall transfer level: Needs assistance Equipment used: Rolling walker (2 wheeled) Transfers: Sit to/from Stand Sit to Stand: Min guard         General transfer comment: good power up to standing  Ambulation/Gait Ambulation/Gait assistance: Min assist;Mod assist Gait Distance (Feet): 20 Feet Assistive device: Rolling walker (2 wheeled) Gait Pattern/deviations: Step-through pattern;Decreased stride length Gait velocity: decreased Gait velocity interpretation: <1.8 ft/sec, indicate of risk for recurrent falls General Gait Details: Pt initially requiring minA, demonstrates tremulousness, bilateral knee instability. One episode of  lateral LOB during turn, requiring modA to correct.   Stairs             Wheelchair Mobility    Modified Rankin (Stroke Patients Only)       Balance Overall balance assessment: Needs assistance Sitting-balance support: Feet supported Sitting balance-Leahy Scale: Good     Standing balance support: Bilateral upper extremity supported Standing balance-Leahy Scale: Poor Standing balance comment: reliant on external support                            Cognition Arousal/Alertness: Awake/alert Behavior During Therapy: WFL for tasks assessed/performed Overall Cognitive Status: Within Functional Limits for tasks assessed                                        Exercises      General Comments        Pertinent Vitals/Pain Pain Assessment: Faces Faces Pain Scale: Hurts little more Pain Location: BLE's Pain Descriptors / Indicators: Aching;Tingling Pain Intervention(s): Monitored during session    Home Living Family/patient expects to be discharged to:: Private residence Living Arrangements: Alone Available Help at Discharge: Friend(s);Available PRN/intermittently Type of Home: House Home Access: Stairs to enter   Home Layout: Two level Home Equipment: Environmental consultant - 2 wheels;Cane - single point;Bedside commode;Shower seat      Prior Function Level of Independence: Independent      Comments: community ambulatory, has a dog   PT Goals (current goals can now be found in the care plan section) Acute Rehab PT Goals Patient Stated Goal: go home by Friday  PT Goal Formulation: With patient Time For Goal Achievement: 10/13/20 Potential to Achieve Goals: Good    Frequency    Min 3X/week      PT Plan      Co-evaluation              AM-PAC PT "6 Clicks" Mobility   Outcome Measure  Help needed turning from your back to your side while in a flat bed without using bedrails?: None Help needed moving from lying on your back to  sitting on the side of a flat bed without using bedrails?: A Little Help needed moving to and from a bed to a chair (including a wheelchair)?: A Little Help needed standing up from a chair using your arms (e.g., wheelchair or bedside chair)?: A Little Help needed to walk in hospital room?: A Lot Help needed climbing 3-5 steps with a railing? : A Lot 6 Click Score: 17    End of Session Equipment Utilized During Treatment: Gait belt Activity Tolerance: Patient tolerated treatment well Patient left: in chair;with call bell/phone within reach;with chair alarm set Nurse Communication: Mobility status PT Visit Diagnosis: Unsteadiness on feet (R26.81);Muscle weakness (generalized) (M62.81);Difficulty in walking, not elsewhere classified (R26.2)     Time: [id number] PT Time Calculation (min) (ACUTE ONLY): 18 min  Charges:                        Wyona Almas, PT, DPT Acute Rehabilitation Services Pager [phone] Office [phone]    Deno Etienne 09/29/2020, 12:50 PM

## 2020-09-29 NOTE — TOC Initial Note (Signed)
Transition of Care [facility]) - Initial/Assessment Note    Patient Details  Name: Terri Ayala MRN: [id number] Date of Birth: 04/20/1955  [facility]) CM/SW Contact:    Benard Halsted, LCSW Phone Number: 09/29/2020, 3:49 PM  Clinical Narrative:                 CSW received consult for possible home health services at time of discharge. CSW spoke with patient. Patient reported that she would like home health services.  Patient reports no preference for agency but does not want the one she has used in the past (per chart review it was [facility]). CSW discussed equipment needs with patient and she requested a rolling walker. She stated she has one but it is on the second floor and she is unable to get up the stair to the second floor. CSW will reach out to [facility] to see if patient is able to receive another. CSW confirmed PCP Mariel Kansky, [place]) and address on Facesheet with patient. Patient requested a ride home at discharge.  CSW spoke with patient about her alcohol use. She stated that she has been sober but went to stay with her mom for a little while to help her and did not have access to her ETOH support system. She declined resources and stated she will be able to reach out to her local resources for help now that she is back in town. No other needs identified at this time.    Expected Discharge Plan: [place] Barriers to Discharge: Continued Medical Work up   Patient Goals and CMS Choice Patient states their goals for this hospitalization and ongoing recovery are:: return home and be able to walk up stairs CMS Medicare.gov Compare Post Acute Care list provided to:: Patient Choice offered to / list presented to : Patient  Expected Discharge Plan and Services Expected Discharge Plan: [place] In-house Referral: Clinical Social Work Discharge Planning Services: CM Consult Post Acute Care Choice: Home Health, Durable Medical Equipment Living  arrangements for the past 2 months: Single Family Home                                      Prior Living Arrangements/Services Living arrangements for the past 2 months: Single Family Home Lives with:: Self Patient language and need for interpreter reviewed:: Yes Do you feel safe going back to the place where you live?: Yes      Need for Family Participation in Patient Care: No (Comment) Care giver support system in place?: No (comment) Current home services: DME (has walker but its on the second floor and she can't get to it) Criminal Activity/Legal Involvement Pertinent to Current Situation/Hospitalization: No - Comment as needed  Activities of Daily Living Home Assistive Devices/Equipment: None ADL Screening (condition at time of admission) Patient's cognitive ability adequate to safely complete daily activities?: Yes Is the patient deaf or have difficulty hearing?: Yes Does the patient have difficulty seeing, even when wearing glasses/contacts?: No Does the patient have difficulty concentrating, remembering, or making decisions?: No Patient able to express need for assistance with ADLs?: Yes Does the patient have difficulty dressing or bathing?: No Independently performs ADLs?: Yes (appropriate for developmental age) Does the patient have difficulty walking or climbing stairs?: Yes Weakness of Legs: None Weakness of Arms/Hands: None  Permission Sought/Granted Permission sought to share information with :  Facility Art therapist granted to share information with : Yes, Verbal Permission Granted     Permission granted to share info w AGENCY: [facility]        Emotional Assessment Appearance:: Appears stated age Attitude/Demeanor/Rapport: Engaged Affect (typically observed): Accepting, Appropriate Orientation: : Oriented to Self, Oriented to Place, Oriented to  Time, Oriented to Situation Alcohol / Substance Use: Alcohol Use Psych Involvement: No  (comment)  Admission diagnosis:  Alcohol withdrawal delirium ([place]) [F10.231] Patient Active Problem List   Diagnosis Date Noted   History of colonic polyps 03/25/2020   Hemorrhage of rectum and anus 03/25/2020   Gastroparesis 03/25/2020   Gastroesophageal reflux disease 03/25/2020   Flatulence, eructation and gas pain 03/25/2020   Family history of malignant neoplasm of gastrointestinal tract [id number]   Eosinophilic esophagitis 99991111   Constipation 03/25/2020   Change in bowel habit 03/25/2020   Hospital discharge follow-up [id number]   Metabolic acidosis 0000000   Abnormal liver function 02/06/2019   Alcohol withdrawal delirium ([place]) 02/06/2019   Elevated LFTs 07/12/2018   Healthcare maintenance 06/26/2018   Rib fracture 01/04/2018   Chest pain 12/08/2017   Leukocytosis 12/08/2017   Essential hypertension 12/08/2017   Alcohol withdrawal ([place]) 07/21/2017   Lower urinary tract infectious disease 07/21/2017   Osteopenia 08/27/2016   Alcohol dependence with withdrawal ([place]) 01/27/2016   Generalized anxiety disorder 01/27/2016   Severe episode of recurrent major depressive disorder, without psychotic features ([place]) 01/27/2016   Major depressive disorder, recurrent episode, moderate ([facility])    Alcohol use disorder, severe, dependence ([place]) 04/03/2014   Lumbosacral radiculopathy at L5 05/20/2013   Nonspecific elevation of levels of transaminase or lactic acid dehydrogenase (LDH) 03/16/2013   Hepatic steatosis 03/16/2013   Nausea and vomiting 03/16/2013   Chronic cholecystitis 03/14/2013   Cervical arthritis 10/12/2012   Palpitations 11/29/2010   SVT (supraventricular tachycardia) ([place]) 11/29/2010   Mitral valve prolapse 11/29/2010   Decreased libido 11/15/2010   GOITER, MULTINODULAR 08/17/2009   ANXIETY 08/17/2009   Migraine headache 08/17/2009   Hearing loss 08/17/2009   Osteoporosis 08/17/2009   ASYMPTOMATIC POSTMENOPAUSAL STATUS 08/17/2009   DYSPHAGIA 06/19/2007    PCP:  Lorrene Reid, PA-C Pharmacy:   [place], [address] [place] [address] Phone: [phone] Fax: [phone]     Social Determinants of Health (SDOH) Interventions    Readmission Risk Interventions No flowsheet data found.

## 2020-09-29 NOTE — Progress Notes (Signed)
[facility] Progress Note  Patient: Terri Ayala    [id number]  DOA: 09/26/2020     Date of Service: the patient was seen and examined on 09/29/2020  Brief hospital course: Past medical history of anxiety, alcohol abuse, chronic low back pain, depression, dysphagia, GERD, HLD.  Presents with complaints of nausea and vomiting. Found to have alcohol withdrawal and acute liver injury with SIRS. Admitted to the ICU started on Precedex. Now transferred to [facility]. Currently plan is arrange for safe discharge plan for PT, PT wants to reevaluate the patient tomorrow for final recommendation  Subjective: No acute complaint.  No nausea no vomiting.  No abdominal pain.  Has some loose BM.  No shortness of breath.  Mood improving.  Assessment and Plan: 1.  Acute alcohol withdrawal symptoms. Acute liver injury secondary to alcohol intoxication. Fatty liver disease. Initiated on Precedex. CIWA protocol initiated. Will initiate scheduled Librium.  Likely will require gabapentin on discharge. Monitor for now.  Continue thiamine and folic acid.  2.  SIRS. Sepsis ruled out. Initial concern was septic. Patient met SIRS criteria. Likely secondary to alcohol withdrawal. Cultures so far negative. No antibiotics.  3.  Depression and anxiety. No evidence of suicidal ideation right now. Continuing home regimen as well as Librium.  Likely will require gabapentin on discharge. Will monitor response.  4.  Diarrhea. Does not appear to have any evidence of C. difficile. CMO recommended no testing as well. Currently minimal quantity, no blood in the stool. Monitor.  5.  Mild hypokalemia Will replace.  6.  Thrombocytopenia Chronic. Secondary to alcohol abuse. Monitor for now.  7.  Deconditioning. PT recommends home with PT, although requesting continuing hospitalization for evaluation  Scheduled Meds:  busPIRone  15 mg Oral TID   chlordiazePOXIDE  25 mg Oral TID   Followed by   Derrill Memo  ON 09/30/2020] chlordiazePOXIDE  25 mg Oral BH-qamhs   Followed by   Derrill Memo ON 10/01/2020] chlordiazePOXIDE  25 mg Oral Daily   Chlorhexidine Gluconate Cloth  6 each Topical Q0600   DULoxetine  60 mg Oral Daily   folic acid  1 mg Oral Daily   heparin  5,000 Units Subcutaneous Q8H   multivitamin with minerals  1 tablet Oral Daily   pantoprazole (PROTONIX) IV  40 mg Intravenous QHS   propranolol  10 mg Oral BID   thiamine  100 mg Oral Daily   Or   thiamine  100 mg Intravenous Daily   Continuous Infusions: PRN Meds: acetaminophen, albuterol, liver oil-zinc oxide, LORazepam **OR** LORazepam, ondansetron (ZOFRAN) IV, traZODone  Body mass index is 23.95 kg/m.        DVT Prophylaxis:   heparin injection 5,000 Units Start: 09/26/20 2200    Advance goals of care discussion: Pt is Full code.  Family Communication: no family was present at bedside, at the time of interview.   Data Reviewed: I have personally reviewed and interpreted daily labs, tele strips, imaging. BMP stable.  CBC stable.  Physical Exam:  General: Appear in mild distress, no Rash; Oral Mucosa Clear, moist. no Abnormal Neck Mass Or lumps, Conjunctiva normal  Cardiovascular: S1 and S2 Present, no Murmur, Respiratory: good respiratory effort, Bilateral Air entry present and CTA, no Crackles, no wheezes Abdomen: Bowel Sound present, Soft and no tenderness Extremities: no Pedal edema Neurology: alert and oriented to time, place, and person affect appropriate. no new focal deficit Gait not checked due to patient safety concerns   Vitals:   09/29/20 1000 09/29/20  1100 09/29/20 1200 09/29/20 1600  BP:   113/74   Pulse: 88 97 91   Resp: _0 Temp:    98.6 F (37 C)  TempSrc:    Oral  SpO2: 95% 98% 97%   Weight:      Height:        Disposition:  Status is: Inpatient  Remains inpatient appropriate because:IV treatments appropriate due to intensity of illness or inability to take PO and Inpatient level of  care appropriate due to severity of illness  Dispo: The patient is from: Home              Anticipated d/c is to: SNF              Patient currently is not medically stable to d/c.   Difficult to place patient No  Time spent: 35 minutes. I reviewed all nursing notes, pharmacy notes, vitals, pertinent old records. I have discussed plan of care as described above with RN.  Author: Berle Mull, MD [facility] 09/29/2020 6:19 PM  To reach On-call, see care teams to locate the attending and reach out via [email or web address]. Between 7PM-7AM, please contact night-coverage If you still have difficulty reaching the attending provider, please page the [facility] (Director on Call) for [facility] on amion for assistance.

## 2020-09-30 ENCOUNTER — Encounter (HOSPITAL_COMMUNITY): Payer: Self-pay | Admitting: Pulmonary Disease

## 2020-09-30 ENCOUNTER — Other Ambulatory Visit: Payer: Self-pay

## 2020-09-30 LAB — CBC
HCT: 35.1 % — ABNORMAL LOW (ref 36.0–46.0)
Hemoglobin: 11.9 g/dL — ABNORMAL LOW (ref 12.0–15.0)
MCH: 33.4 pg (ref 26.0–34.0)
MCHC: 33.9 g/dL (ref 30.0–36.0)
MCV: 98.6 fL (ref 80.0–100.0)
Platelets: 133 10*3/uL — ABNORMAL LOW (ref 150–400)
RBC: 3.56 MIL/uL — ABNORMAL LOW (ref 3.87–5.11)
RDW: 12.7 % (ref 11.5–15.5)
WBC: 5.4 10*3/uL (ref 4.0–10.5)
nRBC: 0 % (ref 0.0–0.2)

## 2020-09-30 LAB — BASIC METABOLIC PANEL
Anion gap: 12 (ref 5–15)
BUN: 10 mg/dL (ref 8–23)
CO2: 23 mmol/L (ref 22–32)
Calcium: 9.1 mg/dL (ref 8.9–10.3)
Chloride: 102 mmol/L (ref 98–111)
Creatinine, Ser: 0.71 mg/dL (ref 0.44–1.00)
GFR, Estimated: >60 mL/min (ref >60)
Glucose, Bld: 97 mg/dL (ref 70–99)
Potassium: 3.6 mmol/L (ref 3.5–5.1)
Sodium: 137 mmol/L (ref 135–145)

## 2020-09-30 LAB — MAGNESIUM: Magnesium: 2.1 mg/dL (ref 1.7–2.4)

## 2020-09-30 MED ORDER — PANTOPRAZOLE SODIUM 40 MG PO TBEC
40.0000 mg | DELAYED_RELEASE_TABLET | Freq: Every day | ORAL | Status: DC
  Administered 2020-09-30: 40 mg via ORAL
  Filled 2020-09-30: qty 1

## 2020-09-30 MED ORDER — GABAPENTIN 100 MG PO CAPS: ORAL_CAPSULE | ORAL | 0 refills | Status: DC

## 2020-09-30 MED ORDER — ACETAMINOPHEN 325 MG PO TABS: 650.0000 mg | ORAL_TABLET | Freq: Four times a day (QID) | ORAL | Status: DC | PRN

## 2020-09-30 NOTE — Discharge Summary (Addendum)
Physician Discharge Summary  Terri Ayala [id number] DOB: 1955-04-18 DOA: 09/26/2020  PCP: Lorrene Reid, PA-C  Admit date: 09/26/2020 Discharge date: 09/30/2020  Time spent: 27 minutes  Recommendations for Outpatient Follow-up:  Requires Chem-12 CBC in about 1 week Requires continued engagement regarding alcohol cessation Will need outpatient resources  Discharge Diagnoses:  MAIN problem for hospitalization   Acute alcohol intoxication with acute liver injury secondary to CIWA score  Please see below for itemized issues addressed in [name]- refer to other progress notes for clarity if needed  Discharge Condition: Improved  Diet recommendation: Heart healthy triglyceride controlled  Filed Weights   09/28/20 0500 09/29/20 0500 09/30/20 0500  Weight: 59 kg 59.4 kg 59.6 kg    History of present illness:  65 white female-multiple episodes admission ethanolism including [facility] admission 2015-multiple hospitalization 2019-->2021, still drinks 2 bottles of wine per day SVT HLD, comorbid depression  Admit CCM 09/26/2020 sinus tachycardia lactic acidosis of 10 severe hypotension (does have underlying hypertension)-found to have acute liver injury on admission slightly elevated bilirubin 1.6 AST 82 WBC on admission 15 Bicarb 13 BUN/creatinine 18/0.8, lipase 157 beta hydroxybutyrate 3.5  Transferred to hospitalist service 8/1  Hospital Course:  Alcohol withdrawal symptoms Initially on Precedex now much improved--last consistent CIWA in the 0-3 range Discontinue Librium Her thrombocytopenia and anemia is likely secondary to alcoholism Gabapentin rx on d/c limited RX Needs OP f/u in 1 week with PCP to determine if needs this further- she will need to follow-up with AA and her psychiatrist SIRS on admission unclear cause sepsis ruled out Probably secondary to ethanolism no antibiotics needed Bipolar Needs outpatient follow-up patient will resume her home meds including BuSpar  Cymbalta etc. etc. Mild hypokalemia Improved and replaced this admission Diarrhea Unlikely infectious and stabilized prior to discharge   Discharge Exam: Vitals:   09/30/20 1000 09/30/20 1200  BP:    Pulse:    Resp: 13   Temp:  (!) 97.1 F (36.2 C)  SpO2:      Subj on day of d/c   Awake coherent no distress EOMI NCAT asking for ride home no distress no pain  General Exam on discharge  EOMI NCAT older than stated age no icterus no pallor chest clear no added sound abdomen soft nontender nondistended no rebound no guarding ROM intact moving all 4 limbs equally  Discharge Instructions   Discharge Instructions     Diet - low sodium heart healthy   Complete by: As directed    Discharge instructions   Complete by: As directed    Stop drinking please We will get therapy to see you Do not take any ibuprofen or over-the-counter medication and make sure that you only use Tylenol for nonspecific pains Get labs in about a week at your primary physician's office   Increase activity slowly   Complete by: As directed    No wound care   Complete by: As directed       Allergies as of 09/30/2020       Reactions   Doxycycline Swelling   Mouth swelling and sores   Codeine Itching   Tetracycline Hcl Swelling   Mouth swelling and sores        Medication List     STOP taking these medications    aspirin EC 81 MG tablet   ibuprofen 200 MG tablet Commonly known as: ADVIL   meloxicam 15 MG tablet Commonly known as: MOBIC       TAKE these medications  acetaminophen 325 MG tablet Commonly known as: TYLENOL Take 2 tablets (650 mg total) by mouth every 6 (six) hours as needed for moderate pain.   albuterol 108 (90 Base) MCG/ACT inhaler Commonly known as: VENTOLIN HFA INHALE 2 PUFFS INTO THE LUNGS EVERY 6 HOURS AS NEEDED FOR WHEEZING OR SHORTNESS OF BREATH.   alendronate 70 MG tablet Commonly known as: FOSAMAX Take 70 mg by mouth every Monday. Take with a full  glass of water on an empty stomach.   busPIRone 15 MG tablet Commonly known as: BUSPAR Take 15 mg by mouth 3 (three) times daily.   DULoxetine 60 MG capsule Commonly known as: CYMBALTA TAKE 1 CAPSULE (60 MG TOTAL) BY MOUTH DAILY. What changed: when to take this   hydrOXYzine 25 MG tablet Commonly known as: ATARAX/VISTARIL Take 25 mg by mouth 3 (three) times daily.   multivitamin with minerals Tabs tablet Take 1 tablet by mouth daily.   norethindrone-ethinyl estradiol 1-5 MG-MCG Tabs tablet Commonly known as: FEMHRT 1/5 Take 1 tablet by mouth daily. For Osteoporosis What changed: additional instructions   pantoprazole 40 MG tablet Commonly known as: PROTONIX TAKE 1 TABLET BY MOUTH 2 TIMES DAILY.   propranolol 10 MG tablet Commonly known as: INDERAL Take 1 tablet (10 mg total) by mouth 2 (two) times daily.   SUMAtriptan 100 MG tablet Commonly known as: IMITREX TAKE 1 TABLET BY MOUTH AT THE FIRST SIGN OF MIGRAINE, MAY REPEAT IN 2 HOURS. MAX 2 TABLETS IN 24 HOURS What changed: See the new instructions.   traZODone 50 MG tablet Commonly known as: DESYREL Take 50 mg by mouth at bedtime.       Allergies  Allergen Reactions   Doxycycline Swelling    Mouth swelling and sores   Codeine Itching   Tetracycline Hcl Swelling    Mouth swelling and sores      The results of significant diagnostics from this hospitalization (including imaging, microbiology, ancillary and laboratory) are listed below for reference.    Significant Diagnostic Studies: CT Angio Chest PE W and/or Wo Contrast  Result Date: 09/27/2020 CLINICAL DATA:  Dyspnea, chest pain, elevated D-dimer EXAM: CT ANGIOGRAPHY CHEST WITH CONTRAST TECHNIQUE: Multidetector CT imaging of the chest was performed using the standard protocol during bolus administration of intravenous contrast. Multiplanar CT image reconstructions and MIPs were obtained to evaluate the vascular anatomy. CONTRAST:  65m OMNIPAQUE IOHEXOL 350  MG/ML SOLN COMPARISON:  05/05/2018 FINDINGS: Cardiovascular: There is adequate opacification of the pulmonary arterial tree. No intraluminal filling defect identified to suggest acute pulmonary embolism. The central pulmonary arteries are of normal caliber. Minimal coronary artery calcification. Global cardiac size within normal limits. No pericardial effusion. The thoracic aorta is unremarkable. Mediastinum/Nodes: 19 mm nodule within the right thyroid gland appears enlarged since prior examination. Additional 15 mm nodule noted within the left thyroid gland, stable since prior examination. No pathologic thoracic adenopathy. The esophagus is unremarkable. Lungs/Pleura: Mild bibasilar atelectasis or scarring. The lungs are otherwise clear. No pneumothorax or pleural effusion. Central airways are widely patent. Upper Abdomen: Cholecystectomy has been performed. No acute abnormality identified. Musculoskeletal: Multiple healed right rib fractures noted. No acute bone abnormality. No lytic or blastic bone lesions identified. Review of the MIP images confirms the above findings. IMPRESSION: No pulmonary embolism.  No acute intrathoracic pathology identified. Minimal coronary artery calcification. Bilateral thyroid nodules, enlarged on the right. Recommend thyroid [place](ref: J Am Coll Radiol. 2015 Feb;12(2): 143-50). Electronically Signed   By: AFidela SalisburyMD  On: 09/27/2020 00:23   CT ABDOMEN PELVIS W CONTRAST  Result Date: 09/26/2020 CLINICAL DATA:  Epigastric abdominal pain. Lactic acidosis. Possible alcohol withdrawal. EXAM: CT ABDOMEN AND PELVIS WITH CONTRAST TECHNIQUE: Multidetector CT imaging of the abdomen and pelvis was performed using the standard protocol following bolus administration of intravenous contrast. CONTRAST:  160m OMNIPAQUE IOHEXOL 300 MG/ML  SOLN COMPARISON:  Right upper quadrant abdomen ultrasound dated 03/16/2019. Abdomen and pelvis CT dated 01/29/2013. FINDINGS: Lower chest: Normal sized  heart.  Clear lung bases. Hepatobiliary: Marked diffuse low density of the liver relative to the spleen with improvement. Cholecystectomy clips. Pancreas: Unremarkable. No pancreatic ductal dilatation or surrounding inflammatory changes. Spleen: Normal in size without focal abnormality. Adrenals/Urinary Tract: Normal appearing adrenal glands. Small left renal cyst. Normal appearing right kidney, ureters and urinary bladder. Stomach/Bowel: Mild-to-moderate diffuse low density wall thickening involving the distal esophagus. Unremarkable stomach, small bowel and appendix. Vascular/Lymphatic: Mild atheromatous arterial calcifications. No enlarged lymph nodes. Reproductive: Uterus and bilateral adnexa are unremarkable. Other: Small umbilical hernia containing fat. Musculoskeletal: Minimal lumbar and lower thoracic spine degenerative changes. IMPRESSION: 1. Findings compatible with esophagitis involving the distal esophagus, possibly due to reflux. 2. Marked diffuse hepatic steatosis with some improvement. Electronically Signed   By: SClaudie ReveringM.D.   On: 09/26/2020 17:33   DG Chest Portable 1 View  Result Date: 09/26/2020 CLINICAL DATA:  Shortness of breath and chest pain EXAM: PORTABLE CHEST 1 VIEW COMPARISON:  03/27/2020 FINDINGS: The heart size and mediastinal contours are within normal limits. Both lungs are clear. Remote right posterior rib fracture deformities are again noted. IMPRESSION: No active cardiopulmonary abnormalities. Electronically Signed   By: TKerby MoorsM.D.   On: 09/26/2020 12:40   ECHOCARDIOGRAM COMPLETE  Result Date: 09/27/2020    ECHOCARDIOGRAM REPORT   Patient Name:   KLOETTE FAUERBACHDate of Exam: 09/27/2020 Medical Rec #:  [id number]   Height:       62.0 in Accession #:    [id number]  Weight:       130.0 lb Date of Birth:  [birth year]   BSA:          1.592 m Patient Age:    89years     BP:           135/78 mmHg Patient Gender: F            HR:           110 bpm. Exam Location:   Inpatient Procedure: 2D Echo, Color Doppler and Cardiac Doppler Indications:    R06.9 DOE  History:        Patient has prior history of Echocardiogram examinations, most                 recent 05/05/2018. Risk Factors:ETOH Abuse and Dyslipidemia.  Sonographer:    ERaquel SarnaSenior RDCS Referring Phys: [email or web address] 1. Left ventricular ejection fraction, by estimation, is 65 to 70%. The left ventricle has normal function. The left ventricle has no regional wall motion abnormalities. Left ventricular diastolic parameters are indeterminate (difficult tissue Doppler acquisition).  2. Right ventricular systolic function is hyperdynamic. The right ventricular size is normal. Tricuspid regurgitation signal is inadequate for assessing PA pressure.  3. The mitral valve is normal in structure. No evidence of mitral valve regurgitation. No evidence of mitral stenosis.  4. The aortic valve was not well visualized. Aortic valve regurgitation is not visualized. No aortic stenosis is present.  5. The inferior vena cava is normal in size with greater than 50% respiratory variability, suggesting right atrial pressure of 3 mmHg. Comparison(s): A prior study was performed on 05/05/2018. LV and RV function is more vigorous. FINDINGS  Left Ventricle: Left ventricular ejection fraction, by estimation, is 65 to 70%. The left ventricle has normal function. The left ventricle has no regional wall motion abnormalities. The left ventricular internal cavity size was small. There is no left ventricular hypertrophy. Left ventricular diastolic parameters are indeterminate. Right Ventricle: The right ventricular size is normal. No increase in right ventricular wall thickness. Right ventricular systolic function is hyperdynamic. Tricuspid regurgitation signal is inadequate for assessing PA pressure. Left Atrium: Left atrial size was normal in size. Right Atrium: Right atrial size was normal in size. Pericardium: There is no evidence of  pericardial effusion. Mitral Valve: The mitral valve is normal in structure. No evidence of mitral valve regurgitation. No evidence of mitral valve stenosis. Tricuspid Valve: The tricuspid valve is normal in structure. Tricuspid valve regurgitation is not demonstrated. No evidence of tricuspid stenosis. Aortic Valve: The aortic valve was not well visualized. Aortic valve regurgitation is not visualized. No aortic stenosis is present. Pulmonic Valve: The pulmonic valve was not well visualized. Pulmonic valve regurgitation is not visualized. No evidence of pulmonic stenosis. Aorta: The aortic root is normal in size and structure and the ascending aorta was not well visualized. Venous: The inferior vena cava is normal in size with greater than 50% respiratory variability, suggesting right atrial pressure of 3 mmHg. IAS/Shunts: The atrial septum is grossly normal.  LEFT VENTRICLE PLAX 2D LVIDd:         3.60 cm LVIDs:         2.30 cm LV PW:         1.00 cm LV IVS:        1.00 cm LVOT diam:     2.00 cm LV SV:         62 LV SV Index:   39 LVOT Area:     3.14 cm  RIGHT VENTRICLE RV S prime:     17.00 cm/s TAPSE (M-mode): 2.0 cm LEFT ATRIUM             Index       RIGHT ATRIUM           Index LA diam:        3.30 cm 2.07 cm/m  RA Area:     10.90 cm LA Vol (A2C):   49.1 ml 30.84 ml/m RA Volume:   21.60 ml  13.57 ml/m LA Vol (A4C):   52.8 ml 33.17 ml/m LA Biplane Vol: 54.1 ml 33.99 ml/m  AORTIC VALVE LVOT Vmax:   114.00 cm/s LVOT Vmean:  86.900 cm/s LVOT VTI:    0.196 m  AORTA Ao Root diam: 3.10 cm  SHUNTS Systemic VTI:  0.20 m Systemic Diam: 2.00 cm Rudean Haskell MD Electronically signed by Rudean Haskell MD Signature Date/Time: 09/27/2020/3:34:34 PM    Final     Microbiology: Recent Results (from the past 240 hour(s))  Resp Panel by RT-PCR (Flu A&B, Covid) Nasopharyngeal Swab     Status: None   Collection Time: 09/26/20 12:10 PM   Specimen: Nasopharyngeal Swab; Nasopharyngeal(NP) swabs in vial  transport medium  Result Value Ref Range Status   SARS Coronavirus 2 by RT PCR NEGATIVE NEGATIVE Final    Comment: (NOTE) SARS-CoV-2 target nucleic acids are NOT DETECTED.  The SARS-CoV-2 RNA is generally detectable in  upper respiratory specimens during the acute phase of infection. The lowest concentration of SARS-CoV-2 viral copies this assay can detect is 138 copies/mL. A negative result does not preclude SARS-Cov-2 infection and should not be used as the sole basis for treatment or other patient management decisions. A negative result may occur with  improper specimen collection/handling, submission of specimen other than nasopharyngeal swab, presence of viral mutation(s) within the areas targeted by this assay, and inadequate number of viral copies(<138 copies/mL). A negative result must be combined with clinical observations, patient history, and epidemiological information. The expected result is Negative.  Fact Sheet for Patients:  EntrepreneurPulse.com.au  Fact Sheet for Healthcare Providers:  IncredibleEmployment.be  This test is no t yet approved or cleared by the [place] FDA and  has been authorized for detection and/or diagnosis of SARS-CoV-2 by FDA under an Emergency Use Authorization (EUA). This EUA will remain  in effect (meaning this test can be used) for the duration of the COVID-19 declaration under Section 564(b)(1) of the Act, 21 U.S.C.section 360bbb-3(b)(1), unless the authorization is terminated  or revoked sooner.       Influenza A by PCR NEGATIVE NEGATIVE Final   Influenza B by PCR NEGATIVE NEGATIVE Final    Comment: (NOTE) The Xpert Xpress SARS-CoV-2/FLU/RSV plus assay is intended as an aid in the diagnosis of influenza from Nasopharyngeal swab specimens and should not be used as a sole basis for treatment. Nasal washings and aspirates are unacceptable for Xpert Xpress SARS-CoV-2/FLU/RSV testing.  Fact  Sheet for Patients: EntrepreneurPulse.com.au  Fact Sheet for Healthcare Providers: IncredibleEmployment.be  This test is not yet approved or cleared by the [place] FDA and has been authorized for detection and/or diagnosis of SARS-CoV-2 by FDA under an Emergency Use Authorization (EUA). This EUA will remain in effect (meaning this test can be used) for the duration of the COVID-19 declaration under Section 564(b)(1) of the Act, 21 U.S.C. section 360bbb-3(b)(1), unless the authorization is terminated or revoked.  Performed at [facility], [place] [address], [place], [place] [address]   Blood culture (routine x 2)     Status: None (Preliminary result)   Collection Time: 09/26/20 12:10 PM   Specimen: BLOOD RIGHT HAND  Result Value Ref Range Status   Specimen Description BLOOD RIGHT HAND  Final   Special Requests   Final    BOTTLES DRAWN AEROBIC AND ANAEROBIC Blood Culture adequate volume   Culture   Final    NO GROWTH 4 DAYS Performed at [facility], [place] [address] [place], [place] [address]    Report Status PENDING  Incomplete  Blood culture (routine x 2)     Status: None (Preliminary result)   Collection Time: 09/26/20  2:44 PM   Specimen: BLOOD RIGHT ARM  Result Value Ref Range Status   Specimen Description BLOOD RIGHT ARM  Final   Special Requests   Final    BOTTLES DRAWN AEROBIC AND ANAEROBIC Blood Culture adequate volume   Culture   Final    NO GROWTH 4 DAYS Performed at [facility], [place] [address] [place],  [address]    Report Status PENDING  Incomplete  MRSA Next Gen by PCR, Nasal     Status: None   Collection Time: 09/26/20  5:30 PM   Specimen: Nasal Mucosa; Nasal Swab  Result Value Ref Range Status   MRSA by PCR Next Gen NOT DETECTED NOT DETECTED Final    Comment: (NOTE) The GeneXpert MRSA Assay (  FDA approved for NASAL specimens only), is one component of a comprehensive MRSA colonization  surveillance program. It is not intended to diagnose MRSA infection nor to guide or monitor treatment for MRSA infections. Test performance is not FDA approved in patients less than 71 years old. Performed at [facility], [place] [address] [place], [place] [address]      Labs: Basic Metabolic Panel: Recent Labs  Lab 09/26/20 1210 09/26/20 1506 09/26/20 1808 09/27/20 0140 09/27/20 1157 09/28/20 0159 09/29/20 0808 09/30/20 0315  NA 136 137  --  138  --  135 135 137  K 4.6 3.8  --  3.5 3.3* 3.2* 3.5 3.6  CL 96*  --   --  102  --  103 102 102  CO2 13*  --   --  22  --  '23 24 23  '$ GLUCOSE 109*  --   --  95  --  115* 96 97  BUN 18  --   --  10  --  <5* 5* 10  CREATININE 0.82  --  0.65 0.71  --  0.63 0.74 0.71  CALCIUM 9.1  --   --  7.7*  --  7.7* 8.4* 9.1  MG  --   --   --  1.8  --   --  2.3 2.1  PHOS  --   --   --  1.9* 2.5  --   --   --    Liver Function Tests: Recent Labs  Lab 09/26/20 1210 09/27/20 0140  AST 157* 82*  ALT 29 21  ALKPHOS 83 63  BILITOT 1.3* 1.6*  PROT 7.9 6.2*  ALBUMIN 4.7 3.7   Recent Labs  Lab 09/26/20 1210  LIPASE 34   Recent Labs  Lab 09/26/20 1444  AMMONIA 16   CBC: Recent Labs  Lab 09/26/20 1210 09/26/20 1506 09/26/20 1808 09/27/20 0140 09/28/20 0159 09/29/20 0808 09/30/20 0315  WBC 15.9*  --  11.7* 8.5 6.6 5.7 5.4  NEUTROABS 14.4*  --   --   --   --   --   --   HGB 14.0   < > 11.6* 11.9* 12.5 12.4 11.9*  HCT 41.3   < > 33.5* 34.2* 36.8 36.3 35.1*  MCV 98.3  --  97.4 98.3 98.4 98.4 98.6  PLT 163  --  128* 129* 122* 127* 133*   < > = values in this interval not displayed.   Cardiac Enzymes: No results for input(s): CKTOTAL, CKMB, CKMBINDEX, TROPONINI in the last 168 hours. BNP: BNP (last 3 results) No results for input(s): BNP in the last 8760 hours.  ProBNP (last 3 results) No results for input(s): PROBNP in the last 8760 hours.  CBG: No results for input(s): GLUCAP in the last 168  hours.     Signed:  Nita Sells MD   [facility] 09/30/2020, 2:18 PM

## 2020-09-30 NOTE — Progress Notes (Signed)
Physical Therapy Treatment Patient Details Name: Terri Ayala MRN: [id number] DOB: Jul 09, 1955 Today's Date: 09/30/2020    History of Present Illness Pt is a 65 y.o. F who presents with alcohol withdrawal sympatoms and acute liver injury with SIRS. Admitted to ICU and started on Precedex. Significant PMH: alcohol abuse, chronic LBP, depression, dysphagia    PT Comments    Pt making good progress towards her physical therapy goals, demonstrating improved activity tolerance, ambulation distance and balance today in comparison to yesterday. Pt ambulating x 150 feet with a walker and negotiated 5 steps with a right railing without physical difficulty. Also practiced ambulating short room distances with no AD and holding onto bag to simulate grocery bag. Recommended use of walker for longer distances, cane for shorter distances to begin with.     Follow Up Recommendations  Home health PT;Supervision for mobility/OOB     Equipment Recommendations  Rolling walker with 5" wheels    Recommendations for Other Services       Precautions / Restrictions Precautions Precautions: Fall Restrictions Weight Bearing Restrictions: No    Mobility  Bed Mobility Overal bed mobility: Modified Independent                  Transfers Overall transfer level: Needs assistance Equipment used: Rolling walker (2 wheeled) Transfers: Sit to/from Stand Sit to Stand: Supervision         General transfer comment: Decreased eccentric control with transition to sitting  Ambulation/Gait Ambulation/Gait assistance: Supervision Gait Distance (Feet): 150 Feet Assistive device: Rolling walker (2 wheeled);None Gait Pattern/deviations: Step-through pattern;Decreased stride length Gait velocity: decreased   General Gait Details: supervision for safety, cues for upright posture, no gross imbalance noted. practiced with no AD short distances, in addition to holding onto bag to simulate grocery bag, pt  seeking intermittent external support with opposite hand   Stairs Stairs: Yes Stairs assistance: Supervision Stair Management: One rail Right Number of Stairs: 5 General stair comments: step by step pattern   Wheelchair Mobility    Modified Rankin (Stroke Patients Only)       Balance Overall balance assessment: Needs assistance Sitting-balance support: Feet supported Sitting balance-Leahy Scale: Good     Standing balance support: No upper extremity supported;During functional activity Standing balance-Leahy Scale: Fair                              Cognition Arousal/Alertness: Awake/alert Behavior During Therapy: WFL for tasks assessed/performed Overall Cognitive Status: Within Functional Limits for tasks assessed                                        Exercises      General Comments        Pertinent Vitals/Pain Pain Assessment: Faces Faces Pain Scale: Hurts a little bit Pain Location: bilateral ankles Pain Descriptors / Indicators: Aching;Tingling Pain Intervention(s): Monitored during session    Home Living                      Prior Function            PT Goals (current goals can now be found in the care plan section) Acute Rehab PT Goals Patient Stated Goal: go home by Friday PT Goal Formulation: With patient Time For Goal Achievement: 10/13/20 Potential to Achieve Goals: Good Progress towards PT  goals: Progressing toward goals    Frequency    Min 3X/week      PT Plan Current plan remains appropriate    Co-evaluation              AM-PAC PT "6 Clicks" Mobility   Outcome Measure  Help needed turning from your back to your side while in a flat bed without using bedrails?: None Help needed moving from lying on your back to sitting on the side of a flat bed without using bedrails?: None Help needed moving to and from a bed to a chair (including a wheelchair)?: A Little Help needed standing up  from a chair using your arms (e.g., wheelchair or bedside chair)?: A Little Help needed to walk in hospital room?: A Little Help needed climbing 3-5 steps with a railing? : A Little 6 Click Score: 20    End of Session Equipment Utilized During Treatment: Gait belt Activity Tolerance: Patient tolerated treatment well Patient left: with call bell/phone within reach;in bed;with bed alarm set Nurse Communication: Mobility status PT Visit Diagnosis: Unsteadiness on feet (R26.81);Muscle weakness (generalized) (M62.81);Difficulty in walking, not elsewhere classified (R26.2)     Time: [id number] PT Time Calculation (min) (ACUTE ONLY): 19 min  Charges:  $Gait Training: 8-22 mins                     Wyona Almas, PT, DPT Acute Rehabilitation Services Pager [phone] Office [phone]    Deno Etienne 09/30/2020, 11:27 AM

## 2020-09-30 NOTE — Hospital Course (Signed)
15 white female-multiple episodes admission ethanolism including [facility] admission 2015-multiple hospitalization 2019-->2021, still drinks 2 bottles of wine per day SVT HLD, comorbid depression  Admit CCM 09/26/2020 sinus tachycardia lactic acidosis of 10 severe hypotension (does have underlying hypertension)-found to have acute liver injury on admission slightly elevated bilirubin 1.6 AST 82 WBC on admission 15 Bicarb 13 BUN/creatinine 18/0.8, lipase 157 beta hydroxybutyrate 3.5  Transferred to hospitalist service 8/1  Low-grade temps 99.6      Prior to admission, pt lives alone and is independent. Pt presents with decreased functional mobility secondary to balance deficits, decreased activity tolerance, and functional weakness. Pt ambulating ~20 ft to bathroom and back with a walker and min-mod assist. Suspect good progress given expected course and continued medical management of alcohol withdrawal symptoms. Will continue to follow for gait and balance training.

## 2020-09-30 NOTE — Progress Notes (Signed)
All belongings returned to patient, LDAs removed, AVS printed and supplied to patient. Reviewed AVS and all questions answered. Patient waiting comfortably in bed for [facility] to transport to home, call bell within reach.  Candy Sledge, RN

## 2020-09-30 NOTE — Care Management (Addendum)
Called Freda Munro with [facility] for rolling walker. [employer] will call patient regarding walker.   Cory with [facility] reviewing referral for HHPT . Cory with [facility] accepted

## 2020-09-30 NOTE — TOC Transition Note (Signed)
Transition of Care [facility]) - CM/SW Discharge Note   Patient Details  Name: Terri Ayala MRN: [id number] Date of Birth: March 08, 1955  Transition of Care [facility]) CM/SW Contact:  Benard Halsted, LCSW Phone Number: 09/30/2020, 3:11 PM   Clinical Narrative:    CSW arranged for [facility] to pick patient up. Medical Necessity form placed on hard chart.    Final next level of care: [place] Barriers to Discharge: Barriers Resolved   Patient Goals and CMS Choice Patient states their goals for this hospitalization and ongoing recovery are:: return home and be able to walk up stairs CMS Medicare.gov Compare Post Acute Care list provided to:: Patient Choice offered to / list presented to : Patient  Discharge Placement                Patient to be transferred to facility by: PTAR   Patient and family notified of of transfer: 09/30/20  Discharge Plan and Services In-house Referral: Clinical Social Work Discharge Planning Services: CM Consult Post Acute Care Choice: Home Health, Durable Medical Equipment                    HH Arranged: PT, OT          Social Determinants of Health (SDOH) Interventions     Readmission Risk Interventions No flowsheet data found.

## 2020-10-01 LAB — CULTURE, BLOOD (ROUTINE X 2)
Culture: NO GROWTH
Culture: NO GROWTH
Special Requests: ADEQUATE
Special Requests: ADEQUATE

## 2020-10-01 MED ORDER — BUPIVACAINE HCL (PF) 0.25 % IJ SOLN
INTRAMUSCULAR | Status: AC
  Filled 2020-10-01: qty 30

## 2020-10-01 MED ORDER — HEPARIN (PORCINE) IN NACL 1000-0.9 UT/500ML-% IV SOLN
INTRAVENOUS | Status: AC
  Filled 2020-10-01: qty 500

## 2020-10-01 MED ORDER — HEPARIN SODIUM (PORCINE) 1000 UNIT/ML IJ SOLN
INTRAMUSCULAR | Status: AC
  Filled 2020-10-01: qty 1

## 2020-10-01 MED ORDER — ISOPROTERENOL HCL 0.2 MG/ML IJ SOLN
INTRAMUSCULAR | Status: AC
  Filled 2020-10-01: qty 5

## 2020-10-14 ENCOUNTER — Telehealth: Payer: Self-pay | Admitting: Physician Assistant

## 2020-10-14 NOTE — Telephone Encounter (Signed)
Patient was released from hospital.  In discharge summary it states Terri Ayala needs to have labs done in 1 week.  She is on the lab schedule for 10/19/20 @ 9am.  Will you please add the below orders?  I copied and pasted them from the discharge summary.    Requires Chem-12 CBC in about 1 week

## 2020-10-19 ENCOUNTER — Ambulatory Visit (INDEPENDENT_AMBULATORY_CARE_PROVIDER_SITE_OTHER): Payer: Medicare Other | Admitting: Nurse Practitioner

## 2020-10-19 ENCOUNTER — Other Ambulatory Visit: Payer: Medicare Other

## 2020-10-19 ENCOUNTER — Other Ambulatory Visit: Payer: Self-pay

## 2020-10-19 ENCOUNTER — Encounter: Payer: Self-pay | Admitting: Nurse Practitioner

## 2020-10-19 VITALS — BP 124/78 | HR 78 | Temp 97.4°F | Ht 62.0 in | Wt 145.0 lb

## 2020-10-19 DIAGNOSIS — F10230 Alcohol dependence with withdrawal, uncomplicated: Secondary | ICD-10-CM

## 2020-10-19 DIAGNOSIS — Z09 Encounter for follow-up examination after completed treatment for conditions other than malignant neoplasm: Secondary | ICD-10-CM

## 2020-10-19 DIAGNOSIS — F10930 Alcohol use, unspecified with withdrawal, uncomplicated: Secondary | ICD-10-CM

## 2020-10-19 DIAGNOSIS — I10 Essential (primary) hypertension: Secondary | ICD-10-CM

## 2020-10-19 DIAGNOSIS — R0602 Shortness of breath: Secondary | ICD-10-CM

## 2020-10-19 DIAGNOSIS — E7800 Pure hypercholesterolemia, unspecified: Secondary | ICD-10-CM | POA: Diagnosis not present

## 2020-10-19 DIAGNOSIS — R600 Localized edema: Secondary | ICD-10-CM | POA: Insufficient documentation

## 2020-10-19 MED ORDER — PROPRANOLOL HCL 10 MG PO TABS: 10.0000 mg | ORAL_TABLET | Freq: Two times a day (BID) | ORAL | 2 refills | Status: DC

## 2020-10-19 MED ORDER — ALBUTEROL SULFATE HFA 108 (90 BASE) MCG/ACT IN AERS: 2.0000 | INHALATION_SPRAY | Freq: Four times a day (QID) | RESPIRATORY_TRACT | 2 refills | Status: DC | PRN

## 2020-10-19 NOTE — Progress Notes (Signed)
Established Patient Office Visit  Subjective:  Patient ID: Terri Ayala, female    DOB: Jul 06, 1955  Age: 65 y.o. MRN: [id number]  CC:  Chief Complaint  Patient presents with   Hospitalization Follow-up    HPI ALASIA Ayala presents for hospital follow-up.  The patient was after she had from 09/26/2020 to 09/30/2020.  She was hospitalized after a relapse with alcoholism.  States she has been sober since December 2021.  She states she went to stay with her mom for a few months while her mom to rehab from a fractured femur.  The patient was with her mom, she did not have access to [place] or [facility] mental health services.  She states that after little while, the situation just got to her and she began drinking again.  She states that when she finally got home she tried to stop drinking.  Went through some withdrawal.  Due to withdrawal symptoms, had to call 911.  She was experiencing chest pain, shortness of breath, tremor, and sweats.  She went through [facility] protocols.  Upon discharge was given prescription for 18 days of gabapentin.  She was given instructions to taper off this medication.  She states gabapentin has really helped her transition from withdrawals to sobriety.  She states she has 2 days left of gabapentin.  She has noticed some swelling of her ankles and feet since discharge from hospital.  Discussed that this is a side effect of gabapentin that she continues to wean off and stay off the medication, edema should improve.  During the patient's hospitalization she did have abnormal potassium level and elevated liver functions.  She should have these levels rechecked today.  The patient also mentions she would like to have a recheck of cholesterol panel.  She states her last check showed very high total cholesterol and LDL levels.  She states she is fasting today and would like to recheck.  Patient states she been doing well since her discharge from the hospital.  She is attending [place] meetings and sees  therapist through [facility].  She has no physical concerns or complaints today.  Past Medical History:  Diagnosis Date   Alcohol withdrawal ([place]) 11/2017; 01/04/2018   Alcoholism ([place])    Anxiety    Arthritis    "hands" (01/04/2018)   ASYMPTOMATIC POSTMENOPAUSAL STATUS 08/17/2009   Chronic lower back pain    Depression    DYSPHAGIA 06/19/2007   Fatty liver, alcoholic    GERD (gastroesophageal reflux disease)    GOITER, MULTINODULAR 08/17/2009   Hyperlipidemia    Migraines    "not sure what triggers them; I'll have 1 q couple weeks or more; come 2 days in a row when they come" (01/04/2018)   Mitral valve prolapse    OSTEOPOROSIS 08/17/2009   PONV (postoperative nausea and vomiting)    Supraventricular tachycardia ([place])     Past Surgical History:  Procedure Laterality Date   BREAST BIOPSY Right    "benign"   CATARACT EXTRACTION W/ INTRAOCULAR LENS  IMPLANT, BILATERAL     CERVICAL CONE BIOPSY  2000s   CERVIX LESION DESTRUCTION  1991   "dysplasia; lesions"   CHOLECYSTECTOMY N/A 03/14/2013   Procedure: LAPAROSCOPIC CHOLECYSTECTOMY WITH ATTEMPTED INTRAOPERATIVE CHOLANGIOGRAM;  Surgeon: Harl Bowie, MD;  Location: [facility];  Service: General;  Laterality: N/A;   laporoscopic abdominal surgery     "for endometriosis"   LEFT HEART CATH AND CORONARY ANGIOGRAPHY N/A 05/07/2018   Procedure: LEFT HEART  CATH AND CORONARY ANGIOGRAPHY;  Surgeon: Belva Crome, MD;  Location: [facility];  Service: Cardiovascular;  Laterality: N/A;    Family History  Problem Relation Age of Onset   Colon cancer Father        Deceased, 22   Osteoporosis Mother        Living, 74   Healthy Brother    Healthy Brother    Healthy Son    Healthy Son    Thyroid disease Neg Hx    Goiter Neg Hx     Social History   Socioeconomic History   Marital status: Single    Spouse name: Not on file   Number of children: Not on file   Years of education: Not on file   Highest education  level: Not on file  Occupational History   Occupation: Brewing technologist - retired    Fish farm manager: [employer]  Tobacco Use   Smoking status: Former    Packs/day: 1.00    Years: 7.00    Pack years: 7.00    Types: Cigarettes    Quit date: 02/28/1974    Years since quitting: 46.6   Smokeless tobacco: Never   Tobacco comments:    01/04/2018 "smoked when I was a teenager"  Vaping Use   Vaping Use: Never used  Substance and Sexual Activity   Alcohol use: Yes    Alcohol/week: 28.0 standard drinks    Types: 28 Glasses of wine per week    Comment: 01/04/2018 "1 1/2 bottles of wine/day"   Drug use: Never   Sexual activity: Not Currently  Other Topics Concern   Not on file  Social History Narrative   Lives alone.  She has a BS biology.   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   Social Determinants of Health   Financial Resource Strain: Not on file  Food Insecurity: Not on file  Transportation Needs: Not on file  Physical Activity: Not on file  Stress: Not on file  Social Connections: Not on file  Intimate Partner Violence: Not on file    Outpatient Medications Prior to Visit  Medication Sig Dispense Refill   acetaminophen (TYLENOL) 325 MG tablet Take 2 tablets (650 mg total) by mouth every 6 (six) hours as needed for moderate pain.     albuterol (VENTOLIN HFA) 108 (90 Base) MCG/ACT inhaler INHALE 2 PUFFS INTO THE LUNGS EVERY 6 HOURS AS NEEDED FOR WHEEZING OR SHORTNESS OF BREATH. 8.5 g 0   alendronate (FOSAMAX) 70 MG tablet Take 70 mg by mouth every Monday. Take with a full glass of water on an empty stomach.     busPIRone (BUSPAR) 15 MG tablet Take 15 mg by mouth 3 (three) times daily.     DULoxetine (CYMBALTA) 60 MG capsule TAKE 1 CAPSULE (60 MG TOTAL) BY MOUTH DAILY. 30 capsule 0   gabapentin (NEURONTIN) 100 MG capsule Take 1 capsule (100 mg total) by mouth 3 (three) times daily for 6 days, THEN 1 capsule (100 mg total) 2 (two) times daily for 6 days,  THEN 1 capsule (100 mg total) at bedtime for 6 days. 36 capsule 0   hydrOXYzine (ATARAX/VISTARIL) 25 MG tablet Take 25 mg by mouth 3 (three) times daily.     Multiple Vitamin (MULTIVITAMIN WITH MINERALS) TABS tablet Take 1 tablet by mouth daily. 30 tablet 0   norethindrone-ethinyl estradiol (FEMHRT 1/5) 1-5 MG-MCG TABS Take 1 tablet by mouth daily. For Osteoporosis 28 tablet  pantoprazole (PROTONIX) 40 MG tablet TAKE 1 TABLET BY MOUTH 2 TIMES DAILY. 180 tablet 1   propranolol (INDERAL) 10 MG tablet Take 1 tablet (10 mg total) by mouth 2 (two) times daily. 60 tablet 0   SUMAtriptan (IMITREX) 100 MG tablet TAKE 1 TABLET BY MOUTH AT THE FIRST SIGN OF MIGRAINE, MAY REPEAT IN 2 HOURS. MAX 2 TABLETS IN 24 HOURS 10 tablet 0   traZODone (DESYREL) 50 MG tablet Take 50 mg by mouth at bedtime.     No facility-administered medications prior to visit.    Allergies  Allergen Reactions   Doxycycline Swelling    Mouth swelling and sores   Codeine Itching   Tetracycline Hcl Swelling    Mouth swelling and sores    ROS Review of Systems  Constitutional:  Negative for activity change, appetite change, chills, fatigue and fever.  HENT:  Negative for congestion, postnasal drip, rhinorrhea, sinus pressure, sinus pain and sore throat.   Eyes: Negative.   Respiratory:  Negative for cough, choking, chest tightness and shortness of breath.   Cardiovascular:  Negative for palpitations.       She has noticed some ankle and pedal edema since discharge from the hospital.  Gastrointestinal:  Negative for constipation, diarrhea, nausea and vomiting.  Endocrine: Negative for cold intolerance, heat intolerance, polydipsia and polyuria.  Musculoskeletal:  Negative for arthralgias and back pain.  Skin:  Negative for rash.  Allergic/Immunologic: Negative.   Neurological:  Negative for dizziness, weakness and headaches.  Psychiatric/Behavioral:  Negative for dysphoric mood and sleep disturbance. The patient is  nervous/anxious.      Objective:    Physical Exam Vitals and nursing note reviewed.  Constitutional:      Appearance: Normal appearance. She is well-developed. She is obese.  HENT:     Head: Normocephalic and atraumatic.     Nose: Nose normal.     Mouth/Throat:     Mouth: Mucous membranes are moist.  Eyes:     Extraocular Movements: Extraocular movements intact.     Conjunctiva/sclera: Conjunctivae normal.     Pupils: Pupils are equal, round, and reactive to light.  Neck:     Vascular: No carotid bruit.  Cardiovascular:     Rate and Rhythm: Normal rate and regular rhythm.     Pulses: Normal pulses.     Heart sounds: Normal heart sounds.  Pulmonary:     Effort: Pulmonary effort is normal.     Breath sounds: Normal breath sounds.  Abdominal:     General: Bowel sounds are normal.     Palpations: Abdomen is soft.     Tenderness: There is no abdominal tenderness. There is no guarding.  Musculoskeletal:        General: Normal range of motion.     Cervical back: Normal range of motion and neck supple.  Lymphadenopathy:     Cervical: No cervical adenopathy.  Skin:    General: Skin is warm and dry.     Capillary Refill: Capillary refill takes less than 2 seconds.  Neurological:     General: No focal deficit present.     Mental Status: She is alert and oriented to person, place, and time.  Psychiatric:        Mood and Affect: Mood normal.        Behavior: Behavior normal.        Thought Content: Thought content normal.        Judgment: Judgment normal.    Today's Vitals  10/19/20 0912  BP: 124/78  Pulse: 78  Temp: (!) 97.4 F (36.3 C)  SpO2: 100%  Weight: 145 lb (65.8 kg)  Height: '5\' 2"'$  (1.575 m)   Body mass index is 26.52 kg/m.   Wt Readings from Last 3 Encounters:  10/19/20 145 lb (65.8 kg)  09/30/20 131 lb 6.3 oz (59.6 kg)  04/15/20 130 lb (59 kg)     Health Maintenance Due  Topic Date Due   MAMMOGRAM  02/28/2019   INFLUENZA VACCINE  09/28/2020     There are no preventive care reminders to display for this patient.  Lab Results  Component Value Date   TSH 1.810 03/27/2020   Lab Results  Component Value Date   WBC 5.4 09/30/2020   HGB 11.9 (L) 09/30/2020   HCT 35.1 (L) 09/30/2020   MCV 98.6 09/30/2020   PLT 133 (L) 09/30/2020   Lab Results  Component Value Date   NA 137 09/30/2020   K 3.6 09/30/2020   CO2 23 09/30/2020   GLUCOSE 97 09/30/2020   BUN 10 09/30/2020   CREATININE 0.71 09/30/2020   BILITOT 1.6 (H) 09/27/2020   ALKPHOS 63 09/27/2020   AST 82 (H) 09/27/2020   ALT 21 09/27/2020   PROT 6.2 (L) 09/27/2020   ALBUMIN 3.7 09/27/2020   CALCIUM 9.1 09/30/2020   ANIONGAP 12 09/30/2020   Lab Results  Component Value Date   CHOL 309 (H) 01/10/2020   Lab Results  Component Value Date   HDL 67 01/10/2020   Lab Results  Component Value Date   LDLCALC 202 (H) 01/10/2020   Lab Results  Component Value Date   TRIG 209 (H) 01/10/2020   Lab Results  Component Value Date   CHOLHDL 4.6 (H) 01/10/2020   Lab Results  Component Value Date   HGBA1C 4.7 (L) 01/10/2020      Assessment & Plan:  1. Hospital discharge follow-up Appointment today for hospital follow-up.  Patient was hospitalized from 09/26/2020 through 09/30/2020.  She was hospitalized for acute alcohol withdrawal and acute liver injury.  We reviewed his hospital course and lab work.  We will recheck her, CMP, and fasting lipid panel today.  2. Alcohol withdrawal syndrome without complication [facility]) Patient is back to attending [place] meetings and meeting with her therapist from [facility].  She was discharged from the hospital on gabapentin with instructions to taper down over the next 18 days.  She states this made a positive transition from hospitalization to sobriety outside of the hospital.  She states she is doing well.  We will repeat her CBC and CMP today - Comprehensive metabolic panel - CBC  3. Pedal edema This is likely a  side effect of gabapentin.  This was discussed with the patient.  Should gradually resolve after patient completes.  Will monitor.  4. Elevated cholesterol Most recent lipid panel very elevated.  Patient fasting today.  We will check fasting lipid panel and treat as indicated. - Lipid panel  5. Essential hypertension Stable.  Continue blood pressure medications as prescribed.  Problem List Items Addressed This Visit       Cardiovascular and Mediastinum   Essential hypertension     Nervous and Auditory   Alcohol withdrawal ([place])   Relevant Orders   Comprehensive metabolic panel   CBC     Other   Hospital discharge follow-up - Primary   Elevated cholesterol   Relevant Orders   Lipid panel   Pedal edema  This note was dictated using Systems analyst. Rapid proofreading was performed to expedite the delivery of the information. Despite proofreading, phonetic errors will occur which are common with this voice recognition software. Please take this into consideration. If there are any concerns, please contact our office.     Follow-up: Return in about 3 months (around 01/19/2021) for HTN, Alcohol withdrawl .    Ronnell Freshwater, NP

## 2020-10-19 NOTE — Addendum Note (Signed)
Addended by: Leretha Pol on: 10/19/2020 10:03 AM   Modules accepted: Orders

## 2020-10-19 NOTE — Patient Instructions (Signed)
Alcohol Withdrawal Syndrome When a person who drinks a lot of alcohol stops drinking, he or she may have unpleasant and serious symptoms. These symptoms are called alcohol withdrawal syndrome. This condition may be mild or severe. It can be life-threatening. This condition can cause: Shaking that you cannot control (tremor). Sweating. Headache. Feeling fearful, upset, grouchy, or depressed. Trouble sleeping (insomnia). Nightmares. Fast or uneven heartbeats (palpitations). Alcohol cravings. Feeling sick to your stomach (nausea). Throwing up (vomiting). Being bothered by light and sounds. Confusion. Trouble thinking clearly. Not being hungry (loss of appetite). Big changes in mood (mood swings). If you have all of the following symptoms at the same time, get help right away: High blood pressure. Fast heartbeat. Trouble breathing. Seizures. Seeing, hearing, feeling, smelling, or tasting things that are not there (hallucinations). These symptoms are known as delirium tremens (DTs). They must be treated at [facility] right away. Follow these instructions at home:  Take over-the-counter and prescription medicines only as told by your doctor. This includes vitamins. Do not drink alcohol. Do not drive until your doctor says that this is safe for you. Have someone stay with you or be available in case you need help. This should be someone you trust. This person can help you with your symptoms. He or she can also help you to not drink. Drink enough fluid to keep your pee (urine) pale yellow. Think about joining a support group or a treatment program to help you stop drinking. Keep all follow-up visits as told by your doctor. This is important. Contact a doctor if: Your symptoms get worse. You cannot eat or drink without throwing up. You have a hard time not drinking alcohol. You cannot stop drinking alcohol. Get help right away if: You have fast or uneven heartbeats. You have chest  pain. You have trouble breathing. You have a seizure for the first time. You see, hear, feel, smell, or taste something that is not there. You get very confused. Summary When a person who drinks a lot of alcohol stops drinking, he or she may have serious symptoms. This is called alcohol withdrawal syndrome. Delirium tremens (DTs) is a group of life-threatening symptoms. You should get help right away if you have these symptoms. Think about joining an alcohol support group or a treatment program. This information is not intended to replace advice given to you by your health care provider. Make sure you discuss any questions you have with your healthcare provider. Document Revised: 01/06/2020 Document Reviewed: 01/06/2020 Elsevier Patient Education  2022 [employer].

## 2020-10-20 LAB — COMPREHENSIVE METABOLIC PANEL
ALT: 15 IU/L (ref 0–32)
AST: 37 IU/L (ref 0–40)
Albumin/Globulin Ratio: 1.7 (ref 1.2–2.2)
Albumin: 4 g/dL (ref 3.8–4.8)
Alkaline Phosphatase: 61 IU/L (ref 44–121)
BUN/Creatinine Ratio: 9 — ABNORMAL LOW (ref 12–28)
BUN: 7 mg/dL — ABNORMAL LOW (ref 8–27)
Bilirubin Total: 0.3 mg/dL (ref 0.0–1.2)
CO2: 21 mmol/L (ref 20–29)
Calcium: 9 mg/dL (ref 8.7–10.3)
Chloride: 103 mmol/L (ref 96–106)
Creatinine, Ser: 0.81 mg/dL (ref 0.57–1.00)
Globulin, Total: 2.4 g/dL (ref 1.5–4.5)
Glucose: 87 mg/dL (ref 65–99)
Potassium: 4.2 mmol/L (ref 3.5–5.2)
Sodium: 140 mmol/L (ref 134–144)
Total Protein: 6.4 g/dL (ref 6.0–8.5)
eGFR: 80 mL/min/{1.73_m2} (ref >59)

## 2020-10-20 LAB — CBC
Hematocrit: 36.3 % (ref 34.0–46.6)
Hemoglobin: 12.2 g/dL (ref 11.1–15.9)
MCH: 32 pg (ref 26.6–33.0)
MCHC: 33.6 g/dL (ref 31.5–35.7)
MCV: 95 fL (ref 79–97)
Platelets: 316 10*3/uL (ref 150–450)
RBC: 3.81 x10E6/uL (ref 3.77–5.28)
RDW: 11.9 % (ref 11.7–15.4)
WBC: 8.4 10*3/uL (ref 3.4–10.8)

## 2020-10-20 LAB — LIPID PANEL
Chol/HDL Ratio: 4.1 ratio (ref 0.0–4.4)
Cholesterol, Total: 199 mg/dL (ref 100–199)
HDL: 48 mg/dL (ref >39)
LDL Chol Calc (NIH): 139 mg/dL — ABNORMAL HIGH (ref 0–99)
Triglycerides: 68 mg/dL (ref 0–149)
VLDL Cholesterol Cal: 12 mg/dL (ref 5–40)

## 2020-10-20 NOTE — Progress Notes (Signed)
Please let the patient know that new check of patient's labs looks good. Liver functions are normal as are electrolytes. Anemia has corrected itself. Her LDL cholesterol was just a little elevated, but her total cholesterol and triglycerides were good. She should conitnue to eat a low fat, low cholesterol diet and participate in low impact exercises when possible. We will recheck cholesterol in one year and other labs as needed. Thanks.   -HB

## 2020-11-20 ENCOUNTER — Emergency Department (HOSPITAL_COMMUNITY): Payer: Medicare Other

## 2020-11-20 ENCOUNTER — Encounter (HOSPITAL_COMMUNITY): Payer: Self-pay | Admitting: Emergency Medicine

## 2020-11-20 ENCOUNTER — Other Ambulatory Visit: Payer: Self-pay

## 2020-11-20 ENCOUNTER — Inpatient Hospital Stay (HOSPITAL_COMMUNITY)
Admission: EM | Admit: 2020-11-20 | Discharge: 2020-11-23 | DRG: 897 | Disposition: A | Discharge status: Home / Self Care | Payer: Medicare Other | Attending: Internal Medicine | Admitting: Internal Medicine

## 2020-11-20 DIAGNOSIS — E785 Hyperlipidemia, unspecified: Secondary | ICD-10-CM | POA: Diagnosis present

## 2020-11-20 DIAGNOSIS — K700 Alcoholic fatty liver: Secondary | ICD-10-CM | POA: Diagnosis present

## 2020-11-20 DIAGNOSIS — F10239 Alcohol dependence with withdrawal, unspecified: Secondary | ICD-10-CM | POA: Diagnosis not present

## 2020-11-20 DIAGNOSIS — K709 Alcoholic liver disease, unspecified: Secondary | ICD-10-CM | POA: Diagnosis present

## 2020-11-20 DIAGNOSIS — Z87891 Personal history of nicotine dependence: Secondary | ICD-10-CM

## 2020-11-20 DIAGNOSIS — K219 Gastro-esophageal reflux disease without esophagitis: Secondary | ICD-10-CM | POA: Diagnosis present

## 2020-11-20 DIAGNOSIS — Z79899 Other long term (current) drug therapy: Secondary | ICD-10-CM

## 2020-11-20 DIAGNOSIS — F10939 Alcohol use, unspecified with withdrawal, unspecified: Secondary | ICD-10-CM | POA: Diagnosis present

## 2020-11-20 DIAGNOSIS — Z7983 Long term (current) use of bisphosphonates: Secondary | ICD-10-CM

## 2020-11-20 DIAGNOSIS — E1310 Other specified diabetes mellitus with ketoacidosis without coma: Secondary | ICD-10-CM

## 2020-11-20 DIAGNOSIS — F411 Generalized anxiety disorder: Secondary | ICD-10-CM | POA: Diagnosis present

## 2020-11-20 DIAGNOSIS — F331 Major depressive disorder, recurrent, moderate: Secondary | ICD-10-CM | POA: Diagnosis present

## 2020-11-20 DIAGNOSIS — M810 Age-related osteoporosis without current pathological fracture: Secondary | ICD-10-CM | POA: Diagnosis present

## 2020-11-20 DIAGNOSIS — E538 Deficiency of other specified B group vitamins: Secondary | ICD-10-CM | POA: Diagnosis present

## 2020-11-20 DIAGNOSIS — F1010 Alcohol abuse, uncomplicated: Secondary | ICD-10-CM

## 2020-11-20 DIAGNOSIS — Z800 Family history of malignant neoplasm of digestive organs: Secondary | ICD-10-CM

## 2020-11-20 DIAGNOSIS — F1020 Alcohol dependence, uncomplicated: Secondary | ICD-10-CM | POA: Diagnosis present

## 2020-11-20 DIAGNOSIS — G894 Chronic pain syndrome: Secondary | ICD-10-CM | POA: Diagnosis present

## 2020-11-20 LAB — COMPREHENSIVE METABOLIC PANEL
ALT: 18 U/L (ref 0–44)
AST: 51 U/L — ABNORMAL HIGH (ref 15–41)
Albumin: 4.4 g/dL (ref 3.5–5.0)
Alkaline Phosphatase: 59 U/L (ref 38–126)
Anion gap: 28 — ABNORMAL HIGH (ref 5–15)
BUN: 11 mg/dL (ref 8–23)
CO2: 12 mmol/L — ABNORMAL LOW (ref 22–32)
Calcium: 9.5 mg/dL (ref 8.9–10.3)
Chloride: 98 mmol/L (ref 98–111)
Creatinine, Ser: 1.07 mg/dL — ABNORMAL HIGH (ref 0.44–1.00)
GFR, Estimated: 57 mL/min — ABNORMAL LOW (ref >60)
Glucose, Bld: 225 mg/dL — ABNORMAL HIGH (ref 70–99)
Potassium: 4 mmol/L (ref 3.5–5.1)
Sodium: 138 mmol/L (ref 135–145)
Total Bilirubin: 1.5 mg/dL — ABNORMAL HIGH (ref 0.3–1.2)
Total Protein: 8.1 g/dL (ref 6.5–8.1)

## 2020-11-20 LAB — CBC WITH DIFFERENTIAL/PLATELET
Abs Immature Granulocytes: 0.07 10*3/uL (ref 0.00–0.07)
Basophils Absolute: 0.1 10*3/uL (ref 0.0–0.1)
Basophils Relative: 1 %
Eosinophils Absolute: 0 10*3/uL (ref 0.0–0.5)
Eosinophils Relative: 0 %
HCT: 44.7 % (ref 36.0–46.0)
Hemoglobin: 14.7 g/dL (ref 12.0–15.0)
Immature Granulocytes: 0 %
Lymphocytes Relative: 6 %
Lymphs Abs: 0.9 10*3/uL (ref 0.7–4.0)
MCH: 32.5 pg (ref 26.0–34.0)
MCHC: 32.9 g/dL (ref 30.0–36.0)
MCV: 98.7 fL (ref 80.0–100.0)
Monocytes Absolute: 0.6 10*3/uL (ref 0.1–1.0)
Monocytes Relative: 4 %
Neutro Abs: 13.8 10*3/uL — ABNORMAL HIGH (ref 1.7–7.7)
Neutrophils Relative %: 89 %
Platelets: 373 10*3/uL (ref 150–400)
RBC: 4.53 MIL/uL (ref 3.87–5.11)
RDW: 14.4 % (ref 11.5–15.5)
WBC: 15.6 10*3/uL — ABNORMAL HIGH (ref 4.0–10.5)
nRBC: 0 % (ref 0.0–0.2)

## 2020-11-20 LAB — PROTIME-INR
INR: 1 (ref 0.8–1.2)
Prothrombin Time: 13.1 seconds (ref 11.4–15.2)

## 2020-11-20 LAB — MAGNESIUM: Magnesium: 2.1 mg/dL (ref 1.7–2.4)

## 2020-11-20 LAB — ETHANOL: Alcohol, Ethyl (B): <10 mg/dL (ref <10)

## 2020-11-20 LAB — LIPASE, BLOOD: Lipase: 47 U/L (ref 11–51)

## 2020-11-20 MED ORDER — LORAZEPAM 2 MG/ML IJ SOLN
2.0000 mg | Freq: Once | INTRAMUSCULAR | Status: AC
  Administered 2020-11-20: 2 mg via INTRAVENOUS
  Filled 2020-11-20: qty 1

## 2020-11-20 MED ORDER — LORAZEPAM 2 MG/ML IJ SOLN
2.0000 mg | Freq: Once | INTRAMUSCULAR | Status: AC
  Administered 2020-11-20: 2 mg via INTRAMUSCULAR

## 2020-11-20 MED ORDER — THIAMINE HCL 100 MG/ML IJ SOLN
100.0000 mg | Freq: Every day | INTRAMUSCULAR | Status: DC
  Administered 2020-11-20: 100 mg via INTRAVENOUS
  Filled 2020-11-20 (×2): qty 2

## 2020-11-20 MED ORDER — LACTATED RINGERS IV BOLUS
1000.0000 mL | Freq: Once | INTRAVENOUS | Status: AC
  Administered 2020-11-20: 1000 mL via INTRAVENOUS

## 2020-11-20 MED ORDER — THIAMINE HCL 100 MG PO TABS
100.0000 mg | ORAL_TABLET | Freq: Every day | ORAL | Status: DC
  Filled 2020-11-20 (×2): qty 1

## 2020-11-20 MED ORDER — LORAZEPAM 2 MG/ML IJ SOLN: 1.0000 mg | Freq: Once | INTRAMUSCULAR | Status: DC

## 2020-11-20 MED ORDER — LORAZEPAM 2 MG/ML IJ SOLN
2.0000 mg | Freq: Once | INTRAMUSCULAR | Status: DC
  Filled 2020-11-20: qty 1

## 2020-11-20 NOTE — ED Triage Notes (Signed)
Per EMS, pt called from home, reports anxiety, pt states she drinks a bottle of wine daily with the last one being today.  ! Epidode of emesi that was w/ EMS.  Pt is hyperventilating in triage.

## 2020-11-20 NOTE — ED Provider Notes (Signed)
[facility] EMERGENCY DEPARTMENT Provider Note   CSN: [id number] Arrival date & time: 11/20/20  2002     History Chief Complaint  Patient presents with   Anxiety   Delirium Tremens (DTS)    Terri Ayala is a 65 y.o. female.  HPI Patient is a 65 year old female with a past medical history significant for alcoholism with severe alcohol withdrawal fatty liver/alcoholic liver disease, hyperlipidemia, depression, anxiety  She is presented to ER today with complaints of chest pain, abdominal pain, fatigue, tremors, shaky arms and legs, heart palpitations, anxiety, nausea  She states that her last drink of alcohol was yesterday evening approximately 24 hours ago.  She states that she has been drinking 1-2 bottles of wine per day she started drinking again 1 month ago.  She was last seen in hospital follow-up after she was discharged home from a admission to the hospital for 4 days for alcohol withdrawal and liver injury.  She was discharged home with gabapentin taper she seemed to feel relatively well after detox.  She denies any leg swelling although she did have some foot swelling a month ago which was thought to be due to the gabapentin  She states her chest pain is sternal nonradiating nonexertional nonpleuritic.  She denies any coughing hemoptysis.  No unilateral or bilateral leg swelling.  No recent surgeries.  No history of VTE.    Past Medical History:  Diagnosis Date   Alcohol withdrawal ([place]) 11/2017; 01/04/2018   Alcoholism ([place])    Anxiety    Arthritis    "hands" (01/04/2018)   ASYMPTOMATIC POSTMENOPAUSAL STATUS 08/17/2009   Chronic lower back pain    Depression    DYSPHAGIA 06/19/2007   Fatty liver, alcoholic    GERD (gastroesophageal reflux disease)    GOITER, MULTINODULAR 08/17/2009   Hyperlipidemia    Migraines    "not sure what triggers them; I'll have 1 q couple weeks or more; come 2 days in a row when they come" (01/04/2018)   Mitral valve  prolapse    OSTEOPOROSIS 08/17/2009   PONV (postoperative nausea and vomiting)    Supraventricular tachycardia ([place])     Patient Active Problem List   Diagnosis Date Noted   Elevated cholesterol 10/19/2020   Pedal edema 10/19/2020   History of colonic polyps 03/25/2020   Hemorrhage of rectum and anus 03/25/2020   Gastroparesis 03/25/2020   Gastroesophageal reflux disease 03/25/2020   Flatulence, eructation and gas pain 03/25/2020   Family history of malignant neoplasm of gastrointestinal tract 73/22/0254   Eosinophilic esophagitis 27/07/2374   Constipation 03/25/2020   Change in bowel habit 03/25/2020   Hospital discharge follow-up 28/31/5176   Metabolic acidosis 16/08/3708   Abnormal liver function 02/06/2019   Alcohol withdrawal delirium ([place]) 02/06/2019   Elevated LFTs 07/12/2018   Healthcare maintenance 06/26/2018   Rib fracture 01/04/2018   Chest pain 12/08/2017   Leukocytosis 12/08/2017   Essential hypertension 12/08/2017   Alcohol withdrawal ([place]) 07/21/2017   Lower urinary tract infectious disease 07/21/2017   Osteopenia 08/27/2016   Alcohol dependence with withdrawal ([place]) 01/27/2016   Generalized anxiety disorder 01/27/2016   Severe episode of recurrent major depressive disorder, without psychotic features ([place]) 01/27/2016   Major depressive disorder, recurrent episode, moderate ([facility])    Alcohol use disorder, severe, dependence ([place]) 04/03/2014   Lumbosacral radiculopathy at L5 05/20/2013   Nonspecific elevation of levels of transaminase or lactic acid dehydrogenase (LDH) 03/16/2013   Hepatic steatosis 03/16/2013   Nausea and vomiting 03/16/2013  Chronic cholecystitis 03/14/2013   Cervical arthritis 10/12/2012   Palpitations 11/29/2010   SVT (supraventricular tachycardia) ([place]) 11/29/2010   Mitral valve prolapse 11/29/2010   Decreased libido 11/15/2010   GOITER, MULTINODULAR 08/17/2009   ANXIETY 08/17/2009   Migraine headache 08/17/2009   Hearing loss  08/17/2009   Osteoporosis 08/17/2009   ASYMPTOMATIC POSTMENOPAUSAL STATUS 08/17/2009   DYSPHAGIA 06/19/2007    Past Surgical History:  Procedure Laterality Date   BREAST BIOPSY Right    "benign"   CATARACT EXTRACTION W/ INTRAOCULAR LENS  IMPLANT, BILATERAL     CERVICAL CONE BIOPSY  2000s   CERVIX LESION DESTRUCTION  1991   "dysplasia; lesions"   CHOLECYSTECTOMY N/A 03/14/2013   Procedure: LAPAROSCOPIC CHOLECYSTECTOMY WITH ATTEMPTED INTRAOPERATIVE CHOLANGIOGRAM;  Surgeon: Harl Bowie, MD;  [facility];  Service: General;  Laterality: N/A;   laporoscopic abdominal surgery     "for endometriosis"   LEFT HEART CATH AND CORONARY ANGIOGRAPHY N/A 05/07/2018   Procedure: LEFT HEART CATH AND CORONARY ANGIOGRAPHY;  Surgeon: Belva Crome, MD;  Location: [facility];  Service: Cardiovascular;  Laterality: N/A;     OB History   No obstetric history on file.     Family History  Problem Relation Age of Onset   Colon cancer Father        Deceased, 76   Osteoporosis Mother        Living, 14   Healthy Brother    Healthy Brother    Healthy Son    Healthy Son    Thyroid disease Neg Hx    Goiter Neg Hx     Social History   Tobacco Use   Smoking status: Former    Packs/day: 1.00    Years: 7.00    Pack years: 7.00    Types: Cigarettes    Quit date: 02/28/1974    Years since quitting: 46.7   Smokeless tobacco: Never   Tobacco comments:    01/04/2018 "smoked when I was a teenager"  Vaping Use   Vaping Use: Never used  Substance Use Topics   Alcohol use: Yes    Alcohol/week: 28.0 standard drinks    Types: 28 Glasses of wine per week    Comment: 01/04/2018 "1 1/2 bottles of wine/day"   Drug use: Never    Home Medications Prior to Admission medications   Medication Sig Start Date End Date Taking? Authorizing Provider  acetaminophen (TYLENOL) 325 MG tablet Take 2 tablets (650 mg total) by mouth every 6 (six) hours as needed for moderate pain. 09/30/20   Nita Sells, MD  albuterol (VENTOLIN HFA) 108 (90 Base) MCG/ACT inhaler Inhale 2 puffs into the lungs every 6 (six) hours as needed for wheezing or shortness of breath. 10/19/20   Ronnell Freshwater, NP  alendronate (FOSAMAX) 70 MG tablet Take 70 mg by mouth every Monday. Take with a full glass of water on an empty stomach.    [provider]  busPIRone (BUSPAR) 15 MG tablet Take 15 mg by mouth 3 (three) times daily.    [provider]  DULoxetine (CYMBALTA) 60 MG capsule TAKE 1 CAPSULE (60 MG TOTAL) BY MOUTH DAILY. 10/23/14   Denita Lung, MD  gabapentin (NEURONTIN) 100 MG capsule Take 1 capsule (100 mg total) by mouth 3 (three) times daily for 6 days, THEN 1 capsule (100 mg total) 2 (two) times daily for 6 days, THEN 1 capsule (100 mg total) at bedtime for 6 days. 09/30/20 10/19/20  Nita Sells,  MD  hydrOXYzine (ATARAX/VISTARIL) 25 MG tablet Take 25 mg by mouth 3 (three) times daily.    [provider]  Multiple Vitamin (MULTIVITAMIN WITH MINERALS) TABS tablet Take 1 tablet by mouth daily. 01/10/18   Mikhail, Velta Addison, DO  norethindrone-ethinyl estradiol (FEMHRT 1/5) 1-5 MG-MCG TABS Take 1 tablet by mouth daily. For Osteoporosis 04/07/14   Lindell Spar I, NP  pantoprazole (PROTONIX) 40 MG tablet TAKE 1 TABLET BY MOUTH 2 TIMES DAILY. 04/22/20   Lorrene Reid, PA-C  propranolol (INDERAL) 10 MG tablet Take 1 tablet (10 mg total) by mouth 2 (two) times daily. 10/19/20   Ronnell Freshwater, NP  SUMAtriptan (IMITREX) 100 MG tablet TAKE 1 TABLET BY MOUTH AT THE FIRST SIGN OF MIGRAINE, MAY REPEAT IN 2 HOURS. MAX 2 TABLETS IN 24 HOURS 05/22/20   Lorrene Reid, PA-C  traZODone (DESYREL) 50 MG tablet Take 50 mg by mouth at bedtime.    [provider]    Allergies    Doxycycline, Codeine, and Tetracycline hcl  Review of Systems   Review of Systems  Constitutional:  Positive for fatigue. Negative for chills and fever.  HENT:  Negative for congestion.   Eyes:   Negative for pain.  Respiratory:  Negative for cough and shortness of breath.   Cardiovascular:  Positive for chest pain. Negative for leg swelling.  Gastrointestinal:  Positive for abdominal pain and nausea. Negative for diarrhea and vomiting.  Genitourinary:  Negative for dysuria.  Musculoskeletal:  Positive for myalgias.  Skin:  Negative for rash.  Neurological:  Negative for dizziness and headaches.  Psychiatric/Behavioral:  Positive for agitation. The patient is hyperactive.    Physical Exam Updated Vital Signs BP (!) 142/82   Pulse (!) 132   Temp 97.6 F (36.4 C) (Oral)   Resp (!) 23   SpO2 95%   Physical Exam Vitals and nursing note reviewed.  Constitutional:      General: She is not in acute distress.    Comments: Hard of hearing, pleasant 65 year old female appears uncomfortable is visibly tremulous and anxious  HENT:     Head: Normocephalic and atraumatic.     Nose: Nose normal.     Mouth/Throat:     Mouth: Mucous membranes are dry.  Eyes:     General: No scleral icterus. Cardiovascular:     Rate and Rhythm: Regular rhythm. Tachycardia present.     Pulses: Normal pulses.     Heart sounds: Normal heart sounds.  Pulmonary:     Effort: Pulmonary effort is normal. No respiratory distress.     Breath sounds: No wheezing.  Abdominal:     Palpations: Abdomen is soft.     Tenderness: There is no abdominal tenderness. There is no guarding or rebound.     Comments: Abdomen is soft nontender no guarding or rebound.  Musculoskeletal:     Cervical back: Normal range of motion.     Right lower leg: No edema.     Left lower leg: No edema.  Skin:    General: Skin is warm and dry.     Capillary Refill: Capillary refill takes less than 2 seconds.  Neurological:     Mental Status: She is alert. Mental status is at baseline.  Psychiatric:        Mood and Affect: Mood normal.        Behavior: Behavior normal.    ED Results / Procedures / Treatments   Labs (all labs  ordered are listed, but only abnormal results  are displayed) Labs Reviewed  COMPREHENSIVE METABOLIC PANEL - Abnormal; Notable for the following components:      Result Value   CO2 12 (*)    Glucose, Bld 225 (*)    Creatinine, Ser 1.07 (*)    AST 51 (*)    Total Bilirubin 1.5 (*)    GFR, Estimated 57 (*)    Anion gap 28 (*)    All other components within normal limits  CBC WITH DIFFERENTIAL/PLATELET - Abnormal; Notable for the following components:   WBC 15.6 (*)    Neutro Abs 13.8 (*)    All other components within normal limits  RESP PANEL BY RT-PCR (FLU A&B, COVID) ARPGX2  LIPASE, BLOOD  ETHANOL  PROTIME-INR  MAGNESIUM  URINALYSIS, ROUTINE W REFLEX MICROSCOPIC  PREGNANCY, URINE  RAPID URINE DRUG SCREEN, HOSP PERFORMED  TSH  TROPONIN I (HIGH SENSITIVITY)  TROPONIN I (HIGH SENSITIVITY)    EKG EKG Interpretation  Date/Time:  Friday November 20 2020 20:34:10 EDT Ventricular Rate:  164 PR Interval:    QRS Duration: 82 QT Interval:  314 QTC Calculation: 518 R Axis:   54 Text Interpretation: sinus tachycardia vs atrial flutter normal axis no acute ischemia Cannot rule out Anterior infarct , age undetermined Abnormal ECG Confirmed by Lorre Munroe ([id number]) on 11/20/2020 10:20:39 PM  Radiology DG Chest Portable 1 View  Result Date: 11/20/2020 CLINICAL DATA:  Chest pain, delirium tremens EXAM: PORTABLE CHEST 1 VIEW COMPARISON:  09/26/2020 FINDINGS: The heart size and mediastinal contours are within normal limits. Both lungs are clear. The visualized skeletal structures are unremarkable. IMPRESSION: No active disease. Electronically Signed   By: Fidela Salisbury M.D.   On: 11/20/2020 22:28    Procedures Procedures   Medications Ordered in ED Medications  thiamine tablet 100 mg ( Oral See Alternative 11/20/20 2210)    Or  thiamine (B-1) injection 100 mg (100 mg Intravenous Given 11/20/20 2210)  LORazepam (ATIVAN) injection 2 mg (2 mg Intramuscular Given 11/20/20 2053)  LORazepam  (ATIVAN) injection 2 mg (2 mg Intravenous Given 11/20/20 2209)  lactated ringers bolus 1,000 mL (1,000 mLs Intravenous New Bag/Given 11/20/20 2335)    ED Course  I have reviewed the triage vital signs and the nursing notes.  Pertinent labs & imaging results that were available during my care of the patient were reviewed by me and considered in my medical decision making (see chart for details).  Clinical Course as of 11/21/20 0041  Sat Nov 21, 2020  0030 Chest x-ray unremarkable agree of radiology read.  EKG shows sinus tachycardia. [WF]  0030 CMP without any significant transaminitis.  Anion gap of 28 perhaps alcohol-related/alcoholic ketosis versus starvation ketosis in the setting of alcohol use and poor p.o. intake.  Kidney function unremarkable.  CBC with leukocytosis perhaps stress to marginalization.  She is afebrile.  Ethanol negative.  Lipase within normal limits.  Coags within normal limits with normal INR and normal platelet count.  Magnesium within normal limits COVID influenza pending.  Troponin pending.  TSH pending. [WF]    Clinical Course User Index [WF] Tedd Sias, [place]   MDM Rules/Calculators/A&P                           Patient with similar presentation to the ER 2 months ago found to be not septic and to have alcohol related disease.  She was briefly in ICU but quickly transferred to the floor and discharged from there with  gabapentin taper.  Began drinking again 1-2 bottles of wine per day 1 month ago.  Last drink of alcohol was yesterday evening around 10 PM.  She does have hyperglycemia on CMP of 225 with anion gap of 28 will obtain beta hydroxybutyrate, lactic acid, VBG to confirm patient is not in DKA.  She is receiving fluids and has received 4 mg of Ativan seems to have improved her symptoms but she is still tachycardic although less tremulous on my reexamination  Discussed with Dr. Laverta Baltimore who will take over care and determine disposition. Final Clinical  Impression(s) / ED Diagnoses Final diagnoses:  Alcohol abuse  Alcohol withdrawal syndrome with complication [facility])    Rx / DC Orders ED Discharge Orders     None        Tedd Sias, [place] 11/21/20 0057    Arnaldo Natal, MD 11/21/20 [id number]

## 2020-11-21 ENCOUNTER — Encounter (HOSPITAL_COMMUNITY): Payer: Self-pay | Admitting: Internal Medicine

## 2020-11-21 DIAGNOSIS — Z800 Family history of malignant neoplasm of digestive organs: Secondary | ICD-10-CM | POA: Diagnosis not present

## 2020-11-21 DIAGNOSIS — F10230 Alcohol dependence with withdrawal, uncomplicated: Secondary | ICD-10-CM

## 2020-11-21 DIAGNOSIS — M810 Age-related osteoporosis without current pathological fracture: Secondary | ICD-10-CM | POA: Diagnosis present

## 2020-11-21 DIAGNOSIS — F1020 Alcohol dependence, uncomplicated: Secondary | ICD-10-CM

## 2020-11-21 DIAGNOSIS — G894 Chronic pain syndrome: Secondary | ICD-10-CM | POA: Diagnosis present

## 2020-11-21 DIAGNOSIS — K219 Gastro-esophageal reflux disease without esophagitis: Secondary | ICD-10-CM | POA: Diagnosis present

## 2020-11-21 DIAGNOSIS — F331 Major depressive disorder, recurrent, moderate: Secondary | ICD-10-CM | POA: Diagnosis present

## 2020-11-21 DIAGNOSIS — K700 Alcoholic fatty liver: Secondary | ICD-10-CM | POA: Diagnosis present

## 2020-11-21 DIAGNOSIS — E785 Hyperlipidemia, unspecified: Secondary | ICD-10-CM | POA: Diagnosis present

## 2020-11-21 DIAGNOSIS — Z7983 Long term (current) use of bisphosphonates: Secondary | ICD-10-CM | POA: Diagnosis not present

## 2020-11-21 DIAGNOSIS — Z87891 Personal history of nicotine dependence: Secondary | ICD-10-CM | POA: Diagnosis not present

## 2020-11-21 DIAGNOSIS — F10239 Alcohol dependence with withdrawal, unspecified: Secondary | ICD-10-CM | POA: Diagnosis present

## 2020-11-21 DIAGNOSIS — E538 Deficiency of other specified B group vitamins: Secondary | ICD-10-CM | POA: Diagnosis present

## 2020-11-21 DIAGNOSIS — K709 Alcoholic liver disease, unspecified: Secondary | ICD-10-CM | POA: Diagnosis present

## 2020-11-21 DIAGNOSIS — Z79899 Other long term (current) drug therapy: Secondary | ICD-10-CM | POA: Diagnosis not present

## 2020-11-21 DIAGNOSIS — F411 Generalized anxiety disorder: Secondary | ICD-10-CM | POA: Diagnosis present

## 2020-11-21 LAB — COMPREHENSIVE METABOLIC PANEL
ALT: 13 U/L (ref 0–44)
AST: 30 U/L (ref 15–41)
Albumin: 3.4 g/dL — ABNORMAL LOW (ref 3.5–5.0)
Alkaline Phosphatase: 41 U/L (ref 38–126)
Anion gap: 11 (ref 5–15)
BUN: 9 mg/dL (ref 8–23)
CO2: 22 mmol/L (ref 22–32)
Calcium: 8.4 mg/dL — ABNORMAL LOW (ref 8.9–10.3)
Chloride: 102 mmol/L (ref 98–111)
Creatinine, Ser: 0.67 mg/dL (ref 0.44–1.00)
GFR, Estimated: >60 mL/min (ref >60)
Glucose, Bld: 85 mg/dL (ref 70–99)
Potassium: 3.5 mmol/L (ref 3.5–5.1)
Sodium: 135 mmol/L (ref 135–145)
Total Bilirubin: 1.5 mg/dL — ABNORMAL HIGH (ref 0.3–1.2)
Total Protein: 5.9 g/dL — ABNORMAL LOW (ref 6.5–8.1)

## 2020-11-21 LAB — CBG MONITORING, ED: Glucose-Capillary: 102 mg/dL — ABNORMAL HIGH (ref 70–99)

## 2020-11-21 LAB — CBC
HCT: 35.6 % — ABNORMAL LOW (ref 36.0–46.0)
Hemoglobin: 12.1 g/dL (ref 12.0–15.0)
MCH: 32.4 pg (ref 26.0–34.0)
MCHC: 34 g/dL (ref 30.0–36.0)
MCV: 95.2 fL (ref 80.0–100.0)
Platelets: 240 10*3/uL (ref 150–400)
RBC: 3.74 MIL/uL — ABNORMAL LOW (ref 3.87–5.11)
RDW: 14 % (ref 11.5–15.5)
WBC: 8.7 10*3/uL (ref 4.0–10.5)
nRBC: 0 % (ref 0.0–0.2)

## 2020-11-21 LAB — TROPONIN I (HIGH SENSITIVITY)
Troponin I (High Sensitivity): 23 ng/L — ABNORMAL HIGH (ref <18)
Troponin I (High Sensitivity): 23 ng/L — ABNORMAL HIGH (ref <18)

## 2020-11-21 LAB — I-STAT VENOUS BLOOD GAS, ED
Acid-Base Excess: 3 mmol/L — ABNORMAL HIGH (ref 0.0–2.0)
Bicarbonate: 25 mmol/L (ref 20.0–28.0)
Calcium, Ion: 1.08 mmol/L — ABNORMAL LOW (ref 1.15–1.40)
HCT: 40 % (ref 36.0–46.0)
Hemoglobin: 13.6 g/dL (ref 12.0–15.0)
O2 Saturation: 99 %
Potassium: 3.8 mmol/L (ref 3.5–5.1)
Sodium: 137 mmol/L (ref 135–145)
TCO2: 26 mmol/L (ref 22–32)
pCO2, Ven: 30.3 mmHg — ABNORMAL LOW (ref 44.0–60.0)
pH, Ven: 7.524 — ABNORMAL HIGH (ref 7.250–7.430)
pO2, Ven: 126 mmHg — ABNORMAL HIGH (ref 32.0–45.0)

## 2020-11-21 LAB — TSH: TSH: 1.405 u[IU]/mL (ref 0.350–4.500)

## 2020-11-21 LAB — HEMOGLOBIN A1C
Hgb A1c MFr Bld: 4.7 % — ABNORMAL LOW (ref 4.8–5.6)
Mean Plasma Glucose: 88.19 mg/dL

## 2020-11-21 LAB — LACTIC ACID, PLASMA
Lactic Acid, Venous: 1.2 mmol/L (ref 0.5–1.9)
Lactic Acid, Venous: 1 mmol/L (ref 0.5–1.9)

## 2020-11-21 MED ORDER — LORAZEPAM 2 MG/ML IJ SOLN: 0.0000 mg | Freq: Three times a day (TID) | INTRAMUSCULAR | Status: DC

## 2020-11-21 MED ORDER — LACTATED RINGERS IV SOLN: INTRAVENOUS | Status: DC

## 2020-11-21 MED ORDER — HYDRALAZINE HCL 20 MG/ML IJ SOLN: 5.0000 mg | INTRAMUSCULAR | Status: DC | PRN

## 2020-11-21 MED ORDER — BUSPIRONE HCL 5 MG PO TABS
15.0000 mg | ORAL_TABLET | Freq: Three times a day (TID) | ORAL | Status: DC
  Administered 2020-11-21: 15 mg via ORAL
  Filled 2020-11-21: qty 2

## 2020-11-21 MED ORDER — DEXTROSE 50 % IV SOLN: 0.0000 mL | INTRAVENOUS | Status: DC | PRN

## 2020-11-21 MED ORDER — DULOXETINE HCL 60 MG PO CPEP
60.0000 mg | ORAL_CAPSULE | Freq: Every day | ORAL | Status: DC
  Administered 2020-11-22 – 2020-11-23 (×2): 60 mg via ORAL
  Filled 2020-11-21 (×2): qty 1

## 2020-11-21 MED ORDER — LACTATED RINGERS IV BOLUS
1000.0000 mL | INTRAVENOUS | Status: AC
  Administered 2020-11-21: 1000 mL via INTRAVENOUS

## 2020-11-21 MED ORDER — ONDANSETRON HCL 4 MG PO TABS: 4.0000 mg | ORAL_TABLET | Freq: Four times a day (QID) | ORAL | Status: DC | PRN

## 2020-11-21 MED ORDER — DOCUSATE SODIUM 100 MG PO CAPS
100.0000 mg | ORAL_CAPSULE | Freq: Two times a day (BID) | ORAL | Status: DC
  Administered 2020-11-21 – 2020-11-23 (×5): 100 mg via ORAL
  Filled 2020-11-21 (×5): qty 1

## 2020-11-21 MED ORDER — THIAMINE HCL 100 MG PO TABS
100.0000 mg | ORAL_TABLET | Freq: Every day | ORAL | Status: DC
  Administered 2020-11-21 – 2020-11-23 (×3): 100 mg via ORAL
  Filled 2020-11-21 (×2): qty 1

## 2020-11-21 MED ORDER — LORAZEPAM 2 MG/ML IJ SOLN: 1.0000 mg | INTRAMUSCULAR | Status: DC | PRN

## 2020-11-21 MED ORDER — PANTOPRAZOLE SODIUM 40 MG PO TBEC
40.0000 mg | DELAYED_RELEASE_TABLET | Freq: Two times a day (BID) | ORAL | Status: DC
  Administered 2020-11-21 – 2020-11-23 (×5): 40 mg via ORAL
  Filled 2020-11-21 (×5): qty 1

## 2020-11-21 MED ORDER — ALBUTEROL SULFATE (2.5 MG/3ML) 0.083% IN NEBU: 2.5000 mg | INHALATION_SOLUTION | RESPIRATORY_TRACT | Status: DC | PRN

## 2020-11-21 MED ORDER — ONDANSETRON HCL 4 MG/2ML IJ SOLN
4.0000 mg | Freq: Four times a day (QID) | INTRAMUSCULAR | Status: DC | PRN
  Administered 2020-11-21: 4 mg via INTRAVENOUS
  Filled 2020-11-21: qty 2

## 2020-11-21 MED ORDER — DIAZEPAM 5 MG PO TABS
5.0000 mg | ORAL_TABLET | Freq: Four times a day (QID) | ORAL | Status: DC
  Administered 2020-11-21 – 2020-11-23 (×7): 5 mg via ORAL
  Filled 2020-11-21 (×7): qty 1

## 2020-11-21 MED ORDER — HYDROXYZINE HCL 25 MG PO TABS
25.0000 mg | ORAL_TABLET | Freq: Three times a day (TID) | ORAL | Status: DC
  Administered 2020-11-21: 25 mg via ORAL
  Filled 2020-11-21: qty 1

## 2020-11-21 MED ORDER — ACETAMINOPHEN 325 MG PO TABS: 650.0000 mg | ORAL_TABLET | Freq: Four times a day (QID) | ORAL | Status: DC | PRN

## 2020-11-21 MED ORDER — DEXTROSE IN LACTATED RINGERS 5 % IV SOLN: INTRAVENOUS | Status: DC

## 2020-11-21 MED ORDER — THIAMINE HCL 100 MG/ML IJ SOLN
100.0000 mg | Freq: Every day | INTRAMUSCULAR | Status: DC
  Filled 2020-11-21: qty 2

## 2020-11-21 MED ORDER — ACETAMINOPHEN 650 MG RE SUPP: 650.0000 mg | Freq: Four times a day (QID) | RECTAL | Status: DC | PRN

## 2020-11-21 MED ORDER — DULOXETINE HCL 60 MG PO CPEP: 60.0000 mg | ORAL_CAPSULE | Freq: Every day | ORAL | Status: DC

## 2020-11-21 MED ORDER — LORAZEPAM 1 MG PO TABS: 1.0000 mg | ORAL_TABLET | ORAL | Status: DC | PRN

## 2020-11-21 MED ORDER — POTASSIUM CHLORIDE 10 MEQ/100ML IV SOLN
10.0000 meq | INTRAVENOUS | Status: AC
  Administered 2020-11-21 (×2): 10 meq via INTRAVENOUS
  Filled 2020-11-21 (×2): qty 100

## 2020-11-21 MED ORDER — ACETAMINOPHEN 500 MG PO TABS
1000.0000 mg | ORAL_TABLET | Freq: Once | ORAL | Status: AC
  Administered 2020-11-21: 1000 mg via ORAL
  Filled 2020-11-21: qty 2

## 2020-11-21 MED ORDER — TRAZODONE HCL 50 MG PO TABS
50.0000 mg | ORAL_TABLET | Freq: Every day | ORAL | Status: DC
  Administered 2020-11-21 – 2020-11-22 (×2): 50 mg via ORAL
  Filled 2020-11-21 (×2): qty 1

## 2020-11-21 MED ORDER — ADULT MULTIVITAMIN W/MINERALS CH
1.0000 | ORAL_TABLET | Freq: Every day | ORAL | Status: DC
  Administered 2020-11-21 – 2020-11-23 (×3): 1 via ORAL
  Filled 2020-11-21 (×3): qty 1

## 2020-11-21 MED ORDER — ENOXAPARIN SODIUM 40 MG/0.4ML IJ SOSY
40.0000 mg | PREFILLED_SYRINGE | INTRAMUSCULAR | Status: DC
  Administered 2020-11-22 – 2020-11-23 (×2): 40 mg via SUBCUTANEOUS
  Filled 2020-11-21 (×2): qty 0.4

## 2020-11-21 MED ORDER — FOLIC ACID 1 MG PO TABS
1.0000 mg | ORAL_TABLET | Freq: Every day | ORAL | Status: DC
  Administered 2020-11-21 – 2020-11-23 (×3): 1 mg via ORAL
  Filled 2020-11-21 (×3): qty 1

## 2020-11-21 MED ORDER — INSULIN REGULAR(HUMAN) IN NACL 100-0.9 UT/100ML-% IV SOLN: INTRAVENOUS | Status: DC

## 2020-11-21 MED ORDER — SODIUM CHLORIDE 0.9% FLUSH
3.0000 mL | Freq: Two times a day (BID) | INTRAVENOUS | Status: DC
  Administered 2020-11-21 – 2020-11-22 (×2): 3 mL via INTRAVENOUS

## 2020-11-21 MED ORDER — PROPRANOLOL HCL 10 MG PO TABS
10.0000 mg | ORAL_TABLET | Freq: Two times a day (BID) | ORAL | Status: DC
  Administered 2020-11-21 – 2020-11-23 (×5): 10 mg via ORAL
  Filled 2020-11-21 (×6): qty 1

## 2020-11-21 MED ORDER — LORAZEPAM 2 MG/ML IJ SOLN
0.0000 mg | INTRAMUSCULAR | Status: AC
  Administered 2020-11-21: 2 mg via INTRAVENOUS
  Filled 2020-11-21: qty 1

## 2020-11-21 MED ORDER — BISACODYL 5 MG PO TBEC: 5.0000 mg | DELAYED_RELEASE_TABLET | Freq: Every day | ORAL | Status: DC | PRN

## 2020-11-21 MED ORDER — ACETAMINOPHEN 325 MG PO TABS: 650.0000 mg | ORAL_TABLET | Freq: Once | ORAL | Status: DC

## 2020-11-21 MED ORDER — POLYETHYLENE GLYCOL 3350 17 G PO PACK: 17.0000 g | PACK | Freq: Every day | ORAL | Status: DC | PRN

## 2020-11-21 NOTE — H&P (Signed)
History and Physical    Terri Ayala GEX:[id number] DOB: [birth year] DOA: 11/20/2020  PCP: Lorrene Reid, PA-C Consultants:  [facility] - podiatry; Xu - orthopedics; Collene Mares - GIAngelena Form - cardiology Patient coming from:  Home - lives alone; NOK: Mother, Glennice Marcos, [phone]  Chief Complaint: ETOH withdrawal  HPI: Terri Ayala is a 65 y.o. female with medical history significant of alcohol dependence; chronic pain; and HLD presenting with alcohol withdrawal. She reports that she is going through ETOH withdrawal.  She has chest pain, vomiting, SOB.  She hurts all over.  She last drank last night, 1 bottle of wine - this is her usual intake but she drinks an extra 1.2 bottle on bad days.  She has had withdrawal before but no h/o seizures or DTs.  She has had periods of sobriety on and off for 30 years.  She wants to stay sober - "I'm getting too old for this shit."      ED Course: Carryover, per Dr. Nevada Crane:  65 year old female history of alcohol abuse drinks 2 bottles of wine per day, history of alcohol withdrawal, depression/anxiety who presented to [facility] ED due to nausea vomiting chest pain.  Last alcohol intake was about 36 hours ago.  Work-up in the ED concerning for alcohol withdrawal.  Initial troponin 23, cycling.  Serum glucose elevated greater than 200 with serum bicarb of 12.  No prior history of diabetes.  Lactic acid normal.  Was started on insulin drip and IV fluid in the ED.  Repeating chemistry panel, ordered A1c.  Review of Systems: As per HPI; otherwise review of systems reviewed and negative.   Ambulatory Status:  Ambulates without assistance  COVID Vaccine Status:  Complete plus boosters  Past Medical History:  Diagnosis Date   Alcohol withdrawal ([place]) 11/2017; 01/04/2018   Alcoholism ([place])    Anxiety    Arthritis    "hands" (01/04/2018)   ASYMPTOMATIC POSTMENOPAUSAL STATUS 08/17/2009   Chronic lower back pain    Depression    DYSPHAGIA 06/19/2007   Fatty liver, alcoholic     GERD (gastroesophageal reflux disease)    GOITER, MULTINODULAR 08/17/2009   Hyperlipidemia    Migraines    "not sure what triggers them; I'll have 1 q couple weeks or more; come 2 days in a row when they come" (01/04/2018)   Mitral valve prolapse    OSTEOPOROSIS 08/17/2009   PONV (postoperative nausea and vomiting)    Supraventricular tachycardia ([place])     Past Surgical History:  Procedure Laterality Date   BREAST BIOPSY Right    "benign"   CATARACT EXTRACTION W/ INTRAOCULAR LENS  IMPLANT, BILATERAL     CERVICAL CONE BIOPSY  2000s   CERVIX LESION DESTRUCTION  1991   "dysplasia; lesions"   CHOLECYSTECTOMY N/A 03/14/2013   Procedure: LAPAROSCOPIC CHOLECYSTECTOMY WITH ATTEMPTED INTRAOPERATIVE CHOLANGIOGRAM;  Surgeon: Harl Bowie, MD;  Location: [facility];  Service: General;  Laterality: N/A;   laporoscopic abdominal surgery     "for endometriosis"   LEFT HEART CATH AND CORONARY ANGIOGRAPHY N/A 05/07/2018   Procedure: LEFT HEART CATH AND CORONARY ANGIOGRAPHY;  Surgeon: Belva Crome, MD;  Location: [facility];  Service: Cardiovascular;  Laterality: N/A;    Social History   Socioeconomic History   Marital status: Single    Spouse name: Not on file   Number of children: Not on file   Years of education: Not on file   Highest education level: Not on file  Occupational History  Occupation: Brewing technologist - retired    Fish farm manager: [employer]  Tobacco Use   Smoking status: Former    Packs/day: 1.00    Years: 7.00    Pack years: 7.00    Types: Cigarettes    Quit date: 02/28/1974    Years since quitting: 46.7   Smokeless tobacco: Never   Tobacco comments:    01/04/2018 "smoked when I was a teenager"  Vaping Use   Vaping Use: Never used  Substance and Sexual Activity   Alcohol use: Yes    Alcohol/week: 28.0 standard drinks    Types: 28 Glasses of wine per week    Comment: 01/04/2018 "1 1/2 bottles of wine/day"   Drug use: Never   Sexual activity: Not Currently   Other Topics Concern   Not on file  Social History Narrative   Lives alone.  She has a BS biology.   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   Social Determinants of Health   Financial Resource Strain: Not on file  Food Insecurity: Not on file  Transportation Needs: Not on file  Physical Activity: Not on file  Stress: Not on file  Social Connections: Not on file  Intimate Partner Violence: Not on file    Allergies  Allergen Reactions   Doxycycline Swelling    Mouth swelling and sores   Codeine Itching   Tetracycline Hcl Swelling    Mouth swelling and sores    Family History  Problem Relation Age of Onset   Colon cancer Father        Deceased, 29   Osteoporosis Mother        Living, 71   Healthy Brother    Healthy Brother    Healthy Son    Healthy Son    Thyroid disease Neg Hx    Goiter Neg Hx     Prior to Admission medications   Medication Sig Start Date End Date Taking? Authorizing Provider  acetaminophen (TYLENOL) 325 MG tablet Take 2 tablets (650 mg total) by mouth every 6 (six) hours as needed for moderate pain. 09/30/20   Nita Sells, MD  albuterol (VENTOLIN HFA) 108 (90 Base) MCG/ACT inhaler Inhale 2 puffs into the lungs every 6 (six) hours as needed for wheezing or shortness of breath. 10/19/20   Ronnell Freshwater, NP  alendronate (FOSAMAX) 70 MG tablet Take 70 mg by mouth every Monday. Take with a full glass of water on an empty stomach.    [provider]  busPIRone (BUSPAR) 15 MG tablet Take 15 mg by mouth 3 (three) times daily.    [provider]  DULoxetine (CYMBALTA) 60 MG capsule TAKE 1 CAPSULE (60 MG TOTAL) BY MOUTH DAILY. 10/23/14   Denita Lung, MD  gabapentin (NEURONTIN) 100 MG capsule Take 1 capsule (100 mg total) by mouth 3 (three) times daily for 6 days, THEN 1 capsule (100 mg total) 2 (two) times daily for 6 days, THEN 1 capsule (100 mg total) at bedtime for 6 days. 09/30/20 10/19/20  Nita Sells, MD  hydrOXYzine (ATARAX/VISTARIL) 25 MG tablet Take 25 mg by mouth 3 (three) times daily.    [provider]  Multiple Vitamin (MULTIVITAMIN WITH MINERALS) TABS tablet Take 1 tablet by mouth daily. 01/10/18   Mikhail, Velta Addison, DO  norethindrone-ethinyl estradiol (FEMHRT 1/5) 1-5 MG-MCG TABS Take 1 tablet by mouth daily. For Osteoporosis 04/07/14   Lindell Spar I, NP  pantoprazole (PROTONIX) 40 MG tablet TAKE 1  TABLET BY MOUTH 2 TIMES DAILY. 04/22/20   Lorrene Reid, PA-C  propranolol (INDERAL) 10 MG tablet Take 1 tablet (10 mg total) by mouth 2 (two) times daily. 10/19/20   Ronnell Freshwater, NP  SUMAtriptan (IMITREX) 100 MG tablet TAKE 1 TABLET BY MOUTH AT THE FIRST SIGN OF MIGRAINE, MAY REPEAT IN 2 HOURS. MAX 2 TABLETS IN 24 HOURS 05/22/20   Lorrene Reid, PA-C  traZODone (DESYREL) 50 MG tablet Take 50 mg by mouth at bedtime.    [provider]    Physical Exam: Vitals:   11/21/20 1315 11/21/20 1350 11/21/20 1450 11/21/20 1608  BP:  126/70  134/82  Pulse: 90 (!) 132 89 83  Resp: 17  18 18   Temp:    99.2 F (37.3 C)  TempSrc:    Oral  SpO2: 96%  98% 100%     General:  Appears tremulous and like she is actively withdrawing Eyes:  PERRL, EOMI, normal lids, iris ENT:  grossly normal hearing, lips & tongue, mmm Neck:  no LAD, masses or thyromegaly Cardiovascular:  RR with tachycardia. No LE edema.  Respiratory:   CTA bilaterally with no wheezes/rales/rhonchi.  Normal respiratory effort. Abdomen:  soft, NT, ND Skin:  no rash or induration seen on limited exam Musculoskeletal:  grossly normal tone BUE/BLE, good ROM, no bony abnormality Psychiatric:  grossly normal mood and affect, speech fluent and appropriate, AOx3 Neurologic:  CN 2-12 grossly intact, moves all extremities in coordinated fashion    Radiological Exams on Admission: Independently reviewed - see discussion in A/P where applicable  DG Chest Portable 1 View  Result Date:  11/20/2020 CLINICAL DATA:  Chest pain, delirium tremens EXAM: PORTABLE CHEST 1 VIEW COMPARISON:  09/26/2020 FINDINGS: The heart size and mediastinal contours are within normal limits. Both lungs are clear. The visualized skeletal structures are unremarkable. IMPRESSION: No active disease. Electronically Signed   By: Fidela Salisbury M.D.   On: 11/20/2020 22:28    EKG: Independently reviewed.  Sinus tachycardia vs. aflutter with rate 164; nonspecific ST changes which are possibly rate-related   Labs on Admission: I have personally reviewed the available labs and imaging studies at the time of the admission.  Pertinent labs:   CO2 12 Glucose 225 BUN 11/Creatinine 1.07/GFR 57 AST 51/ALT 18/Bili 1.5 ABG: 7.524/30.3/126/25 Lactate 1.2 WBC 15.6 INR 1.0 ETOH <10   Assessment/Plan Principal Problem:   Alcohol withdrawal (HCC) Active Problems:   Alcohol use disorder, severe, dependence (HCC)   Major depressive disorder, recurrent episode, moderate (HCC)   Alcohol withdrawal -Patient with long-standing chronic ETOH dependence -Number of drinks per day: 1-2 bottles of wine -Number of admissions for management of alcohol withdrawal syndrome (detox): at least 7 dating back to 2015 (possibly more prior) -History of withdrawal seizures, ICU admissions, DTs: NO -Patient is exhibiting active s/sx of withdrawal, CIWA score is 18 -BAL on admission: 0 -She is at high risk for complications of withdrawal including seizures, DTs; he is at high risk for needing Precedex to help get through DTs -Will admit to progressive care at this time -CIWA protocol -Folate, thiamine, and MVI ordered -Will provide fixed-dose (Valium 5 mg PO q6h) and also symptom-triggered BZD (Ativan per CIWA protocol) given his high BAL level; h/o severe withdrawal; and/or current signs of severe withdrawal. -TOC team consult for possible inpatient treatment -Will also check UDS. -Elevated LFTs are likely related to  alcoholism -Consider offering a medication for Alcohol Use Disorder at the time of d/c, to include Disulfuram;  Naltrexone; or Acamprosate. -She reports that the Neurontin taper provided at the time of last d/c was quite helpful (still listed on MAR just in case)  ?Alcoholic keotacidosis -Reported CO2 on presentation was 12 with anion gap 28 -Glucose was 225 -Patient was written to start Endotool -However, VBG with HCO3 25 and this was normal at the time of repeat labs this AM and so insulin drip was never started -Normal A1c, she does not have diabetes -This may have been associated with a lab error vs. Early and quickly resolved alcoholic ketoacidosis  Mood d/o -Very likely associated with substance-induced mood d/o -Continue home Buspar, Cymbalta, Hydroxyzine, Trazodone, Propranolol  Osteoporosis -She is on HRT for osteoporosis -Will hold for now, as this is high risk given her excessive ETOH     Note: This patient has been tested and is pending for the novel coronavirus COVID-19. The patient has been fully vaccinated against COVID-19.   Level of care: Progressive DVT prophylaxis:  Lovenox  Code Status:  Full - confirmed with patient/family Family Communication: None present; I spoke with the patient's mother by telephone in the room at the time of admission. Disposition Plan:  The patient is from: home  Anticipated d/c is to: home without [facility] services   Anticipated d/c date will depend on clinical response to treatment, likely 2-3 days  Patient is currently: acutely ill Consults called: TOC team, nutrition  Admission status:  Admit - It is my clinical opinion that admission to INPATIENT is reasonable and necessary because of the expectation that this patient will require hospital care that crosses at least 2 midnights to treat this condition based on the medical complexity of the problems presented.  Given the aforementioned information, the predictability of an adverse outcome  is felt to be significant.    Karmen Bongo MD [facility]   How to contact the [facility] Attending or Consulting provider [place] or covering provider during after hours [place], for this patient?  Check the care team in [facility] and look for a) attending/consulting [facility] provider listed and b) the [facility] team listed Log into [email or web address] and use [place]'s universal password to access. If you do not have the password, please contact the hospital operator. Locate the [facility] provider you are looking for under [facility] and page to a number that you can be directly reached. If you still have difficulty reaching the provider, please page the [facility] (Director on Call) for the Hospitalists listed on amion for assistance.   11/21/2020, 5:48 PM

## 2020-11-22 LAB — BASIC METABOLIC PANEL
Anion gap: 10 (ref 5–15)
BUN: 5 mg/dL — ABNORMAL LOW (ref 8–23)
CO2: 26 mmol/L (ref 22–32)
Calcium: 8.6 mg/dL — ABNORMAL LOW (ref 8.9–10.3)
Chloride: 103 mmol/L (ref 98–111)
Creatinine, Ser: 0.87 mg/dL (ref 0.44–1.00)
GFR, Estimated: >60 mL/min (ref >60)
Glucose, Bld: 86 mg/dL (ref 70–99)
Potassium: 4 mmol/L (ref 3.5–5.1)
Sodium: 139 mmol/L (ref 135–145)

## 2020-11-22 LAB — CBC
HCT: 37.6 % (ref 36.0–46.0)
Hemoglobin: 12.6 g/dL (ref 12.0–15.0)
MCH: 32.3 pg (ref 26.0–34.0)
MCHC: 33.5 g/dL (ref 30.0–36.0)
MCV: 96.4 fL (ref 80.0–100.0)
Platelets: 213 10*3/uL (ref 150–400)
RBC: 3.9 MIL/uL (ref 3.87–5.11)
RDW: 14 % (ref 11.5–15.5)
WBC: 6.6 10*3/uL (ref 4.0–10.5)
nRBC: 0 % (ref 0.0–0.2)

## 2020-11-22 NOTE — Progress Notes (Signed)
PROGRESS NOTE    Terri Ayala  JGG:[id number] DOB: [birth year] DOA: 11/20/2020 PCP: Lorrene Reid, PA-C    Brief Narrative:  65 year old with history of alcohol dependence and chronic pain, hyperlipidemia presenting with alcohol withdrawal.  She tells me that she wants to quit, did not drink for 24 hours and is started feeling chest pain, vomiting shortness of breath and myalgia so came to the ER. At the emergency room hemodynamically stable.  Tremulous and shaky.  Admitted with alcohol withdrawal.   Assessment & Plan:   Principal Problem:   Alcohol withdrawal ([place]) Active Problems:   Alcohol use disorder, severe, dependence ([place])   Major depressive disorder, recurrent episode, moderate ([place])  Alcoholism with alcohol withdrawal syndrome: High risk of delirium tremens. Fall precautions.  Delirium precautions. Started on scheduled Valium with as needed Ativan, will continue. On multivitamins. Discontinue telemetry.  Discontinue maintenance IV fluids.  Mobilize. Motivated to quit, case management to help with rehab.  Interested in outpatient rehab.  Mood disorder with major depressive disorder: In remission. Currently on  Cymbalta, trazodone and propanolol that she will continue.  Folic acid deficiency: On multivitamins.  We will continue folic acid on discharge.  Chronic liver disease: Due to alcoholism.  Fairly stable.  Coagulation panels are normal.   DVT prophylaxis: enoxaparin (LOVENOX) injection 40 mg Start: 11/21/20 0930   Code Status: Full code Family Communication: None Disposition Plan: Status is: Inpatient  Remains inpatient appropriate because:Inpatient level of care appropriate due to severity of illness  Dispo: The patient is from: Home              Anticipated d/c is to: Home              Patient currently is not medically stable to d/c.   Difficult to place patient No         Consultants:  None  Procedures:  None  Antimicrobials:   None   Subjective: Patient seen and examined.  Denies any complaints today.  Denies any nausea vomiting or chest pain.  She tells me that her shakiness has slightly improved today.  Objective: Vitals:   11/22/20 0300 11/22/20 0400 11/22/20 0700 11/22/20 0759  BP: 121/86 136/80 131/80 (!) 154/92  Pulse:  79  (!) 111  Resp: 19 (!) 32 15 17  Temp:  100 F (37.8 C)    TempSrc:  Oral    SpO2: 96% 96%  98%  Weight:  60.9 kg      Intake/Output Summary (Last 24 hours) at 11/22/2020 1104 Last data filed at 11/22/2020 0000 Gross per 24 hour  Intake 524.01 ml  Output 200 ml  Net 324.01 ml   Filed Weights   11/22/20 0400  Weight: 60.9 kg    Examination:  General: Looks comfortable.  Slightly anxious. Cardiovascular: S1-S2 normal.  Tachycardic.  Regular rate rhythm. Respiratory: Bilateral clear.  No added sounds. Gastrointestinal: Soft.  Nontender.  Bowel sounds present. Ext: No cyanosis or edema. Neuro: Alert oriented x4.  No focal deficits.      Data Reviewed: I have personally reviewed following labs and imaging studies  CBC: Recent Labs  Lab 11/20/20 2031 11/21/20 0318 11/21/20 1021 11/22/20 0325  WBC 15.6*  --  8.7 6.6  NEUTROABS 13.8*  --   --   --   HGB 14.7 13.6 12.1 12.6  HCT 44.7 40.0 35.6* 37.6  MCV 98.7  --  95.2 96.4  PLT 373  --  240 546   Basic Metabolic Panel:  Recent Labs  Lab 11/20/20 2031 11/20/20 2157 11/21/20 0318 11/21/20 1021 11/22/20 0325  NA 138  --  137 135 139  K 4.0  --  3.8 3.5 4.0  CL 98  --   --  102 103  CO2 12*  --   --  22 26  GLUCOSE 225*  --   --  85 86  BUN 11  --   --  9 5*  CREATININE 1.07*  --   --  0.67 0.87  CALCIUM 9.5  --   --  8.4* 8.6*  MG  --  2.1  --   --   --    GFR: Estimated Creatinine Clearance: 54.6 mL/min (by C-G formula based on SCr of 0.87 mg/dL). Liver Function Tests: Recent Labs  Lab 11/20/20 2031 11/21/20 1021  AST 51* 30  ALT 18 13  ALKPHOS 59 41  BILITOT 1.5* 1.5*  PROT 8.1 5.9*   ALBUMIN 4.4 3.4*   Recent Labs  Lab 11/20/20 2031  LIPASE 47   No results for input(s): AMMONIA in the last 168 hours. Coagulation Profile: Recent Labs  Lab 11/20/20 2157  INR 1.0   Cardiac Enzymes: No results for input(s): CKTOTAL, CKMB, CKMBINDEX, TROPONINI in the last 168 hours. BNP (last 3 results) No results for input(s): PROBNP in the last 8760 hours. HbA1C: Recent Labs    11/21/20 1021  HGBA1C 4.7*   CBG: Recent Labs  Lab 11/21/20 0446  GLUCAP 102*   Lipid Profile: No results for input(s): CHOL, HDL, LDLCALC, TRIG, CHOLHDL, LDLDIRECT in the last 72 hours. Thyroid Function Tests: Recent Labs    11/20/20 2157  TSH 1.405   Anemia Panel: No results for input(s): VITAMINB12, FOLATE, FERRITIN, TIBC, IRON, RETICCTPCT in the last 72 hours. Sepsis Labs: Recent Labs  Lab 11/21/20 0304 11/21/20 1021  LATICACIDVEN 1.2 1.0    No results found for this or any previous visit (from the past 240 hour(s)).       Radiology Studies: DG Chest Portable 1 View  Result Date: 11/20/2020 CLINICAL DATA:  Chest pain, delirium tremens EXAM: PORTABLE CHEST 1 VIEW COMPARISON:  09/26/2020 FINDINGS: The heart size and mediastinal contours are within normal limits. Both lungs are clear. The visualized skeletal structures are unremarkable. IMPRESSION: No active disease. Electronically Signed   By: Fidela Salisbury M.D.   On: 11/20/2020 22:28        Scheduled Meds:  diazepam  5 mg Oral Q6H   docusate sodium  100 mg Oral BID   DULoxetine  60 mg Oral Daily   enoxaparin (LOVENOX) injection  40 mg Subcutaneous H68G   folic acid  1 mg Oral Daily   LORazepam  0-4 mg Intravenous Q4H   Followed by   Derrill Memo ON 11/23/2020] LORazepam  0-4 mg Intravenous Q8H   multivitamin with minerals  1 tablet Oral Daily   pantoprazole  40 mg Oral BID   propranolol  10 mg Oral BID   sodium chloride flush  3 mL Intravenous Q12H   thiamine  100 mg Oral Daily   Or   thiamine  100 mg Intravenous  Daily   traZODone  50 mg Oral QHS   Continuous Infusions:   LOS: 1 day    Time spent: 30 minutes    Barb Merino, MD [facility] Pager [phone]

## 2020-11-23 MED ORDER — DIAZEPAM 5 MG PO TABS: ORAL_TABLET | ORAL | 0 refills | Status: DC

## 2020-11-23 MED ORDER — GABAPENTIN 100 MG PO CAPS: 100.0000 mg | ORAL_CAPSULE | Freq: Three times a day (TID) | ORAL | 0 refills | Status: DC

## 2020-11-23 MED ORDER — FOLIC ACID 1 MG PO TABS: 1.0000 mg | ORAL_TABLET | Freq: Every day | ORAL | 2 refills | Status: AC

## 2020-11-23 NOTE — Discharge Summary (Signed)
Physician Discharge Summary  Terri Ayala:[id number] DOB: [birth year] DOA: 11/20/2020  PCP: Lorrene Reid, PA-C  Admit date: 11/20/2020 Discharge date: 11/23/2020  Admitted From: Home Disposition: Home  Recommendations for Outpatient Follow-up:  Follow up with PCP in 1-2 weeks Follow-up with outpatient alcohol and drug rehab resources as advised.  Home Health: Not applicable Equipment/Devices: Not applicable  Discharge Condition: Stable CODE STATUS: Full code Diet recommendation: Regular diet  Discharge summary: 65 year old with alcohol dependence, chronic pain syndrome, hyperlipidemia presented with alcohol withdrawal.  She wanted to quit, did not drink for about 24 hours and he started having chest pain, nausea and vomiting, shortness of breath and myalgias so came to the ER.  In the emergency room she was hemodynamically stable.  Tremulous and shaky.  Admitted with alcohol withdrawal syndrome.  Patient was treated symptomatically with benzodiazepines, she was treated with Valium, multivitamins.  She did very good clinical recovery.  She did not show any evidence of complications of delirium tremens.  Currently on Valium.  Has not taken alcohol for last 4 days.  Her folic acid level were low.  Vitamin B12 levels were adequate.  Plan: Alcohol and drug resources discussed and counseled, she wants to go for outpatient rehab.  Resources were given. Since patient did very well without evidence of complications, she will be discharged on 4 more days of Valium taper.  Extensively discussed about not drinking alcohol along with benzodiazepines and patient is agreeable.  She has taken this before. Patient tells me that she had good response to gabapentin taper in the past, she had decreased craving and anxiety with gabapentin.  We will start patient on gabapentin 100 mg 3 times a day.  She can continue this from outpatient. Patient is on Cymbalta and trazodone that she can  continue.    Discharge Diagnoses:  Principal Problem:   Alcohol withdrawal ([place]) Active Problems:   Alcohol use disorder, severe, dependence ([place])   Major depressive disorder, recurrent episode, moderate ([place])    Discharge Instructions  Discharge Instructions     Call MD for:  difficulty breathing, headache or visual disturbances   Complete by: As directed    Call MD for:  persistant dizziness or light-headedness   Complete by: As directed    Call MD for:  persistant nausea and vomiting   Complete by: As directed    Diet general   Complete by: As directed    Discharge instructions   Complete by: As directed    Do not drink any alcohol along with the medications you are prescribed. Follow up with resources as advised to help you with alcohol rehab   Increase activity slowly   Complete by: As directed       Allergies as of 11/23/2020       Reactions   Doxycycline Swelling   Mouth swelling and sores   Codeine Itching   Tetracycline Hcl Swelling   Mouth swelling and sores        Medication List     STOP taking these medications    busPIRone 15 MG tablet Commonly known as: BUSPAR       TAKE these medications    acetaminophen 325 MG tablet Commonly known as: TYLENOL Take 2 tablets (650 mg total) by mouth every 6 (six) hours as needed for moderate pain.   albuterol 108 (90 Base) MCG/ACT inhaler Commonly known as: VENTOLIN HFA Inhale 2 puffs into the lungs every 6 (six) hours as needed for wheezing or shortness of  breath.   alendronate 70 MG tablet Commonly known as: FOSAMAX Take 70 mg by mouth every Monday. Take with a full glass of water on an empty stomach.   diazepam 5 MG tablet Commonly known as: Valium 1 tab two times a day for 2 days 1 tab daily for 2 days   DULoxetine 60 MG capsule Commonly known as: CYMBALTA TAKE 1 CAPSULE (60 MG TOTAL) BY MOUTH DAILY.   folic acid 1 MG tablet Commonly known as: FOLVITE Take 1 tablet (1 mg total) by  mouth daily.   gabapentin 100 MG capsule Commonly known as: Neurontin Take 1 capsule (100 mg total) by mouth 3 (three) times daily. What changed: See the new instructions.   hydrOXYzine 25 MG tablet Commonly known as: ATARAX/VISTARIL Take 25 mg by mouth 3 (three) times daily.   multivitamin with minerals Tabs tablet Take 1 tablet by mouth daily.   norethindrone-ethinyl estradiol 1-5 MG-MCG Tabs tablet Commonly known as: FEMHRT 1/5 Take 1 tablet by mouth daily. For Osteoporosis   pantoprazole 40 MG tablet Commonly known as: PROTONIX TAKE 1 TABLET BY MOUTH 2 TIMES DAILY.   propranolol 10 MG tablet Commonly known as: INDERAL Take 1 tablet (10 mg total) by mouth 2 (two) times daily.   SUMAtriptan 100 MG tablet Commonly known as: IMITREX TAKE 1 TABLET BY MOUTH AT THE FIRST SIGN OF MIGRAINE, MAY REPEAT IN 2 HOURS. MAX 2 TABLETS IN 24 HOURS What changed: See the new instructions.   traZODone 50 MG tablet Commonly known as: DESYREL Take 50 mg by mouth at bedtime.        Follow-up Information     Lorrene Reid, PA-C Follow up in 2 week(s).   Specialty: Physician Assistant Contact information: [address] [place] [address] [phone]                Allergies  Allergen Reactions   Doxycycline Swelling    Mouth swelling and sores   Codeine Itching   Tetracycline Hcl Swelling    Mouth swelling and sores    Consultations: None   Procedures/Studies: DG Chest Portable 1 View  Result Date: 11/20/2020 CLINICAL DATA:  Chest pain, delirium tremens EXAM: PORTABLE CHEST 1 VIEW COMPARISON:  09/26/2020 FINDINGS: The heart size and mediastinal contours are within normal limits. Both lungs are clear. The visualized skeletal structures are unremarkable. IMPRESSION: No active disease. Electronically Signed   By: Fidela Salisbury M.D.   On: 11/20/2020 22:28   (Echo, Carotid, EGD, Colonoscopy, ERCP)    Subjective: Patient seen and examined.  No  overnight events.  Wants to go home.  She did get scheduled doses of Valium yesterday and last night, however was not noticed to have any tremor or shakiness.  Denies any hallucinations. She wants to go home.   Discharge Exam: Vitals:   11/23/20 0016 11/23/20 0515  BP: (!) 137/92 126/83  Pulse: 93 73  Resp: 14 14  Temp: 98.8 F (37.1 C) 98.9 F (37.2 C)  SpO2: 96% 96%   Vitals:   11/22/20 2032 11/22/20 2229 11/23/20 0016 11/23/20 0515  BP:  131/84 (!) 137/92 126/83  Pulse:  79 93 73  Resp: 16 14 14 14   Temp: 98.7 F (37.1 C) 98.7 F (37.1 C) 98.8 F (37.1 C) 98.9 F (37.2 C)  TempSrc: Oral Oral Oral Oral  SpO2:  96% 96% 96%  Weight:   60.7 kg     General: Pt is alert, awake, not in acute distress  Hard of hearing, but very comfortable and talkative. Cardiovascular: RRR, S1/S2 +, no rubs, no gallops Respiratory: CTA bilaterally, no wheezing, no rhonchi Abdominal: Soft, NT, ND, bowel sounds + Extremities: no edema, no cyanosis Without any delusions.  Without any hallucinations.  No tremors.    The results of significant diagnostics from this hospitalization (including imaging, microbiology, ancillary and laboratory) are listed below for reference.     Microbiology: No results found for this or any previous visit (from the past 240 hour(s)).   Labs: BNP (last 3 results) No results for input(s): BNP in the last 8760 hours. Basic Metabolic Panel: Recent Labs  Lab 11/20/20 2031 11/20/20 2157 11/21/20 0318 11/21/20 1021 11/22/20 0325  NA 138  --  137 135 139  K 4.0  --  3.8 3.5 4.0  CL 98  --   --  102 103  CO2 12*  --   --  22 26  GLUCOSE 225*  --   --  85 86  BUN 11  --   --  9 5*  CREATININE 1.07*  --   --  0.67 0.87  CALCIUM 9.5  --   --  8.4* 8.6*  MG  --  2.1  --   --   --    Liver Function Tests: Recent Labs  Lab 11/20/20 2031 11/21/20 1021  AST 51* 30  ALT 18 13  ALKPHOS 59 41  BILITOT 1.5* 1.5*  PROT 8.1 5.9*  ALBUMIN 4.4 3.4*   Recent  Labs  Lab 11/20/20 2031  LIPASE 47   No results for input(s): AMMONIA in the last 168 hours. CBC: Recent Labs  Lab 11/20/20 2031 11/21/20 0318 11/21/20 1021 11/22/20 0325  WBC 15.6*  --  8.7 6.6  NEUTROABS 13.8*  --   --   --   HGB 14.7 13.6 12.1 12.6  HCT 44.7 40.0 35.6* 37.6  MCV 98.7  --  95.2 96.4  PLT 373  --  240 213   Cardiac Enzymes: No results for input(s): CKTOTAL, CKMB, CKMBINDEX, TROPONINI in the last 168 hours. BNP: Invalid input(s): POCBNP CBG: Recent Labs  Lab 11/21/20 0446  GLUCAP 102*   D-Dimer No results for input(s): DDIMER in the last 72 hours. Hgb A1c Recent Labs    11/21/20 1021  HGBA1C 4.7*   Lipid Profile No results for input(s): CHOL, HDL, LDLCALC, TRIG, CHOLHDL, LDLDIRECT in the last 72 hours. Thyroid function studies Recent Labs    11/20/20 2157  TSH 1.405   Anemia work up No results for input(s): VITAMINB12, FOLATE, FERRITIN, TIBC, IRON, RETICCTPCT in the last 72 hours. Urinalysis    Component Value Date/Time   COLORURINE YELLOW 09/26/2020 1730   APPEARANCEUR CLEAR 09/26/2020 1730   LABSPEC 1.039 (H) 09/26/2020 1730   LABSPEC 1.030 09/01/2017 1443   PHURINE 5.0 09/26/2020 1730   GLUCOSEU NEGATIVE 09/26/2020 1730   HGBUR SMALL (A) 09/26/2020 1730   BILIRUBINUR NEGATIVE 09/26/2020 1730   BILIRUBINUR neg 08/28/2019 1612   KETONESUR 20 (A) 09/26/2020 1730   PROTEINUR 100 (A) 09/26/2020 1730   UROBILINOGEN 0.2 08/28/2019 1612   UROBILINOGEN 0.2 07/25/2007 0900   NITRITE NEGATIVE 09/26/2020 1730   LEUKOCYTESUR SMALL (A) 09/26/2020 1730   Sepsis Labs Invalid input(s): PROCALCITONIN,  WBC,  LACTICIDVEN Microbiology No results found for this or any previous visit (from the past 240 hour(s)).   Time coordinating discharge:  35 minutes  SIGNED:   Barb Merino, MD  [facility] 11/23/2020, 8:09 AM

## 2020-11-23 NOTE — Plan of Care (Signed)

## 2020-11-23 NOTE — Progress Notes (Signed)
Patient is leaving via wheelchair with uber driver.

## 2020-11-23 NOTE — Progress Notes (Signed)
Mobility Specialist Progress Note:   11/23/20 0930  Mobility  Activity Ambulated in hall  Range of Motion/Exercises Active;All extremities  Level of Assistance Standby assist, set-up cues, supervision of patient - no hands on  Assistive Device Front wheel walker  Minutes Ambulated 5 minutes  Distance Ambulated (ft) 520 ft  Mobility Ambulated with assistance in hallway  Mobility Response Tolerated well  Mobility performed by Mobility specialist  Transport method Ambulatory  $Mobility charge 1 Mobility   Pt received in bed willing to participate in mobility. Ambulated in hallway at supervision. Used RW for 260 ft and used no AD for the rest of the walk. Pt returned to chair by sink for bath with nurse tech present.   [facility] Health and safety inspector Phone [phone]

## 2020-11-23 NOTE — TOC CAGE-AID Note (Signed)
Transition of Care [facility]) - CAGE-AID Screening   Patient Details  Name: Terri Ayala MRN: [id number] Date of Birth: [birth year]  Transition of Care [facility]) CM/SW Contact:    Zenon Mayo, RN Phone Number: 11/23/2020, 9:48 AM   Clinical Narrative: NCM gave patient SA resources, she was very appreciative.   CAGE-AID Screening:      Have People Annoyed You By Critizing Your Drinking Or Drug Use?: Yes Have You Felt Bad Or Guilty About Your Drinking Or Drug Use?: Yes Have You Ever Had a Drink or Used Drugs First Thing In The Morning to Steady Your Nerves or to Get Rid of a Hangover?: Yes    Substance Abuse Education Offered: Yes  Substance abuse interventions: Scientist, clinical (histocompatibility and immunogenetics)

## 2020-11-23 NOTE — Progress Notes (Signed)
Nutrition Brief Note  Patient identified on the Malnutrition Screening Tool (MST) Report  Wt Readings from Last 15 Encounters:  11/23/20 60.7 kg  10/19/20 65.8 kg  09/30/20 59.6 kg  04/15/20 59 kg  03/25/20 59 kg  01/14/20 59 kg  08/28/19 62.6 kg  04/04/19 62 kg  03/16/19 56.7 kg  02/08/19 58.2 kg  05/08/18 65.5 kg  02/22/18 63.5 kg  01/30/18 68 kg  01/04/18 68 kg  12/10/17 4.46 kg   65 year old with alcohol dependence, chronic pain syndrome, hyperlipidemia presented with alcohol withdrawal.  She wanted to quit, did not drink for about 24 hours and he started having chest pain, nausea and vomiting, shortness of breath and myalgias so came to the ER.  In the emergency room she was hemodynamically stable.  Tremulous and shaky.  Admitted with alcohol withdrawal syndrome.  Per MD notes, pt to be discharged today.   Current diet order is regular, patient is consuming approximately n/a% of meals at this time. Labs and medications reviewed.   No nutrition interventions warranted at this time. If nutrition issues arise, please consult RD.   Loistine Chance, RD, LDN, [place] Registered Dietitian II Certified Diabetes Care and Education Specialist Please refer to [facility] for RD and/or RD on-call/weekend/after hours pager

## 2020-11-23 NOTE — Progress Notes (Signed)
Patient given discharge instructions and stated understanding. 

## 2020-11-23 NOTE — TOC Transition Note (Signed)
Transition of Care [facility]) - CM/SW Discharge Note   Patient Details  Name: Terri Ayala MRN: [id number] Date of Birth: [birth year]  Transition of Care [facility]) CM/SW Contact:  Zenon Mayo, RN Phone Number: 11/23/2020, 10:07 AM   Clinical Narrative:    Patient is for dc today, NCM gave her some SA resources she states she would like to get the outpatient facility drug rehab facilities as well.  NCM will get this to patient as well.  She will also need assistance with  [facility] transport at dc.    Final next level of care: Home/Self Care Barriers to Discharge: No Barriers Identified   Patient Goals and CMS Choice Patient states their goals for this hospitalization and ongoing recovery are:: return home   Choice offered to / list presented to : NA  Discharge Placement                       Discharge Plan and Services                  DME Agency: NA       HH Arranged: NA          Social Determinants of Health (SDOH) Interventions     Readmission Risk Interventions No flowsheet data found.

## 2020-11-25 ENCOUNTER — Inpatient Hospital Stay: Payer: Medicare Other | Admitting: Physician Assistant

## 2020-11-27 ENCOUNTER — Telehealth: Payer: Self-pay

## 2020-11-27 NOTE — Telephone Encounter (Signed)
Transition Care Management Unsuccessful Follow-up Telephone Call  Date of discharge and from where:  11/23/2020 / [facility]   Attempts:  1st Attempt  Reason for unsuccessful TCM follow-up call:  Left voice message Quinn Plowman RN,BSN,CCM RN Case Manager [place]. [phone]

## 2020-11-30 ENCOUNTER — Other Ambulatory Visit: Payer: Self-pay | Admitting: Physician Assistant

## 2020-11-30 DIAGNOSIS — G43009 Migraine without aura, not intractable, without status migrainosus: Secondary | ICD-10-CM

## 2020-12-07 ENCOUNTER — Encounter: Payer: Self-pay | Admitting: Physician Assistant

## 2020-12-07 ENCOUNTER — Ambulatory Visit (INDEPENDENT_AMBULATORY_CARE_PROVIDER_SITE_OTHER): Payer: Medicare Other | Admitting: Physician Assistant

## 2020-12-07 ENCOUNTER — Other Ambulatory Visit: Payer: Self-pay

## 2020-12-07 VITALS — BP 123/79 | HR 100 | Temp 97.7°F | Ht 62.0 in | Wt 143.5 lb

## 2020-12-07 DIAGNOSIS — E538 Deficiency of other specified B group vitamins: Secondary | ICD-10-CM

## 2020-12-07 DIAGNOSIS — Z09 Encounter for follow-up examination after completed treatment for conditions other than malignant neoplasm: Secondary | ICD-10-CM

## 2020-12-07 DIAGNOSIS — F10930 Alcohol use, unspecified with withdrawal, uncomplicated: Secondary | ICD-10-CM | POA: Diagnosis not present

## 2020-12-07 DIAGNOSIS — Z23 Encounter for immunization: Secondary | ICD-10-CM

## 2020-12-07 DIAGNOSIS — F331 Major depressive disorder, recurrent, moderate: Secondary | ICD-10-CM

## 2020-12-07 DIAGNOSIS — F1020 Alcohol dependence, uncomplicated: Secondary | ICD-10-CM

## 2020-12-07 NOTE — Progress Notes (Signed)
Established Patient Office Visit  Subjective:  Patient ID: Terri Ayala, female    DOB: [birth year]  Age: 64 y.o. MRN: [id number]  CC:  Chief Complaint  Patient presents with   Hospitalization Follow-up    HPI Terri Ayala presents for hospital follow up. Patient states doing better. Plans to enroll in the outpatient rehabilitation program through [facility]. Patient states started drinking again because she had to go back home to take care of her mother who broke her hip. Last alcohol drink was before hospitalization. Denies recent cravings. Does report intermittent mild shakiness during the day. No altered mental status or seizures.  Discharge summary: Admit date: 11/20/2020 Discharge date: 11/23/2020   Admitted From: Home Disposition: Home   Recommendations for Outpatient Follow-up:  Follow up with PCP in 1-2 weeks Follow-up with outpatient alcohol and drug rehab resources as advised.   Home Health: Not applicable Equipment/Devices: Not applicable   Discharge Condition: Stable CODE STATUS: Full code Diet recommendation: Regular diet   Discharge summary: 66 year old with alcohol dependence, chronic pain syndrome, hyperlipidemia presented with alcohol withdrawal.  She wanted to quit, did not drink for about 24 hours and he started having chest pain, nausea and vomiting, shortness of breath and myalgias so came to the ER.  In the emergency room she was hemodynamically stable.  Tremulous and shaky.  Admitted with alcohol withdrawal syndrome.   Patient was treated symptomatically with benzodiazepines, she was treated with Valium, multivitamins.  She did very good clinical recovery.  She did not show any evidence of complications of delirium tremens.  Currently on Valium.  Has not taken alcohol for last 4 days.  Her folic acid level were low.  Vitamin B12 levels were adequate.   Plan: Alcohol and drug resources discussed and counseled, she wants to go for outpatient rehab.  Resources were  given. Since patient did very well without evidence of complications, she will be discharged on 4 more days of Valium taper.  Extensively discussed about not drinking alcohol along with benzodiazepines and patient is agreeable.  She has taken this before. Patient tells me that she had good response to gabapentin taper in the past, she had decreased craving and anxiety with gabapentin.  We will start patient on gabapentin 100 mg 3 times a day.  She can continue this from outpatient. Patient is on Cymbalta and trazodone that she can continue.       Discharge Diagnoses:  Principal Problem:   Alcohol withdrawal ([place]) Active Problems:   Alcohol use disorder, severe, dependence ([place])   Major depressive disorder, recurrent episode, moderate ([place])       Discharge Instructions   Discharge Instructions       Call MD for:  difficulty breathing, headache or visual disturbances   Complete by: As directed      Call MD for:  persistant dizziness or light-headedness   Complete by: As directed      Call MD for:  persistant nausea and vomiting   Complete by: As directed      Diet general   Complete by: As directed      Discharge instructions   Complete by: As directed      Do not drink any alcohol along with the medications you are prescribed. Follow up with resources as advised to help you with alcohol rehab    Increase activity slowly   Complete by: As directed       Past Medical History:  Diagnosis Date   Alcohol withdrawal (  [place]) 11/2017; 01/04/2018   Alcoholism ([place])    Anxiety    Arthritis    "hands" (01/04/2018)   ASYMPTOMATIC POSTMENOPAUSAL STATUS 08/17/2009   Chronic lower back pain    Depression    DYSPHAGIA 06/19/2007   Fatty liver, alcoholic    GERD (gastroesophageal reflux disease)    GOITER, MULTINODULAR 08/17/2009   Hyperlipidemia    Migraines    "not sure what triggers them; I'll have 1 q couple weeks or more; come 2 days in a row when they come" (01/04/2018)   Mitral  valve prolapse    OSTEOPOROSIS 08/17/2009   PONV (postoperative nausea and vomiting)    Supraventricular tachycardia ([place])     Past Surgical History:  Procedure Laterality Date   BREAST BIOPSY Right    "benign"   CATARACT EXTRACTION W/ INTRAOCULAR LENS  IMPLANT, BILATERAL     CERVICAL CONE BIOPSY  2000s   CERVIX LESION DESTRUCTION  1991   "dysplasia; lesions"   CHOLECYSTECTOMY N/A 03/14/2013   Procedure: LAPAROSCOPIC CHOLECYSTECTOMY WITH ATTEMPTED INTRAOPERATIVE CHOLANGIOGRAM;  Surgeon: Harl Bowie, MD;  Location: [facility];  Service: General;  Laterality: N/A;   laporoscopic abdominal surgery     "for endometriosis"   LEFT HEART CATH AND CORONARY ANGIOGRAPHY N/A 05/07/2018   Procedure: LEFT HEART CATH AND CORONARY ANGIOGRAPHY;  Surgeon: Belva Crome, MD;  Location: [facility];  Service: Cardiovascular;  Laterality: N/A;    Family History  Problem Relation Age of Onset   Colon cancer Father        Deceased, 20   Osteoporosis Mother        Living, 56   Healthy Brother    Healthy Brother    Healthy Son    Healthy Son    Thyroid disease Neg Hx    Goiter Neg Hx     Social History   Socioeconomic History   Marital status: Single    Spouse name: Not on file   Number of children: Not on file   Years of education: Not on file   Highest education level: Not on file  Occupational History   Occupation: Brewing technologist - retired    Fish farm manager: [employer]  Tobacco Use   Smoking status: Former    Packs/day: 1.00    Years: 7.00    Pack years: 7.00    Types: Cigarettes    Quit date: 02/28/1974    Years since quitting: 46.8   Smokeless tobacco: Never   Tobacco comments:    01/04/2018 "smoked when I was a teenager"  Vaping Use   Vaping Use: Never used  Substance and Sexual Activity   Alcohol use: Yes    Alcohol/week: 28.0 standard drinks    Types: 28 Glasses of wine per week    Comment: 01/04/2018 "1 1/2 bottles of wine/day"   Drug use: Never   Sexual  activity: Not Currently  Other Topics Concern   Not on file  Social History Narrative   Lives alone.  She has a BS biology.   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   Social Determinants of Health   Financial Resource Strain: Not on file  Food Insecurity: Not on file  Transportation Needs: Not on file  Physical Activity: Not on file  Stress: Not on file  Social Connections: Not on file  Intimate Partner Violence: Not on file    Outpatient Medications Prior to Visit  Medication Sig Dispense Refill   acetaminophen (TYLENOL)  325 MG tablet Take 2 tablets (650 mg total) by mouth every 6 (six) hours as needed for moderate pain.     albuterol (VENTOLIN HFA) 108 (90 Base) MCG/ACT inhaler Inhale 2 puffs into the lungs every 6 (six) hours as needed for wheezing or shortness of breath. 8.5 g 2   alendronate (FOSAMAX) 70 MG tablet Take 70 mg by mouth every Monday. Take with a full glass of water on an empty stomach.     busPIRone (BUSPAR) 15 MG tablet Take 15 mg by mouth 3 (three) times daily.     diazepam (VALIUM) 5 MG tablet 1 tab two times a day for 2 days 1 tab daily for 2 days 6 tablet 0   DULoxetine (CYMBALTA) 60 MG capsule TAKE 1 CAPSULE (60 MG TOTAL) BY MOUTH DAILY. (Patient taking differently: Take 60 mg by mouth daily.) 30 capsule 0   folic acid (FOLVITE) 1 MG tablet Take 1 tablet (1 mg total) by mouth daily. 30 tablet 2   gabapentin (NEURONTIN) 100 MG capsule Take 1 capsule (100 mg total) by mouth 3 (three) times daily. 90 capsule 0   hydrOXYzine (ATARAX/VISTARIL) 25 MG tablet Take 25 mg by mouth 3 (three) times daily.     meloxicam (MOBIC) 15 MG tablet Take 15 mg by mouth daily.     Multiple Vitamin (MULTIVITAMIN WITH MINERALS) TABS tablet Take 1 tablet by mouth daily. 30 tablet 0   norethindrone-ethinyl estradiol (FEMHRT 1/5) 1-5 MG-MCG TABS Take 1 tablet by mouth daily. For Osteoporosis 28 tablet    pantoprazole (PROTONIX) 40 MG tablet TAKE 1 TABLET BY  MOUTH 2 TIMES DAILY. (Patient taking differently: Take 40 mg by mouth 2 (two) times daily.) 180 tablet 1   propranolol (INDERAL) 10 MG tablet Take 1 tablet (10 mg total) by mouth 2 (two) times daily. 60 tablet 2   SUMAtriptan (IMITREX) 100 MG tablet TAKE 1 TABLET BY MOUTH AT THE FIRST SIGN OF MIGRAINE, MAY REPEAT IN 2 HOURS. MAX 2 TABLETS IN 24 HOURS 10 tablet 0   traZODone (DESYREL) 50 MG tablet Take 50 mg by mouth at bedtime.     No facility-administered medications prior to visit.    Allergies  Allergen Reactions   Doxycycline Swelling    Mouth swelling and sores   Codeine Itching   Tetracycline Hcl Swelling    Mouth swelling and sores    ROS Review of Systems A fourteen system review of systems was performed and found to be positive as per HPI.   Objective:    Physical Exam General:  Pleasant and cooperative, in no acute distress  Neuro:  Alert and oriented,  extra-ocular muscles intact, no focal deficits   HEENT:  Normocephalic, atraumatic, neck supple Skin:  no gross rash, warm, pink. Cardiac:  RRR, S1 S2 Respiratory: CTA B/L, Not using accessory muscles, speaking in full sentences- unlabored. Vascular:  Ext warm, no cyanosis apprec. Psych:  No HI/SI, judgement and insight good, Euthymic mood. Full Affect.  BP 123/79   Pulse 100   Temp 97.7 F (36.5 C)   Ht 5' 2" (1.575 m)   Wt 143 lb 8 oz (65.1 kg)   SpO2 100%   BMI 26.25 kg/m  Wt Readings from Last 3 Encounters:  12/07/20 143 lb 8 oz (65.1 kg)  11/23/20 133 lb 14.4 oz (60.7 kg)  10/19/20 145 lb (65.8 kg)     Health Maintenance Due  Topic Date Due   URINE MICROALBUMIN  Never done   MAMMOGRAM  02/28/2019    There are no preventive care reminders to display for this patient.  Lab Results  Component Value Date   TSH 1.405 11/20/2020   Lab Results  Component Value Date   WBC 6.6 12/07/2020   HGB 13.2 12/07/2020   HCT 39.8 12/07/2020   MCV 95 12/07/2020   PLT 265 12/07/2020   Lab Results   Component Value Date   NA 141 12/07/2020   K 4.1 12/07/2020   CO2 22 12/07/2020   GLUCOSE 90 12/07/2020   BUN 11 12/07/2020   CREATININE 0.92 12/07/2020   BILITOT 0.5 12/07/2020   ALKPHOS 70 12/07/2020   AST 20 12/07/2020   ALT 7 12/07/2020   PROT 6.6 12/07/2020   ALBUMIN 4.6 12/07/2020   CALCIUM 9.3 12/07/2020   ANIONGAP 10 11/22/2020   EGFR 69 12/07/2020   Lab Results  Component Value Date   CHOL 199 10/19/2020   Lab Results  Component Value Date   HDL 48 10/19/2020   Lab Results  Component Value Date   LDLCALC 139 (H) 10/19/2020   Lab Results  Component Value Date   TRIG 68 10/19/2020   Lab Results  Component Value Date   CHOLHDL 4.1 10/19/2020   Lab Results  Component Value Date   HGBA1C 4.7 (L) 11/21/2020      Assessment & Plan:   Problem List Items Addressed This Visit       Nervous and Auditory   Alcohol withdrawal ([place])   Relevant Orders   Folate (Completed)   Comp Met (CMET) (Completed)   CBC w/Diff (Completed)     Other   Alcohol use disorder, severe, dependence [facility])   Hospital discharge follow-up - Primary   Other Visit Diagnoses     Low folic acid       Relevant Orders   Folate (Completed)   Need for influenza vaccination       Relevant Orders   Flu Vaccine QUAD High Dose(Fluad) (Completed)      Hospital discharge follow-up, Alcohol use disorder, severe, dependence, Alcohol withdrawal: -Reviewed hospital notes, labs and/or imaging. Patient was previously admitted for alcohol withdrawal from 09/26/2020-09/30/2020. -Patient clinically improving. No signs of DT present at this time. Encourage to continue with avoiding alcohol.  -Patient has completed benzodiazepine therapy, recommend to continue gabapentin for alcohol use disorder.  -Discussed with patient enrolling in outpatient rehabilitation program and plans to reach out. Recommend to resume AA meetings. -Per hospital note, low folic acid level. Will repeat folate. Continue  folic acid supplement. -Will collect CMP to evaluate liver function and CBC to monitor for anemia.  -Continue to follow-up with [facility].   Major depressive disorder, recurrent episode, moderate: -PHQ-9 score of 3, denies SI/HI.  -Continue current medication regimen. -Continue to follow up with [facility].     No orders of the defined types were placed in this encounter.   Follow-up: Return in about 1 month (around 01/07/2021) for [place].    Lorrene Reid, PA-C

## 2020-12-07 NOTE — Patient Instructions (Signed)
Alcohol Withdrawal Syndrome When a person who drinks a lot of alcohol stops drinking, he or she may have unpleasant and serious symptoms. These symptoms are called alcohol withdrawal syndrome. This condition may be mild or severe. It can be life-threatening. This condition can cause: Shaking that you cannot control (tremor). Sweating. Headache. Feeling fearful, upset, grouchy, or depressed. Trouble sleeping (insomnia). Nightmares. Fast or uneven heartbeats (palpitations). Alcohol cravings. Feeling sick to your stomach (nausea). Throwing up (vomiting). Being bothered by light and sounds. Confusion. Trouble thinking clearly. Not being hungry (loss of appetite). Big changes in mood (mood swings). If you have all of the following symptoms at the same time, get help right away: High blood pressure. Fast heartbeat. Trouble breathing. Seizures. Seeing, hearing, feeling, smelling, or tasting things that are not there (hallucinations). These symptoms are known as delirium tremens (DTs). They must be treated at the hospital right away. Follow these instructions at home:  Take over-the-counter and prescription medicines only as told by your doctor. This includes vitamins. Do not drink alcohol. Do not drive until your doctor says that this is safe for you. Have someone stay with you or be available in case you need help. This should be someone you trust. This person can help you with your symptoms. He or she can also help you to not drink. Drink enough fluid to keep your pee (urine) pale yellow. Think about joining a support group or a treatment program to help you stop drinking. Keep all follow-up visits as told by your doctor. This is important. Contact a doctor if: Your symptoms get worse. You cannot eat or drink without throwing up. You have a hard time not drinking alcohol. You cannot stop drinking alcohol. Get help right away if: You have fast or uneven heartbeats. You have chest  pain. You have trouble breathing. You have a seizure for the first time. You see, hear, feel, smell, or taste something that is not there. You get very confused. Summary When a person who drinks a lot of alcohol stops drinking, he or she may have serious symptoms. This is called alcohol withdrawal syndrome. Delirium tremens (DTs) is a group of life-threatening symptoms. You should get help right away if you have these symptoms. Think about joining an alcohol support group or a treatment program. This information is not intended to replace advice given to you by your health care provider. Make sure you discuss any questions you have with your health care provider. Document Revised: 01/06/2020 Document Reviewed: 01/06/2020 Elsevier Patient Education  2022 [employer].

## 2020-12-08 LAB — CBC WITH DIFFERENTIAL/PLATELET
Basophils Absolute: 0.1 10*3/uL (ref 0.0–0.2)
Basos: 1 %
EOS (ABSOLUTE): 0.2 10*3/uL (ref 0.0–0.4)
Eos: 3 %
Hematocrit: 39.8 % (ref 34.0–46.6)
Hemoglobin: 13.2 g/dL (ref 11.1–15.9)
Immature Grans (Abs): 0 10*3/uL (ref 0.0–0.1)
Immature Granulocytes: 0 %
Lymphocytes Absolute: 1.1 10*3/uL (ref 0.7–3.1)
Lymphs: 17 %
MCH: 31.5 pg (ref 26.6–33.0)
MCHC: 33.2 g/dL (ref 31.5–35.7)
MCV: 95 fL (ref 79–97)
Monocytes Absolute: 0.7 10*3/uL (ref 0.1–0.9)
Monocytes: 11 %
Neutrophils Absolute: 4.5 10*3/uL (ref 1.4–7.0)
Neutrophils: 68 %
Platelets: 265 10*3/uL (ref 150–450)
RBC: 4.19 x10E6/uL (ref 3.77–5.28)
RDW: 13.9 % (ref 11.7–15.4)
WBC: 6.6 10*3/uL (ref 3.4–10.8)

## 2020-12-08 LAB — COMPREHENSIVE METABOLIC PANEL
ALT: 7 IU/L (ref 0–32)
AST: 20 IU/L (ref 0–40)
Albumin/Globulin Ratio: 2.3 — ABNORMAL HIGH (ref 1.2–2.2)
Albumin: 4.6 g/dL (ref 3.8–4.8)
Alkaline Phosphatase: 70 IU/L (ref 44–121)
BUN/Creatinine Ratio: 12 (ref 12–28)
BUN: 11 mg/dL (ref 8–27)
Bilirubin Total: 0.5 mg/dL (ref 0.0–1.2)
CO2: 22 mmol/L (ref 20–29)
Calcium: 9.3 mg/dL (ref 8.7–10.3)
Chloride: 102 mmol/L (ref 96–106)
Creatinine, Ser: 0.92 mg/dL (ref 0.57–1.00)
Globulin, Total: 2 g/dL (ref 1.5–4.5)
Glucose: 90 mg/dL (ref 70–99)
Potassium: 4.1 mmol/L (ref 3.5–5.2)
Sodium: 141 mmol/L (ref 134–144)
Total Protein: 6.6 g/dL (ref 6.0–8.5)
eGFR: 69 mL/min/{1.73_m2} (ref >59)

## 2020-12-08 LAB — FOLATE: Folate: >20 ng/mL (ref >3.0)

## 2020-12-11 ENCOUNTER — Other Ambulatory Visit: Payer: Self-pay | Admitting: Nurse Practitioner

## 2020-12-11 ENCOUNTER — Telehealth: Payer: Self-pay | Admitting: Physician Assistant

## 2020-12-11 ENCOUNTER — Other Ambulatory Visit: Payer: Self-pay | Admitting: Physician Assistant

## 2020-12-11 DIAGNOSIS — F10930 Alcohol use, unspecified with withdrawal, uncomplicated: Secondary | ICD-10-CM

## 2020-12-11 MED ORDER — DIAZEPAM 5 MG PO TABS: ORAL_TABLET | ORAL | 0 refills | Status: DC

## 2020-12-11 NOTE — Telephone Encounter (Signed)
Patient called into office to get a medication refill on valium.  [name] and [name] had a conversation about if she started having withdraw symptoms, [name] would call in a refill on her valium to get her through it.  Her symptoms started last night, shaking, sweating and nausea.   diazepam (VALIUM) 5 MG tablet [[id number]]    Order Details Dose, Route, Frequency: As Directed  Dispense Quantity: 6 tablet Refills: 0        Sig: 1 tab two times a day for 2 days 1 tab daily for 2 days       Start Date: 11/23/20 End Date: --  Written Date: 11/23/20 Expiration Date: 05/22/21  Providers  Ordering and Authorizing Provider:   Barb Merino, [address] [employer], [place] [address]  Phone:  [phone]   Fax:  [phone]  DEA #:  [id number]   NPI:  [id number]

## 2020-12-11 NOTE — Telephone Encounter (Signed)
Terri Ayala,  I renewed valium prescription per initial directions and sent to [facility]   Thanks so much.   -HB

## 2020-12-11 NOTE — Progress Notes (Signed)
Renewed valium prescription per initial directions and sent to [facility]

## 2020-12-21 ENCOUNTER — Other Ambulatory Visit: Payer: Self-pay | Admitting: Physician Assistant

## 2021-01-08 ENCOUNTER — Other Ambulatory Visit: Payer: Self-pay | Admitting: Physician Assistant

## 2021-01-26 ENCOUNTER — Other Ambulatory Visit: Payer: Self-pay | Admitting: Physician Assistant

## 2021-01-26 DIAGNOSIS — I10 Essential (primary) hypertension: Secondary | ICD-10-CM

## 2021-02-10 ENCOUNTER — Encounter (HOSPITAL_COMMUNITY): Payer: Self-pay | Admitting: *Deleted

## 2021-02-10 ENCOUNTER — Emergency Department (HOSPITAL_COMMUNITY): Payer: Medicare Other

## 2021-02-10 ENCOUNTER — Inpatient Hospital Stay (HOSPITAL_COMMUNITY)
Admission: EM | Admit: 2021-02-10 | Discharge: 2021-02-16 | DRG: 982 | Disposition: A | Discharge status: Skilled Nursing Facility | Payer: Medicare Other | Attending: Internal Medicine | Admitting: Internal Medicine

## 2021-02-10 ENCOUNTER — Other Ambulatory Visit: Payer: Self-pay

## 2021-02-10 DIAGNOSIS — M810 Age-related osteoporosis without current pathological fracture: Secondary | ICD-10-CM | POA: Diagnosis present

## 2021-02-10 DIAGNOSIS — F419 Anxiety disorder, unspecified: Secondary | ICD-10-CM | POA: Diagnosis present

## 2021-02-10 DIAGNOSIS — Z91199 Patient's noncompliance with other medical treatment and regimen due to unspecified reason: Secondary | ICD-10-CM

## 2021-02-10 DIAGNOSIS — F10939 Alcohol use, unspecified with withdrawal, unspecified: Secondary | ICD-10-CM

## 2021-02-10 DIAGNOSIS — F10239 Alcohol dependence with withdrawal, unspecified: Secondary | ICD-10-CM | POA: Diagnosis not present

## 2021-02-10 DIAGNOSIS — Z7901 Long term (current) use of anticoagulants: Secondary | ICD-10-CM

## 2021-02-10 DIAGNOSIS — Z881 Allergy status to other antibiotic agents status: Secondary | ICD-10-CM

## 2021-02-10 DIAGNOSIS — G4700 Insomnia, unspecified: Secondary | ICD-10-CM | POA: Diagnosis present

## 2021-02-10 DIAGNOSIS — F32A Depression, unspecified: Secondary | ICD-10-CM | POA: Diagnosis not present

## 2021-02-10 DIAGNOSIS — Z8262 Family history of osteoporosis: Secondary | ICD-10-CM | POA: Diagnosis not present

## 2021-02-10 DIAGNOSIS — S82892A Other fracture of left lower leg, initial encounter for closed fracture: Secondary | ICD-10-CM

## 2021-02-10 DIAGNOSIS — Z20822 Contact with and (suspected) exposure to covid-19: Secondary | ICD-10-CM | POA: Diagnosis present

## 2021-02-10 DIAGNOSIS — T148XXA Other injury of unspecified body region, initial encounter: Secondary | ICD-10-CM | POA: Diagnosis not present

## 2021-02-10 DIAGNOSIS — M6282 Rhabdomyolysis: Secondary | ICD-10-CM | POA: Diagnosis not present

## 2021-02-10 DIAGNOSIS — Z87891 Personal history of nicotine dependence: Secondary | ICD-10-CM

## 2021-02-10 DIAGNOSIS — K700 Alcoholic fatty liver: Secondary | ICD-10-CM | POA: Diagnosis not present

## 2021-02-10 DIAGNOSIS — Y92009 Unspecified place in unspecified non-institutional (private) residence as the place of occurrence of the external cause: Secondary | ICD-10-CM

## 2021-02-10 DIAGNOSIS — W010XXA Fall on same level from slipping, tripping and stumbling without subsequent striking against object, initial encounter: Secondary | ICD-10-CM | POA: Diagnosis present

## 2021-02-10 DIAGNOSIS — M898X9 Other specified disorders of bone, unspecified site: Secondary | ICD-10-CM | POA: Diagnosis present

## 2021-02-10 DIAGNOSIS — I341 Nonrheumatic mitral (valve) prolapse: Secondary | ICD-10-CM | POA: Diagnosis not present

## 2021-02-10 DIAGNOSIS — E876 Hypokalemia: Secondary | ICD-10-CM | POA: Diagnosis not present

## 2021-02-10 DIAGNOSIS — Z888 Allergy status to other drugs, medicaments and biological substances status: Secondary | ICD-10-CM

## 2021-02-10 DIAGNOSIS — G8929 Other chronic pain: Secondary | ICD-10-CM | POA: Diagnosis present

## 2021-02-10 DIAGNOSIS — E041 Nontoxic single thyroid nodule: Secondary | ICD-10-CM

## 2021-02-10 DIAGNOSIS — R9431 Abnormal electrocardiogram [ECG] [EKG]: Secondary | ICD-10-CM

## 2021-02-10 DIAGNOSIS — D62 Acute posthemorrhagic anemia: Secondary | ICD-10-CM | POA: Diagnosis not present

## 2021-02-10 DIAGNOSIS — Z9049 Acquired absence of other specified parts of digestive tract: Secondary | ICD-10-CM

## 2021-02-10 DIAGNOSIS — E785 Hyperlipidemia, unspecified: Secondary | ICD-10-CM | POA: Diagnosis present

## 2021-02-10 DIAGNOSIS — I10 Essential (primary) hypertension: Secondary | ICD-10-CM | POA: Diagnosis present

## 2021-02-10 DIAGNOSIS — W19XXXA Unspecified fall, initial encounter: Secondary | ICD-10-CM

## 2021-02-10 DIAGNOSIS — S93439A Sprain of tibiofibular ligament of unspecified ankle, initial encounter: Secondary | ICD-10-CM | POA: Diagnosis present

## 2021-02-10 DIAGNOSIS — S2232XA Fracture of one rib, left side, initial encounter for closed fracture: Secondary | ICD-10-CM | POA: Diagnosis present

## 2021-02-10 DIAGNOSIS — F10930 Alcohol use, unspecified with withdrawal, uncomplicated: Secondary | ICD-10-CM | POA: Diagnosis not present

## 2021-02-10 DIAGNOSIS — Z800 Family history of malignant neoplasm of digestive organs: Secondary | ICD-10-CM | POA: Diagnosis not present

## 2021-02-10 DIAGNOSIS — Z885 Allergy status to narcotic agent status: Secondary | ICD-10-CM

## 2021-02-10 DIAGNOSIS — Z9841 Cataract extraction status, right eye: Secondary | ICD-10-CM

## 2021-02-10 DIAGNOSIS — Z9842 Cataract extraction status, left eye: Secondary | ICD-10-CM

## 2021-02-10 DIAGNOSIS — S82852A Displaced trimalleolar fracture of left lower leg, initial encounter for closed fracture: Secondary | ICD-10-CM | POA: Diagnosis present

## 2021-02-10 DIAGNOSIS — S32019A Unspecified fracture of first lumbar vertebra, initial encounter for closed fracture: Secondary | ICD-10-CM | POA: Diagnosis not present

## 2021-02-10 DIAGNOSIS — R748 Abnormal levels of other serum enzymes: Secondary | ICD-10-CM | POA: Diagnosis present

## 2021-02-10 DIAGNOSIS — K219 Gastro-esophageal reflux disease without esophagitis: Secondary | ICD-10-CM | POA: Diagnosis present

## 2021-02-10 DIAGNOSIS — Z780 Asymptomatic menopausal state: Secondary | ICD-10-CM

## 2021-02-10 DIAGNOSIS — Z419 Encounter for procedure for purposes other than remedying health state, unspecified: Secondary | ICD-10-CM

## 2021-02-10 DIAGNOSIS — Z961 Presence of intraocular lens: Secondary | ICD-10-CM | POA: Diagnosis present

## 2021-02-10 DIAGNOSIS — Z791 Long term (current) use of non-steroidal anti-inflammatories (NSAID): Secondary | ICD-10-CM

## 2021-02-10 DIAGNOSIS — Z79899 Other long term (current) drug therapy: Secondary | ICD-10-CM

## 2021-02-10 DIAGNOSIS — M25572 Pain in left ankle and joints of left foot: Secondary | ICD-10-CM | POA: Diagnosis not present

## 2021-02-10 LAB — CBC WITH DIFFERENTIAL/PLATELET
Abs Immature Granulocytes: 0.04 10*3/uL (ref 0.00–0.07)
Basophils Absolute: 0 10*3/uL (ref 0.0–0.1)
Basophils Relative: 0 %
Eosinophils Absolute: 0 10*3/uL (ref 0.0–0.5)
Eosinophils Relative: 0 %
HCT: 36.6 % (ref 36.0–46.0)
Hemoglobin: 12.1 g/dL (ref 12.0–15.0)
Immature Granulocytes: 0 %
Lymphocytes Relative: 4 %
Lymphs Abs: 0.6 10*3/uL — ABNORMAL LOW (ref 0.7–4.0)
MCH: 31.9 pg (ref 26.0–34.0)
MCHC: 33.1 g/dL (ref 30.0–36.0)
MCV: 96.6 fL (ref 80.0–100.0)
Monocytes Absolute: 0.8 10*3/uL (ref 0.1–1.0)
Monocytes Relative: 6 %
Neutro Abs: 12 10*3/uL — ABNORMAL HIGH (ref 1.7–7.7)
Neutrophils Relative %: 90 %
Platelets: 123 10*3/uL — ABNORMAL LOW (ref 150–400)
RBC: 3.79 MIL/uL — ABNORMAL LOW (ref 3.87–5.11)
RDW: 12.9 % (ref 11.5–15.5)
WBC: 13.5 10*3/uL — ABNORMAL HIGH (ref 4.0–10.5)
nRBC: 0 % (ref 0.0–0.2)

## 2021-02-10 LAB — COMPREHENSIVE METABOLIC PANEL
ALT: 19 U/L (ref 0–44)
AST: 50 U/L — ABNORMAL HIGH (ref 15–41)
Albumin: 4.3 g/dL (ref 3.5–5.0)
Alkaline Phosphatase: 64 U/L (ref 38–126)
Anion gap: 20 — ABNORMAL HIGH (ref 5–15)
BUN: 14 mg/dL (ref 8–23)
CO2: 15 mmol/L — ABNORMAL LOW (ref 22–32)
Calcium: 8.9 mg/dL (ref 8.9–10.3)
Chloride: 101 mmol/L (ref 98–111)
Creatinine, Ser: 1.05 mg/dL — ABNORMAL HIGH (ref 0.44–1.00)
GFR, Estimated: 59 mL/min — ABNORMAL LOW (ref >60)
Glucose, Bld: 125 mg/dL — ABNORMAL HIGH (ref 70–99)
Potassium: 3.6 mmol/L (ref 3.5–5.1)
Sodium: 136 mmol/L (ref 135–145)
Total Bilirubin: 1.9 mg/dL — ABNORMAL HIGH (ref 0.3–1.2)
Total Protein: 7.9 g/dL (ref 6.5–8.1)

## 2021-02-10 LAB — CK: Total CK: 662 U/L — ABNORMAL HIGH (ref 38–234)

## 2021-02-10 LAB — I-STAT CHEM 8, ED
BUN: 16 mg/dL (ref 8–23)
Calcium, Ion: 1.15 mmol/L (ref 1.15–1.40)
Chloride: 105 mmol/L (ref 98–111)
Creatinine, Ser: 0.6 mg/dL (ref 0.44–1.00)
Glucose, Bld: 123 mg/dL — ABNORMAL HIGH (ref 70–99)
HCT: 41 % (ref 36.0–46.0)
Hemoglobin: 13.9 g/dL (ref 12.0–15.0)
Potassium: 3.8 mmol/L (ref 3.5–5.1)
Sodium: 138 mmol/L (ref 135–145)
TCO2: 16 mmol/L — ABNORMAL LOW (ref 22–32)

## 2021-02-10 LAB — TROPONIN I (HIGH SENSITIVITY): Troponin I (High Sensitivity): 10 ng/L (ref <18)

## 2021-02-10 LAB — ETHANOL: Alcohol, Ethyl (B): <10 mg/dL (ref <10)

## 2021-02-10 MED ORDER — LORAZEPAM 1 MG PO TABS
1.0000 mg | ORAL_TABLET | ORAL | Status: DC | PRN
  Administered 2021-02-10: 2 mg via ORAL
  Filled 2021-02-10: qty 2

## 2021-02-10 MED ORDER — MORPHINE SULFATE (PF) 4 MG/ML IV SOLN
4.0000 mg | Freq: Once | INTRAVENOUS | Status: AC
  Administered 2021-02-11: 4 mg via INTRAVENOUS
  Filled 2021-02-10: qty 1

## 2021-02-10 MED ORDER — LORAZEPAM 2 MG/ML IJ SOLN: 1.0000 mg | INTRAMUSCULAR | Status: DC | PRN

## 2021-02-10 MED ORDER — THIAMINE HCL 100 MG PO TABS
100.0000 mg | ORAL_TABLET | Freq: Every day | ORAL | Status: DC
  Administered 2021-02-10: 23:00:00 100 mg via ORAL
  Filled 2021-02-10: qty 1

## 2021-02-10 MED ORDER — FOLIC ACID 1 MG PO TABS
1.0000 mg | ORAL_TABLET | Freq: Every day | ORAL | Status: DC
  Administered 2021-02-10: 23:00:00 1 mg via ORAL
  Filled 2021-02-10: qty 1

## 2021-02-10 MED ORDER — THIAMINE HCL 100 MG/ML IJ SOLN
100.0000 mg | Freq: Every day | INTRAMUSCULAR | Status: DC
  Filled 2021-02-10: qty 2

## 2021-02-10 MED ORDER — ADULT MULTIVITAMIN W/MINERALS CH
1.0000 | ORAL_TABLET | Freq: Every day | ORAL | Status: DC
  Administered 2021-02-10: 23:00:00 1 via ORAL
  Filled 2021-02-10: qty 1

## 2021-02-10 MED ORDER — SODIUM CHLORIDE 0.9 % IV BOLUS
1000.0000 mL | Freq: Once | INTRAVENOUS | Status: AC
  Administered 2021-02-11: 1000 mL via INTRAVENOUS

## 2021-02-10 MED ORDER — LORAZEPAM 2 MG/ML IJ SOLN
0.0000 mg | INTRAMUSCULAR | Status: AC
  Administered 2021-02-11: 2 mg via INTRAVENOUS
  Administered 2021-02-11: 1 mg via INTRAVENOUS
  Administered 2021-02-11 – 2021-02-12 (×2): 2 mg via INTRAVENOUS
  Filled 2021-02-10: qty 1
  Filled 2021-02-10: qty 2
  Filled 2021-02-10 (×2): qty 1

## 2021-02-10 MED ORDER — LORAZEPAM 2 MG/ML IJ SOLN
0.0000 mg | Freq: Three times a day (TID) | INTRAMUSCULAR | Status: AC
  Administered 2021-02-13 (×2): 2 mg via INTRAVENOUS
  Filled 2021-02-10 (×2): qty 1

## 2021-02-10 NOTE — ED Triage Notes (Signed)
Pt from [facility] from home. Family lives out of state and requested a welfare check. Pt was found lying on the floor. Family reported trying to get in touch with pt for 3-4 days, pt reported being on the floor for 2 hours. Pt covered in urine and feces. L ankle bruising from previous fall 2 weeks ago. Reports she was trying to use her walker but lost her balance and fell to the floor. Admits to drinking 2 bottles of wine, last drink "a week ago". Tremors noted

## 2021-02-10 NOTE — ED Provider Notes (Signed)
[facility] EMERGENCY DEPARTMENT Provider Note   CSN: [id number] Arrival date & time: 02/10/21  2039     History Chief Complaint  Patient presents with   Altered Mental Status    Terri Ayala is a 65 y.o. female who presents to the ED after being found lying on the floor for an unknown length of time.  The patient states that she sprained her left ankle 2 weeks ago after falling.  She is able to walk on it while using her walker.  She states that she drinks about 2-3 bottles of wine a day and is currently "going through withdrawals" because she stopped drinking about a week ago.  She notes that during her prior fall several days ago, she was on the floor for a few hours.  No other complaints or concerns today.   Altered Mental Status     Past Medical History:  Diagnosis Date   Alcohol withdrawal ([place]) 11/2017; 01/04/2018   Alcoholism ([place])    Anxiety    Arthritis    "hands" (01/04/2018)   ASYMPTOMATIC POSTMENOPAUSAL STATUS 08/17/2009   Chronic lower back pain    Depression    DYSPHAGIA 06/19/2007   Fatty liver, alcoholic    GERD (gastroesophageal reflux disease)    GOITER, MULTINODULAR 08/17/2009   Hyperlipidemia    Migraines    "not sure what triggers them; I'll have 1 q couple weeks or more; come 2 days in a row when they come" (01/04/2018)   Mitral valve prolapse    OSTEOPOROSIS 08/17/2009   PONV (postoperative nausea and vomiting)    Supraventricular tachycardia [facility])     Patient Active Problem List   Diagnosis Date Noted   Elevated cholesterol 10/19/2020   Pedal edema 10/19/2020   History of colonic polyps 03/25/2020   Hemorrhage of rectum and anus 03/25/2020   Gastroparesis 03/25/2020   Gastroesophageal reflux disease 03/25/2020   Flatulence, eructation and gas pain 03/25/2020   Family history of malignant neoplasm of gastrointestinal tract 13/24/4010   Eosinophilic esophagitis 27/25/3664   Constipation 03/25/2020   Change in bowel habit  03/25/2020   Hospital discharge follow-up 40/34/7425   Metabolic acidosis 95/63/8756   Abnormal liver function 02/06/2019   Alcohol withdrawal delirium ([place]) 02/06/2019   Elevated LFTs 07/12/2018   Healthcare maintenance 06/26/2018   Rib fracture 01/04/2018   Chest pain 12/08/2017   Leukocytosis 12/08/2017   Essential hypertension 12/08/2017   Alcohol withdrawal ([place]) 07/21/2017   Lower urinary tract infectious disease 07/21/2017   Osteopenia 08/27/2016   Alcohol dependence with withdrawal ([place]) 01/27/2016   Generalized anxiety disorder 01/27/2016   Severe episode of recurrent major depressive disorder, without psychotic features ([place]) 01/27/2016   Major depressive disorder, recurrent episode, moderate (HCC)    Alcohol use disorder, severe, dependence ([place]) 04/03/2014   Lumbosacral radiculopathy at L5 05/20/2013   Nonspecific elevation of levels of transaminase or lactic acid dehydrogenase (LDH) 03/16/2013   Hepatic steatosis 03/16/2013   Nausea and vomiting 03/16/2013   Chronic cholecystitis 03/14/2013   Cervical arthritis 10/12/2012   Palpitations 11/29/2010   SVT (supraventricular tachycardia) ([place]) 11/29/2010   Mitral valve prolapse 11/29/2010   Decreased libido 11/15/2010   GOITER, MULTINODULAR 08/17/2009   ANXIETY 08/17/2009   Migraine headache 08/17/2009   Hearing loss 08/17/2009   Osteoporosis 08/17/2009   ASYMPTOMATIC POSTMENOPAUSAL STATUS 08/17/2009   DYSPHAGIA 06/19/2007    Past Surgical History:  Procedure Laterality Date   BREAST BIOPSY Right    "benign"   CATARACT  EXTRACTION W/ INTRAOCULAR LENS  IMPLANT, BILATERAL     CERVICAL CONE BIOPSY  2000s   CERVIX LESION DESTRUCTION  1991   "dysplasia; lesions"   CHOLECYSTECTOMY N/A 03/14/2013   Procedure: LAPAROSCOPIC CHOLECYSTECTOMY WITH ATTEMPTED INTRAOPERATIVE CHOLANGIOGRAM;  Surgeon: Harl Bowie, MD;  Location: [place];  Service: General;  Laterality: N/A;   laporoscopic abdominal surgery     "for  endometriosis"   LEFT HEART CATH AND CORONARY ANGIOGRAPHY N/A 05/07/2018   Procedure: LEFT HEART CATH AND CORONARY ANGIOGRAPHY;  Surgeon: Belva Crome, MD;  Location: [place] CV LAB;  Service: Cardiovascular;  Laterality: N/A;     OB History   No obstetric history on file.     Family History  Problem Relation Age of Onset   Colon cancer Father        Deceased, 11   Osteoporosis Mother        Living, 11   Healthy Brother    Healthy Brother    Healthy Son    Healthy Son    Thyroid disease Neg Hx    Goiter Neg Hx     Social History   Tobacco Use   Smoking status: Former    Packs/day: 1.00    Years: 7.00    Pack years: 7.00    Types: Cigarettes    Quit date: 02/28/1974    Years since quitting: 46.9   Smokeless tobacco: Never   Tobacco comments:    01/04/2018 "smoked when I was a teenager"  Vaping Use   Vaping Use: Never used  Substance Use Topics   Alcohol use: Yes    Alcohol/week: 28.0 standard drinks    Types: 28 Glasses of wine per week    Comment: 01/04/2018 "1 1/2 bottles of wine/day"   Drug use: Never    Home Medications Prior to Admission medications   Medication Sig Start Date End Date Taking? Authorizing Provider  acetaminophen (TYLENOL) 325 MG tablet Take 2 tablets (650 mg total) by mouth every 6 (six) hours as needed for moderate pain. 09/30/20   Nita Sells, MD  albuterol (VENTOLIN HFA) 108 (90 Base) MCG/ACT inhaler Inhale 2 puffs into the lungs every 6 (six) hours as needed for wheezing or shortness of breath. 10/19/20   Ronnell Freshwater, NP  alendronate (FOSAMAX) 70 MG tablet Take 70 mg by mouth every Monday. Take with a full glass of water on an empty stomach.    [provider]  busPIRone (BUSPAR) 15 MG tablet Take 15 mg by mouth 3 (three) times daily.    [provider]  diazepam (VALIUM) 5 MG tablet 1 tab two times a day for 2 days 1 tab daily for 2 days 12/11/20   Ronnell Freshwater, NP  DULoxetine (CYMBALTA) 60 MG capsule  TAKE 1 CAPSULE (60 MG TOTAL) BY MOUTH DAILY. Patient taking differently: Take 60 mg by mouth daily. 10/23/14   Denita Lung, MD  folic acid (FOLVITE) 1 MG tablet Take 1 tablet (1 mg total) by mouth daily. 11/23/20 02/21/21  Barb Merino, MD  gabapentin (NEURONTIN) 100 MG capsule Take 1 capsule (100 mg total) by mouth 3 (three) times daily. 11/23/20 12/23/20  Barb Merino, MD  hydrOXYzine (ATARAX/VISTARIL) 25 MG tablet Take 25 mg by mouth 3 (three) times daily.    [provider]  meloxicam (MOBIC) 15 MG tablet Take 15 mg by mouth daily. 11/30/20   [provider]  Multiple Vitamin (MULTIVITAMIN WITH MINERALS) TABS tablet Take 1 tablet by mouth  daily. 01/10/18   Cristal Ford, DO  norethindrone-ethinyl estradiol (FEMHRT 1/5) 1-5 MG-MCG TABS Take 1 tablet by mouth daily. For Osteoporosis 04/07/14   Lindell Spar I, NP  pantoprazole (PROTONIX) 40 MG tablet TAKE 1 TABLET BY MOUTH 2 TIMES DAILY. 01/11/21   Abonza, [name], PA-C  propranolol (INDERAL) 10 MG tablet TAKE 1 TABLET BY MOUTH 2 TIMES DAILY. 01/26/21   Abonza, [name], PA-C  SUMAtriptan (IMITREX) 100 MG tablet TAKE 1 TABLET BY MOUTH AT THE FIRST SIGN OF MIGRAINE, MAY REPEAT IN 2 HOURS. MAX 2 TABLETS IN 24 HOURS 11/30/20   Abonza, Maritza, PA-C  traZODone (DESYREL) 50 MG tablet Take 50 mg by mouth at bedtime.    [provider]    Allergies    Doxycycline, Codeine, and Tetracycline hcl  Review of Systems   Review of Systems  Constitutional: Negative.   HENT: Negative.    Eyes: Negative.   Respiratory: Negative.    Cardiovascular: Negative.   Gastrointestinal: Negative.   Endocrine: Negative.   Genitourinary: Negative.   Musculoskeletal:        Left ankle pain after a sprain 2 weeks ago  Skin: Negative.   Allergic/Immunologic: Negative.   Neurological:  Positive for tremors.  Hematological: Negative.    Physical Exam Updated Vital Signs BP (!) 117/98    Pulse (!) 105    Temp 98.1 F (36.7 C) (Oral)     Resp 17    SpO2 99%   Physical Exam Constitutional:      Appearance: She is ill-appearing.     Comments: Tremulous  HENT:     Head: Normocephalic and atraumatic.  Eyes:     Extraocular Movements: Extraocular movements intact.     Pupils: Pupils are equal, round, and reactive to light.  Cardiovascular:     Rate and Rhythm: Regular rhythm. Tachycardia present.  Pulmonary:     Effort: Pulmonary effort is normal.     Breath sounds: Normal breath sounds.  Abdominal:     General: Abdomen is flat. There is no distension.  Musculoskeletal:     Comments: The left ankle is swollen and ecchymotic.  Skin:    General: Skin is warm and dry.     Comments: Dried feces over the left buttock region  Neurological:     Mental Status: She is alert and oriented to person, place, and time.  Psychiatric:        Mood and Affect: Mood normal.        Behavior: Behavior normal.    ED Results / Procedures / Treatments   Labs (all labs ordered are listed, but only abnormal results are displayed) Labs Reviewed  CBC WITH DIFFERENTIAL/PLATELET - Abnormal; Notable for the following components:      Result Value   WBC 13.5 (*)    RBC 3.79 (*)    Platelets 123 (*)    Neutro Abs 12.0 (*)    Lymphs Abs 0.6 (*)    All other components within normal limits  COMPREHENSIVE METABOLIC PANEL - Abnormal; Notable for the following components:   CO2 15 (*)    Glucose, Bld 125 (*)    Creatinine, Ser 1.05 (*)    AST 50 (*)    Total Bilirubin 1.9 (*)    GFR, Estimated 59 (*)    Anion gap 20 (*)    All other components within normal limits  CK - Abnormal; Notable for the following components:   Total CK 662 (*)    All other components  within normal limits  I-STAT CHEM 8, ED - Abnormal; Notable for the following components:   Glucose, Bld 123 (*)    TCO2 16 (*)    All other components within normal limits  RESP PANEL BY RT-PCR (FLU A&B, COVID) ARPGX2  ETHANOL  TROPONIN I (HIGH SENSITIVITY)  TROPONIN I  (HIGH SENSITIVITY)    EKG EKG Interpretation  Date/Time:  Wednesday February 10 2021 20:54:47 EST Ventricular Rate:  100 PR Interval:  131 QRS Duration: 89 QT Interval:  381 QTC Calculation: 492 R Axis:   42 Text Interpretation: Sinus tachycardia Borderline prolonged QT interval No significant change since last tracing Confirmed by Wandra Arthurs [phone]) on 02/10/2021 9:40:35 PM  Radiology CT HEAD WO CONTRAST (5MM)  Result Date: 02/10/2021 CLINICAL DATA:  Head trauma found lying on floor EXAM: CT HEAD WITHOUT CONTRAST CT CERVICAL SPINE WITHOUT CONTRAST TECHNIQUE: Multidetector CT imaging of the head and cervical spine was performed following the standard protocol without intravenous contrast. Multiplanar CT image reconstructions of the cervical spine were also generated. COMPARISON:  CT brain and cervical spine 01/30/2018 FINDINGS: CT HEAD FINDINGS Brain: No acute territorial infarction, hemorrhage or intracranial mass. Stable left cerebellar calcification. Moderate atrophy. Mild chronic small vessel ischemic changes of the white matter. Stable ventricle size Vascular: No hyperdense vessels.  No unexpected calcification Skull: Normal. Negative for fracture or focal lesion. Sinuses/Orbits: No acute finding. Other: None CT CERVICAL SPINE FINDINGS Alignment: No subluxation.  Facet alignment within normal limits Skull base and vertebrae: No acute fracture. No primary bone lesion or focal pathologic process. Soft tissues and spinal canal: No prevertebral fluid or swelling. No visible canal hematoma. Disc levels: Multilevel degenerative change. Moderate disc space narrowing C4-C5. Advanced degenerative changes C6-C7. Hypertrophic facet degenerative changes at multiple levels. Upper chest: Lung apices are clear. 1.9 cm hypodense nodule right lobe of thyroid and 17 mm hypodense nodule left lobe of thyroid. Other: None IMPRESSION: 1. No CT evidence for acute intracranial abnormality. Stable atrophy. 2.  Degenerative changes of the cervical spine. No acute osseous abnormality 3. 1.9 cm hypodense nodule right lobe of thyroid. Recommend thyroid US (ref: J Am Coll Radiol. 2015 Feb;12(2): 143-50).This may be performed non emergently Electronically Signed   By: Donavan Foil M.D.   On: 02/10/2021 22:39   CT Cervical Spine Wo Contrast  Result Date: 02/10/2021 CLINICAL DATA:  Head trauma found lying on floor EXAM: CT HEAD WITHOUT CONTRAST CT CERVICAL SPINE WITHOUT CONTRAST TECHNIQUE: Multidetector CT imaging of the head and cervical spine was performed following the standard protocol without intravenous contrast. Multiplanar CT image reconstructions of the cervical spine were also generated. COMPARISON:  CT brain and cervical spine 01/30/2018 FINDINGS: CT HEAD FINDINGS Brain: No acute territorial infarction, hemorrhage or intracranial mass. Stable left cerebellar calcification. Moderate atrophy. Mild chronic small vessel ischemic changes of the white matter. Stable ventricle size Vascular: No hyperdense vessels.  No unexpected calcification Skull: Normal. Negative for fracture or focal lesion. Sinuses/Orbits: No acute finding. Other: None CT CERVICAL SPINE FINDINGS Alignment: No subluxation.  Facet alignment within normal limits Skull base and vertebrae: No acute fracture. No primary bone lesion or focal pathologic process. Soft tissues and spinal canal: No prevertebral fluid or swelling. No visible canal hematoma. Disc levels: Multilevel degenerative change. Moderate disc space narrowing C4-C5. Advanced degenerative changes C6-C7. Hypertrophic facet degenerative changes at multiple levels. Upper chest: Lung apices are clear. 1.9 cm hypodense nodule right lobe of thyroid and 17 mm hypodense nodule left lobe of thyroid.  Other: None IMPRESSION: 1. No CT evidence for acute intracranial abnormality. Stable atrophy. 2. Degenerative changes of the cervical spine. No acute osseous abnormality 3. 1.9 cm hypodense nodule right  lobe of thyroid. Recommend thyroid US (ref: J Am Coll Radiol. 2015 Feb;12(2): 143-50).This may be performed non emergently Electronically Signed   By: Donavan Foil M.D.   On: 02/10/2021 22:39   CT Lumbar Spine Wo Contrast  Result Date: 02/10/2021 CLINICAL DATA:  Found on floor EXAM: CT LUMBAR SPINE WITHOUT CONTRAST TECHNIQUE: Multidetector CT imaging of the lumbar spine was performed without intravenous contrast administration. Multiplanar CT image reconstructions were also generated. COMPARISON:  CT 09/26/2020 FINDINGS: Segmentation: 5 lumbar type vertebrae. Alignment: Normal. Vertebrae: Vertebral body heights appear normal. Acute appearing left transverse process fracture at L1. Paraspinal and other soft tissues: No paravertebral or paraspinal soft tissue abnormality is evident. Aortic atherosclerosis. Acute mildly displaced left twelfth posteromedial rib fracture. Disc levels: At T12-L1, maintained disc space. No canal stenosis. Foramen are patent. At L1-L2, maintained disc space. No canal stenosis. The foramen are patent. At L2-L3, mild disc space narrowing. No canal stenosis. The foramen are patent bilaterally. At L3-L4, maintained disc space. Left foraminal and lateral disc protrusion. No significant foraminal narrowing. No canal stenosis. Mild facet degenerative changes. At L4-L5, maintained disc space. No canal stenosis. Mild facet degenerative changes. The foramen are patent bilaterally. At L5-S1, mild to moderate disc space narrowing. Posterior disc osteophyte complex. No canal stenosis. Facet degenerative changes. The foramen are patent bilaterally. IMPRESSION: 1. Acute appearing mildly displaced left transverse process fracture at L1. Adjacent acute mildly displaced left twelfth posteromedial rib fracture 2. Minimal degenerative changes. Electronically Signed   By: Donavan Foil M.D.   On: 02/10/2021 22:46   DG Pelvis Portable  Result Date: 02/10/2021 CLINICAL DATA:  Fall EXAM: PORTABLE PELVIS  1-2 VIEWS COMPARISON:  None. FINDINGS: Hip joints and SI joints are symmetric. No acute bony abnormality. Specifically, no fracture, subluxation, or dislocation. IMPRESSION: Negative. Electronically Signed   By: Rolm Baptise M.D.   On: 02/10/2021 21:40   DG Chest Port 1 View  Result Date: 02/10/2021 CLINICAL DATA:  Found on floor. EXAM: PORTABLE CHEST 1 VIEW COMPARISON:  11/20/2020 FINDINGS: Heart and mediastinal contours are within normal limits. No focal opacities or effusions. No acute bony abnormality. IMPRESSION: No active disease. Electronically Signed   By: Rolm Baptise M.D.   On: 02/10/2021 21:39   DG Ankle Left Port  Result Date: 02/10/2021 CLINICAL DATA:  Found on floor EXAM: PORTABLE LEFT ANKLE - 2 VIEW COMPARISON:  None. FINDINGS: Bimalleolar fracture with oblique fracture through the distal fibular metaphysis and transverse fracture through the medial malleolus. Widening of the medial ankle mortise. No dislocation. Diffuse soft tissue swelling. IMPRESSION: Bimalleolar fracture with widening of the medial ankle mortise. Electronically Signed   By: Rolm Baptise M.D.   On: 02/10/2021 21:40    Procedures Procedures   Medications Ordered in ED Medications  sodium chloride 0.9 % bolus 1,000 mL (has no administration in time range)  LORazepam (ATIVAN) tablet 1-4 mg (2 mg Oral Given 02/10/21 2252)    Or  LORazepam (ATIVAN) injection 1-4 mg ( Intravenous See Alternative 02/10/21 2252)  thiamine tablet 100 mg (100 mg Oral Given 02/10/21 2248)    Or  thiamine (B-1) injection 100 mg ( Intravenous See Alternative 25/36/64 4034)  folic acid (FOLVITE) tablet 1 mg (1 mg Oral Given 02/10/21 2248)  multivitamin with minerals tablet 1 tablet (1 tablet Oral Given  02/10/21 2248)  LORazepam (ATIVAN) injection 0-4 mg (has no administration in time range)    Followed by  LORazepam (ATIVAN) injection 0-4 mg (has no administration in time range)  morphine 4 MG/ML injection 4 mg (has no administration  in time range)    ED Course  I have reviewed the triage vital signs and the nursing notes.  Pertinent labs & imaging results that were available during my care of the patient were reviewed by me and considered in my medical decision making (see chart for details).    MDM Rules/Calculators/A&P                          This is a 65 year old female with history of significant alcohol use, last drink 1 week ago who presented to the ED after being found down.  Differential includes alcohol withdrawal versus intoxication versus seizure versus metabolic derangement versus subdural hematoma.  On arrival, patient was tachycardic and hypertensive.  Patient is visibly tremulous in the room, however no other injuries are noted with the exception of prior left ankle sprain.  At this time, will perform trauma work-up, including imaging of the left ankle.  We will place patient on stepdown CIWA protocol.   Imaging remarkable for left bimalleolar fracture with widening of medial ankle mortise.  Also significant for acute appearing mildly displaced left transverse process fracture at L1, as well as adjacent acute mildly displaced left 12th posteromedial rib fracture.  Patient will also need admission to the medicine service for management of alcohol withdrawal. Consult to hospitalist placed, waiting for call back.   Consulted Dr. Doreatha Martin for left ankle fracture, they will see the patient in the AM. In the meantime we have placed the patient in a left ankle splint.  Also consulted general surgery and trauma for management of her rib fractures. Neurosurg will follow-up with the patient in the outpatient setting for her 4 week follow-up. Pt signed out to the Hospitalist for admission.   Final Clinical Impression(s) / ED Diagnoses Final diagnoses:  Fall    Rx / DC Orders ED Discharge Orders     None        Orvis Brill, MD 02/10/21 2338    Drenda Freeze, MD 02/14/21 1535

## 2021-02-11 ENCOUNTER — Inpatient Hospital Stay (HOSPITAL_COMMUNITY): Payer: Medicare Other | Admitting: Certified Registered Nurse Anesthetist

## 2021-02-11 ENCOUNTER — Inpatient Hospital Stay (HOSPITAL_COMMUNITY): Payer: Medicare Other

## 2021-02-11 ENCOUNTER — Encounter (HOSPITAL_COMMUNITY)
Admission: EM | Disposition: A | Discharge status: Skilled Nursing Facility | Payer: Self-pay | Source: Home / Self Care | Attending: Internal Medicine

## 2021-02-11 ENCOUNTER — Encounter (HOSPITAL_COMMUNITY): Payer: Self-pay | Admitting: Family Medicine

## 2021-02-11 DIAGNOSIS — F10930 Alcohol use, unspecified with withdrawal, uncomplicated: Secondary | ICD-10-CM

## 2021-02-11 DIAGNOSIS — E041 Nontoxic single thyroid nodule: Secondary | ICD-10-CM

## 2021-02-11 DIAGNOSIS — S82892A Other fracture of left lower leg, initial encounter for closed fracture: Secondary | ICD-10-CM

## 2021-02-11 HISTORY — PX: ORIF ANKLE FRACTURE: SHX5408

## 2021-02-11 LAB — CBC
HCT: 36.1 % (ref 36.0–46.0)
Hemoglobin: 11.9 g/dL — ABNORMAL LOW (ref 12.0–15.0)
MCH: 32.2 pg (ref 26.0–34.0)
MCHC: 33 g/dL (ref 30.0–36.0)
MCV: 97.8 fL (ref 80.0–100.0)
Platelets: 90 10*3/uL — ABNORMAL LOW (ref 150–400)
RBC: 3.69 MIL/uL — ABNORMAL LOW (ref 3.87–5.11)
RDW: 12.9 % (ref 11.5–15.5)
WBC: 13.4 10*3/uL — ABNORMAL HIGH (ref 4.0–10.5)
nRBC: 0 % (ref 0.0–0.2)

## 2021-02-11 LAB — BASIC METABOLIC PANEL
Anion gap: 15 (ref 5–15)
BUN: 11 mg/dL (ref 8–23)
CO2: 17 mmol/L — ABNORMAL LOW (ref 22–32)
Calcium: 8.4 mg/dL — ABNORMAL LOW (ref 8.9–10.3)
Chloride: 107 mmol/L (ref 98–111)
Creatinine, Ser: 0.95 mg/dL (ref 0.44–1.00)
GFR, Estimated: >60 mL/min (ref >60)
Glucose, Bld: 95 mg/dL (ref 70–99)
Potassium: 4.5 mmol/L (ref 3.5–5.1)
Sodium: 139 mmol/L (ref 135–145)

## 2021-02-11 LAB — TROPONIN I (HIGH SENSITIVITY): Troponin I (High Sensitivity): 11 ng/L (ref <18)

## 2021-02-11 LAB — RESP PANEL BY RT-PCR (FLU A&B, COVID) ARPGX2
Influenza A by PCR: NEGATIVE
Influenza B by PCR: NEGATIVE
SARS Coronavirus 2 by RT PCR: NEGATIVE

## 2021-02-11 LAB — SURGICAL PCR SCREEN
MRSA, PCR: NEGATIVE
Staphylococcus aureus: NEGATIVE

## 2021-02-11 LAB — TSH: TSH: 3.348 u[IU]/mL (ref 0.350–4.500)

## 2021-02-11 SURGERY — OPEN REDUCTION INTERNAL FIXATION (ORIF) ANKLE FRACTURE
Anesthesia: General | Site: Ankle | Laterality: Left

## 2021-02-11 MED ORDER — BUPIVACAINE-EPINEPHRINE (PF) 0.5% -1:200000 IJ SOLN
INTRAMUSCULAR | Status: DC | PRN
  Administered 2021-02-11: 20 mL

## 2021-02-11 MED ORDER — 0.9 % SODIUM CHLORIDE (POUR BTL) OPTIME
TOPICAL | Status: DC | PRN
  Administered 2021-02-11: 500 mL

## 2021-02-11 MED ORDER — CHLORDIAZEPOXIDE HCL 5 MG PO CAPS
10.0000 mg | ORAL_CAPSULE | Freq: Three times a day (TID) | ORAL | Status: DC
  Administered 2021-02-11 – 2021-02-14 (×8): 10 mg via ORAL
  Filled 2021-02-11 (×9): qty 2

## 2021-02-11 MED ORDER — DIPHENHYDRAMINE HCL 25 MG PO CAPS: 25.0000 mg | ORAL_CAPSULE | Freq: Four times a day (QID) | ORAL | Status: DC | PRN

## 2021-02-11 MED ORDER — FOLIC ACID 1 MG PO TABS
1.0000 mg | ORAL_TABLET | Freq: Every day | ORAL | Status: DC
  Administered 2021-02-11 – 2021-02-16 (×6): 1 mg via ORAL
  Filled 2021-02-11 (×6): qty 1

## 2021-02-11 MED ORDER — CEFAZOLIN SODIUM-DEXTROSE 2-4 GM/100ML-% IV SOLN
INTRAVENOUS | Status: AC
  Filled 2021-02-11: qty 100

## 2021-02-11 MED ORDER — ADULT MULTIVITAMIN W/MINERALS CH
1.0000 | ORAL_TABLET | Freq: Every day | ORAL | Status: DC
  Administered 2021-02-11 – 2021-02-16 (×6): 1 via ORAL
  Filled 2021-02-11 (×6): qty 1

## 2021-02-11 MED ORDER — DEXAMETHASONE SODIUM PHOSPHATE 10 MG/ML IJ SOLN
INTRAMUSCULAR | Status: DC | PRN
  Administered 2021-02-11: 10 mg via INTRAVENOUS

## 2021-02-11 MED ORDER — CEFAZOLIN SODIUM-DEXTROSE 2-4 GM/100ML-% IV SOLN
2.0000 g | Freq: Three times a day (TID) | INTRAVENOUS | Status: AC
  Administered 2021-02-11 – 2021-02-12 (×3): 2 g via INTRAVENOUS
  Filled 2021-02-11 (×3): qty 100

## 2021-02-11 MED ORDER — PROPOFOL 10 MG/ML IV BOLUS
INTRAVENOUS | Status: DC | PRN
  Administered 2021-02-11: 130 mg via INTRAVENOUS

## 2021-02-11 MED ORDER — DULOXETINE HCL 60 MG PO CPEP
60.0000 mg | ORAL_CAPSULE | Freq: Every day | ORAL | Status: DC
  Administered 2021-02-11 – 2021-02-16 (×6): 60 mg via ORAL
  Filled 2021-02-11 (×6): qty 1

## 2021-02-11 MED ORDER — CHLORHEXIDINE GLUCONATE 0.12 % MT SOLN
OROMUCOSAL | Status: AC
  Filled 2021-02-11: qty 15

## 2021-02-11 MED ORDER — ACETAMINOPHEN 650 MG RE SUPP: 650.0000 mg | Freq: Four times a day (QID) | RECTAL | Status: DC | PRN

## 2021-02-11 MED ORDER — ORAL CARE MOUTH RINSE: 15.0000 mL | Freq: Once | OROMUCOSAL | Status: DC

## 2021-02-11 MED ORDER — FENTANYL CITRATE (PF) 100 MCG/2ML IJ SOLN: 25.0000 ug | Freq: Once | INTRAMUSCULAR | Status: AC

## 2021-02-11 MED ORDER — ALBUTEROL SULFATE (2.5 MG/3ML) 0.083% IN NEBU: 2.5000 mg | INHALATION_SOLUTION | RESPIRATORY_TRACT | Status: DC | PRN

## 2021-02-11 MED ORDER — FENTANYL CITRATE (PF) 100 MCG/2ML IJ SOLN
INTRAMUSCULAR | Status: AC
  Administered 2021-02-11: 25 ug via INTRAVENOUS
  Filled 2021-02-11: qty 2

## 2021-02-11 MED ORDER — METHOCARBAMOL 1000 MG/10ML IJ SOLN
500.0000 mg | Freq: Three times a day (TID) | INTRAVENOUS | Status: DC | PRN
  Filled 2021-02-11 (×2): qty 5

## 2021-02-11 MED ORDER — ROPIVACAINE HCL 5 MG/ML IJ SOLN
INTRAMUSCULAR | Status: DC | PRN
  Administered 2021-02-11: 15 mL via PERINEURAL

## 2021-02-11 MED ORDER — PHENYLEPHRINE HCL-NACL 20-0.9 MG/250ML-% IV SOLN: INTRAVENOUS | Status: DC | PRN

## 2021-02-11 MED ORDER — CHLORHEXIDINE GLUCONATE 0.12 % MT SOLN: 15.0000 mL | Freq: Once | OROMUCOSAL | Status: DC

## 2021-02-11 MED ORDER — MORPHINE SULFATE (PF) 2 MG/ML IV SOLN
1.0000 mg | INTRAVENOUS | Status: DC | PRN
Start: 2021-02-11 — End: 2021-02-16
  Administered 2021-02-12: 2 mg via INTRAVENOUS
  Filled 2021-02-11: qty 1

## 2021-02-11 MED ORDER — ACETAMINOPHEN 500 MG PO TABS
1000.0000 mg | ORAL_TABLET | Freq: Four times a day (QID) | ORAL | Status: DC
  Administered 2021-02-11 – 2021-02-16 (×17): 1000 mg via ORAL
  Filled 2021-02-11 (×18): qty 2

## 2021-02-11 MED ORDER — ENOXAPARIN SODIUM 40 MG/0.4ML IJ SOSY
40.0000 mg | PREFILLED_SYRINGE | Freq: Every day | INTRAMUSCULAR | Status: DC
  Administered 2021-02-12 – 2021-02-16 (×5): 40 mg via SUBCUTANEOUS
  Filled 2021-02-11 (×5): qty 0.4

## 2021-02-11 MED ORDER — PHENYLEPHRINE 40 MCG/ML (10ML) SYRINGE FOR IV PUSH (FOR BLOOD PRESSURE SUPPORT)
PREFILLED_SYRINGE | INTRAVENOUS | Status: DC | PRN
  Administered 2021-02-11: 120 ug via INTRAVENOUS
  Administered 2021-02-11: 80 ug via INTRAVENOUS
  Administered 2021-02-11: 120 ug via INTRAVENOUS

## 2021-02-11 MED ORDER — CEFAZOLIN SODIUM-DEXTROSE 2-3 GM-%(50ML) IV SOLR
INTRAVENOUS | Status: DC | PRN
  Administered 2021-02-11: 2 g via INTRAVENOUS

## 2021-02-11 MED ORDER — FENTANYL CITRATE (PF) 250 MCG/5ML IJ SOLN
INTRAMUSCULAR | Status: AC
  Filled 2021-02-11: qty 5

## 2021-02-11 MED ORDER — OXYCODONE HCL 5 MG PO TABS
5.0000 mg | ORAL_TABLET | ORAL | Status: DC | PRN
Start: 2021-02-11 — End: 2021-02-16
  Administered 2021-02-14 – 2021-02-16 (×4): 10 mg via ORAL
  Filled 2021-02-11 (×4): qty 2

## 2021-02-11 MED ORDER — ONDANSETRON HCL 4 MG/2ML IJ SOLN
INTRAMUSCULAR | Status: DC | PRN
  Administered 2021-02-11: 4 mg via INTRAVENOUS

## 2021-02-11 MED ORDER — ACETAMINOPHEN 325 MG PO TABS: 650.0000 mg | ORAL_TABLET | Freq: Four times a day (QID) | ORAL | Status: DC | PRN

## 2021-02-11 MED ORDER — LACTATED RINGERS IV SOLN: INTRAVENOUS | Status: DC

## 2021-02-11 MED ORDER — CARVEDILOL 3.125 MG PO TABS
3.1250 mg | ORAL_TABLET | Freq: Two times a day (BID) | ORAL | Status: DC
  Administered 2021-02-11 – 2021-02-16 (×10): 3.125 mg via ORAL
  Filled 2021-02-11 (×10): qty 1

## 2021-02-11 MED ORDER — MIDAZOLAM HCL 2 MG/2ML IJ SOLN
INTRAMUSCULAR | Status: AC
  Filled 2021-02-11: qty 2

## 2021-02-11 MED ORDER — ALBUTEROL SULFATE HFA 108 (90 BASE) MCG/ACT IN AERS: 2.0000 | INHALATION_SPRAY | RESPIRATORY_TRACT | Status: DC | PRN

## 2021-02-11 MED ORDER — LORAZEPAM 1 MG PO TABS: 1.0000 mg | ORAL_TABLET | ORAL | Status: AC | PRN

## 2021-02-11 MED ORDER — THIAMINE HCL 100 MG/ML IJ SOLN: 100.0000 mg | Freq: Every day | INTRAMUSCULAR | Status: DC

## 2021-02-11 MED ORDER — LIDOCAINE 2% (20 MG/ML) 5 ML SYRINGE
INTRAMUSCULAR | Status: DC | PRN
  Administered 2021-02-11: 60 mg via INTRAVENOUS

## 2021-02-11 MED ORDER — FENTANYL CITRATE (PF) 250 MCG/5ML IJ SOLN
INTRAMUSCULAR | Status: DC | PRN
  Administered 2021-02-11: 50 ug via INTRAVENOUS

## 2021-02-11 MED ORDER — THIAMINE HCL 100 MG PO TABS
100.0000 mg | ORAL_TABLET | Freq: Every day | ORAL | Status: DC
  Administered 2021-02-11 – 2021-02-16 (×6): 100 mg via ORAL
  Filled 2021-02-11 (×6): qty 1

## 2021-02-11 MED ORDER — ACETAMINOPHEN 500 MG PO TABS: 1000.0000 mg | ORAL_TABLET | Freq: Four times a day (QID) | ORAL | Status: DC | PRN

## 2021-02-11 MED ORDER — DIPHENHYDRAMINE HCL 50 MG/ML IJ SOLN: 12.5000 mg | Freq: Four times a day (QID) | INTRAMUSCULAR | Status: DC | PRN

## 2021-02-11 MED ORDER — LORAZEPAM 2 MG/ML IJ SOLN: 1.0000 mg | INTRAMUSCULAR | Status: AC | PRN

## 2021-02-11 SURGICAL SUPPLY — 79 items
BAG COUNTER SPONGE SURGICOUNT (BAG) ×3 IMPLANT
BAG SPNG CNTER NS LX DISP (BAG) ×1
BAG SURGICOUNT SPONGE COUNTING (BAG) ×1
BANDAGE ESMARK 6X9 LF (GAUZE/BANDAGES/DRESSINGS) ×2 IMPLANT
BIT DRILL 2.3 120MM (DRILL) IMPLANT
BIT DRILL SURGICAL AKA 3.2X150 (DRILL) IMPLANT
BIT DRILL SURGICL CANN 3.2X150 (DRILL) IMPLANT
BNDG CMPR 9X6 STRL LF SNTH (GAUZE/BANDAGES/DRESSINGS) ×1
BNDG ELASTIC 4X5.8 VLCR STR LF (GAUZE/BANDAGES/DRESSINGS) ×2 IMPLANT
BNDG ELASTIC 6X5.8 VLCR STR LF (GAUZE/BANDAGES/DRESSINGS) ×2 IMPLANT
BNDG ESMARK 6X9 LF (GAUZE/BANDAGES/DRESSINGS) ×3
BNDG GAUZE ELAST 4 BULKY (GAUZE/BANDAGES/DRESSINGS) ×6 IMPLANT
BRUSH SCRUB EZ PLAIN DRY (MISCELLANEOUS) ×8 IMPLANT
COVER MAYO STAND STRL (DRAPES) ×4 IMPLANT
COVER SURGICAL LIGHT HANDLE (MISCELLANEOUS) ×4 IMPLANT
CUFF TOURN SGL QUICK 34 (TOURNIQUET CUFF) ×3
CUFF TRNQT CYL 34X4.125X (TOURNIQUET CUFF) ×2 IMPLANT
DRAPE C-ARM 42X72 X-RAY (DRAPES) ×4 IMPLANT
DRAPE C-ARMOR (DRAPES) ×4 IMPLANT
DRAPE HALF SHEET 40X57 (DRAPES) ×4 IMPLANT
DRAPE U-SHAPE 47X51 STRL (DRAPES) ×4 IMPLANT
DRILL 2.3 120MM AKA DRILL 2.3L (DRILL) ×3
DRILL SURGICAL AKA 3.2X150 (DRILL) ×3
DRILL SURGICAL CANN 3.2X150 (DRILL) ×3
DRSG EMULSION OIL 3X3 NADH (GAUZE/BANDAGES/DRESSINGS) IMPLANT
ELECT REM PT RETURN 9FT ADLT (ELECTROSURGICAL) ×3
ELECTRODE REM PT RTRN 9FT ADLT (ELECTROSURGICAL) ×2 IMPLANT
GAUZE SPONGE 4X4 12PLY STRL (GAUZE/BANDAGES/DRESSINGS) ×6 IMPLANT
GLOVE SRG 8 PF TXTR STRL LF DI (GLOVE) ×2 IMPLANT
GLOVE SURG ENC MOIS LTX SZ8 (GLOVE) ×4 IMPLANT
GLOVE SURG ORTHO LTX SZ7.5 (GLOVE) ×8 IMPLANT
GLOVE SURG UNDER POLY LF SZ7.5 (GLOVE) ×4 IMPLANT
GLOVE SURG UNDER POLY LF SZ8 (GLOVE) ×3
GOWN STRL REUS W/ TWL LRG LVL3 (GOWN DISPOSABLE) ×4 IMPLANT
GOWN STRL REUS W/ TWL XL LVL3 (GOWN DISPOSABLE) ×2 IMPLANT
GOWN STRL REUS W/TWL LRG LVL3 (GOWN DISPOSABLE) ×6
GOWN STRL REUS W/TWL XL LVL3 (GOWN DISPOSABLE) ×3
K-WIRE FIXATION .9X100 (WIRE) ×9
KIT BASIN OR (CUSTOM PROCEDURE TRAY) ×4 IMPLANT
KIT TURNOVER KIT B (KITS) ×4 IMPLANT
KWIRE FIXATION .9X100 (WIRE) IMPLANT
MANIFOLD NEPTUNE II (INSTRUMENTS) ×4 IMPLANT
NDL HYPO 21X1.5 SAFETY (NEEDLE) IMPLANT
NEEDLE HYPO 21X1.5 SAFETY (NEEDLE) IMPLANT
NS IRRIG 1000ML POUR BTL (IV SOLUTION) ×4 IMPLANT
PACK GENERAL/GYN (CUSTOM PROCEDURE TRAY) ×4 IMPLANT
PACK ORTHO EXTREMITY (CUSTOM PROCEDURE TRAY) ×4 IMPLANT
PAD ARMBOARD 7.5X6 YLW CONV (MISCELLANEOUS) ×8 IMPLANT
PAD CAST 4YDX4 CTTN HI CHSV (CAST SUPPLIES) IMPLANT
PADDING CAST COTTON 4X4 STRL (CAST SUPPLIES) ×9
PADDING CAST COTTON 6X4 STRL (CAST SUPPLIES) ×2 IMPLANT
PLATE CLUSTER 12H 6H (Plate) ×2 IMPLANT
SCREW CANC 3.8X30 (Screw) ×2 IMPLANT
SCREW CANC NSTRL 3.8X38 (Screw) ×2 IMPLANT
SCREW CORT THRD HEAD 2.7X12 (Screw) ×2 IMPLANT
SCREW CORT THRD HEAD 2.7X14 (Screw) ×2 IMPLANT
SCREW CORT THRD HEAD 3.2X10 (Screw) ×4 IMPLANT
SCREW HEX 3.2X12 (Screw) ×2 IMPLANT
SCREW LOCK THRD CLAV 2.7X10 (Screw) ×2 IMPLANT
SCREW LOCK THRD CLAV 2.7X14 (Screw) ×4 IMPLANT
SCREW LOCK THRD CLAV 2.7X16 (Screw) ×2 IMPLANT
SCREW LOCKING 3.2X10MM (Screw) ×4 IMPLANT
SCREW NLOCK HEX 4X45 (Screw) ×2 IMPLANT
SLED MALLEOLAR 36MM (Orthopedic Implant) ×2 IMPLANT
SPONGE T-LAP 18X18 ~~LOC~~+RFID (SPONGE) ×4 IMPLANT
STAPLER VISISTAT 35W (STAPLE) IMPLANT
SUCTION FRAZIER HANDLE 10FR (MISCELLANEOUS) ×3
SUCTION TUBE FRAZIER 10FR DISP (MISCELLANEOUS) ×2 IMPLANT
SUT ETHILON 2 0 FS 18 (SUTURE) ×8 IMPLANT
SUT PDS AB 2-0 CT1 27 (SUTURE) IMPLANT
SUT VIC AB 2-0 CT1 27 (SUTURE) ×6
SUT VIC AB 2-0 CT1 TAPERPNT 27 (SUTURE) ×4 IMPLANT
TOWEL GREEN STERILE (TOWEL DISPOSABLE) ×8 IMPLANT
TOWEL GREEN STERILE FF (TOWEL DISPOSABLE) ×4 IMPLANT
TUBE CONNECTING 12'X1/4 (SUCTIONS) ×1
TUBE CONNECTING 12X1/4 (SUCTIONS) ×3 IMPLANT
UNDERPAD 30X36 HEAVY ABSORB (UNDERPADS AND DIAPERS) ×4 IMPLANT
WASHER MALLEOLAR MEDIAL (Washer) ×2 IMPLANT
WATER STERILE IRR 1000ML POUR (IV SOLUTION) ×4 IMPLANT

## 2021-02-11 NOTE — Anesthesia Preprocedure Evaluation (Addendum)
Anesthesia Evaluation  Patient identified by MRN, date of birth, ID band Patient awake    Reviewed: Allergy & Precautions, NPO status , Patient's Chart, lab work & pertinent test results  History of Anesthesia Complications (+) PONV and history of anesthetic complications  Airway Mallampati: III  TM Distance: >3 FB Neck ROM: Full    Dental  (+) Teeth Intact, Dental Advisory Given   Pulmonary former smoker,    breath sounds clear to auscultation       Cardiovascular hypertension, + Valvular Problems/Murmurs MVP  Rhythm:Regular Rate:Tachycardia     Neuro/Psych  Headaches, PSYCHIATRIC DISORDERS Anxiety Depression    GI/Hepatic Neg liver ROS, GERD  ,  Endo/Other  negative endocrine ROS  Renal/GU negative Renal ROS     Musculoskeletal  (+) Arthritis ,   Abdominal Normal abdominal exam  (+)   Peds  Hematology negative hematology ROS (+)   Anesthesia Other Findings   Reproductive/Obstetrics                           Anesthesia Physical Anesthesia Plan  ASA: 2  Anesthesia Plan: General   Post-op Pain Management: Regional block   Induction:   PONV Risk Score and Plan: 4 or greater and Ondansetron, Dexamethasone, Midazolam and Scopolamine patch - Pre-op  Airway Management Planned: LMA  Additional Equipment: None  Intra-op Plan:   Post-operative Plan: Extubation in OR  Informed Consent: I have reviewed the patients History and Physical, chart, labs and discussed the procedure including the risks, benefits and alternatives for the proposed anesthesia with the patient or authorized representative who has indicated his/her understanding and acceptance.     Dental advisory given  Plan Discussed with: CRNA  Anesthesia Plan Comments:        Anesthesia Quick Evaluation

## 2021-02-11 NOTE — Consult Note (Addendum)
Terri Ayala Mar 09, 1955  [id number].    Requesting MD: Dr. Darl Householder Chief Complaint/Reason for Consult: fall, trauma  HPI:  Terri Ayala is a 65 yo female who presented to the ED after a fall at home.  She reports that she sprained her ankle a few weeks ago after a fall and has been using a walker.  Earlier today she tripped while using the walker, and was found lying on the floor.  She normally drinks 2-3 bottles of wine per day but stopped drinking several days ago.  Imaging work-up in the ED showed a left ankle fracture, a lumbar TP fracture, and a left 12th rib fracture.  She was noted to have withdrawal symptoms in the ED with tremors, hypertension, and tachycardia, and has been admitted to the hospitalist service for withdrawal treatment.  Trauma surgery was consulted.  She denies any chest pain or abdominal pain.  ROS: Review of Systems  Constitutional:  Negative for chills and fever.  HENT:  Negative for sore throat.   Respiratory:  Negative for stridor.   Cardiovascular:  Negative for chest pain.  Gastrointestinal:  Negative for abdominal pain, nausea and vomiting.  Musculoskeletal:  Positive for falls. Negative for back pain.  Neurological:  Negative for loss of consciousness.   Family History  Problem Relation Age of Onset   Colon cancer Father        Deceased, 22   Osteoporosis Mother        Living, 46   Healthy Brother    Healthy Brother    Healthy Son    Healthy Son    Thyroid disease Neg Hx    Goiter Neg Hx     Past Medical History:  Diagnosis Date   Alcohol withdrawal ([place]) 11/2017; 01/04/2018   Alcoholism ([place])    Anxiety    Arthritis    "hands" (01/04/2018)   ASYMPTOMATIC POSTMENOPAUSAL STATUS 08/17/2009   Chronic lower back pain    Depression    DYSPHAGIA 06/19/2007   Fatty liver, alcoholic    GERD (gastroesophageal reflux disease)    GOITER, MULTINODULAR 08/17/2009   Hyperlipidemia    Migraines    "not sure what triggers them; I'll have 1 q couple weeks  or more; come 2 days in a row when they come" (01/04/2018)   Mitral valve prolapse    OSTEOPOROSIS 08/17/2009   PONV (postoperative nausea and vomiting)    Supraventricular tachycardia ([place])     Past Surgical History:  Procedure Laterality Date   BREAST BIOPSY Right    "benign"   CATARACT EXTRACTION W/ INTRAOCULAR LENS  IMPLANT, BILATERAL     CERVICAL CONE BIOPSY  2000s   CERVIX LESION DESTRUCTION  1991   "dysplasia; lesions"   CHOLECYSTECTOMY N/A 03/14/2013   Procedure: LAPAROSCOPIC CHOLECYSTECTOMY WITH ATTEMPTED INTRAOPERATIVE CHOLANGIOGRAM;  Surgeon: Harl Bowie, MD;  Location: [facility];  Service: General;  Laterality: N/A;   laporoscopic abdominal surgery     "for endometriosis"   LEFT HEART CATH AND CORONARY ANGIOGRAPHY N/A 05/07/2018   Procedure: LEFT HEART CATH AND CORONARY ANGIOGRAPHY;  Surgeon: Belva Crome, MD;  Location: [facility];  Service: Cardiovascular;  Laterality: N/A;    Social History:  reports that she quit smoking about 46 years ago. Her smoking use included cigarettes. She has a 7.00 pack-year smoking history. She has never used smokeless tobacco. She reports current alcohol use of about 28.0 standard drinks per week. She reports that she does not use drugs.  Allergies:  Allergies  Allergen Reactions   Doxycycline Swelling    Mouth swelling and sores   Codeine Itching   Tetracycline Hcl Swelling    Mouth swelling and sores    (Not in a hospital admission)    Physical Exam: Blood pressure (!) 117/98, pulse (!) 105, temperature 98.1 F (36.7 C), temperature source Oral, resp. rate 17, SpO2 99 %. General: resting comfortably, no apparent distress Neurological: alert and oriented, no focal deficits, cranial nerves grossly in tact HEENT: normocephalic, atraumatic, oropharynx clear, no scleral icterus CV: regular rate and rhythm, extremities warm and well-perfused Respiratory: normal work of breathing on room air, symmetric chest wall expansion,  no chest wall ecchymoses or deformities. Abdomen: soft, nondistended, nontender to deep palpation. No masses or organomegaly.  No abdominal wall ecchymoses. Extremities: warm and well-perfused, splint in place on left lower extremity. Psychiatric: normal mood and affect Skin: warm and dry, no jaundice, no rashes or lesions   Results for orders placed or performed during the hospital encounter of 02/10/21 (from the past 48 hour(s))  CBC with Differential/Platelet     Status: Abnormal   Collection Time: 02/10/21  9:09 PM  Result Value Ref Range   WBC 13.5 (H) 4.0 - 10.5 K/uL   RBC 3.79 (L) 3.87 - 5.11 MIL/uL   Hemoglobin 12.1 12.0 - 15.0 g/dL   HCT 36.6 36.0 - 46.0 %   MCV 96.6 80.0 - 100.0 fL   MCH 31.9 26.0 - 34.0 pg   MCHC 33.1 30.0 - 36.0 g/dL   RDW 12.9 11.5 - 15.5 %   Platelets 123 (L) 150 - 400 K/uL    Comment: Immature Platelet Fraction may be clinically indicated, consider ordering this additional test [id number]    nRBC 0.0 0.0 - 0.2 %   Neutrophils Relative % 90 %   Neutro Abs 12.0 (H) 1.7 - 7.7 K/uL   Lymphocytes Relative 4 %   Lymphs Abs 0.6 (L) 0.7 - 4.0 K/uL   Monocytes Relative 6 %   Monocytes Absolute 0.8 0.1 - 1.0 K/uL   Eosinophils Relative 0 %   Eosinophils Absolute 0.0 0.0 - 0.5 K/uL   Basophils Relative 0 %   Basophils Absolute 0.0 0.0 - 0.1 K/uL   Immature Granulocytes 0 %   Abs Immature Granulocytes 0.04 0.00 - 0.07 K/uL    Comment: Performed at [facility], [address] [place], [place] [address]  Comprehensive metabolic panel     Status: Abnormal   Collection Time: 02/10/21  9:09 PM  Result Value Ref Range   Sodium 136 135 - 145 mmol/L   Potassium 3.6 3.5 - 5.1 mmol/L   Chloride 101 98 - 111 mmol/L   CO2 15 (L) 22 - 32 mmol/L   Glucose, Bld 125 (H) 70 - 99 mg/dL    Comment: Glucose reference range applies only to samples taken after fasting for at least 8 hours.   BUN 14 8 - 23 mg/dL   Creatinine, Ser 1.05 (H) 0.44 - 1.00 mg/dL    Calcium 8.9 8.9 - 10.3 mg/dL   Total Protein 7.9 6.5 - 8.1 g/dL   Albumin 4.3 3.5 - 5.0 g/dL   AST 50 (H) 15 - 41 U/L   ALT 19 0 - 44 U/L   Alkaline Phosphatase 64 38 - 126 U/L   Total Bilirubin 1.9 (H) 0.3 - 1.2 mg/dL   GFR, Estimated 59 (L) >60 mL/min    Comment: (NOTE) Calculated using the CKD-EPI Creatinine  Equation (2021)    Anion gap 20 (H) 5 - 15    Comment: Performed at [facility], [place] [address], [place], [place] [address]  Troponin I (High Sensitivity)     Status: None   Collection Time: 02/10/21  9:09 PM  Result Value Ref Range   Troponin I (High Sensitivity) 10 <18 ng/L    Comment: (NOTE) Elevated high sensitivity troponin I (hsTnI) values and significant  changes across serial measurements may suggest ACS but many other  chronic and acute conditions are known to elevate hsTnI results.  Refer to the "Links" section for chest pain algorithms and additional  guidance. Performed at [facility], [place] [address], [place], [place] [address]   CK     Status: Abnormal   Collection Time: 02/10/21  9:09 PM  Result Value Ref Range   Total CK 662 (H) 38 - 234 U/L    Comment: Performed at [facility], [place] [address] [place], [place] [address]  Ethanol     Status: None   Collection Time: 02/10/21  9:15 PM  Result Value Ref Range   Alcohol, Ethyl (B) <10 <10 mg/dL    Comment: (NOTE) Lowest detectable limit for serum alcohol is 10 mg/dL.  For medical purposes only. Performed at [facility], [place] [address] [place], [place] [address]   I-stat chem 8, ED (not at [facility] or [facility])     Status: Abnormal   Collection Time: 02/10/21 10:18 PM  Result Value Ref Range   Sodium 138 135 - 145 mmol/L   Potassium 3.8 3.5 - 5.1 mmol/L   Chloride 105 98 - 111 mmol/L   BUN 16 8 - 23 mg/dL   Creatinine, Ser 0.60 0.44 - 1.00 mg/dL   Glucose, Bld 123 (H) 70 - 99 mg/dL    Comment: Glucose reference range applies only to samples taken after fasting for at least 8  hours.   Calcium, Ion 1.15 1.15 - 1.40 mmol/L   TCO2 16 (L) 22 - 32 mmol/L   Hemoglobin 13.9 12.0 - 15.0 g/dL   HCT 41.0 36.0 - 46.0 %  Resp Panel by RT-PCR (Flu A&B, Covid) Nasopharyngeal Swab     Status: None   Collection Time: 02/10/21 11:30 PM   Specimen: Nasopharyngeal Swab; Nasopharyngeal(NP) swabs in vial transport medium  Result Value Ref Range   SARS Coronavirus 2 by RT PCR NEGATIVE NEGATIVE    Comment: (NOTE) SARS-CoV-2 target nucleic acids are NOT DETECTED.  The SARS-CoV-2 RNA is generally detectable in upper respiratory specimens during the acute phase of infection. The lowest concentration of SARS-CoV-2 viral copies this assay can detect is 138 copies/mL. A negative result does not preclude SARS-Cov-2 infection and should not be used as the sole basis for treatment or other patient management decisions. A negative result may occur with  improper specimen collection/handling, submission of specimen other than nasopharyngeal swab, presence of viral mutation(s) within the areas targeted by this assay, and inadequate number of viral copies(<138 copies/mL). A negative result must be combined with clinical observations, patient history, and epidemiological information. The expected result is Negative.  Fact Sheet for Patients:  EntrepreneurPulse.com.au  Fact Sheet for Healthcare Providers:  IncredibleEmployment.be  This test is no t yet approved or cleared by the [place] FDA and  has been authorized for detection and/or diagnosis of SARS-CoV-2 by FDA under an Emergency Use Authorization (EUA). This EUA will remain  in effect (meaning this test can be  used) for the duration of the COVID-19 declaration under Section 564(b)(1) of the Act, 21 U.S.C.section 360bbb-3(b)(1), unless the authorization is terminated  or revoked sooner.       Influenza A by PCR NEGATIVE NEGATIVE   Influenza B by PCR NEGATIVE NEGATIVE    Comment:  (NOTE) The Xpert Xpress SARS-CoV-2/FLU/RSV plus assay is intended as an aid in the diagnosis of influenza from Nasopharyngeal swab specimens and should not be used as a sole basis for treatment. Nasal washings and aspirates are unacceptable for Xpert Xpress SARS-CoV-2/FLU/RSV testing.  Fact Sheet for Patients: EntrepreneurPulse.com.au  Fact Sheet for Healthcare Providers: IncredibleEmployment.be  This test is not yet approved or cleared by the [place] FDA and has been authorized for detection and/or diagnosis of SARS-CoV-2 by FDA under an Emergency Use Authorization (EUA). This EUA will remain in effect (meaning this test can be used) for the duration of the COVID-19 declaration under Section 564(b)(1) of the Act, 21 U.S.C. section 360bbb-3(b)(1), unless the authorization is terminated or revoked.  Performed at [facility], [place] [address], [place], [place] [address]    CT HEAD WO CONTRAST (5MM)  Result Date: 02/10/2021 CLINICAL DATA:  Head trauma found lying on floor EXAM: CT HEAD WITHOUT CONTRAST CT CERVICAL SPINE WITHOUT CONTRAST TECHNIQUE: Multidetector CT imaging of the head and cervical spine was performed following the standard protocol without intravenous contrast. Multiplanar CT image reconstructions of the cervical spine were also generated. COMPARISON:  CT brain and cervical spine 01/30/2018 FINDINGS: CT HEAD FINDINGS Brain: No acute territorial infarction, hemorrhage or intracranial mass. Stable left cerebellar calcification. Moderate atrophy. Mild chronic small vessel ischemic changes of the white matter. Stable ventricle size Vascular: No hyperdense vessels.  No unexpected calcification Skull: Normal. Negative for fracture or focal lesion. Sinuses/Orbits: No acute finding. Other: None CT CERVICAL SPINE FINDINGS Alignment: No subluxation.  Facet alignment within normal limits Skull base and vertebrae: No acute fracture. No  primary bone lesion or focal pathologic process. Soft tissues and spinal canal: No prevertebral fluid or swelling. No visible canal hematoma. Disc levels: Multilevel degenerative change. Moderate disc space narrowing C4-C5. Advanced degenerative changes C6-C7. Hypertrophic facet degenerative changes at multiple levels. Upper chest: Lung apices are clear. 1.9 cm hypodense nodule right lobe of thyroid and 17 mm hypodense nodule left lobe of thyroid. Other: None IMPRESSION: 1. No CT evidence for acute intracranial abnormality. Stable atrophy. 2. Degenerative changes of the cervical spine. No acute osseous abnormality 3. 1.9 cm hypodense nodule right lobe of thyroid. Recommend thyroid US (ref: J Am Coll Radiol. 2015 Feb;12(2): 143-50).This may be performed non emergently Electronically Signed   By: Donavan Foil M.D.   On: 02/10/2021 22:39   CT Cervical Spine Wo Contrast  Result Date: 02/10/2021 CLINICAL DATA:  Head trauma found lying on floor EXAM: CT HEAD WITHOUT CONTRAST CT CERVICAL SPINE WITHOUT CONTRAST TECHNIQUE: Multidetector CT imaging of the head and cervical spine was performed following the standard protocol without intravenous contrast. Multiplanar CT image reconstructions of the cervical spine were also generated. COMPARISON:  CT brain and cervical spine 01/30/2018 FINDINGS: CT HEAD FINDINGS Brain: No acute territorial infarction, hemorrhage or intracranial mass. Stable left cerebellar calcification. Moderate atrophy. Mild chronic small vessel ischemic changes of the white matter. Stable ventricle size Vascular: No hyperdense vessels.  No unexpected calcification Skull: Normal. Negative for fracture or focal lesion. Sinuses/Orbits: No acute finding. Other: None CT CERVICAL SPINE FINDINGS Alignment: No subluxation.  Facet alignment within normal limits Skull base and  vertebrae: No acute fracture. No primary bone lesion or focal pathologic process. Soft tissues and spinal canal: No prevertebral fluid or  swelling. No visible canal hematoma. Disc levels: Multilevel degenerative change. Moderate disc space narrowing C4-C5. Advanced degenerative changes C6-C7. Hypertrophic facet degenerative changes at multiple levels. Upper chest: Lung apices are clear. 1.9 cm hypodense nodule right lobe of thyroid and 17 mm hypodense nodule left lobe of thyroid. Other: None IMPRESSION: 1. No CT evidence for acute intracranial abnormality. Stable atrophy. 2. Degenerative changes of the cervical spine. No acute osseous abnormality 3. 1.9 cm hypodense nodule right lobe of thyroid. Recommend thyroid US (ref: J Am Coll Radiol. 2015 Feb;12(2): 143-50).This may be performed non emergently Electronically Signed   By: Donavan Foil M.D.   On: 02/10/2021 22:39   CT Lumbar Spine Wo Contrast  Result Date: 02/10/2021 CLINICAL DATA:  Found on floor EXAM: CT LUMBAR SPINE WITHOUT CONTRAST TECHNIQUE: Multidetector CT imaging of the lumbar spine was performed without intravenous contrast administration. Multiplanar CT image reconstructions were also generated. COMPARISON:  CT 09/26/2020 FINDINGS: Segmentation: 5 lumbar type vertebrae. Alignment: Normal. Vertebrae: Vertebral body heights appear normal. Acute appearing left transverse process fracture at L1. Paraspinal and other soft tissues: No paravertebral or paraspinal soft tissue abnormality is evident. Aortic atherosclerosis. Acute mildly displaced left twelfth posteromedial rib fracture. Disc levels: At T12-L1, maintained disc space. No canal stenosis. Foramen are patent. At L1-L2, maintained disc space. No canal stenosis. The foramen are patent. At L2-L3, mild disc space narrowing. No canal stenosis. The foramen are patent bilaterally. At L3-L4, maintained disc space. Left foraminal and lateral disc protrusion. No significant foraminal narrowing. No canal stenosis. Mild facet degenerative changes. At L4-L5, maintained disc space. No canal stenosis. Mild facet degenerative changes. The  foramen are patent bilaterally. At L5-S1, mild to moderate disc space narrowing. Posterior disc osteophyte complex. No canal stenosis. Facet degenerative changes. The foramen are patent bilaterally. IMPRESSION: 1. Acute appearing mildly displaced left transverse process fracture at L1. Adjacent acute mildly displaced left twelfth posteromedial rib fracture 2. Minimal degenerative changes. Electronically Signed   By: Donavan Foil M.D.   On: 02/10/2021 22:46   DG Pelvis Portable  Result Date: 02/10/2021 CLINICAL DATA:  Fall EXAM: PORTABLE PELVIS 1-2 VIEWS COMPARISON:  None. FINDINGS: Hip joints and SI joints are symmetric. No acute bony abnormality. Specifically, no fracture, subluxation, or dislocation. IMPRESSION: Negative. Electronically Signed   By: Rolm Baptise M.D.   On: 02/10/2021 21:40   DG Chest Port 1 View  Result Date: 02/10/2021 CLINICAL DATA:  Found on floor. EXAM: PORTABLE CHEST 1 VIEW COMPARISON:  11/20/2020 FINDINGS: Heart and mediastinal contours are within normal limits. No focal opacities or effusions. No acute bony abnormality. IMPRESSION: No active disease. Electronically Signed   By: Rolm Baptise M.D.   On: 02/10/2021 21:39   DG Ankle Left Port  Result Date: 02/10/2021 CLINICAL DATA:  Found on floor EXAM: PORTABLE LEFT ANKLE - 2 VIEW COMPARISON:  None. FINDINGS: Bimalleolar fracture with oblique fracture through the distal fibular metaphysis and transverse fracture through the medial malleolus. Widening of the medial ankle mortise. No dislocation. Diffuse soft tissue swelling. IMPRESSION: Bimalleolar fracture with widening of the medial ankle mortise. Electronically Signed   By: Rolm Baptise M.D.   On: 02/10/2021 21:40      Assessment/Plan 65 year old female with history of heavy EtOH use presenting after recent fall from standing. Bimalleolar left ankle fracture L1 answers process fracture Left 12th rib fracture  -Patient currently  denies chest and back pain and does  not seem to have any symptoms from the rib fracture, in no respiratory distress.  Recommend treatment with multimodal pain control. -I reviewed her lumbar spine CT and there are no obvious intraabdominal injuries, however this was noncontrast and not ideal for assessment of solid organs.  Patient currently does not have any abdominal pain, but if she develops abdominal symptoms would have a low threshold to perform dedicated abdominal imaging with a contrasted CT scan -Management of ankle fracture per orthopedic surgery. -CIWA protocol started due to concern for withdrawal -Remainder of care per primary team, trauma will follow.   Michaelle Birks, MD [facility], Hepatobiliary and Pancreatic Surgery 02/11/21 1:12 AM

## 2021-02-11 NOTE — Consult Note (Signed)
Reason for Consult:Left ankle fx Referring Physician: Phillips Climes Time called: [id number] Time at bedside: Terri Ayala is an 65 y.o. female.  HPI: Terri Ayala was at home putting on some pants when she lost her balance and fell. She had immediate left ankle pain and could not bear weight. She was brought to the ED where x-rays showed a left ankle fx in addition to other injuries and orthopedic surgery was consulted. She lives at home alone and is retired.   Past Medical History:  Diagnosis Date   Alcohol withdrawal ([place]) 11/2017; 01/04/2018   Alcoholism ([place])    Anxiety    Arthritis    "hands" (01/04/2018)   ASYMPTOMATIC POSTMENOPAUSAL STATUS 08/17/2009   Chronic lower back pain    Depression    DYSPHAGIA 06/19/2007   Fatty liver, alcoholic    GERD (gastroesophageal reflux disease)    GOITER, MULTINODULAR 08/17/2009   Hyperlipidemia    Migraines    "not sure what triggers them; I'll have 1 q couple weeks or more; come 2 days in a row when they come" (01/04/2018)   Mitral valve prolapse    OSTEOPOROSIS 08/17/2009   PONV (postoperative nausea and vomiting)    Supraventricular tachycardia ([place])     Past Surgical History:  Procedure Laterality Date   BREAST BIOPSY Right    "benign"   CATARACT EXTRACTION W/ INTRAOCULAR LENS  IMPLANT, BILATERAL     CERVICAL CONE BIOPSY  2000s   CERVIX LESION DESTRUCTION  1991   "dysplasia; lesions"   CHOLECYSTECTOMY N/A 03/14/2013   Procedure: LAPAROSCOPIC CHOLECYSTECTOMY WITH ATTEMPTED INTRAOPERATIVE CHOLANGIOGRAM;  Surgeon: Harl Bowie, MD;  Location: [facility];  Service: General;  Laterality: N/A;   laporoscopic abdominal surgery     "for endometriosis"   LEFT HEART CATH AND CORONARY ANGIOGRAPHY N/A 05/07/2018   Procedure: LEFT HEART CATH AND CORONARY ANGIOGRAPHY;  Surgeon: Belva Crome, MD;  Location: [facility];  Service: Cardiovascular;  Laterality: N/A;    Family History  Problem Relation Age of Onset   Colon cancer Father         Deceased, 74   Osteoporosis Mother        Living, 94   Healthy Brother    Healthy Brother    Healthy Son    Healthy Son    Thyroid disease Neg Hx    Goiter Neg Hx     Social History:  reports that she quit smoking about 46 years ago. Her smoking use included cigarettes. She has a 7.00 pack-year smoking history. She has never used smokeless tobacco. She reports current alcohol use of about 28.0 standard drinks per week. She reports that she does not use drugs.  Allergies:  Allergies  Allergen Reactions   Doxycycline Swelling    Mouth swelling and sores   Codeine Itching   Tetracycline Hcl Swelling    Mouth swelling and sores    Medications: I have reviewed the patient's current medications.  Results for orders placed or performed during the hospital encounter of 02/10/21 (from the past 48 hour(s))  CBC with Differential/Platelet     Status: Abnormal   Collection Time: 02/10/21  9:09 PM  Result Value Ref Range   WBC 13.5 (H) 4.0 - 10.5 K/uL   RBC 3.79 (L) 3.87 - 5.11 MIL/uL   Hemoglobin 12.1 12.0 - 15.0 g/dL   HCT 36.6 36.0 - 46.0 %   MCV 96.6 80.0 - 100.0 fL   MCH 31.9 26.0 - 34.0  pg   MCHC 33.1 30.0 - 36.0 g/dL   RDW 12.9 11.5 - 15.5 %   Platelets 123 (L) 150 - 400 K/uL    Comment: Immature Platelet Fraction may be clinically indicated, consider ordering this additional test [id number]    nRBC 0.0 0.0 - 0.2 %   Neutrophils Relative % 90 %   Neutro Abs 12.0 (H) 1.7 - 7.7 K/uL   Lymphocytes Relative 4 %   Lymphs Abs 0.6 (L) 0.7 - 4.0 K/uL   Monocytes Relative 6 %   Monocytes Absolute 0.8 0.1 - 1.0 K/uL   Eosinophils Relative 0 %   Eosinophils Absolute 0.0 0.0 - 0.5 K/uL   Basophils Relative 0 %   Basophils Absolute 0.0 0.0 - 0.1 K/uL   Immature Granulocytes 0 %   Abs Immature Granulocytes 0.04 0.00 - 0.07 K/uL    Comment: Performed at [facility] [address], [place], [place] [address]  Comprehensive metabolic panel     Status: Abnormal   Collection  Time: 02/10/21  9:09 PM  Result Value Ref Range   Sodium 136 135 - 145 mmol/L   Potassium 3.6 3.5 - 5.1 mmol/L   Chloride 101 98 - 111 mmol/L   CO2 15 (L) 22 - 32 mmol/L   Glucose, Bld 125 (H) 70 - 99 mg/dL    Comment: Glucose reference range applies only to samples taken after fasting for at least 8 hours.   BUN 14 8 - 23 mg/dL   Creatinine, Ser 1.05 (H) 0.44 - 1.00 mg/dL   Calcium 8.9 8.9 - 10.3 mg/dL   Total Protein 7.9 6.5 - 8.1 g/dL   Albumin 4.3 3.5 - 5.0 g/dL   AST 50 (H) 15 - 41 U/L   ALT 19 0 - 44 U/L   Alkaline Phosphatase 64 38 - 126 U/L   Total Bilirubin 1.9 (H) 0.3 - 1.2 mg/dL   GFR, Estimated 59 (L) >60 mL/min    Comment: (NOTE) Calculated using the CKD-EPI Creatinine Equation (2021)    Anion gap 20 (H) 5 - 15    Comment: Performed at [facility] [address], [place], [place] [address]  Troponin I (High Sensitivity)     Status: None   Collection Time: 02/10/21  9:09 PM  Result Value Ref Range   Troponin I (High Sensitivity) 10 <18 ng/L    Comment: (NOTE) Elevated high sensitivity troponin I (hsTnI) values and significant  changes across serial measurements may suggest ACS but many other  chronic and acute conditions are known to elevate hsTnI results.  Refer to the "Links" section for chest pain algorithms and additional  guidance. Performed at [facility], [place] [address], [place], [place] [address]   CK     Status: Abnormal   Collection Time: 02/10/21  9:09 PM  Result Value Ref Range   Total CK 662 (H) 38 - 234 U/L    Comment: Performed at [facility], [place] [address] [place], [place] [address]  Ethanol     Status: None   Collection Time: 02/10/21  9:15 PM  Result Value Ref Range   Alcohol, Ethyl (B) <10 <10 mg/dL    Comment: (NOTE) Lowest detectable limit for serum alcohol is 10 mg/dL.  For medical purposes only. Performed at [facility], [place] [address] [place], [place] [address]   I-stat chem 8, ED (not at [facility] or  [facility])     Status: Abnormal  Collection Time: 02/10/21 10:18 PM  Result Value Ref Range   Sodium 138 135 - 145 mmol/L   Potassium 3.8 3.5 - 5.1 mmol/L   Chloride 105 98 - 111 mmol/L   BUN 16 8 - 23 mg/dL   Creatinine, Ser 0.60 0.44 - 1.00 mg/dL   Glucose, Bld 123 (H) 70 - 99 mg/dL    Comment: Glucose reference range applies only to samples taken after fasting for at least 8 hours.   Calcium, Ion 1.15 1.15 - 1.40 mmol/L   TCO2 16 (L) 22 - 32 mmol/L   Hemoglobin 13.9 12.0 - 15.0 g/dL   HCT 41.0 36.0 - 46.0 %  Resp Panel by RT-PCR (Flu A&B, Covid) Nasopharyngeal Swab     Status: None   Collection Time: 02/10/21 11:30 PM   Specimen: Nasopharyngeal Swab; Nasopharyngeal(NP) swabs in vial transport medium  Result Value Ref Range   SARS Coronavirus 2 by RT PCR NEGATIVE NEGATIVE    Comment: (NOTE) SARS-CoV-2 target nucleic acids are NOT DETECTED.  The SARS-CoV-2 RNA is generally detectable in upper respiratory specimens during the acute phase of infection. The lowest concentration of SARS-CoV-2 viral copies this assay can detect is 138 copies/mL. A negative result does not preclude SARS-Cov-2 infection and should not be used as the sole basis for treatment or other patient management decisions. A negative result may occur with  improper specimen collection/handling, submission of specimen other than nasopharyngeal swab, presence of viral mutation(s) within the areas targeted by this assay, and inadequate number of viral copies(<138 copies/mL). A negative result must be combined with clinical observations, patient history, and epidemiological information. The expected result is Negative.  Fact Sheet for Patients:  EntrepreneurPulse.com.au  Fact Sheet for Healthcare Providers:  IncredibleEmployment.be  This test is no t yet approved or cleared by the [place] FDA and  has been authorized for detection and/or diagnosis of SARS-CoV-2 by FDA under  an Emergency Use Authorization (EUA). This EUA will remain  in effect (meaning this test can be used) for the duration of the COVID-19 declaration under Section 564(b)(1) of the Act, 21 U.S.C.section 360bbb-3(b)(1), unless the authorization is terminated  or revoked sooner.       Influenza A by PCR NEGATIVE NEGATIVE   Influenza B by PCR NEGATIVE NEGATIVE    Comment: (NOTE) The Xpert Xpress SARS-CoV-2/FLU/RSV plus assay is intended as an aid in the diagnosis of influenza from Nasopharyngeal swab specimens and should not be used as a sole basis for treatment. Nasal washings and aspirates are unacceptable for Xpert Xpress SARS-CoV-2/FLU/RSV testing.  Fact Sheet for Patients: EntrepreneurPulse.com.au  Fact Sheet for Healthcare Providers: IncredibleEmployment.be  This test is not yet approved or cleared by the [place] FDA and has been authorized for detection and/or diagnosis of SARS-CoV-2 by FDA under an Emergency Use Authorization (EUA). This EUA will remain in effect (meaning this test can be used) for the duration of the COVID-19 declaration under Section 564(b)(1) of the Act, 21 U.S.C. section 360bbb-3(b)(1), unless the authorization is terminated or revoked.  Performed at [facility], [place] [address] [place], [place] [address]   Troponin I (High Sensitivity)     Status: None   Collection Time: 02/11/21  2:10 AM  Result Value Ref Range   Troponin I (High Sensitivity) 11 <18 ng/L    Comment: (NOTE) Elevated high sensitivity troponin I (hsTnI) values and significant  changes across serial measurements may suggest ACS but many other  chronic and acute conditions are known  to elevate hsTnI results.  Refer to the "Links" section for chest pain algorithms and additional  guidance. Performed at [facility], [place] [address], [place], [place] [address]   Basic metabolic panel     Status: Abnormal   Collection Time: 02/11/21   3:33 AM  Result Value Ref Range   Sodium 139 135 - 145 mmol/L   Potassium 4.5 3.5 - 5.1 mmol/L    Comment: SPECIMEN HEMOLYZED. HEMOLYSIS MAY AFFECT INTEGRITY OF RESULTS.   Chloride 107 98 - 111 mmol/L   CO2 17 (L) 22 - 32 mmol/L   Glucose, Bld 95 70 - 99 mg/dL    Comment: Glucose reference range applies only to samples taken after fasting for at least 8 hours.   BUN 11 8 - 23 mg/dL   Creatinine, Ser 0.95 0.44 - 1.00 mg/dL   Calcium 8.4 (L) 8.9 - 10.3 mg/dL   GFR, Estimated >60 >60 mL/min    Comment: (NOTE) Calculated using the CKD-EPI Creatinine Equation (2021)    Anion gap 15 5 - 15    Comment: Performed at [facility] [address], [place], [place] [address]  CBC     Status: Abnormal   Collection Time: 02/11/21  3:33 AM  Result Value Ref Range   WBC 13.4 (H) 4.0 - 10.5 K/uL   RBC 3.69 (L) 3.87 - 5.11 MIL/uL   Hemoglobin 11.9 (L) 12.0 - 15.0 g/dL   HCT 36.1 36.0 - 46.0 %   MCV 97.8 80.0 - 100.0 fL   MCH 32.2 26.0 - 34.0 pg   MCHC 33.0 30.0 - 36.0 g/dL   RDW 12.9 11.5 - 15.5 %   Platelets 90 (L) 150 - 400 K/uL    Comment: Immature Platelet Fraction may be clinically indicated, consider ordering this additional test [id number] REPEATED TO VERIFY PLATELET COUNT CONFIRMED BY SMEAR    nRBC 0.0 0.0 - 0.2 %    Comment: Performed at [facility], [place] [address], [place], [place] [address]  TSH     Status: None   Collection Time: 02/11/21  3:33 AM  Result Value Ref Range   TSH 3.348 0.350 - 4.500 uIU/mL    Comment: Performed by a 3rd Generation assay with a functional sensitivity of <=0.01 uIU/mL. Performed at [facility], [place] [address], [place], [place] [address]     CT HEAD WO CONTRAST (5MM)  Result Date: 02/10/2021 CLINICAL DATA:  Head trauma found lying on floor EXAM: CT HEAD WITHOUT CONTRAST CT CERVICAL SPINE WITHOUT CONTRAST TECHNIQUE: Multidetector CT imaging of the head and cervical spine was performed following the standard protocol without  intravenous contrast. Multiplanar CT image reconstructions of the cervical spine were also generated. COMPARISON:  CT brain and cervical spine 01/30/2018 FINDINGS: CT HEAD FINDINGS Brain: No acute territorial infarction, hemorrhage or intracranial mass. Stable left cerebellar calcification. Moderate atrophy. Mild chronic small vessel ischemic changes of the white matter. Stable ventricle size Vascular: No hyperdense vessels.  No unexpected calcification Skull: Normal. Negative for fracture or focal lesion. Sinuses/Orbits: No acute finding. Other: None CT CERVICAL SPINE FINDINGS Alignment: No subluxation.  Facet alignment within normal limits Skull base and vertebrae: No acute fracture. No primary bone lesion or focal pathologic process. Soft tissues and spinal canal: No prevertebral fluid or swelling. No visible canal hematoma. Disc levels: Multilevel degenerative change. Moderate disc space narrowing C4-C5. Advanced degenerative changes C6-C7. Hypertrophic facet degenerative changes at multiple levels. Upper chest: Lung apices are  clear. 1.9 cm hypodense nodule right lobe of thyroid and 17 mm hypodense nodule left lobe of thyroid. Other: None IMPRESSION: 1. No CT evidence for acute intracranial abnormality. Stable atrophy. 2. Degenerative changes of the cervical spine. No acute osseous abnormality 3. 1.9 cm hypodense nodule right lobe of thyroid. Recommend thyroid US (ref: J Am Coll Radiol. 2015 Feb;12(2): 143-50).This may be performed non emergently Electronically Signed   By: Donavan Foil M.D.   On: 02/10/2021 22:39   CT Cervical Spine Wo Contrast  Result Date: 02/10/2021 CLINICAL DATA:  Head trauma found lying on floor EXAM: CT HEAD WITHOUT CONTRAST CT CERVICAL SPINE WITHOUT CONTRAST TECHNIQUE: Multidetector CT imaging of the head and cervical spine was performed following the standard protocol without intravenous contrast. Multiplanar CT image reconstructions of the cervical spine were also generated.  COMPARISON:  CT brain and cervical spine 01/30/2018 FINDINGS: CT HEAD FINDINGS Brain: No acute territorial infarction, hemorrhage or intracranial mass. Stable left cerebellar calcification. Moderate atrophy. Mild chronic small vessel ischemic changes of the white matter. Stable ventricle size Vascular: No hyperdense vessels.  No unexpected calcification Skull: Normal. Negative for fracture or focal lesion. Sinuses/Orbits: No acute finding. Other: None CT CERVICAL SPINE FINDINGS Alignment: No subluxation.  Facet alignment within normal limits Skull base and vertebrae: No acute fracture. No primary bone lesion or focal pathologic process. Soft tissues and spinal canal: No prevertebral fluid or swelling. No visible canal hematoma. Disc levels: Multilevel degenerative change. Moderate disc space narrowing C4-C5. Advanced degenerative changes C6-C7. Hypertrophic facet degenerative changes at multiple levels. Upper chest: Lung apices are clear. 1.9 cm hypodense nodule right lobe of thyroid and 17 mm hypodense nodule left lobe of thyroid. Other: None IMPRESSION: 1. No CT evidence for acute intracranial abnormality. Stable atrophy. 2. Degenerative changes of the cervical spine. No acute osseous abnormality 3. 1.9 cm hypodense nodule right lobe of thyroid. Recommend thyroid US (ref: J Am Coll Radiol. 2015 Feb;12(2): 143-50).This may be performed non emergently Electronically Signed   By: Donavan Foil M.D.   On: 02/10/2021 22:39   CT Lumbar Spine Wo Contrast  Result Date: 02/10/2021 CLINICAL DATA:  Found on floor EXAM: CT LUMBAR SPINE WITHOUT CONTRAST TECHNIQUE: Multidetector CT imaging of the lumbar spine was performed without intravenous contrast administration. Multiplanar CT image reconstructions were also generated. COMPARISON:  CT 09/26/2020 FINDINGS: Segmentation: 5 lumbar type vertebrae. Alignment: Normal. Vertebrae: Vertebral body heights appear normal. Acute appearing left transverse process fracture at L1.  Paraspinal and other soft tissues: No paravertebral or paraspinal soft tissue abnormality is evident. Aortic atherosclerosis. Acute mildly displaced left twelfth posteromedial rib fracture. Disc levels: At T12-L1, maintained disc space. No canal stenosis. Foramen are patent. At L1-L2, maintained disc space. No canal stenosis. The foramen are patent. At L2-L3, mild disc space narrowing. No canal stenosis. The foramen are patent bilaterally. At L3-L4, maintained disc space. Left foraminal and lateral disc protrusion. No significant foraminal narrowing. No canal stenosis. Mild facet degenerative changes. At L4-L5, maintained disc space. No canal stenosis. Mild facet degenerative changes. The foramen are patent bilaterally. At L5-S1, mild to moderate disc space narrowing. Posterior disc osteophyte complex. No canal stenosis. Facet degenerative changes. The foramen are patent bilaterally. IMPRESSION: 1. Acute appearing mildly displaced left transverse process fracture at L1. Adjacent acute mildly displaced left twelfth posteromedial rib fracture 2. Minimal degenerative changes. Electronically Signed   By: Donavan Foil M.D.   On: 02/10/2021 22:46   DG Pelvis Portable  Result Date: 02/10/2021 CLINICAL DATA:  Fall EXAM: PORTABLE PELVIS 1-2 VIEWS COMPARISON:  None. FINDINGS: Hip joints and SI joints are symmetric. No acute bony abnormality. Specifically, no fracture, subluxation, or dislocation. IMPRESSION: Negative. Electronically Signed   By: Rolm Baptise M.D.   On: 02/10/2021 21:40   DG Chest Port 1 View  Result Date: 02/10/2021 CLINICAL DATA:  Found on floor. EXAM: PORTABLE CHEST 1 VIEW COMPARISON:  11/20/2020 FINDINGS: Heart and mediastinal contours are within normal limits. No focal opacities or effusions. No acute bony abnormality. IMPRESSION: No active disease. Electronically Signed   By: Rolm Baptise M.D.   On: 02/10/2021 21:39   DG Ankle Left Port  Result Date: 02/10/2021 CLINICAL DATA:  Found on  floor EXAM: PORTABLE LEFT ANKLE - 2 VIEW COMPARISON:  None. FINDINGS: Bimalleolar fracture with oblique fracture through the distal fibular metaphysis and transverse fracture through the medial malleolus. Widening of the medial ankle mortise. No dislocation. Diffuse soft tissue swelling. IMPRESSION: Bimalleolar fracture with widening of the medial ankle mortise. Electronically Signed   By: Rolm Baptise M.D.   On: 02/10/2021 21:40   US THYROID  Result Date: 02/11/2021 CLINICAL DATA:  Single thyroid nodule EXAM: THYROID ULTRASOUND TECHNIQUE: Ultrasound examination of the thyroid gland and adjacent soft tissues was performed. COMPARISON:  11/21/2012 FINDINGS: Parenchymal Echotexture: Mildly heterogeneous Isthmus: 0.3 cm Right lobe: 3.9 x 2.4 x 1.6 cm Left lobe: 3.2 x 1.5 x 1.3 cm _________________________________________________________ Estimated total number of nodules >/= 1 cm: 2 Number of spongiform nodules >/=  2 cm not described below (TR1): 0 Number of mixed cystic and solid nodules >/= 1.5 cm not described below ([place]): 0 _________________________________________________________ Nodule # 1: Location: Right; inferior Maximum size: 1.9 cm; Other 2 dimensions: 1.6 x 1.6 cm Composition: solid/almost completely solid (2) Echogenicity: isoechoic (1) Shape: not taller-than-wide (0) Margins: smooth (0) Echogenic foci: none (0) ACR TI-RADS total points: 3. ACR TI-RADS risk category: TR3 (3 points). ACR TI-RADS recommendations: *Given size (>/= 1.5 - 2.4 cm) and appearance, a follow-up ultrasound in 1 year should be considered based on TI-RADS criteria. _________________________________________________________ Nodule # 2: Prior biopsy: No Location: Left; inferior Maximum size: 1.7 cm; Other 2 dimensions: 1.3 x 0.9 cm, previously, 1.3 x 1.1 x 1.1 cm Composition: solid/almost completely solid (2) Echogenicity: isoechoic (1) Shape: not taller-than-wide (0) Margins: smooth (0) Echogenic foci: none (0) ACR TI-RADS total  points: 3. ACR TI-RADS risk category:  TR3 (3 points). Significant change in size (>/= 20% in two dimensions and minimal increase of 2 mm): No Change in features: No Change in ACR TI-RADS risk category: No ACR TI-RADS recommendations: Given stability since 2014, findings are consistent with a benign etiology. _________________________________________________________ IMPRESSION: 1. Nodule 1 (TI-RADS 3) is new compared to prior thyroid ultrasound. It meets criteria for imaging follow-up. Annual ultrasound surveillance is recommended until 5 years of stability is documented. 2. Nodule 2 is not significantly changed in size since 2014 which indicates a benign etiology. The above is in keeping with the ACR TI-RADS recommendations - J Am Coll Radiol 2017;14:587-595. Electronically Signed   By: Miachel Roux M.D.   On: 02/11/2021 07:43    Review of Systems  HENT:  Negative for ear discharge, ear pain, hearing loss and tinnitus.   Eyes:  Negative for photophobia and pain.  Respiratory:  Negative for cough and shortness of breath.   Cardiovascular:  Negative for chest pain.  Gastrointestinal:  Negative for abdominal pain, nausea and vomiting.  Genitourinary:  Negative for dysuria, flank pain, frequency and  urgency.  Musculoskeletal:  Positive for arthralgias (Left ankle). Negative for back pain, myalgias and neck pain.  Neurological:  Negative for dizziness and headaches.  Hematological:  Does not bruise/bleed easily.  Psychiatric/Behavioral:  The patient is not nervous/anxious.   Blood pressure 125/74, pulse (!) 120, temperature 98.1 F (36.7 C), temperature source Oral, resp. rate (!) 24, SpO2 100 %. Physical Exam Constitutional:      General: She is not in acute distress.    Appearance: She is well-developed. She is not diaphoretic.  HENT:     Head: Normocephalic and atraumatic.  Eyes:     General: No scleral icterus.       Right eye: No discharge.        Left eye: No discharge.      Conjunctiva/sclera: Conjunctivae normal.  Cardiovascular:     Rate and Rhythm: Normal rate and regular rhythm.  Pulmonary:     Effort: Pulmonary effort is normal. No respiratory distress.  Musculoskeletal:     Cervical back: Normal range of motion.     Comments: LLE No traumatic wounds, ecchymosis, or rash  Short leg splint in place  No knee effusion  Knee stable to varus/ valgus and anterior/posterior stress  Sens DPN, SPN, TN intact  Motor EHL 5/5  Toes perfused, No significant edema  Skin:    General: Skin is warm and dry.  Neurological:     Mental Status: She is alert.  Psychiatric:        Mood and Affect: Mood normal.        Behavior: Behavior normal.    Assessment/Plan: Left ankle fx -- Plan ORIF today by Dr. Marcelino Scot. Please keep NPO. Other injuries including thoracic TVP fx and rib fx Multiple medical problems including alcoholism, HTN, GERD, and osteoporosis -- per primary service    Lisette Abu, PA-C Orthopedic Surgery [phone] 02/11/2021, 9:05 AM

## 2021-02-11 NOTE — Plan of Care (Signed)

## 2021-02-11 NOTE — ED Notes (Signed)
Provider at bedside talking with patient.

## 2021-02-11 NOTE — ED Notes (Signed)
Provider at bedside

## 2021-02-11 NOTE — H&P (Signed)
History and Physical    Terri Ayala NWG:[id number] DOB: [birth year] DOA: 02/10/2021  PCP: Lorrene Reid, PA-C   Patient coming from: Home  Chief Complaint: [place] at home  HPI: Terri Ayala is a 65 y.o. female with medical history significant for alcoholism, HTN, GERD, osteoporosis who presents by EMS after a fall at home.  She reports that she stopped drinking alcohol a week ago.  She has been drinking 2-3 bottles of wine a day she reports.  She states she has been having some confusion and more tremors than normal but she has been able to walk with the help of her walker.  She does report having pain in her left ankle after she twisted it and says "sprained it" last week.  Yesterday afternoon she fell at home and laid on the floor for a few hours.  Her mother tried to call her throughout the day and was unable to get in touch with her so called her neighbors who came and checked on her and found her on the floor.  She denies having any head trauma and did not have any loss of consciousness.  States she has not had any chest pain, palpitations nausea or vomiting.  She reports she has been admitted in the past for alcohol withdrawals.  ED Course: [name] has been hemodynamically stable in the emergency room.  She was found to have fracture of her left ankle as well as an L1 transverse process fracture.  Orthopedic surgeries been consulted and will be seeing patient.  Patient has had tremors and elevated CIWA score and Ativan was ordered in the emergency room.  Elevated CK level of 662 with a normal troponin of 10.  WBC 13,500 hemoglobin 12.1 hematocrit 36.6 platelets 223,000, sodium 136 potassium 3.6 chloride 101 bicarb 15 creatinine 1.05 BUN 14 glucose 125 alkaline phosphatase 64 AST 50 ALT 19 total bilirubin 1.9.  Hospitalist service asked to admit for further management.  Splint for left ankle fractures been ordered by the ER physician.   Review of Systems:  General: Denies fever, chills,  weight loss, night sweats. Denies dizziness  Denies change in appetite HENT: Denies head trauma, headache, denies change in hearing, tinnitus.  Denies nasal congestion or bleeding.  Denies sore throat.  Denies difficulty swallowing Eyes: Denies blurry vision, pain in eye, drainage.  Denies discoloration of eyes. Neck: Denies pain.  Denies swelling.  Denies pain with movement. Cardiovascular: Denies chest pain, palpitations.  Denies edema.  Denies orthopnea Respiratory: Denies shortness of breath, cough.  Denies wheezing.  Denies sputum production Gastrointestinal: Denies abdominal pain, swelling.  Denies nausea, vomiting, diarrhea.  Denies melena.  Denies hematemesis. Musculoskeletal: Has bruising, swelling, pain and limitation of movement of left ankle.  Denies myalgias. Genitourinary: Denies pelvic pain.  Denies urinary frequency or hesitancy.  Denies dysuria.  Skin: Denies rash.  Denies petechiae, purpura, ecchymosis. Neurological: Denies syncope.  Denies seizure activity.  Denies  paresthesia.  Denies slurred speech, drooping face.  Denies visual change. Psychiatric: Denies depression, anxiety.   Denies hallucinations.  Past Medical History:  Diagnosis Date   Alcohol withdrawal ([place]) 11/2017; 01/04/2018   Alcoholism ([place])    Anxiety    Arthritis    "hands" (01/04/2018)   ASYMPTOMATIC POSTMENOPAUSAL STATUS 08/17/2009   Chronic lower back pain    Depression    DYSPHAGIA 06/19/2007   Fatty liver, alcoholic    GERD (gastroesophageal reflux disease)    GOITER, MULTINODULAR 08/17/2009   Hyperlipidemia    Migraines    "  not sure what triggers them; I'll have 1 q couple weeks or more; come 2 days in a row when they come" (01/04/2018)   Mitral valve prolapse    OSTEOPOROSIS 08/17/2009   PONV (postoperative nausea and vomiting)    Supraventricular tachycardia ([place])     Past Surgical History:  Procedure Laterality Date   BREAST BIOPSY Right    "benign"   CATARACT EXTRACTION W/ INTRAOCULAR  LENS  IMPLANT, BILATERAL     CERVICAL CONE BIOPSY  2000s   CERVIX LESION DESTRUCTION  1991   "dysplasia; lesions"   CHOLECYSTECTOMY N/A 03/14/2013   Procedure: LAPAROSCOPIC CHOLECYSTECTOMY WITH ATTEMPTED INTRAOPERATIVE CHOLANGIOGRAM;  Surgeon: Harl Bowie, MD;  Location: [facility];  Service: General;  Laterality: N/A;   laporoscopic abdominal surgery     "for endometriosis"   LEFT HEART CATH AND CORONARY ANGIOGRAPHY N/A 05/07/2018   Procedure: LEFT HEART CATH AND CORONARY ANGIOGRAPHY;  Surgeon: Belva Crome, MD;  Location: [facility];  Service: Cardiovascular;  Laterality: N/A;    Social History  reports that she quit smoking about 46 years ago. Her smoking use included cigarettes. She has a 7.00 pack-year smoking history. She has never used smokeless tobacco. She reports current alcohol use of about 28.0 standard drinks per week. She reports that she does not use drugs.  Allergies  Allergen Reactions   Doxycycline Swelling    Mouth swelling and sores   Codeine Itching   Tetracycline Hcl Swelling    Mouth swelling and sores    Family History  Problem Relation Age of Onset   Colon cancer Father        Deceased, 69   Osteoporosis Mother        Living, 89   Healthy Brother    Healthy Brother    Healthy Son    Healthy Son    Thyroid disease Neg Hx    Goiter Neg Hx      Prior to Admission medications   Medication Sig Start Date End Date Taking? Authorizing Provider  acetaminophen (TYLENOL) 325 MG tablet Take 2 tablets (650 mg total) by mouth every 6 (six) hours as needed for moderate pain. 09/30/20   Nita Sells, MD  albuterol (VENTOLIN HFA) 108 (90 Base) MCG/ACT inhaler Inhale 2 puffs into the lungs every 6 (six) hours as needed for wheezing or shortness of breath. 10/19/20   Ronnell Freshwater, NP  alendronate (FOSAMAX) 70 MG tablet Take 70 mg by mouth every Monday. Take with a full glass of water on an empty stomach.    [provider]  busPIRone  (BUSPAR) 15 MG tablet Take 15 mg by mouth 3 (three) times daily.    [provider]  diazepam (VALIUM) 5 MG tablet 1 tab two times a day for 2 days 1 tab daily for 2 days 12/11/20   Ronnell Freshwater, NP  DULoxetine (CYMBALTA) 60 MG capsule TAKE 1 CAPSULE (60 MG TOTAL) BY MOUTH DAILY. Patient taking differently: Take 60 mg by mouth daily. 10/23/14   Denita Lung, MD  folic acid (FOLVITE) 1 MG tablet Take 1 tablet (1 mg total) by mouth daily. 11/23/20 02/21/21  Barb Merino, MD  gabapentin (NEURONTIN) 100 MG capsule Take 1 capsule (100 mg total) by mouth 3 (three) times daily. 11/23/20 12/23/20  Barb Merino, MD  hydrOXYzine (ATARAX/VISTARIL) 25 MG tablet Take 25 mg by mouth 3 (three) times daily.    [provider]  meloxicam (MOBIC) 15 MG tablet Take 15 mg  by mouth daily. 11/30/20   [provider]  Multiple Vitamin (MULTIVITAMIN WITH MINERALS) TABS tablet Take 1 tablet by mouth daily. 01/10/18   Mikhail, Velta Addison, DO  norethindrone-ethinyl estradiol (FEMHRT 1/5) 1-5 MG-MCG TABS Take 1 tablet by mouth daily. For Osteoporosis 04/07/14   Lindell Spar I, NP  pantoprazole (PROTONIX) 40 MG tablet TAKE 1 TABLET BY MOUTH 2 TIMES DAILY. 01/11/21   Abonza, [name], PA-C  propranolol (INDERAL) 10 MG tablet TAKE 1 TABLET BY MOUTH 2 TIMES DAILY. 01/26/21   Abonza, [name], PA-C  SUMAtriptan (IMITREX) 100 MG tablet TAKE 1 TABLET BY MOUTH AT THE FIRST SIGN OF MIGRAINE, MAY REPEAT IN 2 HOURS. MAX 2 TABLETS IN 24 HOURS 11/30/20   Abonza, Maritza, PA-C  traZODone (DESYREL) 50 MG tablet Take 50 mg by mouth at bedtime.    [provider]    Physical Exam: Vitals:   02/10/21 2052 02/10/21 2200  BP: (!) 162/97 (!) 117/98  Pulse: (!) 102 (!) 105  Resp: 17 17  Temp: 98.1 F (36.7 C)   TempSrc: Oral   SpO2: 100% 99%    Constitutional: NAD, calm, comfortable Vitals:   02/10/21 2052 02/10/21 2200  BP: (!) 162/97 (!) 117/98  Pulse: (!) 102 (!) 105  Resp: 17 17  Temp: 98.1  F (36.7 C)   TempSrc: Oral   SpO2: 100% 99%   General: WDWN, Alert and oriented x3.  Eyes: EOMI, PERRL, conjunctivae normal.  Sclera mildly icteric. No nystagmus. HENT:  [place]/AT, external ears normal.  Nares patent without epistasis.  Mucous membranes are dry Neck: Soft, normal range of motion, supple, no masses, mild thyromegaly. Trachea midline Respiratory: clear to auscultation bilaterally, no wheezing, no crackles. Normal respiratory effort. No accessory muscle use.  Cardiovascular: Regular rate and rhythm, no murmurs / rubs / gallops. No extremity edema. 2+ pedal pulses.   Abdomen: Soft, no tenderness, nondistended, no rebound or guarding.  No masses palpated. Bowel sounds normoactive Musculoskeletal: FROM. no cyanosis. Has ecchymosis and swelling of left ankle.  Left ankle tender to palpation.  Normal muscle tone.  Skin: Warm, dry, intact no rashes, lesions, ulcers. No induration Neurologic: CN 2-12 grossly intact.  Normal speech. Bilateral tremors of upper extremities. Sensation intact, Strength 5/5 in all extremities.   Psychiatric: Normal judgment and insight.  Normal mood.    Labs on Admission: I have personally reviewed following labs and imaging studies  CBC: Recent Labs  Lab 02/10/21 2109 02/10/21 2218  WBC 13.5*  --   NEUTROABS 12.0*  --   HGB 12.1 13.9  HCT 36.6 41.0  MCV 96.6  --   PLT 123*  --     Basic Metabolic Panel: Recent Labs  Lab 02/10/21 2109 02/10/21 2218  NA 136 138  K 3.6 3.8  CL 101 105  CO2 15*  --   GLUCOSE 125* 123*  BUN 14 16  CREATININE 1.05* 0.60  CALCIUM 8.9  --     GFR: CrCl cannot be calculated (Unknown ideal weight.).  Liver Function Tests: Recent Labs  Lab 02/10/21 2109  AST 50*  ALT 19  ALKPHOS 64  BILITOT 1.9*  PROT 7.9  ALBUMIN 4.3    Urine analysis:    Component Value Date/Time   COLORURINE YELLOW 09/26/2020 1730   APPEARANCEUR CLEAR 09/26/2020 1730   LABSPEC 1.039 (H) 09/26/2020 1730   LABSPEC 1.030  09/01/2017 1443   PHURINE 5.0 09/26/2020 1730   GLUCOSEU NEGATIVE 09/26/2020 1730   HGBUR SMALL (A) 09/26/2020 1730  BILIRUBINUR NEGATIVE 09/26/2020 1730   BILIRUBINUR neg 08/28/2019 1612   KETONESUR 20 (A) 09/26/2020 1730   PROTEINUR 100 (A) 09/26/2020 1730   UROBILINOGEN 0.2 08/28/2019 1612   UROBILINOGEN 0.2 07/25/2007 0900   NITRITE NEGATIVE 09/26/2020 1730   LEUKOCYTESUR SMALL (A) 09/26/2020 1730    Radiological Exams on Admission: CT HEAD WO CONTRAST (5MM)  Result Date: 02/10/2021 CLINICAL DATA:  Head trauma found lying on floor EXAM: CT HEAD WITHOUT CONTRAST CT CERVICAL SPINE WITHOUT CONTRAST TECHNIQUE: Multidetector CT imaging of the head and cervical spine was performed following the standard protocol without intravenous contrast. Multiplanar CT image reconstructions of the cervical spine were also generated. COMPARISON:  CT brain and cervical spine 01/30/2018 FINDINGS: CT HEAD FINDINGS Brain: No acute territorial infarction, hemorrhage or intracranial mass. Stable left cerebellar calcification. Moderate atrophy. Mild chronic small vessel ischemic changes of the white matter. Stable ventricle size Vascular: No hyperdense vessels.  No unexpected calcification Skull: Normal. Negative for fracture or focal lesion. Sinuses/Orbits: No acute finding. Other: None CT CERVICAL SPINE FINDINGS Alignment: No subluxation.  Facet alignment within normal limits Skull base and vertebrae: No acute fracture. No primary bone lesion or focal pathologic process. Soft tissues and spinal canal: No prevertebral fluid or swelling. No visible canal hematoma. Disc levels: Multilevel degenerative change. Moderate disc space narrowing C4-C5. Advanced degenerative changes C6-C7. Hypertrophic facet degenerative changes at multiple levels. Upper chest: Lung apices are clear. 1.9 cm hypodense nodule right lobe of thyroid and 17 mm hypodense nodule left lobe of thyroid. Other: None IMPRESSION: 1. No CT evidence for acute  intracranial abnormality. Stable atrophy. 2. Degenerative changes of the cervical spine. No acute osseous abnormality 3. 1.9 cm hypodense nodule right lobe of thyroid. Recommend thyroid US (ref: J Am Coll Radiol. 2015 Feb;12(2): 143-50).This may be performed non emergently Electronically Signed   By: Donavan Foil M.D.   On: 02/10/2021 22:39   CT Cervical Spine Wo Contrast  Result Date: 02/10/2021 CLINICAL DATA:  Head trauma found lying on floor EXAM: CT HEAD WITHOUT CONTRAST CT CERVICAL SPINE WITHOUT CONTRAST TECHNIQUE: Multidetector CT imaging of the head and cervical spine was performed following the standard protocol without intravenous contrast. Multiplanar CT image reconstructions of the cervical spine were also generated. COMPARISON:  CT brain and cervical spine 01/30/2018 FINDINGS: CT HEAD FINDINGS Brain: No acute territorial infarction, hemorrhage or intracranial mass. Stable left cerebellar calcification. Moderate atrophy. Mild chronic small vessel ischemic changes of the white matter. Stable ventricle size Vascular: No hyperdense vessels.  No unexpected calcification Skull: Normal. Negative for fracture or focal lesion. Sinuses/Orbits: No acute finding. Other: None CT CERVICAL SPINE FINDINGS Alignment: No subluxation.  Facet alignment within normal limits Skull base and vertebrae: No acute fracture. No primary bone lesion or focal pathologic process. Soft tissues and spinal canal: No prevertebral fluid or swelling. No visible canal hematoma. Disc levels: Multilevel degenerative change. Moderate disc space narrowing C4-C5. Advanced degenerative changes C6-C7. Hypertrophic facet degenerative changes at multiple levels. Upper chest: Lung apices are clear. 1.9 cm hypodense nodule right lobe of thyroid and 17 mm hypodense nodule left lobe of thyroid. Other: None IMPRESSION: 1. No CT evidence for acute intracranial abnormality. Stable atrophy. 2. Degenerative changes of the cervical spine. No acute osseous  abnormality 3. 1.9 cm hypodense nodule right lobe of thyroid. Recommend thyroid US (ref: J Am Coll Radiol. 2015 Feb;12(2): 143-50).This may be performed non emergently Electronically Signed   By: Donavan Foil M.D.   On: 02/10/2021 22:39  CT Lumbar Spine Wo Contrast  Result Date: 02/10/2021 CLINICAL DATA:  Found on floor EXAM: CT LUMBAR SPINE WITHOUT CONTRAST TECHNIQUE: Multidetector CT imaging of the lumbar spine was performed without intravenous contrast administration. Multiplanar CT image reconstructions were also generated. COMPARISON:  CT 09/26/2020 FINDINGS: Segmentation: 5 lumbar type vertebrae. Alignment: Normal. Vertebrae: Vertebral body heights appear normal. Acute appearing left transverse process fracture at L1. Paraspinal and other soft tissues: No paravertebral or paraspinal soft tissue abnormality is evident. Aortic atherosclerosis. Acute mildly displaced left twelfth posteromedial rib fracture. Disc levels: At T12-L1, maintained disc space. No canal stenosis. Foramen are patent. At L1-L2, maintained disc space. No canal stenosis. The foramen are patent. At L2-L3, mild disc space narrowing. No canal stenosis. The foramen are patent bilaterally. At L3-L4, maintained disc space. Left foraminal and lateral disc protrusion. No significant foraminal narrowing. No canal stenosis. Mild facet degenerative changes. At L4-L5, maintained disc space. No canal stenosis. Mild facet degenerative changes. The foramen are patent bilaterally. At L5-S1, mild to moderate disc space narrowing. Posterior disc osteophyte complex. No canal stenosis. Facet degenerative changes. The foramen are patent bilaterally. IMPRESSION: 1. Acute appearing mildly displaced left transverse process fracture at L1. Adjacent acute mildly displaced left twelfth posteromedial rib fracture 2. Minimal degenerative changes. Electronically Signed   By: Donavan Foil M.D.   On: 02/10/2021 22:46   DG Pelvis Portable  Result Date:  02/10/2021 CLINICAL DATA:  Fall EXAM: PORTABLE PELVIS 1-2 VIEWS COMPARISON:  None. FINDINGS: Hip joints and SI joints are symmetric. No acute bony abnormality. Specifically, no fracture, subluxation, or dislocation. IMPRESSION: Negative. Electronically Signed   By: Rolm Baptise M.D.   On: 02/10/2021 21:40   DG Chest Port 1 View  Result Date: 02/10/2021 CLINICAL DATA:  Found on floor. EXAM: PORTABLE CHEST 1 VIEW COMPARISON:  11/20/2020 FINDINGS: Heart and mediastinal contours are within normal limits. No focal opacities or effusions. No acute bony abnormality. IMPRESSION: No active disease. Electronically Signed   By: Rolm Baptise M.D.   On: 02/10/2021 21:39   DG Ankle Left Port  Result Date: 02/10/2021 CLINICAL DATA:  Found on floor EXAM: PORTABLE LEFT ANKLE - 2 VIEW COMPARISON:  None. FINDINGS: Bimalleolar fracture with oblique fracture through the distal fibular metaphysis and transverse fracture through the medial malleolus. Widening of the medial ankle mortise. No dislocation. Diffuse soft tissue swelling. IMPRESSION: Bimalleolar fracture with widening of the medial ankle mortise. Electronically Signed   By: Rolm Baptise M.D.   On: 02/10/2021 21:40    EKG: Independently reviewed.  EKG shows normal sinus rhythm with no acute ST elevation or depression.  QTc prolonged at 492  Assessment/Plan Principal Problem:   Alcohol withdrawal Ms. Carreon is admitted to progressive care with alcohol withdrawal. Pt has tremors and has had confusion. Had fall at home and laid on floor for hours today.  Placed on CIWA protocol and will receive Ativan for elevated CIWA scores. Librium 3 times daily She will be given multivitamin folic acid and thiamine daily IVF hydration with LR overnight.   Active Problems:   Essential hypertension Coreg bid. Monitor BP    Ankle fracture, left, closed, initial encounter Splint ordered by ER. Orthopedics consulted and will evaluate    L1 vertebral fracture   Orthopedic consulted.     Thyroid nodule Obtain ultrasound of thyroid with 1.9 cm right thyroid nodule.     Prolonged QT interval Avoid medications which could further prolong QT interval    DVT prophylaxis: Lovenox for DVT  prophylaxis  Code Status:   Full code Family Communication:  Diagnosis and plan discussed with patient.  She verbalized understanding agrees with plan.  Further recommendations as clinical indicated Disposition Plan:   Patient is from:  Home  Anticipated DC to:  Home  Anticipated DC date:  Anticipate 2 midnight or more stay in the hospital  Consults called:   Orthopedics Admission status:  Inpatient   Yevonne Aline Antavion Bartoszek MD [facility]  How to contact the [facility] Attending or Consulting provider [place] or covering provider during after hours [place], for this patient?   Check the care team in [facility] and look for a) attending/consulting [facility] provider listed and b) the [facility] team listed Log into [email or web address] and use [place]'s universal password to access. If you do not have the password, please contact the hospital operator. Locate the [facility] provider you are looking for under [facility] and page to a number that you can be directly reached. If you still have difficulty reaching the provider, please page the [facility] (Director on Call) for the Hospitalists listed on amion for assistance.  02/11/2021, 12:07 AM

## 2021-02-11 NOTE — Anesthesia Procedure Notes (Signed)
Anesthesia Regional Block: Popliteal block   Pre-Anesthetic Checklist: , timeout performed,  Correct Patient, Correct Site, Correct Laterality,  Correct Procedure, Correct Position, site marked,  Risks and benefits discussed,  Surgical consent,  Pre-op evaluation,  At surgeon's request and post-op pain management  Laterality: Left  Prep: chloraprep       Needles:  Injection technique: Single-shot  Needle Type: Echogenic Stimulator Needle     Needle Length: 9cm  Needle Gauge: 21     Additional Needles:   Procedures:,,,, ultrasound used (permanent image in chart),,    Narrative:  Start time: 02/11/2021 2:00 PM End time: 02/11/2021 2:05 PM Injection made incrementally with aspirations every 5 mL.  Performed by: Personally  Anesthesiologist: Effie Berkshire, MD  Additional Notes: Patient tolerated the procedure well. Local anesthetic introduced in an incremental fashion under minimal resistance after negative aspirations. No paresthesias were elicited. After completion of the procedure, no acute issues were identified and patient continued to be monitored by RN.

## 2021-02-11 NOTE — ED Notes (Addendum)
This RN attempted to administer oral medication to pt. Pt able to speak complete sentences and seal lips prior to medication administration, but noted to cough with swallowing and vomit medication, posing risk of aspiration. Pt currently stable and with no signs distress. Will notify provider of same.

## 2021-02-11 NOTE — TOC Initial Note (Signed)
Transition of Care [facility]) - Initial/Assessment Note    Patient Details  Name: Terri Ayala MRN: [id number] Date of Birth: [birth year]  Transition of Care [facility]) CM/SW Contact:    Verdell Carmine, RN Phone Number: 02/11/2021, 7:52 AM  Clinical Narrative:                  65 yo presented with fall, found on floor after family instituted a well check. . History of ETOH, is showing signs of WD in ED. [name] KitchenHad recent fall with ankle injury.was using walker when she tripped and fell, unknown time on floor. Was covered in urine and feces. Family does not live close. High fall risk. Home Health V SNF. PT consult placed CM will follow for needs, recommendations, and transitions  Expected Discharge Plan: [place] Barriers to Discharge: Continued Medical Work up   Patient Goals and CMS Choice        Expected Discharge Plan and Services Expected Discharge Plan: [place] In-house Referral: Clinical Social Work Discharge Planning Services: CM Consult   Living arrangements for the past 2 months: [place]                                      Prior Living Arrangements/Services Living arrangements for the past 2 months: [place] Lives with:: Self Patient language and need for interpreter reviewed:: Yes        Need for Family Participation in Patient Care: Yes (Comment) Care giver support system in place?:  (family does not live closeby)   Criminal Activity/Legal Involvement Pertinent to Current Situation/Hospitalization: No - Comment as needed  Activities of Daily Living      Permission Sought/Granted                  Emotional Assessment       Orientation: : Fluctuating Orientation (Suspected and/or reported Sundowners) Alcohol / Substance Use: Alcohol Use, Tobacco Use Psych Involvement: No (comment)  Admission diagnosis:  Alcohol withdrawal ([place]) [F10.939] Patient Active Problem List   Diagnosis Date  Noted   Ankle fracture, left, closed, initial encounter 02/10/2021   L1 vertebral fracture ([place]) 02/10/2021   Thyroid nodule 02/10/2021   Prolonged QT interval 02/10/2021   Elevated cholesterol 10/19/2020   Pedal edema 10/19/2020   History of colonic polyps 03/25/2020   Hemorrhage of rectum and anus 03/25/2020   Gastroparesis 03/25/2020   Gastroesophageal reflux disease 03/25/2020   Flatulence, eructation and gas pain 03/25/2020   Family history of malignant neoplasm of gastrointestinal tract 62/95/2841   Eosinophilic esophagitis 32/44/0102   Constipation 03/25/2020   Change in bowel habit 03/25/2020   Hospital discharge follow-up 72/53/6644   Metabolic acidosis 03/47/4259   Abnormal liver function 02/06/2019   Alcohol withdrawal delirium ([place]) 02/06/2019   Elevated LFTs 07/12/2018   Healthcare maintenance 06/26/2018   Rib fracture 01/04/2018   Chest pain 12/08/2017   Leukocytosis 12/08/2017   Essential hypertension 12/08/2017   Alcohol withdrawal ([place]) 07/21/2017   Lower urinary tract infectious disease 07/21/2017   Osteopenia 08/27/2016   Alcohol dependence with withdrawal ([place]) 01/27/2016   Generalized anxiety disorder 01/27/2016   Severe episode of recurrent major depressive disorder, without psychotic features ([place]) 01/27/2016   Major depressive disorder, recurrent episode, moderate (HCC)    Alcohol use disorder, severe, dependence ([place]) 04/03/2014   Lumbosacral radiculopathy at L5 05/20/2013  Nonspecific elevation of levels of transaminase or lactic acid dehydrogenase (LDH) 03/16/2013   Hepatic steatosis 03/16/2013   Nausea and vomiting 03/16/2013   Chronic cholecystitis 03/14/2013   Cervical arthritis 10/12/2012   Palpitations 11/29/2010   SVT (supraventricular tachycardia) ([place]) 11/29/2010   Mitral valve prolapse 11/29/2010   Decreased libido 11/15/2010   GOITER, MULTINODULAR 08/17/2009   ANXIETY 08/17/2009   Migraine headache 08/17/2009   Hearing loss  08/17/2009   Osteoporosis 08/17/2009   ASYMPTOMATIC POSTMENOPAUSAL STATUS 08/17/2009   DYSPHAGIA 06/19/2007   PCP:  Lorrene Reid, PA-C Pharmacy:   [facility], [address] [place] [address] Phone: [phone] Fax: [phone]     Social Determinants of Health ([place]) Interventions    Readmission Risk Interventions No flowsheet data found.

## 2021-02-11 NOTE — Anesthesia Procedure Notes (Signed)
Anesthesia Regional Block: Adductor canal block   Pre-Anesthetic Checklist: , timeout performed,  Correct Patient, Correct Site, Correct Laterality,  Correct Procedure, Correct Position, site marked,  Risks and benefits discussed,  Surgical consent,  Pre-op evaluation,  At surgeon's request and post-op pain management  Laterality: Left  Prep: chloraprep       Needles:  Injection technique: Single-shot  Needle Type: Echogenic Stimulator Needle     Needle Length: 9cm  Needle Gauge: 21     Additional Needles:   Procedures:,,,, ultrasound used (permanent image in chart),,    Narrative:  Start time: 02/11/2021 2:05 PM End time: 02/11/2021 2:10 PM Injection made incrementally with aspirations every 5 mL.  Performed by: Personally  Anesthesiologist: Effie Berkshire, MD  Additional Notes: Patient tolerated the procedure well. Local anesthetic introduced in an incremental fashion under minimal resistance after negative aspirations. No paresthesias were elicited. After completion of the procedure, no acute issues were identified and patient continued to be monitored by RN.

## 2021-02-11 NOTE — Anesthesia Procedure Notes (Signed)
Procedure Name: LMA Insertion Date/Time: 02/11/2021 2:38 PM Performed by: Babs Bertin, CRNA Pre-anesthesia Checklist: Patient identified, Emergency Drugs available, Suction available and Patient being monitored Patient Re-evaluated:Patient Re-evaluated prior to induction Oxygen Delivery Method: Circle System Utilized Preoxygenation: Pre-oxygenation with 100% oxygen Induction Type: IV induction Ventilation: Mask ventilation without difficulty LMA: LMA inserted LMA Size: 4.0 Number of attempts: 1 Airway Equipment and Method: Bite block Placement Confirmation: positive ETCO2 Tube secured with: Tape Dental Injury: Teeth and Oropharynx as per pre-operative assessment

## 2021-02-11 NOTE — Progress Notes (Signed)
Progress Note     Subjective: Comfortable appearing and in deep sleep initially at time of my exam. Arouses easily and oriented x3. States she stopped drinking alcohol last Wednesday as she is trying to quit. She has pain in her left ankle currently but tolerable. Allergy to codeine in chart - she states she has taken oxycodone in the past. No shortness of breath. No back pain. She denies nausea or emesis  Objective: Vital signs in last 24 hours: Temp:  [98.1 F (36.7 C)] 98.1 F (36.7 C) (12/14 2052) Pulse Rate:  [102-115] 113 (12/15 0700) Resp:  [17-24] 24 (12/15 0700) BP: (117-162)/(68-98) 140/78 (12/15 0700) SpO2:  [99 %-100 %] 100 % (12/15 0700)    Intake/Output from previous day: 12/14 0701 - 12/15 0700 In: 1000 [IV Piggyback:1000] Out: -  Intake/Output this shift: No intake/output data recorded.  PE: General: pleasant, WD, female who is laying in bed in NAD HEENT: head is normocephalic, atraumatic.  Sclera are noninjected.  Pupils equal and round. Ears and nose without any masses or lesions.  Mouth is pink and moist Heart: tachycardic. regular rhythm.  Normal s1,s2. No obvious murmurs, gallops, or rubs noted.  Palpable radial pulses bilaterally Lungs: CTAB, no wheezes, rhonchi, or rales noted.  Respiratory effort nonlabored Abd: soft, NT, ND, +BS, no masses, hernias, or organomegaly MSK: all 4 extremities are symmetrical with no cyanosis, clubbing, or edema. LLE in splint - toes WWP and mobile Skin: warm and dry with no masses, lesions, or rashes Neuro: Cranial nerves 2-12 grossly intact, sensation is normal throughout Psych: A&Ox3 with an appropriate affect.    Lab Results:  Recent Labs    02/10/21 2109 02/10/21 2218 02/11/21 0333  WBC 13.5*  --  13.4*  HGB 12.1 13.9 11.9*  HCT 36.6 41.0 36.1  PLT 123*  --  90*   BMET Recent Labs    02/10/21 2109 02/10/21 2218 02/11/21 0333  NA 136 138 139  K 3.6 3.8 4.5  CL 101 105 107  CO2 15*  --  17*  GLUCOSE  125* 123* 95  BUN 14 16 11   CREATININE 1.05* 0.60 0.95  CALCIUM 8.9  --  8.4*   PT/INR No results for input(s): LABPROT, INR in the last 72 hours. CMP     Component Value Date/Time   NA 139 02/11/2021 0333   NA 141 12/07/2020 1458   K 4.5 02/11/2021 0333   CL 107 02/11/2021 0333   CO2 17 (L) 02/11/2021 0333   GLUCOSE 95 02/11/2021 0333   BUN 11 02/11/2021 0333   BUN 11 12/07/2020 1458   CREATININE 0.95 02/11/2021 0333   CREATININE 0.39 (L) 04/22/2013 1416   CALCIUM 8.4 (L) 02/11/2021 0333   PROT 7.9 02/10/2021 2109   PROT 6.6 12/07/2020 1458   ALBUMIN 4.3 02/10/2021 2109   ALBUMIN 4.6 12/07/2020 1458   AST 50 (H) 02/10/2021 2109   ALT 19 02/10/2021 2109   ALKPHOS 64 02/10/2021 2109   BILITOT 1.9 (H) 02/10/2021 2109   BILITOT 0.5 12/07/2020 1458   GFRNONAA >60 02/11/2021 0333   GFRAA 86 03/27/2020 0851   Lipase     Component Value Date/Time   LIPASE 47 11/20/2020 2031       Studies/Results: CT HEAD WO CONTRAST (5MM)  Result Date: 02/10/2021 CLINICAL DATA:  Head trauma found lying on floor EXAM: CT HEAD WITHOUT CONTRAST CT CERVICAL SPINE WITHOUT CONTRAST TECHNIQUE: Multidetector CT imaging of the head and cervical spine was performed following the  standard protocol without intravenous contrast. Multiplanar CT image reconstructions of the cervical spine were also generated. COMPARISON:  CT brain and cervical spine 01/30/2018 FINDINGS: CT HEAD FINDINGS Brain: No acute territorial infarction, hemorrhage or intracranial mass. Stable left cerebellar calcification. Moderate atrophy. Mild chronic small vessel ischemic changes of the white matter. Stable ventricle size Vascular: No hyperdense vessels.  No unexpected calcification Skull: Normal. Negative for fracture or focal lesion. Sinuses/Orbits: No acute finding. Other: None CT CERVICAL SPINE FINDINGS Alignment: No subluxation.  Facet alignment within normal limits Skull base and vertebrae: No acute fracture. No primary bone  lesion or focal pathologic process. Soft tissues and spinal canal: No prevertebral fluid or swelling. No visible canal hematoma. Disc levels: Multilevel degenerative change. Moderate disc space narrowing C4-C5. Advanced degenerative changes C6-C7. Hypertrophic facet degenerative changes at multiple levels. Upper chest: Lung apices are clear. 1.9 cm hypodense nodule right lobe of thyroid and 17 mm hypodense nodule left lobe of thyroid. Other: None IMPRESSION: 1. No CT evidence for acute intracranial abnormality. Stable atrophy. 2. Degenerative changes of the cervical spine. No acute osseous abnormality 3. 1.9 cm hypodense nodule right lobe of thyroid. Recommend thyroid US (ref: J Am Coll Radiol. 2015 Feb;12(2): 143-50).This may be performed non emergently Electronically Signed   By: Donavan Foil M.D.   On: 02/10/2021 22:39   CT Cervical Spine Wo Contrast  Result Date: 02/10/2021 CLINICAL DATA:  Head trauma found lying on floor EXAM: CT HEAD WITHOUT CONTRAST CT CERVICAL SPINE WITHOUT CONTRAST TECHNIQUE: Multidetector CT imaging of the head and cervical spine was performed following the standard protocol without intravenous contrast. Multiplanar CT image reconstructions of the cervical spine were also generated. COMPARISON:  CT brain and cervical spine 01/30/2018 FINDINGS: CT HEAD FINDINGS Brain: No acute territorial infarction, hemorrhage or intracranial mass. Stable left cerebellar calcification. Moderate atrophy. Mild chronic small vessel ischemic changes of the white matter. Stable ventricle size Vascular: No hyperdense vessels.  No unexpected calcification Skull: Normal. Negative for fracture or focal lesion. Sinuses/Orbits: No acute finding. Other: None CT CERVICAL SPINE FINDINGS Alignment: No subluxation.  Facet alignment within normal limits Skull base and vertebrae: No acute fracture. No primary bone lesion or focal pathologic process. Soft tissues and spinal canal: No prevertebral fluid or swelling. No  visible canal hematoma. Disc levels: Multilevel degenerative change. Moderate disc space narrowing C4-C5. Advanced degenerative changes C6-C7. Hypertrophic facet degenerative changes at multiple levels. Upper chest: Lung apices are clear. 1.9 cm hypodense nodule right lobe of thyroid and 17 mm hypodense nodule left lobe of thyroid. Other: None IMPRESSION: 1. No CT evidence for acute intracranial abnormality. Stable atrophy. 2. Degenerative changes of the cervical spine. No acute osseous abnormality 3. 1.9 cm hypodense nodule right lobe of thyroid. Recommend thyroid US (ref: J Am Coll Radiol. 2015 Feb;12(2): 143-50).This may be performed non emergently Electronically Signed   By: Donavan Foil M.D.   On: 02/10/2021 22:39   CT Lumbar Spine Wo Contrast  Result Date: 02/10/2021 CLINICAL DATA:  Found on floor EXAM: CT LUMBAR SPINE WITHOUT CONTRAST TECHNIQUE: Multidetector CT imaging of the lumbar spine was performed without intravenous contrast administration. Multiplanar CT image reconstructions were also generated. COMPARISON:  CT 09/26/2020 FINDINGS: Segmentation: 5 lumbar type vertebrae. Alignment: Normal. Vertebrae: Vertebral body heights appear normal. Acute appearing left transverse process fracture at L1. Paraspinal and other soft tissues: No paravertebral or paraspinal soft tissue abnormality is evident. Aortic atherosclerosis. Acute mildly displaced left twelfth posteromedial rib fracture. Disc levels: At T12-L1, maintained disc  space. No canal stenosis. Foramen are patent. At L1-L2, maintained disc space. No canal stenosis. The foramen are patent. At L2-L3, mild disc space narrowing. No canal stenosis. The foramen are patent bilaterally. At L3-L4, maintained disc space. Left foraminal and lateral disc protrusion. No significant foraminal narrowing. No canal stenosis. Mild facet degenerative changes. At L4-L5, maintained disc space. No canal stenosis. Mild facet degenerative changes. The foramen are patent  bilaterally. At L5-S1, mild to moderate disc space narrowing. Posterior disc osteophyte complex. No canal stenosis. Facet degenerative changes. The foramen are patent bilaterally. IMPRESSION: 1. Acute appearing mildly displaced left transverse process fracture at L1. Adjacent acute mildly displaced left twelfth posteromedial rib fracture 2. Minimal degenerative changes. Electronically Signed   By: Donavan Foil M.D.   On: 02/10/2021 22:46   DG Pelvis Portable  Result Date: 02/10/2021 CLINICAL DATA:  Fall EXAM: PORTABLE PELVIS 1-2 VIEWS COMPARISON:  None. FINDINGS: Hip joints and SI joints are symmetric. No acute bony abnormality. Specifically, no fracture, subluxation, or dislocation. IMPRESSION: Negative. Electronically Signed   By: Rolm Baptise M.D.   On: 02/10/2021 21:40   DG Chest Port 1 View  Result Date: 02/10/2021 CLINICAL DATA:  Found on floor. EXAM: PORTABLE CHEST 1 VIEW COMPARISON:  11/20/2020 FINDINGS: Heart and mediastinal contours are within normal limits. No focal opacities or effusions. No acute bony abnormality. IMPRESSION: No active disease. Electronically Signed   By: Rolm Baptise M.D.   On: 02/10/2021 21:39   DG Ankle Left Port  Result Date: 02/10/2021 CLINICAL DATA:  Found on floor EXAM: PORTABLE LEFT ANKLE - 2 VIEW COMPARISON:  None. FINDINGS: Bimalleolar fracture with oblique fracture through the distal fibular metaphysis and transverse fracture through the medial malleolus. Widening of the medial ankle mortise. No dislocation. Diffuse soft tissue swelling. IMPRESSION: Bimalleolar fracture with widening of the medial ankle mortise. Electronically Signed   By: Rolm Baptise M.D.   On: 02/10/2021 21:40   US THYROID  Result Date: 02/11/2021 CLINICAL DATA:  Single thyroid nodule EXAM: THYROID ULTRASOUND TECHNIQUE: Ultrasound examination of the thyroid gland and adjacent soft tissues was performed. COMPARISON:  11/21/2012 FINDINGS: Parenchymal Echotexture: Mildly heterogeneous  Isthmus: 0.3 cm Right lobe: 3.9 x 2.4 x 1.6 cm Left lobe: 3.2 x 1.5 x 1.3 cm _________________________________________________________ Estimated total number of nodules >/= 1 cm: 2 Number of spongiform nodules >/=  2 cm not described below (TR1): 0 Number of mixed cystic and solid nodules >/= 1.5 cm not described below ([place]): 0 _________________________________________________________ Nodule # 1: Location: Right; inferior Maximum size: 1.9 cm; Other 2 dimensions: 1.6 x 1.6 cm Composition: solid/almost completely solid (2) Echogenicity: isoechoic (1) Shape: not taller-than-wide (0) Margins: smooth (0) Echogenic foci: none (0) ACR TI-RADS total points: 3. ACR TI-RADS risk category: TR3 (3 points). ACR TI-RADS recommendations: *Given size (>/= 1.5 - 2.4 cm) and appearance, a follow-up ultrasound in 1 year should be considered based on TI-RADS criteria. _________________________________________________________ Nodule # 2: Prior biopsy: No Location: Left; inferior Maximum size: 1.7 cm; Other 2 dimensions: 1.3 x 0.9 cm, previously, 1.3 x 1.1 x 1.1 cm Composition: solid/almost completely solid (2) Echogenicity: isoechoic (1) Shape: not taller-than-wide (0) Margins: smooth (0) Echogenic foci: none (0) ACR TI-RADS total points: 3. ACR TI-RADS risk category:  TR3 (3 points). Significant change in size (>/= 20% in two dimensions and minimal increase of 2 mm): No Change in features: No Change in ACR TI-RADS risk category: No ACR TI-RADS recommendations: Given stability since 2014, findings are consistent with a  benign etiology. _________________________________________________________ IMPRESSION: 1. Nodule 1 (TI-RADS 3) is new compared to prior thyroid ultrasound. It meets criteria for imaging follow-up. Annual ultrasound surveillance is recommended until 5 years of stability is documented. 2. Nodule 2 is not significantly changed in size since 2014 which indicates a benign etiology. The above is in keeping with the ACR TI-RADS  recommendations - J Am Coll Radiol 2017;14:587-595. Electronically Signed   By: Miachel Roux M.D.   On: 02/11/2021 07:43    Anti-infectives: Anti-infectives (From admission, onward)    None        Assessment/Plan Fall from standing   ETOH use - on CIWA per primary Bimalleolar left ankle fracture - per ortho, splint currently, ORIF today by Dr. Marcelino Scot L1 transverse process fracture - pain control Left 12th rib fracture - pulmonary toilet, IS, multimodal pain control regimen added   Trauma team will sign off but please contact us with any questions or concerns   LOS: 1 day    Winferd Humphrey, [facility] Surgery 02/11/2021, 8:32 AM Please see Amion for pager number during day hours 7:00am-4:30pm

## 2021-02-11 NOTE — Progress Notes (Signed)
Orthopedic Tech Progress Note Patient Details:  Terri Ayala [birth year] [id number]  Ortho Devices Type of Ortho Device: Post (short leg) splint, Stirrup splint Ortho Device/Splint Location: LLE Ortho Device/Splint Interventions: Ordered, Application, Adjustment   Post Interventions Patient Tolerated: Well Instructions Provided: Care of device, Poper ambulation with device  Nakia Remmers 02/11/2021, 12:33 AM

## 2021-02-11 NOTE — Progress Notes (Signed)
PROGRESS NOTE    Terri Ayala  RWE:[id number] DOB: 1955-05-07 DOA: 02/10/2021 PCP: Lorrene Reid, PA-C    Chief Complaint  Patient presents with   Altered Mental Status    Brief Narrative:   Terri Ayala is a 65 y.o. female with medical history significant for alcoholism, HTN, GERD, osteoporosis who presents by EMS after a fall at home.  She reports that she stopped drinking alcohol a week ago.  She has been drinking 2-3 bottles of wine a day she reports.  She states she has been having some confusion and more tremors than normal but she has been able to walk with the help of her walker.  She does report having pain in her left ankle after she twisted it and says "sprained it" last week.  Yesterday afternoon she fell at home and laid on the floor for a few hours.  Her mother tried to call her throughout the day and was unable to get in touch with her so called her neighbors who came and checked on her and found her on the floor.  She denies having any head trauma and did not have any loss of consciousness.  States she has not had any chest pain, palpitations nausea or vomiting.  She reports she has been admitted in the past for alcohol withdrawals.   ED Course: Ms. Daugherty has been hemodynamically stable in the emergency room.  She was found to have fracture of her left ankle as well as an L1 transverse process fracture.  Orthopedic surgeries been consulted and will be seeing patient.  Patient has had tremors and elevated CIWA score and Ativan was ordered in the emergency room.  Elevated CK level of 662 with a normal troponin of 10.  WBC 13,500 hemoglobin 12.1 hematocrit 36.6 platelets 223,000, sodium 136 potassium 3.6 chloride 101 bicarb 15 creatinine 1.05 BUN 14 glucose 125 alkaline phosphatase 64 AST 50 ALT 19 total bilirubin 1.9.  Hospitalist service asked to admit for further management.  Splint for left ankle fractures been ordered by the ER physician.      Assessment & Plan:    Principal Problem:   Alcohol withdrawal ([place]) Active Problems:   Essential hypertension   Ankle fracture, left, closed, initial encounter   L1 vertebral fracture ([facility])   Thyroid nodule   Prolonged QT interval   Alcohol abuse with withdrawals -With known history of alcohol abuse, continue with CIWA protocol. -Continue with scheduled Librium -Continue with thiamine and folic acid.  Rhabdomyolysis -Total CK elevated at 662 -Continue with IV fluids and avoid nephrotoxic medication, monitor total CK closely    Essential hypertension Coreg bid. Monitor BP   Ankle fracture, left, closed,  Splint ordered by ER. Orthopedics to operate today   Left 12th rib fracture  - pulmonary toilet, IS, multimodal pain control regimen added  L1 vertebral fracture  Continue with pain control     Thyroid nodule -Ultrasound is done, she will need annual ultrasound surveillance until 5 years of stability is documented   Prolonged QT interval Avoid medications which could further prolong QT interval call abuse with withdrawals   DVT prophylaxis:  Code Status: Full Family Communication: None at bedside Disposition:   Status is: Inpatient  Remains inpatient appropriate because: fracture, trauma, and Rhabdo.       Consultants:  Orthopedic. Trauma surgery   Subjective:  Patient herself denies any complaints, no significant events as discussed with staff  Objective: Vitals:   02/11/21 0700 02/11/21 0846 02/11/21 1000  02/11/21 1100  BP: 140/78 125/74 (!) 139/92 139/79  Pulse: (!) 113 (!) 120 (!) 112 (!) 113  Resp: (!) 24  20 15   Temp:    98 F (36.7 C)  TempSrc:    Oral  SpO2: 100%  100% 100%    Intake/Output Summary (Last 24 hours) at 02/11/2021 1154 Last data filed at 02/11/2021 0100 Gross per 24 hour  Intake 1000 ml  Output --  Net 1000 ml   There were no vitals filed for this visit.  Examination:  Awake, frail, mildly confused, no apparent distress, appears  older than stated age Symmetrical Chest wall movement, Good air movement bilaterally, CTAB RRR,No Gallops,Rubs or new Murmurs, No Parasternal Heave +ve B.Sounds, Abd Soft, No tenderness, No rebound - guarding or rigidity. No Cyanosis, left leg splint.    Data Reviewed: I have personally reviewed following labs and imaging studies  CBC: Recent Labs  Lab 02/10/21 2109 02/10/21 2218 02/11/21 0333  WBC 13.5*  --  13.4*  NEUTROABS 12.0*  --   --   HGB 12.1 13.9 11.9*  HCT 36.6 41.0 36.1  MCV 96.6  --  97.8  PLT 123*  --  90*    Basic Metabolic Panel: Recent Labs  Lab 02/10/21 2109 02/10/21 2218 02/11/21 0333  NA 136 138 139  K 3.6 3.8 4.5  CL 101 105 107  CO2 15*  --  17*  GLUCOSE 125* 123* 95  BUN 14 16 11   CREATININE 1.05* 0.60 0.95  CALCIUM 8.9  --  8.4*    GFR: CrCl cannot be calculated (Unknown ideal weight.).  Liver Function Tests: Recent Labs  Lab 02/10/21 2109  AST 50*  ALT 19  ALKPHOS 64  BILITOT 1.9*  PROT 7.9  ALBUMIN 4.3    CBG: No results for input(s): GLUCAP in the last 168 hours.   Recent Results (from the past 240 hour(s))  Resp Panel by RT-PCR (Flu A&B, Covid) Nasopharyngeal Swab     Status: None   Collection Time: 02/10/21 11:30 PM   Specimen: Nasopharyngeal Swab; Nasopharyngeal(NP) swabs in vial transport medium  Result Value Ref Range Status   SARS Coronavirus 2 by RT PCR NEGATIVE NEGATIVE Final    Comment: (NOTE) SARS-CoV-2 target nucleic acids are NOT DETECTED.  The SARS-CoV-2 RNA is generally detectable in upper respiratory specimens during the acute phase of infection. The lowest concentration of SARS-CoV-2 viral copies this assay can detect is 138 copies/mL. A negative result does not preclude SARS-Cov-2 infection and should not be used as the sole basis for treatment or other patient management decisions. A negative result may occur with  improper specimen collection/handling, submission of specimen other than  nasopharyngeal swab, presence of viral mutation(s) within the areas targeted by this assay, and inadequate number of viral copies(<138 copies/mL). A negative result must be combined with clinical observations, patient history, and epidemiological information. The expected result is Negative.  Fact Sheet for Patients:  EntrepreneurPulse.com.au  Fact Sheet for Healthcare Providers:  IncredibleEmployment.be  This test is no t yet approved or cleared by the [place] FDA and  has been authorized for detection and/or diagnosis of SARS-CoV-2 by FDA under an Emergency Use Authorization (EUA). This EUA will remain  in effect (meaning this test can be used) for the duration of the COVID-19 declaration under Section 564(b)(1) of the Act, 21 U.S.C.section 360bbb-3(b)(1), unless the authorization is terminated  or revoked sooner.       Influenza A by PCR NEGATIVE NEGATIVE Final  Influenza B by PCR NEGATIVE NEGATIVE Final    Comment: (NOTE) The Xpert Xpress SARS-CoV-2/FLU/RSV plus assay is intended as an aid in the diagnosis of influenza from Nasopharyngeal swab specimens and should not be used as a sole basis for treatment. Nasal washings and aspirates are unacceptable for Xpert Xpress SARS-CoV-2/FLU/RSV testing.  Fact Sheet for Patients: EntrepreneurPulse.com.au  Fact Sheet for Healthcare Providers: IncredibleEmployment.be  This test is not yet approved or cleared by the [place] FDA and has been authorized for detection and/or diagnosis of SARS-CoV-2 by FDA under an Emergency Use Authorization (EUA). This EUA will remain in effect (meaning this test can be used) for the duration of the COVID-19 declaration under Section 564(b)(1) of the Act, 21 U.S.C. section 360bbb-3(b)(1), unless the authorization is terminated or revoked.  Performed at [facility], [place] [address] [place], [place] [address]           Radiology Studies: CT HEAD WO CONTRAST (5MM)  Result Date: 02/10/2021 CLINICAL DATA:  Head trauma found lying on floor EXAM: CT HEAD WITHOUT CONTRAST CT CERVICAL SPINE WITHOUT CONTRAST TECHNIQUE: Multidetector CT imaging of the head and cervical spine was performed following the standard protocol without intravenous contrast. Multiplanar CT image reconstructions of the cervical spine were also generated. COMPARISON:  CT brain and cervical spine 01/30/2018 FINDINGS: CT HEAD FINDINGS Brain: No acute territorial infarction, hemorrhage or intracranial mass. Stable left cerebellar calcification. Moderate atrophy. Mild chronic small vessel ischemic changes of the white matter. Stable ventricle size Vascular: No hyperdense vessels.  No unexpected calcification Skull: Normal. Negative for fracture or focal lesion. Sinuses/Orbits: No acute finding. Other: None CT CERVICAL SPINE FINDINGS Alignment: No subluxation.  Facet alignment within normal limits Skull base and vertebrae: No acute fracture. No primary bone lesion or focal pathologic process. Soft tissues and spinal canal: No prevertebral fluid or swelling. No visible canal hematoma. Disc levels: Multilevel degenerative change. Moderate disc space narrowing C4-C5. Advanced degenerative changes C6-C7. Hypertrophic facet degenerative changes at multiple levels. Upper chest: Lung apices are clear. 1.9 cm hypodense nodule right lobe of thyroid and 17 mm hypodense nodule left lobe of thyroid. Other: None IMPRESSION: 1. No CT evidence for acute intracranial abnormality. Stable atrophy. 2. Degenerative changes of the cervical spine. No acute osseous abnormality 3. 1.9 cm hypodense nodule right lobe of thyroid. Recommend thyroid US (ref: J Am Coll Radiol. 2015 Feb;12(2): 143-50).This may be performed non emergently Electronically Signed   By: Donavan Foil M.D.   On: 02/10/2021 22:39   CT Cervical Spine Wo Contrast  Result Date: 02/10/2021 CLINICAL DATA:   Head trauma found lying on floor EXAM: CT HEAD WITHOUT CONTRAST CT CERVICAL SPINE WITHOUT CONTRAST TECHNIQUE: Multidetector CT imaging of the head and cervical spine was performed following the standard protocol without intravenous contrast. Multiplanar CT image reconstructions of the cervical spine were also generated. COMPARISON:  CT brain and cervical spine 01/30/2018 FINDINGS: CT HEAD FINDINGS Brain: No acute territorial infarction, hemorrhage or intracranial mass. Stable left cerebellar calcification. Moderate atrophy. Mild chronic small vessel ischemic changes of the white matter. Stable ventricle size Vascular: No hyperdense vessels.  No unexpected calcification Skull: Normal. Negative for fracture or focal lesion. Sinuses/Orbits: No acute finding. Other: None CT CERVICAL SPINE FINDINGS Alignment: No subluxation.  Facet alignment within normal limits Skull base and vertebrae: No acute fracture. No primary bone lesion or focal pathologic process. Soft tissues and spinal canal: No prevertebral fluid or swelling. No visible canal hematoma. Disc levels: Multilevel degenerative change.  Moderate disc space narrowing C4-C5. Advanced degenerative changes C6-C7. Hypertrophic facet degenerative changes at multiple levels. Upper chest: Lung apices are clear. 1.9 cm hypodense nodule right lobe of thyroid and 17 mm hypodense nodule left lobe of thyroid. Other: None IMPRESSION: 1. No CT evidence for acute intracranial abnormality. Stable atrophy. 2. Degenerative changes of the cervical spine. No acute osseous abnormality 3. 1.9 cm hypodense nodule right lobe of thyroid. Recommend thyroid US (ref: J Am Coll Radiol. 2015 Feb;12(2): 143-50).This may be performed non emergently Electronically Signed   By: Donavan Foil M.D.   On: 02/10/2021 22:39   CT Lumbar Spine Wo Contrast  Result Date: 02/10/2021 CLINICAL DATA:  Found on floor EXAM: CT LUMBAR SPINE WITHOUT CONTRAST TECHNIQUE: Multidetector CT imaging of the lumbar  spine was performed without intravenous contrast administration. Multiplanar CT image reconstructions were also generated. COMPARISON:  CT 09/26/2020 FINDINGS: Segmentation: 5 lumbar type vertebrae. Alignment: Normal. Vertebrae: Vertebral body heights appear normal. Acute appearing left transverse process fracture at L1. Paraspinal and other soft tissues: No paravertebral or paraspinal soft tissue abnormality is evident. Aortic atherosclerosis. Acute mildly displaced left twelfth posteromedial rib fracture. Disc levels: At T12-L1, maintained disc space. No canal stenosis. Foramen are patent. At L1-L2, maintained disc space. No canal stenosis. The foramen are patent. At L2-L3, mild disc space narrowing. No canal stenosis. The foramen are patent bilaterally. At L3-L4, maintained disc space. Left foraminal and lateral disc protrusion. No significant foraminal narrowing. No canal stenosis. Mild facet degenerative changes. At L4-L5, maintained disc space. No canal stenosis. Mild facet degenerative changes. The foramen are patent bilaterally. At L5-S1, mild to moderate disc space narrowing. Posterior disc osteophyte complex. No canal stenosis. Facet degenerative changes. The foramen are patent bilaterally. IMPRESSION: 1. Acute appearing mildly displaced left transverse process fracture at L1. Adjacent acute mildly displaced left twelfth posteromedial rib fracture 2. Minimal degenerative changes. Electronically Signed   By: Donavan Foil M.D.   On: 02/10/2021 22:46   DG Pelvis Portable  Result Date: 02/10/2021 CLINICAL DATA:  Fall EXAM: PORTABLE PELVIS 1-2 VIEWS COMPARISON:  None. FINDINGS: Hip joints and SI joints are symmetric. No acute bony abnormality. Specifically, no fracture, subluxation, or dislocation. IMPRESSION: Negative. Electronically Signed   By: Rolm Baptise M.D.   On: 02/10/2021 21:40   DG Chest Port 1 View  Result Date: 02/10/2021 CLINICAL DATA:  Found on floor. EXAM: PORTABLE CHEST 1 VIEW  COMPARISON:  11/20/2020 FINDINGS: Heart and mediastinal contours are within normal limits. No focal opacities or effusions. No acute bony abnormality. IMPRESSION: No active disease. Electronically Signed   By: Rolm Baptise M.D.   On: 02/10/2021 21:39   DG Ankle Left Port  Result Date: 02/10/2021 CLINICAL DATA:  Found on floor EXAM: PORTABLE LEFT ANKLE - 2 VIEW COMPARISON:  None. FINDINGS: Bimalleolar fracture with oblique fracture through the distal fibular metaphysis and transverse fracture through the medial malleolus. Widening of the medial ankle mortise. No dislocation. Diffuse soft tissue swelling. IMPRESSION: Bimalleolar fracture with widening of the medial ankle mortise. Electronically Signed   By: Rolm Baptise M.D.   On: 02/10/2021 21:40   US THYROID  Result Date: 02/11/2021 CLINICAL DATA:  Single thyroid nodule EXAM: THYROID ULTRASOUND TECHNIQUE: Ultrasound examination of the thyroid gland and adjacent soft tissues was performed. COMPARISON:  11/21/2012 FINDINGS: Parenchymal Echotexture: Mildly heterogeneous Isthmus: 0.3 cm Right lobe: 3.9 x 2.4 x 1.6 cm Left lobe: 3.2 x 1.5 x 1.3 cm _________________________________________________________ Estimated total number of nodules >/= 1 cm:  2 Number of spongiform nodules >/=  2 cm not described below (TR1): 0 Number of mixed cystic and solid nodules >/= 1.5 cm not described below ([place]): 0 _________________________________________________________ Nodule # 1: Location: Right; inferior Maximum size: 1.9 cm; Other 2 dimensions: 1.6 x 1.6 cm Composition: solid/almost completely solid (2) Echogenicity: isoechoic (1) Shape: not taller-than-wide (0) Margins: smooth (0) Echogenic foci: none (0) ACR TI-RADS total points: 3. ACR TI-RADS risk category: TR3 (3 points). ACR TI-RADS recommendations: *Given size (>/= 1.5 - 2.4 cm) and appearance, a follow-up ultrasound in 1 year should be considered based on TI-RADS criteria.  _________________________________________________________ Nodule # 2: Prior biopsy: No Location: Left; inferior Maximum size: 1.7 cm; Other 2 dimensions: 1.3 x 0.9 cm, previously, 1.3 x 1.1 x 1.1 cm Composition: solid/almost completely solid (2) Echogenicity: isoechoic (1) Shape: not taller-than-wide (0) Margins: smooth (0) Echogenic foci: none (0) ACR TI-RADS total points: 3. ACR TI-RADS risk category:  TR3 (3 points). Significant change in size (>/= 20% in two dimensions and minimal increase of 2 mm): No Change in features: No Change in ACR TI-RADS risk category: No ACR TI-RADS recommendations: Given stability since 2014, findings are consistent with a benign etiology. _________________________________________________________ IMPRESSION: 1. Nodule 1 (TI-RADS 3) is new compared to prior thyroid ultrasound. It meets criteria for imaging follow-up. Annual ultrasound surveillance is recommended until 5 years of stability is documented. 2. Nodule 2 is not significantly changed in size since 2014 which indicates a benign etiology. The above is in keeping with the ACR TI-RADS recommendations - J Am Coll Radiol 2017;14:587-595. Electronically Signed   By: Miachel Roux M.D.   On: 02/11/2021 07:43        Scheduled Meds:  [MAR Hold] acetaminophen  1,000 mg Oral Q6H   [MAR Hold] carvedilol  3.125 mg Oral BID WC   [MAR Hold] chlordiazePOXIDE  10 mg Oral TID   chlorhexidine  15 mL Mouth/Throat Once   Or   mouth rinse  15 mL Mouth Rinse Once   chlorhexidine       [MAR Hold] DULoxetine  60 mg Oral Daily   [MAR Hold] enoxaparin (LOVENOX) injection  40 mg Subcutaneous Daily   [MAR Hold] folic acid  1 mg Oral Daily   [MAR Hold] LORazepam  0-4 mg Intravenous Q4H   Followed by   [VZC Hold] LORazepam  0-4 mg Intravenous Q8H   [MAR Hold] multivitamin with minerals  1 tablet Oral Daily   [MAR Hold] thiamine  100 mg Oral Daily   Or   [MAR Hold] thiamine  100 mg Intravenous Daily   Continuous Infusions:  ceFAZolin      lactated ringers 100 mL/hr at 02/11/21 0406   lactated ringers     [MAR Hold] methocarbamol (ROBAXIN) IV       LOS: 1 day      Phillips Climes, MD [facility]   To contact the attending provider between 7A-7P or the covering provider during after hours 7P-7A, please log into the web site [email or web address] and access using universal [place] password for that web site. If you do not have the password, please call the hospital operator.  02/11/2021, 11:54 AM

## 2021-02-11 NOTE — Transfer of Care (Signed)
Immediate Anesthesia Transfer of Care Note  Patient: Terri Ayala  Procedure(s) Performed: OPEN REDUCTION INTERNAL FIXATION (ORIF) ANKLE FRACTURE (Left: Ankle)  Patient Location: PACU  Anesthesia Type:General and Regional  Level of Consciousness: responds to stimulation  Airway & Oxygen Therapy: Patient Spontanous Breathing  Post-op Assessment: Report given to RN and Post -op Vital signs reviewed and stable  Post vital signs: Reviewed and stable  Last Vitals:  Vitals Value Taken Time  BP 128/76 02/11/21 1732  Temp    Pulse 91 02/11/21 1733  Resp 18 02/11/21 1733  SpO2 100 % 02/11/21 1733  Vitals shown include unvalidated device data.  Last Pain:  Vitals:   02/11/21 1157  TempSrc: Oral  PainSc: 5          Complications: No notable events documented.

## 2021-02-11 NOTE — Anesthesia Postprocedure Evaluation (Signed)
Anesthesia Post Note  Patient: Terri Ayala  Procedure(s) Performed: OPEN REDUCTION INTERNAL FIXATION (ORIF) ANKLE FRACTURE (Left: Ankle)     Patient location during evaluation: PACU Anesthesia Type: General Level of consciousness: awake and alert Pain management: pain level controlled Vital Signs Assessment: post-procedure vital signs reviewed and stable Respiratory status: spontaneous breathing, nonlabored ventilation, respiratory function stable and patient connected to nasal cannula oxygen Cardiovascular status: blood pressure returned to baseline and stable Postop Assessment: no apparent nausea or vomiting Anesthetic complications: no   No notable events documented.  Last Vitals:  Vitals:   02/11/21 1747 02/11/21 1802  BP: 120/70 125/70  Pulse: 92 95  Resp: 20 18  Temp:    SpO2: 100% 100%    Last Pain:  Vitals:   02/11/21 1802  TempSrc:   PainSc: Asleep                 Effie Berkshire

## 2021-02-12 LAB — BASIC METABOLIC PANEL
Anion gap: 13 (ref 5–15)
BUN: 11 mg/dL (ref 8–23)
CO2: 18 mmol/L — ABNORMAL LOW (ref 22–32)
Calcium: 7.9 mg/dL — ABNORMAL LOW (ref 8.9–10.3)
Chloride: 106 mmol/L (ref 98–111)
Creatinine, Ser: 0.81 mg/dL (ref 0.44–1.00)
GFR, Estimated: >60 mL/min (ref >60)
Glucose, Bld: 123 mg/dL — ABNORMAL HIGH (ref 70–99)
Potassium: 3.9 mmol/L (ref 3.5–5.1)
Sodium: 137 mmol/L (ref 135–145)

## 2021-02-12 LAB — CBC
HCT: 29.8 % — ABNORMAL LOW (ref 36.0–46.0)
Hemoglobin: 10.1 g/dL — ABNORMAL LOW (ref 12.0–15.0)
MCH: 32.4 pg (ref 26.0–34.0)
MCHC: 33.9 g/dL (ref 30.0–36.0)
MCV: 95.5 fL (ref 80.0–100.0)
Platelets: 96 10*3/uL — ABNORMAL LOW (ref 150–400)
RBC: 3.12 MIL/uL — ABNORMAL LOW (ref 3.87–5.11)
RDW: 13.1 % (ref 11.5–15.5)
WBC: 7.9 10*3/uL (ref 4.0–10.5)
nRBC: 0 % (ref 0.0–0.2)

## 2021-02-12 LAB — MAGNESIUM: Magnesium: 1.8 mg/dL (ref 1.7–2.4)

## 2021-02-12 LAB — PHOSPHORUS: Phosphorus: 2.1 mg/dL — ABNORMAL LOW (ref 2.5–4.6)

## 2021-02-12 LAB — VITAMIN D 25 HYDROXY (VIT D DEFICIENCY, FRACTURES): Vit D, 25-Hydroxy: 42.2 ng/mL (ref 30–100)

## 2021-02-12 MED ORDER — SODIUM PHOSPHATES 45 MMOLE/15ML IV SOLN
30.0000 mmol | Freq: Once | INTRAVENOUS | Status: AC
  Administered 2021-02-12: 30 mmol via INTRAVENOUS
  Filled 2021-02-12: qty 10

## 2021-02-12 NOTE — Discharge Instructions (Signed)
[facility] Discharge Instructions   General Discharge Instructions  Orthopaedic Injuries:  Left ankle fracture treated with open reduction internal fixation using plate and screws  WEIGHT BEARING STATUS: Nonweightbearing left leg for 6 to 8 weeks.  RANGE OF MOTION/ACTIVITY: Unrestricted motion of toes and knee.  Do not remove splint.  Activity as tolerated while maintaining weightbearing restrictions  Bone health: Your fracture pattern is suggestive of fragility fracture which is linked to osteoporosis.  You will need a bone density scan in the next 8 weeks or so.  Vitamin D levels do look appropriate  Review the following resource for additional information regarding bone health  [email or web address]  Wound Care: Keep splint clean and dry.  Do not remove splint until follow-up visit.  DVT/PE prophylaxis: xarelto 10 mg daily x 21 days   Diet: as you were eating previously.  Can use over the counter stool softeners and bowel preparations, such as Miralax, to help with bowel movements.  Narcotics can be constipating.  Be sure to drink plenty of fluids  PAIN MEDICATION USE AND EXPECTATIONS  You have likely been given narcotic medications to help control your pain.  After a traumatic event that results in an fracture (broken bone) with or without surgery, it is ok to use narcotic pain medications to help control one's pain.  We understand that everyone responds to pain differently and each individual patient will be evaluated on a regular basis for the continued need for narcotic medications. Ideally, narcotic medication use should last no more than 6-8 weeks (coinciding with fracture healing).   As a patient it is your responsibility as well to monitor narcotic medication use and report the amount and frequency you use these medications when you come to your office visit.   We would also advise that if you are using narcotic medications, you should  take a dose prior to therapy to maximize you participation.  IF YOU ARE ON NARCOTIC MEDICATIONS IT IS NOT PERMISSIBLE TO OPERATE A MOTOR VEHICLE (MOTORCYCLE/CAR/TRUCK/MOPED) OR HEAVY MACHINERY DO NOT MIX NARCOTICS WITH OTHER CNS (CENTRAL NERVOUS SYSTEM) DEPRESSANTS SUCH AS ALCOHOL   POST-OPERATIVE OPIOID TAPER INSTRUCTIONS: It is important to wean off of your opioid medication as soon as possible. If you do not need pain medication after your surgery it is ok to stop day one. Opioids include: Codeine, Hydrocodone(Norco, Vicodin), Oxycodone(Percocet, oxycontin) and hydromorphone amongst others.  Long term and even short term use of opiods can cause: Increased pain response Dependence Constipation Depression Respiratory depression And more.  Withdrawal symptoms can include Flu like symptoms Nausea, vomiting And more Techniques to manage these symptoms Hydrate well Eat regular healthy meals Stay active Use relaxation techniques(deep breathing, meditating, yoga) Do Not substitute Alcohol to help with tapering If you have been on opioids for less than two weeks and do not have pain than it is ok to stop all together.  Plan to wean off of opioids This plan should start within one week post op of your fracture surgery  Maintain the same interval or time between taking each dose and first decrease the dose.  Cut the total daily intake of opioids by one tablet each day Next start to increase the time between doses. The last dose that should be eliminated is the evening dose.    STOP SMOKING OR USING NICOTINE PRODUCTS!!!!  As discussed nicotine severely impairs your body's ability to heal surgical and traumatic wounds but also impairs bone healing.  Wounds and bone heal by forming  microscopic blood vessels (angiogenesis) and nicotine is a vasoconstrictor (essentially, shrinks blood vessels).  Therefore, if vasoconstriction occurs to these microscopic blood vessels they essentially  disappear and are unable to deliver necessary nutrients to the healing tissue.  This is one modifiable factor that you can do to dramatically increase your chances of healing your injury.    (This means no smoking, no nicotine gum, patches, etc)  DO NOT USE NONSTEROIDAL ANTI-INFLAMMATORY DRUGS (NSAID'S)  Using products such as Advil (ibuprofen), Aleve (naproxen), Motrin (ibuprofen) for additional pain control during fracture healing can delay and/or prevent the healing response.  If you would like to take over the counter (OTC) medication, Tylenol (acetaminophen) is ok.  However, some narcotic medications that are given for pain control contain acetaminophen as well. Therefore, you should not exceed more than 4000 mg of tylenol in a day if you do not have liver disease.  Also note that there are may OTC medicines, such as cold medicines and allergy medicines that my contain tylenol as well.  If you have any questions about medications and/or interactions please ask your doctor/PA or your pharmacist.      ICE AND ELEVATE INJURED/OPERATIVE EXTREMITY  Using ice and elevating the injured extremity above your heart can help with swelling and pain control.  Icing in a pulsatile fashion, such as 20 minutes on and 20 minutes off, can be followed.    Do not place ice directly on skin. Make sure there is a barrier between to skin and the ice pack.    Using frozen items such as frozen peas works well as the conform nicely to the are that needs to be iced.  USE AN ACE WRAP OR TED HOSE FOR SWELLING CONTROL  In addition to icing and elevation, Ace wraps or TED hose are used to help limit and resolve swelling.  It is recommended to use Ace wraps or TED hose until you are informed to stop.    When using Ace Wraps start the wrapping distally (farthest away from the body) and wrap proximally (closer to the body)   Example: If you had surgery on your leg or thing and you do not have a splint on, start the ace wrap at  the toes and work your way up to the thigh        If you had surgery on your upper extremity and do not have a splint on, start the ace wrap at your fingers and work your way up to the upper arm  IF YOU ARE IN A SPLINT OR CAST DO NOT [place]   If your splint gets wet for any reason please contact the office immediately. You may shower in your splint or cast as long as you keep it dry.  This can be done by wrapping in a cast cover or garbage back (or similar)  Do Not stick any thing down your splint or cast such as pencils, money, or hangers to try and scratch yourself with.  If you feel itchy take benadryl as prescribed on the bottle for itching  IF YOU ARE IN A CAM BOOT (BLACK BOOT)  You may remove boot periodically. Perform daily dressing changes as noted below.  Wash the liner of the boot regularly and wear a sock when wearing the boot. It is recommended that you sleep in the boot until told otherwise    Call office for the following: Temperature greater than 101F Persistent nausea and vomiting Severe uncontrolled pain  Redness, tenderness, or signs of infection (pain, swelling, redness, odor or green/yellow discharge around the site) Difficulty breathing, headache or visual disturbances Hives Persistent dizziness or light-headedness Extreme fatigue Any other questions or concerns you may have after discharge  In an emergency, call 911 or go to an Emergency Department at a nearby hospital  HELPFUL INFORMATION  If you had a block, it will wear off between 8-24 hrs postop typically.  This is period when your pain may go from nearly zero to the pain you would have had postop without the block.  This is an abrupt transition but nothing dangerous is happening.  You may take an extra dose of narcotic when this happens.  You should wean off your narcotic medicines as soon as you are able.  Most patients will be off or using minimal narcotics before their first postop  appointment.   We suggest you use the pain medication the first night prior to going to bed, in order to ease any pain when the anesthesia wears off. You should avoid taking pain medications on an empty stomach as it will make you nauseous.  Do not drink alcoholic beverages or take illicit drugs when taking pain medications.  In most states it is against the law to drive while you are in a splint or sling.  And certainly against the law to drive while taking narcotics.  You may return to work/school in the next couple of days when you feel up to it.   Pain medication may make you constipated.  Below are a few solutions to try in this order: Decrease the amount of pain medication if you arent having pain. Drink lots of decaffeinated fluids. Drink prune juice and/or each dried prunes  If the first 3 dont work start with additional solutions Take Colace - an over-the-counter stool softener Take Senokot - an over-the-counter laxative Take Miralax - a stronger over-the-counter laxative     CALL THE OFFICE WITH ANY QUESTIONS OR CONCERNS: [phone]   VISIT OUR WEBSITE FOR ADDITIONAL INFORMATION: [email or web address]

## 2021-02-12 NOTE — Progress Notes (Signed)
PROGRESS NOTE    DIANNE WHELCHEL  LEX:[id number] DOB: [birth year] DOA: 02/10/2021 PCP: Lorrene Reid, PA-C    Chief Complaint  Patient presents with   Altered Mental Status    Brief Narrative:   Terri Ayala is a 65 y.o. female with medical history significant for alcoholism, HTN, GERD, osteoporosis who presents by EMS after a fall at home.  She reports that she stopped drinking alcohol a week ago.  She has been drinking 2-3 bottles of wine a day she reports.  She states she has been having some confusion and more tremors than normal but she has been able to walk with the help of her walker.  She does report having pain in her left ankle after she twisted it and says "sprained it" last week.  Yesterday afternoon she fell at home and laid on the floor for a few hours.  Her mother tried to call her throughout the day and was unable to get in touch with her so called her neighbors who came and checked on her and found her on the floor.  She denies having any head trauma and did not have any loss of consciousness.  States she has not had any chest pain, palpitations nausea or vomiting.  She reports she has been admitted in the past for alcohol withdrawals. -Her work-up significant for left ankle fracture, left 12th rib fracture, and L1 vertebral fracture, alcohol abuse with withdrawal, and rhabdomyolysis, she was admitted for further management   Assessment & Plan:   Principal Problem:   Alcohol withdrawal ([place]) Active Problems:   Essential hypertension   Ankle fracture, left, closed, initial encounter   L1 vertebral fracture ([facility])   Thyroid nodule   Prolonged QT interval   Alcohol abuse with withdrawals -With known history of alcohol abuse, continue with CIWA protocol. -Continue with scheduled Librium -Continue with thiamine and folic acid. -She was counseled today  Rhabdomyolysis -Total CK elevated at 662, recheck in a.m. -Continue with IV fluids and avoid nephrotoxic medication,     Essential hypertension -Blood pressure is acceptable, continue with current dose Coreg.   Ankle fracture, left, closed,  -Status post ORIF by orthopedic.   Left 12th rib fracture  - pulmonary toilet, IS, multimodal pain control regimen added, she was encouraged with incentive spirometer today  L1 vertebral fracture  Continue with pain control     Thyroid nodule -Ultrasound is done, she will need annual ultrasound surveillance until 5 years of stability is documented   Prolonged QT interval Avoid medications which could further prolong QT interval call abuse with withdrawals  Hypophosphatemia -Repleted, monitor closely  DVT prophylaxis:  Code Status: Full Family Communication: None at bedside Disposition: Recommendation for SNF placement  Status is: Inpatient  Remains inpatient appropriate because: fracture, trauma, and Rhabdo.       Consultants:  Orthopedic. Trauma surgery   Subjective:  Patient reports she is feeling better today, pain is controlled  Objective: Vitals:   02/12/21 0034 02/12/21 0344 02/12/21 0807 02/12/21 1152  BP:  140/77 128/75 135/78  Pulse:  97 100 (!) 104  Resp: 19 20 19 20   Temp:  98.7 F (37.1 C) 98.5 F (36.9 C)   TempSrc:  Oral Oral   SpO2:  100% 99% 98%    Intake/Output Summary (Last 24 hours) at 02/12/2021 1524 Last data filed at 02/12/2021 1100 Gross per 24 hour  Intake 1783.99 ml  Output 300 ml  Net 1483.99 ml   There were no vitals filed  for this visit.  Examination:  Awake Alert, Oriented X 3, frail Symmetrical Chest wall movement, Good air movement bilaterally, CTAB RRR,No Gallops,Rubs or new Murmurs, No Parasternal Heave +ve B.Sounds, Abd Soft, No tenderness, No rebound - guarding or rigidity. No Cyanosis, left leg in a splint    Data Reviewed: I have personally reviewed following labs and imaging studies  CBC: Recent Labs  Lab 02/10/21 2109 02/10/21 2218 02/11/21 0333 02/12/21 0106  WBC 13.5*  --   13.4* 7.9  NEUTROABS 12.0*  --   --   --   HGB 12.1 13.9 11.9* 10.1*  HCT 36.6 41.0 36.1 29.8*  MCV 96.6  --  97.8 95.5  PLT 123*  --  90* 96*    Basic Metabolic Panel: Recent Labs  Lab 02/10/21 2109 02/10/21 2218 02/11/21 0333 02/12/21 0106  NA 136 138 139 137  K 3.6 3.8 4.5 3.9  CL 101 105 107 106  CO2 15*  --  17* 18*  GLUCOSE 125* 123* 95 123*  BUN 14 16 11 11   CREATININE 1.05* 0.60 0.95 0.81  CALCIUM 8.9  --  8.4* 7.9*  MG  --   --   --  1.8  PHOS  --   --   --  2.1*    GFR: CrCl cannot be calculated (Unknown ideal weight.).  Liver Function Tests: Recent Labs  Lab 02/10/21 2109  AST 50*  ALT 19  ALKPHOS 64  BILITOT 1.9*  PROT 7.9  ALBUMIN 4.3    CBG: No results for input(s): GLUCAP in the last 168 hours.   Recent Results (from the past 240 hour(s))  Resp Panel by RT-PCR (Flu A&B, Covid) Nasopharyngeal Swab     Status: None   Collection Time: 02/10/21 11:30 PM   Specimen: Nasopharyngeal Swab; Nasopharyngeal(NP) swabs in vial transport medium  Result Value Ref Range Status   SARS Coronavirus 2 by RT PCR NEGATIVE NEGATIVE Final    Comment: (NOTE) SARS-CoV-2 target nucleic acids are NOT DETECTED.  The SARS-CoV-2 RNA is generally detectable in upper respiratory specimens during the acute phase of infection. The lowest concentration of SARS-CoV-2 viral copies this assay can detect is 138 copies/mL. A negative result does not preclude SARS-Cov-2 infection and should not be used as the sole basis for treatment or other patient management decisions. A negative result may occur with  improper specimen collection/handling, submission of specimen other than nasopharyngeal swab, presence of viral mutation(s) within the areas targeted by this assay, and inadequate number of viral copies(<138 copies/mL). A negative result must be combined with clinical observations, patient history, and epidemiological information. The expected result is Negative.  Fact  Sheet for Patients:  EntrepreneurPulse.com.au  Fact Sheet for Healthcare Providers:  IncredibleEmployment.be  This test is no t yet approved or cleared by the [place] FDA and  has been authorized for detection and/or diagnosis of SARS-CoV-2 by FDA under an Emergency Use Authorization (EUA). This EUA will remain  in effect (meaning this test can be used) for the duration of the COVID-19 declaration under Section 564(b)(1) of the Act, 21 U.S.C.section 360bbb-3(b)(1), unless the authorization is terminated  or revoked sooner.       Influenza A by PCR NEGATIVE NEGATIVE Final   Influenza B by PCR NEGATIVE NEGATIVE Final    Comment: (NOTE) The Xpert Xpress SARS-CoV-2/FLU/RSV plus assay is intended as an aid in the diagnosis of influenza from Nasopharyngeal swab specimens and should not be used as a sole basis for treatment. Nasal  washings and aspirates are unacceptable for Xpert Xpress SARS-CoV-2/FLU/RSV testing.  Fact Sheet for Patients: EntrepreneurPulse.com.au  Fact Sheet for Healthcare Providers: IncredibleEmployment.be  This test is not yet approved or cleared by the [place] FDA and has been authorized for detection and/or diagnosis of SARS-CoV-2 by FDA under an Emergency Use Authorization (EUA). This EUA will remain in effect (meaning this test can be used) for the duration of the COVID-19 declaration under Section 564(b)(1) of the Act, 21 U.S.C. section 360bbb-3(b)(1), unless the authorization is terminated or revoked.  Performed at [facility], [place] [address] [place], [place] [address]   Surgical pcr screen     Status: None   Collection Time: 02/11/21 12:00 PM   Specimen: Nasal Mucosa; Nasal Swab  Result Value Ref Range Status   MRSA, PCR NEGATIVE NEGATIVE Final   Staphylococcus aureus NEGATIVE NEGATIVE Final    Comment: (NOTE) The Xpert SA Assay (FDA approved for NASAL  specimens in patients 52 years of age and older), is one component of a comprehensive surveillance program. It is not intended to diagnose infection nor to guide or monitor treatment. Performed at [facility], [place] [address], [place], [place] [address]          Radiology Studies: DG Ankle Complete Left  Result Date: 02/11/2021 CLINICAL DATA:  Postop ankle fracture EXAM: LEFT ANKLE COMPLETE - 3+ VIEW COMPARISON:  02/10/2021 FINDINGS: Interval surgical plate and multiple screw fixation of the left fibula for distal fibular fracture. Status post surgical fixation of medial malleolar fracture. There is anatomic alignment. Reduction of previously noted lateral talar subluxation. IMPRESSION: Interval internal fixation of bimalleolar fractures with anatomic alignment. Electronically Signed   By: Donavan Foil M.D.   On: 02/11/2021 19:52   DG Ankle Complete Left  Result Date: 02/11/2021 CLINICAL DATA:  Surgical internal fixation of left ankle fracture. EXAM: LEFT ANKLE COMPLETE - 3+ VIEW; DG C-ARM 1-60 MIN-NO REPORT Radiation exposure index: 0.58 mGy. COMPARISON:  February 10, 2021. FINDINGS: Five intraoperative fluoroscopic images were obtained of the left ankle. These demonstrate the patient be status post surgical internal fixation of distal left fibular and tibial fractures. Good alignment of fracture components is noted. IMPRESSION: Fluoroscopic guidance provided during surgical internal fixation of distal left tibial and fibular fractures. Electronically Signed   By: Marijo Conception M.D.   On: 02/11/2021 17:15   CT HEAD WO CONTRAST (5MM)  Result Date: 02/10/2021 CLINICAL DATA:  Head trauma found lying on floor EXAM: CT HEAD WITHOUT CONTRAST CT CERVICAL SPINE WITHOUT CONTRAST TECHNIQUE: Multidetector CT imaging of the head and cervical spine was performed following the standard protocol without intravenous contrast. Multiplanar CT image reconstructions of the cervical spine were also  generated. COMPARISON:  CT brain and cervical spine 01/30/2018 FINDINGS: CT HEAD FINDINGS Brain: No acute territorial infarction, hemorrhage or intracranial mass. Stable left cerebellar calcification. Moderate atrophy. Mild chronic small vessel ischemic changes of the white matter. Stable ventricle size Vascular: No hyperdense vessels.  No unexpected calcification Skull: Normal. Negative for fracture or focal lesion. Sinuses/Orbits: No acute finding. Other: None CT CERVICAL SPINE FINDINGS Alignment: No subluxation.  Facet alignment within normal limits Skull base and vertebrae: No acute fracture. No primary bone lesion or focal pathologic process. Soft tissues and spinal canal: No prevertebral fluid or swelling. No visible canal hematoma. Disc levels: Multilevel degenerative change. Moderate disc space narrowing C4-C5. Advanced degenerative changes C6-C7. Hypertrophic facet degenerative changes at multiple levels. Upper chest: Lung apices are clear.  1.9 cm hypodense nodule right lobe of thyroid and 17 mm hypodense nodule left lobe of thyroid. Other: None IMPRESSION: 1. No CT evidence for acute intracranial abnormality. Stable atrophy. 2. Degenerative changes of the cervical spine. No acute osseous abnormality 3. 1.9 cm hypodense nodule right lobe of thyroid. Recommend thyroid US (ref: J Am Coll Radiol. 2015 Feb;12(2): 143-50).This may be performed non emergently Electronically Signed   By: Donavan Foil M.D.   On: 02/10/2021 22:39   CT Cervical Spine Wo Contrast  Result Date: 02/10/2021 CLINICAL DATA:  Head trauma found lying on floor EXAM: CT HEAD WITHOUT CONTRAST CT CERVICAL SPINE WITHOUT CONTRAST TECHNIQUE: Multidetector CT imaging of the head and cervical spine was performed following the standard protocol without intravenous contrast. Multiplanar CT image reconstructions of the cervical spine were also generated. COMPARISON:  CT brain and cervical spine 01/30/2018 FINDINGS: CT HEAD FINDINGS Brain: No acute  territorial infarction, hemorrhage or intracranial mass. Stable left cerebellar calcification. Moderate atrophy. Mild chronic small vessel ischemic changes of the white matter. Stable ventricle size Vascular: No hyperdense vessels.  No unexpected calcification Skull: Normal. Negative for fracture or focal lesion. Sinuses/Orbits: No acute finding. Other: None CT CERVICAL SPINE FINDINGS Alignment: No subluxation.  Facet alignment within normal limits Skull base and vertebrae: No acute fracture. No primary bone lesion or focal pathologic process. Soft tissues and spinal canal: No prevertebral fluid or swelling. No visible canal hematoma. Disc levels: Multilevel degenerative change. Moderate disc space narrowing C4-C5. Advanced degenerative changes C6-C7. Hypertrophic facet degenerative changes at multiple levels. Upper chest: Lung apices are clear. 1.9 cm hypodense nodule right lobe of thyroid and 17 mm hypodense nodule left lobe of thyroid. Other: None IMPRESSION: 1. No CT evidence for acute intracranial abnormality. Stable atrophy. 2. Degenerative changes of the cervical spine. No acute osseous abnormality 3. 1.9 cm hypodense nodule right lobe of thyroid. Recommend thyroid US (ref: J Am Coll Radiol. 2015 Feb;12(2): 143-50).This may be performed non emergently Electronically Signed   By: Donavan Foil M.D.   On: 02/10/2021 22:39   CT Lumbar Spine Wo Contrast  Result Date: 02/10/2021 CLINICAL DATA:  Found on floor EXAM: CT LUMBAR SPINE WITHOUT CONTRAST TECHNIQUE: Multidetector CT imaging of the lumbar spine was performed without intravenous contrast administration. Multiplanar CT image reconstructions were also generated. COMPARISON:  CT 09/26/2020 FINDINGS: Segmentation: 5 lumbar type vertebrae. Alignment: Normal. Vertebrae: Vertebral body heights appear normal. Acute appearing left transverse process fracture at L1. Paraspinal and other soft tissues: No paravertebral or paraspinal soft tissue abnormality is  evident. Aortic atherosclerosis. Acute mildly displaced left twelfth posteromedial rib fracture. Disc levels: At T12-L1, maintained disc space. No canal stenosis. Foramen are patent. At L1-L2, maintained disc space. No canal stenosis. The foramen are patent. At L2-L3, mild disc space narrowing. No canal stenosis. The foramen are patent bilaterally. At L3-L4, maintained disc space. Left foraminal and lateral disc protrusion. No significant foraminal narrowing. No canal stenosis. Mild facet degenerative changes. At L4-L5, maintained disc space. No canal stenosis. Mild facet degenerative changes. The foramen are patent bilaterally. At L5-S1, mild to moderate disc space narrowing. Posterior disc osteophyte complex. No canal stenosis. Facet degenerative changes. The foramen are patent bilaterally. IMPRESSION: 1. Acute appearing mildly displaced left transverse process fracture at L1. Adjacent acute mildly displaced left twelfth posteromedial rib fracture 2. Minimal degenerative changes. Electronically Signed   By: Donavan Foil M.D.   On: 02/10/2021 22:46   DG Pelvis Portable  Result Date: 02/10/2021 CLINICAL DATA:  Fall  EXAM: PORTABLE PELVIS 1-2 VIEWS COMPARISON:  None. FINDINGS: Hip joints and SI joints are symmetric. No acute bony abnormality. Specifically, no fracture, subluxation, or dislocation. IMPRESSION: Negative. Electronically Signed   By: Rolm Baptise M.D.   On: 02/10/2021 21:40   DG Chest Port 1 View  Result Date: 02/10/2021 CLINICAL DATA:  Found on floor. EXAM: PORTABLE CHEST 1 VIEW COMPARISON:  11/20/2020 FINDINGS: Heart and mediastinal contours are within normal limits. No focal opacities or effusions. No acute bony abnormality. IMPRESSION: No active disease. Electronically Signed   By: Rolm Baptise M.D.   On: 02/10/2021 21:39   DG Ankle Left Port  Result Date: 02/10/2021 CLINICAL DATA:  Found on floor EXAM: PORTABLE LEFT ANKLE - 2 VIEW COMPARISON:  None. FINDINGS: Bimalleolar fracture with  oblique fracture through the distal fibular metaphysis and transverse fracture through the medial malleolus. Widening of the medial ankle mortise. No dislocation. Diffuse soft tissue swelling. IMPRESSION: Bimalleolar fracture with widening of the medial ankle mortise. Electronically Signed   By: Rolm Baptise M.D.   On: 02/10/2021 21:40   DG C-Arm 1-60 Min-No Report  Result Date: 02/11/2021 CLINICAL DATA:  Surgical internal fixation of left ankle fracture. EXAM: LEFT ANKLE COMPLETE - 3+ VIEW; DG C-ARM 1-60 MIN-NO REPORT Radiation exposure index: 0.58 mGy. COMPARISON:  February 10, 2021. FINDINGS: Five intraoperative fluoroscopic images were obtained of the left ankle. These demonstrate the patient be status post surgical internal fixation of distal left fibular and tibial fractures. Good alignment of fracture components is noted. IMPRESSION: Fluoroscopic guidance provided during surgical internal fixation of distal left tibial and fibular fractures. Electronically Signed   By: Marijo Conception M.D.   On: 02/11/2021 17:15   DG C-Arm 1-60 Min-No Report  Result Date: 02/11/2021 CLINICAL DATA:  Surgical internal fixation of left ankle fracture. EXAM: LEFT ANKLE COMPLETE - 3+ VIEW; DG C-ARM 1-60 MIN-NO REPORT Radiation exposure index: 0.58 mGy. COMPARISON:  February 10, 2021. FINDINGS: Five intraoperative fluoroscopic images were obtained of the left ankle. These demonstrate the patient be status post surgical internal fixation of distal left fibular and tibial fractures. Good alignment of fracture components is noted. IMPRESSION: Fluoroscopic guidance provided during surgical internal fixation of distal left tibial and fibular fractures. Electronically Signed   By: Marijo Conception M.D.   On: 02/11/2021 17:15   US THYROID  Result Date: 02/11/2021 CLINICAL DATA:  Single thyroid nodule EXAM: THYROID ULTRASOUND TECHNIQUE: Ultrasound examination of the thyroid gland and adjacent soft tissues was performed.  COMPARISON:  11/21/2012 FINDINGS: Parenchymal Echotexture: Mildly heterogeneous Isthmus: 0.3 cm Right lobe: 3.9 x 2.4 x 1.6 cm Left lobe: 3.2 x 1.5 x 1.3 cm _________________________________________________________ Estimated total number of nodules >/= 1 cm: 2 Number of spongiform nodules >/=  2 cm not described below (TR1): 0 Number of mixed cystic and solid nodules >/= 1.5 cm not described below ([place]): 0 _________________________________________________________ Nodule # 1: Location: Right; inferior Maximum size: 1.9 cm; Other 2 dimensions: 1.6 x 1.6 cm Composition: solid/almost completely solid (2) Echogenicity: isoechoic (1) Shape: not taller-than-wide (0) Margins: smooth (0) Echogenic foci: none (0) ACR TI-RADS total points: 3. ACR TI-RADS risk category: TR3 (3 points). ACR TI-RADS recommendations: *Given size (>/= 1.5 - 2.4 cm) and appearance, a follow-up ultrasound in 1 year should be considered based on TI-RADS criteria. _________________________________________________________ Nodule # 2: Prior biopsy: No Location: Left; inferior Maximum size: 1.7 cm; Other 2 dimensions: 1.3 x 0.9 cm, previously, 1.3 x 1.1 x 1.1 cm Composition: solid/almost  completely solid (2) Echogenicity: isoechoic (1) Shape: not taller-than-wide (0) Margins: smooth (0) Echogenic foci: none (0) ACR TI-RADS total points: 3. ACR TI-RADS risk category:  TR3 (3 points). Significant change in size (>/= 20% in two dimensions and minimal increase of 2 mm): No Change in features: No Change in ACR TI-RADS risk category: No ACR TI-RADS recommendations: Given stability since 2014, findings are consistent with a benign etiology. _________________________________________________________ IMPRESSION: 1. Nodule 1 (TI-RADS 3) is new compared to prior thyroid ultrasound. It meets criteria for imaging follow-up. Annual ultrasound surveillance is recommended until 5 years of stability is documented. 2. Nodule 2 is not significantly changed in size since 2014  which indicates a benign etiology. The above is in keeping with the ACR TI-RADS recommendations - J Am Coll Radiol 2017;14:587-595. Electronically Signed   By: Miachel Roux M.D.   On: 02/11/2021 07:43        Scheduled Meds:  acetaminophen  1,000 mg Oral Q6H   carvedilol  3.125 mg Oral BID WC   chlordiazePOXIDE  10 mg Oral TID   DULoxetine  60 mg Oral Daily   enoxaparin (LOVENOX) injection  40 mg Subcutaneous Daily   folic acid  1 mg Oral Daily   LORazepam  0-4 mg Intravenous Q4H   Followed by   Derrill Memo ON 02/13/2021] LORazepam  0-4 mg Intravenous Q8H   multivitamin with minerals  1 tablet Oral Daily   thiamine  100 mg Oral Daily   Or   thiamine  100 mg Intravenous Daily   Continuous Infusions:  lactated ringers 100 mL/hr at 02/12/21 4196   methocarbamol (ROBAXIN) IV     sodium phosphate  Dextrose 5% IVPB 30 mmol (02/12/21 1000)     LOS: 2 days      Phillips Climes, MD [facility]   To contact the attending provider between 7A-7P or the covering provider during after hours 7P-7A, please log into the web site [email or web address] and access using universal [place] password for that web site. If you do not have the password, please call the hospital operator.  02/12/2021, 3:24 PM

## 2021-02-12 NOTE — Progress Notes (Signed)
This nurse is at the room while this was recorded, patient is on the phone conversing with mother while having tremors, ativan given to patient.

## 2021-02-12 NOTE — Evaluation (Signed)
Physical Therapy Evaluation Patient Details Name: Terri Ayala MRN: [id number] DOB: August 10, 1955 Today's Date: 02/12/2021  History of Present Illness  65 yo admitted 12/14 after found on floor at home with fall. Pt with left ankle fx s/p ORIF, Lt 12th rib fx and Lt L1 TVP fx. PMhx: ETOH abuse, chronic LBP, depression, anxiety  Clinical Impression  Pt pleasant but HOH and lives alone with her dog. Pt reports steps to enter home at 2 exits and no one whom she can stay with or with her. Pt unable to maintain and sufficiently recall her NWB status needing assist and cues with all transfers. Pt unable to safely and successfully stand with NWB and educated for lateral scoot transfers at this time to maintain precautions and safety. Pt with decreased strength, cognition and mobility who will benefit from acute therapy to maximize safety and function to decrease fall risk and burden of care.       Recommendations for follow up therapy are one component of a multi-disciplinary discharge planning process, led by the attending physician.  Recommendations may be updated based on patient status, additional functional criteria and insurance authorization.  Follow Up Recommendations Skilled nursing-short term rehab (<3 hours/day)    Assistance Recommended at Discharge Frequent or constant Supervision/Assistance  Functional Status Assessment Patient has had a recent decline in their functional status and demonstrates the ability to make significant improvements in function in a reasonable and predictable amount of time.  Equipment Recommendations  Wheelchair (measurements PT);BSC/3in1    Recommendations for Other Services       Precautions / Restrictions Precautions Precautions: Fall Restrictions Weight Bearing Restrictions: Yes LLE Weight Bearing: Non weight bearing      Mobility  Bed Mobility Overal bed mobility: Needs Assistance Bed Mobility: Supine to Sit Rolling: Min assist   Supine to sit:  Min assist Sit to supine: Min assist   General bed mobility comments: min assist to fully elevate trunk from surface with increased time and cues as pt trying to push through LLE to bridge with max cues to maintain NWB, increaed time to scoot to EOB. decreased awareness of too near edge after standing trial    Transfers Overall transfer level: Needs assistance Equipment used: Rolling walker (2 wheels) Transfers: Sit to/from Stand;Bed to chair/wheelchair/BSC Sit to Stand: Mod assist          Lateral/Scoot Transfers: Min guard General transfer comment: mod assist with bed elevated, LLE positioned on P.T. foot to maintain NwB. Max cues for hand placement and full rise to standing pt achieve knee extension but maintained trunk flexion with lack of pushing through bil UE despite cues and assist. Pt then transitioned to lateral scoot with drop arm with guarding to maintain NWB    Ambulation/Gait               General Gait Details: unable  Stairs            Wheelchair Mobility    Modified Rankin (Stroke Patients Only)       Balance Overall balance assessment: Needs assistance Sitting-balance support: No upper extremity supported;Feet supported Sitting balance-Leahy Scale: Fair Sitting balance - Comments: EOb without physical assist   Standing balance support: Bilateral upper extremity supported;Reliant on assistive device for balance Standing balance-Leahy Scale: Poor Standing balance comment: mod assist with RW and NWB LLE  Pertinent Vitals/Pain Pain Assessment: 0-10 Pain Score: 4  Pain Location: left ankle Pain Descriptors / Indicators: Guarding;Aching Pain Intervention(s): Limited activity within patient's tolerance;Monitored during session;Repositioned    Home Living Family/patient expects to be discharged to:: Private residence Living Arrangements: Alone Available Help at Discharge: Friend(s);Available  PRN/intermittently Type of Home: House Home Access: Stairs to enter Entrance Stairs-Rails: Right;Left Entrance Stairs-Number of Steps: 1   Home Layout: Two level;Able to live on main level with bedroom/bathroom;1/2 bath on main level Home Equipment: Rolling Walker (2 wheels);Cane - single point;BSC/3in1;Shower seat      Prior Function Prior Level of Function : Independent/Modified Independent             Mobility Comments: pt reports that she used a RW at home ADLs Comments: pt reports that she was Ind with ADLs. IADLs, home mgt and was driving     Hand Dominance   Dominant Hand: Right    Extremity/Trunk Assessment   Upper Extremity Assessment Upper Extremity Assessment: Generalized weakness    Lower Extremity Assessment Lower Extremity Assessment: Generalized weakness;LLE deficits/detail LLE Deficits / Details: ankle splint with max cues to prevent NWB    Cervical / Trunk Assessment Cervical / Trunk Assessment: Normal  Communication   Communication: HOH  Cognition Arousal/Alertness: Awake/alert Behavior During Therapy: Flat affect Overall Cognitive Status: Impaired/Different from baseline Area of Impairment: Safety/judgement;Problem solving;Following commands                       Following Commands: Follows one step commands inconsistently Safety/Judgement: Decreased awareness of safety;Decreased awareness of deficits   Problem Solving: Slow processing;Requires verbal cues;Requires tactile cues         General Comments      Exercises General Exercises - Lower Extremity Long Arc Quad: AROM;Both;Seated;10 reps Hip Flexion/Marching: AROM;Both;Seated;10 reps   Assessment/Plan    PT Assessment Patient needs continued PT services  PT Problem List Decreased strength;Decreased mobility;Decreased safety awareness;Decreased activity tolerance;Decreased balance;Decreased knowledge of use of DME;Decreased cognition;Decreased knowledge of precautions        PT Treatment Interventions DME instruction;Therapeutic exercise;Functional mobility training;Therapeutic activities;Patient/family education;Cognitive remediation;Gait training;Balance training    PT Goals (Current goals can be found in the Care Plan section)  Acute Rehab PT Goals Patient Stated Goal: return home to my dog PT Goal Formulation: With patient Time For Goal Achievement: 02/26/21 Potential to Achieve Goals: Fair    Frequency Min 3X/week   Barriers to discharge Decreased caregiver support      Co-evaluation               AM-PAC PT "6 Clicks" Mobility  Outcome Measure Help needed turning from your back to your side while in a flat bed without using bedrails?: A Little Help needed moving from lying on your back to sitting on the side of a flat bed without using bedrails?: A Little Help needed moving to and from a bed to a chair (including a wheelchair)?: A Little Help needed standing up from a chair using your arms (e.g., wheelchair or bedside chair)?: A Lot Help needed to walk in hospital room?: Total Help needed climbing 3-5 steps with a railing? : Total 6 Click Score: 13    End of Session Equipment Utilized During Treatment: Gait belt Activity Tolerance: Patient tolerated treatment well Patient left: in chair;with call bell/phone within reach;with chair alarm set Nurse Communication: Mobility status PT Visit Diagnosis: Other abnormalities of gait and mobility (R26.89);Difficulty in walking, not elsewhere classified (R26.2);Muscle weakness (generalized) (M62.81);History  of falling (Z91.81)    Time: 7837-5423 PT Time Calculation (min) (ACUTE ONLY): 22 min   Charges:   PT Evaluation $PT Eval Moderate Complexity: 1 Mod          [place], PT Acute Rehabilitation Services Pager: [phone] Office: [phone]   Sandy Salaam Rumaldo Difatta 02/12/2021, 12:26 PM

## 2021-02-12 NOTE — Care Management Important Message (Signed)
Important Message  Patient Details  Name: Terri Ayala MRN: [id number] Date of Birth: April 01, 1955   Medicare Important Message Given:  Yes     Hannah Beat 02/12/2021, 2:44 PM

## 2021-02-12 NOTE — Evaluation (Signed)
Clinical/Bedside Swallow Evaluation Patient Details  Name: Terri Ayala MRN: [id number] Date of Birth: 1955-07-06  Today's Date: 02/12/2021 Time: SLP Start Time (ACUTE ONLY): 0900 SLP Stop Time (ACUTE ONLY): 7048 SLP Time Calculation (min) (ACUTE ONLY): 12 min  Past Medical History:  Past Medical History:  Diagnosis Date   Alcohol withdrawal ([place]) 11/2017; 01/04/2018   Alcoholism ([place])    Anxiety    Arthritis    "hands" (01/04/2018)   ASYMPTOMATIC POSTMENOPAUSAL STATUS 08/17/2009   Chronic lower back pain    Depression    DYSPHAGIA 06/19/2007   Fatty liver, alcoholic    GERD (gastroesophageal reflux disease)    GOITER, MULTINODULAR 08/17/2009   Hyperlipidemia    Migraines    "not sure what triggers them; I'll have 1 q couple weeks or more; come 2 days in a row when they come" (01/04/2018)   Mitral valve prolapse    OSTEOPOROSIS 08/17/2009   PONV (postoperative nausea and vomiting)    Supraventricular tachycardia ([place])    Past Surgical History:  Past Surgical History:  Procedure Laterality Date   BREAST BIOPSY Right    "benign"   CATARACT EXTRACTION W/ INTRAOCULAR LENS  IMPLANT, BILATERAL     CERVICAL CONE BIOPSY  2000s   CERVIX LESION DESTRUCTION  1991   "dysplasia; lesions"   CHOLECYSTECTOMY N/A 03/14/2013   Procedure: LAPAROSCOPIC CHOLECYSTECTOMY WITH ATTEMPTED INTRAOPERATIVE CHOLANGIOGRAM;  Surgeon: Harl Bowie, MD;  Location: [facility];  Service: General;  Laterality: N/A;   laporoscopic abdominal surgery     "for endometriosis"   LEFT HEART CATH AND CORONARY ANGIOGRAPHY N/A 05/07/2018   Procedure: LEFT HEART CATH AND CORONARY ANGIOGRAPHY;  Surgeon: Belva Crome, MD;  Location: [facility];  Service: Cardiovascular;  Laterality: N/A;   HPI:  Pt is a 65 y.o. female who presents by EMS after a fall at home. Pt reported on admission that she stopped drinking alcohol about a week prior to admission, but had been drinking 2-3 bottles of wine a day.  CT head negative.  Pt found to have fracture of her left ankle as well as an L1 transverse process fracture. Pt s/p ORIF left ankle. PMH: alcohol abuse, HTN, GERD, osteoporosis.    Assessment / Plan / Recommendation  Clinical Impression  Pt was seen for bedside swallow evaluation. Pt's NPO status was discussed with pt's RN who advised that SLP proceed with the evaluation since the pt was likely NPO from having the surgery. Pt denied a history of dysphagia with food/liquids, but stated that some soft-gel pills get stuck when she swallows. Pt identified the pharynx as the site of the sensation. Oral mechanism exam was [facility] and she presented with adequate, natural dentition. She tolerated all solids and liquids without signs or symptoms of oropharyngeal dysphagia. A regular texture diet with thin liquids is recommended at this time. SLP will follow briefly to ensure diet tolerance. SLP Visit Diagnosis: Dysphagia, unspecified (R13.10)    Aspiration Risk  No limitations    Diet Recommendation Regular;Thin liquid   Liquid Administration via: Cup;Straw Medication Administration: Whole meds with puree Supervision: Patient able to self feed Postural Changes: Seated upright at 90 degrees    Other  Recommendations Oral Care Recommendations: Oral care BID    Recommendations for follow up therapy are one component of a multi-disciplinary discharge planning process, led by the attending physician.  Recommendations may be updated based on patient status, additional functional criteria and insurance authorization.  Follow up Recommendations No SLP follow  up      Assistance Recommended at Discharge None  Functional Status Assessment Patient has not had a recent decline in their functional status  Frequency and Duration min 1 x/week  1 week       Prognosis        Swallow Study   General Date of Onset: 02/11/21 HPI: Pt is a 65 y.o. female who presents by EMS after a fall at home. Pt reported on admission that she  stopped drinking alcohol about a week prior to admission, but had been drinking 2-3 bottles of wine a day.  CT head negative. Pt found to have fracture of her left ankle as well as an L1 transverse process fracture. Pt s/p ORIF left ankle. PMH: alcohol abuse, HTN, GERD, osteoporosis. Type of Study: Bedside Swallow Evaluation Previous Swallow Assessment: none Diet Prior to this Study: NPO Temperature Spikes Noted: No Respiratory Status: Room air History of Recent Intubation: Yes Length of Intubations (days):  (for surgery only) Date extubated: 02/11/21 Behavior/Cognition: Alert;Cooperative;Pleasant mood Oral Cavity Assessment: Within Functional Limits Oral Care Completed by SLP: No Oral Cavity - Dentition: Adequate natural dentition Vision: Functional for self-feeding Self-Feeding Abilities: Able to feed self Patient Positioning: Upright in bed;Postural control adequate for testing Baseline Vocal Quality: Normal Volitional Cough: Strong Volitional Swallow: Able to elicit    Oral/Motor/Sensory Function Overall Oral Motor/Sensory Function: Within functional limits   Ice Chips Ice chips: Within functional limits Presentation: Spoon   Thin Liquid Thin Liquid: Within functional limits Presentation: Straw    Nectar Thick Nectar Thick Liquid: Not tested   Honey Thick Honey Thick Liquid: Not tested   Puree Puree: Within functional limits Presentation: Spoon   Solid     Solid: Within functional limits Presentation: [place] I[address] Hardin Negus, [place], [place] Office number [phone] Pager [phone]  Horton Marshall 02/12/2021,9:16 AM

## 2021-02-12 NOTE — Progress Notes (Addendum)
CSW received call from Ms. Carmichael with [facility]PS. She is following patient and will visit her today. TOC will continue to follow for discharge needs.   Gilmore Laroche, MSW, [facility]

## 2021-02-12 NOTE — Evaluation (Signed)
Occupational Therapy Evaluation Patient Details Name: Terri Ayala MRN: [id number] DOB: 03-31-1955 Today's Date: 02/12/2021   History of Present Illness 65 yo admitted 12/14 after found on floor at home with fall. Pt with left ankle fx s/p ORIF, Lt 12th rib fx and Lt L1 TVP fx. PMhx: ETOH abuse, chronic LBP, depression, anxiety   Clinical Impression   Pt presents with decline in function and safety with ADLs and ADL mobility with impaired strength, balance and safety awareness. Pt reports that PTA, she lived at home alone with her dog, has steps to enter can live on min level with half bath; pt was Ind with ADLs. IADLs, home mgt and was driving. Pt currently requires min A to sit EOB, mod A sit - stand with RW but unable to maintain L LE NWB, min guard A with UB ADLs, max - total A with LB ADLs and total A with toileting. Pt would benefit from acute OT services to address impairments to maximize level of function and safety      Recommendations for follow up therapy are one component of a multi-disciplinary discharge planning process, led by the attending physician.  Recommendations may be updated based on patient status, additional functional criteria and insurance authorization.   Follow Up Recommendations  Skilled nursing-short term rehab (<3 hours/day)    Assistance Recommended at Discharge Frequent or constant Supervision/Assistance  Functional Status Assessment  Patient has had a recent decline in their functional status and demonstrates the ability to make significant improvements in function in a reasonable and predictable amount of time.  Equipment Recommendations  Other (comment) (TBD at SNF)    Recommendations for Other Services       Precautions / Restrictions Precautions Precautions: Fall Restrictions Weight Bearing Restrictions: Yes LLE Weight Bearing: Non weight bearing      Mobility Bed Mobility Overal bed mobility: Needs Assistance Bed Mobility: Supine to  Sit Rolling: Min assist   Supine to sit: Min assist Sit to supine: Min assist   General bed mobility comments: min assist to fully elevate trunk from surface with increased time and cues as pt trying to push through LLE to bridge with max cues to maintain NWB, increaed time to scoot to EOB. decreased awareness of too near edge after standing trial    Transfers Overall transfer level: Needs assistance Equipment used: Rolling walker (2 wheels) Transfers: Sit to/from Stand Sit to Stand: Mod assist          Lateral/Scoot Transfers: Min guard General transfer comment: Max cues for scoting hips to EOB and for  hand placement. Pt unable to maintain L LE NWB      Balance Overall balance assessment: Needs assistance Sitting-balance support: No upper extremity supported;Feet supported Sitting balance-Leahy Scale: Fair Sitting balance - Comments: EOb without physical assist   Standing balance support: Bilateral upper extremity supported;Reliant on assistive device for balance;During functional activity Standing balance-Leahy Scale: Poor Standing balance comment: mod assist with RW and NWB LLE                           ADL either performed or assessed with clinical judgement   ADL Overall ADL's : Needs assistance/impaired Eating/Feeding: Independent;Sitting   Grooming: Wash/dry hands;Wash/dry face;Min guard;Sitting   Upper Body Bathing: Min guard;Sitting   Lower Body Bathing: Maximal assistance;Sitting/lateral leans   Upper Body Dressing : Min guard;Sitting   Lower Body Dressing: Total assistance     Toilet Transfer Details (indicate  cue type and reason): unable to SPT while maintaining L LE NWB, mod A sit - stand from EOB Toileting- Clothing Manipulation and Hygiene: Total assistance;Bed level               Vision Baseline Vision/History: 1 Wears glasses Ability to See in Adequate Light: 0 Adequate Patient Visual Report: No change from baseline        Perception     Praxis      Pertinent Vitals/Pain Pain Assessment: 0-10 Pain Score: 5  Pain Location: left ankle Pain Descriptors / Indicators: Guarding;Aching;Discomfort;Heaviness Pain Intervention(s): Monitored during session;Repositioned;RN gave pain meds during session     Hand Dominance Right   Extremity/Trunk Assessment Upper Extremity Assessment Upper Extremity Assessment: Generalized weakness   Lower Extremity Assessment Lower Extremity Assessment: Defer to PT evaluation LLE Deficits / Details: ankle splint with max cues to prevent NWB   Cervical / Trunk Assessment Cervical / Trunk Assessment: Normal   Communication Communication Communication: HOH   Cognition Arousal/Alertness: Awake/alert Behavior During Therapy: Flat affect Overall Cognitive Status: Impaired/Different from baseline Area of Impairment: Safety/judgement;Problem solving;Following commands                       Following Commands: Follows one step commands inconsistently Safety/Judgement: Decreased awareness of safety;Decreased awareness of deficits   Problem Solving: Slow processing;Requires verbal cues;Requires tactile cues       General Comments       Exercises General Exercises - Lower Extremity Long Arc Quad: AROM;Both;Seated;10 reps Hip Flexion/Marching: AROM;Both;Seated;10 reps   Shoulder Instructions      Home Living Family/patient expects to be discharged to:: Private residence Living Arrangements: Alone Available Help at Discharge: Friend(s);Available PRN/intermittently Type of Home: House Home Access: Stairs to enter [employer] of Steps: 1 Entrance Stairs-Rails: Right;Left Home Layout: Two level;Able to live on main level with bedroom/bathroom;1/2 bath on main level     Bathroom Shower/Tub: Teacher, early years/pre: Standard     Home Equipment: Conservation officer, nature (2 wheels);Cane - single point;BSC/3in1;Shower seat          Prior  Functioning/Environment Prior Level of Function : Independent/Modified Independent             Mobility Comments: pt reports that she used a RW at home ADLs Comments: pt reports that she was Ind with ADLs. IADLs, home mgt and was driving        OT Problem List: Decreased strength;Impaired balance (sitting and/or standing);Decreased knowledge of precautions;Pain;Decreased safety awareness;Decreased activity tolerance;Decreased coordination;Decreased knowledge of use of DME or AE      OT Treatment/Interventions: Self-care/ADL training;Patient/family education;Balance training;Therapeutic activities;DME and/or AE instruction    OT Goals(Current goals can be found in the care plan section) Acute Rehab OT Goals Patient Stated Goal: go home OT Goal Formulation: With patient Time For Goal Achievement: 02/26/21 ADL Goals Pt Will Perform Grooming: with supervision;sitting Pt Will Perform Upper Body Bathing: with supervision;with set-up;sitting Pt Will Perform Lower Body Bathing: with mod assist;with min assist;sitting/lateral leans Pt Will Perform Upper Body Dressing: with supervision;with set-up;sitting Pt Will Transfer to Toilet: with mod assist;with min assist;stand pivot transfer;anterior/posterior transfer;bedside commode (lateral scoot)  OT Frequency: Min 2X/week   Barriers to D/C:            Co-evaluation              AM-PAC OT "6 Clicks" Daily Activity     Outcome Measure Help from another person eating meals?: None Help from  another person taking care of personal grooming?: A Little Help from another person toileting, which includes using toliet, bedpan, or urinal?: Total Help from another person bathing (including washing, rinsing, drying)?: A Lot Help from another person to put on and taking off regular upper body clothing?: A Little Help from another person to put on and taking off regular lower body clothing?: Total 6 Click Score: 14   End of Session Equipment  Utilized During Treatment: Gait belt;Rolling walker (2 wheels)  Activity Tolerance: Patient tolerated treatment well Patient left: in bed;with call bell/phone within reach;with bed alarm set;with nursing/sitter in room  OT Visit Diagnosis: Unsteadiness on feet (R26.81);Other abnormalities of gait and mobility (R26.89);History of falling (Z91.81);Muscle weakness (generalized) (M62.81);Other symptoms and signs involving cognitive function;Pain Pain - Right/Left: Left Pain - part of body: Leg;Ankle and joints of foot                Time: 3810-1751 OT Time Calculation (min): 28 min Charges:  OT General Charges $OT Visit: 1 Visit OT Evaluation $OT Eval Moderate Complexity: 1 Mod OT Treatments $Self Care/Home Management : 8-22 mins    Britt Bottom 02/12/2021, 12:56 PM

## 2021-02-12 NOTE — Progress Notes (Signed)
Orthopaedic Trauma Service Progress Note  Patient ID: Terri Ayala MRN: [id number] DOB/AGE: 08/18/1955 65 y.o.  Subjective:  No acute issues Popliteal block still appears to be working  No other complaints  Discussed with PT, recommending snf. Suspect pt will be noncompliant if she goes home  Vitamin d levels look good   ROS As above  Objective:   VITALS:   Vitals:   02/12/21 0034 02/12/21 0344 02/12/21 0807 02/12/21 1152  BP:  140/77 128/75 135/78  Pulse:  97 100 (!) 104  Resp: 19 20 19 20   Temp:  98.7 F (37.1 C) 98.5 F (36.9 C)   TempSrc:  Oral Oral   SpO2:  100% 99% 98%    Estimated body mass index is 26.25 kg/m as calculated from the following:   Height as of 12/07/20: 5\' 2"  (1.575 m).   Weight as of 12/07/20: 65.1 kg.   Intake/Output      12/15 0701 12/16 0700 12/16 0701 12/17 0700   P.O. 60 240   I.V. 2283.9    IV Piggyback 250.1    Total Intake 2594 240   Urine 300    Total Output 300    Net +2294 +240          LABS  Results for orders placed or performed during the hospital encounter of 02/10/21 (from the past 24 hour(s))  CBC     Status: Abnormal   Collection Time: 02/12/21  1:06 AM  Result Value Ref Range   WBC 7.9 4.0 - 10.5 K/uL   RBC 3.12 (L) 3.87 - 5.11 MIL/uL   Hemoglobin 10.1 (L) 12.0 - 15.0 g/dL   HCT 29.8 (L) 36.0 - 46.0 %   MCV 95.5 80.0 - 100.0 fL   MCH 32.4 26.0 - 34.0 pg   MCHC 33.9 30.0 - 36.0 g/dL   RDW 13.1 11.5 - 15.5 %   Platelets 96 (L) 150 - 400 K/uL   nRBC 0.0 0.0 - 0.2 %  Basic metabolic panel     Status: Abnormal   Collection Time: 02/12/21  1:06 AM  Result Value Ref Range   Sodium 137 135 - 145 mmol/L   Potassium 3.9 3.5 - 5.1 mmol/L   Chloride 106 98 - 111 mmol/L   CO2 18 (L) 22 - 32 mmol/L   Glucose, Bld 123 (H) 70 - 99 mg/dL   BUN 11 8 - 23 mg/dL   Creatinine, Ser 0.81 0.44 - 1.00 mg/dL   Calcium 7.9 (L) 8.9 - 10.3 mg/dL    GFR, Estimated >60 >60 mL/min   Anion gap 13 5 - 15  Phosphorus     Status: Abnormal   Collection Time: 02/12/21  1:06 AM  Result Value Ref Range   Phosphorus 2.1 (L) 2.5 - 4.6 mg/dL  Magnesium     Status: None   Collection Time: 02/12/21  1:06 AM  Result Value Ref Range   Magnesium 1.8 1.7 - 2.4 mg/dL  VITAMIN D 25 Hydroxy (Vit-D Deficiency, Fractures)     Status: None   Collection Time: 02/12/21  1:06 AM  Result Value Ref Range   Vit D, 25-Hydroxy 42.20 30 - 100 ng/mL     PHYSICAL EXAM:   Gen: sitting up in chair, NAD Ext:       Left Lower Extremity  SLS clean, dry and intact  Ext warm  No significant swelling   + DP pulse  No motor or sens noted as block still working   Assessment/Plan: 1 Day Post-Op   Principal Problem:   Alcohol withdrawal (HCC) Active Problems:   Essential hypertension   Ankle fracture, left, closed, initial encounter   L1 vertebral fracture (HCC)   Thyroid nodule   Prolonged QT interval   Anti-infectives (From admission, onward)    Start     Dose/Rate Route Frequency Ordered Stop   02/11/21 2200  ceFAZolin (ANCEF) IVPB 2g/100 mL premix        2 g 200 mL/hr over 30 Minutes Intravenous Every 8 hours 02/11/21 1920 02/12/21 2159   02/11/21 1147  ceFAZolin (ANCEF) 2-4 GM/100ML-% IVPB       Note to Pharmacy: Humberto Leep O: cabinet override      02/11/21 1147 02/11/21 2359     .  POD/HD#: 1  65 y/o female, ground level fall with L ankle fracture   - L ankle fracture s/p ORIF, bimalleolar ankle fracture with unstable syndesmosis)  NWB L leg x 6-8 weeks  Knee and toe ROM as tolerated   Ice and elevate for swelling and pain control  PT/OT   Splint x 2 weeks then CAM so pt can begin ankle ROM   - Pain management:  Multimodal   - ABL anemia/Hemodynamics  Stable  - Medical issues   Per primary   - DVT/PE prophylaxis:  On prophylactic lovenox  Could xarelto 10 mg daily x 21 days at dc   - ID:   Periop abx  - Metabolic  Bone Disease:  This is a fragility fracture given mechanism  Likely multifactorial due to age/postmenopausal sate as well as alcohol abuse    Vitamin d levels look good   Needs DEXA as outpt   - Activity:  NWB L leg o/w as tolerated   - Impediments to fracture healing:  Age  Etoh use  - Dispo:  Ortho issues stable  Follow up with ortho in 10-14 days      Jari Pigg, PA-C [phone] (C) 02/12/2021, 12:33 PM  [facility] [place] [address] [phone] Jenetta [phone] (F)    After 5pm and on the weekends please log on to Amion, go to orthopaedics and the look under the [facility] Call for the provider(s) on call. You can also call our office at [phone] and then follow the prompts to be connected to the call team.   Patient ID: Terri Ayala, female   DOB: 06/21/1955, 65 y.o.   MRN: [id number]

## 2021-02-13 DIAGNOSIS — I10 Essential (primary) hypertension: Secondary | ICD-10-CM

## 2021-02-13 LAB — CBC
HCT: 27.3 % — ABNORMAL LOW (ref 36.0–46.0)
Hemoglobin: 9.3 g/dL — ABNORMAL LOW (ref 12.0–15.0)
MCH: 31.8 pg (ref 26.0–34.0)
MCHC: 34.1 g/dL (ref 30.0–36.0)
MCV: 93.5 fL (ref 80.0–100.0)
Platelets: 116 10*3/uL — ABNORMAL LOW (ref 150–400)
RBC: 2.92 MIL/uL — ABNORMAL LOW (ref 3.87–5.11)
RDW: 12.7 % (ref 11.5–15.5)
WBC: 5.6 10*3/uL (ref 4.0–10.5)
nRBC: 0 % (ref 0.0–0.2)

## 2021-02-13 LAB — BASIC METABOLIC PANEL
Anion gap: 8 (ref 5–15)
BUN: 5 mg/dL — ABNORMAL LOW (ref 8–23)
CO2: 24 mmol/L (ref 22–32)
Calcium: 7.6 mg/dL — ABNORMAL LOW (ref 8.9–10.3)
Chloride: 104 mmol/L (ref 98–111)
Creatinine, Ser: 0.52 mg/dL (ref 0.44–1.00)
GFR, Estimated: >60 mL/min (ref >60)
Glucose, Bld: 90 mg/dL (ref 70–99)
Potassium: 2.8 mmol/L — ABNORMAL LOW (ref 3.5–5.1)
Sodium: 136 mmol/L (ref 135–145)

## 2021-02-13 LAB — CK: Total CK: 433 U/L — ABNORMAL HIGH (ref 38–234)

## 2021-02-13 LAB — PHOSPHORUS: Phosphorus: 1.9 mg/dL — ABNORMAL LOW (ref 2.5–4.6)

## 2021-02-13 MED ORDER — POTASSIUM CHLORIDE CRYS ER 20 MEQ PO TBCR
40.0000 meq | EXTENDED_RELEASE_TABLET | Freq: Four times a day (QID) | ORAL | Status: AC
  Administered 2021-02-13 (×2): 40 meq via ORAL
  Filled 2021-02-13 (×2): qty 2

## 2021-02-13 MED ORDER — POTASSIUM PHOSPHATES 15 MMOLE/5ML IV SOLN
30.0000 mmol | Freq: Once | INTRAVENOUS | Status: AC
  Administered 2021-02-13: 30 mmol via INTRAVENOUS
  Filled 2021-02-13: qty 10

## 2021-02-13 MED ORDER — K PHOS MONO-SOD PHOS DI & MONO 155-852-130 MG PO TABS
250.0000 mg | ORAL_TABLET | Freq: Three times a day (TID) | ORAL | Status: AC
  Administered 2021-02-13 – 2021-02-14 (×4): 250 mg via ORAL
  Filled 2021-02-13 (×4): qty 1

## 2021-02-13 NOTE — TOC Progression Note (Signed)
Transition of Care [facility]) - Progression Note    Patient Details  Name: Terri Ayala MRN: [id number] Date of Birth: 1955-11-07  Transition of Care [facility]) CM/SW [place], [place] Phone Number: 02/13/2021, 1:37 PM  Clinical Narrative:    CSW spoke with pt's mother at pt's request. Pt's mother would like staff to know that she has hired a biohazard company to clean pt's home while pt is at [facility]. 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 Pt's mother states that the home will be cleaned up by the time the pt leaves the SNF. Pt's mom is thankful for the help of the staff here at [facility].    Expected Discharge Plan: [place] Barriers to Discharge: Continued Medical Work up, Ship broker  Expected Discharge Plan and Services Expected Discharge Plan: [place] In-house Referral: Clinical Social Work Discharge Planning Services: CM Consult   Living arrangements for the past 2 months: Single Family Home                                       Social Determinants of Health (SDOH) Interventions    Readmission Risk Interventions No flowsheet data found.

## 2021-02-13 NOTE — TOC Initial Note (Signed)
Transition of Care [facility]) - Initial/Assessment Note    Patient Details  Name: Terri Ayala MRN: [id number] Date of Birth: [birth year]  Transition of Care [facility]) CM/SW Contact:    [address], [place] Phone Number: 02/13/2021, 1:16 PM  Clinical Narrative:                 CSW received consult for possible SNF placement at time of discharge. CSW spoke with patient. Patient reported that she is currently unable to care for herself at her home given her current physical needs and fall risk. Patient expressed understanding of PT recommendation and is agreeable to SNF placement at time of discharge. Patient reports preference for the [place] area. CSW discussed insurance authorization process and will provide Medicare SNF ratings list. Patient has received  COVID vaccines and booster. Patient expressed being hopeful for rehab and to feel better soon. No further questions reported at this time. There is current APS involvement and pt may be difficult to place due to chronic alcohol use. Pt states she stopped drinking two weeks ago.   Skilled Nursing Rehab Facilities-   [email or web address]   Ratings out of 5 possible   Name Address  Phone # [employer] Inspection Overall  [facility] [address] [place] 5 5 2 4   [facility]  [address], [place] [phone] 2 5 5   [facility] [place], [place] 4 1 1 1   [place], [place] 2 2 4 4   [facility] [address] [place] 2 1 1 1   [place] 3 1 4 3   [facility] [address] [place] 5 2 2 3   [facility] [address], [place] 4 1 2 1   [place] [facility] [place], [place] [phone] 5 1 2 2   [facility] [phone],  [place] [phone] 4 1 1 1   [facility] [address] [facility] [phone] 4 1 2 1   [place] [phone] 4 1 1 1       Expected Discharge Plan: [place] Barriers to Discharge: Continued Medical Work up   Patient Goals and CMS Choice        Expected Discharge Plan and Services Expected Discharge Plan: [place] In-house Referral: Clinical Social Work Discharge Planning Services: CM Consult   Living arrangements for the past 2 months: [place]                                      Prior Living Arrangements/Services Living arrangements for the past 2 months: Single Family Home Lives with:: Self Patient language and need for interpreter reviewed:: Yes        Need for Family Participation in Patient Care: Yes (Comment) Care giver support system in place?:  (family does not live closeby)   Criminal Activity/Legal Involvement Pertinent to Current Situation/Hospitalization: No - Comment as needed  Activities of Daily Living Home Assistive Devices/Equipment: Environmental consultant (specify type) ADL Screening (condition at time of admission) Patient's cognitive ability adequate to safely complete daily activities?: Yes Is the patient deaf or have difficulty hearing?: No Does the patient have difficulty seeing, even when wearing glasses/contacts?: No Does the patient have difficulty concentrating,  remembering, or making decisions?: No Patient able to express need for assistance with ADLs?: Yes Does the patient have difficulty dressing or bathing?: No Independently performs ADLs?: Yes (appropriate for developmental age) Does the patient have difficulty walking or climbing stairs?: Yes Weakness of Arms/Hands: None  Permission Sought/Granted                  Emotional Assessment       Orientation: : Fluctuating Orientation (Suspected and/or reported  Sundowners) Alcohol / Substance Use: Alcohol Use, Tobacco Use Psych Involvement: No (comment)  Admission diagnosis:  Alcohol withdrawal ([place]) [F10.939] Fall [W19.XXXA] Single thyroid nodule [E04.1] Patient Active Problem List   Diagnosis Date Noted   Ankle fracture, left, closed, initial encounter 02/10/2021   L1 vertebral fracture ([place]) 02/10/2021   Thyroid nodule 02/10/2021   Prolonged QT interval 02/10/2021   Elevated cholesterol 10/19/2020   Pedal edema 10/19/2020   History of colonic polyps 03/25/2020   Hemorrhage of rectum and anus 03/25/2020   Gastroparesis 03/25/2020   Gastroesophageal reflux disease 03/25/2020   Flatulence, eructation and gas pain 03/25/2020   Family history of malignant neoplasm of gastrointestinal tract 32/91/9166   Eosinophilic esophagitis 06/00/4599   Constipation 03/25/2020   Change in bowel habit 03/25/2020   Hospital discharge follow-up 77/41/4239   Metabolic acidosis 53/20/2334   Abnormal liver function 02/06/2019   Alcohol withdrawal delirium ([place]) 02/06/2019   Elevated LFTs 07/12/2018   Healthcare maintenance 06/26/2018   Rib fracture 01/04/2018   Chest pain 12/08/2017   Leukocytosis 12/08/2017   Essential hypertension 12/08/2017   Alcohol withdrawal ([place]) 07/21/2017   Lower urinary tract infectious disease 07/21/2017   Osteopenia 08/27/2016   Alcohol dependence with withdrawal ([place]) 01/27/2016   Generalized anxiety disorder 01/27/2016   Severe episode of recurrent major depressive disorder, without psychotic features ([place]) 01/27/2016   Major depressive disorder, recurrent episode, moderate (HCC)    Alcohol use disorder, severe, dependence ([place]) 04/03/2014   Lumbosacral radiculopathy at L5 05/20/2013   Nonspecific elevation of levels of transaminase or lactic acid dehydrogenase (LDH) 03/16/2013   Hepatic steatosis 03/16/2013   Nausea and vomiting 03/16/2013   Chronic cholecystitis 03/14/2013   Cervical arthritis 10/12/2012    Palpitations 11/29/2010   SVT (supraventricular tachycardia) ([place]) 11/29/2010   Mitral valve prolapse 11/29/2010   Decreased libido 11/15/2010   GOITER, MULTINODULAR 08/17/2009   ANXIETY 08/17/2009   Migraine headache 08/17/2009   Hearing loss 08/17/2009   Osteoporosis 08/17/2009   ASYMPTOMATIC POSTMENOPAUSAL STATUS 08/17/2009   DYSPHAGIA 06/19/2007   PCP:  Lorrene Reid, PA-C Pharmacy:   [facility], [place] [address] Phone: [phone] Fax: [phone]     Social Determinants of Health (SDOH) Interventions    Readmission Risk Interventions No flowsheet data found.

## 2021-02-13 NOTE — Progress Notes (Signed)
PROGRESS NOTE    Terri Ayala  KGM:[id number] DOB: 1955-07-22 DOA: 02/10/2021 PCP: Lorrene Reid, PA-C    Chief Complaint  Patient presents with   Altered Mental Status    Brief Narrative:   Terri Ayala is a 65 y.o. female with medical history significant for alcoholism, HTN, GERD, osteoporosis who presents by EMS after a fall at home.  She reports that she stopped drinking alcohol a week ago.  She has been drinking 2-3 bottles of wine a day she reports.  She states she has been having some confusion and more tremors than normal but she has been able to walk with the help of her walker.  She does report having pain in her left ankle after she twisted it and says "sprained it" last week.  Yesterday afternoon she fell at home and laid on the floor for a few hours.  Her mother tried to call her throughout the day and was unable to get in touch with her so called her neighbors who came and checked on her and found her on the floor.  She denies having any head trauma and did not have any loss of consciousness.  States she has not had any chest pain, palpitations nausea or vomiting.  She reports she has been admitted in the past for alcohol withdrawals. -Her work-up significant for left ankle fracture, left 12th rib fracture, and L1 vertebral fracture, alcohol abuse with withdrawal, and rhabdomyolysis, she was admitted for further management   Assessment & Plan:   Principal Problem:   Alcohol withdrawal ([place]) Active Problems:   Essential hypertension   Ankle fracture, left, closed, initial encounter   L1 vertebral fracture (HCC)   Thyroid nodule   Prolonged QT interval    Alcohol abuse with withdrawals -With known history of alcohol abuse, continue with CIWA protocol. -Continue with scheduled Librium -Continue with thiamine and folic acid. -She was counseled  Rhabdomyolysis -Total CK elevated at 662, trending down which is reassuring -Continue with IV fluids    Essential  hypertension -Blood pressure is acceptable, continue with current dose Coreg.   Ankle fracture, left, closed,  -Status post ORIF by orthopedic.   Left 12th rib fracture  - pulmonary toilet, IS, multimodal pain control regimen added, she was encouraged with incentive spirometer today  L1 vertebral fracture  Continue with pain control     Thyroid nodule -Ultrasound is done, she will need annual ultrasound surveillance until 5 years of stability is documented   Prolonged QT interval Avoid medications which could further prolong QT interval call abuse with withdrawals  Hypophosphatemia/hypokalemia -Significantly low, will monitor closely for refeeding syndrome.  DVT prophylaxis:  Code Status: Full Family Communication: Discussed with mother by phone Disposition: Recommendation for SNF placement  Status is: Inpatient  Remains inpatient appropriate because: fracture, trauma, and Rhabdo.       Consultants:  Orthopedic. Trauma surgery   Subjective:  Patient reports she is feeling better today, pain is controlled  Objective: Vitals:   02/12/21 2000 02/12/21 2342 02/13/21 0808 02/13/21 1214  BP: 114/73  (!) 142/84 (!) 152/83  Pulse: 91  100 100  Resp: (!) 23  19 16   Temp: 98.4 F (36.9 C) 98.2 F (36.8 C) 98.5 F (36.9 C) 98.5 F (36.9 C)  TempSrc: Oral Oral Oral Oral  SpO2: 96%  96% 98%    Intake/Output Summary (Last 24 hours) at 02/13/2021 1322 Last data filed at 02/13/2021 1216 Gross per 24 hour  Intake 1614.39 ml  Output  950 ml  Net 664.39 ml   There were no vitals filed for this visit.  Examination:  Awake Alert, Oriented X 3, frail.  Symmetrical Chest wall movement, Good air movement bilaterally, CTAB RRR,No Gallops,Rubs or new Murmurs, No Parasternal Heave +ve B.Sounds, Abd Soft, No tenderness, No rebound - guarding or rigidity. No Cyanosis, left lower extremity in a splint    Data Reviewed: I have personally reviewed following labs and  imaging studies  CBC: Recent Labs  Lab 02/10/21 2109 02/10/21 2218 02/11/21 0333 02/12/21 0106 02/13/21 0138  WBC 13.5*  --  13.4* 7.9 5.6  NEUTROABS 12.0*  --   --   --   --   HGB 12.1 13.9 11.9* 10.1* 9.3*  HCT 36.6 41.0 36.1 29.8* 27.3*  MCV 96.6  --  97.8 95.5 93.5  PLT 123*  --  90* 96* 116*    Basic Metabolic Panel: Recent Labs  Lab 02/10/21 2109 02/10/21 2218 02/11/21 0333 02/12/21 0106 02/13/21 0138  NA 136 138 139 137 136  K 3.6 3.8 4.5 3.9 2.8*  CL 101 105 107 106 104  CO2 15*  --  17* 18* 24  GLUCOSE 125* 123* 95 123* 90  BUN 14 16 11 11  5*  CREATININE 1.05* 0.60 0.95 0.81 0.52  CALCIUM 8.9  --  8.4* 7.9* 7.6*  MG  --   --   --  1.8  --   PHOS  --   --   --  2.1* 1.9*    GFR: CrCl cannot be calculated (Unknown ideal weight.).  Liver Function Tests: Recent Labs  Lab 02/10/21 2109  AST 50*  ALT 19  ALKPHOS 64  BILITOT 1.9*  PROT 7.9  ALBUMIN 4.3    CBG: No results for input(s): GLUCAP in the last 168 hours.   Recent Results (from the past 240 hour(s))  Resp Panel by RT-PCR (Flu A&B, Covid) Nasopharyngeal Swab     Status: None   Collection Time: 02/10/21 11:30 PM   Specimen: Nasopharyngeal Swab; Nasopharyngeal(NP) swabs in vial transport medium  Result Value Ref Range Status   SARS Coronavirus 2 by RT PCR NEGATIVE NEGATIVE Final    Comment: (NOTE) SARS-CoV-2 target nucleic acids are NOT DETECTED.  The SARS-CoV-2 RNA is generally detectable in upper respiratory specimens during the acute phase of infection. The lowest concentration of SARS-CoV-2 viral copies this assay can detect is 138 copies/mL. A negative result does not preclude SARS-Cov-2 infection and should not be used as the sole basis for treatment or other patient management decisions. A negative result may occur with  improper specimen collection/handling, submission of specimen other than nasopharyngeal swab, presence of viral mutation(s) within the areas targeted by this  assay, and inadequate number of viral copies(<138 copies/mL). A negative result must be combined with clinical observations, patient history, and epidemiological information. The expected result is Negative.  Fact Sheet for Patients:  EntrepreneurPulse.com.au  Fact Sheet for Healthcare Providers:  IncredibleEmployment.be  This test is no t yet approved or cleared by the [place] FDA and  has been authorized for detection and/or diagnosis of SARS-CoV-2 by FDA under an Emergency Use Authorization (EUA). This EUA will remain  in effect (meaning this test can be used) for the duration of the COVID-19 declaration under Section 564(b)(1) of the Act, 21 U.S.C.section 360bbb-3(b)(1), unless the authorization is terminated  or revoked sooner.       Influenza A by PCR NEGATIVE NEGATIVE Final   Influenza B by PCR NEGATIVE  NEGATIVE Final    Comment: (NOTE) The Xpert Xpress SARS-CoV-2/FLU/RSV plus assay is intended as an aid in the diagnosis of influenza from Nasopharyngeal swab specimens and should not be used as a sole basis for treatment. Nasal washings and aspirates are unacceptable for Xpert Xpress SARS-CoV-2/FLU/RSV testing.  Fact Sheet for Patients: EntrepreneurPulse.com.au  Fact Sheet for Healthcare Providers: IncredibleEmployment.be  This test is not yet approved or cleared by the [place] FDA and has been authorized for detection and/or diagnosis of SARS-CoV-2 by FDA under an Emergency Use Authorization (EUA). This EUA will remain in effect (meaning this test can be used) for the duration of the COVID-19 declaration under Section 564(b)(1) of the Act, 21 U.S.C. section 360bbb-3(b)(1), unless the authorization is terminated or revoked.  Performed at [facility], [place] [address] [place], [place] [address]   Surgical pcr screen     Status: None   Collection Time: 02/11/21 12:00 PM    Specimen: Nasal Mucosa; Nasal Swab  Result Value Ref Range Status   MRSA, PCR NEGATIVE NEGATIVE Final   Staphylococcus aureus NEGATIVE NEGATIVE Final    Comment: (NOTE) The Xpert SA Assay (FDA approved for NASAL specimens in patients 78 years of age and older), is one component of a comprehensive surveillance program. It is not intended to diagnose infection nor to guide or monitor treatment. Performed at [facility], [place] [address], [place], [place] [address]          Radiology Studies: DG Ankle Complete Left  Result Date: 02/11/2021 CLINICAL DATA:  Postop ankle fracture EXAM: LEFT ANKLE COMPLETE - 3+ VIEW COMPARISON:  02/10/2021 FINDINGS: Interval surgical plate and multiple screw fixation of the left fibula for distal fibular fracture. Status post surgical fixation of medial malleolar fracture. There is anatomic alignment. Reduction of previously noted lateral talar subluxation. IMPRESSION: Interval internal fixation of bimalleolar fractures with anatomic alignment. Electronically Signed   By: Donavan Foil M.D.   On: 02/11/2021 19:52   DG Ankle Complete Left  Result Date: 02/11/2021 CLINICAL DATA:  Surgical internal fixation of left ankle fracture. EXAM: LEFT ANKLE COMPLETE - 3+ VIEW; DG C-ARM 1-60 MIN-NO REPORT Radiation exposure index: 0.58 mGy. COMPARISON:  February 10, 2021. FINDINGS: Five intraoperative fluoroscopic images were obtained of the left ankle. These demonstrate the patient be status post surgical internal fixation of distal left fibular and tibial fractures. Good alignment of fracture components is noted. IMPRESSION: Fluoroscopic guidance provided during surgical internal fixation of distal left tibial and fibular fractures. Electronically Signed   By: Marijo Conception M.D.   On: 02/11/2021 17:15   DG C-Arm 1-60 Min-No Report  Result Date: 02/11/2021 CLINICAL DATA:  Surgical internal fixation of left ankle fracture. EXAM: LEFT ANKLE COMPLETE - 3+ VIEW; DG  C-ARM 1-60 MIN-NO REPORT Radiation exposure index: 0.58 mGy. COMPARISON:  February 10, 2021. FINDINGS: Five intraoperative fluoroscopic images were obtained of the left ankle. These demonstrate the patient be status post surgical internal fixation of distal left fibular and tibial fractures. Good alignment of fracture components is noted. IMPRESSION: Fluoroscopic guidance provided during surgical internal fixation of distal left tibial and fibular fractures. Electronically Signed   By: Marijo Conception M.D.   On: 02/11/2021 17:15   DG C-Arm 1-60 Min-No Report  Result Date: 02/11/2021 CLINICAL DATA:  Surgical internal fixation of left ankle fracture. EXAM: LEFT ANKLE COMPLETE - 3+ VIEW; DG C-ARM 1-60 MIN-NO REPORT Radiation exposure index: 0.58 mGy. COMPARISON:  February 10, 2021. FINDINGS:  Five intraoperative fluoroscopic images were obtained of the left ankle. These demonstrate the patient be status post surgical internal fixation of distal left fibular and tibial fractures. Good alignment of fracture components is noted. IMPRESSION: Fluoroscopic guidance provided during surgical internal fixation of distal left tibial and fibular fractures. Electronically Signed   By: Marijo Conception M.D.   On: 02/11/2021 17:15        Scheduled Meds:  acetaminophen  1,000 mg Oral Q6H   carvedilol  3.125 mg Oral BID WC   chlordiazePOXIDE  10 mg Oral TID   DULoxetine  60 mg Oral Daily   enoxaparin (LOVENOX) injection  40 mg Subcutaneous Daily   folic acid  1 mg Oral Daily   LORazepam  0-4 mg Intravenous Q8H   multivitamin with minerals  1 tablet Oral Daily   phosphorus  250 mg Oral TID   potassium chloride  40 mEq Oral Q6H   thiamine  100 mg Oral Daily   Or   thiamine  100 mg Intravenous Daily   Continuous Infusions:  lactated ringers 50 mL/hr at 02/12/21 1544   methocarbamol (ROBAXIN) IV     potassium PHOSPHATE IVPB (in mmol) 30 mmol (02/13/21 0942)     LOS: 3 days      Phillips Climes,  MD [facility]   To contact the attending provider between 7A-7P or the covering provider during after hours 7P-7A, please log into the web site [email or web address] and access using universal [place] password for that web site. If you do not have the password, please call the hospital operator.  02/13/2021, 1:22 PM

## 2021-02-13 NOTE — Plan of Care (Addendum)
Incontinence of bowel and bladder. Initiated q 2 hrs turns per Braden scale/protocol. No acute events overnight.   Problem: Education: Goal: Knowledge of General Education information will improve Description: Including pain rating scale, medication(s)/side effects and non-pharmacologic comfort measures Outcome: Progressing   Problem: Health Behavior/Discharge Planning: Goal: Ability to manage health-related needs will improve Outcome: Progressing   Problem: Clinical Measurements: Goal: Ability to maintain clinical measurements within normal limits will improve Outcome: Progressing Goal: Will remain free from infection Outcome: Progressing Goal: Diagnostic test results will improve Outcome: Progressing Goal: Respiratory complications will improve Outcome: Progressing Goal: Cardiovascular complication will be avoided Outcome: Progressing   Problem: Activity: Goal: Risk for activity intolerance will decrease Outcome: Progressing   Problem: Nutrition: Goal: Adequate nutrition will be maintained Outcome: Progressing   Problem: Coping: Goal: Level of anxiety will decrease Outcome: Progressing   Problem: Elimination: Goal: Will not experience complications related to bowel motility Outcome: Progressing Goal: Will not experience complications related to urinary retention Outcome: Progressing   Problem: Pain Managment: Goal: General experience of comfort will improve Outcome: Progressing   Problem: Safety: Goal: Ability to remain free from injury will improve Outcome: Progressing   Problem: Skin Integrity: Goal: Risk for impaired skin integrity will decrease Outcome: Progressing

## 2021-02-13 NOTE — TOC Progression Note (Cosign Needed)
Transition of Care [facility]) - Progression Note    Patient Details  Name: Terri Ayala MRN: [id number] Date of Birth: 1955-05-22  Transition of Care [facility] CM/SW [place], [place] Phone Number: 02/13/2021, 1:48 PM  Clinical Narrative:    RE: Terri Ayala Date of Birth: Dec 07, 1955 Date: 02/13/21  Please be advised that the above-named patient will require a short-term nursing home stay - anticipated 30 days or less for rehabilitation and strengthening.  The plan is for return home.    Expected Discharge Plan: [place] Barriers to Discharge: Continued Medical Work up, Ship broker  Expected Discharge Plan and Services Expected Discharge Plan: [place] In-house Referral: Clinical Social Work Discharge Planning Services: CM Consult   Living arrangements for the past 2 months: Single Family Home                                       Social Determinants of Health (SDOH) Interventions    Readmission Risk Interventions No flowsheet data found.

## 2021-02-13 NOTE — NC FL2 (Addendum)
[place] LEVEL OF CARE SCREENING TOOL     IDENTIFICATION  Patient Name: Terri Ayala Birthdate: Sep 08, 1955 Sex: female Admission Date (Current Location): 02/10/2021  [facility] [place] and [place] Number:  Herbalist and Address:  [facility] [place], [place] [address], [place], [place] [address]      Provider Number: [id number]  Attending Physician Name and Address:  Elgergawy, Silver Huguenin, MD  Relative Name and Phone Number:  Sharri Loya    Current Level of Care: Hospital Recommended Level of Care: [name] Prior Approval Number:    Date Approved/Denied:   PASRR Number: [id number] Expires 03/17/21  Discharge Plan: SNF    Current Diagnoses: Patient Active Problem List   Diagnosis Date Noted   Ankle fracture, left, closed, initial encounter 02/10/2021   L1 vertebral fracture ([place]) 02/10/2021   Thyroid nodule 02/10/2021   Prolonged QT interval 02/10/2021   Elevated cholesterol 10/19/2020   Pedal edema 10/19/2020   History of colonic polyps 03/25/2020   Hemorrhage of rectum and anus 03/25/2020   Gastroparesis 03/25/2020   Gastroesophageal reflux disease 03/25/2020   Flatulence, eructation and gas pain 03/25/2020   Family history of malignant neoplasm of gastrointestinal tract 81/44/8185   Eosinophilic esophagitis 63/14/9702   Constipation 03/25/2020   Change in bowel habit 03/25/2020   Hospital discharge follow-up 63/78/5885   Metabolic acidosis 02/77/4128   Abnormal liver function 02/06/2019   Alcohol withdrawal delirium ([place]) 02/06/2019   Elevated LFTs 07/12/2018   Healthcare maintenance 06/26/2018   Rib fracture 01/04/2018   Chest pain 12/08/2017   Leukocytosis 12/08/2017   Essential hypertension 12/08/2017   Alcohol withdrawal ([place]) 07/21/2017   Lower urinary tract infectious disease 07/21/2017   Osteopenia 08/27/2016   Alcohol dependence with withdrawal ([place]) 01/27/2016   Generalized anxiety disorder 01/27/2016    Severe episode of recurrent major depressive disorder, without psychotic features ([place]) 01/27/2016   Major depressive disorder, recurrent episode, moderate (HCC)    Alcohol use disorder, severe, dependence ([place]) 04/03/2014   Lumbosacral radiculopathy at L5 05/20/2013   Nonspecific elevation of levels of transaminase or lactic acid dehydrogenase (LDH) 03/16/2013   Hepatic steatosis 03/16/2013   Nausea and vomiting 03/16/2013   Chronic cholecystitis 03/14/2013   Cervical arthritis 10/12/2012   Palpitations 11/29/2010   SVT (supraventricular tachycardia) ([place]) 11/29/2010   Mitral valve prolapse 11/29/2010   Decreased libido 11/15/2010   GOITER, MULTINODULAR 08/17/2009   ANXIETY 08/17/2009   Migraine headache 08/17/2009   Hearing loss 08/17/2009   Osteoporosis 08/17/2009   ASYMPTOMATIC POSTMENOPAUSAL STATUS 08/17/2009   DYSPHAGIA 06/19/2007    Orientation RESPIRATION BLADDER Height & Weight     Self, Situation  Normal Incontinent, External catheter Weight:   Height:     BEHAVIORAL SYMPTOMS/MOOD NEUROLOGICAL BOWEL NUTRITION STATUS      Continent Diet (see dc summary)  AMBULATORY STATUS COMMUNICATION OF NEEDS Skin   Extensive Assist Verbally Surgical wounds (closed incision on ankle)                       Personal Care Assistance Level of Assistance  Bathing, Feeding, Dressing Bathing Assistance: Maximum assistance Feeding assistance: Independent Dressing Assistance: Limited assistance     Functional Limitations Info  Sight, Hearing, Speech Sight Info: Adequate Hearing Info: Adequate Speech Info: Adequate    SPECIAL CARE FACTORS FREQUENCY  PT (By licensed PT), OT (By licensed OT)     PT Frequency: 5x/week OT Frequency: 5x/week  Contractures Contractures Info: Not present    Additional Factors Info  Code Status, Allergies, Psychotropic Code Status Info: Full Allergies Info: Doxycycline, Codeine, Tetracycline Hcl Psychotropic Info: Cymbalta;  Ativan PRN         Current Medications (02/13/2021):  This is the current hospital active medication list Current Facility-Administered Medications  Medication Dose Route Frequency Provider Last Rate Last Admin   acetaminophen (TYLENOL) tablet 1,000 mg  1,000 mg Oral Q6H Ainsley Spinner, PA-C   1,000 mg at 02/13/21 0939   albuterol (PROVENTIL) (2.5 MG/3ML) 0.083% nebulizer solution 2.5 mg  2.5 mg Inhalation Q4H PRN Ainsley Spinner, PA-C       carvedilol (COREG) tablet 3.125 mg  3.125 mg Oral BID WC Ainsley Spinner, PA-C   3.125 mg at 02/13/21 2563   chlordiazePOXIDE (LIBRIUM) capsule 10 mg  10 mg Oral TID Ainsley Spinner, PA-C   10 mg at 02/13/21 8937   diphenhydrAMINE (BENADRYL) capsule 25 mg  25 mg Oral Q6H PRN Ainsley Spinner, PA-C       Or   diphenhydrAMINE (BENADRYL) injection 12.5 mg  12.5 mg Intravenous Q6H PRN Ainsley Spinner, PA-C       DULoxetine (CYMBALTA) DR capsule 60 mg  60 mg Oral Daily Ainsley Spinner, PA-C   60 mg at 02/13/21 3428   enoxaparin (LOVENOX) injection 40 mg  40 mg Subcutaneous Daily Ainsley Spinner, PA-C   40 mg at 76/81/15 7262   folic acid (FOLVITE) tablet 1 mg  1 mg Oral Daily Ainsley Spinner, PA-C   1 mg at 02/13/21 0355   lactated ringers infusion   Intravenous Continuous Elgergawy, Silver Huguenin, MD 50 mL/hr at 02/12/21 1544 Rate Change at 02/12/21 1544   LORazepam (ATIVAN) injection 0-4 mg  0-4 mg Intravenous Q8H Ainsley Spinner, PA-C   2 mg at 02/13/21 0940   LORazepam (ATIVAN) tablet 1-4 mg  1-4 mg Oral Q1H PRN Ainsley Spinner, PA-C       Or   LORazepam (ATIVAN) injection 1-4 mg  1-4 mg Intravenous Q1H PRN Ainsley Spinner, PA-C       methocarbamol (ROBAXIN) 500 mg in dextrose 5 % 50 mL IVPB  500 mg Intravenous Q8H PRN Ainsley Spinner, PA-C       morphine 2 MG/ML injection 1-2 mg  1-2 mg Intravenous Q3H PRN Ainsley Spinner, PA-C   2 mg at 02/12/21 1505   multivitamin with minerals tablet 1 tablet  1 tablet Oral Daily Ainsley Spinner, PA-C   1 tablet at 02/13/21 9741   oxyCODONE (Oxy IR/ROXICODONE) immediate release  tablet 5-10 mg  5-10 mg Oral Q4H PRN Ainsley Spinner, PA-C       phosphorus (K PHOS NEUTRAL) tablet 250 mg  250 mg Oral TID Elgergawy, Silver Huguenin, MD   250 mg at 02/13/21 6384   potassium chloride SA (KLOR-CON M) CR tablet 40 mEq  40 mEq Oral Q6H Elgergawy, Silver Huguenin, MD   40 mEq at 02/13/21 5364   potassium PHOSPHATE 30 mmol in dextrose 5 % 500 mL infusion  30 mmol Intravenous Once Albertine Patricia, MD 85 mL/hr at 02/13/21 0942 30 mmol at 02/13/21 0942   thiamine tablet 100 mg  100 mg Oral Daily Ainsley Spinner, PA-C   100 mg at 02/13/21 6803   Or   thiamine (B-1) injection 100 mg  100 mg Intravenous Daily Ainsley Spinner, PA-C         Discharge Medications: Please see discharge summary for a list of discharge medications.  Relevant Imaging Results:  Relevant Lab Results:   Additional Information SSn: [id number]. Moderna COVID-19 Vaccine 06/05/2020 , 12/20/2019 , 05/03/2019 , 04/05/2019  Loreta Ave, LCSWA

## 2021-02-14 LAB — CBC
HCT: 32.1 % — ABNORMAL LOW (ref 36.0–46.0)
Hemoglobin: 10.8 g/dL — ABNORMAL LOW (ref 12.0–15.0)
MCH: 31.7 pg (ref 26.0–34.0)
MCHC: 33.6 g/dL (ref 30.0–36.0)
MCV: 94.1 fL (ref 80.0–100.0)
Platelets: 155 10*3/uL (ref 150–400)
RBC: 3.41 MIL/uL — ABNORMAL LOW (ref 3.87–5.11)
RDW: 12.7 % (ref 11.5–15.5)
WBC: 3.7 10*3/uL — ABNORMAL LOW (ref 4.0–10.5)
nRBC: 0 % (ref 0.0–0.2)

## 2021-02-14 LAB — BASIC METABOLIC PANEL
Anion gap: 9 (ref 5–15)
BUN: <5 mg/dL — ABNORMAL LOW (ref 8–23)
CO2: 26 mmol/L (ref 22–32)
Calcium: 8.7 mg/dL — ABNORMAL LOW (ref 8.9–10.3)
Chloride: 102 mmol/L (ref 98–111)
Creatinine, Ser: 0.52 mg/dL (ref 0.44–1.00)
GFR, Estimated: >60 mL/min (ref >60)
Glucose, Bld: 112 mg/dL — ABNORMAL HIGH (ref 70–99)
Potassium: 3.4 mmol/L — ABNORMAL LOW (ref 3.5–5.1)
Sodium: 137 mmol/L (ref 135–145)

## 2021-02-14 LAB — PHOSPHORUS: Phosphorus: 5.2 mg/dL — ABNORMAL HIGH (ref 2.5–4.6)

## 2021-02-14 MED ORDER — CHLORDIAZEPOXIDE HCL 5 MG PO CAPS: 5.0000 mg | ORAL_CAPSULE | Freq: Two times a day (BID) | ORAL | Status: DC

## 2021-02-14 MED ORDER — CHLORDIAZEPOXIDE HCL 5 MG PO CAPS
5.0000 mg | ORAL_CAPSULE | Freq: Two times a day (BID) | ORAL | Status: DC
  Administered 2021-02-14 – 2021-02-15 (×2): 5 mg via ORAL
  Filled 2021-02-14 (×2): qty 1

## 2021-02-14 NOTE — Progress Notes (Signed)
PROGRESS NOTE    GATHA MCNULTY  ZOX:[id number] DOB: [birth year] DOA: 02/10/2021 PCP: Lorrene Reid, PA-C    Chief Complaint  Patient presents with   Altered Mental Status    Brief Narrative:   Terri Ayala is a 65 y.o. female with medical history significant for alcoholism, HTN, GERD, osteoporosis who presents by EMS after a fall at home.  She reports that she stopped drinking alcohol a week ago.  She has been drinking 2-3 bottles of wine a day she reports.  She states she has been having some confusion and more tremors than normal but she has been able to walk with the help of her walker.  She does report having pain in her left ankle after she twisted it and says "sprained it" last week.  Yesterday afternoon she fell at home and laid on the floor for a few hours.  Her mother tried to call her throughout the day and was unable to get in touch with her so called her neighbors who came and checked on her and found her on the floor.  She denies having any head trauma and did not have any loss of consciousness.  States she has not had any chest pain, palpitations nausea or vomiting.  She reports she has been admitted in the past for alcohol withdrawals. -Her work-up significant for left ankle fracture, left 12th rib fracture, and L1 vertebral fracture, alcohol abuse with withdrawal, and rhabdomyolysis, she was admitted for further management   Assessment & Plan:   Principal Problem:   Alcohol withdrawal ([place]) Active Problems:   Essential hypertension   Ankle fracture, left, closed, initial encounter   L1 vertebral fracture (HCC)   Thyroid nodule   Prolonged QT interval    Alcohol abuse with withdrawals -With known history of alcohol abuse, continue with CIWA protocol. -Continue with scheduled Librium, will continue to taper, will change to 5 mg twice daily x4 doses then discontinue. -Continue with thiamine and folic acid. -She was counseled  Rhabdomyolysis -Total CK elevated at  662, trending down which is reassuring -She was appropriately hydrated with IV fluids, currently she is with appropriate oral intake, will DC her IV fluids.    Essential hypertension -Blood pressure is acceptable, continue with current dose Coreg.   Ankle fracture, left, closed,  -Status post ORIF by orthopedic.   Left 12th rib fracture  - pulmonary toilet, IS, multimodal pain control regimen added, she was encouraged with incentive spirometer today  L1 vertebral fracture  Continue with pain control     Thyroid nodule -Ultrasound is done, she will need annual ultrasound surveillance until 5 years of stability is documented   Prolonged QT interval Avoid medications which could further prolong QT interval call abuse with withdrawals  Hypophosphatemia/hypokalemia -Significantly low, will monitor closely for refeeding syndrome.  DVT prophylaxis:  Code Status: Full Family Communication: Discussed with mother by phone 12/17. Disposition: Recommendation for SNF placement  Status is: Inpatient  Remains inpatient appropriate because: fracture, trauma, and Rhabdo.       Consultants:  Orthopedic. Trauma surgery   Subjective:  Significant events overnight as discussed with staff, patient denies any complaints.  Objective: Vitals:   02/14/21 0600 02/14/21 0804 02/14/21 1124 02/14/21 1212  BP: 140/77 (!) 152/99 (!) 159/99 (!) 152/95  Pulse:  91 91 99  Resp: 16 20 20 15   Temp:  98.2 F (36.8 C)  98.2 F (36.8 C)  TempSrc:  Oral Oral Oral  SpO2:  95%  100%  Weight:   59 kg   Height:   5\' 2"  (1.575 m)     Intake/Output Summary (Last 24 hours) at 02/14/2021 1323 Last data filed at 02/14/2021 0805 Gross per 24 hour  Intake 1718.57 ml  Output 1800 ml  Net -81.43 ml   Filed Weights   02/14/21 1124  Weight: 59 kg    Examination:  Awake Alert, Oriented X 3, No new F.N deficits, Normal affect Symmetrical Chest wall movement, Good air movement bilaterally,  CTAB RRR,No Gallops,Rubs or new Murmurs, No Parasternal Heave +ve B.Sounds, Abd Soft, No tenderness, No rebound - guarding or rigidity. No Cyanosis, Clubbing , left lower extremity in a splint     Data Reviewed: I have personally reviewed following labs and imaging studies  CBC: Recent Labs  Lab 02/10/21 2109 02/10/21 2218 02/11/21 0333 02/12/21 0106 02/13/21 0138 02/14/21 0650  WBC 13.5*  --  13.4* 7.9 5.6 3.7*  NEUTROABS 12.0*  --   --   --   --   --   HGB 12.1 13.9 11.9* 10.1* 9.3* 10.8*  HCT 36.6 41.0 36.1 29.8* 27.3* 32.1*  MCV 96.6  --  97.8 95.5 93.5 94.1  PLT 123*  --  90* 96* 116* 272    Basic Metabolic Panel: Recent Labs  Lab 02/10/21 2109 02/10/21 2218 02/11/21 0333 02/12/21 0106 02/13/21 0138 02/14/21 0231  NA 136 138 139 137 136 137  K 3.6 3.8 4.5 3.9 2.8* 3.4*  CL 101 105 107 106 104 102  CO2 15*  --  17* 18* 24 26  GLUCOSE 125* 123* 95 123* 90 112*  BUN 14 16 11 11  5* <5*  CREATININE 1.05* 0.60 0.95 0.81 0.52 0.52  CALCIUM 8.9  --  8.4* 7.9* 7.6* 8.7*  MG  --   --   --  1.8  --   --   PHOS  --   --   --  2.1* 1.9* 5.2*    GFR: Estimated Creatinine Clearance: 54.7 mL/min (by C-G formula based on SCr of 0.52 mg/dL).  Liver Function Tests: Recent Labs  Lab 02/10/21 2109  AST 50*  ALT 19  ALKPHOS 64  BILITOT 1.9*  PROT 7.9  ALBUMIN 4.3    CBG: No results for input(s): GLUCAP in the last 168 hours.   Recent Results (from the past 240 hour(s))  Resp Panel by RT-PCR (Flu A&B, Covid) Nasopharyngeal Swab     Status: None   Collection Time: 02/10/21 11:30 PM   Specimen: Nasopharyngeal Swab; Nasopharyngeal(NP) swabs in vial transport medium  Result Value Ref Range Status   SARS Coronavirus 2 by RT PCR NEGATIVE NEGATIVE Final    Comment: (NOTE) SARS-CoV-2 target nucleic acids are NOT DETECTED.  The SARS-CoV-2 RNA is generally detectable in upper respiratory specimens during the acute phase of infection. The lowest concentration of  SARS-CoV-2 viral copies this assay can detect is 138 copies/mL. A negative result does not preclude SARS-Cov-2 infection and should not be used as the sole basis for treatment or other patient management decisions. A negative result may occur with  improper specimen collection/handling, submission of specimen other than nasopharyngeal swab, presence of viral mutation(s) within the areas targeted by this assay, and inadequate number of viral copies(<138 copies/mL). A negative result must be combined with clinical observations, patient history, and epidemiological information. The expected result is Negative.  Fact Sheet for Patients:  EntrepreneurPulse.com.au  Fact Sheet for Healthcare Providers:  IncredibleEmployment.be  This test is no t yet approved  or cleared by the [place] and  has been authorized for detection and/or diagnosis of SARS-CoV-2 by FDA under an Emergency Use Authorization (EUA). This EUA will remain  in effect (meaning this test can be used) for the duration of the COVID-19 declaration under Section 564(b)(1) of the Act, 21 U.S.C.section 360bbb-3(b)(1), unless the authorization is terminated  or revoked sooner.       Influenza A by PCR NEGATIVE NEGATIVE Final   Influenza B by PCR NEGATIVE NEGATIVE Final    Comment: (NOTE) The Xpert Xpress SARS-CoV-2/FLU/RSV plus assay is intended as an aid in the diagnosis of influenza from Nasopharyngeal swab specimens and should not be used as a sole basis for treatment. Nasal washings and aspirates are unacceptable for Xpert Xpress SARS-CoV-2/FLU/RSV testing.  Fact Sheet for Patients: EntrepreneurPulse.com.au  Fact Sheet for Healthcare Providers: IncredibleEmployment.be  This test is not yet approved or cleared by the [place] FDA and has been authorized for detection and/or diagnosis of SARS-CoV-2 by FDA under an Emergency Use  Authorization (EUA). This EUA will remain in effect (meaning this test can be used) for the duration of the COVID-19 declaration under Section 564(b)(1) of the Act, 21 U.S.C. section 360bbb-3(b)(1), unless the authorization is terminated or revoked.  Performed at [facility], [place] [address] [place], [place] [address]   Surgical pcr screen     Status: None   Collection Time: 02/11/21 12:00 PM   Specimen: Nasal Mucosa; Nasal Swab  Result Value Ref Range Status   MRSA, PCR NEGATIVE NEGATIVE Final   Staphylococcus aureus NEGATIVE NEGATIVE Final    Comment: (NOTE) The Xpert SA Assay (FDA approved for NASAL specimens in patients 47 years of age and older), is one component of a comprehensive surveillance program. It is not intended to diagnose infection nor to guide or monitor treatment. Performed at [facility], [place] [address], [place], [place] [address]          Radiology Studies: No results found.      Scheduled Meds:  acetaminophen  1,000 mg Oral Q6H   carvedilol  3.125 mg Oral BID WC   chlordiazePOXIDE  10 mg Oral TID   DULoxetine  60 mg Oral Daily   enoxaparin (LOVENOX) injection  40 mg Subcutaneous Daily   folic acid  1 mg Oral Daily   LORazepam  0-4 mg Intravenous Q8H   multivitamin with minerals  1 tablet Oral Daily   thiamine  100 mg Oral Daily   Or   thiamine  100 mg Intravenous Daily   Continuous Infusions:  methocarbamol (ROBAXIN) IV       LOS: 4 days      Phillips Climes, MD [facility]   To contact the attending provider between 7A-7P or the covering provider during after hours 7P-7A, please log into the web site [email or web address] and access using universal [place] password for that web site. If you do not have the password, please call the hospital operator.  02/14/2021, 1:23 PM

## 2021-02-15 MED ORDER — CHLORDIAZEPOXIDE HCL 5 MG PO CAPS
10.0000 mg | ORAL_CAPSULE | Freq: Three times a day (TID) | ORAL | Status: DC
  Administered 2021-02-15 – 2021-02-16 (×3): 10 mg via ORAL
  Filled 2021-02-15 (×3): qty 2

## 2021-02-15 MED ORDER — TRAZODONE HCL 50 MG PO TABS
50.0000 mg | ORAL_TABLET | Freq: Every day | ORAL | Status: DC
  Administered 2021-02-15: 23:00:00 50 mg via ORAL
  Filled 2021-02-15: qty 1

## 2021-02-15 MED ORDER — CHLORDIAZEPOXIDE HCL 5 MG PO CAPS: 10.0000 mg | ORAL_CAPSULE | Freq: Two times a day (BID) | ORAL | Status: DC

## 2021-02-15 NOTE — Progress Notes (Signed)
Orthopedic Tech Progress Note Patient Details:  Terri Ayala 03-08-1955 [id number]  Spoke with PA Eddie Dibbles this morning regarding  patient splint, he asked me to loosen up ace on patient, cut off old ace and applied fresh ACE to PA SPLINT   Ortho Devices Type of Ortho Device: Ace wrap Ortho Device/Splint Location: LLE Ortho Device/Splint Interventions: Ordered, Application, Removal   Post Interventions Patient Tolerated: Well Instructions Provided: Care of device  Janit Pagan 02/15/2021, 3:20 PM

## 2021-02-15 NOTE — Progress Notes (Signed)
PROGRESS NOTE    Terri Ayala  IRC:[id number] DOB: 11-Feb-1956 DOA: 02/10/2021 PCP: Lorrene Reid, PA-C    Chief Complaint  Patient presents with   Altered Mental Status    Brief Narrative:   Terri Ayala is a 65 y.o. female with medical history significant for alcoholism, HTN, GERD, osteoporosis who presents by EMS after a fall at home.  She reports that she stopped drinking alcohol a week ago.  She has been drinking 2-3 bottles of wine a day she reports.  She states she has been having some confusion and more tremors than normal but she has been able to walk with the help of her walker.  She does report having pain in her left ankle after she twisted it and says "sprained it" last week.  Yesterday afternoon she fell at home and laid on the floor for a few hours.  Her mother tried to call her throughout the day and was unable to get in touch with her so called her neighbors who came and checked on her and found her on the floor.  She denies having any head trauma and did not have any loss of consciousness.  States she has not had any chest pain, palpitations nausea or vomiting.  She reports she has been admitted in the past for alcohol withdrawals. -Her work-up significant for left ankle fracture, left 12th rib fracture, and L1 vertebral fracture, alcohol abuse with withdrawal, and rhabdomyolysis, she was admitted for further management   Assessment & Plan:   Principal Problem:   Alcohol withdrawal ([place]) Active Problems:   Essential hypertension   Ankle fracture, left, closed, initial encounter   L1 vertebral fracture (HCC)   Thyroid nodule   Prolonged QT interval    Alcohol abuse with withdrawals -With known history of alcohol abuse, was started on CIWA protocol, and Librium taper, she is currently finished her CIWA protocol, will continue with Librium taper for now. -Continue with thiamine and folic acid. -She was counseled  Rhabdomyolysis -Total CK elevated at 662, trending  down which is reassuring -She was appropriately hydrated with IV fluids, currently she is with appropriate oral intake, will DC her IV fluids.    Essential hypertension -Blood pressure is acceptable, continue with current dose Coreg.   Ankle fracture, left, closed,  -Status post ORIF by orthopedic.   Left 12th rib fracture  - pulmonary toilet, IS, multimodal pain control regimen added, she was encouraged with incentive spirometer today  L1 vertebral fracture  Continue with pain control     Thyroid nodule -Ultrasound is done, she will need annual ultrasound surveillance until 5 years of stability is documented   Prolonged QT interval Avoid medications which could further prolong QT interval call abuse with withdrawals  Hypophosphatemia/hypokalemia -Repleted, recheck level in AM.  Does report some insomnia so I will start her on trazodone.  DVT prophylaxis:  Code Status: Full Family Communication: Discussed with mother by phone 12/17. Disposition: Recommendation for SNF placement  Status is: Inpatient  Remains inpatient appropriate because: Patient is stable for discharge once bed is available.  Reports left ankle pain, insomnia and some anxiety       Consultants:  Orthopedic. Trauma surgery   Subjective:  Significant events overnight as discussed with staff, patient denies any complaints.  Objective: Vitals:   02/15/21 0015 02/15/21 0443 02/15/21 0743 02/15/21 1202  BP: 125/77 (!) 145/99 (!) 141/98 139/85  Pulse: 81 98 95 99  Resp: 18 16 12 17   Temp: 97.6 F (  36.4 C) 98.4 F (36.9 C) 98.3 F (36.8 C) 98.6 F (37 C)  TempSrc: Oral Oral Oral Oral  SpO2:   98% 97%  Weight:      Height:        Intake/Output Summary (Last 24 hours) at 02/15/2021 1511 Last data filed at 02/15/2021 0443 Gross per 24 hour  Intake 424.27 ml  Output 1250 ml  Net -825.73 ml   Filed Weights   02/14/21 1124  Weight: 59 kg    Examination:  Awake Alert, Oriented X 3, No  new F.N deficits, Normal affect Symmetrical Chest wall movement, Good air movement bilaterally, CTAB RRR,No Gallops,Rubs or new Murmurs, No Parasternal Heave +ve B.Sounds, Abd Soft, No tenderness, No rebound - guarding or rigidity. No Cyanosis, Clubbing , left lower extremity in a splint     Data Reviewed: I have personally reviewed following labs and imaging studies  CBC: Recent Labs  Lab 02/10/21 2109 02/10/21 2218 02/11/21 0333 02/12/21 0106 02/13/21 0138 02/14/21 0650  WBC 13.5*  --  13.4* 7.9 5.6 3.7*  NEUTROABS 12.0*  --   --   --   --   --   HGB 12.1 13.9 11.9* 10.1* 9.3* 10.8*  HCT 36.6 41.0 36.1 29.8* 27.3* 32.1*  MCV 96.6  --  97.8 95.5 93.5 94.1  PLT 123*  --  90* 96* 116* 226    Basic Metabolic Panel: Recent Labs  Lab 02/10/21 2109 02/10/21 2218 02/11/21 0333 02/12/21 0106 02/13/21 0138 02/14/21 0231  NA 136 138 139 137 136 137  K 3.6 3.8 4.5 3.9 2.8* 3.4*  CL 101 105 107 106 104 102  CO2 15*  --  17* 18* 24 26  GLUCOSE 125* 123* 95 123* 90 112*  BUN 14 16 11 11  5* <5*  CREATININE 1.05* 0.60 0.95 0.81 0.52 0.52  CALCIUM 8.9  --  8.4* 7.9* 7.6* 8.7*  MG  --   --   --  1.8  --   --   PHOS  --   --   --  2.1* 1.9* 5.2*    GFR: Estimated Creatinine Clearance: 54.7 mL/min (by C-G formula based on SCr of 0.52 mg/dL).  Liver Function Tests: Recent Labs  Lab 02/10/21 2109  AST 50*  ALT 19  ALKPHOS 64  BILITOT 1.9*  PROT 7.9  ALBUMIN 4.3    CBG: No results for input(s): GLUCAP in the last 168 hours.   Recent Results (from the past 240 hour(s))  Resp Panel by RT-PCR (Flu A&B, Covid) Nasopharyngeal Swab     Status: None   Collection Time: 02/10/21 11:30 PM   Specimen: Nasopharyngeal Swab; Nasopharyngeal(NP) swabs in vial transport medium  Result Value Ref Range Status   SARS Coronavirus 2 by RT PCR NEGATIVE NEGATIVE Final    Comment: (NOTE) SARS-CoV-2 target nucleic acids are NOT DETECTED.  The SARS-CoV-2 RNA is generally detectable in  upper respiratory specimens during the acute phase of infection. The lowest concentration of SARS-CoV-2 viral copies this assay can detect is 138 copies/mL. A negative result does not preclude SARS-Cov-2 infection and should not be used as the sole basis for treatment or other patient management decisions. A negative result may occur with  improper specimen collection/handling, submission of specimen other than nasopharyngeal swab, presence of viral mutation(s) within the areas targeted by this assay, and inadequate number of viral copies(<138 copies/mL). A negative result must be combined with clinical observations, patient history, and epidemiological information. The expected result is Negative.  Fact Sheet for Patients:  EntrepreneurPulse.com.au  Fact Sheet for Healthcare Providers:  IncredibleEmployment.be  This test is no t yet approved or cleared by the [place] FDA and  has been authorized for detection and/or diagnosis of SARS-CoV-2 by FDA under an Emergency Use Authorization (EUA). This EUA will remain  in effect (meaning this test can be used) for the duration of the COVID-19 declaration under Section 564(b)(1) of the Act, 21 U.S.C.section 360bbb-3(b)(1), unless the authorization is terminated  or revoked sooner.       Influenza A by PCR NEGATIVE NEGATIVE Final   Influenza B by PCR NEGATIVE NEGATIVE Final    Comment: (NOTE) The Xpert Xpress SARS-CoV-2/FLU/RSV plus assay is intended as an aid in the diagnosis of influenza from Nasopharyngeal swab specimens and should not be used as a sole basis for treatment. Nasal washings and aspirates are unacceptable for Xpert Xpress SARS-CoV-2/FLU/RSV testing.  Fact Sheet for Patients: EntrepreneurPulse.com.au  Fact Sheet for Healthcare Providers: IncredibleEmployment.be  This test is not yet approved or cleared by the [place] FDA and has been  authorized for detection and/or diagnosis of SARS-CoV-2 by FDA under an Emergency Use Authorization (EUA). This EUA will remain in effect (meaning this test can be used) for the duration of the COVID-19 declaration under Section 564(b)(1) of the Act, 21 U.S.C. section 360bbb-3(b)(1), unless the authorization is terminated or revoked.  Performed at [facility], [place] [address] [place], [place] [address]   Surgical pcr screen     Status: None   Collection Time: 02/11/21 12:00 PM   Specimen: Nasal Mucosa; Nasal Swab  Result Value Ref Range Status   MRSA, PCR NEGATIVE NEGATIVE Final   Staphylococcus aureus NEGATIVE NEGATIVE Final    Comment: (NOTE) The Xpert SA Assay (FDA approved for NASAL specimens in patients 67 years of age and older), is one component of a comprehensive surveillance program. It is not intended to diagnose infection nor to guide or monitor treatment. Performed at [facility], [place] [address], [place], [place] [address]          Radiology Studies: No results found.      Scheduled Meds:  acetaminophen  1,000 mg Oral Q6H   carvedilol  3.125 mg Oral BID WC   chlordiazePOXIDE  10 mg Oral TID   DULoxetine  60 mg Oral Daily   enoxaparin (LOVENOX) injection  40 mg Subcutaneous Daily   folic acid  1 mg Oral Daily   multivitamin with minerals  1 tablet Oral Daily   thiamine  100 mg Oral Daily   Or   thiamine  100 mg Intravenous Daily   Continuous Infusions:  methocarbamol (ROBAXIN) IV       LOS: 5 days      Phillips Climes, MD [facility]   To contact the attending provider between 7A-7P or the covering provider during after hours 7P-7A, please log into the web site [email or web address] and access using universal [place] password for that web site. If you do not have the password, please call the hospital operator.  02/15/2021, 3:11 PM

## 2021-02-15 NOTE — TOC Progression Note (Addendum)
Transition of Care [facility]) - Progression Note    Patient Details  Name: Terri Ayala MRN: [id number] Date of Birth: 1955/11/03  Transition of [facility]) CM/SW [place], LCSW Phone Number: 02/15/2021, 1:07 PM  Clinical Narrative:    CSW presented patient with bed offer of [facility]. She accepts and stated it is near her house. She reported she does not have clothes here and needs to pay her bills. CSW asked if her mother could ship her some things to wear and she stated she would ask her. CSW wrote down address of [facility] for her to provide her mother and list of substance use resources. CSW will contact APS and ask if they can assist her with accessing her bills from SNF.      Expected Discharge Plan: [place] Barriers to Discharge: Continued Medical Work up, Ship broker  Expected Discharge Plan and Services Expected Discharge Plan: [place] In-house Referral: Clinical Social Work Discharge Planning Services: CM Consult   Living arrangements for the past 2 months: Single Family Home                                       Social Determinants of Health (SDOH) Interventions    Readmission Risk Interventions No flowsheet data found.

## 2021-02-15 NOTE — Plan of Care (Signed)
  Problem: Education: Goal: Knowledge of General Education information will improve Description Including pain rating scale, medication(s)/side effects and non-pharmacologic comfort measures Outcome: Progressing   Problem: Health Behavior/Discharge Planning: Goal: Ability to manage health-related needs will improve Outcome: Progressing   

## 2021-02-16 DIAGNOSIS — T148XXA Other injury of unspecified body region, initial encounter: Secondary | ICD-10-CM

## 2021-02-16 LAB — BASIC METABOLIC PANEL
Anion gap: 13 (ref 5–15)
BUN: 8 mg/dL (ref 8–23)
CO2: 25 mmol/L (ref 22–32)
Calcium: 9.2 mg/dL (ref 8.9–10.3)
Chloride: 96 mmol/L — ABNORMAL LOW (ref 98–111)
Creatinine, Ser: 0.63 mg/dL (ref 0.44–1.00)
GFR, Estimated: >60 mL/min (ref >60)
Glucose, Bld: 102 mg/dL — ABNORMAL HIGH (ref 70–99)
Potassium: 2.9 mmol/L — ABNORMAL LOW (ref 3.5–5.1)
Sodium: 134 mmol/L — ABNORMAL LOW (ref 135–145)

## 2021-02-16 LAB — CBC
HCT: 31.4 % — ABNORMAL LOW (ref 36.0–46.0)
Hemoglobin: 10.9 g/dL — ABNORMAL LOW (ref 12.0–15.0)
MCH: 32.5 pg (ref 26.0–34.0)
MCHC: 34.7 g/dL (ref 30.0–36.0)
MCV: 93.7 fL (ref 80.0–100.0)
Platelets: 190 10*3/uL (ref 150–400)
RBC: 3.35 MIL/uL — ABNORMAL LOW (ref 3.87–5.11)
RDW: 13.2 % (ref 11.5–15.5)
WBC: 4.7 10*3/uL (ref 4.0–10.5)
nRBC: 0 % (ref 0.0–0.2)

## 2021-02-16 LAB — SARS CORONAVIRUS 2 (TAT 6-24 HRS): SARS Coronavirus 2: NEGATIVE

## 2021-02-16 LAB — PHOSPHORUS: Phosphorus: 4.7 mg/dL — ABNORMAL HIGH (ref 2.5–4.6)

## 2021-02-16 MED ORDER — THIAMINE HCL 100 MG PO TABS: 100.0000 mg | ORAL_TABLET | Freq: Every day | ORAL | Status: DC

## 2021-02-16 MED ORDER — FLUCONAZOLE 150 MG PO TABS
150.0000 mg | ORAL_TABLET | Freq: Once | ORAL | Status: AC
  Administered 2021-02-16: 13:00:00 150 mg via ORAL
  Filled 2021-02-16 (×2): qty 1

## 2021-02-16 MED ORDER — POTASSIUM CHLORIDE 10 MEQ/100ML IV SOLN
10.0000 meq | INTRAVENOUS | Status: AC
  Administered 2021-02-16: 11:00:00 10 meq via INTRAVENOUS
  Filled 2021-02-16 (×2): qty 100

## 2021-02-16 MED ORDER — CHLORDIAZEPOXIDE HCL 10 MG PO CAPS: ORAL_CAPSULE | ORAL | 0 refills | Status: DC

## 2021-02-16 MED ORDER — OXYCODONE HCL 5 MG PO TABS: 5.0000 mg | ORAL_TABLET | Freq: Four times a day (QID) | ORAL | 0 refills | Status: DC | PRN

## 2021-02-16 MED ORDER — ENOXAPARIN SODIUM 40 MG/0.4ML IJ SOSY: 40.0000 mg | PREFILLED_SYRINGE | Freq: Every day | INTRAMUSCULAR | Status: DC

## 2021-02-16 MED ORDER — POTASSIUM CHLORIDE CRYS ER 20 MEQ PO TBCR
40.0000 meq | EXTENDED_RELEASE_TABLET | Freq: Once | ORAL | Status: AC
  Administered 2021-02-16: 05:00:00 40 meq via ORAL
  Filled 2021-02-16: qty 2

## 2021-02-16 MED ORDER — POTASSIUM CHLORIDE CRYS ER 20 MEQ PO TBCR
40.0000 meq | EXTENDED_RELEASE_TABLET | ORAL | Status: AC
  Administered 2021-02-16 (×2): 40 meq via ORAL
  Filled 2021-02-16 (×2): qty 2

## 2021-02-16 MED ORDER — CARVEDILOL 3.125 MG PO TABS: 3.1250 mg | ORAL_TABLET | Freq: Two times a day (BID) | ORAL | Status: DC

## 2021-02-16 MED ORDER — POTASSIUM CHLORIDE CRYS ER 10 MEQ PO TBCR: 20.0000 meq | EXTENDED_RELEASE_TABLET | Freq: Every day | ORAL | 0 refills | Status: DC

## 2021-02-16 NOTE — Progress Notes (Signed)
Orthopaedic Trauma Service Progress Note  Patient ID: Terri Ayala MRN: [id number] DOB/AGE: 1955/07/14 65 y.o.  Subjective:  Sitting up in chair  No complaints   Pain as expected. Increased pain likely from block completely wearing off Did feel some improvement with loosening of ace wrapa  ROS As above  Objective:   VITALS:   Vitals:   02/15/21 1616 02/15/21 2106 02/16/21 0039 02/16/21 0746  BP: (!) 142/94 (!) 148/90 (!) 101/98 119/79  Pulse: 89 90 85 95  Resp: 12 12 14 17   Temp: 98.5 F (36.9 C) 98.1 F (36.7 C) 98.2 F (36.8 C) 98.5 F (36.9 C)  TempSrc: Oral Oral Oral Oral  SpO2: 97% 98%  92%  Weight:      Height:        Estimated body mass index is 23.78 kg/m as calculated from the following:   Height as of this encounter: 5\' 2"  (1.575 m).   Weight as of this encounter: 59 kg.   Intake/Output      12/19 0701 12/20 0700 12/20 0701 12/21 0700   I.V. (mL/kg)     Total Intake(mL/kg)     Urine (mL/kg/hr) 1 (0)    Total Output 1    Net -1         Stool Occurrence 1 x      LABS  Results for orders placed or performed during the hospital encounter of 02/10/21 (from the past 24 hour(s))  SARS CORONAVIRUS 2 (TAT 6-24 HRS) Nasopharyngeal Nasopharyngeal Swab     Status: None   Collection Time: 02/15/21 12:02 PM   Specimen: Nasopharyngeal Swab  Result Value Ref Range   SARS Coronavirus 2 NEGATIVE NEGATIVE  Basic metabolic panel     Status: Abnormal   Collection Time: 02/16/21 12:44 AM  Result Value Ref Range   Sodium 134 (L) 135 - 145 mmol/L   Potassium 2.9 (L) 3.5 - 5.1 mmol/L   Chloride 96 (L) 98 - 111 mmol/L   CO2 25 22 - 32 mmol/L   Glucose, Bld 102 (H) 70 - 99 mg/dL   BUN 8 8 - 23 mg/dL   Creatinine, Ser 0.63 0.44 - 1.00 mg/dL   Calcium 9.2 8.9 - 10.3 mg/dL   GFR, Estimated >60 >60 mL/min   Anion gap 13 5 - 15  CBC     Status: Abnormal   Collection Time: 02/16/21 12:44 AM   Result Value Ref Range   WBC 4.7 4.0 - 10.5 K/uL   RBC 3.35 (L) 3.87 - 5.11 MIL/uL   Hemoglobin 10.9 (L) 12.0 - 15.0 g/dL   HCT 31.4 (L) 36.0 - 46.0 %   MCV 93.7 80.0 - 100.0 fL   MCH 32.5 26.0 - 34.0 pg   MCHC 34.7 30.0 - 36.0 g/dL   RDW 13.2 11.5 - 15.5 %   Platelets 190 150 - 400 K/uL   nRBC 0.0 0.0 - 0.2 %  Phosphorus     Status: Abnormal   Collection Time: 02/16/21 12:44 AM  Result Value Ref Range   Phosphorus 4.7 (H) 2.5 - 4.6 mg/dL     PHYSICAL EXAM:   Gen: sitting up in chair, NAD Ext:       Left Lower Extremity              SLS clean,  dry and intact. Fitting well             Ext warm             No significant swelling              + DP pulse             EHL, FHL, lesser toe motor intact  DPN, SPN, TN sensation intact      Assessment/Plan: 5 Days Post-Op    Anti-infectives (From admission, onward)    Start     Dose/Rate Route Frequency Ordered Stop   02/16/21 1045  fluconazole (DIFLUCAN) tablet 150 mg        150 mg Oral  Once 02/16/21 0953     02/11/21 2200  ceFAZolin (ANCEF) IVPB 2g/100 mL premix        2 g 200 mL/hr over 30 Minutes Intravenous Every 8 hours 02/11/21 1920 02/13/21 1922   02/11/21 1147  ceFAZolin (ANCEF) 2-4 GM/100ML-% IVPB       Note to Pharmacy: Humberto Leep O: cabinet override      02/11/21 1147 02/11/21 2359     .  POD/HD#: 42  65 y/o female, ground level fall with L ankle fracture    - L ankle fracture s/p ORIF, bimalleolar ankle fracture with unstable syndesmosis)             NWB L leg x 6-8 weeks             Knee and toe ROM as tolerated              Ice and elevate for swelling and pain control             PT/OT               Splint x 2 weeks then CAM so pt can begin ankle ROM    - Pain management:             Multimodal    - ABL anemia/Hemodynamics             Stable   - Medical issues              Per primary    - DVT/PE prophylaxis:             On prophylactic lovenox             Could xarelto 10 mg  daily x 21 days at dc    - ID:              Periop abx   - Metabolic Bone Disease:             This is a fragility fracture given mechanism             Likely multifactorial due to age/postmenopausal sate as well as alcohol abuse                          Vitamin d levels look good               Needs DEXA as outpt    - Activity:             NWB L leg o/w as tolerated    - Impediments to fracture healing:             Age             Etoh  use   - Dispo:             Ortho issues stable             Follow up with ortho in 10-14 days          Jari Pigg, PA-C [phone] (C) 02/16/2021, 11:02 AM  [facility] [place] [address] [phone] Jenetta [phone] (F)    After 5pm and on the weekends please log on to Amion, go to orthopaedics and the look under the [facility] Call for the provider(s) on call. You can also call our office at [phone] and then follow the prompts to be connected to the call team.   Patient ID: Terri Ayala, female   DOB: 09-Sep-1955, 65 y.o.   MRN: [id number]

## 2021-02-16 NOTE — Progress Notes (Signed)
Upon reviewing pt's morning labs, noted a potassium level of 2.9 Notified on call provider Jennette Kettle DO, awaiting a response . Pt asymptomatic. Will continue to monitor.

## 2021-02-16 NOTE — Plan of Care (Signed)

## 2021-02-16 NOTE — Progress Notes (Signed)
Occupational Therapy Treatment Patient Details Name: Terri Ayala MRN: [id number] DOB: Dec 13, 1955 Today's Date: 02/16/2021   History of present illness 65 yo admitted 12/14 after found on floor at home with fall. Pt with left ankle fx s/p ORIF, Lt 12th rib fx and Lt L1 TVP fx. PMhx: ETOH abuse, chronic LBP, depression, anxiety   OT comments  Patient received in recliner and agreeable to OT treatment.  Patient began performing grooming when she began having complaints of pain on LUE at IV site. Nurse was called and IV removed. Patient continued to have pain and completed grooming.  Patient performed transfer to EOB from recliner with mod assist to stand to RW and max assist to pivot to maintain NWB on LLE.  Patient stated pain was lessening once supine. Patient is expected to discharge from hospital later today.    Recommendations for follow up therapy are one component of a multi-disciplinary discharge planning process, led by the attending physician.  Recommendations may be updated based on patient status, additional functional criteria and insurance authorization.    Follow Up Recommendations  Skilled nursing-short term rehab (<3 hours/day)    Assistance Recommended at Discharge Frequent or constant Supervision/Assistance  Equipment Recommendations  Other (comment) (TBD)    Recommendations for Other Services      Precautions / Restrictions Precautions Precautions: Fall Restrictions Weight Bearing Restrictions: Yes LLE Weight Bearing: Non weight bearing       Mobility Bed Mobility Overal bed mobility: Needs Assistance Bed Mobility: Sit to Supine       Sit to supine: Min assist   General bed mobility comments: assistance with LLE    Transfers Overall transfer level: Needs assistance Equipment used: Rolling walker (2 wheels) Transfers: Sit to/from Stand Sit to Stand: Mod assist Stand pivot transfers: Max assist         General transfer comment: mod assist to stand  and max assist with transfers using RW due to LUE pain     Balance Overall balance assessment: Needs assistance Sitting-balance support: No upper extremity supported;Feet supported Sitting balance-Leahy Scale: Fair Sitting balance - Comments: sitting in recliner upon entry, able to sit on EOB without assistance   Standing balance support: Bilateral upper extremity supported;Reliant on assistive device for balance;During functional activity Standing balance-Leahy Scale: Poor Standing balance comment: mod assist due to RUE pain                           ADL either performed or assessed with clinical judgement   ADL Overall ADL's : Needs assistance/impaired     Grooming: Wash/dry hands;Wash/dry face;Set up;Sitting Grooming Details (indicate cue type and reason): performed sitting in recliner                               General ADL Comments: only able to tolerate grooming seated in recliner due to arm pain    Extremity/Trunk Assessment              Vision       Perception     Praxis      Cognition Arousal/Alertness: Awake/alert Behavior During Therapy: Flat affect Overall Cognitive Status: Impaired/Different from baseline Area of Impairment: Problem solving                             Problem Solving: Slow processing;Requires verbal cues;Requires tactile cues;Difficulty  sequencing General Comments: frequent cues to maintain NWB on LLE          Exercises     Shoulder Instructions       General Comments      Pertinent Vitals/ Pain       Pain Assessment: Faces Faces Pain Scale: Hurts even more Pain Location: IV side, left arm left ankle Pain Descriptors / Indicators: Burning;Aching;Grimacing Pain Intervention(s): Limited activity within patient's tolerance;Monitored during session;Repositioned  Home Living                                          Prior Functioning/Environment               Frequency  Min 2X/week        Progress Toward Goals  OT Goals(current goals can now be found in the care plan section)  Progress towards OT goals: Progressing toward goals  Acute Rehab OT Goals Patient Stated Goal: get stronger OT Goal Formulation: With patient Time For Goal Achievement: 02/26/21 ADL Goals Pt Will Perform Grooming: with supervision;sitting Pt Will Perform Upper Body Bathing: with supervision;with set-up;sitting Pt Will Perform Lower Body Bathing: with mod assist;with min assist;sitting/lateral leans Pt Will Perform Upper Body Dressing: with supervision;with set-up;sitting Pt Will Transfer to Toilet: with mod assist;with min assist;stand pivot transfer;anterior/posterior transfer;bedside commode  Plan Discharge plan remains appropriate    Co-evaluation                 AM-PAC OT "6 Clicks" Daily Activity     Outcome Measure   Help from another person eating meals?: None Help from another person taking care of personal grooming?: A Little Help from another person toileting, which includes using toliet, bedpan, or urinal?: Total Help from another person bathing (including washing, rinsing, drying)?: A Lot Help from another person to put on and taking off regular upper body clothing?: A Little Help from another person to put on and taking off regular lower body clothing?: Total 6 Click Score: 14    End of Session Equipment Utilized During Treatment: Gait belt;Rolling walker (2 wheels)  OT Visit Diagnosis: Unsteadiness on feet (R26.81);Other abnormalities of gait and mobility (R26.89);History of falling (Z91.81);Muscle weakness (generalized) (M62.81);Other symptoms and signs involving cognitive function;Pain Pain - Right/Left: Left Pain - part of body: Leg;Ankle and joints of foot   Activity Tolerance Patient limited by pain   Patient Left in bed;with call bell/phone within reach;with bed alarm set   Nurse Communication Mobility status         Time: 5465-0354 OT Time Calculation (min): 29 min  Charges: OT General Charges $OT Visit: 1 Visit OT Treatments $Self Care/Home Management : 8-22 mins $Therapeutic Activity: 8-22 mins  Lodema Hong, [place]  Pager [phone] Office [phone]   Trixie Dredge 02/16/2021, 2:37 PM

## 2021-02-16 NOTE — Discharge Summary (Addendum)
Physician Discharge Summary  CHANI GHANEM ZOX:[id number] DOB: 1955/05/19 DOA: 02/10/2021  PCP: Lorrene Reid, PA-C  Admit date: 02/10/2021 Discharge date: 02/16/2021  Admitted From: Home Disposition:  SNF   Recommendations for Outpatient Follow-up:  Follow up with SNF physician  patient to continue with Librium taper over next 7 days when to stop Please obtain BMP/CBC in 3 days Continue with Lovenox for DVT prophylaxis for another 18 days To follow with orthopedics Dr. Marcelino Scot in 10 days keep encouraging to use incentive spirometer Patient will need annual thyroid ultrasound surveillance until 5 years of stability is documented  Recommendation per orthopedic regarding left ankle fracture - L ankle fracture s/p ORIF, bimalleolar ankle fracture with unstable syndesmosis)             NWB L leg x 6-8 weeks             Knee and toe ROM as tolerated              Ice and elevate for swelling and pain control             PT/OT             Splint x 2 weeks then CAM so pt can begin ankle ROM      Discharge Condition:Stable CODE STATUS:FULL Diet recommendation: Heart Healthy /   Brief/Interim Summary:  ILMA ACHEE is a 65 y.o. female with medical history significant for alcoholism, HTN, GERD, osteoporosis who presents by EMS after a fall at home.  She reports that she stopped drinking alcohol a week ago.  She has been drinking 2-3 bottles of wine a day she reports.  She states she has been having some confusion and more tremors than normal but she has been able to walk with the help of her walker.  She does report having pain in her left ankle after she twisted it and says "sprained it" last week.  Yesterday afternoon she fell at home and laid on the floor for a few hours.  Her mother tried to call her throughout the day and was unable to get in touch with her so called her neighbors who came and checked on her and found her on the floor.  She denies having any head trauma and did not have  any loss of consciousness.  States she has not had any chest pain, palpitations nausea or vomiting.  She reports she has been admitted in the past for alcohol withdrawals. -Her work-up significant for left ankle fracture, left 12th rib fracture, and L1 vertebral fracture, alcohol abuse with withdrawal, and rhabdomyolysis, she was admitted for further management    Discharge Diagnoses:  Principal Problem:   Alcohol withdrawal ([place]) Active Problems:   Essential hypertension   Ankle fracture, left, closed, initial encounter   L1 vertebral fracture ([facility])   Thyroid nodule   Prolonged QT interval    Alcohol abuse with withdrawals -With known history of alcohol abuse, was started on CIWA protocol, and Librium taper, she is off her CIWA protocol, she remains on Librium taper, to continue as an outpatient.  -Continue with thiamine and folic acid. -She was counseled   Rhabdomyolysis -Total CK elevated at 662, trending down which is reassuring -She was appropriately hydrated with IV fluids, currently she is with appropriate oral intake.    Essential hypertension -Blood pressure is acceptable, continue with current dose Coreg.   Ankle fracture, left, closed,  -Status post ORIF by orthopedic.  Follow-up with Dr. Marcelino Scot in  10 days from discharge, continue with as needed oxycodone for pain    Left 12th rib fracture  -Courage to his incentive spirometer.   L1 vertebral fracture  Continue with pain control     Thyroid nodule -Ultrasound is done, she will need annual ultrasound surveillance until 5 years of stability is documented    Prolonged QT interval Avoid medications which could further prolong QT    Hypophosphatemia/hypokalemia -Phosphorus was repleted, as well her potassium was repleted, potassium is 2.9 today, but she will receive 10 M EQ IV, and 40 p.o. x2 , she is to continue on low-dose potassium supplement for next 5 days.  Discharge Instructions  Discharge Instructions      Diet - low sodium heart healthy   Complete by: As directed    Increase activity slowly   Complete by: As directed    No wound care   Complete by: As directed       Allergies as of 02/16/2021       Reactions   Doxycycline Swelling   Mouth swelling and sores   Codeine Itching   Tetracycline Hcl Swelling   Mouth swelling and sores        Medication List     STOP taking these medications    diazepam 5 MG tablet Commonly known as: Valium   Jinteli 1-5 MG-MCG Tabs tablet Generic drug: norethindrone-ethinyl estradiol   meloxicam 15 MG tablet Commonly known as: MOBIC   propranolol 10 MG tablet Commonly known as: INDERAL       TAKE these medications    acetaminophen 650 MG CR tablet Commonly known as: TYLENOL Take 650 mg by mouth every 8 (eight) hours as needed for pain.   albuterol 108 (90 Base) MCG/ACT inhaler Commonly known as: VENTOLIN HFA Inhale 2 puffs into the lungs every 6 (six) hours as needed for wheezing or shortness of breath.   alendronate 70 MG tablet Commonly known as: FOSAMAX Take 70 mg by mouth every Monday. Take with a full glass of water on an empty stomach.   busPIRone 15 MG tablet Commonly known as: BUSPAR Take 15 mg by mouth 2 (two) times daily.   CALCIUM-D PO Take 1 tablet by mouth every morning.   carvedilol 3.125 MG tablet Commonly known as: COREG Take 1 tablet (3.125 mg total) by mouth 2 (two) times daily with a meal.   chlordiazePOXIDE 10 MG capsule Commonly known as: LIBRIUM Please use 10 mg oral 3 times daily for 1 day, then decrease to 10 mg oral twice daily for 3 days, then 10 mg oral daily for 3 days then stop.   DULoxetine 60 MG capsule Commonly known as: CYMBALTA TAKE 1 CAPSULE (60 MG TOTAL) BY MOUTH DAILY. What changed: when to take this   enoxaparin 40 MG/0.4ML injection Commonly known as: LOVENOX Inject 0.4 mLs (40 mg total) into the skin daily for 18 days. Give for 18 days then stop. Start taking on: February 18, 1659   folic acid 1 MG tablet Commonly known as: FOLVITE Take 1 tablet (1 mg total) by mouth daily.   gabapentin 100 MG capsule Commonly known as: Neurontin Take 1 capsule (100 mg total) by mouth 3 (three) times daily.   hydrocortisone 2.5 % cream Apply 1 application topically 2 (two) times daily as needed (itching).   hydrOXYzine 25 MG tablet Commonly known as: ATARAX Take 25 mg by mouth 2 (two) times daily.   multivitamin with minerals Tabs tablet Take 1 tablet by mouth  daily.   oxyCODONE 5 MG immediate release tablet Commonly known as: Oxy IR/ROXICODONE Take 1 tablet (5 mg total) by mouth every 6 (six) hours as needed for moderate pain or severe pain.   pantoprazole 40 MG tablet Commonly known as: PROTONIX TAKE 1 TABLET BY MOUTH 2 TIMES DAILY. What changed: when to take this   potassium chloride 10 MEQ tablet Commonly known as: KLOR-CON M Take 2 tablets (20 mEq total) by mouth daily for 5 days. Please take for 5 days then stop.   SUMAtriptan 100 MG tablet Commonly known as: IMITREX TAKE 1 TABLET BY MOUTH AT THE FIRST SIGN OF MIGRAINE, MAY REPEAT IN 2 HOURS. MAX 2 TABLETS IN 24 HOURS What changed: See the new instructions.   thiamine 100 MG tablet Take 1 tablet (100 mg total) by mouth daily. Start taking on: February 17, 2021   traZODone 50 MG tablet Commonly known as: DESYREL Take 50 mg by mouth at bedtime as needed for sleep.   triamcinolone cream 0.1 % Commonly known as: KENALOG Apply 1 application topically 2 (two) times daily as needed (facial itching).   VITAMIN D-3 PO Take 1 tablet by mouth every morning.        Contact information for follow-up providers     Altamese Atoka, MD. Schedule an appointment as soon as possible for a visit in 10 day(s).   Specialty: Orthopedic Surgery Contact information: [place] [address] [phone]              Contact information for after-discharge care     Destination      [facility] Preferred SNF .   Service: Skilled Nursing Contact information: [address] [address] [phone]                    Allergies  Allergen Reactions   Doxycycline Swelling    Mouth swelling and sores   Codeine Itching   Tetracycline Hcl Swelling    Mouth swelling and sores    Consultations: orthopedic   Procedures/Studies: DG Ankle Complete Left  Result Date: 02/11/2021 CLINICAL DATA:  Postop ankle fracture EXAM: LEFT ANKLE COMPLETE - 3+ VIEW COMPARISON:  02/10/2021 FINDINGS: Interval surgical plate and multiple screw fixation of the left fibula for distal fibular fracture. Status post surgical fixation of medial malleolar fracture. There is anatomic alignment. Reduction of previously noted lateral talar subluxation. IMPRESSION: Interval internal fixation of bimalleolar fractures with anatomic alignment. Electronically Signed   By: Donavan Foil M.D.   On: 02/11/2021 19:52   DG Ankle Complete Left  Result Date: 02/11/2021 CLINICAL DATA:  Surgical internal fixation of left ankle fracture. EXAM: LEFT ANKLE COMPLETE - 3+ VIEW; DG C-ARM 1-60 MIN-NO REPORT Radiation exposure index: 0.58 mGy. COMPARISON:  February 10, 2021. FINDINGS: Five intraoperative fluoroscopic images were obtained of the left ankle. These demonstrate the patient be status post surgical internal fixation of distal left fibular and tibial fractures. Good alignment of fracture components is noted. IMPRESSION: Fluoroscopic guidance provided during surgical internal fixation of distal left tibial and fibular fractures. Electronically Signed   By: Marijo Conception M.D.   On: 02/11/2021 17:15   CT HEAD WO CONTRAST (5MM)  Result Date: 02/10/2021 CLINICAL DATA:  Head trauma found lying on floor EXAM: CT HEAD WITHOUT CONTRAST CT CERVICAL SPINE WITHOUT CONTRAST TECHNIQUE: Multidetector CT imaging of the head and cervical spine was performed following the standard  protocol without intravenous contrast. Multiplanar CT image  reconstructions of the cervical spine were also generated. COMPARISON:  CT brain and cervical spine 01/30/2018 FINDINGS: CT HEAD FINDINGS Brain: No acute territorial infarction, hemorrhage or intracranial mass. Stable left cerebellar calcification. Moderate atrophy. Mild chronic small vessel ischemic changes of the white matter. Stable ventricle size Vascular: No hyperdense vessels.  No unexpected calcification Skull: Normal. Negative for fracture or focal lesion. Sinuses/Orbits: No acute finding. Other: None CT CERVICAL SPINE FINDINGS Alignment: No subluxation.  Facet alignment within normal limits Skull base and vertebrae: No acute fracture. No primary bone lesion or focal pathologic process. Soft tissues and spinal canal: No prevertebral fluid or swelling. No visible canal hematoma. Disc levels: Multilevel degenerative change. Moderate disc space narrowing C4-C5. Advanced degenerative changes C6-C7. Hypertrophic facet degenerative changes at multiple levels. Upper chest: Lung apices are clear. 1.9 cm hypodense nodule right lobe of thyroid and 17 mm hypodense nodule left lobe of thyroid. Other: None IMPRESSION: 1. No CT evidence for acute intracranial abnormality. Stable atrophy. 2. Degenerative changes of the cervical spine. No acute osseous abnormality 3. 1.9 cm hypodense nodule right lobe of thyroid. Recommend thyroid US (ref: J Am Coll Radiol. 2015 Feb;12(2): 143-50).This may be performed non emergently Electronically Signed   By: Donavan Foil M.D.   On: 02/10/2021 22:39   CT Cervical Spine Wo Contrast  Result Date: 02/10/2021 CLINICAL DATA:  Head trauma found lying on floor EXAM: CT HEAD WITHOUT CONTRAST CT CERVICAL SPINE WITHOUT CONTRAST TECHNIQUE: Multidetector CT imaging of the head and cervical spine was performed following the standard protocol without intravenous contrast. Multiplanar CT image reconstructions of the cervical spine were  also generated. COMPARISON:  CT brain and cervical spine 01/30/2018 FINDINGS: CT HEAD FINDINGS Brain: No acute territorial infarction, hemorrhage or intracranial mass. Stable left cerebellar calcification. Moderate atrophy. Mild chronic small vessel ischemic changes of the white matter. Stable ventricle size Vascular: No hyperdense vessels.  No unexpected calcification Skull: Normal. Negative for fracture or focal lesion. Sinuses/Orbits: No acute finding. Other: None CT CERVICAL SPINE FINDINGS Alignment: No subluxation.  Facet alignment within normal limits Skull base and vertebrae: No acute fracture. No primary bone lesion or focal pathologic process. Soft tissues and spinal canal: No prevertebral fluid or swelling. No visible canal hematoma. Disc levels: Multilevel degenerative change. Moderate disc space narrowing C4-C5. Advanced degenerative changes C6-C7. Hypertrophic facet degenerative changes at multiple levels. Upper chest: Lung apices are clear. 1.9 cm hypodense nodule right lobe of thyroid and 17 mm hypodense nodule left lobe of thyroid. Other: None IMPRESSION: 1. No CT evidence for acute intracranial abnormality. Stable atrophy. 2. Degenerative changes of the cervical spine. No acute osseous abnormality 3. 1.9 cm hypodense nodule right lobe of thyroid. Recommend thyroid US (ref: J Am Coll Radiol. 2015 Feb;12(2): 143-50).This may be performed non emergently Electronically Signed   By: Donavan Foil M.D.   On: 02/10/2021 22:39   CT Lumbar Spine Wo Contrast  Result Date: 02/10/2021 CLINICAL DATA:  Found on floor EXAM: CT LUMBAR SPINE WITHOUT CONTRAST TECHNIQUE: Multidetector CT imaging of the lumbar spine was performed without intravenous contrast administration. Multiplanar CT image reconstructions were also generated. COMPARISON:  CT 09/26/2020 FINDINGS: Segmentation: 5 lumbar type vertebrae. Alignment: Normal. Vertebrae: Vertebral body heights appear normal. Acute appearing left transverse process  fracture at L1. Paraspinal and other soft tissues: No paravertebral or paraspinal soft tissue abnormality is evident. Aortic atherosclerosis. Acute mildly displaced left twelfth posteromedial rib fracture. Disc levels: At T12-L1, maintained disc space. No canal stenosis. Foramen are patent. At  L1-L2, maintained disc space. No canal stenosis. The foramen are patent. At L2-L3, mild disc space narrowing. No canal stenosis. The foramen are patent bilaterally. At L3-L4, maintained disc space. Left foraminal and lateral disc protrusion. No significant foraminal narrowing. No canal stenosis. Mild facet degenerative changes. At L4-L5, maintained disc space. No canal stenosis. Mild facet degenerative changes. The foramen are patent bilaterally. At L5-S1, mild to moderate disc space narrowing. Posterior disc osteophyte complex. No canal stenosis. Facet degenerative changes. The foramen are patent bilaterally. IMPRESSION: 1. Acute appearing mildly displaced left transverse process fracture at L1. Adjacent acute mildly displaced left twelfth posteromedial rib fracture 2. Minimal degenerative changes. Electronically Signed   By: Donavan Foil M.D.   On: 02/10/2021 22:46   DG Pelvis Portable  Result Date: 02/10/2021 CLINICAL DATA:  Fall EXAM: PORTABLE PELVIS 1-2 VIEWS COMPARISON:  None. FINDINGS: Hip joints and SI joints are symmetric. No acute bony abnormality. Specifically, no fracture, subluxation, or dislocation. IMPRESSION: Negative. Electronically Signed   By: Rolm Baptise M.D.   On: 02/10/2021 21:40   DG Chest Port 1 View  Result Date: 02/10/2021 CLINICAL DATA:  Found on floor. EXAM: PORTABLE CHEST 1 VIEW COMPARISON:  11/20/2020 FINDINGS: Heart and mediastinal contours are within normal limits. No focal opacities or effusions. No acute bony abnormality. IMPRESSION: No active disease. Electronically Signed   By: Rolm Baptise M.D.   On: 02/10/2021 21:39   DG Ankle Left Port  Result Date: 02/10/2021 CLINICAL  DATA:  Found on floor EXAM: PORTABLE LEFT ANKLE - 2 VIEW COMPARISON:  None. FINDINGS: Bimalleolar fracture with oblique fracture through the distal fibular metaphysis and transverse fracture through the medial malleolus. Widening of the medial ankle mortise. No dislocation. Diffuse soft tissue swelling. IMPRESSION: Bimalleolar fracture with widening of the medial ankle mortise. Electronically Signed   By: Rolm Baptise M.D.   On: 02/10/2021 21:40   DG C-Arm 1-60 Min-No Report  Result Date: 02/11/2021 CLINICAL DATA:  Surgical internal fixation of left ankle fracture. EXAM: LEFT ANKLE COMPLETE - 3+ VIEW; DG C-ARM 1-60 MIN-NO REPORT Radiation exposure index: 0.58 mGy. COMPARISON:  February 10, 2021. FINDINGS: Five intraoperative fluoroscopic images were obtained of the left ankle. These demonstrate the patient be status post surgical internal fixation of distal left fibular and tibial fractures. Good alignment of fracture components is noted. IMPRESSION: Fluoroscopic guidance provided during surgical internal fixation of distal left tibial and fibular fractures. Electronically Signed   By: Marijo Conception M.D.   On: 02/11/2021 17:15   DG C-Arm 1-60 Min-No Report  Result Date: 02/11/2021 CLINICAL DATA:  Surgical internal fixation of left ankle fracture. EXAM: LEFT ANKLE COMPLETE - 3+ VIEW; DG C-ARM 1-60 MIN-NO REPORT Radiation exposure index: 0.58 mGy. COMPARISON:  February 10, 2021. FINDINGS: Five intraoperative fluoroscopic images were obtained of the left ankle. These demonstrate the patient be status post surgical internal fixation of distal left fibular and tibial fractures. Good alignment of fracture components is noted. IMPRESSION: Fluoroscopic guidance provided during surgical internal fixation of distal left tibial and fibular fractures. Electronically Signed   By: Marijo Conception M.D.   On: 02/11/2021 17:15   US THYROID  Result Date: 02/11/2021 CLINICAL DATA:  Single thyroid nodule EXAM: THYROID  ULTRASOUND TECHNIQUE: Ultrasound examination of the thyroid gland and adjacent soft tissues was performed. COMPARISON:  11/21/2012 FINDINGS: Parenchymal Echotexture: Mildly heterogeneous Isthmus: 0.3 cm Right lobe: 3.9 x 2.4 x 1.6 cm Left lobe: 3.2 x 1.5 x 1.3 cm _________________________________________________________ Estimated total  number of nodules >/= 1 cm: 2 Number of spongiform nodules >/=  2 cm not described below (TR1): 0 Number of mixed cystic and solid nodules >/= 1.5 cm not described below ([place]): 0 _________________________________________________________ Nodule # 1: Location: Right; inferior Maximum size: 1.9 cm; Other 2 dimensions: 1.6 x 1.6 cm Composition: solid/almost completely solid (2) Echogenicity: isoechoic (1) Shape: not taller-than-wide (0) Margins: smooth (0) Echogenic foci: none (0) ACR TI-RADS total points: 3. ACR TI-RADS risk category: TR3 (3 points). ACR TI-RADS recommendations: *Given size (>/= 1.5 - 2.4 cm) and appearance, a follow-up ultrasound in 1 year should be considered based on TI-RADS criteria. _________________________________________________________ Nodule # 2: Prior biopsy: No Location: Left; inferior Maximum size: 1.7 cm; Other 2 dimensions: 1.3 x 0.9 cm, previously, 1.3 x 1.1 x 1.1 cm Composition: solid/almost completely solid (2) Echogenicity: isoechoic (1) Shape: not taller-than-wide (0) Margins: smooth (0) Echogenic foci: none (0) ACR TI-RADS total points: 3. ACR TI-RADS risk category:  TR3 (3 points). Significant change in size (>/= 20% in two dimensions and minimal increase of 2 mm): No Change in features: No Change in ACR TI-RADS risk category: No ACR TI-RADS recommendations: Given stability since 2014, findings are consistent with a benign etiology. _________________________________________________________ IMPRESSION: 1. Nodule 1 (TI-RADS 3) is new compared to prior thyroid ultrasound. It meets criteria for imaging follow-up. Annual ultrasound surveillance is  recommended until 5 years of stability is documented. 2. Nodule 2 is not significantly changed in size since 2014 which indicates a benign etiology. The above is in keeping with the ACR TI-RADS recommendations - J Am Coll Radiol 2017;14:587-595. Electronically Signed   By: Miachel Roux M.D.   On: 02/11/2021 07:43     Subjective: Left ankle pain is controlled with current regimen, no nausea, no vomiting, no tremors  Discharge Exam: Vitals:   02/16/21 0746 02/16/21 1151  BP: 119/79 130/89  Pulse: 95 95  Resp: 17 12  Temp: 98.5 F (36.9 C) 98.3 F (36.8 C)  SpO2: 92%    Vitals:   02/15/21 2106 02/16/21 0039 02/16/21 0746 02/16/21 1151  BP: (!) 148/90 (!) 101/98 119/79 130/89  Pulse: 90 85 95 95  Resp: 12 14 17 12   Temp: 98.1 F (36.7 C) 98.2 F (36.8 C) 98.5 F (36.9 C) 98.3 F (36.8 C)  TempSrc: Oral Oral Oral Oral  SpO2: 98%  92%   Weight:      Height:        General: Pt is alert, awake, not in acute distress Cardiovascular: RRR, S1/S2 +, no rubs, no gallops Respiratory: CTA bilaterally, no wheezing, no rhonchi Abdominal: Soft, NT, ND, bowel sounds + Extremities: no edema, no cyanosis, left lower extremity in a splint    The results of significant diagnostics from this hospitalization (including imaging, microbiology, ancillary and laboratory) are listed below for reference.     Microbiology: Recent Results (from the past 240 hour(s))  Resp Panel by RT-PCR (Flu A&B, Covid) Nasopharyngeal Swab     Status: None   Collection Time: 02/10/21 11:30 PM   Specimen: Nasopharyngeal Swab; Nasopharyngeal(NP) swabs in vial transport medium  Result Value Ref Range Status   SARS Coronavirus 2 by RT PCR NEGATIVE NEGATIVE Final    Comment: (NOTE) SARS-CoV-2 target nucleic acids are NOT DETECTED.  The SARS-CoV-2 RNA is generally detectable in upper respiratory specimens during the acute phase of infection. The lowest concentration of SARS-CoV-2 viral copies this assay can detect  is 138 copies/mL. A negative result does not preclude SARS-Cov-2  infection and should not be used as the sole basis for treatment or other patient management decisions. A negative result may occur with  improper specimen collection/handling, submission of specimen other than nasopharyngeal swab, presence of viral mutation(s) within the areas targeted by this assay, and inadequate number of viral copies(<138 copies/mL). A negative result must be combined with clinical observations, patient history, and epidemiological information. The expected result is Negative.  Fact Sheet for Patients:  EntrepreneurPulse.com.au  Fact Sheet for Healthcare Providers:  IncredibleEmployment.be  This test is no t yet approved or cleared by the [place] FDA and  has been authorized for detection and/or diagnosis of SARS-CoV-2 by FDA under an Emergency Use Authorization (EUA). This EUA will remain  in effect (meaning this test can be used) for the duration of the COVID-19 declaration under Section 564(b)(1) of the Act, 21 U.S.C.section 360bbb-3(b)(1), unless the authorization is terminated  or revoked sooner.       Influenza A by PCR NEGATIVE NEGATIVE Final   Influenza B by PCR NEGATIVE NEGATIVE Final    Comment: (NOTE) The Xpert Xpress SARS-CoV-2/FLU/RSV plus assay is intended as an aid in the diagnosis of influenza from Nasopharyngeal swab specimens and should not be used as a sole basis for treatment. Nasal washings and aspirates are unacceptable for Xpert Xpress SARS-CoV-2/FLU/RSV testing.  Fact Sheet for Patients: EntrepreneurPulse.com.au  Fact Sheet for Healthcare Providers: IncredibleEmployment.be  This test is not yet approved or cleared by the [place] FDA and has been authorized for detection and/or diagnosis of SARS-CoV-2 by FDA under an Emergency Use Authorization (EUA). This EUA will remain in effect  (meaning this test can be used) for the duration of the COVID-19 declaration under Section 564(b)(1) of the Act, 21 U.S.C. section 360bbb-3(b)(1), unless the authorization is terminated or revoked.  Performed at [facility], [place] [address] [place], [place] [address]   Surgical pcr screen     Status: None   Collection Time: 02/11/21 12:00 PM   Specimen: Nasal Mucosa; Nasal Swab  Result Value Ref Range Status   MRSA, PCR NEGATIVE NEGATIVE Final   Staphylococcus aureus NEGATIVE NEGATIVE Final    Comment: (NOTE) The Xpert SA Assay (FDA approved for NASAL specimens in patients 32 years of age and older), is one component of a comprehensive surveillance program. It is not intended to diagnose infection nor to guide or monitor treatment. Performed at [facility], [place] [address] [place], [place] [address]   SARS CORONAVIRUS 2 (TAT 6-24 HRS) Nasopharyngeal Nasopharyngeal Swab     Status: None   Collection Time: 02/15/21 12:02 PM   Specimen: Nasopharyngeal Swab  Result Value Ref Range Status   SARS Coronavirus 2 NEGATIVE NEGATIVE Final    Comment: (NOTE) SARS-CoV-2 target nucleic acids are NOT DETECTED.  The SARS-CoV-2 RNA is generally detectable in upper and lower respiratory specimens during the acute phase of infection. Negative results do not preclude SARS-CoV-2 infection, do not rule out co-infections with other pathogens, and should not be used as the sole basis for treatment or other patient management decisions. Negative results must be combined with clinical observations, patient history, and epidemiological information. The expected result is Negative.  Fact Sheet for Patients: SugarRoll.be  Fact Sheet for Healthcare Providers: https://www.woods-mathews.com/  This test is not yet approved or cleared by the [place] FDA and  has been authorized for detection and/or diagnosis of SARS-CoV-2 by FDA under an  Emergency Use Authorization (EUA). This EUA will remain  in  effect (meaning this test can be used) for the duration of the COVID-19 declaration under Se ction 564(b)(1) of the Act, 21 U.S.C. section 360bbb-3(b)(1), unless the authorization is terminated or revoked sooner.  Performed at [facility], [place] [address], [place], [place] [address]      Labs: BNP (last 3 results) No results for input(s): BNP in the last 8760 hours. Basic Metabolic Panel: Recent Labs  Lab 02/11/21 0333 02/12/21 0106 02/13/21 0138 02/14/21 0231 02/16/21 0044  NA 139 137 136 137 134*  K 4.5 3.9 2.8* 3.4* 2.9*  CL 107 106 104 102 96*  CO2 17* 18* 24 26 25   GLUCOSE 95 123* 90 112* 102*  BUN 11 11 5* <5* 8  CREATININE 0.95 0.81 0.52 0.52 0.63  CALCIUM 8.4* 7.9* 7.6* 8.7* 9.2  MG  --  1.8  --   --   --   PHOS  --  2.1* 1.9* 5.2* 4.7*   Liver Function Tests: Recent Labs  Lab 02/10/21 2109  AST 50*  ALT 19  ALKPHOS 64  BILITOT 1.9*  PROT 7.9  ALBUMIN 4.3   No results for input(s): LIPASE, AMYLASE in the last 168 hours. No results for input(s): AMMONIA in the last 168 hours. CBC: Recent Labs  Lab 02/10/21 2109 02/10/21 2218 02/11/21 0333 02/12/21 0106 02/13/21 0138 02/14/21 0650 02/16/21 0044  WBC 13.5*  --  13.4* 7.9 5.6 3.7* 4.7  NEUTROABS 12.0*  --   --   --   --   --   --   HGB 12.1   < > 11.9* 10.1* 9.3* 10.8* 10.9*  HCT 36.6   < > 36.1 29.8* 27.3* 32.1* 31.4*  MCV 96.6  --  97.8 95.5 93.5 94.1 93.7  PLT 123*  --  90* 96* 116* 155 190   < > = values in this interval not displayed.   Cardiac Enzymes: Recent Labs  Lab 02/10/21 2109 02/13/21 0138  CKTOTAL 662* 433*   BNP: Invalid input(s): POCBNP CBG: No results for input(s): GLUCAP in the last 168 hours. D-Dimer No results for input(s): DDIMER in the last 72 hours. Hgb A1c No results for input(s): HGBA1C in the last 72 hours. Lipid Profile No results for input(s): CHOL, HDL, LDLCALC, TRIG, CHOLHDL, LDLDIRECT in  the last 72 hours. Thyroid function studies No results for input(s): TSH, T4TOTAL, T3FREE, THYROIDAB in the last 72 hours.  Invalid input(s): FREET3 Anemia work up No results for input(s): VITAMINB12, FOLATE, FERRITIN, TIBC, IRON, RETICCTPCT in the last 72 hours. Urinalysis    Component Value Date/Time   COLORURINE YELLOW 09/26/2020 1730   APPEARANCEUR CLEAR 09/26/2020 1730   LABSPEC 1.039 (H) 09/26/2020 1730   LABSPEC 1.030 09/01/2017 1443   PHURINE 5.0 09/26/2020 1730   GLUCOSEU NEGATIVE 09/26/2020 1730   HGBUR SMALL (A) 09/26/2020 1730   BILIRUBINUR NEGATIVE 09/26/2020 1730   BILIRUBINUR neg 08/28/2019 1612   KETONESUR 20 (A) 09/26/2020 1730   PROTEINUR 100 (A) 09/26/2020 1730   UROBILINOGEN 0.2 08/28/2019 1612   UROBILINOGEN 0.2 07/25/2007 0900   NITRITE NEGATIVE 09/26/2020 1730   LEUKOCYTESUR SMALL (A) 09/26/2020 1730   Sepsis Labs Invalid input(s): PROCALCITONIN,  WBC,  LACTICIDVEN Microbiology Recent Results (from the past 240 hour(s))  Resp Panel by RT-PCR (Flu A&B, Covid) Nasopharyngeal Swab     Status: None   Collection Time: 02/10/21 11:30 PM   Specimen: Nasopharyngeal Swab; Nasopharyngeal(NP) swabs in vial transport medium  Result Value Ref Range Status   SARS  Coronavirus 2 by RT PCR NEGATIVE NEGATIVE Final    Comment: (NOTE) SARS-CoV-2 target nucleic acids are NOT DETECTED.  The SARS-CoV-2 RNA is generally detectable in upper respiratory specimens during the acute phase of infection. The lowest concentration of SARS-CoV-2 viral copies this assay can detect is 138 copies/mL. A negative result does not preclude SARS-Cov-2 infection and should not be used as the sole basis for treatment or other patient management decisions. A negative result may occur with  improper specimen collection/handling, submission of specimen other than nasopharyngeal swab, presence of viral mutation(s) within the areas targeted by this assay, and inadequate number of  viral copies(<138 copies/mL). A negative result must be combined with clinical observations, patient history, and epidemiological information. The expected result is Negative.  Fact Sheet for Patients:  EntrepreneurPulse.com.au  Fact Sheet for Healthcare Providers:  IncredibleEmployment.be  This test is no t yet approved or cleared by the [place] FDA and  has been authorized for detection and/or diagnosis of SARS-CoV-2 by FDA under an Emergency Use Authorization (EUA). This EUA will remain  in effect (meaning this test can be used) for the duration of the COVID-19 declaration under Section 564(b)(1) of the Act, 21 U.S.C.section 360bbb-3(b)(1), unless the authorization is terminated  or revoked sooner.       Influenza A by PCR NEGATIVE NEGATIVE Final   Influenza B by PCR NEGATIVE NEGATIVE Final    Comment: (NOTE) The Xpert Xpress SARS-CoV-2/FLU/RSV plus assay is intended as an aid in the diagnosis of influenza from Nasopharyngeal swab specimens and should not be used as a sole basis for treatment. Nasal washings and aspirates are unacceptable for Xpert Xpress SARS-CoV-2/FLU/RSV testing.  Fact Sheet for Patients: EntrepreneurPulse.com.au  Fact Sheet for Healthcare Providers: IncredibleEmployment.be  This test is not yet approved or cleared by the [place] FDA and has been authorized for detection and/or diagnosis of SARS-CoV-2 by FDA under an Emergency Use Authorization (EUA). This EUA will remain in effect (meaning this test can be used) for the duration of the COVID-19 declaration under Section 564(b)(1) of the Act, 21 U.S.C. section 360bbb-3(b)(1), unless the authorization is terminated or revoked.  Performed at [facility], [place] [address], [place], [place] [address]   Surgical pcr screen     Status: None   Collection Time: 02/11/21 12:00 PM   Specimen: Nasal Mucosa; Nasal Swab   Result Value Ref Range Status   MRSA, PCR NEGATIVE NEGATIVE Final   Staphylococcus aureus NEGATIVE NEGATIVE Final    Comment: (NOTE) The Xpert SA Assay (FDA approved for NASAL specimens in patients 51 years of age and older), is one component of a comprehensive surveillance program. It is not intended to diagnose infection nor to guide or monitor treatment. Performed at [facility], [place] [address] [place], [place] [address]   SARS CORONAVIRUS 2 (TAT 6-24 HRS) Nasopharyngeal Nasopharyngeal Swab     Status: None   Collection Time: 02/15/21 12:02 PM   Specimen: Nasopharyngeal Swab  Result Value Ref Range Status   SARS Coronavirus 2 NEGATIVE NEGATIVE Final    Comment: (NOTE) SARS-CoV-2 target nucleic acids are NOT DETECTED.  The SARS-CoV-2 RNA is generally detectable in upper and lower respiratory specimens during the acute phase of infection. Negative results do not preclude SARS-CoV-2 infection, do not rule out co-infections with other pathogens, and should not be used as the sole basis for treatment or other patient management decisions. Negative results must be combined with clinical observations, patient history, and epidemiological  information. The expected result is Negative.  Fact Sheet for Patients: SugarRoll.be  Fact Sheet for Healthcare Providers: https://www.woods-mathews.com/  This test is not yet approved or cleared by the [place] FDA and  has been authorized for detection and/or diagnosis of SARS-CoV-2 by FDA under an Emergency Use Authorization (EUA). This EUA will remain  in effect (meaning this test can be used) for the duration of the COVID-19 declaration under Se ction 564(b)(1) of the Act, 21 U.S.C. section 360bbb-3(b)(1), unless the authorization is terminated or revoked sooner.  Performed at [facility], [place] [address], [place], [place] [address]      Time coordinating discharge: Over 30  minutes  SIGNED:   Phillips Climes, MD  [facility] 02/16/2021, 12:42 PM Pager   If 7PM-7AM, please contact night-coverage [email or web address] Password [email or web address]

## 2021-02-16 NOTE — Progress Notes (Signed)
°   02/16/21 1407  AVS Discharge Documentation  AVS Discharge Instructions Including Medications Provided to patient/caregiver;Placed in discharge packet for receiving facility  Name of Person Receiving AVS Discharge Instructions Including Medications Debarah Crape, RN at [facility] [phone]  Name of Clinician That Reviewed AVS Discharge Instructions Including Medications Venida Jarvis, RN

## 2021-02-16 NOTE — TOC Transition Note (Signed)
Transition of Care [facility]) - CM/SW Discharge Note   Patient Details  Name: Terri Ayala MRN: [id number] Date of Birth: Jun 20, 1955  [facility]) CM/SW Contact:  Benard Halsted, LCSW Phone Number: 02/16/2021, 11:24 AM   Clinical Narrative:    Patient will DC to: [facility] Anticipated DC date: 02/16/21 Family notified: Pt notified her mother Transport by: [facility] 3:30p   Per MD patient ready for DC to [facility]. RN to call report prior to discharge [phone] room 118). RN, patient, patient's family, and facility notified of DC. Discharge Summary and FL2 sent to facility. DC packet on chart. Ambulance transport requested for patient.   CSW will sign off for now as social work intervention is no longer needed. Please consult [place] again if new needs arise.     Final next level of care: Skilled Nursing Facility Barriers to Discharge: Barriers Resolved   Patient Goals and CMS Choice Patient states their goals for this hospitalization and ongoing recovery are:: Get back home CMS Medicare.gov Compare Post Acute Care list provided to:: Patient Choice offered to / list presented to : Patient  Discharge Placement   Existing PASRR number confirmed : 02/16/21          Patient [facility] bed at: [facility] Patient to be transferred to facility by: [place] Name of family member notified: Pt notified mother Patient and family notified of of transfer: 02/16/21  Discharge Plan and Services In-house Referral: Clinical Social Work Discharge Planning Services: CM Consult                                 Social Determinants of Health (K[place]patrick) Interventions     Readmission Risk Interventions No flowsheet data found.

## 2021-02-16 NOTE — Progress Notes (Signed)
Physical Therapy Treatment Patient Details Name: Terri Ayala MRN: [id number] DOB: [birth year] Today's Date: 02/16/2021   History of Present Illness 65 yo admitted 12/14 after found on floor at home with fall. Pt with left ankle fx s/p ORIF, Lt 12th rib fx and Lt L1 TVP fx. PMhx: ETOH abuse, chronic LBP, depression, anxiety    PT Comments    Patient eager to work with PT. Demonstrating better cognition, however required cues to maintain NWB status (with cues able to maintain). Worked on transfers and strengthening exercises. May be able to progress to gait with RW next session.     Recommendations for follow up therapy are one component of a multi-disciplinary discharge planning process, led by the attending physician.  Recommendations may be updated based on patient status, additional functional criteria and insurance authorization.  Follow Up Recommendations  Skilled nursing-short term rehab (<3 hours/day)     Assistance Recommended at Discharge Frequent or constant Supervision/Assistance  Equipment Recommendations  Wheelchair (measurements PT);BSC/3in1    Recommendations for Other Services       Precautions / Restrictions Precautions Precautions: Fall Restrictions LLE Weight Bearing: Non weight bearing     Mobility  Bed Mobility Overal bed mobility: Needs Assistance Bed Mobility: Supine to Sit     Supine to sit: Supervision     General bed mobility comments: to assure NWB LLE    Transfers Overall transfer level: Needs assistance Equipment used: Rolling walker (2 wheels) Transfers: Sit to/from Stand Sit to Stand: Min assist          Lateral/Scoot Transfers: Min assist General transfer comment: Max cues for scooting hips and hand placement to get over dropped armrest. Pt able to maintain L LE NWB during transfer sit to stand and stand to sit with min assist    Ambulation/Gait             Pre-gait activities: worked on "hopping" on RLE in place with use  of UEs pushing down into RW General Gait Details: unable   Chief Strategy Officer    Modified Rankin (Stroke Patients Only)       Balance Overall balance assessment: Needs assistance Sitting-balance support: No upper extremity supported;Feet supported Sitting balance-Leahy Scale: Fair Sitting balance - Comments: EOb without physical assist   Standing balance support: Bilateral upper extremity supported;Reliant on assistive device for balance;During functional activity Standing balance-Leahy Scale: Poor Standing balance comment: min assist with RW and NWB LLE                            Cognition Arousal/Alertness: Awake/alert Behavior During Therapy: Flat affect Overall Cognitive Status: Impaired/Different from baseline Area of Impairment: Problem solving                             Problem Solving: Slow processing;Requires verbal cues;Requires tactile cues;Difficulty sequencing General Comments: difficulty scooting over dropped armrest into recliner and required assist to problem-solve how to scoot across (she had gotten stuck on armrest despite pad overlying armrest)        Exercises General Exercises - Lower Extremity Heel Slides: Both;5 reps Straight Leg Raises: AROM;Both;10 reps Other Exercises Other Exercises: left toe wiggling Other Exercises: chair pushups while maintaining LLE NWB x 10 reps (pt noted to use RLE more so than UEs)    General Comments  Pertinent Vitals/Pain Pain Assessment: 0-10 Pain Score: 6  Pain Location: left ankle Pain Descriptors / Indicators: Guarding;Aching;Discomfort;Heaviness Pain Intervention(s): Limited activity within patient's tolerance    Home Living                          Prior Function            PT Goals (current goals can now be found in the care plan section) Acute Rehab PT Goals Patient Stated Goal: return home to my dog PT Goal Formulation: With  patient Time For Goal Achievement: 02/26/21 Potential to Achieve Goals: Fair Progress towards PT goals: Progressing toward goals    Frequency    Min 3X/week      PT Plan Current plan remains appropriate    Co-evaluation              AM-PAC PT "6 Clicks" Mobility   Outcome Measure  Help needed turning from your back to your side while in a flat bed without using bedrails?: A Little Help needed moving from lying on your back to sitting on the side of a flat bed without using bedrails?: A Little Help needed moving to and from a bed to a chair (including a wheelchair)?: A Little Help needed standing up from a chair using your arms (e.g., wheelchair or bedside chair)?: A Little Help needed to walk in hospital room?: Total Help needed climbing 3-5 steps with a railing? : Total 6 Click Score: 14    End of Session Equipment Utilized During Treatment: Gait belt Activity Tolerance: Patient tolerated treatment well Patient left: in chair;with call bell/phone within reach (chair alarm pad; no chair alarm) Nurse Communication: Mobility status;Other (comment) (no chair alarm; pt verbalizes understanding to call for assist) PT Visit Diagnosis: Other abnormalities of gait and mobility (R26.89);Difficulty in walking, not elsewhere classified (R26.2);Muscle weakness (generalized) (M62.81);History of falling (Z91.81)     Time: 5364-6803 PT Time Calculation (min) (ACUTE ONLY): 16 min  Charges:  $Therapeutic Activity: 8-22 mins                      Arby Barrette, PT Acute Rehabilitation Services  Pager [phone] Office [phone]    Rexanne Mano 02/16/2021, 10:27 AM

## 2021-02-16 NOTE — Progress Notes (Signed)
Speech Language Pathology Treatment: Dysphagia  Patient Details Name: Terri Ayala MRN: [id number] DOB: Jun 25, 1955 Today's Date: 02/16/2021 Time: 8316-7425 SLP Time Calculation (min) (ACUTE ONLY): 12 min  Assessment / Plan / Recommendation Clinical Impression  Pt seen for follow-up dysphagia treatment focused on tolerance of current diet and pt education. RN reports pt tolerating diet well and taking meds without difficulty. Pt consumed regular textures and thin liquids via straw demonstrating x1 overt cough after initial sip of thin. Subsequent intake was without overt s/sx of aspiration. Pt described hx of intermittent globus sensation and regurgitation of pills. Given pt reports and hx, suspect esophageal component. Regular, thin liquid diet continues to be appropriate with adherence to universal swallow/reflux precautions. If symptoms persist/worsen pt may benefit from f/u from GI. No further SLP f/u warranted. Will s/o.    HPI HPI: Pt is a 65 y.o. female who presents by EMS after a fall at home. Pt reported on admission that she stopped drinking alcohol about a week prior to admission, but had been drinking 2-3 bottles of wine a day.  CT head negative. Pt found to have fracture of her left ankle as well as an L1 transverse process fracture. Pt s/p ORIF left ankle. PMH: alcohol abuse, HTN, GERD, osteoporosis.      SLP Plan  Discharge SLP treatment due to (comment);All goals met      Recommendations for follow up therapy are one component of a multi-disciplinary discharge planning process, led by the attending physician.  Recommendations may be updated based on patient status, additional functional criteria and insurance authorization.    Recommendations  Diet recommendations: Regular;Thin liquid Liquids provided via: Straw;Cup Medication Administration: Whole meds with puree Supervision: Patient able to self feed Compensations: Slow rate;Small sips/bites Postural Changes and/or  Swallow Maneuvers: Seated upright 90 degrees;Upright 30-60 min after meal                Oral Care Recommendations: Oral care BID Follow Up Recommendations: No SLP follow up Assistance recommended at discharge: None SLP Visit Diagnosis: Dysphagia, unspecified (R13.10) Plan: Discharge SLP treatment due to (comment);All goals met          Ellwood D[place], [place], [place] Office Number: [phone]  Acie Fredrickson  02/16/2021, 11:13 AM

## 2021-02-16 NOTE — Progress Notes (Addendum)
See new order placed by provider for previous note regarding low potassium level of 2.9.

## 2021-02-17 ENCOUNTER — Ambulatory Visit: Payer: Medicare Other | Admitting: Physician Assistant

## 2021-02-18 ENCOUNTER — Encounter (HOSPITAL_COMMUNITY): Payer: Self-pay | Admitting: Orthopedic Surgery

## 2021-03-29 NOTE — Op Note (Signed)
02/11/2021  6:58 PM  PATIENT:  Terri Ayala  01-Jun-1955 female   MEDICAL RECORD NUMBER: [id number]  PRE-OPERATIVE DIAGNOSIS:   LEFT ANKLE TRIMALLEOLAR FRACTURE POSSIBLE SYNDESMOTIC INJURY  POST-OPERATIVE DIAGNOSIS:   LEFT ANKLE TRIMALLEOLAR FRACTURE SYNDESMOTIC INJURY  PROCEDURE:   OPEN REDUCTION INTERNAL FIXATION OF LEFT TRIMALLEOLAR ANKLE FRACTURE WITHOUT FIXATION OF THE POSTERIOR LIP OPEN REDUCTION INTERNAL FIXATION OF THE SYNDESMOSIS MANUAL APPLICATION OF STRESS ANKLE SYNDESMOSIS UNDER FLUOROSCOPY  SURGEON:  Astrid Divine. Marcelino Scot, M.D.  ASSISTANT:  Ainsley Spinner, PA-C.  ANESTHESIA:  General.  COMPLICATIONS:  None.  TOURNIQUET: None.  ESTIMATED BLOOD LOSS:  50 mL.  DISPOSITION:  To PACU.  CONDITION:  Stable.  DELAY START OF DVT PROPHYLAXIS BECAUSE OF BLEEDING RISK: NO   BRIEF SUMMARY AND INDICATIONS FOR PROCEDURE:  The patient is a 66 y.o. who sustained a fracture of the ankle, treated provisionally with splint application. I discussed with the patient the risks and benefits of surgery including the possibility of infection, DVT, PE, nerve injury, vessel injury, loss of motion, arthritis, symptomatic hardware, heart attack, stroke and need for further surgery, among others. We specifically discussed syndesmotic repair and that some of the implants used for this would likely require subsequent removal. After acknowledging these risks, consent was provided to proceed.  SUMMARY OF PROCEDURE:  The patient was taken to the operating room after administration of a regional block and preoperative antibiotics.  The left lower extremity was prepped and draped in the usual sterile fashion.  A tourniquet was placed about the thigh but never inflated during the procedure.  A timeout was held, and then an incision was made directly over the lateral malleolus with careful dissection to avoid injury to the superficial peroneal nerve.  The periosteum was left intact as we continued deep  dissection.  The fracture site was identified and curettage and lavage used to remove hematoma.  With the assistance of distal manipulation and placement of tenaculums, we were able to obtain an anatomic reduction with  interdigitation of the primary fracture fragments.  Because of the angle and comminution of the fracture site, I was unable to place a lag screw but was able to hold this interdigitated during application of a lateral malleolus plate in bridge fashion.  We used the Trimed system and placed standard fixation in the shaft and distal lateral malleolus, confirming plate position with x-ray and then continuing with a standard fixation as well as a locked fixation.  Final images showed appropriate reduction, screw placement, trajectory, and length.  Next, attention was turned to the medial side.  Here, a curvilinear incision was made to allow for distal placement of medial sled as well as direct access to the medial malleolus fracture site and joint.  The fracture site was cleaned and copious lavage irrigated the joint.  I did inspect the posterior tibialis tendon, which was clearly visible and intact.  I did not identify any large fragments of articular cartilage loss off the dome of the talus.  I then placed a drill hole in the medial metaphysis of the tibia and used a pointed tenaculum to gain compression and reduction of the fracture site, which was also seen to interdigitate as a 15 blade was used to scratch back the periosteum just at the edge for 2 mm or less.  This was followed by placement of medial malleolar jig then K wires and then the medial malleous sled.  Excellent compression was obtained with tightening of the screw. An additional screw was  placed to hold this.  The C-arm was then brought back in and AP, lateral and mortise views showed restoration of ankle alignment reduction.    The posterior malleolar fracture fragment was then evaluated to gauge whether it should be fixed if it  constituted a significant portion of the articular surface or whether, as an attachment site of the syndesmotic ligaments, its disruption   I then performed an external rotation stress view of the ankle under live fluoroscopy.  Instability was identified by lateral translation of the talus, widening of the syndesmotiic interval, and widening of the medial clear space. Consequently we proceeded with syndesmotic fixation. A  pointed tenaculum used to apply pressure for reduction from head of most distal screw in the plate. A quadricortical screw was then placed from the fibula into the tibia with 15 degrees of anteversion.    PROGNOSIS: The patient will be nonweightbearing in the splint with ice and elevation over the next 3 to 5 days.  We will plan to see her back in the office in 10-14 days for removal of sutures and transition to a Cam boot with unrestricted range of motion of the  ankle at that time.  Weightbearing at 8 weeks. Given the history of alcohol dependence concern for noncompliance with loss of fixation and reduction as well as other complications is elevated.

## 2021-04-26 ENCOUNTER — Other Ambulatory Visit: Payer: Self-pay | Admitting: Physician Assistant

## 2021-04-26 DIAGNOSIS — G43009 Migraine without aura, not intractable, without status migrainosus: Secondary | ICD-10-CM

## 2021-04-27 ENCOUNTER — Telehealth: Payer: Self-pay | Admitting: Physician Assistant

## 2021-04-28 ENCOUNTER — Telehealth: Payer: Self-pay | Admitting: Physician Assistant

## 2021-04-28 NOTE — Telephone Encounter (Signed)
Patient came in office stating she had been given a prescription of Oxycodone for a fall/broken ankle and she was out of the medication. She stated the pharmacy would only give her 7 at a time and she didn't understand why and wanted [place] to call and fix it. Unfortunately we were not the prescriber of the medication and she will need to speak with the physician who wrote the medication for her originally.  ?

## 2021-05-04 NOTE — Telephone Encounter (Signed)
close

## 2021-05-05 ENCOUNTER — Encounter: Payer: Self-pay | Admitting: Nurse Practitioner

## 2021-05-05 ENCOUNTER — Ambulatory Visit (INDEPENDENT_AMBULATORY_CARE_PROVIDER_SITE_OTHER): Payer: Medicare Other | Admitting: Nurse Practitioner

## 2021-05-05 ENCOUNTER — Other Ambulatory Visit: Payer: Self-pay

## 2021-05-05 VITALS — BP 155/83 | HR 93 | Temp 98.2°F | Ht 62.0 in | Wt 142.6 lb

## 2021-05-05 DIAGNOSIS — G8929 Other chronic pain: Secondary | ICD-10-CM

## 2021-05-05 DIAGNOSIS — I10 Essential (primary) hypertension: Secondary | ICD-10-CM | POA: Diagnosis not present

## 2021-05-05 DIAGNOSIS — M25572 Pain in left ankle and joints of left foot: Secondary | ICD-10-CM

## 2021-05-05 DIAGNOSIS — Z6826 Body mass index (BMI) 26.0-26.9, adult: Secondary | ICD-10-CM | POA: Diagnosis not present

## 2021-05-05 DIAGNOSIS — F331 Major depressive disorder, recurrent, moderate: Secondary | ICD-10-CM | POA: Diagnosis not present

## 2021-05-05 MED ORDER — MELOXICAM 7.5 MG PO TABS: 7.5000 mg | ORAL_TABLET | Freq: Every day | ORAL | 1 refills | Status: DC

## 2021-05-05 MED ORDER — GABAPENTIN 100 MG PO CAPS: 100.0000 mg | ORAL_CAPSULE | Freq: Three times a day (TID) | ORAL | 1 refills | Status: DC

## 2021-05-05 NOTE — Progress Notes (Unsigned)
Established patient visit   Patient: Terri Ayala   DOB: 1955-09-30   66 y.o. Female  MRN: [id number] Visit Date: 05/05/2021   Chief Complaint  Patient presents with   Medication Refill   Subjective    HPI  The patient states that she was recently released from rehab facility due to fractured ankle on 04/22/2021. She continues to have PT and OT at home. Therapy increases the level of pain that she is in. She states that when she was discharged she was given a prescription for oxycodone which was written for 30 days. Pharmacy was only able to fill 7 day of this prescription. The patient does have appointment with surgeon next Wednesday. Advised her that she needs to contact the surgeon who is responsible for her care to fill the pain medication. She is also asking for gabapentin and meloxicam to help control pain.    Medications: Outpatient Medications Prior to Visit  Medication Sig   acetaminophen (TYLENOL) 650 MG CR tablet Take 650 mg by mouth every 8 (eight) hours as needed for pain.   albuterol (VENTOLIN HFA) 108 (90 Base) MCG/ACT inhaler Inhale 2 puffs into the lungs every 6 (six) hours as needed for wheezing or shortness of breath.   alendronate (FOSAMAX) 70 MG tablet Take 70 mg by mouth every Monday. Take with a full glass of water on an empty stomach.   busPIRone (BUSPAR) 15 MG tablet Take 15 mg by mouth 2 (two) times daily.   Calcium Carbonate-Vitamin D (CALCIUM-D PO) Take 1 tablet by mouth every morning.   carvedilol (COREG) 3.125 MG tablet Take 1 tablet (3.125 mg total) by mouth 2 (two) times daily with a meal.   chlordiazePOXIDE (LIBRIUM) 10 MG capsule Please use 10 mg oral 3 times daily for 1 day, then decrease to 10 mg oral twice daily for 3 days, then 10 mg oral daily for 3 days then stop.   Cholecalciferol (VITAMIN D-3 PO) Take 1 tablet by mouth every morning.   DULoxetine (CYMBALTA) 60 MG capsule TAKE 1 CAPSULE (60 MG TOTAL) BY MOUTH DAILY. (Patient taking differently: Take  60 mg by mouth 2 (two) times daily.)   hydrocortisone 2.5 % cream Apply 1 application topically 2 (two) times daily as needed (itching).   hydrOXYzine (ATARAX/VISTARIL) 25 MG tablet Take 25 mg by mouth 2 (two) times daily.   Multiple Vitamin (MULTIVITAMIN WITH MINERALS) TABS tablet Take 1 tablet by mouth daily.   oxyCODONE (OXY IR/ROXICODONE) 5 MG immediate release tablet Take 1 tablet (5 mg total) by mouth every 6 (six) hours as needed for moderate pain or severe pain.   pantoprazole (PROTONIX) 40 MG tablet TAKE 1 TABLET BY MOUTH 2 TIMES DAILY. (Patient taking differently: Take 40 mg by mouth every morning.)   SUMAtriptan (IMITREX) 100 MG tablet TAKE 1 TABLET BY MOUTH AT THE FIRST SIGN OF MIGRAINE, MAY REPEAT IN 2 HOURS. MAX 2 TABLETS IN 24 HOURS   thiamine 100 MG tablet Take 1 tablet (100 mg total) by mouth daily.   traZODone (DESYREL) 50 MG tablet Take 50 mg by mouth at bedtime as needed for sleep.   triamcinolone cream (KENALOG) 0.1 % Apply 1 application topically 2 (two) times daily as needed (facial itching).   enoxaparin (LOVENOX) 40 MG/0.4ML injection Inject 0.4 mLs (40 mg total) into the skin daily for 18 days. Give for 18 days then stop.   gabapentin (NEURONTIN) 100 MG capsule Take 1 capsule (100 mg total) by mouth 3 (three) times daily. (  Patient not taking: Reported on 02/13/2021)   potassium chloride (KLOR-CON M) 10 MEQ tablet Take 2 tablets (20 mEq total) by mouth daily for 5 days. Please take for 5 days then stop.   No facility-administered medications prior to visit.    Review of Systems  {Labs (Optional):23779}   Objective    BP (!) 161/81    Pulse 93    Temp 98.2 F (36.8 C)    Ht '5\' 2"'$  (1.575 m)    Wt 142 lb 9.6 oz (64.7 kg)    SpO2 98%    BMI 26.08 kg/m  BP Readings from Last 3 Encounters:  05/05/21 (!) 161/81  02/16/21 (!) 159/99  12/07/20 123/79    Wt Readings from Last 3 Encounters:  05/05/21 142 lb 9.6 oz (64.7 kg)  02/14/21 130 lb (59 kg)  12/07/20 143 lb 8  oz (65.1 kg)    Physical Exam  ***  No results found for any visits on 05/05/21.  Assessment & Plan     Problem List Items Addressed This Visit   None    No follow-ups on file.         Ronnell Freshwater, NP  [facility] [phone] (phone) [phone] (fax)  [place]

## 2021-05-12 DIAGNOSIS — Z6826 Body mass index (BMI) 26.0-26.9, adult: Secondary | ICD-10-CM | POA: Insufficient documentation

## 2021-05-12 DIAGNOSIS — G8929 Other chronic pain: Secondary | ICD-10-CM | POA: Insufficient documentation

## 2021-05-17 ENCOUNTER — Other Ambulatory Visit: Payer: Self-pay

## 2021-05-17 ENCOUNTER — Encounter: Payer: Self-pay | Admitting: Physician Assistant

## 2021-05-17 ENCOUNTER — Telehealth (INDEPENDENT_AMBULATORY_CARE_PROVIDER_SITE_OTHER): Payer: Medicare Other | Admitting: Physician Assistant

## 2021-05-17 VITALS — Ht 62.0 in | Wt 145.0 lb

## 2021-05-17 DIAGNOSIS — L309 Dermatitis, unspecified: Secondary | ICD-10-CM | POA: Insufficient documentation

## 2021-05-17 DIAGNOSIS — L821 Other seborrheic keratosis: Secondary | ICD-10-CM | POA: Insufficient documentation

## 2021-05-17 DIAGNOSIS — S82892A Other fracture of left lower leg, initial encounter for closed fracture: Secondary | ICD-10-CM | POA: Diagnosis not present

## 2021-05-17 DIAGNOSIS — L719 Rosacea, unspecified: Secondary | ICD-10-CM | POA: Insufficient documentation

## 2021-05-17 DIAGNOSIS — D225 Melanocytic nevi of trunk: Secondary | ICD-10-CM | POA: Insufficient documentation

## 2021-05-17 NOTE — Progress Notes (Signed)
? ?  ? ? ?Telehealth office visit note for Terri Reid, PA-C- at Primary Care at [facility] ?  ?I connected with current patient today by telephone and verified that I am speaking with the correct person   ? Location of the patient: Home ? Location of the provider: Office ?- This visit type was conducted due to national recommendations for restrictions regarding the COVID-19 Pandemic (e.g. social distancing) in an effort to limit this patient's exposure and mitigate transmission in our community.    ?- No physical exam could be performed with this format, beyond that communicated to [place] by the patient/ family members as noted.   ?- Additionally my office staff/ schedulers were to discuss with the patient that there may be a monetary charge related to this service, depending on their medical insurance.  My understanding is that patient understood and consented to proceed.   ?  ?_________________________________________________________________________________ ? ? ?History of Present Illness: ?Patient calls in to request refill of pain medication (oxycodone). Patient reports has a follow-up visit with Orthopedic trauma specialist Dr. [place]e [place] next Wednesday. States when she was discharged from the rehab facility she was given a rx for pain medication but was only given a week's supply by the pharmacy. States continues to have ankle pain which limits her ability to do activities at home. Meloxicam 7.5 mg has been helping some.  ? ? ? ?GAD 7 : Generalized Anxiety Score 05/05/2021 12/07/2020 10/19/2020  ?Nervous, Anxious, on Edge 1 1 0  ?Control/stop worrying 1 0 0  ?Worry too much - different things 1 0 0  ?Trouble relaxing 1 1 0  ?Restless 1 0 0  ?Easily annoyed or irritable 1 0 0  ?Afraid - awful might happen 0 0 0  ?Total GAD 7 Score 6 2 0  ?Anxiety Difficulty - Somewhat difficult -  ? ? ?Depression screen [facility] 2/9 05/17/2021 05/05/2021 12/07/2020 10/19/2020 04/15/2020  ?Decreased Interest 0 1 1 0 0  ?Down, Depressed,  Hopeless 0 1 0 0 0  ?PHQ - 2 Score 0 2 1 0 0  ?Altered sleeping 0 1 1 0 0  ?Tired, decreased energy '1 1 1 '$ 0 1  ?Change in appetite 0 1 0 0 0  ?Feeling bad or failure about yourself  0 1 0 0 0  ?Trouble concentrating 0 1 0 0 0  ?Moving slowly or fidgety/restless 0 1 0 0 0  ?Suicidal thoughts 0 0 0 0 0  ?PHQ-9 Score '1 8 3 '$ 0 1  ?Difficult doing work/chores Not difficult at all - Somewhat difficult - -  ?Some recent data might be hidden  ? ? ? ? ?Impression and Recommendations:   ? ? ?1. Ankle fracture, left, closed, initial encounter   ?  ?-Reviewed hospital notes, labs and imaging. ?-02/10/2021: Ankle xray:IMPRESSION: ?Bimalleolar fracture with widening of the medial ankle mortise. ?-Patient is s/p ORIF. ?-Do not have SNF records to review. ?-Discussed with patient should contact orthopedic surgeon for refill of pain medication. Should follow-up as scheduled. ?-Advised can take meloxicam 15 mg if needed. Recommend to continue with Gabapentin 100 mg TID. ? ? ?- As part of my medical decision making, I reviewed the following data within the [facility] [place] History obtained from pt /family, CMA notes reviewed and incorporated if applicable, Labs reviewed, Radiograph/ tests reviewed if applicable and OV notes from prior OV's with me, as well as any other specialists she/he has seen since seeing me last, were all reviewed and used  in my medical decision making process today.   ? ?- Additionally, when appropriate, discussion had with patient regarding our treatment plan, and their biases/concerns about that plan were used in my medical decision making today.   ? ?- The patient agreed with the plan and demonstrated an understanding of the instructions.   No barriers to understanding were identified.  ?   ?- The patient was advised to call back or seek an in-person evaluation if the symptoms worsen or if the condition fails to improve as anticipated. ? ? ?Return for [facility] and [facility] in 2-3 months .  ? ? ?No orders of  the defined types were placed in this encounter. ? ? ?No orders of the defined types were placed in this encounter. ? ? ?There are no discontinued medications.  ? ? ? ?Time spent on telephone encounter was 13 minutes. ? ? ?Note:  This note was prepared with assistance of Dragon voice recognition software. Occasional wrong-word or sound-a-like substitutions may have occurred due to the inherent limitations of voice recognition software. ? ? ? ?The [place] was signed into law in 2016 which includes the topic of electronic health records.  This provides immediate access to information in MyChart.  This includes consultation notes, operative notes, office notes, lab results and pathology reports.  If you have any questions about what you read please let us know at your next visit or call us at the office.  We are right here with you. ? ? ?__________________________________________________________________________________ ? ? ? ? ?Patient Care Team  ?  Relationship Specialty Notifications Start End  ?Terri Reid, PA-C PCP - General Physician Assistant All results, Admissions 07/08/19   ?Burnell Blanks, MD PCP - Cardiology Cardiology  05/08/18   ?Bobbye Charleston, MD Consulting Physician Obstetrics and Gynecology  07/10/18   ?Juanita Craver, MD Consulting Physician Gastroenterology  07/10/18   ? ? ? ?-Vitals obtained; medications/ allergies reconciled;  personal medical, social, Sx etc.histories were updated by CMA, reviewed by me and are reflected in chart ? ? ?Patient Active Problem List  ? Diagnosis Date Noted  ? Rosacea 05/17/2021  ? Dermatitis 05/17/2021  ? Seborrheic keratosis 05/17/2021  ? Melanocytic nevi of trunk 05/17/2021  ? Chronic pain of left ankle 05/12/2021  ? Body mass index 26.0-26.9, adult 05/12/2021  ? Ankle fracture, left, closed, initial encounter 02/10/2021  ? L1 vertebral fracture ([place]) 02/10/2021  ? Thyroid nodule 02/10/2021  ? Prolonged QT interval 02/10/2021  ? Elevated  cholesterol 10/19/2020  ? Pedal edema 10/19/2020  ? History of colonic polyps 03/25/2020  ? Hemorrhage of rectum and anus 03/25/2020  ? Gastroparesis 03/25/2020  ? Gastroesophageal reflux disease 03/25/2020  ? Flatulence, eructation and gas pain 03/25/2020  ? Family history of malignant neoplasm of gastrointestinal tract 03/25/2020  ? Eosinophilic esophagitis 61/44/3154  ? Constipation 03/25/2020  ? Change in bowel habit 03/25/2020  ? Hospital discharge follow-up 04/04/2019  ? Metabolic acidosis 00/86/7619  ? Abnormal liver function 02/06/2019  ? Alcohol withdrawal delirium ([place]) 02/06/2019  ? Elevated LFTs 07/12/2018  ? Healthcare maintenance 06/26/2018  ? Rib fracture 01/04/2018  ? Chest pain 12/08/2017  ? Leukocytosis 12/08/2017  ? Essential hypertension 12/08/2017  ? Alcohol withdrawal ([place]) 07/21/2017  ? Lower urinary tract infectious disease 07/21/2017  ? Osteopenia 08/27/2016  ? Alcohol dependence with withdrawal ([place]) 01/27/2016  ? Generalized anxiety disorder 01/27/2016  ? Severe episode of recurrent major depressive disorder, without psychotic features ([place]) 01/27/2016  ? Major depressive disorder, recurrent  episode, moderate ([place])   ? Alcohol use disorder, severe, dependence ([place]) 04/03/2014  ? Lumbosacral radiculopathy at L5 05/20/2013  ? Nonspecific elevation of levels of transaminase or lactic acid dehydrogenase (LDH) 03/16/2013  ? Hepatic steatosis 03/16/2013  ? Nausea and vomiting 03/16/2013  ? Chronic cholecystitis 03/14/2013  ? Cervical arthritis 10/12/2012  ? Palpitations 11/29/2010  ? SVT (supraventricular tachycardia) ([place]) 11/29/2010  ? Mitral valve prolapse 11/29/2010  ? Decreased libido 11/15/2010  ? GOITER, MULTINODULAR 08/17/2009  ? ANXIETY 08/17/2009  ? Migraine headache 08/17/2009  ? Hearing loss 08/17/2009  ? Osteoporosis 08/17/2009  ? ASYMPTOMATIC POSTMENOPAUSAL STATUS 08/17/2009  ? DYSPHAGIA 06/19/2007  ? ? ? ?Current Meds  ?Medication Sig  ? acetaminophen (TYLENOL) 650 MG CR tablet  Take 650 mg by mouth every 8 (eight) hours as needed for pain.  ? albuterol (VENTOLIN HFA) 108 (90 Base) MCG/ACT inhaler Inhale 2 puffs into the lungs every 6 (six) hours as needed for wheezing or shortnes

## 2021-06-02 ENCOUNTER — Other Ambulatory Visit: Payer: Self-pay | Admitting: Podiatry

## 2021-06-08 ENCOUNTER — Emergency Department (HOSPITAL_COMMUNITY): Payer: Medicare Other

## 2021-06-08 ENCOUNTER — Inpatient Hospital Stay (HOSPITAL_COMMUNITY)
Admission: EM | Admit: 2021-06-08 | Discharge: 2021-06-11 | DRG: 897 | Disposition: A | Discharge status: Home Health | Payer: Medicare Other | Attending: Internal Medicine | Admitting: Internal Medicine

## 2021-06-08 ENCOUNTER — Encounter (HOSPITAL_COMMUNITY): Payer: Self-pay | Admitting: Emergency Medicine

## 2021-06-08 ENCOUNTER — Other Ambulatory Visit: Payer: Self-pay

## 2021-06-08 DIAGNOSIS — F32A Depression, unspecified: Secondary | ICD-10-CM | POA: Diagnosis present

## 2021-06-08 DIAGNOSIS — G8929 Other chronic pain: Secondary | ICD-10-CM

## 2021-06-08 DIAGNOSIS — Z79899 Other long term (current) drug therapy: Secondary | ICD-10-CM

## 2021-06-08 DIAGNOSIS — F10231 Alcohol dependence with withdrawal delirium: Principal | ICD-10-CM | POA: Diagnosis present

## 2021-06-08 DIAGNOSIS — Z888 Allergy status to other drugs, medicaments and biological substances status: Secondary | ICD-10-CM

## 2021-06-08 DIAGNOSIS — K219 Gastro-esophageal reflux disease without esophagitis: Secondary | ICD-10-CM | POA: Diagnosis present

## 2021-06-08 DIAGNOSIS — Z8262 Family history of osteoporosis: Secondary | ICD-10-CM

## 2021-06-08 DIAGNOSIS — E785 Hyperlipidemia, unspecified: Secondary | ICD-10-CM | POA: Diagnosis present

## 2021-06-08 DIAGNOSIS — Z800 Family history of malignant neoplasm of digestive organs: Secondary | ICD-10-CM

## 2021-06-08 DIAGNOSIS — M810 Age-related osteoporosis without current pathological fracture: Secondary | ICD-10-CM | POA: Diagnosis present

## 2021-06-08 DIAGNOSIS — Z881 Allergy status to other antibiotic agents status: Secondary | ICD-10-CM | POA: Diagnosis not present

## 2021-06-08 DIAGNOSIS — I471 Supraventricular tachycardia: Secondary | ICD-10-CM | POA: Diagnosis present

## 2021-06-08 DIAGNOSIS — Z7983 Long term (current) use of bisphosphonates: Secondary | ICD-10-CM

## 2021-06-08 DIAGNOSIS — S82892D Other fracture of left lower leg, subsequent encounter for closed fracture with routine healing: Secondary | ICD-10-CM | POA: Diagnosis not present

## 2021-06-08 DIAGNOSIS — G894 Chronic pain syndrome: Secondary | ICD-10-CM | POA: Diagnosis present

## 2021-06-08 DIAGNOSIS — W19XXXA Unspecified fall, initial encounter: Secondary | ICD-10-CM | POA: Diagnosis present

## 2021-06-08 DIAGNOSIS — M19042 Primary osteoarthritis, left hand: Secondary | ICD-10-CM | POA: Diagnosis present

## 2021-06-08 DIAGNOSIS — Z791 Long term (current) use of non-steroidal anti-inflammatories (NSAID): Secondary | ICD-10-CM | POA: Diagnosis not present

## 2021-06-08 DIAGNOSIS — I10 Essential (primary) hypertension: Secondary | ICD-10-CM | POA: Diagnosis present

## 2021-06-08 DIAGNOSIS — G43909 Migraine, unspecified, not intractable, without status migrainosus: Secondary | ICD-10-CM | POA: Diagnosis present

## 2021-06-08 DIAGNOSIS — Z87891 Personal history of nicotine dependence: Secondary | ICD-10-CM

## 2021-06-08 DIAGNOSIS — Z885 Allergy status to narcotic agent status: Secondary | ICD-10-CM

## 2021-06-08 DIAGNOSIS — M545 Low back pain, unspecified: Secondary | ICD-10-CM | POA: Diagnosis present

## 2021-06-08 DIAGNOSIS — E8729 Other acidosis: Secondary | ICD-10-CM | POA: Diagnosis present

## 2021-06-08 DIAGNOSIS — M19041 Primary osteoarthritis, right hand: Secondary | ICD-10-CM | POA: Diagnosis present

## 2021-06-08 DIAGNOSIS — F419 Anxiety disorder, unspecified: Secondary | ICD-10-CM | POA: Diagnosis present

## 2021-06-08 LAB — CBC WITH DIFFERENTIAL/PLATELET
Abs Immature Granulocytes: 0.05 10*3/uL (ref 0.00–0.07)
Basophils Absolute: 0.1 10*3/uL (ref 0.0–0.1)
Basophils Relative: 1 %
Eosinophils Absolute: 0 10*3/uL (ref 0.0–0.5)
Eosinophils Relative: 0 %
HCT: 40.1 % (ref 36.0–46.0)
Hemoglobin: 13.7 g/dL (ref 12.0–15.0)
Immature Granulocytes: 1 %
Lymphocytes Relative: 13 %
Lymphs Abs: 1.2 10*3/uL (ref 0.7–4.0)
MCH: 31.5 pg (ref 26.0–34.0)
MCHC: 34.2 g/dL (ref 30.0–36.0)
MCV: 92.2 fL (ref 80.0–100.0)
Monocytes Absolute: 0.8 10*3/uL (ref 0.1–1.0)
Monocytes Relative: 8 %
Neutro Abs: 7.4 10*3/uL (ref 1.7–7.7)
Neutrophils Relative %: 77 %
Platelets: 198 10*3/uL (ref 150–400)
RBC: 4.35 MIL/uL (ref 3.87–5.11)
RDW: 15.5 % (ref 11.5–15.5)
WBC: 9.5 10*3/uL (ref 4.0–10.5)
nRBC: 0 % (ref 0.0–0.2)

## 2021-06-08 LAB — I-STAT VENOUS BLOOD GAS, ED
Acid-Base Excess: 0 mmol/L (ref 0.0–2.0)
Bicarbonate: 22.2 mmol/L (ref 20.0–28.0)
Calcium, Ion: 0.95 mmol/L — ABNORMAL LOW (ref 1.15–1.40)
HCT: 42 % (ref 36.0–46.0)
Hemoglobin: 14.3 g/dL (ref 12.0–15.0)
O2 Saturation: 89 %
Potassium: 4 mmol/L (ref 3.5–5.1)
Sodium: 140 mmol/L (ref 135–145)
TCO2: 23 mmol/L (ref 22–32)
pCO2, Ven: 28.6 mmHg — ABNORMAL LOW (ref 44–60)
pH, Ven: 7.497 — ABNORMAL HIGH (ref 7.25–7.43)
pO2, Ven: 50 mmHg — ABNORMAL HIGH (ref 32–45)

## 2021-06-08 LAB — CBC
HCT: 40.1 % (ref 36.0–46.0)
Hemoglobin: 13.1 g/dL (ref 12.0–15.0)
MCH: 30.9 pg (ref 26.0–34.0)
MCHC: 32.7 g/dL (ref 30.0–36.0)
MCV: 94.6 fL (ref 80.0–100.0)
Platelets: 181 10*3/uL (ref 150–400)
RBC: 4.24 MIL/uL (ref 3.87–5.11)
RDW: 15.7 % — ABNORMAL HIGH (ref 11.5–15.5)
WBC: 9.8 10*3/uL (ref 4.0–10.5)
nRBC: 0 % (ref 0.0–0.2)

## 2021-06-08 LAB — PROTIME-INR
INR: 1 (ref 0.8–1.2)
Prothrombin Time: 12.9 seconds (ref 11.4–15.2)

## 2021-06-08 LAB — COMPREHENSIVE METABOLIC PANEL
ALT: 19 U/L (ref 0–44)
AST: 70 U/L — ABNORMAL HIGH (ref 15–41)
Albumin: 5.4 g/dL — ABNORMAL HIGH (ref 3.5–5.0)
Alkaline Phosphatase: 91 U/L (ref 38–126)
Anion gap: 25 — ABNORMAL HIGH (ref 5–15)
BUN: 15 mg/dL (ref 8–23)
CO2: 14 mmol/L — ABNORMAL LOW (ref 22–32)
Calcium: 9.7 mg/dL (ref 8.9–10.3)
Chloride: 100 mmol/L (ref 98–111)
Creatinine, Ser: 0.76 mg/dL (ref 0.44–1.00)
GFR, Estimated: >60 mL/min (ref >60)
Glucose, Bld: 89 mg/dL (ref 70–99)
Potassium: 4.6 mmol/L (ref 3.5–5.1)
Sodium: 139 mmol/L (ref 135–145)
Total Bilirubin: 1.4 mg/dL — ABNORMAL HIGH (ref 0.3–1.2)
Total Protein: 9.2 g/dL — ABNORMAL HIGH (ref 6.5–8.1)

## 2021-06-08 LAB — ETHANOL: Alcohol, Ethyl (B): <10 mg/dL (ref <10)

## 2021-06-08 LAB — CREATININE, SERUM
Creatinine, Ser: 0.77 mg/dL (ref 0.44–1.00)
GFR, Estimated: >60 mL/min (ref >60)

## 2021-06-08 LAB — MAGNESIUM: Magnesium: 2.1 mg/dL (ref 1.7–2.4)

## 2021-06-08 LAB — TSH: TSH: 3.332 u[IU]/mL (ref 0.350–4.500)

## 2021-06-08 LAB — CK: Total CK: 133 U/L (ref 38–234)

## 2021-06-08 LAB — LACTIC ACID, PLASMA: Lactic Acid, Venous: 2 mmol/L (ref 0.5–1.9)

## 2021-06-08 MED ORDER — LORAZEPAM 2 MG/ML IJ SOLN
2.0000 mg | Freq: Once | INTRAMUSCULAR | Status: AC
  Administered 2021-06-08: 2 mg via INTRAVENOUS
  Filled 2021-06-08: qty 1

## 2021-06-08 MED ORDER — LACTATED RINGERS IV BOLUS
1000.0000 mL | Freq: Once | INTRAVENOUS | Status: AC
  Administered 2021-06-08: 1000 mL via INTRAVENOUS

## 2021-06-08 MED ORDER — CLONIDINE HCL 0.1 MG PO TABS
0.1000 mg | ORAL_TABLET | Freq: Four times a day (QID) | ORAL | Status: DC | PRN
Start: 2021-06-08 — End: 2021-06-11
  Administered 2021-06-09: 0.1 mg via ORAL
  Filled 2021-06-08: qty 1

## 2021-06-08 MED ORDER — ENOXAPARIN SODIUM 40 MG/0.4ML IJ SOSY
40.0000 mg | PREFILLED_SYRINGE | INTRAMUSCULAR | Status: DC
  Administered 2021-06-08 – 2021-06-10 (×3): 40 mg via SUBCUTANEOUS
  Filled 2021-06-08 (×3): qty 0.4

## 2021-06-08 MED ORDER — THIAMINE HCL 100 MG/ML IJ SOLN
100.0000 mg | Freq: Every day | INTRAMUSCULAR | Status: DC
  Administered 2021-06-08: 100 mg via INTRAVENOUS
  Filled 2021-06-08: qty 2

## 2021-06-08 MED ORDER — HYDRALAZINE HCL 20 MG/ML IJ SOLN: 10.0000 mg | Freq: Four times a day (QID) | INTRAMUSCULAR | Status: DC | PRN

## 2021-06-08 MED ORDER — LORAZEPAM 2 MG/ML IJ SOLN
0.0000 mg | Freq: Four times a day (QID) | INTRAMUSCULAR | Status: AC
  Administered 2021-06-08 (×2): 2 mg via INTRAVENOUS
  Filled 2021-06-08 (×2): qty 1

## 2021-06-08 MED ORDER — LORAZEPAM 1 MG PO TABS
0.0000 mg | ORAL_TABLET | Freq: Four times a day (QID) | ORAL | Status: AC
  Administered 2021-06-09 (×3): 1 mg via ORAL
  Administered 2021-06-10: 2 mg via ORAL
  Filled 2021-06-08 (×2): qty 1
  Filled 2021-06-08 (×2): qty 2

## 2021-06-08 MED ORDER — LORAZEPAM 1 MG PO TABS: 0.0000 mg | ORAL_TABLET | Freq: Two times a day (BID) | ORAL | Status: DC

## 2021-06-08 MED ORDER — ADULT MULTIVITAMIN W/MINERALS CH
1.0000 | ORAL_TABLET | Freq: Every day | ORAL | Status: DC
  Administered 2021-06-08 – 2021-06-11 (×4): 1 via ORAL
  Filled 2021-06-08 (×4): qty 1

## 2021-06-08 MED ORDER — LORAZEPAM 2 MG/ML IJ SOLN: 1.0000 mg | Freq: Once | INTRAMUSCULAR | Status: DC

## 2021-06-08 MED ORDER — THIAMINE HCL 100 MG PO TABS: 100.0000 mg | ORAL_TABLET | Freq: Every day | ORAL | Status: DC

## 2021-06-08 MED ORDER — FOLIC ACID 1 MG PO TABS
1.0000 mg | ORAL_TABLET | Freq: Every day | ORAL | Status: DC
  Administered 2021-06-08 – 2021-06-11 (×4): 1 mg via ORAL
  Filled 2021-06-08 (×4): qty 1

## 2021-06-08 MED ORDER — LORAZEPAM 1 MG PO TABS
1.0000 mg | ORAL_TABLET | ORAL | Status: DC | PRN
  Administered 2021-06-10: 1 mg via ORAL
  Filled 2021-06-08: qty 1

## 2021-06-08 MED ORDER — THIAMINE HCL 100 MG PO TABS
100.0000 mg | ORAL_TABLET | Freq: Every day | ORAL | Status: DC
  Administered 2021-06-09 – 2021-06-11 (×3): 100 mg via ORAL
  Filled 2021-06-08 (×3): qty 1

## 2021-06-08 MED ORDER — LORAZEPAM 2 MG/ML IJ SOLN: 0.0000 mg | Freq: Two times a day (BID) | INTRAMUSCULAR | Status: DC

## 2021-06-08 MED ORDER — IPRATROPIUM BROMIDE 0.02 % IN SOLN
0.5000 mg | Freq: Four times a day (QID) | RESPIRATORY_TRACT | Status: DC
  Administered 2021-06-08: 0.5 mg via RESPIRATORY_TRACT
  Filled 2021-06-08: qty 2.5

## 2021-06-08 MED ORDER — SODIUM CHLORIDE 0.9 % IV SOLN: INTRAVENOUS | Status: AC

## 2021-06-08 MED ORDER — LEVALBUTEROL TARTRATE 45 MCG/ACT IN AERO
2.0000 | INHALATION_SPRAY | Freq: Four times a day (QID) | RESPIRATORY_TRACT | Status: DC | PRN
  Filled 2021-06-08: qty 15

## 2021-06-08 NOTE — ED Provider Notes (Signed)
?[facility] ?Provider Note ? ?CSN: [id number] ?Arrival date & time: 06/08/21 1413 ? ?Chief Complaint(s) ?Withdrawal ? ?HPI ?Terri Ayala is a 66 y.o. female with PMH alcohol abuse, previous alcohol withdrawal, HLD who presents emergency department for evaluation of alcohol withdrawal.  Patient states that her last drink was this morning but she continues to have tremors and all over body pain.  She states that she fell yesterday and struck her head and neck on the ground.  She endorses pain to the "entire back".  But denies numbness, tingling, weakness or other neurologic complaints.  Denies chest pain, shortness of breath, abdominal pain or other systemic complaints. ? ?HPI ? ?Past Medical History ?Past Medical History:  ?Diagnosis Date  ? Alcohol withdrawal ([place]) 11/2017; 01/04/2018  ? Alcoholism ([place])   ? Anxiety   ? Arthritis   ? "hands" (01/04/2018)  ? ASYMPTOMATIC POSTMENOPAUSAL STATUS 08/17/2009  ? Chronic lower back pain   ? Depression   ? DYSPHAGIA 06/19/2007  ? Fatty liver, alcoholic   ? GERD (gastroesophageal reflux disease)   ? GOITER, MULTINODULAR 08/17/2009  ? Hyperlipidemia   ? Migraines   ? "not sure what triggers them; I'll have 1 q couple weeks or more; come 2 days in a row when they come" (01/04/2018)  ? Mitral valve prolapse   ? OSTEOPOROSIS 08/17/2009  ? PONV (postoperative nausea and vomiting)   ? Supraventricular tachycardia ([place])   ? ?Patient Active Problem List  ? Diagnosis Date Noted  ? Rosacea 05/17/2021  ? Dermatitis 05/17/2021  ? Seborrheic keratosis 05/17/2021  ? Melanocytic nevi of trunk 05/17/2021  ? Chronic pain of left ankle 05/12/2021  ? Body mass index 26.0-26.9, adult 05/12/2021  ? Ankle fracture, left, closed, initial encounter 02/10/2021  ? L1 vertebral fracture ([place]) 02/10/2021  ? Thyroid nodule 02/10/2021  ? Prolonged QT interval 02/10/2021  ? Elevated cholesterol 10/19/2020  ? Pedal edema 10/19/2020  ? History of colonic polyps 03/25/2020  ?  Hemorrhage of rectum and anus 03/25/2020  ? Gastroparesis 03/25/2020  ? Gastroesophageal reflux disease 03/25/2020  ? Flatulence, eructation and gas pain 03/25/2020  ? Family history of malignant neoplasm of gastrointestinal tract 03/25/2020  ? Eosinophilic esophagitis 32/35/5732  ? Constipation 03/25/2020  ? Change in bowel habit 03/25/2020  ? Hospital discharge follow-up 04/04/2019  ? Metabolic acidosis 20/25/4270  ? Abnormal liver function 02/06/2019  ? Alcohol withdrawal delirium ([place]) 02/06/2019  ? Elevated LFTs 07/12/2018  ? Healthcare maintenance 06/26/2018  ? Rib fracture 01/04/2018  ? Chest pain 12/08/2017  ? Leukocytosis 12/08/2017  ? Essential hypertension 12/08/2017  ? Alcohol withdrawal ([place]) 07/21/2017  ? Lower urinary tract infectious disease 07/21/2017  ? Osteopenia 08/27/2016  ? Alcohol dependence with withdrawal ([place]) 01/27/2016  ? Generalized anxiety disorder 01/27/2016  ? Severe episode of recurrent major depressive disorder, without psychotic features ([place]) 01/27/2016  ? Major depressive disorder, recurrent episode, moderate ([place])   ? Alcohol use disorder, severe, dependence ([place]) 04/03/2014  ? Lumbosacral radiculopathy at L5 05/20/2013  ? Nonspecific elevation of levels of transaminase or lactic acid dehydrogenase (LDH) 03/16/2013  ? Hepatic steatosis 03/16/2013  ? Nausea and vomiting 03/16/2013  ? Chronic cholecystitis 03/14/2013  ? Cervical arthritis 10/12/2012  ? Palpitations 11/29/2010  ? SVT (supraventricular tachycardia) ([place]) 11/29/2010  ? Mitral valve prolapse 11/29/2010  ? Decreased libido 11/15/2010  ? GOITER, MULTINODULAR 08/17/2009  ? ANXIETY 08/17/2009  ? Migraine headache 08/17/2009  ? Hearing loss 08/17/2009  ? Osteoporosis 08/17/2009  ?  ASYMPTOMATIC POSTMENOPAUSAL STATUS 08/17/2009  ? DYSPHAGIA 06/19/2007  ? ?Home Medication(s) ?Prior to Admission medications   ?Medication Sig Start Date End Date Taking? Authorizing Provider  ?acetaminophen (TYLENOL) 650 MG CR tablet Take 650 mg  by mouth every 8 (eight) hours as needed for pain.    [provider]  ?albuterol (VENTOLIN HFA) 108 (90 Base) MCG/ACT inhaler Inhale 2 puffs into the lungs every 6 (six) hours as needed for wheezing or shortness of breath. 10/19/20   Ronnell Freshwater, NP  ?alendronate (FOSAMAX) 70 MG tablet Take 70 mg by mouth every Monday. Take with a full glass of water on an empty stomach.    [provider]  ?busPIRone (BUSPAR) 15 MG tablet Take 15 mg by mouth 2 (two) times daily.    [provider]  ?Calcium Carbonate-Vitamin D (CALCIUM-D PO) Take 1 tablet by mouth every morning.    [provider]  ?carvedilol (COREG) 3.125 MG tablet Take 1 tablet (3.125 mg total) by mouth 2 (two) times daily with a meal. 02/16/21   Elgergawy, Silver Huguenin, MD  ?chlordiazePOXIDE (LIBRIUM) 10 MG capsule Please use 10 mg oral 3 times daily for 1 day, then decrease to 10 mg oral twice daily for 3 days, then 10 mg oral daily for 3 days then stop. 02/16/21   Elgergawy, Silver Huguenin, MD  ?Cholecalciferol (VITAMIN D-3 PO) Take 1 tablet by mouth every morning.    [provider]  ?DULoxetine (CYMBALTA) 60 MG capsule TAKE 1 CAPSULE (60 MG TOTAL) BY MOUTH DAILY. ?Patient taking differently: Take 60 mg by mouth 2 (two) times daily. 10/23/14   Denita Lung, MD  ?enoxaparin (LOVENOX) 40 MG/0.4ML injection Inject 0.4 mLs (40 mg total) into the skin daily for 18 days. Give for 18 days then stop. 02/17/21 03/07/21  Elgergawy, Silver Huguenin, MD  ?gabapentin (NEURONTIN) 100 MG capsule Take 1 capsule (100 mg total) by mouth 3 (three) times daily. 05/05/21   Ronnell Freshwater, NP  ?hydrocortisone 2.5 % cream Apply 1 application topically 2 (two) times daily as needed (itching).    [provider]  ?hydrOXYzine (ATARAX/VISTARIL) 25 MG tablet Take 25 mg by mouth 2 (two) times daily.    [provider]  ?meloxicam (MOBIC) 15 MG tablet Take 1 tablet (15 mg total) by mouth daily as needed for pain. 06/02/21   Hyatt, Max  T, DPM  ?meloxicam (MOBIC) 7.5 MG tablet Take 1 tablet (7.5 mg total) by mouth daily. 05/05/21   Ronnell Freshwater, NP  ?Multiple Vitamin (MULTIVITAMIN WITH MINERALS) TABS tablet Take 1 tablet by mouth daily. 01/10/18   Cristal Ford, DO  ?oxyCODONE (OXY IR/ROXICODONE) 5 MG immediate release tablet Take 1 tablet (5 mg total) by mouth every 6 (six) hours as needed for moderate pain or severe pain. 02/16/21   Elgergawy, Silver Huguenin, MD  ?pantoprazole (PROTONIX) 40 MG tablet TAKE 1 TABLET BY MOUTH 2 TIMES DAILY. ?Patient taking differently: Take 40 mg by mouth every morning. 01/11/21   Lorrene Reid, PA-C  ?potassium chloride (KLOR-CON M) 10 MEQ tablet Take 2 tablets (20 mEq total) by mouth daily for 5 days. Please take for 5 days then stop. 02/16/21 02/21/21  Elgergawy, Silver Huguenin, MD  ?SUMAtriptan (IMITREX) 100 MG tablet TAKE 1 TABLET BY MOUTH AT THE FIRST SIGN OF MIGRAINE, MAY REPEAT IN 2 HOURS. MAX 2 TABLETS IN 24 HOURS 04/26/21   Lorrene Reid, PA-C  ?thiamine 100 MG tablet Take 1 tablet (100 mg total) by mouth daily.  02/17/21   Elgergawy, Silver Huguenin, MD  ?traZODone (DESYREL) 50 MG tablet Take 50 mg by mouth at bedtime as needed for sleep.    [provider]  ?triamcinolone cream (KENALOG) 0.1 % Apply 1 application topically 2 (two) times daily as needed (facial itching).    [provider]  ?                                                                                                                                  ?Past Surgical History ?Past Surgical History:  ?Procedure Laterality Date  ? BREAST BIOPSY Right   ? "benign"  ? CATARACT EXTRACTION W/ INTRAOCULAR LENS  IMPLANT, BILATERAL    ? CERVICAL CONE BIOPSY  2000s  ? CERVIX LESION DESTRUCTION  1991  ? "dysplasia; lesions"  ? CHOLECYSTECTOMY N/A 03/14/2013  ? Procedure: LAPAROSCOPIC CHOLECYSTECTOMY WITH ATTEMPTED INTRAOPERATIVE CHOLANGIOGRAM;  Surgeon: Harl Bowie, MD;  Location: [facility];  Service: General;  Laterality: N/A;  ?  laporoscopic abdominal surgery    ? "for endometriosis"  ? LEFT HEART CATH AND CORONARY ANGIOGRAPHY N/A 05/07/2018  ? Procedure: LEFT HEART CATH AND CORONARY ANGIOGRAPHY;  Surgeon: Belva Crome, MD;  Location: [facility]

## 2021-06-08 NOTE — ED Provider Notes (Signed)
66 year old lady presenting to the emergency room with concern for alcohol withdrawal.  Has history of prior alcohol withdrawal syndrome, normally drinks a bottle of wine daily.  Last drink this morning.  On arrival to ER patient anxious, tachycardic, hypertensive.  Given 2 mg IV Ativan.  Basic labs pending, she reported falling, CT head, C-spine pending, T and L-spine imaging pending. ? ?I reviewed basic labs, grossly stable.  CT and x-ray imaging stable.  Reassessed patient, started CIWA protocol order set.  RN to give 2 mg IV Ativan more for now. ? ?Discussed case with hospitalist service who will admit. ?  ?Lucrezia Starch, MD ?06/08/21 2218 ? ?

## 2021-06-08 NOTE — H&P (Signed)
?History and Physical  ? ? ?Terri Ayala HBZ:[id number] DOB: February 20, 1956 DOA: 06/08/2021 ? ?PCP: Lorrene Reid, PA-C  ?Patient coming from: home ? ?I have personally briefly reviewed patient's old medical records in [place] ? ?Chief Complaint: alcohol withdrawal ? ?HPI: Terri Ayala is a 66 y.o. female with medical history significant of alcoholism, anxiety/depression,HTN, SVT,GERD, osteoporosis who presents to ed seeking medically supervised detox due to withdrawal symptoms. Patient states that her last drink was this am but notes since then she has been tremulous with diffuse body aches and generally feeling unwell. She also endorses fall yesterday with trauma to her head, and back.  ON further ros patient states she had had n/v/ no diarrhea, or abdominal pain , no dysuria or urinary frequency, mild sob but no chest pain. She note being unstable due to history of left ankle fracture. She also notes intermittent pain associated with healing fracture. ? ? ?ED Course:  ?Afeb, bp 149/96, hr 132, rr 24 sat 100%  ?EKG: sinus tach ?Labs: ?NA139, K 4.6, bicarb 14, cr 0.76 AG 25 ?Cbc 9.5, hgb 13.7,plt 198 ?Tx ativan 60mgiv x 1, LR 1L ?cxrY lumbar, thoracic spine,  Neg ?See full report below ?Review of Systems: As per HPI otherwise 10 point review of systems negative.  ? ?Past Medical History:  ?Diagnosis Date  ? [place] ([place] 11/2017; 01/04/2018  ? Alcoholism ([facility]   ? Anxiety   ? Arthritis   ? "hands" (01/04/2018)  ? ASYMPTOMATIC POSTMENOPAUSAL STATUS 08/17/2009  ? Chronic lower back pain   ? Depression   ? DYSPHAGIA 06/19/2007  ? Fatty liver, alcoholic   ? GERD (gastroesophageal reflux disease)   ? GOITER, MULTINODULAR 08/17/2009  ? Hyperlipidemia   ? Migraines   ? "not sure what triggers them; I'll have 1 q couple weeks or more; come 2 days in a row when they come" (01/04/2018)  ? Mitral valve prolapse   ? OSTEOPOROSIS 08/17/2009  ? PONV (postoperative nausea and vomiting)   ? Supraventricular tachycardia  ([place]   ? ? ?Past Surgical History:  ?Procedure Laterality Date  ? BREAST BIOPSY Right   ? "benign"  ? CATARACT EXTRACTION W/ INTRAOCULAR LENS  IMPLANT, BILATERAL    ? CERVICAL CONE BIOPSY  2000s  ? CERVIX LESION DESTRUCTION  1991  ? "dysplasia; lesions"  ? CHOLECYSTECTOMY N/A 03/14/2013  ? Procedure: LAPAROSCOPIC CHOLECYSTECTOMY WITH ATTEMPTED INTRAOPERATIVE CHOLANGIOGRAM;  Surgeon: DHarl Bowie MD;  [facility]  Service: General;  Laterality: N/A;  ? laporoscopic abdominal surgery    ? "for endometriosis"  ? LEFT HEART CATH AND CORONARY ANGIOGRAPHY N/A 05/07/2018  ? Procedure: LEFT HEART CATH AND CORONARY ANGIOGRAPHY;  Surgeon: SBelva Crome MD;  [facility];  Service: Cardiovascular;  Laterality: N/A;  ? ORIF ANKLE FRACTURE Left 02/11/2021  ? Procedure: OPEN REDUCTION INTERNAL FIXATION (ORIF) ANKLE FRACTURE;  Surgeon: H[place]amese [place] MD;  Location: [facility]  Service: Orthopedics;  Laterality: Left;  ? ? ? reports that she quit smoking about 47 years ago. Her smoking use included cigarettes. She has a 7.00 pack-year smoking history. She has never used smokeless tobacco. She reports current alcohol use of about 28.0 standard drinks per week. She reports that she does not use drugs. ? ?Allergies  ?Allergen Reactions  ? Doxycycline Swelling  ?  Mouth swelling and sores  ? Codeine Itching  ? Tetracycline Hcl Swelling  ?  Mouth swelling and sores  ? ? ?Family History  ?Problem  Relation Age of Onset  ? Colon cancer Father   ?     Deceased, 22  ? Osteoporosis Mother   ?     Living, 78  ? Healthy Brother   ? Healthy Brother   ? Healthy Son   ? Healthy Son   ? Thyroid disease Neg Hx   ? Goiter Neg Hx   ? ? ?Prior to Admission medications   ?Medication Sig Start Date End Date Taking? Authorizing Provider  ?acetaminophen (TYLENOL) 650 MG CR tablet Take 650 mg by mouth every 8 (eight) hours as needed for pain.    [provider]  ?albuterol (VENTOLIN HFA) 108 (90 Base) MCG/ACT inhaler Inhale  2 puffs into the lungs every 6 (six) hours as needed for wheezing or shortness of breath. 10/19/20   Ronnell Freshwater, NP  ?alendronate (FOSAMAX) 70 MG tablet Take 70 mg by mouth every Monday. Take with a full glass of water on an empty stomach.    [provider]  ?busPIRone (BUSPAR) 15 MG tablet Take 15 mg by mouth 2 (two) times daily.    [provider]  ?Calcium Carbonate-Vitamin D (CALCIUM-D PO) Take 1 tablet by mouth every morning.    [provider]  ?carvedilol (COREG) 3.125 MG tablet Take 1 tablet (3.125 mg total) by mouth 2 (two) times daily with a meal. 02/16/21   Elgergawy, Silver Huguenin, MD  ?chlordiazePOXIDE (LIBRIUM) 10 MG capsule Please use 10 mg oral 3 times daily for 1 day, then decrease to 10 mg oral twice daily for 3 days, then 10 mg oral daily for 3 days then stop. 02/16/21   Elgergawy, Silver Huguenin, MD  ?Cholecalciferol (VITAMIN D-3 PO) Take 1 tablet by mouth every morning.    [provider]  ?DULoxetine (CYMBALTA) 60 MG capsule TAKE 1 CAPSULE (60 MG TOTAL) BY MOUTH DAILY. ?Patient taking differently: Take 60 mg by mouth 2 (two) times daily. 10/23/14   Denita Lung, MD  ?enoxaparin (LOVENOX) 40 MG/0.4ML injection Inject 0.4 mLs (40 mg total) into the skin daily for 18 days. Give for 18 days then stop. 02/17/21 03/07/21  Elgergawy, Silver Huguenin, MD  ?gabapentin (NEURONTIN) 100 MG capsule Take 1 capsule (100 mg total) by mouth 3 (three) times daily. 05/05/21   Ronnell Freshwater, NP  ?hydrocortisone 2.5 % cream Apply 1 application topically 2 (two) times daily as needed (itching).    [provider]  ?hydrOXYzine (ATARAX/VISTARIL) 25 MG tablet Take 25 mg by mouth 2 (two) times daily.    [provider]  ?meloxicam (MOBIC) 15 MG tablet Take 1 tablet (15 mg total) by mouth daily as needed for pain. 06/02/21   Hyatt, Max T, DPM  ?meloxicam (MOBIC) 7.5 MG tablet Take 1 tablet (7.5 mg total) by mouth daily. 05/05/21   Ronnell Freshwater, NP  ?Multiple Vitamin  (MULTIVITAMIN WITH MINERALS) TABS tablet Take 1 tablet by mouth daily. 01/10/18   Cristal Ford, DO  ?oxyCODONE (OXY IR/ROXICODONE) 5 MG immediate release tablet Take 1 tablet (5 mg total) by mouth every 6 (six) hours as needed for moderate pain or severe pain. 02/16/21   Elgergawy, Silver Huguenin, MD  ?pantoprazole (PROTONIX) 40 MG tablet TAKE 1 TABLET BY MOUTH 2 TIMES DAILY. ?Patient taking differently: Take 40 mg by mouth every morning. 01/11/21   Lorrene Reid, PA-C  ?potassium chloride (KLOR-CON M) 10 MEQ tablet Take 2 tablets (20 mEq total) by mouth daily for 5 days. Please take for 5 days then stop.  02/16/21 02/21/21  Elgergawy, Silver Huguenin, MD  ?SUMAtriptan (IMITREX) 100 MG tablet TAKE 1 TABLET BY MOUTH AT THE FIRST SIGN OF MIGRAINE, MAY REPEAT IN 2 HOURS. MAX 2 TABLETS IN 24 HOURS 04/26/21   Lorrene Reid, PA-C  ?thiamine 100 MG tablet Take 1 tablet (100 mg total) by mouth daily. 02/17/21   Elgergawy, Silver Huguenin, MD  ?traZODone (DESYREL) 50 MG tablet Take 50 mg by mouth at bedtime as needed for sleep.    [provider]  ?triamcinolone cream (KENALOG) 0.1 % Apply 1 application topically 2 (two) times daily as needed (facial itching).    [provider]  ? ? ?Physical Exam: ?Vitals:  ? 06/08/21 1642 06/08/21 1700 06/08/21 1803 06/08/21 1830  ?BP: (!) 154/94 (!) 146/84 (!) 154/86 (!) 147/90  ?Pulse: (!) 134 (!) 136 (!) 136 (!) 139  ?Resp:  (!) 25 (!) 23 17  ?Temp:      ?TempSrc:      ?SpO2:  97% 98% 99%  ?Weight:      ?Height:      ? ? ? ?Vitals:  ? 06/08/21 1642 06/08/21 1700 06/08/21 1803 06/08/21 1830  ?BP: (!) 154/94 (!) 146/84 (!) 154/86 (!) 147/90  ?Pulse: (!) 134 (!) 136 (!) 136 (!) 139  ?Resp:  (!) 25 (!) 23 17  ?Temp:      ?TempSrc:      ?SpO2:  97% 98% 99%  ?Weight:      ?Height:      ?Constitutional: NAD, anxious, +diaphoresis  ?Eyes: PERRL, lids and conjunctivae normal ?ENMT: Mucous membranes are dry. Posterior pharynx clear of any exudate or lesions.Normal dentition.  ?Neck: normal,  supple, no masses, no thyromegaly ?Respiratory: clear to auscultation bilaterally, no wheezing, no crackles. Normal respiratory effort. No accessory muscle use.  ?Cardiovascular tachycardic regularrhythm, no m

## 2021-06-08 NOTE — ED Triage Notes (Signed)
Pt BIB EMS from home for alcohol withdrawal. Last drink this AM, reports drinking a bottle of wine a day. Pt reports knee, back, and ankle pain from past ankle fx. Also reports multiple falls in past couple days. Home aid reports that medication box was filled for the pt and no meds have been taken from the box.  ?

## 2021-06-08 NOTE — ED Notes (Signed)
Patient CIWA is 49 , MD Marcello Moores at bedside . Plan is medicate. Continuing monitoring.  ?

## 2021-06-08 NOTE — ED Notes (Signed)
Lactate 2.0 , MD Marcello Moores made aware  ?

## 2021-06-08 NOTE — ED Notes (Signed)
IV team at bedside 

## 2021-06-09 DIAGNOSIS — F10231 Alcohol dependence with withdrawal delirium: Secondary | ICD-10-CM | POA: Diagnosis not present

## 2021-06-09 LAB — CBC
HCT: 38.1 % (ref 36.0–46.0)
Hemoglobin: 12.7 g/dL (ref 12.0–15.0)
MCH: 31.4 pg (ref 26.0–34.0)
MCHC: 33.3 g/dL (ref 30.0–36.0)
MCV: 94.1 fL (ref 80.0–100.0)
Platelets: 164 10*3/uL (ref 150–400)
RBC: 4.05 MIL/uL (ref 3.87–5.11)
RDW: 15.7 % — ABNORMAL HIGH (ref 11.5–15.5)
WBC: 7 10*3/uL (ref 4.0–10.5)
nRBC: 0 % (ref 0.0–0.2)

## 2021-06-09 LAB — COMPREHENSIVE METABOLIC PANEL
ALT: 15 U/L (ref 0–44)
AST: 34 U/L (ref 15–41)
Albumin: 3.9 g/dL (ref 3.5–5.0)
Alkaline Phosphatase: 64 U/L (ref 38–126)
Anion gap: 10 (ref 5–15)
BUN: 17 mg/dL (ref 8–23)
CO2: 24 mmol/L (ref 22–32)
Calcium: 8.5 mg/dL — ABNORMAL LOW (ref 8.9–10.3)
Chloride: 105 mmol/L (ref 98–111)
Creatinine, Ser: 0.75 mg/dL (ref 0.44–1.00)
GFR, Estimated: >60 mL/min (ref >60)
Glucose, Bld: 96 mg/dL (ref 70–99)
Potassium: 4 mmol/L (ref 3.5–5.1)
Sodium: 139 mmol/L (ref 135–145)
Total Bilirubin: 1.4 mg/dL — ABNORMAL HIGH (ref 0.3–1.2)
Total Protein: 6.7 g/dL (ref 6.5–8.1)

## 2021-06-09 LAB — CBG MONITORING, ED: Glucose-Capillary: 103 mg/dL — ABNORMAL HIGH (ref 70–99)

## 2021-06-09 LAB — LACTIC ACID, PLASMA: Lactic Acid, Venous: 0.9 mmol/L (ref 0.5–1.9)

## 2021-06-09 MED ORDER — SODIUM CHLORIDE 0.9 % IV SOLN: INTRAVENOUS | Status: DC

## 2021-06-09 MED ORDER — PANTOPRAZOLE SODIUM 40 MG PO TBEC
40.0000 mg | DELAYED_RELEASE_TABLET | Freq: Two times a day (BID) | ORAL | Status: DC
Start: 2021-06-09 — End: 2021-06-11
  Administered 2021-06-09 – 2021-06-11 (×4): 40 mg via ORAL
  Filled 2021-06-09 (×4): qty 1

## 2021-06-09 MED ORDER — DULOXETINE HCL 60 MG PO CPEP
60.0000 mg | ORAL_CAPSULE | Freq: Two times a day (BID) | ORAL | Status: DC
  Administered 2021-06-09 – 2021-06-11 (×4): 60 mg via ORAL
  Filled 2021-06-09 (×4): qty 1

## 2021-06-09 MED ORDER — CARVEDILOL 3.125 MG PO TABS
3.1250 mg | ORAL_TABLET | Freq: Two times a day (BID) | ORAL | Status: DC
  Administered 2021-06-09 – 2021-06-11 (×4): 3.125 mg via ORAL
  Filled 2021-06-09 (×4): qty 1

## 2021-06-09 MED ORDER — GABAPENTIN 100 MG PO CAPS
100.0000 mg | ORAL_CAPSULE | Freq: Three times a day (TID) | ORAL | Status: DC
  Administered 2021-06-09 – 2021-06-10 (×3): 100 mg via ORAL
  Filled 2021-06-09 (×3): qty 1

## 2021-06-09 MED ORDER — NORETHINDRONE-ETH ESTRADIOL 1-5 MG-MCG PO TABS: 1.0000 | ORAL_TABLET | Freq: Every day | ORAL | Status: DC

## 2021-06-09 MED ORDER — IBUPROFEN 400 MG PO TABS
200.0000 mg | ORAL_TABLET | Freq: Four times a day (QID) | ORAL | Status: DC | PRN
  Administered 2021-06-09: 200 mg via ORAL
  Filled 2021-06-09: qty 1

## 2021-06-09 MED ORDER — BUSPIRONE HCL 5 MG PO TABS
15.0000 mg | ORAL_TABLET | Freq: Two times a day (BID) | ORAL | Status: DC
  Administered 2021-06-09 – 2021-06-11 (×4): 15 mg via ORAL
  Filled 2021-06-09 (×4): qty 3

## 2021-06-09 MED ORDER — SENNOSIDES-DOCUSATE SODIUM 8.6-50 MG PO TABS
2.0000 | ORAL_TABLET | Freq: Every day | ORAL | Status: DC
  Administered 2021-06-09 – 2021-06-10 (×2): 2 via ORAL
  Filled 2021-06-09 (×2): qty 2

## 2021-06-09 MED ORDER — ALBUTEROL SULFATE (2.5 MG/3ML) 0.083% IN NEBU: 2.5000 mg | INHALATION_SOLUTION | Freq: Four times a day (QID) | RESPIRATORY_TRACT | Status: DC | PRN

## 2021-06-09 NOTE — ED Notes (Signed)
Pt resting at this time with eyes closed, easy RR, no acute distress  ?

## 2021-06-09 NOTE — ED Notes (Signed)
Pt aroused from sleep to tactile stimulation. Pt able to answer questions and reoriented to situation/time. Pt follows commands  ?

## 2021-06-09 NOTE — Progress Notes (Signed)
?   06/09/21 1751  ?Assess: MEWS Score  ?Temp 98.6 ?F (37 ?C)  ?BP (!) 148/94  ?Pulse Rate (!) 113  ?ECG Heart Rate (!) 113  ?Resp 18  ?Level of Consciousness Alert  ?SpO2 99 %  ?O2 Device Room Air  ?Assess: MEWS Score  ?MEWS Temp 0  ?MEWS Systolic 0  ?MEWS Pulse 2  ?MEWS RR 0  ?MEWS LOC 0  ?MEWS Score 2  ?MEWS Score Color Yellow  ?Assess: if the MEWS score is Yellow or Red  ?Were vital signs taken at a resting state? Yes  ?Focused Assessment No change from prior assessment  ?Early Detection of Sepsis Score *See Row Information* Low  ?MEWS guidelines implemented *See Row Information* Yes  ?Treat  ?MEWS Interventions Escalated (See documentation below)  ?Pain Scale 0-10  ?Pain Score Asleep  ?Take Vital Signs  ?Increase Vital Sign Frequency  Yellow: Q 2hr X 2 then Q 4hr X 2, if remains yellow, continue Q 4hrs  ?Escalate  ?MEWS: Escalate Yellow: discuss with charge nurse/RN and consider discussing with provider and RRT  ?Notify: Charge Nurse/RN  ?Name of Charge Nurse/RN Notified Elwyn Lade RN  ?Date Charge Nurse/RN Notified 06/09/21  ?Time Charge Nurse/RN Notified 1755  ?Notify: Provider  ?Provider Name/Title Dr. Laurena Bering  ?Date Provider Notified 06/09/21  ?Time Provider Notified 1800  ?Notification Type Page ?(secure chat)  ?Notification Reason Other (Comment) ?(yellow mews)  ?Document  ?Progress note created (see row info) Yes  ? ? ?

## 2021-06-09 NOTE — Progress Notes (Signed)
Pt arrived to the floor around 1615 via stretcher by ED nurse. Pt amb from stretcher to hospital bed with unsteady gait. Pt alert and oriented x4 in no acute distress. VSS but HR elevated in the 110s. Respirations even and unlabored on room air. Assessment completed. Bed in low position. Call bell within reach.  ?

## 2021-06-09 NOTE — ED Notes (Signed)
Patient is asleep in no acute distress breathing spontaneously to room air .  ?

## 2021-06-09 NOTE — Progress Notes (Signed)
?PROGRESS NOTE ? ? ? ?Terri Ayala  ERD:[id number] DOB: [birth year] DOA: 06/08/2021 ?PCP: Lorrene Reid, PA-C  ? ? ?Brief Narrative:  ?66 year old with history of alcoholism, anxiety/depression, hypertension, GERD, recent ankle fracture, multiple hospitalizations related to alcoholism and withdrawal symptoms presented back to emergency room seeking medically supervised detox due to withdrawal symptoms.  She drank before she coming to the hospital.  She has whole body ache and feeling unwell.  Activity withdrawal in the ER, admitted.   ? ?Assessment & Plan: ?  ?Alcohol abuse with alcohol withdrawal symptoms: ?High risk of DVT. ?Continues to remain in withdrawals, currently hemodynamically stable. ?Maintenance IV fluids, multivitamins and thiamine's.  Electrolytes replacement. ?Continue on high-dose Ativan to prevent from DTs. ?Case management to evaluate home safety, living condition. ?Every time she feels better goes home and goes back to drink, she may likely benefit with residential rehab after detox.  We will explore possibilities with her when she is more able to communicate. ? ?Metabolic acidosis: Due to alcoholic ketoacidosis.  Improving with IV fluid.  Continue maintenance IV fluids today. ? ?GERD: On PPI. ? ?Anxiety/depression: On SSRI.  Resume. ? ?Essential hypertension: Blood pressure stable. ? ? ?DVT prophylaxis: enoxaparin (LOVENOX) injection 40 mg Start: 06/08/21 2200 ? ? ?Code Status: Full code ?Family Communication: None available ?Disposition Plan: Status is: Inpatient ?Remains inpatient appropriate because: Significant alcohol withdrawal ?  ? ? ?Consultants:  ?None ? ?Procedures:  ?None ? ?Antimicrobials:  ?None ? ? ?Subjective: ?Patient seen and examined.  Resting the emergency room.  Remains tachycardic with heart rate 115-125.  Blood pressure stable.  Received dose of Ativan at 6 AM in the morning, however since then she has been slightly confused but not requiring more Ativan. ?Patient was  sleepy, minimally interactive and unable to keep up with conversation. ? ?Objective: ?Vitals:  ? 06/09/21 0600 06/09/21 0700 06/09/21 0830 06/09/21 0915  ?BP: (!) 170/102 (!) 158/88 (!) 150/82 135/77  ?Pulse: (!) 122 (!) 121 (!) 119 (!) 115  ?Resp: (!) 26 (!) '21 20 19  '$ ?Temp:      ?TempSrc:      ?SpO2: 96% 96% 94% 97%  ?Weight:      ?Height:      ? ?No intake or output data in the 24 hours ending 06/09/21 1144 ?Filed Weights  ? 06/08/21 1430  ?Weight: 65.8 kg  ? ? ?Examination: ? ?General: Drowsy.  Sleepy.  Unable to keep up conversation.  Follows simple commands.  Alert and awake on stimulation. ?Cardiovascular: S1-S2 normal.  Regular rate rhythm.  Tachycardic.  No added sounds. ?Respiratory: Bilateral clear.  No added sounds. ?Gastrointestinal: Soft.  Nontender.  Bowel sound present. ?Ext: No edema or cyanosis. ?Neuro: Mostly sleepy.  Moves all extremities.  She has some resting tremors but no obvious asterixis.  Denies any delusions or hallucinations. ?Musculoskeletal: No deformities. ? ? ? ? ? ?Data Reviewed: I have personally reviewed following labs and imaging studies ? ?CBC: ?Recent Labs  ?Lab 06/08/21 ?1613 06/08/21 ?2114 06/08/21 ?2132 06/09/21 ?0443  ?WBC 9.5 9.8  --  7.0  ?NEUTROABS 7.4  --   --   --   ?HGB 13.7 13.1 14.3 12.7  ?HCT 40.1 40.1 42.0 38.1  ?MCV 92.2 94.6  --  94.1  ?PLT 198 181  --  164  ? ?Basic Metabolic Panel: ?Recent Labs  ?Lab 06/08/21 ?1444 06/08/21 ?2114 06/08/21 ?2132 06/09/21 ?0443  ?NA 139  --  140 139  ?K 4.6  --  4.0 4.0  ?  CL 100  --   --  105  ?CO2 14*  --   --  24  ?GLUCOSE 89  --   --  96  ?BUN 15  --   --  17  ?CREATININE 0.76 0.77  --  0.75  ?CALCIUM 9.7  --   --  8.5*  ?MG  --  2.1  --   --   ? ?GFR: ?Estimated Creatinine Clearance: 61.6 mL/min (by C-G formula based on SCr of 0.75 mg/dL). ?Liver Function Tests: ?Recent Labs  ?Lab 06/08/21 ?1444 06/09/21 ?0443  ?AST 70* 34  ?ALT 19 15  ?ALKPHOS 91 64  ?BILITOT 1.4* 1.4*  ?PROT 9.2* 6.7  ?ALBUMIN 5.4* 3.9  ? ?No results for  input(s): LIPASE, AMYLASE in the last 168 hours. ?No results for input(s): AMMONIA in the last 168 hours. ?Coagulation Profile: ?Recent Labs  ?Lab 06/08/21 ?1612  ?INR 1.0  ? ?Cardiac Enzymes: ?Recent Labs  ?Lab 06/08/21 ?2114  ?CKTOTAL 133  ? ?BNP (last 3 results) ?No results for input(s): PROBNP in the last 8760 hours. ?HbA1C: ?No results for input(s): HGBA1C in the last 72 hours. ?CBG: ?Recent Labs  ?Lab 06/09/21 ?0936  ?GLUCAP 103*  ? ?Lipid Profile: ?No results for input(s): CHOL, HDL, LDLCALC, TRIG, CHOLHDL, LDLDIRECT in the last 72 hours. ?Thyroid Function Tests: ?Recent Labs  ?  06/08/21 ?2114  ?TSH 3.332  ? ?Anemia Panel: ?No results for input(s): VITAMINB12, FOLATE, FERRITIN, TIBC, IRON, RETICCTPCT in the last 72 hours. ?Sepsis Labs: ?Recent Labs  ?Lab 06/08/21 ?2114 06/09/21 ?0443  ?LATICACIDVEN 2.0* 0.9  ? ? ?No results found for this or any previous visit (from the past 240 hour(s)).  ? ? ? ? ? ?Radiology Studies: ?DG Thoracic Spine 2 View ? ?Result Date: 06/08/2021 ?CLINICAL DATA:  Low back pain after fall EXAM: THORACIC SPINE 2 VIEWS COMPARISON:  03/27/2020 FINDINGS: Normal alignment and preserved vertebral body heights. No compression fracture, wedge-shaped deformity or focal kyphosis. Minor diffuse endplate sclerosis and bony spurring. Normal paraspinal soft tissues. Normal appearing pedicles. Included chest unremarkable. IMPRESSION: No acute abnormality Electronically Signed   By: Jerilynn Mages.  Shick M.D.   On: 06/08/2021 17:54  ? ?DG Lumbar Spine Complete ? ?Result Date: 06/08/2021 ?CLINICAL DATA:  Low back pain after fall EXAM: LUMBAR SPINE - COMPLETE 4+ VIEW COMPARISON:  02/10/2021 CT lumbar spine FINDINGS: Normal alignment. Minor scattered endplate degenerative changes. Disc space narrowing most pronounced at L5-S1. Preserved vertebral body heights. No acute compression fracture, wedge-shaped deformity or focal kyphosis. No visualized pars defects. Normal pedicles and SI joints for age. IMPRESSION: Minor  diffuse degenerative changes. No acute finding by plain radiography Electronically Signed   By: Jerilynn Mages.  Shick M.D.   On: 06/08/2021 17:52  ? ?CT Head Wo Contrast ? ?Result Date: 06/08/2021 ?CLINICAL DATA:  Multiple falls EXAM: CT HEAD WITHOUT CONTRAST TECHNIQUE: Contiguous axial images were obtained from the base of the skull through the vertex without intravenous contrast. RADIATION DOSE REDUCTION: This exam was performed according to the departmental dose-optimization program which includes automated exposure control, adjustment of the mA and/or kV according to patient size and/or use of iterative reconstruction technique. COMPARISON:  CT head 02/10/2021 FINDINGS: Brain: There is no acute intracranial hemorrhage, extra-axial fluid collection, or acute infarct. The ventricles are stable in size. Patchy hypodensity in the subcortical and periventricular white matter again likely reflects sequela of chronic white matter microangiopathy. A calcification in the left cerebellar hemisphere is unchanged, nonspecific. There is no solid mass lesion. There  is no mass effect or midline shift. Vascular: No hyperdense vessel or unexpected calcification. Skull: Normal. Negative for fracture or focal lesion. Sinuses/Orbits: The imaged paranasal sinuses are clear. Bilateral lens implants are in place. The globes and orbits are otherwise unremarkable. Other: None. IMPRESSION: No acute intracranial pathology. Electronically Signed   By: Valetta Mole M.D.   On: 06/08/2021 15:20  ? ?CT Cervical Spine Wo Contrast ? ?Result Date: 06/08/2021 ?CLINICAL DATA:  Multiple falls EXAM: CT CERVICAL SPINE WITHOUT CONTRAST TECHNIQUE: Multidetector CT imaging of the cervical spine was performed without intravenous contrast. Multiplanar CT image reconstructions were also generated. RADIATION DOSE REDUCTION: This exam was performed according to the departmental dose-optimization program which includes automated exposure control, adjustment of the mA and/or  kV according to patient size and/or use of iterative reconstruction technique. COMPARISON:  Cervical spine CT 02/10/2021 FINDINGS: Alignment: There is grade 1 anterolisthesis of C4 on C5 and C5 on C6, likely

## 2021-06-10 DIAGNOSIS — F10231 Alcohol dependence with withdrawal delirium: Secondary | ICD-10-CM | POA: Diagnosis not present

## 2021-06-10 LAB — GLUCOSE, CAPILLARY: Glucose-Capillary: 79 mg/dL (ref 70–99)

## 2021-06-10 MED ORDER — GABAPENTIN 100 MG PO CAPS
200.0000 mg | ORAL_CAPSULE | Freq: Three times a day (TID) | ORAL | Status: DC
  Administered 2021-06-10 – 2021-06-11 (×3): 200 mg via ORAL
  Filled 2021-06-10 (×3): qty 2

## 2021-06-10 MED ORDER — IBUPROFEN 600 MG PO TABS: 600.0000 mg | ORAL_TABLET | Freq: Four times a day (QID) | ORAL | Status: DC | PRN

## 2021-06-10 MED ORDER — SODIUM CHLORIDE 0.9 % IV BOLUS
500.0000 mL | Freq: Once | INTRAVENOUS | Status: AC
  Administered 2021-06-10: 500 mL via INTRAVENOUS

## 2021-06-10 MED ORDER — TRAZODONE HCL 50 MG PO TABS
50.0000 mg | ORAL_TABLET | Freq: Every evening | ORAL | Status: DC | PRN
  Administered 2021-06-10: 50 mg via ORAL
  Filled 2021-06-10: qty 1

## 2021-06-10 MED ORDER — LORAZEPAM 1 MG PO TABS
1.0000 mg | ORAL_TABLET | Freq: Once | ORAL | Status: AC
Start: 2021-06-10 — End: 2021-06-10
  Administered 2021-06-10: 1 mg via ORAL
  Filled 2021-06-10: qty 1

## 2021-06-10 NOTE — Progress Notes (Signed)
?PROGRESS NOTE ? ? ? ?Terri Ayala  VEH:[id number] DOB: [birth year] DOA: 06/08/2021 ?PCP: Terri Reid, PA-C  ? ? ?Brief Narrative:  ?66 year old with history of alcoholism, anxiety/depression, hypertension, GERD, recent left ankle fracture, multiple hospitalizations related to alcoholism and withdrawal symptoms presented back to emergency room seeking medically supervised detox due to withdrawal symptoms.  She drank before she coming to the hospital.  She has whole body ache and feeling unwell.  Activity withdrawal in the ER, admitted.   ?States she went back to drinking as she is not able to relief her pain on her ankle. ? ?Assessment & Plan: ?  ?Alcohol abuse with alcohol withdrawal symptoms: ?High risk of DVT.  Stabilizing today.  Currently neurologically and hemodynamically stable. ?Discontinue maintenance IV fluids.  Continue multivitamins and thiamine's.  Electrolytes are adequate.   ?Continue Ativan doses to prevent from DTs.   ?Case management to evaluate home safety, living condition and alcohol cessation resources. ? ?Metabolic acidosis: Due to alcoholic ketoacidosis.  Improving with IV fluid.  Discontinue. ? ?GERD: On PPI. ? ?Anxiety/depression: On SSRI.  Resume. ? ?Essential hypertension: Blood pressure stable. ? ?Recent left ankle fracture/chronic pain syndrome: Patient on gabapentin 100 mg 3 times daily, increased to 200 mg 3 times daily.  We will add scheduled ibuprofen 600 mg every 6 hours after meal to relieve pain.  Mobilize.  Avoid opiates. ? ? ?DVT prophylaxis: enoxaparin (LOVENOX) injection 40 mg Start: 06/08/21 2200 ? ? ?Code Status: Full code ?Family Communication: None  ?Disposition Plan: Status is: Inpatient ?Remains inpatient appropriate because: Significant alcohol withdrawal ?  ? ? ?Consultants:  ?None ? ?Procedures:  ?None ? ?Antimicrobials:  ?None ? ? ?Subjective: ? ?Patient seen and examined.  More awake.  Had used 2 doses of Ativan overnight.  Denies any nausea or vomiting. ?Patient  tells me that she has to go back to drink because her left ankle pain is persistent and she gets no help with pain relief. ?She tells me she is going to quit drinking alcohol if she gets good pain control.  We discussed about doing gabapentin and increasing dose of ibuprofen she is agreeable. ? ?Objective: ?Vitals:  ? 06/10/21 0422 06/10/21 0500 06/10/21 0600 06/10/21 0815  ?BP:   (!) 152/94 122/74  ?Pulse: 99 (!) 116 (!) 101   ?Resp: '18 19 17   '$ ?Temp:    98.4 ?F (36.9 ?C)  ?TempSrc:    Axillary  ?SpO2: 96% 96% 95%   ?Weight:      ?Height:      ? ? ?Intake/Output Summary (Last 24 hours) at 06/10/2021 1148 ?Last data filed at 06/10/2021 0348 ?Gross per 24 hour  ?Intake 1585 ml  ?Output 250 ml  ?Net 1335 ml  ? ?Filed Weights  ? 06/08/21 1430 06/09/21 1624  ?Weight: 65.8 kg 66.5 kg  ? ? ?Examination: ? ?General: Looks fairly comfortable sitting in chair.  Frail and debilitated.  On room air. ?Mildly anxious.  Some resting tremors present.  Denies any delusions or hallucinations. ?Cardiovascular: S1-S2 normal.  Regular rate rhythm.  No added sounds. ?Respiratory: Bilateral clear. ?Gastrointestinal: Soft.  Nontender. ?Ext: No edema or cyanosis.  No deformities. ?Neuro: Alert awake x4.  No focal deficits. ?Musculoskeletal: No deformities.  She has mild tenderness on range of motion of left ankle. ? ? ? ? ? ?Data Reviewed: I have personally reviewed following labs and imaging studies ? ?CBC: ?Recent Labs  ?Lab 06/08/21 ?1613 06/08/21 ?2114 06/08/21 ?2132 06/09/21 ?0443  ?WBC 9.5 9.8  --  7.0  ?NEUTROABS 7.4  --   --   --   ?HGB 13.7 13.1 14.3 12.7  ?HCT 40.1 40.1 42.0 38.1  ?MCV 92.2 94.6  --  94.1  ?PLT 198 181  --  164  ? ?Basic Metabolic Panel: ?Recent Labs  ?Lab 06/08/21 ?1444 06/08/21 ?2114 06/08/21 ?2132 06/09/21 ?0443  ?NA 139  --  140 139  ?K 4.6  --  4.0 4.0  ?CL 100  --   --  105  ?CO2 14*  --   --  24  ?GLUCOSE 89  --   --  96  ?BUN 15  --   --  17  ?CREATININE 0.76 0.77  --  0.75  ?CALCIUM 9.7  --   --  8.5*  ?MG   --  2.1  --   --   ? ?GFR: ?Estimated Creatinine Clearance: 61.9 mL/min (by C-G formula based on SCr of 0.75 mg/dL). ?Liver Function Tests: ?Recent Labs  ?Lab 06/08/21 ?1444 06/09/21 ?0443  ?AST 70* 34  ?ALT 19 15  ?ALKPHOS 91 64  ?BILITOT 1.4* 1.4*  ?PROT 9.2* 6.7  ?ALBUMIN 5.4* 3.9  ? ?No results for input(s): LIPASE, AMYLASE in the last 168 hours. ?No results for input(s): AMMONIA in the last 168 hours. ?Coagulation Profile: ?Recent Labs  ?Lab 06/08/21 ?1612  ?INR 1.0  ? ?Cardiac Enzymes: ?Recent Labs  ?Lab 06/08/21 ?2114  ?CKTOTAL 133  ? ?BNP (last 3 results) ?No results for input(s): PROBNP in the last 8760 hours. ?HbA1C: ?No results for input(s): HGBA1C in the last 72 hours. ?CBG: ?Recent Labs  ?Lab 06/09/21 ?0936 06/10/21 ?3710  ?GLUCAP 103* 79  ? ?Lipid Profile: ?No results for input(s): CHOL, HDL, LDLCALC, TRIG, CHOLHDL, LDLDIRECT in the last 72 hours. ?Thyroid Function Tests: ?Recent Labs  ?  06/08/21 ?2114  ?TSH 3.332  ? ?Anemia Panel: ?No results for input(s): VITAMINB12, FOLATE, FERRITIN, TIBC, IRON, RETICCTPCT in the last 72 hours. ?Sepsis Labs: ?Recent Labs  ?Lab 06/08/21 ?2114 06/09/21 ?0443  ?LATICACIDVEN 2.0* 0.9  ? ? ?No results found for this or any previous visit (from the past 240 hour(s)).  ? ? ? ? ? ?Radiology Studies: ?DG Thoracic Spine 2 View ? ?Result Date: 06/08/2021 ?CLINICAL DATA:  Low back pain after fall EXAM: THORACIC SPINE 2 VIEWS COMPARISON:  03/27/2020 FINDINGS: Normal alignment and preserved vertebral body heights. No compression fracture, wedge-shaped deformity or focal kyphosis. Minor diffuse endplate sclerosis and bony spurring. Normal paraspinal soft tissues. Normal appearing pedicles. Included chest unremarkable. IMPRESSION: No acute abnormality Electronically Signed   By: Jerilynn Mages.  Shick M.D.   On: 06/08/2021 17:54  ? ?DG Lumbar Spine Complete ? ?Result Date: 06/08/2021 ?CLINICAL DATA:  Low back pain after fall EXAM: LUMBAR SPINE - COMPLETE 4+ VIEW COMPARISON:  02/10/2021 CT  lumbar spine FINDINGS: Normal alignment. Minor scattered endplate degenerative changes. Disc space narrowing most pronounced at L5-S1. Preserved vertebral body heights. No acute compression fracture, wedge-shaped deformity or focal kyphosis. No visualized pars defects. Normal pedicles and SI joints for age. IMPRESSION: Minor diffuse degenerative changes. No acute finding by plain radiography Electronically Signed   By: Jerilynn Mages.  Shick M.D.   On: 06/08/2021 17:52  ? ?CT Head Wo Contrast ? ?Result Date: 06/08/2021 ?CLINICAL DATA:  Multiple falls EXAM: CT HEAD WITHOUT CONTRAST TECHNIQUE: Contiguous axial images were obtained from the base of the skull through the vertex without intravenous contrast. RADIATION DOSE REDUCTION: This exam was performed according to the departmental dose-optimization program which includes automated  exposure control, adjustment of the mA and/or kV according to patient size and/or use of iterative reconstruction technique. COMPARISON:  CT head 02/10/2021 FINDINGS: Brain: There is no acute intracranial hemorrhage, extra-axial fluid collection, or acute infarct. The ventricles are stable in size. Patchy hypodensity in the subcortical and periventricular white matter again likely reflects sequela of chronic white matter microangiopathy. A calcification in the left cerebellar hemisphere is unchanged, nonspecific. There is no solid mass lesion. There is no mass effect or midline shift. Vascular: No hyperdense vessel or unexpected calcification. Skull: Normal. Negative for fracture or focal lesion. Sinuses/Orbits: The imaged paranasal sinuses are clear. Bilateral lens implants are in place. The globes and orbits are otherwise unremarkable. Other: None. IMPRESSION: No acute intracranial pathology. Electronically Signed   By: Valetta Mole M.D.   On: 06/08/2021 15:20  ? ?CT Cervical Spine Wo Contrast ? ?Result Date: 06/08/2021 ?CLINICAL DATA:  Multiple falls EXAM: CT CERVICAL SPINE WITHOUT CONTRAST  TECHNIQUE: Multidetector CT imaging of the cervical spine was performed without intravenous contrast. Multiplanar CT image reconstructions were also generated. RADIATION DOSE REDUCTION: This exam was performed acc

## 2021-06-10 NOTE — Evaluation (Signed)
Occupational Therapy Evaluation Patient Details Name: Terri Ayala MRN: [id number] DOB: 1955/07/10 Today's Date: 06/10/2021   History of Present Illness 66 y/o female presented to ED on 06/08/21 for alcohol withdrawal and multiple falls. Admitted for metabolic acidosis 2/2 ETOH use. PMH: HTN, hx of SVT, ETOH abuse.   Clinical Impression   Pt admitted for concerns listed above. PTA pt reported that she was independent with functional mobility and most ADL's. Ppt reports that she has aides that assist with cooking, cleaning, medication management, and tub transfers. At this time, pt presents with increased weakness, balance deficits, decreased activity tolerance, and cognitive concerns. She is requiring min guard-min A for all OOB tasks and reliant on RW or furniture walking. Recommending pt have extended [facility] services to maximize her independence and safety at home. OT will follow acutely.      Recommendations for follow up therapy are one component of a multi-disciplinary discharge planning process, led by the attending physician.  Recommendations may be updated based on patient status, additional functional criteria and insurance authorization.   Follow Up Recommendations  Home health OT    Assistance Recommended at Discharge Intermittent Supervision/Assistance  Patient can return home with the following A little help with walking and/or transfers;A little help with bathing/dressing/bathroom;Assistance with cooking/housework;Direct supervision/assist for medications management;Direct supervision/assist for financial management;Assist for transportation;Help with stairs or ramp for entrance    Functional Status Assessment  Patient has had a recent decline in their functional status and demonstrates the ability to make significant improvements in function in a reasonable and predictable amount of time.  Equipment Recommendations  None recommended by OT    Recommendations for Other Services        Precautions / Restrictions Precautions Precautions: Fall Restrictions Weight Bearing Restrictions: No      Mobility Bed Mobility Overal bed mobility: Needs Assistance Bed Mobility: Supine to Sit     Supine to sit: Min guard     General bed mobility comments: Increased time, cuing, and use of bed rails    Transfers Overall transfer level: Needs assistance Equipment used: Rolling walker (2 wheels) Transfers: Sit to/from Stand Sit to Stand: Min guard           General transfer comment: Increased time to power up and steady, heavily reliant on RW      Balance Overall balance assessment: Needs assistance Sitting-balance support: Feet unsupported, No upper extremity supported Sitting balance-Leahy Scale: Good Sitting balance - Comments: No LoB, able to donn socks EOB with her feet unsupported   Standing balance support: Bilateral upper extremity supported, Single extremity supported, During functional activity Standing balance-Leahy Scale: Fair Standing balance comment: static balance fair, however for dynamic balance, pt needs RW or external support for safety                           ADL either performed or assessed with clinical judgement   ADL Overall ADL's : Needs assistance/impaired Eating/Feeding: Independent;Sitting   Grooming: Min guard;Standing   Upper Body Bathing: Independent;Sitting   Lower Body Bathing: Minimal assistance;Sitting/lateral leans;Sit to/from stand   Upper Body Dressing : Independent;Sitting   Lower Body Dressing: Min guard;Sitting/lateral leans;Sit to/from stand   Toilet Transfer: Min guard;Ambulation   Toileting- Clothing Manipulation and Hygiene: Min guard;Sitting/lateral lean;Sit to/from stand       Functional mobility during ADLs: Min guard;Rolling walker (2 wheels) General ADL Comments: Pt demonstrates increased weakness and balance deficits, requiring min guard for safety  and with certain dynamic standing  tasks, such as bathing, pt needs min A.     Vision Baseline Vision/History: 1 Wears glasses Ability to See in Adequate Light: 0 Adequate Patient Visual Report: No change from baseline Vision Assessment?: No apparent visual deficits     Perception     Praxis      Pertinent Vitals/Pain Pain Assessment Pain Assessment: Faces Pain Score: 3  Pain Location: L ankle Pain Descriptors / Indicators: Aching, Discomfort, Grimacing Pain Intervention(s): Monitored during session, Repositioned     Hand Dominance Right   Extremity/Trunk Assessment Upper Extremity Assessment Upper Extremity Assessment: Generalized weakness   Lower Extremity Assessment Lower Extremity Assessment: Defer to PT evaluation   Cervical / Trunk Assessment Cervical / Trunk Assessment: Normal   Communication Communication Communication: HOH   Cognition Arousal/Alertness: Awake/alert Behavior During Therapy: WFL for tasks assessed/performed Overall Cognitive Status: No family/caregiver present to determine baseline cognitive functioning                                 General Comments: Pt is going through withdrawals right now, was unaware she was laying in a saturated bed, slow to respond, otherwise followed all simple commands. O&Ax4     General Comments  VSS on RA    Exercises     Shoulder Instructions      Home Living Family/patient expects to be discharged to:: Private residence Living Arrangements: Alone Available Help at Discharge: Friend(s);Available PRN/intermittently Type of Home: House Home Access: Stairs to enter [employer] of Steps: 1 Entrance Stairs-Rails: Right;Left Home Layout: Able to live on main level with bedroom/bathroom;1/2 bath on main level;Multi-level     Bathroom Shower/Tub: Chief Strategy Officer: Standard     Home Equipment: Agricultural consultant (2 wheels);Cane - single point;BSC/3in1;Shower seat          Prior  Functioning/Environment Prior Level of Function : Independent/Modified Independent;History of Falls (last six months)             Mobility Comments: reports not using AD at home. Reports 2 falls in past 6 months. Was active with HHPT prior to admission ADLs Comments: Reports having company come out to manage medications and assist with getting in/out of tub        OT Problem List: Decreased strength;Decreased activity tolerance;Impaired balance (sitting and/or standing);Decreased cognition;Decreased safety awareness      OT Treatment/Interventions: Self-care/ADL training;Therapeutic exercise;Energy conservation;DME and/or AE instruction;Therapeutic activities;Patient/family education;Balance training;Cognitive remediation/compensation    OT Goals(Current goals can be found in the care plan section) Acute Rehab OT Goals Patient Stated Goal: To go home to her dog OT Goal Formulation: With patient Time For Goal Achievement: 06/24/21 Potential to Achieve Goals: Good ADL Goals Pt Will Perform Grooming: Independently;standing Pt Will Perform Lower Body Bathing: with modified independence;sitting/lateral leans;sit to/from stand Pt Will Perform Lower Body Dressing: with modified independence;sitting/lateral leans;sit to/from stand Pt Will Transfer to Toilet: with modified independence;ambulating Pt Will Perform Toileting - Clothing Manipulation and hygiene: with modified independence;sitting/lateral leans;sit to/from stand Additional ADL Goal #1: Pt will follow 3+ step commands 100% of the session with no verbal cuing needed.  OT Frequency: Min 2X/week    Co-evaluation              AM-PAC OT "6 Clicks" Daily Activity     Outcome Measure Help from another person eating meals?: None Help from another person taking care of personal grooming?: A  Little Help from another person toileting, which includes using toliet, bedpan, or urinal?: A Little Help from another person bathing  (including washing, rinsing, drying)?: A Little Help from another person to put on and taking off regular upper body clothing?: None Help from another person to put on and taking off regular lower body clothing?: A Little 6 Click Score: 20   End of Session Equipment Utilized During Treatment: Gait belt;Rolling walker (2 wheels) Nurse Communication: Mobility status  Activity Tolerance: Patient tolerated treatment well Patient left: in chair;with call bell/phone within reach;with chair alarm set  OT Visit Diagnosis: Unsteadiness on feet (R26.81);Other abnormalities of gait and mobility (R26.89);Muscle weakness (generalized) (M62.81)                Time: 1610-9604 OT Time Calculation (min): 37 min Charges:  OT General Charges $OT Visit: 1 Visit OT Evaluation $OT Eval Moderate Complexity: 1 Mod  Dontreal Miera H., OTR/L Acute Rehabilitation  Taira Knabe Elane Lux Meaders 06/10/2021, 10:45 AM

## 2021-06-10 NOTE — Evaluation (Signed)
Physical Therapy Evaluation ?Patient Details ?Name: Terri Ayala ?MRN: [id number] ?DOB: [birth year] ?Today's Date: 06/10/2021 ? ?History of Present Illness ? 66 y/o female presented to ED on 06/08/21 for alcohol withdrawal and multiple falls. Admitted for metabolic acidosis 2/2 ETOH use. PMH: HTN, hx of SVT, ETOH abuse.  ?Clinical Impression ? Patient admitted with the above. Patient presents with generalized weakness, impaired balance, decreased activity tolerance, and cognitive deficits. Patient required min guard for ambulation with use of RW and minA without AD while reaching for objects to stabilize. Patient with decreased insight into current situation and slow to process information. Patient will benefit from skilled PT services during acute stay to address listed deficits. Recommend HHPT at discharge to maximize functional mobility and safety.    ?   ? ?Recommendations for follow up therapy are one component of a multi-disciplinary discharge planning process, led by the attending physician.  Recommendations may be updated based on patient status, additional functional criteria and insurance authorization. ? ?Follow Up Recommendations Home health PT ? ?  ?Assistance Recommended at Discharge Intermittent Supervision/Assistance  ?Patient can return home with the following ? A little help with bathing/dressing/bathroom;Assistance with cooking/housework;Help with stairs or ramp for entrance;Assist for transportation ? ?  ?Equipment Recommendations None recommended by PT  ?Recommendations for Other Services ?    ?  ?Functional Status Assessment Patient has had a recent decline in their functional status and demonstrates the ability to make significant improvements in function in a reasonable and predictable amount of time.  ? ?  ?Precautions / Restrictions Precautions ?Precautions: Fall ?Restrictions ?Weight Bearing Restrictions: No  ? ?  ? ?Mobility ? Bed Mobility ?Overal bed mobility: Needs Assistance ?Bed Mobility:  Supine to Sit ?  ?  ?Supine to sit: Min guard ?  ?  ?General bed mobility comments: Increased time, cueing, and use of bed rails ?  ? ?Transfers ?Overall transfer level: Needs assistance ?Equipment used: Rolling Jaeline Whobrey (2 wheels) ?Transfers: Sit to/from Stand ?Sit to Stand: Min guard ?  ?  ?  ?  ?  ?General transfer comment: Increased time to power up and steady, heavily reliant on RW ?  ? ?Ambulation/Gait ?Ambulation/Gait assistance: Min guard, Min assist ?Gait Distance (Feet): 10 Feet (+15') ?Assistive device: Rolling Jiro Kiester (2 wheels), None ?Gait Pattern/deviations: Step-through pattern, Decreased stride length, Drifts right/left ?Gait velocity: decreased ?  ?  ?General Gait Details: initially min guard with use of RW. When ambulating with no AD, patient reaching for objects for stability and minA for balance ? ?Stairs ?  ?  ?  ?  ?  ? ?Wheelchair Mobility ?  ? ?Modified Rankin (Stroke Patients Only) ?  ? ?  ? ?Balance Overall balance assessment: Needs assistance ?Sitting-balance support: Feet unsupported, No upper extremity supported ?Sitting balance-Leahy Scale: Good ?Sitting balance - Comments: No LoB, able to donn socks EOB with her feet unsupported ?  ?Standing balance support: Bilateral upper extremity supported, Single extremity supported, During functional activity ?Standing balance-Leahy Scale: Fair ?Standing balance comment: static balance fair, however for dynamic balance, pt needs RW or external support for safety ?  ?  ?  ?  ?  ?  ?  ?  ?  ?  ?  ?   ? ? ? ?Pertinent Vitals/Pain Pain Assessment ?Pain Assessment: Faces ?Faces Pain Scale: Hurts little more ?Pain Location: L ankle ?Pain Descriptors / Indicators: Aching, Discomfort, Grimacing ?Pain Intervention(s): Monitored during session, Repositioned  ? ? ?Home Living Family/patient expects to be discharged to::  Private residence ?Living Arrangements: Alone ?Available Help at Discharge: Friend(s);Available PRN/intermittently ?Type of Home: House ?Home  Access: Stairs to enter ?Entrance Stairs-Rails: Right;Left ?Entrance Stairs-Number of Steps: 1 ?  ?Home Layout: Able to live on main level with bedroom/bathroom;1/2 bath on main level;Multi-level ?Home Equipment: Conservation officer, nature (2 wheels);Cane - single point;BSC/3in1;Shower seat ?   ?  ?Prior Function Prior Level of Function : Independent/Modified Independent;History of Falls (last six months) ?  ?  ?  ?  ?  ?  ?Mobility Comments: reports not using AD at home. Reports 2 falls in past 6 months. Was active with HHPT prior to admission ?ADLs Comments: Reports having a company come out to manage medications and assist with getting in/out of tub ?  ? ? ?Hand Dominance  ? Dominant Hand: Right ? ?  ?Extremity/Trunk Assessment  ? Upper Extremity Assessment ?Upper Extremity Assessment: Defer to OT evaluation ?  ? ?Lower Extremity Assessment ?Lower Extremity Assessment: Generalized weakness (recent hx of L ankle fx 01/2021 with limited ROM and pain) ?  ? ?Cervical / Trunk Assessment ?Cervical / Trunk Assessment: Normal  ?Communication  ? Communication: HOH  ?Cognition Arousal/Alertness: Awake/alert ?Behavior During Therapy: [facility] for tasks assessed/performed ?Overall Cognitive Status: No family/caregiver present to determine baseline cognitive functioning ?  ?  ?  ?  ?  ?  ?  ?  ?  ?  ?  ?  ?  ?  ?  ?  ?General Comments: Pt is going through withdrawals right now, was unaware she was laying in a saturated bed, slow to respond, otherwise followed all simple commands. O&Ax4 ?  ?  ? ?  ?General Comments General comments (skin integrity, edema, etc.): VSS on RA ? ?  ?Exercises    ? ?Assessment/Plan  ?  ?PT Assessment Patient needs continued PT services  ?PT Problem List Decreased strength;Decreased activity tolerance;Decreased balance;Decreased mobility;Decreased cognition;Decreased knowledge of use of DME;Decreased safety awareness;Decreased knowledge of precautions ? ?   ?  ?PT Treatment Interventions Gait training;DME  instruction;Stair training;Functional mobility training;Therapeutic activities;Therapeutic exercise;Balance training;Patient/family education   ? ?PT Goals (Current goals can be found in the Care Plan section)  ?Acute Rehab PT Goals ?Patient Stated Goal: to go home ?PT Goal Formulation: With patient ?Time For Goal Achievement: 06/24/21 ?Potential to Achieve Goals: Good ? ?  ?Frequency Min 3X/week ?  ? ? ?Co-evaluation   ?  ?  ?  ?  ? ? ?  ?AM-PAC PT "6 Clicks" Mobility  ?Outcome Measure Help needed turning from your back to your side while in a flat bed without using bedrails?: A Little ?Help needed moving from lying on your back to sitting on the side of a flat bed without using bedrails?: A Little ?Help needed moving to and from a bed to a chair (including a wheelchair)?: A Little ?Help needed standing up from a chair using your arms (e.g., wheelchair or bedside chair)?: A Little ?Help needed to walk in hospital room?: A Little ?Help needed climbing 3-5 steps with a railing? : A Little ?6 Click Score: 18 ? ?  ?End of Session Equipment Utilized During Treatment: Gait belt ?Activity Tolerance: Patient tolerated treatment well ?Patient left: in chair;with call bell/phone within reach;with chair alarm set ?Nurse Communication: Mobility status;Other (comment) (IV leaking) ?PT Visit Diagnosis: Unsteadiness on feet (R26.81);Muscle weakness (generalized) (M62.81);History of falling (Z91.81);Difficulty in walking, not elsewhere classified (R26.2) ?  ? ?Time: 9628-3662 ?PT Time Calculation (min) (ACUTE ONLY): 37 min ? ? ?Charges:   PT  Evaluation ?$PT Eval Moderate Complexity: 1 Mod ?  ?  ?   ? ? ?Finn Altemose A. Gilford Rile, PT, DPT ?Acute Rehabilitation Services ?Pager [phone] ?Office [phone] ? ? ?Tayven Renteria A Quinnie Barcelo ?06/10/2021, 11:01 AM ? ?

## 2021-06-11 DIAGNOSIS — F10231 Alcohol dependence with withdrawal delirium: Secondary | ICD-10-CM | POA: Diagnosis not present

## 2021-06-11 LAB — GLUCOSE, CAPILLARY: Glucose-Capillary: 94 mg/dL (ref 70–99)

## 2021-06-11 MED ORDER — GABAPENTIN 100 MG PO CAPS: 200.0000 mg | ORAL_CAPSULE | Freq: Three times a day (TID) | ORAL | 1 refills | Status: DC

## 2021-06-11 MED ORDER — METOPROLOL TARTRATE 5 MG/5ML IV SOLN
5.0000 mg | Freq: Three times a day (TID) | INTRAVENOUS | Status: DC | PRN
  Administered 2021-06-11: 5 mg via INTRAVENOUS
  Filled 2021-06-11: qty 5

## 2021-06-11 NOTE — TOC Progression Note (Signed)
Transition of Care (TOC) - Progression Note  ? ? ?Patient Details  ?Name: Terri Ayala ?MRN: [id number] ?Date of Birth: 12-02-1955 ? ?Transition of Care (TOC) CM/SW Contact  ?Zia [place], LCSW ?Phone Number: ?06/11/2021, 12:50 PM ? ?Clinical Narrative:    ? ?Notified by [facility] and RN that pt needing cab voucher at home. Pt does not have family to assist or money for cab. Needs HH and is not appropriate for bus and bus does not go to her address. CSW provided RN with Cab voucher. RN arranging [facility] taxi.  ? ?Expected Discharge Plan: [place] ?Barriers to Discharge: No Barriers Identified ? ?Expected Discharge Plan and Services ?Expected Discharge Plan: [place] ?  ?Discharge Planning Services: CM Consult ?Post Acute Care Choice: Home Health, Resumption of Svcs/PTA Provider ?Living arrangements for the past 2 months: [place] ?Expected Discharge Date: 06/11/21               ?  ?DME Agency: NA ?  ?  ?  ?HH Arranged: PT, OT ?[place] Agency: [place] ?Date HH Agency Contacted: 06/11/21 ?Time [place]: 7078 ?Representative spoke with at [place]: [place] ? ? ?Social Determinants of Health (SDOH) Interventions ?  ? ?Readmission Risk Interventions ?   ? View : No data to display.  ?  ?  ?  ? ? ?

## 2021-06-11 NOTE — Progress Notes (Signed)
Pt taken to main entrance via wheelchair to be transported home by [employer]. Pt has taken all of her belongings with her.  ?

## 2021-06-11 NOTE — Discharge Summary (Signed)
Physician Discharge Summary  ?Terri Ayala EGB:[id number] DOB: 1955-11-06 DOA: 06/08/2021 ? ?PCP: Lorrene Reid, PA-C ? ?Admit date: 06/08/2021 ?Discharge date: 06/11/2021 ? ?Admitted From: Home ?Disposition: Home with home health ? ?Recommendations for Outpatient Follow-up:  ?Follow up with PCP in 1-2 weeks ?Continue to keep up with AA meetings, outpatient follow-ups. ? ?Home Health: PT/OT ?Equipment/Devices: None ? ?Discharge Condition: Stable ?CODE STATUS: Full code ?Diet recommendation: Low-salt diet ? ?Discharge summary: ?66 year old with history of alcoholism, anxiety/depression, hypertension, GERD, recent left ankle fracture, multiple hospitalizations related to alcoholism and withdrawal symptoms presented back to emergency room seeking medically supervised detox due to withdrawal symptoms.  She drank before she coming to the hospital.  She had whole body ache and feeling unwell.  States she went back to drinking as she is not able to relief her pain on her ankle. ?  ?# Alcohol abuse with alcohol withdrawal symptoms: ?Admitted to hospital and treated with benzodiazepines multivitamins with relief of symptoms. ?No use of as needed benzodiazepine for the last 24 hours.  Neurologically and hemodynamically stable today.  Electrolytes are adequate. ?She will resume her outpatient follow-up, AA meetings and agreeable to quit drinking. ?  ?GERD: On PPI. ?  ?Anxiety/depression: On SSRI.  Resume. ?  ?Essential hypertension: Blood pressure stable. ?  ?Recent left ankle fracture/chronic pain syndrome: Patient on gabapentin 100 mg 3 times daily, increased to 200 mg 3 times daily.  ?Advised to continue ibuprofen and local topical NSAIDs, avoid opiates.  ?She was able to mobilize with PT OT, weightbearing as tolerated. ? ?Stable for discharge.  Very high risk of readmission given ongoing alcoholism ? ? ? ?Discharge Diagnoses:  ?Principal Problem: ?  Alcohol withdrawal delirium, acute, mixed level of activity  ([place]) ? ? ? ?Discharge Instructions ? ?Discharge Instructions   ? ? Call MD for:  persistant dizziness or light-headedness   Complete by: As directed ?  ? Call MD for:  severe uncontrolled pain   Complete by: As directed ?  ? Diet - low sodium heart healthy   Complete by: As directed ?  ? Increase activity slowly   Complete by: As directed ?  ? ?  ? ?Allergies as of 06/11/2021   ? ?   Reactions  ? Doxycycline Swelling  ? Mouth swelling and sores  ? Codeine Itching  ? Tetracycline Hcl Swelling  ? Mouth swelling and sores  ? ?  ? ?  ?Medication List  ?  ? ?STOP taking these medications   ? ?enoxaparin 40 MG/0.4ML injection ?Commonly known as: LOVENOX ?  ?potassium chloride 10 MEQ tablet ?Commonly known as: KLOR-CON M ?  ?propranolol 10 MG tablet ?Commonly known as: INDERAL ?  ? ?  ? ?TAKE these medications   ? ?albuterol 108 (90 Base) MCG/ACT inhaler ?Commonly known as: VENTOLIN HFA ?Inhale 2 puffs into the lungs every 6 (six) hours as needed for wheezing or shortness of breath. ?  ?alendronate 70 MG tablet ?Commonly known as: FOSAMAX ?Take 70 mg by mouth every Monday. ?  ?busPIRone 15 MG tablet ?Commonly known as: BUSPAR ?Take 15 mg by mouth 2 (two) times daily. ?  ?CALCIUM-D PO ?Take 1 tablet by mouth every morning. ?  ?carvedilol 3.125 MG tablet ?Commonly known as: COREG ?Take 1 tablet (3.125 mg total) by mouth 2 (two) times daily with a meal. ?  ?DULoxetine 60 MG capsule ?Commonly known as: CYMBALTA ?TAKE 1 CAPSULE (60 MG TOTAL) BY MOUTH DAILY. ?What changed: when to take this ?  ?  fluticasone 50 MCG/ACT nasal spray ?Commonly known as: FLONASE ?Place 2 sprays into both nostrils daily. ?  ?folic acid 1 MG tablet ?Commonly known as: FOLVITE ?Take 1 mg by mouth daily. ?  ?gabapentin 100 MG capsule ?Commonly known as: Neurontin ?Take 2 capsules (200 mg total) by mouth 3 (three) times daily. ?What changed: how much to take ?  ?hydrocortisone 2.5 % cream ?Apply 1 application topically 2 (two) times daily as needed  (itching). ?  ?hydrOXYzine 25 MG tablet ?Commonly known as: ATARAX ?Take 25 mg by mouth 2 (two) times daily. ?  ?ibuprofen 200 MG tablet ?Commonly known as: ADVIL ?Take 200 mg by mouth every 6 (six) hours as needed for headache. ?  ?Jinteli 1-5 MG-MCG Tabs tablet ?Generic drug: norethindrone-ethinyl estradiol ?Take 1 tablet by mouth daily. ?  ?meloxicam 7.5 MG tablet ?Commonly known as: MOBIC ?Take 1 tablet (7.5 mg total) by mouth daily. ?  ?multivitamin with minerals Tabs tablet ?Take 1 tablet by mouth daily. ?  ?pantoprazole 40 MG tablet ?Commonly known as: PROTONIX ?TAKE 1 TABLET BY MOUTH 2 TIMES DAILY. ?  ?Senna Plus 8.6-50 MG tablet ?Generic drug: senna-docusate ?Take 2 tablets by mouth at bedtime. ?  ?SUMAtriptan 100 MG tablet ?Commonly known as: IMITREX ?TAKE 1 TABLET BY MOUTH AT THE FIRST SIGN OF MIGRAINE, MAY REPEAT IN 2 HOURS. MAX 2 TABLETS IN 24 HOURS ?What changed: See the new instructions. ?  ?thiamine 100 MG tablet ?Take 1 tablet (100 mg total) by mouth daily. ?  ?traZODone 50 MG tablet ?Commonly known as: DESYREL ?Take 50 mg by mouth at bedtime as needed for sleep. ?  ?VITAMIN D-3 PO ?Take 1 tablet by mouth every morning. ?  ? ?  ? ? ?Allergies  ?Allergen Reactions  ? Doxycycline Swelling  ?  Mouth swelling and sores  ? Codeine Itching  ? Tetracycline Hcl Swelling  ?  Mouth swelling and sores  ? ? ?Consultations: ?None ? ? ?Procedures/Studies: ?DG Thoracic Spine 2 View ? ?Result Date: 06/08/2021 ?CLINICAL DATA:  Low back pain after fall EXAM: THORACIC SPINE 2 VIEWS COMPARISON:  03/27/2020 FINDINGS: Normal alignment and preserved vertebral body heights. No compression fracture, wedge-shaped deformity or focal kyphosis. Minor diffuse endplate sclerosis and bony spurring. Normal paraspinal soft tissues. Normal appearing pedicles. Included chest unremarkable. IMPRESSION: No acute abnormality Electronically Signed   By: Jerilynn Mages.  Shick M.D.   On: 06/08/2021 17:54  ? ?DG Lumbar Spine Complete ? ?Result Date:  06/08/2021 ?CLINICAL DATA:  Low back pain after fall EXAM: LUMBAR SPINE - COMPLETE 4+ VIEW COMPARISON:  02/10/2021 CT lumbar spine FINDINGS: Normal alignment. Minor scattered endplate degenerative changes. Disc space narrowing most pronounced at L5-S1. Preserved vertebral body heights. No acute compression fracture, wedge-shaped deformity or focal kyphosis. No visualized pars defects. Normal pedicles and SI joints for age. IMPRESSION: Minor diffuse degenerative changes. No acute finding by plain radiography Electronically Signed   By: Jerilynn Mages.  Shick M.D.   On: 06/08/2021 17:52  ? ?CT Head Wo Contrast ? ?Result Date: 06/08/2021 ?CLINICAL DATA:  Multiple falls EXAM: CT HEAD WITHOUT CONTRAST TECHNIQUE: Contiguous axial images were obtained from the base of the skull through the vertex without intravenous contrast. RADIATION DOSE REDUCTION: This exam was performed according to the departmental dose-optimization program which includes automated exposure control, adjustment of the mA and/or kV according to patient size and/or use of iterative reconstruction technique. COMPARISON:  CT head 02/10/2021 FINDINGS: Brain: There is no acute intracranial hemorrhage, extra-axial fluid collection, or acute infarct.  The ventricles are stable in size. Patchy hypodensity in the subcortical and periventricular white matter again likely reflects sequela of chronic white matter microangiopathy. A calcification in the left cerebellar hemisphere is unchanged, nonspecific. There is no solid mass lesion. There is no mass effect or midline shift. Vascular: No hyperdense vessel or unexpected calcification. Skull: Normal. Negative for fracture or focal lesion. Sinuses/Orbits: The imaged paranasal sinuses are clear. Bilateral lens implants are in place. The globes and orbits are otherwise unremarkable. Other: None. IMPRESSION: No acute intracranial pathology. Electronically Signed   By: Valetta Mole M.D.   On: 06/08/2021 15:20  ? ?CT Cervical Spine Wo  Contrast ? ?Result Date: 06/08/2021 ?CLINICAL DATA:  Multiple falls EXAM: CT CERVICAL SPINE WITHOUT CONTRAST TECHNIQUE: Multidetector CT imaging of the cervical spine was performed without intravenous contrast. Multiplanar CT imag

## 2021-06-11 NOTE — Progress Notes (Signed)
Pt will be discharge when cab is arranged. CSW is working on that now. ?

## 2021-06-11 NOTE — Progress Notes (Signed)
Pt's home aid stated she saw EMTs log in pt's medications when she was picked up the other day. This nurse informed pt that if EMTs logged medications in while at pt's home then more than likely medications are still at pt's home.  ? ?[facility] taxi has been called to transport pt home.  ? ?Discharge paperwork reviewed with pt. Pt verbalized understanding. Pt alert and oriented upon discharge. Pt has gathered all belongings to take with her.  ?

## 2021-06-11 NOTE — TOC Transition Note (Addendum)
Transition of Care (TOC) - CM/SW Discharge Note ? ? ?Patient Details  ?Name: Terri Ayala ?MRN: [id number] ?Date of Birth: 07/31/1955 ? ?Transition of Care (TOC) CM/SW Contact:  ?Zenon Mayo, RN ?Phone Number: ?06/11/2021, 10:29 AM ? ? ?Clinical Narrative:    ?Patient is for dc today, NCM offered choice, she is active with [facility] for [place], she would like to continue with them.  NCM confirmed with Marjory Lies with [facility].  Soc will begin 24 to 48 hrs post dc.  Patient states she will need ast with transportation.  [facility] CSW will ast her with transport. Patient has hospital follow up apt on AV[facility].  ? ? ?Final next level of care: [place] ?Barriers to Discharge: No Barriers Identified ? ? ?Patient Goals and CMS Choice ?Patient states their goals for this hospitalization and ongoing recovery are:: return home ?CMS Medicare.gov Compare Post Acute Care list provided to:: Patient ?Choice offered to / list presented to : Patient ? ?Discharge Placement ?  ?           ?  ?  ?  ?  ? ?Discharge Plan and Services ?  ?Discharge Planning Services: CM Consult ?Post Acute Care Choice: Home Health, Resumption of Svcs/PTA Provider          ?  ?DME Agency: NA ?  ?  ?  ?HH Arranged: PT, OT ?[place] Agency: [place] ?Date HH Agency Contacted: 06/11/21 ?Time [place]: 1696 ?Representative spoke with at [place]: [place] ? ?Social Determinants of Health (SDOH) Interventions ?  ? ? ?Readmission Risk Interventions ?   ? View : No data to display.  ?  ?  ?  ? ? ? ? ? ?

## 2021-06-11 NOTE — Care Management Important Message (Signed)
Important Message ? ?Patient Details  ?Name: Terri Ayala ?MRN: [id number] ?Date of Birth: December 21, 1955 ? ? ?Medicare Important Message Given:  Yes ? ?Patient left prior to IM delivery will mail IM to the patient home address.  ? ? ?Ajeet Casasola ?06/11/2021, 1:52 PM ?

## 2021-06-14 ENCOUNTER — Telehealth: Payer: Self-pay | Admitting: Physician Assistant

## 2021-06-14 NOTE — Telephone Encounter (Signed)
Called back [name] from [facility] to give her verbal order confirmation for PT from PCP.  ?

## 2021-06-14 NOTE — Telephone Encounter (Signed)
Terri Ayala with [facility] home [facility] wants to get an ok on orders for patient to have PT. Please call [phone] ?

## 2021-06-16 ENCOUNTER — Ambulatory Visit (INDEPENDENT_AMBULATORY_CARE_PROVIDER_SITE_OTHER): Payer: Medicare Other | Admitting: Physician Assistant

## 2021-06-16 ENCOUNTER — Encounter: Payer: Self-pay | Admitting: Physician Assistant

## 2021-06-16 VITALS — BP 130/84 | HR 90 | Temp 97.9°F | Ht 60.0 in | Wt 144.0 lb

## 2021-06-16 DIAGNOSIS — F331 Major depressive disorder, recurrent, moderate: Secondary | ICD-10-CM | POA: Diagnosis not present

## 2021-06-16 DIAGNOSIS — F1020 Alcohol dependence, uncomplicated: Secondary | ICD-10-CM

## 2021-06-16 DIAGNOSIS — I10 Essential (primary) hypertension: Secondary | ICD-10-CM

## 2021-06-16 DIAGNOSIS — F10930 Alcohol use, unspecified with withdrawal, uncomplicated: Secondary | ICD-10-CM

## 2021-06-16 DIAGNOSIS — Z09 Encounter for follow-up examination after completed treatment for conditions other than malignant neoplasm: Secondary | ICD-10-CM | POA: Diagnosis not present

## 2021-06-16 DIAGNOSIS — F411 Generalized anxiety disorder: Secondary | ICD-10-CM

## 2021-06-16 NOTE — Progress Notes (Signed)
? ?                                          Established Patient Office Visit ? ?Subjective   ?Patient ID: Terri Ayala, female    DOB: [birth year]  Age: 66 y.o. MRN: [id number] ? ?Chief Complaint  ?Patient presents with  ? Hospitalization Follow-up  ? ? ?HPI ?Patient presents for hospital follow-up. Since July 2022 patient has had 3 hospital admissions for alcohol withdrawal syndrome. Patient reports most recent admission was related to drinking alcohol to cope with her ankle pain since she wasn't able to get pain medication. Patient states wants assistance to help with alcohol cessation. Patient is followed by [facility] every 3 months. ? ?Discharge summary: ?Admit date: 06/08/2021 ?Discharge date: 06/11/2021 ?  ?Admitted From: Home ?Disposition: Home with home health ?  ?Recommendations for Outpatient Follow-up:  ?Follow up with PCP in 1-2 weeks ?Continue to keep up with AA meetings, outpatient follow-ups. ?  ?Home Health: PT/OT ?Equipment/Devices: None ?  ?Discharge Condition: Stable ?CODE STATUS: Full code ?Diet recommendation: Low-salt diet ?  ?Discharge summary: ?66 year old with history of alcoholism, anxiety/depression, hypertension, GERD, recent left ankle fracture, multiple hospitalizations related to alcoholism and withdrawal symptoms presented back to emergency room seeking medically supervised detox due to withdrawal symptoms.  She drank before she coming to the hospital.  She had whole body ache and feeling unwell.  States she went back to drinking as she is not able to relief her pain on her ankle. ?  ?# Alcohol abuse with alcohol withdrawal symptoms: ?Admitted to hospital and treated with benzodiazepines multivitamins with relief of symptoms. ?No use of as needed benzodiazepine for the last 24 hours.  Neurologically and hemodynamically stable today.  Electrolytes are adequate. ?She will resume her outpatient follow-up, AA meetings and agreeable to quit drinking. ?  ?GERD: On PPI. ?  ?Anxiety/depression: On  SSRI.  Resume. ?  ?Essential hypertension: Blood pressure stable. ?  ?Recent left ankle fracture/chronic pain syndrome: Patient on gabapentin 100 mg 3 times daily, increased to 200 mg 3 times daily.  ?Advised to continue ibuprofen and local topical NSAIDs, avoid opiates.  ?She was able to mobilize with PT OT, weightbearing as tolerated. ?  ?Stable for discharge.  Very high risk of readmission given ongoing alcoholism ?  ?  ?  ?Discharge Diagnoses:  ?Principal Problem: ?  Alcohol withdrawal delirium, acute, mixed level of activity ([place]) ?  ?  ? ?Patient Active Problem List  ? Diagnosis Date Noted  ? Alcohol withdrawal delirium, acute, mixed level of activity ([place]) 06/08/2021  ? Rosacea 05/17/2021  ? Dermatitis 05/17/2021  ? Seborrheic keratosis 05/17/2021  ? Melanocytic nevi of trunk 05/17/2021  ? Chronic pain of left ankle 05/12/2021  ? Body mass index 26.0-26.9, adult 05/12/2021  ? Ankle fracture, left, closed, initial encounter 02/10/2021  ? L1 vertebral fracture ([place]) 02/10/2021  ? Thyroid nodule 02/10/2021  ? Prolonged QT interval 02/10/2021  ? Elevated cholesterol 10/19/2020  ? Pedal edema 10/19/2020  ? History of colonic polyps 03/25/2020  ? Hemorrhage of rectum and anus 03/25/2020  ? Gastroparesis 03/25/2020  ? Gastroesophageal reflux disease 03/25/2020  ? Flatulence, eructation and gas pain 03/25/2020  ? Family history of malignant neoplasm of gastrointestinal tract 03/25/2020  ? Eosinophilic esophagitis 02/58/5277  ? Constipation 03/25/2020  ? Change in bowel habit 03/25/2020  ? Hospital discharge follow-up 04/04/2019  ?  Metabolic acidosis 84/69/6295  ? Abnormal liver function 02/06/2019  ? Alcohol withdrawal delirium ([place]) 02/06/2019  ? Elevated LFTs 07/12/2018  ? Healthcare maintenance 06/26/2018  ? Rib fracture 01/04/2018  ? Chest pain 12/08/2017  ? Leukocytosis 12/08/2017  ? Essential hypertension 12/08/2017  ? Alcohol withdrawal ([place]) 07/21/2017  ? Lower urinary tract infectious disease 07/21/2017  ?  Osteopenia 08/27/2016  ? Alcohol dependence with withdrawal ([place]) 01/27/2016  ? Generalized anxiety disorder 01/27/2016  ? Severe episode of recurrent major depressive disorder, without psychotic features ([place]) 01/27/2016  ? Major depressive disorder, recurrent episode, moderate ([place])   ? Alcohol use disorder, severe, dependence ([place]) 04/03/2014  ? Lumbosacral radiculopathy at L5 05/20/2013  ? Nonspecific elevation of levels of transaminase or lactic acid dehydrogenase (LDH) 03/16/2013  ? Hepatic steatosis 03/16/2013  ? Nausea and vomiting 03/16/2013  ? Chronic cholecystitis 03/14/2013  ? Cervical arthritis 10/12/2012  ? Palpitations 11/29/2010  ? SVT (supraventricular tachycardia) ([place]) 11/29/2010  ? Mitral valve prolapse 11/29/2010  ? Decreased libido 11/15/2010  ? GOITER, MULTINODULAR 08/17/2009  ? ANXIETY 08/17/2009  ? Migraine headache 08/17/2009  ? Hearing loss 08/17/2009  ? Osteoporosis 08/17/2009  ? ASYMPTOMATIC POSTMENOPAUSAL STATUS 08/17/2009  ? DYSPHAGIA 06/19/2007  ? ?Past Medical History:  ?Diagnosis Date  ? Alcohol withdrawal ([place]) 11/2017; 01/04/2018  ? Alcoholism ([place])   ? Anxiety   ? Arthritis   ? "hands" (01/04/2018)  ? ASYMPTOMATIC POSTMENOPAUSAL STATUS 08/17/2009  ? Chronic lower back pain   ? Depression   ? DYSPHAGIA 06/19/2007  ? Fatty liver, alcoholic   ? GERD (gastroesophageal reflux disease)   ? GOITER, MULTINODULAR 08/17/2009  ? Hyperlipidemia   ? Migraines   ? "not sure what triggers them; I'll have 1 q couple weeks or more; come 2 days in a row when they come" (01/04/2018)  ? Mitral valve prolapse   ? OSTEOPOROSIS 08/17/2009  ? PONV (postoperative nausea and vomiting)   ? Supraventricular tachycardia ([place])   ? ?Past Surgical History:  ?Procedure Laterality Date  ? BREAST BIOPSY Right   ? "benign"  ? CATARACT EXTRACTION W/ INTRAOCULAR LENS  IMPLANT, BILATERAL    ? CERVICAL CONE BIOPSY  2000s  ? CERVIX LESION DESTRUCTION  1991  ? "dysplasia; lesions"  ? CHOLECYSTECTOMY N/A 03/14/2013  ? Procedure:  LAPAROSCOPIC CHOLECYSTECTOMY WITH ATTEMPTED INTRAOPERATIVE CHOLANGIOGRAM;  Surgeon: Harl Bowie, MD;  Location: [facility];  Service: General;  Laterality: N/A;  ? laporoscopic abdominal surgery    ? "for endometriosis"  ? LEFT HEART CATH AND CORONARY ANGIOGRAPHY N/A 05/07/2018  ? Procedure: LEFT HEART CATH AND CORONARY ANGIOGRAPHY;  Surgeon: Belva Crome, MD;  Location: [facility];  Service: Cardiovascular;  Laterality: N/A;  ? ORIF ANKLE FRACTURE Left 02/11/2021  ? Procedure: OPEN REDUCTION INTERNAL FIXATION (ORIF) ANKLE FRACTURE;  Surgeon: Altamese [place] Dix, MD;  Location: [facility];  Service: Orthopedics;  Laterality: Left;  ? ?Social History  ? ?Tobacco Use  ? Smoking status: Former  ?  Packs/day: 1.00  ?  Years: 7.00  ?  Pack years: 7.00  ?  Types: Cigarettes  ?  Quit date: 02/28/1974  ?  Years since quitting: 47.3  ? Smokeless tobacco: Never  ? Tobacco comments:  ?  01/04/2018 "smoked when I was a teenager"  ?Vaping Use  ? Vaping Use: Never used  ?Substance Use Topics  ? Alcohol use: Yes  ?  Alcohol/week: 28.0 standard drinks  ?  Types: 28 Glasses of wine per week  ?  Comment:  01/04/2018 "1 1/2 bottles of wine/day"  ? Drug use: Never  ? ?Allergies  ?Allergen Reactions  ? Doxycycline Swelling  ?  Mouth swelling and sores  ? Codeine Itching  ? Tetracycline Hcl Swelling  ?  Mouth swelling and sores  ? ?  ? ?ROS ?Review of Systems:  ?A fourteen system review of systems was performed and found to be positive as per HPI. ? ?  ?Objective:  ?  ? ?BP 130/84   Pulse 90   Temp 97.9 ?F (36.6 ?C)   Ht 5' (1.524 m)   Wt 144 lb (65.3 kg)   SpO2 98%   BMI 28.12 kg/m?  ?BP Readings from Last 3 Encounters:  ?06/16/21 130/84  ?06/11/21 (!) 141/98  ?05/05/21 (!) 155/83  ? ?Wt Readings from Last 3 Encounters:  ?06/16/21 144 lb (65.3 kg)  ?06/09/21 146 lb 9.7 oz (66.5 kg)  ?05/17/21 145 lb (65.8 kg)  ? ? ?Physical Exam ?General:  poor hygiene, non-diaphoretic, in no acute distress   ?Neuro:  Alert and oriented,  extra-ocular  muscles intact  ?HEENT:  Normocephalic, atraumatic, neck supple  ?Skin:  no gross rash, warm, pink. ?Cardiac:  RRR, S1 S2 ?Respiratory: CTA B/L  ?Vascular:  Ext warm, no cyanosis apprec.; cap RF less 2 sec.

## 2021-06-16 NOTE — Patient Instructions (Signed)
Alcohol Abuse and Dependence Information, Adult ?Alcohol is a widely available drug. People drink alcohol in different amounts. People who drink alcohol very often and in large amounts often have problems during and after drinking. They may develop what is called an alcohol use disorder. There are two main types of alcohol use disorders: ?Alcohol abuse. This is when you use alcohol too much or too often. You may use alcohol to make yourself feel happy or to reduce stress. You may have a hard time setting a limit on the amount you drink. ?Alcohol dependence. This is when you use alcohol consistently for a period of time, and your body changes as a result. This can make it hard to stop drinking because you may start to feel sick or feel different when you do not use alcohol. These symptoms are known as withdrawal. ?How can alcohol abuse and dependence affect me? ?Alcohol abuse and dependence can have a negative effect on your life. Drinking too much can lead to addiction. You may feel like you need alcohol to function normally. You may drink alcohol before work in the morning, during the day, or as soon as you get home from work in the evening. These actions can result in: ?Poor work Systems analyst. ?Job loss. ?Financial problems. ?Car crashes or criminal charges from driving after drinking alcohol. ?Problems in your relationships with friends and family. ?Losing the trust and respect of coworkers, friends, and family. ?Drinking heavily over a long period of time can permanently damage your body and brain, and can cause lifelong health issues, such as: ?Damage to your liver or pancreas. ?Heart problems, high blood pressure, or stroke. ?Certain cancers. ?Decreased ability to fight infections. ?Brain or nerve damage. ?Depression. ?Early (premature) death. ?If you are careless or you crave alcohol, it is easy to drink more than your body can handle (overdose). Alcohol overdose is a serious situation that requires  hospitalization. It may lead to permanent injuries or death. ?What can increase my risk? ?Having a family history of alcohol abuse. ?Having depression or other mental health conditions. ?Beginning to drink at an early age. ?Binge drinking often. ?Experiencing trauma, stress, and an unstable home life during childhood. ?Spending time with people who drink often. ?What actions can I take to prevent or manage alcohol abuse and dependence? ?Do not drink alcohol if: ?Your health care provider tells you not to drink. ?You are pregnant, may be pregnant, or are planning to become pregnant. ?If you drink alcohol: ?Limit how much you use to: ?0-1 drink a day for women. ?0-2 drinks a day for men. ?Be aware of how much alcohol is in your drink. In the [place] one drink equals one 12 oz bottle of beer (355 mL), one 5 oz glass of wine (148 mL), or one 1? oz glass of hard liquor (44 mL). ?Stop drinking if you have been drinking too much. This can be very hard to do if you are used to abusing alcohol. If you begin to have withdrawal symptoms, talk with your health care provider or a person that you trust. These symptoms may include anxiety, shaky hands, headache, nausea, sweating, or not being able to sleep. ?Choose to drink nonalcoholic beverages in social gatherings and places where there may be alcohol. ?Activity ?Spend more time on activities that you enjoy that do not involve alcohol, like hobbies or exercise. ?Find healthy ways to cope with stress, such as exercise, meditation, or spending time with people you care about. ?General information ?Talk to your family,  coworkers, and friends about supporting you in your efforts to stop drinking. If they drink, ask them not to drink around you. Spend more time with people who do not drink alcohol. If you think that you have an alcohol dependency problem: Tell friends or family about your concerns. Talk with your health care provider or another health professional about where to  get help. Work with a therapist and a chemical dependency counselor. Consider joining a support group for people who struggle with alcohol abuse and dependence. Where to find support  Your health care provider. SMART Recovery: www.smartrecovery.org Therapy and support groups Local treatment centers or chemical dependency counselors. Local AA groups in your community: www.aa.org Where to find more information Centers for Disease Control and Prevention: www.cdc.gov National Institute on Alcohol Abuse and Alcoholism: www.niaaa.nih.gov Alcoholics Anonymous (AA): www.aa.org Contact a health care provider if: You drank more or for longer than you intended on more than one occasion. You tried to stop drinking or to cut back on how much you drink, but you were not able to. You often drink to the point of vomiting or passing out. You want to drink so badly that you cannot think about anything else. You have problems in your life due to drinking, but you continue to drink. You keep drinking even though you feel anxious, depressed, or have experienced memory loss. You have stopped doing the things you used to enjoy in order to drink. You have to drink more than you used to in order to get the effect you want. You experience anxiety, sweating, nausea, shakiness, and trouble sleeping when you try to stop drinking. Get help right away if: You have thoughts about hurting yourself or others. You have serious withdrawal symptoms, including: Confusion. Racing heart. High blood pressure. Fever. If you ever feel like you may hurt yourself or others, or have thoughts about taking your own life, get help right away. You can go to your nearest emergency department or call: Your local emergency services (911 in the [place] A suicide crisis helpline, such as the National Suicide Prevention Lifeline at 1-800-273-8255 or 988 in the [place] This is open 24 hours a day. Summary Alcohol abuse and dependence can  have a negative effect on your life. Drinking too much or too often can lead to addiction. If you drink alcohol, limit how much you use. If you are having trouble keeping your drinking under control, find ways to change your behavior. Hobbies, calming activities, exercise, or support groups can help. If you feel you need help with changing your drinking habits, talk with your health care provider, a good friend, or a therapist, or go to an AA group. This information is not intended to replace advice given to you by your health care provider. Make sure you discuss any questions you have with your health care provider. Document Revised: 01/08/2021 Document Reviewed: 04/24/2018 Elsevier Patient Education  2023 Elsevier Inc.  

## 2021-06-29 ENCOUNTER — Telehealth: Payer: Self-pay | Admitting: Physician Assistant

## 2021-06-29 NOTE — Telephone Encounter (Signed)
Patient called asking where the referral was placed for her to get help with the alcohol abuse? I didn't see it in there. Please advise.  ?

## 2021-06-29 NOTE — Telephone Encounter (Signed)
Psychiatry referral  ? ?Address: [address], [place], [place] [address] ?Hours:  ?Open ? Closes 5:30?PM ?Phone: [phone] ?

## 2021-06-29 NOTE — Telephone Encounter (Signed)
Patient is aware 

## 2021-06-30 ENCOUNTER — Telehealth (HOSPITAL_COMMUNITY): Payer: Self-pay | Admitting: Licensed Clinical Social Worker

## 2021-06-30 NOTE — Telephone Encounter (Signed)
The therapist returns Terri Ayala's voicemail from this morning verifying her identity via three identifiers. ? ?She says that her PCP referred her to this office for "withdrawal symptoms." She says that she has been an alcoholic for thirty years and that she has been through every program. She has had to go to IP detox numerous times saying that she sometimes goes to [facility]. She asks if there is someway for her to be detoxed on an outpatient basis should she need detox again in the future.  ? ?Kacie says that she last drank alcohol 2 weeks ago. She attends AA but does not currently have a Sponsor as they had some sort of falling out. She alludes to a relationship problem with someone who sounds to be a significant other being a trigger to drink. When asked if she is in a relationship with this person now, she responds, "sort of." ? ?Her longest sustained sobriety was for 5 years at which time she was attending meetings and receiving counseling. She says that she has attended both inpatient and outpatient services at [facility] over a period of two years and that she last had a therapist a couple of years ago. ? ?In response to Krystina saying that she has "tried everything," the therapist inquires about MAT. Alexander has never heard of Baclofen but eventually recalls that she was previously on oral Naltrexone from [employer] which she found to be helpful but at some point it was either stopped or she stopped taking it. ? ?She is interested in pursing MAT and starting counseling again. The therapist consults with Mr. Darlyne Russian PA who is agreeable to meeting with her for an assessment.  ? ?The therapist calls Lashonta back again verifying her via three identifiers. The therapist informs her that Mr. Harold Hedge is willing to meet with her for an evaluation and will have the front desk call her to schedule her for one of this Thursday appointments which is in the afternoon. ? ?Adam Phenix, MA, LCSW, [facility], LCAS ?06/30/2021 ? ? ?

## 2021-07-01 ENCOUNTER — Other Ambulatory Visit (HOSPITAL_COMMUNITY): Payer: Self-pay | Admitting: Medical

## 2021-07-01 ENCOUNTER — Ambulatory Visit (HOSPITAL_BASED_OUTPATIENT_CLINIC_OR_DEPARTMENT_OTHER): Payer: Medicare Other | Admitting: Medical

## 2021-07-01 ENCOUNTER — Encounter (HOSPITAL_COMMUNITY): Payer: Self-pay | Admitting: Medical

## 2021-07-01 VITALS — BP 130/84 | HR 90 | Ht 60.0 in | Wt 144.0 lb

## 2021-07-01 DIAGNOSIS — F1020 Alcohol dependence, uncomplicated: Secondary | ICD-10-CM | POA: Diagnosis not present

## 2021-07-01 DIAGNOSIS — Z8659 Personal history of other mental and behavioral disorders: Secondary | ICD-10-CM | POA: Diagnosis not present

## 2021-07-01 DIAGNOSIS — T1490XA Injury, unspecified, initial encounter: Secondary | ICD-10-CM | POA: Diagnosis not present

## 2021-07-01 DIAGNOSIS — Z789 Other specified health status: Secondary | ICD-10-CM

## 2021-07-01 DIAGNOSIS — F1029 Alcohol dependence with unspecified alcohol-induced disorder: Secondary | ICD-10-CM | POA: Diagnosis not present

## 2021-07-01 DIAGNOSIS — Z8781 Personal history of (healed) traumatic fracture: Secondary | ICD-10-CM

## 2021-07-01 DIAGNOSIS — Z9189 Other specified personal risk factors, not elsewhere classified: Secondary | ICD-10-CM

## 2021-07-01 DIAGNOSIS — Z9889 Other specified postprocedural states: Secondary | ICD-10-CM

## 2021-07-01 DIAGNOSIS — Z7409 Other reduced mobility: Secondary | ICD-10-CM

## 2021-07-01 DIAGNOSIS — R945 Abnormal results of liver function studies: Secondary | ICD-10-CM

## 2021-07-01 MED ORDER — DIAZEPAM 10 MG PO TABS: ORAL_TABLET | ORAL | 0 refills | Status: DC

## 2021-07-01 MED ORDER — BACLOFEN 10 MG PO TABS: 10.0000 mg | ORAL_TABLET | Freq: Three times a day (TID) | ORAL | 1 refills | Status: DC

## 2021-07-01 NOTE — Progress Notes (Deleted)
Psychiatric Initial Adult Assessment   Patient Identification: Terri Ayala MRN:  [id number] Date of Evaluation:  07/01/2021 Referral Source: *** Chief Complaint:  No chief complaint on file.  Visit Diagnosis:    ICD-10-CM   1. Alcohol use disorder, severe, dependence (HCC)  F10.20       History of Present Illness:  ***  Associated Signs/Symptoms: Depression Symptoms:  {DEPRESSION SYMPTOMS:20000} (Hypo) Manic Symptoms:  {BHH MANIC SYMPTOMS:22872} Anxiety Symptoms:  {BHH ANXIETY SYMPTOMS:22873} Psychotic Symptoms:  {BHH PSYCHOTIC SYMPTOMS:22874} PTSD Symptoms: {BHH PTSD SYMPTOMS:22875}  Past Psychiatric History: ***  Previous Psychotropic Medications: {YES/NO:21197}  Substance Abuse History in the last 12 months:  {yes no:314532}  Consequences of Substance Abuse: {BHH CONSEQUENCES OF SUBSTANCE ABUSE:22880}  Past Medical History:  Past Medical History:  Diagnosis Date   Alcohol withdrawal (HCC) 11/2017; 01/04/2018   Alcoholism (HCC)    Anxiety    Arthritis    "hands" (01/04/2018)   ASYMPTOMATIC POSTMENOPAUSAL STATUS 08/17/2009   Chronic lower back pain    Depression    DYSPHAGIA 06/19/2007   Fatty liver, alcoholic    GERD (gastroesophageal reflux disease)    GOITER, MULTINODULAR 08/17/2009   Hyperlipidemia    Migraines    "not sure what triggers them; I'll have 1 q couple weeks or more; come 2 days in a row when they come" (01/04/2018)   Mitral valve prolapse    OSTEOPOROSIS 08/17/2009   PONV (postoperative nausea and vomiting)    Supraventricular tachycardia (HCC)     Past Surgical History:  Procedure Laterality Date   BREAST BIOPSY Right    "benign"   CATARACT EXTRACTION W/ INTRAOCULAR LENS  IMPLANT, BILATERAL     CERVICAL CONE BIOPSY  2000s   CERVIX LESION DESTRUCTION  1991   "dysplasia; lesions"   CHOLECYSTECTOMY N/A 03/14/2013   Procedure: LAPAROSCOPIC CHOLECYSTECTOMY WITH ATTEMPTED INTRAOPERATIVE CHOLANGIOGRAM;  Surgeon: Shelly Rubenstein, MD;  Location: [facility];  Service: General;  Laterality: N/A;   laporoscopic abdominal surgery     "for endometriosis"   LEFT HEART CATH AND CORONARY ANGIOGRAPHY N/A 05/07/2018   Procedure: LEFT HEART CATH AND CORONARY ANGIOGRAPHY;  Surgeon: Lyn Records, MD;  [facility]: [facility];  Service: Cardiovascular;  Laterality: N/A;   ORIF ANKLE FRACTURE Left 02/11/2021   Procedure: OPEN REDUCTION INTERNAL FIXATION (ORIF) ANKLE FRACTURE;  Surgeon: Myrene Galas, MD;  Location: [facility] OR;  Service: Orthopedics;  Laterality: Left;    Family Psychiatric History: ***  Family History:  Family History  Problem Relation Age of Onset   Colon cancer Father        Deceased, 36   Osteoporosis Mother        Living, 7   Healthy Brother    Healthy Brother    Healthy Son    Healthy Son    Thyroid disease Neg Hx    Goiter Neg Hx     Social History:   Social History   Socioeconomic History   Marital status: Single    Spouse name: Not on file   Number of children: Not on file   Years of education: Not on file   Highest education level: Not on file  Occupational History   Occupation: Hydrographic surveyor - retired    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[employer]  Tobacco Use   Smoking status: Former    Packs/day: 1.00    Years: 7.00    Pack years: 7.00    Types: Cigarettes    Quit date: 02/28/1974    Years  since quitting: 47.3   Smokeless tobacco: Never   Tobacco comments:    01/04/2018 "smoked when I was a teenager"  Vaping Use   Vaping Use: Never used  Substance and Sexual Activity   Alcohol use: Yes    Alcohol/week: 28.0 standard drinks    Types: 28 Glasses of wine per week    Comment: 01/04/2018 "1 1/2 bottles of wine/day"   Drug use: Never   Sexual activity: Not Currently  Other Topics Concern   Not on file  Social History Narrative   Lives alone.  She has a BS biology.   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   Social Determinants of Health   Financial Resource Strain: Not on file   Food Insecurity: Not on file  Transportation Needs: Not on file  Physical Activity: Not on file  Stress: Not on file  Social Connections: Not on file    Additional Social History: ***  Allergies:   Allergies  Allergen Reactions   Doxycycline Swelling    Mouth swelling and sores   Codeine Itching   Tetracycline Hcl Swelling    Mouth swelling and sores    Metabolic Disorder Labs: Lab Results  Component Value Date   HGBA1C 4.7 (L) 11/21/2020   MPG 88.19 11/21/2020   MPG 97 03/19/2013   No results found for: PROLACTIN Lab Results  Component Value Date   CHOL 199 10/19/2020   TRIG 68 10/19/2020   HDL 48 10/19/2020   CHOLHDL 4.1 10/19/2020   VLDL 10 10/04/2012   LDLCALC 139 (H) 10/19/2020   LDLCALC 202 (H) 01/10/2020   Lab Results  Component Value Date   TSH 3.332 06/08/2021    Therapeutic Level Labs: No results found for: LITHIUM No results found for: CBMZ No results found for: VALPROATE  Current Medications: Current Outpatient Medications  Medication Sig Dispense Refill   albuterol (VENTOLIN HFA) 108 (90 Base) MCG/ACT inhaler Inhale 2 puffs into the lungs every 6 (six) hours as needed for wheezing or shortness of breath. 8.5 g 2   alendronate (FOSAMAX) 70 MG tablet Take 70 mg by mouth every Monday.     busPIRone (BUSPAR) 15 MG tablet Take 15 mg by mouth 2 (two) times daily.     Calcium Carbonate-Vitamin D (CALCIUM-D PO) Take 1 tablet by mouth every morning.     carvedilol (COREG) 3.125 MG tablet Take 1 tablet (3.125 mg total) by mouth 2 (two) times daily with a meal.     Cholecalciferol (VITAMIN D-3 PO) Take 1 tablet by mouth every morning.     DULoxetine (CYMBALTA) 60 MG capsule TAKE 1 CAPSULE (60 MG TOTAL) BY MOUTH DAILY. (Patient taking differently: Take 60 mg by mouth 2 (two) times daily.) 30 capsule 0   fluticasone (FLONASE) 50 MCG/ACT nasal spray Place 2 sprays into both nostrils daily.     folic acid (FOLVITE) 1 MG tablet Take 1 mg by mouth daily.      gabapentin (NEURONTIN) 100 MG capsule Take 2 capsules (200 mg total) by mouth 3 (three) times daily. 90 capsule 1   hydrocortisone 2.5 % cream Apply 1 application topically 2 (two) times daily as needed (itching).     hydrOXYzine (ATARAX/VISTARIL) 25 MG tablet Take 25 mg by mouth 2 (two) times daily.     ibuprofen (ADVIL) 200 MG tablet Take 200 mg by mouth every 6 (six) hours as needed for headache.     JINTELI 1-5 MG-MCG TABS tablet Take 1 tablet  by mouth daily.     meloxicam (MOBIC) 7.5 MG tablet Take 1 tablet (7.5 mg total) by mouth daily. (Patient taking differently: Take 15 mg by mouth daily.) 30 tablet 1   Multiple Vitamin (MULTIVITAMIN WITH MINERALS) TABS tablet Take 1 tablet by mouth daily. 30 tablet 0   pantoprazole (PROTONIX) 40 MG tablet TAKE 1 TABLET BY MOUTH 2 TIMES DAILY. (Patient taking differently: Take 40 mg by mouth 2 (two) times daily.) 180 tablet 1   SENNA PLUS 8.6-50 MG tablet Take 2 tablets by mouth at bedtime.     SUMAtriptan (IMITREX) 100 MG tablet TAKE 1 TABLET BY MOUTH AT THE FIRST SIGN OF MIGRAINE, MAY REPEAT IN 2 HOURS. MAX 2 TABLETS IN 24 HOURS (Patient taking differently: Take 100 mg by mouth See admin instructions. 100mg  at first sign of migraine, may repeat in 2 hours. Max 200mg  in 24 hours) 10 tablet 0   thiamine 100 MG tablet Take 1 tablet (100 mg total) by mouth daily.     traZODone (DESYREL) 50 MG tablet Take 50 mg by mouth at bedtime as needed for sleep.     No current facility-administered medications for this visit.    Musculoskeletal: Strength & Muscle Tone: {desc; muscle tone:32375} Gait & Station: {PE GAIT ED KGMW:10272} Patient leans: {Patient Leans:21022755}  Psychiatric Specialty Exam: Review of Systems  Constitutional:  Positive for activity change and fatigue. Negative for appetite change, chills, diaphoresis, fever and unexpected weight change.  HENT: Negative.    Eyes:  Negative for pain, discharge, redness, itching and visual disturbance.   Respiratory:  Negative for apnea, cough, choking, chest tightness, shortness of breath, wheezing and stridor.   Cardiovascular:  Negative for chest pain, palpitations and leg swelling.  Gastrointestinal:  Negative for abdominal distention, abdominal pain, anal bleeding, blood in stool, constipation, diarrhea, nausea, rectal pain and vomiting.  Endocrine: Negative for cold intolerance, heat intolerance, polydipsia, polyphagia and polyuria.  Genitourinary:  Negative for decreased urine volume, difficulty urinating, dyspareunia, dysuria, enuresis, flank pain, frequency, genital sores, hematuria, menstrual problem, pelvic pain, urgency, vaginal bleeding, vaginal discharge and vaginal pain.  Musculoskeletal:  Positive for arthralgias, back pain, gait problem, joint swelling, myalgias and neck pain. Negative for neck stiffness.       Lt ankle ORIF  Skin:  Positive for color change and rash. Negative for pallor and wound.       [facility], generated on May 04, 2023May 04, 2023  PROBLEMS Reconcile with Patient's Chart PROBLEMS Type Condition ICD9-CM Code ICD10-CM Code Onset Dates Condition Status SNOMED Code Problem Melanocytic nevi of trunk   D22.5   Active 536644034 Problem Dermatitis   L30.9   Active [id number] Problem History of dysplastic nevus   Z86.018   Active 756433295 Problem SK (seborrheic keratosis)   L82.1   Active [id number] Problem Rosacea   L71.9   Active [id number]    Allergic/Immunologic: Negative for environmental allergies, food allergies and immunocompromised state.  Neurological:  Positive for syncope (Falls) and weakness. Negative for dizziness, tremors (With withdrawal), seizures, facial asymmetry, speech difficulty, light-headedness, numbness and headaches.  Hematological:  Negative for adenopathy. Does not bruise/bleed easily.  Psychiatric/Behavioral:  Positive for dysphoric mood and sleep disturbance. Negative for agitation, behavioral problems, confusion,  decreased concentration, hallucinations, self-injury and suicidal ideas. The patient is nervous/anxious. The patient is not hyperactive.    There were no vitals taken for this visit.There is no height or weight on file to calculate BMI.  General Appearance: {Appearance:22683}  Eye Contact:  {  BHH EYE CONTACT:22684}  Speech:  {Speech:22685}  Volume:  {Volume (PAA):22686}  Mood:  {BHH MOOD:22306}  Affect:  {Affect (PAA):22687}  Thought Process:  {Thought Process (PAA):22688}  Orientation:  {BHH ORIENTATION (PAA):22689}  Thought Content:  {Thought Content:22690}  Suicidal Thoughts:  {ST/HT (PAA):22692}  Homicidal Thoughts:  {ST/HT (PAA):22692}  Memory:  {BHH MEMORY:22881}  Judgement:  {Judgement (PAA):22694}  Insight:  {Insight (PAA):22695}  Psychomotor Activity:  {Psychomotor (PAA):22696}  Concentration:  {Concentration:21399}  Recall:  {BHH GOOD/FAIR/POOR:22877}  Fund of Knowledge:{BHH GOOD/FAIR/POOR:22877}  Language: {BHH GOOD/FAIR/POOR:22877}  Akathisia:  {BHH YES OR NO:22294}  Handed:  {Handed:22697}  AIMS (if indicated):  {Desc; done/not:10129}  Assets:  {Assets (PAA):22698}  ADL's:  {BHH OZH'Y:86578}  Cognition: {chl bhh cognition:304700322}  Sleep:  {BHH GOOD/FAIR/POOR:22877}   Screenings: AUDIT    Flowsheet Row [facility]) [facility]  Alcohol Use Disorder Identification Test Final Score (AUDIT) 18 21      GAD-7    Flowsheet Row Office Visit from 05/05/2021 in [facility] Visit from 12/07/2020 in [facility] at [facility] Visit from 10/19/2020 in [facility] at [facility]  Total GAD-7 Score 6 2 0      PHQ2-9    Flowsheet Row Video Visit from 05/17/2021 in [facility] Care at [facility] Office Visit from 05/05/2021 in [facility] Primary Care at  [facility] Office Visit from 12/07/2020 in [facility] Care at [facility] Office Visit from 10/19/2020 in [facility] Care at [facility] Office Visit from 04/15/2020 in [facility] Primary Care at [facility]  PHQ-2 Total Score 0 2 1 0 0  PHQ-9 Total Score 1 8 3  0 1      Flowsheet Row ED to Hosp-Admission (Discharged) from 06/08/2021 in [place] [facility] ED to Hosp-Admission (Discharged) from 02/10/2021 in [place] [facility] ED to Hosp-Admission (Discharged) from 11/20/2020 in [facility] 3[facility] HF PCU  C-SSRS RISK CATEGORY No Risk No Risk No Risk       Assessment : Chronic alcoholism  and Plan: Begin OP Detox with Valium /Start Baclofen MAT for Cravings/FU Appt with [facility] IOP Counselor to begin therapy    [name], PA-C 5/4/20232:39 PM
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
## 2021-07-02 ENCOUNTER — Other Ambulatory Visit: Payer: Self-pay | Admitting: Physician Assistant

## 2021-07-02 ENCOUNTER — Other Ambulatory Visit: Payer: Self-pay | Admitting: Nurse Practitioner

## 2021-07-02 ENCOUNTER — Telehealth: Payer: Self-pay | Admitting: Physician Assistant

## 2021-07-02 DIAGNOSIS — R0602 Shortness of breath: Secondary | ICD-10-CM

## 2021-07-02 NOTE — Telephone Encounter (Signed)
Terri Ayala from [facility] home [facility] called to report a high heart rate of 108 resting. Patient is asymptomatic but has had to use the albuterol inhaler and maybe that contributed to it. If you need to contact here is her number [phone] ?

## 2021-07-07 ENCOUNTER — Encounter (HOSPITAL_COMMUNITY): Payer: Self-pay | Admitting: Licensed Clinical Social Worker

## 2021-07-07 ENCOUNTER — Ambulatory Visit (INDEPENDENT_AMBULATORY_CARE_PROVIDER_SITE_OTHER): Payer: Federal, State, Local not specified - PPO | Admitting: Licensed Clinical Social Worker

## 2021-07-07 DIAGNOSIS — F1020 Alcohol dependence, uncomplicated: Secondary | ICD-10-CM

## 2021-07-07 NOTE — Progress Notes (Addendum)
Terri Ayala presents today for her CCA which is to evaluate her appropriateness for attending SA IOP. She is accompanied by her Terri Ayala who asks if she can come back with Terri Ayala with Terri Ayala consenting to this.  ? ?The Nurse expresses concerns that Terri Ayala was sent home with Valium for an in-home detox noting that when they became aware of it that they started staying with her 24 hours a day. The Nurse says that Terri Ayala had BACs of .13. and .15 as recently as 07/04/21 and that her BAC was not .00 until Monday, 07/05/21 at 7 p.m. at which time the Nurse started the Valium. ? ?The Nurse says that she called this office for clarification about the Valium schedule as it did not make sense to her noting that she never received a return call. She notes that when Terri Ayala does not have the Valium that she starts into withdrawal and says that she believes Terri Ayala will likely resume to drinking almost immediately this Friday when she no longer has Valium and they are no longer staying with her 24 hours continuously.  ? ?Terri Ayala went to [employer] in 2016 and was sober until around 2017; however, she has basically been drinking almost continuously since consuming three to six bottles of wine per day. The Nurse says that Terri Ayala recently drank two bottles of wine in two days. Since her year of sobriety, Terri Ayala would occasionally try and quit drinking and go to detox only to relapse shortly after leaving detox. The intervals of sobriety post-detox have become progressively shorter such that she is now drinking the day that she leaves detox. The Nurse says that Terri Ayala's withdrawal is so severe that she had to be detoxed at the hospital two weeks ago and the Nurse was surprised that she was kept for only three days and sent home on no medications. ? ?Terri Ayala fell and broke her ankle and was put in an assisted living or nursing care facility for two-and-a-half months and was unable to drink during this time. Terri Ayala's brother reached out and got her current Nursing  care put in place with the Nurse saying that Terri Ayala started drinking once she returned home.  ? ?The Nurse says that Terri Ayala has gotten drunk and locked herself out of the house such that they had to put a key box on the house as she broke out two windows to get back in and on one occasion she pushed her life alert button due to being in distress.  ? ?The Nurse reports having worked in the addiction field for a number of years and states her opinion that Terri Ayala needs inpatient, residential treatment. The therapist educates Terri Ayala on the ASAM placement criteria and also indicates that inpatient detox and/or residential treatment would be the appropriate LOC. ? ?Terri Ayala expresses reluctance about going to IP treatment due to concerns about who would take care of her house and who would take care of her dog. The Nurse explains that if Terri Ayala were to go to a 28 day program that they could come and clean her house and that she could keep and take care of Terri Ayala's dog such that it would not have to be boarded. ? ?The therapist explains that it was his understanding that Terri Ayala had not drunk since the last detox but was exploring possible options for in-home detox if she relapsed in the future. Thus, the therapist recommended that Terri Ayala meet with Mr. Darlyne Russian, PA. both for counseling and to discuss anti-craving options she had  not tried in the past. The therapist reiterates that he informed Terri Ayala on the phone that he was not aware of any providers who do detox with people at home and that when he discussed the case with Terri Ayala, [place] that he too said that he was unaware of any providers that did in-home detox nor would he do it.  ? ?Thus, the therapist encourages Terri Ayala to go inpatient, residential treatment while getting on anti-craving medication with SA IOP as a likely stepdown upon completion of IP treatment.  ? ?As the therapist is unable to answer Terri Ayala's Nurse's questions about her Valium protocol, etc.; he arranges for Terri Ayala and the Nurse to meet  with Terri Ayala, [place] after this meeting. ? ?After speaking with Terri Ayala, [place]; Zakiyah indicates that she does not want to go to a 28 day program but is apparently willing to go to [facility] for IP detox. ? ?Adam Phenix, MA, LCSW, [facility], LCAS ?07/07/2021 ? ?ADDENDUM: ?Met with patient and her Nurse Pt has been on 24 hour home health watch and did not start Valium until Monday-She remains unwilling to go for Residential care. Nurse has observed she would experience some tremor and sweats with Valium which abated on dosing.Reported no way to know CIWA as this is outside home care. Pt reports she understands that she cannot be on Valium long term. Advised to stop taking at this point .Assuming she will return to drinking.Advised to use [facility] for when she wants to stop and she can return here fully detoxed if she wishes to try [facility] IOP. SHE UNDERSTANDS THAT IF SHE IS UNABLE TO DO cdiop SHE WILL NEED TO ATTEND A HIGHER LEVEL OF CARE.  ?Her Nurse was present for this visit. ?Dara Hoyer PA-C ? ? ? ?

## 2021-07-12 ENCOUNTER — Telehealth (HOSPITAL_COMMUNITY): Payer: Self-pay | Admitting: Medical

## 2021-07-12 DIAGNOSIS — R945 Abnormal results of liver function studies: Secondary | ICD-10-CM

## 2021-07-12 DIAGNOSIS — Z7409 Other reduced mobility: Secondary | ICD-10-CM

## 2021-07-12 DIAGNOSIS — T1490XA Injury, unspecified, initial encounter: Secondary | ICD-10-CM

## 2021-07-12 DIAGNOSIS — F1020 Alcohol dependence, uncomplicated: Secondary | ICD-10-CM

## 2021-07-12 DIAGNOSIS — F1029 Alcohol dependence with unspecified alcohol-induced disorder: Secondary | ICD-10-CM

## 2021-07-12 DIAGNOSIS — Z9889 Other specified postprocedural states: Secondary | ICD-10-CM

## 2021-07-12 DIAGNOSIS — Z8659 Personal history of other mental and behavioral disorders: Secondary | ICD-10-CM

## 2021-07-12 DIAGNOSIS — Z9189 Other specified personal risk factors, not elsewhere classified: Secondary | ICD-10-CM

## 2021-07-14 ENCOUNTER — Encounter (HOSPITAL_COMMUNITY): Payer: Self-pay | Admitting: Vascular Surgery

## 2021-07-14 NOTE — Progress Notes (Signed)
LVM with pre-op instructions. Arrival 0530, surgery time [phone]. See pre-op call list for further information. ?

## 2021-07-14 NOTE — Progress Notes (Signed)
Anesthesia Chart Review: SAME DAY WORK-UP ? Case: [id number] Date/Time: 07/15/21 0745  ? Procedure: HARDWARE REMOVAL (Left)  ? Anesthesia type: Choice  ? Pre-op diagnosis: SYMPTOMATIC HARDWARE LEFT ANKLE  ? Location: [facility] ROOM 03 / [facility] OR  ? Surgeons: Altamese Marion, MD  ? ?  ? ? ?DISCUSSION: Patient is a 66 year old female scheduled for the above procedure. ? ?History includes former smoker (quit 02/28/74), post-operative N/V, HLD, multinodular goiter, SVT, MVP, alcoholic fatty liver, alcoholism with alcohol withdrawal, GERD, dysphagia, chronic low back pain, migraines.  ? ?In reviewing her chart there is an [facility] admission encounter for 07/13/21. A note fro m5/16/23 10:29 PM indicates that patient " voluntary admitted for alcohol detox. Pt arrived at ED today intoxicated - BAL 393 @ 1136 and 96 @ 2115. Pt sent by her physician for medical detox before scheduling of pt's ankle revision surgery to remove hardware." Note also indicated that she has been drinking 4-5 bottles wine/day x 30 years and that morning had drank two bottles of wine and one bottle of champagne. There is also a notation from 07/14/21 5:18 AM stating, "Pt signed 72 hour request for discharge @ 2217 hrs in ED." ? ?I have communicated with Gwyn at Dr. Carlean Jews office since it was unclear when her detox would be completed. She discussed with APP with plans to cancel surgery for tomorrow. I also mentioned that our RN staff had not been able to reach patient, so advised they attempt to communicate with patient.   ? ? ? ?VS:  ?BP Readings from Last 3 Encounters:  ?06/16/21 130/84  ?06/11/21 (!) 141/98  ?05/05/21 (!) 155/83  ? ?Pulse Readings from Last 3 Encounters:  ?06/16/21 90  ?06/11/21 96  ?05/05/21 93  ?  ?PROVIDERS: ?Lorrene Reid, PA-C is PCP  ?- It does not appear that she is routinely followed by cardiology but last saw Quay Burow, MD in March 2020 and had no significant CAD by coronary catheterization.  She had previously  seen Dr. Lauree Chandler and EP cardiologist Dr. Crissie Sickles for history of SVT. ? ?LABS: As above.  Patient admitted to [facility] as of 07/13/2021 for alcohol detox.  See Care Everywhere for labs done there on 07/13/2021.  LFTs were elevated.  UDS was negative. ? ? ?IMAGES: ?CT C-spine 06/08/21: ?IMPRESSION: ?No acute fracture or traumatic malalignment of the cervical spine. ?  ?CT Head 06/08/21: ?IMPRESSION: ?No acute intracranial pathology. ?  ?Xray left ankle 02/11/21: ?IMPRESSION: ?Interval internal fixation of bimalleolar fractures with anatomic ?alignment. ? ? ?EKG: 06/11/21:  ?Sinus tachycardia at 137 bpm ?Otherwise normal ECG ?Since last tracing of earlier today ?No significant change was found ?Confirmed by Nelva Bush [phone]) on 06/11/2021 7:03:55 AM ? ? ?CV: ?Echo 09/27/20: ?IMPRESSIONS  ? 1. Left ventricular ejection fraction, by estimation, is 65 to 70%. The  ?left ventricle has normal function. The left ventricle has no regional  ?wall motion abnormalities. Left ventricular diastolic parameters are  ?indeterminate (difficult tissue Doppler  ?acquisition).  ? 2. Right ventricular systolic function is hyperdynamic. The right  ?ventricular size is normal. Tricuspid regurgitation signal is inadequate  ?for assessing PA pressure.  ? 3. The mitral valve is normal in structure. No evidence of mitral valve  ?regurgitation. No evidence of mitral stenosis.  ? 4. The aortic valve was not well visualized. Aortic valve regurgitation  ?is not visualized. No aortic stenosis is present.  ? 5. The inferior vena cava is normal in size with  greater than 50%  ?respiratory variability, suggesting right atrial pressure of 3 mmHg.  ?- Comparison(s): A prior study was performed on 05/05/2018. LV and RV function  ?is more vigorous.  ? ? ?Cardiac cath 05/07/18: ?Right dominant coronary anatomy ?Normal left main ?Widely patent LAD with diffuse luminal irregularities throughout the mid and distal vessel.  No focally  obstructive CAD is noted. ?Widely patent circumflex without focally obstructive CAD. ?Widely patent right coronary without evidence of focally obstructive disease. ?Normal LV systolic function with LVEF 65%.  LVEDP is normal. ?  ?RECOMMENDATIONS: ?No obstructive coronary disease is identified. ?Highly tortuous coronary arteries. ?Consider alternative explanations for chest discomfort. Doubt INOCA. ? ? ?Event monitor 01/16/12-02/05/12: ?Normal sinus rhythm.  No PACs, PVCs. ? ? ?Past Medical History:  ?Diagnosis Date  ? Alcohol withdrawal ([place]) 11/2017; 01/04/2018  ? Alcoholism ([facility])   ? Anxiety   ? Arthritis   ? "hands" (01/04/2018)  ? ASYMPTOMATIC POSTMENOPAUSAL STATUS 08/17/2009  ? Chronic lower back pain   ? Depression   ? DYSPHAGIA 06/19/2007  ? Fatty liver, alcoholic   ? GERD (gastroesophageal reflux disease)   ? GOITER, MULTINODULAR 08/17/2009  ? Hyperlipidemia   ? Migraines   ? "not sure what triggers them; I'll have 1 q couple weeks or more; come 2 days in a row when they come" (01/04/2018)  ? Mitral valve prolapse   ? OSTEOPOROSIS 08/17/2009  ? PONV (postoperative nausea and vomiting)   ? Supraventricular tachycardia ([place])   ? ? ?Past Surgical History:  ?Procedure Laterality Date  ? BREAST BIOPSY Right   ? "benign"  ? CATARACT EXTRACTION W/ INTRAOCULAR LENS  IMPLANT, BILATERAL    ? CERVICAL CONE BIOPSY  2000s  ? CERVIX LESION DESTRUCTION  1991  ? "dysplasia; lesions"  ? CHOLECYSTECTOMY N/A 03/14/2013  ? Procedure: LAPAROSCOPIC CHOLECYSTECTOMY WITH ATTEMPTED INTRAOPERATIVE CHOLANGIOGRAM;  Surgeon: Harl Bowie, MD;  Location: [facility];  Service: General;  Laterality: N/A;  ? laporoscopic abdominal surgery    ? "for endometriosis"  ? LEFT HEART CATH AND CORONARY ANGIOGRAPHY N/A 05/07/2018  ? Procedure: LEFT HEART CATH AND CORONARY ANGIOGRAPHY;  Surgeon: Belva Crome, MD;  Location: [facility];  Service: Cardiovascular;  Laterality: N/A;  ? ORIF ANKLE FRACTURE Left 02/11/2021  ? Procedure: OPEN REDUCTION  INTERNAL FIXATION (ORIF) ANKLE FRACTURE;  Surgeon: Altamese Four Oaks, MD;  Location: [facility];  Service: Orthopedics;  Laterality: Left;  ? ? ?MEDICATIONS: ?No current facility-administered medications for this encounter.  ? ? albuterol (VENTOLIN HFA) 108 (90 Base) MCG/ACT inhaler  ? alendronate (FOSAMAX) 70 MG tablet  ? baclofen (LIORESAL) 10 MG tablet  ? busPIRone (BUSPAR) 15 MG tablet  ? Calcium Carbonate-Vitamin D (CALCIUM-D PO)  ? carvedilol (COREG) 3.125 MG tablet  ? Cholecalciferol (VITAMIN D-3 PO)  ? diazepam (VALIUM) 10 MG tablet  ? diclofenac Sodium (VOLTAREN) 1 % GEL  ? DULoxetine (CYMBALTA) 60 MG capsule  ? fluticasone (FLONASE) 50 MCG/ACT nasal spray  ? folic acid (FOLVITE) 1 MG tablet  ? gabapentin (NEURONTIN) 100 MG capsule  ? hydrocortisone 2.5 % cream  ? hydrOXYzine (ATARAX/VISTARIL) 25 MG tablet  ? ibuprofen (ADVIL) 200 MG tablet  ? lidocaine (LIDODERM) 5 %  ? meloxicam (MOBIC) 15 MG tablet  ? Multiple Vitamins-Minerals (MULTIVITAMIN ADULT, MINERALS,) TABS  ? Norethindrone-Eth Estradiol (FEMHRT 1/5 PO)  ? pantoprazole (PROTONIX) 40 MG tablet  ? propranolol (INDERAL) 20 MG tablet  ? SENNA PLUS 8.6-50 MG tablet  ? SUMAtriptan (IMITREX) 50 MG tablet  ?  thiamine 100 MG tablet  ? traZODone (DESYREL) 50 MG tablet  ? ? ?Myra Gianotti, PA-C ?Surgical Short Stay/Anesthesiology ?[facility] Phone [phone] ?[facility] Phone [phone] ?07/14/2021 3:35 PM ? ? ? ? ? ? ? ? ?

## 2021-07-14 NOTE — H&P (Incomplete)
? ?     Orthopaedic Trauma Service (OTS) Consult  ? ?Patient ID: ?Terri Ayala ?MRN: [id number] ?DOB/AGE: Jul 26, 1955 66 y.o. ? ? ?Reason for Consult:*** ?Referring Physician: *** ? ? ?HPI: Terri Ayala is an 66 y.o. female *** ? ?Past Medical History:  ?Diagnosis Date  ? Alcohol withdrawal ([place]) 11/2017; 01/04/2018  ? Alcoholism ([place])   ? Anxiety   ? Arthritis   ? "hands" (01/04/2018)  ? ASYMPTOMATIC POSTMENOPAUSAL STATUS 08/17/2009  ? Chronic lower back pain   ? Depression   ? DYSPHAGIA 06/19/2007  ? Fatty liver, alcoholic   ? GERD (gastroesophageal reflux disease)   ? GOITER, MULTINODULAR 08/17/2009  ? Hyperlipidemia   ? Migraines   ? "not sure what triggers them; I'll have 1 q couple weeks or more; come 2 days in a row when they come" (01/04/2018)  ? Mitral valve prolapse   ? OSTEOPOROSIS 08/17/2009  ? PONV (postoperative nausea and vomiting)   ? Supraventricular tachycardia ([place])   ? ? ?Past Surgical History:  ?Procedure Laterality Date  ? BREAST BIOPSY Right   ? "benign"  ? CATARACT EXTRACTION W/ INTRAOCULAR LENS  IMPLANT, BILATERAL    ? CERVICAL CONE BIOPSY  2000s  ? CERVIX LESION DESTRUCTION  1991  ? "dysplasia; lesions"  ? CHOLECYSTECTOMY N/A 03/14/2013  ? Procedure: LAPAROSCOPIC CHOLECYSTECTOMY WITH ATTEMPTED INTRAOPERATIVE CHOLANGIOGRAM;  Surgeon: Harl Bowie, MD;  Location: [place] [facility];  Service: General;  Laterality: N/A;  ? laporoscopic abdominal surgery    ? "for endometriosis"  ? LEFT HEART CATH AND CORONARY ANGIOGRAPHY N/A 05/07/2018  ? Procedure: LEFT HEART CATH AND CORONARY ANGIOGRAPHY;  Surgeon: Belva Crome, MD;  Location: [facility];  Service: Cardiovascular;  Laterality: N/A;  ? ORIF ANKLE FRACTURE Left 02/11/2021  ? Procedure: OPEN REDUCTION INTERNAL FIXATION (ORIF) ANKLE FRACTURE;  Surgeon: Altamese Washita, MD;  Location: [facility];  Service: Orthopedics;  Laterality: Left;  ? ? ?Family History  ?Problem Relation Age of Onset  ? Colon cancer Father   ?     Deceased, 98  ? Osteoporosis Mother    ?     Living, 78  ? Healthy Brother   ? Healthy Brother   ? Healthy Son   ? Healthy Son   ? Thyroid disease Neg Hx   ? Goiter Neg Hx   ? ? ?Social History:  reports that she quit smoking about 47 years ago. Her smoking use included cigarettes. She has a 7.00 pack-year smoking history. She has never used smokeless tobacco. She reports current alcohol use of about 28.0 standard drinks per week. She reports that she does not use drugs. ? ?Allergies:  ?Allergies  ?Allergen Reactions  ? Doxycycline Swelling  ?  Mouth swelling and sores  ? Codeine Itching  ? Tetracycline Hcl Swelling  ?  Mouth swelling and sores  ? ? ?Medications: {medication reviewed/display:3041432} ? ?No results found for this or any previous visit (from the past 48 hour(s)). ? ?No results found. ? ?Intake/Output   ? None  ?  ? ? ?ROS ?There were no vitals taken for this visit. ?Physical Exam ?Ortho Exam ? ? ?      ?Gait: *** ?Coordination and balance: *** ? ? ?Assessment/Plan: ? ?*** ? ?-(ortho injury): ? ?- Pain management: ? *** ?- ABL anemia/Hemodynamics ? *** ?- Medical issues  ? *** ?- DVT/PE prophylaxis: ? *** ?- ID:  ? *** ?- Metabolic Bone Disease: ? *** ?- Activity: ? *** ?- FEN/GI prophylaxis/Foley/Lines: ? *** ?-  Ex-fix/Splint care: ? *** ?- Impediments to fracture healing: ? *** ?- Dispo: ? *** ? ?{ORTHOADMISSIONSTATUS:21269} ? ?Weightbearing: {weightbearing/status:21254} {affected extremity :21255} ?Insicional and dressing care: {Insicional and dressing care:21256} ?Orthopedic device(s): {CHL orthopedic devices:21267::"None"} ?Showering: *** ?VTE prophylaxis: {Type:21257} {Duration:21258} ?Pain control: *** ?Follow - up plan: {Follow-up plan:21259} ?Contact information:  *** MD, *** PA ? ? ?Jari Pigg, PA-C ?[phone] (C) ?07/14/2021, 12:24 PM ? ?[facility] ?[place] [address] ?[phone] Jenetta Downer) ?[phone] (F) ? ? ? ?After 5pm and on the weekends please log on to Amion, go to orthopaedics  and the look under the [facility] Call for the provider(s) on call. You can also call our office at [phone] and then follow the prompts to be connected to the call team.   ? ?

## 2021-07-15 ENCOUNTER — Ambulatory Visit (HOSPITAL_COMMUNITY): Admission: RE | Admit: 2021-07-15 | Payer: Medicare Other | Source: Home / Self Care | Admitting: Orthopedic Surgery

## 2021-07-15 SURGERY — REMOVAL, HARDWARE
Anesthesia: Choice | Laterality: Left

## 2021-07-16 ENCOUNTER — Telehealth (HOSPITAL_COMMUNITY): Payer: Self-pay | Admitting: Licensed Clinical Social Worker

## 2021-07-16 NOTE — Telephone Encounter (Signed)
The therapist returns a call from [name] with [facility] who requests and aftercare appointment for [facility] IOP for Terri Ayala who will be discharged tomorrow, 07/17/21.  Adam Phenix, [place], LCSW, [facility], [place] 07/16/2021

## 2021-07-16 NOTE — Patient Instructions (Incomplete)
Preventive Care 65 Years and Older, Female Preventive care refers to lifestyle choices and visits with your health care provider that can promote health and wellness. Preventive care visits are also called wellness exams. What can I expect for my preventive care visit? Counseling Your health care provider may ask you questions about your: Medical history, including: Past medical problems. Family medical history. Pregnancy and menstrual history. History of falls. Current health, including: Memory and ability to understand (cognition). Emotional well-being. Home life and relationship well-being. Sexual activity and sexual health. Lifestyle, including: Alcohol, nicotine or tobacco, and drug use. Access to firearms. Diet, exercise, and sleep habits. Work and work environment. Sunscreen use. Safety issues such as seatbelt and bike helmet use. Physical exam Your health care provider will check your: Height and weight. These may be used to calculate your BMI (body mass index). BMI is a measurement that tells if you are at a healthy weight. Waist circumference. This measures the distance around your waistline. This measurement also tells if you are at a healthy weight and may help predict your risk of certain diseases, such as type 2 diabetes and high blood pressure. Heart rate and blood pressure. Body temperature. Skin for abnormal spots. What immunizations do I need?  Vaccines are usually given at various ages, according to a schedule. Your health care provider will recommend vaccines for you based on your age, medical history, and lifestyle or other factors, such as travel or where you work. What tests do I need? Screening Your health care provider may recommend screening tests for certain conditions. This may include: Lipid and cholesterol levels. Hepatitis C test. Hepatitis B test. HIV (human immunodeficiency virus) test. STI (sexually transmitted infection) testing, if you are at  risk. Lung cancer screening. Colorectal cancer screening. Diabetes screening. This is done by checking your blood sugar (glucose) after you have not eaten for a while (fasting). Mammogram. Talk with your health care provider about how often you should have regular mammograms. BRCA-related cancer screening. This may be done if you have a family history of breast, ovarian, tubal, or peritoneal cancers. Bone density scan. This is done to screen for osteoporosis. Talk with your health care provider about your test results, treatment options, and if necessary, the need for more tests. Follow these instructions at home: Eating and drinking  Eat a diet that includes fresh fruits and vegetables, whole grains, lean protein, and low-fat dairy products. Limit your intake of foods with high amounts of sugar, saturated fats, and salt. Take vitamin and mineral supplements as recommended by your health care provider. Do not drink alcohol if your health care provider tells you not to drink. If you drink alcohol: Limit how much you have to 0-1 drink a day. Know how much alcohol is in your drink. In the [place] one drink equals one 12 oz bottle of beer (355 mL), one 5 oz glass of wine (148 mL), or one 1 oz glass of hard liquor (44 mL). Lifestyle Brush your teeth every morning and night with fluoride toothpaste. Floss one time each day. Exercise for at least 30 minutes 5 or more days each week. Do not use any products that contain nicotine or tobacco. These products include cigarettes, chewing tobacco, and vaping devices, such as e-cigarettes. If you need help quitting, ask your health care provider. Do not use drugs. If you are sexually active, practice safe sex. Use a condom or other form of protection in order to prevent STIs. Take aspirin only as told by   your health care provider. Make sure that you understand how much to take and what form to take. Work with your health care provider to find out whether it  is safe and beneficial for you to take aspirin daily. Ask your health care provider if you need to take a cholesterol-lowering medicine (statin). Find healthy ways to manage stress, such as: Meditation, yoga, or listening to music. Journaling. Talking to a trusted person. Spending time with friends and family. Minimize exposure to UV radiation to reduce your risk of skin cancer. Safety Always wear your seat belt while driving or riding in a vehicle. Do not drive: If you have been drinking alcohol. Do not ride with someone who has been drinking. When you are tired or distracted. While texting. If you have been using any mind-altering substances or drugs. Wear a helmet and other protective equipment during sports activities. If you have firearms in your house, make sure you follow all gun safety procedures. What's next? Visit your health care provider once a year for an annual wellness visit. Ask your health care provider how often you should have your eyes and teeth checked. Stay up to date on all vaccines. This information is not intended to replace advice given to you by your health care provider. Make sure you discuss any questions you have with your health care provider. Document Revised: 08/12/2020 Document Reviewed: 08/12/2020 Elsevier Patient Education  2023 [employer]  

## 2021-07-19 ENCOUNTER — Ambulatory Visit (INDEPENDENT_AMBULATORY_CARE_PROVIDER_SITE_OTHER): Payer: Medicare Other | Admitting: Licensed Clinical Social Worker

## 2021-07-19 DIAGNOSIS — F1020 Alcohol dependence, uncomplicated: Secondary | ICD-10-CM | POA: Diagnosis not present
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
## 2021-07-19 NOTE — Plan of Care (Signed)
Terri Ayala agrees to the following Care Plan: Problem: Alcohol Goal:  Terri Ayala will report being able to abstain from alcohol 7/7 days based on self-report and per negative UDS  by 11/19/2021.  Outcome: Not Applicable

## 2021-07-21 ENCOUNTER — Ambulatory Visit: Payer: Medicare Other | Admitting: Physician Assistant

## 2021-07-21 ENCOUNTER — Encounter (HOSPITAL_COMMUNITY): Payer: Self-pay | Admitting: Orthopedic Surgery

## 2021-07-21 ENCOUNTER — Other Ambulatory Visit: Payer: Self-pay

## 2021-07-21 DIAGNOSIS — Z1231 Encounter for screening mammogram for malignant neoplasm of breast: Secondary | ICD-10-CM

## 2021-07-21 NOTE — H&P (Incomplete)
                                 Orthopaedic Trauma Service Progress Note  Patient ID: Terri Ayala MRN: [id number] DOB/AGE: 1955/11/26 66 y.o.  Subjective:  Patient reports pain as {DEGREE - MILD, MOD, SEV:20224}.   Patient reports pain as {pain:3041404}.  ***  ***(cosigner if appropriate)   ROS  Objective:   VITALS:  There were no vitals filed for this visit.  Estimated body mass index is 28.12 kg/m as calculated from the following:   Height as of 07/07/21: 5' (1.524 m).   Weight as of 07/07/21: 65.3 kg.   Intake/Output    None     LABS  No results found for this or any previous visit (from the past 24 hour(s)).   PHYSICAL EXAM:   ***  Gen:      Orientation: ***      Mood and Affect: ***      Gait: ***      Coordination and balance: *** Lungs: Cardiac: Abd: Pelvis: Ext:  Ortho Exam  Incision: {CHL IP ORTHO INCISION [id number]  Assessment/Plan:     Active Problems:   * No active hospital problems. *   Anti-infectives (From admission, onward)    None     .  POD/HD#:    -(ortho injury):  - Pain management:  *** - ABL anemia/Hemodynamics  *** - Medical issues   *** - DVT/PE prophylaxis:  *** - ID:   *** - Metabolic Bone Disease:  *** - Activity:  *** - FEN/GI prophylaxis/Foley/Lines:  *** -Ex-fix/Splint care:  *** - Impediments to fracture healing:  *** - Dispo:  ***  {ORTHOADMISSIONSTATUS:21269}   Weightbearing: {weightbearing/status:21254} {affected extremity :21255} ROM: Insicional and dressing care: {Insicional and dressing care:21256} Pain management: *** Foley/lines: *** ID:*** Impediments to Fracture Healing: *** Bone Health/Optimization: ***  Orthopedic device(s): {CHL orthopedic devices:21267::"None"}  Showering: ***  VTE prophylaxis: {Type:21257} {Duration:21258}  Dispo: *** D/C recommendations:  Follow - up plan: {Follow-up plan:21259}  Contact  information:  *** MD, *** PA   Jari Pigg, PA-C [phone] (C) 07/21/2021, 10:36 AM  [facility] [place] [id number] [phone] Jenetta [phone] (F)    After 5pm and on the weekends please log on to Amion, go to orthopaedics and the look under the [facility] Call for the provider(s) on call. You can also call our office at [phone] and then follow the prompts to be connected to the call team.

## 2021-07-21 NOTE — Anesthesia Preprocedure Evaluation (Addendum)
Anesthesia Evaluation  Patient identified by MRN, date of birth, ID band Patient awake    Reviewed: Allergy & Precautions, NPO status , Patient's Chart, lab work & pertinent test results  History of Anesthesia Complications (+) PONV and history of anesthetic complications  Airway Mallampati: II  TM Distance: >3 FB Neck ROM: Full    Dental no notable dental hx.    Pulmonary neg pulmonary ROS, former smoker,    Pulmonary exam normal breath sounds clear to auscultation       Cardiovascular hypertension, Pt. on medications + dysrhythmias Supra Ventricular Tachycardia  Rhythm:Regular Rate:Tachycardia  Sinus tachycardia; rate 130-140s Otherwise normal ECG Since last tracing of earlier today No significant change was found Confirmed by End, Christopher [phone]) on 06/11/2021 7:03:55 AM   Neuro/Psych  Headaches, PSYCHIATRIC DISORDERS Anxiety Depression  Neuromuscular disease ( lumbar radiculopathy ) negative psych ROS   GI/Hepatic negative GI ROS, GERD  ,(+)     substance abuse  alcohol use, Completed inpatient ETOH detox on Monday 07/19/21, has plans for outpatient treatment after surgery. Patient states she has not had any alcohol since being discharged on Monday.    Endo/Other  goiter  Renal/GU negative Renal ROS  negative genitourinary   Musculoskeletal  (+) Arthritis , Osteoarthritis,    Abdominal   Peds negative pediatric ROS (+)  Hematology negative hematology ROS (+)   Anesthesia Other Findings   Reproductive/Obstetrics negative OB ROS                           Anesthesia Physical Anesthesia Plan  ASA: 3  Anesthesia Plan: General   Post-op Pain Management: Tylenol PO (pre-op)*   Induction: Intravenous  PONV Risk Score and Plan: 4 or greater and Midazolam, Scopolamine patch - Pre-op, Treatment may vary due to age or medical condition, Ondansetron and Dexamethasone  Airway  Management Planned: LMA  Additional Equipment: None  Intra-op Plan:   Post-operative Plan: Extubation in OR  Informed Consent: I have reviewed the patients History and Physical, chart, labs and discussed the procedure including the risks, benefits and alternatives for the proposed anesthesia with the patient or authorized representative who has indicated his/her understanding and acceptance.     Dental advisory given  Plan Discussed with: Anesthesiologist and CRNA  Anesthesia Plan Comments: (PAT note written 07/21/2021 by Myra Gianotti, PA-C. Completed inpatient alcohol detox 07/17/21.)      Anesthesia Quick Evaluation

## 2021-07-21 NOTE — Progress Notes (Addendum)
PCP - PA-C Mariel Kansky, M  Cardiologist - Denies  EP- Denies  Endocrine- Denies  Pulm- Denies  Chest x-ray - 06/11/21 (E)  EKG - 02/10/21 (E)  Stress Test - 07/01/11 (E)  ECHO - 09/27/20 (E)  Cardiac Cath - 05/07/2018 (E)  AICD-na PM-na LOOP-na  Nerve Stimulator- Denies  Dialysis- Denies  Sleep Study - Denies CPAP - Denies  LABS- 07/22/21: CMP 07/13/21(CE): CBC w/D, ETOH  ASA- Denies  ERAS- No  HA1C- Denies  The pt states she has not had any alcohol since going through detox on 07/17/21.  Anesthesia- Yes- previous consult- PA notified  Pt denies having chest pain, sob, or fever during the pre-op phone call. All instructions explained to the pt, with a verbal understanding of the material including: as of today, stop taking all Aspirin (unless instructed by your doctor) and Other Aspirin containing products, Vitamins, Fish oils, and Herbal medications. Also stop all NSAIDS i.e. Advil, Ibuprofen, Motrin, Aleve, Anaprox, Naproxen, BC, Goody Powders, and all Supplements.  Pt also instructed to wear a mask and social distance if she goes out. The opportunity to ask questions was provided.

## 2021-07-21 NOTE — Progress Notes (Signed)
Pt called back to state that due to transportation, she will not arrive until 6am for her surgery on 07/22/21.

## 2021-07-21 NOTE — Progress Notes (Signed)
Anesthesia APP Follow-up Chart Review: Terri Ayala   Case: [id number] Date/Time: 07/22/21 0745   Procedure: HARDWARE REMOVAL (Left)   Anesthesia type: Choice   Pre-op diagnosis: SYMPTOMATIC HARDWARE LEFT ANKLE   Location: [facility] OR ROOM 03 / [place] OR   Surgeons: Altamese Leslie, MD       DISCUSSION: Patient is a 66 year old female scheduled for the above procedure. Surgery was initially scheduled for 07/15/21, but notes suggest her PCP recommended she complete alcohol detox prior to surgery, given her frequent falls, and she was admitted to [facility] 07/13/21-07/17/21. Per Discharge Summary, she has a longstanding history of drinking 1-2 bottle of wine daily, and longest sober was 2 years. She was intoxicated on arrival with BAL 393, GGT 247, and elevated LFTs. She was monitored for withdrawal with CIWA protocol and prescribed gabapentin TID and home medications resumed. She completed detox with plans to start IOP program on 07/21/21 and medication management through [facility] psychiatrist.    History includes former smoker (quit 02/28/74), post-operative N/V, HTN, HLD, multinodular goiter, SVT, MVP, alcoholic fatty liver disease, alcoholism with alcohol withdrawal (completed in-patient detox 07/17/21), GERD, dysphagia, chronic low back pain, migraines.   Currently, her last known labs are from 07/13/21 when she was first admitted to [place] for alcohol detox. Her UDS and hepatitis panel were negative. Her CBC showed WBC 5.3, H/H 13.7/41.4, PLT 256K. Her CMET showed glucose 150, Cr 0.68, albumin 4.5, total bilirubin 0.4, AST 2015, ALT 82, anion gap 17 (in the setting of acute alcohol intoxication). Will plan repeat CMET on the day of surgery.    VS: Per [facility] 07/17/21 Discharge Summary: Temp: 97.8 F (36.6 C) Pulse: 110  Resp: 18 BP: 132/62 SpO2: 100 %    PROVIDERS: Abonza, Maritza, PA-C is PCP  - It does not appear that she is routinely followed by cardiology but last saw Quay Burow,  MD in March 2020 and had no significant CAD by coronary catheterization.  She had previously seen Dr. Lauree Chandler and EP cardiologist Dr. Crissie Sickles for history of SVT.    LABS: As above.  Patient admitted to [facility] 07/13/21-07/17/21 for alcohol detox. See Care Everywhere for labs done there on 07/13/2021.  LFTs were elevated.  UDS was negative.     IMAGES: CT C-spine 06/08/21: IMPRESSION: No acute fracture or traumatic malalignment of the cervical spine.   CT Head 06/08/21: IMPRESSION: No acute intracranial pathology.   Xray left ankle 02/11/21: IMPRESSION: Interval internal fixation of bimalleolar fractures with anatomic alignment.     EKG: 06/11/21:  Sinus tachycardia at 137 bpm Otherwise normal ECG Since last tracing of earlier today No significant change was found Confirmed by End, Christopher [phone]) on 06/11/2021 7:03:55 AM     CV: Echo 09/27/20: IMPRESSIONS   1. Left ventricular ejection fraction, by estimation, is 65 to 70%. The  left ventricle has normal function. The left ventricle has no regional  wall motion abnormalities. Left ventricular diastolic parameters are  indeterminate (difficult tissue Doppler  acquisition).   2. Right ventricular systolic function is hyperdynamic. The right  ventricular size is normal. Tricuspid regurgitation signal is inadequate  for assessing PA pressure.   3. The mitral valve is normal in structure. No evidence of mitral valve  regurgitation. No evidence of mitral stenosis.   4. The aortic valve was not well visualized. Aortic valve regurgitation  is not visualized. No aortic stenosis is present.   5. The inferior vena cava is normal in  size with greater than 50%  respiratory variability, suggesting right atrial pressure of 3 mmHg.  - Comparison(s): A prior study was performed on 05/05/2018. LV and RV function  is more vigorous.      Cardiac cath 05/07/18: Right dominant coronary anatomy Normal left main Widely  patent LAD with diffuse luminal irregularities throughout the mid and distal vessel.  No focally obstructive CAD is noted. Widely patent circumflex without focally obstructive CAD. Widely patent right coronary without evidence of focally obstructive disease. Normal LV systolic function with LVEF 65%.  LVEDP is normal.   RECOMMENDATIONS: No obstructive coronary disease is identified. Highly tortuous coronary arteries. Consider alternative explanations for chest discomfort. Doubt INOCA.     Event monitor 01/16/12-02/05/12: Normal sinus rhythm.  No PACs, PVCs.    Past Medical History:  Diagnosis Date   Alcohol withdrawal ([place]) 11/2017; 01/04/2018   Alcoholism ([place])    Anxiety    Arthritis    "hands" (01/04/2018)   ASYMPTOMATIC POSTMENOPAUSAL STATUS 08/17/2009   Chronic lower back pain    Depression    DYSPHAGIA 06/19/2007   Fatty liver, alcoholic    GERD (gastroesophageal reflux disease)    GOITER, MULTINODULAR 08/17/2009   Hyperlipidemia    Hypertension    Migraines    "not sure what triggers them; I'll have 1 q couple weeks or more; come 2 days in a row when they come" (01/04/2018)   Mitral valve prolapse    OSTEOPOROSIS 08/17/2009   PONV (postoperative nausea and vomiting)    Supraventricular tachycardia ([place])     Past Surgical History:  Procedure Laterality Date   BREAST BIOPSY Right    "benign"   CATARACT EXTRACTION W/ INTRAOCULAR LENS  IMPLANT, BILATERAL     CERVICAL CONE BIOPSY  2000s   CERVIX LESION DESTRUCTION  1991   "dysplasia; lesions"   CHOLECYSTECTOMY N/A 03/14/2013   Procedure: LAPAROSCOPIC CHOLECYSTECTOMY WITH ATTEMPTED INTRAOPERATIVE CHOLANGIOGRAM;  Surgeon: Harl Bowie, MD;  Location: [place];  Service: General;  Laterality: N/A;   laporoscopic abdominal surgery     "for endometriosis"   LEFT HEART CATH AND CORONARY ANGIOGRAPHY N/A 05/07/2018   Procedure: LEFT HEART CATH AND CORONARY ANGIOGRAPHY;  Surgeon: Belva Crome, MD;  Location: [employer]  CV LAB;  Service: Cardiovascular;  Laterality: N/A;   ORIF ANKLE FRACTURE Left 02/11/2021   Procedure: OPEN REDUCTION INTERNAL FIXATION (ORIF) ANKLE FRACTURE;  Surgeon: Altamese Choccolocco, MD;  Location: [place];  Service: Orthopedics;  Laterality: Left;   TONSILLECTOMY      MEDICATIONS: No current facility-administered medications for this encounter.    albuterol (VENTOLIN HFA) 108 (90 Base) MCG/ACT inhaler   alendronate (FOSAMAX) 70 MG tablet   baclofen (LIORESAL) 10 MG tablet   busPIRone (BUSPAR) 15 MG tablet   Calcium Carbonate-Vitamin D (CALCIUM 600+D PO)   carvedilol (COREG) 3.125 MG tablet   DULoxetine (CYMBALTA) 60 MG capsule   fluticasone (FLONASE) 50 MCG/ACT nasal spray   folic acid (FOLVITE) 1 MG tablet   gabapentin (NEURONTIN) 100 MG capsule   hydrOXYzine (ATARAX/VISTARIL) 25 MG tablet   meloxicam (MOBIC) 15 MG tablet   Multiple Vitamins-Minerals (ADULT GUMMY PO)   norethindrone-ethinyl estradiol (FEMHRT 1/5) 1-5 MG-MCG TABS tablet   pantoprazole (PROTONIX) 40 MG tablet   SUMAtriptan (IMITREX) 50 MG tablet   traZODone (DESYREL) 50 MG tablet   diazepam (VALIUM) 10 MG tablet   thiamine 100 MG tablet    Myra Gianotti, PA-C Surgical Short Stay/Anesthesiology [facility] Phone [phone] [facility] Phone [phone]  07/21/2021 11:39 AM

## 2021-07-22 ENCOUNTER — Ambulatory Visit (HOSPITAL_COMMUNITY)
Admission: RE | Admit: 2021-07-22 | Discharge: 2021-07-22 | Disposition: A | Discharge status: Home / Self Care | Payer: Medicare Other | Attending: Orthopedic Surgery | Admitting: Orthopedic Surgery

## 2021-07-22 ENCOUNTER — Ambulatory Visit (HOSPITAL_COMMUNITY): Payer: Medicare Other

## 2021-07-22 ENCOUNTER — Encounter (HOSPITAL_COMMUNITY)
Admission: RE | Disposition: A | Discharge status: Home / Self Care | Payer: Self-pay | Source: Home / Self Care | Attending: Orthopedic Surgery

## 2021-07-22 ENCOUNTER — Ambulatory Visit (HOSPITAL_BASED_OUTPATIENT_CLINIC_OR_DEPARTMENT_OTHER): Payer: Medicare Other | Admitting: Vascular Surgery

## 2021-07-22 ENCOUNTER — Other Ambulatory Visit (HOSPITAL_COMMUNITY): Payer: Self-pay

## 2021-07-22 ENCOUNTER — Ambulatory Visit (HOSPITAL_COMMUNITY): Payer: Medicare Other | Admitting: Vascular Surgery

## 2021-07-22 ENCOUNTER — Other Ambulatory Visit: Payer: Self-pay

## 2021-07-22 ENCOUNTER — Encounter (HOSPITAL_COMMUNITY): Payer: Self-pay | Admitting: Orthopedic Surgery

## 2021-07-22 DIAGNOSIS — T8484XA Pain due to internal orthopedic prosthetic devices, implants and grafts, initial encounter: Secondary | ICD-10-CM | POA: Insufficient documentation

## 2021-07-22 DIAGNOSIS — X58XXXA Exposure to other specified factors, initial encounter: Secondary | ICD-10-CM | POA: Diagnosis not present

## 2021-07-22 DIAGNOSIS — I10 Essential (primary) hypertension: Secondary | ICD-10-CM

## 2021-07-22 DIAGNOSIS — F32A Depression, unspecified: Secondary | ICD-10-CM | POA: Diagnosis not present

## 2021-07-22 DIAGNOSIS — M1990 Unspecified osteoarthritis, unspecified site: Secondary | ICD-10-CM | POA: Insufficient documentation

## 2021-07-22 DIAGNOSIS — F418 Other specified anxiety disorders: Secondary | ICD-10-CM

## 2021-07-22 DIAGNOSIS — I471 Supraventricular tachycardia: Secondary | ICD-10-CM

## 2021-07-22 DIAGNOSIS — Z87891 Personal history of nicotine dependence: Secondary | ICD-10-CM | POA: Diagnosis not present

## 2021-07-22 DIAGNOSIS — K219 Gastro-esophageal reflux disease without esophagitis: Secondary | ICD-10-CM | POA: Insufficient documentation

## 2021-07-22 DIAGNOSIS — F419 Anxiety disorder, unspecified: Secondary | ICD-10-CM | POA: Insufficient documentation

## 2021-07-22 DIAGNOSIS — Z79899 Other long term (current) drug therapy: Secondary | ICD-10-CM | POA: Insufficient documentation

## 2021-07-22 HISTORY — DX: Essential (primary) hypertension: I10

## 2021-07-22 HISTORY — PX: HARDWARE REMOVAL: SHX979

## 2021-07-22 LAB — SURGICAL PCR SCREEN
MRSA, PCR: NEGATIVE
Staphylococcus aureus: NEGATIVE

## 2021-07-22 SURGERY — REMOVAL, HARDWARE
Anesthesia: General | Site: Ankle | Laterality: Left

## 2021-07-22 MED ORDER — SCOPOLAMINE 1 MG/3DAYS TD PT72
1.0000 | MEDICATED_PATCH | TRANSDERMAL | Status: DC
  Administered 2021-07-22: 1.5 mg via TRANSDERMAL
  Filled 2021-07-22: qty 1

## 2021-07-22 MED ORDER — PROPOFOL 10 MG/ML IV BOLUS
INTRAVENOUS | Status: AC
  Filled 2021-07-22: qty 20

## 2021-07-22 MED ORDER — BUPIVACAINE HCL (PF) 0.25 % IJ SOLN
INTRAMUSCULAR | Status: AC
  Filled 2021-07-22: qty 30

## 2021-07-22 MED ORDER — BUPIVACAINE HCL 0.25 % IJ SOLN
INTRAMUSCULAR | Status: DC | PRN
  Administered 2021-07-22: 20 mL

## 2021-07-22 MED ORDER — OXYCODONE HCL 5 MG/5ML PO SOLN: 5.0000 mg | Freq: Once | ORAL | Status: AC | PRN

## 2021-07-22 MED ORDER — CEFAZOLIN SODIUM-DEXTROSE 2-4 GM/100ML-% IV SOLN
2.0000 g | INTRAVENOUS | Status: AC
  Administered 2021-07-22: 2 g via INTRAVENOUS
  Filled 2021-07-22: qty 100

## 2021-07-22 MED ORDER — LIDOCAINE 2% (20 MG/ML) 5 ML SYRINGE
INTRAMUSCULAR | Status: DC | PRN
  Administered 2021-07-22: 80 mg via INTRAVENOUS

## 2021-07-22 MED ORDER — PROPOFOL 10 MG/ML IV BOLUS
INTRAVENOUS | Status: DC | PRN
  Administered 2021-07-22: 200 mg via INTRAVENOUS

## 2021-07-22 MED ORDER — EPHEDRINE SULFATE-NACL 50-0.9 MG/10ML-% IV SOSY
PREFILLED_SYRINGE | INTRAVENOUS | Status: DC | PRN
  Administered 2021-07-22 (×3): 5 mg via INTRAVENOUS
  Administered 2021-07-22: 10 mg via INTRAVENOUS

## 2021-07-22 MED ORDER — FENTANYL CITRATE (PF) 250 MCG/5ML IJ SOLN
INTRAMUSCULAR | Status: AC
  Filled 2021-07-22: qty 5

## 2021-07-22 MED ORDER — CHLORHEXIDINE GLUCONATE 0.12 % MT SOLN
15.0000 mL | Freq: Once | OROMUCOSAL | Status: AC
  Administered 2021-07-22: 15 mL via OROMUCOSAL
  Filled 2021-07-22: qty 15

## 2021-07-22 MED ORDER — AMISULPRIDE (ANTIEMETIC) 5 MG/2ML IV SOLN: 10.0000 mg | Freq: Once | INTRAVENOUS | Status: DC | PRN

## 2021-07-22 MED ORDER — DEXAMETHASONE SODIUM PHOSPHATE 10 MG/ML IJ SOLN
INTRAMUSCULAR | Status: DC | PRN
  Administered 2021-07-22: 10 mg via INTRAVENOUS

## 2021-07-22 MED ORDER — CALCIUM CHLORIDE 10 % IV SOLN
INTRAVENOUS | Status: AC
  Filled 2021-07-22: qty 10

## 2021-07-22 MED ORDER — MIDAZOLAM HCL 2 MG/2ML IJ SOLN
INTRAMUSCULAR | Status: DC | PRN
  Administered 2021-07-22 (×2): 1 mg via INTRAVENOUS

## 2021-07-22 MED ORDER — FENTANYL CITRATE (PF) 250 MCG/5ML IJ SOLN
INTRAMUSCULAR | Status: DC | PRN
  Administered 2021-07-22 (×2): 50 ug via INTRAVENOUS

## 2021-07-22 MED ORDER — 0.9 % SODIUM CHLORIDE (POUR BTL) OPTIME
TOPICAL | Status: DC | PRN
  Administered 2021-07-22: 1000 mL

## 2021-07-22 MED ORDER — ORAL CARE MOUTH RINSE: 15.0000 mL | Freq: Once | OROMUCOSAL | Status: AC

## 2021-07-22 MED ORDER — FENTANYL CITRATE (PF) 100 MCG/2ML IJ SOLN
25.0000 ug | INTRAMUSCULAR | Status: DC | PRN
  Administered 2021-07-22: 50 ug via INTRAVENOUS

## 2021-07-22 MED ORDER — PHENYLEPHRINE 80 MCG/ML (10ML) SYRINGE FOR IV PUSH (FOR BLOOD PRESSURE SUPPORT)
PREFILLED_SYRINGE | INTRAVENOUS | Status: AC
  Filled 2021-07-22: qty 10

## 2021-07-22 MED ORDER — OXYCODONE HCL 5 MG PO TABS
ORAL_TABLET | ORAL | Status: AC
  Filled 2021-07-22: qty 1

## 2021-07-22 MED ORDER — LACTATED RINGERS IV SOLN: INTRAVENOUS | Status: DC

## 2021-07-22 MED ORDER — GLYCOPYRROLATE 0.2 MG/ML IJ SOLN
INTRAMUSCULAR | Status: DC | PRN
  Administered 2021-07-22: .2 mg via INTRAVENOUS

## 2021-07-22 MED ORDER — HYDROCODONE-ACETAMINOPHEN 5-325 MG PO TABS
1.0000 | ORAL_TABLET | Freq: Two times a day (BID) | ORAL | 0 refills | Status: DC | PRN
Start: 2021-07-22 — End: 2021-08-20
  Filled 2021-07-22: qty 28, 7d supply, fill #0

## 2021-07-22 MED ORDER — ACETAMINOPHEN 500 MG PO TABS
1000.0000 mg | ORAL_TABLET | Freq: Once | ORAL | Status: AC
  Administered 2021-07-22: 1000 mg via ORAL
  Filled 2021-07-22: qty 2

## 2021-07-22 MED ORDER — MIDAZOLAM HCL 2 MG/2ML IJ SOLN
INTRAMUSCULAR | Status: AC
  Filled 2021-07-22: qty 2

## 2021-07-22 MED ORDER — PHENYLEPHRINE 80 MCG/ML (10ML) SYRINGE FOR IV PUSH (FOR BLOOD PRESSURE SUPPORT)
PREFILLED_SYRINGE | INTRAVENOUS | Status: DC | PRN
  Administered 2021-07-22: 80 ug via INTRAVENOUS

## 2021-07-22 MED ORDER — ONDANSETRON HCL 4 MG/2ML IJ SOLN
INTRAMUSCULAR | Status: DC | PRN
  Administered 2021-07-22: 4 mg via INTRAVENOUS

## 2021-07-22 MED ORDER — FENTANYL CITRATE (PF) 100 MCG/2ML IJ SOLN
INTRAMUSCULAR | Status: AC
  Filled 2021-07-22: qty 2

## 2021-07-22 MED ORDER — ACETAMINOPHEN 500 MG PO TABS
500.0000 mg | ORAL_TABLET | Freq: Three times a day (TID) | ORAL | 0 refills | Status: AC
  Filled 2021-07-22: qty 21, 7d supply, fill #0

## 2021-07-22 MED ORDER — ONDANSETRON HCL 4 MG/2ML IJ SOLN
INTRAMUSCULAR | Status: AC
  Filled 2021-07-22: qty 2

## 2021-07-22 MED ORDER — OXYCODONE HCL 5 MG PO TABS
5.0000 mg | ORAL_TABLET | Freq: Once | ORAL | Status: AC | PRN
  Administered 2021-07-22: 5 mg via ORAL

## 2021-07-22 SURGICAL SUPPLY — 65 items
BAG COUNTER SPONGE SURGICOUNT (BAG) ×2 IMPLANT
BAG SPNG CNTER NS LX DISP (BAG)
BANDAGE ESMARK 6X9 LF (GAUZE/BANDAGES/DRESSINGS) ×2 IMPLANT
BNDG CMPR 9X6 STRL LF SNTH (GAUZE/BANDAGES/DRESSINGS)
BNDG COHESIVE 6X5 TAN STRL LF (GAUZE/BANDAGES/DRESSINGS) ×2 IMPLANT
BNDG ELASTIC 4X5.8 VLCR STR LF (GAUZE/BANDAGES/DRESSINGS) ×3 IMPLANT
BNDG ELASTIC 6X5.8 VLCR STR LF (GAUZE/BANDAGES/DRESSINGS) ×2 IMPLANT
BNDG ESMARK 6X9 LF (GAUZE/BANDAGES/DRESSINGS)
BNDG GAUZE ELAST 4 BULKY (GAUZE/BANDAGES/DRESSINGS) ×5 IMPLANT
BRUSH SCRUB EZ PLAIN DRY (MISCELLANEOUS) ×5 IMPLANT
COVER SURGICAL LIGHT HANDLE (MISCELLANEOUS) ×5 IMPLANT
CUFF TOURN SGL QUICK 18X4 (TOURNIQUET CUFF) IMPLANT
CUFF TOURN SGL QUICK 24 (TOURNIQUET CUFF)
CUFF TOURN SGL QUICK 34 (TOURNIQUET CUFF)
CUFF TRNQT CYL 24X4X16.5-23 (TOURNIQUET CUFF) IMPLANT
CUFF TRNQT CYL 34X4.125X (TOURNIQUET CUFF) IMPLANT
DRAPE C-ARM 42X72 X-RAY (DRAPES) IMPLANT
DRAPE C-ARMOR (DRAPES) ×3 IMPLANT
DRAPE U-SHAPE 47X51 STRL (DRAPES) ×3 IMPLANT
DRSG ADAPTIC 3X8 NADH LF (GAUZE/BANDAGES/DRESSINGS) ×2 IMPLANT
ELECT REM PT RETURN 9FT ADLT (ELECTROSURGICAL) ×2
ELECTRODE REM PT RTRN 9FT ADLT (ELECTROSURGICAL) ×2 IMPLANT
GAUZE SPONGE 4X4 12PLY STRL (GAUZE/BANDAGES/DRESSINGS) ×3 IMPLANT
GAUZE SPONGE 4X4 12PLY STRL LF (GAUZE/BANDAGES/DRESSINGS) ×1 IMPLANT
GLOVE BIO SURGEON STRL SZ7.5 (GLOVE) ×3 IMPLANT
GLOVE BIO SURGEON STRL SZ8 (GLOVE) ×3 IMPLANT
GLOVE BIOGEL PI IND STRL 7.5 (GLOVE) ×2 IMPLANT
GLOVE BIOGEL PI IND STRL 8 (GLOVE) ×2 IMPLANT
GLOVE BIOGEL PI INDICATOR 7.5 (GLOVE)
GLOVE BIOGEL PI INDICATOR 8 (GLOVE) ×1
GLOVE SURG ORTHO LTX SZ7.5 (GLOVE) ×5 IMPLANT
GOWN STRL REUS W/ TWL LRG LVL3 (GOWN DISPOSABLE) ×4 IMPLANT
GOWN STRL REUS W/ TWL XL LVL3 (GOWN DISPOSABLE) ×2 IMPLANT
GOWN STRL REUS W/TWL LRG LVL3 (GOWN DISPOSABLE) ×4
GOWN STRL REUS W/TWL XL LVL3 (GOWN DISPOSABLE) ×2
KIT BASIN OR (CUSTOM PROCEDURE TRAY) ×3 IMPLANT
KIT TURNOVER KIT B (KITS) ×3 IMPLANT
MANIFOLD NEPTUNE II (INSTRUMENTS) ×2 IMPLANT
NEEDLE 22X1 1/2 (OR ONLY) (NEEDLE) ×1 IMPLANT
NS IRRIG 1000ML POUR BTL (IV SOLUTION) ×3 IMPLANT
PACK ORTHO EXTREMITY (CUSTOM PROCEDURE TRAY) ×3 IMPLANT
PAD ABD 8X10 STRL (GAUZE/BANDAGES/DRESSINGS) ×2 IMPLANT
PAD ARMBOARD 7.5X6 YLW CONV (MISCELLANEOUS) ×6 IMPLANT
PAD CAST 4YDX4 CTTN HI CHSV (CAST SUPPLIES) IMPLANT
PADDING CAST COTTON 4X4 STRL (CAST SUPPLIES) ×2
PADDING CAST COTTON 6X4 STRL (CAST SUPPLIES) ×6 IMPLANT
SPONGE T-LAP 18X18 ~~LOC~~+RFID (SPONGE) ×3 IMPLANT
STAPLER VISISTAT 35W (STAPLE) IMPLANT
STOCKINETTE IMPERVIOUS LG (DRAPES) ×2 IMPLANT
STRIP CLOSURE SKIN 1/2X4 (GAUZE/BANDAGES/DRESSINGS) IMPLANT
SUCTION FRAZIER HANDLE 10FR (MISCELLANEOUS)
SUCTION TUBE FRAZIER 10FR DISP (MISCELLANEOUS) IMPLANT
SUT ETHILON 2 0 FS 18 (SUTURE) ×1 IMPLANT
SUT PDS AB 2-0 CT1 27 (SUTURE) IMPLANT
SUT VIC AB 0 CT1 27 (SUTURE)
SUT VIC AB 0 CT1 27XBRD ANBCTR (SUTURE) IMPLANT
SUT VIC AB 2-0 CT1 27 (SUTURE) ×2
SUT VIC AB 2-0 CT1 TAPERPNT 27 (SUTURE) IMPLANT
SYR CONTROL 10ML LL (SYRINGE) ×1 IMPLANT
TOWEL GREEN STERILE (TOWEL DISPOSABLE) ×5 IMPLANT
TOWEL GREEN STERILE FF (TOWEL DISPOSABLE) ×4 IMPLANT
TUBE CONNECTING 12X1/4 (SUCTIONS) ×3 IMPLANT
UNDERPAD 30X36 HEAVY ABSORB (UNDERPADS AND DIAPERS) ×3 IMPLANT
WATER STERILE IRR 1000ML POUR (IV SOLUTION) ×4 IMPLANT
YANKAUER SUCT BULB TIP NO VENT (SUCTIONS) ×3 IMPLANT

## 2021-07-22 NOTE — Op Note (Signed)
07/22/2021  1:23 PM  PATIENT:  Terri Ayala  Feb 14, 1956 female   MEDICAL RECORD NUMBER: [id number]  PRE-OPERATIVE DIAGNOSIS:  SYMPTOMATIC HARDWARE LEFT ANKLE  POST-OPERATIVE DIAGNOSIS:  SYMPTOMATIC HARDWARE LEFT ANKLE  PROCEDURE:   REMOVAL OF DEEP IMPLANT LEFT FIBULA REMOVAL OF DEEP IMPLANT LEFT TIBIA MANUAL APPLICATION OF STRESS ANKLE SYNDESMOSIS UNDER FLUOROSCOPY  SURGEON:  Astrid Divine. Marcelino Scot, M.D.  ASSISTANTMarlynn Perking.  ANESTHESIA:  General.  COMPLICATIONS:  None.  TOURNIQUET: None.  ESTIMATED BLOOD LOSS:  MINIMAL.  DISPOSITION:  To PACU.  CONDITION:  Stable.  DELAY START OF DVT PROPHYLAXIS BECAUSE OF BLEEDING RISK: NO    BRIEF SUMMARY OF INDICATION FOR PROCEDURE:  Patient is a pleasant 66 y.o. who underwent plate fixation of a fracture with subsequent healing. Despite conservative measures, hardware related symptoms have persisted. Therefore, I discussed with the patient the risks and benefits of surgical removal including infection, nerve or vessel injury, failure to alleviate symptoms, occult nonunion, re-fracture, DVT, PE, and multiple others. She did wish to proceed.   BRIEF SUMMARY OF PROCEDURE:  The patient was taken to the operating room after administration of 2 g of Ancef.  General anesthesia was induced. The left lower extremity was prepped and draped in usual sterile fashion.  No tourniquet was used during the procedure.  C-arm was brought in to confirm position of the hardware.  I remade the old distal incision over the distal tibia medially and dissected sharply down to the plate, elevating the soft tissues. I identified and removed all screws and then was able to remove the wire sled as well.  X-rays confirmed removal of all hardware and a healed tibia fracture.   I then made long incision through the scar overlying the left fibula and dissected sharply down to the plate, elevating the soft tissues. I identified and removed all screws including the one across  the syndesmosis. The plate was gently rocked to assist with this and then extracted atraumatically. Final x-rays confirmed removal of all hardware and a healed fracture. Because of the patient's ankle pain and prior syndesmosis injury suggesting possible instability, a stress evaluation was performed, consisting of external rotation of the ankle while holding it in the mortise view. Under live fluoro, I did not identify  any widening of the medial clear space nor widening of the syndesmotic interval. Consequently it was deemed stable.  The wounds were irrigated thoroughly and closed in standard fashion with vicryl and nylon. A sterile gently compressive dressing was applied.  The patient was taken to the PACU in stable condition.     PROGNOSIS: Patient will be weightbearing as tolerated with aggressive active and passive motion of the knee and ankle. Bleeding would be anticipated. She may change or remove his dressing in 48 hours and shower. Patient will follow up in 10-14 days for removal of sutures.       Astrid Divine. Marcelino Scot, M.D.

## 2021-07-22 NOTE — Discharge Instructions (Addendum)
Orthopaedic Trauma Service Discharge Instructions   General Discharge Instructions   WEIGHT BEARING STATUS: Weight-bear as tolerated left leg, may need to use crutches for a few days due to soreness  RANGE OF MOTION/ACTIVITY: Unrestricted ankle motion.  Activity as tolerated.  Slowly increase activity level  Wound Care: Daily wound care starting on 07/25/2021.  See below  Discharge Wound Care Instructions  Do NOT apply any ointments, solutions or lotions to pin sites or surgical wounds.  These prevent needed drainage and even though solutions like hydrogen peroxide kill bacteria, they also damage cells lining the pin sites that help fight infection.  Applying lotions or ointments can keep the wounds moist and can cause them to breakdown and open up as well. This can increase the risk for infection. When in doubt call the office.  Surgical incisions should be dressed daily.  If any drainage is noted, use one layer of adaptic or Mepitel, then gauze, Kerlix, and an ace wrap.  [email or web address]  CheapWipes.gl  These dressing supplies should be available at local medical supply stores ([employer], [employer], etc). They are not usually carried at places like [facility], [facility], [employer], etc  Once the incision is completely dry and without drainage, it may be left open to air out.  Showering may begin 36-48 hours later.  Cleaning gently with soap and water.   Diet: as you were eating previously.  Can use over the counter stool softeners and bowel preparations, such as Miralax, to help with bowel movements.  Narcotics can be constipating.  Be sure to drink plenty of fluids  PAIN MEDICATION USE AND EXPECTATIONS  You have likely been given  narcotic medications to help control your pain.  After a traumatic event that results in an fracture (broken bone) with or without surgery, it is ok to use narcotic pain medications to help control one's pain.  We understand that everyone responds to pain differently and each individual patient will be evaluated on a regular basis for the continued need for narcotic medications. Ideally, narcotic medication use should last no more than 6-8 weeks (coinciding with fracture healing).   As a patient it is your responsibility as well to monitor narcotic medication use and report the amount and frequency you use these medications when you come to your office visit.   We would also advise that if you are using narcotic medications, you should take a dose prior to therapy to maximize you participation.  IF YOU ARE ON NARCOTIC MEDICATIONS IT IS NOT PERMISSIBLE TO OPERATE A MOTOR VEHICLE (MOTORCYCLE/CAR/TRUCK/MOPED) OR HEAVY MACHINERY DO NOT MIX NARCOTICS WITH OTHER CNS (CENTRAL NERVOUS SYSTEM) DEPRESSANTS SUCH AS ALCOHOL   POST-OPERATIVE OPIOID TAPER INSTRUCTIONS: It is important to wean off of your opioid medication as soon as possible. If you do not need pain medication after your surgery it is ok to stop day one. Opioids include: Codeine, Hydrocodone(Norco, Vicodin), Oxycodone(Percocet, oxycontin) and hydromorphone amongst others.  Long term and even short term use of opiods can cause: Increased pain response Dependence Constipation Depression Respiratory depression And more.  Withdrawal symptoms can include Flu like symptoms Nausea, vomiting And more Techniques to manage these symptoms Hydrate well Eat regular healthy meals Stay active Use relaxation techniques(deep breathing, meditating, yoga) Do Not substitute Alcohol to help with tapering If you have been on opioids for less than two weeks and do not have pain than it is ok to stop all together.  Plan to wean off of opioids This plan  should start within one week post op of your fracture surgery  Maintain the same interval or time between taking each dose and first decrease the dose.  Cut the total daily intake of opioids by one tablet each day Next start to increase the time between doses. The last dose that should be eliminated is the evening dose.    STOP SMOKING OR USING NICOTINE PRODUCTS!!!!  As discussed nicotine severely impairs your body's ability to heal surgical and traumatic wounds but also impairs bone healing.  Wounds and bone heal by forming microscopic blood vessels (angiogenesis) and nicotine is a vasoconstrictor (essentially, shrinks blood vessels).  Therefore, if vasoconstriction occurs to these microscopic blood vessels they essentially disappear and are unable to deliver necessary nutrients to the healing tissue.  This is one modifiable factor that you can do to dramatically increase your chances of healing your injury.    (This means no smoking, no nicotine gum, patches, etc)  DO NOT USE NONSTEROIDAL ANTI-INFLAMMATORY DRUGS (NSAID'S)  Using products such as Advil (ibuprofen), Aleve (naproxen), Motrin (ibuprofen) for additional pain control during fracture healing can delay and/or prevent the healing response.  If you would like to take over the counter (OTC) medication, Tylenol (acetaminophen) is ok.  However, some narcotic medications that are given for pain control contain acetaminophen as well. Therefore, you should not exceed more than 4000 mg of tylenol in a day if you do not have liver disease.  Also note that there are may OTC medicines, such as cold medicines and allergy medicines that my contain tylenol as well.  If you have any questions about medications and/or interactions please ask your doctor/PA or your pharmacist.      ICE AND ELEVATE INJURED/OPERATIVE EXTREMITY  Using ice and elevating the injured extremity above your heart can help with swelling and pain control.  Icing in a pulsatile  fashion, such as 20 minutes on and 20 minutes off, can be followed.    Do not place ice directly on skin. Make sure there is a barrier between to skin and the ice pack.    Using frozen items such as frozen peas works well as the conform nicely to the are that needs to be iced.  USE AN ACE WRAP OR TED HOSE FOR SWELLING CONTROL  In addition to icing and elevation, Ace wraps or TED hose are used to help limit and resolve swelling.  It is recommended to use Ace wraps or TED hose until you are informed to stop.    When using Ace Wraps start the wrapping distally (farthest away from the body) and wrap proximally (closer to the body)   Example: If you had surgery on your leg or thing and you do not have a splint on, start the ace wrap at the toes and work your way up to the thigh        If you had surgery on your upper extremity and do not have a splint on, start the ace wrap at your fingers and work your way up to the upper arm  IF YOU ARE IN A SPLINT OR CAST DO NOT [place]   If your splint gets wet for any reason please contact the office immediately. You may shower in your splint or cast as long as you keep it dry.  This can be done by wrapping in a cast cover or garbage back (or similar)  Do Not stick any thing down your splint or cast such as  pencils, money, or hangers to try and scratch yourself with.  If you feel itchy take benadryl as prescribed on the bottle for itching  IF YOU ARE IN A CAM BOOT (BLACK BOOT)  You may remove boot periodically. Perform daily dressing changes as noted below.  Wash the liner of the boot regularly and wear a sock when wearing the boot. It is recommended that you sleep in the boot until told otherwise    Call office for the following: Temperature greater than 101F Persistent nausea and vomiting Severe uncontrolled pain Redness, tenderness, or signs of infection (pain, swelling, redness, odor or green/yellow discharge around the site) Difficulty  breathing, headache or visual disturbances Hives Persistent dizziness or light-headedness Extreme fatigue Any other questions or concerns you may have after discharge  In an emergency, call 911 or go to an Emergency Department at a nearby hospital  HELPFUL INFORMATION  If you had a block, it will wear off between 8-24 hrs postop typically.  This is period when your pain may go from nearly zero to the pain you would have had postop without the block.  This is an abrupt transition but nothing dangerous is happening.  You may take an extra dose of narcotic when this happens.  You should wean off your narcotic medicines as soon as you are able.  Most patients will be off or using minimal narcotics before their first postop appointment.   We suggest you use the pain medication the first night prior to going to bed, in order to ease any pain when the anesthesia wears off. You should avoid taking pain medications on an empty stomach as it will make you nauseous.  Do not drink alcoholic beverages or take illicit drugs when taking pain medications.  In most states it is against the law to drive while you are in a splint or sling.  And certainly against the law to drive while taking narcotics.  You may return to work/school in the next couple of days when you feel up to it.   Pain medication may make you constipated.  Below are a few solutions to try in this order: Decrease the amount of pain medication if you aren't having pain. Drink lots of decaffeinated fluids. Drink prune juice and/or each dried prunes  If the first 3 don't work start with additional solutions Take Colace - an over-the-counter stool softener Take Senokot - an over-the-counter laxative Take Miralax - a stronger over-the-counter laxative     CALL THE OFFICE WITH ANY QUESTIONS OR CONCERNS: [phone]   VISIT OUR WEBSITE FOR ADDITIONAL INFORMATION: [email or web address]

## 2021-07-22 NOTE — Transfer of Care (Signed)
Immediate Anesthesia Transfer of Care Note  Patient: Terri Ayala  Procedure(s) Performed: HARDWARE REMOVAL (Left: Ankle)  Patient Location: PACU  Anesthesia Type:General  Level of Consciousness: awake, alert  and oriented  Airway & Oxygen Therapy: Patient Spontanous Breathing and Patient connected to nasal cannula oxygen  Post-op Assessment: Report given to RN and Post -op Vital signs reviewed and stable  Post vital signs: Reviewed and stable  Last Vitals:  Vitals Value Taken Time  BP 152/93 07/22/21 0937  Temp 36.3 C 07/22/21 0937  Pulse 105 07/22/21 0940  Resp 11 07/22/21 0940  SpO2 100 % 07/22/21 0940  Vitals shown include unvalidated device data.  Last Pain:  Vitals:   07/22/21 0714  TempSrc:   PainSc: 3       Patients Stated Pain Goal: 3 (56/38/93 7342)  Complications: No notable events documented.

## 2021-07-22 NOTE — Anesthesia Postprocedure Evaluation (Signed)
Anesthesia Post Note  Patient: Terri Ayala  Procedure(s) Performed: HARDWARE REMOVAL (Left: Ankle)     Patient location during evaluation: PACU Anesthesia Type: General Level of consciousness: awake Pain management: pain level controlled Vital Signs Assessment: post-procedure vital signs reviewed and stable Respiratory status: spontaneous breathing and respiratory function stable Cardiovascular status: stable Postop Assessment: no apparent nausea or vomiting Anesthetic complications: no   No notable events documented.  Last Vitals:  Vitals:   07/22/21 1007 07/22/21 1028  BP: (!) 152/96 (!) 142/84  Pulse: 97 95  Resp: 12 14  Temp:  36.7 C  SpO2: 96% 92%    Last Pain:  Vitals:   07/22/21 1028  TempSrc:   PainSc: Asleep                 Merlinda Frederick

## 2021-07-22 NOTE — H&P (Signed)
Orthopaedic Trauma Service (OTS) Consult   Patient ID: Terri Ayala MRN: [id number] DOB/AGE: [birth year] 66 y.o.    HPI: Terri Ayala is an 66 y.o. female s/p ORIF L ankle 01/2021. Pt has healed but has had persistent pain over her hardware.  She presents today for removal of hardware L ankle. Of note pt was admitted last week to [facility] for alcohol detox.  Risks and benefits reviewed with patient and she wishes to proceed.   Past Medical History:  Diagnosis Date   Alcohol withdrawal ([place]) 11/2017; 01/04/2018   Alcoholism ([place])    Anxiety    Arthritis    "hands" (01/04/2018)   ASYMPTOMATIC POSTMENOPAUSAL STATUS 08/17/2009   Chronic lower back pain    Depression    DYSPHAGIA 06/19/2007   Fatty liver, alcoholic    GERD (gastroesophageal reflux disease)    GOITER, MULTINODULAR 08/17/2009   Hyperlipidemia    Hypertension    Migraines    "not sure what triggers them; I'll have 1 q couple weeks or more; come 2 days in a row when they come" (01/04/2018)   Mitral valve prolapse    OSTEOPOROSIS 08/17/2009   PONV (postoperative nausea and vomiting)    Supraventricular tachycardia ([place])     Past Surgical History:  Procedure Laterality Date   BREAST BIOPSY Right    "benign"   CATARACT EXTRACTION W/ INTRAOCULAR LENS  IMPLANT, BILATERAL     CERVICAL CONE BIOPSY  2000s   CERVIX LESION DESTRUCTION  1991   "dysplasia; lesions"   CHOLECYSTECTOMY N/A 03/14/2013   Procedure: LAPAROSCOPIC CHOLECYSTECTOMY WITH ATTEMPTED INTRAOPERATIVE CHOLANGIOGRAM;  Surgeon: Harl Bowie, MD;  Location: [facility];  Service: General;  Laterality: N/A;   laporoscopic abdominal surgery     "for endometriosis"   LEFT HEART CATH AND CORONARY ANGIOGRAPHY N/A 05/07/2018   Procedure: LEFT HEART CATH AND CORONARY ANGIOGRAPHY;  Surgeon: Belva Crome, MD;  Location: [facility];  Service: Cardiovascular;  Laterality: N/A;   ORIF ANKLE FRACTURE Left 02/11/2021   Procedure: OPEN REDUCTION  INTERNAL FIXATION (ORIF) ANKLE FRACTURE;  Surgeon: Altamese , MD;  Location: [facility];  Service: Orthopedics;  Laterality: Left;   TONSILLECTOMY      Family History  Problem Relation Age of Onset   Colon cancer Father        Deceased, 66   Osteoporosis Mother        Living, 41   Healthy Brother    Healthy Brother    Healthy Son    Healthy Son    Thyroid disease Neg Hx    Goiter Neg Hx     Social History:  reports that she quit smoking about 47 years ago. Her smoking use included cigarettes. She has a 7.00 pack-year smoking history. She has never used smokeless tobacco. She reports that she does not currently use alcohol after a past usage of about 28.0 standard drinks per week. She reports that she does not use drugs.  Allergies:  Allergies  Allergen Reactions   Doxycycline Swelling    Mouth swelling and sores   Codeine Itching   Tetracycline Hcl Swelling    Mouth swelling and sores    Medications: I have reviewed the patient's current medications. Current Meds  Medication Sig   albuterol (VENTOLIN HFA) 108 (90 Base) MCG/ACT inhaler INHALE 2 PUFFS INTO THE LUNGS EVERY 6 HOURS AS NEEDED FOR WHEEZING OR SHORTNESS OF BREATH.   alendronate (FOSAMAX) 70 MG tablet  Take 70 mg by mouth every Monday.   baclofen (LIORESAL) 10 MG tablet Take 1 tablet (10 mg total) by mouth 3 (three) times daily.   busPIRone (BUSPAR) 15 MG tablet Take 15 mg by mouth 3 (three) times daily.   Calcium Carbonate-Vitamin D (CALCIUM 600+D PO) Take 1 tablet by mouth daily.   carvedilol (COREG) 3.125 MG tablet TAKE 1 TABLET BY MOUTH 2 TIMES A DAY FOR HYPERTENSION   diazepam (VALIUM) 10 MG tablet Take according to withdrawal protocol (pt has copy)   DULoxetine (CYMBALTA) 60 MG capsule TAKE 1 CAPSULE (60 MG TOTAL) BY MOUTH DAILY. (Patient taking differently: Take 60 mg by mouth 2 (two) times daily.)   fluticasone (FLONASE) 50 MCG/ACT nasal spray Place 2 sprays into both nostrils daily as needed for allergies.    folic acid (FOLVITE) 1 MG tablet Take 1 mg by mouth daily.   gabapentin (NEURONTIN) 100 MG capsule Take 2 capsules (200 mg total) by mouth 3 (three) times daily.   hydrOXYzine (ATARAX/VISTARIL) 25 MG tablet Take 25 mg by mouth 3 (three) times daily.   meloxicam (MOBIC) 15 MG tablet Take 15 mg by mouth daily as needed for pain.   Multiple Vitamins-Minerals (ADULT GUMMY PO) Take 2 capsules by mouth daily.   norethindrone-ethinyl estradiol (FEMHRT 1/5) 1-5 MG-MCG TABS tablet Take 1 tablet by mouth daily.   pantoprazole (PROTONIX) 40 MG tablet TAKE 1 TABLET BY MOUTH 2 TIMES DAILY.   SUMAtriptan (IMITREX) 50 MG tablet TAKE 1 TABLET BY MOUTH AS NEEDED FOR MIGRAINE HEADACHE   thiamine 100 MG tablet Take 1 tablet (100 mg total) by mouth daily.   traZODone (DESYREL) 50 MG tablet Take 50 mg by mouth at bedtime.     No results found for this or any previous visit (from the past 48 hour(s)).  No results found.  Intake/Output    None      Review of Systems  Constitutional:  Negative for chills and fever.  Respiratory:  Negative for cough.   Cardiovascular:  Negative for chest pain.  Gastrointestinal:  Negative for abdominal pain.  Musculoskeletal:        L ankle pain over hardware   Blood pressure (!) 164/95, pulse 79, temperature 97.9 F (36.6 C), temperature source Oral, resp. rate 18, height '5\' 2"'$  (1.575 m), weight 63.5 kg, SpO2 100 %. Physical Exam Constitutional:      General: She is not in acute distress. Eyes:     Extraocular Movements: Extraocular movements intact.  Cardiovascular:     Rate and Rhythm: Normal rate.  Pulmonary:     Effort: Pulmonary effort is normal.  Musculoskeletal:     Comments: Left Lower Extremity  Surgical wounds left ankle well healed Excellent ROM L ankle TTP over hardware L ankle  Motor and sensory functions intact No DCT No asymmetric swelling  + DP pulse  Ext is warm   Neurological:     General: No focal deficit present.     Mental Status: She  is alert and oriented to person, place, and time.  Psychiatric:        Mood and Affect: Mood normal.      Assessment/Plan:  66 y/o female s/p ORIF L ankle with symptomatic hardware  -symptomatic hardware left ankle   OR for ROH L ankle  Outpt procedure  WBAT post op     Pt is at elevated risk for wound complications given EtOH abuse  - Pain management:  Short course of norco post op (<5  days)  - Dispo:  OR for [facility] L ankle     Jari Pigg, PA-C [phone] Loletha Grayer) 07/22/2021, 7:22 AM  [facility] [place] [address] [phone] Jenetta [phone] (F)    After 5pm and on the weekends please log on to Amion, go to orthopaedics and the look under the [facility] Call for the provider(s) on call. You can also call our office at [phone] and then follow the prompts to be connected to the call team.

## 2021-07-22 NOTE — Anesthesia Procedure Notes (Signed)
Procedure Name: LMA Insertion Date/Time: 07/22/2021 8:23 AM Performed by: Mariea Clonts, CRNA Pre-anesthesia Checklist: Patient identified, Emergency Drugs available, Suction available and Patient being monitored Patient Re-evaluated:Patient Re-evaluated prior to induction Oxygen Delivery Method: Circle System Utilized Preoxygenation: Pre-oxygenation with 100% oxygen Induction Type: IV induction Ventilation: Mask ventilation without difficulty LMA: LMA inserted LMA Size: 4.0 Number of attempts: 1 Airway Equipment and Method: Bite block Placement Confirmation: positive ETCO2 Tube secured with: Tape Dental Injury: Teeth and Oropharynx as per pre-operative assessment

## 2021-07-23 ENCOUNTER — Encounter (HOSPITAL_COMMUNITY): Payer: Self-pay | Admitting: Orthopedic Surgery

## 2021-07-28 ENCOUNTER — Telehealth (HOSPITAL_COMMUNITY): Payer: Self-pay | Admitting: Licensed Clinical Social Worker

## 2021-07-28 ENCOUNTER — Telehealth (HOSPITAL_COMMUNITY): Payer: Self-pay | Admitting: Psychiatry

## 2021-07-28 ENCOUNTER — Other Ambulatory Visit (HOSPITAL_COMMUNITY): Payer: Self-pay | Admitting: Medical

## 2021-07-28 NOTE — Telephone Encounter (Addendum)
D:  Patient called and left vm wanting Darlyne Russian, PA to call her back.  States she has seen him a few times and has questions for him.  A:  Terri Ayala a text requesting that he call pt back.  Informed Bill Garrot, LCSW, LCAS of phone call also.  See Contact Pt worried about possible recurring ETOH withdrawal symptoms. Currently on hydrocodenr post ankle surgery and asymptomatic She has 3 Valium left from detox protocol.Wonders if she might take if she does have terrible symptoms. ADVISED NOT TO TAKE ANY VALIUM WHILE TAKING HYDROCODONE.  She has been alcohol free since Detox. Dont expect she will experience any more severe withdrawals.Advised she could take remaining Valium for severe withdrawal (Not likely) but prefer she call and talk first and UNDER NO CIRCUMSTANCE WHILE TAKING HYDROCODONE.

## 2021-07-28 NOTE — Telephone Encounter (Signed)
The therapist returns Shakita's message leaving a HIPAA-compliant voicemail and wishing her a speedy recovery encouraging her to call this therapist when she is ready to start group.   Also, the therapist informs her that her message for "Juanda Crumble" was relayed and that hopefully he can get back with her later this afternoon as he is out-of-the-office until about 3:30 p.m. EST.  Adam Phenix, [place], LCSW, [facility], [place] 07/28/2021

## 2021-07-30 ENCOUNTER — Encounter: Payer: Self-pay | Admitting: Physician Assistant

## 2021-07-30 ENCOUNTER — Ambulatory Visit (INDEPENDENT_AMBULATORY_CARE_PROVIDER_SITE_OTHER): Payer: Medicare Other | Admitting: Physician Assistant

## 2021-07-30 VITALS — BP 129/78 | HR 71 | Temp 97.8°F | Ht 62.0 in | Wt 144.0 lb

## 2021-07-30 DIAGNOSIS — F331 Major depressive disorder, recurrent, moderate: Secondary | ICD-10-CM | POA: Diagnosis not present

## 2021-07-30 DIAGNOSIS — Z09 Encounter for follow-up examination after completed treatment for conditions other than malignant neoplasm: Secondary | ICD-10-CM

## 2021-07-30 DIAGNOSIS — F10930 Alcohol use, unspecified with withdrawal, uncomplicated: Secondary | ICD-10-CM

## 2021-07-30 DIAGNOSIS — F1020 Alcohol dependence, uncomplicated: Secondary | ICD-10-CM | POA: Diagnosis not present

## 2021-07-30 DIAGNOSIS — I10 Essential (primary) hypertension: Secondary | ICD-10-CM

## 2021-07-30 DIAGNOSIS — F411 Generalized anxiety disorder: Secondary | ICD-10-CM

## 2021-07-30 NOTE — Progress Notes (Signed)
Established patient visit   Patient: Terri Ayala   DOB: 02-11-1956   66 y.o. Female  MRN: [id number] Visit Date: 07/30/2021  No chief complaint on file.  Subjective    HPI  Patient presents for hospital follow-up. Patient was admitted 07/13/2021 for alcohol detoxification and discharged 07/17/2021. Patient reports has not had a alcohol drink since then. Terri Ayala has some alcohol cravings and takes baclofen. States Gabapentin was increased to 200 mg TID to help with her alcohol use and nerve pain. No confusion. Reports not as sweaty as before and intermittently has mild hand shaking. Patient is followed by Terri Ayala and has a virtual visit next week. Is planning to start IOP once she sees her surgeon next Wednesday and balance is better. Patient is s/p hardware removal for L ankle ORIF she had December 2022. Has been using cane and walker as needed for assistance. States wants to have PT to help improve her strength and balances, plans to discuss with orthopedist.   Discharge summary: -[facility] - Discharge Summary  Date of Admission: 07/13/2021 Date of Discharge: 07/17/2021 Attending Provider: Delsa Ayala [facility] LOS: 4 days  Time Spent performing discharge services: - 40 minutes  Discharge Diagnoses and Medications   Principal Problem: Uncomplicated alcohol dependence ([place]) Active Problems: Generalized anxiety disorder MDD (major depressive disorder), recurrent severe, without psychosis ([place]) Elevated liver enzymes Benign essential HTN  Discharge Medications:    Medication List   CONTINUE taking these medications  albuterol 90 mcg/actuation inhaler Commonly known as: PROAIR DIGIHALER  baclofen 10 MG tablet Commonly known as: LIORESAL  busPIRone 15 MG tablet Commonly known as: BUSPAR  CALCIUM 600 + D(3) ORAL  carvediloL 3.125 MG tablet Commonly known as: COREG  DULoxetine 60 MG DR capsule Commonly known as: CYMBALTA  gabapentin 100 MG capsule Commonly  known as: NEURONTIN  hydrOXYzine HCL 25 MG tablet Commonly known as: ATARAX  ibuprofen 200 MG tablet Commonly known as: ADVIL,MOTRIN  meloxicam 15 MG tablet Commonly known as: MOBIC  multivitamin capsule  SUMAtriptan 50 MG tablet Commonly known as: IMITREX  traZODone 50 MG tablet Commonly known as: DESYREL  VITAMIN D3 ORAL   STOP taking these medications  alendronate 70 MG tablet Commonly known as: FOSAMAX  calcium carbonate 200 mg calcium (500 mg) chewable tablet Commonly known as: TUMS  chlordiazePOXIDE 25 MG capsule Commonly known as: LIBRIUM  diazePAM 10 MG tablet Commonly known as: VALIUM  Fyavolv 1-5 mg-mcg Tab Generic drug: norethindrone-ethinyl estradiol  naltrexone 50 mg tablet Commonly known as: ReVia  norethindrone-ethinyl estradiol 1-5 mg-mcg Tab Commonly known as: FEMHRT 1/5  propranoloL 20 MG tablet Commonly known as: INDERAL    Risks, benefits, and side effects were discussed in detail prior to discharge. Hospital Course   Consulting Services: none Indication for Admission: Alcohol use disorder severe and admitted for alcohol detox  Terri Ayala is a 66 y.o. female with a historyalcohol dependence, MDD, HTN, GAD, GERD, osteoporosis, who presents voluntarily to [facility] ED requesting detox from alcohol prior to having an ankle revision surgery. Her orthopedic physician has recommended detox given her frequent falls. She fell and sustained ankle fracture 2-40mhs ago and ambulation has improved with PT/OT. She is provided with walker during inpatient stay due to intoxication and instability to prevent falls but she does not use walker at home. She reports to longstanding history of alcohol use and drinks 1-2 bottles of wine daily. Longest sober was 235yr She [facility]. She has been to [facility]. She is endorsing tremors, nausea, bodyaches, anxiety and poor sleep. She  is curled up and bed reporting she feels bad and is nauseous. She ate her breakfast and denies any vomiting, diarrhea. She has history of depression and has been doing well on Cymbalta. No overt symptoms of depression, mania or psychosis indicated with this admission. Denies any SI, HI or AVH.  On admission started on alcohol detox with Ativan tapering and CIWA protocol for breakthrough symptoms. She was intoxicated on her arrival with BAL 393, GGT 247 with elevated liver enzymes. She was monitored her withdrawals with CIWA protocol. Ordered gabapentin 200 mg 3 times daily for withdrawals. Home medications restarted such as cymbalta, buspar and HTN meds were resumed. Also ordered as needed medication to control anxiety and agitation.  During the course of the hospitalization patient completed alcohol detox and denies any withdrawals today. She has been medication compliant and tolerating. States that improving her sleep and appetite. She denies any nausea or vomiting. She attended groups and participated. Noticed improvement in her mood, affect and insight. She interacted well with the treatment team and peers. No behavioral issues in the unit. Provided motivation and support. Educated the risk of chronic alcoholism and the complications discussed with.. Provided motivational interviewing techniques to sober from addiction discussed. She is going to attend IOP program starting at 07/21/2021, and med management at [facility] with the psychiatrist. She denies suicidal or homicidal ideation. She denies hallucination, delusions or psychosis. Discharge planning discussed and stable for discharge today  On 07/17/2021, following sustained improvement in the affect of this patient, continued report of euthymic mood, repeated denial of suicidal, homicidal, and other violent ideation, adequate interaction with peers, active participation in groups while on the unit, and denial of adverse reactions from medications, the  treatment team decided Terri Ayala was stable for discharge home with scheduled mental health treatment as noted below.  Medication changes during this hospitalization: None  Justification for two or more antipsychotic medications: Is not being discharged on multiple antipsychotic meds  Tobacco/Substance Use Recommendation   Tobacco use 30 days prior to admission? No  Referral to outpatient treatment for Substance/Tobacco use disorder was not indicated. When applicable, scheduled referrals are listed below.  FDA-approved cessation medication prescription offered/prescribed: N/A  Condition Upon Discharge   Vitals:  Temp: 97.8 F (36.6 C) Pulse: 110 (reported to the nurse) Resp: 18 BP: 132/62 SpO2: 100 %  Constitutional:  General Appearance Casually dressed, normal weight, normal appearance and Well dressed, well-groomed, normal appearance  General Behavioral Pleasant and cooperative  Musculoskeletal:  Gait and Station No gait abnormalities  Strength and Tone Normal  Psychiatric:  Psychomotor Activity No motor abnormalities  Speech Normal rate/volume/tone  Mood Appropriate to circumstances  Affect Full range/appropriate and reactive  Thought Process Linear, logical, and goal directed  Associations Intact association  Thought content/Perceptual Disturbance Patient denies suicidal/homicidal ideation and No evidence of perceptual hallucinations or delusions  Cognition/Sensorium AAOx4; Memory, attention, language, and fund of knowledge intact  Insight Fair  Judgement Good   Discharge Instructions and Disposition   Discharge Orders and Instructions  Diet At Discharge Complete by: As directed  Recommended diet at discharge: Cardiac diet  Activity At Discharge Complete by: As directed  Recommended activity at discharge: Activity as tolerated     Discharge Follow-Up Plan:   Clinical Follow-up ([facility] and/or SA Services):  Name of Provider Agency Referred: [facility] Date/Time of Appointment:  Patient has an appointment for assessment scheduled for 522/23 '@1pm'$ , Patient will start IOP on 07/21/21 Phone: [phone] Address: [place] [address] [place]  Reason: SAIOP   Transportation: Care Provider   **Please bring photo ID, social security card, insurance card, or proof of household income if they do not have insurance.**   Crisis Support Line: [phone]    Discharge Follow-Up Plan:   Clinical Follow-up [facility] and/or SA Services):  Name of Provider Agency Referred: [facility] [place] [facility] Date/Time of Appointment:Patient has an appointment by telephone on 07/21/21 @ 11:00 am ACCEPT WALK INS MON-THUR [place] 8-1PM Phone Number: [phone] Address: [address] [address]  Reason: MEDICATION MANAGEMENT AND/OR THERAPY    Transportation: Care Provider   **Please bring photo ID, social security card, insurance card, or proof of household income if they do not have insurance.**   Crisis Support Line: [phone]   The patient was referred to the providers listed above at the appointment time listed above for the treatment of behavioral health and substance use disorder.  Disposition: Discharge to home  Recommendations to providers: none    Medications: Outpatient Medications Prior to Visit  Medication Sig   acetaminophen (TYLENOL) 500 MG tablet Take 500 mg by mouth every 8 (eight) hours as needed.   albuterol (VENTOLIN HFA) 108 (90 Base) MCG/ACT inhaler INHALE 2 PUFFS INTO THE LUNGS EVERY 6 HOURS AS NEEDED FOR WHEEZING OR SHORTNESS OF BREATH.   alendronate (FOSAMAX) 70 MG tablet Take 70 mg by mouth every Monday.   baclofen (LIORESAL) 10 MG tablet Take 1 tablet (10 mg total) by mouth 3 (three) times daily.   busPIRone (BUSPAR) 15 MG tablet Take 15 mg by mouth 3 (three) times daily.   Calcium Carbonate-Vitamin D (CALCIUM 600+D PO) Take 1 tablet by mouth daily.   carvedilol (COREG)  3.125 MG tablet TAKE 1 TABLET BY MOUTH 2 TIMES A DAY FOR HYPERTENSION   diazepam (VALIUM) 10 MG tablet Take according to withdrawal protocol (pt has copy)   DULoxetine (CYMBALTA) 60 MG capsule TAKE 1 CAPSULE (60 MG TOTAL) BY MOUTH DAILY. (Patient taking differently: Take 60 mg by mouth 2 (two) times daily.)   fluticasone (FLONASE) 50 MCG/ACT nasal spray Place 2 sprays into both nostrils daily as needed for allergies.   folic acid (FOLVITE) 1 MG tablet Take 1 mg by mouth daily.   gabapentin (NEURONTIN) 100 MG capsule Take 2 capsules (200 mg total) by mouth 3 (three) times daily.   HYDROcodone-acetaminophen (NORCO) 5-325 MG tablet Take 1-2 tablets by mouth every 12 (twelve) hours as needed for moderate pain or severe pain.   hydrOXYzine (ATARAX/VISTARIL) 25 MG tablet Take 25 mg by mouth 3 (three) times daily.   meloxicam (MOBIC) 15 MG tablet Take 15 mg by mouth daily as needed for pain.   Multiple Vitamins-Minerals (ADULT GUMMY PO) Take 2 capsules by mouth daily.   norethindrone-ethinyl estradiol (FEMHRT 1/5) 1-5 MG-MCG TABS tablet Take 1 tablet by mouth daily.   ondansetron (ZOFRAN) 4 MG tablet Take 4 mg by mouth every 8 (eight) hours as needed.   pantoprazole (PROTONIX) 40 MG tablet TAKE 1 TABLET BY MOUTH 2 TIMES DAILY.   SUMAtriptan (IMITREX) 50 MG tablet TAKE 1 TABLET BY MOUTH AS NEEDED FOR MIGRAINE HEADACHE   thiamine 100 MG tablet Take 1 tablet (100 mg total) by mouth daily.   traZODone (DESYREL) 50 MG tablet Take 50 mg by mouth at bedtime.   No facility-administered medications prior to  visit.    Review of Systems Review of Systems:  A fourteen system review of systems was performed and found to be positive as per HPI.  Last CBC Lab Results  Component Value Date   WBC 7.0 06/09/2021   HGB 12.7 06/09/2021   HCT 38.1 06/09/2021   MCV 94.1 06/09/2021   MCH 31.4 06/09/2021   RDW 15.7 (H) 06/09/2021   PLT 164 64/40/3474   Last metabolic panel Lab Results  Component Value Date    GLUCOSE 96 06/09/2021   NA 139 06/09/2021   K 4.0 06/09/2021   CL 105 06/09/2021   CO2 24 06/09/2021   BUN 17 06/09/2021   CREATININE 0.75 06/09/2021   GFRNONAA >60 06/09/2021   CALCIUM 8.5 (L) 06/09/2021   PHOS 4.7 (H) 02/16/2021   PROT 6.7 06/09/2021   ALBUMIN 3.9 06/09/2021   LABGLOB 2.0 12/07/2020   AGRATIO 2.3 (H) 12/07/2020   BILITOT 1.4 (H) 06/09/2021   ALKPHOS 64 06/09/2021   AST 34 06/09/2021   ALT 15 06/09/2021   ANIONGAP 10 06/09/2021   Last lipids Lab Results  Component Value Date   CHOL 199 10/19/2020   HDL 48 10/19/2020   LDLCALC 139 (H) 10/19/2020   TRIG 68 10/19/2020   CHOLHDL 4.1 10/19/2020   Last hemoglobin A1c Lab Results  Component Value Date   HGBA1C 4.7 (L) 11/21/2020   Last thyroid functions Lab Results  Component Value Date   TSH 3.332 06/08/2021   T3TOTAL 112.8 10/04/2010   T4TOTAL 8.0 10/19/2011   Last vitamin D Lab Results  Component Value Date   VD25OH 42.20 02/12/2021      07/30/2021   10:48 AM 07/19/2021    2:11 PM 05/17/2021    3:04 PM 05/05/2021    2:31 PM 12/07/2020    2:11 PM  Depression screen PHQ 2/9  Decreased Interest 0 0 0 1 1  Down, Depressed, Hopeless 0 0 0 1 0  PHQ - 2 Score 0 0 0 2 1  Altered sleeping 1  0 1 1  Tired, decreased energy '1  1 1 1  '$ Change in appetite 0  0 1 0  Feeling bad or failure about yourself  0  0 1 0  Trouble concentrating 1  0 1 0  Moving slowly or fidgety/restless 0  0 1 0  Suicidal thoughts 0  0 0 0  PHQ-9 Score '3  1 8 3  '$ Difficult doing work/chores   Not difficult at all  Somewhat difficult      07/30/2021   10:49 AM 05/05/2021    2:31 PM 12/07/2020    2:11 PM 10/19/2020    9:15 AM  GAD 7 : Generalized Anxiety Score  Nervous, Anxious, on Edge '1 1 1 '$ 0  Control/stop worrying 1 1 0 0  Worry too much - different things 1 1 0 0  Trouble relaxing 0 1 1 0  Restless 0 1 0 0  Easily annoyed or irritable 0 1 0 0  Afraid - awful might happen 0 0 0 0  Total GAD 7 Score '3 6 2 '$ 0  Anxiety  Difficulty   Somewhat difficult         Objective    BP 129/78 (BP Location: Left Arm, Patient Position: Sitting, Cuff Size: Normal)   Pulse 71   Temp 97.8 F (36.6 C) (Oral)   Ht '5\' 2"'$  (1.575 m)   Wt 144 lb (65.3 kg)   BMI 26.34 kg/m  BP Readings from  Last 3 Encounters:  07/30/21 129/78  07/22/21 (!) 142/84  07/07/21 130/84   Wt Readings from Last 3 Encounters:  07/30/21 144 lb (65.3 kg)  07/22/21 140 lb (63.5 kg)  07/07/21 144 lb (65.3 kg)    Physical Exam  General:  Cooperative, in no acute distress, answering questions appropriately  Neuro:  Alert and oriented,  extra-ocular muscles intact  HEENT:  Normocephalic, atraumatic, neck supple  Skin:  no gross rash, warm, pink. Cardiac:  RRR, S1 S2 Respiratory: CTA B/L  Vascular:  Ext warm, no cyanosis apprec.; cap RF less 2 sec. Psych:  No HI/SI, judgement and insight good, Euthymic mood. Full Affect.   No results found for any visits on 07/30/21.  Assessment & Plan      Problem List Items Addressed This Visit       Cardiovascular and Mediastinum   Essential hypertension     Other   Alcohol use disorder, severe, dependence ([place])   Major depressive disorder, recurrent episode, moderate ([place])   Alcohol withdrawal [facility])   Hospital discharge follow-up - Primary   Generalized anxiety disorder   Hospital discharge follow-up: -Reviewed hospital notes. -Praised patient for alcohol cessation. Highly recommend to contact IOP facility once cleared by her surgeon (s/p hardware removal 07/22/21). Reviewed noted in [place] from [facility]. -Recommend to continue with Gabapentin 200 mg three times daily and baclofen 10 mg TID as needed. Advised continue with ondansetron as needed for nausea. -Follow-up with [facility] as scheduled.  Major depressive disorder, recurrent episode, moderate: -Stable. -Followed by Terri Ayala. -Recommend to continue with Duloxetine 60 mg BID and Buspar 15 mg TID.  GAD: -Stable. -Followed by  Terri Ayala. -Recommend to continue Duloxetine 60 mg BID, Hydroxyzine 25 mg TID as needed.  Essential hypertension: -BP today stable. -Continue carvedilol 3.125 mg BID. -Will continue to monitor.   Return in about 3 months (around 10/30/2021) for [facility] (reschedule from today), visit became hospital f/up.        Lorrene Reid, PA-C  [facility] at [facility] [phone] (phone) [phone] (fax)  [place]

## 2021-07-30 NOTE — Patient Instructions (Signed)
Alcohol Abuse and Nutrition Alcohol abuse is any pattern of alcohol consumption that harms your health, relationships, or work. Alcohol abuse can cause poor nutrition (malnutrition or malnourishment) and a lack of nutrients (nutrient deficiencies), which can lead to more health problems. Alcohol abuse brings malnutrition and nutrient deficiencies in two ways: It causes your liver to work abnormally. This affects how your body divides (breaks down) and absorbs nutrients from food. It causes you to eat poorly. Many people who abuse alcohol do not eat enough carbohydrates, protein, fat, vitamins, and minerals. Nutrients that are commonly lacking (deficient) in people who abuse alcohol include: Vitamins. Vitamin A. This is needed for your vision, metabolism, and ability to fight off infections (immunity). B vitamins. These include folate, thiamine, and niacin. These are needed for new cell growth. Vitamin C. This plays an important role in wound healing, immunity, and helping your body to absorb iron. Vitamin D. This is necessary for your body to absorb and use calcium. It is produced by your liver, but you can also get it from food and from sun exposure. Minerals. Calcium. This is needed for healthy bones as well as heart and blood vessel (cardiovascular) function. Iron. This is important for blood, muscle, and nervous system functioning. Magnesium. This plays an important role in muscle and nerve function, and it helps to control blood sugar and blood pressure. Zinc. This is important for the normal functioning of your nervous system and digestive system (gastrointestinal tract). If you think that you have an alcohol dependency problem, or if it is hard to stop drinking because you feel sick or different when you do not use alcohol, talk with your health care provider or another health professional about where to get help. Nutrition is an essential factor in the therapy for alcohol abuse. Your health  care provider or diet and nutrition specialist (dietitian) will work with you to design a plan that can help to restore nutrients to your body and prevent the risk of complications. What is my plan? Your dietitian may develop a specific eating plan that is based on your condition and any other problems that you have. An eating plan will commonly include: A balanced diet. Grains: 6-8 oz (170-227 g) a day. Examples of 1 oz of whole grains include 1 cup of whole-wheat cereal,  cup of brown rice, or 1 slice of whole-wheat bread. Vegetables: 2-3 cups a day. Examples of 1 cup of vegetables include 2 medium carrots, 1 large tomato, or 2 stalks of celery. Fruits: 1-2 cups a day. Examples of 1 cup of fruit include 1 large banana, 1 small apple, 8 large strawberries, or 1 large orange. Meat and other protein: 5-6 oz (142-170 g) a day. A cut of meat or fish that is the size of a deck of cards is about 3-4 oz. Foods that provide 1 oz of protein include 1 egg,  cup of nuts or seeds, or 1 tablespoon (16 g) of peanut butter. Dairy: 2-3 cups a day. Examples of 1 cup of dairy include 8 oz (230 mL) of milk, 8 oz (230 g) of yogurt, or 1 oz (44 g) of natural cheese. Vitamin and mineral supplements. What are tips for following this plan? Eat frequent meals and snacks. Try to eat 5-6 small meals each day. Take vitamin or mineral supplements as recommended by your dietitian. If you are malnourished or if your dietitian recommends it: You may follow a high-protein, high-calorie diet. This may include: 2,000-3,000 calories (kilocalories) a day. 70-100  g (grams) of protein a day. You may be directed to follow a diet that includes a complete nutritional supplement beverage. This can help to restore calories, protein, and vitamins to your body. Depending on your condition, you may be advised to consume this beverage instead of your meals or in addition to them. Certain medicines may cause changes in your appetite, taste,  and weight. Work with your health care provider and dietitian to make any changes to your medicines and eating plan. If you are unable to take in enough food and calories by mouth, your health care provider may recommend a feeding tube. This tube delivers nutritional supplements directly to your stomach. Recommended foods Eat foods that are high in molecules that prevent oxygen from reacting with your food (antioxidants). These foods include grapes, berries, nuts, green tea, and dark green or orange vegetables. Eating these can help to prevent some of the stress that is placed on your liver by consuming alcohol. Eat a variety of fresh fruits and vegetables each day. This will help you to get fiber and vitamins in your diet. Drink plenty of water and other clear fluids, such as apple juice and broth. Try to drink at least 48-64 oz (1.5-2 L) of water a day. Include foods fortified with vitamins and minerals in your diet. Commonly fortified foods include milk, orange juice, cereal, and bread. Eat a variety of foods that are high in omega-3 and omega-6 fatty acids. These include fish, nuts and seeds, and soybeans. These foods may help your liver to recover and may also stabilize your mood. If you are a vegetarian: Eat a variety of protein-rich foods. Pair whole grains with plant-based proteins at meals and snack time. For example, eat rice with beans, put peanut butter on whole-grain toast, or eat oatmeal with sunflower seeds. The items listed above may not be a complete list of foods and beverages you can eat. Contact a dietitian for more information. Foods to avoid Avoid foods and drinks that are high in fat and sugar. Sugary drinks, salty snacks, and candy contain empty calories. This means that they lack important nutrients such as protein, fiber, and vitamins. Avoid alcohol. This is the best way to avoid malnutrition due to alcohol abuse. If you must drink, drink measured amounts. Measured drinking  means limiting your intake to no more than 1 drink a day for nonpregnant women and 2 drinks a day for men. One drink equals 12 oz (355 mL) of beer, 5 oz (148 mL) of wine, or 1 oz (44 mL) of hard liquor. Limit your intake of caffeine. Replace drinks like coffee and black tea with decaffeinated coffee and decaffeinated herbal tea. The items listed above may not be a complete list of foods and beverages you should avoid. Contact a dietitian for more information. Summary Alcohol abuse can cause poor nutrition (malnutrition or malnourishment) and a lack of nutrients (nutrient deficiencies), which can lead to more health problems. Common nutrient deficiencies include vitamin deficiencies (A, B, C, and D) and mineral deficiencies (calcium, iron, magnesium, and zinc). Nutrition is an essential factor in the therapy for alcohol abuse. Your health care provider and dietitian can help you to develop a specific eating plan that includes a balanced diet plus vitamin and mineral supplements. This information is not intended to replace advice given to you by your health care provider. Make sure you discuss any questions you have with your health care provider. Document Revised: 01/08/2021 Document Reviewed: 01/06/2020 Elsevier Patient Education  2023  [employer].

## 2021-08-03 ENCOUNTER — Telehealth: Payer: Self-pay | Admitting: Physician Assistant

## 2021-08-03 DIAGNOSIS — F1020 Alcohol dependence, uncomplicated: Secondary | ICD-10-CM

## 2021-08-03 DIAGNOSIS — G8929 Other chronic pain: Secondary | ICD-10-CM

## 2021-08-03 MED ORDER — FOLIC ACID 1 MG PO TABS: 1.0000 mg | ORAL_TABLET | Freq: Every day | ORAL | 1 refills | Status: DC

## 2021-08-03 MED ORDER — GABAPENTIN 100 MG PO CAPS: 200.0000 mg | ORAL_CAPSULE | Freq: Three times a day (TID) | ORAL | 1 refills | Status: DC

## 2021-08-03 MED ORDER — MELOXICAM 15 MG PO TABS: 15.0000 mg | ORAL_TABLET | Freq: Every day | ORAL | 1 refills | Status: DC | PRN

## 2021-08-03 MED ORDER — BACLOFEN 10 MG PO TABS: 10.0000 mg | ORAL_TABLET | Freq: Three times a day (TID) | ORAL | 1 refills | Status: DC

## 2021-08-03 NOTE — Telephone Encounter (Signed)
Patient called and stated the hospital had given her medication and she was instructed at her last appointment to call here when needing a refill and she is now out of the Folic Acid '1mg'$  and Gabapentin 200 mg tid, Baclofen 10 mg tid and Meloxicam 15 mg prn for pain. Please advise.

## 2021-08-09 ENCOUNTER — Other Ambulatory Visit: Payer: Self-pay | Admitting: Physician Assistant

## 2021-08-10 ENCOUNTER — Telehealth (HOSPITAL_COMMUNITY): Payer: Self-pay | Admitting: Licensed Clinical Social Worker

## 2021-08-10 NOTE — Telephone Encounter (Signed)
Therapist returns Terri Ayala's call confirming her identity via two identifiers. She says that she wants to attend [facility] IOP as of Monday, 08/10/21.   She is entirely off of the Oxycodone and says that she can think now and can drive now so is "really excited" about "getting back to normal."   She is on the Baclofen and still getting some cravings but is not totally focused on it all the time.   Dhalia will start [facility] IOP on Monday, 08/16/21.   [address] [place], LCSW, [facility], LCAS 08/10/2021

## 2021-08-11 ENCOUNTER — Other Ambulatory Visit: Payer: Self-pay | Admitting: Physician Assistant

## 2021-08-11 DIAGNOSIS — I10 Essential (primary) hypertension: Secondary | ICD-10-CM

## 2021-08-16 ENCOUNTER — Encounter (HOSPITAL_COMMUNITY): Payer: Self-pay

## 2021-08-16 ENCOUNTER — Encounter (HOSPITAL_COMMUNITY): Payer: Medicare Other

## 2021-08-16 ENCOUNTER — Telehealth (HOSPITAL_COMMUNITY): Payer: Self-pay | Admitting: Licensed Clinical Social Worker

## 2021-08-16 LAB — HM MAMMOGRAPHY: HM Mammogram: NORMAL (ref 0–4)

## 2021-08-16 NOTE — Telephone Encounter (Signed)
Terri Ayala left a voicemail for this therapist this morning saying that her alarm did not go off so wonders if she should come in or just plan to start on Wednesday, 08/18/21.   The therapist returns her call leaving a HIPAA-compliant voicemail informing her that she can start on 08/18/21 and to call this therapist if she has questions or concerns before then.  Adam Phenix, [place], LCSW, [facility], [place] 08/16/2021

## 2021-08-18 ENCOUNTER — Other Ambulatory Visit (HOSPITAL_COMMUNITY): Payer: Medicare Other | Attending: Psychiatry | Admitting: Licensed Clinical Social Worker

## 2021-08-18 DIAGNOSIS — F419 Anxiety disorder, unspecified: Secondary | ICD-10-CM | POA: Diagnosis not present

## 2021-08-18 DIAGNOSIS — F1020 Alcohol dependence, uncomplicated: Secondary | ICD-10-CM

## 2021-08-18 DIAGNOSIS — Z79899 Other long term (current) drug therapy: Secondary | ICD-10-CM | POA: Diagnosis not present

## 2021-08-18 DIAGNOSIS — F32A Depression, unspecified: Secondary | ICD-10-CM | POA: Insufficient documentation

## 2021-08-18 DIAGNOSIS — F1091 Alcohol use, unspecified, in remission: Secondary | ICD-10-CM | POA: Diagnosis not present

## 2021-08-18 NOTE — Progress Notes (Signed)
Daily Group Progress Note  Program: [facility]   Group Time: 9 a.m. to 12 p.m.   Type of Therapy: Process and Psychoeducational   Topic: The therapist checks in with group members, assesses for SI/HI/psychosis and overall level of functioning. The therapist inquires about sobriety date and number of community support meetings attended since last session. The therapist introduces two new group members having members share their stories of how they came to be in [facility]. The therapist focuses on the importance of having a non-using support system in order to be successful and the reason that AA, NA, or some other non-using fellowship is encouraged. The therapist also educates group members on the potential hazards of various products such as mouthwash with alcohol, "non-alcoholic" beer, etc. on a person's sobriety. The therapist reinforces the need to avoid triggers and create barriers to using in one's life and the need to remain active with activities that gives one's life purpose. The therapist notes that everyone in group has family members who also have issues with addiction discussing the inheritability of the disease.   Summary: Terri Ayala presents for her initial group rating her depression as a "4" and anxiety as a "5" with no SI or HI. She talks about having attended AA in her mid to late 30's and how the last 10 years that she has been in and out of [facility] for detox. She went to [facility] in 2016 the same year that she retired. She says that she has not gotten back to AA yet last having attended in the summer of 2022. She says that she has some people in her life supporting her recovery. 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 She says that she told herself that she was perhaps not an alcoholic and could drink socially advising everyone in group to not do this. She admits  to being anxious about attending group on Monday and overslept. She says that she needs to work on improving her "sleeping habits." She has difficulty hearing today as she rushed to group forgetting her hearing aid.    Progress Towards Goals: Terri Ayala reports no alcohol use.   UDS collected: Yes  Results: No  AA/NA attended?: No  Sponsor?: No   Adam Phenix, [place], LCSW, [facility], LCAS 08/18/2021

## 2021-08-19 ENCOUNTER — Telehealth: Payer: Self-pay | Admitting: Physician Assistant

## 2021-08-19 DIAGNOSIS — G8929 Other chronic pain: Secondary | ICD-10-CM

## 2021-08-19 DIAGNOSIS — F1020 Alcohol dependence, uncomplicated: Secondary | ICD-10-CM

## 2021-08-19 NOTE — Telephone Encounter (Signed)
Hospital increased her dosage of Gabapentin '200mg'$  two tablets tid she is asking for a refill of this medication. Can you advise?

## 2021-08-20 ENCOUNTER — Other Ambulatory Visit (HOSPITAL_BASED_OUTPATIENT_CLINIC_OR_DEPARTMENT_OTHER): Payer: Medicare Other | Admitting: Licensed Clinical Social Worker

## 2021-08-20 VITALS — BP 130/70 | HR 72 | Ht 62.0 in | Wt 144.0 lb

## 2021-08-20 DIAGNOSIS — F1020 Alcohol dependence, uncomplicated: Secondary | ICD-10-CM | POA: Diagnosis not present

## 2021-08-20 DIAGNOSIS — Z8659 Personal history of other mental and behavioral disorders: Secondary | ICD-10-CM | POA: Diagnosis not present

## 2021-08-20 DIAGNOSIS — Z9889 Other specified postprocedural states: Secondary | ICD-10-CM

## 2021-08-20 DIAGNOSIS — Z9189 Other specified personal risk factors, not elsewhere classified: Secondary | ICD-10-CM

## 2021-08-20 DIAGNOSIS — T1490XA Injury, unspecified, initial encounter: Secondary | ICD-10-CM | POA: Diagnosis not present

## 2021-08-20 DIAGNOSIS — Z8781 Personal history of (healed) traumatic fracture: Secondary | ICD-10-CM

## 2021-08-20 DIAGNOSIS — F1029 Alcohol dependence with unspecified alcohol-induced disorder: Secondary | ICD-10-CM

## 2021-08-20 DIAGNOSIS — R945 Abnormal results of liver function studies: Secondary | ICD-10-CM

## 2021-08-20 DIAGNOSIS — Z7409 Other reduced mobility: Secondary | ICD-10-CM

## 2021-08-20 DIAGNOSIS — F32A Depression, unspecified: Secondary | ICD-10-CM | POA: Diagnosis not present

## 2021-08-20 DIAGNOSIS — Z789 Other specified health status: Secondary | ICD-10-CM

## 2021-08-20 MED ORDER — GABAPENTIN 100 MG PO CAPS: 200.0000 mg | ORAL_CAPSULE | Freq: Three times a day (TID) | ORAL | 2 refills | Status: DC

## 2021-08-20 NOTE — Progress Notes (Signed)
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  Consultations: NA  Safety Concerns: RISK ASSESSMENT -Negative  Other:  NA     Darlyne Russian, PA-C  08/20/2021 1140am

## 2021-08-20 NOTE — Progress Notes (Signed)
Daily Group Progress Note  Program: CD IOP   Group Time: 9 a.m. to 12 p.m.   Type of Therapy: Process and Psychoeducational   Topic: The therapist checks in with group members, assesses for SI/HI/psychosis and overall level of functioning. The therapist inquires about sobriety date and number of community support meetings attended since last session. The therapist presents information on and facilitates group discussion on the topics of self-will as opposed to turning things over to one's Higher Power, the course of recovery and the reason that a year of sobriety is a milestone, and how substance use typically starts in an addict's youth with nicotine being the first drug use. The therapist shows a short video on the Cycle of Addiction focusing on the topic of shame and guilt. The therapist talks about how people are not defined by who they are and what they do when in the throws of the disease and how beating up on oneself is counterproductive and a part of the addictive cycle. The therapist encourages people to instead move from being problem-focused to solution-focused and to work on identifying all their triggers and how to avoid or respond to these triggers in a way that prevents them from relapsing. The therapist asks member to identify if they got anything helpful from today's group and asks them to identify and specific areas in which they need help and would like to be addressed in group.   Summary: Jenefer presents for group rating her depression as a "3" and anxiety as a "4" with no SI or HI. Shaunessy has not yet to attend an [place] meeting. Another group member encourages Corlis to consider attending a hybrid meeting noting the benefits of them. At group's end, Kahdijah says that she got a lot out of the discussion on the cycle of addiction. She also says that she got a lot out of hearing people discuss the benefits of attending meetings. The therapist notes that the members with the most sobriety in the [facility]  all attend meetings regularly. He also shares that people who attended [facility] IOP previously would did not get involved in something like AA or NA did not fair as well. Trayce says that she would like to hear more discussion on the topic of triggers.   Progress Towards Goals: Eryka reports no alcohol use.   UDS collected: No  Results: No  AA/NA attended?: No  Sponsor?: No   Adam Phenix, [place], LCSW, [facility], LCAS 08/20/2021

## 2021-08-23 ENCOUNTER — Other Ambulatory Visit (HOSPITAL_COMMUNITY): Payer: Medicare Other | Admitting: Licensed Clinical Social Worker

## 2021-08-23 DIAGNOSIS — F32A Depression, unspecified: Secondary | ICD-10-CM | POA: Diagnosis not present

## 2021-08-23 DIAGNOSIS — F1020 Alcohol dependence, uncomplicated: Secondary | ICD-10-CM

## 2021-08-25 ENCOUNTER — Other Ambulatory Visit (HOSPITAL_COMMUNITY): Payer: Medicare Other | Admitting: Licensed Clinical Social Worker

## 2021-08-25 DIAGNOSIS — F32A Depression, unspecified: Secondary | ICD-10-CM | POA: Diagnosis not present

## 2021-08-25 DIAGNOSIS — F1020 Alcohol dependence, uncomplicated: Secondary | ICD-10-CM

## 2021-08-25 NOTE — Progress Notes (Signed)
Daily Group Progress Note  Program: CD IOP   Group Time: 9 a.m. to 12 p.m.   Type of Therapy: Process and Psychoeducational   Topic: The therapist checks in with group members, assesses for SI/HI/psychosis and overall level of functioning. The therapist inquires about sobriety date and number of community support meetings attended since last session. The therapist facilities a discussion on two main topics today which are the biology of addiction and the critical importance of honesty if recovery is to work. The therapist covers the biology of addiction in response to a group member's suggesting that he will use willpower to not relapse in an extremely high risk as no one in group can connect what they have been taught about addiction as a brain-based diseased to why this is not a good plan. The therapist discusses the reasons that people are not honest in recovery and go on to pickup sobriety chips while actively in addiction noting that for many the character defect of pride stands in the way or rigorous honesty. The therapist emphasizes that it imperative that people in recovery let other people in their non-using support system know if they are thinking of using in addiction letting others know if they have had a slip so as to come up with a plan to prevent another.   Summary: Terri Ayala presents for group rating her depression as a "3" and anxiety as a "3" with no SI or HI. She has difficulty hearing what is said in group today as she again forgot her hearing aid. She has not made an AA meeting noting that it is her goal to get to one this week. She is now taking her Trazodone between 9 and 10 p.m. and falling asleep around 11 p.m. She says that she woke up this morning after about 5-6 hours of sleep feeling pretty good with no Trazodone hangover feeling. She questions the therapist about her positive test for a benzodiazepine with the therapist noting that it is likely related to a benzo she was given  after her surgery while inpatient. During the discussion about the need to let people know when one is thinking of using or has used, Terri Ayala admits that when she attended AA in the past that if she drank that she would simply stop going to meetings versus going back and picking up a white chip.     Progress Towards Goals: Terri Ayala reports no alcohol use.   UDS collected: No  Results: Positive for Oxazepam  AA/NA attended?: No  Sponsor?: No   Adam Phenix, [place], LCSW, [facility], LCAS 08/25/2021

## 2021-08-27 ENCOUNTER — Other Ambulatory Visit (HOSPITAL_COMMUNITY): Payer: Medicare Other | Admitting: Licensed Clinical Social Worker

## 2021-08-27 DIAGNOSIS — F32A Depression, unspecified: Secondary | ICD-10-CM | POA: Diagnosis not present

## 2021-08-27 DIAGNOSIS — F1020 Alcohol dependence, uncomplicated: Secondary | ICD-10-CM

## 2021-08-27 NOTE — Progress Notes (Signed)
Daily Group Progress Note  Program: CD IOP   Group Time: 9 a.m. to 12 p.m.   Type of Therapy: Process and Psychoeducational   Topic: The therapist checks in with group members, assesses for SI/HI/psychosis and overall level of functioning. The therapist inquires about sobriety date and number of community support meetings attended since last session. The therapist facilities a discussion on a number of topics including medication assisted treatment; spontaneous remission; the way in which the same disease, addiction, manifests differently in different people; and the family dynamics of addiction with a focus on co-dependency.  Summary: Dotty presents for group rating her depression as a "3" and anxiety as a "4" with no SI or HI. She attended an online meeting noting that she needs to attend more meetings. She is quiet and attentive throughout group and at the end of group says that she is thinking about what a woman shared at the meeting she attended. Skyy says that this woman was unable for a longtime to be able to maintain sobriety. 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   Progress Towards Goals: Delisia reports no alcohol use.   UDS collected: No  Results: No  AA/NA attended?: Yes  Sponsor?: No   Adam Phenix, [place], LCSW, [facility], LCAS 08/27/2021

## 2021-08-30 ENCOUNTER — Other Ambulatory Visit (HOSPITAL_COMMUNITY): Payer: Medicare Other | Attending: Psychiatry | Admitting: Licensed Clinical Social Worker

## 2021-08-30 DIAGNOSIS — Z7409 Other reduced mobility: Secondary | ICD-10-CM | POA: Insufficient documentation

## 2021-08-30 DIAGNOSIS — Z62819 Personal history of unspecified abuse in childhood: Secondary | ICD-10-CM | POA: Insufficient documentation

## 2021-08-30 DIAGNOSIS — Z79899 Other long term (current) drug therapy: Secondary | ICD-10-CM | POA: Insufficient documentation

## 2021-08-30 DIAGNOSIS — Z62810 Personal history of physical and sexual abuse in childhood: Secondary | ICD-10-CM | POA: Insufficient documentation

## 2021-08-30 DIAGNOSIS — R945 Abnormal results of liver function studies: Secondary | ICD-10-CM | POA: Diagnosis not present

## 2021-08-30 DIAGNOSIS — Z8781 Personal history of (healed) traumatic fracture: Secondary | ICD-10-CM | POA: Insufficient documentation

## 2021-08-30 DIAGNOSIS — R413 Other amnesia: Secondary | ICD-10-CM | POA: Insufficient documentation

## 2021-08-30 DIAGNOSIS — Z8659 Personal history of other mental and behavioral disorders: Secondary | ICD-10-CM | POA: Diagnosis not present

## 2021-08-30 DIAGNOSIS — Z9889 Other specified postprocedural states: Secondary | ICD-10-CM | POA: Insufficient documentation

## 2021-08-30 DIAGNOSIS — Z9189 Other specified personal risk factors, not elsewhere classified: Secondary | ICD-10-CM | POA: Diagnosis not present

## 2021-08-30 DIAGNOSIS — F1029 Alcohol dependence with unspecified alcohol-induced disorder: Secondary | ICD-10-CM | POA: Insufficient documentation

## 2021-08-30 DIAGNOSIS — F1020 Alcohol dependence, uncomplicated: Secondary | ICD-10-CM

## 2021-08-30 DIAGNOSIS — Z789 Other specified health status: Secondary | ICD-10-CM | POA: Insufficient documentation

## 2021-08-30 NOTE — Progress Notes (Signed)
Daily Group Progress Note  Program: CD IOP   Group Time: 9 a.m. to 12 p.m.   Type of Therapy: Process and Psychoeducational   Topic: The therapist checks in with group members, assesses for SI/HI/psychosis and overall level of functioning. The therapist inquires about sobriety date and number of community support meetings attended since last session. The therapist facilities a discussion on persons with dual diagnosis and the need for integrated treatment. The therapist also focuses on the importance of a person in recovery checking in each day to see how he or she is doing emotionally so as to offset emotional relapse. The therapist notes that keeping a daily journal is a good way to do this an uncover possible precipitants for feelings of anxiety, anger, depression, etc.  Starting at 11 a.m. and for the duration of group, the group has a guest speaker, Ms. Jacques Navy with [facility] who speaks on the topic of self-care focusing on nutrition, movement, and sleep.   Summary: Terri Ayala presents for group rating her depression as a "3" and anxiety as a "3" with no SI or HI. She has attended another Haematologist.   She is very quiet but attentive throughout group and during a check-in regarding emotions says that she is dealing with a "little bit of loss;" however, she does not elaborate beyond this.   Progress Towards Goals: Terri Ayala reports no alcohol use.   UDS collected: Yes  Results: No  AA/NA attended?: Yes  Sponsor?: No   Adam Phenix, [place], LCSW, [facility], [place] 08/30/2021

## 2021-09-01 ENCOUNTER — Encounter (HOSPITAL_COMMUNITY): Payer: Self-pay | Admitting: Licensed Clinical Social Worker

## 2021-09-01 ENCOUNTER — Other Ambulatory Visit (HOSPITAL_BASED_OUTPATIENT_CLINIC_OR_DEPARTMENT_OTHER): Payer: Medicare Other | Admitting: Licensed Clinical Social Worker

## 2021-09-01 DIAGNOSIS — Z7409 Other reduced mobility: Secondary | ICD-10-CM

## 2021-09-01 DIAGNOSIS — F1020 Alcohol dependence, uncomplicated: Secondary | ICD-10-CM

## 2021-09-01 DIAGNOSIS — Z9189 Other specified personal risk factors, not elsewhere classified: Secondary | ICD-10-CM

## 2021-09-01 DIAGNOSIS — Z9889 Other specified postprocedural states: Secondary | ICD-10-CM

## 2021-09-01 DIAGNOSIS — R945 Abnormal results of liver function studies: Secondary | ICD-10-CM

## 2021-09-01 DIAGNOSIS — Z8659 Personal history of other mental and behavioral disorders: Secondary | ICD-10-CM

## 2021-09-01 DIAGNOSIS — Z789 Other specified health status: Secondary | ICD-10-CM

## 2021-09-01 DIAGNOSIS — F1029 Alcohol dependence with unspecified alcohol-induced disorder: Secondary | ICD-10-CM | POA: Diagnosis not present

## 2021-09-01 DIAGNOSIS — Z8781 Personal history of (healed) traumatic fracture: Secondary | ICD-10-CM

## 2021-09-01 DIAGNOSIS — T1490XA Injury, unspecified, initial encounter: Secondary | ICD-10-CM

## 2021-09-01 MED ORDER — BUPROPION HCL ER (SR) 200 MG PO TB12: 200.0000 mg | ORAL_TABLET | Freq: Two times a day (BID) | ORAL | 2 refills | Status: DC
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
## 2021-09-02 ENCOUNTER — Encounter (HOSPITAL_COMMUNITY): Payer: Self-pay | Admitting: Licensed Clinical Social Worker

## 2021-09-02 NOTE — Addendum Note (Signed)
Addended by: Dara Hoyer on: 09/02/2021 11:52 AM   Modules accepted: Orders

## 2021-09-02 NOTE — Addendum Note (Signed)
Addended by: Dara Hoyer on: 09/02/2021 11:02 AM   Modules accepted: Level of Service

## 2021-09-03 ENCOUNTER — Other Ambulatory Visit (HOSPITAL_COMMUNITY): Payer: Medicare Other | Admitting: Licensed Clinical Social Worker

## 2021-09-03 DIAGNOSIS — F1029 Alcohol dependence with unspecified alcohol-induced disorder: Secondary | ICD-10-CM | POA: Diagnosis not present

## 2021-09-03 DIAGNOSIS — F1020 Alcohol dependence, uncomplicated: Secondary | ICD-10-CM

## 2021-09-03 NOTE — Progress Notes (Signed)
Daily Group Progress Note  Program: CD IOP   Group Time: 10:30 a.m. to 12 p.m.   Type of Therapy: Process and Psychoeducational   Topic: The therapist checks in with group members, assesses for SI/HI/psychosis and overall level of functioning. The therapist inquires about sobriety date and number of community support meetings attended since last session. The therapist facilitates a discussion on dangerous chemicals being mixed into various street drugs, Fentanyl, and the safety of vaping versus smoking cigarettes. The therapist clarifies that research now demonstrates that behavioral health clinicians were wrong to advise people who were trying to quit other substances to not also try and quit smoking at the same time.  The therapist spends much of the session focusing on the need for persons in recovery to attend to their emotional states versus "living from the neck up" in order to prevent emotional relapse. The therapist discusses the topics of grief and trauma as they relate to recovery.   Summary: Terri Ayala presents for group rating her depression as a "4" and anxiety as a "4" with no SI or HI.   She arrives late for group as she had a doctor's appointment this morning. During the discussion on emotions, Terri Ayala admits that she avoids feeling her emotions so as to not drink.  The therapist notes that hopefully through [place] attendance that Terri Ayala can build some relationships in which she feels comfortable opening up about her feelings and can get support and be able to process them without feeling the need to drink.  She says that she is going to attend the in person AA meeting tomorrow.    Progress Towards Goals: Terri Ayala reports no alcohol use.   UDS collected: No  Results: No  AA/NA attended?: No  Sponsor?: No   Adam Phenix, [place], LCSW, [facility], LCAS 09/03/2021

## 2021-09-06 ENCOUNTER — Telehealth (HOSPITAL_COMMUNITY): Payer: Self-pay | Admitting: Licensed Clinical Social Worker

## 2021-09-06 ENCOUNTER — Encounter (HOSPITAL_COMMUNITY): Payer: Medicare Other

## 2021-09-06 NOTE — Telephone Encounter (Signed)
Terri Ayala leaves a voicemail at 8:32 a.m. saying that she will likely be 30 minutes late for group and then later leaves a message at 11:22 a.m. saying that she will not make it to group as she had to turn around as she forgot that she had locked her "storm door" and her "caretaker" was coming. She says that she will be in group on Wednesday.  Adam Phenix, [place], LCSW, [facility], Veteran 09/06/2021

## 2021-09-08 ENCOUNTER — Encounter (HOSPITAL_COMMUNITY): Payer: Self-pay | Admitting: Medical

## 2021-09-08 ENCOUNTER — Other Ambulatory Visit (HOSPITAL_COMMUNITY): Payer: Medicare Other | Admitting: Licensed Clinical Social Worker

## 2021-09-08 DIAGNOSIS — T1490XA Injury, unspecified, initial encounter: Secondary | ICD-10-CM

## 2021-09-08 DIAGNOSIS — Z8659 Personal history of other mental and behavioral disorders: Secondary | ICD-10-CM

## 2021-09-08 DIAGNOSIS — R945 Abnormal results of liver function studies: Secondary | ICD-10-CM

## 2021-09-08 DIAGNOSIS — Z789 Other specified health status: Secondary | ICD-10-CM

## 2021-09-08 DIAGNOSIS — Z9189 Other specified personal risk factors, not elsewhere classified: Secondary | ICD-10-CM

## 2021-09-08 DIAGNOSIS — R413 Other amnesia: Secondary | ICD-10-CM

## 2021-09-08 DIAGNOSIS — F1029 Alcohol dependence with unspecified alcohol-induced disorder: Secondary | ICD-10-CM

## 2021-09-08 DIAGNOSIS — F1020 Alcohol dependence, uncomplicated: Secondary | ICD-10-CM

## 2021-09-08 DIAGNOSIS — Z9889 Other specified postprocedural states: Secondary | ICD-10-CM

## 2021-09-08 NOTE — Progress Notes (Signed)
Daily Group Progress Note  Program: CD IOP   Group Time: 9 a.m. to 12 p.m.   Type of Therapy: Process and Psychoeducational   Topic: The therapist checks in with group members, assesses for SI/HI/psychosis and overall level of functioning. The therapist inquires about sobriety date and number of community support meetings attended since last session. The therapist introduces a new group member and discusses how a person's withdrawal from alcohol will only get worse over time and can eventually end up in death. The therapist also educates group members about Kratom and the reason that people on opioids are not detoxed but put on Methadone or Suboxone as the person on an opioid loses tolerance unlike people with alcohol dependence who have tolerance for life. The therapist discusses how it is tempting to turn to substances for temporary relief of painful emotional state; however, how this impedes a person developing good problem-solving skills. The therapist talks about the importance of family members of people with addiction understanding it as a disease versus a character defect to as to not feed into the recovering person's shame spiral after a slip or relapse. The therapist talks about the problems associated with criminalizing addiction and the myth of living "happily ever after." He concludes with a review of last session's discussion on the importance of scheduling and thought-stopping techniques.   The therapist stresses that people should call their Sponsors and let them they are not doing well emotionally even if they have yet to start having thoughts of using.   Summary: Terri Ayala presents for group rating her depression as a "3" and anxiety as a "4" with no SI or HI.   She says that she took her Trazodone too late so the therapist recommends Terri Ayala setting a daily timer such that she remembers to take her Trazodone the same time each day. She says that she will do this.  She attended the in-person  meeting on Saturday and agrees that scheduling when she will go to meetings will be helpful and getting up on time.  She talks about a surprise 9K medical bill that she got which she does not believe she should have and her efforts to address this. Terri Ayala is praised for not having gotten drunk over this, and group members give her feedback on ways to resolve this situation.  She says that her takeaway from group today is that is she talks with people about things that it will "help you."   Progress Towards Goals: Terri Ayala reports no alcohol use.   UDS collected: No  Results: No  AA/NA attended?: Yes  Sponsor?: No   Terri Ayala, [place], LCSW, [facility], [place] [facility] 09/08/2021
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
## 2021-09-08 NOTE — Addendum Note (Signed)
Addended by: Dara Hoyer on: 09/08/2021 03:40 PM   Modules accepted: Orders

## 2021-09-10 ENCOUNTER — Other Ambulatory Visit (HOSPITAL_COMMUNITY): Payer: Medicare Other | Admitting: Licensed Clinical Social Worker

## 2021-09-10 DIAGNOSIS — F1029 Alcohol dependence with unspecified alcohol-induced disorder: Secondary | ICD-10-CM | POA: Diagnosis not present

## 2021-09-10 DIAGNOSIS — F1020 Alcohol dependence, uncomplicated: Secondary | ICD-10-CM

## 2021-09-10 NOTE — Progress Notes (Signed)
Daily Group Progress Note  Program: CD IOP   Group Time: 9 a.m. to 12 p.m.   Type of Therapy: Process and Psychoeducational   Topic: The therapist checks in with group members, assesses for SI/HI/psychosis and overall level of functioning. The therapist inquires about sobriety date and number of community support meetings attended since last session.   The therapist encourages group members to discuss challenges in her lives at present and provides feedback while soliciting feedback from group members as well.   The therapist continues to present material on how to cope with triggers. He shows a video, "Addiction Requires Dishonesty" eliciting group members' reactions to it as well as another video on How to Stop Addictive Behavior that addresses the topics of limiting beliefs which impede recovery and humility as key component of recovery.   The therapist suggests ways to cope with grief over the death of loved ones without relapsing.   Summary: Benjamin presents for group rating her depression as a "4" and anxiety as a "4" with no SI or HI.   She says that she took her Trazodone at the correct time last night and notes that she is now on Wellbutrin having been on it since last Saturday. The therapist provides additional psycho-education about Wellbutrin. Cora says that when she drives that she tends to get "highway hypnosis" and has to note each exit or otherwise will get lost. The therapist discusses how some of her cognitive issues may be related to [place].   Hareem says that she became a person "used to lying" in her addiction but says that if a person comes to therapy and is not honest that he or she are "wasting" their "money."   She says that she missed a virtual speaker meeting that she really wanted to attend last night. Cheryll repeatedly seems to draw the same message from all of the material presented today which she believes is that she needs to change her whole personality. She admits that she  could be "nasty" when she drank and that she had the limiting belief that she would never be able to have fun without alcohol.  She says that her uncle is seriously ill so realizes that his death could present as a trigger to drink so is encouraged to get support from people in meetings.   Progress Towards Goals: Josue reports no alcohol use.   UDS collected: No  Results: No  AA/NA attended?: No  Sponsor?: No   Adam Phenix, [place], LCSW, [facility], LCAS 09/10/2021

## 2021-09-13 ENCOUNTER — Other Ambulatory Visit (HOSPITAL_COMMUNITY): Payer: Medicare Other | Admitting: Licensed Clinical Social Worker

## 2021-09-13 ENCOUNTER — Encounter (HOSPITAL_COMMUNITY): Payer: Self-pay | Admitting: Medical

## 2021-09-13 DIAGNOSIS — Z8781 Personal history of (healed) traumatic fracture: Secondary | ICD-10-CM

## 2021-09-13 DIAGNOSIS — R945 Abnormal results of liver function studies: Secondary | ICD-10-CM

## 2021-09-13 DIAGNOSIS — R413 Other amnesia: Secondary | ICD-10-CM

## 2021-09-13 DIAGNOSIS — Z789 Other specified health status: Secondary | ICD-10-CM

## 2021-09-13 DIAGNOSIS — T1490XA Injury, unspecified, initial encounter: Secondary | ICD-10-CM

## 2021-09-13 DIAGNOSIS — Z8659 Personal history of other mental and behavioral disorders: Secondary | ICD-10-CM

## 2021-09-13 DIAGNOSIS — F1029 Alcohol dependence with unspecified alcohol-induced disorder: Secondary | ICD-10-CM | POA: Diagnosis not present

## 2021-09-13 DIAGNOSIS — Z8669 Personal history of other diseases of the nervous system and sense organs: Secondary | ICD-10-CM

## 2021-09-13 DIAGNOSIS — Z9189 Other specified personal risk factors, not elsewhere classified: Secondary | ICD-10-CM

## 2021-09-13 DIAGNOSIS — F1020 Alcohol dependence, uncomplicated: Secondary | ICD-10-CM

## 2021-09-13 MED ORDER — BUPROPION HCL ER (SR) 100 MG PO TB12: 100.0000 mg | ORAL_TABLET | Freq: Two times a day (BID) | ORAL | 0 refills | Status: DC

## 2021-09-13 NOTE — Progress Notes (Signed)
Daily Group Progress Note  Program: CD IOP   Group Time: 9 a.m. to 12 p.m.   Type of Therapy: Process and Psychoeducational   Topic: The therapist checks in with group members, assesses for SI/HI/psychosis and overall level of functioning. The therapist inquires about sobriety date and number of community support meetings attended since last session. The therapist spends much of the first half of the group processing two group members' relapses over the weekend helping them to identify triggers that they need to avoid and to develop a relapse prevention plan. The therapist discusses how recovery takes place in fellowship but how addiction thrives in [place]. The therapist educates group members on self-talk and how negative self-talk when left unchecked can trigger relapse. He has group members complete ERS 2A External Trigger Questionnnaire and ERS 2B External Trigger Chart and discuss what they learned from doing this. He talks about [place] attendance as a path to building non-using supports and a way to avoid isolation suggesting members attend as close to 65 meetings in ninety days as possible.     Summary: Yalda presents for group rating her depression as a "4" and anxiety as a "3" with no SI or HI.   She says that she did not get to a meeting on Saturday and was going to attend a meeting on-line last night but fell asleep.  Krysti says that she stopped her Wellbutrin as it was giving her every side effect on the list and she was losing concentration when driving to resumed her Cymbalta. The therapist encourages her to get with the P.A. today about these issues and possibly get the genetic testing to see what medications would like work best for her.  In doing today's group exercises, Bryla says that she would drink 100% of the time if at a party with drinking and a lot of people. She says that she almost never drinks in a small group as her drinking would not go unnoticed. She never drinks when at  [place] or at wakes.  She says that her uncle is about to be released from the hospital and receives support from other group members about this situation.  Admire identifies isolation as being her biggest trigger with the therapist suggesting that she try to attend ninety-in-ninety noting that her current techniques for not drinking in isolation may not work indefinitely and that meetings will help to eliminate this trigger.   Progress Towards Goals: Daleena reports no alcohol use.   UDS collected: Yes  Results: Yes, negative for alcohol.   AA/NA attended?: No  Sponsor?: No   Adam Phenix, [place], [place], [facility], LCAS 09/13/2021

## 2021-09-13 NOTE — Progress Notes (Signed)
Patient ID: Terri Ayala, female   DOB: 1955-05-25, 66 y.o.   MRN: [id number] FU on /Medication management. Pt not able to tolerate direct switch from Cymbalta to Wellbutrin. Plan: Decrease Wellbutrin SR to '100mg'$  Take 1 30 mg Cymbalta ER taper FU Wednesday

## 2021-09-14 ENCOUNTER — Other Ambulatory Visit (HOSPITAL_COMMUNITY): Payer: Medicare Other

## 2021-09-15 ENCOUNTER — Other Ambulatory Visit (HOSPITAL_COMMUNITY): Payer: Medicare Other | Admitting: Licensed Clinical Social Worker

## 2021-09-15 DIAGNOSIS — F1020 Alcohol dependence, uncomplicated: Secondary | ICD-10-CM

## 2021-09-15 DIAGNOSIS — F1029 Alcohol dependence with unspecified alcohol-induced disorder: Secondary | ICD-10-CM | POA: Diagnosis not present

## 2021-09-15 NOTE — Progress Notes (Signed)
Daily Group Progress Note  Program: CD IOP   Group Time: 9 a.m. to 10:30 a.m.  Type of Therapy: Process and Psychoeducational   Topic: The therapist checks in with group members, assesses for SI/HI/psychosis and overall level of functioning. The therapist inquires about sobriety date and number of community support meetings attended since last session. The therapist has group members complete introductions discussing how they came to be in group as there is a new member in attendance today.   The therapist facilitates a discussion on the role of honesty in recovery and defines what is meant by having a "reservation" that could potentially lead to relapse. The therapist highlights how recovery can often involve sacrifice such as not being able to attend a concert, take a trip, etcetera if these activities are a trigger for which the person in recovery is not yet equipped to handle due to a lack brain-based recovery.    The therapist shows a video, "The Space Between Self-Esteem and Self-Compassion." The therapist solicits group members' reactions to this video and encourages a focus on self-compassion rather than self-esteem which involves a threat to one's self-concept.    Summary: Kamaljit presents for group rating her depression as a "4" and anxiety as a "4" with no SI or HI.   She has still not attended any meetings noting that she is having a hard time concentrating. 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 She says that she hired an Forensic psychologist through [employer] to help with the [place] care bill and their refusal to provide an itemized bill.  She says, "I sit on the fence" between recovery and drinking saying that she still thinks about drinking and has cravings but that the Baclofen is helping.  She says that at one point that she had 5 years of sobriety.  The therapist asks what she does during the day with Maylani saying that she occupies her time watching movies.  The therapist, again, strongly encourages her to start attending meetings for social support suggesting that her "white-knuckling it" on her how has the potential to lead to relapse.   Progress Towards Goals: Mica reports no alcohol use.   UDS collected: No  Results: No  AA/NA attended?: No  Sponsor?: No   Adam Phenix, [place], LCSW, [facility], LCAS 09/15/2021

## 2021-09-16 ENCOUNTER — Other Ambulatory Visit (HOSPITAL_COMMUNITY): Payer: Medicare Other

## 2021-09-17 ENCOUNTER — Other Ambulatory Visit (HOSPITAL_COMMUNITY): Payer: Medicare Other | Admitting: Licensed Clinical Social Worker

## 2021-09-17 DIAGNOSIS — F1029 Alcohol dependence with unspecified alcohol-induced disorder: Secondary | ICD-10-CM | POA: Diagnosis not present

## 2021-09-17 DIAGNOSIS — F1020 Alcohol dependence, uncomplicated: Secondary | ICD-10-CM

## 2021-09-17 NOTE — Progress Notes (Signed)
Daily Group Progress Note  Program: CD IOP   Group Time: 9 a.m. to 12 p.m.  Type of Therapy: Process and Psychoeducational   Topic: The therapist checks in with group members, assesses for SI/HI/psychosis and overall level of functioning. The therapist inquires about sobriety date and number of community support meetings attended since last session.   The therapist introduces a new member to group having all of the group members introduce themselves giving a brief summary of the events that led up to their attending [facility] IOP.   The therapist facilitates a group discussion on emotional triggers having group members complete and discuss Matrix handouts, ERS 3A and ERS 3B. The therapist also shows the group a video on relapse and emotional triggers facilitating a discussion regarding this content as well.     Summary: Terri Ayala presents for group rating her depression as a "4" and anxiety as a "4" with no SI or HI.   Terri Ayala says that she did attend a Twelve Step meeting last night. The therapist again recommends that Terri Ayala take one of the monthly schedules group the group room and write down which meetings and on what days she will be attending.   Terri Ayala identifies that her two biggest emotional triggers are feeling overwhelmed and being isolated and feeling lonely and depressed. She says that she hates to feel "scattered."  She says that attending the meeting helped to reduce the feelings of loneliness and she recognizes that she has to prioritize and try to relax when feeling overwhelmed.   Progress Towards Goals: Terri Ayala reports no alcohol use.   UDS collected: No  Results: No  AA/NA attended?: Yes  Sponsor?: No   Adam Phenix, [place], LCSW, [facility], [place] 09/17/2021

## 2021-09-20 ENCOUNTER — Other Ambulatory Visit: Payer: Self-pay | Admitting: Physician Assistant

## 2021-09-20 ENCOUNTER — Other Ambulatory Visit (HOSPITAL_COMMUNITY): Payer: Medicare Other | Admitting: Licensed Clinical Social Worker

## 2021-09-20 DIAGNOSIS — F1020 Alcohol dependence, uncomplicated: Secondary | ICD-10-CM

## 2021-09-20 DIAGNOSIS — F1029 Alcohol dependence with unspecified alcohol-induced disorder: Secondary | ICD-10-CM | POA: Diagnosis not present

## 2021-09-20 NOTE — Progress Notes (Signed)
Daily Group Progress Note  Program: CD IOP   Group Time: 9:20 a.m. to 12 p.m.  Type of Therapy: Process and Psychoeducational   Topic: The therapist checks in with group members, assesses for SI/HI/psychosis and overall level of functioning. The therapist inquires about sobriety date and number of community support meetings attended since last session.   The therapist facilitates a group discussion concerning what group members liked most about using or miss most about using and the things that they do not like that caused them to start attending [facility] IOP.  The therapist presents Matrix Module ERS 4A, 51-VOHY Introduction, eliciting feedback from group members on this topic.      Summary: Terri Ayala presents for group rating her depression as a "4" and anxiety as a "4" with no SI or HI.   She arrives late for group as her dog had an accident and she had to clean it up.  She says that she is experiencing the same things as last week noting that she had to make a plan so as to not get so overwhelmed. She is still trying to resolve the medical bill issue.  Terri Ayala says that if she were to drink that it would alleviate some of the emotional distress that she feels about this bill; however, it would eventually make her unable to follow-through on resolving the issue and would lead her to detox.  She says that she too has a "fatty liver" like another group member. She attended a virtual meeting last night. She is agreeable to making three meetings per week which she has not been doing.   Progress Towards Goals: Terri Ayala reports no alcohol use.   UDS collected: Yes Results: Yes; negative for alcohol  AA/NA attended?: Yes  Sponsor?: No   Adam Phenix, [place], LCSW, [facility], [place] 09/20/2021

## 2021-09-21 ENCOUNTER — Other Ambulatory Visit: Payer: Self-pay | Admitting: Physician Assistant

## 2021-09-21 ENCOUNTER — Other Ambulatory Visit (HOSPITAL_COMMUNITY): Payer: Medicare Other

## 2021-09-22 ENCOUNTER — Other Ambulatory Visit (HOSPITAL_COMMUNITY): Payer: Medicare Other | Admitting: Licensed Clinical Social Worker

## 2021-09-22 DIAGNOSIS — F1020 Alcohol dependence, uncomplicated: Secondary | ICD-10-CM

## 2021-09-22 DIAGNOSIS — F1029 Alcohol dependence with unspecified alcohol-induced disorder: Secondary | ICD-10-CM | POA: Diagnosis not present
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
## 2021-09-23 ENCOUNTER — Other Ambulatory Visit (HOSPITAL_COMMUNITY): Payer: Medicare Other

## 2021-09-24 ENCOUNTER — Telehealth (HOSPITAL_COMMUNITY): Payer: Self-pay | Admitting: Licensed Clinical Social Worker

## 2021-09-24 ENCOUNTER — Other Ambulatory Visit (HOSPITAL_COMMUNITY): Payer: Medicare Other

## 2021-09-24 NOTE — Telephone Encounter (Signed)
The therapist receives a voicemail from [place] saying that she will not be in group today as she has to meet with her attorney.  She asks the therapist to let another group member know that she can attend a mid-day, AA meeting with her on Monday if interested.  Adam Phenix, [place], LCSW, [facility], [place] 09/24/2021

## 2021-09-27 ENCOUNTER — Encounter (HOSPITAL_COMMUNITY): Payer: Medicare Other

## 2021-09-27 ENCOUNTER — Telehealth (HOSPITAL_COMMUNITY): Payer: Self-pay | Admitting: Licensed Clinical Social Worker

## 2021-09-27 NOTE — Telephone Encounter (Signed)
The therapist attempts to reach [facility] after she does not show for group. The therapist leaves a HIPAA-compliant voicemail.  Adam Phenix, [place], LCSW, [facility], [place] 09/27/2021

## 2021-09-29 ENCOUNTER — Other Ambulatory Visit (HOSPITAL_COMMUNITY): Payer: Medicare Other | Attending: Psychiatry | Admitting: Licensed Clinical Social Worker

## 2021-09-29 DIAGNOSIS — F1029 Alcohol dependence with unspecified alcohol-induced disorder: Secondary | ICD-10-CM | POA: Diagnosis present

## 2021-09-29 DIAGNOSIS — F1021 Alcohol dependence, in remission: Secondary | ICD-10-CM | POA: Insufficient documentation

## 2021-09-29 DIAGNOSIS — F1020 Alcohol dependence, uncomplicated: Secondary | ICD-10-CM

## 2021-09-29 DIAGNOSIS — R413 Other amnesia: Secondary | ICD-10-CM | POA: Diagnosis not present

## 2021-09-29 DIAGNOSIS — G4700 Insomnia, unspecified: Secondary | ICD-10-CM | POA: Diagnosis not present

## 2021-09-29 NOTE — Progress Notes (Signed)
Daily Group Progress Note  Program: CD IOP   Group Time: 9 a.m. to 12 p.m.  Type of Therapy: Process and Psychoeducational   Topic: The therapist checks in with group members, assesses for SI/HI/psychosis and overall level of functioning. The therapist inquires about sobriety date and number of community support meetings attended since last session.   The therapist has group members watch a guided meditation for anxiety and solicits their reactions to this while talking about the importance of diaphragmatic breathing.   He talks about how substance use can cause people to stop taking prescribed medications for other medical conditions thus impacting a person's overall health. He notes that not allowing anxiety to lead to relapse involves having some faith in one's Higher Power and points out that working on Steps 11 and 12 can be a lifelong process.   The therapist covers Matrix Module ERS 6B, Alcohol Arguments and ERS 7A Thoughts, Emotions and Behaviors. He shows a video on cognitive distortions and then another video on how to use the RET ABCDE model to work on getting rid of these cognitive distortions that can lead to relapse. The therapist notes that group members can apply this model via journaling if they so choose.      Summary: Terri Ayala presents for group rating her depression as a "5" and anxiety as a "5" with no SI or HI.   She continues to not have a Sponsor saying that she is now focused on spreading out her AA attendance to difference meetings and eventually finding a home group. She says that she worked up to [employer] 5 at [employer] with another group member noting that Terri Ayala should expect to start back on Step 1 when she gets a Publishing copy as steps doing while inpatient are not always done thoroughly due to a person's impaired cognitive state.  Terri Ayala missed group Monday as the meeting with the attorney did not go as expected. She says that she is so stressed that she has to tell herself to  breath. She is endeavoring to manage stress without drinking.  She is agreeable to meeting with this therapist on Friday after group so they can call Medicare and her secondary insurance to see if they can get some resolution regarding her 9K medical bill. Apparently, Medicare only covered 20 days while her secondary insurance covered longer; however, as Medicare did not cover past 20 days, either the facility did not bill beyond this or the claim was denied so was not forwarded to her secondary insurance.   Progress Towards Goals: Terri Ayala reports no alcohol use.   UDS collected: No Results: Yes, negative for alcohol  AA/NA attended?: Yes  Sponsor?: No   Adam Phenix, [place], LCSW, [facility], [place] 09/29/2021

## 2021-10-01 ENCOUNTER — Other Ambulatory Visit (HOSPITAL_COMMUNITY): Payer: Medicare Other | Admitting: Licensed Clinical Social Worker

## 2021-10-01 DIAGNOSIS — F1020 Alcohol dependence, uncomplicated: Secondary | ICD-10-CM

## 2021-10-01 NOTE — Progress Notes (Signed)
Daily Group Progress Note  Program: CD IOP   Group Time: 9 a.m. to 12 p.m.  Type of Therapy: Process and Psychoeducational   opic: The therapist checks in with group members, assesses for SI/HI/psychosis and overall level of functioning. The therapist inquires about sobriety date and number of community support meetings attended since last session.   The therapist has members introduce themselves and how they came to be in [place] IOP as there is a new group member in attendance today.  The therapist discusses the stages of change in recovery and shares copies of the [place] curve asking group members to determine where they are on the curve.   The therapist validates that it is frustrating when family members want to put limits on money, driving, etcetera and put tracking devices on their phones and automobiles; however, the therapist plays the devil's advocate in challenging group members to look at the reasons that their family members feel the need to do this.   The therapist talks about how using substances can increase the likelihood that one's children will also eventually use substances suggesting that if parents do not want their kids to not use substances that they must model these behaviors for them.  He discusses that the responsibility for working one's program ultimately is in the hands of the recovering person and no one else.     Summary: Aviannah presents for group rating her depression as a "4" and anxiety as a "5" with no SI or HI.   She introduces herself to the new group member and talks about how she came to [place] IOP as she could no longer take the toll that her drinking was having on her body with detoxes requiring hospitalization.  She is mostly quiet but attentive throughout group and notes that she is tired of talking about the hospital bill situation and still getting through it without drinking.   She asks the therapist how to locate the short anxiety meditation viewed in  last group as she liked it and wants to use it again.  Keisi decides to reschedule the individual meeting with this therapist today to address the insurance issues noting that she will do it either next Wednesday or Friday and is asked to bring the bills for when calls to insurance are made.   Progress Towards Goals: Kristell reports no alcohol use.   UDS collected: No Results: No  AA/NA attended?: Yes  Sponsor?: No   Adam Phenix, [place], LCSW, [facility], LCAS 10/01/2021

## 2021-10-04 ENCOUNTER — Other Ambulatory Visit: Payer: Self-pay | Admitting: Physician Assistant

## 2021-10-04 ENCOUNTER — Other Ambulatory Visit (HOSPITAL_COMMUNITY): Payer: Medicare Other | Admitting: Licensed Clinical Social Worker

## 2021-10-04 DIAGNOSIS — F1020 Alcohol dependence, uncomplicated: Secondary | ICD-10-CM

## 2021-10-04 DIAGNOSIS — F1029 Alcohol dependence with unspecified alcohol-induced disorder: Secondary | ICD-10-CM | POA: Diagnosis not present
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
## 2021-10-06 ENCOUNTER — Other Ambulatory Visit (HOSPITAL_BASED_OUTPATIENT_CLINIC_OR_DEPARTMENT_OTHER): Payer: Medicare Other | Admitting: Licensed Clinical Social Worker

## 2021-10-06 ENCOUNTER — Encounter (HOSPITAL_COMMUNITY): Payer: Self-pay | Admitting: Licensed Clinical Social Worker

## 2021-10-06 DIAGNOSIS — F1021 Alcohol dependence, in remission: Secondary | ICD-10-CM

## 2021-10-06 DIAGNOSIS — Z7409 Other reduced mobility: Secondary | ICD-10-CM

## 2021-10-06 DIAGNOSIS — Z9189 Other specified personal risk factors, not elsewhere classified: Secondary | ICD-10-CM

## 2021-10-06 DIAGNOSIS — Z8781 Personal history of (healed) traumatic fracture: Secondary | ICD-10-CM

## 2021-10-06 DIAGNOSIS — Z8659 Personal history of other mental and behavioral disorders: Secondary | ICD-10-CM

## 2021-10-06 DIAGNOSIS — T1490XA Injury, unspecified, initial encounter: Secondary | ICD-10-CM

## 2021-10-06 DIAGNOSIS — Z9889 Other specified postprocedural states: Secondary | ICD-10-CM

## 2021-10-06 DIAGNOSIS — Z8669 Personal history of other diseases of the nervous system and sense organs: Secondary | ICD-10-CM

## 2021-10-06 DIAGNOSIS — Z789 Other specified health status: Secondary | ICD-10-CM

## 2021-10-06 DIAGNOSIS — R413 Other amnesia: Secondary | ICD-10-CM

## 2021-10-06 DIAGNOSIS — R945 Abnormal results of liver function studies: Secondary | ICD-10-CM

## 2021-10-06 DIAGNOSIS — F1029 Alcohol dependence with unspecified alcohol-induced disorder: Secondary | ICD-10-CM

## 2021-10-06 DIAGNOSIS — T50905D Adverse effect of unspecified drugs, medicaments and biological substances, subsequent encounter: Secondary | ICD-10-CM
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
## 2021-10-06 NOTE — Progress Notes (Signed)
Daily Group Progress Note  Program: CD IOP   Group Time: 9 a.m. to 12 p.m.  Type of Therapy: Process and Psychoeducational   Topic: The therapist checks in with group members, assesses for SI/HI/psychosis and overall level of functioning. The therapist inquires about sobriety date and number of community support meetings attended since last session.    The therapist facilitates discussions on a wide range of topics including what is meant by a "dry drunk," how people with years of sobriety can develop amnesia for how bad things were when using leading to a relapse, the difference between an acute and a chronic illness like addiction, dealing with the stigma associated with substance dependence, and what a "burning desire" is at an [place] or NA meeting.   In discussing addiction as a disease  verses a choice, the therapist poses the question, who would choose to be an addict? The therapist educates members on how long it takes the body to process alcohol and why some people can still be legally drunk the next morning after a heavy night of drinking.   The therapist shows a video illustrating that there is no such thing as a hard versus a soft drug and there is no difference between a person addicted to drugs or a person addicted to alcohol.     Summary: Terri Ayala presents for group rating her depression as a "3" and anxiety as a "4" with no SI or HI.   She has not attended a meeting since last group but plans on doing so. Another member recommends some possible Sponsors for [employer] and offers again to attend a noon meeting with her. Terri Ayala asks about her GenePsych testing to see if it came back.  At the conclusion of group, she notes that she always believed the stigma or drugs to be worse than alcohol but recognizes now that alcohol is no different and can harm or kill a person in numerous ways.   Progress Towards Goals: Terri Ayala reports no alcohol use.   UDS collected: No Results: No  AA/NA attended?:  No  Sponsor?: No  Individual Time: 12:15 p.m. to 12:30 p.m.  The therapist meets with Terri Ayala after group about her [place] bill from [facility] and gives her the homework of getting the Medicare EOB from her stay there and to call Medicare to find out if the 20 day stay authorized by Medicare is a hard limit or if she could have gotten authorized for more days, as her mother did, if her doctor would have submitted a letter documenting medical necessity.    Adam Phenix, [place], LCSW, [facility], [place] 10/06/2021

## 2021-10-08 ENCOUNTER — Other Ambulatory Visit (HOSPITAL_COMMUNITY): Payer: Medicare Other | Attending: Psychiatry | Admitting: Licensed Clinical Social Worker

## 2021-10-08 DIAGNOSIS — R945 Abnormal results of liver function studies: Secondary | ICD-10-CM | POA: Diagnosis not present

## 2021-10-08 DIAGNOSIS — F1029 Alcohol dependence with unspecified alcohol-induced disorder: Secondary | ICD-10-CM | POA: Diagnosis not present

## 2021-10-08 DIAGNOSIS — Z8659 Personal history of other mental and behavioral disorders: Secondary | ICD-10-CM | POA: Insufficient documentation

## 2021-10-08 DIAGNOSIS — F1021 Alcohol dependence, in remission: Secondary | ICD-10-CM | POA: Insufficient documentation

## 2021-10-08 DIAGNOSIS — Z79899 Other long term (current) drug therapy: Secondary | ICD-10-CM | POA: Diagnosis not present

## 2021-10-08 DIAGNOSIS — Z9189 Other specified personal risk factors, not elsewhere classified: Secondary | ICD-10-CM | POA: Diagnosis not present

## 2021-10-08 DIAGNOSIS — L718 Other rosacea: Secondary | ICD-10-CM | POA: Diagnosis not present

## 2021-10-08 DIAGNOSIS — T1490XA Injury, unspecified, initial encounter: Secondary | ICD-10-CM | POA: Diagnosis not present

## 2021-10-08 DIAGNOSIS — Z7409 Other reduced mobility: Secondary | ICD-10-CM | POA: Insufficient documentation
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
## 2021-10-11 ENCOUNTER — Other Ambulatory Visit (HOSPITAL_COMMUNITY): Payer: Medicare Other | Attending: Psychiatry | Admitting: Licensed Clinical Social Worker

## 2021-10-11 DIAGNOSIS — F32A Depression, unspecified: Secondary | ICD-10-CM | POA: Diagnosis not present

## 2021-10-11 DIAGNOSIS — F419 Anxiety disorder, unspecified: Secondary | ICD-10-CM | POA: Diagnosis not present

## 2021-10-11 DIAGNOSIS — F1021 Alcohol dependence, in remission: Secondary | ICD-10-CM | POA: Diagnosis not present

## 2021-10-11 NOTE — Progress Notes (Signed)
Daily Group Progress Note  Program: CD IOP   Group Time: 9 a.m. to 12 p.m.  Type of Therapy: Process and Psychoeducational   Topic: The therapist checks in with group members, assesses for SI/HI/psychosis and overall level of functioning. The therapist inquires about sobriety date and number of community support meetings attended since last session.   The therapist presents Matrix Module RP 1 Alcohol having group members discuss their answers to questions posed in the module.    Summary: Jessamyn presents for group late rating her depression as a "4" and anxiety as a "3" with no SI or HI.   She has attended four meeting since Friday; two in person and two virtual. She has still not found a person who she is interested in asking to be her Sponsor.  She says that her lawyer has requested an itemized bill from the facility.  Luciel says that the moods and feelings that make her want to drink are anxiety and "loss." She notes that she is about to lose her uncle to [place] and a friend to cancer.  In discussing activities that group members used to associate with drinking, Jendayi says that she was on a softball team in which drinking was involved.  In response to another group member's concern about his withdrawal having gotten worse over time, Kaylina shares how she got to the point that drinking first thing in the morning would not make her feel better or give her relief from withdrawal symptoms as it had done in the past.  Progress Towards Goals: Samie reports no alcohol use.   UDS collected: Yes  Results: No  AA/NA attended?: Yes  Sponsor?: No  Adam Phenix, [place], LCSW, [facility], [place] 10/11/2021

## 2021-10-13 ENCOUNTER — Encounter (HOSPITAL_COMMUNITY): Payer: Medicare Other

## 2021-10-13 ENCOUNTER — Telehealth (HOSPITAL_COMMUNITY): Payer: Self-pay | Admitting: Licensed Clinical Social Worker
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
## 2021-10-15 ENCOUNTER — Telehealth (HOSPITAL_COMMUNITY): Payer: Self-pay | Admitting: Licensed Clinical Social Worker

## 2021-10-15 ENCOUNTER — Encounter (HOSPITAL_COMMUNITY): Payer: Medicare Other
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
## 2021-10-18 ENCOUNTER — Other Ambulatory Visit (HOSPITAL_COMMUNITY): Payer: Medicare Other | Attending: Psychiatry | Admitting: Licensed Clinical Social Worker

## 2021-10-18 DIAGNOSIS — F1021 Alcohol dependence, in remission: Secondary | ICD-10-CM | POA: Diagnosis not present
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
## 2021-10-19 ENCOUNTER — Other Ambulatory Visit: Payer: Self-pay | Admitting: Physician Assistant

## 2021-10-19 DIAGNOSIS — F1020 Alcohol dependence, uncomplicated: Secondary | ICD-10-CM

## 2021-10-20 ENCOUNTER — Encounter (HOSPITAL_COMMUNITY): Payer: Medicare Other

## 2021-10-20 ENCOUNTER — Telehealth (HOSPITAL_COMMUNITY): Payer: Self-pay | Admitting: Licensed Clinical Social Worker

## 2021-10-20 NOTE — Telephone Encounter (Signed)
Terri Ayala leaves a voicemail saying that she was unable to sleep until 5 a.m. this morning so could not make group today.  She also says that she needs to talk to the PA about side effects she is having from her Wellbutrin.   Adam Phenix, [place], LCSW, [facility], [place] 10/20/2021

## 2021-10-22 ENCOUNTER — Other Ambulatory Visit (HOSPITAL_COMMUNITY): Payer: Medicare Other | Attending: Psychiatry | Admitting: Licensed Clinical Social Worker

## 2021-10-22 ENCOUNTER — Encounter (HOSPITAL_COMMUNITY): Payer: Self-pay | Admitting: Medical

## 2021-10-22 DIAGNOSIS — Z9889 Other specified postprocedural states: Secondary | ICD-10-CM

## 2021-10-22 DIAGNOSIS — Z789 Other specified health status: Secondary | ICD-10-CM

## 2021-10-22 DIAGNOSIS — Z8669 Personal history of other diseases of the nervous system and sense organs: Secondary | ICD-10-CM

## 2021-10-22 DIAGNOSIS — F1021 Alcohol dependence, in remission: Secondary | ICD-10-CM | POA: Insufficient documentation

## 2021-10-22 DIAGNOSIS — R413 Other amnesia: Secondary | ICD-10-CM | POA: Diagnosis not present

## 2021-10-22 DIAGNOSIS — T50905D Adverse effect of unspecified drugs, medicaments and biological substances, subsequent encounter: Secondary | ICD-10-CM

## 2021-10-22 DIAGNOSIS — R945 Abnormal results of liver function studies: Secondary | ICD-10-CM

## 2021-10-22 DIAGNOSIS — Z87828 Personal history of other (healed) physical injury and trauma: Secondary | ICD-10-CM | POA: Insufficient documentation

## 2021-10-22 DIAGNOSIS — F329 Major depressive disorder, single episode, unspecified: Secondary | ICD-10-CM | POA: Diagnosis not present

## 2021-10-22 DIAGNOSIS — Z62819 Personal history of unspecified abuse in childhood: Secondary | ICD-10-CM | POA: Diagnosis not present

## 2021-10-22 DIAGNOSIS — Z9189 Other specified personal risk factors, not elsewhere classified: Secondary | ICD-10-CM

## 2021-10-22 DIAGNOSIS — Z79899 Other long term (current) drug therapy: Secondary | ICD-10-CM

## 2021-10-22 DIAGNOSIS — T1490XA Injury, unspecified, initial encounter: Secondary | ICD-10-CM

## 2021-10-22 DIAGNOSIS — L719 Rosacea, unspecified: Secondary | ICD-10-CM | POA: Diagnosis not present

## 2021-10-22 DIAGNOSIS — F1029 Alcohol dependence with unspecified alcohol-induced disorder: Secondary | ICD-10-CM

## 2021-10-22 DIAGNOSIS — L718 Other rosacea: Secondary | ICD-10-CM

## 2021-10-22 DIAGNOSIS — Z8659 Personal history of other mental and behavioral disorders: Secondary | ICD-10-CM

## 2021-10-22 MED ORDER — BUPROPION HCL ER (SR) 100 MG PO TB12: 100.0000 mg | ORAL_TABLET | Freq: Two times a day (BID) | ORAL | 2 refills | Status: DC

## 2021-10-22 NOTE — Progress Notes (Signed)
Daily Group Progress Note  Program: CD IOP   Group Time: 9 a.m. to 12 p.m.  Type of Therapy: Process and Psychoeducational   Topic: The therapist checks in with group members, assesses for SI/HI/psychosis and overall level of functioning. The therapist inquires about sobriety date and number of community support meetings attended since last session.   The therapist facilitates a discussion on a range of topics including how genetics impacts whether or not a person can tolerate a particular medication and how what medications other family members are taking for the same condition or genetic testing can help zero in on the right medication for a particular person sooner.  The therapist talks about family dynamics of addiction noting that many addicts are either married to another addict or married to an [place].   He discusses that people have different ways of coping with stress in that some people are monitors and some are blunters and that there is not one-size-fits all treatment in that a therapist or Sponsor who may be a good fit for one person may be a terrible fit for another. The therapist validates that not everyone gets sober in [place] or NA; however, he does emphasize that research says that recovery takes place in Fellowship meaning that people in recovery need non-using supports and non-using hobbies and activities. The therapist explains the benefits of working the steps emphasizing that they are not designed to have a person wallow in his or her misery but to help the recovering person let go of guilty and shame and identify and remove character defects that can lead to relapse.  The therapist presents Matrix Modules RP 3A Avoiding Relapse Drift and RP 3B [place] Recovery Chart.   Summary: Sieanna presents for group rating her depression as a "3" and anxiety as a "5" with no SI or HI.   She gets her cheek swabbed today to complete the genetic testing in relation to her psychotropic  medication. She has attended two meetings and notes that meetings are her mooring lines.   When asked what things she needs to avoid, she responds, "I will have to get back to you on that one." She is still trying to locate the person who she wants to ask to be her Sponsor saying that she hopes to find her at the [employer] this weekend.  The therapist observes that Adalaya has the most amount of sobriety of anyone in group at this point as her sobriety date is in May; whereas, everyone else has sobriety dates in July or August.   Progress Towards Goals: Arista reports no alcohol use.   UDS collected: No Results: Yes; negative for alcohol   AA/NA attended?: Yes  Sponsor?: No  Adam Phenix, [place], LCSW, [facility], [place] 10/22/2021
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
## 2021-10-25 ENCOUNTER — Other Ambulatory Visit (HOSPITAL_COMMUNITY): Payer: Medicare Other | Attending: Medical | Admitting: Licensed Clinical Social Worker

## 2021-10-25 DIAGNOSIS — F1021 Alcohol dependence, in remission: Secondary | ICD-10-CM | POA: Diagnosis not present

## 2021-10-25 NOTE — Progress Notes (Signed)
Daily Group Progress Note  Program: CD IOP   Group Time: 9 a.m. to 11:50 a.m.  Type of Therapy: Process and Psychoeducational   Topic: The therapist checks in with group members, assesses for SI/HI/psychosis and overall level of functioning. The therapist inquires about sobriety date and number of community support meetings attended since last session.   The therapist covers Matrix Modules RP 4 Work and Recovery and RP 5 Guilt and Shame.  Summary: Terri Ayala presents for group rating her depression as a "4" and anxiety as a "4" with no SI or HI.   She says that she is still trying to find her previous temporary Sponsor having attended a meeting this weekend but having not see her there. She says that she is going to a meeting on Tuesday at which she hopes to find her.  She says that she meets with her lawyer tomorrow and remains frustrated at still not being able to get an itemized bill telling her how her 26 K was spent.   In discussing how retirement can negatively impact some people's drinking, Terri Ayala says that she actually went to [employer] just as she retired so was sober for some time before relapsing.  When asked what she does to occupy her time, Terri Ayala says that she does housework but is not taking long walks like she used to do noting that she is cleared to walk on her ankle and needs to start slowly.  The therapist suggests that with the much cooler weather coming at the end of the week that it would be a good time to start.   Terri Ayala leaves about 10 minute early to make a noon meeting to which another member invites her.   Progress Towards Goals: Terri Ayala reports no alcohol use.   UDS collected: Yes Results: No  AA/NA attended?: Yes  Sponsor?: No  Adam Phenix, [place], LCSW, [facility], [place] 10/25/2021

## 2021-10-27 ENCOUNTER — Encounter (HOSPITAL_COMMUNITY): Payer: Medicare Other

## 2021-10-27 ENCOUNTER — Telehealth (HOSPITAL_COMMUNITY): Payer: Self-pay | Admitting: Licensed Clinical Social Worker

## 2021-10-27 NOTE — Telephone Encounter (Signed)
Terri Ayala leaves a voicemail saying that she will not be in group as her sleep has been erratic the past few days. She met with her attorney and found out where to file a complaint noting that she wants to work on this today. She says that her sample has arrive at the lab for genetic testing.  Adam Phenix, [place], LCSW, [facility], [place] 10/27/2021

## 2021-10-28 ENCOUNTER — Encounter: Payer: Medicare Other | Admitting: Physician Assistant

## 2021-10-29 ENCOUNTER — Encounter (HOSPITAL_COMMUNITY): Payer: Medicare Other

## 2021-10-29 ENCOUNTER — Telehealth (HOSPITAL_COMMUNITY): Payer: Self-pay | Admitting: Licensed Clinical Social Worker

## 2021-10-29 NOTE — Telephone Encounter (Signed)
Terri Ayala again leaves a message saying that she will not be in group due to sleep issues and working on resolving the nursing care bill.  Adam Phenix, [place], LCSW, [facility], [place] 10/29/2021

## 2021-11-03 ENCOUNTER — Other Ambulatory Visit (HOSPITAL_COMMUNITY): Payer: Medicare Other | Attending: Psychiatry | Admitting: Licensed Clinical Social Worker

## 2021-11-03 DIAGNOSIS — F329 Major depressive disorder, single episode, unspecified: Secondary | ICD-10-CM | POA: Diagnosis not present

## 2021-11-03 DIAGNOSIS — F419 Anxiety disorder, unspecified: Secondary | ICD-10-CM | POA: Diagnosis not present

## 2021-11-03 DIAGNOSIS — F439 Reaction to severe stress, unspecified: Secondary | ICD-10-CM | POA: Diagnosis not present

## 2021-11-03 DIAGNOSIS — F1020 Alcohol dependence, uncomplicated: Secondary | ICD-10-CM | POA: Insufficient documentation

## 2021-11-03 DIAGNOSIS — F1021 Alcohol dependence, in remission: Secondary | ICD-10-CM

## 2021-11-03 NOTE — Progress Notes (Signed)
Daily Group Progress Note  Program: CD IOP   Group Time: 9:15 a.m. to 12 p.m.  Type of Therapy: Process and Psychoeducational   Topic: The therapist checks in with group members, assesses for SI/HI/psychosis and overall level of functioning. The therapist inquires about sobriety date and number of community support meetings attended since last session.  The therapist introduces two new group members having group members introduce themselves and explain how they came to be in [facility].   The therapist educates group members on [place], what a 4th Step consists of, and the importance of developing non-using hobbies and activities versus sitting idle.  The therapist covers Matrix modules RP 6 Staying Busy and RP 7 Motivation for Recovery while showing a video, "5 Ways to Stay Motivated and Reasons Why."   Summary: Terri Ayala presents for group rating her depression as a "2" and anxiety as a "3" with no SI or HI.   She admits to still having cravings to drink. She says that she found her old Publishing copy, Angelita Ingles, so now has a Medical sales representative.   She is now walking her dog to and from the mailbox. She notes that she enjoys listening to music as well.  Terri Ayala stresses that she views this time in recovery as likely being her last chance noting that she does not want to die from addiction.  In terms of today's group, Terri Ayala says that the biggest takeaway for her is the need to keep the reasons for not drinking front and center.  The therapist notes that Terri Ayala's drinking would in the short-run rid her of her feelings of stress; however, her problems would still exist the next day.   Progress Towards Goals: Terri Ayala reports no alcohol use.   UDS collected: Yes Results: No  AA/NA attended?: Yes  Sponsor?: Yes  Adam Phenix, [place], [place], [facility], [place] 11/03/2021

## 2021-11-05 ENCOUNTER — Other Ambulatory Visit (HOSPITAL_COMMUNITY): Payer: Medicare Other | Attending: Psychiatry | Admitting: Licensed Clinical Social Worker

## 2021-11-05 DIAGNOSIS — F1021 Alcohol dependence, in remission: Secondary | ICD-10-CM | POA: Diagnosis present

## 2021-11-05 NOTE — Progress Notes (Signed)
Daily Group Progress Note  Program: CD IOP   Group Time: 9 a.m. to 12 p.m.  Type of Therapy: Process and Psychoeducational   Topic: The therapist checks in with group members, assesses for SI/HI/psychosis and overall level of functioning. The therapist inquires about sobriety date and number of community support meetings attended since last session.    The therapist has the two group members who were not present on Wednesday to introduce themselves to the two new group members who started that day and vice versa.  The therapist educates the new members on the different types of Twelve Step meetings as do other group members. The therapist focuses on why marijuana use is considered no different than alcohol or any other drug use showing a video that illustrates that the distinction between a soft and hard drug is a myth. The therapist also discusses the reason that people in recovery should avoid any substance that activates the addictive pathway in the brain.   The therapist shows a group a video on the importance of honesty at the conclusion of group eliciting feedback from members.   Summary: Terri Ayala presents for group rating her depression as a "2" and anxiety as a "3" with no SI or HI.   She talks about how much better she feels now that she has found her old Publishing copy, Terri Ayala, with whom she is checking in daily.   Terri Ayala talks about struggling at times with the belief that she does not "deserve to be sober" with the therapist exploring the underlying, irrational beliefs contributing to this. Terri Ayala admits that she perhaps considers drinking as a form of self-punishment and notes that she has drunk for most of her life.  The therapist suggests that Terri Ayala deserves sobriety as much as anyone and that it is never too late in one's life to get recovery.   Progress Towards Goals: Terri Ayala reports no alcohol use.   UDS collected: Yes Results: No  AA/NA attended?: Yes  Sponsor?: Yes  Adam Phenix, [place],  [place], [facility], [place] 11/05/2021

## 2021-11-10 ENCOUNTER — Other Ambulatory Visit (HOSPITAL_COMMUNITY): Payer: Medicare Other | Attending: Psychiatry | Admitting: Licensed Clinical Social Worker

## 2021-11-10 DIAGNOSIS — F32A Depression, unspecified: Secondary | ICD-10-CM | POA: Insufficient documentation

## 2021-11-10 DIAGNOSIS — F419 Anxiety disorder, unspecified: Secondary | ICD-10-CM | POA: Diagnosis not present

## 2021-11-10 DIAGNOSIS — F1021 Alcohol dependence, in remission: Secondary | ICD-10-CM
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
## 2021-11-11 ENCOUNTER — Other Ambulatory Visit: Payer: Self-pay | Admitting: Physician Assistant

## 2021-11-12 ENCOUNTER — Other Ambulatory Visit (HOSPITAL_COMMUNITY): Payer: Medicare Other | Attending: Psychiatry | Admitting: Licensed Clinical Social Worker

## 2021-11-12 DIAGNOSIS — F1021 Alcohol dependence, in remission: Secondary | ICD-10-CM

## 2021-11-12 DIAGNOSIS — F419 Anxiety disorder, unspecified: Secondary | ICD-10-CM | POA: Diagnosis not present

## 2021-11-12 DIAGNOSIS — F32A Depression, unspecified: Secondary | ICD-10-CM | POA: Diagnosis present

## 2021-11-12 NOTE — Progress Notes (Signed)
Daily Group Progress Note  Program: CD IOP   Group Time: 9 a.m. to 12 p.m.  Type of Therapy: Process and Psychoeducational   Topic: The therapist checks in with group members, assesses for SI/HI/psychosis and overall level of functioning. The therapist inquires about sobriety date and number of community support meetings attended since last session.    The therapist facilitates a discussion regarding a number of topics including using dreams, sugar cravings in early recovery, and the need to put as much energy into one's recovery as was put into drinking and/or getting high. The therapist how to deal with toxic parents and/or family members.  The therapist continues presenting information from RP 11 Anticipating and Preventing Relapse focusing mostly on emotional buildup. The therapist has group members review the emotion wheel and talks about the reason that it is important for people in early recovery to begin to name the emotions they are feeling and to use mindfulness skills to begin paying attention to their emotional state.  Summary: Sarah-Jane presents for group rating her depression as a "3" and anxiety as a "4" with no SI or HI.   Oyinkansola talks about having had a using dream last night and about having engaged in some "stinkin thinkin" during her waking hours questioning how she could get drunk without getting sick. Norina concluded that it is not possible for her to get drunk and not get very sick based on her history.   The therapist talks to Sonakshi about her sleep as she reports normally only getting about 4-5 hours per sleep on her daily check-in sheets. She says that she will sleep about 9 hours on a weekend and the problem during the week is that she is staying up watching TV, etcetera and just needs to get to bed earlier as she always wakes up at the same time. The therapist reminds Alayshia of the video from last group emphasizing the importance of getting good sleep and eating well as this relates to  recovery.  The therapist inquires about whether Lucindy has given more thought to a hobby with Shadee says that she will likely start doing some yoga with the therapist and another group member making her aware of classes in [place]. Kalia talks about anxiety as being a big part of emotional buildup for her.   Progress Towards Goals: Soul reports no alcohol use.   UDS collected: No Results: No  AA/NA attended?: Yes  Sponsor?: Yes  Adam Phenix, [place], [place], [facility], [place] 11/12/2021

## 2021-11-15 ENCOUNTER — Telehealth (HOSPITAL_COMMUNITY): Payer: Self-pay | Admitting: Licensed Clinical Social Worker

## 2021-11-15 ENCOUNTER — Encounter (HOSPITAL_COMMUNITY): Payer: Medicare Other

## 2021-11-15 NOTE — Telephone Encounter (Signed)
Terri Ayala leaves a voicemail saying that she needs to get her sleep sorted out as she stayed up too late so ended up sleeping late such that she could not make group today.   Adam Phenix, [place], LCSW, [facility], [place] 11/15/2021

## 2021-11-17 ENCOUNTER — Other Ambulatory Visit (HOSPITAL_COMMUNITY): Payer: Medicare Other | Attending: Medical | Admitting: Licensed Clinical Social Worker

## 2021-11-17 DIAGNOSIS — R4584 Anhedonia: Secondary | ICD-10-CM | POA: Insufficient documentation

## 2021-11-17 DIAGNOSIS — Z7951 Long term (current) use of inhaled steroids: Secondary | ICD-10-CM | POA: Diagnosis not present

## 2021-11-17 DIAGNOSIS — R413 Other amnesia: Secondary | ICD-10-CM | POA: Diagnosis not present

## 2021-11-17 DIAGNOSIS — R945 Abnormal results of liver function studies: Secondary | ICD-10-CM | POA: Diagnosis not present

## 2021-11-17 DIAGNOSIS — Z9889 Other specified postprocedural states: Secondary | ICD-10-CM

## 2021-11-17 DIAGNOSIS — F1021 Alcohol dependence, in remission: Secondary | ICD-10-CM

## 2021-11-17 DIAGNOSIS — Z8659 Personal history of other mental and behavioral disorders: Secondary | ICD-10-CM

## 2021-11-17 DIAGNOSIS — R0602 Shortness of breath: Secondary | ICD-10-CM | POA: Insufficient documentation

## 2021-11-17 DIAGNOSIS — F329 Major depressive disorder, single episode, unspecified: Secondary | ICD-10-CM | POA: Diagnosis not present

## 2021-11-17 DIAGNOSIS — Z7409 Other reduced mobility: Secondary | ICD-10-CM

## 2021-11-17 DIAGNOSIS — F1029 Alcohol dependence with unspecified alcohol-induced disorder: Secondary | ICD-10-CM | POA: Diagnosis not present

## 2021-11-17 DIAGNOSIS — R062 Wheezing: Secondary | ICD-10-CM | POA: Diagnosis not present

## 2021-11-17 DIAGNOSIS — Z79899 Other long term (current) drug therapy: Secondary | ICD-10-CM | POA: Insufficient documentation

## 2021-11-17 DIAGNOSIS — Z8669 Personal history of other diseases of the nervous system and sense organs: Secondary | ICD-10-CM

## 2021-11-17 DIAGNOSIS — Z789 Other specified health status: Secondary | ICD-10-CM

## 2021-11-17 DIAGNOSIS — L719 Rosacea, unspecified: Secondary | ICD-10-CM | POA: Insufficient documentation

## 2021-11-17 DIAGNOSIS — T1490XA Injury, unspecified, initial encounter: Secondary | ICD-10-CM

## 2021-11-17 DIAGNOSIS — Z8781 Personal history of (healed) traumatic fracture: Secondary | ICD-10-CM

## 2021-11-17 DIAGNOSIS — I10 Essential (primary) hypertension: Secondary | ICD-10-CM | POA: Diagnosis not present

## 2021-11-17 DIAGNOSIS — G479 Sleep disorder, unspecified: Secondary | ICD-10-CM | POA: Insufficient documentation

## 2021-11-17 DIAGNOSIS — T50905D Adverse effect of unspecified drugs, medicaments and biological substances, subsequent encounter: Secondary | ICD-10-CM

## 2021-11-17 DIAGNOSIS — L718 Other rosacea: Secondary | ICD-10-CM

## 2021-11-17 DIAGNOSIS — Z9189 Other specified personal risk factors, not elsewhere classified: Secondary | ICD-10-CM
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
## 2021-11-17 NOTE — Progress Notes (Unsigned)
Patient ID: Terri Ayala, female   DOB: 12-01-1955, 66 y.o.   MRN: [id number]

## 2021-11-17 NOTE — Progress Notes (Unsigned)
Daily Group Progress Note  Program: CD IOP   Group Time: 9:30 a.m. to 12 p.m.  Type of Therapy: Process and Psychoeducational   Topic: The therapist checks in with group members, assesses for SI/HI/psychosis and overall level of functioning. The therapist inquires about sobriety date and number of community support meetings attended since last session.   The therapist discusses the story of Rush Landmark W's spiritual awakening and the pitfalls associated with trying to recapture a "spiritual awakening" that was the result of a drug using experience. The therapist agrees with another group member that pursuing something like meditation while sober would be a preferable path. He explains how yoga is a form of meditation.  The therapist completes Matrix Model RP 12 on Trust showing videos on betrayal trauma and another video discussing the impact of negative childhood experiences on a person's ability to trust later in life. The therapist notes that in order for a person to recovery from betrayal trauma that it involves taking the risk of engaging in a relationship as a healthy relationship is a requirement for healing. The therapist points out how AA and NA can facilitate this and educates the group about the ACES study and how adverse childhood experiences can impact people's mental and physical health later in life.     Summary: Terri Ayala presents late for group and forgets to do the daily group check-in form.   She says that she attended a meeting after which she met with her Sponsor. She will likely start working steps this weekend; however, she is trying to locate her notes from when she did step work at [employer] in 2016.  During the initial check-in portion of the group, she describes her mood as being, "happy, interested, and optimistic." She is going to start yoga classes at the [place] noting that she realizes that she could not do this if she were still drinking. She says that she  would like to get to the point that she feels "peaceful."   The therapist talks with Terri Ayala about her sleep problems which tend to occur only during weekdays. She will watch television and try to go to sleep but will not get to sleep until around 3 a.m. or so which is the reason she has either missed group or been late to group. On weekends, however, she gets to sleep before midnight and sleeps around 8 hours.  She says that she found the meditation for anxiety that the therapist showed a few meetings ago and plans on using this at night. As she tends to think about things that she needs to do when trying to get to sleep during weekdays, the therapist suggests that Terri Ayala keep a pen and notepad by her bed to write them down so she does not feel the need to keep repeating them in her head.   During the discussion on betrayal trauma, Terri Ayala acknowledges along with another group member that she has experienced this in the past and that it is difficult to get over.   Progress Towards Goals: Terri Ayala reports no alcohol use.   UDS collected: Yes Results: No  AA/NA attended?: Yes  Sponsor?: Yes  Terri Ayala, [place], [place], [facility], [place] 11/17/2021

## 2021-11-18 ENCOUNTER — Encounter (HOSPITAL_COMMUNITY): Payer: Self-pay | Admitting: Licensed Clinical Social Worker

## 2021-11-19 ENCOUNTER — Other Ambulatory Visit (HOSPITAL_COMMUNITY): Payer: Medicare Other | Attending: Medical | Admitting: Licensed Clinical Social Worker

## 2021-11-19 DIAGNOSIS — F419 Anxiety disorder, unspecified: Secondary | ICD-10-CM | POA: Diagnosis present

## 2021-11-19 DIAGNOSIS — F32A Depression, unspecified: Secondary | ICD-10-CM | POA: Insufficient documentation

## 2021-11-19 DIAGNOSIS — F1021 Alcohol dependence, in remission: Secondary | ICD-10-CM

## 2021-11-19 NOTE — Progress Notes (Signed)
Daily Group Progress Note  Program: CD IOP   Group Time: 9 a.m. to 12 p.m.  Type of Therapy: Process and Psychoeducational   Topic: The therapist checks in with group members, assesses for SI/HI/psychosis and overall level of functioning. The therapist inquires about sobriety date and number of community support meetings attended since last session.   The therapist has group members introduce themselves and talk about how they came to be in IOP as there are two number members in group today.  The therapist facilitates discussions on how to find and Sponsor, how grief typically leads to irrational guilt, and on the fact that even drugs without severe withdrawal can be addiction based on changes to the brain and neurotransmitters in the body which lead to cravings to use. The therapist answers group member's questions about various drugs.  The therapist reads the Just for Today NA reading on having gratitude for one's recovery viewing it as a gift which needed to be care for. The therapist notes that most addicts never receive treatment and that even some with treatment end up dying from the disease.    Summary: Terri Ayala presents for group rating her depression as a "2" and her anxiety as a "3." She describes her mood as being "hopeful" and "thankful." She shares her story and notes that she decided that she did not want to die or have cirrhosis of the liver.   She is quiet but attentive throughout most of group. At the conclusion of the group she says that the biggest takeaway that she has is that she is "not alone" in her recovery as everyone in group may have gotten here differently but are all struggling with the same thing.   Progress Towards Goals: Terri Ayala reports no alcohol use.   UDS collected: No Results: No  AA/NA attended?: Yes  Sponsor?: Yes  Adam Phenix, [place], [place], [facility], [place] 11/19/2021

## 2021-11-22 ENCOUNTER — Telehealth (HOSPITAL_COMMUNITY): Payer: Self-pay | Admitting: Licensed Clinical Social Worker

## 2021-11-22 ENCOUNTER — Other Ambulatory Visit (HOSPITAL_COMMUNITY): Payer: Self-pay | Admitting: Medical

## 2021-11-22 ENCOUNTER — Other Ambulatory Visit: Payer: Self-pay | Admitting: Physician Assistant

## 2021-11-22 NOTE — Telephone Encounter (Signed)
Terri Ayala leaves a voicemail saying that she is having some sort of outpatient surgery on her eye in the morning on 11/22/21 so will likely not be in group.   Adam Phenix, [place], LCSW, [facility], [place] 11/22/2021

## 2021-11-24 ENCOUNTER — Encounter (HOSPITAL_COMMUNITY): Payer: Medicare Other

## 2021-11-24 ENCOUNTER — Telehealth (HOSPITAL_COMMUNITY): Payer: Self-pay | Admitting: Licensed Clinical Social Worker

## 2021-11-24 NOTE — Telephone Encounter (Signed)
Terri Ayala leaves a voicemail saying that she woke up with a headache although she got 7 hours of sleep. She believes that the headache may be related to the surgical procedure that she had on Monday saying that she has migraines to is going to take some Imitrex and lie down and will likely not be in group today.  Adam Phenix, [name], LCSW, [facility], [place] 11/24/2021

## 2021-11-25 ENCOUNTER — Other Ambulatory Visit (HOSPITAL_COMMUNITY): Payer: Self-pay | Admitting: Medical

## 2021-11-25 NOTE — Telephone Encounter (Signed)
Terri Ayala leaves a voicemail saying that she woke up with a headache although she got 7 hours of sleep. She believes that the headache may be related to the surgical procedure that she had on Monday saying that she has migraines to is going to take some Imitrex and lie down and will likely not be in group today.   Adam Phenix, [place], LCSW, [facility], [place] 11/24/2021

## 2021-11-25 NOTE — Telephone Encounter (Signed)
Pt in treatment CD IOP

## 2021-11-26 ENCOUNTER — Emergency Department (HOSPITAL_COMMUNITY)
Admission: EM | Admit: 2021-11-26 | Discharge: 2021-11-27 | Discharge status: Against Medical Advice | Payer: Medicare Other | Source: Home / Self Care

## 2021-11-26 ENCOUNTER — Other Ambulatory Visit (HOSPITAL_COMMUNITY): Payer: Medicare Other | Attending: Psychiatry | Admitting: Licensed Clinical Social Worker

## 2021-11-26 ENCOUNTER — Emergency Department (HOSPITAL_COMMUNITY): Payer: Medicare Other

## 2021-11-26 ENCOUNTER — Other Ambulatory Visit: Payer: Self-pay

## 2021-11-26 DIAGNOSIS — W19XXXA Unspecified fall, initial encounter: Secondary | ICD-10-CM | POA: Insufficient documentation

## 2021-11-26 DIAGNOSIS — W1830XA Fall on same level, unspecified, initial encounter: Secondary | ICD-10-CM | POA: Insufficient documentation

## 2021-11-26 DIAGNOSIS — R42 Dizziness and giddiness: Secondary | ICD-10-CM | POA: Insufficient documentation

## 2021-11-26 DIAGNOSIS — Z5329 Procedure and treatment not carried out because of patient's decision for other reasons: Secondary | ICD-10-CM | POA: Diagnosis not present

## 2021-11-26 DIAGNOSIS — S0003XA Contusion of scalp, initial encounter: Secondary | ICD-10-CM | POA: Insufficient documentation

## 2021-11-26 DIAGNOSIS — M47812 Spondylosis without myelopathy or radiculopathy, cervical region: Secondary | ICD-10-CM | POA: Insufficient documentation

## 2021-11-26 DIAGNOSIS — Z5321 Procedure and treatment not carried out due to patient leaving prior to being seen by health care provider: Secondary | ICD-10-CM | POA: Insufficient documentation

## 2021-11-26 DIAGNOSIS — Y9389 Activity, other specified: Secondary | ICD-10-CM | POA: Insufficient documentation

## 2021-11-26 DIAGNOSIS — I6782 Cerebral ischemia: Secondary | ICD-10-CM | POA: Diagnosis not present

## 2021-11-26 DIAGNOSIS — F1021 Alcohol dependence, in remission: Secondary | ICD-10-CM

## 2021-11-26 DIAGNOSIS — Y92512 Supermarket, store or market as the place of occurrence of the external cause: Secondary | ICD-10-CM | POA: Insufficient documentation

## 2021-11-26 DIAGNOSIS — E042 Nontoxic multinodular goiter: Secondary | ICD-10-CM | POA: Diagnosis not present

## 2021-11-26 DIAGNOSIS — S0083XA Contusion of other part of head, initial encounter: Secondary | ICD-10-CM | POA: Insufficient documentation

## 2021-11-26 DIAGNOSIS — I6529 Occlusion and stenosis of unspecified carotid artery: Secondary | ICD-10-CM | POA: Diagnosis not present

## 2021-11-26 DIAGNOSIS — M542 Cervicalgia: Secondary | ICD-10-CM | POA: Insufficient documentation

## 2021-11-26 DIAGNOSIS — G319 Degenerative disease of nervous system, unspecified: Secondary | ICD-10-CM | POA: Insufficient documentation

## 2021-11-26 NOTE — ED Triage Notes (Addendum)
Pt here after falling while at the grocery store. Pt states she was trying to reach something on a shelf, stepped on her L foot "weird" and fell onto bottom, and slide backwards, hitting head on shelf. Pt not on thinners, has hematoma to posterior head. Pt c/o headache and neck pain. C-collar applied in triage. Pt denies LOC, states after hitting her head she was dizzy.

## 2021-11-26 NOTE — ED Provider Triage Note (Signed)
Emergency Medicine Provider Triage Evaluation Note  Terri Ayala , a 66 y.o. female  was evaluated in triage.  Pt complains of head injury and neck pain.  History of alcoholism.  Patient fell at the grocery store, fell onto her bottom slammed her head on the floor.  She is a hematoma to the occiput occipital region and complains of midline neck pain.  She denies any numbness or tingling.  She "saw stars" but did not was consciousness.  Review of Systems  Positive: Fall and head injury Negative: LOC  Physical Exam  BP (!) 147/90   Pulse (!) 103   Temp 98.5 F (36.9 C) (Oral)   Resp 16   SpO2 100%  Gen:   Awake, no distress   Resp:  Normal effort  MSK:   Moves extremities without difficulty  Other:  Midline spinal tenderness, hematoma to the occiput  Medical Decision Making  Medically screening exam initiated at 3:41 PM.  Appropriate orders placed.  [name] was informed that the remainder of the evaluation will be completed by another provider, this initial triage assessment does not replace that evaluation, and the importance of remaining in the ED until their evaluation is complete.  Patient placed in c-collar, work-up initiated   [name], PA-C 11/26/21 1543

## 2021-11-26 NOTE — Progress Notes (Signed)
Daily Group Progress Note  Program: CD IOP   Group Time: 9:20 a.m. to 12 p.m.  Type of Therapy: Process and Psychoeducational   Topic: The therapist checks in with group members, assesses for SI/HI/psychosis and overall level of functioning. The therapist inquires about sobriety date and number of community support meetings attended since last session.   The therapist discusses the importance of assertiveness in relation to self-care which is critical to recovery. The therapist presents Matrix Module RP 14 Defining Spirituality. As group member struggle to come up with specific versus subjective answers to the questions in the module, the therapist provides them the questions as homework to work on over the weekend. He has them add whether or not they have a spiritual practice that gives them hope and meaning and purpose and if so, how often do they do this practice.   The therapist shows a video on Three Ways Spirituality Supports Addiction Recovery.   Summary: Terri Ayala presents for group late rating her depression as a "2" and her anxiety as a "3."   She describes her mood as "thankful" and "hopeful." She has increased her meeting attendance saying that she has done four meetings this past week.   Terri Ayala says that she started working steps with her Sponsor but has not found her notes from her old step work but believes they are in her addict. She says that she is not dwelling as much on the emotions that she used to feel when drinking and did start yoga about which she is excited.  In terms of defining success, Terri Ayala says that at one point that she considered this to be completing her Master's degree which she was never able to do.   Progress Towards Goals: Terri Ayala reports no alcohol use.   UDS collected: No Results: Yes, negative for ETOH  AA/NA attended?: Yes  Sponsor?: Yes  Adam Phenix, [place], [place], [facility], [place] 11/26/2021

## 2021-11-26 NOTE — ED Notes (Signed)
C-collar applied

## 2021-11-27 NOTE — ED Notes (Signed)
PATIENT LEFT AMA 

## 2021-11-29 ENCOUNTER — Encounter (HOSPITAL_COMMUNITY): Payer: Medicare Other

## 2021-11-29 ENCOUNTER — Other Ambulatory Visit (HOSPITAL_COMMUNITY): Payer: Self-pay | Admitting: Medical

## 2021-11-29 ENCOUNTER — Encounter: Payer: Medicare Other | Admitting: Physician Assistant

## 2021-12-01 ENCOUNTER — Other Ambulatory Visit (HOSPITAL_COMMUNITY): Payer: Medicare Other | Attending: Psychiatry | Admitting: Licensed Clinical Social Worker

## 2021-12-01 ENCOUNTER — Other Ambulatory Visit (HOSPITAL_COMMUNITY): Payer: Self-pay | Admitting: Medical

## 2021-12-01 DIAGNOSIS — F32A Depression, unspecified: Secondary | ICD-10-CM | POA: Insufficient documentation

## 2021-12-01 DIAGNOSIS — F419 Anxiety disorder, unspecified: Secondary | ICD-10-CM | POA: Insufficient documentation

## 2021-12-01 DIAGNOSIS — F1021 Alcohol dependence, in remission: Secondary | ICD-10-CM

## 2021-12-01 MED ORDER — BACLOFEN 10 MG PO TABS: 10.0000 mg | ORAL_TABLET | Freq: Three times a day (TID) | ORAL | 0 refills | Status: DC

## 2021-12-01 NOTE — Progress Notes (Signed)
Daily Group Progress Note  Program: CD IOP   Group Time: 9 a.m. to 12 p.m.  Type of Therapy: Process and Psychoeducational   Topic: The therapist checks in with group members, assesses for SI/HI/psychosis and overall level of functioning. The therapist inquires about sobriety date and number of community support meetings attended since last session.   The therapist has group members complete and discuss the questions in RP 14 Defining Spirituality as the major of group members did not complete this homework.   Summary: Terri Ayala presents for group late rating her depression as a "2" and her anxiety as a "3."   She describes her mood as "optimistic" and "interested." In talking about the importance of self-care and sleep, Terri Ayala notes that she was taking a nap in the afternoon that helped a lot and that her sleep schedule is getting better regulated.  She recently attended a yoga class for seniors which she says was more manageable than her first class. She also ordered a yoga DVD. She attended a meeting last night. She says that being busier helps to keep her mind off drinking and notes that her cravings are "not as bad."   In completing the homework on spirituality, Terri Ayala says that she still needs to define her Higher Power. The therapist notes that based on her answers that she may want to consider getting back into some sort of volunteering as she did in the past during her longest past sobriety as it seems to give her a sense of purpose and allows her to have more needed social contact.   Progress Towards Goals: Terri Ayala reports no alcohol use.   UDS collected: Yes Results: No  AA/NA attended?: Yes  Sponsor?: Yes  Adam Phenix, [place], [place], [facility], [place] 12/01/2021

## 2021-12-03 ENCOUNTER — Telehealth (HOSPITAL_COMMUNITY): Payer: Self-pay | Admitting: Licensed Clinical Social Worker

## 2021-12-03 ENCOUNTER — Encounter (HOSPITAL_COMMUNITY): Payer: Medicare Other | Admitting: Licensed Clinical Social Worker

## 2021-12-03 NOTE — Telephone Encounter (Signed)
Pamella leaves a voicemail saying that she is going to miss group as she lay back down for 10-15 minutes and slept through her 2nd alarm.  Adam Phenix, [place], LCSW, [facility], [place] 12/03/2201

## 2021-12-06 ENCOUNTER — Telehealth (HOSPITAL_COMMUNITY): Payer: Self-pay | Admitting: Licensed Clinical Social Worker

## 2021-12-06 NOTE — Telephone Encounter (Signed)
Terri Ayala leaves a voicemail saying that she will not be in group today as she has a Dermatology appointment today at 10 a.m. that she forgot about.  Adam Phenix, [place], LCSW, [facility], [place] 12/06/2021

## 2021-12-07 ENCOUNTER — Encounter: Payer: Self-pay | Admitting: Physician Assistant

## 2021-12-07 ENCOUNTER — Ambulatory Visit (INDEPENDENT_AMBULATORY_CARE_PROVIDER_SITE_OTHER): Payer: Medicare Other | Admitting: Physician Assistant

## 2021-12-07 VITALS — BP 138/87 | HR 87 | Temp 97.4°F | Ht 62.0 in | Wt 134.0 lb

## 2021-12-07 DIAGNOSIS — E7800 Pure hypercholesterolemia, unspecified: Secondary | ICD-10-CM

## 2021-12-07 DIAGNOSIS — I10 Essential (primary) hypertension: Secondary | ICD-10-CM | POA: Diagnosis not present

## 2021-12-07 DIAGNOSIS — Z780 Asymptomatic menopausal state: Secondary | ICD-10-CM

## 2021-12-07 DIAGNOSIS — G43009 Migraine without aura, not intractable, without status migrainosus: Secondary | ICD-10-CM | POA: Diagnosis not present

## 2021-12-07 DIAGNOSIS — Z1159 Encounter for screening for other viral diseases: Secondary | ICD-10-CM

## 2021-12-07 DIAGNOSIS — Z0000 Encounter for general adult medical examination without abnormal findings: Secondary | ICD-10-CM

## 2021-12-07 DIAGNOSIS — M8580 Other specified disorders of bone density and structure, unspecified site: Secondary | ICD-10-CM

## 2021-12-07 DIAGNOSIS — E041 Nontoxic single thyroid nodule: Secondary | ICD-10-CM | POA: Diagnosis not present

## 2021-12-07 MED ORDER — SUMATRIPTAN SUCCINATE 100 MG PO TABS: 100.0000 mg | ORAL_TABLET | ORAL | 1 refills | Status: DC | PRN

## 2021-12-07 MED ORDER — CARVEDILOL 6.25 MG PO TABS: 6.2500 mg | ORAL_TABLET | Freq: Two times a day (BID) | ORAL | 1 refills | Status: DC

## 2021-12-07 NOTE — Telephone Encounter (Signed)
See telephone contact note from 12/06/21.  Adam Phenix, MA, LCSW, [facility], [facility]

## 2021-12-07 NOTE — Progress Notes (Addendum)
Subjective:   Terri Ayala is a 66 y.o. female who presents for Medicare Annual (Subsequent) preventive examination.  Review of Systems    General:   No F/C, wt loss Pulm:   No DIB, SOB, pleuritic chest pain Card:  No CP, palpitations Abd:  No n/v/d or pain Ext:  No edema     Objective:    Today's Vitals   12/07/21 1316 12/07/21 1404  BP: (!) 151/85 138/87  Pulse: 87   Temp: (!) 97.4 F (36.3 C)   TempSrc: Temporal   Weight: 134 lb (60.8 kg)   Height: '5\' 2"'$  (1.575 m)    Body mass index is 24.51 kg/m.     07/21/2021   11:32 AM 06/09/2021    7:00 PM 06/09/2021    6:56 PM 06/08/2021    2:31 PM 02/12/2021   12:17 AM 02/11/2021    4:34 AM 09/26/2020    5:00 PM  Advanced Directives  Does Patient Have a Medical Advance Directive? Yes   Yes Yes No No  Type of Paramedic of [place];Living will [place];Living will [place];Living will [place];Living will [place]    Does patient want to make changes to medical advance directive? No - Patient declined    No - Patient declined    Copy of [place] in Chart? No - copy requested No - copy requested No - copy requested      Would patient like information on creating a medical advance directive?       No - Patient declined    Current Medications (verified) Outpatient Encounter Medications as of 12/07/2021  Medication Sig   albuterol (VENTOLIN HFA) 108 (90 Base) MCG/ACT inhaler INHALE 2 PUFFS INTO THE LUNGS EVERY 6 HOURS AS NEEDED FOR WHEEZING OR SHORTNESS OF BREATH.   alendronate (FOSAMAX) 70 MG tablet Take 70 mg by mouth every Monday.   ampicillin (PRINCIPEN) 500 MG capsule Take 500 mg by mouth daily.   Azelaic Acid 15 % gel    baclofen (LIORESAL) 10 MG tablet Take 1 tablet (10 mg total) by mouth 3 (three) times daily.   buPROPion ER (WELLBUTRIN SR) 100 MG 12 hr tablet Take 1 tablet (100 mg total) by mouth 2  (two) times daily.   busPIRone (BUSPAR) 15 MG tablet Take 15 mg by mouth 3 (three) times daily.   Calcium Carbonate-Vitamin D (CALCIUM 600+D PO) Take 1 tablet by mouth daily.   carvedilol (COREG) 6.25 MG tablet Take 1 tablet (6.25 mg total) by mouth 2 (two) times daily with a meal.   clindamycin (CLINDAGEL) 1 % gel    Dermatological Products, Misc. (DERMAREST ROSACEA EX) Apply topically Once daily for 30   diclofenac Sodium (VOLTAREN) 1 % GEL Apply 2 g 4 times a day by topical route.   fluocinonide (LIDEX) 0.05 % external solution    fluticasone (FLONASE) 50 MCG/ACT nasal spray Place 2 sprays into both nostrils daily as needed for allergies.   folic acid (FOLVITE) 1 MG tablet Take 1 tablet by mouth daily.   folic acid (FOLVITE) 1 MG tablet TAKE 1 TABLET BY MOUTH DAILY.   gabapentin (NEURONTIN) 100 MG capsule Take 2 capsules (200 mg total) by mouth 3 (three) times daily.   gabapentin (NEURONTIN) 300 MG capsule Take 1 capsule twice a day by oral route.   hydrocortisone 2.5 % cream hydrocortisone 2.5 % topical cream  APPLY TO AFFECTED AREA TWICE  A DAY FOR 14 DAYS   hydrOXYzine (ATARAX/VISTARIL) 25 MG tablet Take 25 mg by mouth 3 (three) times daily.   influenza vac split quadrivalent PF (AFLURIA QUADRIVALENT) 0.5 ML injection    ketoconazole (NIZORAL) 2 % shampoo APPLY 5 MLS TO AFFECTED AREA 2 TO 3 TIMES A WEEK for 30   meloxicam (MOBIC) 15 MG tablet Take 1 tablet (15 mg total) by mouth daily as needed for pain.   Multiple Vitamins-Minerals (ADULT GUMMY PO) Take 2 capsules by mouth daily.   nitrofurantoin, macrocrystal-monohydrate, (MACROBID) 100 MG capsule Take 1 capsule twice a day by oral route for 7 days.   norethindrone-ethinyl estradiol (FEMHRT 1/5) 1-5 MG-MCG TABS tablet Take 1 tablet by mouth daily.   ondansetron (ZOFRAN-ODT) 8 MG disintegrating tablet    pantoprazole (PROTONIX) 40 MG tablet TAKE 1 TABLET BY MOUTH 2 TIMES DAILY.   pneumococcal 13-valent conjugate vaccine (PREVNAR 13)  SUSP injection PHARMACY ADMINISTERED   SUMAtriptan (IMITREX) 100 MG tablet Take 1 tablet (100 mg total) by mouth every 2 (two) hours as needed for migraine. May repeat in 2 hours if headache persists or recurs.   thiamine (VITAMIN B-1) 100 MG tablet Take 1 tablet every day by oral route.   traZODone (DESYREL) 50 MG tablet Take 50 mg by mouth at bedtime.   triamcinolone cream (KENALOG) 0.1 % APPLY TO AFFECTED AREA TWICE A DAY   Zoster Vaccine Adjuvanted [facility]) injection    Zoster Vaccine Live, PF, (ZOSTAVAX) 31517 UNT/0.65ML injection    [DISCONTINUED] carvedilol (COREG) 3.125 MG tablet TAKE 1 TABLET BY MOUTH 2 TIMES A DAY FOR HYPERTENSION   [DISCONTINUED] SUMAtriptan (IMITREX) 100 MG tablet Take 1 tablet every day by oral route as needed.   [DISCONTINUED] SUMAtriptan (IMITREX) 50 MG tablet TAKE 1 TABLET BY MOUTH AS NEEDED FOR MIGRAINE HEADACHE   acetaminophen (TYLENOL) 500 MG tablet Take 500 mg by mouth every 8 (eight) hours as needed. (Patient not taking: Reported on 12/07/2021)   buPROPion ER (WELLBUTRIN SR) 100 MG 12 hr tablet Take 1 tablet (100 mg total) by mouth 2 (two) times daily for 60 doses.   lidocaine (LIDODERM) 5 % Apply 1 patch every 12 hours by topical route. (Patient not taking: Reported on 12/07/2021)   metroNIDAZOLE (FLAGYL) 500 MG tablet  (Patient not taking: Reported on 12/07/2021)   No facility-administered encounter medications on file as of 12/07/2021.    Allergies (verified) Doxycycline, Codeine, and Tetracycline hcl   History: Past Medical History:  Diagnosis Date   Alcohol withdrawal ([place]) 11/2017; 01/04/2018   Alcoholism ([place])    Anxiety    Arthritis    "hands" (01/04/2018)   ASYMPTOMATIC POSTMENOPAUSAL STATUS 08/17/2009   Chronic lower back pain    Depression    DYSPHAGIA 06/19/2007   Fatty liver, alcoholic    GERD (gastroesophageal reflux disease)    GOITER, MULTINODULAR 08/17/2009   Hyperlipidemia    Hypertension    Migraines    "not sure what  triggers them; I'll have 1 q couple weeks or more; come 2 days in a row when they come" (01/04/2018)   Mitral valve prolapse    OSTEOPOROSIS 08/17/2009   PONV (postoperative nausea and vomiting)    Supraventricular tachycardia    Past Surgical History:  Procedure Laterality Date   BREAST BIOPSY Right    "benign"   CATARACT EXTRACTION W/ INTRAOCULAR LENS  IMPLANT, BILATERAL     CERVICAL CONE BIOPSY  2000s   CERVIX LESION DESTRUCTION  1991   "dysplasia; lesions"  CHOLECYSTECTOMY N/A 03/14/2013   Procedure: LAPAROSCOPIC CHOLECYSTECTOMY WITH ATTEMPTED INTRAOPERATIVE CHOLANGIOGRAM;  Surgeon: Harl Bowie, MD;  Location: [place];  Service: General;  Laterality: N/A;   HARDWARE REMOVAL Left 07/22/2021   Procedure: HARDWARE REMOVAL;  Surgeon: Altamese [place], MD;  Location: [facility];  Service: Orthopedics;  Laterality: Left;   laporoscopic abdominal surgery     "for endometriosis"   LEFT HEART CATH AND CORONARY ANGIOGRAPHY N/A 05/07/2018   Procedure: LEFT HEART CATH AND CORONARY ANGIOGRAPHY;  Surgeon: Belva Crome, MD;  Location: [facility];  Service: Cardiovascular;  Laterality: N/A;   ORIF ANKLE FRACTURE Left 02/11/2021   Procedure: OPEN REDUCTION INTERNAL FIXATION (ORIF) ANKLE FRACTURE;  Surgeon: Altamese [place] Alian[place], MD;  Location: [facility];  Service: Orthopedics;  Laterality: Left;   TONSILLECTOMY     Family History  Problem Relation Age of Onset   Colon cancer Father        Deceased, 60   Osteoporosis Mother        Living, 61   Healthy Brother    Healthy Brother    Healthy Son    Healthy Son    Thyroid disease Neg Hx    Goiter Neg Hx    Social History   Socioeconomic History   Marital status: Single    Spouse name: Not on file   Number of children: Not on file   Years of education: Not on file   Highest education level: Not on file  Occupational History   Occupation: Brewing technologist - retired    Fish farm manager: [employer]  Tobacco Use   Smoking status: Former     Packs/day: 1.00    Years: 7.00    Total pack years: 7.00    Types: Cigarettes    Quit date: 02/28/1974    Years since quitting: 47.8   Smokeless tobacco: Never   Tobacco comments:    01/04/2018 "smoked when I was a teenager"  Vaping Use   Vaping Use: Never used  Substance and Sexual Activity   Alcohol use: Not Currently    Alcohol/week: 28.0 standard drinks of alcohol    Types: 28 Glasses of wine per week    Comment: I had Detox on 07/13/21 and no alcohol since then   Drug use: Never   Sexual activity: Not Currently  Other Topics Concern   Not on file  Social History Narrative   Lives alone.  She has a BS biology.   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   Social Determinants of Health   Financial Resource Strain: Not on file  Food Insecurity: Not on file  Transportation Needs: Not on file  Physical Activity: Not on file  Stress: Not on file  Social Connections: Not on file    Tobacco Counseling Counseling given: Not Answered Tobacco comments: 01/04/2018 "smoked when I was a teenager"   Clinical Intake:                 Diabetic?no         Activities of Daily Living    12/07/2021    1:30 PM 07/30/2021   10:49 AM  In your present state of health, do you have any difficulty performing the following activities:  Hearing? 1 1  Vision? 0 1  Difficulty concentrating or making decisions? 0 0  Walking or climbing stairs? 0 1  Dressing or bathing? 0 1  Doing errands, shopping? 0 1    Patient Care Team: Mariel Kansky,  Samantha Crimes as PCP - General (Physician Assistant) Burnell Blanks, MD as PCP - Cardiology (Cardiology) Bobbye Charleston, MD as Consulting Physician (Obstetrics and Gynecology) Juanita Craver, MD as Consulting Physician (Gastroenterology)  Indicate any recent Medical Services you may have received from other than [facility] providers in the past year (date may be approximate).     Assessment:   This is a routine  wellness examination for [place].  Hearing/Vision screen No results found.  Dietary issues and exercise activities discussed:  -Discussed a heart healthy diet low in fat and carbohydrates. Encourage to continue alcohol cessation. Continue with yoga.   Goals Addressed   None   Depression Screen    12/07/2021    1:29 PM 09/03/2021   12:42 PM 07/30/2021   10:48 AM 07/19/2021    2:11 PM 05/17/2021    3:04 PM 05/05/2021    2:31 PM 12/07/2020    2:11 PM  PHQ 2/9 Scores  PHQ - 2 Score 0  0  0 2 1  PHQ- 9 Score '1  3  1 8 3     '$ Information is confidential and restricted. Go to Review Flowsheets to unlock data.    Fall Risk    12/07/2021    1:29 PM 05/17/2021    3:03 PM 12/07/2020    2:11 PM 10/19/2020    9:14 AM 04/15/2020    1:02 PM  [place] in the past year? 1 1 0 0 0  Number falls in past yr: 1 0 0 0   Injury with Fall? 0 1 0 0   Risk for fall due to : History of fall(s) History of fall(s);Impaired balance/gait;Impaired mobility No Fall Risks History of fall(s)   Follow up Falls evaluation completed Falls evaluation completed Falls evaluation completed Falls evaluation completed Falls evaluation completed    [place]:  Any stairs in or around the home? Yes  If so, are there any without handrails? Yes  Home free of loose throw rugs in walkways, pet beds, electrical cords, etc? No  Adequate lighting in your home to reduce risk of falls? Yes   ASSISTIVE DEVICES UTILIZED TO PREVENT FALLS:  Life alert? Yes  Use of a cane, walker or w/c? No  Grab bars in the bathroom? Yes  Shower chair or bench in shower? Yes  Elevated toilet seat or a handicapped toilet? No   TIMED UP AND GO:  Was the test performed? Yes .  Length of time to ambulate 10 feet: 10 sec.   Gait steady and fast without use of assistive device  Cognitive Function: wnl's        12/07/2021    1:43 PM 01/14/2020    1:14 PM  6CIT Screen  What Year? 0 points 0 points   What month? 0 points 0 points  What time? 0 points 0 points  Count back from 20 0 points 0 points  Months in reverse 0 points 0 points  Repeat phrase 2 points 0 points  Total Score 2 points 0 points    Immunizations Immunization History  Administered Date(s) Administered   Fluad Quad(high Dose 65+) 12/07/2020   Influenza Split 11/22/2011, 12/05/2013, 12/18/2014   Influenza,inj,Quad PF,6+ Mos 03/16/2013, 01/30/2016, 10/12/2017, 11/14/2018   Influenza-Unspecified 12/06/2014, 12/30/2015, 12/06/2021   Moderna Sars-Covid-2 Vaccination 04/05/2019, 05/03/2019, 12/20/2019, 06/05/2020, 12/19/2020   Pfizer Covid-19 Vaccine Bivalent Booster 76yr & up 12/06/2021   Pneumococcal Conjugate-13 02/10/2016, 11/19/2018   Pneumococcal Polysaccharide-23 04/04/2014, 11/09/2017  Td 12/18/2006   Tdap 09/07/2016   Zoster Recombinat (Shingrix) 11/18/2016, 03/22/2017    TDAP status: Up to date  Flu Vaccine status: Up to date  Pneumococcal vaccine status: Up to date  Covid-19 vaccine status: Completed vaccines  Qualifies for Shingles Vaccine? Yes   Zostavax completed Yes   Shingrix Completed?: Yes  Screening Tests Health Maintenance  Topic Date Due   MAMMOGRAM  02/28/2019   COVID-19 Vaccine (7 - Moderna risk series) 01/31/2022   Pneumonia Vaccine 22+ Years old (3 - PPSV23 or PCV20) 11/10/2022   COLONOSCOPY (Pts 45-82yr Insurance coverage will need to be confirmed)  03/12/2025   TETANUS/TDAP  09/08/2026   INFLUENZA VACCINE  Completed   DEXA SCAN  Completed   Hepatitis C Screening  Completed   Zoster Vaccines- Shingrix  Completed   HPV VACCINES  Aged Out    Health Maintenance  Health Maintenance Due  Topic Date Due   MAMMOGRAM  02/28/2019    Colorectal cancer screening: Type of screening: Colonoscopy. Completed 03/13/2015. Repeat every 10 years  Mammogram status: Completed 02/27/2017. Repeat every year [facility]  Bone  Density status: Completed 04/17/2019. Results reflect: Bone density results: OSTEOPENIA. Repeat every 2 years.  Lung Cancer Screening: (Low Dose CT Chest recommended if Age 67-80years, 30 pack-year currently smoking OR have quit w/in 15years.) does not qualify.   Lung Cancer Screening Referral: n/a  Additional Screening:  Hepatitis C Screening: does qualify; Completed 12/07/2021  Vision Screening: Recommended annual ophthalmology exams for early detection of glaucoma and other disorders of the eye. Is the patient up to date with their annual eye exam?  Yes  Who is the provider or what is the name of the office in which the patient attends annual eye exams? Dr. BValetta CloseIf pt is not established with a provider, would they like to be referred to a provider to establish care? No .   Dental Screening: Recommended annual dental exams for proper oral hygiene  Community Resource Referral / Chronic Care Management: CRR required this visit?  No   CCM required this visit?  No      Plan:  -Pt is fasting so will collect routine fasting labs. Pt agreeable to Hep C screening. -Will request mammogram report from OB/GYN. -Will place order for bone density. -BP elevated, repeated with some improvement. Pt reports ambulatory BP has been consistently in the 140s/80-90s so will increase carvedilol to 6.25 mg BID. Advised to let me know if BP continues to remain >140/90. -Provided rx for Sumatriptan 100 mg, reports has to take 2 tablets of 50 mg to resolve acute headache.  -Continue with BH therapy. -Follow-up in 3 months for reg OV- HTN, mood, migraine  I have personally reviewed and noted the following in the patient's chart:   Medical and social history Use of alcohol, tobacco or illicit drugs  Current medications and supplements including opioid prescriptions. Patient is not currently taking opioid prescriptions. Functional ability and status Nutritional status Physical activity Advanced  directives List of other physicians Hospitalizations, surgeries, and ER visits in previous 12 months Vitals Screenings to include cognitive, depression, and falls Referrals and appointments  In addition, I have reviewed and discussed with patient certain preventive protocols, quality metrics, and best practice recommendations. A written personalized care plan for preventive services as well as general preventive health recommendations were provided to patient.   Addendum: unable to collect labs today, advised to return later this week for  fasting labs.  Lorrene Reid, PA-C

## 2021-12-07 NOTE — Patient Instructions (Signed)
Preventive Care 65 Years and Older, Female Preventive care refers to lifestyle choices and visits with your health care provider that can promote health and wellness. Preventive care visits are also called wellness exams. What can I expect for my preventive care visit? Counseling Your health care provider may ask you questions about your: Medical history, including: Past medical problems. Family medical history. Pregnancy and menstrual history. History of falls. Current health, including: Memory and ability to understand (cognition). Emotional well-being. Home life and relationship well-being. Sexual activity and sexual health. Lifestyle, including: Alcohol, nicotine or tobacco, and drug use. Access to firearms. Diet, exercise, and sleep habits. Work and work environment. Sunscreen use. Safety issues such as seatbelt and bike helmet use. Physical exam Your health care provider will check your: Height and weight. These may be used to calculate your BMI (body mass index). BMI is a measurement that tells if you are at a healthy weight. Waist circumference. This measures the distance around your waistline. This measurement also tells if you are at a healthy weight and may help predict your risk of certain diseases, such as type 2 diabetes and high blood pressure. Heart rate and blood pressure. Body temperature. Skin for abnormal spots. What immunizations do I need?  Vaccines are usually given at various ages, according to a schedule. Your health care provider will recommend vaccines for you based on your age, medical history, and lifestyle or other factors, such as travel or where you work. What tests do I need? Screening Your health care provider may recommend screening tests for certain conditions. This may include: Lipid and cholesterol levels. Hepatitis C test. Hepatitis B test. HIV (human immunodeficiency virus) test. STI (sexually transmitted infection) testing, if you are at  risk. Lung cancer screening. Colorectal cancer screening. Diabetes screening. This is done by checking your blood sugar (glucose) after you have not eaten for a while (fasting). Mammogram. Talk with your health care provider about how often you should have regular mammograms. BRCA-related cancer screening. This may be done if you have a family history of breast, ovarian, tubal, or peritoneal cancers. Bone density scan. This is done to screen for osteoporosis. Talk with your health care provider about your test results, treatment options, and if necessary, the need for more tests. Follow these instructions at home: Eating and drinking  Eat a diet that includes fresh fruits and vegetables, whole grains, lean protein, and low-fat dairy products. Limit your intake of foods with high amounts of sugar, saturated fats, and salt. Take vitamin and mineral supplements as recommended by your health care provider. Do not drink alcohol if your health care provider tells you not to drink. If you drink alcohol: Limit how much you have to 0-1 drink a day. Know how much alcohol is in your drink. In the [place] one drink equals one 12 oz bottle of beer (355 mL), one 5 oz glass of wine (148 mL), or one 1 oz glass of hard liquor (44 mL). Lifestyle Brush your teeth every morning and night with fluoride toothpaste. Floss one time each day. Exercise for at least 30 minutes 5 or more days each week. Do not use any products that contain nicotine or tobacco. These products include cigarettes, chewing tobacco, and vaping devices, such as e-cigarettes. If you need help quitting, ask your health care provider. Do not use drugs. If you are sexually active, practice safe sex. Use a condom or other form of protection in order to prevent STIs. Take aspirin only as told by   your health care provider. Make sure that you understand how much to take and what form to take. Work with your health care provider to find out whether it  is safe and beneficial for you to take aspirin daily. Ask your health care provider if you need to take a cholesterol-lowering medicine (statin). Find healthy ways to manage stress, such as: Meditation, yoga, or listening to music. Journaling. Talking to a trusted person. Spending time with friends and family. Minimize exposure to UV radiation to reduce your risk of skin cancer. Safety Always wear your seat belt while driving or riding in a vehicle. Do not drive: If you have been drinking alcohol. Do not ride with someone who has been drinking. When you are tired or distracted. While texting. If you have been using any mind-altering substances or drugs. Wear a helmet and other protective equipment during sports activities. If you have firearms in your house, make sure you follow all gun safety procedures. What's next? Visit your health care provider once a year for an annual wellness visit. Ask your health care provider how often you should have your eyes and teeth checked. Stay up to date on all vaccines. This information is not intended to replace advice given to you by your health care provider. Make sure you discuss any questions you have with your health care provider. Document Revised: 08/12/2020 Document Reviewed: 08/12/2020 Elsevier Patient Education  2023 [employer]  

## 2021-12-08 ENCOUNTER — Encounter (HOSPITAL_COMMUNITY): Payer: Self-pay | Admitting: Licensed Clinical Social Worker

## 2021-12-08 ENCOUNTER — Other Ambulatory Visit (HOSPITAL_COMMUNITY): Payer: Medicare Other | Attending: Medical | Admitting: Licensed Clinical Social Worker

## 2021-12-08 DIAGNOSIS — L718 Other rosacea: Secondary | ICD-10-CM

## 2021-12-08 DIAGNOSIS — F1021 Alcohol dependence, in remission: Secondary | ICD-10-CM | POA: Diagnosis present

## 2021-12-08 DIAGNOSIS — R413 Other amnesia: Secondary | ICD-10-CM

## 2021-12-08 DIAGNOSIS — T1490XA Injury, unspecified, initial encounter: Secondary | ICD-10-CM

## 2021-12-08 DIAGNOSIS — R945 Abnormal results of liver function studies: Secondary | ICD-10-CM

## 2021-12-08 DIAGNOSIS — Z789 Other specified health status: Secondary | ICD-10-CM

## 2021-12-08 DIAGNOSIS — Z9889 Other specified postprocedural states: Secondary | ICD-10-CM

## 2021-12-08 DIAGNOSIS — IMO0001 Reserved for inherently not codable concepts without codable children: Secondary | ICD-10-CM

## 2021-12-08 DIAGNOSIS — Z8669 Personal history of other diseases of the nervous system and sense organs: Secondary | ICD-10-CM

## 2021-12-08 DIAGNOSIS — E8889 Other specified metabolic disorders: Secondary | ICD-10-CM

## 2021-12-08 DIAGNOSIS — Z62819 Personal history of unspecified abuse in childhood: Secondary | ICD-10-CM

## 2021-12-08 DIAGNOSIS — Z79899 Other long term (current) drug therapy: Secondary | ICD-10-CM

## 2021-12-08 DIAGNOSIS — F1029 Alcohol dependence with unspecified alcohol-induced disorder: Secondary | ICD-10-CM

## 2021-12-08 DIAGNOSIS — Z9189 Other specified personal risk factors, not elsewhere classified: Secondary | ICD-10-CM

## 2021-12-08 DIAGNOSIS — Z8659 Personal history of other mental and behavioral disorders: Secondary | ICD-10-CM
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
## 2021-12-08 NOTE — Progress Notes (Signed)
Daily Group Progress Note  Program: CD IOP   Group Time: 9 a.m. to 12 p.m.  Type of Therapy: Process and Psychoeducational   Topic: The therapist checks in with group members, assesses for SI/HI/psychosis and overall level of functioning. The therapist inquires about sobriety date and number of community support meetings attended since last session.   The therapist shows a video on five tips for staying sober which include daily meeting attendance, talking to someone in recovery daily, praying and meditating daily, reading four pages of spiritual material, and doing a daily gratitude list. The therapist facilitates a discussion on the group's impression of these suggestions and whether they are doing any. He assigns the homework of group members doing the prayer/meditation, spiritual reading, and gratitude list daily for one week.  He discusses the concept of radical acceptance and how this applies to recovery.   Summary: Terri Ayala presents for group late rating her depression as a "2" and her anxiety as a "3."   She says that her mood is "anxious" and "stressed" mainly due to being so busy with meetings, yoga, and doctors appointments. She says that she is having to tend to things that she neglected when drinking.   Terri Ayala is going to start Step 2 with her Sponsor noting that she still has not defined her Higher Power but says that she believed in God when she was younger so may use the God of her youth as her Higher Power.  She takes notes on the five things to stay sober noting that she is going to keep the list near her coffee maker so she will remember to do them.   Progress Towards Goals: Terri Ayala reports no alcohol use.   UDS collected: No Results: Yes, negative for alcohol  AA/NA attended?: Yes  Sponsor?: Yes  Adam Phenix, [place], [place], [facility], [place] 12/08/2021

## 2021-12-09 ENCOUNTER — Other Ambulatory Visit: Payer: Medicare Other

## 2021-12-10 ENCOUNTER — Telehealth (HOSPITAL_COMMUNITY): Payer: Self-pay | Admitting: Licensed Clinical Social Worker

## 2021-12-10 ENCOUNTER — Encounter (HOSPITAL_COMMUNITY): Payer: Medicare Other

## 2021-12-10 NOTE — Telephone Encounter (Signed)
Terri Ayala leaves a message saying that she once again fell back to sleep after her first alarm such that she will not be in group today.   [address] [place], LCSW, [facility], LCAS 12/10/2021

## 2021-12-13 ENCOUNTER — Encounter (HOSPITAL_COMMUNITY): Payer: Medicare Other | Admitting: Licensed Clinical Social Worker

## 2021-12-13 ENCOUNTER — Telehealth (HOSPITAL_COMMUNITY): Payer: Self-pay | Admitting: Licensed Clinical Social Worker

## 2021-12-13 NOTE — Telephone Encounter (Signed)
Terri Ayala calls saying that she did not come to group as her "body needs the sleep" and she believes that she's "done" as she has her Twelve Step meetings, her Sponsor, and yoga.  She says that she will try to be in group Wednesday, 12/15/21, which will likely be her last day.  [address], [place], LCSW, [facility], LCAS 12/13/2021

## 2021-12-14 ENCOUNTER — Other Ambulatory Visit: Payer: Medicare Other

## 2021-12-15 ENCOUNTER — Other Ambulatory Visit: Payer: Medicare Other

## 2021-12-15 ENCOUNTER — Ambulatory Visit (HOSPITAL_COMMUNITY): Payer: Medicare Other | Admitting: Medical

## 2021-12-15 ENCOUNTER — Telehealth (HOSPITAL_COMMUNITY): Payer: Self-pay | Admitting: Licensed Clinical Social Worker
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
## 2021-12-15 NOTE — Telephone Encounter (Signed)
The therapist leaves a HIPAA-compliant voicemail for Uma with his direct callback number.  [address] [place], LCSW, [facility], LCAS 12/15/2021

## 2021-12-16 ENCOUNTER — Telehealth (HOSPITAL_COMMUNITY): Payer: Self-pay | Admitting: Licensed Clinical Social Worker

## 2021-12-16 LAB — CBC WITH DIFFERENTIAL/PLATELET
Basophils Absolute: 0.1 10*3/uL (ref 0.0–0.2)
Basos: 1 %
EOS (ABSOLUTE): 0.2 10*3/uL (ref 0.0–0.4)
Eos: 3 %
Hematocrit: 40.9 % (ref 34.0–46.6)
Hemoglobin: 13.6 g/dL (ref 11.1–15.9)
Immature Grans (Abs): 0 10*3/uL (ref 0.0–0.1)
Immature Granulocytes: 0 %
Lymphocytes Absolute: 1.9 10*3/uL (ref 0.7–3.1)
Lymphs: 28 %
MCH: 31.1 pg (ref 26.6–33.0)
MCHC: 33.3 g/dL (ref 31.5–35.7)
MCV: 94 fL (ref 79–97)
Monocytes Absolute: 0.5 10*3/uL (ref 0.1–0.9)
Monocytes: 7 %
Neutrophils Absolute: 4.2 10*3/uL (ref 1.4–7.0)
Neutrophils: 61 %
Platelets: 298 10*3/uL (ref 150–450)
RBC: 4.37 x10E6/uL (ref 3.77–5.28)
RDW: 12.4 % (ref 11.7–15.4)
WBC: 6.8 10*3/uL (ref 3.4–10.8)

## 2021-12-16 LAB — COMPREHENSIVE METABOLIC PANEL
ALT: 8 IU/L (ref 0–32)
AST: 15 IU/L (ref 0–40)
Albumin/Globulin Ratio: 2.3 — ABNORMAL HIGH (ref 1.2–2.2)
Albumin: 4.6 g/dL (ref 3.9–4.9)
Alkaline Phosphatase: 55 IU/L (ref 44–121)
BUN/Creatinine Ratio: 10 — ABNORMAL LOW (ref 12–28)
BUN: 10 mg/dL (ref 8–27)
Bilirubin Total: 0.3 mg/dL (ref 0.0–1.2)
CO2: 24 mmol/L (ref 20–29)
Calcium: 9.5 mg/dL (ref 8.7–10.3)
Chloride: 104 mmol/L (ref 96–106)
Creatinine, Ser: 0.98 mg/dL (ref 0.57–1.00)
Globulin, Total: 2 g/dL (ref 1.5–4.5)
Glucose: 91 mg/dL (ref 70–99)
Potassium: 4.4 mmol/L (ref 3.5–5.2)
Sodium: 143 mmol/L (ref 134–144)
Total Protein: 6.6 g/dL (ref 6.0–8.5)
eGFR: 63 mL/min/{1.73_m2} (ref >59)

## 2021-12-16 LAB — LIPID PANEL
Chol/HDL Ratio: 4.9 ratio — ABNORMAL HIGH (ref 0.0–4.4)
Cholesterol, Total: 234 mg/dL — ABNORMAL HIGH (ref 100–199)
HDL: 48 mg/dL (ref >39)
LDL Chol Calc (NIH): 168 mg/dL — ABNORMAL HIGH (ref 0–99)
Triglycerides: 103 mg/dL (ref 0–149)
VLDL Cholesterol Cal: 18 mg/dL (ref 5–40)

## 2021-12-16 LAB — HEPATITIS C ANTIBODY: Hep C Virus Ab: NONREACTIVE

## 2021-12-16 LAB — TSH: TSH: 2.58 u[IU]/mL (ref 0.450–4.500)

## 2021-12-16 NOTE — Telephone Encounter (Signed)
Mercer leaves a voicemail saying that she planned on attending group on Wednesday but again overslept. She says that she wants to attend Friday and is interested in setting up aftercare appointments to continue medication management with the P.A., Mr. Darlyne Russian.  [address], [place], LCSW, [facility], LCAS 12/16/2021

## 2021-12-17 ENCOUNTER — Other Ambulatory Visit (HOSPITAL_COMMUNITY): Payer: Self-pay | Admitting: Medical

## 2021-12-17 ENCOUNTER — Encounter (HOSPITAL_COMMUNITY): Payer: Self-pay | Admitting: Medical

## 2021-12-17 ENCOUNTER — Other Ambulatory Visit (HOSPITAL_COMMUNITY): Payer: Medicare Other | Attending: Psychiatry | Admitting: Licensed Clinical Social Worker

## 2021-12-17 DIAGNOSIS — Z9889 Other specified postprocedural states: Secondary | ICD-10-CM | POA: Diagnosis not present

## 2021-12-17 DIAGNOSIS — Z8781 Personal history of (healed) traumatic fracture: Secondary | ICD-10-CM | POA: Insufficient documentation

## 2021-12-17 DIAGNOSIS — F4312 Post-traumatic stress disorder, chronic: Secondary | ICD-10-CM | POA: Diagnosis not present

## 2021-12-17 DIAGNOSIS — F1029 Alcohol dependence with unspecified alcohol-induced disorder: Secondary | ICD-10-CM | POA: Insufficient documentation

## 2021-12-17 DIAGNOSIS — F1020 Alcohol dependence, uncomplicated: Secondary | ICD-10-CM | POA: Insufficient documentation

## 2021-12-17 DIAGNOSIS — F32A Depression, unspecified: Secondary | ICD-10-CM | POA: Diagnosis not present

## 2021-12-17 DIAGNOSIS — Z7409 Other reduced mobility: Secondary | ICD-10-CM | POA: Diagnosis not present

## 2021-12-17 DIAGNOSIS — R945 Abnormal results of liver function studies: Secondary | ICD-10-CM | POA: Diagnosis not present

## 2021-12-17 DIAGNOSIS — F419 Anxiety disorder, unspecified: Secondary | ICD-10-CM | POA: Diagnosis not present

## 2021-12-17 DIAGNOSIS — F1021 Alcohol dependence, in remission: Secondary | ICD-10-CM

## 2021-12-17 DIAGNOSIS — Z62819 Personal history of unspecified abuse in childhood: Secondary | ICD-10-CM | POA: Diagnosis not present

## 2021-12-17 DIAGNOSIS — Z9189 Other specified personal risk factors, not elsewhere classified: Secondary | ICD-10-CM | POA: Insufficient documentation

## 2021-12-17 MED ORDER — BACLOFEN 10 MG PO TABS: 10.0000 mg | ORAL_TABLET | Freq: Three times a day (TID) | ORAL | 0 refills | Status: DC

## 2021-12-17 MED ORDER — BUSPIRONE HCL 15 MG PO TABS: 15.0000 mg | ORAL_TABLET | Freq: Three times a day (TID) | ORAL | 2 refills | Status: DC

## 2021-12-17 MED ORDER — BUPROPION HCL ER (SR) 100 MG PO TB12: 100.0000 mg | ORAL_TABLET | Freq: Two times a day (BID) | ORAL | 2 refills | Status: DC

## 2021-12-17 NOTE — Progress Notes (Signed)
Daily Group Progress Note  Program: CD IOP   Group Time: 9 a.m. to 12 p.m.  Type of Therapy: Process and Psychoeducational   Topic: The therapist checks in with group members, assesses for SI/HI/psychosis and overall level of functioning. The therapist inquires about sobriety date and number of community support meetings attended since last session.   The therapist covers Matrix Modules RP 15 on Managing Life Managing Money and RP 16 on Relapse Justification.   Summary: Terri Ayala presents for her last group rating her depression and anxiety both as a "2."   She says that her mood is "content" and "thankful." She admits that she did not think she was going to make it this time telling the group that if she can make it, then anyone can make it.  She continues to meet with her Sponsor and is on Step 2. She says that she recently got a yoga mat and videos. She participates in today's discussion on the Matrix Modules.  Terri Ayala says that she will follow-up with the P.A. from IOP again in three months and agrees to contact this therapist on a p.r.n. basis. She says that she no longer has the home health people as she does not need anyone to pick her up of help her manage things.   Progress Towards Goals: Terri Ayala reports no alcohol use.   UDS collected: No Results: No  AA/NA attended?: Yes  Sponsor?: Yes  Adam Phenix, [place], [place], [facility], [place] 12/17/2021

## 2021-12-20 ENCOUNTER — Telehealth: Payer: Self-pay

## 2021-12-20 ENCOUNTER — Encounter (HOSPITAL_COMMUNITY): Payer: Medicare Other

## 2021-12-20 NOTE — Telephone Encounter (Signed)
Pt is calling with concerns of the elevated Cholesterol pt is wanting to know if she can start the Stain drug before rechecking labs due to the hx of it being high and the family hx.  Please advise

## 2021-12-21 NOTE — Telephone Encounter (Signed)
Unable to contact patient left voicemail to contact office.

## 2021-12-22 ENCOUNTER — Encounter (HOSPITAL_COMMUNITY): Payer: Medicare Other

## 2021-12-24 ENCOUNTER — Encounter (HOSPITAL_COMMUNITY): Payer: Medicare Other

## 2021-12-24 LAB — HM COLONOSCOPY

## 2021-12-27 ENCOUNTER — Encounter (HOSPITAL_COMMUNITY): Payer: Medicare Other

## 2021-12-29 ENCOUNTER — Encounter: Payer: Self-pay | Admitting: Physician Assistant

## 2021-12-29 ENCOUNTER — Encounter (HOSPITAL_COMMUNITY): Payer: Medicare Other

## 2022-01-05 ENCOUNTER — Other Ambulatory Visit: Payer: Self-pay | Admitting: Physician Assistant

## 2022-01-05 DIAGNOSIS — F1020 Alcohol dependence, uncomplicated: Secondary | ICD-10-CM

## 2022-01-25 ENCOUNTER — Inpatient Hospital Stay (HOSPITAL_COMMUNITY)
Admission: EM | Admit: 2022-01-25 | Discharge: 2022-01-29 | DRG: 091 | Disposition: A | Discharge status: Home / Self Care | Payer: Medicare Other | Attending: Internal Medicine | Admitting: Internal Medicine

## 2022-01-25 ENCOUNTER — Emergency Department (HOSPITAL_COMMUNITY): Payer: Medicare Other

## 2022-01-25 ENCOUNTER — Encounter (HOSPITAL_COMMUNITY): Payer: Self-pay | Admitting: Internal Medicine

## 2022-01-25 DIAGNOSIS — M810 Age-related osteoporosis without current pathological fracture: Secondary | ICD-10-CM | POA: Diagnosis present

## 2022-01-25 DIAGNOSIS — F1010 Alcohol abuse, uncomplicated: Secondary | ICD-10-CM | POA: Diagnosis not present

## 2022-01-25 DIAGNOSIS — K700 Alcoholic fatty liver: Secondary | ICD-10-CM | POA: Diagnosis present

## 2022-01-25 DIAGNOSIS — Z885 Allergy status to narcotic agent status: Secondary | ICD-10-CM

## 2022-01-25 DIAGNOSIS — Z8262 Family history of osteoporosis: Secondary | ICD-10-CM | POA: Diagnosis not present

## 2022-01-25 DIAGNOSIS — E876 Hypokalemia: Secondary | ICD-10-CM | POA: Diagnosis present

## 2022-01-25 DIAGNOSIS — Z800 Family history of malignant neoplasm of digestive organs: Secondary | ICD-10-CM | POA: Diagnosis not present

## 2022-01-25 DIAGNOSIS — R451 Restlessness and agitation: Principal | ICD-10-CM

## 2022-01-25 DIAGNOSIS — F411 Generalized anxiety disorder: Secondary | ICD-10-CM | POA: Diagnosis present

## 2022-01-25 DIAGNOSIS — K219 Gastro-esophageal reflux disease without esophagitis: Secondary | ICD-10-CM | POA: Diagnosis present

## 2022-01-25 DIAGNOSIS — T43295A Adverse effect of other antidepressants, initial encounter: Secondary | ICD-10-CM | POA: Diagnosis present

## 2022-01-25 DIAGNOSIS — F331 Major depressive disorder, recurrent, moderate: Secondary | ICD-10-CM | POA: Diagnosis present

## 2022-01-25 DIAGNOSIS — F10139 Alcohol abuse with withdrawal, unspecified: Secondary | ICD-10-CM | POA: Diagnosis present

## 2022-01-25 DIAGNOSIS — I341 Nonrheumatic mitral (valve) prolapse: Secondary | ICD-10-CM | POA: Diagnosis present

## 2022-01-25 DIAGNOSIS — F32A Depression, unspecified: Secondary | ICD-10-CM | POA: Diagnosis not present

## 2022-01-25 DIAGNOSIS — Z881 Allergy status to other antibiotic agents status: Secondary | ICD-10-CM

## 2022-01-25 DIAGNOSIS — T43595A Adverse effect of other antipsychotics and neuroleptics, initial encounter: Secondary | ICD-10-CM | POA: Diagnosis present

## 2022-01-25 DIAGNOSIS — E860 Dehydration: Secondary | ICD-10-CM | POA: Diagnosis present

## 2022-01-25 DIAGNOSIS — Z87891 Personal history of nicotine dependence: Secondary | ICD-10-CM

## 2022-01-25 DIAGNOSIS — R6510 Systemic inflammatory response syndrome (SIRS) of non-infectious origin without acute organ dysfunction: Secondary | ICD-10-CM | POA: Diagnosis present

## 2022-01-25 DIAGNOSIS — I10 Essential (primary) hypertension: Secondary | ICD-10-CM | POA: Diagnosis not present

## 2022-01-25 DIAGNOSIS — F19988 Other psychoactive substance use, unspecified with other psychoactive substance-induced disorder: Secondary | ICD-10-CM | POA: Diagnosis present

## 2022-01-25 DIAGNOSIS — E785 Hyperlipidemia, unspecified: Secondary | ICD-10-CM | POA: Diagnosis present

## 2022-01-25 DIAGNOSIS — G9340 Encephalopathy, unspecified: Secondary | ICD-10-CM | POA: Diagnosis present

## 2022-01-25 DIAGNOSIS — T428X5A Adverse effect of antiparkinsonism drugs and other central muscle-tone depressants, initial encounter: Secondary | ICD-10-CM | POA: Diagnosis present

## 2022-01-25 DIAGNOSIS — Z79899 Other long term (current) drug therapy: Secondary | ICD-10-CM

## 2022-01-25 DIAGNOSIS — G8929 Other chronic pain: Secondary | ICD-10-CM | POA: Diagnosis present

## 2022-01-25 DIAGNOSIS — G9341 Metabolic encephalopathy: Secondary | ICD-10-CM | POA: Diagnosis present

## 2022-01-25 DIAGNOSIS — T43215A Adverse effect of selective serotonin and norepinephrine reuptake inhibitors, initial encounter: Secondary | ICD-10-CM | POA: Diagnosis present

## 2022-01-25 DIAGNOSIS — M545 Low back pain, unspecified: Secondary | ICD-10-CM | POA: Diagnosis present

## 2022-01-25 DIAGNOSIS — G928 Other toxic encephalopathy: Secondary | ICD-10-CM | POA: Diagnosis present

## 2022-01-25 DIAGNOSIS — Z781 Physical restraint status: Secondary | ICD-10-CM

## 2022-01-25 DIAGNOSIS — N179 Acute kidney failure, unspecified: Secondary | ICD-10-CM | POA: Diagnosis present

## 2022-01-25 DIAGNOSIS — T426X5A Adverse effect of other antiepileptic and sedative-hypnotic drugs, initial encounter: Secondary | ICD-10-CM | POA: Diagnosis present

## 2022-01-25 DIAGNOSIS — Z7983 Long term (current) use of bisphosphonates: Secondary | ICD-10-CM

## 2022-01-25 DIAGNOSIS — Z9049 Acquired absence of other specified parts of digestive tract: Secondary | ICD-10-CM

## 2022-01-25 DIAGNOSIS — F1020 Alcohol dependence, uncomplicated: Secondary | ICD-10-CM | POA: Diagnosis present

## 2022-01-25 LAB — RAPID URINE DRUG SCREEN, HOSP PERFORMED
Amphetamines: NOT DETECTED
Barbiturates: NOT DETECTED
Benzodiazepines: NOT DETECTED
Cocaine: NOT DETECTED
Opiates: NOT DETECTED
Tetrahydrocannabinol: POSITIVE — AB

## 2022-01-25 LAB — URINALYSIS, ROUTINE W REFLEX MICROSCOPIC
Bacteria, UA: NONE SEEN
Bilirubin Urine: NEGATIVE
Glucose, UA: NEGATIVE mg/dL
Hgb urine dipstick: NEGATIVE
Ketones, ur: 20 mg/dL — AB
Leukocytes,Ua: NEGATIVE
Nitrite: NEGATIVE
Protein, ur: 100 mg/dL — AB
Specific Gravity, Urine: 1.025 (ref 1.005–1.030)
pH: 7 (ref 5.0–8.0)

## 2022-01-25 LAB — COMPREHENSIVE METABOLIC PANEL
ALT: 17 U/L (ref 0–44)
AST: 24 U/L (ref 15–41)
Albumin: 4.4 g/dL (ref 3.5–5.0)
Alkaline Phosphatase: 36 U/L — ABNORMAL LOW (ref 38–126)
Anion gap: 17 — ABNORMAL HIGH (ref 5–15)
BUN: 14 mg/dL (ref 8–23)
CO2: 22 mmol/L (ref 22–32)
Calcium: 10 mg/dL (ref 8.9–10.3)
Chloride: 102 mmol/L (ref 98–111)
Creatinine, Ser: 1.1 mg/dL — ABNORMAL HIGH (ref 0.44–1.00)
GFR, Estimated: 55 mL/min — ABNORMAL LOW (ref >60)
Glucose, Bld: 112 mg/dL — ABNORMAL HIGH (ref 70–99)
Potassium: 3.8 mmol/L (ref 3.5–5.1)
Sodium: 141 mmol/L (ref 135–145)
Total Bilirubin: 0.9 mg/dL (ref 0.3–1.2)
Total Protein: 7.4 g/dL (ref 6.5–8.1)

## 2022-01-25 LAB — CBC WITH DIFFERENTIAL/PLATELET
Abs Immature Granulocytes: 0.05 10*3/uL (ref 0.00–0.07)
Basophils Absolute: 0.1 10*3/uL (ref 0.0–0.1)
Basophils Relative: 1 %
Eosinophils Absolute: 0.1 10*3/uL (ref 0.0–0.5)
Eosinophils Relative: 1 %
HCT: 39.8 % (ref 36.0–46.0)
Hemoglobin: 13.7 g/dL (ref 12.0–15.0)
Immature Granulocytes: 0 %
Lymphocytes Relative: 15 %
Lymphs Abs: 1.8 10*3/uL (ref 0.7–4.0)
MCH: 31.8 pg (ref 26.0–34.0)
MCHC: 34.4 g/dL (ref 30.0–36.0)
MCV: 92.3 fL (ref 80.0–100.0)
Monocytes Absolute: 0.9 10*3/uL (ref 0.1–1.0)
Monocytes Relative: 7 %
Neutro Abs: 9.1 10*3/uL — ABNORMAL HIGH (ref 1.7–7.7)
Neutrophils Relative %: 76 %
Platelets: 314 10*3/uL (ref 150–400)
RBC: 4.31 MIL/uL (ref 3.87–5.11)
RDW: 13.2 % (ref 11.5–15.5)
WBC: 12 10*3/uL — ABNORMAL HIGH (ref 4.0–10.5)
nRBC: 0 % (ref 0.0–0.2)

## 2022-01-25 LAB — SALICYLATE LEVEL: Salicylate Lvl: <7 mg/dL — ABNORMAL LOW (ref 7.0–30.0)

## 2022-01-25 LAB — ETHANOL: Alcohol, Ethyl (B): <10 mg/dL (ref <10)

## 2022-01-25 LAB — ACETAMINOPHEN LEVEL: Acetaminophen (Tylenol), Serum: <10 ug/mL — ABNORMAL LOW (ref 10–30)

## 2022-01-25 MED ORDER — LORAZEPAM 2 MG/ML IJ SOLN: 0.0000 mg | Freq: Two times a day (BID) | INTRAMUSCULAR | Status: DC

## 2022-01-25 MED ORDER — LORAZEPAM 1 MG PO TABS: 1.0000 mg | ORAL_TABLET | Freq: Once | ORAL | Status: DC

## 2022-01-25 MED ORDER — LORAZEPAM 1 MG PO TABS
1.0000 mg | ORAL_TABLET | ORAL | Status: AC | PRN
  Administered 2022-01-27: 1 mg via ORAL
  Filled 2022-01-25: qty 1

## 2022-01-25 MED ORDER — ACETAMINOPHEN 650 MG RE SUPP: 650.0000 mg | Freq: Four times a day (QID) | RECTAL | Status: DC | PRN

## 2022-01-25 MED ORDER — STERILE WATER FOR INJECTION IJ SOLN
INTRAMUSCULAR | Status: AC
  Administered 2022-01-25: 10 mL
  Filled 2022-01-25: qty 10

## 2022-01-25 MED ORDER — THIAMINE MONONITRATE 100 MG PO TABS
100.0000 mg | ORAL_TABLET | Freq: Every day | ORAL | Status: DC
  Administered 2022-01-27 – 2022-01-29 (×3): 100 mg via ORAL
  Filled 2022-01-25 (×3): qty 1

## 2022-01-25 MED ORDER — LORAZEPAM 2 MG/ML IJ SOLN
1.0000 mg | Freq: Once | INTRAMUSCULAR | Status: AC
  Administered 2022-01-25: 1 mg via INTRAMUSCULAR
  Filled 2022-01-25: qty 1

## 2022-01-25 MED ORDER — LORAZEPAM 1 MG PO TABS
0.0000 mg | ORAL_TABLET | Freq: Four times a day (QID) | ORAL | Status: DC
  Administered 2022-01-25: 2 mg via ORAL
  Filled 2022-01-25: qty 2

## 2022-01-25 MED ORDER — LORAZEPAM 2 MG/ML IJ SOLN
0.0000 mg | Freq: Four times a day (QID) | INTRAMUSCULAR | Status: DC
  Filled 2022-01-25: qty 1

## 2022-01-25 MED ORDER — PHENOBARBITAL 20 MG/5ML PO ELIX
60.0000 mg | ORAL_SOLUTION | Freq: Once | ORAL | Status: AC
  Administered 2022-01-25: 60 mg via ORAL
  Filled 2022-01-25 (×2): qty 15

## 2022-01-25 MED ORDER — THIAMINE HCL 100 MG/ML IJ SOLN: 100.0000 mg | Freq: Every day | INTRAMUSCULAR | Status: DC

## 2022-01-25 MED ORDER — FOLIC ACID 1 MG PO TABS
1.0000 mg | ORAL_TABLET | Freq: Every day | ORAL | Status: DC
  Administered 2022-01-27 – 2022-01-29 (×3): 1 mg via ORAL
  Filled 2022-01-25 (×3): qty 1

## 2022-01-25 MED ORDER — LORAZEPAM 2 MG/ML IJ SOLN
2.0000 mg | Freq: Once | INTRAMUSCULAR | Status: AC
  Administered 2022-01-25: 2 mg via INTRAMUSCULAR
  Filled 2022-01-25: qty 1

## 2022-01-25 MED ORDER — LORAZEPAM 1 MG PO TABS
2.0000 mg | ORAL_TABLET | Freq: Once | ORAL | Status: AC
  Administered 2022-01-25: 2 mg via ORAL
  Filled 2022-01-25: qty 2

## 2022-01-25 MED ORDER — ENOXAPARIN SODIUM 40 MG/0.4ML IJ SOSY
40.0000 mg | PREFILLED_SYRINGE | INTRAMUSCULAR | Status: DC
  Administered 2022-01-27 – 2022-01-29 (×3): 40 mg via SUBCUTANEOUS
  Filled 2022-01-25 (×4): qty 0.4

## 2022-01-25 MED ORDER — LORAZEPAM 1 MG PO TABS: 0.0000 mg | ORAL_TABLET | Freq: Two times a day (BID) | ORAL | Status: DC

## 2022-01-25 MED ORDER — LORAZEPAM 2 MG/ML IJ SOLN: 1.0000 mg | Freq: Once | INTRAMUSCULAR | Status: DC

## 2022-01-25 MED ORDER — ZIPRASIDONE MESYLATE 20 MG IM SOLR
20.0000 mg | Freq: Once | INTRAMUSCULAR | Status: AC
  Administered 2022-01-25: 20 mg via INTRAMUSCULAR
  Filled 2022-01-25: qty 20

## 2022-01-25 MED ORDER — ADULT MULTIVITAMIN W/MINERALS CH
1.0000 | ORAL_TABLET | Freq: Every day | ORAL | Status: DC
  Administered 2022-01-27 – 2022-01-29 (×3): 1 via ORAL
  Filled 2022-01-25 (×3): qty 1

## 2022-01-25 MED ORDER — ZIPRASIDONE MESYLATE 20 MG IM SOLR
10.0000 mg | Freq: Once | INTRAMUSCULAR | Status: AC | PRN
  Administered 2022-01-26: 10 mg via INTRAMUSCULAR
  Filled 2022-01-25: qty 20

## 2022-01-25 MED ORDER — LORAZEPAM 1 MG PO TABS
1.0000 mg | ORAL_TABLET | Freq: Once | ORAL | Status: AC
  Administered 2022-01-25: 1 mg via ORAL
  Filled 2022-01-25: qty 1

## 2022-01-25 MED ORDER — ACETAMINOPHEN 325 MG PO TABS: 650.0000 mg | ORAL_TABLET | Freq: Four times a day (QID) | ORAL | Status: DC | PRN

## 2022-01-25 MED ORDER — LORAZEPAM 2 MG/ML IJ SOLN
1.0000 mg | INTRAMUSCULAR | Status: AC | PRN
  Administered 2022-01-26 – 2022-01-27 (×7): 2 mg via INTRAVENOUS
  Administered 2022-01-27: 3 mg via INTRAVENOUS
  Administered 2022-01-28: 2 mg via INTRAVENOUS
  Filled 2022-01-25: qty 2
  Filled 2022-01-25 (×8): qty 1

## 2022-01-25 NOTE — ED Provider Triage Note (Signed)
Emergency Medicine Provider Triage Evaluation Note  ANDIE MUNGIN , a 66 y.o. female  was evaluated in triage.  Pt complains of intense anxiety after using some "bad cannabis" tonight.  She ingested it this evening shortly prior to EMS arrival.  Per EMS unable to obtain vital signs due to patient's agitation, patient trying to jump from the ambulance, pulling off her seatbelt with the ambulance.  States that this happened before to her when using marijuana.  Patient difficult to redirect..  Review of Systems  Positive: As above Negative: Unable to complete ROS due to patient's agitation  Physical Exam  BP (!) 142/96 (BP Location: Left Arm)   Pulse (!) 108   Temp 97.8 F (36.6 C) (Oral)   Resp 20   SpO2 100%  Gen:   Awake, appears extremely anxious, aimlessly walking about the room in the triage area Resp:  Normal effort  MSK:   Moves extremities without difficulty  Other:  Mild tachycardia with regular rhythm lungs CTA B.  Medical Decision Making  Medically screening exam initiated at 2:48 AM.  Appropriate orders placed.  [name] was informed that the remainder of the evaluation will be completed by another provider, this initial triage assessment does not replace that evaluation, and the importance of remaining in the ED until their evaluation is complete.  Patient very difficult to redirect, ingestion labs ordered, Ativan ordered.  This chart was dictated using voice recognition software, Dragon. Despite the best efforts of this provider to proofread and correct errors, errors may still occur which can change documentation meaning.    Emeline Darling, PA-C 01/25/22 0250

## 2022-01-25 NOTE — ED Notes (Signed)
Pt restless, very confused, IV attempted multiple times, 22g to R hand. Pt noted to be in soft restraints on wrists, safety sitter at bedside

## 2022-01-25 NOTE — Consult Note (Signed)
  Attempted to see patient, she continues to be sedated, delirious, and unable to participate in meaningful assessment. I spoke with her nurse, Loreen Freud RN who reports she is about to receive another dose of ativan and be transported to CT.   At this time will recommend for night shift TTS team to attempt to evaluate patient. However, it is likely she will be more oriented and able to engage in assessment tomorrow morning. Psychiatry will continue to follow.

## 2022-01-25 NOTE — ED Notes (Signed)
Back from CT

## 2022-01-25 NOTE — ED Notes (Signed)
This writer has been pulled to sit, pt is extremely agitated and restless. Attempting to jump out of bed multiple times.

## 2022-01-25 NOTE — ED Notes (Signed)
Pt is attempting to climb out of the stretcher, removing her clothes, required constant supervision/direction, tangential speaking. Unable to answer questions ATT.

## 2022-01-25 NOTE — ED Notes (Signed)
Patient requires safety sitter, patient confused, high fall risk and is continuously attempting to get out of bed. Charge Nurse [place] RN made aware of patients behavior and requirement for sitter.

## 2022-01-25 NOTE — ED Provider Notes (Signed)
  Physical Exam  BP (!) 141/86 (BP Location: Right Arm)   Pulse 68   Temp 98.1 F (36.7 C) (Oral)   Resp 20   SpO2 99%   Physical Exam  Procedures  Procedures  ED Course / MDM   Clinical Course as of 01/25/22 2305  Tue Jan 25, 2022  1514 Patient easily arousable, oriented to self and location. [MR]  2020 attempted CT head, patient unwilling to cooperate [AS]  2113 Spoke with mother Marcie Bal reports that patient was going to [employer] and was doing well with her sobriety, but her insurance ran out 2 months ago and mother does not know if patient has gone back to drinking again.  Mother reports that the patient called her brother and was complaining of chest pain but did not want to come to the ED.  Her brother did convince her to call EMS. [AS]    Clinical Course User Index [AS] Deray Dawes, [place] Folk, PA-C [MR] Redwine, Cecilio Asper, PA-C   Medical Decision Making Amount and/or Complexity of Data Reviewed Labs: ordered. Radiology: ordered.  Risk OTC drugs. Prescription drug management. Decision regarding hospitalization.  Care assumed from [facility], [place]. In short, patient presented approximately 13 hours ago for Delta 8 ingestion and has had altered mental status throughout her stay. Has stayed drowsy but arousable, but has had 2 doses of ativan and a dose of phenobarbital for agitation which may be contributing to her symptoms. Workup is positive for THC, but is otherwise unremarkable. Current plan is to obtain a CT head and consult TTS for further psychiatric evaluation if stable. See note from previous provider for full history.  1645- patient is still drowsy but arouable. Alert to self. Will not stay awake for history. Will not answer questions appropriately.   2305- workup remains largely unremarkable. CT head negative. Patient was still agitated so she was given IM geodon and ativan. Was then more cooperative, but still not contributing to history. Patient's mother  reports that she was going to [facility] until her insurance ran out 2 months ago. Unsure if she has been drinking alcohol again or if she used other drugs. Will admit to medicine for alcohol withdrawal management and TTS consult. Question alcohol withdrawal vs polypharmacy vs acute psychosis. Stable vitals at the time of admission.         Roylene Reason, PA-C 01/25/22 2309    Lennice Sites, DO 01/25/22 2333

## 2022-01-25 NOTE — H&P (Signed)
History and Physical      Terri Ayala VOH:[id number] DOB: [birth year] DOA: 01/25/2022  PCP: Lorrene Reid, PA-C *** Patient coming from: home ***  I have personally briefly reviewed patient's old medical records in [place]  Chief Complaint: ***  HPI: Terri Ayala is a 66 y.o. female with medical history significant for *** who is admitted to [facility] on 01/25/2022 with *** after presenting from home*** to [facility] ED complaining of ***.    ***       ***   ED Course:  Vital signs in the ED were notable for the following: ***  Labs were notable for the following: ***  Per my interpretation, EKG in ED demonstrated the following:  ***  Imaging and additional notable ED work-up: ***  While in the ED, the following were administered: ***  Subsequently, the patient was admitted  ***  ***red    Review of Systems: As per HPI otherwise 10 point review of systems negative.   Past Medical History:  Diagnosis Date   Alcohol withdrawal ([place]) 11/2017; 01/04/2018   Alcoholism ([place])    Anxiety    Arthritis    "hands" (01/04/2018)   ASYMPTOMATIC POSTMENOPAUSAL STATUS 08/17/2009   Chronic lower back pain    Depression    DYSPHAGIA 06/19/2007   Fatty liver, alcoholic    GERD (gastroesophageal reflux disease)    GOITER, MULTINODULAR 08/17/2009   Hyperlipidemia    Hypertension    Migraines    "not sure what triggers them; I'll have 1 q couple weeks or more; come 2 days in a row when they come" (01/04/2018)   Mitral valve prolapse    OSTEOPOROSIS 08/17/2009   PONV (postoperative nausea and vomiting)    Supraventricular tachycardia     Past Surgical History:  Procedure Laterality Date   BREAST BIOPSY Right    "benign"   CATARACT EXTRACTION W/ INTRAOCULAR LENS  IMPLANT, BILATERAL     CERVICAL CONE BIOPSY  2000s   CERVIX LESION DESTRUCTION  1991   "dysplasia; lesions"   CHOLECYSTECTOMY N/A 03/14/2013   Procedure: LAPAROSCOPIC CHOLECYSTECTOMY WITH  ATTEMPTED INTRAOPERATIVE CHOLANGIOGRAM;  Surgeon: Harl Bowie, MD;  Location: [place];  Service: General;  Laterality: N/A;   HARDWARE REMOVAL Left 07/22/2021   Procedure: HARDWARE REMOVAL;  Surgeon: Altamese Onaga, MD;  Location: [place];  Service: Orthopedics;  Laterality: Left;   laporoscopic abdominal surgery     "for endometriosis"   LEFT HEART CATH AND CORONARY ANGIOGRAPHY N/A 05/07/2018   Procedure: LEFT HEART CATH AND CORONARY ANGIOGRAPHY;  Surgeon: Belva Crome, MD;  Location: [place] [facility];  Service: Cardiovascular;  Laterality: N/A;   ORIF ANKLE FRACTURE Left 02/11/2021   Procedure: OPEN REDUCTION INTERNAL FIXATION (ORIF) ANKLE FRACTURE;  Surgeon: Altamese Bodcaw, MD;  Location: [place];  Service: Orthopedics;  Laterality: Left;   TONSILLECTOMY      Social History:  reports that she quit smoking about 47 years ago. Her smoking use included cigarettes. She has a 7.00 pack-year smoking history. She has never used smokeless tobacco. She reports that she does not currently use alcohol after a past usage of about 28.0 standard drinks of alcohol per week. She reports that she does not use drugs.   Allergies  Allergen Reactions   Doxycycline Swelling    Mouth swelling and sores   Codeine Itching   Tetracycline Hcl Swelling    Mouth swelling and sores    Family History  Problem Relation Age  of Onset   Colon cancer Father        Deceased, 3   Osteoporosis Mother        Living, 84   Healthy Brother    Healthy Brother    Healthy Son    Healthy Son    Thyroid disease Neg Hx    Goiter Neg Hx     Family history reviewed and not pertinent ***   Prior to Admission medications   Medication Sig Start Date End Date Taking? Authorizing Provider  acetaminophen (TYLENOL) 500 MG tablet Take 500 mg by mouth every 8 (eight) hours as needed. Patient not taking: Reported on 12/07/2021    [provider]  albuterol (VENTOLIN HFA) 108 (90 Base) MCG/ACT inhaler INHALE 2  PUFFS INTO THE LUNGS EVERY 6 HOURS AS NEEDED FOR WHEEZING OR SHORTNESS OF BREATH. 07/02/21   Lorrene Reid, PA-C  alendronate (FOSAMAX) 70 MG tablet Take 70 mg by mouth every Monday.    [provider]  ampicillin (PRINCIPEN) 500 MG capsule Take 500 mg by mouth daily. 09/03/21   [provider]  Azelaic Acid 15 % gel     [provider]  baclofen (LIORESAL) 10 MG tablet Take 1 tablet (10 mg total) by mouth 3 (three) times daily. 12/17/21 03/17/22  Dara Hoyer, PA-C  buPROPion ER (WELLBUTRIN SR) 100 MG 12 hr tablet Take 1 tablet (100 mg total) by mouth 2 (two) times daily. 12/17/21 03/17/22  Dara Hoyer, PA-C  busPIRone (BUSPAR) 15 MG tablet Take 1 tablet (15 mg total) by mouth 3 (three) times daily. 12/17/21   Dara Hoyer, PA-C  Calcium Carbonate-Vitamin D (CALCIUM 600+D PO) Take 1 tablet by mouth daily.    [provider]  carvedilol (COREG) 6.25 MG tablet Take 1 tablet (6.25 mg total) by mouth 2 (two) times daily with a meal. 12/07/21   [place]za, [place], PA-C  CLENPIQ 10-3.5-12 MG-GM -GM/175ML SOLN SMARTSIG:175 Milliliter(s) By Mouth Twice a Week 12/09/21   [provider]  clindamycin (CLINDAGEL) 1 % gel     [provider]  [facility], [place] ROSACEA EX) Apply topically Once daily for 30 09/03/21 03/01/22  [provider]  diclofenac Sodium (VOLTAREN) 1 % GEL Apply 2 g 4 times a day by topical route.    [provider]  fluocinonide (LIDEX) 0.05 % external solution     [provider]  fluticasone (FLONASE) 50 MCG/ACT nasal spray Place 2 sprays into both nostrils daily as needed for allergies.    [provider]  fluticasone (FLOVENT HFA) 110 MCG/ACT inhaler 2 puff(s) Puff and swallow/ not for asthma, for EOE 2 times a day for 30 day(s) 11/03/21   [provider]  folic acid (FOLVITE) 1 MG tablet TAKE 1 TABLET BY MOUTH DAILY. 01/05/22   Lorrene Reid, PA-C  gabapentin  (NEURONTIN) 300 MG capsule Take 1 capsule twice a day by oral route.    [provider]  hydrocortisone 2.5 % cream hydrocortisone 2.5 % topical cream  APPLY TO AFFECTED AREA TWICE A DAY FOR 14 DAYS    [provider]  influenza vac split quadrivalent PF (AFLURIA QUADRIVALENT) 0.5 ML injection     [provider]  ketoconazole (NIZORAL) 2 % shampoo APPLY 5 MLS TO AFFECTED AREA 2 TO 3 TIMES A WEEK for 30    [provider]  lidocaine (LIDODERM) 5 % Apply 1 patch every 12 hours by topical route. Patient not taking: Reported on 12/07/2021  [provider]  meloxicam (MOBIC) 15 MG tablet Take 1 tablet (15 mg total) by mouth daily as needed for pain. 08/03/21   Lorrene Reid, PA-C  metroNIDAZOLE (FLAGYL) 500 MG tablet     [provider]  Multiple Vitamins-Minerals (ADULT GUMMY PO) Take 2 capsules by mouth daily.    [provider]  nitrofurantoin, macrocrystal-monohydrate, (MACROBID) 100 MG capsule Take 1 capsule twice a day by oral route for 7 days.    [provider]  norethindrone-ethinyl estradiol (FEMHRT 1/5) 1-5 MG-MCG TABS tablet Take 1 tablet by mouth daily.    [provider]  ondansetron (ZOFRAN-ODT) 8 MG disintegrating tablet     [provider]  pantoprazole (PROTONIX) 40 MG tablet TAKE 1 TABLET BY MOUTH 2 TIMES DAILY. 01/11/21   Lorrene Reid, PA-C  pneumococcal 13-valent conjugate vaccine (PREVNAR 13) SUSP injection PHARMACY ADMINISTERED    [provider]  SUMAtriptan (IMITREX) 100 MG tablet Take 1 tablet (100 mg total) by mouth every 2 (two) hours as needed for migraine. May repeat in 2 hours if headache persists or recurs. 12/07/21   Lorrene Reid, PA-C  thiamine (VITAMIN B-1) 100 MG tablet Take 1 tablet every day by oral route.    [provider]  traZODone (DESYREL) 50 MG tablet Take 50 mg by mouth at bedtime.    [provider]  triamcinolone cream (KENALOG) 0.1  % APPLY TO AFFECTED AREA TWICE A DAY    [provider]  Zoster Vaccine Adjuvanted [facility]) injection     [provider]  Zoster Vaccine Live, PF, (ZOSTAVAX) 16109 UNT/0.65ML injection     [provider]     Objective    Physical Exam: Vitals:   01/25/22 1201 01/25/22 1203 01/25/22 1856 01/25/22 2116  BP: (!) 145/77 (!) 141/86 130/78 119/69  Pulse: 68 68 80 94  Resp:  20  16  Temp:  98.1 F (36.7 C)  98.4 F (36.9 C)  TempSrc:  Oral  Oral  SpO2:  99%  100%    General: appears to be stated age; alert, oriented Skin: warm, dry, no rash Head:  AT/[place] Mouth:  Oral mucosa membranes appear moist, normal dentition Neck: supple; trachea midline Heart:  RRR; did not appreciate any M/R/G Lungs: CTAB, did not appreciate any wheezes, rales, or rhonchi Abdomen: + BS; soft, ND, NT Vascular: 2+ pedal pulses b/l; 2+ radial pulses b/l Extremities: no peripheral edema, no muscle wasting Neuro: strength and sensation intact in upper and lower extremities b/l ***   *** Neuro: 5/5 strength of the proximal and distal flexors and extensors of the upper and lower extremities bilaterally; sensation intact in upper and lower extremities b/l; cranial nerves II through XII grossly intact; no pronator drift; no evidence suggestive of slurred speech, dysarthria, or facial droop; Normal muscle tone. No tremors.  *** Neuro: In the setting of the patient's current mental status and associated inability to follow instructions, unable to perform full neurologic exam at this time.  As such, assessment of strength, sensation, and cranial nerves is limited at this time. Patient noted to spontaneously move all 4 extremities. No tremors.  ***    Labs on Admission: I have personally reviewed following labs and imaging studies  CBC: Recent Labs  Lab 01/25/22 0319  WBC 12.0*  NEUTROABS 9.1*  HGB 13.7  HCT 39.8  MCV 92.3  PLT 604   Basic Metabolic Panel: Recent Labs  Lab  01/25/22 0319  NA 141  K 3.8  CL 102  CO2 22  GLUCOSE 112*  BUN 14  CREATININE 1.10*  CALCIUM 10.0   GFR: CrCl cannot be calculated (Unknown ideal weight.). Liver Function Tests: Recent Labs  Lab 01/25/22 0319  AST 24  ALT 17  ALKPHOS 36*  BILITOT 0.9  PROT 7.4  ALBUMIN 4.4   No results for input(s): "LIPASE", "AMYLASE" in the last 168 hours. No results for input(s): "AMMONIA" in the last 168 hours. Coagulation Profile: No results for input(s): "INR", "PROTIME" in the last 168 hours. Cardiac Enzymes: No results for input(s): "CKTOTAL", "CKMB", "CKMBINDEX", "TROPONINI" in the last 168 hours. BNP (last 3 results) No results for input(s): "PROBNP" in the last 8760 hours. HbA1C: No results for input(s): "HGBA1C" in the last 72 hours. CBG: No results for input(s): "GLUCAP" in the last 168 hours. Lipid Profile: No results for input(s): "CHOL", "HDL", "LDLCALC", "TRIG", "CHOLHDL", "LDLDIRECT" in the last 72 hours. Thyroid Function Tests: No results for input(s): "TSH", "T4TOTAL", "FREET4", "T3FREE", "THYROIDAB" in the last 72 hours. Anemia Panel: No results for input(s): "VITAMINB12", "FOLATE", "FERRITIN", "TIBC", "IRON", "RETICCTPCT" in the last 72 hours. Urine analysis:    Component Value Date/Time   COLORURINE AMBER (A) 01/25/2022 0425   APPEARANCEUR HAZY (A) 01/25/2022 0425   LABSPEC 1.025 01/25/2022 0425   LABSPEC 1.030 09/01/2017 1443   PHURINE 7.0 01/25/2022 0425   GLUCOSEU NEGATIVE 01/25/2022 0425   HGBUR NEGATIVE 01/25/2022 0425   BILIRUBINUR NEGATIVE 01/25/2022 0425   BILIRUBINUR neg 08/28/2019 1612   KETONESUR 20 (A) 01/25/2022 0425   PROTEINUR 100 (A) 01/25/2022 0425   UROBILINOGEN 0.2 08/28/2019 1612   UROBILINOGEN 0.2 07/25/2007 0900   NITRITE NEGATIVE 01/25/2022 0425   LEUKOCYTESUR NEGATIVE 01/25/2022 0425    Radiological Exams on Admission: CT Head Wo Contrast  Result Date: 01/25/2022 CLINICAL DATA:  Intense anxiety following marijuana use,  initial encounter EXAM: CT HEAD WITHOUT CONTRAST TECHNIQUE: Contiguous axial images were obtained from the base of the skull through the vertex without intravenous contrast. RADIATION DOSE REDUCTION: This exam was performed according to the departmental dose-optimization program which includes automated exposure control, adjustment of the mA and/or kV according to patient size and/or use of iterative reconstruction technique. COMPARISON:  11/26/2021 FINDINGS: Brain: No evidence of acute infarction, hemorrhage, hydrocephalus, extra-axial collection or mass lesion/mass effect. Chronic atrophic changes and white matter ischemic changes are again seen. Vascular: No hyperdense vessel or unexpected calcification. Skull: Normal. Negative for fracture or focal lesion. Sinuses/Orbits: No acute finding. Other: None. IMPRESSION: Chronic changes without acute abnormality. Electronically Signed   By: Inez Catalina M.D.   On: 01/25/2022 21:58      Assessment/Plan   Principal Problem:   Acute encephalopathy   ***       ***            ***             ***            ***            ***            ***             ***           ***           ***   ***  DVT prophylaxis: SCD's ***  Code Status: Full code*** Family Communication: none*** Disposition Plan: Per Rounding Team Consults called: none***;  Admission status: ***     I  SPENT GREATER THAN 75 *** MINUTES IN CLINICAL CARE TIME/MEDICAL DECISION-MAKING IN COMPLETING THIS ADMISSION.      [place] DO [facility]  From [place]   01/25/2022, 10:51 PM   ***

## 2022-01-25 NOTE — Consult Note (Signed)
  TTS consult placed for patient. Terri Ayala, [place] reported to me she is delirious, drowsy, and needing head CT. Nursing to notify when she has returned/more able to engage in assessment.   Lorazepam 1 mg given at 1036 Lorazepam 2 mg given at 1208  Will attempt to evaluate later in the day.

## 2022-01-25 NOTE — ED Provider Notes (Signed)
[facility] EMERGENCY DEPARTMENT Provider Note   CSN: [id number] Arrival date & time: 01/25/22  3299     History  Chief Complaint  Patient presents with   [place] is a 66 y.o. female with a past medical history of anxiety and alcohol use disorder presenting today due to anxiety.  She does not participate in my history taking, level 5 caveat secondary to AMS.  Per triage team, patient reported taking 1 delta 8 gummy.   Anxiety       Home Medications Prior to Admission medications   Medication Sig Start Date End Date Taking? Authorizing Provider  acetaminophen (TYLENOL) 500 MG tablet Take 500 mg by mouth every 8 (eight) hours as needed. Patient not taking: Reported on 12/07/2021    [provider]  albuterol (VENTOLIN HFA) 108 (90 Base) MCG/ACT inhaler INHALE 2 PUFFS INTO THE LUNGS EVERY 6 HOURS AS NEEDED FOR WHEEZING OR SHORTNESS OF BREATH. 07/02/21   Lorrene Reid, PA-C  alendronate (FOSAMAX) 70 MG tablet Take 70 mg by mouth every Monday.    [provider]  ampicillin (PRINCIPEN) 500 MG capsule Take 500 mg by mouth daily. 09/03/21   [provider]  Azelaic Acid 15 % gel     [provider]  baclofen (LIORESAL) 10 MG tablet Take 1 tablet (10 mg total) by mouth 3 (three) times daily. 12/17/21 03/17/22  Dara Hoyer, PA-C  buPROPion ER (WELLBUTRIN SR) 100 MG 12 hr tablet Take 1 tablet (100 mg total) by mouth 2 (two) times daily. 12/17/21 03/17/22  Dara Hoyer, PA-C  busPIRone (BUSPAR) 15 MG tablet Take 1 tablet (15 mg total) by mouth 3 (three) times daily. 12/17/21   Dara Hoyer, PA-C  Calcium Carbonate-Vitamin D (CALCIUM 600+D PO) Take 1 tablet by mouth daily.    [provider]  carvedilol (COREG) 6.25 MG tablet Take 1 tablet (6.25 mg total) by mouth 2 (two) times daily with a meal. 12/07/21   Abonza, [place], PA-C  CLENPIQ 10-3.5-12 MG-GM -GM/175ML SOLN SMARTSIG:175 Milliliter(s) By  Mouth Twice a Week 12/09/21   [provider]  clindamycin (CLINDAGEL) 1 % gel     [provider]  Dermat[employer], [place]. [facility] ROSACEA EX) Apply topically Once daily for 30 09/03/21 03/01/22  [provider]  diclofenac Sodium (VOLTAREN) 1 % GEL Apply 2 g 4 times a day by topical route.    [provider]  fluocinonide (LIDEX) 0.05 % external solution     [provider]  fluticasone (FLONASE) 50 MCG/ACT nasal spray Place 2 sprays into both nostrils daily as needed for allergies.    [provider]  fluticasone (FLOVENT HFA) 110 MCG/ACT inhaler 2 puff(s) Puff and swallow/ not for asthma, for EOE 2 times a day for 30 day(s) 11/03/21   [provider]  folic acid (FOLVITE) 1 MG tablet TAKE 1 TABLET BY MOUTH DAILY. 01/05/22   Lorrene Reid, PA-C  gabapentin (NEURONTIN) 300 MG capsule Take 1 capsule twice a day by oral route.    [provider]  hydrocortisone 2.5 % cream hydrocortisone 2.5 % topical cream  APPLY TO AFFECTED AREA TWICE A DAY FOR 14 DAYS    [provider]  influenza vac split quadrivalent PF (AFLURIA QUADRIVALENT) 0.5 ML injection     [provider]  ketoconazole (NIZORAL) 2 % shampoo APPLY 5 MLS TO AFFECTED AREA 2 TO 3 TIMES A WEEK for 30  [provider]  lidocaine (LIDODERM) 5 % Apply 1 patch every 12 hours by topical route. Patient not taking: Reported on 12/07/2021    [provider]  meloxicam (MOBIC) 15 MG tablet Take 1 tablet (15 mg total) by mouth daily as needed for pain. 08/03/21   Lorrene Reid, PA-C  metroNIDAZOLE (FLAGYL) 500 MG tablet     [provider]  Multiple Vitamins-Minerals (ADULT GUMMY PO) Take 2 capsules by mouth daily.    [provider]  nitrofurantoin, macrocrystal-monohydrate, (MACROBID) 100 MG capsule Take 1 capsule twice a day by oral route for 7 days.    [provider]  norethindrone-ethinyl estradiol  (FEMHRT 1/5) 1-5 MG-MCG TABS tablet Take 1 tablet by mouth daily.    [provider]  ondansetron (ZOFRAN-ODT) 8 MG disintegrating tablet     [provider]  pantoprazole (PROTONIX) 40 MG tablet TAKE 1 TABLET BY MOUTH 2 TIMES DAILY. 01/11/21   Lorrene Reid, PA-C  pneumococcal 13-valent conjugate vaccine (PREVNAR 13) SUSP injection PHARMACY ADMINISTERED    [provider]  SUMAtriptan (IMITREX) 100 MG tablet Take 1 tablet (100 mg total) by mouth every 2 (two) hours as needed for migraine. May repeat in 2 hours if headache persists or recurs. 12/07/21   Lorrene Reid, PA-C  thiamine (VITAMIN B-1) 100 MG tablet Take 1 tablet every day by oral route.    [provider]  traZODone (DESYREL) 50 MG tablet Take 50 mg by mouth at bedtime.    [provider]  triamcinolone cream (KENALOG) 0.1 % APPLY TO AFFECTED AREA TWICE A DAY    [provider]  Zoster Vaccine Adjuvanted [facility]) injection     [provider]  Zoster Vaccine Live, PF, (ZOSTAVAX) 34196 UNT/0.65ML injection     [provider]      Allergies    Doxycycline, Codeine, and Tetracycline hcl    Review of Systems   Review of Systems  Physical Exam Updated Vital Signs BP 121/72 (BP Location: Right Arm)   Pulse 94   Temp 97.7 F (36.5 C) (Oral)   Resp 18   SpO2 100%  Physical Exam Vitals and nursing note reviewed.  Constitutional:      Appearance: Normal appearance.  HENT:     Head: Normocephalic and atraumatic.  Eyes:     General: No scleral icterus.    Conjunctiva/sclera: Conjunctivae normal.  Cardiovascular:     Rate and Rhythm: Normal rate and regular rhythm.  Pulmonary:     Effort: Pulmonary effort is normal. No respiratory distress.     Breath sounds: No wheezing.  Skin:    Findings: No rash.  Neurological:     Mental Status: She is alert.     Comments: Patient very agitated, does not participate in entirety of neurologic exam.  Following  commands, moving all extremities with normal strength in bilateral upper and lower.  Does not participate in EOMs, PERRLA  Psychiatric:        Mood and Affect: Mood normal.     ED Results / Procedures / Treatments   Labs (all labs ordered are listed, but only abnormal results are displayed) Labs Reviewed  RAPID URINE DRUG SCREEN, HOSP PERFORMED - Abnormal; Notable for the following components:      Result Value   Tetrahydrocannabinol POSITIVE (*)    All other components within normal limits  CBC WITH DIFFERENTIAL/PLATELET - Abnormal; Notable for the following components:   WBC 12.0 (*)    Neutro Abs 9.1 (*)  All other components within normal limits  COMPREHENSIVE METABOLIC PANEL - Abnormal; Notable for the following components:   Glucose, Bld 112 (*)    Creatinine, Ser 1.10 (*)    Alkaline Phosphatase 36 (*)    GFR, Estimated 55 (*)    Anion gap 17 (*)    All other components within normal limits  ACETAMINOPHEN LEVEL - Abnormal; Notable for the following components:   Acetaminophen (Tylenol), Serum <10 (*)    All other components within normal limits  SALICYLATE LEVEL - Abnormal; Notable for the following components:   Salicylate Lvl <2.4 (*)    All other components within normal limits  ETHANOL    EKG None  Radiology No results found.  Procedures Procedures   Medications Ordered in ED Medications  PHENObarbital 20 MG/5ML elixir 60 mg (has no administration in time range)  LORazepam (ATIVAN) injection 1 mg (1 mg Intramuscular Given 01/25/22 0252)  LORazepam (ATIVAN) tablet 1 mg (1 mg Oral Given 01/25/22 1036)    ED Course/ Medical Decision Making/ A&P Clinical Course as of 01/27/22 1549  Tue Jan 25, 2022  1514 Patient easily arousable, oriented to self and location. [MR]  2020 attempted CT head, patient unwilling to cooperate [AS]  2113 Spoke with mother Marcie Bal reports that patient was going to [employer] and was doing well with her sobriety, but her  insurance ran out 2 months ago and mother does not know if patient has gone back to drinking again.  Mother reports that the patient called her brother and was complaining of chest pain but did not want to come to the ED.  Her brother did convince her to call EMS. [AS]    Clinical Course User Index [AS] Schutt, [place], PA-C [MR] Erskin Zinda, Cecilio Asper, PA-C                           Medical Decision Making Amount and/or Complexity of Data Reviewed Labs: ordered. Radiology: ordered.  Risk OTC drugs. Prescription drug management. Decision regarding hospitalization.   66 year old female presenting today with anxiety and altered mental status.  History of alcohol use disorder.  Alcohol withdrawal was on differential however patient has normal vital signs, is not diaphoretic or tachycardic.  UDS positive for THC as expected.  No other drugs.  Negative ethanol.  Patient was originally given Ativan in triage with mild improvement in her agitation.  Unfortunately, she became agitated again and turned on the sink in the triage area, ultimately flooding to rooms.  She was then brought back to hallway where she received more Ativan due to agitation and trying to exit the stretcher.  I spoke about this patient with my attending and we decided to give her phenobarbital.    Originally TTS consult has been placed however patient was too drowsy to participate.  At this time a CT scan has been ordered as well as a UA.  Patient signed out to oncoming provider PA-C Cyndi Bender.  See his note for the remainder of patient's workup and ultimate disposition.  I suspect she will be held overnight until she is able to cooperate with the remainder of her workup.  They will consult TTS after she is more alert.   Final Clinical Impression(s) / ED Diagnoses Final diagnoses:  Agitation    Rx / DC Orders ED Discharge Orders     None        Jodean Valade, [place], PA-C 01/27/22 1551  Deno Etienne, DO 01/29/22  1457

## 2022-01-25 NOTE — ED Notes (Signed)
Pt is sporadically attempting to get out of stretcher. Pt remains unable to answer questions, redirectable after numerous attempts.

## 2022-01-25 NOTE — ED Notes (Signed)
Pt placed in soft restraints.

## 2022-01-25 NOTE — ED Notes (Signed)
Santana Gosdin, mom, [phone] or Elta Guadeloupe, brother, [phone] would like updates on pt when available

## 2022-01-25 NOTE — ED Notes (Signed)
Sitter at bedside for pt.

## 2022-01-25 NOTE — ED Provider Notes (Addendum)
Shared PA visit.  History, physical, medical decision made.  Patient has been here for prolonged amount of time.  It was thought that she was having some intense anxiety from some delta 8/cannabis that she use.  She has a significant history of alcohol abuse.  Alcohol level was undetectable upon arrival here.  My suspicion clinically is that she has gone into alcohol withdrawal.  She has been getting some Ativan, Geodon but she still having some hallucinations, needing multiple redirections, agitation.  CIWA score seems to be elevated multiple times.  We have been having a hard time getting her calm for CT scan of her head.  We were eventually able to get this to rule out any head injury.  CT scan shows no acute findings.  UDS is positive for marijuana but otherwise lab work per my review and interpretation some remarkable.  Family states that she has been in [place] and doing well but they are not sure if she has been drinking recently.  Overall she still does not appear to be at her baseline.  My suspicion is that this is polypharmacy from heavy marijuana use/delta 8 Gummies versus possibly alcohol withdrawal.  She is now needing some soft restraints.  Seems that when she does get Ativan she does have improvements.  At this time I think she needs medical admission for further metabolization of marijuana/definitely Gummies as well as CIWA protocol/alcohol withdrawal protocol.  Patient been hemodynamically stable.  No concern for infectious process.  Will admit to medicine.  I would also recommend inpatient psychiatry consultation as well as this could be psychosis secondary to drug use as well.  This chart was dictated using voice recognition software.  Despite best efforts to proofread,  errors can occur which can change the documentation meaning.    Lennice Sites, DO 01/25/22 [place], [place], DO 01/25/22 2220

## 2022-01-25 NOTE — ED Notes (Signed)
Pt frantically running around triage.  Pt wishes to leave and go home to her dog.  Pt yelling "help me help me"  " I can't do this anymore"  Pt assisted to bathroom and back.

## 2022-01-25 NOTE — ED Triage Notes (Addendum)
BIB EMS from home.  Pt has been attempting to remove seat belts and get out of [place] . Pt states she had some "bad cannabis" she ingested this evening.  Pt is highly anxious, unable to obtain vitals per EMS.  Refused CBG.  Pt reported to EMS "this happens from time to time"  Pt is hyperventilating at time of triage.

## 2022-01-25 NOTE — ED Notes (Signed)
Patient transported to CT 

## 2022-01-26 ENCOUNTER — Inpatient Hospital Stay (HOSPITAL_COMMUNITY): Payer: Medicare Other

## 2022-01-26 DIAGNOSIS — R6510 Systemic inflammatory response syndrome (SIRS) of non-infectious origin without acute organ dysfunction: Secondary | ICD-10-CM | POA: Diagnosis present

## 2022-01-26 DIAGNOSIS — F1010 Alcohol abuse, uncomplicated: Secondary | ICD-10-CM | POA: Diagnosis not present

## 2022-01-26 DIAGNOSIS — F32A Depression, unspecified: Secondary | ICD-10-CM | POA: Diagnosis not present

## 2022-01-26 DIAGNOSIS — E860 Dehydration: Secondary | ICD-10-CM | POA: Diagnosis present

## 2022-01-26 DIAGNOSIS — G9340 Encephalopathy, unspecified: Secondary | ICD-10-CM | POA: Diagnosis not present

## 2022-01-26 DIAGNOSIS — F331 Major depressive disorder, recurrent, moderate: Secondary | ICD-10-CM | POA: Insufficient documentation

## 2022-01-26 LAB — I-STAT VENOUS BLOOD GAS, ED
Acid-Base Excess: 2 mmol/L (ref 0.0–2.0)
Bicarbonate: 21.8 mmol/L (ref 20.0–28.0)
Calcium, Ion: 0.82 mmol/L — CL (ref 1.15–1.40)
HCT: 32 % — ABNORMAL LOW (ref 36.0–46.0)
Hemoglobin: 10.9 g/dL — ABNORMAL LOW (ref 12.0–15.0)
O2 Saturation: 99 %
Potassium: 3.2 mmol/L — ABNORMAL LOW (ref 3.5–5.1)
Sodium: 137 mmol/L (ref 135–145)
TCO2: 22 mmol/L (ref 22–32)
pCO2, Ven: 20.1 mmHg — ABNORMAL LOW (ref 44–60)
pH, Ven: 7.643 (ref 7.25–7.43)
pO2, Ven: 110 mmHg — ABNORMAL HIGH (ref 32–45)

## 2022-01-26 LAB — CBC WITH DIFFERENTIAL/PLATELET
Abs Immature Granulocytes: 0.04 10*3/uL (ref 0.00–0.07)
Basophils Absolute: 0.1 10*3/uL (ref 0.0–0.1)
Basophils Relative: 0 %
Eosinophils Absolute: 0.1 10*3/uL (ref 0.0–0.5)
Eosinophils Relative: 0 %
HCT: 33.9 % — ABNORMAL LOW (ref 36.0–46.0)
Hemoglobin: 11.6 g/dL — ABNORMAL LOW (ref 12.0–15.0)
Immature Granulocytes: 0 %
Lymphocytes Relative: 17 %
Lymphs Abs: 2 10*3/uL (ref 0.7–4.0)
MCH: 32.1 pg (ref 26.0–34.0)
MCHC: 34.2 g/dL (ref 30.0–36.0)
MCV: 93.9 fL (ref 80.0–100.0)
Monocytes Absolute: 1.1 10*3/uL — ABNORMAL HIGH (ref 0.1–1.0)
Monocytes Relative: 9 %
Neutro Abs: 8.4 10*3/uL — ABNORMAL HIGH (ref 1.7–7.7)
Neutrophils Relative %: 74 %
Platelets: 247 10*3/uL (ref 150–400)
RBC: 3.61 MIL/uL — ABNORMAL LOW (ref 3.87–5.11)
RDW: 13.8 % (ref 11.5–15.5)
WBC: 11.5 10*3/uL — ABNORMAL HIGH (ref 4.0–10.5)
nRBC: 0 % (ref 0.0–0.2)

## 2022-01-26 LAB — COMPREHENSIVE METABOLIC PANEL
ALT: 16 U/L (ref 0–44)
AST: 37 U/L (ref 15–41)
Albumin: 3.8 g/dL (ref 3.5–5.0)
Alkaline Phosphatase: 34 U/L — ABNORMAL LOW (ref 38–126)
Anion gap: 13 (ref 5–15)
BUN: 16 mg/dL (ref 8–23)
CO2: 23 mmol/L (ref 22–32)
Calcium: 8.9 mg/dL (ref 8.9–10.3)
Chloride: 102 mmol/L (ref 98–111)
Creatinine, Ser: 0.96 mg/dL (ref 0.44–1.00)
GFR, Estimated: >60 mL/min (ref >60)
Glucose, Bld: 96 mg/dL (ref 70–99)
Potassium: 3 mmol/L — ABNORMAL LOW (ref 3.5–5.1)
Sodium: 138 mmol/L (ref 135–145)
Total Bilirubin: 1 mg/dL (ref 0.3–1.2)
Total Protein: 6 g/dL — ABNORMAL LOW (ref 6.5–8.1)

## 2022-01-26 LAB — PROTIME-INR
INR: 1.1 (ref 0.8–1.2)
Prothrombin Time: 14.2 seconds (ref 11.4–15.2)

## 2022-01-26 LAB — TROPONIN I (HIGH SENSITIVITY)
Troponin I (High Sensitivity): 7 ng/L (ref <18)
Troponin I (High Sensitivity): 5 ng/L (ref <18)

## 2022-01-26 LAB — TSH: TSH: 1.753 u[IU]/mL (ref 0.350–4.500)

## 2022-01-26 LAB — CK: Total CK: 743 U/L — ABNORMAL HIGH (ref 38–234)

## 2022-01-26 LAB — MAGNESIUM
Magnesium: 2 mg/dL (ref 1.7–2.4)
Magnesium: 1.9 mg/dL (ref 1.7–2.4)

## 2022-01-26 LAB — PROCALCITONIN: Procalcitonin: <0.1 ng/mL

## 2022-01-26 MED ORDER — LACTATED RINGERS IV SOLN: INTRAVENOUS | Status: AC

## 2022-01-26 MED ORDER — POTASSIUM CHLORIDE 10 MEQ/100ML IV SOLN
10.0000 meq | INTRAVENOUS | Status: AC
  Administered 2022-01-26 (×4): 10 meq via INTRAVENOUS
  Filled 2022-01-26 (×4): qty 100

## 2022-01-26 MED ORDER — LACTATED RINGERS IV SOLN: INTRAVENOUS | Status: DC

## 2022-01-26 MED ORDER — STERILE WATER FOR INJECTION IJ SOLN
INTRAMUSCULAR | Status: AC
  Filled 2022-01-26: qty 10

## 2022-01-26 NOTE — Progress Notes (Addendum)
PROGRESS NOTE    Terri Ayala  FYB:[id number] DOB: 02/26/1956 DOA: 01/25/2022 PCP: Lorrene Reid, PA-C    Brief Narrative:  Terri Ayala is a 66 y.o. female with past medical history significant for generalized anxiety disorder, depression, alcohol abuse, essential hypertension, chronic back pain patient hospitalization on 52/77/8242 with metabolic encephalopathy presented to the hospital again with altered mental status, agitation.  Patient admitted consumption of delta 8 THC Gummies and has previous history of  alcohol abuse with abstinence May 2023.  Patient's family however unsure whether the patient was drinking alcohol again over the last month.  Had alcohol detox done in the past.  Patient was afebrile mildly tachycardic.  CBC showed mild leukocytosis.  Urinalysis was negative.  Creatinine at 1.1.  Urine drug screen was positive for THC.   CT head without any acute findings.  Due to concern for  acute alcohol withdrawal, patient was started on CIWA protocol and also received Phenobarbital 60 mg p.o. x1, Geodon 20 mg IM x1 dose.  Patient was then admitted hospital for further evaluation and treatment.   Assessment and plan.   Acute metabolic encephalopathy:  Etiology unclear.  Likely multifactorial from possibility of mild alcohol withdrawal recent THC usage and possibility of polypharmacy since the patient is on.scheduled baclofen, Wellbutrin, BuSpar, gabapentin, trazodone.  No obvious source of infection at this time.  Urinalysis was negative.  Chest x-ray with increased interstitial markings but no definite infiltrate.  No mention of seizures.  CT head was unremarkable.  Ammonia level pending.  Hold central acting medications.  Continue IV fluids.  Procalcitonin less than 0.1.  Continue Air cabin crew and restraints.  Has been having agitation confusion trying to pull lines.   History of alcohol abuse:  Had undergone detox in May 2023.  Has been started on CIWA protocol  for concerns of  ongoing issues.  Continue thiamine, folic acid, multivitamin.  Seizure precautions.   Volume depletion/dehydration  On presentation.  Continue IV fluids.      SIRS criteria present: Had mild leukocytosis and tachycardia.  No obvious source of infection at this time.  Urinalysis and chest x-ray unremarkable.     Hypokalemia.  Will replace with 40 mEq of potassium today.  Check levels in AM.   GAD/depression: Patient is on Wellbutrin, BuSpar, gabapentin.  In the context of altered mental status and possible polypharmacy we will hold for now.     Essential Hypertension:  On Coreg as outpatient.  Currently on hold, latest blood pressure of 118/64       DVT prophylaxis: enoxaparin (LOVENOX) injection 40 mg Start: 01/26/22 0800   Code Status:     Code Status: Full Code  Disposition: Home  Status is: Inpatient  Remains inpatient appropriate because: Confusion, encephalopathy, CIWA protocol,   Family Communication: None at bedside  Consultants:  None  Procedures:  None  Antimicrobials:  None  Anti-infectives (From admission, onward)    None      Subjective: Today, patient was seen and examined at bedside.  Nursing staff reported confusion and agitation trying to pull lines..  Mostly sleepy at this time.  Objective: Vitals:   01/26/22 0615 01/26/22 0659 01/26/22 0729 01/26/22 1013  BP: 135/87 118/64  118/64  Pulse: 80 (!) 101  (!) 102  Resp: 17     Temp:   98.1 F (36.7 C)   TempSrc:   Axillary   SpO2: 97%       Intake/Output Summary (Last 24 hours) at 01/26/2022 1021  Last data filed at 01/25/2022 1843 Gross per 24 hour  Intake 240 ml  Output --  Net 240 ml   There were no vitals filed for this visit.  Physical Examination: There is no height or weight on file to calculate BMI.  General:  Average built, confused, agitated, mostly somnolent  HENT:   No scleral pallor or icterus noted. Oral mucosa is moist.  Chest:  Clear breath sounds.  Diminished breath  sounds bilaterally. No crackles or wheezes.  CVS: S1 &S2 heard. No murmur.  Regular rate and rhythm. Abdomen: Soft, nontender, nondistended.  Bowel sounds are heard.   Extremities: No cyanosis, clubbing or edema.  Peripheral pulses are palpable. Psych: Alert somnolent, was agitated and confused today CNS: Moves all extremities. Skin: Warm and dry.  No rashes noted.  Data Reviewed:   CBC: Recent Labs  Lab 01/25/22 0319 01/26/22 0135 01/26/22 0152  WBC 12.0*  --  11.5*  NEUTROABS 9.1*  --  8.4*  HGB 13.7 10.9* 11.6*  HCT 39.8 32.0* 33.9*  MCV 92.3  --  93.9  PLT 314  --  637    Basic Metabolic Panel: Recent Labs  Lab 01/25/22 0319 01/26/22 0042 01/26/22 0135 01/26/22 0152  NA 141  --  137 138  K 3.8  --  3.2* 3.0*  CL 102  --   --  102  CO2 22  --   --  23  GLUCOSE 112*  --   --  96  BUN 14  --   --  16  CREATININE 1.10*  --   --  0.96  CALCIUM 10.0  --   --  8.9  MG  --  2.0  --  1.9    Liver Function Tests: Recent Labs  Lab 01/25/22 0319 01/26/22 0152  AST 24 37  ALT 17 16  ALKPHOS 36* 34*  BILITOT 0.9 1.0  PROT 7.4 6.0*  ALBUMIN 4.4 3.8     Radiology Studies: DG Chest Port 1 View  Result Date: 01/26/2022 CLINICAL DATA:  Acute encephalopathy EXAM: PORTABLE CHEST - 1 VIEW COMPARISON:  02/10/2021 FINDINGS: Examination is limited due to air low depth of inspiration. Cardiomediastinal silhouette and pulmonary vasculature are within normal limits. Increased interstitial markings seen bilaterally possibly due to early pulmonary edema. IMPRESSION: Increased interstitial markings seen bilaterally possibly due to early pulmonary edema. Electronically Signed   By: Miachel Roux M.D.   On: 01/26/2022 08:16   CT Head Wo Contrast  Result Date: 01/25/2022 CLINICAL DATA:  Intense anxiety following marijuana use, initial encounter EXAM: CT HEAD WITHOUT CONTRAST TECHNIQUE: Contiguous axial images were obtained from the base of the skull through the vertex without  intravenous contrast. RADIATION DOSE REDUCTION: This exam was performed according to the departmental dose-optimization program which includes automated exposure control, adjustment of the mA and/or kV according to patient size and/or use of iterative reconstruction technique. COMPARISON:  11/26/2021 FINDINGS: Brain: No evidence of acute infarction, hemorrhage, hydrocephalus, extra-axial collection or mass lesion/mass effect. Chronic atrophic changes and white matter ischemic changes are again seen. Vascular: No hyperdense vessel or unexpected calcification. Skull: Normal. Negative for fracture or focal lesion. Sinuses/Orbits: No acute finding. Other: None. IMPRESSION: Chronic changes without acute abnormality. Electronically Signed   By: Inez Catalina M.D.   On: 01/25/2022 21:58      LOS: 1 day   Flora Lipps, MD [facility] Available via Epic secure chat 7am-7pm After these hours, please refer to coverage provider listed on [email or web address]  01/26/2022, 10:21 AM

## 2022-01-26 NOTE — ED Notes (Signed)
Pt attempting to get up, won't keep monitor on

## 2022-01-26 NOTE — ED Notes (Signed)
MD notified pt's refusal to take PO meds.  Pt continues to climb out of bed, difficult to redirect her.  Pt keeps taking clothes and monitoring devices off. Safety sitter ordered.  Charge nurse notified.

## 2022-01-26 NOTE — ED Notes (Signed)
Pt removed off soft restraints.

## 2022-01-26 NOTE — ED Notes (Signed)
Pt will not keep monitoring equipment on. Will give PRN med.

## 2022-01-26 NOTE — Hospital Course (Addendum)
Terri Ayala is a 66 y.o. female with past medical history significant for generalized anxiety disorder, depression, alcohol abuse, essential hypertension, chronic back pain patient hospitalization on 69/67/8938 with metabolic encephalopathy presented to the hospital again with altered mental status agitation.  Patient admitted consumption of delta 8 THC Gummies and has previous history of  alcohol abuse with abstinence May 2023.  Patient's family however unsure whether the patient was drinking alcohol again over the last month.  Had alcohol detox done in the past.  Patient was afebrile mildly tachycardic.  CBC showed mild leukocytosis.  Urinalysis was negative.  Creatinine at 1.1.  Urine drug screen was positive for THC.   CT head without any acute findings.  Due to concern for  acute alcohol withdrawal patient was started on CIWA protocol and also received Phenobarbital 60 mg p.o. x1, Geodon 20 mg IM x1 dose.  Patient was then admitted hospital for further evaluation and treatment.   Assessment and plan.   Acute metabolic encephalopathy: Etiology unclear.  Likely multifactorial from possibility of mild alcohol withdrawal recent THC usage and possibility of polypharmacy since the patient is on.scheduled baclofen, Wellbutrin, BuSpar, gabapentin, trazodone.  No obvious source of infection at this time.  Urinalysis was negative.  Chest x-ray with increased interstitial markings but no definite infiltrate.  No mention of seizures.  CT head was unremarkable.  Ammonia level pending.  Hold central acting medications.  Continue IV fluids.  Procalcitonin less than 0.1.  Continue Air cabin crew and restraints.  Has been having agitation confusion trying to pull lines.   History of alcohol abuse:  Had undergone detox in May 2023.  Has been started on CIWA protocol  for concerns of ongoing issues.  Continue thiamine, folic acid, multivitamin.  Seizure precautions.    Volume depletion/dehydration  On presentation.   Continue IV fluids.      SIRS criteria present: Had mild leukocytosis and tachycardia.  No obvious source of infection at this time.  Urinalysis and chest x-ray unremarkable.       GAD/depression: Patient is on Wellbutrin, BuSpar, gabapentin.  In the context of altered mental status and possible polypharmacy we will hold for now.     Essential Hypertension:  On Coreg as outpatient.  Currently on hold, latest blood pressure of 118/64

## 2022-01-26 NOTE — ED Notes (Signed)
Patient removed monitoring equipment and clothes, reattached equipment, cleaned patient of incontinence and placed clean gown on patient.

## 2022-01-26 NOTE — ED Notes (Signed)
Pt placed on monitor, safety sitter at bedside.

## 2022-01-27 DIAGNOSIS — E860 Dehydration: Secondary | ICD-10-CM | POA: Diagnosis not present

## 2022-01-27 DIAGNOSIS — F32A Depression, unspecified: Secondary | ICD-10-CM | POA: Diagnosis not present

## 2022-01-27 DIAGNOSIS — G9340 Encephalopathy, unspecified: Secondary | ICD-10-CM | POA: Diagnosis not present

## 2022-01-27 DIAGNOSIS — F1010 Alcohol abuse, uncomplicated: Secondary | ICD-10-CM | POA: Diagnosis not present

## 2022-01-27 LAB — CBC
HCT: 35.9 % — ABNORMAL LOW (ref 36.0–46.0)
Hemoglobin: 12.4 g/dL (ref 12.0–15.0)
MCH: 32.4 pg (ref 26.0–34.0)
MCHC: 34.5 g/dL (ref 30.0–36.0)
MCV: 93.7 fL (ref 80.0–100.0)
Platelets: 236 10*3/uL (ref 150–400)
RBC: 3.83 MIL/uL — ABNORMAL LOW (ref 3.87–5.11)
RDW: 13.6 % (ref 11.5–15.5)
WBC: 8.4 10*3/uL (ref 4.0–10.5)
nRBC: 0 % (ref 0.0–0.2)

## 2022-01-27 LAB — BASIC METABOLIC PANEL
Anion gap: 14 (ref 5–15)
BUN: 11 mg/dL (ref 8–23)
CO2: 21 mmol/L — ABNORMAL LOW (ref 22–32)
Calcium: 9 mg/dL (ref 8.9–10.3)
Chloride: 105 mmol/L (ref 98–111)
Creatinine, Ser: 0.76 mg/dL (ref 0.44–1.00)
GFR, Estimated: >60 mL/min (ref >60)
Glucose, Bld: 78 mg/dL (ref 70–99)
Potassium: 3.8 mmol/L (ref 3.5–5.1)
Sodium: 140 mmol/L (ref 135–145)

## 2022-01-27 LAB — MAGNESIUM: Magnesium: 1.8 mg/dL (ref 1.7–2.4)

## 2022-01-27 MED ORDER — BUDESONIDE 0.25 MG/2ML IN SUSP
0.2500 mg | Freq: Two times a day (BID) | RESPIRATORY_TRACT | Status: DC
  Administered 2022-01-27 – 2022-01-29 (×3): 0.25 mg via RESPIRATORY_TRACT
  Filled 2022-01-27 (×5): qty 2

## 2022-01-27 MED ORDER — FLUTICASONE PROPIONATE 50 MCG/ACT NA SUSP: 2.0000 | Freq: Every day | NASAL | Status: DC | PRN

## 2022-01-27 MED ORDER — ALBUTEROL SULFATE (2.5 MG/3ML) 0.083% IN NEBU: 3.0000 mL | INHALATION_SOLUTION | Freq: Four times a day (QID) | RESPIRATORY_TRACT | Status: DC | PRN

## 2022-01-27 MED ORDER — CARVEDILOL 6.25 MG PO TABS
6.2500 mg | ORAL_TABLET | Freq: Two times a day (BID) | ORAL | Status: DC
  Administered 2022-01-27 – 2022-01-29 (×4): 6.25 mg via ORAL
  Filled 2022-01-27 (×4): qty 1

## 2022-01-27 MED ORDER — TRAZODONE HCL 50 MG PO TABS
50.0000 mg | ORAL_TABLET | Freq: Every day | ORAL | Status: DC
  Administered 2022-01-27 – 2022-01-28 (×2): 50 mg via ORAL
  Filled 2022-01-27 (×2): qty 1

## 2022-01-27 NOTE — Progress Notes (Signed)
  Transition of Care [facility]) Screening Note   Patient Details  Name: Terri Ayala Date of Birth: 12/27/1955   Transition of Care [facility]) CM/SW Contact:    Pollie Friar, RN Phone Number: 01/27/2022, 1:26 PM   CM attempted to talk with patient but she remains confused and unable to answer questions appropriately.  Transition of Care Department [facility]) has reviewed patient. We will continue to monitor patient advancement through interdisciplinary progression rounds. TOC following and will do SA screen when she is more appropriate.

## 2022-01-27 NOTE — Evaluation (Signed)
Physical Therapy Evaluation Patient Details Name: Terri Ayala MRN: [id number] DOB: 10/12/1955 Today's Date: 01/27/2022  History of Present Illness  66 y.o. female with medical history significant for generalized anxiety disorder, depression, alcohol abuse, essential hypertension, chronic back pain, who is admitted to [facility] on 01/25/2022 with acute encephalopathy.  Clinical Impression   Pt admitted secondary to problem above with deficits below. PTA patient was living alone and apparently independent with mobility and ADLs.  Pt currently requires minguard assist due to decr cognition. Patient should progress back to her baseline and be able to return home alone. If confusion persists, may need to consider SNF (unless there is family to provide 24/7 supervision).  Anticipate patient will benefit from PT to address problems listed below.Will continue to follow acutely to maximize functional mobility independence and safety.          Recommendations for follow up therapy are one component of a multi-disciplinary discharge planning process, led by the attending physician.  Recommendations may be updated based on patient status, additional functional criteria and insurance authorization.  Follow Up Recommendations No PT follow up      Assistance Recommended at Discharge Frequent or constant Supervision/Assistance (due to AMS; if cognition improves, may not need frequent supervision)  Patient can return home with the following  A little help with walking and/or transfers;A little help with bathing/dressing/bathroom;Assistance with cooking/housework;Direct supervision/assist for financial management;Direct supervision/assist for medications management;Assist for transportation;Help with stairs or ramp for entrance    Equipment Recommendations None recommended by PT  Recommendations for Other Services       Functional Status Assessment Patient has had a recent decline in their  functional status and demonstrates the ability to make significant improvements in function in a reasonable and predictable amount of time.     Precautions / Restrictions Precautions Precautions: Fall      Mobility  Bed Mobility Overal bed mobility: Needs Assistance Bed Mobility: Supine to Sit, Sit to Supine     Supine to sit: Supervision Sit to supine: Supervision   General bed mobility comments: supervision for safety due to AMS;    Transfers Overall transfer level: Needs assistance Equipment used: None Transfers: Sit to/from Stand Sit to Stand: Min guard           General transfer comment: no physical assist; guarding for safety    Ambulation/Gait Ambulation/Gait assistance: Min guard Gait Distance (Feet): 50 Feet Assistive device: None Gait Pattern/deviations: WFL(Within Functional Limits)       General Gait Details: pt wanting to look for her dog, therefore did not take her out of her room due to risk she would not return; no imbalance with head turns and multiple turns in room as she looked about  Stairs            Wheelchair Mobility    Modified Rankin (Stroke Patients Only)       Balance Overall balance assessment: No apparent balance deficits (not formally assessed)                                           Pertinent Vitals/Pain Pain Assessment Pain Assessment: No/denies pain    Home Living Family/patient expects to be discharged to:: Private residence Living Arrangements: Alone   Type of Home: House Home Access: Stairs to enter Entrance Stairs-Rails: Right;Left Entrance Stairs-Number of Steps: 1 Alternate Level Stairs-Number of Steps:  14 Home Layout: Able to live on main level with bedroom/bathroom;1/2 bath on main level;Multi-level Home Equipment: Rolling Walker (2 wheels);Cane - single point;BSC/3in1;Shower seat Additional Comments: Above information obtained via chart with pt unreliable due to AMS    Prior  Function Prior Level of Function : Independent/Modified Independent;History of Falls (last six months)             Mobility Comments: reports does not use a device (pt with AMS with ?accuracy) ADLs Comments: reports independent     Hand Dominance        Extremity/Trunk Assessment   Upper Extremity Assessment Upper Extremity Assessment: Overall WFL for tasks assessed    Lower Extremity Assessment Lower Extremity Assessment: Overall WFL for tasks assessed    Cervical / Trunk Assessment Cervical / Trunk Assessment: Normal  Communication   Communication: HOH  Cognition Arousal/Alertness: Lethargic, Suspect due to medications (sleeping on arrival; improved throughout session) Behavior During Therapy: Anxious Overall Cognitive Status: No family/caregiver present to determine baseline cognitive functioning                                 General Comments: able to follow commands for basic mobility; concerned about who would be taking care of her dog and looking for cellphone        General Comments      Exercises     Assessment/Plan    PT Assessment Patient needs continued PT services  PT Problem List Decreased mobility;Decreased cognition;Decreased knowledge of use of DME;Decreased safety awareness;Decreased knowledge of precautions       PT Treatment Interventions DME instruction;Gait training;Stair training;Functional mobility training;Therapeutic activities;Balance training;Cognitive remediation;Patient/family education    PT Goals (Current goals can be found in the Care Plan section)  Acute Rehab PT Goals Patient Stated Goal: wants to find her dog PT Goal Formulation: Patient unable to participate in goal setting Time For Goal Achievement: 02/10/22 Potential to Achieve Goals: Good    Frequency Min 3X/week     Co-evaluation               AM-PAC PT "6 Clicks" Mobility  Outcome Measure Help needed turning from your back to your side  while in a flat bed without using bedrails?: None Help needed moving from lying on your back to sitting on the side of a flat bed without using bedrails?: A Little Help needed moving to and from a bed to a chair (including a wheelchair)?: A Little Help needed standing up from a chair using your arms (e.g., wheelchair or bedside chair)?: A Little Help needed to walk in hospital room?: A Little Help needed climbing 3-5 steps with a railing? : A Little 6 Click Score: 19    End of Session Equipment Utilized During Treatment: Gait belt Activity Tolerance: Patient tolerated treatment well Patient left: in bed;with call bell/phone within reach;with bed alarm set Nurse Communication: Mobility status PT Visit Diagnosis: Difficulty in walking, not elsewhere classified (R26.2)    Time: 3790-2409 PT Time Calculation (min) (ACUTE ONLY): 10 min   Charges:   PT Evaluation $PT Eval Low Complexity: 1 Low           Chelsea Orvis Stann 01/27/2022, 3:00 PM

## 2022-01-27 NOTE — Progress Notes (Addendum)
PROGRESS NOTE    Terri Ayala  LFY:[id number] DOB: 02/27/1956 DOA: 01/25/2022 PCP: Lorrene Reid, PA-C    Brief Narrative:  Terri Ayala is a 66 y.o. female with past medical history significant for generalized anxiety disorder, depression, alcohol abuse, essential hypertension, chronic back pain patient hospitalization on 85/27/7824 with metabolic encephalopathy presented to the hospital again with altered mental status, agitation.  Patient admitted consumption of delta 8 THC Gummies and has previous history of  alcohol abuse with abstinence May 2023.  Patient's family however unsure whether the patient was drinking alcohol again over the last month.  Had alcohol detox done in the past.  Patient was afebrile mildly tachycardic.  CBC showed mild leukocytosis.  Urinalysis was negative.  Creatinine at 1.1.  Urine drug screen was positive for THC.   CT head without any acute findings.  Due to concern for  acute alcohol withdrawal, patient was started on CIWA protocol and also received Phenobarbital 60 mg p.o. x1, Geodon 20 mg IM x1 dose.  Patient was then admitted hospital for further evaluation and treatment.   Assessment and plan.   Acute metabolic encephalopathy:  Etiology unclear.  Likely multifactorial from possibility of mild alcohol withdrawal recent THC usage and possibility of polypharmacy since the patient is on.scheduled baclofen, Wellbutrin, BuSpar, gabapentin, trazodone.  No obvious source of infection at this time.  Urinalysis was negative.  Chest x-ray with increased interstitial markings but no definite infiltrate.  No mention of seizures.  CT head was unremarkable.  Will continue to hold central acting medications.  Procalcitonin less than 0.1.  And is more alert awake and oriented to place and month today.  Off restraints and sitter.     History of alcohol abuse:  Had undergone detox in May 2023.  Unclear if she continues to drink currently.  Has been started on CIWA protocol  for  concerns of ongoing issues.  Continue thiamine, folic acid, multivitamin.  Continue seizure precautions.   Volume depletion/dehydration with Mild AKI On presentation.  Will discontinue IV fluids.  Add clear liquids to start with.   SIRS: Had mild leukocytosis and tachycardia.  No obvious source of infection at this time.  Urinalysis and chest x-ray unremarkable.  Leukocytosis has improved.   Hypokalemia.  Improved after replacement.  Potassium of 3.8 today.[name] Kitchen   GAD/depression: Patient is on Wellbutrin, BuSpar, gabapentin.  In the context of altered mental status and possible polypharmacy, we will thinly to hold for now.  Still feels sleepy.   Essential Hypertension:  Elevated today.  On Coreg as outpatient.  Will restart today.   Debility, weakness.  Fall precaution.  Will get PT evaluation.     DVT prophylaxis: enoxaparin (LOVENOX) injection 40 mg Start: 01/26/22 0800   Code Status:     Code Status: Full Code  Disposition: Home  Status is: Inpatient  Remains inpatient appropriate because: Metabolic encephalopathy, CIWA protocol,   Family Communication:  None at bedside  Consultants:  None  Procedures:  None  Antimicrobials:  None  Anti-infectives (From admission, onward)    None      Subjective: Today, patient was seen and examined at bedside.  Appears to be little more alert awake and Communicative.  Oriented to place and month.  Off restraints.  Wants to drink some clears. .  Objective: Vitals:   01/26/22 2036 01/26/22 2138 01/26/22 2325 01/27/22 0906  BP:  (!) 134/92 (!) 141/90 (!) 152/78  Pulse:  77 84 78  Resp:  16 17  18  Temp: 98.7 F (37.1 C) 97.9 F (36.6 C) 98.9 F (37.2 C) 98.2 F (36.8 C)  TempSrc: Axillary Oral Axillary   SpO2:  100% 100% 100%    Intake/Output Summary (Last 24 hours) at 01/27/2022 1035 Last data filed at 01/27/2022 0443 Gross per 24 hour  Intake 1667.56 ml  Output --  Net 1667.56 ml    There were no vitals filed for  this visit.  Physical Examination: There is no height or weight on file to calculate BMI.  General:  Average built, not in obvious distress, alert awake and Communicative, HENT:   No scleral pallor or icterus noted. Oral mucosa is slightly dry Chest:  Clear breath sounds.  No crackles or wheezes.  CVS: S1 &S2 heard. No murmur.  Regular rate and rhythm. Abdomen: Soft, nontender, nondistended.  Bowel sounds are heard.   Extremities: No cyanosis, clubbing or edema.  Peripheral pulses are palpable. Psych: Alert somnolent, was agitated and confused today CNS: Moves all extremities.  Oriented to place and month, somnolent at times. Skin: Warm and dry.  No rashes noted.  Data Reviewed:   CBC: Recent Labs  Lab 01/25/22 0319 01/26/22 0135 01/26/22 0152 01/27/22 0748  WBC 12.0*  --  11.5* 8.4  NEUTROABS 9.1*  --  8.4*  --   HGB 13.7 10.9* 11.6* 12.4  HCT 39.8 32.0* 33.9* 35.9*  MCV 92.3  --  93.9 93.7  PLT 314  --  247 236     Basic Metabolic Panel: Recent Labs  Lab 01/25/22 0319 01/26/22 0042 01/26/22 0135 01/26/22 0152 01/27/22 0748  NA 141  --  137 138 140  K 3.8  --  3.2* 3.0* 3.8  CL 102  --   --  102 105  CO2 22  --   --  23 21*  GLUCOSE 112*  --   --  96 78  BUN 14  --   --  16 11  CREATININE 1.10*  --   --  0.96 0.76  CALCIUM 10.0  --   --  8.9 9.0  MG  --  2.0  --  1.9 1.8     Liver Function Tests: Recent Labs  Lab 01/25/22 0319 01/26/22 0152  AST 24 37  ALT 17 16  ALKPHOS 36* 34*  BILITOT 0.9 1.0  PROT 7.4 6.0*  ALBUMIN 4.4 3.8      Radiology Studies: DG Chest Port 1 View  Result Date: 01/26/2022 CLINICAL DATA:  Acute encephalopathy EXAM: PORTABLE CHEST - 1 VIEW COMPARISON:  02/10/2021 FINDINGS: Examination is limited due to air low depth of inspiration. Cardiomediastinal silhouette and pulmonary vasculature are within normal limits. Increased interstitial markings seen bilaterally possibly due to early pulmonary edema. IMPRESSION: Increased  interstitial markings seen bilaterally possibly due to early pulmonary edema. Electronically Signed   By: Miachel Roux M.D.   On: 01/26/2022 08:16   CT Head Wo Contrast  Result Date: 01/25/2022 CLINICAL DATA:  Intense anxiety following marijuana use, initial encounter EXAM: CT HEAD WITHOUT CONTRAST TECHNIQUE: Contiguous axial images were obtained from the base of the skull through the vertex without intravenous contrast. RADIATION DOSE REDUCTION: This exam was performed according to the departmental dose-optimization program which includes automated exposure control, adjustment of the mA and/or kV according to patient size and/or use of iterative reconstruction technique. COMPARISON:  11/26/2021 FINDINGS: Brain: No evidence of acute infarction, hemorrhage, hydrocephalus, extra-axial collection or mass lesion/mass effect. Chronic atrophic changes and white matter ischemic changes are  again seen. Vascular: No hyperdense vessel or unexpected calcification. Skull: Normal. Negative for fracture or focal lesion. Sinuses/Orbits: No acute finding. Other: None. IMPRESSION: Chronic changes without acute abnormality. Electronically Signed   By: Inez Catalina M.D.   On: 01/25/2022 21:58      LOS: 2 days   Flora Lipps, MD [facility] Available via Epic secure chat 7am-7pm After these hours, please refer to coverage provider listed on [email or web address] 01/27/2022, 10:35 AM

## 2022-01-27 NOTE — Progress Notes (Signed)
Patient has continuously removed telemetry throughout the night. This RN has reconnected patient multiple times to then have patient remove again. Notified on-call provider of refusal.

## 2022-01-27 NOTE — Progress Notes (Signed)
Pt trying to climb out of bed continuously, pt stated "I keep having bad episodes of anxiety, almost panic attacks, I feel like I'm going to lose it" prn medication given with a small improvement in symptoms.

## 2022-01-28 DIAGNOSIS — E860 Dehydration: Secondary | ICD-10-CM | POA: Diagnosis not present

## 2022-01-28 DIAGNOSIS — G9340 Encephalopathy, unspecified: Secondary | ICD-10-CM | POA: Diagnosis not present

## 2022-01-28 DIAGNOSIS — F32A Depression, unspecified: Secondary | ICD-10-CM | POA: Diagnosis not present

## 2022-01-28 DIAGNOSIS — F1010 Alcohol abuse, uncomplicated: Secondary | ICD-10-CM | POA: Diagnosis not present

## 2022-01-28 LAB — BASIC METABOLIC PANEL
Anion gap: 9 (ref 5–15)
BUN: 11 mg/dL (ref 8–23)
CO2: 25 mmol/L (ref 22–32)
Calcium: 8.9 mg/dL (ref 8.9–10.3)
Chloride: 104 mmol/L (ref 98–111)
Creatinine, Ser: 0.8 mg/dL (ref 0.44–1.00)
GFR, Estimated: >60 mL/min (ref >60)
Glucose, Bld: 84 mg/dL (ref 70–99)
Potassium: 3.4 mmol/L — ABNORMAL LOW (ref 3.5–5.1)
Sodium: 138 mmol/L (ref 135–145)

## 2022-01-28 LAB — CBC
HCT: 34.2 % — ABNORMAL LOW (ref 36.0–46.0)
Hemoglobin: 12.2 g/dL (ref 12.0–15.0)
MCH: 32.3 pg (ref 26.0–34.0)
MCHC: 35.7 g/dL (ref 30.0–36.0)
MCV: 90.5 fL (ref 80.0–100.0)
Platelets: 234 10*3/uL (ref 150–400)
RBC: 3.78 MIL/uL — ABNORMAL LOW (ref 3.87–5.11)
RDW: 13.2 % (ref 11.5–15.5)
WBC: 7.9 10*3/uL (ref 4.0–10.5)
nRBC: 0 % (ref 0.0–0.2)

## 2022-01-28 LAB — MAGNESIUM: Magnesium: 2 mg/dL (ref 1.7–2.4)

## 2022-01-28 MED ORDER — BUPROPION HCL ER (SR) 100 MG PO TB12
100.0000 mg | ORAL_TABLET | Freq: Two times a day (BID) | ORAL | Status: DC
  Administered 2022-01-28 – 2022-01-29 (×3): 100 mg via ORAL
  Filled 2022-01-28 (×4): qty 1

## 2022-01-28 MED ORDER — POTASSIUM CHLORIDE CRYS ER 20 MEQ PO TBCR
40.0000 meq | EXTENDED_RELEASE_TABLET | Freq: Once | ORAL | Status: AC
  Administered 2022-01-28: 40 meq via ORAL
  Filled 2022-01-28: qty 2

## 2022-01-28 MED ORDER — BUSPIRONE HCL 10 MG PO TABS
15.0000 mg | ORAL_TABLET | Freq: Three times a day (TID) | ORAL | Status: DC
  Administered 2022-01-28 – 2022-01-29 (×3): 15 mg via ORAL
  Filled 2022-01-28 (×3): qty 2

## 2022-01-28 NOTE — Care Management Important Message (Signed)
Important Message  Patient Details  Name: Terri Ayala MRN: [id number] Date of Birth: November 20, 1955   Medicare Important Message Given:  Yes     Orbie Pyo 01/28/2022, 2:08 PM

## 2022-01-28 NOTE — Progress Notes (Signed)
PROGRESS NOTE    Terri Ayala  JAS:[id number] DOB: 07/17/1955 DOA: 01/25/2022 PCP: Lorrene Reid, PA-C    Brief Narrative:  Terri Ayala is a 66 y.o. female with past medical history significant for generalized anxiety disorder, depression, alcohol abuse, essential hypertension, chronic back pain patient hospitalization on 41/93/7902 with metabolic encephalopathy presented to the hospital again with altered mental status, agitation.  Patient admitted consumption of delta 8 THC Gummies and has previous history of  alcohol abuse with abstinence May 2023.  Patient's family however unsure whether the patient was drinking alcohol again over the last month.  Had alcohol detox done in the past.  Patient was afebrile mildly tachycardic.  CBC showed mild leukocytosis.  Urinalysis was negative.  Creatinine at 1.1.  Urine drug screen was positive for THC.   CT head without any acute findings.  Due to concern for  acute alcohol withdrawal, patient was started on CIWA protocol and also received Phenobarbital 60 mg p.o. x1, Geodon 20 mg IM x1 dose.  Patient was then admitted hospital for further evaluation and treatment.   Assessment and plan.   Acute metabolic encephalopathy:  Etiology unclear.  Likely multifactorial from possibility of mild alcohol withdrawal recent THC usage and possibility of polypharmacy since the patient is on.scheduled baclofen, Wellbutrin, BuSpar, gabapentin, trazodone.  No obvious source of infection at this time.  Urinalysis was negative.  Chest x-ray with increased interstitial markings but no definite infiltrate.  CT head was unremarkable.  Hold off with baclofen.  Restart Wellbutrin and trazodone due to anxiety, procalcitonin less than 0.1.  Patient more alert, awake and medicated but feels anxious.  History of alcohol abuse:  Had undergone detox in May 2023.  Unclear if she continues to drink currently.  Has been started on CIWA protocol  for concerns of ongoing issues.  Continue  thiamine, folic acid, multivitamin.  Continue seizure precautions.   Volume depletion/dehydration with Mild AKI On presentation.  Received IV fluids.  Currently encourage oral fluids.    SIRS: Had mild leukocytosis and tachycardia.  No obvious source of infection at this time.  Urinalysis and chest x-ray unremarkable.  Leukocytosis has improved.   Hypokalemia.  Potassium of 3.4 today.  Will replenish orally.  Check levels in AM.   GAD/depression: Patient is on Wellbutrin, BuSpar, gabapentin.  Hold baclofen and gabapentin for now.  Will restart Wellbutrin, trazodone and buspirone.    Essential Hypertension:  Continue Coreg  Debility, weakness.  Fall precaution.  PT recommends no obvious PT follow-up.  Patient does have caregivers at home.   DVT prophylaxis: enoxaparin (LOVENOX) injection 40 mg Start: 01/26/22 0800   Code Status:     Code Status: Full Code  Disposition: Home with caregivers on 01/29/2022.  Status is: Inpatient  Remains inpatient appropriate because: Metabolic encephalopathy, CIWA protocol,   Family Communication: Spoke with the patient's friend Ms. Heywood Bene on the phone and updated her about the clinical condition of the patient.  Consultants:  None  Procedures:  None  Antimicrobials:  None  Anti-infectives (From admission, onward)    None      Subjective: Today, patient was seen and examined at bedside.  Patient alert awake and Communicative.  Hard of hearing.  Feels very anxious.  Denies any nausea vomiting fever or chills.   Objective: Vitals:   01/28/22 0431 01/28/22 0602 01/28/22 0846 01/28/22 1008  BP: 116/74  115/65 128/74  Pulse: 77  77 71  Resp: 16  18   Temp: 98.4  F (36.9 C)  99.1 F (37.3 C)   TempSrc: Oral  Oral   SpO2: 99%  97%   Weight:  61.1 kg      Intake/Output Summary (Last 24 hours) at 01/28/2022 1131 Last data filed at 01/27/2022 1422 Gross per 24 hour  Intake 120 ml  Output 450 ml  Net -330 ml    Filed  Weights   01/28/22 0602  Weight: 61.1 kg    Physical Examination: Body mass index is 24.64 kg/m.   General: Alert awake and Communicative today, feels anxious. HENT:   No scleral pallor or icterus noted. Oral mucosa moist Chest:  Clear breath sounds.  No crackles or wheezes.  CVS: S1 &S2 heard. No murmur.  Regular rate and rhythm. Abdomen: Soft, nontender, nondistended.  Bowel sounds are heard.   Extremities: No cyanosis, clubbing or edema.  Peripheral pulses are palpable. Psych: Alert awake and Communicative, hard of hearing, CNS: Moves all extremities.  Oriented to place and month, alert awake and Communicative. Skin: Warm and dry.  No rashes noted.  Data Reviewed:   CBC: Recent Labs  Lab 01/25/22 0319 01/26/22 0135 01/26/22 0152 01/27/22 0748 01/28/22 0349  WBC 12.0*  --  11.5* 8.4 7.9  NEUTROABS 9.1*  --  8.4*  --   --   HGB 13.7 10.9* 11.6* 12.4 12.2  HCT 39.8 32.0* 33.9* 35.9* 34.2*  MCV 92.3  --  93.9 93.7 90.5  PLT 314  --  247 236 234     Basic Metabolic Panel: Recent Labs  Lab 01/25/22 0319 01/26/22 0042 01/26/22 0135 01/26/22 0152 01/27/22 0748 01/28/22 0349  NA 141  --  137 138 140 138  K 3.8  --  3.2* 3.0* 3.8 3.4*  CL 102  --   --  102 105 104  CO2 22  --   --  23 21* 25  GLUCOSE 112*  --   --  96 78 84  BUN 14  --   --  '16 11 11  '$ CREATININE 1.10*  --   --  0.96 0.76 0.80  CALCIUM 10.0  --   --  8.9 9.0 8.9  MG  --  2.0  --  1.9 1.8 2.0     Liver Function Tests: Recent Labs  Lab 01/25/22 0319 01/26/22 0152  AST 24 37  ALT 17 16  ALKPHOS 36* 34*  BILITOT 0.9 1.0  PROT 7.4 6.0*  ALBUMIN 4.4 3.8      Radiology Studies: No results found.    LOS: 3 days   Flora Lipps, MD [facility] Available via Epic secure chat 7am-7pm After these hours, please refer to coverage provider listed on [email or web address] 01/28/2022, 11:31 AM

## 2022-01-29 DIAGNOSIS — G9340 Encephalopathy, unspecified: Secondary | ICD-10-CM | POA: Diagnosis not present

## 2022-01-29 DIAGNOSIS — F32A Depression, unspecified: Secondary | ICD-10-CM | POA: Diagnosis not present

## 2022-01-29 DIAGNOSIS — E860 Dehydration: Secondary | ICD-10-CM | POA: Diagnosis not present

## 2022-01-29 DIAGNOSIS — F1010 Alcohol abuse, uncomplicated: Secondary | ICD-10-CM | POA: Diagnosis not present

## 2022-01-29 LAB — BASIC METABOLIC PANEL
Anion gap: 13 (ref 5–15)
BUN: 11 mg/dL (ref 8–23)
CO2: 21 mmol/L — ABNORMAL LOW (ref 22–32)
Calcium: 9.3 mg/dL (ref 8.9–10.3)
Chloride: 104 mmol/L (ref 98–111)
Creatinine, Ser: 0.84 mg/dL (ref 0.44–1.00)
GFR, Estimated: >60 mL/min (ref >60)
Glucose, Bld: 89 mg/dL (ref 70–99)
Potassium: 3.6 mmol/L (ref 3.5–5.1)
Sodium: 138 mmol/L (ref 135–145)

## 2022-01-29 NOTE — Discharge Instructions (Signed)
                   Outpatient Substance Abuse  Treatment  Narcotics Anonymous 24-HOUR HELPLINE Pre-recorded for Meeting Schedules [facility] [phone]  [email or web address] ALCOHOLICS ANONYMOUS  [facility]  Answering Service [phone] Please Note: All [place] Meetings are Non-smoking [email or web address]  Alcohol and Drug Services -  Insurance: Medicaid /State funding/private insurance Methadone, suboxone/Intensive outpatient  [place] [phone] Fax: [phone] [address] [place], [place], [address] [place] [phone] Fax: [phone]    [address], [place], [place], [address] ([address] [place], [place], [place], [place], [place], [place], [place], [place]) [place] [email or web address] Accepts State funding/Medicaid Transitional housing, Intensive Outpatient Treatment, Outpatient treatment, [place]  Phone: [phone] Fax: [phone] Address: [address] [place] [address]   [facility] ([email or web address] Insurance: Medicaid Case Management, Conservation officer, nature, Medication Management, Outpatient Therapy, Psychosocial Rehabilitation, Substance Abuse Intensive Outpatient  Phone: [phone] Fax: [phone] [address], [place], [place], [address]  [facility] Medicaid, most private insurance providers Types of Program: Individual/Group Therapy, Substance Abuse Treatment  Phone: [place] [phone] Fax: [phone] [address], [place], [place], [place], [address] [place] [phone] [address] [place], [place], [address]  [facility]  Medicaid Types of Program: SAIOP  Phone: [phone] Fax: [phone] [place], [place], [place], [address] [facility] Medicaid/state funds Crisis line [phone] [place] ([address]) [place]  [place] [address];  [place], [place] [address] [phone]  Substance Abuse Intensive Outpatient Program [facility] [address] [place], [place] [address] [phone]- Substance abuse treatment  Successful Transitions  Insurance: [facility], BCBS, sliding scale Types of Program: substance abuse treatment, transportation assistance Phone: [phone] Fax: [phone] Address: [address], [place], [place] [address] [facility] [place] ([email or web address]) Insurance: [place], [place], [place], [place] Program: addiction counseling, detoxification,  Phone: [phone]  Fax: [phone] Address: [address] [place], [place] [address]  [facility] (statewide facilities/programs) [address] ([place] funds) [place], [place] [address]                      [email or web address] [phone] [place] [place] (2 Locations) (Medicaid/state funds) --[address]  walk in 8:30-12 and 1-2:30 [place], [id number]   [facility]- [phone] --[place], [place] [address]  [phone] walk in 8:30-12 and 2-3:30  [facility] [place] state funds/medicaid [place], [place] [address] [phone] [facility] (Suboxone clinic) Medicaid/state funds  [address]  [place], [place] [address] [phone]   [place], [place], [place] [address]  [phone] (24 hours) [place], [place] [address]  [phone] (24 hours) [place]- [place]- [address] [place] Alert [place]- [address] [place] [phone] [facility]- Medicaid and state funds  [place] [address], [place] [address] [phone] (24 hours) [place]- [address] [place], [place] [address]  [phone] [facility]- [address] [place], [place] [address] [phone] (24 hours) [place] [address] [place], [place]  [address] [phone] [address] [place] [phone]

## 2022-01-29 NOTE — Progress Notes (Signed)
Physical Therapy Treatment Patient Details Name: Terri Ayala MRN: [id number] DOB: August 03, 1955 Today's Date: 01/29/2022   History of Present Illness 66 y.o. female with medical history significant for generalized anxiety disorder, depression, alcohol abuse, essential hypertension, chronic back pain, who is admitted to [facility] on 01/25/2022 with acute encephalopathy.    PT Comments    Pt is progressing well towards goals. She increased ambulation distance and negotiated steps. Noted several LOB during gait, pt able to self correct, but she will need supervision at d/c for safety. She states that she has family and friends that can check in on her several times a day. D/c expected today.     Recommendations for follow up therapy are one component of a multi-disciplinary discharge planning process, led by the attending physician.  Recommendations may be updated based on patient status, additional functional criteria and insurance authorization.  Follow Up Recommendations  No PT follow up     Assistance Recommended at Discharge Frequent or constant Supervision/Assistance  Patient can return home with the following A little help with walking and/or transfers;A little help with bathing/dressing/bathroom;Assistance with cooking/housework;Direct supervision/assist for financial management;Direct supervision/assist for medications management;Assist for transportation;Help with stairs or ramp for entrance   Equipment Recommendations  None recommended by PT    Recommendations for Other Services       Precautions / Restrictions Precautions Precautions: Fall Restrictions Weight Bearing Restrictions: No     Mobility  Bed Mobility Overal bed mobility: Needs Assistance Bed Mobility: Supine to Sit, Sit to Supine     Supine to sit: Supervision Sit to supine: Supervision   General bed mobility comments: supervision for safety    Transfers Overall transfer level: Needs  assistance Equipment used: None Transfers: Sit to/from Stand Sit to Stand: Min guard           General transfer comment: no physical assist; guarding for safety    Ambulation/Gait Ambulation/Gait assistance: Min guard Gait Distance (Feet): 150 Feet Assistive device: None Gait Pattern/deviations: Step-through pattern, Decreased stride length, Drifts right/left       General Gait Details: Pt able to ambulate in hallway with very close min guard. Pt blinking frequently with c/o dizziness that improved but never fully resolved. Occasional LOB, however pt was able to correct w/o assist. Reaching for stability from furniture in room.   Stairs Stairs: Yes Stairs assistance: Min guard Stair Management: Two rails, Forwards, Alternating pattern Number of Stairs: 2 General stair comments: pt negotiated steps with use of hand rails. Some hesistation with initiating steps, but did well once she started.   Wheelchair Mobility    Modified Rankin (Stroke Patients Only)       Balance Overall balance assessment: Mild deficits observed, not formally tested                                          Cognition Arousal/Alertness: Awake/alert (sleeping on arrival; improved throughout session) Behavior During Therapy: WFL for tasks assessed/performed Overall Cognitive Status: No family/caregiver present to determine baseline cognitive functioning                                 General Comments: Overall improved cognition. Able to think through getting home and the challanges she may face once there.        Exercises  General Comments General comments (skin integrity, edema, etc.): Able to use restroom and perform peri care w/o assist.      Pertinent Vitals/Pain      Home Living                          Prior Function            PT Goals (current goals can now be found in the care plan section) Acute Rehab PT Goals PT Goal  Formulation: Patient unable to participate in goal setting Time For Goal Achievement: 02/10/22 Potential to Achieve Goals: Good Progress towards PT goals: Progressing toward goals    Frequency    Min 3X/week      PT Plan Current plan remains appropriate    Co-evaluation              AM-PAC PT "6 Clicks" Mobility   Outcome Measure  Help needed turning from your back to your side while in a flat bed without using bedrails?: None Help needed moving from lying on your back to sitting on the side of a flat bed without using bedrails?: A Little Help needed moving to and from a bed to a chair (including a wheelchair)?: A Little Help needed standing up from a chair using your arms (e.g., wheelchair or bedside chair)?: A Little Help needed to walk in hospital room?: A Little Help needed climbing 3-5 steps with a railing? : A Little 6 Click Score: 19    End of Session Equipment Utilized During Treatment: Gait belt Activity Tolerance: Patient tolerated treatment well Patient left: in bed;with call bell/phone within reach;with bed alarm set Nurse Communication: Mobility status (pt missing purse and in need of a ride home.) PT Visit Diagnosis: Difficulty in walking, not elsewhere classified (R26.2)     Time: 1245-8099 PT Time Calculation (min) (ACUTE ONLY): 29 min  Charges:  $Gait Training: 23-37 mins                     Benjiman Core, PTA Acute Rehab   Allena Katz 01/29/2022, 10:11 AM

## 2022-01-29 NOTE — Discharge Summary (Signed)
Physician Discharge Summary   Patient: Terri Ayala MRN: [id number] DOB: 04/11/1955  Admit date:     01/25/2022  Discharge date: 01/29/22  Discharge Physician: Flora Lipps   PCP: Lorrene Reid, PA-C   Recommendations at discharge:   Follow-up with your primary care provider in 1 week.   Check CBC BMP magnesium in the next visit  Discharge Diagnoses: Active Problems:   Essential hypertension   GAD (generalized anxiety disorder)   Depression   Alcohol abuse  Principal Problem (Resolved):   Acute encephalopathy Resolved Problems:   SIRS (systemic inflammatory response syndrome) ([place])   Dehydration  Hospital Course: Terri Ayala is a 66 y.o. female with past medical history significant for generalized anxiety disorder, depression, alcohol abuse, essential hypertension, chronic back pain patient hospitalization on 34/74/2595 with metabolic encephalopathy presented to the hospital again with altered mental status agitation.  Patient admitted consumption of delta 8 THC Gummies and has previous history of  alcohol abuse with abstinence May 2023.  Patient's family however unsure whether the patient was drinking alcohol again over the last month.  Had alcohol detox done in the past.  Patient was afebrile mildly tachycardic.  CBC showed mild leukocytosis.  Urinalysis was negative.  Creatinine at 1.1.  Urine drug screen was positive for THC.   CT head without any acute findings.  Due to concern for  acute alcohol withdrawal patient was started on CIWA protocol and also received Phenobarbital 60 mg p.o. x1, Geodon 20 mg IM x1 dose.  Patient was then admitted hospital for further evaluation and treatment.   Following conditions were addressed during hospitalization,   Acute metabolic encephalopathy: Etiology unclear.  Likely multifactorial from possibility of mild alcohol withdrawal recent THC usage and possibility of polypharmacy since the patient was on scheduled baclofen, Wellbutrin,  BuSpar, gabapentin, trazodone.  No obvious source of infection at this time.  Urinalysis was negative.  Chest x-ray with increased interstitial markings but no definite infiltrate.  No mention of seizures.  CT head was unremarkable.   Procalcitonin less than 0.1.  Initially required a sitter and restraints.  At this time patient is much alert awake and communicated likely at her baseline.  Was encouraged to ensure that she was taking her medications correctly.  Discussed against THC Gummies and alcohol.  History of alcohol abuse:  Had undergone detox in May 2023. [name]  Continue thiamine, folic acid, multivitamin.  .   Volume depletion/dehydration  On presentation.  Resolved with hydration   SIRS: Had mild leukocytosis and tachycardia.  No obvious source of infection at this time.  Urinalysis and chest x-ray unremarkable.  Section has been ruled out    GAD/depression: Patient is on Wellbutrin, BuSpar, gabapentin.  Will hold baclofen on discharge.  Counseled against being careful on taking medication    Essential Hypertension:  On Coreg as outpatient.  Will be resumed on discharge     Consultants: None  Procedures performed: None  Disposition: Home.  Spoke with the patient's friend Ms Heywood Bene who is the primary contact for the patient.  Diet recommendation:  Discharge Diet Orders (From admission, onward)     Start     Ordered   01/29/22 0000  Diet general        01/29/22 0925           Regular diet DISCHARGE MEDICATION: Allergies as of 01/29/2022       Reactions   Doxycycline Swelling   Mouth swelling and sores   Codeine Itching  Tetracycline Hcl Swelling   Mouth swelling and sores        Medication List     STOP taking these medications    Afluria Quadrivalent 0.5 ML injection Generic drug: influenza vac split quadrivalent PF   baclofen 10 MG tablet Commonly known as: LIORESAL   lidocaine 5 % Commonly known as: LIDODERM   metroNIDAZOLE 500 MG  tablet Commonly known as: FLAGYL   nitrofurantoin (macrocrystal-monohydrate) 100 MG capsule Commonly known as: MACROBID   Prevnar 13 Susp injection Generic drug: pneumococcal 13-valent conjugate vaccine   Shingrix injection Generic drug: Zoster Vaccine Adjuvanted   Zostavax 19400 UNT/0.65ML injection Generic drug: Zoster Vaccine Live (PF)       TAKE these medications    acetaminophen 500 MG tablet Commonly known as: TYLENOL Take 500 mg by mouth every 8 (eight) hours as needed.   ADULT GUMMY PO Take 2 capsules by mouth daily.   albuterol 108 (90 Base) MCG/ACT inhaler Commonly known as: VENTOLIN HFA INHALE 2 PUFFS INTO THE LUNGS EVERY 6 HOURS AS NEEDED FOR WHEEZING OR SHORTNESS OF BREATH.   alendronate 70 MG tablet Commonly known as: FOSAMAX Take 70 mg by mouth every Monday.   ampicillin 500 MG capsule Commonly known as: PRINCIPEN Take 500 mg by mouth daily.   Azelaic Acid 15 % gel   buPROPion ER 100 MG 12 hr tablet Commonly known as: Wellbutrin SR Take 1 tablet (100 mg total) by mouth 2 (two) times daily.   busPIRone 15 MG tablet Commonly known as: BUSPAR Take 1 tablet (15 mg total) by mouth 3 (three) times daily.   CALCIUM 600+D PO Take 1 tablet by mouth daily.   carvedilol 6.25 MG tablet Commonly known as: COREG Take 1 tablet (6.25 mg total) by mouth 2 (two) times daily with a meal.   Clenpiq 10-3.5-12 MG-GM -GM/175ML Soln Generic drug: Sod Picosulfate-Mag Ox-Cit Acd SMARTSIG:175 Milliliter(s) By Mouth Twice a Week   clindamycin 1 % gel Commonly known as: CLINDAGEL   DERMAREST ROSACEA EX Apply topically Once daily for 30   diclofenac Sodium 1 % Gel Commonly known as: VOLTAREN Apply 2 g 4 times a day by topical route.   Flovent HFA 110 MCG/ACT inhaler Generic drug: fluticasone 2 puff(s) Puff and swallow/ not for asthma, for EOE 2 times a day for 30 day(s)   fluocinonide 0.05 % external solution Commonly known as: LIDEX   fluticasone 50  MCG/ACT nasal spray Commonly known as: FLONASE Place 2 sprays into both nostrils daily as needed for allergies.   folic acid 1 MG tablet Commonly known as: FOLVITE TAKE 1 TABLET BY MOUTH DAILY.   gabapentin 300 MG capsule Commonly known as: NEURONTIN Take 1 capsule twice a day by oral route.   hydrocortisone 2.5 % cream hydrocortisone 2.5 % topical cream  APPLY TO AFFECTED AREA TWICE A DAY FOR 14 DAYS   ketoconazole 2 % shampoo Commonly known as: NIZORAL APPLY 5 MLS TO AFFECTED AREA 2 TO 3 TIMES A WEEK for 30   meloxicam 15 MG tablet Commonly known as: MOBIC Take 1 tablet (15 mg total) by mouth daily as needed for pain.   norethindrone-ethinyl estradiol 1-5 MG-MCG Tabs tablet Commonly known as: FEMHRT 1/5 Take 1 tablet by mouth daily.   ondansetron 8 MG disintegrating tablet Commonly known as: ZOFRAN-ODT   pantoprazole 40 MG tablet Commonly known as: PROTONIX TAKE 1 TABLET BY MOUTH 2 TIMES DAILY.   SUMAtriptan 100 MG tablet Commonly known as: Imitrex Take 1 tablet (  100 mg total) by mouth every 2 (two) hours as needed for migraine. May repeat in 2 hours if headache persists or recurs.   thiamine 100 MG tablet Commonly known as: Vitamin B-1 Take 1 tablet every day by oral route.   traZODone 50 MG tablet Commonly known as: DESYREL Take 50 mg by mouth at bedtime.   triamcinolone cream 0.1 % Commonly known as: KENALOG APPLY TO AFFECTED AREA TWICE A DAY       Subjective Today, patient was seen and examined at bedside.  Denies any nausea, vomiting fever, chills or rigor.  Discharge Exam: Filed Weights   01/28/22 0602 01/29/22 0457  Weight: 61.1 kg 60.3 kg   General:  Average built, not in obvious distress, feels anxious. HENT:   No scleral pallor or icterus noted. Oral mucosa is moist.  Chest:  Clear breath sounds.  Diminished breath sounds bilaterally. No crackles or wheezes.  CVS: S1 &S2 heard. No murmur.  Regular rate and rhythm. Abdomen: Soft, nontender,  nondistended.  Bowel sounds are heard.   Extremities: No cyanosis, clubbing or edema.  Peripheral pulses are palpable. Psych: Alert, awake and Communicative, hard of hearing CNS:  No cranial nerve deficits.  Oriented to place and month, Skin: Warm and dry.  No rashes noted.  Condition at discharge: good  The results of significant diagnostics from this hospitalization (including imaging, microbiology, ancillary and laboratory) are listed below for reference.   Imaging Studies: DG Chest Port 1 View  Result Date: 01/26/2022 CLINICAL DATA:  Acute encephalopathy EXAM: PORTABLE CHEST - 1 VIEW COMPARISON:  02/10/2021 FINDINGS: Examination is limited due to air low depth of inspiration. Cardiomediastinal silhouette and pulmonary vasculature are within normal limits. Increased interstitial markings seen bilaterally possibly due to early pulmonary edema. IMPRESSION: Increased interstitial markings seen bilaterally possibly due to early pulmonary edema. Electronically Signed   By: Miachel Roux M.D.   On: 01/26/2022 08:16   CT Head Wo Contrast  Result Date: 01/25/2022 CLINICAL DATA:  Intense anxiety following marijuana use, initial encounter EXAM: CT HEAD WITHOUT CONTRAST TECHNIQUE: Contiguous axial images were obtained from the base of the skull through the vertex without intravenous contrast. RADIATION DOSE REDUCTION: This exam was performed according to the departmental dose-optimization program which includes automated exposure control, adjustment of the mA and/or kV according to patient size and/or use of iterative reconstruction technique. COMPARISON:  11/26/2021 FINDINGS: Brain: No evidence of acute infarction, hemorrhage, hydrocephalus, extra-axial collection or mass lesion/mass effect. Chronic atrophic changes and white matter ischemic changes are again seen. Vascular: No hyperdense vessel or unexpected calcification. Skull: Normal. Negative for fracture or focal lesion. Sinuses/Orbits: No acute  finding. Other: None. IMPRESSION: Chronic changes without acute abnormality. Electronically Signed   By: Inez Catalina M.D.   On: 01/25/2022 21:58    Microbiology: Results for orders placed or performed during the hospital encounter of 07/22/21  Surgical pcr screen     Status: None   Collection Time: 07/22/21  7:34 AM   Specimen: Nasal Mucosa; Nasal Swab  Result Value Ref Range Status   MRSA, PCR NEGATIVE NEGATIVE Final   Staphylococcus aureus NEGATIVE NEGATIVE Final    Comment: (NOTE) The Xpert SA Assay (FDA approved for NASAL specimens in patients 57 years of age and older), is one component of a comprehensive surveillance program. It is not intended to diagnose infection nor to guide or monitor treatment. Performed at [facility], [place] [address], [place], [place] [address]     Labs: CBC:  Recent Labs  Lab 01/25/22 0319 01/26/22 0135 01/26/22 0152 01/27/22 0748 01/28/22 0349  WBC 12.0*  --  11.5* 8.4 7.9  NEUTROABS 9.1*  --  8.4*  --   --   HGB 13.7 10.9* 11.6* 12.4 12.2  HCT 39.8 32.0* 33.9* 35.9* 34.2*  MCV 92.3  --  93.9 93.7 90.5  PLT 314  --  247 236 665   Basic Metabolic Panel: Recent Labs  Lab 01/25/22 0319 01/26/22 0042 01/26/22 0135 01/26/22 0152 01/27/22 0748 01/28/22 0349 01/29/22 0300  NA 141  --  137 138 140 138 138  K 3.8  --  3.2* 3.0* 3.8 3.4* 3.6  CL 102  --   --  102 105 104 104  CO2 22  --   --  23 21* 25 21*  GLUCOSE 112*  --   --  96 78 84 89  BUN 14  --   --  '16 11 11 11  '$ CREATININE 1.10*  --   --  0.96 0.76 0.80 0.84  CALCIUM 10.0  --   --  8.9 9.0 8.9 9.3  MG  --  2.0  --  1.9 1.8 2.0  --    Liver Function Tests: Recent Labs  Lab 01/25/22 0319 01/26/22 0152  AST 24 37  ALT 17 16  ALKPHOS 36* 34*  BILITOT 0.9 1.0  PROT 7.4 6.0*  ALBUMIN 4.4 3.8   CBG: No results for input(s): "GLUCAP" in the last 168 hours.  Discharge time spent: greater than 30 minutes.  Signed: Flora Lipps, MD [facility] 01/29/2022

## 2022-01-31 ENCOUNTER — Telehealth: Payer: Self-pay

## 2022-01-31 NOTE — Telephone Encounter (Unsigned)
Transition Care Management Unsuccessful Follow-up Telephone Call  Date of discharge and from where:  [facility] 01/29/2022  Attempts:  1st Attempt  Reason for unsuccessful TCM follow-up call:  Left voice message Juanda Crumble, [place] Direct Dial [phone]

## 2022-02-01 NOTE — Telephone Encounter (Signed)
Transition Care Management Follow-up Telephone Call Date of discharge and from where: [facility] 01/29/2022 How have you been since you were released from the hospital? good Any questions or concerns? No  Items Reviewed: Did the pt receive and understand the discharge instructions provided? Yes  Medications obtained and verified? Yes  Other? No  Any new allergies since your discharge? No  Dietary orders reviewed? Yes Do you have support at home? Yes   Home Care and Equipment/Supplies: Were home health services ordered? no If so, what is the name of the agency? N/a  Has the agency set up a time to come to the patient's home? not applicable Were any new equipment or medical supplies ordered?  No What is the name of the medical supply agency? N/a Were you able to get the supplies/equipment? not applicable Do you have any questions related to the use of the equipment or supplies? No  Functional Questionnaire: (I = Independent and D = Dependent) ADLs: I  Bathing/Dressing- I  Meal Prep- I  Eating- I  Maintaining continence- I  Transferring/Ambulation- I  Managing Meds- I  Follow up appointments reviewed:  PCP Hospital f/u appt confirmed? No  no avail time slots will send message to staff to schedule Specialist Hospital f/u appt confirmed? No   Are transportation arrangements needed? No  If their condition worsens, is the pt aware to call PCP or go to the Emergency Dept.? Yes Was the patient provided with contact information for the PCP's office or ED? Yes Was to pt encouraged to call back with questions or concerns? Yes  .lh

## 2022-02-15 ENCOUNTER — Inpatient Hospital Stay (HOSPITAL_COMMUNITY)
Admission: EM | Admit: 2022-02-15 | Discharge: 2022-02-25 | DRG: 917 | Disposition: A | Discharge status: Home / Self Care | Payer: Medicare Other | Attending: Internal Medicine | Admitting: Internal Medicine

## 2022-02-15 ENCOUNTER — Encounter (HOSPITAL_COMMUNITY): Payer: Self-pay

## 2022-02-15 DIAGNOSIS — G928 Other toxic encephalopathy: Secondary | ICD-10-CM | POA: Diagnosis present

## 2022-02-15 DIAGNOSIS — Z781 Physical restraint status: Secondary | ICD-10-CM

## 2022-02-15 DIAGNOSIS — E785 Hyperlipidemia, unspecified: Secondary | ICD-10-CM | POA: Diagnosis present

## 2022-02-15 DIAGNOSIS — E876 Hypokalemia: Secondary | ICD-10-CM | POA: Diagnosis present

## 2022-02-15 DIAGNOSIS — N809 Endometriosis, unspecified: Secondary | ICD-10-CM | POA: Diagnosis present

## 2022-02-15 DIAGNOSIS — E872 Acidosis, unspecified: Secondary | ICD-10-CM | POA: Diagnosis present

## 2022-02-15 DIAGNOSIS — F1510 Other stimulant abuse, uncomplicated: Secondary | ICD-10-CM | POA: Diagnosis present

## 2022-02-15 DIAGNOSIS — G8929 Other chronic pain: Secondary | ICD-10-CM | POA: Diagnosis present

## 2022-02-15 DIAGNOSIS — Z888 Allergy status to other drugs, medicaments and biological substances status: Secondary | ICD-10-CM

## 2022-02-15 DIAGNOSIS — T50911A Poisoning by multiple unspecified drugs, medicaments and biological substances, accidental (unintentional), initial encounter: Secondary | ICD-10-CM | POA: Diagnosis not present

## 2022-02-15 DIAGNOSIS — Z800 Family history of malignant neoplasm of digestive organs: Secondary | ICD-10-CM

## 2022-02-15 DIAGNOSIS — M545 Low back pain, unspecified: Secondary | ICD-10-CM | POA: Diagnosis present

## 2022-02-15 DIAGNOSIS — F29 Unspecified psychosis not due to a substance or known physiological condition: Secondary | ICD-10-CM | POA: Diagnosis present

## 2022-02-15 DIAGNOSIS — Z7983 Long term (current) use of bisphosphonates: Secondary | ICD-10-CM

## 2022-02-15 DIAGNOSIS — K219 Gastro-esophageal reflux disease without esophagitis: Secondary | ICD-10-CM | POA: Diagnosis present

## 2022-02-15 DIAGNOSIS — M810 Age-related osteoporosis without current pathological fracture: Secondary | ICD-10-CM | POA: Diagnosis present

## 2022-02-15 DIAGNOSIS — R410 Disorientation, unspecified: Secondary | ICD-10-CM

## 2022-02-15 DIAGNOSIS — R4182 Altered mental status, unspecified: Secondary | ICD-10-CM | POA: Diagnosis not present

## 2022-02-15 DIAGNOSIS — F1210 Cannabis abuse, uncomplicated: Secondary | ICD-10-CM | POA: Diagnosis present

## 2022-02-15 DIAGNOSIS — F32A Depression, unspecified: Secondary | ICD-10-CM | POA: Diagnosis present

## 2022-02-15 DIAGNOSIS — R443 Hallucinations, unspecified: Secondary | ICD-10-CM | POA: Diagnosis not present

## 2022-02-15 DIAGNOSIS — F05 Delirium due to known physiological condition: Secondary | ICD-10-CM | POA: Diagnosis present

## 2022-02-15 DIAGNOSIS — G9341 Metabolic encephalopathy: Secondary | ICD-10-CM | POA: Diagnosis present

## 2022-02-15 DIAGNOSIS — F19131 Other psychoactive substance abuse with withdrawal delirium: Secondary | ICD-10-CM | POA: Diagnosis present

## 2022-02-15 DIAGNOSIS — Z87891 Personal history of nicotine dependence: Secondary | ICD-10-CM

## 2022-02-15 DIAGNOSIS — Z881 Allergy status to other antibiotic agents status: Secondary | ICD-10-CM

## 2022-02-15 DIAGNOSIS — I10 Essential (primary) hypertension: Secondary | ICD-10-CM | POA: Diagnosis present

## 2022-02-15 DIAGNOSIS — F411 Generalized anxiety disorder: Secondary | ICD-10-CM | POA: Diagnosis present

## 2022-02-15 DIAGNOSIS — Z885 Allergy status to narcotic agent status: Secondary | ICD-10-CM

## 2022-02-15 DIAGNOSIS — F1010 Alcohol abuse, uncomplicated: Secondary | ICD-10-CM | POA: Diagnosis present

## 2022-02-15 DIAGNOSIS — Z79899 Other long term (current) drug therapy: Secondary | ICD-10-CM

## 2022-02-15 DIAGNOSIS — G43909 Migraine, unspecified, not intractable, without status migrainosus: Secondary | ICD-10-CM | POA: Diagnosis present

## 2022-02-15 DIAGNOSIS — Z8262 Family history of osteoporosis: Secondary | ICD-10-CM | POA: Diagnosis not present

## 2022-02-15 DIAGNOSIS — R451 Restlessness and agitation: Secondary | ICD-10-CM | POA: Diagnosis not present

## 2022-02-15 DIAGNOSIS — R7401 Elevation of levels of liver transaminase levels: Secondary | ICD-10-CM | POA: Diagnosis present

## 2022-02-15 DIAGNOSIS — D72829 Elevated white blood cell count, unspecified: Secondary | ICD-10-CM | POA: Diagnosis present

## 2022-02-15 DIAGNOSIS — F419 Anxiety disorder, unspecified: Secondary | ICD-10-CM | POA: Diagnosis not present

## 2022-02-15 DIAGNOSIS — I471 Supraventricular tachycardia, unspecified: Secondary | ICD-10-CM | POA: Diagnosis not present

## 2022-02-15 DIAGNOSIS — F410 Panic disorder [episodic paroxysmal anxiety] without agoraphobia: Secondary | ICD-10-CM | POA: Diagnosis present

## 2022-02-15 DIAGNOSIS — Z882 Allergy status to sulfonamides status: Secondary | ICD-10-CM

## 2022-02-15 LAB — CBC WITH DIFFERENTIAL/PLATELET
Abs Immature Granulocytes: 0.03 10*3/uL (ref 0.00–0.07)
Basophils Absolute: 0.1 10*3/uL (ref 0.0–0.1)
Basophils Relative: 1 %
Eosinophils Absolute: 0.2 10*3/uL (ref 0.0–0.5)
Eosinophils Relative: 2 %
HCT: 41.6 % (ref 36.0–46.0)
Hemoglobin: 13.7 g/dL (ref 12.0–15.0)
Immature Granulocytes: 0 %
Lymphocytes Relative: 14 %
Lymphs Abs: 1.3 10*3/uL (ref 0.7–4.0)
MCH: 31.4 pg (ref 26.0–34.0)
MCHC: 32.9 g/dL (ref 30.0–36.0)
MCV: 95.4 fL (ref 80.0–100.0)
Monocytes Absolute: 0.6 10*3/uL (ref 0.1–1.0)
Monocytes Relative: 6 %
Neutro Abs: 7 10*3/uL (ref 1.7–7.7)
Neutrophils Relative %: 77 %
Platelets: 191 10*3/uL (ref 150–400)
RBC: 4.36 MIL/uL (ref 3.87–5.11)
RDW: 13.7 % (ref 11.5–15.5)
WBC: 9.2 10*3/uL (ref 4.0–10.5)
nRBC: 0 % (ref 0.0–0.2)

## 2022-02-15 LAB — URINALYSIS, ROUTINE W REFLEX MICROSCOPIC
Bacteria, UA: NONE SEEN
Bilirubin Urine: NEGATIVE
Glucose, UA: NEGATIVE mg/dL
Hgb urine dipstick: NEGATIVE
Ketones, ur: 20 mg/dL — AB
Nitrite: NEGATIVE
Protein, ur: NEGATIVE mg/dL
Specific Gravity, Urine: 1.006 (ref 1.005–1.030)
pH: 7 (ref 5.0–8.0)

## 2022-02-15 LAB — RAPID URINE DRUG SCREEN, HOSP PERFORMED
Amphetamines: NOT DETECTED
Barbiturates: NOT DETECTED
Benzodiazepines: POSITIVE — AB
Cocaine: NOT DETECTED
Opiates: NOT DETECTED
Tetrahydrocannabinol: POSITIVE — AB

## 2022-02-15 LAB — COMPREHENSIVE METABOLIC PANEL
ALT: 168 U/L — ABNORMAL HIGH (ref 0–44)
AST: 159 U/L — ABNORMAL HIGH (ref 15–41)
Albumin: 4.3 g/dL (ref 3.5–5.0)
Alkaline Phosphatase: 55 U/L (ref 38–126)
Anion gap: 15 (ref 5–15)
BUN: 13 mg/dL (ref 8–23)
CO2: 16 mmol/L — ABNORMAL LOW (ref 22–32)
Calcium: 10.1 mg/dL (ref 8.9–10.3)
Chloride: 108 mmol/L (ref 98–111)
Creatinine, Ser: 0.89 mg/dL (ref 0.44–1.00)
GFR, Estimated: >60 mL/min (ref >60)
Glucose, Bld: 97 mg/dL (ref 70–99)
Potassium: 3.4 mmol/L — ABNORMAL LOW (ref 3.5–5.1)
Sodium: 139 mmol/L (ref 135–145)
Total Bilirubin: 0.9 mg/dL (ref 0.3–1.2)
Total Protein: 7.7 g/dL (ref 6.5–8.1)

## 2022-02-15 MED ORDER — LORAZEPAM 2 MG/ML IJ SOLN
1.0000 mg | Freq: Once | INTRAMUSCULAR | Status: AC
  Administered 2022-02-15: 1 mg via INTRAVENOUS
  Filled 2022-02-15: qty 1

## 2022-02-15 MED ORDER — PHENOBARBITAL 32.4 MG PO TABS
64.8000 mg | ORAL_TABLET | Freq: Once | ORAL | Status: AC
  Administered 2022-02-16: 64.8 mg via ORAL
  Filled 2022-02-15: qty 2

## 2022-02-15 MED ORDER — MIDAZOLAM HCL 2 MG/2ML IJ SOLN
2.0000 mg | Freq: Once | INTRAMUSCULAR | Status: AC
  Administered 2022-02-15: 2 mg via INTRAVENOUS
  Filled 2022-02-15: qty 2

## 2022-02-15 MED ORDER — CHLORHEXIDINE GLUCONATE CLOTH 2 % EX PADS
6.0000 | MEDICATED_PAD | Freq: Every day | CUTANEOUS | Status: DC
  Administered 2022-02-16 – 2022-02-17 (×2): 6 via TOPICAL

## 2022-02-15 MED ORDER — HALOPERIDOL LACTATE 5 MG/ML IJ SOLN
5.0000 mg | Freq: Once | INTRAMUSCULAR | Status: AC
  Administered 2022-02-15: 5 mg via INTRAVENOUS
  Filled 2022-02-15: qty 1

## 2022-02-15 MED ORDER — ORAL CARE MOUTH RINSE: 15.0000 mL | OROMUCOSAL | Status: DC | PRN

## 2022-02-15 MED ORDER — LORAZEPAM 1 MG PO TABS
2.0000 mg | ORAL_TABLET | Freq: Once | ORAL | Status: AC
  Administered 2022-02-15: 2 mg via ORAL
  Filled 2022-02-15: qty 2

## 2022-02-15 MED ORDER — MIDAZOLAM HCL 2 MG/2ML IJ SOLN
1.0000 mg | INTRAMUSCULAR | Status: DC | PRN
  Administered 2022-02-15 – 2022-02-16 (×2): 2 mg via INTRAVENOUS
  Filled 2022-02-15 (×2): qty 2

## 2022-02-15 NOTE — ED Notes (Signed)
Dr. Nevada Crane (inpatient attending) notified that pt will not keep her cardiac monitoring devices on, NT and nurse are unable to get vitals on pt d/t her agitation

## 2022-02-15 NOTE — ED Notes (Signed)
Pt has removed her pants and socks, pt is trying to get out her soft belt restraint at this time, waiting on pharmacy to verify PHENObarbital (LUMINAL) so it may be administer.

## 2022-02-15 NOTE — ED Notes (Signed)
Pt continues to be restless, pt shouts and verbally loud, pt and another hall pt across the hall are interacting verbally loud which is making them both more irritated. Pt tried to get out of restraints and throws her leg over bed rail, pt still is not redirectable. Dr. Kathrynn Humble made aware that pt is more agitated, shouting out, and anxious. Pt confused, talking about getting her dog bella, st "don't make me go to the kitchen", and talking about shopping at [employer].

## 2022-02-15 NOTE — Progress Notes (Signed)
eLink Physician-Brief Progress Note Patient Name: RAVENNA LEGORE DOB: 1955-08-15 MRN: [id number]   Date of Service  02/15/2022  HPI/Events of Note  Severe Agitation - Nursing request for 4 point restraints and sedation until patient seen at bedside and evaluated by the PCCM ground team.   eICU Interventions  Plan: Soft waist belt and 4-point soft restraints X 10 hours. Versed 1-2 mg IV Q 2 hours PRN severe agitation.      Intervention Category Major Interventions: Delirium, psychosis, severe agitation - evaluation and management  Blonnie Maske [place] 02/15/2022, 11:22 PM

## 2022-02-15 NOTE — ED Notes (Signed)
Pt has continued to climb over bed rails even after MULTIPLE staff members have tried to redirect pt, Dr. Kathrynn Humble has been to talk and reassess pt. Pt has stumbled several times while on her feet, pt has been educated about safety and staying bed trying to prevent falls, pt is hard to redirect, pt is confused. Dr. Kathrynn Humble to place order for non-violent soft restraint, as pt has pulled out one IV after the IV was placed, pt continues to get out of bed, unable to obtain EKG as pt is not cooperative, and need for additional IV to be placed per MD orders.

## 2022-02-15 NOTE — ED Notes (Signed)
IV access has been attempted 3 times, unsuccessfully. Unable to get blood for ethanol test at this time

## 2022-02-15 NOTE — ED Provider Notes (Addendum)
Terri Ayala   CSN: [id number] Arrival date & time: 02/15/22  1555     History  Chief Complaint  Patient presents with   Panic Attack    Terri Ayala is a 66 y.o. female.  HPI    66 y.o. female with past medical history significant for generalized anxiety disorder, depression, alcohol abuse, essential hypertension, chronic back pain, with metabolic encephalopathy history requiring admission in November and earlier in December presents to the ER with chief complaint of agitation and panic attacks.  Patient not able to provide meaningful history.  She admits to using some delta 8 Gummies today.  She states that she got them from her friends.  She admits to taking all her medications as prescribed.  She feels restless and admits to having hallucinations both audio and visual.  She feels unsteady.  I spoke with patient's friend Ms. Heywood Bene, who is actually working for the caregiver agency and also patient's brother Mr. Reilley Valentine who has POA. Ms. Renard Hamper states that she started taking care of patient 2 weeks ago after a break. She has been noted to be anxious for the entire time, but has intermittent episodes. Today the patient herself decided to call 911.  Brother indicates that pt has history of alcoholism and was doing well until recently. Pt was admitted for ankle issues earlier in the year and started taking delta gummies, for which she needed admission.   Home Medications Prior to Admission medications   Medication Sig Start Date End Date Taking? Authorizing Provider  acetaminophen (TYLENOL) 500 MG tablet Take 500 mg by mouth every 8 (eight) hours as needed.    [provider]  albuterol (VENTOLIN HFA) 108 (90 Base) MCG/ACT inhaler INHALE 2 PUFFS INTO THE LUNGS EVERY 6 HOURS AS NEEDED FOR WHEEZING OR SHORTNESS OF BREATH. 07/02/21   Lorrene Reid, PA-C  alendronate (FOSAMAX) 70 MG tablet Take 70 mg by mouth every Monday.     [provider]  ampicillin (PRINCIPEN) 500 MG capsule Take 500 mg by mouth daily. 09/03/21   [provider]  Azelaic Acid 15 % gel     [provider]  buPROPion ER (WELLBUTRIN SR) 100 MG 12 hr tablet Take 1 tablet (100 mg total) by mouth 2 (two) times daily. 12/17/21 03/17/22  Dara Hoyer, PA-C  busPIRone (BUSPAR) 15 MG tablet Take 1 tablet (15 mg total) by mouth 3 (three) times daily. 12/17/21   Dara Hoyer, PA-C  Calcium Carbonate-Vitamin D (CALCIUM 600+D PO) Take 1 tablet by mouth daily.    [provider]  carvedilol (COREG) 6.25 MG tablet Take 1 tablet (6.25 mg total) by mouth 2 (two) times daily with a meal. 12/07/21   Abonza, [place], PA-C  CLENPIQ 10-3.5-12 MG-GM -GM/175ML SOLN SMARTSIG:175 Milliliter(s) By Mouth Twice a Week 12/09/21   [provider]  clindamycin (CLINDAGEL) 1 % gel     [provider]  Dermatological Products, [place]. [facility] ROSACEA EX) Apply topically Once daily for 30 09/03/21 03/01/22  [provider]  diclofenac Sodium (VOLTAREN) 1 % GEL Apply 2 g 4 times a day by topical route.    [provider]  fluocinonide (LIDEX) 0.05 % external solution     [provider]  fluticasone (FLONASE) 50 MCG/ACT nasal spray Place 2 sprays into both nostrils daily as needed for allergies.    [provider]  fluticasone (FLOVENT HFA) 110 MCG/ACT inhaler 2 puff(s) Puff and swallow/ not  for asthma, for EOE 2 times a day for 30 day(s) 11/03/21   [provider]  folic acid (FOLVITE) 1 MG tablet TAKE 1 TABLET BY MOUTH DAILY. 01/05/22   Lorrene Reid, PA-C  gabapentin (NEURONTIN) 300 MG capsule Take 1 capsule twice a day by oral route.    [provider]  hydrocortisone 2.5 % cream hydrocortisone 2.5 % topical cream  APPLY TO AFFECTED AREA TWICE A DAY FOR 14 DAYS    [provider]  ketoconazole (NIZORAL) 2 % shampoo APPLY 5 MLS TO AFFECTED AREA 2 TO 3 TIMES A WEEK  for 30    [provider]  meloxicam (MOBIC) 15 MG tablet Take 1 tablet (15 mg total) by mouth daily as needed for pain. 08/03/21   Lorrene Reid, PA-C  Multiple Vitamins-Minerals (ADULT GUMMY PO) Take 2 capsules by mouth daily.    [provider]  norethindrone-ethinyl estradiol (FEMHRT 1/5) 1-5 MG-MCG TABS tablet Take 1 tablet by mouth daily.    [provider]  ondansetron (ZOFRAN-ODT) 8 MG disintegrating tablet     [provider]  pantoprazole (PROTONIX) 40 MG tablet TAKE 1 TABLET BY MOUTH 2 TIMES DAILY. 01/11/21   Lorrene Reid, PA-C  SUMAtriptan (IMITREX) 100 MG tablet Take 1 tablet (100 mg total) by mouth every 2 (two) hours as needed for migraine. May repeat in 2 hours if headache persists or recurs. 12/07/21   Lorrene Reid, PA-C  thiamine (VITAMIN B-1) 100 MG tablet Take 1 tablet every day by oral route.    [provider]  traZODone (DESYREL) 50 MG tablet Take 50 mg by mouth at bedtime.    [provider]  triamcinolone cream (KENALOG) 0.1 % APPLY TO AFFECTED AREA TWICE A DAY    [provider]      Allergies    Doxycycline, Codeine, and Tetracycline hcl    Review of Systems   Review of Systems  Physical Exam Updated Vital Signs BP 132/87   Pulse (!) 108   Temp 97.8 F (36.6 C)   Resp 19   SpO2 99%  Physical Exam Vitals and nursing Ayala reviewed.  Constitutional:      Appearance: She is well-developed.     Comments: Anxious appearing  HENT:     Head: Atraumatic.  Eyes:     Extraocular Movements: Extraocular movements intact.     Pupils: Pupils are equal, round, and reactive to light.     Comments: No nystagmus / ocular clonus  Cardiovascular:     Rate and Rhythm: Tachycardia present.  Pulmonary:     Effort: Pulmonary effort is normal.  Musculoskeletal:     Cervical back: Normal range of motion and neck supple.  Skin:    General: Skin is warm and dry.  Neurological:     Mental Status: She is  disoriented.     Comments: Not rigid, no clonus, normal reflexes in the patella  Psychiatric:     Comments: Abnormal behavior, judgment, appears anxious     ED Results / Procedures / Treatments   Labs (all labs ordered are listed, but only abnormal results are displayed) Labs Reviewed  COMPREHENSIVE METABOLIC PANEL - Abnormal; Notable for the following components:      Result Value   Potassium 3.4 (*)    CO2 16 (*)    AST 159 (*)    ALT 168 (*)    All other components within normal limits  RAPID URINE DRUG SCREEN, HOSP PERFORMED - Abnormal; Notable for  the following components:   Benzodiazepines POSITIVE (*)    Tetrahydrocannabinol POSITIVE (*)    All other components within normal limits  URINALYSIS, ROUTINE W REFLEX MICROSCOPIC - Abnormal; Notable for the following components:   Color, Urine STRAW (*)    Ketones, ur 20 (*)    Leukocytes,Ua TRACE (*)    All other components within normal limits  CBC WITH DIFFERENTIAL/PLATELET  ETHANOL    EKG EKG Interpretation  Date/Time:  Tuesday February 15 2022 19:59:04 EST Ventricular Rate:  116 PR Interval:  280 QRS Duration: 78 QT Interval:  352 QTC Calculation: 489 R Axis:   64 Text Interpretation: Sinus tachycardia with 1st degree A-V block Cannot rule out Anterior infarct , age undetermined ST & T wave abnormality, consider lateral ischemia Abnormal ECG When compared with ECG of 25-Jan-2022 23:06, PREVIOUS ECG IS PRESENT Confirmed by Varney Biles [phone]) on 02/15/2022 9:41:33 PM  Radiology No results found.  Procedures .Critical Care  Performed by: Varney Biles, MD Authorized by: Varney Biles, MD   Critical care provider statement:    Critical care time (minutes):  82   Critical care was necessary to treat or prevent imminent or life-threatening deterioration of the following conditions:  CNS failure or compromise   Critical care was time spent personally by me on the following activities:  Development of  treatment plan with patient or surrogate, discussions with consultants, evaluation of patient's response to treatment, examination of patient, ordering and review of laboratory studies, ordering and review of radiographic studies, ordering and performing treatments and interventions, pulse oximetry, re-evaluation of patient's condition, review of old charts and obtaining history from patient or surrogate     Medications Ordered in ED Medications  PHENobarbital (LUMINAL) tablet 64.8 mg (has no administration in time range)  midazolam (VERSED) injection 2 mg (2 mg Intravenous Given 02/15/22 1657)  LORazepam (ATIVAN) tablet 2 mg (2 mg Oral Given 02/15/22 1816)  midazolam (VERSED) injection 2 mg (2 mg Intravenous Given 02/15/22 2013)  LORazepam (ATIVAN) injection 1 mg (1 mg Intravenous Given 02/15/22 2131)  haloperidol lactate (HALDOL) injection 5 mg (5 mg Intravenous Given 02/15/22 2131)    ED Course/ Medical Decision Making/ A&P Clinical Course as of 02/15/22 2246  Tue Feb 15, 2022  2152 Patient reassessed multiple times.  Her UDS is positive for THC.  Bicarb is slightly low at 16, ethanol level was not collected.  Previous ethanol level were negative.  CBC is normal.  UA is normal.  Patient continues to remain agitated.  She has received multiple rounds of benzodiazepine.  I have given now Haldol.  She has been observed closely for the last 6 hours, still not back to baseline.  Still not improving.  We will need to admit her to the hospital. [AN]  2153 Medicine team is requesting that we speak with critical care about consultation for Precedex. [AN]  2245 Critical care recommends that we give patient oral phenobarbital.  They will see the patient and if needed, admitted to ICU.  Medicine team made aware of this recommendation. [AN]    Clinical Course User Index [AN] Varney Biles, MD                           Medical Decision Making 66 year old female comes in with chief complaint of  agitation and altered mental status.  She has history of anxiety disorder.  She is on multiple psychiatric medication.  She also has history of alcohol  abuse.  I have looked at patient's discharge summaries from this year.  Janeal Holmes also called patient's family members and caregivers to get meaningful history.  Patient is tachycardic, anxious appearing, tachypneic. I do not appreciate any clonus, hyperreflexia, rigidity.  Differential diagnosis considered includes serotonin syndrome, agitated delirium, side effects from delta Gummies, psychosis NOS, alcohol withdrawal.  We will try to give patient some benzo to see if this is simply ill effects from delta gummy use.  If patient does not respond, then she will need admission to the hospital.  Previous admissions have required phenobarb drip.    Amount and/or Complexity of Data Reviewed Labs: ordered.  Risk Prescription drug management. Decision regarding hospitalization.    Final Clinical Impression(s) / ED Diagnoses Final diagnoses:  Altered mental status, unspecified altered mental status type  Delirium    Rx / DC Orders ED Discharge Orders     None         Varney Biles, MD 02/15/22 2153    Varney Biles, MD 02/15/22 2246

## 2022-02-15 NOTE — ED Notes (Signed)
Unable to complete VS due to severe agitation. RN aware

## 2022-02-15 NOTE — ED Notes (Signed)
Secure message sent to receiving RN [facility], awaiting response. Pt has an inpatient bed at this time

## 2022-02-15 NOTE — ED Notes (Signed)
Pt has a ready bed waiting on inpatient nurse to be assigned to pt for care handoff Dr. Nevada Crane at the bedside to assess pt

## 2022-02-15 NOTE — ED Notes (Signed)
Pt now admits to taking a THC gummy a few hours ago

## 2022-02-15 NOTE — ED Notes (Signed)
Receiving RN Quentin Angst has agreed to accept [facility] once pt has arrived to inpatient unit, all questions and concerns address.

## 2022-02-15 NOTE — H&P (Incomplete)
History and Physical  Terri Ayala SKA:[id number] DOB: 02/05/1956 DOA: 02/15/2022  Referring physician: Dr. Kathrynn Humble, [place] PCP: Lorrene Reid, PA-C  Outpatient Specialists: Behavioral health. Patient coming from: Home.  Chief Complaint: Panic attack and agitation  HPI: Terri Ayala is a 66 y.o. female with medical history significant for generalized anxiety disorder, chronic depression, former alcohol user, essential hypertension, chronic back pain, who presented from home due to sudden onset panic attack at home.  The patient called 911 herself.  2 weeks ago she had caretakers who assisted with her medications.  Unclear how long she is gone without taking her psych medications.  Recently admitted in November for agitation and panic attacks.  Upon EMS assessment, she appears to be hallucinating.  She admits to using delta 8 Gummies today.  UDS positive for THC and benzodiazepine.  She received multiple rounds of benzos without improvement of her agitation.  Also received 1 dose of phenobarbital.  No improvement.  EDP discussed the case with critical care medicine who will see in consultation.  The patient was admitted by [facility], hospitalist service to stepdown unit.  At the time of this visit patient is restless and agitated.  PCCM consulted, she might benefit from Precedex drip for agitation delirium.  ED Course: Tmax 97.9.  BP 163/101, pulse 118, respiratory 23, saturation 98% on room air.  Lab studies remarkable for WBC 13.8, creatinine 0.78 GFR greater than 60.  Review of Systems: Review of systems as noted in the HPI. All other systems reviewed and are negative.   Past Medical History:  Diagnosis Date   Alcohol withdrawal ([place]) 11/2017; 01/04/2018   Alcoholism ([place])    Anxiety    Arthritis    "hands" (01/04/2018)   ASYMPTOMATIC POSTMENOPAUSAL STATUS 08/17/2009   Chronic lower back pain    Depression    DYSPHAGIA 06/19/2007   Fatty liver, alcoholic    GERD (gastroesophageal reflux  disease)    GOITER, MULTINODULAR 08/17/2009   Hyperlipidemia    Hypertension    Migraines    "not sure what triggers them; I'll have 1 q couple weeks or more; come 2 days in a row when they come" (01/04/2018)   Mitral valve prolapse    OSTEOPOROSIS 08/17/2009   PONV (postoperative nausea and vomiting)    Supraventricular tachycardia    Past Surgical History:  Procedure Laterality Date   BREAST BIOPSY Right    "benign"   CATARACT EXTRACTION W/ INTRAOCULAR LENS  IMPLANT, BILATERAL     CERVICAL CONE BIOPSY  2000s   CERVIX LESION DESTRUCTION  1991   "dysplasia; lesions"   CHOLECYSTECTOMY N/A 03/14/2013   Procedure: LAPAROSCOPIC CHOLECYSTECTOMY WITH ATTEMPTED INTRAOPERATIVE CHOLANGIOGRAM;  Surgeon: Harl Bowie, MD;  Location: [place];  Service: General;  Laterality: N/A;   HARDWARE REMOVAL Left 07/22/2021   Procedure: HARDWARE REMOVAL;  Surgeon: Altamese Menard, MD;  Location: [place];  Service: Orthopedics;  Laterality: Left;   laporoscopic abdominal surgery     "for endometriosis"   LEFT HEART CATH AND CORONARY ANGIOGRAPHY N/A 05/07/2018   Procedure: LEFT HEART CATH AND CORONARY ANGIOGRAPHY;  Surgeon: Belva Crome, MD;  Location: [place] [facility];  Service: Cardiovascular;  Laterality: N/A;   ORIF ANKLE FRACTURE Left 02/11/2021   Procedure: OPEN REDUCTION INTERNAL FIXATION (ORIF) ANKLE FRACTURE;  Surgeon: [place], MD;  Location: [place];  Service: Orthopedics;  Laterality: Left;   TONSILLECTOMY      Social History:  reports that she quit smoking about 47 years ago.  Her smoking use included cigarettes. She has a 7.00 pack-year smoking history. She has never used smokeless tobacco. She reports that she does not currently use alcohol after a past usage of about 28.0 standard drinks of alcohol per week. She reports that she does not use drugs.   Allergies  Allergen Reactions   Doxycycline Swelling    Mouth swelling and sores   Codeine Itching   Tetracycline Hcl Swelling     Mouth swelling and sores    Family History  Problem Relation Age of Onset   Colon cancer Father        Deceased, 44   Osteoporosis Mother        Living, 28   Healthy Brother    Healthy Brother    Healthy Son    Healthy Son    Thyroid disease Neg Hx    Goiter Neg Hx       Prior to Admission medications   Medication Sig Start Date End Date Taking? Authorizing Provider  acetaminophen (TYLENOL) 500 MG tablet Take 500 mg by mouth every 8 (eight) hours as needed. Patient not taking: Reported on 12/07/2021    [provider]  albuterol (VENTOLIN HFA) 108 (90 Base) MCG/ACT inhaler INHALE 2 PUFFS INTO THE LUNGS EVERY 6 HOURS AS NEEDED FOR WHEEZING OR SHORTNESS OF BREATH. 07/02/21   Lorrene Reid, PA-C  alendronate (FOSAMAX) 70 MG tablet Take 70 mg by mouth every Monday.    [provider]  ampicillin (PRINCIPEN) 500 MG capsule Take 500 mg by mouth daily. 09/03/21   [provider]  Azelaic Acid 15 % gel     [provider]  buPROPion ER (WELLBUTRIN SR) 100 MG 12 hr tablet Take 1 tablet (100 mg total) by mouth 2 (two) times daily. 12/17/21 03/17/22  Dara Hoyer, PA-C  busPIRone (BUSPAR) 15 MG tablet Take 1 tablet (15 mg total) by mouth 3 (three) times daily. 12/17/21   Dara Hoyer, PA-C  Calcium Carbonate-Vitamin D (CALCIUM 600+D PO) Take 1 tablet by mouth daily.    [provider]  carvedilol (COREG) 6.25 MG tablet Take 1 tablet (6.25 mg total) by mouth 2 (two) times daily with a meal. 12/07/21   Abonza, [place], PA-C  CLENPIQ 10-3.5-12 MG-GM -GM/175ML SOLN SMARTSIG:175 Milliliter(s) By Mouth Twice a Week 12/09/21   [provider]  clindamycin (CLINDAGEL) 1 % gel     [provider]  Dermatological Products, [place]. [facility] ROSACEA EX) Apply topically Once daily for 30 09/03/21 03/01/22  [provider]  diclofenac Sodium (VOLTAREN) 1 % GEL Apply 2 g 4 times a day by topical route.    [provider]   fluocinonide (LIDEX) 0.05 % external solution     [provider]  fluticasone (FLONASE) 50 MCG/ACT nasal spray Place 2 sprays into both nostrils daily as needed for allergies.    [provider]  fluticasone (FLOVENT HFA) 110 MCG/ACT inhaler 2 puff(s) Puff and swallow/ not for asthma, for EOE 2 times a day for 30 day(s) 11/03/21   [provider]  folic acid (FOLVITE) 1 MG tablet TAKE 1 TABLET BY MOUTH DAILY. 01/05/22   Lorrene Reid, PA-C  gabapentin (NEURONTIN) 300 MG capsule Take 1 capsule twice a day by oral route.    [provider]  hydrocortisone 2.5 % cream hydrocortisone 2.5 % topical cream  APPLY TO AFFECTED AREA TWICE A DAY FOR 14 DAYS    [provider]  ketoconazole (NIZORAL) 2 % shampoo  APPLY 5 MLS TO AFFECTED AREA 2 TO 3 TIMES A WEEK for 30    [provider]  meloxicam (MOBIC) 15 MG tablet Take 1 tablet (15 mg total) by mouth daily as needed for pain. 08/03/21   Lorrene Reid, PA-C  Multiple Vitamins-Minerals (ADULT GUMMY PO) Take 2 capsules by mouth daily.    [provider]  norethindrone-ethinyl estradiol (FEMHRT 1/5) 1-5 MG-MCG TABS tablet Take 1 tablet by mouth daily.    [provider]  ondansetron (ZOFRAN-ODT) 8 MG disintegrating tablet     [provider]  pantoprazole (PROTONIX) 40 MG tablet TAKE 1 TABLET BY MOUTH 2 TIMES DAILY. 01/11/21   Lorrene Reid, PA-C  SUMAtriptan (IMITREX) 100 MG tablet Take 1 tablet (100 mg total) by mouth every 2 (two) hours as needed for migraine. May repeat in 2 hours if headache persists or recurs. 12/07/21   Lorrene Reid, PA-C  thiamine (VITAMIN B-1) 100 MG tablet Take 1 tablet every day by oral route.    [provider]  traZODone (DESYREL) 50 MG tablet Take 50 mg by mouth at bedtime.    [provider]  triamcinolone cream (KENALOG) 0.1 % APPLY TO AFFECTED AREA TWICE A DAY    [provider]    Physical Exam: BP 132/87    Pulse (!) 108   Temp 97.8 F (36.6 C)   Resp 19   SpO2 99%   General: 66 y.o. year-old female well developed well nourished in no acute distress.  Alert and confused. Cardiovascular: Regular rate and rhythm with no rubs or gallops.  No thyromegaly or JVD noted.  No lower extremity edema. 2/4 pulses in all 4 extremities. Respiratory: Clear to auscultation with no wheezes or rales.  Poor inspiratory effort. Abdomen: Soft nontender nondistended with normal bowel sounds x4 quadrants. Muskuloskeletal: No cyanosis, clubbing or edema noted bilaterally Neuro: CN II-XII intact, strength, sensation, reflexes Skin: No ulcerative lesions noted or rashes Psychiatry: Judgement and insight appear altered. Mood is appropriate for condition and setting          Labs on Admission:  Basic Metabolic Panel: Recent Labs  Lab 02/15/22 1630  NA 139  K 3.4*  CL 108  CO2 16*  GLUCOSE 97  BUN 13  CREATININE 0.89  CALCIUM 10.1   Liver Function Tests: Recent Labs  Lab 02/15/22 1630  AST 159*  ALT 168*  ALKPHOS 55  BILITOT 0.9  PROT 7.7  ALBUMIN 4.3   No results for input(s): "LIPASE", "AMYLASE" in the last 168 hours. No results for input(s): "AMMONIA" in the last 168 hours. CBC: Recent Labs  Lab 02/15/22 1630  WBC 9.2  NEUTROABS 7.0  HGB 13.7  HCT 41.6  MCV 95.4  PLT 191   Cardiac Enzymes: No results for input(s): "CKTOTAL", "CKMB", "CKMBINDEX", "TROPONINI" in the last 168 hours.  BNP (last 3 results) No results for input(s): "BNP" in the last 8760 hours.  ProBNP (last 3 results) No results for input(s): "PROBNP" in the last 8760 hours.  CBG: No results for input(s): "GLUCAP" in the last 168 hours.  Radiological Exams on Admission: No results found.  EKG: I independently viewed the EKG done and my findings are as followed: Sinus tachycardia rate of 116, first-degree AV block with nonspecific ST-T changes.  QTc 489.  Assessment/Plan Present on Admission:  Acute metabolic  encephalopathy  Principal Problem:   Acute metabolic encephalopathy  Acute metabolic encephalopathy/agitation delirium Suspect noncompliant to her psych medication Received multiple rounds of IV  benzodiazepine without improvement Also received a loading dose of p.o. phenobarbital 60 mg without improvement May benefit from Precedex drip CCM consulted by EDP.  Appreciate assistance.  Anion gap metabolic acidosis Serum bicarb 16, anion gap 15 Start gentle IV fluid hydration LR at 75 cc/h Repeat renal function test in the morning  Isolated elevated liver chemistries AST and ALT elevated 159 and 168 respectively. Avoid hepatotoxic agents Gentle IV fluid hydration Repeat liver chemistry test in the morning  Hypokalemia Serum potassium 3.4 Repleted orally with KCl 40 mEq x 1 dose.  Chronic anxiety Unclear how long she was not taking her home psych medications Reconciled medications  Polysubstance abuse including THC Reportedly she quit alcohol use weeks ago. UDS positive for THC and benzodiazepine.    Critical care time: 65 minutes.     DVT prophylaxis: Subcu Lovenox daily  Code Status: Full code  Family Communication: None at bedside  Disposition Plan: Admitted to stepdown unit  Consults called: CCM  Admission status: Inpatient status.   Status is: Inpatient The patient requires at least 2 midnights for further evaluation and treatment of present condition.   Kayleen Memos MD [facility] Pager [phone]  If 7PM-7AM, please contact night-coverage [email or web address] Password [facility]  02/15/2022, 9:52 PM

## 2022-02-15 NOTE — ED Notes (Signed)
Pt shouting "NO, mommy, daddy", pt still referring to her dog [name] to "come and cross over [place]". Pt kicking her legs, pt remains in bilateral wrist restraints, medication administer, refer to [facility].

## 2022-02-15 NOTE — ED Notes (Signed)
Pt also confused, shouting out for her dog [name], thinks she is at home, pt calls out random names, pt shouts "hurry up come get me, hurry up hurry up, Bella, hermit, can you get me, we need to get things done, [facility] has it". Pt not making sense as her sentences are running together. Dr. Kathrynn Humble is aware.

## 2022-02-15 NOTE — ED Notes (Signed)
Placed wrist non-violent restraints w/ RN

## 2022-02-15 NOTE — ED Notes (Signed)
Pt is agitated and anxious, continuously climbing out the bed, trying to climb over bed rails, pt is unsteady on her feet as well, pt is fall risk. Have asked pt to please lay down and rest pt reports "No, I do not think so, I will not do so". Message sent to Dr. Kathrynn Humble regarding her behavior.

## 2022-02-15 NOTE — ED Notes (Signed)
Pt changed into burgundy scrubs. Pt has 2 personal belonging's bag that includes: glasses, shoulder bag, shirts, pants, and shoes. Bags are labeled and placed in cabinets behind nursing station 9-25. Security wanded pt

## 2022-02-15 NOTE — ED Triage Notes (Signed)
Pt BIB EMS for a panic attack that started about 2 hours ago. Pt has a history of anxiety, needs meds refilled, but can't until she sees her doctor tomorrow

## 2022-02-15 NOTE — ED Notes (Signed)
Pt is still very anxious, restless, pacing around in the hall. Pt has been redirected several times to sit back on the stretcher

## 2022-02-15 NOTE — ED Notes (Signed)
Pt remains restless, agitated, continues to shout, pt sticking her legs through bed rail, multiple attempts to keep pt in bed for safety so there is no injury to self, however, pt is not able to be redirected at this time, pt shakes bail rails at times even though she is in bilateral soft wrist restraints trying to remove the rails as she continues to try and get out of bed. Medications have been administer, refer to [facility]

## 2022-02-16 DIAGNOSIS — G928 Other toxic encephalopathy: Secondary | ICD-10-CM

## 2022-02-16 DIAGNOSIS — G9341 Metabolic encephalopathy: Secondary | ICD-10-CM | POA: Diagnosis not present

## 2022-02-16 LAB — BASIC METABOLIC PANEL
Anion gap: 6 (ref 5–15)
BUN: 9 mg/dL (ref 8–23)
CO2: 21 mmol/L — ABNORMAL LOW (ref 22–32)
Calcium: 8.6 mg/dL — ABNORMAL LOW (ref 8.9–10.3)
Chloride: 109 mmol/L (ref 98–111)
Creatinine, Ser: 0.73 mg/dL (ref 0.44–1.00)
GFR, Estimated: >60 mL/min (ref >60)
Glucose, Bld: 92 mg/dL (ref 70–99)
Potassium: 2.9 mmol/L — ABNORMAL LOW (ref 3.5–5.1)
Sodium: 136 mmol/L (ref 135–145)

## 2022-02-16 LAB — CREATININE, SERUM
Creatinine, Ser: 0.78 mg/dL (ref 0.44–1.00)
GFR, Estimated: >60 mL/min (ref >60)

## 2022-02-16 LAB — COMPREHENSIVE METABOLIC PANEL
ALT: 125 U/L — ABNORMAL HIGH (ref 0–44)
AST: 100 U/L — ABNORMAL HIGH (ref 15–41)
Albumin: 3.6 g/dL (ref 3.5–5.0)
Alkaline Phosphatase: 42 U/L (ref 38–126)
Anion gap: 11 (ref 5–15)
BUN: 12 mg/dL (ref 8–23)
CO2: 22 mmol/L (ref 22–32)
Calcium: 8.8 mg/dL — ABNORMAL LOW (ref 8.9–10.3)
Chloride: 108 mmol/L (ref 98–111)
Creatinine, Ser: 0.74 mg/dL (ref 0.44–1.00)
GFR, Estimated: >60 mL/min (ref >60)
Glucose, Bld: 110 mg/dL — ABNORMAL HIGH (ref 70–99)
Potassium: 2.8 mmol/L — ABNORMAL LOW (ref 3.5–5.1)
Sodium: 141 mmol/L (ref 135–145)
Total Bilirubin: 1.3 mg/dL — ABNORMAL HIGH (ref 0.3–1.2)
Total Protein: 6.6 g/dL (ref 6.5–8.1)

## 2022-02-16 LAB — CBC
HCT: 35.3 % — ABNORMAL LOW (ref 36.0–46.0)
HCT: 35.6 % — ABNORMAL LOW (ref 36.0–46.0)
Hemoglobin: 12.2 g/dL (ref 12.0–15.0)
Hemoglobin: 12.2 g/dL (ref 12.0–15.0)
MCH: 31.8 pg (ref 26.0–34.0)
MCH: 31.9 pg (ref 26.0–34.0)
MCHC: 34.6 g/dL (ref 30.0–36.0)
MCHC: 34.3 g/dL (ref 30.0–36.0)
MCV: 91.9 fL (ref 80.0–100.0)
MCV: 93 fL (ref 80.0–100.0)
Platelets: 220 10*3/uL (ref 150–400)
Platelets: 217 10*3/uL (ref 150–400)
RBC: 3.84 MIL/uL — ABNORMAL LOW (ref 3.87–5.11)
RBC: 3.83 MIL/uL — ABNORMAL LOW (ref 3.87–5.11)
RDW: 13.6 % (ref 11.5–15.5)
RDW: 13.7 % (ref 11.5–15.5)
WBC: 13.8 10*3/uL — ABNORMAL HIGH (ref 4.0–10.5)
WBC: 12.9 10*3/uL — ABNORMAL HIGH (ref 4.0–10.5)
nRBC: 0 % (ref 0.0–0.2)
nRBC: 0 % (ref 0.0–0.2)

## 2022-02-16 LAB — MAGNESIUM
Magnesium: 1.7 mg/dL (ref 1.7–2.4)
Magnesium: 1.9 mg/dL (ref 1.7–2.4)

## 2022-02-16 LAB — VITAMIN B12: Vitamin B-12: 2258 pg/mL — ABNORMAL HIGH (ref 180–914)

## 2022-02-16 LAB — PHOSPHORUS: Phosphorus: 3.3 mg/dL (ref 2.5–4.6)

## 2022-02-16 LAB — MRSA NEXT GEN BY PCR, NASAL: MRSA by PCR Next Gen: NOT DETECTED

## 2022-02-16 LAB — ETHANOL: Alcohol, Ethyl (B): <10 mg/dL (ref <10)

## 2022-02-16 LAB — TSH: TSH: 3.018 u[IU]/mL (ref 0.350–4.500)

## 2022-02-16 LAB — AMMONIA: Ammonia: 20 umol/L (ref 9–35)

## 2022-02-16 LAB — HIV ANTIBODY (ROUTINE TESTING W REFLEX): HIV Screen 4th Generation wRfx: NONREACTIVE

## 2022-02-16 MED ORDER — PHENOBARBITAL SODIUM 65 MG/ML IJ SOLN
65.0000 mg | Freq: Three times a day (TID) | INTRAMUSCULAR | Status: AC
  Administered 2022-02-16 – 2022-02-18 (×6): 65 mg via INTRAVENOUS
  Filled 2022-02-16 (×6): qty 1

## 2022-02-16 MED ORDER — ADULT MULTIVITAMIN W/MINERALS CH
1.0000 | ORAL_TABLET | Freq: Every day | ORAL | Status: DC
  Administered 2022-02-16 – 2022-02-25 (×9): 1 via ORAL
  Filled 2022-02-16 (×10): qty 1

## 2022-02-16 MED ORDER — POTASSIUM CHLORIDE 10 MEQ/100ML IV SOLN
10.0000 meq | Freq: Once | INTRAVENOUS | Status: AC
  Administered 2022-02-16: 10 meq via INTRAVENOUS
  Filled 2022-02-16: qty 100

## 2022-02-16 MED ORDER — ENOXAPARIN SODIUM 40 MG/0.4ML IJ SOSY
40.0000 mg | PREFILLED_SYRINGE | INTRAMUSCULAR | Status: DC
  Administered 2022-02-16 – 2022-02-24 (×7): 40 mg via SUBCUTANEOUS
  Filled 2022-02-16 (×10): qty 0.4

## 2022-02-16 MED ORDER — LORAZEPAM 2 MG/ML IJ SOLN
1.0000 mg | INTRAMUSCULAR | Status: DC | PRN
  Administered 2022-02-16 – 2022-02-17 (×5): 1 mg via INTRAVENOUS
  Filled 2022-02-16 (×6): qty 1

## 2022-02-16 MED ORDER — MAGNESIUM SULFATE 2 GM/50ML IV SOLN
2.0000 g | Freq: Once | INTRAVENOUS | Status: AC
  Administered 2022-02-16: 2 g via INTRAVENOUS
  Filled 2022-02-16: qty 50

## 2022-02-16 MED ORDER — FOLIC ACID 1 MG PO TABS
1.0000 mg | ORAL_TABLET | Freq: Every day | ORAL | Status: DC
  Administered 2022-02-16 – 2022-02-25 (×9): 1 mg via ORAL
  Filled 2022-02-16 (×10): qty 1

## 2022-02-16 MED ORDER — POTASSIUM CHLORIDE 10 MEQ/100ML IV SOLN
10.0000 meq | INTRAVENOUS | Status: AC
  Administered 2022-02-16 – 2022-02-17 (×6): 10 meq via INTRAVENOUS
  Filled 2022-02-16 (×6): qty 100

## 2022-02-16 MED ORDER — LORAZEPAM 1 MG PO TABS: 1.0000 mg | ORAL_TABLET | ORAL | Status: DC | PRN

## 2022-02-16 MED ORDER — FLUTICASONE PROPIONATE 50 MCG/ACT NA SUSP: 2.0000 | Freq: Every day | NASAL | Status: DC | PRN

## 2022-02-16 MED ORDER — POTASSIUM CHLORIDE 20 MEQ PO PACK
40.0000 meq | PACK | ORAL | Status: AC
  Administered 2022-02-16 (×2): 40 meq via ORAL
  Filled 2022-02-16 (×2): qty 2

## 2022-02-16 MED ORDER — THIAMINE HCL 100 MG/ML IJ SOLN
100.0000 mg | Freq: Every day | INTRAMUSCULAR | Status: DC
  Administered 2022-02-16 – 2022-02-17 (×2): 100 mg via INTRAVENOUS
  Filled 2022-02-16 (×2): qty 2

## 2022-02-16 MED ORDER — ALBUTEROL SULFATE (2.5 MG/3ML) 0.083% IN NEBU: 3.0000 mL | INHALATION_SOLUTION | Freq: Four times a day (QID) | RESPIRATORY_TRACT | Status: DC | PRN

## 2022-02-16 MED ORDER — BUPROPION HCL ER (SR) 100 MG PO TB12
100.0000 mg | ORAL_TABLET | Freq: Two times a day (BID) | ORAL | Status: DC
  Administered 2022-02-16 – 2022-02-25 (×16): 100 mg via ORAL
  Filled 2022-02-16 (×19): qty 1

## 2022-02-16 MED ORDER — PROCHLORPERAZINE EDISYLATE 10 MG/2ML IJ SOLN
5.0000 mg | Freq: Four times a day (QID) | INTRAMUSCULAR | Status: DC | PRN
  Administered 2022-02-16: 5 mg via INTRAVENOUS
  Filled 2022-02-16: qty 2

## 2022-02-16 MED ORDER — LORAZEPAM 2 MG/ML IJ SOLN
1.0000 mg | INTRAMUSCULAR | Status: DC | PRN
  Administered 2022-02-16: 3 mg via INTRAVENOUS
  Administered 2022-02-16: 1 mg via INTRAVENOUS
  Filled 2022-02-16: qty 2
  Filled 2022-02-16: qty 1

## 2022-02-16 MED ORDER — POTASSIUM CHLORIDE CRYS ER 20 MEQ PO TBCR: 40.0000 meq | EXTENDED_RELEASE_TABLET | Freq: Once | ORAL | Status: DC

## 2022-02-16 MED ORDER — LACTATED RINGERS IV SOLN: INTRAVENOUS | Status: AC

## 2022-02-16 MED ORDER — THIAMINE MONONITRATE 100 MG PO TABS
100.0000 mg | ORAL_TABLET | Freq: Every day | ORAL | Status: DC
  Administered 2022-02-18 – 2022-02-20 (×3): 100 mg via ORAL
  Filled 2022-02-16 (×5): qty 1

## 2022-02-16 MED ORDER — POTASSIUM CHLORIDE 10 MEQ/100ML IV SOLN
10.0000 meq | INTRAVENOUS | Status: AC
  Administered 2022-02-16 (×2): 10 meq via INTRAVENOUS
  Filled 2022-02-16 (×2): qty 100

## 2022-02-16 MED ORDER — BUDESONIDE 0.25 MG/2ML IN SUSP: 0.2500 mg | Freq: Two times a day (BID) | RESPIRATORY_TRACT | Status: DC

## 2022-02-16 MED ORDER — BUSPIRONE HCL 5 MG PO TABS
15.0000 mg | ORAL_TABLET | Freq: Three times a day (TID) | ORAL | Status: DC
  Administered 2022-02-16 – 2022-02-25 (×24): 15 mg via ORAL
  Filled 2022-02-16 (×2): qty 3
  Filled 2022-02-16: qty 1
  Filled 2022-02-16 (×5): qty 3
  Filled 2022-02-16: qty 1
  Filled 2022-02-16 (×5): qty 3
  Filled 2022-02-16: qty 1
  Filled 2022-02-16: qty 3
  Filled 2022-02-16: qty 1
  Filled 2022-02-16 (×5): qty 3
  Filled 2022-02-16 (×2): qty 1
  Filled 2022-02-16 (×2): qty 3

## 2022-02-16 MED ORDER — PHENOBARBITAL SODIUM 65 MG/ML IJ SOLN: 32.5000 mg | Freq: Three times a day (TID) | INTRAMUSCULAR | Status: DC

## 2022-02-16 MED ORDER — POLYETHYLENE GLYCOL 3350 17 G PO PACK: 17.0000 g | PACK | Freq: Every day | ORAL | Status: DC | PRN

## 2022-02-16 MED ORDER — CARVEDILOL 6.25 MG PO TABS
6.2500 mg | ORAL_TABLET | Freq: Two times a day (BID) | ORAL | Status: DC
  Administered 2022-02-18 – 2022-02-25 (×14): 6.25 mg via ORAL
  Filled 2022-02-16 (×15): qty 1

## 2022-02-16 MED ORDER — POTASSIUM CHLORIDE 10 MEQ/100ML IV SOLN
10.0000 meq | INTRAVENOUS | Status: DC
  Administered 2022-02-16: 10 meq via INTRAVENOUS
  Filled 2022-02-16: qty 100

## 2022-02-16 NOTE — Consult Note (Signed)
Brief Psychiatry Consult Note  Terri Ayala and saw pt, reviewed chart. Had pretty recent period of sobriety, unclear if degree of encephalopathy fully explained by EtOH w/d. Remains encephalopathic, A&O x0 on my eval. Not able to participate in interview. D/w primary - might consider baclofen at ~50% home dose if no improvement before tomorrow, although this was held at recent dc 12/2 and unclear if resumed.    I personally spent 20 minutes on the unit in direct patient care. The direct patient care time included face-to-face time with the patient, reviewing the patient's chart, communicating with other professionals, and coordinating care. Greater than 50% of this time was spent in counseling or coordinating care with the patient regarding goals of hospitalization, psycho-education, and discharge planning needs.   Terri Ayala A Francisco Eyerly

## 2022-02-16 NOTE — TOC Initial Note (Signed)
Transition of Care [facility]) - Initial/Assessment Note    Patient Details  Name: Terri Ayala MRN: [id number] Date of Birth: 1956-02-05  Transition of Care [facility] CM/SW Contact:    Roseanne Kaufman, RN Phone Number: 02/16/2022, 6:44 PM  Clinical Narrative:      Received TOC consult for SA resources, attached to AVS.   TOC will continue to follow for TOC/discharge needs.               Barriers to Discharge: Continued Medical Work up   Patient Goals and CMS Choice            Expected Discharge Plan and Services   In-house Referral: NA Discharge Planning Services: CM Consult   Living arrangements for the past 2 months: [place]                                      Prior Living Arrangements/Services Living arrangements for the past 2 months: Single Family Home Lives with:: Self                   Activities of Daily Living      Permission Sought/Granted                  Emotional Assessment           Psych Involvement: Yes (comment)  Admission diagnosis:  Delirium [R41.0] Altered mental status, unspecified altered mental status type [N36.14] Acute metabolic encephalopathy [E31.54] Patient Active Problem List   Diagnosis Date Noted   Acute metabolic encephalopathy 00/86/7619   Depression 01/26/2022   Alcohol abuse 01/26/2022   Alcohol withdrawal delirium, acute, mixed level of activity ([place]) 06/08/2021   Rosacea 05/17/2021   Dermatitis 05/17/2021   Seborrheic keratosis 05/17/2021   Melanocytic nevi of trunk 05/17/2021   Chronic pain of left ankle 05/12/2021   Body mass index 26.0-26.9, adult 05/12/2021   Ankle fracture, left, closed, initial encounter 02/10/2021   L1 vertebral fracture ([place]) 02/10/2021   Thyroid nodule 02/10/2021   Prolonged QT interval 02/10/2021   Elevated cholesterol 10/19/2020   Pedal edema 10/19/2020   History of colonic polyps 03/25/2020   Hemorrhage of rectum and anus 03/25/2020   Gastroparesis  03/25/2020   Gastroesophageal reflux disease 03/25/2020   Flatulence, eructation and gas pain 03/25/2020   Family history of malignant neoplasm of gastrointestinal tract 50/93/2671   Eosinophilic esophagitis 24/58/0998   Constipation 03/25/2020   Change in bowel habit 03/25/2020   Hospital discharge follow-up 33/82/5053   Metabolic acidosis 97/67/3419   Abnormal liver function 02/06/2019   Alcohol withdrawal delirium ([place]) 02/06/2019   Elevated LFTs 07/12/2018   Healthcare maintenance 06/26/2018   Rib fracture 01/04/2018   Chest pain 12/08/2017   Leukocytosis 12/08/2017   Essential hypertension 12/08/2017   Alcohol withdrawal ([place]) 07/21/2017   Lower urinary tract infectious disease 07/21/2017   Osteopenia 08/27/2016   Alcohol dependence with withdrawal ([place]) 01/27/2016   GAD (generalized anxiety disorder) 01/27/2016   Severe episode of recurrent major depressive disorder, without psychotic features ([place]) 01/27/2016   Major depressive disorder, recurrent episode, moderate (HCC)    Alcohol use disorder, severe, dependence ([place]) 04/03/2014   Lumbosacral radiculopathy at L5 05/20/2013   Nonspecific elevation of levels of transaminase or lactic acid dehydrogenase (LDH) 03/16/2013   Hepatic steatosis 03/16/2013   Nausea and vomiting 03/16/2013   Chronic cholecystitis 03/14/2013   Cervical arthritis 10/12/2012  Palpitations 11/29/2010   SVT (supraventricular tachycardia) ([place]) 11/29/2010   Mitral valve prolapse 11/29/2010   Decreased libido 11/15/2010   GOITER, MULTINODULAR 08/17/2009   ANXIETY 08/17/2009   Migraine headache 08/17/2009   Hearing loss 08/17/2009   Osteoporosis 08/17/2009   ASYMPTOMATIC POSTMENOPAUSAL STATUS 08/17/2009   DYSPHAGIA 06/19/2007   PCP:  Lorrene Reid, PA-C Pharmacy:   [place], [address] [place] [address] Phone: [phone] Fax: [phone]  [facility] [address], [place] [address] [place] [address] Phone: [phone] Fax: [phone]  [facility] [address] [place] [address] Phone: [phone] Fax: [phone]     Social Determinants of Health (SDOH) Social History: SDOH Screenings   Depression (PHQ2-9): Low Risk  (12/07/2021)  Tobacco Use: Medium Risk (02/15/2022)   SDOH Interventions:     Readmission Risk Interventions     No data to display

## 2022-02-16 NOTE — Consult Note (Signed)
NAME:  TRENIYAH LYNN, MRN:  [id number], DOB:  02/12/1956, LOS: 1 ADMISSION DATE:  02/15/2022, CONSULTATION DATE:  02/16/22 REFERRING MD:  EDP, CHIEF COMPLAINT:  acute toxic encephalopathy   History of Present Illness:  66 yo with pmh of polysubstance abuse, gad, depression, etoh use, htn, chronic back pain who prsented with agitation and panic attack after reportedly being out of her medications and taking a thc gummy with new hallucinations. All history is obtained from chart as pt has just been given versed and is sleeping. Arousable but encephalopathic when attempting to ask questions. Ros is unobtainable.   Per chart review pt has been hospitalized multiple times over the past year for anxiety or falls while utilizing etoh or other recreational substances.     Pertinent  Medical History  Gad Depression Etoh use Polysubstance abuse  Significant Hospital Events: Including procedures, antibiotic start and stop dates in addition to other pertinent events   Admitted by trh  Interim History / Subjective:    Objective   Blood pressure 128/76, pulse (!) 101, temperature 97.9 F (36.6 C), temperature source Axillary, resp. rate 18, SpO2 94 %.       No intake or output data in the 24 hours ending 02/16/22 0254 There were no vitals filed for this visit.  Examination: General: drowsy in restraints appears disheveled HENT: NCAT eomi, perrla, mm dry but pink Lungs: ctab Cardiovascular: tachy Abdomen: soft non tender non distended bs + Extremities: no c/c/e Neuro: oriented to self only, drowsy but arousable, no focal deficits appreciated but not following commands GU: deferred  Resolved Hospital Problem list     Assessment & Plan:  Acute toxic encephalopathY:  -pt with extensive history off polysubstance use -recommend phenobarb with taper -do not recommend versed use in pt without protected airway or sz.  -no acute indication for precedex at this time.  -uds positive for  benzo, thc  Severe anxiety:  -resume home meds -prn ativan  Hypokalemia:  -replace Metabolic acidosis:  -bicarb is 16 -would provide fluids Transaminitis:  -trend -check ammonia  Best Practice (right click and "Reselect all SmartList Selections" daily)   Diet/type: NPO w/ oral meds DVT prophylaxis: LMWH GI prophylaxis: N/A Lines: N/A Foley:  Yes, and it is still needed Code Status:  full code Last date of multidisciplinary goals of care discussion [per primary]  Labs   CBC: Recent Labs  Lab 02/15/22 1630 02/16/22 0100  WBC 9.2 13.8*  NEUTROABS 7.0  --   HGB 13.7 12.2  HCT 41.6 35.3*  MCV 95.4 91.9  PLT 191 017    Basic Metabolic Panel: Recent Labs  Lab 02/15/22 1630 02/16/22 0100  NA 139  --   K 3.4*  --   CL 108  --   CO2 16*  --   GLUCOSE 97  --   BUN 13  --   CREATININE 0.89 0.78  CALCIUM 10.1  --    GFR: CrCl cannot be calculated (Unknown ideal weight.). Recent Labs  Lab 02/15/22 1630 02/16/22 0100  WBC 9.2 13.8*    Liver Function Tests: Recent Labs  Lab 02/15/22 1630  AST 159*  ALT 168*  ALKPHOS 55  BILITOT 0.9  PROT 7.7  ALBUMIN 4.3   No results for input(s): "LIPASE", "AMYLASE" in the last 168 hours. No results for input(s): "AMMONIA" in the last 168 hours.  ABG    Component Value Date/Time   HCO3 21.8 01/26/2022 0135   TCO2 22 01/26/2022 0135  ACIDBASEDEF 2.0 09/26/2020 1506   O2SAT 99 01/26/2022 0135     Coagulation Profile: No results for input(s): "INR", "PROTIME" in the last 168 hours.  Cardiac Enzymes: No results for input(s): "CKTOTAL", "CKMB", "CKMBINDEX", "TROPONINI" in the last 168 hours.  HbA1C: Hgb A1c MFr Bld  Date/Time Value Ref Range Status  11/21/2020 10:21 AM 4.7 (L) 4.8 - 5.6 % Final    Comment:    (NOTE) Pre diabetes:          5.7%-6.4%  Diabetes:              >6.4%  Glycemic control for   <7.0% adults with diabetes   01/10/2020 10:38 AM 4.7 (L) 4.8 - 5.6 % Final    Comment:              Prediabetes: 5.7 - 6.4          Diabetes: >6.4          Glycemic control for adults with diabetes: <7.0     CBG: No results for input(s): "GLUCAP" in the last 168 hours.  Review of Systems:   unobtainable  Past Medical History:  She,  has a past medical history of Alcohol withdrawal ([place]) (11/2017; 01/04/2018), Alcoholism ([place]), Anxiety, Arthritis, ASYMPTOMATIC POSTMENOPAUSAL STATUS (08/17/2009), Chronic lower back pain, Depression, DYSPHAGIA (06/19/2007), Fatty liver, alcoholic, GERD (gastroesophageal reflux disease), GOITER, MULTINODULAR (08/17/2009), Hyperlipidemia, Hypertension, Migraines, Mitral valve prolapse, OSTEOPOROSIS (08/17/2009), PONV (postoperative nausea and vomiting), and Supraventricular tachycardia.   Surgical History:   Past Surgical History:  Procedure Laterality Date   BREAST BIOPSY Right    "benign"   CATARACT EXTRACTION W/ INTRAOCULAR LENS  IMPLANT, BILATERAL     CERVICAL CONE BIOPSY  2000s   CERVIX LESION DESTRUCTION  1991   "dysplasia; lesions"   CHOLECYSTECTOMY N/A 03/14/2013   Procedure: LAPAROSCOPIC CHOLECYSTECTOMY WITH ATTEMPTED INTRAOPERATIVE CHOLANGIOGRAM;  Surgeon: Harl Bowie, MD;  Location: [facility];  Service: General;  Laterality: N/A;   HARDWARE REMOVAL Left 07/22/2021   Procedure: HARDWARE REMOVAL;  Surgeon: Altamese Starr, MD;  Location: [facility];  Service: Orthopedics;  Laterality: Left;   laporoscopic abdominal surgery     "for endometriosis"   LEFT HEART CATH AND CORONARY ANGIOGRAPHY N/A 05/07/2018   Procedure: LEFT HEART CATH AND CORONARY ANGIOGRAPHY;  Surgeon: Belva Crome, MD;  Location: [facility];  Service: Cardiovascular;  Laterality: N/A;   ORIF ANKLE FRACTURE Left 02/11/2021   Procedure: OPEN REDUCTION INTERNAL FIXATION (ORIF) ANKLE FRACTURE;  Surgeon: Altamese Campo, MD;  Location: [facility];  Service: Orthopedics;  Laterality: Left;   TONSILLECTOMY       Social History:   reports that she quit smoking about 48 years ago.  Her smoking use included cigarettes. She has a 7.00 pack-year smoking history. She has never used smokeless tobacco. She reports that she does not currently use alcohol after a past usage of about 28.0 standard drinks of alcohol per week. She reports that she does not use drugs.   Family History:  Her family history includes Colon cancer in her father; Healthy in her brother, brother, son, and son; Osteoporosis in her mother. There is no history of Thyroid disease or Goiter.   Allergies Allergies  Allergen Reactions   Doxycycline Swelling    Mouth swelling and sores   Codeine Itching   Tetracycline Hcl Swelling    Mouth swelling and sores     Home Medications  Prior to Admission medications   Medication Sig Start Date End Date  Taking? Authorizing Provider  acetaminophen (TYLENOL) 500 MG tablet Take 500 mg by mouth every 8 (eight) hours as needed.    [provider]  albuterol (VENTOLIN HFA) 108 (90 Base) MCG/ACT inhaler INHALE 2 PUFFS INTO THE LUNGS EVERY 6 HOURS AS NEEDED FOR WHEEZING OR SHORTNESS OF BREATH. 07/02/21   Lorrene Reid, PA-C  alendronate (FOSAMAX) 70 MG tablet Take 70 mg by mouth every Monday.    [provider]  ampicillin (PRINCIPEN) 500 MG capsule Take 500 mg by mouth daily. 09/03/21   [provider]  Azelaic Acid 15 % gel     [provider]  buPROPion ER (WELLBUTRIN SR) 100 MG 12 hr tablet Take 1 tablet (100 mg total) by mouth 2 (two) times daily. 12/17/21 03/17/22  Dara Hoyer, PA-C  busPIRone (BUSPAR) 15 MG tablet Take 1 tablet (15 mg total) by mouth 3 (three) times daily. 12/17/21   Dara Hoyer, PA-C  Calcium Carbonate-Vitamin D (CALCIUM 600+D PO) Take 1 tablet by mouth daily.    [provider]  carvedilol (COREG) 6.25 MG tablet Take 1 tablet (6.25 mg total) by mouth 2 (two) times daily with a meal. 12/07/21   Abonza, [place], PA-C  CLENPIQ 10-3.5-12 MG-GM -GM/175ML SOLN SMARTSIG:175 Milliliter(s) By Mouth Twice a  Week 12/09/21   [provider]  clindamycin (CLINDAGEL) 1 % gel     [provider]  Dermatological Products, [place] ROSACEA EX) Apply topically Once daily for 30 09/03/21 03/01/22  [provider]  diclofenac Sodium (VOLTAREN) 1 % GEL Apply 2 g 4 times a day by topical route.    [provider]  fluocinonide (LIDEX) 0.05 % external solution     [provider]  fluticasone (FLONASE) 50 MCG/ACT nasal spray Place 2 sprays into both nostrils daily as needed for allergies.    [provider]  fluticasone (FLOVENT HFA) 110 MCG/ACT inhaler 2 puff(s) Puff and swallow/ not for asthma, for EOE 2 times a day for 30 day(s) 11/03/21   [provider]  folic acid (FOLVITE) 1 MG tablet TAKE 1 TABLET BY MOUTH DAILY. 01/05/22   Lorrene Reid, PA-C  gabapentin (NEURONTIN) 300 MG capsule Take 1 capsule twice a day by oral route.    [provider]  hydrocortisone 2.5 % cream hydrocortisone 2.5 % topical cream  APPLY TO AFFECTED AREA TWICE A DAY FOR 14 DAYS    [provider]  ketoconazole (NIZORAL) 2 % shampoo APPLY 5 MLS TO AFFECTED AREA 2 TO 3 TIMES A WEEK for 30    [provider]  meloxicam (MOBIC) 15 MG tablet Take 1 tablet (15 mg total) by mouth daily as needed for pain. 08/03/21   Lorrene Reid, PA-C  Multiple Vitamins-Minerals (ADULT GUMMY PO) Take 2 capsules by mouth daily.    [provider]  norethindrone-ethinyl estradiol (FEMHRT 1/5) 1-5 MG-MCG TABS tablet Take 1 tablet by mouth daily.    [provider]  ondansetron (ZOFRAN-ODT) 8 MG disintegrating tablet     [provider]  pantoprazole (PROTONIX) 40 MG tablet TAKE 1 TABLET BY MOUTH 2 TIMES DAILY. 01/11/21   Lorrene Reid, PA-C  SUMAtriptan (IMITREX) 100 MG tablet Take 1 tablet (100 mg total) by mouth every 2 (two) hours as needed for migraine. May repeat in 2 hours if headache persists or recurs. 12/07/21   Lorrene Reid,  PA-C  thiamine (VITAMIN B-1) 100 MG tablet Take 1 tablet every day by oral route.    [provider]  traZODone (DESYREL) 50 MG tablet Take 50 mg by mouth at bedtime.    [provider]  triamcinolone cream (KENALOG) 0.1 % APPLY TO AFFECTED AREA TWICE A DAY    [provider]     Critical care time: 84

## 2022-02-16 NOTE — Progress Notes (Addendum)
   NAME:  Terri Ayala, MRN:  [id number], DOB:  Dec 03, 1955, LOS: 1 ADMISSION DATE:  02/15/2022, CONSULTATION DATE:  02/16/22 REFERRING MD:  EDP, CHIEF COMPLAINT:  acute toxic encephalopathy   History of Present Illness:  66 yo with pmh of polysubstance abuse, gad, depression, etoh use, htn, chronic back pain who prsented with agitation and panic attack after reportedly being out of her medications and taking a thc gummy with new hallucinations. All history is obtained from chart as pt has just been given versed and is sleeping. Arousable but encephalopathic when attempting to ask questions. Ros is unobtainable.   Per chart review pt has been hospitalized multiple times over the past year for anxiety or falls while utilizing etoh or other recreational substances.    Pertinent  Medical History  Gad Depression Etoh use Polysubstance abuse  Significant Hospital Events: Including procedures, antibiotic start and stop dates in addition to other pertinent events   Admitted by trh  Interim History / Subjective:  Sleepy, in 4 point restraints, gets agitated if stimulated. Able to tell me her name and where she is but is otherwise confused. Received '65mg'$  Phenobarb around 4AM.  Objective   Blood pressure (!) 169/91, pulse (!) 126, temperature 97.9 F (36.6 C), temperature source Axillary, resp. rate (!) 29, SpO2 100 %.       No intake or output data in the 24 hours ending 02/16/22 0739 There were no vitals filed for this visit.  Examination: General: Adult female, resting in bed, in NAD. Neuro: Awakens to voice but is confused besides to person and place. MAE's. HEENT: [place]/AT. Sclerae anicteric. EOMI. Cardiovascular: RRR, no M/R/G.  Lungs: Respirations even and unlabored.  CTA bilaterally, No W/R/R. Abdomen: BS x 4, soft, NT/ND.  Musculoskeletal: No gross deformities, no edema.  Skin: Intact, warm, no rashes.   Assessment & Plan:   Acute toxic encephalopathy - unclear etiology but  presumed polypharmacy and polysubstance abuse. UDS positive for benzo's and THC, ethanol negative but does have hx of EtOH dependence. - Ativan per CIWA protocol. - Can add additional doses of Phenobarb if needed but will hold off for now. - Can add on Haldol PRN agitation. Will check QTc first. - Substance abuse counseling when able. - F/u on TSH, B12.  Hypokalemia - s/p 1 run K overnight. Hypomagnesemia. - Additional 6 runs K now. - 2g Mag. - Follow BMP.  Transaminitis. - Trend LFT's intermittently.   Continue supportive care. Nothing further to add at this point in time. Please call back if we can be of further assistance.  Best Practice (right click and "Reselect all SmartList Selections" daily)  Per TRH.   Montey Hora, [facility] For pager details, please see AMION or use Epic chat  After 1900, please call [facility] for cross coverage needs 02/16/2022, 7:49 AM

## 2022-02-16 NOTE — Hospital Course (Addendum)
42 yof w/ GAD/chronic depression, former alcohol user, essential hypertension, chronic back pain alcohol abuse brought to the ED with agitation. Reportedly being out of her medication taking THC gummy and having hallucination.  She has had multiple hospitalization over the past year for anxiety or falls while using alcohol or other recreational substances. In the ED, tachycardic, BP stable, afebrile.  Labs with hypokalemia bicarb on lower side, mild leukocytosis.  UDS positive for benzodiazepine and THC UA negative for infection, positive for ketones Patient received multiple rounds of benzodiazepine without improvement, admitted to stepdown, critical care consulted

## 2022-02-16 NOTE — Progress Notes (Signed)
PCCM Interval Progress Note  Some ongoing agitation and delirium this afternoon. Received '5mg'$  Ativan with transient response. Hoping to avoid escalating doses of benzo's or continuous infusions for now.  Will add on low dose Phenobarb taper with continued Ativan per CIWA. If not effective, then TRH cross coverage to please notify [facility] MD overnight who can order Precedex infusion and have PCCM team follow up 12/21 if needed.   Montey Hora, [facility] For pager details, please see AMION or use Epic chat  After 1900, please call [facility] for cross coverage needs 02/16/2022, 4:13 PM

## 2022-02-16 NOTE — Progress Notes (Signed)
PROGRESS NOTE Terri Ayala  FXT:[id number] DOB: 1955-05-06 DOA: 02/15/2022 PCP: Lorrene Reid, PA-C   Brief Narrative/Hospital Course: 27 yof w/ GAD/chronic depression, former alcohol user, essential hypertension, chronic back pain alcohol abuse brought to the ED with agitation. Reportedly being out of her medication taking THC gummy and having hallucination.  She has had multiple hospitalization over the past year for anxiety or falls while using alcohol or other recreational substances. In the ED, tachycardic, BP stable, afebrile.  Labs with hypokalemia bicarb on lower side, mild leukocytosis.  UDS positive for benzodiazepine and THC UA negative for infection, positive for ketones Patient received multiple rounds of benzodiazepine without improvement, admitted to stepdown, critical care consulted    Subjective: Seen and examined this morning more lethargic we will workup tell me her name and that she is in the hospital initially said more discount. Follows some command, remained to be restrained as he started to get agitated   Assessment and Plan: Principal Problem:   Acute metabolic encephalopathy Acute toxic encephalopathy with agitation hallucination Extensive history of polysubstance abuse UDS positive for THC and benzos GAD/depression: Unclear etiology UDS with positive benzos and THC present polypharmacy and polysubstance abuse, no evidence of acute infection.  Ethanol negative.  On Ativan per CIWA protocol.  Holding further phenobarbital, this morning arousable but gets agitated easily.continue sedation-management as per critical care. Not on Precedex currently.  No obvious evidence of infection or UTI, ammonia normal. Check tsh/b12. consulted psychiatry  Hypokalemia repleted aggressively by PCCM Recent Labs  Lab 02/15/22 1630 02/16/22 0324  K 3.4* 2.8*    Mild metabolic acidosis bicarb improved on gentle IV fluids Transaminitis trend LFTs, Improving, unclear  etiology. Recent Labs  Lab 02/15/22 1630 02/16/22 0324  AST 159* 100*  ALT 168* 125*  ALKPHOS 55 42  BILITOT 0.9 1.3*  PROT 7.7 6.6  ALBUMIN 4.3 3.6    Mild leukocytosis no evidence of UTI, no respiratory symptoms.  Monitor Severe anxiety disorder resume home meds once med rec completed.  Consult psychiatry Alcohol abuse continue CIWA scale Ativan  DVT prophylaxis: enoxaparin (LOVENOX) injection 40 mg Start: 02/16/22 1000 Code Status:   Code Status: Full Code Family Communication: plan of care discussed with patient at bedside. Patient status is: Inpatient because of encephalopathy Level of care: Stepdown   Dispo: The patient is from: home            Anticipated disposition:  TBDObjective: Vitals last 24 hrs: Vitals:   02/16/22 0500 02/16/22 0600 02/16/22 0700 02/16/22 0837  BP: (!) 169/91     Pulse: (!) 122 (!) 117 (!) 126   Resp: (!) 22 17 (!) 29   Temp:    98.8 F (37.1 C)  TempSrc:    Oral  SpO2: 100% 100% 100%    Weight change:   Physical Examination: General exam: alert awake,older than stated age HEENT:Oral mucosa moist,Ear/Nose WNL grossly Respiratory system: bilaterally clear,no use of accessory muscle Cardiovascular system: S1 & S2 +, No JVD. Gastrointestinal system: Abdomen soft,NT,ND, BS+ Nervous System:Alert, awake, oriented x 1-2, moving all her extremities,agitated easily and on restraints. Extremities: LE edema NEG,distal peripheral pulses palpable.  Skin: No rashes,no icterus. MSK: Normal muscle bulk,tone, power  Medications reviewed:  Scheduled Meds:  Chlorhexidine Gluconate Cloth  6 each Topical Daily   enoxaparin (LOVENOX) injection  40 mg Subcutaneous G99M   folic acid  1 mg Oral Daily   multivitamin with minerals  1 tablet Oral Daily   potassium chloride  40 mEq  Oral Q4H   thiamine  100 mg Oral Daily   Or   thiamine  100 mg Intravenous Daily  Continuous Infusions:  lactated ringers 75 mL/hr at 02/16/22 0325    Diet Order              Diet NPO time specified Except for: Sips with Meds  Diet effective now                 No intake or output data in the 24 hours ending 02/16/22 1102 Net IO Since Admission: No IO data has been entered for this period [02/16/22 1102]  Wt Readings from Last 3 Encounters:  01/29/22 60.3 kg  12/07/21 60.8 kg  08/20/21 65.3 kg    Unresulted Labs (From admission, onward)     Start     Ordered   02/23/22 0500  Creatinine, serum  (enoxaparin (LOVENOX)    CrCl >/= 30 ml/min)  Weekly,   R     Comments: while on enoxaparin therapy   Question:  Specimen collection method  Answer:  Lab=Lab collect   02/16/22 0018   02/17/22 0500  Comprehensive metabolic panel  Daily,   R     Question:  Specimen collection method  Answer:  Lab=Lab collect   02/16/22 0741   02/17/22 0500  CBC  Daily,   R     Question:  Specimen collection method  Answer:  Lab=Lab collect   02/16/22 0741          Data Reviewed: I have personally reviewed following labs and imaging studies CBC: Recent Labs  Lab 02/15/22 1630 02/16/22 0100 02/16/22 0324  WBC 9.2 13.8* 12.9*  NEUTROABS 7.0  --   --   HGB 13.7 12.2 12.2  HCT 41.6 35.3* 35.6*  MCV 95.4 91.9 93.0  PLT 191 220 169  Basic Metabolic Panel: Recent Labs  Lab 02/15/22 1630 02/16/22 0100 02/16/22 0324  NA 139  --  141  K 3.4*  --  2.8*  CL 108  --  108  CO2 16*  --  22  GLUCOSE 97  --  110*  BUN 13  --  12  CREATININE 0.89 0.78 0.74  CALCIUM 10.1  --  8.8*  MG  --   --  1.7  PHOS  --   --  3.3  GFR: CrCl cannot be calculated (Unknown ideal weight.). Liver Function Tests: Recent Labs  Lab 02/15/22 1630 02/16/22 0324  AST 159* 100*  ALT 168* 125*  ALKPHOS 55 42  BILITOT 0.9 1.3*  PROT 7.7 6.6  ALBUMIN 4.3 3.6   No results for input(s): "LIPASE", "AMYLASE" in the last 168 hours. Recent Labs  Lab 02/16/22 0324  AMMONIA 20   Recent Labs    02/16/22 0824  TSH 3.018   Sepsis Labs: No results for input(s): "PROCALCITON",  "LATICACIDVEN" in the last 168 hours.  Recent Results (from the past 240 hour(s))  MRSA Next Gen by PCR, Nasal     Status: None   Collection Time: 02/15/22 11:43 PM   Specimen: Nasal Mucosa; Nasal Swab  Result Value Ref Range Status   MRSA by PCR Next Gen NOT DETECTED NOT DETECTED Final    Comment: (NOTE) The GeneXpert MRSA Assay (FDA approved for NASAL specimens only), is one component of a comprehensive MRSA colonization surveillance program. It is not intended to diagnose MRSA infection nor to guide or monitor treatment for MRSA infections. Test performance is not FDA approved in patients less than  37 years old. Performed at [facility], [place] [address], [place], [place] [address]     Antimicrobials: Anti-infectives (From admission, onward)    None      Culture/Microbiology    Component Value Date/Time   SDES BLOOD RIGHT ARM 09/26/2020 1444   SPECREQUEST  09/26/2020 1444    BOTTLES DRAWN AEROBIC AND ANAEROBIC Blood Culture adequate volume   CULT  09/26/2020 1444    NO GROWTH 5 DAYS Performed at [facility], [place] [address], [place], [place] [address]    REPTSTATUS 10/01/2020 FINAL 09/26/2020 1444  Radiology Studies: No results found.   LOS: 1 day   Antonieta Pert, MD [facility]  02/16/2022, 11:02 AM

## 2022-02-17 ENCOUNTER — Inpatient Hospital Stay (HOSPITAL_COMMUNITY): Payer: Medicare Other

## 2022-02-17 DIAGNOSIS — G9341 Metabolic encephalopathy: Secondary | ICD-10-CM | POA: Diagnosis not present

## 2022-02-17 LAB — CBC
HCT: 35.1 % — ABNORMAL LOW (ref 36.0–46.0)
Hemoglobin: 11.7 g/dL — ABNORMAL LOW (ref 12.0–15.0)
MCH: 31.7 pg (ref 26.0–34.0)
MCHC: 33.3 g/dL (ref 30.0–36.0)
MCV: 95.1 fL (ref 80.0–100.0)
Platelets: 203 10*3/uL (ref 150–400)
RBC: 3.69 MIL/uL — ABNORMAL LOW (ref 3.87–5.11)
RDW: 14.3 % (ref 11.5–15.5)
WBC: 13.1 10*3/uL — ABNORMAL HIGH (ref 4.0–10.5)
nRBC: 0 % (ref 0.0–0.2)

## 2022-02-17 LAB — COMPREHENSIVE METABOLIC PANEL
ALT: 98 U/L — ABNORMAL HIGH (ref 0–44)
AST: 77 U/L — ABNORMAL HIGH (ref 15–41)
Albumin: 3.5 g/dL (ref 3.5–5.0)
Alkaline Phosphatase: 39 U/L (ref 38–126)
Anion gap: 9 (ref 5–15)
BUN: 6 mg/dL — ABNORMAL LOW (ref 8–23)
CO2: 21 mmol/L — ABNORMAL LOW (ref 22–32)
Calcium: 8.4 mg/dL — ABNORMAL LOW (ref 8.9–10.3)
Chloride: 108 mmol/L (ref 98–111)
Creatinine, Ser: 0.61 mg/dL (ref 0.44–1.00)
GFR, Estimated: >60 mL/min (ref >60)
Glucose, Bld: 101 mg/dL — ABNORMAL HIGH (ref 70–99)
Potassium: 3.7 mmol/L (ref 3.5–5.1)
Sodium: 138 mmol/L (ref 135–145)
Total Bilirubin: 0.8 mg/dL (ref 0.3–1.2)
Total Protein: 6 g/dL — ABNORMAL LOW (ref 6.5–8.1)

## 2022-02-17 LAB — MAGNESIUM: Magnesium: 1.8 mg/dL (ref 1.7–2.4)

## 2022-02-17 LAB — FOLATE: Folate: 13.4 ng/mL (ref >5.9)

## 2022-02-17 LAB — PHOSPHORUS: Phosphorus: 2.7 mg/dL (ref 2.5–4.6)

## 2022-02-17 MED ORDER — GUAIFENESIN 100 MG/5ML PO LIQD
5.0000 mL | ORAL | Status: DC | PRN
  Administered 2022-02-21: 5 mL via ORAL
  Filled 2022-02-17: qty 10

## 2022-02-17 MED ORDER — ONDANSETRON HCL 4 MG/2ML IJ SOLN: 4.0000 mg | Freq: Four times a day (QID) | INTRAMUSCULAR | Status: DC | PRN

## 2022-02-17 MED ORDER — ACETAMINOPHEN 325 MG PO TABS
650.0000 mg | ORAL_TABLET | Freq: Four times a day (QID) | ORAL | Status: DC | PRN
  Administered 2022-02-18 – 2022-02-22 (×8): 650 mg via ORAL
  Filled 2022-02-17 (×9): qty 2

## 2022-02-17 MED ORDER — SODIUM CHLORIDE 0.9 % IV SOLN: INTRAVENOUS | Status: AC

## 2022-02-17 MED ORDER — METOPROLOL TARTRATE 5 MG/5ML IV SOLN: 5.0000 mg | INTRAVENOUS | Status: DC | PRN

## 2022-02-17 MED ORDER — DEXMEDETOMIDINE HCL IN NACL 200 MCG/50ML IV SOLN
0.2000 ug/kg/h | INTRAVENOUS | Status: DC
  Administered 2022-02-17 (×2): 0.7 ug/kg/h via INTRAVENOUS
  Administered 2022-02-17: 0.2 ug/kg/h via INTRAVENOUS
  Administered 2022-02-17: 0.7 ug/kg/h via INTRAVENOUS
  Administered 2022-02-17: 0.6 ug/kg/h via INTRAVENOUS
  Administered 2022-02-18: 0.7 ug/kg/h via INTRAVENOUS
  Filled 2022-02-17 (×6): qty 50

## 2022-02-17 MED ORDER — SENNOSIDES-DOCUSATE SODIUM 8.6-50 MG PO TABS: 1.0000 | ORAL_TABLET | Freq: Every evening | ORAL | Status: DC | PRN

## 2022-02-17 MED ORDER — HYDRALAZINE HCL 20 MG/ML IJ SOLN
10.0000 mg | INTRAMUSCULAR | Status: DC | PRN
  Administered 2022-02-17: 10 mg via INTRAVENOUS
  Filled 2022-02-17: qty 1

## 2022-02-17 MED ORDER — IPRATROPIUM-ALBUTEROL 0.5-2.5 (3) MG/3ML IN SOLN: 3.0000 mL | RESPIRATORY_TRACT | Status: DC | PRN

## 2022-02-17 NOTE — Progress Notes (Signed)
Bladder scan revealed no urine.

## 2022-02-17 NOTE — Progress Notes (Signed)
PROGRESS NOTE    Terri Ayala  UKG:[id number] DOB: 04-22-1955 DOA: 02/15/2022 PCP: Lorrene Reid, PA-C   Brief Narrative:  66 year old with history of GAD, chronic depression, former alcohol use, HTN, chronic back pain brought to the ED for agitation.  Patient reported of being out of her medications and taking THC gummy and having hallucinations.  UDS was positive for benzodiazepine and THC.  UA was negative.  Lab work showed hypokalemia and slight metabolic acidosis.  Due to severe agitation, patient was started on phenobarb taper.  Psychiatry team was consulted.   Assessment & Plan:  Principal Problem:   Acute metabolic encephalopathy   Acute toxic encephalopathy with agitation hallucination Extensive history of polysubstance abuse UDS positive for THC and benzos GAD/depression: Likely polypharmacy or withdrawal from polysubstance abuse.  No obvious evidence of active infection.  Currently on CIWA with IV Ativan.  Also on phenobarbital for agitation. Ammonia level normal THC, B12-normal Psychiatry consulted Check folate Bladder scan CT head ordered for completeness of workup.  May need to get neurology involved.   Hypokalemia  Replete as necessary   Mild metabolic acidosis  bicarb improved on gentle IV fluids  Transaminitis  trend LFTs, Improving, unclear etiology.   Mild leukocytosis  no evidence of UTI, no respiratory symptoms.  Monitor  Severe anxiety disorder  resume home meds once med rec completed.  Consult psychiatry  Alcohol abuse  continue CIWA scale Ativan    DVT prophylaxis: Lovenox Code Status: Full code Family Communication:    Status is: Inpatient Maintain hospital stay due to severe encephalopathy and agitation.  Ongoing evaluation    Subjective: Patient seen and Ament at bedside, trying to get out of bed and very agitated.  Patient did receive Ativan earlier today.   Examination:  General exam: Not in acute distress but  agitated Respiratory system: Clear to auscultation. Respiratory effort normal. Cardiovascular system: S1 & S2 heard, RRR. No JVD, murmurs, rubs, gallops or clicks. No pedal edema. Gastrointestinal system: Abdomen is nondistended, soft and nontender. No organomegaly or masses felt. Normal bowel sounds heard. Central nervous system: Mostly nonverbal.  No focal neurodeficits Extremities: Grossly moving all the extremities.  Restraints in place Skin: No rashes, lesions or ulcers Psychiatry: Judgement and insight poor, agitated   Objective: Vitals:   02/17/22 0400 02/17/22 0600 02/17/22 0700 02/17/22 0741  BP: (!) 139/108 (!) 157/64 (!) 139/46   Pulse: (!) 106 76 84   Resp: 10 (!) 22 (!) 22   Temp:    98.7 F (37.1 C)  TempSrc:    Axillary  SpO2: 98% 100% 98%   Weight:        Intake/Output Summary (Last 24 hours) at 02/17/2022 0743 Last data filed at 02/17/2022 0700 Gross per 24 hour  Intake 1403.91 ml  Output 650 ml  Net 753.91 ml   Filed Weights   02/17/22 0306  Weight: 56.3 kg     Data Reviewed:   CBC: Recent Labs  Lab 02/15/22 1630 02/16/22 0100 02/16/22 0324 02/17/22 0242  WBC 9.2 13.8* 12.9* 13.1*  NEUTROABS 7.0  --   --   --   HGB 13.7 12.2 12.2 11.7*  HCT 41.6 35.3* 35.6* 35.1*  MCV 95.4 91.9 93.0 95.1  PLT 191 220 217 762   Basic Metabolic Panel: Recent Labs  Lab 02/15/22 1630 02/16/22 0100 02/16/22 0324 02/16/22 1641  NA 139  --  141 136  K 3.4*  --  2.8* 2.9*  CL 108  --  108  109  CO2 16*  --  22 21*  GLUCOSE 97  --  110* 92  BUN 13  --  12 9  CREATININE 0.89 0.78 0.74 0.73  CALCIUM 10.1  --  8.8* 8.6*  MG  --   --  1.7 1.9  PHOS  --   --  3.3  --    GFR: Estimated Creatinine Clearance: 54 mL/min (by C-G formula based on SCr of 0.73 mg/dL). Liver Function Tests: Recent Labs  Lab 02/15/22 1630 02/16/22 0324  AST 159* 100*  ALT 168* 125*  ALKPHOS 55 42  BILITOT 0.9 1.3*  PROT 7.7 6.6  ALBUMIN 4.3 3.6   No results for input(s):  "LIPASE", "AMYLASE" in the last 168 hours. Recent Labs  Lab 02/16/22 0324  AMMONIA 20   Coagulation Profile: No results for input(s): "INR", "PROTIME" in the last 168 hours. Cardiac Enzymes: No results for input(s): "CKTOTAL", "CKMB", "CKMBINDEX", "TROPONINI" in the last 168 hours. BNP (last 3 results) No results for input(s): "PROBNP" in the last 8760 hours. HbA1C: No results for input(s): "HGBA1C" in the last 72 hours. CBG: No results for input(s): "GLUCAP" in the last 168 hours. Lipid Profile: No results for input(s): "CHOL", "HDL", "LDLCALC", "TRIG", "CHOLHDL", "LDLDIRECT" in the last 72 hours. Thyroid Function Tests: Recent Labs    02/16/22 0824  TSH 3.018   Anemia Panel: Recent Labs    02/16/22 0824  VITAMINB12 2,258*   Sepsis Labs: No results for input(s): "PROCALCITON", "LATICACIDVEN" in the last 168 hours.  Recent Results (from the past 240 hour(s))  MRSA Next Gen by PCR, Nasal     Status: None   Collection Time: 02/15/22 11:43 PM   Specimen: Nasal Mucosa; Nasal Swab  Result Value Ref Range Status   MRSA by PCR Next Gen NOT DETECTED NOT DETECTED Final    Comment: (NOTE) The GeneXpert MRSA Assay (FDA approved for NASAL specimens only), is one component of a comprehensive MRSA colonization surveillance program. It is not intended to diagnose MRSA infection nor to guide or monitor treatment for MRSA infections. Test performance is not FDA approved in patients less than 67 years old. Performed at [facility], [place] [address], [place], [place] [address]          Radiology Studies: No results found.      Scheduled Meds:  buPROPion ER  100 mg Oral BID   busPIRone  15 mg Oral TID   carvedilol  6.25 mg Oral BID WC   Chlorhexidine Gluconate Cloth  6 each Topical Daily   enoxaparin (LOVENOX) injection  40 mg Subcutaneous L89H   folic acid  1 mg Oral Daily   multivitamin with minerals  1 tablet Oral Daily   PHENObarbital  65 mg  Intravenous Q8H   Followed by   Derrill Memo ON 02/18/2022] PHENObarbital  32.5 mg Intravenous Q8H   thiamine  100 mg Oral Daily   Or   thiamine  100 mg Intravenous Daily   Continuous Infusions:  dexmedetomidine (PRECEDEX) IV infusion 0.6 mcg/kg/hr (02/17/22 0700)   lactated ringers 75 mL/hr at 02/17/22 0700     LOS: 2 days   Time spent= 35 mins    Abbeygail Igoe Arsenio Loader, MD [facility]  If 7PM-7AM, please contact night-coverage  02/17/2022, 7:43 AM

## 2022-02-17 NOTE — Consult Note (Signed)
Brief Psychiatry Consult Note  Attempted to assess patient, and she remains A&O x 0. She is observed to be lying in the bed with bilateral soft wrist restraints. She is unable to engage in conversation at this time. Patient is currently being managed by our critical care team and hospitalists.   At this time we are unable to assess patient and will sign off. Once patient is able to participate in psychiatric evaluation, and if services are still warranted please re consult.  -Consider consulting neurology.  -Continue current medications at this time.   Psychiatry will sign off at this time.   I personally spent 20 minutes on the unit in direct patient care. The direct patient care time included face-to-face time with the patient, reviewing the patient's chart, communicating with other professionals, and coordinating care. Greater than 50% of this time was spent in counseling or coordinating care with the patient regarding goals of hospitalization, psycho-education, and discharge planning needs.   Suella Broad, FNP

## 2022-02-17 NOTE — Progress Notes (Signed)
eLink Physician-Brief Progress Note Patient Name: Terri Ayala DOB: Jan 03, 1956 MRN: [id number]   Date of Service  02/17/2022  HPI/Events of Note  Pt still very restless & agitated despite phenobarb & ativn. Bedside asking for Precedex   Bilat wrist restraints requested  Patient seen restless in bed  eICU Interventions  Precedex per ETOH withdrawal order set ordered Bilateral soft wrist restraints ordered Bedside team to assess in am if restraints to be continued     Intervention Category Minor Interventions: Agitation / anxiety - evaluation and management  Judd Lien 02/17/2022, 2:43 AM

## 2022-02-18 ENCOUNTER — Other Ambulatory Visit: Payer: Self-pay

## 2022-02-18 ENCOUNTER — Ambulatory Visit: Payer: Medicare Other | Admitting: Nurse Practitioner

## 2022-02-18 DIAGNOSIS — G9341 Metabolic encephalopathy: Secondary | ICD-10-CM | POA: Diagnosis not present

## 2022-02-18 LAB — COMPREHENSIVE METABOLIC PANEL
ALT: 87 U/L — ABNORMAL HIGH (ref 0–44)
AST: 61 U/L — ABNORMAL HIGH (ref 15–41)
Albumin: 3.4 g/dL — ABNORMAL LOW (ref 3.5–5.0)
Alkaline Phosphatase: 40 U/L (ref 38–126)
Anion gap: 12 (ref 5–15)
BUN: 6 mg/dL — ABNORMAL LOW (ref 8–23)
CO2: 18 mmol/L — ABNORMAL LOW (ref 22–32)
Calcium: 8.1 mg/dL — ABNORMAL LOW (ref 8.9–10.3)
Chloride: 104 mmol/L (ref 98–111)
Creatinine, Ser: 0.65 mg/dL (ref 0.44–1.00)
GFR, Estimated: >60 mL/min (ref >60)
Glucose, Bld: 78 mg/dL (ref 70–99)
Potassium: 3.1 mmol/L — ABNORMAL LOW (ref 3.5–5.1)
Sodium: 134 mmol/L — ABNORMAL LOW (ref 135–145)
Total Bilirubin: 1 mg/dL (ref 0.3–1.2)
Total Protein: 6.2 g/dL — ABNORMAL LOW (ref 6.5–8.1)

## 2022-02-18 LAB — GLUCOSE, CAPILLARY
Glucose-Capillary: 77 mg/dL (ref 70–99)
Glucose-Capillary: 78 mg/dL (ref 70–99)
Glucose-Capillary: 80 mg/dL (ref 70–99)
Glucose-Capillary: 81 mg/dL (ref 70–99)

## 2022-02-18 LAB — CBC
HCT: 37.2 % (ref 36.0–46.0)
Hemoglobin: 12 g/dL (ref 12.0–15.0)
MCH: 31.9 pg (ref 26.0–34.0)
MCHC: 32.3 g/dL (ref 30.0–36.0)
MCV: 98.9 fL (ref 80.0–100.0)
Platelets: 173 10*3/uL (ref 150–400)
RBC: 3.76 MIL/uL — ABNORMAL LOW (ref 3.87–5.11)
RDW: 14.1 % (ref 11.5–15.5)
WBC: 9.2 10*3/uL (ref 4.0–10.5)
nRBC: 0 % (ref 0.0–0.2)

## 2022-02-18 LAB — MAGNESIUM: Magnesium: 1.7 mg/dL (ref 1.7–2.4)

## 2022-02-18 MED ORDER — SUMATRIPTAN SUCCINATE 50 MG PO TABS
100.0000 mg | ORAL_TABLET | Freq: Once | ORAL | Status: AC
  Administered 2022-02-18: 100 mg via ORAL
  Filled 2022-02-18: qty 2

## 2022-02-18 MED ORDER — POTASSIUM CHLORIDE CRYS ER 20 MEQ PO TBCR
40.0000 meq | EXTENDED_RELEASE_TABLET | Freq: Once | ORAL | Status: AC
  Administered 2022-02-18: 40 meq via ORAL
  Filled 2022-02-18: qty 2

## 2022-02-18 MED ORDER — POTASSIUM CHLORIDE 10 MEQ/100ML IV SOLN: 10.0000 meq | INTRAVENOUS | Status: DC

## 2022-02-18 MED ORDER — LORAZEPAM 1 MG PO TABS
1.0000 mg | ORAL_TABLET | Freq: Four times a day (QID) | ORAL | Status: DC | PRN
  Administered 2022-02-18 – 2022-02-21 (×10): 1 mg via ORAL
  Filled 2022-02-18 (×10): qty 1

## 2022-02-18 MED ORDER — ONDANSETRON HCL 4 MG PO TABS
4.0000 mg | ORAL_TABLET | Freq: Four times a day (QID) | ORAL | Status: DC | PRN
  Administered 2022-02-20: 4 mg via ORAL
  Filled 2022-02-18: qty 1

## 2022-02-18 MED ORDER — MELATONIN 3 MG PO TABS
6.0000 mg | ORAL_TABLET | Freq: Every evening | ORAL | Status: DC | PRN
  Administered 2022-02-18 – 2022-02-23 (×6): 6 mg via ORAL
  Filled 2022-02-18 (×6): qty 2

## 2022-02-18 MED ORDER — PHENOBARBITAL 32.4 MG PO TABS
32.4000 mg | ORAL_TABLET | Freq: Three times a day (TID) | ORAL | Status: AC
  Administered 2022-02-18 – 2022-02-20 (×5): 32.4 mg via ORAL
  Filled 2022-02-18 (×6): qty 1

## 2022-02-18 MED ORDER — MAGNESIUM SULFATE 2 GM/50ML IV SOLN
2.0000 g | Freq: Once | INTRAVENOUS | Status: AC
  Administered 2022-02-18: 2 g via INTRAVENOUS
  Filled 2022-02-18: qty 50

## 2022-02-18 NOTE — Progress Notes (Signed)
Pt agitated this morning. Safety Order in and sitter at bedside.

## 2022-02-18 NOTE — Progress Notes (Signed)
PROGRESS NOTE    Terri Ayala  FYB:[id number] DOB: 02-20-1956 DOA: 02/15/2022 PCP: Lorrene Reid, PA-C   Brief Narrative:  66 year old with history of GAD, chronic depression, former alcohol use, HTN, chronic back pain brought to the ED for agitation.  Patient reported of being out of her medications and taking THC gummy and having hallucinations.  UDS was positive for benzodiazepine and THC.  UA was negative.  Lab work showed hypokalemia and slight metabolic acidosis.  Due to severe agitation, patient was started on phenobarb taper.  Psychiatry team was consulted.  CT of the head is negative   Assessment & Plan:  Principal Problem:   Acute metabolic encephalopathy   Acute toxic encephalopathy with agitation hallucination Extensive history of polysubstance abuse UDS positive for THC and benzos GAD/depression: Likely polypharmacy or withdrawal from polysubstance abuse.  No obvious evidence of active infection.  Currently on CIWA with IV Ativan.  Also on phenobarbital for agitation. Ammonia level normal TSH, folate, B12-normal.  No evidence of urinary retention Seen by psychiatry, defer to their evaluation due to agitation CT head-negative  Advised nursing staff to slowly discontinue restraints   Hypokalemia  Replete as necessary   Mild metabolic acidosis  bicarb improved on gentle IV fluids  Transaminitis  trend LFTs, Improving, unclear etiology.   Mild leukocytosis  no evidence of UTI, no respiratory symptoms.  Monitor  Severe anxiety disorder  resume home meds once med rec completed.  Consult psychiatry  Alcohol abuse  continue CIWA scale Ativan    DVT prophylaxis: Lovenox Code Status: Full code Family Communication:    Status is: Inpatient Mentation slowly improving but not yet at baseline.  Continue hospital stay   Subjective: Patient seen and at bedside.  Slightly agitated but she is alert awake oriented X.3.  Answering all the basic questions  appropriately.   Examination:  Constitutional: Not in acute distress Respiratory: Clear to auscultation bilaterally Cardiovascular: Normal sinus rhythm, no rubs Abdomen: Nontender nondistended good bowel sounds Musculoskeletal: No edema noted Skin: No rashes seen Neurologic: CN 2-12 grossly intact.  And nonfocal Psychiatric: Slightly agitated but alert awake oriented X3  Objective: Vitals:   02/18/22 0300 02/18/22 0400 02/18/22 0500 02/18/22 0600  BP:  (!) 169/90    Pulse: (!) 101 95 78 83  Resp: '16 18 20 16  '$ Temp: 99 F (37.2 C) 99 F (37.2 C)    TempSrc: Axillary Axillary    SpO2: 100% 100% 98% 100%  Weight:        Intake/Output Summary (Last 24 hours) at 02/18/2022 0740 Last data filed at 02/18/2022 0732 Gross per 24 hour  Intake 1989.83 ml  Output 1400 ml  Net 589.83 ml   Filed Weights   02/17/22 0306  Weight: 56.3 kg     Data Reviewed:   CBC: Recent Labs  Lab 02/15/22 1630 02/16/22 0100 02/16/22 0324 02/17/22 0242 02/18/22 0311  WBC 9.2 13.8* 12.9* 13.1* 9.2  NEUTROABS 7.0  --   --   --   --   HGB 13.7 12.2 12.2 11.7* 12.0  HCT 41.6 35.3* 35.6* 35.1* 37.2  MCV 95.4 91.9 93.0 95.1 98.9  PLT 191 220 217 203 527   Basic Metabolic Panel: Recent Labs  Lab 02/15/22 1630 02/16/22 0100 02/16/22 0324 02/16/22 1641 02/17/22 0706 02/18/22 0311  NA 139  --  141 136 138 134*  K 3.4*  --  2.8* 2.9* 3.7 3.1*  CL 108  --  108 109 108 104  CO2 16*  --  22 21* 21* 18*  GLUCOSE 97  --  110* 92 101* 78  BUN 13  --  12 9 6* 6*  CREATININE 0.89 0.78 0.74 0.73 0.61 0.65  CALCIUM 10.1  --  8.8* 8.6* 8.4* 8.1*  MG  --   --  1.7 1.9 1.8 1.7  PHOS  --   --  3.3  --  2.7  --    GFR: Estimated Creatinine Clearance: 54 mL/min (by C-G formula based on SCr of 0.65 mg/dL). Liver Function Tests: Recent Labs  Lab 02/15/22 1630 02/16/22 0324 02/17/22 0706 02/18/22 0311  AST 159* 100* 77* 61*  ALT 168* 125* 98* 87*  ALKPHOS 55 42 39 40  BILITOT 0.9 1.3* 0.8  1.0  PROT 7.7 6.6 6.0* 6.2*  ALBUMIN 4.3 3.6 3.5 3.4*   No results for input(s): "LIPASE", "AMYLASE" in the last 168 hours. Recent Labs  Lab 02/16/22 0324  AMMONIA 20   Coagulation Profile: No results for input(s): "INR", "PROTIME" in the last 168 hours. Cardiac Enzymes: No results for input(s): "CKTOTAL", "CKMB", "CKMBINDEX", "TROPONINI" in the last 168 hours. BNP (last 3 results) No results for input(s): "PROBNP" in the last 8760 hours. HbA1C: No results for input(s): "HGBA1C" in the last 72 hours. CBG: No results for input(s): "GLUCAP" in the last 168 hours. Lipid Profile: No results for input(s): "CHOL", "HDL", "LDLCALC", "TRIG", "CHOLHDL", "LDLDIRECT" in the last 72 hours. Thyroid Function Tests: Recent Labs    02/16/22 0824  TSH 3.018   Anemia Panel: Recent Labs    02/16/22 0824 02/17/22 0824  VITAMINB12 2,258*  --   FOLATE  --  13.4   Sepsis Labs: No results for input(s): "PROCALCITON", "LATICACIDVEN" in the last 168 hours.  Recent Results (from the past 240 hour(s))  MRSA Next Gen by PCR, Nasal     Status: None   Collection Time: 02/15/22 11:43 PM   Specimen: Nasal Mucosa; Nasal Swab  Result Value Ref Range Status   MRSA by PCR Next Gen NOT DETECTED NOT DETECTED Final    Comment: (NOTE) The GeneXpert MRSA Assay (FDA approved for NASAL specimens only), is one component of a comprehensive MRSA colonization surveillance program. It is not intended to diagnose MRSA infection nor to guide or monitor treatment for MRSA infections. Test performance is not FDA approved in patients less than 69 years old. Performed at [facility], [place] [address] [place], [place] [address]          Radiology Studies: CT HEAD WO CONTRAST (5MM)  Result Date: 02/17/2022 CLINICAL DATA:  Altered mental status EXAM: CT HEAD WITHOUT CONTRAST TECHNIQUE: Contiguous axial images were obtained from the base of the skull through the vertex without intravenous  contrast. RADIATION DOSE REDUCTION: This exam was performed according to the departmental dose-optimization program which includes automated exposure control, adjustment of the mA and/or kV according to patient size and/or use of iterative reconstruction technique. COMPARISON:  CT head 01/25/2022 FINDINGS: Brain: There is no acute intracranial hemorrhage, extra-axial fluid collection, or acute infarct Parenchymal volume is stable. The ventricles are stable in size. Gray-white differentiation is preserved. Small calcification in the left cerebellar hemisphere is unchanged. There is no mass lesion.  There is no mass effect or midline shift. Vascular: No hyperdense vessel or unexpected calcification. Skull: Normal. Negative for fracture or focal lesion. Sinuses/Orbits: The paranasal sinuses are essentially clear. Bilateral lens implants are in place. The globes and orbits are otherwise unremarkable. Other: None. IMPRESSION: No acute intracranial pathology.  Electronically Signed   By: Valetta Mole M.D.   On: 02/17/2022 14:42        Scheduled Meds:  buPROPion ER  100 mg Oral BID   busPIRone  15 mg Oral TID   carvedilol  6.25 mg Oral BID WC   Chlorhexidine Gluconate Cloth  6 each Topical Daily   enoxaparin (LOVENOX) injection  40 mg Subcutaneous D32I   folic acid  1 mg Oral Daily   multivitamin with minerals  1 tablet Oral Daily   PHENObarbital  65 mg Intravenous Q8H   Followed by   PHENObarbital  32.5 mg Intravenous Q8H   thiamine  100 mg Oral Daily   Or   thiamine  100 mg Intravenous Daily   Continuous Infusions:  sodium chloride Stopped (02/18/22 0637)   dexmedetomidine (PRECEDEX) IV infusion 0.7 mcg/kg/hr (02/18/22 0732)   potassium chloride       LOS: 3 days   Time spent= 35 mins    Tito Ausmus Arsenio Loader, MD [facility]  If 7PM-7AM, please contact night-coverage  02/18/2022, 7:40 AM

## 2022-02-18 NOTE — Progress Notes (Signed)
eLink Physician-Brief Progress Note Patient Name: [name] DOB: 08-22-1955 MRN: [id number]   Date of Service  02/18/2022  HPI/Events of Note  Notified of abnormal labs - K 3.1, Mg 1.7, crea 0.76.   eICU Interventions  Replete K and Mg.      Intervention Category Intermediate Interventions: Electrolyte abnormality - evaluation and management  Elsie Lincoln 02/18/2022, 4:32 AM

## 2022-02-18 NOTE — Progress Notes (Signed)
Assessed bilateral arms utilizing ultrasound. No visible veins appropriate for PIV, midline, or PICC. Unit RN notified.

## 2022-02-18 NOTE — Progress Notes (Signed)
Patient removed IV access. MD, Reesa Chew  contacted after IV Team Consult completed and unable to place PIV.

## 2022-02-19 ENCOUNTER — Other Ambulatory Visit: Payer: Self-pay

## 2022-02-19 DIAGNOSIS — G9341 Metabolic encephalopathy: Secondary | ICD-10-CM | POA: Diagnosis not present

## 2022-02-19 LAB — MAGNESIUM: Magnesium: 2 mg/dL (ref 1.7–2.4)

## 2022-02-19 LAB — CBC
HCT: 39.7 % (ref 36.0–46.0)
Hemoglobin: 13.3 g/dL (ref 12.0–15.0)
MCH: 31.9 pg (ref 26.0–34.0)
MCHC: 33.5 g/dL (ref 30.0–36.0)
MCV: 95.2 fL (ref 80.0–100.0)
Platelets: 157 10*3/uL (ref 150–400)
RBC: 4.17 MIL/uL (ref 3.87–5.11)
RDW: 14 % (ref 11.5–15.5)
WBC: 4.9 10*3/uL (ref 4.0–10.5)
nRBC: 0 % (ref 0.0–0.2)

## 2022-02-19 LAB — COMPREHENSIVE METABOLIC PANEL
ALT: 69 U/L — ABNORMAL HIGH (ref 0–44)
AST: 60 U/L — ABNORMAL HIGH (ref 15–41)
Albumin: 3.9 g/dL (ref 3.5–5.0)
Alkaline Phosphatase: 47 U/L (ref 38–126)
Anion gap: 11 (ref 5–15)
BUN: 5 mg/dL — ABNORMAL LOW (ref 8–23)
CO2: 19 mmol/L — ABNORMAL LOW (ref 22–32)
Calcium: 8.8 mg/dL — ABNORMAL LOW (ref 8.9–10.3)
Chloride: 107 mmol/L (ref 98–111)
Creatinine, Ser: 0.62 mg/dL (ref 0.44–1.00)
GFR, Estimated: >60 mL/min (ref >60)
Glucose, Bld: 93 mg/dL (ref 70–99)
Potassium: 4.2 mmol/L (ref 3.5–5.1)
Sodium: 137 mmol/L (ref 135–145)
Total Bilirubin: 1.5 mg/dL — ABNORMAL HIGH (ref 0.3–1.2)
Total Protein: 7.1 g/dL (ref 6.5–8.1)

## 2022-02-19 LAB — GLUCOSE, CAPILLARY
Glucose-Capillary: 86 mg/dL (ref 70–99)
Glucose-Capillary: 103 mg/dL — ABNORMAL HIGH (ref 70–99)
Glucose-Capillary: 84 mg/dL (ref 70–99)

## 2022-02-19 NOTE — Progress Notes (Signed)
PROGRESS NOTE    Terri Ayala  PPI:[id number] DOB: Oct 07, 1955 DOA: 02/15/2022 PCP: Lorrene Reid, PA-C   Brief Narrative:  66 year old with history of GAD, chronic depression, former alcohol use, HTN, chronic back pain brought to the ED for agitation.  Patient reported of being out of her medications and taking THC gummy and having hallucinations.  UDS was positive for benzodiazepine and THC.  UA was negative.  Lab work showed hypokalemia and slight metabolic acidosis.  Due to severe agitation, patient was started on phenobarb taper.  Psychiatry team was consulted.  CT of the head is negative.  Slowly mentation started improving   Assessment & Plan:  Principal Problem:   Acute metabolic encephalopathy   Acute toxic encephalopathy with agitation hallucination Extensive history of polysubstance abuse UDS positive for THC and benzos GAD/depression: Likely polypharmacy or withdrawal from polysubstance abuse.  No obvious evidence of active infection.  Completed phenobarbital course.  Slowly patient's mentation is improving. Ammonia level normal TSH, folate, B12-normal.  No evidence of urinary retention Seen by psychiatry, initially deferred evaluation as they were not able to interview the patient.  I will reconsult them as patient is slightly more interactive. CT head-negative    Hypokalemia  Patient refusing repeat lab work   Mild metabolic acidosis  Refusing repeat lab work  Transaminitis  trend LFTs, Improving, unclear etiology.  Refusing repeat lab work   Mild leukocytosis  no evidence of UTI, no respiratory symptoms.  Monitor  Severe anxiety disorder  Reconsult psychiatry  Alcohol abuse  continue CIWA scale Ativan    DVT prophylaxis: Lovenox Code Status: Full code Family Communication:    Status is: Inpatient Awaiting brain tissue today due to patient improved.  Psychiatry reconsulted  Subjective: Patient seen and examined at bedside, able to answer all the  basic question.  She still remains very anxious and agitated.  Examination: Constitutional: Not in acute distress Respiratory: Clear to auscultation bilaterally Cardiovascular: Normal sinus rhythm, no rubs Abdomen: Nontender nondistended good bowel sounds Musculoskeletal: No edema noted Skin: No rashes seen Neurologic: CN 2-12 grossly intact.  And nonfocal Psychiatric: Poor judgment and insight.  Alert awake oriented X 2-3.  Still very anxious Objective: Vitals:   02/18/22 1623 02/18/22 1933 02/19/22 0000 02/19/22 0333  BP: (!) 152/89 (!) 141/80 (!) 146/61 (!) 142/85  Pulse: 95 90 80 89  Resp: 18 20    Temp: 99 F (37.2 C) 98.6 F (37 C) 98.1 F (36.7 C) 98.4 F (36.9 C)  TempSrc: Oral Oral Oral Oral  SpO2: 100% 100% 100% 100%  Weight:        Intake/Output Summary (Last 24 hours) at 02/19/2022 1133 Last data filed at 02/19/2022 1001 Gross per 24 hour  Intake 209.29 ml  Output 300 ml  Net -90.71 ml   Filed Weights   02/17/22 0306  Weight: 56.3 kg     Data Reviewed:   CBC: Recent Labs  Lab 02/15/22 1630 02/16/22 0100 02/16/22 0324 02/17/22 0242 02/18/22 0311  WBC 9.2 13.8* 12.9* 13.1* 9.2  NEUTROABS 7.0  --   --   --   --   HGB 13.7 12.2 12.2 11.7* 12.0  HCT 41.6 35.3* 35.6* 35.1* 37.2  MCV 95.4 91.9 93.0 95.1 98.9  PLT 191 220 217 203 063   Basic Metabolic Panel: Recent Labs  Lab 02/15/22 1630 02/16/22 0100 02/16/22 0324 02/16/22 1641 02/17/22 0706 02/18/22 0311  NA 139  --  141 136 138 134*  K 3.4*  --  2.8* 2.9* 3.7 3.1*  CL 108  --  108 109 108 104  CO2 16*  --  22 21* 21* 18*  GLUCOSE 97  --  110* 92 101* 78  BUN 13  --  12 9 6* 6*  CREATININE 0.89 0.78 0.74 0.73 0.61 0.65  CALCIUM 10.1  --  8.8* 8.6* 8.4* 8.1*  MG  --   --  1.7 1.9 1.8 1.7  PHOS  --   --  3.3  --  2.7  --    GFR: Estimated Creatinine Clearance: 54 mL/min (by C-G formula based on SCr of 0.65 mg/dL). Liver Function Tests: Recent Labs  Lab 02/15/22 1630 02/16/22 0324  02/17/22 0706 02/18/22 0311  AST 159* 100* 77* 61*  ALT 168* 125* 98* 87*  ALKPHOS 55 42 39 40  BILITOT 0.9 1.3* 0.8 1.0  PROT 7.7 6.6 6.0* 6.2*  ALBUMIN 4.3 3.6 3.5 3.4*   No results for input(s): "LIPASE", "AMYLASE" in the last 168 hours. Recent Labs  Lab 02/16/22 0324  AMMONIA 20   Coagulation Profile: No results for input(s): "INR", "PROTIME" in the last 168 hours. Cardiac Enzymes: No results for input(s): "CKTOTAL", "CKMB", "CKMBINDEX", "TROPONINI" in the last 168 hours. BNP (last 3 results) No results for input(s): "PROBNP" in the last 8760 hours. HbA1C: No results for input(s): "HGBA1C" in the last 72 hours. CBG: Recent Labs  Lab 02/18/22 0808 02/18/22 1206 02/18/22 1819 02/18/22 2358 02/19/22 0721  GLUCAP 80 77 78 81 86   Lipid Profile: No results for input(s): "CHOL", "HDL", "LDLCALC", "TRIG", "CHOLHDL", "LDLDIRECT" in the last 72 hours. Thyroid Function Tests: No results for input(s): "TSH", "T4TOTAL", "FREET4", "T3FREE", "THYROIDAB" in the last 72 hours.  Anemia Panel: Recent Labs    02/17/22 0824  FOLATE 13.4   Sepsis Labs: No results for input(s): "PROCALCITON", "LATICACIDVEN" in the last 168 hours.  Recent Results (from the past 240 hour(s))  MRSA Next Gen by PCR, Nasal     Status: None   Collection Time: 02/15/22 11:43 PM   Specimen: Nasal Mucosa; Nasal Swab  Result Value Ref Range Status   MRSA by PCR Next Gen NOT DETECTED NOT DETECTED Final    Comment: (NOTE) The GeneXpert MRSA Assay (FDA approved for NASAL specimens only), is one component of a comprehensive MRSA colonization surveillance program. It is not intended to diagnose MRSA infection nor to guide or monitor treatment for MRSA infections. Test performance is not FDA approved in patients less than 23 years old. Performed at [facility], [place] [address] [place], [place] [address]          Radiology Studies: CT HEAD WO CONTRAST (5MM)  Result Date:  02/17/2022 CLINICAL DATA:  Altered mental status EXAM: CT HEAD WITHOUT CONTRAST TECHNIQUE: Contiguous axial images were obtained from the base of the skull through the vertex without intravenous contrast. RADIATION DOSE REDUCTION: This exam was performed according to the departmental dose-optimization program which includes automated exposure control, adjustment of the mA and/or kV according to patient size and/or use of iterative reconstruction technique. COMPARISON:  CT head 01/25/2022 FINDINGS: Brain: There is no acute intracranial hemorrhage, extra-axial fluid collection, or acute infarct Parenchymal volume is stable. The ventricles are stable in size. Gray-white differentiation is preserved. Small calcification in the left cerebellar hemisphere is unchanged. There is no mass lesion.  There is no mass effect or midline shift. Vascular: No hyperdense vessel or unexpected calcification. Skull: Normal. Negative for fracture or focal lesion. Sinuses/Orbits: The  paranasal sinuses are essentially clear. Bilateral lens implants are in place. The globes and orbits are otherwise unremarkable. Other: None. IMPRESSION: No acute intracranial pathology. Electronically Signed   By: Valetta Mole M.D.   On: 02/17/2022 14:42        Scheduled Meds:  buPROPion ER  100 mg Oral BID   busPIRone  15 mg Oral TID   carvedilol  6.25 mg Oral BID WC   enoxaparin (LOVENOX) injection  40 mg Subcutaneous A12I   folic acid  1 mg Oral Daily   multivitamin with minerals  1 tablet Oral Daily   phenobarbital  32.4 mg Oral Q8H   thiamine  100 mg Oral Daily   Or   thiamine  100 mg Intravenous Daily   Continuous Infusions:  dexmedetomidine (PRECEDEX) IV infusion Stopped (02/18/22 0910)     LOS: 4 days   Time spent= 35 mins    Rilyn Upshaw Arsenio Loader, MD [facility]  If 7PM-7AM, please contact night-coverage  02/19/2022, 11:33 AM

## 2022-02-19 NOTE — Consult Note (Signed)
[place] Psychiatry Consult   Reason for Consult:  agitation Referring Physician:  Dr. Reesa Chew Patient Identification: Terri Ayala MRN:  [id number] Principal Diagnosis: Acute metabolic encephalopathy Diagnosis:  Principal Problem:   Acute metabolic encephalopathy   Terri Ayala is a 66 y.o. female with h/o GAD, chronic depression, former alcohol use, HTN, chronic back pain brought to the ED for agitation.  Patient reported of being out of her medications and taking THC gummy and having hallucinations.  UDS was positive for benzodiazepine and THC.  UA was negative.  Lab work showed hypokalemia and slight metabolic acidosis.  Due to severe agitation, patient was started on phenobarb taper.    Psychiatry team was consulted, although unable to assess the patient yesterday.  I attempted to assess the patient again twice today, patient remained sleeping both times. I attempted to wake her up, she would wake up briefly, although unable to engage in conversation both times and would fall back asleep briefly.   Per sitter, patient was mostly asleep today, she would go to the bathroom a couple of times, but would not participate in conversation.  Will try to see again tomorrow.   Continue current medications at this time.   Larita Fife, MD 02/19/2022 3:58 PM

## 2022-02-20 ENCOUNTER — Other Ambulatory Visit: Payer: Self-pay

## 2022-02-20 DIAGNOSIS — G9341 Metabolic encephalopathy: Secondary | ICD-10-CM | POA: Diagnosis not present

## 2022-02-20 LAB — CBC
HCT: 38 % (ref 36.0–46.0)
Hemoglobin: 12.8 g/dL (ref 12.0–15.0)
MCH: 31.3 pg (ref 26.0–34.0)
MCHC: 33.7 g/dL (ref 30.0–36.0)
MCV: 92.9 fL (ref 80.0–100.0)
Platelets: 215 10*3/uL (ref 150–400)
RBC: 4.09 MIL/uL (ref 3.87–5.11)
RDW: 13.9 % (ref 11.5–15.5)
WBC: 5.7 10*3/uL (ref 4.0–10.5)
nRBC: 0 % (ref 0.0–0.2)

## 2022-02-20 LAB — COMPREHENSIVE METABOLIC PANEL
ALT: 58 U/L — ABNORMAL HIGH (ref 0–44)
AST: 36 U/L (ref 15–41)
Albumin: 4 g/dL (ref 3.5–5.0)
Alkaline Phosphatase: 45 U/L (ref 38–126)
Anion gap: 12 (ref 5–15)
BUN: 6 mg/dL — ABNORMAL LOW (ref 8–23)
CO2: 20 mmol/L — ABNORMAL LOW (ref 22–32)
Calcium: 9.1 mg/dL (ref 8.9–10.3)
Chloride: 105 mmol/L (ref 98–111)
Creatinine, Ser: 0.69 mg/dL (ref 0.44–1.00)
GFR, Estimated: >60 mL/min (ref >60)
Glucose, Bld: 94 mg/dL (ref 70–99)
Potassium: 3.4 mmol/L — ABNORMAL LOW (ref 3.5–5.1)
Sodium: 137 mmol/L (ref 135–145)
Total Bilirubin: 0.5 mg/dL (ref 0.3–1.2)
Total Protein: 7 g/dL (ref 6.5–8.1)

## 2022-02-20 LAB — GLUCOSE, CAPILLARY
Glucose-Capillary: 107 mg/dL — ABNORMAL HIGH (ref 70–99)
Glucose-Capillary: 85 mg/dL (ref 70–99)
Glucose-Capillary: 91 mg/dL (ref 70–99)
Glucose-Capillary: 96 mg/dL (ref 70–99)

## 2022-02-20 LAB — MAGNESIUM: Magnesium: 1.7 mg/dL (ref 1.7–2.4)

## 2022-02-20 MED ORDER — POTASSIUM CHLORIDE CRYS ER 20 MEQ PO TBCR
40.0000 meq | EXTENDED_RELEASE_TABLET | Freq: Once | ORAL | Status: AC
  Administered 2022-02-20: 40 meq via ORAL
  Filled 2022-02-20: qty 2

## 2022-02-20 MED ORDER — THIAMINE HCL 100 MG/ML IJ SOLN
500.0000 mg | INTRAMUSCULAR | Status: AC
  Administered 2022-02-21 – 2022-02-22 (×2): 500 mg via INTRAMUSCULAR
  Filled 2022-02-20 (×2): qty 6

## 2022-02-20 MED ORDER — SUMATRIPTAN SUCCINATE 50 MG PO TABS
50.0000 mg | ORAL_TABLET | Freq: Once | ORAL | Status: AC
  Administered 2022-02-20: 50 mg via ORAL
  Filled 2022-02-20: qty 1

## 2022-02-20 MED ORDER — THIAMINE HCL 100 MG/ML IJ SOLN
500.0000 mg | INTRAVENOUS | Status: DC
  Administered 2022-02-20: 500 mg via INTRAVENOUS
  Filled 2022-02-20: qty 5

## 2022-02-20 MED ORDER — LORAZEPAM 2 MG/ML IJ SOLN
2.0000 mg | Freq: Once | INTRAMUSCULAR | Status: AC
  Administered 2022-02-21: 2 mg via INTRAVENOUS
  Filled 2022-02-20: qty 1

## 2022-02-20 NOTE — Progress Notes (Signed)
PROGRESS NOTE    Terri Ayala  SAY:[id number] DOB: August 22, 1955 DOA: 02/15/2022 PCP: Lorrene Reid, PA-C   Brief Narrative:  66 year old with history of GAD, chronic depression, former alcohol use, HTN, chronic back pain brought to the ED for agitation.  Patient reported of being out of her medications and taking THC gummy and having hallucinations.  UDS was positive for benzodiazepine and THC.  UA was negative.  Lab work showed hypokalemia and slight metabolic acidosis.  Due to severe agitation, patient was started on phenobarb taper.  Psychiatry team was consulted.  CT of the head is negative.  Slowly mentation started improving   Assessment & Plan:  Principal Problem:   Acute metabolic encephalopathy   Acute toxic encephalopathy with agitation hallucination Extensive history of polysubstance abuse UDS positive for THC and benzos GAD/depression: Likely polypharmacy or withdrawal from polysubstance abuse.  No obvious evidence of active infection.  Completed phenobarbital course.  Slowly patient's mentation is improving. Ammonia level normal TSH, folate, B12-normal.  No evidence of urinary retention Seen by psychiatry, initially deferred evaluation as they were not able to interview the patient.  I will reconsult them as patient is slightly more interactive. CT head-negative  Hypokalemia  Patient refusing repeat lab work   Mild metabolic acidosis  Refusing repeat lab work  Transaminitis  trend LFTs, Improving, unclear etiology.  Refusing repeat lab work   Mild leukocytosis  no evidence of UTI, no respiratory symptoms.  Monitor  Severe anxiety disorder  Reconsult psychiatry  Alcohol abuse  continue CIWA scale Ativan    DVT prophylaxis: Lovenox Code Status: Full code Family Communication:    Status is: Inpatient Psychiatry reconsulted pending eval.   Subjective: Resting no complaints.   Examination: Constitutional: Not in acute distress Respiratory: Clear to  auscultation bilaterally Cardiovascular: Normal sinus rhythm, no rubs Abdomen: Nontender nondistended good bowel sounds Musculoskeletal: No edema noted Skin: No rashes seen Neurologic: CN 2-12 grossly intact.  And nonfocal Psychiatric: Poorjudgment and insight. Alert and oriented x 2  Objective: Vitals:   02/19/22 0333 02/19/22 1414 02/19/22 2031 02/20/22 0902  BP: (!) 142/85 (!) 146/82 118/71 135/73  Pulse: 89 80 87 83  Resp:  18 18   Temp: 98.4 F (36.9 C) 98.1 F (36.7 C) 98.2 F (36.8 C)   TempSrc: Oral Oral Axillary   SpO2: 100% 100% 100%   Weight:        Intake/Output Summary (Last 24 hours) at 02/20/2022 1022 Last data filed at 02/20/2022 0803 Gross per 24 hour  Intake 290 ml  Output 0 ml  Net 290 ml   Filed Weights   02/17/22 0306  Weight: 56.3 kg     Data Reviewed:   CBC: Recent Labs  Lab 02/15/22 1630 02/16/22 0100 02/16/22 0324 02/17/22 0242 02/18/22 0311 02/19/22 1216 02/20/22 0755  WBC 9.2   < > 12.9* 13.1* 9.2 4.9 5.7  NEUTROABS 7.0  --   --   --   --   --   --   HGB 13.7   < > 12.2 11.7* 12.0 13.3 12.8  HCT 41.6   < > 35.6* 35.1* 37.2 39.7 38.0  MCV 95.4   < > 93.0 95.1 98.9 95.2 92.9  PLT 191   < > 217 203 173 157 215   < > = values in this interval not displayed.   Basic Metabolic Panel: Recent Labs  Lab 02/16/22 0324 02/16/22 1641 02/17/22 0706 02/18/22 0311 02/19/22 1216 02/20/22 0755  NA 141 136 138  134* 137 137  K 2.8* 2.9* 3.7 3.1* 4.2 3.4*  CL 108 109 108 104 107 105  CO2 22 21* 21* 18* 19* 20*  GLUCOSE 110* 92 101* 78 93 94  BUN 12 9 6* 6* 5* 6*  CREATININE 0.74 0.73 0.61 0.65 0.62 0.69  CALCIUM 8.8* 8.6* 8.4* 8.1* 8.8* 9.1  MG 1.7 1.9 1.8 1.7 2.0 1.7  PHOS 3.3  --  2.7  --   --   --    GFR: Estimated Creatinine Clearance: 54 mL/min (by C-G formula based on SCr of 0.69 mg/dL). Liver Function Tests: Recent Labs  Lab 02/16/22 0324 02/17/22 0706 02/18/22 0311 02/19/22 1216 02/20/22 0755  AST 100* 77* 61* 60* 36   ALT 125* 98* 87* 69* 58*  ALKPHOS 42 39 40 47 45  BILITOT 1.3* 0.8 1.0 1.5* 0.5  PROT 6.6 6.0* 6.2* 7.1 7.0  ALBUMIN 3.6 3.5 3.4* 3.9 4.0   No results for input(s): "LIPASE", "AMYLASE" in the last 168 hours. Recent Labs  Lab 02/16/22 0324  AMMONIA 20   Coagulation Profile: No results for input(s): "INR", "PROTIME" in the last 168 hours. Cardiac Enzymes: No results for input(s): "CKTOTAL", "CKMB", "CKMBINDEX", "TROPONINI" in the last 168 hours. BNP (last 3 results) No results for input(s): "PROBNP" in the last 8760 hours. HbA1C: No results for input(s): "HGBA1C" in the last 72 hours. CBG: Recent Labs  Lab 02/18/22 2358 02/19/22 0721 02/19/22 1241 02/19/22 1728 02/19/22 2359  GLUCAP 81 86 103* 84 107*   Lipid Profile: No results for input(s): "CHOL", "HDL", "LDLCALC", "TRIG", "CHOLHDL", "LDLDIRECT" in the last 72 hours. Thyroid Function Tests: No results for input(s): "TSH", "T4TOTAL", "FREET4", "T3FREE", "THYROIDAB" in the last 72 hours.  Anemia Panel: No results for input(s): "VITAMINB12", "FOLATE", "FERRITIN", "TIBC", "IRON", "RETICCTPCT" in the last 72 hours.  Sepsis Labs: No results for input(s): "PROCALCITON", "LATICACIDVEN" in the last 168 hours.  Recent Results (from the past 240 hour(s))  MRSA Next Gen by PCR, Nasal     Status: None   Collection Time: 02/15/22 11:43 PM   Specimen: Nasal Mucosa; Nasal Swab  Result Value Ref Range Status   MRSA by PCR Next Gen NOT DETECTED NOT DETECTED Final    Comment: (NOTE) The GeneXpert MRSA Assay (FDA approved for NASAL specimens only), is one component of a comprehensive MRSA colonization surveillance program. It is not intended to diagnose MRSA infection nor to guide or monitor treatment for MRSA infections. Test performance is not FDA approved in patients less than 65 years old. Performed at [facility], [place] [address], [place], [place] [address]          Radiology Studies: No results  found.      Scheduled Meds:  buPROPion ER  100 mg Oral BID   busPIRone  15 mg Oral TID   carvedilol  6.25 mg Oral BID WC   enoxaparin (LOVENOX) injection  40 mg Subcutaneous R42H   folic acid  1 mg Oral Daily   multivitamin with minerals  1 tablet Oral Daily   phenobarbital  32.4 mg Oral Q8H   thiamine  100 mg Oral Daily   Or   thiamine  100 mg Intravenous Daily   Continuous Infusions:  dexmedetomidine (PRECEDEX) IV infusion Stopped (02/18/22 0910)     LOS: 5 days   Time spent= 35 mins    Lawayne Hartig Arsenio Loader, MD [facility]  If 7PM-7AM, please contact night-coverage  02/20/2022, 10:22 AM

## 2022-02-20 NOTE — Consult Note (Signed)
[place] Psychiatry Consult   Reason for Consult:  agitation Referring Physician:  Dr. Reesa Chew Patient Identification: Terri Ayala MRN:  [id number] Principal Diagnosis: Acute metabolic encephalopathy Diagnosis:  Principal Problem:   Acute metabolic encephalopathy   Terri Ayala is a 66 y.o. female with h/o GAD, chronic depression, former alcohol use, HTN, chronic back pain brought to the ED for agitation.  Patient reported of being out of her medications and taking THC gummy and having hallucinations.  UDS was positive for benzodiazepine and THC.  UA was negative.  Lab work showed hypokalemia and slight metabolic acidosis.  Due to severe agitation, patient was started on phenobarb taper.    Psychiatry team was consulted, although unable to assess the patient for the last three days due to drowsiness.  I attempted to assess the patient again today, patient remained sleeping. I attempted to wake her up, she would open eyes briefly, although unable to engage in conversation and would fall back asleep briefly.   Per sitter, patient was mostly asleep today, even when awake - appears drowsy and not participating in any conversations.  Continue current medications at this time.  Will try to see again when patient is alert.  Larita Fife, MD 02/20/2022 1:02 PM

## 2022-02-21 DIAGNOSIS — R410 Disorientation, unspecified: Secondary | ICD-10-CM | POA: Diagnosis not present

## 2022-02-21 DIAGNOSIS — R443 Hallucinations, unspecified: Secondary | ICD-10-CM | POA: Diagnosis not present

## 2022-02-21 DIAGNOSIS — G9341 Metabolic encephalopathy: Secondary | ICD-10-CM | POA: Diagnosis not present

## 2022-02-21 LAB — COMPREHENSIVE METABOLIC PANEL
ALT: 47 U/L — ABNORMAL HIGH (ref 0–44)
AST: 31 U/L (ref 15–41)
Albumin: 4.3 g/dL (ref 3.5–5.0)
Alkaline Phosphatase: 53 U/L (ref 38–126)
Anion gap: 12 (ref 5–15)
BUN: 9 mg/dL (ref 8–23)
CO2: 25 mmol/L (ref 22–32)
Calcium: 10 mg/dL (ref 8.9–10.3)
Chloride: 103 mmol/L (ref 98–111)
Creatinine, Ser: 0.79 mg/dL (ref 0.44–1.00)
GFR, Estimated: >60 mL/min (ref >60)
Glucose, Bld: 91 mg/dL (ref 70–99)
Potassium: 3.9 mmol/L (ref 3.5–5.1)
Sodium: 140 mmol/L (ref 135–145)
Total Bilirubin: 0.7 mg/dL (ref 0.3–1.2)
Total Protein: 7.6 g/dL (ref 6.5–8.1)

## 2022-02-21 LAB — CBC
HCT: 40.1 % (ref 36.0–46.0)
Hemoglobin: 13.6 g/dL (ref 12.0–15.0)
MCH: 31.5 pg (ref 26.0–34.0)
MCHC: 33.9 g/dL (ref 30.0–36.0)
MCV: 92.8 fL (ref 80.0–100.0)
Platelets: 230 10*3/uL (ref 150–400)
RBC: 4.32 MIL/uL (ref 3.87–5.11)
RDW: 14.1 % (ref 11.5–15.5)
WBC: 5.5 10*3/uL (ref 4.0–10.5)
nRBC: 0 % (ref 0.0–0.2)

## 2022-02-21 LAB — GLUCOSE, CAPILLARY
Glucose-Capillary: 83 mg/dL (ref 70–99)
Glucose-Capillary: 118 mg/dL — ABNORMAL HIGH (ref 70–99)

## 2022-02-21 LAB — MAGNESIUM: Magnesium: 1.8 mg/dL (ref 1.7–2.4)

## 2022-02-21 MED ORDER — LORAZEPAM 2 MG/ML IJ SOLN
2.0000 mg | Freq: Four times a day (QID) | INTRAMUSCULAR | Status: DC
  Administered 2022-02-21 – 2022-02-22 (×4): 2 mg via INTRAVENOUS
  Filled 2022-02-21 (×4): qty 1

## 2022-02-21 NOTE — Consult Note (Signed)
[facility] Consult   Reason for Consult: Encephalopathy/confusion/hallucinations Referring Physician: Dr. Reesa Chew Patient Identification: Terri Ayala MRN:  [id number] Principal Diagnosis: Acute metabolic encephalopathy Diagnosis:  Principal Problem:   Acute metabolic encephalopathy  Total Time spent with patient: 45 minutes  Subjective:   Terri Ayala is a 66 y.o. female patient admitted with hallucinations.  Patient seen and reassessed today.  On initial evaluation patient presented with confusion, difficulty to arouse, difficulty remaining awake to answer orientation questions.  Patient was nonverbal throughout psychiatric evaluation.  When attempting to assess and compare yesterday's progression, patient was unable to participate and remained nonverbal.  However, when primary team assess this patient, she is interactive, engages well and participate albeit minimal in daily evaluation/reassessment.  Suspect some component of catatonia, due to patient's change in her behavior, responsiveness, and psychiatric presentation catatonia screening was performed in which patient rated at 10 on catatonia Screening instrument. This improved after ativan 2 mg.  Patient scored 4 on catatonia screening.  Psychiatry was initially consulted for underlying psychiatric condition.  It was determined patient to be exhibiting negative symptoms of schizophrenia, such as withdrawn, blunted affect, mutism, confusion.  She was subsequently treated with an Ativan challenge, with a positive response to lorazepam on 12/25.  On reassessment patient showed marked improvement in reduction in her psychiatric symptoms.  Patient has never been diagnosed with schizophrenia, however while she is not currently under the influence of illicit substances she is showing negative symptoms  (withdrawn, blunted affect) at this time that warrant further evaluation.  After reassessment she is alert and oriented x 3, able to  verbally communicate her thoughts, she answers all questions appropriately.  She describes her mood as irritable "like I am withdrawing".  Furthermore she is able to sit up in bed make eye contact and engage with this provider.  She is able to speak in complete sentences, and some of the questions and or answers she provides is appropriate.  She does not appear to be responding to internal stimuli, external stimuli, and or exhibiting delusional thought disorder.  She denies any current suicidal ideations and or homicidal ideations.  She does admit to some depression, however adamantly denies suicidality.  She does admit to using THC Gummies and smoking THC.  She denies alcohol use at this time, stating she quit in May 2023.  HPI: 66 year old with history of GAD, chronic depression, former alcohol use, HTN, chronic back pain brought to the ED for agitation.  Patient reported of being out of her medications and taking THC gummy and having hallucinations.  UDS was positive for benzodiazepine and THC.  UA was negative.  Lab work showed hypokalemia and slight metabolic acidosis.  Due to severe agitation, patient was started on phenobarb taper.  Psychiatry team was consulted.  CT of the head is negative.  Slowly mentation started improving.  Patient has been seen by psychiatry who recommended Ativan and thiamine.    Past Psychiatric History: Alcohol use disorder severe, generalized anxiety.  Currently receiving no outpatient psychiatric services.  [facility], chemical dependency intensive outpatient program.  Last appointment was October 2023.  Patient has most recent history of inpatient psychiatric hospitalization at [facility] in May 2023 for alcohol dependence and major depression disorder.  She was discharged on buspirone, duloxetine, hydroxyzine, trazodone.  She denies any history of recent suicide attempts.  Risk to Self:  Denies Risk to Others:   Denies Prior Inpatient Therapy:  Most recent [facility] May 2023 Prior Outpatient Therapy:  :[facility]  Past Medical History:  Past Medical History:  Diagnosis Date   Alcohol withdrawal ([place]) 11/2017; 01/04/2018   Alcoholism ([place])    Anxiety    Arthritis    "hands" (01/04/2018)   ASYMPTOMATIC POSTMENOPAUSAL STATUS 08/17/2009   Chronic lower back pain    Depression    DYSPHAGIA 06/19/2007   Fatty liver, alcoholic    GERD (gastroesophageal reflux disease)    GOITER, MULTINODULAR 08/17/2009   Hyperlipidemia    Hypertension    Migraines    "not sure what triggers them; I'll have 1 q couple weeks or more; come 2 days in a row when they come" (01/04/2018)   Mitral valve prolapse    OSTEOPOROSIS 08/17/2009   PONV (postoperative nausea and vomiting)    Supraventricular tachycardia     Past Surgical History:  Procedure Laterality Date   BREAST BIOPSY Right    "benign"   CATARACT EXTRACTION W/ INTRAOCULAR LENS  IMPLANT, BILATERAL     CERVICAL CONE BIOPSY  2000s   CERVIX LESION DESTRUCTION  1991   "dysplasia; lesions"   CHOLECYSTECTOMY N/A 03/14/2013   Procedure: LAPAROSCOPIC CHOLECYSTECTOMY WITH ATTEMPTED INTRAOPERATIVE CHOLANGIOGRAM;  Surgeon: Harl Bowie, MD;  Location: [place];  Service: General;  Laterality: N/A;   HARDWARE REMOVAL Left 07/22/2021   Procedure: HARDWARE REMOVAL;  Surgeon: Altamese El Rio, MD;  Location: [place];  Service: Orthopedics;  Laterality: Left;   laporoscopic abdominal surgery     "for endometriosis"   LEFT HEART CATH AND CORONARY ANGIOGRAPHY N/A 05/07/2018   Procedure: LEFT HEART CATH AND CORONARY ANGIOGRAPHY;  Surgeon: Belva Crome, MD;  Location: [facility];  Service: Cardiovascular;  Laterality: N/A;   ORIF ANKLE FRACTURE Left 02/11/2021   Procedure: OPEN REDUCTION INTERNAL FIXATION (ORIF) ANKLE FRACTURE;  Surgeon: Altamese Paraje, MD;  Location: [place];  Service: Orthopedics;  Laterality: Left;   TONSILLECTOMY     Family  History:  Family History  Problem Relation Age of Onset   Colon cancer Father        Deceased, 22   Osteoporosis Mother        Living, 36   Healthy Brother    Healthy Brother    Healthy Son    Healthy Son    Thyroid disease Neg Hx    Goiter Neg Hx    Family Psychiatric  History: Substance use history Social History:  Social History   Substance and Sexual Activity  Alcohol Use Not Currently   Alcohol/week: 28.0 standard drinks of alcohol   Types: 28 Glasses of wine per week   Comment: I had Detox on 07/13/21 and no alcohol since then     Social History   Substance and Sexual Activity  Drug Use Never    Social History   Socioeconomic History   Marital status: Single    Spouse name: Not on file   Number of children: Not on file   Years of education: Not on file   Highest education level: Not on file  Occupational History   Occupation: Brewing technologist - retired    Fish farm manager: [employer]  Tobacco Use   Smoking status: Former    Packs/day: 1.00    Years: 7.00    Total pack years: 7.00    Types: Cigarettes    Quit date: 02/28/1974    Years since quitting: 48.0   Smokeless tobacco: Never   Tobacco comments:  01/04/2018 "smoked when I was a teenager"  Vaping Use   Vaping Use: Never used  Substance and Sexual Activity   Alcohol use: Not Currently    Alcohol/week: 28.0 standard drinks of alcohol    Types: 28 Glasses of wine per week    Comment: I had Detox on 07/13/21 and no alcohol since then   Drug use: Never   Sexual activity: Not Currently  Other Topics Concern   Not on file  Social History Narrative   Lives alone.  She has a BS biology.   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   Social Determinants of Health   Financial Resource Strain: Not on file  Food Insecurity: Not on file  Transportation Needs: Not on file  Physical Activity: Not on file  Stress: Not on file  Social Connections: Not on file   Additional Social  History:    Allergies:   Allergies  Allergen Reactions   Doxycycline Swelling    Mouth swelling and sores  Other Reaction(s): Not available   Codeine Itching    Other Reaction(s): Not available   Sulfa Antibiotics     Possibly??   Tetracycline Hcl Swelling    Mouth swelling and sores    Labs:  Results for orders placed or performed during the hospital encounter of 02/15/22 (from the past 48 hour(s))  Comprehensive metabolic panel     Status: Abnormal   Collection Time: 02/19/22 12:16 PM  Result Value Ref Range   Sodium 137 135 - 145 mmol/L   Potassium 4.2 3.5 - 5.1 mmol/L    Comment: HEMOLYSIS AT THIS LEVEL MAY AFFECT RESULT   Chloride 107 98 - 111 mmol/L   CO2 19 (L) 22 - 32 mmol/L   Glucose, Bld 93 70 - 99 mg/dL    Comment: Glucose reference range applies only to samples taken after fasting for at least 8 hours.   BUN 5 (L) 8 - 23 mg/dL   Creatinine, Ser 0.62 0.44 - 1.00 mg/dL   Calcium 8.8 (L) 8.9 - 10.3 mg/dL   Total Protein 7.1 6.5 - 8.1 g/dL   Albumin 3.9 3.5 - 5.0 g/dL   AST 60 (H) 15 - 41 U/L    Comment: HEMOLYSIS AT THIS LEVEL MAY AFFECT RESULT   ALT 69 (H) 0 - 44 U/L    Comment: HEMOLYSIS AT THIS LEVEL MAY AFFECT RESULT   Alkaline Phosphatase 47 38 - 126 U/L   Total Bilirubin 1.5 (H) 0.3 - 1.2 mg/dL    Comment: HEMOLYSIS AT THIS LEVEL MAY AFFECT RESULT   GFR, Estimated >60 >60 mL/min    Comment: (NOTE) Calculated using the CKD-EPI Creatinine Equation (2021)    Anion gap 11 5 - 15    Comment: Performed at [facility], [place] [address] [place], [place] [address]  CBC     Status: None   Collection Time: 02/19/22 12:16 PM  Result Value Ref Range   WBC 4.9 4.0 - 10.5 K/uL   RBC 4.17 3.87 - 5.11 MIL/uL   Hemoglobin 13.3 12.0 - 15.0 g/dL   HCT 39.7 36.0 - 46.0 %   MCV 95.2 80.0 - 100.0 fL   MCH 31.9 26.0 - 34.0 pg   MCHC 33.5 30.0 - 36.0 g/dL   RDW 14.0 11.5 - 15.5 %   Platelets 157 150 - 400 K/uL   nRBC 0.0 0.0 - 0.2 %    Comment:  Performed at [facility],  [place] [address] [place], [place] [address]  Magnesium     Status: None   Collection Time: 02/19/22 12:16 PM  Result Value Ref Range   Magnesium 2.0 1.7 - 2.4 mg/dL    Comment: Performed at [facility], [place] [address] [place], [place] [address]  Glucose, capillary     Status: Abnormal   Collection Time: 02/19/22 12:41 PM  Result Value Ref Range   Glucose-Capillary 103 (H) 70 - 99 mg/dL    Comment: Glucose reference range applies only to samples taken after fasting for at least 8 hours.  Glucose, capillary     Status: None   Collection Time: 02/19/22  5:28 PM  Result Value Ref Range   Glucose-Capillary 84 70 - 99 mg/dL    Comment: Glucose reference range applies only to samples taken after fasting for at least 8 hours.  Glucose, capillary     Status: Abnormal   Collection Time: 02/19/22 11:59 PM  Result Value Ref Range   Glucose-Capillary 107 (H) 70 - 99 mg/dL    Comment: Glucose reference range applies only to samples taken after fasting for at least 8 hours.  Comprehensive metabolic panel     Status: Abnormal   Collection Time: 02/20/22  7:55 AM  Result Value Ref Range   Sodium 137 135 - 145 mmol/L   Potassium 3.4 (L) 3.5 - 5.1 mmol/L   Chloride 105 98 - 111 mmol/L   CO2 20 (L) 22 - 32 mmol/L   Glucose, Bld 94 70 - 99 mg/dL    Comment: Glucose reference range applies only to samples taken after fasting for at least 8 hours.   BUN 6 (L) 8 - 23 mg/dL   Creatinine, Ser 0.69 0.44 - 1.00 mg/dL   Calcium 9.1 8.9 - 10.3 mg/dL   Total Protein 7.0 6.5 - 8.1 g/dL   Albumin 4.0 3.5 - 5.0 g/dL   AST 36 15 - 41 U/L   ALT 58 (H) 0 - 44 U/L   Alkaline Phosphatase 45 38 - 126 U/L   Total Bilirubin 0.5 0.3 - 1.2 mg/dL   GFR, Estimated >60 >60 mL/min    Comment: (NOTE) Calculated using the CKD-EPI Creatinine Equation (2021)    Anion gap 12 5 - 15    Comment: Performed at [facility], [place] [address]  [place], [place] [address]  CBC     Status: None   Collection Time: 02/20/22  7:55 AM  Result Value Ref Range   WBC 5.7 4.0 - 10.5 K/uL   RBC 4.09 3.87 - 5.11 MIL/uL   Hemoglobin 12.8 12.0 - 15.0 g/dL   HCT 38.0 36.0 - 46.0 %   MCV 92.9 80.0 - 100.0 fL   MCH 31.3 26.0 - 34.0 pg   MCHC 33.7 30.0 - 36.0 g/dL   RDW 13.9 11.5 - 15.5 %   Platelets 215 150 - 400 K/uL   nRBC 0.0 0.0 - 0.2 %    Comment: Performed at [facility], [place] [address], [place], [place] [address]  Magnesium     Status: None   Collection Time: 02/20/22  7:55 AM  Result Value Ref Range   Magnesium 1.7 1.7 - 2.4 mg/dL    Comment: Performed at [facility], [place] [address] [place],  [address]  Glucose, capillary     Status: None   Collection Time: 02/20/22  1:01 PM  Result Value Ref Range   Glucose-Capillary 85 70 -  99 mg/dL    Comment: Glucose reference range applies only to samples taken after fasting for at least 8 hours.  Glucose, capillary     Status: None   Collection Time: 02/20/22  5:32 PM  Result Value Ref Range   Glucose-Capillary 91 70 - 99 mg/dL    Comment: Glucose reference range applies only to samples taken after fasting for at least 8 hours.  Glucose, capillary     Status: None   Collection Time: 02/20/22 11:54 PM  Result Value Ref Range   Glucose-Capillary 96 70 - 99 mg/dL    Comment: Glucose reference range applies only to samples taken after fasting for at least 8 hours.    Current Facility-Administered Medications  Medication Dose Route Frequency Provider Last Rate Last Admin   acetaminophen (TYLENOL) tablet 650 mg  650 mg Oral Q6H PRN Amin, Ankit Chirag, MD   650 mg at 02/21/22 0505   buPROPion ER (WELLBUTRIN SR) 12 hr tablet 100 mg  100 mg Oral BID Kc, Maren Beach, MD   100 mg at 02/21/22 1021   busPIRone (BUSPAR) tablet 15 mg  15 mg Oral TID Antonieta Pert, MD   15 mg at 02/21/22 1021   carvedilol (COREG) tablet 6.25 mg  6.25 mg Oral BID WC Kc, Ramesh, MD    6.25 mg at 02/21/22 1021   dexmedetomidine (PRECEDEX) 200 MCG/50ML (4 mcg/mL) infusion  0.2-0.7 mcg/kg/hr Intravenous Continuous Judd Lien, MD   Stopped at 02/18/22 0910   enoxaparin (LOVENOX) injection 40 mg  40 mg Subcutaneous Q24H Hall, Carole N, DO   40 mg at 02/21/22 1022   fluticasone (FLONASE) 50 MCG/ACT nasal spray 2 spray  2 spray Each Nare Daily PRN Antonieta Pert, MD       folic acid (FOLVITE) tablet 1 mg  1 mg Oral Daily Desai, Rahul P, PA-C   1 mg at 02/21/22 1022   guaiFENesin (ROBITUSSIN) 100 MG/5ML liquid 5 mL  5 mL Oral Q4H PRN Amin, Ankit Chirag, MD       hydrALAZINE (APRESOLINE) injection 10 mg  10 mg Intravenous Q4H PRN Amin, Ankit Chirag, MD   10 mg at 02/17/22 1637   ipratropium-albuterol (DUONEB) 0.5-2.5 (3) MG/3ML nebulizer solution 3 mL  3 mL Nebulization Q4H PRN Amin, Ankit Chirag, MD       LORazepam (ATIVAN) injection 1 mg  1 mg Intravenous Q4H PRN Shearon Stalls, Rahul P, PA-C   1 mg at 02/17/22 2343   LORazepam (ATIVAN) injection 2 mg  2 mg Intravenous Q6H Starkes-Perry, Gayland Curry, FNP       LORazepam (ATIVAN) tablet 1 mg  1 mg Oral Q6H PRN Amin, Ankit Chirag, MD   1 mg at 02/21/22 1021   melatonin tablet 6 mg  6 mg Oral QHS PRN Raenette Rover, NP   6 mg at 02/20/22 2156   metoprolol tartrate (LOPRESSOR) injection 5 mg  5 mg Intravenous Q4H PRN Amin, Jeanella Flattery, MD       multivitamin with minerals tablet 1 tablet  1 tablet Oral Daily Desai, Rahul P, PA-C   1 tablet at 02/21/22 1022   ondansetron (ZOFRAN) tablet 4 mg  4 mg Oral Q6H PRN Minda Ditto, RPH   4 mg at 02/20/22 0827   Oral care mouth rinse  15 mL Mouth Rinse PRN Irene Pap N, DO       polyethylene glycol (MIRALAX / GLYCOLAX) packet 17 g  17 g Oral Daily PRN Kayleen Memos, DO  prochlorperazine (COMPAZINE) injection 5 mg  5 mg Intravenous Q6H PRN Irene Pap N, DO   5 mg at 02/16/22 0557   senna-docusate (Senokot-S) tablet 1 tablet  1 tablet Oral QHS PRN Amin, Ankit Chirag, MD       thiamine (VITAMIN B1)  injection 500 mg  500 mg Intramuscular Q24H Amin, Jeanella Flattery, MD        Musculoskeletal: Strength & Muscle Tone: within normal limits Gait & Station: normal Patient leans: N/A            Psychiatric Specialty Exam:  Presentation  General Appearance:  Appropriate for Environment; Disheveled  Eye Contact: Fair  Speech: Clear and Coherent; Slow  Speech Volume: Normal  Handedness: Right   Mood and Affect  Mood: Anxious; Irritable  Affect: Appropriate   Thought Process  Thought Processes: Coherent; Linear  Descriptions of Associations:Intact  Orientation:Full (Time, Place and Person)  Thought Content:Tangential  History of Schizophrenia/Schizoaffective disorder:No data recorded Duration of Psychotic Symptoms:No data recorded Hallucinations:Hallucinations: None  Ideas of Reference:None  Suicidal Thoughts:Suicidal Thoughts: No  Homicidal Thoughts:Homicidal Thoughts: No   Sensorium  Memory: Immediate Fair; Recent Fair; Remote Poor  Judgment: Fair  Insight: Shallow   Executive Functions  Concentration: Fair  Attention Span: Fair  Recall: Fair  Fund of Knowledge: Fair  Language: Fair   Psychomotor Activity  Psychomotor Activity: Psychomotor Activity: Normal   Assets  Assets: Communication Skills; Desire for Improvement; Social Support; Resilience; Financial Resources/Insurance   Sleep  Sleep: Sleep: Poor   Physical Exam: Physical Exam Vitals and nursing note reviewed.  Constitutional:      Appearance: Normal appearance. She is normal weight.  Skin:    Capillary Refill: Capillary refill takes less than 2 seconds.  Neurological:     General: No focal deficit present.     Mental Status: She is alert and oriented to person, place, and time. Mental status is at baseline.  Psychiatric:        Mood and Affect: Mood normal.        Behavior: Behavior normal.        Thought Content: Thought content normal.     Review of Systems  Musculoskeletal:  Positive for back pain, joint pain and myalgias.  Neurological:  Positive for headaches.  Psychiatric/Behavioral:  Positive for depression. The patient is nervous/anxious and has insomnia.   All other systems reviewed and are negative.  Treatment Plan Summary: Daily contact with patient to assess and evaluate symptoms and progress in treatment, Medication management, and Plan    -Will continue Ativan challenge.  Administer Ativan 2 mg IV push every 6 hours x 24 hours. -We will also start thiamine 500 mg IV/IM.  Patient currently has no IV access, multiple attempts unsuccessful will require to picc (not necessary). -Continue delirium precautions at this time.  -Patient does not appear to be acute danger or imminent risk to self or others, does not meet criteria for involuntary commitment.  Psychiatry consult service will continue to follow. Disposition: No evidence of imminent risk to self or others at present.   Patient does not meet criteria for psychiatric inpatient admission. Supportive therapy provided about ongoing stressors. Discussed crisis plan, support from social network, calling 911, coming to the Emergency Department, and calling Suicide Hotline.

## 2022-02-21 NOTE — Progress Notes (Signed)
PROGRESS NOTE    Terri Ayala  ION:[id number] DOB: Jun 06, 1955 DOA: 02/15/2022 PCP: Lorrene Reid, PA-C   Brief Narrative:  66 year old with history of GAD, chronic depression, former alcohol use, HTN, chronic back pain brought to the ED for agitation.  Patient reported of being out of her medications and taking THC gummy and having hallucinations.  UDS was positive for benzodiazepine and THC.  UA was negative.  Lab work showed hypokalemia and slight metabolic acidosis.  Due to severe agitation, patient was started on phenobarb taper.  Psychiatry team was consulted.  CT of the head is negative.  Slowly mentation started improving.  Patient has been seen by psychiatry who recommended Ativan and thiamine.   Assessment & Plan:  Principal Problem:   Acute metabolic encephalopathy   Acute toxic encephalopathy with agitation hallucination, slowly improving Extensive history of polysubstance abuse UDS positive for THC and benzos GAD/depression: Likely polypharmacy or withdrawal from polysubstance abuse.  No obvious evidence of active infection.  Completed phenobarbital course.  Slowly patient's mentation is improving. Ammonia level normal TSH, folate, B12-normal.  No evidence of urinary retention Seen by psychiatry.  Currently on IV Ativan and IM thiamine CT head-negative  Hypokalemia  Off-and-on refusing blood work.   Mild metabolic acidosis  Refusing repeat lab work  Transaminitis  Stable   Mild leukocytosis  Resolved  Severe anxiety disorder  Seen by psychiatry  Alcohol abuse  continue CIWA scale Ativan    DVT prophylaxis: Lovenox Code Status: Full code Family Communication:    Status is: Inpatient Seen by psychiatry.  Awaiting safe disposition  Subjective: Patient is still off-and-on very drowsy.  Does not always participate in conversation.  With sternal rub she will arouse and answers basic questions Becomes very anxious whenever I wake her  up  Examination: Constitutional: Not in acute distress Respiratory: Clear to auscultation bilaterally Cardiovascular: Normal sinus rhythm, no rubs Abdomen: Nontender nondistended good bowel sounds Musculoskeletal: No edema noted Skin: No rashes seen Neurologic: CN 2-12 grossly intact.  And nonfocal Psychiatric: Poor judgment and insight.  Alert to name and place.  Overall very anxious whenever I wake her up.  Poor concentration  Objective: Vitals:   02/20/22 1513 02/20/22 1638 02/20/22 1921 02/21/22 0430  BP: 115/71  123/75 136/78  Pulse: 86  72 70  Resp:   18 18  Temp: 98 F (36.7 C)  98.6 F (37 C) 98.3 F (36.8 C)  TempSrc: Oral  Oral Oral  SpO2: 99%  99% 100%  Weight:      Height:  '5\' 2"'$  (1.575 m)      Intake/Output Summary (Last 24 hours) at 02/21/2022 0944 Last data filed at 02/21/2022 0400 Gross per 24 hour  Intake 180 ml  Output --  Net 180 ml   Filed Weights   02/17/22 0306  Weight: 56.3 kg     Data Reviewed:   CBC: Recent Labs  Lab 02/15/22 1630 02/16/22 0100 02/16/22 0324 02/17/22 0242 02/18/22 0311 02/19/22 1216 02/20/22 0755  WBC 9.2   < > 12.9* 13.1* 9.2 4.9 5.7  NEUTROABS 7.0  --   --   --   --   --   --   HGB 13.7   < > 12.2 11.7* 12.0 13.3 12.8  HCT 41.6   < > 35.6* 35.1* 37.2 39.7 38.0  MCV 95.4   < > 93.0 95.1 98.9 95.2 92.9  PLT 191   < > 217 203 173 157 215   < > =  values in this interval not displayed.   Basic Metabolic Panel: Recent Labs  Lab 02/16/22 0324 02/16/22 1641 02/17/22 0706 02/18/22 0311 02/19/22 1216 02/20/22 0755  NA 141 136 138 134* 137 137  K 2.8* 2.9* 3.7 3.1* 4.2 3.4*  CL 108 109 108 104 107 105  CO2 22 21* 21* 18* 19* 20*  GLUCOSE 110* 92 101* 78 93 94  BUN 12 9 6* 6* 5* 6*  CREATININE 0.74 0.73 0.61 0.65 0.62 0.69  CALCIUM 8.8* 8.6* 8.4* 8.1* 8.8* 9.1  MG 1.7 1.9 1.8 1.7 2.0 1.7  PHOS 3.3  --  2.7  --   --   --    GFR: Estimated Creatinine Clearance: 54 mL/min (by C-G formula based on SCr of  0.69 mg/dL). Liver Function Tests: Recent Labs  Lab 02/16/22 0324 02/17/22 0706 02/18/22 0311 02/19/22 1216 02/20/22 0755  AST 100* 77* 61* 60* 36  ALT 125* 98* 87* 69* 58*  ALKPHOS 42 39 40 47 45  BILITOT 1.3* 0.8 1.0 1.5* 0.5  PROT 6.6 6.0* 6.2* 7.1 7.0  ALBUMIN 3.6 3.5 3.4* 3.9 4.0   No results for input(s): "LIPASE", "AMYLASE" in the last 168 hours. Recent Labs  Lab 02/16/22 0324  AMMONIA 20   Coagulation Profile: No results for input(s): "INR", "PROTIME" in the last 168 hours. Cardiac Enzymes: No results for input(s): "CKTOTAL", "CKMB", "CKMBINDEX", "TROPONINI" in the last 168 hours. BNP (last 3 results) No results for input(s): "PROBNP" in the last 8760 hours. HbA1C: No results for input(s): "HGBA1C" in the last 72 hours. CBG: Recent Labs  Lab 02/19/22 1728 02/19/22 2359 02/20/22 1301 02/20/22 1732 02/20/22 2354  GLUCAP 84 107* 85 91 96   Lipid Profile: No results for input(s): "CHOL", "HDL", "LDLCALC", "TRIG", "CHOLHDL", "LDLDIRECT" in the last 72 hours. Thyroid Function Tests: No results for input(s): "TSH", "T4TOTAL", "FREET4", "T3FREE", "THYROIDAB" in the last 72 hours.  Anemia Panel: No results for input(s): "VITAMINB12", "FOLATE", "FERRITIN", "TIBC", "IRON", "RETICCTPCT" in the last 72 hours.  Sepsis Labs: No results for input(s): "PROCALCITON", "LATICACIDVEN" in the last 168 hours.  Recent Results (from the past 240 hour(s))  MRSA Next Gen by PCR, Nasal     Status: None   Collection Time: 02/15/22 11:43 PM   Specimen: Nasal Mucosa; Nasal Swab  Result Value Ref Range Status   MRSA by PCR Next Gen NOT DETECTED NOT DETECTED Final    Comment: (NOTE) The GeneXpert MRSA Assay (FDA approved for NASAL specimens only), is one component of a comprehensive MRSA colonization surveillance program. It is not intended to diagnose MRSA infection nor to guide or monitor treatment for MRSA infections. Test performance is not FDA approved in patients less than  20 years old. Performed at [facility], [place] [address], [place], [place] [address]          Radiology Studies: No results found.      Scheduled Meds:  buPROPion ER  100 mg Oral BID   busPIRone  15 mg Oral TID   carvedilol  6.25 mg Oral BID WC   enoxaparin (LOVENOX) injection  40 mg Subcutaneous G26R   folic acid  1 mg Oral Daily   LORazepam  2 mg Intravenous Q6H   multivitamin with minerals  1 tablet Oral Daily   thiamine (VITAMIN B1) injection  500 mg Intramuscular Q24H   Continuous Infusions:  dexmedetomidine (PRECEDEX) IV infusion Stopped (02/18/22 0910)     LOS: 6 days   Time spent= 35 mins  Shant Hence Arsenio Loader, MD [facility]  If 7PM-7AM, please contact night-coverage  02/21/2022, 9:44 AM

## 2022-02-22 DIAGNOSIS — G9341 Metabolic encephalopathy: Secondary | ICD-10-CM | POA: Diagnosis not present

## 2022-02-22 DIAGNOSIS — R443 Hallucinations, unspecified: Secondary | ICD-10-CM | POA: Diagnosis not present

## 2022-02-22 DIAGNOSIS — R410 Disorientation, unspecified: Secondary | ICD-10-CM | POA: Diagnosis not present

## 2022-02-22 LAB — GLUCOSE, CAPILLARY
Glucose-Capillary: 86 mg/dL (ref 70–99)
Glucose-Capillary: 93 mg/dL (ref 70–99)
Glucose-Capillary: 95 mg/dL (ref 70–99)
Glucose-Capillary: 97 mg/dL (ref 70–99)

## 2022-02-22 LAB — CBC
HCT: 40.8 % (ref 36.0–46.0)
Hemoglobin: 13.9 g/dL (ref 12.0–15.0)
MCH: 31.7 pg (ref 26.0–34.0)
MCHC: 34.1 g/dL (ref 30.0–36.0)
MCV: 93.2 fL (ref 80.0–100.0)
Platelets: 241 10*3/uL (ref 150–400)
RBC: 4.38 MIL/uL (ref 3.87–5.11)
RDW: 14.1 % (ref 11.5–15.5)
WBC: 5.7 10*3/uL (ref 4.0–10.5)
nRBC: 0 % (ref 0.0–0.2)

## 2022-02-22 LAB — BASIC METABOLIC PANEL
Anion gap: 13 (ref 5–15)
BUN: 11 mg/dL (ref 8–23)
CO2: 25 mmol/L (ref 22–32)
Calcium: 9.6 mg/dL (ref 8.9–10.3)
Chloride: 101 mmol/L (ref 98–111)
Creatinine, Ser: 0.87 mg/dL (ref 0.44–1.00)
GFR, Estimated: >60 mL/min (ref >60)
Glucose, Bld: 107 mg/dL — ABNORMAL HIGH (ref 70–99)
Potassium: 3.8 mmol/L (ref 3.5–5.1)
Sodium: 139 mmol/L (ref 135–145)

## 2022-02-22 LAB — MAGNESIUM: Magnesium: 1.7 mg/dL (ref 1.7–2.4)

## 2022-02-22 MED ORDER — HYDROCORTISONE 1 % EX CREA
1.0000 | TOPICAL_CREAM | Freq: Three times a day (TID) | CUTANEOUS | Status: DC | PRN
  Administered 2022-02-22 – 2022-02-23 (×2): 1 via TOPICAL
  Filled 2022-02-22: qty 28

## 2022-02-22 MED ORDER — BACLOFEN 10 MG PO TABS
5.0000 mg | ORAL_TABLET | Freq: Three times a day (TID) | ORAL | Status: DC
  Administered 2022-02-22 – 2022-02-25 (×9): 5 mg via ORAL
  Filled 2022-02-22 (×9): qty 1

## 2022-02-22 MED ORDER — LORAZEPAM 1 MG PO TABS
2.0000 mg | ORAL_TABLET | Freq: Four times a day (QID) | ORAL | Status: DC
  Administered 2022-02-22: 2 mg via ORAL
  Filled 2022-02-22 (×2): qty 2

## 2022-02-22 MED ORDER — LORAZEPAM 1 MG PO TABS
2.0000 mg | ORAL_TABLET | Freq: Three times a day (TID) | ORAL | Status: DC
  Administered 2022-02-22 – 2022-02-24 (×7): 2 mg via ORAL
  Filled 2022-02-22 (×8): qty 2

## 2022-02-22 NOTE — Consult Note (Signed)
[facility] Psychiatry Consult   Reason for Consult: Encephalopathy/confusion/hallucinations Referring Physician: Dr. Reesa Chew Patient Identification: Terri Ayala MRN:  [id number] Principal Diagnosis: Acute metabolic encephalopathy Diagnosis:  Principal Problem:   Acute metabolic encephalopathy  Total Time spent with patient: 45 minutes  Subjective:   Terri Ayala is a 66 y.o. female patient admitted with hallucinations.  Patient seen and reassessed today. On evaluation she was able to arouse easily without incident, once awake she was very appropriate and engaged well. She was able to display clear, coherent and linear thought processes. She was able to have an appropriate conversation. She tells me she lives alone with her dog, and her friends are taking care of her daughter while she is in the hospital.  She is able to provide information regarding her baclofen, and reports consistently and compliance up to current hospitalization.  She is able to show some improvement in her insight and judgment, as evidenced by "why did stop my baclofen anyway?"  Discussed with patient initial causes and discussion surrounding her acute encephalopathy that resulted in admission to the hospital.    Psychiatry was initially consulted for underlying  On reassessment patient showed marked improvement in reduction in her psychiatric symptoms. After reassessment she is alert and oriented x 3, able to verbally communicate her thoughts, she answers all questions appropriately.  She continues to improve today and is more alert with awakening and responsive. She does not appear to be responding to internal stimuli, external stimuli, and or exhibiting delusional thought disorder.  She denies any current suicidal ideations and or homicidal ideations.  She does admit to some depression, however adamantly denies suicidality.    HPI: 66 year old with history of GAD, chronic depression, former alcohol use, HTN, chronic back  pain brought to the ED for agitation.  Patient reported of being out of her medications and taking THC gummy and having hallucinations.  UDS was positive for benzodiazepine and THC.  UA was negative.  Lab work showed hypokalemia and slight metabolic acidosis.  Due to severe agitation, patient was started on phenobarb taper.  Psychiatry team was consulted.  CT of the head is negative.  Slowly mentation started improving.  Patient has been seen by psychiatry who recommended Ativan and thiamine.    Past Psychiatric History: Alcohol use disorder severe, generalized anxiety.  Currently receiving no outpatient psychiatric services.  [facility], chemical dependency intensive outpatient program.  Last appointment was October 2023.  Patient has most recent history of inpatient psychiatric hospitalization at [facility] in May 2023 for alcohol dependence and major depression disorder.  She was discharged on buspirone, duloxetine, hydroxyzine, trazodone.  She denies any history of recent suicide attempts.  Risk to Self:  Denies Risk to Others:  Denies Prior Inpatient Therapy:  Most recent [facility] May 2023 Prior Outpatient Therapy:  :[facility]  Past Medical History:  Past Medical History:  Diagnosis Date   Alcohol withdrawal ([place]) 11/2017; 01/04/2018   Alcoholism ([place])    Anxiety    Arthritis    "hands" (01/04/2018)   ASYMPTOMATIC POSTMENOPAUSAL STATUS 08/17/2009   Chronic lower back pain    Depression    DYSPHAGIA 06/19/2007   Fatty liver, alcoholic    GERD (gastroesophageal reflux disease)    GOITER, MULTINODULAR 08/17/2009   Hyperlipidemia    Hypertension    Migraines    "not sure what triggers them; I'll have 1 q couple weeks or more; come 2 days  in a row when they come" (01/04/2018)   Mitral valve prolapse    OSTEOPOROSIS 08/17/2009   PONV (postoperative nausea and vomiting)    Supraventricular tachycardia     Past Surgical  History:  Procedure Laterality Date   BREAST BIOPSY Right    "benign"   CATARACT EXTRACTION W/ INTRAOCULAR LENS  IMPLANT, BILATERAL     CERVICAL CONE BIOPSY  2000s   CERVIX LESION DESTRUCTION  1991   "dysplasia; lesions"   CHOLECYSTECTOMY N/A 03/14/2013   Procedure: LAPAROSCOPIC CHOLECYSTECTOMY WITH ATTEMPTED INTRAOPERATIVE CHOLANGIOGRAM;  Surgeon: Harl Bowie, MD;  Location: [facility];  Service: General;  Laterality: N/A;   HARDWARE REMOVAL Left 07/22/2021   Procedure: HARDWARE REMOVAL;  Surgeon: Altamese North Vandergrift, MD;  Location: [place];  Service: Orthopedics;  Laterality: Left;   laporoscopic abdominal surgery     "for endometriosis"   LEFT HEART CATH AND CORONARY ANGIOGRAPHY N/A 05/07/2018   Procedure: LEFT HEART CATH AND CORONARY ANGIOGRAPHY;  Surgeon: Belva Crome, MD;  Location: [place] [facility];  Service: Cardiovascular;  Laterality: N/A;   ORIF ANKLE FRACTURE Left 02/11/2021   Procedure: OPEN REDUCTION INTERNAL FIXATION (ORIF) ANKLE FRACTURE;  Surgeon: Altamese North Hurley, MD;  Location: [place];  Service: Orthopedics;  Laterality: Left;   TONSILLECTOMY     Family History:  Family History  Problem Relation Age of Onset   Colon cancer Father        Deceased, 46   Osteoporosis Mother        Living, 44   Healthy Brother    Healthy Brother    Healthy Son    Healthy Son    Thyroid disease Neg Hx    Goiter Neg Hx    Family Psychiatric  History: Substance use history Social History:  Social History   Substance and Sexual Activity  Alcohol Use Not Currently   Alcohol/week: 28.0 standard drinks of alcohol   Types: 28 Glasses of wine per week   Comment: I had Detox on 07/13/21 and no alcohol since then     Social History   Substance and Sexual Activity  Drug Use Never    Social History   Socioeconomic History   Marital status: Single    Spouse name: Not on file   Number of children: Not on file   Years of education: Not on file   Highest education level: Not on file   Occupational History   Occupation: Brewing technologist - retired    Fish farm manager: [employer]  Tobacco Use   Smoking status: Former    Packs/day: 1.00    Years: 7.00    Total pack years: 7.00    Types: Cigarettes    Quit date: 02/28/1974    Years since quitting: 48.0   Smokeless tobacco: Never   Tobacco comments:    01/04/2018 "smoked when I was a teenager"  Vaping Use   Vaping Use: Never used  Substance and Sexual Activity   Alcohol use: Not Currently    Alcohol/week: 28.0 standard drinks of alcohol    Types: 28 Glasses of wine per week    Comment: I had Detox on 07/13/21 and no alcohol since then   Drug use: Never   Sexual activity: Not Currently  Other Topics Concern   Not on file  Social History Narrative   Lives alone.  She has a BS biology.   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   Social Determinants of Health   Financial  Resource Strain: Not on file  Food Insecurity: Not on file  Transportation Needs: Not on file  Physical Activity: Not on file  Stress: Not on file  Social Connections: Not on file   Additional Social History:    Allergies:   Allergies  Allergen Reactions   Doxycycline Swelling    Mouth swelling and sores  Other Reaction(s): Not available   Codeine Itching    Other Reaction(s): Not available   Sulfa Antibiotics     Possibly??   Tetracycline Hcl Swelling    Mouth swelling and sores    Labs:  Results for orders placed or performed during the hospital encounter of 02/15/22 (from the past 48 hour(s))  Glucose, capillary     Status: None   Collection Time: 02/20/22  5:32 PM  Result Value Ref Range   Glucose-Capillary 91 70 - 99 mg/dL    Comment: Glucose reference range applies only to samples taken after fasting for at least 8 hours.  Glucose, capillary     Status: None   Collection Time: 02/20/22 11:54 PM  Result Value Ref Range   Glucose-Capillary 96 70 - 99 mg/dL    Comment: Glucose reference range  applies only to samples taken after fasting for at least 8 hours.  Glucose, capillary     Status: None   Collection Time: 02/21/22 12:20 PM  Result Value Ref Range   Glucose-Capillary 83 70 - 99 mg/dL    Comment: Glucose reference range applies only to samples taken after fasting for at least 8 hours.  Comprehensive metabolic panel     Status: Abnormal   Collection Time: 02/21/22  1:08 PM  Result Value Ref Range   Sodium 140 135 - 145 mmol/L   Potassium 3.9 3.5 - 5.1 mmol/L   Chloride 103 98 - 111 mmol/L   CO2 25 22 - 32 mmol/L   Glucose, Bld 91 70 - 99 mg/dL    Comment: Glucose reference range applies only to samples taken after fasting for at least 8 hours.   BUN 9 8 - 23 mg/dL   Creatinine, Ser 0.79 0.44 - 1.00 mg/dL   Calcium 10.0 8.9 - 10.3 mg/dL   Total Protein 7.6 6.5 - 8.1 g/dL   Albumin 4.3 3.5 - 5.0 g/dL   AST 31 15 - 41 U/L   ALT 47 (H) 0 - 44 U/L   Alkaline Phosphatase 53 38 - 126 U/L   Total Bilirubin 0.7 0.3 - 1.2 mg/dL   GFR, Estimated >60 >60 mL/min    Comment: (NOTE) Calculated using the CKD-EPI Creatinine Equation (2021)    Anion gap 12 5 - 15    Comment: Performed at [facility], [place] [address] [place], [place] [address]  CBC     Status: None   Collection Time: 02/21/22  1:08 PM  Result Value Ref Range   WBC 5.5 4.0 - 10.5 K/uL   RBC 4.32 3.87 - 5.11 MIL/uL   Hemoglobin 13.6 12.0 - 15.0 g/dL   HCT 40.1 36.0 - 46.0 %   MCV 92.8 80.0 - 100.0 fL   MCH 31.5 26.0 - 34.0 pg   MCHC 33.9 30.0 - 36.0 g/dL   RDW 14.1 11.5 - 15.5 %   Platelets 230 150 - 400 K/uL   nRBC 0.0 0.0 - 0.2 %    Comment: Performed at [facility], [place] [address] [place], [place] [address]  Magnesium     Status: None   Collection Time:  02/21/22  1:08 PM  Result Value Ref Range   Magnesium 1.8 1.7 - 2.4 mg/dL    Comment: Performed at [facility], [place] [address] [place], [place] [address]  Glucose, capillary     Status:  Abnormal   Collection Time: 02/21/22  5:37 PM  Result Value Ref Range   Glucose-Capillary 118 (H) 70 - 99 mg/dL    Comment: Glucose reference range applies only to samples taken after fasting for at least 8 hours.  Glucose, capillary     Status: None   Collection Time: 02/22/22 12:03 AM  Result Value Ref Range   Glucose-Capillary 97 70 - 99 mg/dL    Comment: Glucose reference range applies only to samples taken after fasting for at least 8 hours.  Glucose, capillary     Status: None   Collection Time: 02/22/22  6:28 AM  Result Value Ref Range   Glucose-Capillary 95 70 - 99 mg/dL    Comment: Glucose reference range applies only to samples taken after fasting for at least 8 hours.  Basic metabolic panel     Status: Abnormal   Collection Time: 02/22/22 11:51 AM  Result Value Ref Range   Sodium 139 135 - 145 mmol/L   Potassium 3.8 3.5 - 5.1 mmol/L   Chloride 101 98 - 111 mmol/L   CO2 25 22 - 32 mmol/L   Glucose, Bld 107 (H) 70 - 99 mg/dL    Comment: Glucose reference range applies only to samples taken after fasting for at least 8 hours.   BUN 11 8 - 23 mg/dL   Creatinine, Ser 0.87 0.44 - 1.00 mg/dL   Calcium 9.6 8.9 - 10.3 mg/dL   GFR, Estimated >60 >60 mL/min    Comment: (NOTE) Calculated using the CKD-EPI Creatinine Equation (2021)    Anion gap 13 5 - 15    Comment: Performed at [facility], [place] [address], [place], [place] [address]  CBC     Status: None   Collection Time: 02/22/22 11:51 AM  Result Value Ref Range   WBC 5.7 4.0 - 10.5 K/uL   RBC 4.38 3.87 - 5.11 MIL/uL   Hemoglobin 13.9 12.0 - 15.0 g/dL   HCT 40.8 36.0 - 46.0 %   MCV 93.2 80.0 - 100.0 fL   MCH 31.7 26.0 - 34.0 pg   MCHC 34.1 30.0 - 36.0 g/dL   RDW 14.1 11.5 - 15.5 %   Platelets 241 150 - 400 K/uL   nRBC 0.0 0.0 - 0.2 %    Comment: Performed at [facility], [place] [address] [place], [place] [address]  Magnesium     Status: None   Collection Time: 02/22/22 11:51 AM   Result Value Ref Range   Magnesium 1.7 1.7 - 2.4 mg/dL    Comment: Performed at [facility], [place] [address] [place], [place] [address]    Current Facility-Administered Medications  Medication Dose Route Frequency Provider Last Rate Last Admin   acetaminophen (TYLENOL) tablet 650 mg  650 mg Oral Q6H PRN Amin, Ankit Chirag, MD   650 mg at 02/22/22 1111   buPROPion ER (WELLBUTRIN SR) 12 hr tablet 100 mg  100 mg Oral BID Kc, Ramesh, MD   100 mg at 02/22/22 1111   busPIRone (BUSPAR) tablet 15 mg  15 mg Oral TID Antonieta Pert, MD   15 mg at 02/22/22 1111   carvedilol (COREG) tablet 6.25 mg  6.25 mg Oral BID WC Kc,  Maren [place], MD   6.25 mg at 02/22/22 1111   enoxaparin (LOVENOX) injection 40 mg  40 mg Subcutaneous Q24H Hall, Carole N, DO   40 mg at 02/22/22 1111   fluticasone (FLONASE) 50 MCG/ACT nasal spray 2 spray  2 spray Each Nare Daily PRN Antonieta Pert, MD       folic acid (FOLVITE) tablet 1 mg  1 mg Oral Daily Desai, Rahul P, PA-C   1 mg at 02/22/22 1111   guaiFENesin (ROBITUSSIN) 100 MG/5ML liquid 5 mL  5 mL Oral Q4H PRN Amin, Ankit Chirag, MD   5 mL at 02/21/22 1435   hydrALAZINE (APRESOLINE) injection 10 mg  10 mg Intravenous Q4H PRN Amin, Ankit Chirag, MD   10 mg at 02/17/22 1637   ipratropium-albuterol (DUONEB) 0.5-2.5 (3) MG/3ML nebulizer solution 3 mL  3 mL Nebulization Q4H PRN Amin, Ankit Chirag, MD       LORazepam (ATIVAN) tablet 1 mg  1 mg Oral Q6H PRN Amin, Ankit Chirag, MD   1 mg at 02/21/22 2146   LORazepam (ATIVAN) tablet 2 mg  2 mg Oral TID Suella Broad, FNP       melatonin tablet 6 mg  6 mg Oral QHS PRN Raenette Rover, NP   6 mg at 02/21/22 2150   metoprolol tartrate (LOPRESSOR) injection 5 mg  5 mg Intravenous Q4H PRN Amin, Jeanella Flattery, MD       multivitamin with minerals tablet 1 tablet  1 tablet Oral Daily Desai, Rahul P, PA-C   1 tablet at 02/22/22 1111   ondansetron (ZOFRAN) tablet 4 mg  4 mg Oral Q6H PRN Minda Ditto, RPH   4 mg at 02/20/22 0827    Oral care mouth rinse  15 mL Mouth Rinse PRN Irene Pap N, DO       polyethylene glycol (MIRALAX / GLYCOLAX) packet 17 g  17 g Oral Daily PRN Irene Pap N, DO       prochlorperazine (COMPAZINE) injection 5 mg  5 mg Intravenous Q6H PRN Irene Pap N, DO   5 mg at 02/16/22 0557   senna-docusate (Senokot-S) tablet 1 tablet  1 tablet Oral QHS PRN Amin, Ankit Chirag, MD       thiamine (VITAMIN B1) injection 500 mg  500 mg Intramuscular Q24H Amin, Ankit Chirag, MD   500 mg at 02/21/22 1705    Musculoskeletal: Strength & Muscle Tone: within normal limits Gait & Station: normal Patient leans: N/A            Psychiatric Specialty Exam:  Presentation  General Appearance:  Appropriate for Environment; Disheveled  Eye Contact: Fair  Speech: Clear and Coherent; Slow  Speech Volume: Normal  Handedness: Right   Mood and Affect  Mood: Anxious; Irritable  Affect: Appropriate   Thought Process  Thought Processes: Coherent; Linear  Descriptions of Associations:Intact  Orientation:Full (Time, Place and Person)  Thought Content:Tangential  History of Schizophrenia/Schizoaffective disorder:No data recorded Duration of Psychotic Symptoms:No data recorded Hallucinations:Hallucinations: None  Ideas of Reference:None  Suicidal Thoughts:Suicidal Thoughts: No  Homicidal Thoughts:Homicidal Thoughts: No   Sensorium  Memory: Immediate Fair; Recent Fair; Remote Poor  Judgment: Fair  Insight: Shallow   Executive Functions  Concentration: Fair  Attention Span: Fair  Recall: Fair  Fund of Knowledge: Fair  Language: Fair   Psychomotor Activity  Psychomotor Activity: Psychomotor Activity: Normal   Assets  Assets: Communication Skills; Desire for Improvement; Social Support; Resilience; Financial Resources/Insurance   Sleep  Sleep: Sleep: Poor  Physical Exam: Physical Exam Vitals and nursing note reviewed.  Constitutional:       Appearance: Normal appearance. She is normal weight.  Skin:    Capillary Refill: Capillary refill takes less than 2 seconds.  Neurological:     General: No focal deficit present.     Mental Status: She is alert and oriented to person, place, and time. Mental status is at baseline.  Psychiatric:        Mood and Affect: Mood normal.        Behavior: Behavior normal.        Thought Content: Thought content normal.    Review of Systems  Musculoskeletal:  Positive for back pain, joint pain and myalgias.  Neurological:  Positive for headaches.  Psychiatric/Behavioral:  Positive for depression. The patient is nervous/anxious and has insomnia.   All other systems reviewed and are negative.  Treatment Plan Summary: Daily contact with patient to assess and evaluate symptoms and progress in treatment, Medication management, and Plan    -Will continue Ativan challenge.  Administer Ativan '2mg'$  po Q8hr  x 24 hours. -We will also continue thiamine 500 mg IV/IM.  Patient currently has no IV access, multiple attempts unsuccessful will require to picc (not necessary). Restart baclofen at 50% home dose.  Baclofen 5 mg p.o. 3 times daily -Continue delirium precautions at this time.  -Patient does not appear to be acute danger or imminent risk to self or others, does not meet criteria for involuntary commitment.  Psychiatry consult service will continue to follow. Disposition: No evidence of imminent risk to self or others at present.   Patient does not meet criteria for psychiatric inpatient admission. Supportive therapy provided about ongoing stressors. Discussed crisis plan, support from social network, calling 911, coming to the Emergency Department, and calling Suicide Hotline.

## 2022-02-22 NOTE — Progress Notes (Signed)
PROGRESS NOTE    Terri Ayala  AJG:[id number] DOB: [birth year] DOA: 02/15/2022 PCP: Lorrene Reid, PA-C   Brief Narrative:  66 year old with history of GAD, chronic depression, former alcohol use, HTN, chronic back pain brought to the ED for agitation.  Patient reported of being out of her medications and taking THC gummy and having hallucinations.  UDS was positive for benzodiazepine and THC.  UA was negative.  Lab work showed hypokalemia and slight metabolic acidosis.  Due to severe agitation, patient was started on phenobarb taper.  Psychiatry team was consulted.  CT of the head is negative.  Slowly mentation started improving.  Patient has been seen by psychiatry who recommended Ativan and thiamine.   Assessment & Plan:  Principal Problem:   Acute metabolic encephalopathy   Acute toxic encephalopathy with agitation hallucination, slowly improving Extensive history of polysubstance abuse UDS positive for THC and benzos GAD/depression: Likely polypharmacy or withdrawal from polysubstance abuse.  No obvious signs of infection, completed course of phenobarbital.  Mentation slowly improving. TSH, folate, B12-normal.  No evidence of urinary retention Seen by psychiatry.  Currently on IM thiamine, today is the third day.  She keeps losing IV therefore transition her to p.o. Ativan for now.  I am not sure if she will be here for a long time therefore holding off on placing PICC line CT head-negative  Hypokalemia  Improved   Mild metabolic acidosis  Resolved  Transaminitis  Stable   Mild leukocytosis  Resolved  Severe anxiety disorder  Seen by psychiatry-currently on scheduled Ativan  Alcohol abuse  continue CIWA scale Ativan    DVT prophylaxis: Lovenox Code Status: Full code Family Communication:    Status is: Inpatient Seen by psychiatry.  Awaiting safe disposition  Subjective: Seen and examined at bedside, she is arousable.  Answers basic questions but does not  participate in long conversations. At times she is very restless  Examination: Constitutional: Not in acute distress Respiratory: Clear to auscultation bilaterally Cardiovascular: Normal sinus rhythm, no rubs Abdomen: Nontender nondistended good bowel sounds Musculoskeletal: No edema noted Skin: No rashes seen Neurologic: CN 2-12 grossly intact.  And nonfocal Psychiatric: Poor judgment and insight. Alert and oriented x 3. Normal mood.  Objective: Vitals:   02/21/22 0430 02/21/22 1434 02/21/22 2017 02/22/22 0630  BP: 136/78 126/74 136/78 129/73  Pulse: 70 89 79 87  Resp: '18 18  16  '$ Temp: 98.3 F (36.8 C) 98.1 F (36.7 C) 98.7 F (37.1 C) 98.3 F (36.8 C)  TempSrc: Oral Oral Oral Oral  SpO2: 100% 100% 100% 95%  Weight:      Height:        Intake/Output Summary (Last 24 hours) at 02/22/2022 0753 Last data filed at 02/21/2022 1300 Gross per 24 hour  Intake 337 ml  Output --  Net 337 ml   Filed Weights   02/17/22 0306  Weight: 56.3 kg     Data Reviewed:   CBC: Recent Labs  Lab 02/15/22 1630 02/16/22 0100 02/17/22 0242 02/18/22 0311 02/19/22 1216 02/20/22 0755 02/21/22 1308  WBC 9.2   < > 13.1* 9.2 4.9 5.7 5.5  NEUTROABS 7.0  --   --   --   --   --   --   HGB 13.7   < > 11.7* 12.0 13.3 12.8 13.6  HCT 41.6   < > 35.1* 37.2 39.7 38.0 40.1  MCV 95.4   < > 95.1 98.9 95.2 92.9 92.8  PLT 191   < > 203  173 157 215 230   < > = values in this interval not displayed.   Basic Metabolic Panel: Recent Labs  Lab 02/16/22 0324 02/16/22 1641 02/17/22 0706 02/18/22 0311 02/19/22 1216 02/20/22 0755 02/21/22 1308  NA 141   < > 138 134* 137 137 140  K 2.8*   < > 3.7 3.1* 4.2 3.4* 3.9  CL 108   < > 108 104 107 105 103  CO2 22   < > 21* 18* 19* 20* 25  GLUCOSE 110*   < > 101* 78 93 94 91  BUN 12   < > 6* 6* 5* 6* 9  CREATININE 0.74   < > 0.61 0.65 0.62 0.69 0.79  CALCIUM 8.8*   < > 8.4* 8.1* 8.8* 9.1 10.0  MG 1.7   < > 1.8 1.7 2.0 1.7 1.8  PHOS 3.3  --  2.7  --    --   --   --    < > = values in this interval not displayed.   GFR: Estimated Creatinine Clearance: 54 mL/min (by C-G formula based on SCr of 0.79 mg/dL). Liver Function Tests: Recent Labs  Lab 02/17/22 0706 02/18/22 0311 02/19/22 1216 02/20/22 0755 02/21/22 1308  AST 77* 61* 60* 36 31  ALT 98* 87* 69* 58* 47*  ALKPHOS 39 40 47 45 53  BILITOT 0.8 1.0 1.5* 0.5 0.7  PROT 6.0* 6.2* 7.1 7.0 7.6  ALBUMIN 3.5 3.4* 3.9 4.0 4.3   No results for input(s): "LIPASE", "AMYLASE" in the last 168 hours. Recent Labs  Lab 02/16/22 0324  AMMONIA 20   Coagulation Profile: No results for input(s): "INR", "PROTIME" in the last 168 hours. Cardiac Enzymes: No results for input(s): "CKTOTAL", "CKMB", "CKMBINDEX", "TROPONINI" in the last 168 hours. BNP (last 3 results) No results for input(s): "PROBNP" in the last 8760 hours. HbA1C: No results for input(s): "HGBA1C" in the last 72 hours. CBG: Recent Labs  Lab 02/20/22 2354 02/21/22 1220 02/21/22 1737 02/22/22 0003 02/22/22 0628  GLUCAP 96 83 118* 97 95   Lipid Profile: No results for input(s): "CHOL", "HDL", "LDLCALC", "TRIG", "CHOLHDL", "LDLDIRECT" in the last 72 hours. Thyroid Function Tests: No results for input(s): "TSH", "T4TOTAL", "FREET4", "T3FREE", "THYROIDAB" in the last 72 hours.  Anemia Panel: No results for input(s): "VITAMINB12", "FOLATE", "FERRITIN", "TIBC", "IRON", "RETICCTPCT" in the last 72 hours.  Sepsis Labs: No results for input(s): "PROCALCITON", "LATICACIDVEN" in the last 168 hours.  Recent Results (from the past 240 hour(s))  MRSA Next Gen by PCR, Nasal     Status: None   Collection Time: 02/15/22 11:43 PM   Specimen: Nasal Mucosa; Nasal Swab  Result Value Ref Range Status   MRSA by PCR Next Gen NOT DETECTED NOT DETECTED Final    Comment: (NOTE) The GeneXpert MRSA Assay (FDA approved for NASAL specimens only), is one component of a comprehensive MRSA colonization surveillance program. It is not intended  to diagnose MRSA infection nor to guide or monitor treatment for MRSA infections. Test performance is not FDA approved in patients less than 14 years old. Performed at [facility], [place] [address] [place], [place] [address]          Radiology Studies: No results found.      Scheduled Meds:  buPROPion ER  100 mg Oral BID   busPIRone  15 mg Oral TID   carvedilol  6.25 mg Oral BID WC   enoxaparin (LOVENOX) injection  40 mg Subcutaneous Q24H  folic acid  1 mg Oral Daily   LORazepam  2 mg Oral Q6H   multivitamin with minerals  1 tablet Oral Daily   thiamine (VITAMIN B1) injection  500 mg Intramuscular Q24H   Continuous Infusions:  dexmedetomidine (PRECEDEX) IV infusion Stopped (02/18/22 0910)     LOS: 7 days   Time spent= 35 mins    Wei Newbrough Arsenio Loader, MD [facility]  If 7PM-7AM, please contact night-coverage  02/22/2022, 7:53 AM

## 2022-02-23 DIAGNOSIS — G9341 Metabolic encephalopathy: Secondary | ICD-10-CM | POA: Diagnosis not present

## 2022-02-23 LAB — BASIC METABOLIC PANEL
Anion gap: 13 (ref 5–15)
BUN: 9 mg/dL (ref 8–23)
CO2: 20 mmol/L — ABNORMAL LOW (ref 22–32)
Calcium: 9.1 mg/dL (ref 8.9–10.3)
Chloride: 105 mmol/L (ref 98–111)
Creatinine, Ser: 0.7 mg/dL (ref 0.44–1.00)
GFR, Estimated: >60 mL/min (ref >60)
Glucose, Bld: 84 mg/dL (ref 70–99)
Potassium: 4.1 mmol/L (ref 3.5–5.1)
Sodium: 138 mmol/L (ref 135–145)

## 2022-02-23 LAB — CBC
HCT: 40.8 % (ref 36.0–46.0)
Hemoglobin: 13.6 g/dL (ref 12.0–15.0)
MCH: 31.5 pg (ref 26.0–34.0)
MCHC: 33.3 g/dL (ref 30.0–36.0)
MCV: 94.4 fL (ref 80.0–100.0)
Platelets: 224 10*3/uL (ref 150–400)
RBC: 4.32 MIL/uL (ref 3.87–5.11)
RDW: 14 % (ref 11.5–15.5)
WBC: 5.2 10*3/uL (ref 4.0–10.5)
nRBC: 0 % (ref 0.0–0.2)

## 2022-02-23 LAB — MAGNESIUM: Magnesium: 2 mg/dL (ref 1.7–2.4)

## 2022-02-23 LAB — GLUCOSE, CAPILLARY
Glucose-Capillary: 108 mg/dL — ABNORMAL HIGH (ref 70–99)
Glucose-Capillary: 103 mg/dL — ABNORMAL HIGH (ref 70–99)
Glucose-Capillary: 104 mg/dL — ABNORMAL HIGH (ref 70–99)

## 2022-02-23 MED ORDER — THIAMINE MONONITRATE 100 MG PO TABS
100.0000 mg | ORAL_TABLET | Freq: Every day | ORAL | Status: DC
  Administered 2022-02-23 – 2022-02-25 (×3): 100 mg via ORAL
  Filled 2022-02-23 (×3): qty 1

## 2022-02-23 MED ORDER — ENSURE ENLIVE PO LIQD
237.0000 mL | Freq: Two times a day (BID) | ORAL | Status: DC
  Administered 2022-02-24 (×2): 237 mL via ORAL

## 2022-02-23 NOTE — Progress Notes (Signed)
Initial Nutrition Assessment  DOCUMENTATION CODES:   Not applicable  INTERVENTION:  Ensure Enlive po BID, each supplement provides 350 kcal and 20 grams of protein. Magic cup TID with meals, each supplement provides 290 kcal and 9 grams of protein Continue MVI with minerals daily Recommend bowel regimen as last documented BM was on 12/22 Consider alternate nutrition access if pt remains inpatient and continues with poor PO intake  NUTRITION DIAGNOSIS:   Inadequate oral intake related to acute illness as evidenced by meal completion < 25%.  GOAL:   Patient will meet greater than or equal to 90% of their needs  MONITOR:   PO intake, Supplement acceptance, Labs, Weight trends  REASON FOR ASSESSMENT:   Malnutrition Screening Tool    ASSESSMENT:   Pt admitted from home d/t panic attack and agitation. PMH significant for GAD, chronic depression, former alcohol use, essential HTN, chronic back pain. Recently admitted in November with similar presentation.  Psych following. A/o x3. Recommend continuing thiamine.  Pt sleeping soundly at time of visit. Attempted to call pt's name but did not wake up. Per RN, pt frustrated today about discharge planning. Did not try to further disrupt pt's rest. No family/friends present at bedside. Will attempt to gather more detailed nutrition related history upon follow up.   PO intake appears to remain poor throughout admission. 0% documented for most meals. Noted lunch tray on bedside tray with 0% eaten today. Will add nutrition supplements and adjust as appropriate throughout admission.   If pt remains admitted and continues with poor PO intake, wouldn't recommend placement of small bored feeding tube to meet nutrition needs until PO intake begins to improve.  Reviewed weight history. Her weight had remained stable around 65-66 kg until 06/02. Since this time she is noted to have had a 13.8% weight loss which is clinically significant for time  frame.   Medications: folvite, MVI, thiamine   Labs: reviewed  NUTRITION - FOCUSED PHYSICAL EXAM: Pt sleeping. Deferred to follow up. Highly suspect pt with a degree of malnutrition present.   Diet Order:   Diet Order             Diet regular Room service appropriate? Yes; Fluid consistency: Thin  Diet effective now                   EDUCATION NEEDS:   No education needs have been identified at this time  Skin:  Skin Assessment: Reviewed RN Assessment  Last BM:  12/22  Height:   Ht Readings from Last 1 Encounters:  02/20/22 '5\' 2"'$  (1.575 m)    Weight:   Wt Readings from Last 1 Encounters:  02/17/22 56.3 kg   BMI:  Body mass index is 22.7 kg/m.  Estimated Nutritional Needs:   Kcal:  1500-1700  Protein:  75-90g  Fluid:  >/=1.5L  Clayborne Dana, RDN, LDN Clinical Nutrition

## 2022-02-23 NOTE — Evaluation (Signed)
Occupational Therapy Evaluation Patient Details Name: Terri Ayala MRN: [id number] DOB: Aug 04, 1955 Today's Date: 02/23/2022   History of Present Illness Patient is a 66 year old female who presented after running out of medications,   taking THC gummy and having hallucinations.patient was admitted with acute toxic encephalopathy with agitation hallucination. PMH: depression, GAD, hypokalemia, alcohol abuse.   Clinical Impression   Patient is a 66 year old female who was admitted for above. Patient appears to be near baseline with supervision for safety with ADLs. No family/friend in room to provide PLOF. Patient was noted to be fixated on transitioning home today and figuring out what her diagnosis was. Nurse made aware. OT to sign off at this time with recommendation for patient to have support in next level of care as listed below.       Recommendations for follow up therapy are one component of a multi-disciplinary discharge planning process, led by the attending physician.  Recommendations may be updated based on patient status, additional functional criteria and insurance authorization.   Follow Up Recommendations  No OT follow up     Assistance Recommended at Discharge Intermittent Supervision/Assistance  Patient can return home with the following Assistance with cooking/housework;Direct supervision/assist for medications management;Assist for transportation;Direct supervision/assist for financial management    Functional Status Assessment  Patient has not had a recent decline in their functional status  Equipment Recommendations       Recommendations for Other Services       Precautions / Restrictions Precautions Precautions: Fall Restrictions Weight Bearing Restrictions: No      Mobility Bed Mobility Overal bed mobility: Needs Assistance Bed Mobility: Supine to Sit, Sit to Supine     Supine to sit: Supervision Sit to supine: Supervision   General bed mobility  comments: supervision for safety    Transfers Overall transfer level: Needs assistance Equipment used: None Transfers: Sit to/from Stand Sit to Stand: Supervision           General transfer comment: no physical assist; guarding for safety      Balance Overall balance assessment: Mild deficits observed, not formally tested               ADL either performed or assessed with clinical judgement   ADL Overall ADL's : Needs assistance/impaired   Eating/Feeding Details (indicate cue type and reason): patient declined to eat Grooming: Supervision/safety   Upper Body Bathing: Set up;Sitting   Lower Body Bathing: Sitting/lateral leans;Set up Lower Body Bathing Details (indicate cue type and reason): able to complete simulated reaching tasks at bed level Upper Body Dressing : Set up;Sitting   Lower Body Dressing: Sitting/lateral leans;Set up   Toilet Transfer: Supervision/safety Toilet Transfer Details (indicate cue type and reason): patient was able to complete ambulation in hallway with no AD with no LOB. patient was able to walk 6 floor with one standing rest break to read a sign in the hallway. Toileting- Clothing Manipulation and Hygiene: Supervision/safety       Functional mobility during ADLs: Supervision/safety       Vision   Vision Assessment?: No apparent visual deficits            Pertinent Vitals/Pain Pain Assessment Pain Assessment: No/denies pain     Hand Dominance Right   Extremity/Trunk Assessment Upper Extremity Assessment Upper Extremity Assessment: Overall WFL for tasks assessed   Lower Extremity Assessment Lower Extremity Assessment: Defer to PT evaluation   Cervical / Trunk Assessment Cervical / Trunk Assessment: Normal  Communication Communication Communication: HOH   Cognition Arousal/Alertness: Awake/alert Behavior During Therapy: WFL for tasks assessed/performed Overall Cognitive Status: No family/caregiver present to  determine baseline cognitive functioning           General Comments: patient was fixated on transitioning home today.                Home Living Family/patient expects to be discharged to:: Private residence Living Arrangements: Alone Available Help at Discharge: Personal care attendant Type of Home: House Home Access: Stairs to enter [employer] of Steps: 1 Entrance Stairs-Rails: Right;Left Home Layout: Able to live on main level with bedroom/bathroom;1/2 bath on main level;Multi-level     Bathroom Shower/Tub: Teacher, early years/pre: Standard     Home Equipment: Conservation officer, nature (2 wheels);Cane - single point;BSC/3in1;Shower seat   Additional Comments: info taken from chart with patient fixted on wanting to leave the hositpal during session.      Prior Functioning/Environment Prior Level of Function : Independent/Modified Independent               ADLs Comments: reports she could fly home, reports she has support at home from "nadine"                 OT Goals(Current goals can be found in the care plan section) Acute Rehab OT Goals OT Goal Formulation: Patient unable to participate in goal setting  OT Frequency: Min 1X/week       AM-PAC OT "6 Clicks" Daily Activity     Outcome Measure Help from another person eating meals?: None Help from another person taking care of personal grooming?: None Help from another person toileting, which includes using toliet, bedpan, or urinal?: A Little Help from another person bathing (including washing, rinsing, drying)?: A Little Help from another person to put on and taking off regular upper body clothing?: None Help from another person to put on and taking off regular lower body clothing?: A Little 6 Click Score: 21   End of Session Equipment Utilized During Treatment: Gait belt Nurse Communication: Mobility status  Activity Tolerance: Patient tolerated treatment well Patient left: in  bed;with call bell/phone within reach;with bed alarm set  OT Visit Diagnosis: Unsteadiness on feet (R26.81)                Time: 3009-2330 OT Time Calculation (min): 14 min Charges:  OT General Charges $OT Visit: 1 Visit OT Evaluation $OT Eval Low Complexity: 1 Low  Srinivas Lippman OTR/L, MS Acute Rehabilitation Department Office# [phone]   Willa Rough 02/23/2022, 1:36 PM

## 2022-02-23 NOTE — Consult Note (Signed)
[facility] Consult   Reason for Consult:  Encephalopathy/ confusion/hallucinations Referring Physician:  Dr. Reesa Chew Patient Identification: Terri Ayala MRN:  [id number] Principal Diagnosis: Acute metabolic encephalopathy Diagnosis:  Principal Problem:   Acute metabolic encephalopathy   Total Time spent with patient: 15 minutes  Subjective:   Terri Ayala is a 66 y.o. female was seen and evaluated face-to-face by this provider.  She presents irritable and expressed frustration related to her discharge plan.  Stated " I just want to go home, no one is telling me what is going on with me."  She continues to denied suicidal or homicidal ideations.  Denies auditory visual hallucinations.  Reports she is hard of hearing and is unable to find her hearing aids.  Reports she has been medication compliant and is  requesting medication for her anxiety right now.  She denied that she is followed by psychiatry.  Does reports seeing 2 or 3 different counselor who prescribes her medications.  She is unable to recall the names of medications as she states she takes "multiple meds."  During evaluation Terri Ayala is in bed reading a new paper in no acute distress. She is alert/oriented x 3; calm/cooperative and slightly irritably but her mood congruent with affect. She is speaking in a clear tone at moderate volume, and normal pace; with good eye contact. Her thought process is coherent and relevant; There is no indication that she is currently responding to internal/external stimuli or experiencing delusional thought content; and she has denied suicidal/self-harm/homicidal ideation, psychosis, and paranoia. Patient has remained calm throughout assessment and has answered questions appropriately.    HPI: Per initial admission assessment note:"66 year old with history of GAD, chronic depression, former alcohol use, HTN, chronic back pain brought to the ED for agitation.  Patient reported of being out  of her medications and taking THC gummy and having hallucinations.  UDS was positive for benzodiazepine and THC.  UA was negative.  Lab work showed hypokalemia and slight metabolic acidosis.  Due to severe agitation, patient was started on phenobarb taper.  Psychiatry team was consulted.  CT of the head is negative.  Slowly mentation started improving.  Patient has been seen by psychiatry who recommended Ativan and thiamine."   Past Psychiatric History: see chart  Risk to Self:   Risk to Others:   Prior Inpatient Therapy:   Prior Outpatient Therapy:    Past Medical History:  Past Medical History:  Diagnosis Date   Alcohol withdrawal ([place]) 11/2017; 01/04/2018   Alcoholism ([place])    Anxiety    Arthritis    "hands" (01/04/2018)   ASYMPTOMATIC POSTMENOPAUSAL STATUS 08/17/2009   Chronic lower back pain    Depression    DYSPHAGIA 06/19/2007   Fatty liver, alcoholic    GERD (gastroesophageal reflux disease)    GOITER, MULTINODULAR 08/17/2009   Hyperlipidemia    Hypertension    Migraines    "not sure what triggers them; I'll have 1 q couple weeks or more; come 2 days in a row when they come" (01/04/2018)   Mitral valve prolapse    OSTEOPOROSIS 08/17/2009   PONV (postoperative nausea and vomiting)    Supraventricular tachycardia     Past Surgical History:  Procedure Laterality Date   BREAST BIOPSY Right    "benign"   CATARACT EXTRACTION W/ INTRAOCULAR LENS  IMPLANT, BILATERAL     CERVICAL CONE BIOPSY  [place]   "dysplasia; lesions"   CHOLECYSTECTOMY N/A 03/14/2013  Procedure: LAPAROSCOPIC CHOLECYSTECTOMY WITH ATTEMPTED INTRAOPERATIVE CHOLANGIOGRAM;  Surgeon: Harl Bowie, MD;  Location: [facility];  Service: General;  Laterality: N/A;   HARDWARE REMOVAL Left 07/22/2021   Procedure: HARDWARE REMOVAL;  Surgeon: Altamese Edgeley, MD;  Location: [facility];  Service: Orthopedics;  Laterality: Left;   laporoscopic abdominal surgery     "for endometriosis"   LEFT  HEART CATH AND CORONARY ANGIOGRAPHY N/A 05/07/2018   Procedure: LEFT HEART CATH AND CORONARY ANGIOGRAPHY;  Surgeon: Belva Crome, MD;  Location: [facility];  Service: Cardiovascular;  Laterality: N/A;   ORIF ANKLE FRACTURE Left 02/11/2021   Procedure: OPEN REDUCTION INTERNAL FIXATION (ORIF) ANKLE FRACTURE;  Surgeon: Altamese [place], MD;  Location: [facility];  Service: Orthopedics;  Laterality: Left;   TONSILLECTOMY     Family History:  Family History  Problem Relation Age of Onset   Colon cancer Father        Deceased, 54   Osteoporosis Mother        Living, 65   Healthy Brother    Healthy Brother    Healthy Son    Healthy Son    Thyroid disease Neg Hx    Goiter Neg Hx    Family Psychiatric  History:  Social History:  Social History   Substance and Sexual Activity  Alcohol Use Not Currently   Alcohol/week: 28.0 standard drinks of alcohol   Types: 28 Glasses of wine per week   Comment: I had Detox on 07/13/21 and no alcohol since then     Social History   Substance and Sexual Activity  Drug Use Never    Social History   Socioeconomic History   Marital status: Single    Spouse name: Not on file   Number of children: Not on file   Years of education: Not on file   Highest education level: Not on file  Occupational History   Occupation: Brewing technologist - retired    Fish farm manager: [employer]  Tobacco Use   Smoking status: Former    Packs/day: 1.00    Years: 7.00    Total pack years: 7.00    Types: Cigarettes    Quit date: 02/28/1974    Years since quitting: 48.0   Smokeless tobacco: Never   Tobacco comments:    01/04/2018 "smoked when I was a teenager"  Vaping Use   Vaping Use: Never used  Substance and Sexual Activity   Alcohol use: Not Currently    Alcohol/week: 28.0 standard drinks of alcohol    Types: 28 Glasses of wine per week    Comment: I had Detox on 07/13/21 and no alcohol since then   Drug use: Never   Sexual activity: Not Currently  Other  Topics Concern   Not on file  Social History Narrative   Lives alone.  She has a BS biology.   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   Social Determinants of Health   Financial Resource Strain: Not on file  Food Insecurity: Not on file  Transportation Needs: Not on file  Physical Activity: Not on file  Stress: Not on file  Social Connections: Not on file   Additional Social History:    Allergies:   Allergies  Allergen Reactions   Doxycycline Swelling    Mouth swelling and sores  Other Reaction(s): Not available   Codeine Itching    Other Reaction(s): Not available   Sulfa Antibiotics     Possibly??   Tetracycline  Hcl Swelling    Mouth swelling and sores    Labs:  Results for orders placed or performed during the hospital encounter of 02/15/22 (from the past 48 hour(s))  Comprehensive metabolic panel     Status: Abnormal   Collection Time: 02/21/22  1:08 PM  Result Value Ref Range   Sodium 140 135 - 145 mmol/L   Potassium 3.9 3.5 - 5.1 mmol/L   Chloride 103 98 - 111 mmol/L   CO2 25 22 - 32 mmol/L   Glucose, Bld 91 70 - 99 mg/dL    Comment: Glucose reference range applies only to samples taken after fasting for at least 8 hours.   BUN 9 8 - 23 mg/dL   Creatinine, Ser 0.79 0.44 - 1.00 mg/dL   Calcium 10.0 8.9 - 10.3 mg/dL   Total Protein 7.6 6.5 - 8.1 g/dL   Albumin 4.3 3.5 - 5.0 g/dL   AST 31 15 - 41 U/L   ALT 47 (H) 0 - 44 U/L   Alkaline Phosphatase 53 38 - 126 U/L   Total Bilirubin 0.7 0.3 - 1.2 mg/dL   GFR, Estimated >60 >60 mL/min    Comment: (NOTE) Calculated using the CKD-EPI Creatinine Equation (2021)    Anion gap 12 5 - 15    Comment: Performed at [facility], [place] [address], [place], [place] [address]  CBC     Status: None   Collection Time: 02/21/22  1:08 PM  Result Value Ref Range   WBC 5.5 4.0 - 10.5 K/uL   RBC 4.32 3.87 - 5.11 MIL/uL   Hemoglobin 13.6 12.0 - 15.0 g/dL   HCT 40.1 36.0 - 46.0 %    MCV 92.8 80.0 - 100.0 fL   MCH 31.5 26.0 - 34.0 pg   MCHC 33.9 30.0 - 36.0 g/dL   RDW 14.1 11.5 - 15.5 %   Platelets 230 150 - 400 K/uL   nRBC 0.0 0.0 - 0.2 %    Comment: Performed at [facility], [place] [address], [place], [place] [address]  Magnesium     Status: None   Collection Time: 02/21/22  1:08 PM  Result Value Ref Range   Magnesium 1.8 1.7 - 2.4 mg/dL    Comment: Performed at [facility], [place] [address], [place], [place] [address]  Glucose, capillary     Status: Abnormal   Collection Time: 02/21/22  5:37 PM  Result Value Ref Range   Glucose-Capillary 118 (H) 70 - 99 mg/dL    Comment: Glucose reference range applies only to samples taken after fasting for at least 8 hours.  Glucose, capillary     Status: None   Collection Time: 02/22/22 12:03 AM  Result Value Ref Range   Glucose-Capillary 97 70 - 99 mg/dL    Comment: Glucose reference range applies only to samples taken after fasting for at least 8 hours.  Glucose, capillary     Status: None   Collection Time: 02/22/22  6:28 AM  Result Value Ref Range   Glucose-Capillary 95 70 - 99 mg/dL    Comment: Glucose reference range applies only to samples taken after fasting for at least 8 hours.  Basic metabolic panel     Status: Abnormal   Collection Time: 02/22/22 11:51 AM  Result Value Ref Range   Sodium 139 135 - 145 mmol/L   Potassium 3.8 3.5 - 5.1 mmol/L   Chloride 101 98 - 111 mmol/L   CO2 25 22 -  32 mmol/L   Glucose, Bld 107 (H) 70 - 99 mg/dL    Comment: Glucose reference range applies only to samples taken after fasting for at least 8 hours.   BUN 11 8 - 23 mg/dL   Creatinine, Ser 0.87 0.44 - 1.00 mg/dL   Calcium 9.6 8.9 - 10.3 mg/dL   GFR, Estimated >60 >60 mL/min    Comment: (NOTE) Calculated using the CKD-EPI Creatinine Equation (2021)    Anion gap 13 5 - 15    Comment: Performed at [facility], [place] [address], [place], [place] [address]  CBC      Status: None   Collection Time: 02/22/22 11:51 AM  Result Value Ref Range   WBC 5.7 4.0 - 10.5 K/uL   RBC 4.38 3.87 - 5.11 MIL/uL   Hemoglobin 13.9 12.0 - 15.0 g/dL   HCT 40.8 36.0 - 46.0 %   MCV 93.2 80.0 - 100.0 fL   MCH 31.7 26.0 - 34.0 pg   MCHC 34.1 30.0 - 36.0 g/dL   RDW 14.1 11.5 - 15.5 %   Platelets 241 150 - 400 K/uL   nRBC 0.0 0.0 - 0.2 %    Comment: Performed at [facility], [place] [address], [place], [place] [address]  Magnesium     Status: None   Collection Time: 02/22/22 11:51 AM  Result Value Ref Range   Magnesium 1.7 1.7 - 2.4 mg/dL    Comment: Performed at [facility], [place] [address], [place], [place] [address]  Glucose, capillary     Status: None   Collection Time: 02/22/22  6:13 PM  Result Value Ref Range   Glucose-Capillary 86 70 - 99 mg/dL    Comment: Glucose reference range applies only to samples taken after fasting for at least 8 hours.  Glucose, capillary     Status: None   Collection Time: 02/22/22 11:27 PM  Result Value Ref Range   Glucose-Capillary 93 70 - 99 mg/dL    Comment: Glucose reference range applies only to samples taken after fasting for at least 8 hours.  Glucose, capillary     Status: Abnormal   Collection Time: 02/23/22  5:58 AM  Result Value Ref Range   Glucose-Capillary 108 (H) 70 - 99 mg/dL    Comment: Glucose reference range applies only to samples taken after fasting for at least 8 hours.  Basic metabolic panel     Status: Abnormal   Collection Time: 02/23/22  8:25 AM  Result Value Ref Range   Sodium 138 135 - 145 mmol/L   Potassium 4.1 3.5 - 5.1 mmol/L   Chloride 105 98 - 111 mmol/L   CO2 20 (L) 22 - 32 mmol/L   Glucose, Bld 84 70 - 99 mg/dL    Comment: Glucose reference range applies only to samples taken after fasting for at least 8 hours.   BUN 9 8 - 23 mg/dL   Creatinine, Ser 0.70 0.44 - 1.00 mg/dL   Calcium 9.1 8.9 - 10.3 mg/dL   GFR, Estimated >60 >60 mL/min    Comment:  (NOTE) Calculated using the CKD-EPI Creatinine Equation (2021)    Anion gap 13 5 - 15    Comment: Performed at [facility], [place] [address], [place], [place] [address]  CBC     Status: None   Collection Time: 02/23/22  8:25 AM  Result Value Ref Range   WBC 5.2 4.0 - 10.5 K/uL   RBC 4.32 3.87 -  5.11 MIL/uL   Hemoglobin 13.6 12.0 - 15.0 g/dL   HCT 40.8 36.0 - 46.0 %   MCV 94.4 80.0 - 100.0 fL   MCH 31.5 26.0 - 34.0 pg   MCHC 33.3 30.0 - 36.0 g/dL   RDW 14.0 11.5 - 15.5 %   Platelets 224 150 - 400 K/uL   nRBC 0.0 0.0 - 0.2 %    Comment: Performed at [facility], [place] [address], [place], [place] [address]  Magnesium     Status: None   Collection Time: 02/23/22  8:25 AM  Result Value Ref Range   Magnesium 2.0 1.7 - 2.4 mg/dL    Comment: Performed at [facility], [place] [address], [place],  [address]  Glucose, capillary     Status: Abnormal   Collection Time: 02/23/22 11:35 AM  Result Value Ref Range   Glucose-Capillary 103 (H) 70 - 99 mg/dL    Comment: Glucose reference range applies only to samples taken after fasting for at least 8 hours.    Current Facility-Administered Medications  Medication Dose Route Frequency Provider Last Rate Last Admin   acetaminophen (TYLENOL) tablet 650 mg  650 mg Oral Q6H PRN Amin, Ankit Chirag, MD   650 mg at 02/22/22 1111   baclofen (LIORESAL) tablet 5 mg  5 mg Oral TID Suella Broad, FNP   5 mg at 02/23/22 1055   buPROPion ER (WELLBUTRIN SR) 12 hr tablet 100 mg  100 mg Oral BID Kc, Maren [place], MD   100 mg at 02/23/22 1055   busPIRone (BUSPAR) tablet 15 mg  15 mg Oral TID Antonieta Pert, MD   15 mg at 02/23/22 1056   carvedilol (COREG) tablet 6.25 mg  6.25 mg Oral BID WC Kc, Ramesh, MD   6.25 mg at 02/23/22 1056   enoxaparin (LOVENOX) injection 40 mg  40 mg Subcutaneous Q24H Hall, Carole N, DO   40 mg at 02/23/22 1100   fluticasone (FLONASE) 50 MCG/ACT nasal spray 2 spray  2 spray Each  Nare Daily PRN Kc, Maren [place], MD       folic acid (FOLVITE) tablet 1 mg  1 mg Oral Daily Desai, Rahul P, PA-C   1 mg at 02/23/22 1056   guaiFENesin (ROBITUSSIN) 100 MG/5ML liquid 5 mL  5 mL Oral Q4H PRN Amin, Ankit Chirag, MD   5 mL at 02/21/22 1435   hydrALAZINE (APRESOLINE) injection 10 mg  10 mg Intravenous Q4H PRN Amin, Ankit Chirag, MD   10 mg at 02/17/22 1637   hydrocortisone cream 1 % 1 Application  1 Application Topical TID PRN Damita Lack, MD   1 Application at 67/61/95 2220   ipratropium-albuterol (DUONEB) 0.5-2.5 (3) MG/3ML nebulizer solution 3 mL  3 mL Nebulization Q4H PRN Amin, Ankit Chirag, MD       LORazepam (ATIVAN) tablet 1 mg  1 mg Oral Q6H PRN Amin, Ankit Chirag, MD   1 mg at 02/21/22 2146   LORazepam (ATIVAN) tablet 2 mg  2 mg Oral TID Suella Broad, FNP   2 mg at 02/23/22 1056   melatonin tablet 6 mg  6 mg Oral QHS PRN Raenette Rover, NP   6 mg at 02/22/22 2222   metoprolol tartrate (LOPRESSOR) injection 5 mg  5 mg Intravenous Q4H PRN Amin, Jeanella Flattery, MD       multivitamin with minerals tablet 1 tablet  1 tablet Oral Daily Desai, Rahul P, PA-C   1 tablet at 02/23/22 1056  ondansetron (ZOFRAN) tablet 4 mg  4 mg Oral Q6H PRN Minda Ditto, RPH   4 mg at 02/20/22 0827   Oral care mouth rinse  15 mL Mouth Rinse PRN Irene Pap N, DO       polyethylene glycol (MIRALAX / GLYCOLAX) packet 17 g  17 g Oral Daily PRN Irene Pap N, DO       prochlorperazine (COMPAZINE) injection 5 mg  5 mg Intravenous Q6H PRN Irene Pap N, DO   5 mg at 02/16/22 0557   senna-docusate (Senokot-S) tablet 1 tablet  1 tablet Oral QHS PRN Damita Lack, MD          Psychiatric Specialty Exam:  Presentation  General Appearance:  Appropriate for Environment  Eye Contact: Good  Speech: Clear and Coherent  Speech Volume: Normal  Handedness: Right   Mood and Affect  Mood: Anxious; Irritable  Affect: Congruent; Appropriate   Thought Process  Thought  Processes: Coherent  Descriptions of Associations:Intact  Orientation:Full (Time, Place and Person)  Thought Content:Logical  History of Schizophrenia/Schizoaffective disorder:No data recorded Duration of Psychotic Symptoms:No data recorded Hallucinations:Hallucinations: None  Ideas of Reference:None  Suicidal Thoughts:Suicidal Thoughts: No  Homicidal Thoughts:Homicidal Thoughts: No   Sensorium  Memory: Immediate Fair; Recent Fair; Remote Fair  Judgment: Fair  Insight: Fair   Community education officer  Concentration: Fair  Attention Span: Fair  Recall: [place] of Knowledge: Fair  Language: Good   Psychomotor Activity  Psychomotor Activity:Psychomotor Activity: Normal   Assets  Assets: Desire for Improvement; Social Support   Sleep  Sleep:Sleep: Fair   Physical Exam: Physical Exam ROS Blood pressure 131/80, pulse 78, temperature 98.2 F (36.8 C), temperature source Oral, resp. rate 16, height '5\' 2"'$  (1.575 m), weight 56.3 kg, SpO2 100 %. Body mass index is 22.7 kg/m.  Treatment Plan Summary: Daily contact with patient to assess and evaluate symptoms and progress in treatment and Medication management  As noted by previous provider, psychiatry will continue to follow during this inpatient admission -Continue Ativan challenge taper -Continue thiamine 500 mg  -Consider restarting baclofen 5 mg p.o. twice daily -Continue delirium precaution   Disposition: No evidence of imminent risk to self or others at present.   Patient does not meet criteria for psychiatric inpatient admission. Supportive therapy provided about ongoing stressors. Refer to IOP. Discussed crisis plan, support from social network, calling 911, coming to the Emergency Department, and calling Suicide Hotline.  Derrill [place], NP 02/23/2022 12:42 PM

## 2022-02-23 NOTE — Progress Notes (Signed)
PROGRESS NOTE    Terri Ayala  LNL:[id number] DOB: Jul 22, 1955 DOA: 02/15/2022 PCP: Lorrene Reid, PA-C   Brief Narrative:  66 year old with history of GAD, chronic depression, former alcohol use, HTN, chronic back pain brought to the ED for agitation.  Patient reported of being out of her medications and taking THC gummy and having hallucinations.  UDS was positive for benzodiazepine and THC.  UA was negative.  Lab work showed hypokalemia and slight metabolic acidosis.  Due to severe agitation, patient was started on phenobarb taper.  Psychiatry team was consulted.  CT of the head is negative.    Assessment & Plan:   Acute toxic encephalopathy with agitation hallucination Extensive history of polysubstance abuse UDS positive for THC and benzos GAD/depression: Likely polypharmacy or withdrawal from polysubstance abuse.  No obvious signs of infection, completed course of phenobarbital.  Mentation slowly improving. TSH, folate, B12-normal.  No evidence of urinary retention Seen by psychiatry.  Patient received 2 days of 500 mg intramuscularly.  Will switch her to oral thiamine today. Psychiatry also has her on Ativan.   Baclofen has been resumed.   Stable for the most part.  Management per psychiatry.  Hypokalemia  Improved   Mild metabolic acidosis  Resolved  Transaminitis  Stable   Mild leukocytosis  Resolved  Severe anxiety disorder  Seen by psychiatry-currently on scheduled Ativan  Alcohol abuse No evidence for withdrawal.  Was on CIWA protocol.  DVT prophylaxis: Lovenox Code Status: Full code Family Communication: No family at bedside Disposition: Probable discharge back home when cleared by psychiatry.   Subjective: Patient lying on the bed.  In no distress.  Denies any complaints.   Objective: Vitals:   02/22/22 0630 02/22/22 1428 02/22/22 2013 02/23/22 0601  BP: 129/73 (!) 147/79 (!) 141/80 131/80  Pulse: 87 82 78 78  Resp: '16 17 16 16  '$ Temp: 98.3 F  (36.8 C)  98 F (36.7 C) 98.2 F (36.8 C)  TempSrc: Oral  Oral Oral  SpO2: 95% 100% 100% 100%  Weight:      Height:        Intake/Output Summary (Last 24 hours) at 02/23/2022 1327 Last data filed at 02/23/2022 1000 Gross per 24 hour  Intake 360 ml  Output --  Net 360 ml    Filed Weights   02/17/22 0306  Weight: 56.3 kg   Examination:  General appearance: Awake alert.  In no distress Resp: Clear to auscultation bilaterally.  Normal effort Cardio: S1-S2 is normal regular.  No S3-S4.  No rubs murmurs or bruit GI: Abdomen is soft.  Nontender nondistended.  Bowel sounds are present normal.  No masses organomegaly Extremities: No edema.    Data Reviewed:   CBC: Recent Labs  Lab 02/19/22 1216 02/20/22 0755 02/21/22 1308 02/22/22 1151 02/23/22 0825  WBC 4.9 5.7 5.5 5.7 5.2  HGB 13.3 12.8 13.6 13.9 13.6  HCT 39.7 38.0 40.1 40.8 40.8  MCV 95.2 92.9 92.8 93.2 94.4  PLT 157 215 230 241 408    Basic Metabolic Panel: Recent Labs  Lab 02/17/22 0706 02/18/22 0311 02/19/22 1216 02/20/22 0755 02/21/22 1308 02/22/22 1151 02/23/22 0825  NA 138   < > 137 137 140 139 138  K 3.7   < > 4.2 3.4* 3.9 3.8 4.1  CL 108   < > 107 105 103 101 105  CO2 21*   < > 19* 20* 25 25 20*  GLUCOSE 101*   < > 93 94 91 107* 84  BUN 6*   < > 5* 6* '9 11 9  '$ CREATININE 0.61   < > 0.62 0.69 0.79 0.87 0.70  CALCIUM 8.4*   < > 8.8* 9.1 10.0 9.6 9.1  MG 1.8   < > 2.0 1.7 1.8 1.7 2.0  PHOS 2.7  --   --   --   --   --   --    < > = values in this interval not displayed.    GFR: Estimated Creatinine Clearance: 54 mL/min (by C-G formula based on SCr of 0.7 mg/dL).  Liver Function Tests: Recent Labs  Lab 02/17/22 0706 02/18/22 0311 02/19/22 1216 02/20/22 0755 02/21/22 1308  AST 77* 61* 60* 36 31  ALT 98* 87* 69* 58* 47*  ALKPHOS 39 40 47 45 53  BILITOT 0.8 1.0 1.5* 0.5 0.7  PROT 6.0* 6.2* 7.1 7.0 7.6  ALBUMIN 3.5 3.4* 3.9 4.0 4.3     CBG: Recent Labs  Lab 02/22/22 0628  02/22/22 1813 02/22/22 2327 02/23/22 0558 02/23/22 1135  GLUCAP 95 86 93 108* 103*      Recent Results (from the past 240 hour(s))  MRSA Next Gen by PCR, Nasal     Status: None   Collection Time: 02/15/22 11:43 PM   Specimen: Nasal Mucosa; Nasal Swab  Result Value Ref Range Status   MRSA by PCR Next Gen NOT DETECTED NOT DETECTED Final    Comment: (NOTE) The GeneXpert MRSA Assay (FDA approved for NASAL specimens only), is one component of a comprehensive MRSA colonization surveillance program. It is not intended to diagnose MRSA infection nor to guide or monitor treatment for MRSA infections. Test performance is not FDA approved in patients less than 55 years old. Performed at [facility], [place] [address], [place], [place] [address]          Radiology Studies: No results found.  Scheduled Meds:  baclofen  5 mg Oral TID   buPROPion ER  100 mg Oral BID   busPIRone  15 mg Oral TID   carvedilol  6.25 mg Oral BID WC   enoxaparin (LOVENOX) injection  40 mg Subcutaneous T01X   folic acid  1 mg Oral Daily   LORazepam  2 mg Oral TID   multivitamin with minerals  1 tablet Oral Daily   Continuous Infusions:     LOS: 8 days     Bonnielee Haff, MD [facility]  If 7PM-7AM, please contact night-coverage  02/23/2022, 1:27 PM

## 2022-02-23 NOTE — Progress Notes (Signed)
OT Cancellation Note  Patient Details Name: EMORI MUMME MRN: [id number] DOB: Dec 28, 1955   Cancelled Treatment:    Reason Eval/Treat Not Completed: Fatigue/lethargy limiting ability to participate Patient is in deep sleep this AM. OT to continue to follow and check back as schedule will allow.  Rennie Plowman, MS Acute Rehabilitation Department Office# [phone]  Willa Rough 02/23/2022, 9:42 AM

## 2022-02-24 DIAGNOSIS — R410 Disorientation, unspecified: Secondary | ICD-10-CM

## 2022-02-24 DIAGNOSIS — G9341 Metabolic encephalopathy: Secondary | ICD-10-CM | POA: Diagnosis not present

## 2022-02-24 DIAGNOSIS — R443 Hallucinations, unspecified: Secondary | ICD-10-CM | POA: Diagnosis not present

## 2022-02-24 LAB — GLUCOSE, CAPILLARY
Glucose-Capillary: 106 mg/dL — ABNORMAL HIGH (ref 70–99)
Glucose-Capillary: 115 mg/dL — ABNORMAL HIGH (ref 70–99)
Glucose-Capillary: 94 mg/dL (ref 70–99)
Glucose-Capillary: 97 mg/dL (ref 70–99)

## 2022-02-24 LAB — MAGNESIUM: Magnesium: 2 mg/dL (ref 1.7–2.4)

## 2022-02-24 MED ORDER — POLYETHYLENE GLYCOL 3350 17 G PO PACK
17.0000 g | PACK | Freq: Every day | ORAL | Status: DC
  Administered 2022-02-24: 17 g via ORAL
  Filled 2022-02-24 (×2): qty 1

## 2022-02-24 MED ORDER — SENNOSIDES-DOCUSATE SODIUM 8.6-50 MG PO TABS
2.0000 | ORAL_TABLET | Freq: Two times a day (BID) | ORAL | Status: DC
  Administered 2022-02-24 (×2): 2 via ORAL
  Filled 2022-02-24 (×3): qty 2

## 2022-02-24 MED ORDER — VITAMIN B-1 100 MG PO TABS: 100.0000 mg | ORAL_TABLET | Freq: Every day | ORAL | 0 refills | Status: DC

## 2022-02-24 MED ORDER — BACLOFEN 5 MG PO TABS: 5.0000 mg | ORAL_TABLET | Freq: Two times a day (BID) | ORAL | 0 refills | Status: DC

## 2022-02-24 NOTE — Care Management Important Message (Signed)
Important Message  Patient Details IM Letter given. Name: Terri Ayala MRN: [id number] Date of Birth: [birth year]   Medicare Important Message Given:  Yes     Kerin Salen 02/24/2022, 9:43 AM

## 2022-02-24 NOTE — Discharge Summary (Signed)
[facility]  Physician Discharge Summary   Patient ID: Terri Ayala MRN: [id number] DOB/AGE: 04-26-1955 66 y.o.  Admit date: 02/15/2022 Discharge date:   02/25/2022   PCP: Lorrene Reid, PA-C  DISCHARGE DIAGNOSES:  Principal Problem:   Acute metabolic encephalopathy   RECOMMENDATIONS FOR OUTPATIENT FOLLOW UP: Instructed to follow-up with her primary care provider.  Other follow-up instructions as per psychiatry   Home Health: None Equipment/Devices: None  CODE STATUS: Full code  DISCHARGE CONDITION: fair  Diet recommendation: As before  INITIAL HISTORY: 66 year old with history of GAD, chronic depression, former alcohol use, HTN, chronic back pain brought to the ED for agitation. Patient reported of being out of her medications and taking THC gummy and having hallucinations. UDS was positive for benzodiazepine and THC. UA was negative. Lab work showed hypokalemia and slight metabolic acidosis. Due to severe agitation, patient was started on phenobarb taper. Psychiatry team was consulted. CT of the head is negative.   Consultations: Patient was seen by psychiatry   HOSPITAL COURSE:   Acute toxic encephalopathy with agitation hallucination Extensive history of polysubstance abuse UDS positive for THC and benzos GAD/depression: Likely polypharmacy or withdrawal from polysubstance abuse.  No obvious signs of infection, completed course of phenobarbital.  Mentation has improved. TSH, folate, B12-normal.  No evidence of urinary retention Seen by psychiatry.  Patient received 2 days of thiamine 500 mg intramuscularly.  Now on oral thiamine. She was also treated with Ativan with which she had significant improvement.  Per psychiatry no need to continue Ativan.  They do recommend resuming baclofen and giving it twice a day.  Cleared by psychiatry for discharge.  Okay for discharge.   Hypokalemia  Improved   Mild metabolic acidosis  Resolved   Transaminitis   Stable   Mild leukocytosis  Resolved   Severe anxiety disorder  Seen by psychiatry-currently on scheduled Ativan   Alcohol abuse No evidence for withdrawal.  Was on CIWA protocol.   Patient is stable for discharge to home.   PERTINENT LABS:  The results of significant diagnostics from this hospitalization (including imaging, microbiology, ancillary and laboratory) are listed below for reference.    Microbiology: Recent Results (from the past 240 hour(s))  MRSA Next Gen by PCR, Nasal     Status: None   Collection Time: 02/15/22 11:43 PM   Specimen: Nasal Mucosa; Nasal Swab  Result Value Ref Range Status   MRSA by PCR Next Gen NOT DETECTED NOT DETECTED Final    Comment: (NOTE) The GeneXpert MRSA Assay (FDA approved for NASAL specimens only), is one component of a comprehensive MRSA colonization surveillance program. It is not intended to diagnose MRSA infection nor to guide or monitor treatment for MRSA infections. Test performance is not FDA approved in patients less than 67 years old. Performed at [facility], [place] [address], [place], [place] [address]      Labs:   Basic Metabolic Panel: Recent Labs  Lab 02/19/22 1216 02/20/22 0755 02/21/22 1308 02/22/22 1151 02/23/22 0825 02/24/22 0814  NA 137 137 140 139 138  --   K 4.2 3.4* 3.9 3.8 4.1  --   CL 107 105 103 101 105  --   CO2 19* 20* 25 25 20*  --   GLUCOSE 93 94 91 107* 84  --   BUN 5* 6* '9 11 9  '$ --   CREATININE 0.62 0.69 0.79 0.87 0.70  --   CALCIUM 8.8* 9.1 10.0 9.6 9.1  --  MG 2.0 1.7 1.8 1.7 2.0 2.0   Liver Function Tests: Recent Labs  Lab 02/19/22 1216 02/20/22 0755 02/21/22 1308  AST 60* 36 31  ALT 69* 58* 47*  ALKPHOS 47 45 53  BILITOT 1.5* 0.5 0.7  PROT 7.1 7.0 7.6  ALBUMIN 3.9 4.0 4.3    CBC: Recent Labs  Lab 02/19/22 1216 02/20/22 0755 02/21/22 1308 02/22/22 1151 02/23/22 0825  WBC 4.9 5.7 5.5 5.7 5.2  HGB 13.3 12.8 13.6 13.9 13.6  HCT 39.7 38.0 40.1  40.8 40.8  MCV 95.2 92.9 92.8 93.2 94.4  PLT 157 215 230 241 224    CBG: Recent Labs  Lab 02/23/22 2359 02/24/22 0606 02/24/22 1139 02/24/22 1747 02/25/22 0541  GLUCAP 97 106* 115* 94 105*     IMAGING STUDIES CT HEAD WO CONTRAST (5MM)  Result Date: 02/17/2022 CLINICAL DATA:  Altered mental status EXAM: CT HEAD WITHOUT CONTRAST TECHNIQUE: Contiguous axial images were obtained from the base of the skull through the vertex without intravenous contrast. RADIATION DOSE REDUCTION: This exam was performed according to the departmental dose-optimization program which includes automated exposure control, adjustment of the mA and/or kV according to patient size and/or use of iterative reconstruction technique. COMPARISON:  CT head 01/25/2022 FINDINGS: Brain: There is no acute intracranial hemorrhage, extra-axial fluid collection, or acute infarct Parenchymal volume is stable. The ventricles are stable in size. Gray-white differentiation is preserved. Small calcification in the left cerebellar hemisphere is unchanged. There is no mass lesion.  There is no mass effect or midline shift. Vascular: No hyperdense vessel or unexpected calcification. Skull: Normal. Negative for fracture or focal lesion. Sinuses/Orbits: The paranasal sinuses are essentially clear. Bilateral lens implants are in place. The globes and orbits are otherwise unremarkable. Other: None. IMPRESSION: No acute intracranial pathology. Electronically Signed   By: Valetta Mole M.D.   On: 02/17/2022 14:42    DISCHARGE EXAMINATION: Vitals:   02/24/22 0605 02/24/22 1341 02/24/22 1957 02/25/22 0539  BP: (!) 143/72 139/61 (!) 140/71 120/83  Pulse: 81 80 88 79  Resp: '16 17 16 16  '$ Temp: 98 F (36.7 C) (!) 97.5 F (36.4 C) 98.8 F (37.1 C) (!) 97.5 F (36.4 C)  TempSrc: Oral Oral Oral Oral  SpO2: 98% 97% 99% 99%  Weight:      Height:       General appearance: Awake alert.  In no distress Resp: Clear to auscultation bilaterally.   Normal effort Cardio: S1-S2 is normal regular.  No S3-S4.  No rubs murmurs or bruit GI: Abdomen is soft.  Nontender nondistended.  Bowel sounds are present normal.  No masses organomegaly   DISPOSITION: Home  Discharge Instructions     Call MD for:  extreme fatigue   Complete by: As directed    Call MD for:  persistant dizziness or light-headedness   Complete by: As directed    Call MD for:  persistant nausea and vomiting   Complete by: As directed    Call MD for:  severe uncontrolled pain   Complete by: As directed    Call MD for:  temperature >100.4   Complete by: As directed    Diet - low sodium heart healthy   Complete by: As directed    Discharge instructions   Complete by: As directed    Take your medications as prescribed. Follow instructions provided by the psychiatrist. Follow up with your PCP>  You were cared for by a hospitalist during your hospital stay. If you have any  questions about your discharge medications or the care you received while you were in the hospital after you are discharged, you can call the unit and asked to speak with the hospitalist on call if the hospitalist that took care of you is not available. Once you are discharged, your primary care physician will handle any further medical issues. Please note that NO REFILLS for any discharge medications will be authorized once you are discharged, as it is imperative that you return to your primary care physician (or establish a relationship with a primary care physician if you do not have one) for your aftercare needs so that they can reassess your need for medications and monitor your lab values. If you do not have a primary care physician, you can call [phone] for a physician referral.   Increase activity slowly   Complete by: As directed          Allergies as of 02/25/2022       Reactions   Doxycycline Swelling   Mouth swelling and sores Other Reaction(s): Not available   Codeine Itching   Other  Reaction(s): Not available   Sulfa Antibiotics    Possibly??   Tetracycline Hcl Swelling   Mouth swelling and sores        Medication List     TAKE these medications    acetaminophen 500 MG tablet Commonly known as: TYLENOL Take 500 mg by mouth every 8 (eight) hours as needed.   ADULT GUMMY PO Take 2 capsules by mouth daily.   albuterol 108 (90 Base) MCG/ACT inhaler Commonly known as: VENTOLIN HFA INHALE 2 PUFFS INTO THE LUNGS EVERY 6 HOURS AS NEEDED FOR WHEEZING OR SHORTNESS OF BREATH.   alendronate 70 MG tablet Commonly known as: FOSAMAX Take 70 mg by mouth every Monday.   ampicillin 500 MG capsule Commonly known as: PRINCIPEN Take 500 mg by mouth daily.   Azelaic Acid 15 % gel   Baclofen 5 MG Tabs Take 5 mg by mouth 2 (two) times daily.   buPROPion ER 100 MG 12 hr tablet Commonly known as: Wellbutrin SR Take 1 tablet (100 mg total) by mouth 2 (two) times daily.   busPIRone 15 MG tablet Commonly known as: BUSPAR Take 1 tablet (15 mg total) by mouth 3 (three) times daily.   CALCIUM 600+D PO Take 1 tablet by mouth daily.   carvedilol 6.25 MG tablet Commonly known as: COREG Take 1 tablet (6.25 mg total) by mouth 2 (two) times daily with a meal.   Clenpiq 10-3.5-12 MG-GM -GM/175ML Soln Generic drug: Sod Picosulfate-Mag Ox-Cit Acd SMARTSIG:175 Milliliter(s) By Mouth Twice a Week   clindamycin 1 % gel Commonly known as: CLINDAGEL   DERMAREST ROSACEA EX Apply topically Once daily for 30   diclofenac Sodium 1 % Gel Commonly known as: VOLTAREN Apply 2 g 4 times a day by topical route.   Flovent HFA 110 MCG/ACT inhaler Generic drug: fluticasone 2 puff(s) Puff and swallow/ not for asthma, for EOE 2 times a day for 30 day(s)   fluocinonide 0.05 % external solution Commonly known as: LIDEX   fluticasone 50 MCG/ACT nasal spray Commonly known as: FLONASE Place 2 sprays into both nostrils daily as needed for allergies.   folic acid 1 MG tablet Commonly  known as: FOLVITE TAKE 1 TABLET BY MOUTH DAILY.   gabapentin 300 MG capsule Commonly known as: NEURONTIN Take 1 capsule twice a day by oral route.   hydrocortisone 2.5 % cream hydrocortisone 2.5 % topical cream  APPLY TO AFFECTED AREA TWICE A DAY FOR 14 DAYS   ketoconazole 2 % shampoo Commonly known as: NIZORAL APPLY 5 MLS TO AFFECTED AREA 2 TO 3 TIMES A WEEK for 30   meloxicam 15 MG tablet Commonly known as: MOBIC Take 1 tablet (15 mg total) by mouth daily as needed for pain.   norethindrone-ethinyl estradiol 1-5 MG-MCG Tabs tablet Commonly known as: FEMHRT 1/5 Take 1 tablet by mouth daily.   ondansetron 8 MG disintegrating tablet Commonly known as: ZOFRAN-ODT   pantoprazole 40 MG tablet Commonly known as: PROTONIX TAKE 1 TABLET BY MOUTH 2 TIMES DAILY.   SUMAtriptan 100 MG tablet Commonly known as: Imitrex Take 1 tablet (100 mg total) by mouth every 2 (two) hours as needed for migraine. May repeat in 2 hours if headache persists or recurs.   thiamine 100 MG tablet Commonly known as: Vitamin B-1 Take 1 tablet (100 mg total) by mouth daily. What changed: See the new instructions.   traZODone 50 MG tablet Commonly known as: DESYREL Take 50 mg by mouth at bedtime.   triamcinolone cream 0.1 % Commonly known as: KENALOG APPLY TO AFFECTED AREA TWICE A DAY          Follow-up Information     Lorrene Reid, PA-C. Schedule an appointment as soon as possible for a visit in 1 week(s).   Specialty: Physician Assistant Why: post hospitalization follow up Contact information: [place] [address] [phone]         [facility]. Call in 1 day(s).   Specialty: Behavioral Health Why: Make an appointment to be seen by gero psychiatrist for pharmacology testing and medication management. Contact information: [address] [place] [phone]                 TOTAL DISCHARGE TIME: 35 minutes  [facility] Pager on [email or web address]  02/25/2022, 10:30 AM

## 2022-02-24 NOTE — Progress Notes (Signed)
PROGRESS NOTE    Terri Ayala  EHU:[id number] DOB: 05-16-1955 DOA: 02/15/2022 PCP: Lorrene Reid, PA-C   Brief Narrative:  66 year old with history of GAD, chronic depression, former alcohol use, HTN, chronic back pain brought to the ED for agitation.  Patient reported of being out of her medications and taking THC gummy and having hallucinations.  UDS was positive for benzodiazepine and THC.  UA was negative.  Lab work showed hypokalemia and slight metabolic acidosis.  Due to severe agitation, patient was started on phenobarb taper.  Psychiatry team was consulted.  CT of the head is negative.    Assessment & Plan:   Acute toxic encephalopathy with agitation hallucination Extensive history of polysubstance abuse UDS positive for THC and benzos GAD/depression: Likely polypharmacy or withdrawal from polysubstance abuse.  No obvious signs of infection, completed course of phenobarbital.  Mentation slowly improving. TSH, folate, B12-normal.  No evidence of urinary retention Seen by psychiatry.  Patient received 2 days of thiamine 500 mg intramuscularly.  Now on oral thiamine. Psychiatry also has her on Ativan.   Baclofen has been resumed.   Stable for the most part.  Management per psychiatry.  Hypokalemia  Improved   Mild metabolic acidosis  Resolved  Transaminitis  Stable   Mild leukocytosis  Resolved  Severe anxiety disorder  Seen by psychiatry-currently on scheduled Ativan  Alcohol abuse No evidence for withdrawal.  Was on CIWA protocol.  DVT prophylaxis: Lovenox Code Status: Full code Family Communication: No family at bedside Disposition: Probable discharge back home when cleared by psychiatry.   Subjective: Patient denies any complaints.  Wants to go home.   Objective: Vitals:   02/23/22 0601 02/23/22 1355 02/23/22 2047 02/24/22 0605  BP: 131/80 119/73 128/76 (!) 143/72  Pulse: 78 85 87 81  Resp: '16 14 16 16  '$ Temp: 98.2 F (36.8 C) 98.2 F (36.8 C) 98.2  F (36.8 C) 98 F (36.7 C)  TempSrc: Oral Oral Oral Oral  SpO2: 100% 99% 99% 98%  Weight:      Height:        Intake/Output Summary (Last 24 hours) at 02/24/2022 1030 Last data filed at 02/23/2022 1754 Gross per 24 hour  Intake 240 ml  Output --  Net 240 ml    Filed Weights   02/17/22 0306  Weight: 56.3 kg   Examination:  General appearance: Awake alert.  In no distress Resp: Clear to auscultation bilaterally.  Normal effort Cardio: S1-S2 is normal regular.  No S3-S4.  No rubs murmurs or bruit GI: Abdomen is soft.  Nontender nondistended.  Bowel sounds are present normal.  No masses organomegaly Extremities: No edema.   Data Reviewed:   CBC: Recent Labs  Lab 02/19/22 1216 02/20/22 0755 02/21/22 1308 02/22/22 1151 02/23/22 0825  WBC 4.9 5.7 5.5 5.7 5.2  HGB 13.3 12.8 13.6 13.9 13.6  HCT 39.7 38.0 40.1 40.8 40.8  MCV 95.2 92.9 92.8 93.2 94.4  PLT 157 215 230 241 785    Basic Metabolic Panel: Recent Labs  Lab 02/19/22 1216 02/20/22 0755 02/21/22 1308 02/22/22 1151 02/23/22 0825 02/24/22 0814  NA 137 137 140 139 138  --   K 4.2 3.4* 3.9 3.8 4.1  --   CL 107 105 103 101 105  --   CO2 19* 20* 25 25 20*  --   GLUCOSE 93 94 91 107* 84  --   BUN 5* 6* '9 11 9  '$ --   CREATININE 0.62 0.69 0.79 0.87 0.70  --  CALCIUM 8.8* 9.1 10.0 9.6 9.1  --   MG 2.0 1.7 1.8 1.7 2.0 2.0    GFR: Estimated Creatinine Clearance: 54 mL/min (by C-G formula based on SCr of 0.7 mg/dL).  Liver Function Tests: Recent Labs  Lab 02/18/22 0311 02/19/22 1216 02/20/22 0755 02/21/22 1308  AST 61* 60* 36 31  ALT 87* 69* 58* 47*  ALKPHOS 40 47 45 53  BILITOT 1.0 1.5* 0.5 0.7  PROT 6.2* 7.1 7.0 7.6  ALBUMIN 3.4* 3.9 4.0 4.3     CBG: Recent Labs  Lab 02/23/22 0558 02/23/22 1135 02/23/22 1748 02/23/22 2359 02/24/22 0606  GLUCAP 108* 103* 104* 97 106*      Recent Results (from the past 240 hour(s))  MRSA Next Gen by PCR, Nasal     Status: None   Collection Time:  02/15/22 11:43 PM   Specimen: Nasal Mucosa; Nasal Swab  Result Value Ref Range Status   MRSA by PCR Next Gen NOT DETECTED NOT DETECTED Final    Comment: (NOTE) The GeneXpert MRSA Assay (FDA approved for NASAL specimens only), is one component of a comprehensive MRSA colonization surveillance program. It is not intended to diagnose MRSA infection nor to guide or monitor treatment for MRSA infections. Test performance is not FDA approved in patients less than 83 years old. Performed at [facility], [place] [address], [place],  [address]          Radiology Studies: No results found.  Scheduled Meds:  baclofen  5 mg Oral TID   buPROPion ER  100 mg Oral BID   busPIRone  15 mg Oral TID   carvedilol  6.25 mg Oral BID WC   enoxaparin (LOVENOX) injection  40 mg Subcutaneous Q24H   feeding supplement  237 mL Oral BID BM   folic acid  1 mg Oral Daily   LORazepam  2 mg Oral TID   multivitamin with minerals  1 tablet Oral Daily   thiamine  100 mg Oral Daily   Continuous Infusions:     LOS: 9 days     Bonnielee Haff, MD [facility]  If 7PM-7AM, please contact night-coverage  02/24/2022, 10:30 AM

## 2022-02-24 NOTE — Consult Note (Addendum)
[facility] Consult   Reason for Consult:  Encephalopathy/ confusion/hallucinations Referring Physician:  Dr. Reesa Chew Patient Identification: Terri Ayala MRN:  [id number] Principal Diagnosis: Acute metabolic encephalopathy Diagnosis:  Principal Problem:   Acute metabolic encephalopathy   Total Time spent with patient: 15 minutes  Subjective:   Terri Ayala is a 66 y.o. female was seen and evaluated face-to-face by this provider.  Today, she is calm, cooperative, denies psychosis, delusions, suicidal/homicidal ideations, intent or plan. Will psych clear at this time. She does not appear to be acutely psychotic and stabilization has been obtained through the inpatient by means of medication adjustment, crisis stabilization and daily re-evaluations.     On today's reassessment patient did not present with any focal neurological deficit, she was able to speak clearly, follow commands, and denies weakness.   At this current time, patient's pre-existing condition that was substantially aggravated prior to this admission has returned to previous level.  Patient further states that her current health at this present time seems to be normal as she is noted to feel better and like her self again.  She does deny  mental fog, overall assessing is doing better.  She does not present with any decrease in attention or concentration, she is alert and oriented x 4, shows no short-term memory deficit. She is not lethargic on today's exam, shows linear thought processes, and answers all questions appropriately.  At this time it is felt that patient has returned to psychiatric baseline, and will be stable to discharge home. SHe denies suicidal ideation, homicidal ideation, and or auditory or visual hallucinations   HPI: Per initial admission assessment note:"66 year old with history of GAD, chronic depression, former alcohol use, HTN, chronic back pain brought to the ED for agitation.  Patient reported  of being out of her medications and taking THC gummy and having hallucinations.  UDS was positive for benzodiazepine and THC.  UA was negative.  Lab work showed hypokalemia and slight metabolic acidosis.  Due to severe agitation, patient was started on phenobarb taper.  Psychiatry team was consulted.  CT of the head is negative.  Slowly mentation started improving.  Patient has been seen by psychiatry who recommended Ativan and thiamine."   Past Psychiatric History: see chart  Risk to Self:   Risk to Others:   Prior Inpatient Therapy:   Prior Outpatient Therapy:    Past Medical History:  Past Medical History:  Diagnosis Date   Alcohol withdrawal ([place]) 11/2017; 01/04/2018   Alcoholism ([place])    Anxiety    Arthritis    "hands" (01/04/2018)   ASYMPTOMATIC POSTMENOPAUSAL STATUS 08/17/2009   Chronic lower back pain    Depression    DYSPHAGIA 06/19/2007   Fatty liver, alcoholic    GERD (gastroesophageal reflux disease)    GOITER, MULTINODULAR 08/17/2009   Hyperlipidemia    Hypertension    Migraines    "not sure what triggers them; I'll have 1 q couple weeks or more; come 2 days in a row when they come" (01/04/2018)   Mitral valve prolapse    OSTEOPOROSIS 08/17/2009   PONV (postoperative nausea and vomiting)    Supraventricular tachycardia     Past Surgical History:  Procedure Laterality Date   BREAST BIOPSY Right    "benign"   CATARACT EXTRACTION W/ INTRAOCULAR LENS  IMPLANT, BILATERAL     CERVICAL CONE BIOPSY  [place]   "dysplasia; lesions"   CHOLECYSTECTOMY N/A 03/14/2013   Procedure:  LAPAROSCOPIC CHOLECYSTECTOMY WITH ATTEMPTED INTRAOPERATIVE CHOLANGIOGRAM;  Surgeon: Harl Bowie, MD;  Location: [facility];  Service: General;  Laterality: N/A;   HARDWARE REMOVAL Left 07/22/2021   Procedure: HARDWARE REMOVAL;  Surgeon: Altamese Harrison, MD;  Location: [facility];  Service: Orthopedics;  Laterality: Left;   laporoscopic abdominal surgery     "for  endometriosis"   LEFT HEART CATH AND CORONARY ANGIOGRAPHY N/A 05/07/2018   Procedure: LEFT HEART CATH AND CORONARY ANGIOGRAPHY;  Surgeon: Belva Crome, MD;  Location: [facility];  Service: Cardiovascular;  Laterality: N/A;   ORIF ANKLE FRACTURE Left 02/11/2021   Procedure: OPEN REDUCTION INTERNAL FIXATION (ORIF) ANKLE FRACTURE;  Surgeon: Altamese Durand, MD;  Location: [facility];  Service: Orthopedics;  Laterality: Left;   TONSILLECTOMY     Family History:  Family History  Problem Relation Age of Onset   Ayala cancer Father        Deceased, 60   Osteoporosis Mother        Living, 58   Healthy Brother    Healthy Brother    Healthy Son    Healthy Son    Thyroid disease Neg Hx    Goiter Neg Hx    Family Psychiatric  History:  Social History:  Social History   Substance and Sexual Activity  Alcohol Use Not Currently   Alcohol/week: 28.0 standard drinks of alcohol   Types: 28 Glasses of wine per week   Comment: I had Detox on 07/13/21 and no alcohol since then     Social History   Substance and Sexual Activity  Drug Use Never    Social History   Socioeconomic History   Marital status: Single    Spouse name: Not on file   Number of children: Not on file   Years of education: Not on file   Highest education level: Not on file  Occupational History   Occupation: Brewing technologist - retired    Fish farm manager: [employer]  Tobacco Use   Smoking status: Former    Packs/day: 1.00    Years: 7.00    Total pack years: 7.00    Types: Cigarettes    Quit date: 02/28/1974    Years since quitting: 48.0   Smokeless tobacco: Never   Tobacco comments:    01/04/2018 "smoked when I was a teenager"  Vaping Use   Vaping Use: Never used  Substance and Sexual Activity   Alcohol use: Not Currently    Alcohol/week: 28.0 standard drinks of alcohol    Types: 28 Glasses of wine per week    Comment: I had Detox on 07/13/21 and no alcohol since then   Drug use: Never   Sexual activity: Not  Currently  Other Topics Concern   Not on file  Social History Narrative   Lives alone.  She has a BS biology.   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   Social Determinants of Health   Financial Resource Strain: Not on file  Food Insecurity: Not on file  Transportation Needs: Not on file  Physical Activity: Not on file  Stress: Not on file  Social Connections: Not on file   Additional Social History:    Allergies:   Allergies  Allergen Reactions   Doxycycline Swelling    Mouth swelling and sores  Other Reaction(s): Not available   Codeine Itching    Other Reaction(s): Not available   Sulfa Antibiotics     Possibly??   Tetracycline Hcl  Swelling    Mouth swelling and sores    Labs:  Results for orders placed or performed during the hospital encounter of 02/15/22 (from the past 48 hour(s))  Glucose, capillary     Status: None   Collection Time: 02/22/22  6:13 PM  Result Value Ref Range   Glucose-Capillary 86 70 - 99 mg/dL    Comment: Glucose reference range applies only to samples taken after fasting for at least 8 hours.  Glucose, capillary     Status: None   Collection Time: 02/22/22 11:27 PM  Result Value Ref Range   Glucose-Capillary 93 70 - 99 mg/dL    Comment: Glucose reference range applies only to samples taken after fasting for at least 8 hours.  Glucose, capillary     Status: Abnormal   Collection Time: 02/23/22  5:58 AM  Result Value Ref Range   Glucose-Capillary 108 (H) 70 - 99 mg/dL    Comment: Glucose reference range applies only to samples taken after fasting for at least 8 hours.  Basic metabolic panel     Status: Abnormal   Collection Time: 02/23/22  8:25 AM  Result Value Ref Range   Sodium 138 135 - 145 mmol/L   Potassium 4.1 3.5 - 5.1 mmol/L   Chloride 105 98 - 111 mmol/L   CO2 20 (L) 22 - 32 mmol/L   Glucose, Bld 84 70 - 99 mg/dL    Comment: Glucose reference range applies only to samples taken after fasting for  at least 8 hours.   BUN 9 8 - 23 mg/dL   Creatinine, Ser 0.70 0.44 - 1.00 mg/dL   Calcium 9.1 8.9 - 10.3 mg/dL   GFR, Estimated >60 >60 mL/min    Comment: (NOTE) Calculated using the CKD-EPI Creatinine Equation (2021)    Anion gap 13 5 - 15    Comment: Performed at [facility], [place] [address], [place], [place] [address]  CBC     Status: None   Collection Time: 02/23/22  8:25 AM  Result Value Ref Range   WBC 5.2 4.0 - 10.5 K/uL   RBC 4.32 3.87 - 5.11 MIL/uL   Hemoglobin 13.6 12.0 - 15.0 g/dL   HCT 40.8 36.0 - 46.0 %   MCV 94.4 80.0 - 100.0 fL   MCH 31.5 26.0 - 34.0 pg   MCHC 33.3 30.0 - 36.0 g/dL   RDW 14.0 11.5 - 15.5 %   Platelets 224 150 - 400 K/uL   nRBC 0.0 0.0 - 0.2 %    Comment: Performed at [facility], [place] [address] [place], [place] [address]  Magnesium     Status: None   Collection Time: 02/23/22  8:25 AM  Result Value Ref Range   Magnesium 2.0 1.7 - 2.4 mg/dL    Comment: Performed at [facility], [place] [address], [place], [place] [address]  Glucose, capillary     Status: Abnormal   Collection Time: 02/23/22 11:35 AM  Result Value Ref Range   Glucose-Capillary 103 (H) 70 - 99 mg/dL    Comment: Glucose reference range applies only to samples taken after fasting for at least 8 hours.  Glucose, capillary     Status: Abnormal   Collection Time: 02/23/22  5:48 PM  Result Value Ref Range   Glucose-Capillary 104 (H) 70 - 99 mg/dL    Comment: Glucose reference range applies only to samples taken after fasting for at least 8 hours.  Glucose, capillary  Status: None   Collection Time: 02/23/22 11:59 PM  Result Value Ref Range   Glucose-Capillary 97 70 - 99 mg/dL    Comment: Glucose reference range applies only to samples taken after fasting for at least 8 hours.  Glucose, capillary     Status: Abnormal   Collection Time: 02/24/22  6:06 AM  Result Value Ref Range   Glucose-Capillary 106 (H) 70 - 99 mg/dL     Comment: Glucose reference range applies only to samples taken after fasting for at least 8 hours.  Magnesium     Status: None   Collection Time: 02/24/22  8:14 AM  Result Value Ref Range   Magnesium 2.0 1.7 - 2.4 mg/dL    Comment: Performed at [facility], [place] [address], [place], [place] [address]  Glucose, capillary     Status: Abnormal   Collection Time: 02/24/22 11:39 AM  Result Value Ref Range   Glucose-Capillary 115 (H) 70 - 99 mg/dL    Comment: Glucose reference range applies only to samples taken after fasting for at least 8 hours.    Current Facility-Administered Medications  Medication Dose Route Frequency Provider Last Rate Last Admin   acetaminophen (TYLENOL) tablet 650 mg  650 mg Oral Q6H PRN Amin, Ankit Chirag, MD   650 mg at 02/22/22 1111   baclofen (LIORESAL) tablet 5 mg  5 mg Oral TID Suella Broad, FNP   5 mg at 02/24/22 1643   buPROPion ER (WELLBUTRIN SR) 12 hr tablet 100 mg  100 mg Oral BID Kc, Maren Beach, MD   100 mg at 02/24/22 0944   busPIRone (BUSPAR) tablet 15 mg  15 mg Oral TID Antonieta Pert, MD   15 mg at 02/24/22 1643   carvedilol (COREG) tablet 6.25 mg  6.25 mg Oral BID WC Kc, Ramesh, MD   6.25 mg at 02/24/22 1643   enoxaparin (LOVENOX) injection 40 mg  40 mg Subcutaneous Q24H Irene Pap N, DO   40 mg at 02/24/22 0944   feeding supplement (ENSURE ENLIVE / ENSURE PLUS) liquid 237 mL  237 mL Oral BID BM Bonnielee Haff, MD   237 mL at 02/24/22 1323   fluticasone (FLONASE) 50 MCG/ACT nasal spray 2 spray  2 spray Each Nare Daily PRN Antonieta Pert, MD       folic acid (FOLVITE) tablet 1 mg  1 mg Oral Daily Desai, Rahul P, PA-C   1 mg at 02/24/22 0944   guaiFENesin (ROBITUSSIN) 100 MG/5ML liquid 5 mL  5 mL Oral Q4H PRN Amin, Ankit Chirag, MD   5 mL at 02/21/22 1435   hydrALAZINE (APRESOLINE) injection 10 mg  10 mg Intravenous Q4H PRN Amin, Ankit Chirag, MD   10 mg at 02/17/22 1637   hydrocortisone cream 1 % 1 Application  1 Application Topical  TID PRN Damita Lack, MD   1 Application at 28/00/34 2148   ipratropium-albuterol (DUONEB) 0.5-2.5 (3) MG/3ML nebulizer solution 3 mL  3 mL Nebulization Q4H PRN Amin, Ankit Chirag, MD       LORazepam (ATIVAN) tablet 1 mg  1 mg Oral Q6H PRN Amin, Ankit Chirag, MD   1 mg at 02/21/22 2146   LORazepam (ATIVAN) tablet 2 mg  2 mg Oral TID Suella Broad, FNP   2 mg at 02/24/22 1643   melatonin tablet 6 mg  6 mg Oral QHS PRN Raenette Rover, NP   6 mg at 02/23/22 2148   metoprolol tartrate (LOPRESSOR) injection 5 mg  5  mg Intravenous Q4H PRN Amin, Jeanella Flattery, MD       multivitamin with minerals tablet 1 tablet  1 tablet Oral Daily Desai, Rahul P, PA-C   1 tablet at 02/24/22 0944   ondansetron (ZOFRAN) tablet 4 mg  4 mg Oral Q6H PRN Minda Ditto, RPH   4 mg at 02/20/22 0827   Oral care mouth rinse  15 mL Mouth Rinse PRN Hall, Carole N, DO       polyethylene glycol (MIRALAX / GLYCOLAX) packet 17 g  17 g Oral Daily Bonnielee Haff, MD   17 g at 02/24/22 1155   prochlorperazine (COMPAZINE) injection 5 mg  5 mg Intravenous Q6H PRN Irene Pap N, DO   5 mg at 02/16/22 0240   senna-docusate (Senokot-S) tablet 2 tablet  2 tablet Oral BID Bonnielee Haff, MD   2 tablet at 02/24/22 1155   thiamine (VITAMIN B1) tablet 100 mg  100 mg Oral Daily Bonnielee Haff, MD   100 mg at 02/24/22 9735      Psychiatric Specialty Exam:  Presentation  General Appearance:  Appropriate for Environment; Casual  Eye Contact: Fair  Speech: Clear and Coherent; Normal Rate  Speech Volume: Normal  Handedness: Right   Mood and Affect  Mood: Euthymic  Affect: Appropriate; Congruent   Thought Process  Thought Processes: Coherent; Linear  Descriptions of Associations:Intact  Orientation:Full (Time, Place and Person)  Thought Content:WDL  History of Schizophrenia/Schizoaffective disorder:No data recorded Duration of Psychotic Symptoms:No data recorded Hallucinations:Hallucinations:  None  Ideas of Reference:None  Suicidal Thoughts:Suicidal Thoughts: No  Homicidal Thoughts:Homicidal Thoughts: No   Sensorium  Memory: Immediate Good; Recent Good; Remote Good  Judgment: Fair  Insight: Good   Executive Functions  Concentration: Good  Attention Span: Good  Recall: Good  Fund of Knowledge: Good  Language: Good   Psychomotor Activity  Psychomotor Activity:Psychomotor Activity: Normal   Assets  Assets: Desire for Improvement; Social Support; Resilience   Sleep  Sleep:Sleep: Good   Physical Exam: Physical Exam Vitals and nursing note reviewed.  Constitutional:      Appearance: Normal appearance. She is normal weight.  Neurological:     General: No focal deficit present.     Mental Status: She is alert and oriented to person, place, and time. Mental status is at baseline.  Psychiatric:        Mood and Affect: Mood normal.        Behavior: Behavior normal.        Thought Content: Thought content normal.        Judgment: Judgment normal.    Review of Systems  Neurological: Negative.   Psychiatric/Behavioral: Negative.    All other systems reviewed and are negative.  Blood pressure 139/61, pulse 80, temperature (!) 97.5 F (36.4 C), temperature source Oral, resp. rate 17, height '5\' 2"'$  (1.575 m), weight 56.3 kg, SpO2 97 %. Body mass index is 22.7 kg/m.  Treatment Plan Summary: Daily contact with patient to assess and evaluate symptoms and progress in treatment and Medication management  As noted by previous provider, psychiatry will continue to follow during this inpatient admission -DC ativan.  -DC thiamine 500 mg  -Continue baclofen 5 mg p.o. twice daily  Disposition: No evidence of imminent risk to self or others at present.   Patient does not meet criteria for psychiatric inpatient admission. Supportive therapy provided about ongoing stressors. Refer to IOP. Discussed crisis plan, support from social network, calling  911, coming to the Emergency Department, and calling  Suicide Hotline.  Suella Broad, FNP 02/24/2022 5:03 PM

## 2022-02-24 NOTE — Progress Notes (Signed)
PT Cancellation Note  Patient Details Name: Terri Ayala MRN: [id number] DOB: 01-22-1956   Cancelled Treatment:    PT order received but eval deferred this date.  Pt states she is sleeping and she walks just fine!  Will re-attempt in am.   Earsie Humm 02/24/2022, 3:41 PM

## 2022-02-25 ENCOUNTER — Other Ambulatory Visit: Payer: Self-pay

## 2022-02-25 ENCOUNTER — Encounter (HOSPITAL_COMMUNITY): Payer: Self-pay

## 2022-02-25 ENCOUNTER — Emergency Department (HOSPITAL_COMMUNITY)
Admission: EM | Admit: 2022-02-25 | Discharge: 2022-02-27 | Disposition: A | Discharge status: Home / Self Care | Payer: Medicare Other | Attending: Emergency Medicine | Admitting: Emergency Medicine

## 2022-02-25 DIAGNOSIS — I471 Supraventricular tachycardia, unspecified: Secondary | ICD-10-CM | POA: Insufficient documentation

## 2022-02-25 DIAGNOSIS — F411 Generalized anxiety disorder: Secondary | ICD-10-CM | POA: Diagnosis not present

## 2022-02-25 DIAGNOSIS — F419 Anxiety disorder, unspecified: Secondary | ICD-10-CM | POA: Insufficient documentation

## 2022-02-25 DIAGNOSIS — G9341 Metabolic encephalopathy: Secondary | ICD-10-CM | POA: Diagnosis not present

## 2022-02-25 DIAGNOSIS — F32A Depression, unspecified: Secondary | ICD-10-CM | POA: Insufficient documentation

## 2022-02-25 DIAGNOSIS — R4182 Altered mental status, unspecified: Secondary | ICD-10-CM | POA: Insufficient documentation

## 2022-02-25 DIAGNOSIS — R451 Restlessness and agitation: Secondary | ICD-10-CM | POA: Insufficient documentation

## 2022-02-25 LAB — CBC WITH DIFFERENTIAL/PLATELET
Abs Immature Granulocytes: 0.04 10*3/uL (ref 0.00–0.07)
Basophils Absolute: 0.1 10*3/uL (ref 0.0–0.1)
Basophils Relative: 1 %
Eosinophils Absolute: 0.1 10*3/uL (ref 0.0–0.5)
Eosinophils Relative: 1 %
HCT: 47.3 % — ABNORMAL HIGH (ref 36.0–46.0)
Hemoglobin: 16.7 g/dL — ABNORMAL HIGH (ref 12.0–15.0)
Immature Granulocytes: 1 %
Lymphocytes Relative: 34 %
Lymphs Abs: 2.8 10*3/uL (ref 0.7–4.0)
MCH: 31.6 pg (ref 26.0–34.0)
MCHC: 35.3 g/dL (ref 30.0–36.0)
MCV: 89.6 fL (ref 80.0–100.0)
Monocytes Absolute: 0.8 10*3/uL (ref 0.1–1.0)
Monocytes Relative: 9 %
Neutro Abs: 4.3 10*3/uL (ref 1.7–7.7)
Neutrophils Relative %: 54 %
Platelets: 298 10*3/uL (ref 150–400)
RBC: 5.28 MIL/uL — ABNORMAL HIGH (ref 3.87–5.11)
RDW: 13.5 % (ref 11.5–15.5)
WBC: 8 10*3/uL (ref 4.0–10.5)
nRBC: 0 % (ref 0.0–0.2)

## 2022-02-25 LAB — COMPREHENSIVE METABOLIC PANEL
ALT: 26 U/L (ref 0–44)
AST: 27 U/L (ref 15–41)
Albumin: 4.5 g/dL (ref 3.5–5.0)
Alkaline Phosphatase: 59 U/L (ref 38–126)
Anion gap: 15 (ref 5–15)
BUN: 15 mg/dL (ref 8–23)
CO2: 20 mmol/L — ABNORMAL LOW (ref 22–32)
Calcium: 10.2 mg/dL (ref 8.9–10.3)
Chloride: 106 mmol/L (ref 98–111)
Creatinine, Ser: 0.97 mg/dL (ref 0.44–1.00)
GFR, Estimated: >60 mL/min (ref >60)
Glucose, Bld: 98 mg/dL (ref 70–99)
Potassium: 3.4 mmol/L — ABNORMAL LOW (ref 3.5–5.1)
Sodium: 141 mmol/L (ref 135–145)
Total Bilirubin: 0.8 mg/dL (ref 0.3–1.2)
Total Protein: 8.5 g/dL — ABNORMAL HIGH (ref 6.5–8.1)

## 2022-02-25 LAB — GLUCOSE, CAPILLARY: Glucose-Capillary: 105 mg/dL — ABNORMAL HIGH (ref 70–99)

## 2022-02-25 LAB — ETHANOL: Alcohol, Ethyl (B): <10 mg/dL (ref <10)

## 2022-02-25 MED ORDER — LORAZEPAM 1 MG PO TABS
2.0000 mg | ORAL_TABLET | Freq: Four times a day (QID) | ORAL | Status: DC | PRN
  Administered 2022-02-25 – 2022-02-26 (×2): 2 mg via ORAL
  Filled 2022-02-25 (×2): qty 2

## 2022-02-25 MED ORDER — CARVEDILOL 3.125 MG PO TABS
6.2500 mg | ORAL_TABLET | Freq: Two times a day (BID) | ORAL | Status: DC
  Administered 2022-02-26 – 2022-02-27 (×2): 6.25 mg via ORAL
  Filled 2022-02-25 (×2): qty 2

## 2022-02-25 MED ORDER — PANTOPRAZOLE SODIUM 40 MG PO TBEC
40.0000 mg | DELAYED_RELEASE_TABLET | Freq: Two times a day (BID) | ORAL | Status: DC
  Administered 2022-02-25 – 2022-02-27 (×4): 40 mg via ORAL
  Filled 2022-02-25 (×4): qty 1

## 2022-02-25 MED ORDER — LORAZEPAM 2 MG/ML IJ SOLN
1.0000 mg | Freq: Once | INTRAMUSCULAR | Status: AC
  Administered 2022-02-25: 1 mg via INTRAMUSCULAR
  Filled 2022-02-25: qty 1

## 2022-02-25 MED ORDER — BACLOFEN 10 MG PO TABS
5.0000 mg | ORAL_TABLET | Freq: Two times a day (BID) | ORAL | Status: DC
  Administered 2022-02-25 – 2022-02-27 (×4): 5 mg via ORAL
  Filled 2022-02-25 (×4): qty 1

## 2022-02-25 MED ORDER — BUPROPION HCL ER (SR) 100 MG PO TB12
100.0000 mg | ORAL_TABLET | Freq: Two times a day (BID) | ORAL | Status: DC
  Administered 2022-02-25 – 2022-02-26 (×3): 100 mg via ORAL
  Filled 2022-02-25 (×4): qty 1

## 2022-02-25 MED ORDER — TRAZODONE HCL 100 MG PO TABS
50.0000 mg | ORAL_TABLET | Freq: Every day | ORAL | Status: DC
  Administered 2022-02-25 – 2022-02-26 (×2): 50 mg via ORAL
  Filled 2022-02-25 (×2): qty 1

## 2022-02-25 MED ORDER — BUSPIRONE HCL 10 MG PO TABS
15.0000 mg | ORAL_TABLET | Freq: Three times a day (TID) | ORAL | Status: DC
  Administered 2022-02-25 – 2022-02-27 (×4): 15 mg via ORAL
  Filled 2022-02-25 (×4): qty 2

## 2022-02-25 MED ORDER — GABAPENTIN 300 MG PO CAPS
300.0000 mg | ORAL_CAPSULE | Freq: Two times a day (BID) | ORAL | Status: DC
  Administered 2022-02-25 – 2022-02-27 (×4): 300 mg via ORAL
  Filled 2022-02-25 (×4): qty 1

## 2022-02-25 MED ORDER — ACETAMINOPHEN 500 MG PO TABS: 500.0000 mg | ORAL_TABLET | Freq: Three times a day (TID) | ORAL | Status: DC | PRN

## 2022-02-25 NOTE — TOC Transition Note (Signed)
Transition of Care [facility]) - CM/SW Discharge Note   Patient Details  Name: ROBINA HAMOR MRN: [id number] Date of Birth: 04-26-1955  [facility]) CM/SW Contact:  Leeroy Cha, RN Phone Number: 02/25/2022, 10:48 AM   Clinical Narrative:    Patient dcd to return home with self care. Was cleared by psych.  Patient left with a friend to go home.   Final next level of care: Home/Self Care Barriers to Discharge: Barriers Resolved   Patient Goals and CMS Choice CMS Medicare.gov Compare Post Acute Care list provided to:: Patient Choice offered to / list presented to : Patient  Discharge Placement                         Discharge Plan and Services Additional resources added to the After Visit Summary for   In-house Referral: NA Discharge Planning Services: CM Consult                                 Social Determinants of Health (SDOH) Interventions SDOH Screenings   Depression (PHQ2-9): Low Risk  (12/07/2021)  Tobacco Use: Medium Risk (02/15/2022)     Readmission Risk Interventions   No data to display

## 2022-02-25 NOTE — Progress Notes (Signed)
PT Cancellation Note  Patient Details Name: ANGELIE KRAM MRN: [id number] DOB: 1956/01/24   Cancelled Treatment:    Reason Eval/Treat Not Completed: PT screened, no needs identified, will sign off  Patient is DC'd, Patient did  stand and ambulate a short distance with/without RW. Caregiver in room, will have 24/7 care. [place] Office [phone] Weekend [phone] Claretha Cooper 02/25/2022, 10:21 AM

## 2022-02-25 NOTE — ED Provider Notes (Signed)
[facility] DEPT Provider Note   CSN: [id number] Arrival date & time: 02/25/22  1953     History  Chief Complaint  Patient presents with   Altered Mental Status    Terri Ayala is a 66 y.o. female history of polysubstance abuse, depression, anxiety, here presenting with agitation.  Patient was just admitted to the hospital for the last 10 days.  Patient came in for agitation and required course of phenobarbital.  Patient had extensive workup including CT head and normal TSH and folate and B12 levels.  Patient was started with Ativan with improvement and eventually was seen by psychiatry and cleared by psychiatry yesterday.  Patient was supposed to resume baclofen twice daily.  She just went home today.  She has a home health agency that came to take care of her.  I discussed with the agency staff Justice Rocher [place] [phone]).  She states that she has an aide that comes out and leaves around 5 PM.  Patient was noted to be crying and tearful all day.  As soon as the aide left, she panicked and started screaming and yelling and became agitated and called her mother and pressed the medical alert button.  Patient initially told me that she does not want to be here but also was anxious and told me that she does not know what to do at home.  The history is provided by the patient.       Home Medications Prior to Admission medications   Medication Sig Start Date End Date Taking? Authorizing Provider  acetaminophen (TYLENOL) 500 MG tablet Take 500 mg by mouth every 8 (eight) hours as needed.    [provider]  albuterol (VENTOLIN HFA) 108 (90 Base) MCG/ACT inhaler INHALE 2 PUFFS INTO THE LUNGS EVERY 6 HOURS AS NEEDED FOR WHEEZING OR SHORTNESS OF BREATH. Patient taking differently: Inhale 2 puffs into the lungs every 6 (six) hours as needed for wheezing or shortness of breath. 07/02/21   Lorrene Reid, PA-C  alendronate (FOSAMAX) 70 MG tablet Take 70 mg by  mouth every Monday.    [provider]  ampicillin (PRINCIPEN) 500 MG capsule Take 500 mg by mouth daily. 09/03/21   [provider]  Azelaic Acid 15 % gel     [provider]  baclofen 5 MG TABS Take 5 mg by mouth 2 (two) times daily. 02/24/22   Bonnielee Haff, MD  buPROPion [facility] SR) 100 MG 12 hr tablet Take 1 tablet (100 mg total) by mouth 2 (two) times daily. 12/17/21 03/17/22  Dara Hoyer, PA-C  busPIRone (BUSPAR) 15 MG tablet Take 1 tablet (15 mg total) by mouth 3 (three) times daily. 12/17/21   Dara Hoyer, PA-C  Calcium Carbonate-Vitamin D (CALCIUM 600+D PO) Take 1 tablet by mouth daily.    [provider]  carvedilol (COREG) 6.25 MG tablet Take 1 tablet (6.25 mg total) by mouth 2 (two) times daily with a meal. 12/07/21   Abonza, [place], PA-C  CLENPIQ 10-3.5-12 MG-GM -GM/175ML SOLN SMARTSIG:175 Milliliter(s) By Mouth Twice a Week 12/09/21   [provider]  clindamycin (CLINDAGEL) 1 % gel     [provider]  Dermatological Products, [place] ROSACEA EX) Apply topically Once daily for 30 09/03/21 03/01/22  [provider]  diclofenac Sodium (VOLTAREN) 1 % GEL Apply 2 g 4 times a day by topical route.    [provider]  fluocinonide (LIDEX) 0.05 % external solution  [provider]  fluticasone (FLONASE) 50 MCG/ACT nasal spray Place 2 sprays into both nostrils daily as needed for allergies.    [provider]  fluticasone (FLOVENT HFA) 110 MCG/ACT inhaler 2 puff(s) Puff and swallow/ not for asthma, for EOE 2 times a day for 30 day(s) 11/03/21   [provider]  folic acid (FOLVITE) 1 MG tablet TAKE 1 TABLET BY MOUTH DAILY. 01/05/22   Lorrene Reid, PA-C  gabapentin (NEURONTIN) 300 MG capsule Take 1 capsule twice a day by oral route.    [provider]  hydrocortisone 2.5 % cream hydrocortisone 2.5 % topical cream  APPLY TO AFFECTED AREA TWICE A DAY FOR 14 DAYS     [provider]  ketoconazole (NIZORAL) 2 % shampoo APPLY 5 MLS TO AFFECTED AREA 2 TO 3 TIMES A WEEK for 30    [provider]  meloxicam (MOBIC) 15 MG tablet Take 1 tablet (15 mg total) by mouth daily as needed for pain. 08/03/21   Lorrene Reid, PA-C  Multiple Vitamins-Minerals (ADULT GUMMY PO) Take 2 capsules by mouth daily.    [provider]  norethindrone-ethinyl estradiol (FEMHRT 1/5) 1-5 MG-MCG TABS tablet Take 1 tablet by mouth daily.    [provider]  ondansetron (ZOFRAN-ODT) 8 MG disintegrating tablet     [provider]  pantoprazole (PROTONIX) 40 MG tablet TAKE 1 TABLET BY MOUTH 2 TIMES DAILY. 01/11/21   Lorrene Reid, PA-C  SUMAtriptan (IMITREX) 100 MG tablet Take 1 tablet (100 mg total) by mouth every 2 (two) hours as needed for migraine. May repeat in 2 hours if headache persists or recurs. 12/07/21   Lorrene Reid, PA-C  thiamine (VITAMIN B-1) 100 MG tablet Take 1 tablet (100 mg total) by mouth daily. 02/25/22   Bonnielee Haff, MD  traZODone (DESYREL) 50 MG tablet Take 50 mg by mouth at bedtime.    [provider]  triamcinolone cream (KENALOG) 0.1 % APPLY TO AFFECTED AREA TWICE A DAY    [provider]      Allergies    Doxycycline, Codeine, Sulfa antibiotics, and Tetracycline hcl    Review of Systems   Review of Systems  Psychiatric/Behavioral:  Positive for agitation and confusion.   All other systems reviewed and are negative.   Physical Exam Updated Vital Signs BP (!) 153/97   Pulse 92   Temp 97.7 F (36.5 C) (Oral)   Resp 13   Ht '5\' 2"'$  (1.575 m)   Wt 56.7 kg   SpO2 96%   BMI 22.86 kg/m  Physical Exam Vitals and nursing note reviewed.  Constitutional:      Comments: Anxious and agitated  HENT:     Head: Normocephalic and atraumatic.     Nose: Nose normal.     Mouth/Throat:     Mouth: Mucous membranes are moist.  Eyes:     Extraocular Movements: Extraocular movements intact.      Pupils: Pupils are equal, round, and reactive to light.  Cardiovascular:     Rate and Rhythm: Normal rate and regular rhythm.     Pulses: Normal pulses.     Heart sounds: Normal heart sounds.  Pulmonary:     Effort: Pulmonary effort is normal.     Breath sounds: Normal breath sounds.  Abdominal:     General: Abdomen is flat.     Palpations: Abdomen is soft.  Genitourinary:    Comments: Rectal-some stool in the rectal vault Musculoskeletal:  General: Normal range of motion.     Cervical back: Normal range of motion and neck supple.  Skin:    General: Skin is warm.  Neurological:     General: No focal deficit present.     Mental Status: She is oriented to person, place, and time.     Comments: Patient is ANO x 3.  She is moving all extremities  Psychiatric:     Comments: Patient is agitated and anxious and pacing in the room     ED Results / Procedures / Treatments   Labs (all labs ordered are listed, but only abnormal results are displayed) Labs Reviewed  CBC WITH DIFFERENTIAL/PLATELET - Abnormal; Notable for the following components:      Result Value   RBC 5.28 (*)    Hemoglobin 16.7 (*)    HCT 47.3 (*)    All other components within normal limits  COMPREHENSIVE METABOLIC PANEL - Abnormal; Notable for the following components:   Potassium 3.4 (*)    CO2 20 (*)    Total Protein 8.5 (*)    All other components within normal limits  ETHANOL  RAPID URINE DRUG SCREEN, HOSP PERFORMED    EKG None  Radiology No results found.  Procedures Procedures    Medications Ordered in ED Medications  LORazepam (ATIVAN) injection 1 mg (1 mg Intramuscular Given 02/25/22 2032)    ED Course/ Medical Decision Making/ A&P                           Medical Decision Making LURENA NAEVE is a 66 y.o. female here with anxiety and agitation.  Patient initially told me that she just wants to go home.  In fact she was admitted for the last 10 days for agitation and  encephalopathy secondary to marijuana use and polypharmacy.  Patient also was seen by psych yesterday was cleared by psych.  Patient just went home today and came right back for the same complaint.  I reviewed her chart from the hospitalization stay.  I think patient likely has uncontrolled anxiety and may require to be in a Geri psych unit as she is not safe to go home.  Since she was cleared by psych yesterday and she has no suicidal homicidal ideations, I did not consult psych again.  I will restart her on Ativan and also consult social work for placement.  I talked to the home health staff member Heywood Bene [phone]) and also patient's mother (who lives in [place]).  They both agree that she is not safe at home given her current condition and would like to seek placement.  10:08 PM Patient's labs are unremarkable.  Patient is medically cleared.  Started on home meds.  Patient is calm after given Ativan IM.  Social worker requests physical therapy consult   Problems Addressed: Agitation: acute illness or injury Anxiety: acute illness or injury  Amount and/or Complexity of Data Reviewed Labs: ordered. Decision-making details documented in ED Course.  Risk Prescription drug management.    Final Clinical Impression(s) / ED Diagnoses Final diagnoses:  Agitation  Anxiety    Rx / DC Orders ED Discharge Orders     None         Drenda Freeze, MD 02/25/22 2210

## 2022-02-25 NOTE — ED Triage Notes (Signed)
Arrives EMS from home after called out for severe anxiety and "needing help".   Recently admitted for ~10 days with confusion and metabolic encephalopathy,   Pt screaming for help saying everything hurts but unable to elaborate at all.

## 2022-02-26 DIAGNOSIS — F411 Generalized anxiety disorder: Secondary | ICD-10-CM | POA: Diagnosis not present

## 2022-02-26 MED ORDER — POLYETHYLENE GLYCOL 3350 17 G PO PACK
17.0000 g | PACK | Freq: Every day | ORAL | Status: DC
  Filled 2022-02-26: qty 1

## 2022-02-26 NOTE — Evaluation (Signed)
Physical Therapy Evaluation Patient Details Name: Terri Ayala MRN: [id number] DOB: September 03, 1955 Today's Date: 02/26/2022  History of Present Illness  Pt is a 66yo female presenting to [facility] ED on 02/25/22 with anxiety and agitation. Pt discharged early same day for similar complaint.  PMH: EOTH, anxiety & depression, chronic lower back pain, fatty liver, GERD, HLD, HTN, OP.;   Clinical Impression  Pt presents with the problems listed above and functional impairments below. Pt had aide 24/7 at baseline, reports she ambulates independently and only requires assist with iADLs like housework (aide did report pt had a fall recently which pt denies). Pt demonstrates 5/5 strength in BLE with functional ROM. Pt required mod assist for bed mobility and min guard for transfers and ambulation in hallway with RW; pt mildly impulsive and pushed RW away and climbed into bed on hands and knees despite PT directions to keep AD close and sit EOB. Recommending HHPT upon discharge. We will continue to follow acutely.     Recommendations for follow up therapy are one component of a multi-disciplinary discharge planning process, led by the attending physician.  Recommendations may be updated based on patient status, additional functional criteria and insurance authorization.  Follow Up Recommendations Home health PT      Assistance Recommended at Discharge PRN  Patient can return home with the following  A little help with walking and/or transfers;A little help with bathing/dressing/bathroom;Assistance with cooking/housework;Assist for transportation;Help with stairs or ramp for entrance    Equipment Recommendations None recommended by PT  Recommendations for Other Services       Functional Status Assessment Patient has had a recent decline in their functional status and demonstrates the ability to make significant improvements in function in a reasonable and predictable amount of time.     Precautions /  Restrictions Precautions Precautions: Fall Precaution Comments: Aide reports hx of falling, pt denies Restrictions Weight Bearing Restrictions: No      Mobility  Bed Mobility Overal bed mobility: Needs Assistance Bed Mobility: Supine to Sit, Sit to Supine     Supine to sit: Mod assist, HOB elevated Sit to supine: Modified independent (Device/Increase time)   General bed mobility comments: Supine to sit: mod assist for trunk elevation, suspect secondary to recent medication, pt with "heavy" eyelids and slow to rose. Sit to supine: pt impulsively climbed into bed on hands and knees despite PT direction to sit on EOB, pt repositioned for comfort.    Transfers Overall transfer level: Needs assistance Equipment used: Rolling walker (2 wheels) Transfers: Sit to/from Stand Sit to Stand: Supervision           General transfer comment: no physical assist; guarding for safety    Ambulation/Gait Ambulation/Gait assistance: Supervision Gait Distance (Feet): 35 Feet Assistive device: Rolling walker (2 wheels) Gait Pattern/deviations: Step-through pattern, Decreased dorsiflexion - right, Decreased dorsiflexion - left, Shuffle, Trunk flexed, Drifts right/left Gait velocity: decreased     General Gait Details: Pt ambulated with RW and supervision, no physical assist reqiured or overt LOB noted, cues for proximity to device and AD management; at end of ambulation task pt abandoned RW and climbed into bed despite PT directions to keep RW close and sit EOB.  Stairs            Wheelchair Mobility    Modified Rankin (Stroke Patients Only)       Balance Overall balance assessment: Mild deficits observed, not formally tested  Pertinent Vitals/Pain Pain Assessment Pain Assessment: No/denies pain Breathing: normal Negative Vocalization: occasional moan/groan, low speech, negative/disapproving quality Facial  Expression: sad, frightened, frown Body Language: tense, distressed pacing, fidgeting Consolability: distracted or reassured by voice/touch PAINAD Score: 4    Home Living Family/patient expects to be discharged to:: Private residence Living Arrangements: Alone Available Help at Discharge: Personal care attendant;Available 24 hours/day Type of Home: House Home Access: Stairs to enter Entrance Stairs-Rails: Right;Left Entrance Stairs-Number of Steps: 1 Alternate Level Stairs-Number of Steps: 14 Home Layout: 1/2 bath on main level;Multi-level Home Equipment: Conservation officer, nature (2 wheels);Cane - single point;BSC/3in1;Shower seat Additional Comments: Aide provided majority of home environment information    Prior Function Prior Level of Function : Independent/Modified Independent;History of Falls (last six months)             Mobility Comments: reports does not use a device (pt with AMS with ?accuracy) ADLs Comments: Aide 24/7, reports aide mostly does housework and cares for dog     Hand Dominance   Dominant Hand: Right    Extremity/Trunk Assessment   Upper Extremity Assessment Upper Extremity Assessment: Overall WFL for tasks assessed    Lower Extremity Assessment Lower Extremity Assessment: Overall WFL for tasks assessed;RLE deficits/detail;LLE deficits/detail RLE Deficits / Details: Strength testing hip/knee/ankle 5/5, functional ROM RLE Sensation: WNL LLE Deficits / Details: Strength testing hip/knee/ankle 5/5, functional ROM LLE Sensation: WNL    Cervical / Trunk Assessment Cervical / Trunk Assessment: Normal  Communication   Communication: HOH  Cognition Arousal/Alertness: Lethargic, Suspect due to medications Behavior During Therapy: Flat affect Overall Cognitive Status: Within Functional Limits for tasks assessed                                          General Comments General comments (skin integrity, edema, etc.): Aide present     Exercises     Assessment/Plan    PT Assessment Patient needs continued PT services  PT Problem List Decreased strength;Decreased activity tolerance;Decreased balance;Decreased mobility       PT Treatment Interventions DME instruction;Gait training;Stair training;Functional mobility training;Therapeutic activities;Therapeutic exercise;Balance training;Neuromuscular re-education;Patient/family education    PT Goals (Current goals can be found in the Care Plan section)  Acute Rehab PT Goals Patient Stated Goal: to go home PT Goal Formulation: With patient Time For Goal Achievement: 03/12/22 Potential to Achieve Goals: Good    Frequency Min 3X/week     Co-evaluation               AM-PAC PT "6 Clicks" Mobility  Outcome Measure Help needed turning from your back to your side while in a flat bed without using bedrails?: None Help needed moving from lying on your back to sitting on the side of a flat bed without using bedrails?: A Little Help needed moving to and from a bed to a chair (including a wheelchair)?: A Little Help needed standing up from a chair using your arms (e.g., wheelchair or bedside chair)?: A Little Help needed to walk in hospital room?: A Little Help needed climbing 3-5 steps with a railing? : A Little 6 Click Score: 19    End of Session Equipment Utilized During Treatment: Gait belt Activity Tolerance: Patient tolerated treatment well;No increased pain Patient left: in bed;with family/visitor present (on hallway stretcher in ED) Nurse Communication: Mobility status PT Visit Diagnosis: History of falling (Z91.81);Difficulty in walking, not elsewhere classified (R26.2)  Time: 4129-0475 PT Time Calculation (min) (ACUTE ONLY): 20 min   Charges:   PT Evaluation $PT Eval Low Complexity: 1 Low          Coolidge [name], PT, DPT [facility] Rehabilitation Department Office: [phone] Weekend pager: [phone]  Coolidge [name] 02/26/2022, 12:15  PM

## 2022-02-26 NOTE — Progress Notes (Signed)
TOC CSW received a consult about geropsych placement. Consult is inappropriate as pt was psych cleared on 02/25/22 with recommendations to follow up out-patient. Pt will need a new psych consult if the MD believes pt needs geropsych placement.    Arlie Solomons.Zykerria Tanton, MSW, [place]  Transitions of Care Clinical Social Worker I Direct Dial: [phone] Fax: [phone] [email or web address]

## 2022-02-26 NOTE — Consult Note (Signed)
[facility] ED ASSESSMENT   Reason for Consult:  GeroPsych Referring Physician:  Dr. Darl Householder Patient Identification: Terri Ayala MRN:  [id number] ED Chief Complaint: GAD (generalized anxiety disorder)  Diagnosis:  Principal Problem:   GAD (generalized anxiety disorder) Active Problems:   Depression   ED Assessment Time Calculation: Start Time: 1500 Stop Time: 1545 Total Time in Minutes (Assessment Completion): 45   HPI: Per Triage Note: "patient arrives EMS from home after calling out "needing help" Recent admission was for 10 days for confusion and matabolic encephalopathy. Patient screaming for help saying everything hurts but unable to elaborate at all".     Subjective: During evaluation [name] is laying in her hospital bed in the hallway with her home helper Erline Levine by her side, patient is in no acute distress, she does not have her hearing aides, so provider had to speak loudly. With patient permission, she allowed this provider to speak in front of her home helper Erline Levine. She is alert, oriented x 4, calm, cooperative and attentive. Her mood is euthymic with congruent affect. She has normal speech, and behavior.  Objectively there is no evidence of psychosis/mania or delusional thinking.  Patient is able to converse coherently, goal directed thoughts, no distractibility, or pre-occupation. She also denies suicidal/self-harm/homicidal ideation.  Patient denies visual hallucinations but says when she is having her panic attacks she begins to hear things "like someone coming out of a door, or I hear voices of people I know". Patient was admitted here at [facility] on 12/19 and stayed for 10 days until yesterday was discharged and Erline Levine she was back because she was not able to stand or walk. Patient lives at home alone, no children, widowed, and home helpers come in to assist with cleaning up and with her ADLs, they do not give her, her medications or make her take her medications, they prompt her to take  medications, as patient stopped taking her prescription medications.  Patient has not had any psychiatric hospitalizations, she has been diagnosed with anxiety, depression and  panic disorder. Patient denies illicit drug use or alcohol, patient is a recovering alcoholic. Patient says her eating and sleep are poor due to her anxiety, she's nauseated all the time. She says when she is having a panic attack, it is brought on by people, chest pains, and they happen when she is at home and she stays in bed and freezes doesn't move, per [place]. Erline Levine also stated that patient has been calling out her mothers name, her mother lives in [place] with patient brother and she does not see them.  Past Psychiatric History: Diagnosis of anxiety, depression and panic disorder  Risk to Self or Others: Is the patient at risk to self? No Has the patient been a risk to self in the past 6 months? No Has the patient been a risk to self within the distant past? No Is the patient a risk to others? No Has the patient been a risk to others in the past 6 months? No Has the patient been a risk to others within the distant past? No  [place] Scale:  [place] ED from 02/25/2022 in [facility] DEPT ED to Hosp-Admission (Discharged) from 02/15/2022 in [place] ED to Hosp-Admission (Discharged) from 01/25/2022 in [facility]  C-SSRS RISK CATEGORY Error: Question 6 not populated No Risk No Risk       AIMS:  , , ,  ,   ASAM:  Substance Abuse:     Past Medical History:  Past Medical History:  Diagnosis Date   Alcohol withdrawal ([place]) 11/2017; 01/04/2018   Alcoholism ([place])    Anxiety    Arthritis    "hands" (01/04/2018)   ASYMPTOMATIC POSTMENOPAUSAL STATUS 08/17/2009   Chronic lower back pain    Depression    DYSPHAGIA 06/19/2007   Fatty liver, alcoholic    GERD (gastroesophageal reflux disease)    GOITER, MULTINODULAR 08/17/2009    Hyperlipidemia    Hypertension    Migraines    "not sure what triggers them; I'll have 1 q couple weeks or more; come 2 days in a row when they come" (01/04/2018)   Mitral valve prolapse    OSTEOPOROSIS 08/17/2009   PONV (postoperative nausea and vomiting)    Supraventricular tachycardia     Past Surgical History:  Procedure Laterality Date   BREAST BIOPSY Right    "benign"   CATARACT EXTRACTION W/ INTRAOCULAR LENS  IMPLANT, BILATERAL     CERVICAL CONE BIOPSY  2000s   CERVIX LESION DESTRUCTION  1991   "dysplasia; lesions"   CHOLECYSTECTOMY N/A 03/14/2013   Procedure: LAPAROSCOPIC CHOLECYSTECTOMY WITH ATTEMPTED INTRAOPERATIVE CHOLANGIOGRAM;  Surgeon: Harl Bowie, MD;  Location: [place];  Service: General;  Laterality: N/A;   HARDWARE REMOVAL Left 07/22/2021   Procedure: HARDWARE REMOVAL;  Surgeon: Altamese Vienna, MD;  Location: [place];  Service: Orthopedics;  Laterality: Left;   laporoscopic abdominal surgery     "for endometriosis"   LEFT HEART CATH AND CORONARY ANGIOGRAPHY N/A 05/07/2018   Procedure: LEFT HEART CATH AND CORONARY ANGIOGRAPHY;  Surgeon: Belva Crome, MD;  Location: [facility];  Service: Cardiovascular;  Laterality: N/A;   ORIF ANKLE FRACTURE Left 02/11/2021   Procedure: OPEN REDUCTION INTERNAL FIXATION (ORIF) ANKLE FRACTURE;  Surgeon: Altamese [place], MD;  Location: [place];  Service: Orthopedics;  Laterality: Left;   TONSILLECTOMY     Family History:  Family History  Problem Relation Age of Onset   Colon cancer Father        Deceased, 31   Osteoporosis Mother        Living, 65   Healthy Brother    Healthy Brother    Healthy Son    Healthy Son    Thyroid disease Neg Hx    Goiter Neg Hx     Social History:  Social History   Substance and Sexual Activity  Alcohol Use Not Currently   Alcohol/week: 28.0 standard drinks of alcohol   Types: 28 Glasses of wine per week   Comment: I had Detox on 07/13/21 and no alcohol since then     Social History    Substance and Sexual Activity  Drug Use Never    Social History   Socioeconomic History   Marital status: Single    Spouse name: Not on file   Number of children: Not on file   Years of education: Not on file   Highest education level: Not on file  Occupational History   Occupation: Brewing technologist - retired    Fish farm manager: [employer]  Tobacco Use   Smoking status: Former    Packs/day: 1.00    Years: 7.00    Total pack years: 7.00    Types: Cigarettes    Quit date: 02/28/1974    Years since quitting: 48.0   Smokeless tobacco: Never   Tobacco comments:    01/04/2018 "smoked when I was a teenager"  Vaping Use   Vaping  Use: Never used  Substance and Sexual Activity   Alcohol use: Not Currently    Alcohol/week: 28.0 standard drinks of alcohol    Types: 28 Glasses of wine per week    Comment: I had Detox on 07/13/21 and no alcohol since then   Drug use: Never   Sexual activity: Not Currently  Other Topics Concern   Not on file  Social History Narrative   Lives alone.  She has a BS biology.   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   Social Determinants of Health   Financial Resource Strain: Not on file  Food Insecurity: Not on file  Transportation Needs: Not on file  Physical Activity: Not on file  Stress: Not on file  Social Connections: Not on file      Allergies:   Allergies  Allergen Reactions   Doxycycline Swelling    Mouth swelling and sores  Other Reaction(s): Not available   Codeine Itching    Other Reaction(s): Not available   Sulfa Antibiotics     Possibly??   Tetracycline Hcl Swelling    Mouth swelling and sores    Labs:  Results for orders placed or performed during the hospital encounter of 02/25/22 (from the past 48 hour(s))  CBC with Differential     Status: Abnormal   Collection Time: 02/25/22  8:39 PM  Result Value Ref Range   WBC 8.0 4.0 - 10.5 K/uL   RBC 5.28 (H) 3.87 - 5.11 MIL/uL   Hemoglobin 16.7  (H) 12.0 - 15.0 g/dL   HCT 47.3 (H) 36.0 - 46.0 %   MCV 89.6 80.0 - 100.0 fL   MCH 31.6 26.0 - 34.0 pg   MCHC 35.3 30.0 - 36.0 g/dL   RDW 13.5 11.5 - 15.5 %   Platelets 298 150 - 400 K/uL   nRBC 0.0 0.0 - 0.2 %   Neutrophils Relative % 54 %   Neutro Abs 4.3 1.7 - 7.7 K/uL   Lymphocytes Relative 34 %   Lymphs Abs 2.8 0.7 - 4.0 K/uL   Monocytes Relative 9 %   Monocytes Absolute 0.8 0.1 - 1.0 K/uL   Eosinophils Relative 1 %   Eosinophils Absolute 0.1 0.0 - 0.5 K/uL   Basophils Relative 1 %   Basophils Absolute 0.1 0.0 - 0.1 K/uL   Immature Granulocytes 1 %   Abs Immature Granulocytes 0.04 0.00 - 0.07 K/uL    Comment: Performed at [facility], [place] [address] [place], [place] [address]  Comprehensive metabolic panel     Status: Abnormal   Collection Time: 02/25/22  8:39 PM  Result Value Ref Range   Sodium 141 135 - 145 mmol/L   Potassium 3.4 (L) 3.5 - 5.1 mmol/L   Chloride 106 98 - 111 mmol/L   CO2 20 (L) 22 - 32 mmol/L   Glucose, Bld 98 70 - 99 mg/dL    Comment: Glucose reference range applies only to samples taken after fasting for at least 8 hours.   BUN 15 8 - 23 mg/dL   Creatinine, Ser 0.97 0.44 - 1.00 mg/dL   Calcium 10.2 8.9 - 10.3 mg/dL   Total Protein 8.5 (H) 6.5 - 8.1 g/dL   Albumin 4.5 3.5 - 5.0 g/dL   AST 27 15 - 41 U/L   ALT 26 0 - 44 U/L   Alkaline Phosphatase 59 38 - 126 U/L   Total Bilirubin 0.8 0.3 - 1.2 mg/dL   GFR,  Estimated >60 >60 mL/min    Comment: (NOTE) Calculated using the CKD-EPI Creatinine Equation (2021)    Anion gap 15 5 - 15    Comment: Performed at [facility], [place] [address], [place], [place] [address]  Ethanol     Status: None   Collection Time: 02/25/22  8:39 PM  Result Value Ref Range   Alcohol, Ethyl (B) <10 <10 mg/dL    Comment: (NOTE) Lowest detectable limit for serum alcohol is 10 mg/dL.  For medical purposes only. Performed at [facility], [place] [address], [place], [place] [address]     Current Facility-Administered Medications  Medication Dose Route Frequency Provider Last Rate Last Admin   acetaminophen (TYLENOL) tablet 500 mg  500 mg Oral Q8H PRN Drenda Freeze, MD       baclofen (LIORESAL) tablet 5 mg  5 mg Oral BID Drenda Freeze, MD   5 mg at 02/26/22 0901   buPROPion ER [facility] SR) 12 hr tablet 100 mg  100 mg Oral BID Drenda Freeze, MD   100 mg at 02/26/22 0901   busPIRone (BUSPAR) tablet 15 mg  15 mg Oral TID Drenda Freeze, MD   15 mg at 02/26/22 0905   carvedilol (COREG) tablet 6.25 mg  6.25 mg Oral BID WC Drenda Freeze, MD   6.25 mg at 02/26/22 3785   gabapentin (NEURONTIN) capsule 300 mg  300 mg Oral BID Drenda Freeze, MD   300 mg at 02/26/22 0902   LORazepam (ATIVAN) tablet 2 mg  2 mg Oral Q6H PRN Drenda Freeze, MD   2 mg at 02/26/22 0902   pantoprazole (PROTONIX) EC tablet 40 mg  40 mg Oral BID Drenda Freeze, MD   40 mg at 02/26/22 0902   polyethylene glycol (MIRALAX / GLYCOLAX) packet 17 g  17 g Oral Daily Drenda Freeze, MD       traZODone (DESYREL) tablet 50 mg  50 mg Oral QHS Drenda Freeze, MD   50 mg at 02/25/22 2230   Current Outpatient Medications  Medication Sig Dispense Refill   acetaminophen (TYLENOL) 500 MG tablet Take 500 mg by mouth every 8 (eight) hours as needed.     albuterol (VENTOLIN HFA) 108 (90 Base) MCG/ACT inhaler INHALE 2 PUFFS INTO THE LUNGS EVERY 6 HOURS AS NEEDED FOR WHEEZING OR SHORTNESS OF BREATH. (Patient taking differently: Inhale 2 puffs into the lungs every 6 (six) hours as needed for wheezing or shortness of breath.) 8.5 g 2   alendronate (FOSAMAX) 70 MG tablet Take 70 mg by mouth every Monday.     ampicillin (PRINCIPEN) 500 MG capsule Take 500 mg by mouth daily.     Azelaic Acid 15 % gel      baclofen 5 MG TABS Take 5 mg by mouth 2 (two) times daily. 60 each 0   buPROPion ER (WELLBUTRIN SR) 100 MG 12 hr tablet Take 1 tablet (100 mg total) by  mouth 2 (two) times daily. 60 tablet 2   busPIRone (BUSPAR) 15 MG tablet Take 1 tablet (15 mg total) by mouth 3 (three) times daily. 90 tablet 2   Calcium Carbonate-Vitamin D (CALCIUM 600+D PO) Take 1 tablet by mouth daily.     carvedilol (COREG) 6.25 MG tablet Take 1 tablet (6.25 mg total) by mouth 2 (two) times daily with a meal. 180 tablet 1   CLENPIQ 10-3.5-12 MG-GM -GM/175ML SOLN SMARTSIG:175 Milliliter(s) By Mouth Twice a Week  clindamycin (CLINDAGEL) 1 % gel      Dermatological Products, Misc. (DERMAREST ROSACEA EX) Apply topically Once daily for 30     diclofenac Sodium (VOLTAREN) 1 % GEL Apply 2 g 4 times a day by topical route.     fluocinonide (LIDEX) 0.05 % external solution      fluticasone (FLONASE) 50 MCG/ACT nasal spray Place 2 sprays into both nostrils daily as needed for allergies.     fluticasone (FLOVENT HFA) 110 MCG/ACT inhaler 2 puff(s) Puff and swallow/ not for asthma, for EOE 2 times a day for 30 day(s)     folic acid (FOLVITE) 1 MG tablet TAKE 1 TABLET BY MOUTH DAILY. 30 tablet 1   gabapentin (NEURONTIN) 300 MG capsule Take 1 capsule twice a day by oral route.     hydrocortisone 2.5 % cream hydrocortisone 2.5 % topical cream  APPLY TO AFFECTED AREA TWICE A DAY FOR 14 DAYS     ketoconazole (NIZORAL) 2 % shampoo APPLY 5 MLS TO AFFECTED AREA 2 TO 3 TIMES A WEEK for 30     meloxicam (MOBIC) 15 MG tablet Take 1 tablet (15 mg total) by mouth daily as needed for pain. 30 tablet 1   Multiple Vitamins-Minerals (ADULT GUMMY PO) Take 2 capsules by mouth daily.     norethindrone-ethinyl estradiol (FEMHRT 1/5) 1-5 MG-MCG TABS tablet Take 1 tablet by mouth daily.     ondansetron (ZOFRAN-ODT) 8 MG disintegrating tablet      pantoprazole (PROTONIX) 40 MG tablet TAKE 1 TABLET BY MOUTH 2 TIMES DAILY. 180 tablet 1   SUMAtriptan (IMITREX) 100 MG tablet Take 1 tablet (100 mg total) by mouth every 2 (two) hours as needed for migraine. May repeat in 2 hours if headache persists or recurs.  15 tablet 1   thiamine (VITAMIN B-1) 100 MG tablet Take 1 tablet (100 mg total) by mouth daily. 30 tablet 0   traZODone (DESYREL) 50 MG tablet Take 50 mg by mouth at bedtime.     triamcinolone cream (KENALOG) 0.1 % APPLY TO AFFECTED AREA TWICE A DAY      Musculoskeletal: Strength & Muscle Tone: decreased Gait & Station: unsteady, unable to stand Patient leans: Right   Psychiatric Specialty Exam: Presentation  General Appearance:  Appropriate for Environment  Eye Contact: Fair  Speech: Clear and Coherent  Speech Volume: Normal  Handedness: Right   Mood and Affect  Mood: Euthymic  Affect: Appropriate   Thought Process  Thought Processes: Coherent  Descriptions of Associations:Intact  Orientation:Full (Time, Place and Person)  Thought Content:WDL  History of Schizophrenia/Schizoaffective disorder:No data recorded Duration of Psychotic Symptoms:No data recorded Hallucinations:Hallucinations: Auditory Description of Auditory Hallucinations: hears things when anxious, hears voices  Ideas of Reference:None  Suicidal Thoughts:Suicidal Thoughts: No  Homicidal Thoughts:Homicidal Thoughts: No   Sensorium  Memory: Immediate Fair; Remote Fair  Judgment: Fair  Insight: Fair   Executive Functions  Concentration: Good  Attention Span: Good  Recall: [place] of Knowledge: Fair  Language: Good   Psychomotor Activity  Psychomotor Activity: Psychomotor Activity: Tremor   Assets  Assets: Desire for Improvement; Communication Skills; Social Support; Housing    Sleep  Sleep: Sleep: Fair   Physical Exam: Physical Exam Pulmonary:     Effort: Pulmonary effort is normal.  Musculoskeletal:        General: Normal range of motion.  Neurological:     Mental Status: She is alert.  Psychiatric:        Attention and Perception: Attention  normal.        Mood and Affect: Mood is anxious.        Speech: Speech normal.        Behavior:  Behavior is cooperative.        Thought Content: Thought content normal.        Cognition and Memory: Cognition normal.    Review of Systems  HENT: Negative.    Musculoskeletal: Negative.   Skin: Negative.   Psychiatric/Behavioral:  The patient is nervous/anxious.    Blood pressure 102/69, pulse 87, temperature 98.3 F (36.8 C), temperature source Oral, resp. rate 16, height '5\' 2"'$  (1.575 m), weight 56.7 kg, SpO2 100 %. Body mass index is 22.86 kg/m.  Medical Decision Making: Will continuously observe overnight and reassess in the morning, and will see if TOC can assess for possible SNF or in-depth in home therapy.  Michaele Offer, PMHNP 02/26/2022 6:30 PM

## 2022-02-27 DIAGNOSIS — F411 Generalized anxiety disorder: Secondary | ICD-10-CM

## 2022-02-27 MED ORDER — SUMATRIPTAN SUCCINATE 50 MG PO TABS
100.0000 mg | ORAL_TABLET | Freq: Every day | ORAL | Status: DC
  Administered 2022-02-27: 100 mg via ORAL
  Filled 2022-02-27 (×2): qty 2

## 2022-02-27 MED ORDER — ADENOSINE 6 MG/2ML IV SOLN
6.0000 mg | Freq: Once | INTRAVENOUS | Status: AC
  Administered 2022-02-27: 6 mg via INTRAVENOUS
  Filled 2022-02-27: qty 2

## 2022-02-27 MED ORDER — SODIUM CHLORIDE 0.9 % IV BOLUS
1000.0000 mL | Freq: Once | INTRAVENOUS | Status: AC
  Administered 2022-02-27: 1000 mL via INTRAVENOUS

## 2022-02-27 NOTE — ED Notes (Signed)
Called pt contact Terri Ayala- she is going to try to arrange for transport through the home health agency, will call back

## 2022-02-27 NOTE — ED Notes (Signed)
Pt's vitals retaken. Pt's HR noted to be 140's on the dinamap. Pt's radial pulses were thready, and pt was profusely diaphoretic. Pt denies CP SOB. EKG captured and given to Dr. Sherry Ruffing. Charge nurse notified of need for room with cardiac monitoring equipment. Report given to [place] G. RN for further care.

## 2022-02-27 NOTE — ED Notes (Signed)
Cardioverted w/ 6 mg Adenosine, Tegler MD and respiratory at bedside. Per MD, patient is back in NSR.

## 2022-02-27 NOTE — ED Notes (Signed)
Given triptan, patient reports headache after cardioversion.

## 2022-02-27 NOTE — Progress Notes (Cosign Needed Addendum)
[facility] Psych ED Progress Note  02/27/2022 11:59 AM [name]  MRN:  [id number]   Principal Problem: GAD (generalized anxiety disorder) Diagnosis:  Principal Problem:   GAD (generalized anxiety disorder)   ED Assessment Time Calculation: Start Time: 1100 Stop Time: 1120 Total Time in Minutes (Assessment Completion): 20   Subjective: During evaluation Terri Ayala is in a room laying in her hospital bed, upon approach patient is alert and oriented x 3 in no acute distress.  Patient said "you just missed the fun, my heart stopped", today at 9:00 AM patient was cardioverted with 6 mg of adenosine and she is now back in normal sinus rhythm.  Patient says she feels fine, but has a slight headache.  Patient has been calm and cooperative denies SI/HI/AVH, currently denies hearing or seeing anything at this time.  Patient is speaking in a clear tone and moderate volume and normal pace, with good eye contact.  Her thought process is coherent and relevant, there is no indication that she is currently responding to internal/external stimuli or experiencing delusional thought content.  Patient has provider if she could be discharged, patient says she wants to go back home she has the support of [facility] to assist her.  Patient says she was able to eat better and sleep better since being in the hospital, patient says she is still having some anxiety only because her heart stopped and she had to be cardioverted, she says her anxiety is due to the chest pain that she experiences from time to time.     Past Psychiatric History: Diagnosis of anxiety, depression and panic disorder    [place] Scale:  [place] ED from 02/25/2022 in [place] DEPT ED to Hosp-Admission (Discharged) from 02/15/2022 in [place] ED to Hosp-Admission (Discharged) from 01/25/2022 in [place] No Risk No Risk No Risk       Past  Medical History:  Past Medical History:  Diagnosis Date   Alcohol withdrawal ([place]) 11/2017; 01/04/2018   Alcoholism ([place])    Anxiety    Arthritis    "hands" (01/04/2018)   ASYMPTOMATIC POSTMENOPAUSAL STATUS 08/17/2009   Chronic lower back pain    Depression    DYSPHAGIA 06/19/2007   Fatty liver, alcoholic    GERD (gastroesophageal reflux disease)    GOITER, MULTINODULAR 08/17/2009   Hyperlipidemia    Hypertension    Migraines    "not sure what triggers them; I'll have 1 q couple weeks or more; come 2 days in a row when they come" (01/04/2018)   Mitral valve prolapse    OSTEOPOROSIS 08/17/2009   PONV (postoperative nausea and vomiting)    Supraventricular tachycardia     Past Surgical History:  Procedure Laterality Date   BREAST BIOPSY Right    "benign"   CATARACT EXTRACTION W/ INTRAOCULAR LENS  IMPLANT, BILATERAL     CERVICAL CONE BIOPSY  2000s   CERVIX LESION DESTRUCTION  1991   "dysplasia; lesions"   CHOLECYSTECTOMY N/A 03/14/2013   Procedure: LAPAROSCOPIC CHOLECYSTECTOMY WITH ATTEMPTED INTRAOPERATIVE CHOLANGIOGRAM;  Surgeon: Harl Bowie, MD;  Location: [place];  Service: General;  Laterality: N/A;   HARDWARE REMOVAL Left 07/22/2021   Procedure: HARDWARE REMOVAL;  Surgeon: Altamese Woodburn, MD;  Location: [place];  Service: Orthopedics;  Laterality: Left;   laporoscopic abdominal surgery     "for endometriosis"   LEFT HEART CATH AND CORONARY ANGIOGRAPHY N/A 05/07/2018  Procedure: LEFT HEART CATH AND CORONARY ANGIOGRAPHY;  Surgeon: Belva Crome, MD;  Location: [place] CV LAB;  Service: Cardiovascular;  Laterality: N/A;   ORIF ANKLE FRACTURE Left 02/11/2021   Procedure: OPEN REDUCTION INTERNAL FIXATION (ORIF) ANKLE FRACTURE;  Surgeon: Altamese [place], MD;  Location: [place];  Service: Orthopedics;  Laterality: Left;   TONSILLECTOMY     Family History:  Family History  Problem Relation Age of Onset   Colon cancer Father        Deceased, 34   Osteoporosis Mother         Living, 43   Healthy Brother    Healthy Brother    Healthy Son    Healthy Son    Thyroid disease Neg Hx    Goiter Neg Hx     Social History:  Social History   Substance and Sexual Activity  Alcohol Use Not Currently   Alcohol/week: 28.0 standard drinks of alcohol   Types: 28 Glasses of wine per week   Comment: I had Detox on 07/13/21 and no alcohol since then     Social History   Substance and Sexual Activity  Drug Use Never    Social History   Socioeconomic History   Marital status: Single    Spouse name: Not on file   Number of children: Not on file   Years of education: Not on file   Highest education level: Not on file  Occupational History   Occupation: Brewing technologist - retired    Fish farm manager: [place] [employer]  Tobacco Use   Smoking status: Former    Packs/day: 1.00    Years: 7.00    Total pack years: 7.00    Types: Cigarettes    Quit date: 02/28/1974    Years since quitting: 48.0   Smokeless tobacco: Never   Tobacco comments:    01/04/2018 "smoked when I was a teenager"  Vaping Use   Vaping Use: Never used  Substance and Sexual Activity   Alcohol use: Not Currently    Alcohol/week: 28.0 standard drinks of alcohol    Types: 28 Glasses of wine per week    Comment: I had Detox on 07/13/21 and no alcohol since then   Drug use: Never   Sexual activity: Not Currently  Other Topics Concern   Not on file  Social History Narrative   Lives alone.  She has a BS biology.   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   Social Determinants of Health   Financial Resource Strain: Not on file  Food Insecurity: Not on file  Transportation Needs: Not on file  Physical Activity: Not on file  Stress: Not on file  Social Connections: Not on file    Sleep: Good  Appetite:  Good  Current Medications: Current Facility-Administered Medications  Medication Dose Route Frequency Provider Last Rate Last Admin   acetaminophen (TYLENOL) tablet 500  mg  500 mg Oral Q8H PRN Drenda Freeze, MD       baclofen (LIORESAL) tablet 5 mg  5 mg Oral BID Drenda Freeze, MD   5 mg at 02/27/22 1012   buPROPion ER [facility] SR) 12 hr tablet 100 mg  100 mg Oral BID Drenda Freeze, MD   100 mg at 02/26/22 2218   busPIRone (BUSPAR) tablet 15 mg  15 mg Oral TID Drenda Freeze, MD   15 mg at 02/27/22 1011   carvedilol (COREG) tablet 6.25 mg  6.25 mg Oral  BID WC Drenda Freeze, MD   6.25 mg at 02/27/22 1012   gabapentin (NEURONTIN) capsule 300 mg  300 mg Oral BID Drenda Freeze, MD   300 mg at 02/27/22 1012   LORazepam (ATIVAN) tablet 2 mg  2 mg Oral Q6H PRN Drenda Freeze, MD   2 mg at 02/26/22 0902   pantoprazole (PROTONIX) EC tablet 40 mg  40 mg Oral BID Drenda Freeze, MD   40 mg at 02/27/22 1012   polyethylene glycol (MIRALAX / GLYCOLAX) packet 17 g  17 g Oral Daily Drenda Freeze, MD       SUMAtriptan [facility]) tablet 100 mg  100 mg Oral Daily Cardama, Grayce Sessions, MD   100 mg at 02/27/22 1018   traZODone (DESYREL) tablet 50 mg  50 mg Oral QHS Drenda Freeze, MD   50 mg at 02/26/22 2207   Current Outpatient Medications  Medication Sig Dispense Refill   acetaminophen (TYLENOL) 500 MG tablet Take 500 mg by mouth every 8 (eight) hours as needed.     albuterol (VENTOLIN HFA) 108 (90 Base) MCG/ACT inhaler INHALE 2 PUFFS INTO THE LUNGS EVERY 6 HOURS AS NEEDED FOR WHEEZING OR SHORTNESS OF BREATH. (Patient taking differently: Inhale 2 puffs into the lungs every 6 (six) hours as needed for wheezing or shortness of breath.) 8.5 g 2   alendronate (FOSAMAX) 70 MG tablet Take 70 mg by mouth every Monday.     ampicillin (PRINCIPEN) 500 MG capsule Take 500 mg by mouth daily.     Azelaic Acid 15 % gel      baclofen 5 MG TABS Take 5 mg by mouth 2 (two) times daily. 60 each 0   buPROPion ER (WELLBUTRIN SR) 100 MG 12 hr tablet Take 1 tablet (100 mg total) by mouth 2 (two) times daily. 60 tablet 2   busPIRone (BUSPAR) 15 MG tablet  Take 1 tablet (15 mg total) by mouth 3 (three) times daily. 90 tablet 2   Calcium Carbonate-Vitamin D (CALCIUM 600+D PO) Take 1 tablet by mouth daily.     carvedilol (COREG) 6.25 MG tablet Take 1 tablet (6.25 mg total) by mouth 2 (two) times daily with a meal. 180 tablet 1   CLENPIQ 10-3.5-12 MG-GM -GM/175ML SOLN SMARTSIG:175 Milliliter(s) By Mouth Twice a Week     clindamycin (CLINDAGEL) 1 % gel      Dermatological Products, Misc. (DERMAREST ROSACEA EX) Apply topically Once daily for 30     diclofenac Sodium (VOLTAREN) 1 % GEL Apply 2 g 4 times a day by topical route.     fluocinonide (LIDEX) 0.05 % external solution      fluticasone (FLONASE) 50 MCG/ACT nasal spray Place 2 sprays into both nostrils daily as needed for allergies.     fluticasone (FLOVENT HFA) 110 MCG/ACT inhaler 2 puff(s) Puff and swallow/ not for asthma, for EOE 2 times a day for 30 day(s)     folic acid (FOLVITE) 1 MG tablet TAKE 1 TABLET BY MOUTH DAILY. 30 tablet 1   gabapentin (NEURONTIN) 300 MG capsule Take 1 capsule twice a day by oral route.     hydrocortisone 2.5 % cream hydrocortisone 2.5 % topical cream  APPLY TO AFFECTED AREA TWICE A DAY FOR 14 DAYS     ketoconazole (NIZORAL) 2 % shampoo APPLY 5 MLS TO AFFECTED AREA 2 TO 3 TIMES A WEEK for 30     meloxicam (MOBIC) 15 MG tablet Take 1 tablet (15 mg total) by  mouth daily as needed for pain. 30 tablet 1   Multiple Vitamins-Minerals (ADULT GUMMY PO) Take 2 capsules by mouth daily.     norethindrone-ethinyl estradiol (FEMHRT 1/5) 1-5 MG-MCG TABS tablet Take 1 tablet by mouth daily.     ondansetron (ZOFRAN-ODT) 8 MG disintegrating tablet      pantoprazole (PROTONIX) 40 MG tablet TAKE 1 TABLET BY MOUTH 2 TIMES DAILY. 180 tablet 1   SUMAtriptan (IMITREX) 100 MG tablet Take 1 tablet (100 mg total) by mouth every 2 (two) hours as needed for migraine. May repeat in 2 hours if headache persists or recurs. 15 tablet 1   thiamine (VITAMIN B-1) 100 MG tablet Take 1 tablet (100 mg  total) by mouth daily. 30 tablet 0   traZODone (DESYREL) 50 MG tablet Take 50 mg by mouth at bedtime.     triamcinolone cream (KENALOG) 0.1 % APPLY TO AFFECTED AREA TWICE A DAY      Lab Results:  Results for orders placed or performed during the hospital encounter of 02/25/22 (from the past 48 hour(s))  CBC with Differential     Status: Abnormal   Collection Time: 02/25/22  8:39 PM  Result Value Ref Range   WBC 8.0 4.0 - 10.5 K/uL   RBC 5.28 (H) 3.87 - 5.11 MIL/uL   Hemoglobin 16.7 (H) 12.0 - 15.0 g/dL   HCT 47.3 (H) 36.0 - 46.0 %   MCV 89.6 80.0 - 100.0 fL   MCH 31.6 26.0 - 34.0 pg   MCHC 35.3 30.0 - 36.0 g/dL   RDW 13.5 11.5 - 15.5 %   Platelets 298 150 - 400 K/uL   nRBC 0.0 0.0 - 0.2 %   Neutrophils Relative % 54 %   Neutro Abs 4.3 1.7 - 7.7 K/uL   Lymphocytes Relative 34 %   Lymphs Abs 2.8 0.7 - 4.0 K/uL   Monocytes Relative 9 %   Monocytes Absolute 0.8 0.1 - 1.0 K/uL   Eosinophils Relative 1 %   Eosinophils Absolute 0.1 0.0 - 0.5 K/uL   Basophils Relative 1 %   Basophils Absolute 0.1 0.0 - 0.1 K/uL   Immature Granulocytes 1 %   Abs Immature Granulocytes 0.04 0.00 - 0.07 K/uL    Comment: Performed at [facility], [place] [address] [place], [place] [address]  Comprehensive metabolic panel     Status: Abnormal   Collection Time: 02/25/22  8:39 PM  Result Value Ref Range   Sodium 141 135 - 145 mmol/L   Potassium 3.4 (L) 3.5 - 5.1 mmol/L   Chloride 106 98 - 111 mmol/L   CO2 20 (L) 22 - 32 mmol/L   Glucose, Bld 98 70 - 99 mg/dL    Comment: Glucose reference range applies only to samples taken after fasting for at least 8 hours.   BUN 15 8 - 23 mg/dL   Creatinine, Ser 0.97 0.44 - 1.00 mg/dL   Calcium 10.2 8.9 - 10.3 mg/dL   Total Protein 8.5 (H) 6.5 - 8.1 g/dL   Albumin 4.5 3.5 - 5.0 g/dL   AST 27 15 - 41 U/L   ALT 26 0 - 44 U/L   Alkaline Phosphatase 59 38 - 126 U/L   Total Bilirubin 0.8 0.3 - 1.2 mg/dL   GFR, Estimated >60 >60 mL/min    Comment:  (NOTE) Calculated using the CKD-EPI Creatinine Equation (2021)    Anion gap 15 5 - 15    Comment: Performed at [facility], [address] [place], [place] [address]  Ethanol     Status: None   Collection Time: 02/25/22  8:39 PM  Result Value Ref Range   Alcohol, Ethyl (B) <10 <10 mg/dL    Comment: (NOTE) Lowest detectable limit for serum alcohol is 10 mg/dL.  For medical purposes only. Performed at [facility], [place] [address], [place], [place] [address]     Blood Alcohol level:  Lab Results  Component Value Date   [facility] <10 02/25/2022   ETH <10 02/16/2022    Physical Findings:  CIWA:    COWS:     Musculoskeletal: Strength & Muscle Tone: decreased Gait & Station: unsteady Patient leans: Front  Psychiatric Specialty Exam:  Presentation  General Appearance:  Appropriate for Environment  Eye Contact: Fair  Speech: Clear and Coherent  Speech Volume: Normal  Handedness: Right   Mood and Affect  Mood: Euthymic  Affect: Appropriate   Thought Process  Thought Processes: Coherent  Descriptions of Associations:Intact  Orientation:Full (Time, Place and Person)  Thought Content:WDL  History of Schizophrenia/Schizoaffective disorder:No data recorded Duration of Psychotic Symptoms:No data recorded Hallucinations:Hallucinations: None Description of Auditory Hallucinations: hears things when anxious, hears voices  Ideas of Reference:None  Suicidal Thoughts:Suicidal Thoughts: No  Homicidal Thoughts:Homicidal Thoughts: No   Sensorium  Memory: Immediate Fair; Remote Fair  Judgment: Fair  Insight: Fair   Community education officer  Concentration: Good  Attention Span: Good  Recall: [place] of Knowledge: Fair  Language: Good   Psychomotor Activity  Psychomotor Activity: Psychomotor Activity: Normal   Assets  Assets: Desire for Improvement; Financial Resources/Insurance; Housing; Social  Support   Sleep  Sleep: Sleep: Good    Physical Exam: Physical Exam Eyes:     Pupils: Pupils are equal, round, and reactive to light.  Pulmonary:     Effort: Pulmonary effort is normal.  Musculoskeletal:        General: Tenderness and deformity present. Normal range of motion.  Skin:    General: Skin is warm.  Neurological:     Mental Status: She is alert and oriented to person, place, and time.     Motor: Weakness present.  Psychiatric:        Mood and Affect: Mood normal.        Behavior: Behavior normal.        Thought Content: Thought content normal.        Judgment: Judgment normal.    Review of Systems  Constitutional: Negative.   Eyes: Negative.   Musculoskeletal: Negative.   Psychiatric/Behavioral: Negative.     Blood pressure 114/72, pulse 83, temperature 98.5 F (36.9 C), temperature source Oral, resp. rate 11, height '5\' 2"'$  (1.575 m), weight 56.7 kg, SpO2 95 %. Body mass index is 22.86 kg/m.   Medical Decision Making: No evidence of imminent risk to self or others at present.   Patient does not meet criteria for psychiatric inpatient admission. Supportive therapy provided about ongoing stressors. Discussed crisis plan, support from social network, calling 911, coming to the Emergency Department, and calling Suicide Hotline. Patient is Psych Cleared.    Margel Joens MOTLEY-MANGRUM, PMHNP 02/27/2022, 11:59 AM

## 2022-02-27 NOTE — Discharge Instructions (Signed)
Your history, exam and workup today led [place] to determine you were in SVT, a fast arrhythmia.  We are able to get you out of SVT the with the medications.  You have proven stability in a normal rhythm for several hours and we feel you are safe for discharge home from a medical standpoint the psychiatry team felt you are also safe for discharge home.  Please follow-up with your outpatient psychiatry team and rest and stay hydrated.  Please follow-up with cardiology for the arrhythmias.  If any symptoms change or worsen, please return to the nearest emergency department.

## 2022-02-27 NOTE — ED Provider Notes (Signed)
Emergency Medicine Observation Re-evaluation Note  Terri Ayala is a 66 y.o. female, seen on rounds today.  Pt initially presented to the ED for complaints of Altered Mental Status Currently, the patient is awaiting reassessment by psychiatry in the morning.  Physical Exam  BP 108/66 (BP Location: Right Arm)   Pulse (!) 135   Temp 98.9 F (37.2 C) (Oral)   Resp 16   Ht '5\' 2"'$  (1.575 m)   Wt 56.7 kg   SpO2 98%   BMI 22.86 kg/m  Physical Exam General: Resting Cardiac: Feeling palpitations and fast heart rate that she has felt for a long time and feels similar to previous SVT Lungs: Clear Psych: No agitation  ED Course / MDM  EKG:  EKG Interpretation  Date/Time:  Sunday February 27 2022 09:18:30 EST Ventricular Rate:  95 PR Interval:  140 QRS Duration: 82 QT Interval:  386 QTC Calculation: 486 R Axis:   41 Text Interpretation: Sinus rhythm Abnormal R-wave progression, early transition Borderline prolonged QT interval when compared to ECG earlier this AM, now sinus from SVT. No STEMI Confirmed by Antony Blackbird [phone]) on 02/27/2022 9:30:02 AM   EKG Interpretation  Date/Time:  Sunday February 27 2022 08:22:35 EST Ventricular Rate:  141 PR Interval:    QRS Duration: 74 QT Interval:  306 QTC Calculation: 468 R Axis:   50 Text Interpretation: Supraventricular tachycardia Nonspecific ST abnormality Abnormal ECG When compared with ECG of 15-Feb-2022 19:59, PREVIOUS ECG IS PRESENT when compared to prior, now appears to show SVT vs sinus tachy  with 1st degree block. No STEMI Confirmed by Antony Blackbird [phone]) on 02/27/2022 8:34:55 AM   I have reviewed the labs performed to date as well as medications administered while in observation.  Recent changes in the last 24 hours include this morning patient was found to have fast heart rate.  EKG was obtained and patient appears to be in SVT.  A vagal maneuver was attempted by me without success.  We then were able to successfully  convert back to sinus rhythm with 1 dose of adenosine.  Patient is feeling better now.  Will continue liter of fluids and await further psychiatric recommendations.  Plan  Current plan is for awaiting for the psychiatric recommendations now that her SVT has been terminated and she is feeling better.  At this time she will remain medically clear for psychiatric recommendations after her fluids are completed.   Psychiatry felt she is now psychiatrically clear and safe for discharge home.  Patient has remained out of SVT for several hours and is well-appearing.  She will follow-up with outpatient cardiology and PCP and outpatient psychiatry.  She no other questions or concerns and was discharged in good condition.  Clinical Impression: 1. Agitation   2. Anxiety   3. SVT (supraventricular tachycardia)     Disposition: Discharge  Condition: Good  I have discussed the results, Dx and Tx plan with the pt(& family if present). He/she/they expressed understanding and agree(s) with the plan. Discharge instructions discussed at great length. Strict return precautions discussed and pt &/or family have verbalized understanding of the instructions. No further questions at time of discharge.    New Prescriptions   No medications on file    Follow Up: [place] [facility] [id number] [place] [facility] [place] [id number] [place] [phone]  Prabhleen Montemayor, Gwenyth Allegra, MD 02/27/22 [phone]

## 2022-02-27 NOTE — ED Notes (Signed)
Terri Ayala remains asleep no signs of pain or distress respirations remain easy skin color appropriate for he

## 2022-03-01 ENCOUNTER — Other Ambulatory Visit: Payer: Self-pay | Admitting: Nurse Practitioner

## 2022-03-01 DIAGNOSIS — G8929 Other chronic pain: Secondary | ICD-10-CM

## 2022-03-01 DIAGNOSIS — F1020 Alcohol dependence, uncomplicated: Secondary | ICD-10-CM

## 2022-03-03 ENCOUNTER — Encounter (HOSPITAL_COMMUNITY): Payer: Self-pay

## 2022-03-03 ENCOUNTER — Telehealth (HOSPITAL_COMMUNITY): Payer: Self-pay | Admitting: Medical

## 2022-03-03 ENCOUNTER — Telehealth (HOSPITAL_BASED_OUTPATIENT_CLINIC_OR_DEPARTMENT_OTHER): Payer: Self-pay | Admitting: Medical

## 2022-03-03 ENCOUNTER — Encounter (HOSPITAL_COMMUNITY): Payer: Self-pay | Admitting: Medical

## 2022-03-03 DIAGNOSIS — Z9889 Other specified postprocedural states: Secondary | ICD-10-CM

## 2022-03-03 DIAGNOSIS — E8889 Other specified metabolic disorders: Secondary | ICD-10-CM

## 2022-03-03 DIAGNOSIS — R945 Abnormal results of liver function studies: Secondary | ICD-10-CM

## 2022-03-03 DIAGNOSIS — F1210 Cannabis abuse, uncomplicated: Secondary | ICD-10-CM

## 2022-03-03 DIAGNOSIS — Z79899 Other long term (current) drug therapy: Secondary | ICD-10-CM

## 2022-03-03 DIAGNOSIS — R413 Other amnesia: Secondary | ICD-10-CM

## 2022-03-03 DIAGNOSIS — F4312 Post-traumatic stress disorder, chronic: Secondary | ICD-10-CM

## 2022-03-03 DIAGNOSIS — Z8669 Personal history of other diseases of the nervous system and sense organs: Secondary | ICD-10-CM

## 2022-03-03 DIAGNOSIS — L718 Other rosacea: Secondary | ICD-10-CM

## 2022-03-03 DIAGNOSIS — Z9189 Other specified personal risk factors, not elsewhere classified: Secondary | ICD-10-CM

## 2022-03-03 DIAGNOSIS — Z789 Other specified health status: Secondary | ICD-10-CM

## 2022-03-03 DIAGNOSIS — Z8659 Personal history of other mental and behavioral disorders: Secondary | ICD-10-CM

## 2022-03-03 DIAGNOSIS — T50905D Adverse effect of unspecified drugs, medicaments and biological substances, subsequent encounter: Secondary | ICD-10-CM

## 2022-03-03 DIAGNOSIS — Z8781 Personal history of (healed) traumatic fracture: Secondary | ICD-10-CM

## 2022-03-03 DIAGNOSIS — F1029 Alcohol dependence with unspecified alcohol-induced disorder: Secondary | ICD-10-CM

## 2022-03-03 DIAGNOSIS — F1021 Alcohol dependence, in remission: Secondary | ICD-10-CM

## 2022-03-03 DIAGNOSIS — F1020 Alcohol dependence, uncomplicated: Secondary | ICD-10-CM

## 2022-03-03 DIAGNOSIS — Z7409 Other reduced mobility: Secondary | ICD-10-CM

## 2022-03-03 DIAGNOSIS — Z91199 Patient's noncompliance with other medical treatment and regimen due to unspecified reason: Secondary | ICD-10-CM

## 2022-03-03 DIAGNOSIS — T1490XA Injury, unspecified, initial encounter: Secondary | ICD-10-CM
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
## 2022-03-05 ENCOUNTER — Other Ambulatory Visit: Payer: Self-pay | Admitting: Nurse Practitioner

## 2022-03-05 ENCOUNTER — Other Ambulatory Visit (HOSPITAL_COMMUNITY): Payer: Self-pay | Admitting: Medical

## 2022-03-05 DIAGNOSIS — G8929 Other chronic pain: Secondary | ICD-10-CM

## 2022-03-05 DIAGNOSIS — F1020 Alcohol dependence, uncomplicated: Secondary | ICD-10-CM

## 2022-03-09 NOTE — Telephone Encounter (Signed)
Has appt

## 2022-03-10 ENCOUNTER — Ambulatory Visit (HOSPITAL_COMMUNITY): Payer: Medicare Other | Admitting: Medical

## 2022-03-10 ENCOUNTER — Telehealth: Payer: Self-pay | Admitting: Nurse Practitioner

## 2022-03-10 DIAGNOSIS — F1020 Alcohol dependence, uncomplicated: Secondary | ICD-10-CM

## 2022-03-10 DIAGNOSIS — G8929 Other chronic pain: Secondary | ICD-10-CM

## 2022-03-10 NOTE — Telephone Encounter (Signed)
Called pt to FU No answer. Cell mailbox full Home phone left VM. Care Everywhere notes Document 03/03/2022 with Dr Calton Dach but  system failed to load records. No appts in Patient Station today Refilled her Baclofen yesterday

## 2022-03-11 ENCOUNTER — Ambulatory Visit (HOSPITAL_COMMUNITY): Payer: Medicare Other | Admitting: Medical

## 2022-03-14 ENCOUNTER — Ambulatory Visit: Payer: Medicare Other | Attending: Cardiology | Admitting: Cardiology

## 2022-03-23 ENCOUNTER — Other Ambulatory Visit: Payer: Self-pay | Admitting: Nurse Practitioner

## 2022-03-23 DIAGNOSIS — E7800 Pure hypercholesterolemia, unspecified: Secondary | ICD-10-CM

## 2022-03-23 DIAGNOSIS — K760 Fatty (change of) liver, not elsewhere classified: Secondary | ICD-10-CM

## 2022-03-23 DIAGNOSIS — I10 Essential (primary) hypertension: Secondary | ICD-10-CM

## 2022-03-24 ENCOUNTER — Other Ambulatory Visit: Payer: Self-pay | Admitting: Nurse Practitioner

## 2022-03-24 DIAGNOSIS — F1020 Alcohol dependence, uncomplicated: Secondary | ICD-10-CM

## 2022-03-24 MED ORDER — GABAPENTIN 300 MG PO CAPS: 300.0000 mg | ORAL_CAPSULE | Freq: Two times a day (BID) | ORAL | 2 refills | Status: DC

## 2022-03-24 NOTE — Telephone Encounter (Signed)
Hey. I reviewed her meds and med list after discharge from hospital. She should be taking gabapentin 300 mg twice daily. I refilled that dose and sent new prescription to [facility].

## 2022-03-30 ENCOUNTER — Other Ambulatory Visit: Payer: Self-pay | Admitting: Nurse Practitioner

## 2022-03-30 DIAGNOSIS — F1020 Alcohol dependence, uncomplicated: Secondary | ICD-10-CM

## 2022-04-21 ENCOUNTER — Telehealth (HOSPITAL_COMMUNITY): Payer: Medicare Other | Admitting: Medical

## 2022-04-21 ENCOUNTER — Encounter (HOSPITAL_COMMUNITY): Payer: Self-pay | Admitting: Medical

## 2022-04-21 ENCOUNTER — Telehealth (HOSPITAL_BASED_OUTPATIENT_CLINIC_OR_DEPARTMENT_OTHER): Payer: Medicare Other | Admitting: Medical

## 2022-04-21 DIAGNOSIS — F1021 Alcohol dependence, in remission: Secondary | ICD-10-CM

## 2022-04-21 DIAGNOSIS — Z8659 Personal history of other mental and behavioral disorders: Secondary | ICD-10-CM

## 2022-04-21 DIAGNOSIS — F1210 Cannabis abuse, uncomplicated: Secondary | ICD-10-CM | POA: Diagnosis not present

## 2022-04-21 DIAGNOSIS — T1490XA Injury, unspecified, initial encounter: Secondary | ICD-10-CM | POA: Diagnosis not present

## 2022-04-21 DIAGNOSIS — Z9189 Other specified personal risk factors, not elsewhere classified: Secondary | ICD-10-CM

## 2022-04-21 DIAGNOSIS — Z79899 Other long term (current) drug therapy: Secondary | ICD-10-CM

## 2022-04-21 DIAGNOSIS — R945 Abnormal results of liver function studies: Secondary | ICD-10-CM

## 2022-04-21 DIAGNOSIS — F1029 Alcohol dependence with unspecified alcohol-induced disorder: Secondary | ICD-10-CM

## 2022-04-21 DIAGNOSIS — Z8781 Personal history of (healed) traumatic fracture: Secondary | ICD-10-CM

## 2022-04-21 DIAGNOSIS — E8889 Other specified metabolic disorders: Secondary | ICD-10-CM

## 2022-04-21 DIAGNOSIS — G928 Other toxic encephalopathy: Secondary | ICD-10-CM

## 2022-04-21 DIAGNOSIS — F4312 Post-traumatic stress disorder, chronic: Secondary | ICD-10-CM

## 2022-04-21 DIAGNOSIS — Z8669 Personal history of other diseases of the nervous system and sense organs: Secondary | ICD-10-CM

## 2022-04-21 DIAGNOSIS — L718 Other rosacea: Secondary | ICD-10-CM

## 2022-04-21 DIAGNOSIS — Z9889 Other specified postprocedural states: Secondary | ICD-10-CM

## 2022-04-21 MED ORDER — BUPROPION HCL ER (XL) 150 MG PO TB24: 150.0000 mg | ORAL_TABLET | ORAL | 0 refills | Status: DC

## 2022-04-21 MED ORDER — TRAZODONE HCL 50 MG PO TABS: 50.0000 mg | ORAL_TABLET | Freq: Every day | ORAL | 0 refills | Status: DC
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
## 2022-04-22 ENCOUNTER — Other Ambulatory Visit: Payer: Medicare Other

## 2022-04-22 ENCOUNTER — Encounter: Payer: Self-pay | Admitting: Cardiovascular Disease

## 2022-04-22 ENCOUNTER — Ambulatory Visit: Payer: Medicare Other | Attending: Cardiovascular Disease | Admitting: Cardiovascular Disease

## 2022-04-22 VITALS — BP 128/70 | HR 72 | Ht 62.0 in | Wt 120.2 lb

## 2022-04-22 DIAGNOSIS — E7800 Pure hypercholesterolemia, unspecified: Secondary | ICD-10-CM

## 2022-04-22 DIAGNOSIS — I10 Essential (primary) hypertension: Secondary | ICD-10-CM

## 2022-04-22 DIAGNOSIS — K760 Fatty (change of) liver, not elsewhere classified: Secondary | ICD-10-CM

## 2022-04-22 DIAGNOSIS — I471 Supraventricular tachycardia, unspecified: Secondary | ICD-10-CM | POA: Diagnosis not present

## 2022-04-22 NOTE — Progress Notes (Signed)
Chief Complaint  Patient presents with   New Patient (Initial Visit)    SVT   History of Present Illness: 67 yo female white female with history of SVT, anxiety and mitral valve prolapse here today for follow up. She was seen as a new patient in our office in 2012 but has not been seen here since 2013. She was seen in the [facility] ED 11/14/10 with c/o palpitations. EKG with SVT with rate of 178 bpm. She converted to sinus with IV adenosine. She was started on atenolol but did not tolerate this. Cardiac monitor in 2012 showed sinus tachycardia with no evidence of atrial fibrillation, SVT or VT.  Nuclear stress test in May 2023 with no evidence of ischemia with normal LV function. Echo October 2012 with normal LV size and function. No valve disease. Thyroid profile normal per primary care in August 2013. She was seen in 2014 in the EP clinic by Dr. Lovena Le and no changes made. She was admitted to [facility] in March 2020 with chest pain. cardiac catheterization March 2020 with no CAD. Echo in 2022 with normal LV function and no valve disease.   She tells me today that she feels well overall. She has been sober since May of 2023. She denies chest pain, dyspnea or palpitations. No LE edema.   Primary Care Physician: Velva Harman, PA   Past Medical History:  Diagnosis Date   Alcohol withdrawal ([place]) 11/2017; 01/04/2018   Alcoholism ([place])    Anxiety    Arthritis    "hands" (01/04/2018)   ASYMPTOMATIC POSTMENOPAUSAL STATUS 08/17/2009   Chronic lower back pain    Depression    DYSPHAGIA 06/19/2007   Fatty liver, alcoholic    GERD (gastroesophageal reflux disease)    GOITER, MULTINODULAR 08/17/2009   Hyperlipidemia    Hypertension    Migraines    "not sure what triggers them; I'll have 1 q couple weeks or more; come 2 days in a row when they come" (01/04/2018)   Mitral valve prolapse    OSTEOPOROSIS 08/17/2009   PONV (postoperative nausea and vomiting)    Supraventricular tachycardia      Past Surgical History:  Procedure Laterality Date   BREAST BIOPSY Right    "benign"   CATARACT EXTRACTION W/ INTRAOCULAR LENS  IMPLANT, BILATERAL     CERVICAL CONE BIOPSY  2000s   CERVIX LESION DESTRUCTION  1991   "dysplasia; lesions"   CHOLECYSTECTOMY N/A 03/14/2013   Procedure: LAPAROSCOPIC CHOLECYSTECTOMY WITH ATTEMPTED INTRAOPERATIVE CHOLANGIOGRAM;  Surgeon: Harl Bowie, MD;  Location: [place];  Service: General;  Laterality: N/A;   HARDWARE REMOVAL Left 07/22/2021   Procedure: HARDWARE REMOVAL;  Surgeon: Altamese Salem, MD;  Location: [place];  Service: Orthopedics;  Laterality: Left;   laporoscopic abdominal surgery     "for endometriosis"   LEFT HEART CATH AND CORONARY ANGIOGRAPHY N/A 05/07/2018   Procedure: LEFT HEART CATH AND CORONARY ANGIOGRAPHY;  Surgeon: Belva Crome, MD;  Location: [place] [facility];  Service: Cardiovascular;  Laterality: N/A;   ORIF ANKLE FRACTURE Left 02/11/2021   Procedure: OPEN REDUCTION INTERNAL FIXATION (ORIF) ANKLE FRACTURE;  Surgeon: Altamese [place], MD;  Location: [place];  Service: Orthopedics;  Laterality: Left;   TONSILLECTOMY      Current Outpatient Medications  Medication Sig Dispense Refill   albuterol (VENTOLIN HFA) 108 (90 Base) MCG/ACT inhaler INHALE 2 PUFFS INTO THE LUNGS EVERY 6 HOURS AS NEEDED FOR WHEEZING OR SHORTNESS OF BREATH. (Patient taking differently: Inhale  2 puffs into the lungs every 6 (six) hours as needed for wheezing or shortness of breath.) 8.5 g 2   alendronate (FOSAMAX) 70 MG tablet Take 70 mg by mouth every Monday.     ampicillin (PRINCIPEN) 500 MG capsule Take 500 mg by mouth daily.     Azelaic Acid 15 % gel      baclofen (LIORESAL) 10 MG tablet TAKE 1 TABLET BY MOUTH 3 TIMES DAILY. 270 tablet 0   buPROPion (WELLBUTRIN XL) 150 MG 24 hr tablet Take 1 tablet (150 mg total) by mouth every morning. 90 tablet 0   busPIRone (BUSPAR) 15 MG tablet Take 1 tablet (15 mg total) by mouth 3 (three) times daily. 90 tablet 2    Calcium Carbonate-Vitamin D (CALCIUM 600+D PO) Take 1 tablet by mouth daily.     carvedilol (COREG) 6.25 MG tablet Take 1 tablet (6.25 mg total) by mouth 2 (two) times daily with a meal. 180 tablet 1   CLENPIQ 10-3.5-12 MG-GM -GM/175ML SOLN SMARTSIG:175 Milliliter(s) By Mouth Twice a Week     clindamycin (CLINDAGEL) 1 % gel      diclofenac Sodium (VOLTAREN) 1 % GEL Apply 2 g 4 times a day by topical route.     fluocinonide (LIDEX) 0.05 % external solution      fluticasone (FLONASE) 50 MCG/ACT nasal spray Place 2 sprays into both nostrils daily as needed for allergies.     fluticasone (FLOVENT HFA) 110 MCG/ACT inhaler 2 puff(s) Puff and swallow/ not for asthma, for EOE 2 times a day for 30 day(s)     folic acid (FOLVITE) 1 MG tablet TAKE 1 TABLET BY MOUTH DAILY. 30 tablet 1   gabapentin (NEURONTIN) 300 MG capsule Take 1 capsule (300 mg total) by mouth 2 (two) times daily. 60 capsule 2   ketoconazole (NIZORAL) 2 % shampoo APPLY 5 MLS TO AFFECTED AREA 2 TO 3 TIMES A WEEK for 30     meloxicam (MOBIC) 15 MG tablet Take 1 tablet (15 mg total) by mouth daily as needed for pain. 30 tablet 1   Multiple Vitamins-Minerals (ADULT GUMMY PO) Take 2 capsules by mouth daily.     norethindrone-ethinyl estradiol (FEMHRT 1/5) 1-5 MG-MCG TABS tablet Take 1 tablet by mouth daily.     pantoprazole (PROTONIX) 40 MG tablet TAKE 1 TABLET BY MOUTH 2 TIMES DAILY. 180 tablet 0   SUMAtriptan (IMITREX) 100 MG tablet Take 1 tablet (100 mg total) by mouth every 2 (two) hours as needed for migraine. May repeat in 2 hours if headache persists or recurs. 15 tablet 1   thiamine (VITAMIN B-1) 100 MG tablet Take 1 tablet (100 mg total) by mouth daily. 30 tablet 0   traZODone (DESYREL) 50 MG tablet Take 1 tablet (50 mg total) by mouth at bedtime. 90 tablet 0   tretinoin (RETIN-A) Q000111Q % cream 1 application in the evening to face Externally Once a day, PM pea size amount for 30 days     No current facility-administered medications  for this visit.    Allergies  Allergen Reactions   Doxycycline Swelling    Mouth swelling and sores  Other Reaction(s): Not available   Codeine Itching    Other Reaction(s): Not available   Sulfa Antibiotics     Possibly??   Tetracycline Hcl Swelling    Mouth swelling and sores    Social History   Socioeconomic History   Marital status: Single    Spouse name: Not on file   Number  of children: Not on file   Years of education: Not on file   Highest education level: Not on file  Occupational History   Occupation: Brewing technologist - retired    Fish farm manager: [employer]  Tobacco Use   Smoking status: Former    Packs/day: 1.00    Years: 7.00    Total pack years: 7.00    Types: Cigarettes    Quit date: 02/28/1974    Years since quitting: 48.1   Smokeless tobacco: Never   Tobacco comments:    01/04/2018 "smoked when I was a teenager"  Vaping Use   Vaping Use: Never used  Substance and Sexual Activity   Alcohol use: Not Currently    Alcohol/week: 28.0 standard drinks of alcohol    Types: 28 Glasses of wine per week    Comment: I had Detox on 07/13/21 and no alcohol since then   Drug use: Never   Sexual activity: Not Currently  Other Topics Concern   Not on file  Social History Narrative   Lives alone.  She has a BS biology.   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   Social Determinants of Health   Financial Resource Strain: Not on file  Food Insecurity: Not on file  Transportation Needs: Not on file  Physical Activity: Not on file  Stress: Not on file  Social Connections: Not on file  Intimate Partner Violence: Not on file    Family History  Problem Relation Age of Onset   Colon cancer Father        Deceased, 3   Osteoporosis Mother        Living, 16   Healthy Brother    Healthy Brother    Healthy Son    Healthy Son    Thyroid disease Neg Hx    Goiter Neg Hx     Review of Systems:  As stated in the HPI and otherwise  negative.   BP 128/70   Pulse 72   Ht '5\' 2"'$  (1.575 m)   Wt 54.5 kg   SpO2 99%   BMI 21.98 kg/m   Physical Examination: General: Well developed, well nourished, NAD  HEENT: OP clear, mucus membranes moist  SKIN: warm, dry. No rashes. Neuro: No focal deficits  Musculoskeletal: Muscle strength 5/5 all ext  Psychiatric: Mood and affect normal  Neck: No JVD, no carotid bruits, no thyromegaly, no lymphadenopathy.  Lungs:Clear bilaterally, no wheezes, rhonci, crackles Cardiovascular: Regular rate and rhythm. No murmurs, gallops or rubs. Abdomen:Soft. Bowel sounds present. Non-tender.  Extremities: No lower extremity edema. Pulses are 2 + in the bilateral DP/PT.  EKG:  EKG is ordered today. The ekg ordered today demonstrates sinus.   Recent Labs: 02/16/2022: TSH 3.018 02/24/2022: Magnesium 2.0 02/25/2022: ALT 26; BUN 15; Creatinine, Ser 0.97; Hemoglobin 16.7; Platelets 298; Potassium 3.4; Sodium 141   Lipid Panel    Component Value Date/Time   CHOL 234 (H) 12/15/2021 1001   TRIG 103 12/15/2021 1001   HDL 48 12/15/2021 1001   CHOLHDL 4.9 (H) 12/15/2021 1001   CHOLHDL 1.8 10/04/2012 1213   VLDL 10 10/04/2012 1213   LDLCALC 168 (H) 12/15/2021 1001     Wt Readings from Last 3 Encounters:  04/22/22 54.5 kg  02/25/22 56.7 kg  02/17/22 56.3 kg    Assessment and Plan:   1.  History of SVT: No recent palpitations. Continue Coreg.   Labs/ tests ordered today include:   Orders Placed This Encounter  Procedures   EKG 12-Lead   Disposition:   F/U with me in one year   Signed, Lauree Chandler, MD, [facility] 04/22/2022 10:21 AM    [facility] [address], [place], [place]  [address] Phone: [phone]; Fax: [phone]

## 2022-04-22 NOTE — Patient Instructions (Addendum)
Medication Instructions:  No changes *If you need a refill on your cardiac medications before your next appointment, please call your pharmacy*   Lab Work: none If you have labs (blood work) drawn today and your tests are completely normal, you will receive your results only by: MyChart Message (if you have MyChart) OR A paper copy in the mail If you have any lab test that is abnormal or we need to change your treatment, we will call you to review the results.   Testing/Procedures: none   Follow-Up: At  HeartCare, you and your health needs are our priority.  As part of our continuing mission to provide you with exceptional heart care, we have created designated Provider Care Teams.  These Care Teams include your primary Cardiologist (physician) and Advanced Practice Providers (APPs -  Physician Assistants and Nurse Practitioners) who all work together to provide you with the care you need, when you need it.   Your next appointment:   12 month(s)  Provider:   Christopher McAlhany, MD      

## 2022-04-23 LAB — COMPREHENSIVE METABOLIC PANEL
ALT: 10 IU/L (ref 0–32)
AST: 15 IU/L (ref 0–40)
Albumin/Globulin Ratio: 2 (ref 1.2–2.2)
Albumin: 4.1 g/dL (ref 3.9–4.9)
Alkaline Phosphatase: 48 IU/L (ref 44–121)
BUN/Creatinine Ratio: 14 (ref 12–28)
BUN: 11 mg/dL (ref 8–27)
Bilirubin Total: 0.3 mg/dL (ref 0.0–1.2)
CO2: 25 mmol/L (ref 20–29)
Calcium: 9.5 mg/dL (ref 8.7–10.3)
Chloride: 102 mmol/L (ref 96–106)
Creatinine, Ser: 0.79 mg/dL (ref 0.57–1.00)
Globulin, Total: 2.1 g/dL (ref 1.5–4.5)
Glucose: 101 mg/dL — ABNORMAL HIGH (ref 70–99)
Potassium: 4 mmol/L (ref 3.5–5.2)
Sodium: 142 mmol/L (ref 134–144)
Total Protein: 6.2 g/dL (ref 6.0–8.5)
eGFR: 82 mL/min/{1.73_m2} (ref >59)

## 2022-04-23 LAB — LIPID PANEL
Chol/HDL Ratio: 3.9 ratio (ref 0.0–4.4)
Cholesterol, Total: 193 mg/dL (ref 100–199)
HDL: 49 mg/dL (ref >39)
LDL Chol Calc (NIH): 125 mg/dL — ABNORMAL HIGH (ref 0–99)
Triglycerides: 104 mg/dL (ref 0–149)
VLDL Cholesterol Cal: 19 mg/dL (ref 5–40)

## 2022-04-24 NOTE — Progress Notes (Signed)
Improved labs. Discuss at follow up visit 05/04/2022.

## 2022-05-04 ENCOUNTER — Ambulatory Visit: Payer: Medicare Other | Admitting: Family Medicine

## 2022-05-04 NOTE — Progress Notes (Deleted)
   Established Patient Office Visit  Subjective   Patient ID: Terri Ayala, female    DOB: 1955/07/09  Age: 67 y.o. MRN: [id number]  No chief complaint on file.   HPI Terri Ayala is a 67 y.o. female presenting today for follow up of hypertension, mood, migraine. Hypertension: Patient here for follow-up of elevated blood pressure. She {is/is not:9024} exercising and {is/is not:9024} adherent to low salt diet.   Pt denies chest pain, SOB, dizziness, edema, syncope, fatigue or heart palpitations. Taking ***, reports {excellent/good/fair/poor:19665} compliance with treatment. Denies side effects. Mood: Patient is here to follow up for ***. she is currently managing with ***. Taking medication without side effects, reports {excellent/good/fair/poor:19665} compliance with treatment. Denies mood changes or SI/HI. she feels mood is {improved/worse/stable:29130} since last visit. Denies chest pain, difficulty concentrating, dizziness, fatigue, insomnia, irritability, palpitations, panic attacks, racing thoughts, SOB, sweating. Denies anhedonia, depressed mood, difficulty concentrating, fatigue, feelings of worthlessness/guilt, hopelessness, hypersomnia, impaired memory, insomnia, psychomotor agitation, psychomotor retardation, recurrent thoughts of death, weight changes.     12/18/21    1:29 PM 09/03/2021   12:42 PM 07/30/2021   10:48 AM  Depression screen PHQ 2/9  Decreased Interest 0  0  Down, Depressed, Hopeless 0  0  PHQ - 2 Score 0  0  Altered sleeping 0  1  Tired, decreased energy 1  1  Change in appetite 0  0  Feeling bad or failure about yourself  0  0  Trouble concentrating 0  1  Moving slowly or fidgety/restless 0  0  Suicidal thoughts 0  0  PHQ-9 Score 1  3  Difficult doing work/chores Not difficult at all       Information is confidential and restricted. Go to Review Flowsheets to unlock data.       18-Dec-2021    1:29 PM 09/03/2021   12:42 PM 07/30/2021   10:49 AM 05/05/2021     2:31 PM  GAD 7 : Generalized Anxiety Score  Nervous, Anxious, on Edge 0  1 1  Control/stop worrying 0  1 1  Worry too much - different things 0  1 1  Trouble relaxing 0  0 1  Restless 0  0 1  Easily annoyed or irritable 0  0 1  Afraid - awful might happen 0  0 0  Total GAD 7 Score 0  3 6  Anxiety Difficulty Not difficult at all        Information is confidential and restricted. Go to Review Flowsheets to unlock data.   Migraine: Currently taking sumatriptan 100 mg as needed.  ROS Negative unless otherwise noted in HPI   Objective:     There were no vitals taken for this visit.  Physical Exam   No results found for any visits on 05/04/22.  {Labs (Optional):23779}  The 10-year ASCVD risk score (Arnett DK, et al., 2019) is: 17.4%    Assessment & Plan:  Migraine without aura and without status migrainosus, not intractable  Major depressive disorder, recurrent episode, moderate (HCC)  GAD (generalized anxiety disorder)  Essential hypertension    No follow-ups on file.    Velva Harman, PA

## 2022-05-23 ENCOUNTER — Ambulatory Visit (INDEPENDENT_AMBULATORY_CARE_PROVIDER_SITE_OTHER): Payer: Medicare Other | Admitting: Family Medicine

## 2022-05-23 ENCOUNTER — Encounter: Payer: Self-pay | Admitting: Family Medicine

## 2022-05-23 VITALS — BP 120/80 | HR 80 | Resp 18 | Ht 62.0 in | Wt 116.0 lb

## 2022-05-23 DIAGNOSIS — F331 Major depressive disorder, recurrent, moderate: Secondary | ICD-10-CM

## 2022-05-23 DIAGNOSIS — R2689 Other abnormalities of gait and mobility: Secondary | ICD-10-CM | POA: Insufficient documentation

## 2022-05-23 DIAGNOSIS — F411 Generalized anxiety disorder: Secondary | ICD-10-CM | POA: Diagnosis not present

## 2022-05-23 DIAGNOSIS — I10 Essential (primary) hypertension: Secondary | ICD-10-CM

## 2022-05-23 DIAGNOSIS — G43009 Migraine without aura, not intractable, without status migrainosus: Secondary | ICD-10-CM

## 2022-05-23 DIAGNOSIS — R259 Unspecified abnormal involuntary movements: Secondary | ICD-10-CM | POA: Insufficient documentation

## 2022-05-23 DIAGNOSIS — R29898 Other symptoms and signs involving the musculoskeletal system: Secondary | ICD-10-CM | POA: Insufficient documentation

## 2022-05-23 NOTE — Assessment & Plan Note (Addendum)
Followed by Beverly Sessions. Stable.  Continue Wellbutrin 150 mg daily, BuSpar 15 mg 3 times daily, trazodone 50 mg nightly.  Will continue to monitor.

## 2022-05-23 NOTE — Assessment & Plan Note (Signed)
Followed by cardiology.  Stable, continue carvedilol 6.25 mg twice daily.  Last CMP 04/22/2022 within normal limits.  Recommend continuing DASH diet and regular activity.  Will continue to monitor.

## 2022-05-23 NOTE — Assessment & Plan Note (Addendum)
Family history positive for ALS.  Current symptoms include issues with balance, abnormal movements/gait, bilateral hand weakness, changes in memory, changes in vision.  CT head negative 4 months ago.  Starting with lab work for blood or electrolyte abnormalities, possible autoimmune disorder, B12 deficiency, ALS.  Given recent negative head CT, ordering MRI to evaluate for any structural abnormalities of the brain.  In the meantime, referring to neurology for further workup given normal visit with cardiology in February.  Ddx: Movement disorder such as Huntington's, Parkinson's, ALS; normal pressure hydrocephalus secondary to CNS injury that resulted in hospitalization; MS; myasthenia gravis; TIA or carotid artery disease

## 2022-05-23 NOTE — Progress Notes (Signed)
Established Patient Office Visit  Subjective   Patient ID: Terri Ayala, female    DOB: Aug 23, 1955  Age: 67 y.o. MRN: [id number]  Chief Complaint  Patient presents with   Medical Management of Chronic Issues   Hypertension   Extremity Weakness   Memory Loss    HPI Terri FERNS is a 67 y.o. female presenting today for follow up of hypertension, migraine, mood.  She has more pressing concerns regarding changes in her balance and motor skills.  She has been experiencing some declines in her memory for a while, but since her hospitalization in December after taking a THC gummy and having a poor reaction, she has experienced worsening balance and weakness in her arms and hands.  CT of her head while in the hospital was negative.  Since discharge, she has been experiencing an increase in falls, typically when her left ankle gives out though it has happened with her right ankle as well.  She denies any dizziness or vertigo, though she has had 2 episodes of "blacking out" while driving where she could not remember how she got to her destination.  Additionally, she has felt that her hands are weak and she has been dropping things occasionally.  She has hearing loss already and denies any changes in her hearing.  Denies any new or worsening headache, though she does have chronic migraines.  She endorses 1 episode of urinary incontinence since her hospitalization, denies fecal incontinence.  She has noticed that her right eye has had worsening vision.  Denies family history of Huntington's disease, Parkinson's disease, multiple sclerosis, myasthenia gravis.  She does have an uncle who was diagnosed with ALS and passed away recently. Hypertension: Patient here for follow-up of elevated blood pressure. Pt denies chest pain, SOB, dizziness, edema, fatigue or heart palpitations. Taking carvedilol, reports excellent compliance with treatment. Denies side effects.  Saw cardiology about 1 month ago. Migraine:  Controlled, takes Imitrex as rescue medication. Mood: Patient is here to follow up for anxiety and depression, currently managing with Wellbutrin, BuSpar, trazodone. Taking medication without side effects, reports excellent compliance with treatment.  Medication management by [facility] behavioral health.     12/07/2021    1:29 PM 09/03/2021   12:42 PM 07/30/2021   10:48 AM  Depression screen PHQ 2/9  Decreased Interest 0  0  Down, Depressed, Hopeless 0  0  PHQ - 2 Score 0  0  Altered sleeping 0  1  Tired, decreased energy 1  1  Change in appetite 0  0  Feeling bad or failure about yourself  0  0  Trouble concentrating 0  1  Moving slowly or fidgety/restless 0  0  Suicidal thoughts 0  0  PHQ-9 Score 1  3  Difficult doing work/chores Not difficult at all       Information is confidential and restricted. Go to Review Flowsheets to unlock data.       12/07/2021    1:29 PM 09/03/2021   12:42 PM 07/30/2021   10:49 AM 05/05/2021    2:31 PM  GAD 7 : Generalized Anxiety Score  Nervous, Anxious, on Edge 0  1 1  Control/stop worrying 0  1 1  Worry too much - different things 0  1 1  Trouble relaxing 0  0 1  Restless 0  0 1  Easily annoyed or irritable 0  0 1  Afraid - awful might happen 0  0 0  Total GAD 7 Score 0  3 6  Anxiety Difficulty Not difficult at all        Information is confidential and restricted. Go to Review Flowsheets to unlock data.   ROS Negative unless otherwise noted in HPI   Objective:     BP 120/80 (BP Location: Left Arm, Patient Position: Sitting, Cuff Size: Normal)   Pulse 80   Resp 18   Ht 5\' 2"  (1.575 m)   Wt 116 lb (52.6 kg)   SpO2 100%   BMI 21.22 kg/m   Physical Exam Constitutional:      General: She is not in acute distress.    Appearance: Normal appearance. She is not ill-appearing.  HENT:     Head: Normocephalic and atraumatic.     Right Ear: Tympanic membrane, ear canal and external ear normal.     Left Ear: Tympanic membrane, ear canal and  external ear normal.     Nose: Nose normal. No congestion or rhinorrhea.     Mouth/Throat:     Mouth: Mucous membranes are moist.     Pharynx: Oropharynx is clear. No oropharyngeal exudate or posterior oropharyngeal erythema.  Eyes:     General:        Right eye: No discharge.        Left eye: No discharge.     Conjunctiva/sclera: Conjunctivae normal.     Pupils: Pupils are equal, round, and reactive to light.  Cardiovascular:     Rate and Rhythm: Normal rate and regular rhythm.     Pulses: Normal pulses.     Heart sounds: No murmur heard.    No friction rub. No gallop.  Pulmonary:     Effort: Pulmonary effort is normal. No respiratory distress.     Breath sounds: Normal breath sounds. No wheezing, rhonchi or rales.  Skin:    General: Skin is warm and dry.  Neurological:     Mental Status: She is alert and oriented to person, place, and time.     Cranial Nerves: Cranial nerves 2-12 are intact.     Motor: No weakness, tremor or pronator drift.     Coordination: Romberg sign negative. Heel to Shin Test normal.     Gait: Gait abnormal.     Assessment & Plan:  GAD (generalized anxiety disorder) Assessment & Plan: Followed by Beverly Sessions. Stable.  Continue Wellbutrin 150 mg daily, BuSpar 15 mg 3 times daily, trazodone 50 mg nightly.  Will continue to monitor.   Essential hypertension Assessment & Plan: Followed by cardiology.  Stable, continue carvedilol 6.25 mg twice daily.  Last CMP 04/22/2022 within normal limits.  Recommend continuing DASH diet and regular activity.  Will continue to monitor.   Migraine without aura and without status migrainosus, not intractable Assessment & Plan: Stable.  Continue Imitrex 100 mg tablet as needed for acute rescue.   Major depressive disorder, recurrent episode, moderate (HCC) Assessment & Plan: Followed by Beverly Sessions. Continue Wellbutrin 150 mg daily, BuSpar 15 mg 3 times daily, trazodone 50 mg nightly.  Will continue to monitor.   Balance  problem Assessment & Plan: Family history positive for ALS.  Current symptoms include issues with balance, abnormal movements/gait, bilateral hand weakness, changes in memory, changes in vision.  CT head negative 4 months ago.  Starting with lab work for blood or electrolyte abnormalities, possible autoimmune disorder, B12 deficiency, ALS.  Given recent negative head CT, ordering MRI to evaluate for any structural abnormalities of the brain.  In the meantime, referring to neurology for further workup given  normal visit with cardiology in February.  Ddx: Movement disorder such as Huntington's, Parkinson's, ALS; normal pressure hydrocephalus secondary to CNS injury that resulted in hospitalization; MS; myasthenia gravis; TIA or carotid artery disease  Orders: -     CBC with Differential/Platelet; Future -     Comprehensive metabolic panel; Future -     CK; Future -     Sedimentation rate -     ANA w/Reflex if Positive -     Vitamin B12; Future -     MR BRAIN W WO CONTRAST; Future -     Ambulatory referral to Neurology  Abnormal movement -     CBC with Differential/Platelet; Future -     Comprehensive metabolic panel; Future -     CK; Future -     Sedimentation rate -     ANA w/Reflex if Positive -     Vitamin B12; Future -     MR BRAIN W WO CONTRAST; Future -     Ambulatory referral to Neurology  Weakness of both hands -     CBC with Differential/Platelet; Future -     Comprehensive metabolic panel; Future -     CK; Future -     Sedimentation rate -     ANA w/Reflex if Positive -     Vitamin B12; Future -     MR BRAIN W WO CONTRAST; Future -     Ambulatory referral to Neurology  Return in about 3 months (around 08/23/2022) for follow-up.   I spent 50 minutes on the day of the encounter to include pre-visit record review, face-to-face time with the patient and post visit ordering of test.  Velva Harman, PA

## 2022-05-23 NOTE — Assessment & Plan Note (Addendum)
Followed by Beverly Sessions. Continue Wellbutrin 150 mg daily, BuSpar 15 mg 3 times daily, trazodone 50 mg nightly.  Will continue to monitor.

## 2022-05-23 NOTE — Assessment & Plan Note (Signed)
Stable.  Continue Imitrex 100 mg tablet as needed for acute rescue.

## 2022-05-24 LAB — COMPREHENSIVE METABOLIC PANEL
ALT: 8 IU/L (ref 0–32)
AST: 15 IU/L (ref 0–40)
Albumin/Globulin Ratio: 2.3 — ABNORMAL HIGH (ref 1.2–2.2)
Albumin: 4.6 g/dL (ref 3.9–4.9)
Alkaline Phosphatase: 54 IU/L (ref 44–121)
BUN/Creatinine Ratio: 13 (ref 12–28)
BUN: 12 mg/dL (ref 8–27)
Bilirubin Total: 0.3 mg/dL (ref 0.0–1.2)
CO2: 23 mmol/L (ref 20–29)
Calcium: 9.5 mg/dL (ref 8.7–10.3)
Chloride: 102 mmol/L (ref 96–106)
Creatinine, Ser: 0.95 mg/dL (ref 0.57–1.00)
Globulin, Total: 2 g/dL (ref 1.5–4.5)
Glucose: 99 mg/dL (ref 70–99)
Potassium: 4.8 mmol/L (ref 3.5–5.2)
Sodium: 142 mmol/L (ref 134–144)
Total Protein: 6.6 g/dL (ref 6.0–8.5)
eGFR: 66 mL/min/{1.73_m2} (ref >59)

## 2022-05-24 LAB — CBC WITH DIFFERENTIAL/PLATELET
Basophils Absolute: 0.1 10*3/uL (ref 0.0–0.2)
Basos: 1 %
EOS (ABSOLUTE): 0.2 10*3/uL (ref 0.0–0.4)
Eos: 2 %
Hematocrit: 38.9 % (ref 34.0–46.6)
Hemoglobin: 13 g/dL (ref 11.1–15.9)
Immature Grans (Abs): 0 10*3/uL (ref 0.0–0.1)
Immature Granulocytes: 0 %
Lymphocytes Absolute: 1.7 10*3/uL (ref 0.7–3.1)
Lymphs: 22 %
MCH: 31 pg (ref 26.6–33.0)
MCHC: 33.4 g/dL (ref 31.5–35.7)
MCV: 93 fL (ref 79–97)
Monocytes Absolute: 0.6 10*3/uL (ref 0.1–0.9)
Monocytes: 8 %
Neutrophils Absolute: 5.1 10*3/uL (ref 1.4–7.0)
Neutrophils: 67 %
Platelets: 234 10*3/uL (ref 150–450)
RBC: 4.19 x10E6/uL (ref 3.77–5.28)
RDW: 12.4 % (ref 11.7–15.4)
WBC: 7.7 10*3/uL (ref 3.4–10.8)

## 2022-05-24 LAB — CK: Total CK: 54 U/L (ref 32–182)

## 2022-05-24 LAB — SEDIMENTATION RATE: Sed Rate: 2 mm/hr (ref 0–40)

## 2022-05-24 LAB — VITAMIN B12: Vitamin B-12: 1549 pg/mL — ABNORMAL HIGH (ref 232–1245)

## 2022-05-24 LAB — ANA W/REFLEX IF POSITIVE: Anti Nuclear Antibody (ANA): NEGATIVE

## 2022-05-26 ENCOUNTER — Other Ambulatory Visit: Payer: Self-pay | Admitting: Nurse Practitioner

## 2022-05-28 ENCOUNTER — Other Ambulatory Visit: Payer: Self-pay | Admitting: Nurse Practitioner

## 2022-05-28 DIAGNOSIS — F1020 Alcohol dependence, uncomplicated: Secondary | ICD-10-CM

## 2022-06-02 ENCOUNTER — Encounter: Payer: Self-pay | Admitting: Neurology

## 2022-06-03 ENCOUNTER — Ambulatory Visit
Admission: RE | Admit: 2022-06-03 | Discharge: 2022-06-03 | Disposition: A | Discharge status: Home / Self Care | Payer: Medicare Other | Source: Ambulatory Visit | Attending: Physician Assistant | Admitting: Physician Assistant

## 2022-06-03 DIAGNOSIS — M8580 Other specified disorders of bone density and structure, unspecified site: Secondary | ICD-10-CM

## 2022-06-03 DIAGNOSIS — Z780 Asymptomatic menopausal state: Secondary | ICD-10-CM

## 2022-06-16 ENCOUNTER — Other Ambulatory Visit: Payer: Self-pay | Admitting: Nurse Practitioner

## 2022-06-16 DIAGNOSIS — I10 Essential (primary) hypertension: Secondary | ICD-10-CM

## 2022-06-23 ENCOUNTER — Ambulatory Visit
Admission: RE | Admit: 2022-06-23 | Discharge: 2022-06-23 | Disposition: A | Discharge status: Home / Self Care | Payer: Medicare Other | Source: Ambulatory Visit | Attending: Family Medicine | Admitting: Family Medicine

## 2022-06-23 DIAGNOSIS — R259 Unspecified abnormal involuntary movements: Secondary | ICD-10-CM

## 2022-06-23 DIAGNOSIS — R29898 Other symptoms and signs involving the musculoskeletal system: Secondary | ICD-10-CM

## 2022-06-23 DIAGNOSIS — R2689 Other abnormalities of gait and mobility: Secondary | ICD-10-CM

## 2022-06-23 MED ORDER — GADOPICLENOL 0.5 MMOL/ML IV SOLN
5.5000 mL | Freq: Once | INTRAVENOUS | Status: AC | PRN
  Administered 2022-06-23: 5.5 mL via INTRAVENOUS

## 2022-06-29 ENCOUNTER — Other Ambulatory Visit: Payer: Self-pay | Admitting: Nurse Practitioner

## 2022-06-29 DIAGNOSIS — F1020 Alcohol dependence, uncomplicated: Secondary | ICD-10-CM

## 2022-07-04 ENCOUNTER — Encounter: Payer: Self-pay | Admitting: Neurology

## 2022-07-04 ENCOUNTER — Ambulatory Visit (INDEPENDENT_AMBULATORY_CARE_PROVIDER_SITE_OTHER): Payer: Medicare Other | Admitting: Neurology

## 2022-07-04 VITALS — BP 142/84 | HR 88 | Ht 62.0 in | Wt 118.0 lb

## 2022-07-04 DIAGNOSIS — R2681 Unsteadiness on feet: Secondary | ICD-10-CM | POA: Diagnosis not present

## 2022-07-04 DIAGNOSIS — F09 Unspecified mental disorder due to known physiological condition: Secondary | ICD-10-CM | POA: Diagnosis not present

## 2022-07-04 NOTE — Patient Instructions (Addendum)
Start physical therapy for balance

## 2022-07-04 NOTE — Progress Notes (Unsigned)
[facility] Clinic Note - Initial Visit   Date: 07/04/2022   Terri Ayala MRN: [id number] DOB: 1956-01-22   Dear Saralyn Pilar, PA:  Thank you for your kind referral of Terri Ayala for consultation of imbalance. Although her history is well known to you, please allow [place] to reiterate it for the purpose of our medical record. The patient was accompanied to the clinic by caregiver who also provides collateral information.     Terri Ayala is a 67 y.o. right-handed female with asthma, hyperlipidemia, OA, anxiety, gastroparesis, history of alcohol abuse migraines presenting for evaluation of imbalance.   IMPRESSION/PLAN: Gait imbalance most likely contributed by alcoholic neuropathy. MRI brain was personally viewed with patient which shows 68-related changes.  She may have a small left cerebellar cavernoma vs microhemorrhage, but this would not cause any of her symptoms.  Patient was reassured.  For her imbalance, I will refer her to home PT.   Mild cognitive impairment is most likely stemming from anxiety/mood disorder.  Patient was urged not to google symptoms online.  Encouraged to engage in cognitive stimulating activities.    ------------------------------------------------------------- History of present illness: She was hospitalized in December 2023 with altered mental status which started acutely after taking THC gummy.  She felt confused, like a zombie, was having diffiulty with speech, word-finding difficulty, looses train of thought.  Symptoms have improved significantly, but she is still not back to her baseline.  She continues to have imbalance and would like to do physical therapy.  PCP ordered MRI brain which showed nonspecific white matter changes, left cerebellar cavernoma vs microhemorrhage, and mild generalized atrophy. She is has been googling her symptoms and these findings online and is very concerned that she has a neurodegenerative  condition.   She lives at home alone and is able to do IADLs, except driving is limited.  She can do ADLs.  She has a caregiver to assist with household chores and transportation.  Out-side paper records, electronic medical record, and images have been reviewed where available and summarized as:  MRI brain wwo contrast 06/23/2022: IMPRESSION: 1.  No evidence of an acute intracranial abnormality. 2. Mild-to-moderate for age multifocal T2 FLAIR hyperintense signal abnormality within the cerebral white matter, nonspecific but most often secondary to chronic small vessel ischemia. 3. 5 mm focus of signal abnormality within the inferomedial left cerebellar hemisphere, which may reflect a chronic microhemorrhage or cavernoma. Additionally, there are a few surrounding punctate chronic microhemorrhages at this site. 4. Mild generalized cerebral atrophy. 5. Small mucous retention cysts within the left maxillary sinus. 6. Trace fluid within the bilateral mastoid air cells.   Lab Results  Component Value Date   HGBA1C 4.7 (L) 11/21/2020   Lab Results  Component Value Date   VITAMINB12 1,549 (H) 05/23/2022   Lab Results  Component Value Date   TSH 3.018 02/16/2022   Lab Results  Component Value Date   ESRSEDRATE 2 05/23/2022    Past Medical History:  Diagnosis Date   Alcohol withdrawal (HCC) 11/2017; 01/04/2018   Alcoholism (HCC)    Anxiety    Arthritis    "hands" (01/04/2018)   ASYMPTOMATIC POSTMENOPAUSAL STATUS 08/17/2009   Chronic lower back pain    Depression    DYSPHAGIA 06/19/2007   Fatty liver, alcoholic    GERD (gastroesophageal reflux disease)    GOITER, MULTINODULAR 08/17/2009   History of colonic polyps 03/25/2020   Hyperlipidemia    Hypertension    Migraines    "  not sure what triggers them; I'll have 1 q couple weeks or more; come 2 days in a row when they come" (01/04/2018)   Mitral valve prolapse    OSTEOPOROSIS 08/17/2009   PONV (postoperative nausea and vomiting)     Supraventricular tachycardia     Past Surgical History:  Procedure Laterality Date   BREAST BIOPSY Right    "benign"   CATARACT EXTRACTION W/ INTRAOCULAR LENS  IMPLANT, BILATERAL     CERVICAL CONE BIOPSY  2000s   CERVIX LESION DESTRUCTION  1991   "dysplasia; lesions"   CHOLECYSTECTOMY N/A 03/14/2013   Procedure: LAPAROSCOPIC CHOLECYSTECTOMY WITH ATTEMPTED INTRAOPERATIVE CHOLANGIOGRAM;  Surgeon: Shelly Rubenstein, MD;  Location: [facility] OR;  Service: General;  Laterality: N/A;   HARDWARE REMOVAL Left 07/22/2021   Procedure: HARDWARE REMOVAL;  Surgeon: Myrene Galas, MD;  Location: [facility] OR;  Service: Orthopedics;  Laterality: Left;   laporoscopic abdominal surgery     "for endometriosis"   LEFT HEART CATH AND CORONARY ANGIOGRAPHY N/A 05/07/2018   Procedure: LEFT HEART CATH AND CORONARY ANGIOGRAPHY;  Surgeon: Lyn Records, MD;  Location: [facility] INVASIVE CV LAB;  Service: Cardiovascular;  Laterality: N/A;   ORIF ANKLE FRACTURE Left 02/11/2021   Procedure: OPEN REDUCTION INTERNAL FIXATION (ORIF) ANKLE FRACTURE;  Surgeon: Myrene Galas, MD;  Location: [facility] OR;  Service: Orthopedics;  Laterality: Left;   TONSILLECTOMY       Medications:  Outpatient Encounter Medications as of 07/04/2022  Medication Sig   albuterol (VENTOLIN HFA) 108 (90 Base) MCG/ACT inhaler INHALE 2 PUFFS INTO THE LUNGS EVERY 6 HOURS AS NEEDED FOR WHEEZING OR SHORTNESS OF BREATH. (Patient taking differently: Inhale 2 puffs into the lungs every 6 (six) hours as needed for wheezing or shortness of breath.)   alendronate (FOSAMAX) 70 MG tablet Take 70 mg by mouth every Monday.   baclofen (LIORESAL) 10 MG tablet TAKE 1 TABLET BY MOUTH 3 TIMES DAILY.   buPROPion (WELLBUTRIN XL) 150 MG 24 hr tablet Take 1 tablet (150 mg total) by mouth every morning.   busPIRone (BUSPAR) 15 MG tablet Take 1 tablet (15 mg total) by mouth 3 (three) times daily.   Calcium Carbonate-Vitamin D (CALCIUM 600+D PO) Take 1 tablet by mouth daily.   carvedilol  (COREG) 6.25 MG tablet TAKE 1 TABLET (6.25 MG TOTAL) BY MOUTH 2 TIMES DAILY WITH A MEAL.   fluticasone (FLONASE) 50 MCG/ACT nasal spray Place 2 sprays into both nostrils daily as needed for allergies.   fluticasone (FLOVENT HFA) 110 MCG/ACT inhaler 2 puff(s) Puff and swallow/ not for asthma, for EOE 2 times a day for 30 day(s)   folic acid (FOLVITE) 1 MG tablet TAKE 1 TABLET BY MOUTH DAILY.   gabapentin (NEURONTIN) 300 MG capsule TAKE 1 CAPSULE BY MOUTH 2 TIMES DAILY.   meloxicam (MOBIC) 15 MG tablet Take 1 tablet (15 mg total) by mouth daily as needed for pain.   Multiple Vitamins-Minerals (ADULT GUMMY PO) Take 2 capsules by mouth daily.   norethindrone-ethinyl estradiol (FEMHRT 1/5) 1-5 MG-MCG TABS tablet Take 1 tablet by mouth daily.   pantoprazole (PROTONIX) 40 MG tablet TAKE 1 TABLET BY MOUTH 2 TIMES DAILY.   SUMAtriptan (IMITREX) 100 MG tablet Take 1 tablet (100 mg total) by mouth every 2 (two) hours as needed for migraine. May repeat in 2 hours if headache persists or recurs.   traZODone (DESYREL) 50 MG tablet Take 1 tablet (50 mg total) by mouth at bedtime.   tretinoin (RETIN-A) 0.025 % cream 1 application  in the evening to face Externally Once a day, PM pea size amount for 30 days   ampicillin (PRINCIPEN) 500 MG capsule Take 500 mg by mouth daily.   Azelaic Acid 15 % gel  (Patient not taking: Reported on 07/04/2022)   clindamycin (CLINDAGEL) 1 % gel    diclofenac Sodium (VOLTAREN) 1 % GEL Apply 2 g 4 times a day by topical route.   fluocinonide (LIDEX) 0.05 % external solution    ketoconazole (NIZORAL) 2 % shampoo APPLY 5 MLS TO AFFECTED AREA 2 TO 3 TIMES A WEEK for 30   No facility-administered encounter medications on file as of 07/04/2022.    Allergies:  Allergies  Allergen Reactions   Doxycycline Swelling    Mouth swelling and sores  Other Reaction(s): Not available   Codeine Itching    Other Reaction(s): Not available   Sulfa Antibiotics     Possibly??   Tetracycline Hcl  Swelling    Mouth swelling and sores    Family History: Family History  Problem Relation Age of Onset   Colon cancer Father        Deceased, 86   Osteoporosis Mother        Living, 62   Healthy Brother    Healthy Brother    Healthy Son    Healthy Son    Thyroid disease Neg Hx    Goiter Neg Hx     Social History: Social History   Tobacco Use   Smoking status: Former    Packs/day: 1.00    Years: 7.00    Additional pack years: 0.00    Total pack years: 7.00    Types: Cigarettes    Quit date: 02/28/1974    Years since quitting: 48.3   Smokeless tobacco: Never   Tobacco comments:    01/04/2018 "smoked when I was a teenager"  Vaping Use   Vaping Use: Never used  Substance Use Topics   Alcohol use: Not Currently    Alcohol/week: 28.0 standard drinks of alcohol    Types: 28 Glasses of wine per week    Comment: I had Detox on 07/13/21 and no alcohol since then   Drug use: Never   Social History   Social History Narrative   Lives alone.  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      Right Handed    Lives in a one story home    Vital Signs:  BP (!) 174/93   Pulse 88   Ht 5\' 2"  (1.575 m)   Wt 118 lb (53.5 kg)   SpO2 94%   BMI 21.58 kg/m   Neurological Exam: MENTAL STATUS including orientation to time, place, person, recent and remote memory, attention span and concentration, language, and fund of knowledge is normal.  Speech is not dysarthric.  CRANIAL NERVES: II:  No visual field defects.     III-IV-VI: Pupils equal round and reactive to light.  Normal conjugate, extra-ocular eye movements in all directions of gaze.  No nystagmus.  No ptosis.   V:  Normal facial sensation.    VII:  Normal facial symmetry and movements.   VIII:  Normal hearing and vestibular function.   IX-X:  Normal palatal movement.   XI:  Normal shoulder shrug and head rotation.   XII:  Normal tongue strength and range of motion, no deviation  or fasciculation.  MOTOR: Motor strength is 5/5 throughout. No atrophy, fasciculations or abnormal movements.  No  pronator drift.   MSRs:                                           Right        Left brachioradialis 2+  2+  biceps 2+  2+  triceps 2+  2+  patellar 2+  2+  ankle jerk 0  0  Hoffman no  no  plantar response down  down   SENSORY:  Vibration is diminished at the ankles bilaterally.   Romberg's sign positive.   COORDINATION/GAIT: Normal finger-to- nose-finger.  Intact rapid alternating movements bilaterally.   Gait narrow based and stable.  She is unable to perform tandem gait.  Stressed gait intact.   Thank you for allowing me to participate in patient's care.  If I can answer any additional questions, I would be pleased to do so.    Sincerely,    Trai Ells K. Allena Katz, DO

## 2022-07-14 ENCOUNTER — Telehealth (HOSPITAL_BASED_OUTPATIENT_CLINIC_OR_DEPARTMENT_OTHER): Payer: Medicare Other | Admitting: Medical

## 2022-07-14 ENCOUNTER — Encounter (HOSPITAL_COMMUNITY): Payer: Self-pay | Admitting: Medical

## 2022-07-14 DIAGNOSIS — T1490XA Injury, unspecified, initial encounter: Secondary | ICD-10-CM

## 2022-07-14 DIAGNOSIS — K709 Alcoholic liver disease, unspecified: Secondary | ICD-10-CM

## 2022-07-14 DIAGNOSIS — F1021 Alcohol dependence, in remission: Secondary | ICD-10-CM

## 2022-07-14 DIAGNOSIS — E8889 Other specified metabolic disorders: Secondary | ICD-10-CM

## 2022-07-14 DIAGNOSIS — Z79899 Other long term (current) drug therapy: Secondary | ICD-10-CM

## 2022-07-14 DIAGNOSIS — Z8781 Personal history of (healed) traumatic fracture: Secondary | ICD-10-CM

## 2022-07-14 DIAGNOSIS — F4312 Post-traumatic stress disorder, chronic: Secondary | ICD-10-CM | POA: Diagnosis not present

## 2022-07-14 DIAGNOSIS — F1291 Cannabis use, unspecified, in remission: Secondary | ICD-10-CM | POA: Diagnosis not present

## 2022-07-14 DIAGNOSIS — Z9889 Other specified postprocedural states: Secondary | ICD-10-CM

## 2022-07-14 DIAGNOSIS — L718 Other rosacea: Secondary | ICD-10-CM

## 2022-07-14 DIAGNOSIS — Z87828 Personal history of other (healed) physical injury and trauma: Secondary | ICD-10-CM

## 2022-07-14 DIAGNOSIS — Z8659 Personal history of other mental and behavioral disorders: Secondary | ICD-10-CM

## 2022-07-14 DIAGNOSIS — Z8669 Personal history of other diseases of the nervous system and sense organs: Secondary | ICD-10-CM

## 2022-07-14 MED ORDER — BUSPIRONE HCL 15 MG PO TABS: 15.0000 mg | ORAL_TABLET | Freq: Three times a day (TID) | ORAL | 2 refills | Status: DC

## 2022-07-14 MED ORDER — BUPROPION HCL ER (XL) 150 MG PO TB24: 150.0000 mg | ORAL_TABLET | ORAL | 1 refills | Status: DC

## 2022-07-14 MED ORDER — BACLOFEN 10 MG PO TABS: 10.0000 mg | ORAL_TABLET | Freq: Three times a day (TID) | ORAL | 1 refills | Status: AC

## 2022-07-14 NOTE — Progress Notes (Signed)
Virtual Visit via Video Note  I connected with Terri Ayala on 07/14/22 at  2:00 PM EDT by a video enabled telemedicine application and verified that I am speaking with the correct person using two identifiers.  Location: Patient: At home Provider: [facility] OP [facility]   I discussed the limitations of evaluation and management by telemedicine and the availability of in person appointments. The patient expressed understanding and agreed to proceed.   History of Present Illness:See EPIC note    Observations/Objective:See EPIC note   Assessment and Plan:See EPIC note   Follow Up Instructions:See EPIC note   I discussed the assessment and treatment plan with the patient. The patient was provided an opportunity to ask questions and all were answered. The patient agreed with the plan and demonstrated an understanding of the instructions.   The patient was advised to call back or seek an in-person evaluation if the symptoms worsen or if the condition fails to improve as anticipated.  I provided 20 minutes of non-face-to-face time during this encounter.   Terri Ayala, Terri Ayala  [facility] MD/PA/NP OP Progress Note  07/14/2022 2:09 PM Terri Ayala  MRN:  [id number]  Chief Complaint:  Chief Complaint  Patient presents with   Follow-up   Alcohol Problem   Trauma   Stress   Anxiety   Depression   HPI: Terri Ayala is returning for  3 month FU post treatment for her Alcohol +THC SUDS in [facility]. She was discharged 12/15/2021.At her last visit 04/22/2022 She had missed her previous FU appointments starting with 1/04/ 2024 due to mental and physical complications from use of THC which "I thought I could handle " being off alcohol :She requited hospitalization for stabilization. She admitted trying to replace alcohol and had memory of THC use when younger which she thought would repalce the alcohol without the problems associated with her alcohol use. She did wish to continue her anticraving MAT with Baclofen  and her Wellbutrin for her Dysthymia related to trauma.. She reported being back in control of her senses and off all mood altering addictive medications and street drugs. Today she is happy to report that she has maintained her sobriety and is attending meetings.She continues to want Baclofen and Wellbutrin for their benefits to her. : Visit Diagnosis:    ICD-10-CM   1. Alcohol use disorder, severe, in sustained remission, dependence (HCC)  F10.21     2. Cannabis use disorder in remission  F12.91     3. Trauma in childhood  T14.90XA     4. Chronic post-traumatic stress disorder (PTSD)  F43.12     5. History of major depression  Z86.59     6. Alcoholic liver disorder (HCC)  K70.9    RESOLVED    7. Status post open reduction with internal fixation (ORIF) of fracture of ankle  Z98.890    Z87.81     8. Hx of migraines  Z86.69     9. Rosacea keratitis  L71.8     10. Inherited disorder of folate metabolism (HCC)  E88.89     11. Medication management  Z79.899       Past Psychiatric History:  Drug Induced Encephalopathy  Admit date: 02/15/2022 Discharge date:   02/25/2022  Multiple visit to EDs [facility] 2017  [facility] CDI[facility] Date of Admission: 08/18/2021 Date of Discharge: 10/19-20/2023   Past Medical History:  CARE EVERYWHERE REVIEWED Encounters Encounter Location Date Provider Diagnosis  [facility]    [facility]  Neurology Division               [facility]                            [address] [place], [place] [id number]    Terri Chard, DO  Physician Specialty: Neurology               Terri Hazel, MD Physician Cardiology 05/20/2022    06/15/2022                  04/22/2022  Elpidio Anis    :Terri Ayala,  Melanocytic nevi of trunk D22.5 ; SK (seborrheic keratosis) L82.1 ; History of dysplastic nevus Z86.018 ;  Lentigo L81.4 ; Rosacea L71.9 ; Onychomycosis B35.1 ; Neoplasm of uncertain behavior of skin D48.5 and Grover's disease L11.1 Onychomycosis (ICD-10 - B35.1)   [name] is a 67 y.o. right-handed female with asthma, hyperlipidemia, OA, anxiety, gastroparesis, history of alcohol abuse migraines presenting for evaluation of imbalance.    IMPRESSION/PLAN: Gait imbalance most likely contributed by alcoholic neuropathy. MRI brain was personally viewed with patient which shows age-related changes.  She may have a small left cerebellar cavernoma vs microhemorrhage, but this would not cause any of her symptoms.  Patient was reassured.  For her imbalance, I will refer her to home PT.    Mild cognitive impairment is most likely stemming from anxiety/mood disorder.  Patient was urged not to google symptoms online.  Encouraged to engage in cognitive stimulating activities.    History of Present Illness: 67 yo female white female with history of SVT, anxiety and mitral valve prolapse here today for follow up. She was seen as a new patient in our office in 2012 but has not been seen here since 2013.    Past Medical History:  Diagnosis Date   Alcohol withdrawal (HCC) 11/2017; 01/04/2018   Alcoholism (HCC)    Anxiety    Arthritis    "hands" (01/04/2018)   ASYMPTOMATIC POSTMENOPAUSAL STATUS 08/17/2009   Chronic lower back pain    Depression    DYSPHAGIA 06/19/2007   Fatty liver, alcoholic    GERD (gastroesophageal reflux disease)    GOITER, MULTINODULAR 08/17/2009   History of colonic polyps 03/25/2020   Hyperlipidemia    Hypertension    Migraines    "not sure what triggers them; I'll have 1 q couple weeks or more; come 2 days in a row when they come" (01/04/2018)   Mitral valve prolapse    OSTEOPOROSIS 08/17/2009   PONV (postoperative nausea and vomiting)    Supraventricular tachycardia     Past Surgical History:  Procedure Laterality Date   BREAST BIOPSY Right    "benign"   CATARACT  EXTRACTION W/ INTRAOCULAR LENS  IMPLANT, BILATERAL     CERVICAL CONE BIOPSY  2000s   CERVIX LESION DESTRUCTION  1991   "dysplasia; lesions"   CHOLECYSTECTOMY N/A 03/14/2013   Procedure: LAPAROSCOPIC CHOLECYSTECTOMY WITH ATTEMPTED INTRAOPERATIVE CHOLANGIOGRAM;  Surgeon: Shelly Rubenstein, MD;  Location: [facility] OR;  Service: General;  Laterality: N/A;   HARDWARE REMOVAL Left 07/22/2021   Procedure: HARDWARE REMOVAL;  Surgeon: Myrene Galas, MD;  Location: [facility] OR;  Service: Orthopedics;  Laterality: Left;   laporoscopic abdominal surgery     "for endometriosis"   LEFT HEART CATH AND CORONARY ANGIOGRAPHY N/A 05/07/2018   Procedure: LEFT HEART CATH AND CORONARY ANGIOGRAPHY;  Surgeon: Katrinka Blazing,  Barry Dienes, MD;  Location: [facility] INVASIVE CV LAB;  Service: Cardiovascular;  Laterality: N/A;   ORIF ANKLE FRACTURE Left 02/11/2021   Procedure: OPEN REDUCTION INTERNAL FIXATION (ORIF) ANKLE FRACTURE;  Surgeon: Myrene Galas, MD;  Location: [facility] OR;  Service: Orthopedics;  Laterality: Left;   TONSILLECTOMY      Family Psychiatric History:  Maternal uncle alcoholic    Family History:  Family History  Problem Relation Age of Onset   Colon cancer Father        Deceased, 37   Osteoporosis Mother        Living, 57   Healthy Brother    Healthy Brother    Healthy Son    Healthy Son    Thyroid disease Neg Hx    Goiter Neg Hx     Social History:  Social History   Socioeconomic History    Marital status: Single      Spouse name: NA   Number of children: None   Years of education: 16   Highest education level: BS Biology degree  Occupational History   Occupation: Hydrographic surveyor - retired      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[employer]  Tobacco Use   Smoking status: Former      Packs/day: 1.00      Years: 7.00      Pack years: 7.00      Types: Cigarettes      Quit date: 02/28/1974      Years since quitting: 47.3   Smokeless tobacco: Never   Tobacco comments:      01/04/2018 "smoked when I was a teenager"  Vaping Use    Vaping Use: Never used  Substance and Sexual Activity   Alcohol use: Yes      Alcohol/week: 1-3 bottles of wine daily      Types: Wine      Comment: Detox 06/2021 CD IOP 6/23-8/25/2023   Drug use: 2023/2024 CBD Gummies resulting in Metabolic encephalitis   Sexual activity: Not Currently  Other Topics Concern   Mr. Summerlynn Morehart who has POA. (Brother)  Social History Narrative    Lives alone.  She has a BS biology.    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     Financial Resource Strain: No  Food Insecurity: No  Transportation Needs: Insurance provides for Medical care  Physical Activity: Ankle fx limits  Stress: Cravings/Intoxication with falls  Social Connections: Home Health/ Brother     Allergies:  Allergies  Allergen Reactions   Doxycycline Swelling    Mouth swelling and sores  Other Reaction(s): Not available   Codeine Itching    Other Reaction(s): Not available   Tetracycline Hcl Swelling    Mouth swelling and sores    Metabolic Disorder Labs: Lab Results  Component Value Date   HGBA1C 4.7 (L) 11/21/2020   MPG 88.19 11/21/2020   MPG 97 03/19/2013   No results found for: "PROLACTIN" Lab Results  Component Value Date   CHOL 193 04/22/2022   TRIG 104 04/22/2022   HDL 49 04/22/2022   CHOLHDL 3.9 04/22/2022   VLDL 10 10/04/2012   LDLCALC 125 (H) 04/22/2022   LDLCALC 168 (H) 12/15/2021   Lab Results  Component Value Date   TSH 3.018 02/16/2022   TSH 1.753 01/26/2022    Therapeutic Level Labs:NA   Current Medications: Current Outpatient Medications  Medication Sig Dispense Refill   albuterol (VENTOLIN HFA) 108 (90 Base) MCG/ACT inhaler INHALE 2 PUFFS INTO THE LUNGS  EVERY 6 HOURS AS NEEDED FOR WHEEZING OR SHORTNESS OF BREATH. (Patient taking differently: Inhale 2 puffs into the lungs every 6 (six) hours as needed for wheezing or shortness of breath.) 8.5 g 2   alendronate (FOSAMAX) 70 MG tablet Take 70 mg by mouth every  Monday.     ampicillin (PRINCIPEN) 500 MG capsule Take 500 mg by mouth daily.     Azelaic Acid 15 % gel  (Patient not taking: Reported on 07/04/2022)     baclofen (LIORESAL) 10 MG tablet TAKE 1 TABLET BY MOUTH 3 TIMES DAILY. 270 tablet 0   buPROPion (WELLBUTRIN XL) 150 MG 24 hr tablet Take 1 tablet (150 mg total) by mouth every morning. 90 tablet 0   busPIRone (BUSPAR) 15 MG tablet Take 1 tablet (15 mg total) by mouth 3 (three) times daily. 90 tablet 2   Calcium Carbonate-Vitamin D (CALCIUM 600+D PO) Take 1 tablet by mouth daily.     carvedilol (COREG) 6.25 MG tablet TAKE 1 TABLET (6.25 MG TOTAL) BY MOUTH 2 TIMES DAILY WITH A MEAL. 180 tablet 1   clindamycin (CLINDAGEL) 1 % gel      diclofenac Sodium (VOLTAREN) 1 % GEL Apply 2 g 4 times a day by topical route.     fluocinonide (LIDEX) 0.05 % external solution      fluticasone (FLONASE) 50 MCG/ACT nasal spray Place 2 sprays into both nostrils daily as needed for allergies.     fluticasone (FLOVENT HFA) 110 MCG/ACT inhaler 2 puff(s) Puff and swallow/ not for asthma, for EOE 2 times a day for 30 day(s)     folic acid (FOLVITE) 1 MG tablet TAKE 1 TABLET BY MOUTH DAILY. 30 tablet 1   gabapentin (NEURONTIN) 300 MG capsule TAKE 1 CAPSULE BY MOUTH 2 TIMES DAILY. 60 capsule 2   ketoconazole (NIZORAL) 2 % shampoo APPLY 5 MLS TO AFFECTED AREA 2 TO 3 TIMES A WEEK for 30     meloxicam (MOBIC) 15 MG tablet Take 1 tablet (15 mg total) by mouth daily as needed for pain. 30 tablet 1   Multiple Vitamins-Minerals (ADULT GUMMY PO) Take 2 capsules by mouth daily.     norethindrone-ethinyl estradiol (FEMHRT 1/5) 1-5 MG-MCG TABS tablet Take 1 tablet by mouth daily.     pantoprazole (PROTONIX) 40 MG tablet TAKE 1 TABLET BY MOUTH 2 TIMES DAILY. 180 tablet 0   SUMAtriptan (IMITREX) 100 MG tablet Take 1 tablet (100 mg total) by mouth every 2 (two) hours as needed for migraine. May repeat in 2 hours if headache persists or recurs. 15 tablet 1   traZODone (DESYREL) 50 MG  tablet Take 1 tablet (50 mg total) by mouth at bedtime. 90 tablet 0   tretinoin (RETIN-A) 0.025 % cream 1 application in the evening to face Externally Once a day, PM pea size amount for 30 days     No current facility-administered medications for this visit.     Musculoskeletal: Strength & Muscle Tone: Telepsych visit-Grossly normal Musculoskeletal and cranial nerve inspections Gait & Station: NA Patient leans: N/A  Psychiatric Specialty Exam: Review of Systems  Constitutional:  Negative for activity change, appetite change, chills, diaphoresis, fatigue, fever and unexpected weight change.  HENT: Negative.    Respiratory:  Positive for wheezing. Negative for apnea, cough, choking, chest tightness, shortness of breath and stridor.   Cardiovascular:  Negative for chest pain, palpitations and leg swelling.       HBP  Gastrointestinal:  Positive for abdominal pain (rx Prilosec).  Endocrine: Negative.   Genitourinary: Negative.   Musculoskeletal:  Positive for arthralgias, back pain and gait problem.  Skin:  Positive for color change and rash. Negative for pallor and wound.       Melanocytic nevi of trunk D22.5 ; SK (seborrheic keratosis) L82.1 ; History of dysplastic nevus Z86.018 ; Lentigo L81.4 ; Rosacea L71.9 ; Onychomycosis B35.1 ; Neoplasm of uncertain behavior of skin D48.5 and Grover's disease L11.1  Allergic/Immunologic: Positive for environmental allergies.  Neurological:  Positive for headaches (Migraine). Negative for dizziness, tremors, seizures, syncope, facial asymmetry, speech difficulty, weakness, light-headedness and numbness.  Hematological: Negative.        FOLIC ACID DEFICIENCY (GENETIC)  Psychiatric/Behavioral:  Negative for agitation (Baclofen for ETOH cravings), behavioral problems, confusion, decreased concentration, dysphoric mood (Wellbutrin controls), hallucinations, self-injury, sleep disturbance and suicidal ideas. The patient is not nervous/anxious (Gabapentin  controls) and is not hyperactive.     There were no vitals taken for this visit.There is no height or weight on file to calculate BMI. mY cHART VISIT  General Appearance: Casual and Well Groomed  Eye Contact:  Good  Speech:  Clear and Coherent and Normal Rate  Volume:  Normal  Mood:  Euthymic  Affect:  Appropriate and Congruent  Thought Process:  Coherent, Goal Directed, and Descriptions of Associations: Intact  Orientation:  Full (Time, Place, and Person)  Thought Content: WDL and Logical   Suicidal Thoughts:  No  Homicidal Thoughts:  No  Memory:  Affected by trauma and substance abuse Intact for present events   Judgement:  Other:  Much improved  Insight:   Improving Telepsych visit-Grossly normal Musculoskeletal and cranial nerve inspections   Psychomotor Activity:  Telepsych visit-Grossly normal Musculoskeletal and cranial nerve inspections   Concentration:  Concentration: Good and Attention Span: Good  Recall:   See memory   Fund of Knowledge:  WDL  Language: Good  Akathisia:  Negative  Handed:  Right  AIMS (if indicated): NA  Assets:  Communication Skills Desire for Improvement Financial Resources/Insurance Housing Leisure Time Resilience Social Support Talents/Skills Vocational/Educational  ADL's:  Intact  Cognition: Impaired,  Mild  Sleep:   with Trazodone   PDMP 06/29/2022 06/29/2022  2 Gabapentin 300 Mg Capsule 60.00 30 Mo Eds        04/20/2022 04/20/2022  1 Testosterone Micronized Powder 0.80 30 Mi Hor             Screenings: AUDIT    Flowsheet Row [facility]  Alcohol Use Disorder Identification Test Final Score (AUDIT) 18 21      GAD-7    Flowsheet Row Clinical Support from 12/07/2021 in [facility] at [facility] Counselor from 09/03/2021 in [facility] INT[facility] CHEMICAL  DEPENDENCY Office Visit from 07/30/2021 in [facility] Primary Care at [facility] Office Visit from 05/05/2021 in [facility] Primary Care at [facility] Office Visit from 12/07/2020 in [facility] Primary Care at [facility]  Total GAD-7 Score 0 3 3 6 2       PHQ2-9    Flowsheet Row Clinical Support from 12/07/2021 in [facility] Primary Care at [facility] Counselor from 09/03/2021 in BEHAVIORAL HEALTH INTENSIVE CHEMICAL DEPENDENCY Office Visit from 07/30/2021 in [facility] Primary Care at [facility] Counselor from 07/19/2021 in [facility]patient [facility]al Health at [facility] Video Visit from 05/17/2021 in [facility] Primary Care at [facility]  PHQ-2 Total Score 0 2 0 0 0  PHQ-9 Total Score 1 4 3  -- 1      Flowsheet Row ED from 02/25/2022 in [facility] Emergency Department at [facility] ED to Hosp-Admission (Discharged) from 02/15/2022 in [facility] [place] [facility] ED to Hosp-Admission (Discharged) from 01/25/2022 in [facility]  C-SSRS RISK CATEGORY No Risk No Risk No Risk        Assessment :Much improved and stable with current care and meds    and Plan: Continue Baclofen and Wellbutrin                   FU 4 mos-sooner if needed    Terri Morn, PA-C 07/14/2022, 2:09 PM

## 2022-07-29 ENCOUNTER — Other Ambulatory Visit: Payer: Self-pay | Admitting: Family Medicine

## 2022-07-29 DIAGNOSIS — F1020 Alcohol dependence, uncomplicated: Secondary | ICD-10-CM

## 2022-08-15 ENCOUNTER — Other Ambulatory Visit: Payer: Self-pay

## 2022-08-17 ENCOUNTER — Other Ambulatory Visit: Payer: Self-pay | Admitting: Family Medicine

## 2022-08-23 ENCOUNTER — Encounter: Payer: Self-pay | Admitting: Family Medicine

## 2022-08-23 ENCOUNTER — Ambulatory Visit: Payer: Medicare Other | Admitting: Family Medicine

## 2022-08-23 ENCOUNTER — Ambulatory Visit (INDEPENDENT_AMBULATORY_CARE_PROVIDER_SITE_OTHER): Payer: Medicare Other | Admitting: Family Medicine

## 2022-08-23 VITALS — BP 98/64 | HR 78 | Resp 18 | Ht 62.0 in | Wt 118.0 lb

## 2022-08-23 DIAGNOSIS — F411 Generalized anxiety disorder: Secondary | ICD-10-CM | POA: Diagnosis not present

## 2022-08-23 DIAGNOSIS — F331 Major depressive disorder, recurrent, moderate: Secondary | ICD-10-CM

## 2022-08-23 DIAGNOSIS — F1020 Alcohol dependence, uncomplicated: Secondary | ICD-10-CM

## 2022-08-23 DIAGNOSIS — R2689 Other abnormalities of gait and mobility: Secondary | ICD-10-CM | POA: Diagnosis not present

## 2022-08-23 DIAGNOSIS — I10 Essential (primary) hypertension: Secondary | ICD-10-CM

## 2022-08-23 DIAGNOSIS — G43009 Migraine without aura, not intractable, without status migrainosus: Secondary | ICD-10-CM

## 2022-08-23 NOTE — Assessment & Plan Note (Signed)
Symptoms of balance issues, abnormal movements/gait, hand weakness have greatly improved since last appointment.  MRI brain showed age-related changes but no concerning findings for ongoing disease processes.  Neurology conclusion was symptoms likely due to alcoholic neuropathy.  Patient has been doing home PT.  Recommend to continue PT exercises.  Will continue to monitor.

## 2022-08-23 NOTE — Assessment & Plan Note (Signed)
Last appointment with cardiology on 04/22/2022 with recommendation to follow-up in 1 year.  Stable, continue carvedilol 6.25 mg twice daily.  Continue ambulatory blood pressure monitoring.  Will continue to monitor.

## 2022-08-23 NOTE — Assessment & Plan Note (Signed)
Followed by [facility]. Continue Wellbutrin 150 mg daily, BuSpar 15 mg 3 times daily, trazodone 50 mg nightly.  Will continue to monitor. 

## 2022-08-23 NOTE — Assessment & Plan Note (Signed)
Followed by Vesta Mixer. Stable.  Continue Wellbutrin 150 mg daily, BuSpar 15 mg 3 times daily, trazodone 50 mg nightly.  Will continue to monitor in coordination with [facility].

## 2022-08-23 NOTE — Patient Instructions (Signed)
FOR THE PHARMACY: -Due for PCV20 pneumonia vaccine

## 2022-08-23 NOTE — Progress Notes (Signed)
Established Patient Office Visit  Subjective   Patient ID: Terri Ayala, female    DOB: 07-17-1955  Age: 67 y.o. MRN: [id number]  Chief Complaint  Patient presents with   Anxiety    HPI Terri Ayala is a 67 y.o. female presenting today for follow up of hypertension, mood, balance.  At last appointment, expressed changes in balance and motor skills.  Conducted lab work, ordered MR brain, and sent referral to neurology.  Neurology visit on 07/04/2022, MR brain reviewed with patient showing age-related changes.  Concluded that gait and balance likely contributed to by alcoholic neuropathy.  Referral sent for home PT. Hypertension:  Pt denies chest pain, SOB, dizziness, edema, syncope, fatigue or heart palpitations. Taking carvedilol, reports excellent compliance with treatment. Denies side effects.  Last appointment with cardiology on 04/22/2022 with recommendation to follow-up in 1 year. Mood: Patient is here to follow up for anxiety and depression, currently managing with BuSpar, trazodone, Wellbutrin. Taking medication without side effects, reports excellent compliance with treatment.  Medication management is done by [facility] behavioral health.    08/23/2022    1:10 PM 12/07/2021    1:29 PM 09/03/2021   12:42 PM  Depression screen PHQ 2/9  Decreased Interest 0 0   Down, Depressed, Hopeless 0 0   PHQ - 2 Score 0 0   Altered sleeping 1 0   Tired, decreased energy 1 1   Change in appetite 0 0   Feeling bad or failure about yourself  0 0   Trouble concentrating 0 0   Moving slowly or fidgety/restless 0 0   Suicidal thoughts 0 0   PHQ-9 Score 2 1   Difficult doing work/chores Somewhat difficult Not difficult at all      Information is confidential and restricted. Go to Review Flowsheets to unlock data.       08/23/2022    1:10 PM 12/07/2021    1:29 PM 09/03/2021   12:42 PM 07/30/2021   10:49 AM  GAD 7 : Generalized Anxiety Score  Nervous, Anxious, on Edge 0 0  1  Control/stop worrying 0  0  1  Worry too much - different things 1 0  1  Trouble relaxing 0 0  0  Restless 0 0  0  Easily annoyed or irritable 0 0  0  Afraid - awful might happen 0 0  0  Total GAD 7 Score 1 0  3  Anxiety Difficulty Not difficult at all Not difficult at all       Information is confidential and restricted. Go to Review Flowsheets to unlock data.   ROS Negative unless otherwise noted in HPI   Objective:     BP 98/64 (BP Location: Left Arm, Patient Position: Sitting, Cuff Size: Normal)   Pulse 78   Resp 18   Ht 5\' 2"  (1.575 m)   Wt 118 lb (53.5 kg)   SpO2 100%   BMI 21.58 kg/m   Physical Exam Constitutional:      General: She is not in acute distress.    Appearance: Normal appearance.  HENT:     Head: Normocephalic and atraumatic.  Cardiovascular:     Rate and Rhythm: Normal rate and regular rhythm.     Pulses: Normal pulses.     Heart sounds: No murmur heard.    No friction rub. No gallop.  Pulmonary:     Effort: Pulmonary effort is normal. No respiratory distress.     Breath sounds: No wheezing,  rhonchi or rales.  Musculoskeletal:     Cervical back: Normal range of motion.  Skin:    General: Skin is warm and dry.  Neurological:     General: No focal deficit present.     Mental Status: She is alert and oriented to person, place, and time. Mental status is at baseline.     Cranial Nerves: No cranial nerve deficit.  Psychiatric:        Mood and Affect: Mood normal.        Thought Content: Thought content normal.        Judgment: Judgment normal.     Assessment & Plan:  Benign essential HTN Assessment & Plan: Last appointment with cardiology on 04/22/2022 with recommendation to follow-up in 1 year.  Stable, continue carvedilol 6.25 mg twice daily.  Continue ambulatory blood pressure monitoring.  Will continue to monitor.   Generalized anxiety disorder Assessment & Plan: Followed by [facility] Mixer. Stable.  Continue Wellbutrin 150 mg daily, BuSpar 15 mg 3 times daily,  trazodone 50 mg nightly.  Will continue to monitor in coordination with [facility].   Major depressive disorder, recurrent episode, moderate (HCC) Assessment & Plan: Followed by [facility] Mixer. Continue Wellbutrin 150 mg daily, BuSpar 15 mg 3 times daily, trazodone 50 mg nightly.  Will continue to monitor.   Balance problem Assessment & Plan: Symptoms of balance issues, abnormal movements/gait, hand weakness have greatly improved since last appointment.  MRI brain showed age-related changes but no concerning findings for ongoing disease processes.  Neurology conclusion was symptoms likely due to alcoholic neuropathy.  Patient has been doing home PT.  Recommend to continue PT exercises.  Will continue to monitor.   Patient agreeable to getting PCV 20 to bring her up-to-date on all preventative care.  She is aware that she needs to go to her local pharmacy to have that done to avoid getting a large bill.  Return in about 3 months (around 11/23/2022) for follow-up for HTN, neuropathy, mood, migraine.    Melida Quitter, PA

## 2022-08-29 ENCOUNTER — Telehealth (HOSPITAL_COMMUNITY): Payer: Self-pay | Admitting: Licensed Clinical Social Worker

## 2022-08-29 NOTE — Telephone Encounter (Signed)
The therapist returns Terri Ayala's call leaving a HIPAA-compliant voicemail. [name] left a message asking about a video on medications show in [facility] wanting to know how to find the link to it.   [address], MA, LCSW, [facility], LCAS 08/29/2022

## 2022-09-01 ENCOUNTER — Encounter: Payer: Self-pay | Admitting: *Deleted

## 2022-09-01 ENCOUNTER — Ambulatory Visit
Admission: EM | Admit: 2022-09-01 | Discharge: 2022-09-01 | Disposition: A | Discharge status: Home / Self Care | Payer: Medicare Other | Attending: Internal Medicine | Admitting: Internal Medicine

## 2022-09-01 ENCOUNTER — Ambulatory Visit (INDEPENDENT_AMBULATORY_CARE_PROVIDER_SITE_OTHER): Payer: Medicare Other

## 2022-09-01 DIAGNOSIS — W19XXXA Unspecified fall, initial encounter: Secondary | ICD-10-CM

## 2022-09-01 DIAGNOSIS — M549 Dorsalgia, unspecified: Secondary | ICD-10-CM

## 2022-09-01 DIAGNOSIS — R0781 Pleurodynia: Secondary | ICD-10-CM

## 2022-09-01 NOTE — ED Provider Notes (Signed)
[facility]    CSN: [id number] Arrival date & time: 09/01/22  0959      History   Chief Complaint Chief Complaint  Patient presents with   Fall    HPI Terri Ayala is a 67 y.o. female.   Patient presents for right rib pain and back pain after a fall that occurred yesterday.  Patient reports that she was walking up the staircase using her phone when she lost her balance and fell backwards.  She states that she landed on her back.  She did slightly hit her head but denies loss of consciousness.  She does not take any blood thinning medications.  Denies shortness of breath but does endorse some painful breathing.  Reports that she is having pain in the right lower rib cage and in the mid to lower back.  She does report that she has chronic back pain as well.  Has taken Tylenol for pain.  Denies dizziness, blurred vision, headache, nausea, vomiting.  Denies urinary frequency, urinary or bowel continence, saddle anesthesia.   Fall    Past Medical History:  Diagnosis Date   Alcohol withdrawal (HCC) 11/2017; 01/04/2018   Alcoholism (HCC)    Anxiety    Arthritis    "hands" (01/04/2018)   ASYMPTOMATIC POSTMENOPAUSAL STATUS 08/17/2009   Chronic lower back pain    Depression    DYSPHAGIA 06/19/2007   Fatty liver, alcoholic    GERD (gastroesophageal reflux disease)    GOITER, MULTINODULAR 08/17/2009   History of colonic polyps 03/25/2020   Hyperlipidemia    Hypertension    Migraines    "not sure what triggers them; I'll have 1 q couple weeks or more; come 2 days in a row when they come" (01/04/2018)   Mitral valve prolapse    OSTEOPOROSIS 08/17/2009   PONV (postoperative nausea and vomiting)    Supraventricular tachycardia     Patient Active Problem List   Diagnosis Date Noted   Balance problem 05/23/2022   Abnormal movement 05/23/2022   Weakness of both hands 05/23/2022   Rosacea 05/17/2021   Seborrheic keratosis 05/17/2021   Melanocytic nevi of trunk  05/17/2021   Chronic pain of left ankle 05/12/2021   Body mass index 26.0-26.9, adult 05/12/2021   Thyroid nodule 02/10/2021   Prolonged QT interval 02/10/2021   Elevated cholesterol 10/19/2020   Hemorrhage of rectum and anus 03/25/2020   Gastroparesis 03/25/2020   Gastroesophageal reflux disease without esophagitis 03/25/2020   Elevated LFTs 07/12/2018   Leukocytosis 12/08/2017   Benign essential HTN 12/08/2017   Osteopenia 08/27/2016   Major depressive disorder, recurrent episode, moderate (HCC) 01/27/2016   Alcohol dependence with withdrawal (HCC) 01/27/2016   Uncomplicated alcohol dependence (HCC) 04/03/2014   Lumbosacral radiculopathy at L5 05/20/2013   Nonspecific elevation of levels of transaminase or lactic acid dehydrogenase (LDH) 03/16/2013   Hepatic steatosis 03/16/2013   Chronic cholecystitis 03/14/2013   Cervical arthritis 10/12/2012   Palpitations 11/29/2010   Supraventricular tachycardia 11/29/2010   Mitral valve prolapse 11/29/2010   Decreased libido 11/15/2010   Multinodular goiter 08/17/2009   Migraine 08/17/2009   Hearing loss 08/17/2009   Osteoporosis 08/17/2009   Generalized anxiety disorder 08/17/2009   Eosinophilic esophagitis 06/19/2007    Past Surgical History:  Procedure Laterality Date   BREAST BIOPSY Right    "benign"   CATARACT EXTRACTION W/ INTRAOCULAR LENS  IMPLANT, BILATERAL     CERVICAL CONE BIOPSY  2000s   CERVIX LESION DESTRUCTION  1991   "dysplasia; lesions"  CHOLECYSTECTOMY N/A 03/14/2013   Procedure: LAPAROSCOPIC CHOLECYSTECTOMY WITH ATTEMPTED INTRAOPERATIVE CHOLANGIOGRAM;  Surgeon: Shelly Rubenstein, MD;  Location: [facility] OR;  Service: General;  Laterality: N/A;   HARDWARE REMOVAL Left 07/22/2021   Procedure: HARDWARE REMOVAL;  Surgeon: Myrene Galas, MD;  Location: [facility] OR;  Service: Orthopedics;  Laterality: Left;   laporoscopic abdominal surgery     "for endometriosis"   LEFT HEART CATH AND CORONARY ANGIOGRAPHY N/A 05/07/2018    Procedure: LEFT HEART CATH AND CORONARY ANGIOGRAPHY;  Surgeon: Lyn Records, MD;  Location: [facility];  Service: Cardiovascular;  Laterality: N/A;   ORIF ANKLE FRACTURE Left 02/11/2021   Procedure: OPEN REDUCTION INTERNAL FIXATION (ORIF) ANKLE FRACTURE;  Surgeon: Myrene Galas, MD;  Location: [facility] OR;  Service: Orthopedics;  Laterality: Left;   TONSILLECTOMY      OB History   No obstetric history on file.      Home Medications    Prior to Admission medications   Medication Sig Start Date End Date Taking? Authorizing Provider  alendronate (FOSAMAX) 70 MG tablet Take 70 mg by mouth every Monday.   Yes [provider]  baclofen (LIORESAL) 10 MG tablet Take 1 tablet (10 mg total) by mouth 3 (three) times daily. 07/14/22 11/11/22 Yes Court Joy, PA-C  buPROPion (WELLBUTRIN XL) 150 MG 24 hr tablet Take 1 tablet (150 mg total) by mouth every morning. 07/14/22 11/11/22 Yes Court Joy, PA-C  busPIRone (BUSPAR) 15 MG tablet Take 1 tablet (15 mg total) by mouth 3 (three) times daily. 07/14/22  Yes Court Joy, PA-C  Calcium Carbonate-Vitamin D (CALCIUM 600+D PO) Take 1 tablet by mouth daily.   Yes [provider]  carvedilol (COREG) 6.25 MG tablet TAKE 1 TABLET (6.25 MG TOTAL) BY MOUTH 2 TIMES DAILY WITH A MEAL. 06/16/22  Yes Saralyn Pilar A, PA  fluticasone (FLONASE) 50 MCG/ACT nasal spray Place 2 sprays into both nostrils daily as needed for allergies.   Yes [provider]  fluticasone (FLOVENT HFA) 110 MCG/ACT inhaler 2 puff(s) Puff and swallow/ not for asthma, for EOE 2 times a day for 30 day(s) 11/03/21  Yes [provider]  folic acid (FOLVITE) 1 MG tablet TAKE 1 TABLET BY MOUTH DAILY. 07/29/22  Yes Saralyn Pilar A, PA  gabapentin (NEURONTIN) 300 MG capsule TAKE 1 CAPSULE BY MOUTH 2 TIMES DAILY. 06/29/22  Yes Saralyn Pilar A, PA  Multiple Vitamins-Minerals (ADULT GUMMY PO) Take 2 capsules by mouth daily.   Yes [provider]   norethindrone-ethinyl estradiol (FEMHRT 1/5) 1-5 MG-MCG TABS tablet Take 1 tablet by mouth daily.   Yes [provider]  pantoprazole (PROTONIX) 40 MG tablet TAKE 1 TABLET BY MOUTH 2 TIMES DAILY. 08/18/22  Yes Saralyn Pilar A, PA  traZODone (DESYREL) 50 MG tablet Take 1 tablet (50 mg total) by mouth at bedtime. 04/21/22 09/01/22 Yes Court Joy, PA-C  tretinoin (RETIN-A) 0.025 % cream 1 application in the evening to face Externally Once a day, PM pea size amount for 30 days 12/06/21  Yes [provider]  albuterol (VENTOLIN HFA) 108 (90 Base) MCG/ACT inhaler INHALE 2 PUFFS INTO THE LUNGS EVERY 6 HOURS AS NEEDED FOR WHEEZING OR SHORTNESS OF BREATH. Patient taking differently: Inhale 2 puffs into the lungs every 6 (six) hours as needed for wheezing or shortness of breath. 07/02/21   Mayer Masker, PA-C  meloxicam (MOBIC) 15 MG tablet Take 1 tablet (15 mg total) by mouth daily as needed for pain. 08/03/21  Mayer Masker, PA-C  SUMAtriptan (IMITREX) 100 MG tablet Take 1 tablet (100 mg total) by mouth every 2 (two) hours as needed for migraine. May repeat in 2 hours if headache persists or recurs. 12/07/21   Mayer Masker, PA-C    Family History Family History  Problem Relation Age of Onset   Colon cancer Father        Deceased, 74   Osteoporosis Mother        Living, 60   Healthy Brother    Healthy Brother    Healthy Son    Healthy Son    Thyroid disease Neg Hx    Goiter Neg Hx     Social History Social History   Tobacco Use   Smoking status: Former    Packs/day: 1.00    Years: 7.00    Additional pack years: 0.00    Total pack years: 7.00    Types: Cigarettes    Quit date: 02/28/1974    Years since quitting: 48.5   Smokeless tobacco: Never   Tobacco comments:    01/04/2018 "smoked when I was a teenager"  Vaping Use   Vaping Use: Never used  Substance Use Topics   Alcohol use: Not Currently    Alcohol/week: 28.0 standard drinks of alcohol    Types: 28  Glasses of wine per week    Comment: I had Detox on 07/13/21 and no alcohol since then   Drug use: Never     Allergies   Doxycycline, Codeine, and Tetracycline hcl   Review of Systems Review of Systems Per HPI  Physical Exam Triage Vital Signs ED Triage Vitals  Enc Vitals Group     BP 09/01/22 1012 116/62     Pulse Rate 09/01/22 1012 95     Resp 09/01/22 1012 14     Temp 09/01/22 1012 97.9 F (36.6 C)     Temp Source 09/01/22 1012 Oral     SpO2 09/01/22 1012 95 %     Weight --      Height --      Head Circumference --      Peak Flow --      Pain Score 09/01/22 1013 9     Pain Loc --      Pain Edu? --      Excl. in GC? --    No data found.  Updated Vital Signs BP 116/62   Pulse 95   Temp 97.9 F (36.6 C) (Oral)   Resp 14   SpO2 95%   Visual Acuity Right Eye Distance:   Left Eye Distance:   Bilateral Distance:    Right Eye Near:   Left Eye Near:    Bilateral Near:     Physical Exam Constitutional:      General: She is not in acute distress.    Appearance: Normal appearance. She is not toxic-appearing or diaphoretic.  HENT:     Head: Normocephalic and atraumatic.     Comments: No lacerations, swelling, abrasions noted to area of head where impact occurred. Eyes:     Extraocular Movements: Extraocular movements intact.     Conjunctiva/sclera: Conjunctivae normal.  Cardiovascular:     Rate and Rhythm: Normal rate and regular rhythm.     Pulses: Normal pulses.     Heart sounds: Normal heart sounds.  Pulmonary:     Effort: Pulmonary effort is normal. No respiratory distress.     Breath sounds: Normal breath sounds. No stridor. No wheezing, rhonchi or rales.  Chest:     Chest wall: Tenderness present.       Comments: Patient has tenderness to palpation to right lower anterior and lateral ribs.  There is no crepitus, swelling, discoloration, lacerations, abrasions noted. Musculoskeletal:     Comments: Patient has tenderness to palpation to mid  thoracic back and slightly into the right lower lumbar region.  There is an area of bruising present to right lower lumbar region that extends slightly into the thoracic back.  No abrasions or lacerations noted.  No swelling noted.  Neurological:     General: No focal deficit present.     Mental Status: She is alert and oriented to person, place, and time. Mental status is at baseline.     Deep Tendon Reflexes: Reflexes are normal and symmetric.  Psychiatric:        Mood and Affect: Mood normal.        Behavior: Behavior normal.        Thought Content: Thought content normal.        Judgment: Judgment normal.      UC Treatments / Results  Labs (all labs ordered are listed, but only abnormal results are displayed) Labs Reviewed - No data to display  EKG   Radiology DG Thoracic Spine 2 View  Result Date: 09/01/2022 CLINICAL DATA:  Fall yesterday 3-4 steps after stumble. Fall onto back. Tenderness along distal right anterior ribcage and difficulty breathing. EXAM: THORACIC SPINE 2 VIEWS COMPARISON:  Thoracic spine radiographs 06/08/2021. FINDINGS: Thoracic vertebral body heights appear preserved, without definite evidence of acute fracture. There is S shaped curvature of the thoracolumbar spine which appears increased since 2023. There is no antero or retrolisthesis. There is minimal degenerative change of the thoracic spine. IMPRESSION: No evidence of acute injury in the thoracic spine. Electronically Signed   By: Lesia Hausen M.D.   On: 09/01/2022 11:00   DG Lumbar Spine Complete  Result Date: 09/01/2022 CLINICAL DATA:  Status post fall, lumbar pain EXAM: LUMBAR SPINE - COMPLETE 4+ VIEW COMPARISON:  06/08/2021 FINDINGS: 5 nonrib bearing lumbar-type vertebral bodies. Vertebral body heights are maintained. No acute fracture. Generalized osteopenia. No static listhesis. No spondylolysis. Mild degenerative disease with disc height loss at L5-S1 with bilateral facet arthropathy. SI joints are  unremarkable. IMPRESSION: 1. Mild degenerative disease with disc height loss at L5-S1 with bilateral facet arthropathy. 2.  No acute osseous injury of the lumbar spine. Electronically Signed   By: Elige Ko M.D.   On: 09/01/2022 10:58   DG Ribs Unilateral W/Chest Right  Result Date: 09/01/2022 CLINICAL DATA:  Status post fall down stairs.  Anterior rib pain. EXAM: RIGHT RIBS AND CHEST - 3+ VIEW COMPARISON:  None Available. FINDINGS: Bilateral chronic interstitial thickening. No focal consolidation. No pleural effusion or pneumothorax. Heart and mediastinal contours are unremarkable. Chronic left posterior eighth and ninth rib fractures. No acute rib fracture. IMPRESSION: 1.  No acute osseous injury of the ribs. 2. No acute cardiopulmonary disease. Electronically Signed   By: Elige Ko M.D.   On: 09/01/2022 10:57    Procedures Procedures (including critical care time)  Medications Ordered in UC Medications - No data to display  Initial Impression / Assessment and Plan / UC Course  I have reviewed the triage vital signs and the nursing notes.  Pertinent labs & imaging results that were available during my care of the patient were reviewed by me and considered in my medical decision making (see chart for details).  X-rays are negative for any acute bony abnormality.  It did show chronic changes of the spine and discussed curvature with patient.  Suspect contusion related to patient's fall.  Encouraged ice application and supportive care. Advised following up with orthopedist at provided contact information if symptoms persist or worsen.  Patient did hit her head but there is no notable bruising or swelling on exam, no lacerations, neuroexam is normal.  Discussed with patient I do not have any capabilities to image the head here but patient declined any imaging with shared decision making.  Encouraged patient to go to the ER if she develops any headache, dizziness, blurred vision, nausea,  vomiting.  Patient and her caregiver verbalized understanding and was agreeable with plan. Final Clinical Impressions(s) / UC Diagnoses   Final diagnoses:  Fall, initial encounter  Rib pain on right side  Back pain, unspecified back location, unspecified back pain laterality, unspecified chronicity     Discharge Instructions      Nothing is broken.  Suspect bruising related to your fall as we discussed.  Use ice application and ensure you are doing deep breathing.  Follow-up with orthopedics if symptoms persist or worsen.     ED Prescriptions   None    PDMP not reviewed this encounter.   Gustavus Bryant, [place] 09/01/22 1200

## 2022-09-01 NOTE — Discharge Instructions (Signed)
Nothing is broken.  Suspect bruising related to your fall as we discussed.  Use ice application and ensure you are doing deep breathing.  Follow-up with orthopedics if symptoms persist or worsen.

## 2022-09-01 NOTE — ED Notes (Signed)
Ice pack provided

## 2022-09-01 NOTE — ED Triage Notes (Signed)
Pt reports falling yesterday afternoon down approx 3-4 steps after she stumbled. States fell onto back. C/O tenderness to right distal anterior ribcage. C/O painful breathing, but denies dyspnea. C/O generalized soreness to upper back. Denies any other injuries. Has taken acetaminophen.

## 2022-09-02 ENCOUNTER — Telehealth: Payer: Self-pay

## 2022-09-02 NOTE — Telephone Encounter (Signed)
Incoming Call:  MRN [id number] Terri Ayala was seen yesterday and stated the provider told her to call back today if she felt she needed a prescription since Tylenol is not working for her. Phone number [phone].  Returned call: LM for patient to call back if needed and/or send Mychart message to clinic and/or PCP.  B. Roten CMA

## 2022-09-02 NOTE — Telephone Encounter (Signed)
Patient called clinic regarding yesterday's visit at [facility]. States she was seen for rib pain and treating pain with tylenol/ibuprofen with no relief. Pt requesting prescription for pain control. Chart was reviewed and instructed to follow-up with Ortho per AVS. Shared with patient that provider onsite today not able to send a prescription for pain. She would need to follow-up with ortho or return to clinic for eval. Voiced understanding.

## 2022-09-13 ENCOUNTER — Encounter: Payer: Self-pay | Admitting: Sports Medicine

## 2022-09-13 ENCOUNTER — Telehealth: Payer: Self-pay | Admitting: Sports Medicine

## 2022-09-13 ENCOUNTER — Other Ambulatory Visit (INDEPENDENT_AMBULATORY_CARE_PROVIDER_SITE_OTHER): Payer: Medicare Other

## 2022-09-13 ENCOUNTER — Ambulatory Visit (INDEPENDENT_AMBULATORY_CARE_PROVIDER_SITE_OTHER): Payer: Medicare Other | Admitting: Sports Medicine

## 2022-09-13 DIAGNOSIS — S2231XA Fracture of one rib, right side, initial encounter for closed fracture: Secondary | ICD-10-CM

## 2022-09-13 DIAGNOSIS — R0781 Pleurodynia: Secondary | ICD-10-CM

## 2022-09-13 DIAGNOSIS — W19XXXS Unspecified fall, sequela: Secondary | ICD-10-CM | POA: Diagnosis not present

## 2022-09-13 DIAGNOSIS — M4125 Other idiopathic scoliosis, thoracolumbar region: Secondary | ICD-10-CM

## 2022-09-13 DIAGNOSIS — M810 Age-related osteoporosis without current pathological fracture: Secondary | ICD-10-CM | POA: Diagnosis not present

## 2022-09-13 NOTE — Progress Notes (Signed)
Fall on July 3rd down stairs Right sided rib/back pain Went to [facility] where they did xrays of T-spine States it is hard to take a deep breath  She states any movement really makes the pain increase Feels like something is "moving" especially when she bends over Ibuprofen for pain- minimal to no relief

## 2022-09-13 NOTE — Progress Notes (Signed)
Terri Ayala - 67 y.o. female MRN [id number]  Date of birth: Jan 11, 1956  Office Visit Note: Visit Date: 09/13/2022 PCP: Melida Quitter, PA Referred by: Melida Quitter, PA  Subjective: Chief Complaint  Patient presents with   Chest - Pain   HPI: Terri Ayala is a pleasant 67 y.o. female who presents today for follow-up of her rib pain.  Patient states that she has been dealing with some rib pain since a fall on 07/03.  Patient states that she went to the ER at that time and had x-rays done which did not show a rib fracture.  Patient dates that she was told she had a contusion.  Patient is presenting today because she feels like she still has some deep right-sided rib pain.  Patient states that the pain is located deep in her lower ribs.  Patient denies any shortness of breath or any chest pain.  Patient states that she does have pain whenever she takes a deep inspiration.  Patient states that the pain has been limiting her activity little bit.  Patient does have a history of meloxicam but states that whenever she takes that she gets GERD.  Patient has been taking ibuprofen currently for some of the pain.  Patient is accompanied by her caregiver.  Patient otherwise has no other concerns at this time.  Pertinent ROS were reviewed with the patient and found to be negative unless otherwise specified above in HPI.   Assessment & Plan: Visit Diagnoses:  1. Rib pain on right side   2. Closed fracture of one rib of right side, initial encounter     Plan: Rib x-rays do show a rib fracture noted at the distal end of the 10th rib on the right side. Patient's pain is likely related to rib fracture of the 10th rib.  At this time, no further intervention is needed and patient would benefit from conservative management.  Patient was advised to continue taking ibuprofen and to apply heat to the area as needed. Patient has taken meloxicam in the past but it causes GI upset, patient was advised that if  there is difficulty taking ibuprofen then we can consider Celebrex. Patient was also advised to refrain from any strenuous activity that may be causing pain.  Patient was recommended to use a lidocaine patch over the affected area.  If patient has no improvement in the next 6 to 8 weeks, patient was recommended to follow-up in the orthopedic office.  Follow-up: Return if symptoms worsen or fail to improve.   Meds & Orders: No orders of the defined types were placed in this encounter.   Orders Placed This Encounter  Procedures   XR Ribs Unilateral Right     Procedures: No procedures performed      Clinical History: No specialty comments available.  She reports that she quit smoking about 48 years ago. Her smoking use included cigarettes. She started smoking about 55 years ago. She has a 7 pack-year smoking history. She has never used smokeless tobacco. No results for input(s): "HGBA1C", "LABURIC" in the last 8760 hours.  Objective:   Vital Signs: There were no vitals taken for this visit.  Physical Exam  Gen: Well-appearing, in no acute distress; non-toxic CV: Regular Rate. Well-perfused. Warm.  Resp: Breathing unlabored on room air; no wheezing. Lungs clear to auscultation posteriorly bilateral sides.  Psych: Fluid speech in conversation; appropriate affect; normal thought process Neuro: Sensation intact throughout. No gross coordination deficits.  Ortho Exam - There is tenderness over the right rib area below the 9th rib on the right side.  There is no step-off noted.  Tenderness to deep palpation over the affected area.   Imaging: XR Ribs Unilateral Right  Result Date: 09/13/2022 Unilateral right rib 2 view x-ray including anterior and posterior view and oblique were ordered. X-rays of the ribs were independently reviewed by me, there is a noted rib fracture at the distal end of the 10th right rib.  There is no signs of rib fractures throughout the rest of the rib cage on the  right side.   Past Medical/Family/Surgical/Social History: Medications & Allergies reviewed per EMR, new medications updated. Patient Active Problem List   Diagnosis Date Noted   Balance problem 05/23/2022   Abnormal movement 05/23/2022   Weakness of both hands 05/23/2022   Rosacea 05/17/2021   Seborrheic keratosis 05/17/2021   Melanocytic nevi of trunk 05/17/2021   Chronic pain of left ankle 05/12/2021   Body mass index 26.0-26.9, adult 05/12/2021   Thyroid nodule 02/10/2021   Prolonged QT interval 02/10/2021   Elevated cholesterol 10/19/2020   Hemorrhage of rectum and anus 03/25/2020   Gastroparesis 03/25/2020   Gastroesophageal reflux disease without esophagitis 03/25/2020   Elevated LFTs 07/12/2018   Leukocytosis 12/08/2017   Benign essential HTN 12/08/2017   Osteopenia 08/27/2016   Major depressive disorder, recurrent episode, moderate (HCC) 01/27/2016   Alcohol dependence with withdrawal (HCC) 01/27/2016   Uncomplicated alcohol dependence (HCC) 04/03/2014   Lumbosacral radiculopathy at L5 05/20/2013   Nonspecific elevation of levels of transaminase or lactic acid dehydrogenase (LDH) 03/16/2013   Hepatic steatosis 03/16/2013   Chronic cholecystitis 03/14/2013   Cervical arthritis 10/12/2012   Palpitations 11/29/2010   Supraventricular tachycardia 11/29/2010   Mitral valve prolapse 11/29/2010   Decreased libido 11/15/2010   Multinodular goiter 08/17/2009   Migraine 08/17/2009   Hearing loss 08/17/2009   Osteoporosis 08/17/2009   Generalized anxiety disorder 08/17/2009   Eosinophilic esophagitis 06/19/2007   Past Medical History:  Diagnosis Date   Alcohol withdrawal (HCC) 11/2017; 01/04/2018   Alcoholism (HCC)    Anxiety    Arthritis    "hands" (01/04/2018)   ASYMPTOMATIC POSTMENOPAUSAL STATUS 08/17/2009   Chronic lower back pain    Depression    DYSPHAGIA 06/19/2007   Fatty liver, alcoholic    GERD (gastroesophageal reflux disease)    GOITER, MULTINODULAR  08/17/2009   History of colonic polyps 03/25/2020   Hyperlipidemia    Hypertension    Migraines    "not sure what triggers them; I'll have 1 q couple weeks or more; come 2 days in a row when they come" (01/04/2018)   Mitral valve prolapse    OSTEOPOROSIS 08/17/2009   PONV (postoperative nausea and vomiting)    Supraventricular tachycardia    Family History  Problem Relation Age of Onset   Colon cancer Father        Deceased, 74   Osteoporosis Mother        Living, 8   Healthy Brother    Healthy Brother    Healthy Son    Healthy Son    Thyroid disease Neg Hx    Goiter Neg Hx    Past Surgical History:  Procedure Laterality Date   BREAST BIOPSY Right    "benign"   CATARACT EXTRACTION W/ INTRAOCULAR LENS  IMPLANT, BILATERAL     CERVICAL CONE BIOPSY  2000s   CERVIX LESION DESTRUCTION  1991   "dysplasia; lesions"  CHOLECYSTECTOMY N/A 03/14/2013   Procedure: LAPAROSCOPIC CHOLECYSTECTOMY WITH ATTEMPTED INTRAOPERATIVE CHOLANGIOGRAM;  Surgeon: Shelly Rubenstein, MD;  Location: [facility];  Service: General;  Laterality: N/A;   HARDWARE REMOVAL Left 07/22/2021   Procedure: HARDWARE REMOVAL;  Surgeon: Myrene Galas, MD;  Location: [facility];  Service: Orthopedics;  Laterality: Left;   laporoscopic abdominal surgery     "for endometriosis"   LEFT HEART CATH AND CORONARY ANGIOGRAPHY N/A 05/07/2018   Procedure: LEFT HEART CATH AND CORONARY ANGIOGRAPHY;  Surgeon: Lyn Records, MD;  Location: [facility];  Service: Cardiovascular;  Laterality: N/A;   ORIF ANKLE FRACTURE Left 02/11/2021   Procedure: OPEN REDUCTION INTERNAL FIXATION (ORIF) ANKLE FRACTURE;  Surgeon: Myrene Galas, MD;  Location: [facility];  Service: Orthopedics;  Laterality: Left;   TONSILLECTOMY     Social History   Occupational History   Occupation: Hydrographic surveyor - retired    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[employer]  Tobacco Use   Smoking status: Former    Current packs/day: 0.00    Average packs/day: 1 pack/day for 7.0 years  (7.0 ttl pk-yrs)    Types: Cigarettes    Start date: 03/01/1967    Quit date: 02/28/1974    Years since quitting: 48.5   Smokeless tobacco: Never   Tobacco comments:    01/04/2018 "smoked when I was a teenager"  Vaping Use   Vaping status: Never Used  Substance and Sexual Activity   Alcohol use: Not Currently    Alcohol/week: 28.0 standard drinks of alcohol    Types: 28 Glasses of wine per week    Comment: I had Detox on 07/13/21 and no alcohol since then   Drug use: Never   Sexual activity: Not on file

## 2022-09-13 NOTE — Telephone Encounter (Signed)
Pt would like to know if there is something she can get for sleep being she is in pain from the Fx rib please advise

## 2022-09-21 ENCOUNTER — Other Ambulatory Visit: Payer: Self-pay | Admitting: Nurse Practitioner

## 2022-09-21 DIAGNOSIS — G43009 Migraine without aura, not intractable, without status migrainosus: Secondary | ICD-10-CM

## 2022-09-22 ENCOUNTER — Other Ambulatory Visit (HOSPITAL_COMMUNITY): Payer: Self-pay | Admitting: Medical

## 2022-09-22 ENCOUNTER — Other Ambulatory Visit (HOSPITAL_COMMUNITY): Payer: Self-pay | Admitting: Nurse Practitioner

## 2022-09-22 NOTE — Telephone Encounter (Signed)
sHE HAS rX TO END 11/11/2022

## 2022-09-26 ENCOUNTER — Telehealth: Payer: Self-pay | Admitting: *Deleted

## 2022-09-26 ENCOUNTER — Other Ambulatory Visit: Payer: Self-pay | Admitting: Obstetrics and Gynecology

## 2022-09-26 DIAGNOSIS — R928 Other abnormal and inconclusive findings on diagnostic imaging of breast: Secondary | ICD-10-CM

## 2022-09-26 NOTE — Telephone Encounter (Signed)
Patient calling stating that she wanted to get a message to [place], she said to let her know that the symptoms they talked about have come back.  She said that she is stumbling, balance is off, falling, memory issues.  Please advise.  She said she has already seen neurology for this.

## 2022-09-27 NOTE — Telephone Encounter (Signed)
Appears she has had an extensive workup for this.  I do not know of any treatment options I could add based on the chart review.  I think physical therapy was recommended.  Make sure patient is going to physical therapy.  If she would like me to evaluate her further I would need to see her in the office.

## 2022-09-27 NOTE — Telephone Encounter (Signed)
Pt just replied ok but didn't schedule an appt.

## 2022-09-27 NOTE — Telephone Encounter (Signed)
LVM for pt to call office to inform her of below.  

## 2022-09-28 ENCOUNTER — Other Ambulatory Visit: Payer: Self-pay | Admitting: Family Medicine

## 2022-09-28 DIAGNOSIS — F1020 Alcohol dependence, uncomplicated: Secondary | ICD-10-CM

## 2022-10-06 ENCOUNTER — Ambulatory Visit
Admission: RE | Admit: 2022-10-06 | Discharge: 2022-10-06 | Disposition: A | Discharge status: Home / Self Care | Payer: Medicare Other | Source: Ambulatory Visit | Attending: Obstetrics and Gynecology | Admitting: Obstetrics and Gynecology

## 2022-10-06 DIAGNOSIS — R928 Other abnormal and inconclusive findings on diagnostic imaging of breast: Secondary | ICD-10-CM

## 2022-10-12 ENCOUNTER — Ambulatory Visit
Admission: RE | Admit: 2022-10-12 | Discharge: 2022-10-12 | Disposition: A | Discharge status: Home / Self Care | Payer: Medicare Other | Source: Ambulatory Visit | Attending: Obstetrics and Gynecology | Admitting: Obstetrics and Gynecology

## 2022-10-17 ENCOUNTER — Encounter: Payer: Self-pay | Admitting: Family Medicine

## 2022-10-17 ENCOUNTER — Ambulatory Visit (INDEPENDENT_AMBULATORY_CARE_PROVIDER_SITE_OTHER): Payer: Medicare Other | Admitting: Family Medicine

## 2022-10-17 VITALS — BP 102/62 | HR 100 | Resp 18 | Ht 62.0 in | Wt 117.0 lb

## 2022-10-17 DIAGNOSIS — G3184 Mild cognitive impairment, so stated: Secondary | ICD-10-CM | POA: Diagnosis not present

## 2022-10-17 DIAGNOSIS — R2689 Other abnormalities of gait and mobility: Secondary | ICD-10-CM | POA: Diagnosis not present

## 2022-10-17 NOTE — Assessment & Plan Note (Signed)
Previous MRI brain showed age-related changes but no concerning findings for ongoing disease processes.  Neurology conclusion was symptoms likely due to alcoholic neuropathy.  Patient has been doing home PT.  Recommend to continue PT exercises.  Additionally, patient agreeable to discussing current medications with [facility] to ensure that these are not worsening her balance and memory issues.  We may also consider changing blood pressure medication, though I am hesitant to do so given her heart rate of 100 in the office today.

## 2022-10-17 NOTE — Assessment & Plan Note (Signed)
Neurologist suspected that mild cognitive impairment most likely secondary to anxiety and depression.  I encouraged the patient to discuss her current psychiatric medications with [facility] to ensure that these medications are not worsening her symptoms.  Otherwise, it will be important to engage in cognitively stimulating activities to improve/maintain memory.

## 2022-10-17 NOTE — Progress Notes (Signed)
Acute Office Visit  Subjective:     Patient ID: Terri Ayala, female    DOB: June 25, 1955, 67 y.o.   MRN: [id number]  Chief Complaint  Patient presents with   Fall   Gait Problem   Memory Loss         HPI Patient is in today for ongoing issues with balance, motor skills, memory.  Initially presented to PCP on 05/23/2022 complaining of memory decline, worsening balance, extremity weakness, increase in falls.  Referral to neurology with labs and MRI ordered prior to visit. Neurology visit on 07/04/2022, MR brain reviewed with patient showing age-related changes. Concluded that gait and balance likely contributed to by alcoholic neuropathy. Referral sent for home PT. patient experienced an improvement in symptoms, but over the past month or so notes that balance has decreased, she struggles with motor skills such as grip strength, and finds that she is often disoriented, lost, or forgetful.  Review of Systems  Constitutional:  Negative for chills, fever and malaise/fatigue.  HENT:  Negative for ear pain and hearing loss.   Eyes:  Negative for blurred vision.  Respiratory:  Negative for shortness of breath.   Cardiovascular:  Negative for chest pain and palpitations.  Musculoskeletal:  Positive for falls. Negative for neck pain.  Neurological:  Positive for weakness. Negative for dizziness and headaches.  Psychiatric/Behavioral:  Positive for memory loss.       Objective:    BP 102/62 (BP Location: Left Arm, Patient Position: Sitting, Cuff Size: Normal)   Pulse 100   Resp 18   Ht 5\' 2"  (1.575 m)   Wt 117 lb (53.1 kg)   SpO2 100%   BMI 21.40 kg/m   Physical Exam Constitutional:      General: She is not in acute distress.    Appearance: Normal appearance.  HENT:     Head: Normocephalic and atraumatic.  Cardiovascular:     Rate and Rhythm: Normal rate and regular rhythm.     Heart sounds: No murmur heard.    No friction rub. No gallop.  Pulmonary:     Effort: Pulmonary  effort is normal. No respiratory distress.     Breath sounds: No wheezing, rhonchi or rales.  Musculoskeletal:        General: Normal range of motion.  Skin:    General: Skin is warm and dry.  Neurological:     Mental Status: She is alert and oriented to person, place, and time.     Cranial Nerves: Cranial nerves 2-12 are intact.     Motor: No weakness or tremor.       Assessment & Plan:  Balance problem Assessment & Plan: Previous MRI brain showed age-related changes but no concerning findings for ongoing disease processes.  Neurology conclusion was symptoms likely due to alcoholic neuropathy.  Patient has been doing home PT.  Recommend to continue PT exercises.  Additionally, patient agreeable to discussing current medications with [facility] to ensure that these are not worsening her balance and memory issues.  We may also consider changing blood pressure medication, though I am hesitant to do so given her heart rate of 100 in the office today.   Mild cognitive impairment Assessment & Plan: Neurologist suspected that mild cognitive impairment most likely secondary to anxiety and depression.  I encouraged the patient to discuss her current psychiatric medications with [facility] to ensure that these medications are not worsening her symptoms.  Otherwise, it will  be important to engage in cognitively stimulating activities to improve/maintain memory.    Return in about 5 weeks (around 11/23/2022) for follow-up (already scheduled).  I spent 30 minutes on the day of the encounter to include pre-visit record review and face-to-face time with the patient.  Melida Quitter, PA

## 2022-10-17 NOTE — Patient Instructions (Addendum)
After looking at your blood pressure at your last several office visits, I would actually like to keep your blood pressure medicine where it is for now.  Once you have a chance to talk with [facility] about if the medications they prescribe may be contributing to your symptoms, we will evaluate what her next step may be at your appointment next month.  If you have not already scheduled with home physical therapy as recommended by neurology, I would definitely do so as soon as you can!

## 2022-11-02 ENCOUNTER — Other Ambulatory Visit: Payer: Self-pay | Admitting: Family Medicine

## 2022-11-02 DIAGNOSIS — R0602 Shortness of breath: Secondary | ICD-10-CM

## 2022-11-10 ENCOUNTER — Other Ambulatory Visit (HOSPITAL_COMMUNITY): Payer: Self-pay | Admitting: *Deleted

## 2022-11-10 ENCOUNTER — Other Ambulatory Visit (HOSPITAL_COMMUNITY): Payer: Self-pay | Admitting: Medical

## 2022-11-10 MED ORDER — BUPROPION HCL ER (XL) 150 MG PO TB24: 150.0000 mg | ORAL_TABLET | ORAL | 0 refills | Status: DC

## 2022-11-10 NOTE — Telephone Encounter (Signed)
Needs appointment

## 2022-11-16 ENCOUNTER — Other Ambulatory Visit: Payer: Self-pay | Admitting: Family Medicine

## 2022-11-17 ENCOUNTER — Encounter (HOSPITAL_COMMUNITY): Payer: Self-pay | Admitting: Medical

## 2022-11-17 ENCOUNTER — Telehealth (HOSPITAL_COMMUNITY): Payer: Medicare Other | Admitting: Medical

## 2022-11-17 DIAGNOSIS — E8889 Other specified metabolic disorders: Secondary | ICD-10-CM

## 2022-11-17 DIAGNOSIS — F1291 Cannabis use, unspecified, in remission: Secondary | ICD-10-CM | POA: Diagnosis not present

## 2022-11-17 DIAGNOSIS — L718 Other rosacea: Secondary | ICD-10-CM

## 2022-11-17 DIAGNOSIS — F1021 Alcohol dependence, in remission: Secondary | ICD-10-CM

## 2022-11-17 DIAGNOSIS — Z8659 Personal history of other mental and behavioral disorders: Secondary | ICD-10-CM

## 2022-11-17 DIAGNOSIS — G928 Other toxic encephalopathy: Secondary | ICD-10-CM

## 2022-11-17 DIAGNOSIS — Z8781 Personal history of (healed) traumatic fracture: Secondary | ICD-10-CM

## 2022-11-17 DIAGNOSIS — F4312 Post-traumatic stress disorder, chronic: Secondary | ICD-10-CM | POA: Diagnosis not present

## 2022-11-17 DIAGNOSIS — Z8669 Personal history of other diseases of the nervous system and sense organs: Secondary | ICD-10-CM

## 2022-11-17 DIAGNOSIS — T1490XA Injury, unspecified, initial encounter: Secondary | ICD-10-CM

## 2022-11-17 DIAGNOSIS — R945 Abnormal results of liver function studies: Secondary | ICD-10-CM

## 2022-11-17 DIAGNOSIS — Z9889 Other specified postprocedural states: Secondary | ICD-10-CM

## 2022-11-17 DIAGNOSIS — K709 Alcoholic liver disease, unspecified: Secondary | ICD-10-CM

## 2022-11-17 DIAGNOSIS — Z62819 Personal history of unspecified abuse in childhood: Secondary | ICD-10-CM

## 2022-11-17 DIAGNOSIS — F1029 Alcohol dependence with unspecified alcohol-induced disorder: Secondary | ICD-10-CM

## 2022-11-17 DIAGNOSIS — Z79899 Other long term (current) drug therapy: Secondary | ICD-10-CM

## 2022-11-17 MED ORDER — TRAZODONE HCL 50 MG PO TABS: 50.0000 mg | ORAL_TABLET | Freq: Every day | ORAL | 0 refills | Status: DC

## 2022-11-17 MED ORDER — BACLOFEN 10 MG PO TABS: 10.0000 mg | ORAL_TABLET | Freq: Three times a day (TID) | ORAL | 2 refills | Status: AC

## 2022-11-17 MED ORDER — BUPROPION HCL ER (XL) 150 MG PO TB24: 150.0000 mg | ORAL_TABLET | ORAL | 0 refills | Status: DC

## 2022-11-17 NOTE — Progress Notes (Signed)
BH MD/PA/NP OP Progress Note  11/17/2022 2:27 PM  Terri Ayala  MRN:  [id number] Virtual Visit via Video Note  I connected with Terri Ayala on 11/17/22 at  2:00 PM EDT by a video enabled telemedicine application and verified that I am speaking with the correct person using two identifiers.  Location: Patient: Home Provider: [facility]   I discussed the limitations of evaluation and management by telemedicine and the availability of in person appointments. The patient expressed understanding and agreed to proceed.   History of Present Illness:See EPIC note    Observations/Objective:See EPIC note   Assessment and Plan:See EPIC note   Follow Up Instructions:See EPIC note    I discussed the assessment and treatment plan with the patient. The patient was provided an opportunity to ask questions and all were answered. The patient agreed with the plan and demonstrated an understanding of the instructions.   The patient was advised to call back or seek an in-person evaluation if the symptoms worsen or if the condition fails to improve as anticipated.  I provided 20 minutes of non-face-to-face time during this encounter.   [name], PA-C   Chief Complaint:  Chief Complaint  Patient presents with   Follow-up   Alcohol Problem   Trauma   Stress   Anxious depression   HPI: Terri Ayala returns for FU for her Alcohol dependence S/P CD IOP She was discharged 12/15/2021 She had missed her previous FU appointments starting with 1/04/ 2024 due to mental and physical complications from use of THC which "I thought I could handle " being off alcohol :She requited hospitalization for stabilization. When seen again in May she did wish to continue her anticraving MAT with Baclofen and her Wellbutrin for her Dysthymia related to trauma history She was scheduled for 4 month FU. Since that visit she has been followed by Melida Quitter, PA Family Medicine for symptoms of balance issues, abnormal  movements/gait, hand weakness that have greatly improved . MRI brain showed age-related changes but no concerning findings for ongoing disease processes. Neurology conclusion was symptoms likely due to alcoholic neuropathy. Patient has been doing home PT. Recommend to continue PT exercises. Will continue to monitor  On July 3 she had a fall and was seen 7/4 at [facility] Patient reports that she was walking up the staircase using her phone when she lost her balance and fell backwards.   Reported that she is having pain in the right lower rib cage and in the mid to lower back. She does report that she has chronic back pain as well  Saw Madelyn Brunner, DO Primary Care Sports Medicine Physician [facility] - Orthopedics : In summary, a very pleasant 67 year-old female who presents with some acute on chronic low back pain as well as right-sided rib/thoracic pain after a fall about 2 weeks ago. Did independent review prior to the thoracic x-ray, complete lumbar x-ray, and right rib x-ray from 09/01/2022. We did repeat x-ray imaging of the ribs today which do show minimally displaced fracture at the tip of the right 10th rib. She also has chronic appearing eighth and ninth rib fractures which appear old.   On 09/26/2022 she called  stating that she wanted to get a message to [place], she said to let her know that the symptoms they talked about have come back. She said that she is stumbling, balance is off, falling, memory issues. Please advise. She said she has already seen  neurology for this.  On 8/19 PA note: Assessment & Plan:  Balance problem Assessment & Plan: Previous MRI brain showed age-related changes but no concerning findings for ongoing disease processes.  Neurology conclusion was symptoms likely due to alcoholic neuropathy.  Patient has been doing home PT.  Recommend to continue PT exercises.  Additionally, patient agreeable to discussing current medications with [facility] to ensure that these are not worsening her balance and memory issues.  We may also consider changing blood pressure medication, though I am hesitant to do so given her heart rate of 100 in the office today.  Mild cognitive impairment Assessment & Plan: Neurologist suspected that mild cognitive impairment most likely secondary to anxiety and depression.  I encouraged the patient to discuss her current psychiatric medications with [facility] to ensure that these medications are not worsening her symptoms.  Otherwise, it will be important to engage in cognitively stimulating activities to improve/maintain memory.  Return in about 5 weeks (around 11/23/2022) for follow-up (already scheduled).  ADDENDUM After looking at your blood pressure at your last several office visits, I would actually like to keep your blood pressure medicine where it is for now.  Once you have a chance to talk with [facility] [place] [facility] about if the medications they prescribe may be contributing to your symptoms, we will evaluate what her next step may be at your appointment next month.  If you have not already scheduled with home physical therapy as recommended by neurology, I would definitely do so as soon as you can! Neurologist suspected that mild cognitive impairment most likely secondary to anxiety and depression.  I encouraged the patient to discuss her current psychiatric medications with [facility]  health to ensure that these medications are not worsening her symptoms.  Otherwise, it will be important to engage in cognitively stimulating activities to improve/maintain memory.   She denies any further use of THC and has kept her sobriety date from treatment. She wants to continue her MAT with Baclofen and refill her trazodone and Wellbutrin.   Visit Diagnosis:    ICD-10-CM   1. Alcohol use disorder, severe, in sustained remission, dependence (HCC)   F10.21     2. Cannabis use disorder in remission  F12.91     3. Trauma in childhood  T14.90XA     4. Chronic post-traumatic stress disorder (PTSD)  F43.12     5. History of major depression  Z86.59     6. Alcoholic liver disorder (HCC)  K70.9     7. Status post open reduction with internal fixation (ORIF) of fracture of ankle  Z98.890    Z87.81     8. Hx of migraines  Z86.69     9. Rosacea keratitis  L71.8     10. Inherited disorder of folate metabolism (HCC)  E88.89     11. Medication management  Z79.899     12. Drug-induced encephalopathy  G92.8     13. 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 Abnormal liver function  R94.5      Past Psychiatric History:  Drug Induced Encephalopathy  Admit date: 02/15/2022 Discharge date:   02/25/2022   [facility] visit [facility] 2017   [facility] CDIOP Date of Admission: 08/18/2021 Date of Discharge: 10/19-20/2023        Past Medical History:  Care Everywhere Review; Dermatology visits  Past  Medical History:  Diagnosis Date   Alcohol withdrawal (HCC) 11/2017; 01/04/2018   Alcoholism (HCC)    Anxiety    Arthritis    "hands" (01/04/2018)   ASYMPTOMATIC POSTMENOPAUSAL STATUS 08/17/2009   Chronic lower back pain    Depression    DYSPHAGIA 06/19/2007   Fatty liver, alcoholic    GERD (gastroesophageal reflux disease)    GOITER, MULTINODULAR 08/17/2009   History of colonic polyps 03/25/2020   Hyperlipidemia    Hypertension    Migraines    "not sure what triggers them; I'll have 1 q couple weeks or more; come 2 days in a row when they come" (01/04/2018)   Mitral valve prolapse    OSTEOPOROSIS 08/17/2009   PONV (postoperative nausea and vomiting)    Supraventricular tachycardia     Past Surgical History:  Procedure Laterality Date   BREAST BIOPSY Right    "benign"   CATARACT EXTRACTION W/ INTRAOCULAR LENS  IMPLANT, BILATERAL     CERVICAL CONE BIOPSY  2000s    CERVIX LESION DESTRUCTION  1991   "dysplasia; lesions"   CHOLECYSTECTOMY N/A 03/14/2013   Procedure: LAPAROSCOPIC CHOLECYSTECTOMY WITH ATTEMPTED INTRAOPERATIVE CHOLANGIOGRAM;  Surgeon: Shelly Rubenstein, MD;  Location: [facility] OR;  Service: General;  Laterality: N/A;   HARDWARE REMOVAL Left 07/22/2021   Procedure: HARDWARE REMOVAL;  Surgeon: Myrene Galas, MD;  Location: [facility] OR;  Service: Orthopedics;  Laterality: Left;   laporoscopic abdominal surgery     "for endometriosis"   LEFT HEART CATH AND CORONARY ANGIOGRAPHY N/A 05/07/2018   Procedure: LEFT HEART CATH AND CORONARY ANGIOGRAPHY;  Surgeon: Lyn Records, MD;  Location: [facility] INVASIVE CV LAB;  Service: Cardiovascular;  Laterality: N/A;   ORIF ANKLE FRACTURE Left 02/11/2021   Procedure: OPEN REDUCTION INTERNAL FIXATION (ORIF) ANKLE FRACTURE;  Surgeon: Myrene Galas, MD;  Location: [facility] OR;  Service: Orthopedics;  Laterality: Left;   TONSILLECTOMY      Family Psychiatric History:  Maternal uncle alcoholic    Family History:       Family History  Problem Relation Age of Onset   Colon cancer Father          Deceased, 34   Osteoporosis Mother          Living, 61   Healthy Brother     Healthy Brother     Healthy Son     Healthy Son     Thyroid disease Neg Hx     Goiter Neg Hx            Social History:  Social History    Socioeconomic History          Marital status: Single      Spouse name: NA   Number of children: None   Years of education: 16   Highest education level: BS Biology degree  Occupational History   Occupation: Hydrographic surveyor - retired      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[place] [employer]  Tobacco Use   Smoking status: Former      Packs/day: 1.00      Years: 7.00      Pack years: 7.00      Types: Cigarettes      Quit date: 02/28/1974      Years since quitting: 47.3   Smokeless tobacco: Never   Tobacco comments:      01/04/2018 "smoked when I was a teenager"  Vaping Use   Vaping Use: Never used  Substance and Sexual  Activity   Alcohol use: Yes  Alcohol/week: 1-3 bottles of wine daily      Types: Wine      Comment: Detox 06/2021 CD IOP 6/23-8/25/2023   Drug use: 2023/2024 CBD Gummies resulting in Metabolic encephalitis   Sexual activity: Not Currently  Other Topics Concern   Mr. Soha Thorup who has POA. (Brother)  Social History Narrative    Lives alone.  She has a BS biology.    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      Financial Resource Strain: No  Food Insecurity: No  Transportation Needs: Insurance provides for Medical care  Physical Activity: Ankle fx limits  Stress: Cravings/Intoxication with falls  Social Connections: Home Health/ Brother      Allergies:  Allergies       Allergies  Allergen Reactions   Doxycycline Swelling      Mouth swelling and sores   Other Reaction(s): Not available   Codeine Itching      Other Reaction(s): Not available   Tetracycline Hcl Swelling      Mouth swelling and sores       Therapeutic Level Labs:NA   Metabolic Disorder Labs: Lab Results  Component Value Date   HGBA1C 4.7 (L) 11/21/2020   MPG 88.19 11/21/2020   MPG 97 03/19/2013   No results found for: "PROLACTIN" Lab Results  Component Value Date   CHOL 193 04/22/2022   TRIG 104 04/22/2022   HDL 49 04/22/2022   CHOLHDL 3.9 04/22/2022   VLDL 10 10/04/2012   LDLCALC 125 (H) 04/22/2022   LDLCALC 168 (H) 12/15/2021   Lab Results  Component Value Date   TSH 3.018 02/16/2022   TSH 1.753 01/26/2022    Current Medications: Current Outpatient Medications  Medication Sig Dispense Refill   albuterol (VENTOLIN HFA) 108 (90 Base) MCG/ACT inhaler INHALE 2 PUFFS INTO THE LUNGS EVERY 6 HOURS AS NEEDED FOR WHEEZING OR SHORTNESS OF BREATH. 8.5 g 2   alendronate (FOSAMAX) 70 MG tablet Take 70 mg by mouth every Monday.     baclofen (LIORESAL) 10 MG tablet Take 1 tablet (10 mg total) by mouth 3 (three) times daily. 90 each 2   buPROPion (WELLBUTRIN XL) 150  MG 24 hr tablet Take 1 tablet (150 mg total) by mouth every morning. 7 tablet 0   busPIRone (BUSPAR) 15 MG tablet Take 1 tablet (15 mg total) by mouth 3 (three) times daily. 90 tablet 2   Calcium Carbonate-Vitamin D (CALCIUM 600+D PO) Take 1 tablet by mouth daily.     carvedilol (COREG) 6.25 MG tablet TAKE 1 TABLET (6.25 MG TOTAL) BY MOUTH 2 TIMES DAILY WITH A MEAL. 180 tablet 1   fluticasone (FLONASE) 50 MCG/ACT nasal spray Place 2 sprays into both nostrils daily as needed for allergies.     fluticasone (FLOVENT HFA) 110 MCG/ACT inhaler 2 puff(s) Puff and swallow/ not for asthma, for EOE 2 times a day for 30 day(s)     folic acid (FOLVITE) 1 MG tablet TAKE 1 TABLET BY MOUTH DAILY. 30 tablet 1   gabapentin (NEURONTIN) 300 MG capsule TAKE 1 CAPSULE BY MOUTH 2 TIMES DAILY. 60 capsule 2   meloxicam (MOBIC) 15 MG tablet Take 1 tablet (15 mg total) by mouth daily as needed for pain. 30 tablet 1   Multiple Vitamins-Minerals (ADULT GUMMY PO) Take 2 capsules by mouth daily.     norethindrone-ethinyl estradiol (FEMHRT 1/5) 1-5 MG-MCG TABS tablet Take 1 tablet by mouth daily.     pantoprazole (PROTONIX) 40  MG tablet TAKE 1 TABLET BY MOUTH 2 TIMES DAILY. 180 tablet 0   SUMAtriptan (IMITREX) 100 MG tablet TAKE 1 TABLET (100 MG TOTAL) BY MOUTH AS NEEDED FOR MIGRAINE. MAY REPEAT IN 2 HOURS IF HEADACHE PERSISTS OR RECURS. 15 tablet 1   traZODone (DESYREL) 50 MG tablet Take 1 tablet (50 mg total) by mouth at bedtime. 90 tablet 0   tretinoin (RETIN-A) 0.025 % cream 1 application in the evening to face Externally Once a day, PM pea size amount for 30 days     No current facility-administered medications for this visit.     Musculoskeletal: Strength & Muscle Tone: Telepsych visit-Grossly normal Musculoskeletal and cranial nerve inspections Gait & Station: NA Patient leans: N/A:   Psychiatric Specialty Exam: Review of Systems  Constitutional:  Negative for activity change, appetite change, chills,  diaphoresis, fatigue, fever and unexpected weight change.  HENT:  Negative for congestion, postnasal drip, rhinorrhea, sinus pressure, sinus pain, sneezing and sore throat.   Respiratory:  Negative for cough, shortness of breath, wheezing and stridor.   Cardiovascular:  Negative for chest pain, palpitations and leg swelling.  Gastrointestinal:  Positive for abdominal pain. Negative for abdominal distention, anal bleeding, blood in stool, constipation, diarrhea, nausea, rectal pain and vomiting.       GERD  Endocrine: Negative for cold intolerance, heat intolerance, polydipsia, polyphagia and polyuria.  Musculoskeletal:  Positive for arthralgias, back pain (Chronic) and myalgias. Negative for gait problem, joint swelling, neck pain and neck stiffness.  Skin:  Positive for color change and rash. Negative for pallor and wound.       RosACEA  Neurological:  Positive for light-headedness and headaches (hX OF MIGRAINES). Negative for dizziness, tremors, seizures, syncope, facial asymmetry, speech difficulty, weakness and numbness.  Psychiatric/Behavioral:  Positive for agitation, dysphoric mood and sleep disturbance (tRAZODONE rx). Negative for behavioral problems, confusion, decreased concentration, hallucinations and self-injury. The patient is nervous/anxious. The patient is not hyperactive.   All other systems reviewed and are negative.   There were no vitals taken for this visit.There is no height or weight on file to calculate BMI.My Chart Office Visit on 10/17/2022 Vitals: BP 102/62 (BP Location: Left Arm, Patient Position: Sitting, Cuff Size: Normal)   Pulse 100   Resp 18   Ht 5\' 2"  (1.575 m)   Wt 117 lb (53.1 kg)   SpO2 100%   BMI 21.40 kg/m   BSA 1.52 m   Pain [place] 0-No pain    General Appearance: Casual  Eye Contact:  Good  Speech:  Clear and Coherent and Normal Rate   Volume:  Normal  Mood:  Euthymic  Affect:  Congruent  Thought Process:  Coherent, Goal Directed, and  Descriptions of Associations: Intact  Orientation:  Full (Time, Place, and Person)  Thought Content: WDL and Logical   Suicidal Thoughts:  No  Homicidal Thoughts:  No  Memory:   affected by aging and anxiety and alcoholism  Judgement:  Fair  Insight:  Fair  Psychomotor Activity:  Negative  Concentration:  Concentration: Good and Attention Span: Good  Recall:   see memory  Fund of Knowledge:  WDL  Language: Good  Akathisia:  NA  Handed:  Right  AIMS (if indicated): NA  Assets:  Desire for Improvement Financial Resources/Insurance Housing Leisure Time Resilience Social Support Talents/Skills Transportation  ADL's:  Intact  Cognition: appars to be normal for age and circumstances  Sleep:   Rx Trazodone   Screenings: AUDIT    Loss adjuster, chartered  [facility]) [facility]  Alcohol Use Disorder Identification Test Final Score (AUDIT) 18 21      GAD-7    Flowsheet Row Office Visit from 10/17/2022 in [facility] at [facility] Visit from 08/23/2022 in [facility] Care at [facility] Clinical Support from 12/07/2021 in [facility] Primary Care at [facility] Counselor from 09/03/2021 in [facility] Office Visit from 07/30/2021 in [facility] Primary Care at [facility]  Total GAD-7 Score 4 1 0 3 3      PHQ2-9    Flowsheet Row Office Visit from 10/17/2022 in [facility] Primary Care at [facility] Office Visit from 08/23/2022 in [facility] Primary Care at [facility] Clinical Support from 12/07/2021 in [facility] Primary Care at [facility] Counselor from 09/03/2021 in BE[facility]AVIORAL HE[facility] INTENSIVE CHEMICAL DEPENDENCY Office Visit from 07/30/2021 in [facility] Primary Care at [facility]  PHQ-2 Total Score 1 0 0 2 0  PHQ-9 Total Score 5 2 1 4 3       Flowsheet Row ED from 09/01/2022 in  [facility]) ED from 02/25/2022 in [facility] Emergency Department at [facility] ED to Hosp-Admission (Discharged) from 02/15/2022 in [facility]  C-SSRS RISK CATEGORY No Risk No Risk No Risk        Assessment ; Remains sober.Health conscious worries/anxieties.   and Plan: Refilled meds from here.She will discuss meds with [facility] to discern timimg andcombinations in rlation to her symptoms. FU 4 mos-soner if needed  Collaboration of Care: Collaboration of Care: Primary and Psyhiatry  [name], PA-C 11/17/2022, 2:27 PM

## 2022-11-22 ENCOUNTER — Other Ambulatory Visit: Payer: Self-pay | Admitting: Family Medicine

## 2022-11-22 DIAGNOSIS — F1020 Alcohol dependence, uncomplicated: Secondary | ICD-10-CM

## 2022-11-23 ENCOUNTER — Other Ambulatory Visit (HOSPITAL_COMMUNITY): Payer: Self-pay

## 2022-11-23 ENCOUNTER — Encounter: Payer: Self-pay | Admitting: Family Medicine

## 2022-11-23 ENCOUNTER — Ambulatory Visit: Payer: Medicare Other | Admitting: Family Medicine

## 2022-11-23 MED ORDER — BUPROPION HCL ER (XL) 150 MG PO TB24: 150.0000 mg | ORAL_TABLET | ORAL | 0 refills | Status: DC

## 2022-11-28 ENCOUNTER — Telehealth: Payer: Self-pay | Admitting: *Deleted

## 2022-11-28 NOTE — Telephone Encounter (Signed)
May have been an automated message to remind patient of appointment.

## 2022-11-28 NOTE — Telephone Encounter (Signed)
Pt calling stating that she missed a phone call from [place].  I did not see any messages.  Informed her that I would send message to PCP as well as clinical staff to see if they tried to reach out to patient.

## 2022-11-28 NOTE — Telephone Encounter (Signed)
I have not reached out to her recently.  It is possible that she received one of the automated phone calls as a reminder for her appointment on 11/23/2022 for which she was a no show.

## 2022-12-08 ENCOUNTER — Encounter: Payer: Self-pay | Admitting: Orthopedic Surgery

## 2022-12-08 ENCOUNTER — Other Ambulatory Visit (INDEPENDENT_AMBULATORY_CARE_PROVIDER_SITE_OTHER): Payer: Self-pay

## 2022-12-08 ENCOUNTER — Ambulatory Visit: Payer: Medicare Other | Admitting: Orthopedic Surgery

## 2022-12-08 DIAGNOSIS — M7742 Metatarsalgia, left foot: Secondary | ICD-10-CM | POA: Diagnosis not present

## 2022-12-08 DIAGNOSIS — M79672 Pain in left foot: Secondary | ICD-10-CM

## 2022-12-08 DIAGNOSIS — M6702 Short Achilles tendon (acquired), left ankle: Secondary | ICD-10-CM

## 2022-12-08 NOTE — Progress Notes (Addendum)
Office Visit Note   Patient: Terri Ayala           Date of Birth: 1955/08/18           MRN: [id number] Visit Date: 12/08/2022              Requested by: Melida Quitter, PA [address] [place],  [place] [address] PCP: Melida Quitter, PA  Chief Complaint  Patient presents with   Left Foot - Pain      HPI: Patient is a 67 year old gentleman is seen for initial evaluation for left forefoot pain.  Patient had metatarsal fractures several years ago and has been having increased pain beneath the fourth metatarsal head.  Assessment & Plan: Visit Diagnoses:  1. Pain in left foot   2. Metatarsalgia of left foot   3. Achilles tendon contracture, left     Plan: Recommended a stiff soled walking shoe with Achilles stretching which was demonstrated.  Discussed that if conservative treatment does not work a surgical treatment option would be a Weil osteotomy of the fourth metatarsal.  Follow-Up Instructions: No follow-ups on file.   Ortho Exam  Patient is alert, oriented, no adenopathy, well-dressed, normal affect, normal respiratory effort. Examination patient has palpable pulses.  He has fractures through the second and third metatarsals causing shortening of the second and third metatarsals with essentially a long fourth metatarsal at this time.  Patient has pain to palpation beneath the fourth metatarsal head.  With the knee extended patient has dorsiflexion only to neutral.  There are no open ulcers.  Review of the radiographs shows shortening of the second and third metatarsals from the metatarsal neck fractures  Imaging: XR Foot Complete Left  Result Date: 12/08/2022 Three-view radiographs of the left foot shows a well-healed second and third metatarsal neck fracture with shortening of the second and third metatarsal with a prominent fourth metatarsal head.  No images are attached to the encounter.  Labs: Lab Results  Component Value Date   HGBA1C 4.7 (L)  11/21/2020   HGBA1C 4.7 (L) 01/10/2020   HGBA1C 4.7 (L) 07/09/2018   ESRSEDRATE 2 05/23/2022   REPTSTATUS 10/01/2020 FINAL 09/26/2020   CULT  09/26/2020    NO GROWTH 5 DAYS Performed at [facility], [address] [place], [place] [address]      Lab Results  Component Value Date   ALBUMIN 4.6 05/23/2022   ALBUMIN 4.1 04/22/2022   ALBUMIN 4.5 02/25/2022   PREALBUMIN 19.0 03/25/2013   PREALBUMIN 12.1 (L) 03/19/2013    Lab Results  Component Value Date   MG 2.0 02/24/2022   MG 2.0 02/23/2022   MG 1.7 02/22/2022   Lab Results  Component Value Date   VD25OH 42.20 02/12/2021    Lab Results  Component Value Date   PREALBUMIN 19.0 03/25/2013   PREALBUMIN 12.1 (L) 03/19/2013      Latest Ref Rng & Units 05/23/2022    9:43 AM 02/25/2022    8:39 PM 02/23/2022    8:25 AM  CBC EXTENDED  WBC 3.4 - 10.8 x10E3/uL 7.7  8.0  5.2   RBC 3.77 - 5.28 x10E6/uL 4.19  5.28  4.32   Hemoglobin 11.1 - 15.9 g/dL 82.9  56.2  13.0   HCT 34.0 - 46.6 % 38.9  47.3  40.8   Platelets 150 - 450 x10E3/uL 234  298  224   NEUT# 1.4 - 7.0 x10E3/uL 5.1  4.3    Lymph#  0.7 - 3.1 x10E3/uL 1.7  2.8       There is no height or weight on file to calculate BMI.  Orders:  Orders Placed This Encounter  Procedures   XR Foot Complete Left   No orders of the defined types were placed in this encounter.    Procedures: No procedures performed  Clinical Data: No additional findings.  ROS:  All other systems negative, except as noted in the HPI. Review of Systems  Objective: Vital Signs: There were no vitals taken for this visit.  Specialty Comments:  No specialty comments available.  PMFS History: Patient Active Problem List   Diagnosis Date Noted   Mild cognitive impairment 10/17/2022   Balance problem 05/23/2022   Abnormal movement 05/23/2022   Weakness of both hands 05/23/2022   Rosacea 05/17/2021   Seborrheic keratosis 05/17/2021   Melanocytic nevi of trunk 05/17/2021    Chronic pain of left ankle 05/12/2021   Body mass index 26.0-26.9, adult 05/12/2021   Thyroid nodule 02/10/2021   Prolonged QT interval 02/10/2021   Elevated cholesterol 10/19/2020   Hemorrhage of rectum and anus 03/25/2020   Gastroparesis 03/25/2020   Gastroesophageal reflux disease without esophagitis 03/25/2020   Elevated LFTs 07/12/2018   Leukocytosis 12/08/2017   Benign essential HTN 12/08/2017   Osteopenia 08/27/2016   Major depressive disorder, recurrent episode, moderate (HCC) 01/27/2016   Alcohol dependence with withdrawal (HCC) 01/27/2016   Uncomplicated alcohol dependence (HCC) 04/03/2014   Lumbosacral radiculopathy at L5 05/20/2013   Nonspecific elevation of levels of transaminase or lactic acid dehydrogenase (LDH) 03/16/2013   Hepatic steatosis 03/16/2013   Chronic cholecystitis 03/14/2013   Cervical arthritis 10/12/2012   Palpitations 11/29/2010   Supraventricular tachycardia (HCC) 11/29/2010   Mitral valve prolapse 11/29/2010   Decreased libido 11/15/2010   Multinodular goiter 08/17/2009   Migraine 08/17/2009   Hearing loss 08/17/2009   Osteoporosis 08/17/2009   Generalized anxiety disorder 08/17/2009   Eosinophilic esophagitis 06/19/2007   Past Medical History:  Diagnosis Date   Alcohol withdrawal (HCC) 11/2017; 01/04/2018   Alcoholism (HCC)    Anxiety    Arthritis    "hands" (01/04/2018)   ASYMPTOMATIC POSTMENOPAUSAL STATUS 08/17/2009   Chronic lower back pain    Depression    DYSPHAGIA 06/19/2007   Fatty liver, alcoholic    GERD (gastroesophageal reflux disease)    GOITER, MULTINODULAR 08/17/2009   History of colonic polyps 03/25/2020   Hyperlipidemia    Hypertension    Migraines    "not sure what triggers them; I'll have 1 q couple weeks or more; come 2 days in a row when they come" (01/04/2018)   Mitral valve prolapse    OSTEOPOROSIS 08/17/2009   PONV (postoperative nausea and vomiting)    Supraventricular tachycardia (HCC)     Family History   Problem Relation Age of Onset   Colon cancer Father        Deceased, 39   Osteoporosis Mother        Living, 15   Healthy Brother    Healthy Brother    Healthy Son    Healthy Son    Thyroid disease Neg Hx    Goiter Neg Hx     Past Surgical History:  Procedure Laterality Date   BREAST BIOPSY Right    "benign"   CATARACT EXTRACTION W/ INTRAOCULAR LENS  IMPLANT, BILATERAL     CERVICAL CONE BIOPSY  2000s   CERVIX LESION DESTRUCTION  1991   "dysplasia; lesions"   CHOLECYSTECTOMY N/A 03/14/2013  Procedure: LAPAROSCOPIC CHOLECYSTECTOMY WITH ATTEMPTED INTRAOPERATIVE CHOLANGIOGRAM;  Surgeon: Shelly Rubenstein, MD;  Location: [facility] OR;  Service: General;  Laterality: N/A;   HARDWARE REMOVAL Left 07/22/2021   Procedure: HARDWARE REMOVAL;  Surgeon: Myrene Galas, MD;  Location: [facility] OR;  Service: Orthopedics;  Laterality: Left;   laporoscopic abdominal surgery     "for endometriosis"   LEFT HEART CATH AND CORONARY ANGIOGRAPHY N/A 05/07/2018   Procedure: LEFT HEART CATH AND CORONARY ANGIOGRAPHY;  Surgeon: Lyn Records, MD;  Location: [facility] INVASIVE [facility];  Service: Cardiovascular;  Laterality: N/A;   ORIF ANKLE FRACTURE Left 02/11/2021   Procedure: OPEN REDUCTION INTERNAL FIXATION (ORIF) ANKLE FRACTURE;  Surgeon: Myrene Galas, MD;  Location: [facility] OR;  Service: Orthopedics;  Laterality: Left;   TONSILLECTOMY     Social History   Occupational History   Occupation: Hydrographic surveyor - retired    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[employer]  Tobacco Use   Smoking status: Former    Current packs/day: 0.00    Average packs/day: 1 pack/day for 7.0 years (7.0 ttl pk-yrs)    Types: Cigarettes    Start date: 03/01/1967    Quit date: 02/28/1974    Years since quitting: 48.8    Passive exposure: Past   Smokeless tobacco: Never   Tobacco comments:    01/04/2018 "smoked when I was a teenager"  Vaping Use   Vaping status: Never Used  Substance and Sexual Activity   Alcohol use: Not Currently    Alcohol/week: 28.0  standard drinks of alcohol    Types: 28 Glasses of wine per week    Comment: I had Detox on 07/13/21 and no alcohol since then   Drug use: Never   Sexual activity: Not on file

## 2022-12-15 ENCOUNTER — Other Ambulatory Visit: Payer: Self-pay | Admitting: Family Medicine

## 2022-12-15 ENCOUNTER — Ambulatory Visit: Payer: Federal, State, Local not specified - PPO | Admitting: Podiatry

## 2022-12-15 DIAGNOSIS — I10 Essential (primary) hypertension: Secondary | ICD-10-CM

## 2022-12-15 DIAGNOSIS — F1020 Alcohol dependence, uncomplicated: Secondary | ICD-10-CM

## 2022-12-28 ENCOUNTER — Encounter: Payer: Self-pay | Admitting: Family Medicine

## 2022-12-28 ENCOUNTER — Ambulatory Visit (INDEPENDENT_AMBULATORY_CARE_PROVIDER_SITE_OTHER): Payer: Medicare Other | Admitting: Family Medicine

## 2022-12-28 VITALS — BP 127/74 | HR 92 | Resp 18 | Ht 62.0 in | Wt 120.0 lb

## 2022-12-28 DIAGNOSIS — G621 Alcoholic polyneuropathy: Secondary | ICD-10-CM | POA: Diagnosis not present

## 2022-12-28 DIAGNOSIS — I10 Essential (primary) hypertension: Secondary | ICD-10-CM

## 2022-12-28 DIAGNOSIS — F1020 Alcohol dependence, uncomplicated: Secondary | ICD-10-CM

## 2022-12-28 DIAGNOSIS — F411 Generalized anxiety disorder: Secondary | ICD-10-CM

## 2022-12-28 DIAGNOSIS — G43009 Migraine without aura, not intractable, without status migrainosus: Secondary | ICD-10-CM | POA: Diagnosis not present

## 2022-12-28 DIAGNOSIS — T161XXA Foreign body in right ear, initial encounter: Secondary | ICD-10-CM

## 2022-12-28 DIAGNOSIS — M25561 Pain in right knee: Secondary | ICD-10-CM

## 2022-12-28 DIAGNOSIS — M25551 Pain in right hip: Secondary | ICD-10-CM

## 2022-12-28 DIAGNOSIS — F331 Major depressive disorder, recurrent, moderate: Secondary | ICD-10-CM

## 2022-12-28 DIAGNOSIS — G8929 Other chronic pain: Secondary | ICD-10-CM

## 2022-12-28 MED ORDER — FOLIC ACID 1 MG PO TABS
1.0000 mg | ORAL_TABLET | Freq: Every day | ORAL | 1 refills | Status: DC
Start: 2022-12-28 — End: 2023-04-03

## 2022-12-28 NOTE — Assessment & Plan Note (Signed)
Followed by Vesta Mixer. Continue Wellbutrin 150 mg daily, BuSpar 15 mg 3 times daily, trazodone 50 mg nightly.  Will continue to monitor in coordination with psychiatry.

## 2022-12-28 NOTE — Progress Notes (Signed)
Established Patient Office Visit  Subjective   Patient ID: Terri Ayala, female    DOB: 05/15/1955  Age: 67 y.o. MRN: [id number]  Chief Complaint  Patient presents with   Anxiety   Depression   Hypertension   Migraine   Neurologic Problem        Hip Pain    right   Knee Pain    right   Ear Fullness    right    HPI Terri Ayala is a 67 y.o. female presenting today for follow up of hypertension, alcoholic neuropathy/balance problem, migraine, mood.  She also has complaints of right hip and right knee pain and bilateral ear pain R>L.  She is concerned that a piece of her hearing aid may have broken off in her right ear.  She has been cleaning her ears with Q-tips and saw blood after removing Q-tip from her left ear one time, and her right ear has been bothering her since she noticed that the dome of her right hearing aid appeared to break off.  She has not contacted her audiologist regarding this issue and has not been previously evaluated. Hypertension:  Pt denies chest pain, SOB, dizziness, edema, syncope, fatigue or heart palpitations. Taking carvedilol, reports excellent compliance with treatment. Denies side effects.  Followed by cardiology annually with last appointment on 04/22/2022. Balance/neuropathy: Doing home PT. initially complained of balance and motor skill changes on 08/23/2022.  Neurology visit on 07/04/2022 with MRI brain showed age-related changes and neurology conclusion that gait imbalance likely caused by alcoholic neuropathy. Migraine: Stable.  Taking Imitrex 100 mg as needed for acute rescue medication. Mood: Patient is here to follow up for anxiety and depression, currently managing with BuSpar, trazodone, Wellbutrin. Taking medication without side effects, reports excellent compliance with treatment.  Medication management is done by [facility]	[place].      12/28/2022   11:29 AM 10/17/2022   10:05 AM 08/23/2022    1:10 PM  Depression screen PHQ 2/9   Decreased Interest 0 1 0  Down, Depressed, Hopeless 0 0 0  PHQ - 2 Score 0 1 0  Altered sleeping 1 1 1   Tired, decreased energy 1 1 1   Change in appetite 0 0 0  Feeling bad or failure about yourself  0 0 0  Trouble concentrating 1 1 0  Moving slowly or fidgety/restless 1 1 0  Suicidal thoughts 0 0 0  PHQ-9 Score 4 5 2   Difficult doing work/chores Somewhat difficult Somewhat difficult Somewhat difficult       12/28/2022   11:29 AM 10/17/2022   10:06 AM 08/23/2022    1:10 PM 12/07/2021    1:29 PM  GAD 7 : Generalized Anxiety Score  Nervous, Anxious, on Edge 1 1 0 0  Control/stop worrying 0 1 0 0  Worry too much - different things 1 1 1  0  Trouble relaxing 0 1 0 0  Restless 0 0 0 0  Easily annoyed or irritable 0 0 0 0  Afraid - awful might happen 0 0 0 0  Total GAD 7 Score 2 4 1  0  Anxiety Difficulty Somewhat difficult  Not difficult at all Not difficult at all    Outpatient Medications Prior to Visit  Medication Sig   albuterol (VENTOLIN HFA) 108 (90 Base) MCG/ACT inhaler INHALE 2 PUFFS INTO THE LUNGS EVERY 6 HOURS AS NEEDED FOR WHEEZING OR SHORTNESS OF BREATH.   alendronate (FOSAMAX) 70 MG tablet Take 70 mg by mouth every  Monday.   baclofen (LIORESAL) 10 MG tablet Take 1 tablet (10 mg total) by mouth 3 (three) times daily.   buPROPion (WELLBUTRIN XL) 150 MG 24 hr tablet Take 1 tablet (150 mg total) by mouth every morning.   busPIRone (BUSPAR) 15 MG tablet Take 1 tablet (15 mg total) by mouth 3 (three) times daily.   Calcium Carbonate-Vitamin D (CALCIUM 600+D PO) Take 1 tablet by mouth daily.   carvedilol (COREG) 6.25 MG tablet TAKE 1 TABLET BY MOUTH 2 TIMES DAILY WITH A MEAL.   DULoxetine (CYMBALTA) 60 MG capsule Take 60 mg by mouth 2 (two) times daily.   fluticasone (FLONASE) 50 MCG/ACT nasal spray Place 2 sprays into both nostrils daily as needed for allergies.   fluticasone (FLOVENT HFA) 110 MCG/ACT inhaler 2 puff(s) Puff and swallow/ not for asthma, for EOE 2 times a day  for 30 day(s)   gabapentin (NEURONTIN) 300 MG capsule TAKE 1 CAPSULE BY MOUTH 2 TIMES DAILY.   meloxicam (MOBIC) 15 MG tablet Take 1 tablet (15 mg total) by mouth daily as needed for pain.   Multiple Vitamins-Minerals (ADULT GUMMY PO) Take 2 capsules by mouth daily.   norethindrone-ethinyl estradiol (FEMHRT 1/5) 1-5 MG-MCG TABS tablet Take 1 tablet by mouth daily.   pantoprazole (PROTONIX) 40 MG tablet TAKE 1 TABLET BY MOUTH 2 TIMES DAILY.   SUMAtriptan (IMITREX) 100 MG tablet TAKE 1 TABLET (100 MG TOTAL) BY MOUTH AS NEEDED FOR MIGRAINE. MAY REPEAT IN 2 HOURS IF HEADACHE PERSISTS OR RECURS.   traZODone (DESYREL) 50 MG tablet Take 1 tablet (50 mg total) by mouth at bedtime.   tretinoin (RETIN-A) 0.025 % cream 1 application in the evening to face Externally Once a day, PM pea size amount for 30 days   [DISCONTINUED] folic acid (FOLVITE) 1 MG tablet TAKE 1 TABLET BY MOUTH DAILY.   No facility-administered medications prior to visit.    ROS Negative unless otherwise noted in HPI   Objective:     BP 127/74 (BP Location: Right Arm, Patient Position: Sitting, Cuff Size: Normal)   Pulse 92   Resp 18   Ht 5\' 2"  (1.575 m)   Wt 120 lb (54.4 kg)   SpO2 100%   BMI 21.95 kg/m   Physical Exam Constitutional:      General: She is not in acute distress.    Appearance: Normal appearance.  HENT:     Head: Normocephalic and atraumatic.     Right Ear: External ear normal. There is no impacted cerumen. A foreign body (Clear plastic object, patient describes as "dome of hearing aid") is present.     Left Ear: Tympanic membrane, ear canal and external ear normal. There is no impacted cerumen (Dried blood on anterior external canal).  Cardiovascular:     Rate and Rhythm: Normal rate and regular rhythm.     Heart sounds: No murmur heard.    No friction rub. No gallop.  Pulmonary:     Effort: Pulmonary effort is normal. No respiratory distress.     Breath sounds: No wheezing, rhonchi or rales.   Skin:    General: Skin is warm and dry.  Neurological:     Mental Status: She is alert and oriented to person, place, and time.     Assessment & Plan:  Benign essential HTN Assessment & Plan: Last appointment with cardiology on 04/22/2022 with recommendation to follow-up in 1 year. The 10-year ASCVD risk score (Arnett DK, et al., 2019) is: 18.9%, BP goal <130/80.  Stable, continue carvedilol 6.25 mg twice daily.  Continue ambulatory blood pressure monitoring.  Will continue to monitor.   Alcoholic peripheral neuropathy [facility]) Assessment & Plan: Continue home PT. in the future, revisit conversation with [facility] to discuss any of her current medications that may be contributing to worsening neuropathy and balance issues.   Migraine without aura and without status migrainosus, not intractable Assessment & Plan: Stable.  Continue Imitrex 100 mg tablet as needed for acute rescue.   Generalized anxiety disorder Assessment & Plan: Followed by Vesta Mixer. Stable.  Continue Wellbutrin 150 mg daily, BuSpar 15 mg 3 times daily, trazodone 50 mg nightly.  Will continue to monitor in coordination with [facility].   Major depressive disorder, recurrent episode, moderate (HCC) Assessment & Plan: Followed by Vesta Mixer. Continue Wellbutrin 150 mg daily, BuSpar 15 mg 3 times daily, trazodone 50 mg nightly.  Will continue to monitor in coordination with psychiatry.   Right hip pain -     DG HIP UNILAT W OR W/O PELVIS 2-3 VIEWS RIGHT; Future  Chronic pain of right knee -     DG Knee 1-2 Views Right  Acute foreign body of right ear canal, initial encounter  Alcohol use disorder, severe, dependence (HCC) -     Folic Acid; Take 1 tablet (1 mg total) by mouth daily.  Dispense: 30 tablet; Refill: 1  Ordering x-rays of right hip and right knee to confirm that pain is arthritic in nature. Primary care office does not have necessary equipment for ear foreign body removal, advised to go to urgent care for  removal.  Patient states that she is planning to be in that area tomorrow for her audiology appointment, advised her to contact her audiologist to see if the components of the hearing aid that broke off in the ear canal require immediate removal today, or if she can delay until tomorrow.  Patient verbalized understanding and is agreeable to this plan.  Return in about 4 months (around 04/28/2023) for follow-up for HTN, neuropathy, migraine.   I spent 40 minutes on the day of the encounter to include pre-visit record review, face-to-face time with the patient obtaining history and performing physical exam, and post visit ordering of tests and medication refills.  Melida Quitter, PA

## 2022-12-28 NOTE — Assessment & Plan Note (Signed)
Stable.  Continue Imitrex 100 mg tablet as needed for acute rescue.

## 2022-12-28 NOTE — Assessment & Plan Note (Signed)
Followed by Vesta Mixer. Stable.  Continue Wellbutrin 150 mg daily, BuSpar 15 mg 3 times daily, trazodone 50 mg nightly.  Will continue to monitor in coordination with [facility].

## 2022-12-28 NOTE — Patient Instructions (Addendum)
Call your audiologist immediately and let them know that a piece of your hearing aid has broken off in your ear.  Ask if you need to go to urgent care today to have this removed, or if you can go tomorrow.  I would lean towards having it done today to risk the plastic moving further into your ear and damaging your eardrum personally.  I have sent the orders for your hip and knee x-rays to [facility] imaging on [address].  They take walk-in appointments, so whenever is convenient for you, please go to their office.

## 2022-12-28 NOTE — Assessment & Plan Note (Addendum)
Continue home PT. in the future, revisit conversation with Vesta Mixer to discuss any of her current medications that may be contributing to worsening neuropathy and balance issues.

## 2022-12-28 NOTE — Assessment & Plan Note (Signed)
Last appointment with cardiology on 04/22/2022 with recommendation to follow-up in 1 year. The 10-year ASCVD risk score (Arnett DK, et al., 2019) is: 18.9%, BP goal <130/80. Stable, continue carvedilol 6.25 mg twice daily.  Continue ambulatory blood pressure monitoring.  Will continue to monitor.

## 2022-12-29 ENCOUNTER — Ambulatory Visit
Admission: EM | Admit: 2022-12-29 | Discharge: 2022-12-29 | Disposition: A | Discharge status: Home / Self Care | Payer: Medicare Other

## 2022-12-29 ENCOUNTER — Encounter (INDEPENDENT_AMBULATORY_CARE_PROVIDER_SITE_OTHER): Payer: Self-pay

## 2022-12-29 ENCOUNTER — Ambulatory Visit (INDEPENDENT_AMBULATORY_CARE_PROVIDER_SITE_OTHER): Payer: Medicare Other

## 2022-12-29 ENCOUNTER — Telehealth: Payer: Self-pay

## 2022-12-29 VITALS — Ht 62.0 in | Wt 120.0 lb

## 2022-12-29 DIAGNOSIS — H9201 Otalgia, right ear: Secondary | ICD-10-CM | POA: Diagnosis not present

## 2022-12-29 DIAGNOSIS — T161XXA Foreign body in right ear, initial encounter: Secondary | ICD-10-CM | POA: Diagnosis not present

## 2022-12-29 DIAGNOSIS — H903 Sensorineural hearing loss, bilateral: Secondary | ICD-10-CM

## 2022-12-29 NOTE — Discharge Instructions (Signed)
Please follow up with ENT as soon as possible.

## 2022-12-29 NOTE — ED Triage Notes (Signed)
"  My hearing aide is stuck in right ear".

## 2022-12-29 NOTE — Telephone Encounter (Signed)
In office Urgent Referral. Submitted and appt provided same day. Information provided to patient.

## 2023-01-01 DIAGNOSIS — H9201 Otalgia, right ear: Secondary | ICD-10-CM | POA: Insufficient documentation

## 2023-01-01 DIAGNOSIS — H903 Sensorineural hearing loss, bilateral: Secondary | ICD-10-CM | POA: Insufficient documentation

## 2023-01-01 DIAGNOSIS — T161XXA Foreign body in right ear, initial encounter: Secondary | ICD-10-CM | POA: Insufficient documentation

## 2023-01-01 NOTE — Progress Notes (Signed)
Patient ID: Terri Ayala, female   DOB: 07/07/1955, 67 y.o.   MRN: [id number]  CC: Right ear pain, hearing loss  HPI:  Terri Ayala is a 67 y.o. female who presents today complaining of right ear pain for the past week.  She also has a history of hearing loss.  She wears bilateral hearing aids for many years.  Currently she denies any otorrhea or change in her hearing.  She underwent adenotonsillectomy surgery as a child.  She has no previous otologic surgery.  Past Medical History:  Diagnosis Date   Alcohol withdrawal (HCC) 11/2017; 01/04/2018   Alcoholism (HCC)    Anxiety    Arthritis    "hands" (01/04/2018)   ASYMPTOMATIC POSTMENOPAUSAL STATUS 08/17/2009   Chronic lower back pain    Depression    DYSPHAGIA 06/19/2007   Fatty liver, alcoholic    GERD (gastroesophageal reflux disease)    GOITER, MULTINODULAR 08/17/2009   History of colonic polyps 03/25/2020   Hyperlipidemia    Hypertension    Migraines    "not sure what triggers them; I'll have 1 q couple weeks or more; come 2 days in a row when they come" (01/04/2018)   Mitral valve prolapse    OSTEOPOROSIS 08/17/2009   PONV (postoperative nausea and vomiting)    Supraventricular tachycardia (HCC)     Past Surgical History:  Procedure Laterality Date   BREAST BIOPSY Right    "benign"   CATARACT EXTRACTION W/ INTRAOCULAR LENS  IMPLANT, BILATERAL     CERVICAL CONE BIOPSY  2000s   CERVIX LESION DESTRUCTION  1991   "dysplasia; lesions"   CHOLECYSTECTOMY N/A 03/14/2013   Procedure: LAPAROSCOPIC CHOLECYSTECTOMY WITH ATTEMPTED INTRAOPERATIVE CHOLANGIOGRAM;  Surgeon: Shelly Rubenstein, MD;  Location: [facility] OR;  Service: General;  Laterality: N/A;   HARDWARE REMOVAL Left 07/22/2021   Procedure: HARDWARE REMOVAL;  Surgeon: Myrene Galas, MD;  Location: [facility] OR;  Service: Orthopedics;  Laterality: Left;   laporoscopic abdominal surgery     "for endometriosis"   LEFT HEART CATH AND CORONARY ANGIOGRAPHY N/A 05/07/2018   Procedure: LEFT  HEART CATH AND CORONARY ANGIOGRAPHY;  Surgeon: Lyn Records, MD;  Location: [facility];  Service: Cardiovascular;  Laterality: N/A;   ORIF ANKLE FRACTURE Left 02/11/2021   Procedure: OPEN REDUCTION INTERNAL FIXATION (ORIF) ANKLE FRACTURE;  Surgeon: Myrene Galas, MD;  Location: [facility] OR;  Service: Orthopedics;  Laterality: Left;   TONSILLECTOMY      Family History  Problem Relation Age of Onset   Colon cancer Father        Deceased, 65   Osteoporosis Mother        Living, 26   Healthy Brother    Healthy Brother    Healthy Son    Healthy Son    Thyroid disease Neg Hx    Goiter Neg Hx     Social History:  reports that she quit smoking about 48 years ago. Her smoking use included cigarettes. She started smoking about 55 years ago. She has a 7 pack-year smoking history. She has been exposed to tobacco smoke. She has never used smokeless tobacco. She reports that she does not currently use alcohol after a past usage of about 28.0 standard drinks of alcohol per week. She reports that she does not use drugs.  Allergies:  Allergies  Allergen Reactions   Doxycycline Swelling    Mouth swelling and sores  Other Reaction(s): Not available   Codeine Itching    Other Reaction(s): Not  available   Tetracycline Hcl Swelling    Mouth swelling and sores    Prior to Admission medications   Medication Sig Start Date End Date Taking? Authorizing Provider  albuterol (VENTOLIN HFA) 108 (90 Base) MCG/ACT inhaler INHALE 2 PUFFS INTO THE LUNGS EVERY 6 HOURS AS NEEDED FOR WHEEZING OR SHORTNESS OF BREATH. 11/03/22  Yes Saralyn Pilar A, PA  alendronate (FOSAMAX) 70 MG tablet Take 70 mg by mouth every Monday.   Yes [provider]  baclofen (LIORESAL) 10 MG tablet Take 1 tablet (10 mg total) by mouth 3 (three) times daily. 11/17/22 02/15/23 Yes Court Joy, PA-C  buPROPion (WELLBUTRIN XL) 150 MG 24 hr tablet Take 1 tablet (150 mg total) by mouth every morning. 11/23/22 03/23/23 Yes Court Joy, PA-C  busPIRone (BUSPAR) 15 MG tablet Take 1 tablet (15 mg total) by mouth 3 (three) times daily. 07/14/22  Yes Court Joy, PA-C  Calcium Carbonate-Vitamin D (CALCIUM 600+D PO) Take 1 tablet by mouth daily.   Yes [provider]  carvedilol (COREG) 6.25 MG tablet TAKE 1 TABLET BY MOUTH 2 TIMES DAILY WITH A MEAL. 12/15/22  Yes Saralyn Pilar A, PA  DULoxetine (CYMBALTA) 60 MG capsule Take 60 mg by mouth 2 (two) times daily. 12/19/22  Yes [provider]  fluticasone (FLONASE) 50 MCG/ACT nasal spray Place 2 sprays into both nostrils daily as needed for allergies.   Yes [provider]  fluticasone (FLOVENT HFA) 110 MCG/ACT inhaler 2 puff(s) Puff and swallow/ not for asthma, for EOE 2 times a day for 30 day(s) 11/03/21  Yes [provider]  folic acid (FOLVITE) 1 MG tablet Take 1 tablet (1 mg total) by mouth daily. 12/28/22  Yes Saralyn Pilar A, PA  gabapentin (NEURONTIN) 300 MG capsule TAKE 1 CAPSULE BY MOUTH 2 TIMES DAILY. 12/15/22  Yes Saralyn Pilar A, PA  meloxicam (MOBIC) 15 MG tablet Take 1 tablet (15 mg total) by mouth daily as needed for pain. 08/03/21  Yes Abonza, Maritza, PA-C  Multiple Vitamins-Minerals (ADULT GUMMY PO) Take 2 capsules by mouth daily.   Yes [provider]  norethindrone-ethinyl estradiol (FEMHRT 1/5) 1-5 MG-MCG TABS tablet Take 1 tablet by mouth daily.   Yes [provider]  pantoprazole (PROTONIX) 40 MG tablet TAKE 1 TABLET BY MOUTH 2 TIMES DAILY. 11/16/22  Yes Saralyn Pilar A, PA  SUMAtriptan (IMITREX) 100 MG tablet TAKE 1 TABLET (100 MG TOTAL) BY MOUTH AS NEEDED FOR MIGRAINE. MAY REPEAT IN 2 HOURS IF HEADACHE PERSISTS OR RECURS. 09/21/22  Yes Saralyn Pilar A, PA  traZODone (DESYREL) 50 MG tablet Take 1 tablet (50 mg total) by mouth at bedtime. 11/17/22 02/15/23 Yes Court Joy, PA-C  tretinoin (RETIN-A) 0.025 % cream 1 application in the evening to face Externally Once a day, PM pea size amount  for 30 days 12/06/21  Yes [provider]   Height 5\' 2"  (1.575 m), weight 120 lb (54.4 kg). Exam: General: Communicates without difficulty, well nourished, no acute distress. Head: Normocephalic, no evidence injury, no tenderness, facial buttresses intact without stepoff. Face/sinus: No tenderness to palpation and percussion. Facial movement is normal and symmetric. Eyes: PERRL, EOMI. No scleral icterus, conjunctivae clear. Neuro: CN II exam reveals vision grossly intact.  No nystagmus at any point of gaze. Ears: Auricles well formed without lesions.  A right ear foreign body is noted.  The left ear canal and tympanic membrane are normal.  Nose: External evaluation reveals normal support and skin  without lesions.  Dorsum is intact.  Anterior rhinoscopy reveals congested mucosa over anterior aspect of inferior turbinates and intact septum.  No purulence noted. Oral:  Oral cavity and oropharynx are intact, symmetric, without erythema or edema.  Mucosa is moist without lesions. Neck: Full range of motion without pain.  There is no significant lymphadenopathy.  No masses palpable.  Thyroid bed within normal limits to palpation.  Parotid glands and submandibular glands equal bilaterally without mass.  Trachea is midline. Neuro:  CN 2-12 grossly intact.    Procedure: Right ear foreign body removal.   Anesthesia: None Description of the procedure: The patient is placed on a supine position.  Under the operating microscope, the right ear canal is examined.  A foreign body is noted to be impacted on the medial aspect of the ear canal.  Under the microscope, the foreign body is carefully removed with an alligator forceps and a 90 hook.  After foreign body removal, the tympanic membrane and middle ear space are noted to be normal.  The patient tolerated the procedure well.   Assessment: 1.  Right ear canal foreign body.  After the foreign body removal, her ear canal and tympanic membrane are noted to be  normal. 2.  Right ear pain is likely a result of the right ear foreign body.  No acute infection is noted today. 3.  History of bilateral sensorineural hearing loss.  The patient wears bilateral hearing aids.  Plan: 1.  Otomicroscopy with right ear canal foreign body removal. 2.  The physical exam findings are reviewed with the patient.  The patient is reassured that no infection is noted today. 3.  Continue the use of her hearing aids. 4.  The patient is encouraged to call with any questions or concerns.  Suleyman Ehrman W Chatara Lucente 01/01/2023, 9:02 AM

## 2023-01-02 ENCOUNTER — Ambulatory Visit
Admission: RE | Admit: 2023-01-02 | Discharge: 2023-01-02 | Disposition: A | Discharge status: Home / Self Care | Payer: Medicare Other | Source: Ambulatory Visit | Attending: Family Medicine

## 2023-01-02 DIAGNOSIS — M25551 Pain in right hip: Secondary | ICD-10-CM

## 2023-01-08 ENCOUNTER — Encounter: Payer: Self-pay | Admitting: Family Medicine

## 2023-01-12 ENCOUNTER — Ambulatory Visit (INDEPENDENT_AMBULATORY_CARE_PROVIDER_SITE_OTHER): Payer: Medicare Other | Admitting: Podiatry

## 2023-01-12 ENCOUNTER — Encounter: Payer: Self-pay | Admitting: Podiatry

## 2023-01-12 ENCOUNTER — Other Ambulatory Visit: Payer: Self-pay | Admitting: Podiatry

## 2023-01-12 ENCOUNTER — Ambulatory Visit (INDEPENDENT_AMBULATORY_CARE_PROVIDER_SITE_OTHER): Payer: Medicare Other

## 2023-01-12 DIAGNOSIS — G5782 Other specified mononeuropathies of left lower limb: Secondary | ICD-10-CM

## 2023-01-12 DIAGNOSIS — D2372 Other benign neoplasm of skin of left lower limb, including hip: Secondary | ICD-10-CM

## 2023-01-12 DIAGNOSIS — M7752 Other enthesopathy of left foot: Secondary | ICD-10-CM

## 2023-01-12 MED ORDER — TRIAMCINOLONE ACETONIDE 40 MG/ML IJ SUSP
20.0000 mg | Freq: Once | INTRAMUSCULAR | Status: AC
Start: 2023-01-12 — End: 2023-01-12
  Administered 2023-01-12: 20 mg

## 2023-01-12 NOTE — Progress Notes (Signed)
She presents today chief complaint of a painful fourth digit left foot states that she fractured the fourth metatarsal years ago and never had it treated Norcet.  Now she is developed pain and numbness and burning over the past few weeks that radiates out into the fourth toe she also demonstrates a distal clavus.  Objective: Vital signs stable alert oriented x 3.  She has strong palpable pulses no open lesions or wounds.  An obvious shift dorsally and laterally of the third metatarsal left foot impinging the fourth metatarsal.  Radiographs taken today do demonstrate a healed fracture of the fourth metatarsal that is dorsally dislocated and laterally dislocated.  She has pain on palpation of the neuroma with a palpable Mulder's click third interdigital space of the left foot.  Assessment: Neuroma third interspace left callus fourth digit left foot.  Hammertoe deformity.  Plan: Injected the left foot today 10 mg Kenalog 5 mg Marcaine point of maximal tenderness.  Tolerated procedure well without complications like to follow-up with her in 1 month.  Debrided the distal aspect of the nail and toe.

## 2023-01-23 ENCOUNTER — Ambulatory Visit (INDEPENDENT_AMBULATORY_CARE_PROVIDER_SITE_OTHER): Payer: Medicare Other

## 2023-01-23 DIAGNOSIS — Z0000 Encounter for general adult medical examination without abnormal findings: Secondary | ICD-10-CM | POA: Diagnosis not present

## 2023-01-23 NOTE — Patient Instructions (Signed)
Ms. Parden , Thank you for taking time to come for your Medicare Wellness Visit. I appreciate your ongoing commitment to your health goals. Please review the following plan we discussed and let me know if I can assist you in the future.   Referrals/Orders/Follow-Ups/Clinician Recommendations: none  This is a list of the screening recommended for you and due dates:  Health Maintenance  Topic Date Due   COVID-19 Vaccine (9 - 2023-24 season) 01/12/2023   Mammogram  08/17/2023   Medicare Annual Wellness Visit  01/23/2024   DTaP/Tdap/Td vaccine (3 - Td or Tdap) 09/08/2026   Colon Cancer Screening  12/25/2031   Pneumonia Vaccine  Completed   Flu Shot  Completed   DEXA scan (bone density measurement)  Completed   Hepatitis C Screening  Completed   Zoster (Shingles) Vaccine  Completed   HPV Vaccine  Aged Out    Advanced directives: (Copy Requested) Please bring a copy of your health care power of attorney and living will to the office to be added to your chart at your convenience.  Next Medicare Annual Wellness Visit scheduled for next year: No, schedule is not open for next year  Insert Preventive Care attachment Insert FALL PREVENTION attachment if needed

## 2023-01-23 NOTE — Progress Notes (Signed)
Subjective:   Terri Ayala is a 67 y.o. female who presents for Medicare Annual (Subsequent) preventive examination.  Visit Complete: Virtual I connected with  Terri Ayala on 01/23/23 by a audio enabled telemedicine application and verified that I am speaking with the correct person using two identifiers.  Patient Location: Home  Provider Location: Office/Clinic  I discussed the limitations of evaluation and management by telemedicine. The patient expressed understanding and agreed to proceed.  Vital Signs: Because this visit was a virtual/telehealth visit, some criteria may be missing or patient reported. Any vitals not documented were not able to be obtained and vitals that have been documented are patient reported.  Patient Medicare AWV questionnaire was completed by the patient on 01/22/2023; I have confirmed that all information answered by patient is correct and no changes since this date.  Cardiac Risk Factors include: advanced age (>7men, >37 women);hypertension     Objective:    Today's Vitals   There is no height or weight on file to calculate BMI.     01/23/2023    9:54 AM 07/04/2022   12:49 PM 02/25/2022    8:04 PM 02/18/2022    2:00 PM 01/25/2022    2:29 AM 07/21/2021   11:32 AM 06/09/2021    7:00 PM  Advanced Directives  Does Patient Have a Medical Advance Directive? Yes Yes No No No Yes   Type of Estate agent of [place];Living will Healthcare Power of [place];Living will;Out of facility DNR (pink MOST or yellow form)    Healthcare Power of [place];Living will Healthcare Power of [place];Living will  Does patient want to make changes to medical advance directive?      No - Patient declined   Copy of Healthcare Power of Attorney in Chart? No - copy requested     No - copy requested No - copy requested  Would patient like information on creating a medical advance directive?   No - Patient declined No - Patient declined       Current  Medications (verified) Outpatient Encounter Medications as of 01/23/2023  Medication Sig   alendronate (FOSAMAX) 70 MG tablet Take 70 mg by mouth every Monday.   baclofen (LIORESAL) 10 MG tablet Take 1 tablet (10 mg total) by mouth 3 (three) times daily.   buPROPion (WELLBUTRIN XL) 150 MG 24 hr tablet Take 1 tablet (150 mg total) by mouth every morning.   busPIRone (BUSPAR) 15 MG tablet Take 1 tablet (15 mg total) by mouth 3 (three) times daily.   Calcium Carbonate-Vitamin D (CALCIUM 600+D PO) Take 1 tablet by mouth daily.   carvedilol (COREG) 6.25 MG tablet TAKE 1 TABLET BY MOUTH 2 TIMES DAILY WITH A MEAL.   DULoxetine (CYMBALTA) 60 MG capsule Take 60 mg by mouth 2 (two) times daily.   fluticasone (FLONASE) 50 MCG/ACT nasal spray Place 2 sprays into both nostrils daily as needed for allergies.   folic acid (FOLVITE) 1 MG tablet Take 1 tablet (1 mg total) by mouth daily.   gabapentin (NEURONTIN) 300 MG capsule TAKE 1 CAPSULE BY MOUTH 2 TIMES DAILY.   meloxicam (MOBIC) 15 MG tablet Take 1 tablet (15 mg total) by mouth daily as needed for pain.   Multiple Vitamins-Minerals (ADULT GUMMY PO) Take 2 capsules by mouth daily.   norethindrone-ethinyl estradiol (FEMHRT 1/5) 1-5 MG-MCG TABS tablet Take 1 tablet by mouth daily.   pantoprazole (PROTONIX) 40 MG tablet TAKE 1 TABLET BY MOUTH 2 TIMES DAILY.   SUMAtriptan (  IMITREX) 100 MG tablet TAKE 1 TABLET (100 MG TOTAL) BY MOUTH AS NEEDED FOR MIGRAINE. MAY REPEAT IN 2 HOURS IF HEADACHE PERSISTS OR RECURS.   traZODone (DESYREL) 50 MG tablet Take 1 tablet (50 mg total) by mouth at bedtime.   tretinoin (RETIN-A) 0.025 % cream 1 application in the evening to face Externally Once a day, PM pea size amount for 30 days   albuterol (VENTOLIN HFA) 108 (90 Base) MCG/ACT inhaler INHALE 2 PUFFS INTO THE LUNGS EVERY 6 HOURS AS NEEDED FOR WHEEZING OR SHORTNESS OF BREATH. (Patient not taking: Reported on 01/23/2023)   fluticasone (FLOVENT HFA) 110 MCG/ACT inhaler 2  puff(s) Puff and swallow/ not for asthma, for EOE 2 times a day for 30 day(s) (Patient not taking: Reported on 01/23/2023)   No facility-administered encounter medications on file as of 01/23/2023.    Allergies (verified) Doxycycline, Codeine, and Tetracycline hcl   History: Past Medical History:  Diagnosis Date   Alcohol withdrawal (HCC) 11/2017; 01/04/2018   Alcoholism (HCC)    Anxiety    Arthritis    "hands" (01/04/2018)   ASYMPTOMATIC POSTMENOPAUSAL STATUS 08/17/2009   Chronic lower back pain    Depression    DYSPHAGIA 06/19/2007   Fatty liver, alcoholic    GERD (gastroesophageal reflux disease)    GOITER, MULTINODULAR 08/17/2009   History of colonic polyps 03/25/2020   Hyperlipidemia    Hypertension    Migraines    "not sure what triggers them; I'll have 1 q couple weeks or more; come 2 days in a row when they come" (01/04/2018)   Mitral valve prolapse    OSTEOPOROSIS 08/17/2009   PONV (postoperative nausea and vomiting)    Supraventricular tachycardia (HCC)    Past Surgical History:  Procedure Laterality Date   BREAST BIOPSY Right    "benign"   CATARACT EXTRACTION W/ INTRAOCULAR LENS  IMPLANT, BILATERAL     CERVICAL CONE BIOPSY  2000s   CERVIX LESION DESTRUCTION  1991   "dysplasia; lesions"   CHOLECYSTECTOMY N/A 03/14/2013   Procedure: LAPAROSCOPIC CHOLECYSTECTOMY WITH ATTEMPTED INTRAOPERATIVE CHOLANGIOGRAM;  Surgeon: Shelly Rubenstein, MD;  Location: [facility] OR;  Service: General;  Laterality: N/A;   HARDWARE REMOVAL Left 07/22/2021   Procedure: HARDWARE REMOVAL;  Surgeon: Myrene Galas, MD;  Location: [facility] OR;  Service: Orthopedics;  Laterality: Left;   laporoscopic abdominal surgery     "for endometriosis"   LEFT HEART CATH AND CORONARY ANGIOGRAPHY N/A 05/07/2018   Procedure: LEFT HEART CATH AND CORONARY ANGIOGRAPHY;  Surgeon: Lyn Records, MD;  Location: [facility] INVASIVE [facility];  Service: Cardiovascular;  Laterality: N/A;   ORIF ANKLE FRACTURE Left 02/11/2021    Procedure: OPEN REDUCTION INTERNAL FIXATION (ORIF) ANKLE FRACTURE;  Surgeon: Myrene Galas, MD;  Location: [facility] OR;  Service: Orthopedics;  Laterality: Left;   TONSILLECTOMY     Family History  Problem Relation Age of Onset   Colon cancer Father        Deceased, 86   Osteoporosis Mother        Living, 51   Healthy Brother    Healthy Brother    Healthy Son    Healthy Son    Thyroid disease Neg Hx    Goiter Neg Hx    Social History   Socioeconomic History   Marital status: Single    Spouse name: Not on file   Number of children: Not on file   Years of education: Not on file   Highest education level: Bachelor's degree (e.g., BA,  AB, BS)  Occupational History   Occupation: Hydrographic surveyor - retired    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[employer]  Tobacco Use   Smoking status: Former    Current packs/day: 0.00    Average packs/day: 1 pack/day for 7.0 years (7.0 ttl pk-yrs)    Types: Cigarettes    Start date: 03/01/1967    Quit date: 02/28/1974    Years since quitting: 48.9    Passive exposure: Past   Smokeless tobacco: Never   Tobacco comments:    01/04/2018 "smoked when I was a teenager"  Vaping Use   Vaping status: Never Used  Substance and Sexual Activity   Alcohol use: Not Currently    Alcohol/week: 28.0 standard drinks of alcohol    Types: 28 Glasses of wine per week    Comment: I had Detox on 07/13/21 and no alcohol since then   Drug use: Never   Sexual activity: Not Currently  Other Topics Concern   Not on file  Social History Narrative   Lives alone.  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      Right Handed    Lives in a one story home   Social Determinants of Health   Financial Resource Strain: Low Risk  (01/22/2023)   Overall Financial Resource Strain (CARDIA)    Difficulty of Paying Living Expenses: Not very hard  Recent Concern: Financial Resource Strain - Medium Risk (12/27/2022)   Overall Financial Resource Strain  (CARDIA)    Difficulty of Paying Living Expenses: Somewhat hard  Food Insecurity: No Food Insecurity (01/23/2023)   Hunger Vital Sign    Worried About Running Out of Food in the Last Year: Never true    Ran Out of Food in the Last Year: Never true  Transportation Needs: No Transportation Needs (01/23/2023)   PRAPARE - Administrator, Civil Service (Medical): No    Lack of Transportation (Non-Medical): No  Recent Concern: Transportation Needs - Unmet Transportation Needs (12/27/2022)   PRAPARE - Transportation    Lack of Transportation (Medical): Yes    Lack of Transportation (Non-Medical): Yes  Physical Activity: Insufficiently Active (01/22/2023)   Exercise Vital Sign    Days of Exercise per Week: 2 days    Minutes of Exercise per Session: 20 min  Stress: No Stress Concern Present (01/22/2023)   [employer] of Occupational Health - Occupational Stress Questionnaire    Feeling of Stress : Only a little  Social Connections: Socially Isolated (01/23/2023)   Social Connection and Isolation Panel [NHANES]    Frequency of Communication with Friends and Family: Once a week    Frequency of Social Gatherings with Friends and Family: Once a week    Attends Religious Services: 1 to 4 times per year    Active Member of [employer] or Organizations: No    Attends Banker Meetings: Never    Marital Status: Never married    Tobacco Counseling Counseling given: Not Answered Tobacco comments: 01/04/2018 "smoked when I was a teenager"   Clinical Intake:  Pre-visit preparation completed: Yes  Pain : No/denies pain     Nutritional Risks: None Diabetes: No  How often do you need to have someone help you when you read instructions, pamphlets, or other written materials from your doctor or pharmacy?: 1 - Never  Interpreter Needed?: No  Information entered by :: NAllen LPN   Activities of Daily Living    01/22/2023    2:25  PM 02/18/2022    2:00 PM  In your  present state of health, do you have any difficulty performing the following activities:  Hearing? 1 1  Comment wears hearing aids   Vision? 0 0  Difficulty concentrating or making decisions? 0 1  Walking or climbing stairs? 1 0  Dressing or bathing? 0 1  Doing errands, shopping? 1 1  Comment does not drive far   Preparing Food and eating ? N   Using the Toilet? N   In the past six months, have you accidently leaked urine? N   Do you have problems with loss of bowel control? N   Managing your Medications? N   Managing your Finances? N   Housekeeping or managing your Housekeeping? Y   Comment has home health     Patient Care Team: Melida Quitter, PA as PCP - General (Family Medicine) Kathleene Hazel, MD as PCP - Cardiology (Cardiology) Carrington Clamp, MD as Consulting Physician (Obstetrics and Gynecology) Charna Elizabeth, MD as Consulting Physician (Gastroenterology) Glendale Chard, DO as Consulting Physician (Neurology)  Indicate any recent Medical Services you may have received from other than [facility] providers in the past year (date may be approximate).     Assessment:   This is a routine wellness examination for Katalina.  Hearing/Vision screen Hearing Screening - Comments:: Has hearing aids that are maintained Vision Screening - Comments:: Regular eye exams,    Goals Addressed             This Visit's Progress    Patient Stated       01/23/2023, strengthen legs and hips       Depression Screen    01/23/2023    9:56 AM 12/28/2022   11:29 AM 10/17/2022   10:05 AM 08/23/2022    1:10 PM 12/07/2021    1:29 PM 09/03/2021   12:42 PM 07/30/2021   10:48 AM  PHQ 2/9 Scores  PHQ - 2 Score 0 0 1 0 0  0  PHQ- 9 Score 0 4 5 2 1  3      Information is confidential and restricted. Go to Review Flowsheets to unlock data.    Fall Risk    01/22/2023    2:25 PM 10/17/2022    9:42 AM 08/23/2022    1:10 PM 07/04/2022   12:48 PM 12/07/2021    1:29 PM  Fall Risk   Falls  in the past year? 1 1 1 1 1   Comment lost balance      Number falls in past yr: 1 1 1 1 1   Injury with Fall? 0 1 0 0 0  Risk for fall due to : History of fall(s);Medication side effect History of fall(s);Impaired balance/gait;Mental status change History of fall(s)  History of fall(s)  Follow up Falls prevention discussed;Falls evaluation completed Falls evaluation completed Falls evaluation completed Falls evaluation completed Falls evaluation completed    MEDICARE RISK AT HOME: Medicare Risk at Home Any stairs in or around the home?: Yes If so, are there any without handrails?: No Home free of loose throw rugs in walkways, pet beds, electrical cords, etc?: Yes Adequate lighting in your home to reduce risk of falls?: Yes Life alert?: Yes Use of a cane, walker or w/c?: No Grab bars in the bathroom?: Yes Shower chair or bench in shower?: Yes Elevated toilet seat or a handicapped toilet?: No  TIMED UP AND GO:  Was the test performed?  No    Cognitive Function:  01/23/2023    9:57 AM 12/07/2021    1:43 PM 01/14/2020    1:14 PM  6CIT Screen  What Year? 0 points 0 points 0 points  What month? 0 points 0 points 0 points  What time? 0 points 0 points 0 points  Count back from 20 0 points 0 points 0 points  Months in reverse 0 points 0 points 0 points  Repeat phrase 0 points 2 points 0 points  Total Score 0 points 2 points 0 points    Immunizations Immunization History  Administered Date(s) Administered   Fluad Quad(high Dose 65+) 12/07/2020   Hep A / Hep B 11/17/2022   Influenza Split 11/22/2011, 12/05/2013, 12/18/2014   Influenza, High Dose Seasonal PF 10/11/2022   Influenza,inj,Quad PF,6+ Mos 03/16/2013, 01/30/2016, 10/12/2017, 11/14/2018   Influenza-Unspecified 12/06/2014, 12/30/2015, 12/06/2021   Moderna Sars-Covid-2 Vaccination 04/05/2019, 05/03/2019, 12/20/2019, 06/05/2020, 12/19/2020   PNEUMOCOCCAL CONJUGATE-20 12/08/2020   Pfizer Covid-19 Vaccine Bivalent  Booster 70yrs & up 12/06/2021, 05/06/2022, 11/17/2022   Pneumococcal Conjugate-13 02/10/2016, 11/19/2018   Pneumococcal Polysaccharide-23 04/04/2014, 11/09/2017   Td 12/18/2006   Tdap 09/07/2016   Zoster Recombinant(Shingrix) 11/18/2016, 03/22/2017    TDAP status: Up to date  Flu Vaccine status: Up to date  Pneumococcal vaccine status: Up to date  Covid-19 vaccine status: Completed vaccines  Qualifies for Shingles Vaccine? Yes   Zostavax completed Yes   Shingrix Completed?: Yes  Screening Tests Health Maintenance  Topic Date Due   COVID-19 Vaccine (9 - 2023-24 season) 01/12/2023   MAMMOGRAM  08/17/2023   Medicare Annual Wellness (AWV)  01/23/2024   DTaP/Tdap/Td (3 - Td or Tdap) 09/08/2026   Colonoscopy  12/25/2031   Pneumonia Vaccine 94+ Years old  Completed   INFLUENZA VACCINE  Completed   DEXA SCAN  Completed   Hepatitis C Screening  Completed   Zoster Vaccines- Shingrix  Completed   HPV VACCINES  Aged Out    Health Maintenance  Health Maintenance Due  Topic Date Due   COVID-19 Vaccine (9 - 2023-24 season) 01/12/2023    Colorectal cancer screening: Type of screening: Colonoscopy. Completed 12/24/2021. Repeat every 5 years  Mammogram status: Completed 2024. Repeat every year  Bone Density status: Completed 06/03/2022.   Lung Cancer Screening: (Low Dose CT Chest recommended if Age 53-80 years, 20 pack-year currently smoking OR have quit w/in 15years.) does not qualify.   Lung Cancer Screening Referral: no  Additional Screening:  Hepatitis C Screening: does qualify; Completed 12/15/2021  Vision Screening: Recommended annual ophthalmology exams for early detection of glaucoma and other disorders of the eye. Is the patient up to date with their annual eye exam?  Yes  Who is the provider or what is the name of the office in which the patient attends annual eye exams? Looking for new doctor If pt is not established with a provider, would they like to be referred  to a provider to establish care? No .   Dental Screening: Recommended annual dental exams for proper oral hygiene  Diabetic Foot Exam: n/a  Community Resource Referral / Chronic Care Management: CRR required this visit?  No   CCM required this visit?  No     Plan:     I have personally reviewed and noted the following in the patient's chart:   Medical and social history Use of alcohol, tobacco or illicit drugs  Current medications and supplements including opioid prescriptions. Patient is not currently taking opioid prescriptions. Functional ability and status Nutritional status Physical activity Advanced  directives List of other physicians Hospitalizations, surgeries, and ER visits in previous 12 months Vitals Screenings to include cognitive, depression, and falls Referrals and appointments  In addition, I have reviewed and discussed with patient certain preventive protocols, quality metrics, and best practice recommendations. A written personalized care plan for preventive services as well as general preventive health recommendations were provided to patient.     Barb Merino, LPN   [id number]   After Visit Summary: (MyChart) Due to this being a telephonic visit, the after visit summary with patients personalized plan was offered to patient via MyChart   Nurse Notes: none

## 2023-01-25 NOTE — ED Provider Notes (Signed)
[facility]    CSN: [id number] Arrival date & time: 12/29/22  0935      History   Chief Complaint Chief Complaint  Patient presents with   Ear Problem    HPI Terri Ayala is a 67 y.o. female.   Patient here today for evaluation of possible hearing aid tip stuck in her ear.  She reports mild discomfort with sitting.  The history is provided by the patient and a relative.    Past Medical History:  Diagnosis Date   Alcohol withdrawal (HCC) 11/2017; 01/04/2018   Alcoholism (HCC)    Anxiety    Arthritis    "hands" (01/04/2018)   ASYMPTOMATIC POSTMENOPAUSAL STATUS 08/17/2009   Chronic lower back pain    Depression    DYSPHAGIA 06/19/2007   Fatty liver, alcoholic    GERD (gastroesophageal reflux disease)    GOITER, MULTINODULAR 08/17/2009   History of colonic polyps 03/25/2020   Hyperlipidemia    Hypertension    Migraines    "not sure what triggers them; I'll have 1 q couple weeks or more; come 2 days in a row when they come" (01/04/2018)   Mitral valve prolapse    OSTEOPOROSIS 08/17/2009   PONV (postoperative nausea and vomiting)    Supraventricular tachycardia (HCC)     Patient Active Problem List   Diagnosis Date Noted   Sensorineural hearing loss, bilateral 01/01/2023   Right ear pain 01/01/2023   Foreign body of right ear 01/01/2023   Alcoholic peripheral neuropathy (HCC) 12/28/2022   Mild cognitive impairment 10/17/2022   Balance problem 05/23/2022   Abnormal movement 05/23/2022   Weakness of both hands 05/23/2022   Rosacea 05/17/2021   Seborrheic keratosis 05/17/2021   Melanocytic nevi of trunk 05/17/2021   Chronic pain of left ankle 05/12/2021   Body mass index 26.0-26.9, adult 05/12/2021   Thyroid nodule 02/10/2021   Prolonged QT interval 02/10/2021   Elevated cholesterol 10/19/2020   Gastroparesis 03/25/2020   Gastroesophageal reflux disease without esophagitis 03/25/2020   Elevated LFTs 07/12/2018   Leukocytosis 12/08/2017   Benign  essential HTN 12/08/2017   Osteopenia 08/27/2016   Major depressive disorder, recurrent episode, moderate (HCC) 01/27/2016   Alcohol dependence with withdrawal (HCC) 01/27/2016   Uncomplicated alcohol dependence (HCC) 04/03/2014   Lumbosacral radiculopathy at L5 05/20/2013   Nonspecific elevation of levels of transaminase or lactic acid dehydrogenase (LDH) 03/16/2013   Hepatic steatosis 03/16/2013   Chronic cholecystitis 03/14/2013   Cervical arthritis 10/12/2012   Palpitations 11/29/2010   Supraventricular tachycardia (HCC) 11/29/2010   Mitral valve prolapse 11/29/2010   Decreased libido 11/15/2010   Multinodular goiter 08/17/2009   Migraine 08/17/2009   Hearing loss 08/17/2009   Osteoporosis 08/17/2009   Generalized anxiety disorder 08/17/2009   Eosinophilic esophagitis 06/19/2007    Past Surgical History:  Procedure Laterality Date   BREAST BIOPSY Right    "benign"   CATARACT EXTRACTION W/ INTRAOCULAR LENS  IMPLANT, BILATERAL     CERVICAL CONE BIOPSY  2000s   CERVIX LESION DESTRUCTION  1991   "dysplasia; lesions"   CHOLECYSTECTOMY N/A 03/14/2013   Procedure: LAPAROSCOPIC CHOLECYSTECTOMY WITH ATTEMPTED INTRAOPERATIVE CHOLANGIOGRAM;  Surgeon: Shelly Rubenstein, MD;  Location: [facility] OR;  Service: General;  Laterality: N/A;   HARDWARE REMOVAL Left 07/22/2021   Procedure: HARDWARE REMOVAL;  Surgeon: Myrene Galas, MD;  Location: [facility] OR;  Service: Orthopedics;  Laterality: Left;   laporoscopic abdominal surgery     "for endometriosis"   LEFT HEART CATH AND CORONARY ANGIOGRAPHY N/A  05/07/2018   Procedure: LEFT HEART CATH AND CORONARY ANGIOGRAPHY;  Surgeon: Lyn Records, MD;  Location: [facility] INVASIVE CV LAB;  Service: Cardiovascular;  Laterality: N/A;   ORIF ANKLE FRACTURE Left 02/11/2021   Procedure: OPEN REDUCTION INTERNAL FIXATION (ORIF) ANKLE FRACTURE;  Surgeon: Myrene Galas, MD;  Location: [facility] OR;  Service: Orthopedics;  Laterality: Left;   TONSILLECTOMY      OB History   No  obstetric history on file.      Home Medications    Prior to Admission medications   Medication Sig Start Date End Date Taking? Authorizing Provider  albuterol (VENTOLIN HFA) 108 (90 Base) MCG/ACT inhaler INHALE 2 PUFFS INTO THE LUNGS EVERY 6 HOURS AS NEEDED FOR WHEEZING OR SHORTNESS OF BREATH. Patient not taking: Reported on 01/23/2023 11/03/22   Saralyn Pilar A, PA  alendronate (FOSAMAX) 70 MG tablet Take 70 mg by mouth every Monday.    [provider]  baclofen (LIORESAL) 10 MG tablet Take 1 tablet (10 mg total) by mouth 3 (three) times daily. 11/17/22 02/15/23  Court Joy, PA-C  buPROPion (WELLBUTRIN XL) 150 MG 24 hr tablet Take 1 tablet (150 mg total) by mouth every morning. 11/23/22 03/23/23  Court Joy, PA-C  busPIRone (BUSPAR) 15 MG tablet Take 1 tablet (15 mg total) by mouth 3 (three) times daily. 07/14/22   Court Joy, PA-C  Calcium Carbonate-Vitamin D (CALCIUM 600+D PO) Take 1 tablet by mouth daily.    [provider]  carvedilol (COREG) 6.25 MG tablet TAKE 1 TABLET BY MOUTH 2 TIMES DAILY WITH A MEAL. 12/15/22   Melida Quitter, PA  DULoxetine (CYMBALTA) 60 MG capsule Take 60 mg by mouth 2 (two) times daily. 12/19/22   [provider]  fluticasone (FLONASE) 50 MCG/ACT nasal spray Place 2 sprays into both nostrils daily as needed for allergies.    [provider]  fluticasone (FLOVENT HFA) 110 MCG/ACT inhaler 2 puff(s) Puff and swallow/ not for asthma, for EOE 2 times a day for 30 day(s) Patient not taking: Reported on 01/23/2023 11/03/21   [provider]  folic acid (FOLVITE) 1 MG tablet Take 1 tablet (1 mg total) by mouth daily. 12/28/22   Saralyn Pilar A, PA  gabapentin (NEURONTIN) 300 MG capsule TAKE 1 CAPSULE BY MOUTH 2 TIMES DAILY. 12/15/22   Melida Quitter, PA  meloxicam (MOBIC) 15 MG tablet Take 1 tablet (15 mg total) by mouth daily as needed for pain. 08/03/21   Mayer Masker, PA-C  Multiple Vitamins-Minerals  (ADULT GUMMY PO) Take 2 capsules by mouth daily.    [provider]  norethindrone-ethinyl estradiol (FEMHRT 1/5) 1-5 MG-MCG TABS tablet Take 1 tablet by mouth daily.    [provider]  pantoprazole (PROTONIX) 40 MG tablet TAKE 1 TABLET BY MOUTH 2 TIMES DAILY. 11/16/22   Melida Quitter, PA  SUMAtriptan (IMITREX) 100 MG tablet TAKE 1 TABLET (100 MG TOTAL) BY MOUTH AS NEEDED FOR MIGRAINE. MAY REPEAT IN 2 HOURS IF HEADACHE PERSISTS OR RECURS. 09/21/22   Melida Quitter, PA  traZODone (DESYREL) 50 MG tablet Take 1 tablet (50 mg total) by mouth at bedtime. 11/17/22 02/15/23  Court Joy, PA-C  tretinoin (RETIN-A) 0.025 % cream 1 application in the evening to face Externally Once a day, PM pea size amount for 30 days 12/06/21   [provider]    Family History Family History  Problem Relation Age of Onset   Colon cancer Father  Deceased, 20   Osteoporosis Mother        Living, 71   Healthy Brother    Healthy Brother    Healthy Son    Healthy Son    Thyroid disease Neg Hx    Goiter Neg Hx     Social History Social History   Tobacco Use   Smoking status: Former    Current packs/day: 0.00    Average packs/day: 1 pack/day for 7.0 years (7.0 ttl pk-yrs)    Types: Cigarettes    Start date: 03/01/1967    Quit date: 02/28/1974    Years since quitting: 48.9    Passive exposure: Past   Smokeless tobacco: Never   Tobacco comments:    01/04/2018 "smoked when I was a teenager"  Vaping Use   Vaping status: Never Used  Substance Use Topics   Alcohol use: Not Currently    Alcohol/week: 28.0 standard drinks of alcohol    Types: 28 Glasses of wine per week    Comment: I had Detox on 07/13/21 and no alcohol since then   Drug use: Never     Allergies   Doxycycline, Codeine, and Tetracycline hcl   Review of Systems Review of Systems  Constitutional:  Negative for chills and fever.  HENT:  Positive for ear pain. Negative for congestion.   Eyes:   Negative for discharge and redness.  Respiratory:  Negative for shortness of breath.   Gastrointestinal:  Negative for abdominal pain, nausea and vomiting.     Physical Exam Triage Vital Signs ED Triage Vitals  Encounter Vitals Group     BP 12/29/22 0947 117/72     Systolic BP Percentile --      Diastolic BP Percentile --      Pulse Rate 12/29/22 0947 80     Resp 12/29/22 0947 18     Temp 12/29/22 0947 98 F (36.7 C)     Temp Source 12/29/22 0947 Oral     SpO2 12/29/22 0947 99 %     Weight 12/29/22 0946 119 lb 14.9 oz (54.4 kg)     Height 12/29/22 0946 5\' 2"  (1.575 m)     Head Circumference --      Peak Flow --      Pain Score 12/29/22 1041 0     Pain Loc --      Pain Education --      Exclude from Growth Chart --    No data found.  Updated Vital Signs BP 117/72 (BP Location: Left Arm)   Pulse 80   Temp 98 F (36.7 C) (Oral)   Resp 18   Ht 5\' 2"  (1.575 m)   Wt 119 lb 14.9 oz (54.4 kg)   SpO2 99%   BMI 21.94 kg/m   Visual Acuity Right Eye Distance:   Left Eye Distance:   Bilateral Distance:    Right Eye Near:   Left Eye Near:    Bilateral Near:     Physical Exam Vitals and nursing note reviewed.  Constitutional:      General: She is not in acute distress.    Appearance: Normal appearance. She is not ill-appearing.  HENT:     Head: Normocephalic and atraumatic.     Ears:     Comments: Foreign body noted to right EAC, attempted removal with forceps without success Eyes:     Conjunctiva/sclera: Conjunctivae normal.  Cardiovascular:     Rate and Rhythm: Normal rate.  Pulmonary:     Effort:  Pulmonary effort is normal. No respiratory distress.  Neurological:     Mental Status: She is alert.  Psychiatric:        Mood and Affect: Mood normal.        Behavior: Behavior normal.        Thought Content: Thought content normal.      UC Treatments / Results  Labs (all labs ordered are listed, but only abnormal results are displayed) Labs Reviewed - No  data to display  EKG   Radiology No results found.  Procedures Procedures (including critical care time)  Medications Ordered in UC Medications - No data to display  Initial Impression / Assessment and Plan / UC Course  I have reviewed the triage vital signs and the nursing notes.  Pertinent labs & imaging results that were available during my care of the patient were reviewed by me and considered in my medical decision making (see chart for details).    Attempted removal with forceps in office but recommended further evaluation by ENT as we were unsuccessful with same.  Patient expresses understanding.  Final Clinical Impressions(s) / UC Diagnoses   Final diagnoses:  FB ear, right, initial encounter     Discharge Instructions       Please follow up with ENT as soon as possible.    ED Prescriptions   None    PDMP not reviewed this encounter.   Tomi Bamberger, PA-C 01/25/23 [phone]

## 2023-02-06 ENCOUNTER — Emergency Department (HOSPITAL_COMMUNITY): Payer: Medicare Other

## 2023-02-06 ENCOUNTER — Other Ambulatory Visit: Payer: Self-pay

## 2023-02-06 ENCOUNTER — Ambulatory Visit (INDEPENDENT_AMBULATORY_CARE_PROVIDER_SITE_OTHER): Payer: Medicare Other

## 2023-02-06 ENCOUNTER — Encounter (HOSPITAL_COMMUNITY): Payer: Self-pay

## 2023-02-06 ENCOUNTER — Emergency Department (HOSPITAL_COMMUNITY)
Admission: EM | Admit: 2023-02-06 | Discharge: 2023-02-07 | Disposition: A | Discharge status: Home / Self Care | Payer: Medicare Other

## 2023-02-06 ENCOUNTER — Ambulatory Visit
Admission: RE | Admit: 2023-02-06 | Discharge: 2023-02-06 | Disposition: A | Discharge status: Home / Self Care | Payer: Self-pay | Source: Ambulatory Visit | Attending: Physician Assistant | Admitting: Physician Assistant

## 2023-02-06 VITALS — BP 141/76 | HR 85 | Temp 98.0°F | Resp 18

## 2023-02-06 DIAGNOSIS — W19XXXA Unspecified fall, initial encounter: Secondary | ICD-10-CM

## 2023-02-06 DIAGNOSIS — W08XXXA Fall from other furniture, initial encounter: Secondary | ICD-10-CM | POA: Diagnosis not present

## 2023-02-06 DIAGNOSIS — S300XXA Contusion of lower back and pelvis, initial encounter: Secondary | ICD-10-CM

## 2023-02-06 DIAGNOSIS — M545 Low back pain, unspecified: Secondary | ICD-10-CM

## 2023-02-06 DIAGNOSIS — S3219XA Other fracture of sacrum, initial encounter for closed fracture: Secondary | ICD-10-CM | POA: Insufficient documentation

## 2023-02-06 DIAGNOSIS — S3210XA Unspecified fracture of sacrum, initial encounter for closed fracture: Secondary | ICD-10-CM

## 2023-02-06 DIAGNOSIS — M533 Sacrococcygeal disorders, not elsewhere classified: Secondary | ICD-10-CM | POA: Diagnosis present

## 2023-02-06 MED ORDER — HYDROCODONE-ACETAMINOPHEN 5-325 MG PO TABS: 0.5000 | ORAL_TABLET | Freq: Two times a day (BID) | ORAL | 0 refills | Status: AC | PRN

## 2023-02-06 NOTE — ED Provider Notes (Signed)
[facility]    CSN: [id number] Arrival date & time: 02/06/23  1225      History   Chief Complaint Chief Complaint  Patient presents with   Back Pain    I fell off a stool and landed on my tail bone. Very painful. Would like to have a X-ray. - Entered by patient   Fall    HPI Terri Ayala is a 67 y.o. female.   Patient presents today immediately after falling.  Reports that she generally sits on a stool to give her dog medication and earlier today she lost her balance while getting up and fell onto her buttocks.  She has had significant pain since that time but was able to get up on her own.  She initially thought she might of bumped her head against something as she fell but denies any headache, dizziness, nausea, vomiting, amnesia surrounding event.  She does not take any blood thinning medication.  Upon further clarification she is unsure if she even hit her head.  She reports that the pain in her lower spine/tailbone/pelvis is severe and rated 9 on a 0-10 pain scale, described as sharp, worse with movement, no relieving factors identified.  She has been able to urinate without difficulty following the accident.  She has tried ibuprofen without improvement of pain.  She denies any numbness or paresthesias in her legs.  She is able to ambulate unassisted.    Past Medical History:  Diagnosis Date   Alcohol withdrawal (HCC) 11/2017; 01/04/2018   Alcoholism (HCC)    Anxiety    Arthritis    "hands" (01/04/2018)   ASYMPTOMATIC POSTMENOPAUSAL STATUS 08/17/2009   Chronic lower back pain    Depression    DYSPHAGIA 06/19/2007   Fatty liver, alcoholic    GERD (gastroesophageal reflux disease)    GOITER, MULTINODULAR 08/17/2009   History of colonic polyps 03/25/2020   Hyperlipidemia    Hypertension    Migraines    "not sure what triggers them; I'll have 1 q couple weeks or more; come 2 days in a row when they come" (01/04/2018)   Mitral valve prolapse    OSTEOPOROSIS  08/17/2009   PONV (postoperative nausea and vomiting)    Supraventricular tachycardia (HCC)     Patient Active Problem List   Diagnosis Date Noted   Sensorineural hearing loss, bilateral 01/01/2023   Right ear pain 01/01/2023   Foreign body of right ear 01/01/2023   Alcoholic peripheral neuropathy (HCC) 12/28/2022   Mild cognitive impairment 10/17/2022   Balance problem 05/23/2022   Abnormal movement 05/23/2022   Weakness of both hands 05/23/2022   Rosacea 05/17/2021   Seborrheic keratosis 05/17/2021   Melanocytic nevi of trunk 05/17/2021   Chronic pain of left ankle 05/12/2021   Body mass index 26.0-26.9, adult 05/12/2021   Thyroid nodule 02/10/2021   Prolonged QT interval 02/10/2021   Elevated cholesterol 10/19/2020   Gastroparesis 03/25/2020   Gastroesophageal reflux disease without esophagitis 03/25/2020   Elevated LFTs 07/12/2018   Leukocytosis 12/08/2017   Benign essential HTN 12/08/2017   Osteopenia 08/27/2016   Major depressive disorder, recurrent episode, moderate (HCC) 01/27/2016   Alcohol dependence with withdrawal (HCC) 01/27/2016   Uncomplicated alcohol dependence (HCC) 04/03/2014   Lumbosacral radiculopathy at L5 05/20/2013   Nonspecific elevation of levels of transaminase or lactic acid dehydrogenase (LDH) 03/16/2013   Hepatic steatosis 03/16/2013   Chronic cholecystitis 03/14/2013   Cervical arthritis 10/12/2012   Palpitations 11/29/2010   Supraventricular tachycardia (HCC) 11/29/2010  Mitral valve prolapse 11/29/2010   Decreased libido 11/15/2010   Multinodular goiter 08/17/2009   Migraine 08/17/2009   Hearing loss 08/17/2009   Osteoporosis 08/17/2009   Generalized anxiety disorder 08/17/2009   Eosinophilic esophagitis 06/19/2007    Past Surgical History:  Procedure Laterality Date   BREAST BIOPSY Right    "benign"   CATARACT EXTRACTION W/ INTRAOCULAR LENS  IMPLANT, BILATERAL     CERVICAL CONE BIOPSY  2000s   CERVIX LESION DESTRUCTION  1991    "dysplasia; lesions"   CHOLECYSTECTOMY N/A 03/14/2013   Procedure: LAPAROSCOPIC CHOLECYSTECTOMY WITH ATTEMPTED INTRAOPERATIVE CHOLANGIOGRAM;  Surgeon: Shelly Rubenstein, MD;  Location: [facility] OR;  Service: General;  Laterality: N/A;   HARDWARE REMOVAL Left 07/22/2021   Procedure: HARDWARE REMOVAL;  Surgeon: Myrene Galas, MD;  Location: [facility] OR;  Service: Orthopedics;  Laterality: Left;   laporoscopic abdominal surgery     "for endometriosis"   LEFT HEART CATH AND CORONARY ANGIOGRAPHY N/A 05/07/2018   Procedure: LEFT HEART CATH AND CORONARY ANGIOGRAPHY;  Surgeon: Lyn Records, MD;  Location: [facility] INVASIVE [facility];  Service: Cardiovascular;  Laterality: N/A;   ORIF ANKLE FRACTURE Left 02/11/2021   Procedure: OPEN REDUCTION INTERNAL FIXATION (ORIF) ANKLE FRACTURE;  Surgeon: Myrene Galas, MD;  Location: [facility] OR;  Service: Orthopedics;  Laterality: Left;   TONSILLECTOMY      OB History   No obstetric history on file.      Home Medications    Prior to Admission medications   Medication Sig Start Date End Date Taking? Authorizing Provider  alendronate (FOSAMAX) 70 MG tablet Take 70 mg by mouth every Monday.   Yes [provider]  baclofen (LIORESAL) 10 MG tablet Take 1 tablet (10 mg total) by mouth 3 (three) times daily. 11/17/22 02/15/23 Yes Court Joy, PA-C  buPROPion (WELLBUTRIN XL) 150 MG 24 hr tablet Take 1 tablet (150 mg total) by mouth every morning. 11/23/22 03/23/23 Yes Court Joy, PA-C  busPIRone (BUSPAR) 15 MG tablet Take 1 tablet (15 mg total) by mouth 3 (three) times daily. 07/14/22  Yes Court Joy, PA-C  Calcium Carbonate-Vitamin D (CALCIUM 600+D PO) Take 1 tablet by mouth daily.   Yes [provider]  carvedilol (COREG) 6.25 MG tablet TAKE 1 TABLET BY MOUTH 2 TIMES DAILY WITH A MEAL. 12/15/22  Yes Saralyn Pilar A, PA  DULoxetine (CYMBALTA) 60 MG capsule Take 60 mg by mouth 2 (two) times daily. 12/19/22  Yes [provider]  fluticasone  (FLONASE) 50 MCG/ACT nasal spray Place 2 sprays into both nostrils daily as needed for allergies.   Yes [provider]  folic acid (FOLVITE) 1 MG tablet Take 1 tablet (1 mg total) by mouth daily. 12/28/22  Yes Saralyn Pilar A, PA  gabapentin (NEURONTIN) 300 MG capsule TAKE 1 CAPSULE BY MOUTH 2 TIMES DAILY. 12/15/22  Yes Saralyn Pilar A, PA  HYDROcodone-acetaminophen (NORCO/VICODIN) 5-325 MG tablet Take 0.5-1 tablets by mouth 2 (two) times daily as needed for up to 3 days. 02/06/23 02/09/23 Yes Burnice Vassel K, PA-C  Multiple Vitamins-Minerals (ADULT GUMMY PO) Take 2 capsules by mouth daily.   Yes [provider]  norethindrone-ethinyl estradiol (FEMHRT 1/5) 1-5 MG-MCG TABS tablet Take 1 tablet by mouth daily.   Yes [provider]  pantoprazole (PROTONIX) 40 MG tablet TAKE 1 TABLET BY MOUTH 2 TIMES DAILY. 11/16/22  Yes Saralyn Pilar A, PA  SUMAtriptan (IMITREX) 100 MG tablet TAKE 1 TABLET (100 MG TOTAL) BY MOUTH AS NEEDED FOR  MIGRAINE. MAY REPEAT IN 2 HOURS IF HEADACHE PERSISTS OR RECURS. 09/21/22  Yes Saralyn Pilar A, PA  traZODone (DESYREL) 50 MG tablet Take 1 tablet (50 mg total) by mouth at bedtime. 11/17/22 02/15/23 Yes Court Joy, PA-C  tretinoin (RETIN-A) 0.025 % cream 1 application in the evening to face Externally Once a day, PM pea size amount for 30 days 12/06/21  Yes [provider]  fluticasone (FLOVENT HFA) 110 MCG/ACT inhaler 2 puff(s) Puff and swallow/ not for asthma, for EOE 2 times a day for 30 day(s) Patient not taking: Reported on 01/23/2023 11/03/21   [provider]  meloxicam (MOBIC) 15 MG tablet Take 1 tablet (15 mg total) by mouth daily as needed for pain. 08/03/21   Mayer Masker, PA-C    Family History Family History  Problem Relation Age of Onset   Colon cancer Father        Deceased, 61   Osteoporosis Mother        Living, 89   Healthy Brother    Healthy Brother    Healthy Son    Healthy Son    Thyroid disease  Neg Hx    Goiter Neg Hx     Social History Social History   Tobacco Use   Smoking status: Former    Current packs/day: 0.00    Average packs/day: 1 pack/day for 7.0 years (7.0 ttl pk-yrs)    Types: Cigarettes    Start date: 03/01/1967    Quit date: 02/28/1974    Years since quitting: 48.9    Passive exposure: Past   Smokeless tobacco: Never   Tobacco comments:    01/04/2018 "smoked when I was a teenager"  Vaping Use   Vaping status: Never Used  Substance Use Topics   Alcohol use: Not Currently    Alcohol/week: 28.0 standard drinks of alcohol    Types: 28 Glasses of wine per week    Comment: I had Detox on 07/13/21 and no alcohol since then   Drug use: Never     Allergies   Doxycycline, Codeine, and Tetracycline hcl   Review of Systems Review of Systems  Constitutional:  Positive for activity change. Negative for appetite change, fatigue and fever.  Eyes:  Negative for visual disturbance.  Gastrointestinal:  Negative for abdominal pain, diarrhea, nausea and vomiting.  Musculoskeletal:  Positive for arthralgias and back pain. Negative for myalgias.  Neurological:  Negative for dizziness, weakness, light-headedness, numbness and headaches.     Physical Exam Triage Vital Signs ED Triage Vitals  Encounter Vitals Group     BP 02/06/23 1248 (!) 141/76     Systolic BP Percentile --      Diastolic BP Percentile --      Pulse Rate 02/06/23 1248 85     Resp 02/06/23 1248 18     Temp 02/06/23 1248 98 F (36.7 C)     Temp Source 02/06/23 1248 Oral     SpO2 02/06/23 1248 98 %     Weight --      Height --      Head Circumference --      Peak Flow --      Pain Score 02/06/23 1246 9     Pain Loc --      Pain Education --      Exclude from Growth Chart --    No data found.  Updated Vital Signs BP (!) 141/76 (BP Location: Left Arm)   Pulse 85   Temp 98  F (36.7 C) (Oral)   Resp 18   SpO2 98%   Visual Acuity Right Eye Distance:   Left Eye Distance:   Bilateral  Distance:    Right Eye Near:   Left Eye Near:    Bilateral Near:     Physical Exam Vitals reviewed.  Constitutional:      General: She is awake. She is not in acute distress.    Appearance: Normal appearance. She is well-developed. She is not ill-appearing.     Comments: Very pleasant female appears stated age in no acute distress sitting comfortably in exam room  HENT:     Head: Normocephalic and atraumatic.  Cardiovascular:     Rate and Rhythm: Normal rate and regular rhythm.     Heart sounds: Normal heart sounds, S1 normal and S2 normal. No murmur heard. Pulmonary:     Effort: Pulmonary effort is normal.     Breath sounds: Normal breath sounds. No wheezing, rhonchi or rales.     Comments: Clear to auscultation bilaterally Musculoskeletal:     Cervical back: No tenderness or bony tenderness.     Thoracic back: No tenderness or bony tenderness.     Lumbar back: Tenderness and bony tenderness present.     Comments: Back: Significant tenderness palpation over lumbar spine to sacrum.  No deformity noted.  Strength 5/5 bilateral lower extremities.  Psychiatric:        Behavior: Behavior is cooperative.      UC Treatments / Results  Labs (all labs ordered are listed, but only abnormal results are displayed) Labs Reviewed - No data to display  EKG   Radiology No results found.  Procedures Procedures (including critical care time)  Medications Ordered in UC Medications - No data to display  Initial Impression / Assessment and Plan / UC Course  I have reviewed the triage vital signs and the nursing notes.  Pertinent labs & imaging results that were available during my care of the patient were reviewed by me and considered in my medical decision making (see chart for details).     Patient is well-appearing, afebrile, nontoxic, nontachycardic.  We did discuss that if there was concern for head injury the safest thing to do would be to go to the emergency room based on  [place] score, however, patient does not believe that she actually hit her head and has no alarming symptoms so we will monitor for any concerning signs of head injury and go to the emergency room immediately if anything changes.  X-ray of sacrum/coccyx, lumbar spine, pelvis was obtained and showed significant degeneration but no acute abnormality based on my primary read.  We were waiting for radiologist overread at the time of discharge and we will contact her if this differs and changes our treatment plan.  Given the severity of pain she was started on low-dose hydrocodone.  Recommended that she take 0.5 to 1 tablets up to twice a day we discussed that she should take them right before going to bed or resting as an increase the risk for falling.  [place] controlled substance anyway shows no inappropriate refills.  We did discuss that this is sedated and addictive so she should limit use is much as possible.  She can continue over-the-counter medications including Tylenol and ibuprofen.  She was encouraged to get a donut pillow for additional symptom relief.  Recommended that she follow-up with [facility] if her symptoms are not improving quickly.  If she has any worsening symptoms  including increasing pain, difficulty ambulating, numbness or paresthesias in her feet, bowel or bladder incontinence or retention she is to go to the emergency room immediately to which she expressed understanding.     Final Clinical Impressions(s) / UC Diagnoses   Final diagnoses:  Contusion of coccyx, initial encounter  Traumatic coccydynia  Lumbar back pain  Fall, initial encounter     Discharge Instructions      I will contact you if your x-rays show anything that changes our plan.  Please pick up a doughnut pillow to provide relief.  You can use heat for additional symptom relief.  Take over-the-counter medications including Tylenol and ibuprofen.  Follow-up with orthopedics soon as possible; call to  schedule an appointment.  I have called in pain medication (hydrocodone) given the severity of your pain.  These medications are addictive and sedating so please limit the use as much as possible.  Take them right before going to bed or resting as they increase the risk for falls.  Do not combine them with other sedating medications including your gabapentin or baclofen.  If anything worsens you have increasing pain, difficulty going to the bathroom, bowel/bladder continence, difficulty walking you need to go to the emergency room immediately as we discussed.     ED Prescriptions     Medication Sig Dispense Auth. Provider   HYDROcodone-acetaminophen (NORCO/VICODIN) 5-325 MG tablet Take 0.5-1 tablets by mouth 2 (two) times daily as needed for up to 3 days. 6 tablet Noam Karaffa K, PA-C      I have reviewed the PDMP during this encounter.   Jeani Hawking, PA-C 02/06/23 1429

## 2023-02-06 NOTE — Discharge Instructions (Signed)
I will contact you if your x-rays show anything that changes our plan.  Please pick up a doughnut pillow to provide relief.  You can use heat for additional symptom relief.  Take over-the-counter medications including Tylenol and ibuprofen.  Follow-up with orthopedics soon as possible; call to schedule an appointment.  I have called in pain medication (hydrocodone) given the severity of your pain.  These medications are addictive and sedating so please limit the use as much as possible.  Take them right before going to bed or resting as they increase the risk for falls.  Do not combine them with other sedating medications including your gabapentin or baclofen.  If anything worsens you have increasing pain, difficulty going to the bathroom, bowel/bladder continence, difficulty walking you need to go to the emergency room immediately as we discussed.

## 2023-02-06 NOTE — ED Triage Notes (Signed)
States she was standing up from a stool and lost her balance and fell backwards landing on her tailbone. States she also hit her head. Denies LOC. C/O pain in her sacrum with standing and walking. Would like an xray

## 2023-02-06 NOTE — ED Provider Triage Note (Signed)
Emergency Medicine Provider Triage Evaluation Note  Terri Ayala , a 67 y.o. female  was evaluated in triage.  Pt complains of fall.  Occurred Saturday. She fell backwards off a stool and landed on her buttock. Now with buttock pain and lower back pain. No abdominal pain. Denies saddle anesthesia, urinary or bowel changes.   Review of Systems  Positive: See above Negative: See above  Physical Exam  BP 135/89 (BP Location: Left Arm)   Pulse 96   Temp 98.2 F (36.8 C)   Resp 16   Ht 5\' 2"  (1.575 m)   Wt 54.4 kg   SpO2 100%   BMI 21.95 kg/m  Gen:   Awake, no distress   Resp:  Normal effort  MSK:   Moves extremities without difficulty  Other:    Medical Decision Making  Medically screening exam initiated at 7:50 PM.  Appropriate orders placed.  Terri Ayala was informed that the remainder of the evaluation will be completed by another provider, this initial triage assessment does not replace that evaluation, and the importance of remaining in the ED until their evaluation is complete.  Work up started   Terri Ayala, [place] 02/06/23 1952

## 2023-02-06 NOTE — ED Triage Notes (Signed)
Pt arrived from home via POV after UC visit. UC stressed that pt come to ED immediately for CT as discussed in office per the discharge papers. Pt poor historian of exactly why. Per pt something to do with a possible fracture to coccyx.

## 2023-02-07 ENCOUNTER — Emergency Department (HOSPITAL_COMMUNITY): Payer: Medicare Other

## 2023-02-07 DIAGNOSIS — S3219XA Other fracture of sacrum, initial encounter for closed fracture: Secondary | ICD-10-CM | POA: Diagnosis not present

## 2023-02-07 MED ORDER — OXYCODONE-ACETAMINOPHEN 5-325 MG PO TABS: 1.0000 | ORAL_TABLET | Freq: Four times a day (QID) | ORAL | 0 refills | Status: DC | PRN

## 2023-02-07 MED ORDER — LIDOCAINE 4 % EX PTCH: 1.0000 | MEDICATED_PATCH | CUTANEOUS | 0 refills | Status: DC

## 2023-02-07 MED ORDER — LIDOCAINE 5 % EX PTCH
1.0000 | MEDICATED_PATCH | Freq: Once | CUTANEOUS | Status: DC
  Administered 2023-02-07: 1 via TRANSDERMAL
  Filled 2023-02-07: qty 1

## 2023-02-07 MED ORDER — OXYCODONE-ACETAMINOPHEN 5-325 MG PO TABS
1.0000 | ORAL_TABLET | Freq: Once | ORAL | Status: AC
  Administered 2023-02-07: 1 via ORAL
  Filled 2023-02-07: qty 1

## 2023-02-07 NOTE — Discharge Instructions (Addendum)
You have a sacral fracture.  Please take Tylenol alternating with ibuprofen for baseline pain control.  We are also prescribing lidocaine patches.  You may take Percocet for breakthrough pain.  As discussed, you will need to follow-up with Ortho.  Please call and schedule an appointment.

## 2023-02-07 NOTE — ED Provider Notes (Signed)
[place] EMERGENCY DEPARTMENT AT [facility] Provider Note   CSN: [id number] Arrival date & time: 02/06/23  1804     History  Chief Complaint  Patient presents with   Terri Ayala is a 67 y.o. female.  This is a 67 year old female presenting emergency department by the recommendations of urgent care for concerns of possible sacral fracture.  Reports she fell on Saturday.  Larey Seat off of a small stool onto her backside.  Reports pain to her sacrum and low back.  No weakness in her lower extremities, no saddle anesthesia, no difficulty urinating.  Reports she presented today as pain has persisted.  Was given a Percocet at urgent care and reports some improvement with her pain.    Fall       Home Medications Prior to Admission medications   Medication Sig Start Date End Date Taking? Authorizing Provider  alendronate (FOSAMAX) 70 MG tablet Take 70 mg by mouth every Monday.    [provider]  baclofen (LIORESAL) 10 MG tablet Take 1 tablet (10 mg total) by mouth 3 (three) times daily. 11/17/22 02/15/23  Court Joy, PA-C  buPROPion (WELLBUTRIN XL) 150 MG 24 hr tablet Take 1 tablet (150 mg total) by mouth every morning. 11/23/22 03/23/23  Court Joy, PA-C  busPIRone (BUSPAR) 15 MG tablet Take 1 tablet (15 mg total) by mouth 3 (three) times daily. 07/14/22   Court Joy, PA-C  Calcium Carbonate-Vitamin D (CALCIUM 600+D PO) Take 1 tablet by mouth daily.    [provider]  carvedilol (COREG) 6.25 MG tablet TAKE 1 TABLET BY MOUTH 2 TIMES DAILY WITH A MEAL. 12/15/22   Melida Quitter, PA  DULoxetine (CYMBALTA) 60 MG capsule Take 60 mg by mouth 2 (two) times daily. 12/19/22   [provider]  fluticasone (FLONASE) 50 MCG/ACT nasal spray Place 2 sprays into both nostrils daily as needed for allergies.    [provider]  fluticasone (FLOVENT HFA) 110 MCG/ACT inhaler 2 puff(s) Puff and swallow/ not for asthma, for EOE 2 times  a day for 30 day(s) Patient not taking: Reported on 01/23/2023 11/03/21   [provider]  folic acid (FOLVITE) 1 MG tablet Take 1 tablet (1 mg total) by mouth daily. 12/28/22   Saralyn Pilar A, PA  gabapentin (NEURONTIN) 300 MG capsule TAKE 1 CAPSULE BY MOUTH 2 TIMES DAILY. 12/15/22   Melida Quitter, PA  HYDROcodone-acetaminophen (NORCO/VICODIN) 5-325 MG tablet Take 0.5-1 tablets by mouth 2 (two) times daily as needed for up to 3 days. 02/06/23 02/09/23  Raspet, Noberto Retort, PA-C  meloxicam (MOBIC) 15 MG tablet Take 1 tablet (15 mg total) by mouth daily as needed for pain. 08/03/21   Mayer Masker, PA-C  Multiple Vitamins-Minerals (ADULT GUMMY PO) Take 2 capsules by mouth daily.    [provider]  norethindrone-ethinyl estradiol (FEMHRT 1/5) 1-5 MG-MCG TABS tablet Take 1 tablet by mouth daily.    [provider]  pantoprazole (PROTONIX) 40 MG tablet TAKE 1 TABLET BY MOUTH 2 TIMES DAILY. 11/16/22   Melida Quitter, PA  SUMAtriptan (IMITREX) 100 MG tablet TAKE 1 TABLET (100 MG TOTAL) BY MOUTH AS NEEDED FOR MIGRAINE. MAY REPEAT IN 2 HOURS IF HEADACHE PERSISTS OR RECURS. 09/21/22   Melida Quitter, PA  traZODone (DESYREL) 50 MG tablet Take 1 tablet (50 mg total) by mouth at bedtime. 11/17/22 02/15/23  Court Joy, PA-C  tretinoin (RETIN-A) 0.025 % cream 1  application in the evening to face Externally Once a day, PM pea size amount for 30 days 12/06/21   [provider]      Allergies    Doxycycline, Codeine, and Tetracycline hcl    Review of Systems   Review of Systems  Physical Exam Updated Vital Signs BP (!) 151/85 (BP Location: Left Arm)   Pulse 93   Temp 98.5 F (36.9 C) (Oral)   Resp 18   Ht 5\' 2"  (1.575 m)   Wt 54.4 kg   SpO2 93%   BMI 21.95 kg/m  Physical Exam Vitals and nursing note reviewed.  Constitutional:      General: She is not in acute distress.    Appearance: She is not toxic-appearing.  HENT:     Head: Normocephalic.      Nose: Nose normal.     Mouth/Throat:     Mouth: Mucous membranes are moist.  Eyes:     Conjunctiva/sclera: Conjunctivae normal.  Cardiovascular:     Rate and Rhythm: Normal rate and regular rhythm.  Pulmonary:     Effort: Pulmonary effort is normal.     Breath sounds: Normal breath sounds.  Abdominal:     General: Abdomen is flat. There is no distension.     Palpations: Abdomen is soft.     Tenderness: There is no abdominal tenderness. There is no guarding or rebound.  Musculoskeletal:     Comments: 5-5 plantarflexion, dorsiflexion, hip flexion extension.  Soft compartments.  Tenderness in her low back  Skin:    General: Skin is warm and dry.     Capillary Refill: Capillary refill takes less than 2 seconds.  Neurological:     General: No focal deficit present.     Mental Status: She is alert.  Psychiatric:        Mood and Affect: Mood normal.        Behavior: Behavior normal.     ED Results / Procedures / Treatments   Labs (all labs ordered are listed, but only abnormal results are displayed) Labs Reviewed - No data to display  EKG None  Radiology CT PELVIS WO CONTRAST  Result Date: 02/07/2023 CLINICAL DATA:  Hip trauma with fracture suspected EXAM: CT PELVIS WITHOUT CONTRAST TECHNIQUE: Multidetector CT imaging of the pelvis was performed following the standard protocol without intravenous contrast. RADIATION DOSE REDUCTION: This exam was performed according to the departmental dose-optimization program which includes automated exposure control, adjustment of the mA and/or kV according to patient size and/or use of iterative reconstruction technique. COMPARISON:  Abdominal CT 09/26/2020 FINDINGS: Horizontal fracture through the S3 level with mild anterior cortex wedging. No displacement at the level of the sacral canal or anterior sacral foramina. No clear vertical components at the sacral ala. No acute hip fracture or subluxation. There is a curvilinear lucency and mild  femoral head irregularity superiorly on the right, suspect chronic subchondral fracture as with avascular necrosis, although new from comparison abdominal CT. No acute soft tissue finding. IMPRESSION: 1. Acute, nondisplaced insufficiency fracture traversing the sacrum. 2. Subchondral fracture, often related AVN, at the right femoral head since 2022 abdominal CT. Electronically Signed   By: Tiburcio Pea M.D.   On: 02/07/2023 06:06   CT Lumbar Spine Wo Contrast  Result Date: 02/06/2023 CLINICAL DATA:  Pain after fall, sacral fracture on x-ray EXAM: CT LUMBAR SPINE WITHOUT CONTRAST TECHNIQUE: Multidetector CT imaging of the lumbar spine was performed without intravenous contrast administration. Multiplanar CT image reconstructions were also generated.  RADIATION DOSE REDUCTION: This exam was performed according to the departmental dose-optimization program which includes automated exposure control, adjustment of the mA and/or kV according to patient size and/or use of iterative reconstruction technique. COMPARISON:  02/10/2021 FINDINGS: Segmentation: 5 lumbar type vertebrae. Alignment: Alignment is anatomic. Vertebrae: Imaging is performed from the T10 level through the upper margin of the S3 level. There is partial visualization of an acute fracture through the ventral margin of the S3 vertebral body. This corresponds to the contour abnormality on preceding x-ray. There are no other acute displaced fractures. No destructive bony lesions. Paraspinal and other soft tissues: There is mild edema within the presacral soft tissues adjacent to the S3 fracture. This is incompletely evaluated. Remaining paraspinal and retroperitoneal soft tissues are unremarkable. Visualized portions of the lung bases are clear. Disc levels: There is mild L5-S1 spondylosis without significant central canal or neural foraminal encroachment. Remaining disc spaces are well preserved. Reconstructed images demonstrate no additional findings.  IMPRESSION: 1. Partial visualization of a fracture through the ventral margin of the S3 vertebral body, corresponding to the contour deformity on previous x-ray. Further evaluation with pelvic CT to include the entirety of the sacrum and coccyx may be useful for complete characterization. 2. Otherwise unremarkable CT of the lumbar spine. No other acute displaced fractures. Electronically Signed   By: Sharlet Salina M.D.   On: 02/06/2023 22:11   DG Pelvis 1-2 Views  Result Date: 02/06/2023 CLINICAL DATA:  Pain after fall. EXAM: PELVIS - 1-2 VIEW COMPARISON:  January 02, 2023. FINDINGS: Possible mildly displaced right femoral neck fracture is noted. CT scan is recommended for further evaluation. IMPRESSION: Possible mildly displaced right femoral neck fracture. CT scan is recommended for further evaluation. Electronically Signed   By: Lupita Raider M.D.   On: 02/06/2023 15:27   DG Lumbar Spine Complete  Result Date: 02/06/2023 CLINICAL DATA:  Pain after fall EXAM: LUMBAR SPINE - COMPLETE 5 VIEW COMPARISON:  None Available. FINDINGS: Five lumbar-type vertebral bodies. Osteopenia. Preserved vertebral body height. No listhesis. Mild disc height loss at L5-S1. Mild lower lumbar facet degenerative changes. No spondylolisthesis. Recommend continue precautions until clinical clearance and if there is further concern of injury additional workup with CT is recommended for higher sensitivity IMPRESSION: Mild degenerative changes. Electronically Signed   By: Karen Kays M.D.   On: 02/06/2023 15:26   DG Sacrum/Coccyx  Result Date: 02/06/2023 CLINICAL DATA:  Pain after fall EXAM: SACRUM AND COCCYX - 3 VIEW COMPARISON:  None Available. FINDINGS: Mild degenerative changes of the sacroiliac joints with some sclerosis and small osteophytes. Osteopenia. There is contour deformity along the mid sacrum on the lateral view. Subtle injury is possible. Additional workup with CT may be useful for further delineation of the  potential fracture. Scattered presumed vascular calcifications. IMPRESSION: Contour deformity of the mid sacrum on the lateral view. Possible fracture. Recommend further cross-sectional evaluation such as CT scan. Electronically Signed   By: Karen Kays M.D.   On: 02/06/2023 15:24    Procedures Procedures    Medications Ordered in ED Medications  oxyCODONE-acetaminophen (PERCOCET/ROXICET) 5-325 MG per tablet 1 tablet (has no administration in time range)  lidocaine (LIDODERM) 5 % 1 patch (has no administration in time range)    ED Course/ Medical Decision Making/ A&P                                 Medical Decision Making  Well-appearing 67 year old female presenting emergency department for evaluation after a fall on Saturday.  She is afebrile nontachycardic has remained hemodynamically stable while awaiting close to 14 hours prior to my evaluation.  Workup prior to my arrival with CT pelvis and lumbar spine.  Patient with no acute fractures on lumbar CT scan.  Did show a S3 transverse sacral fracture.  Treating pain.  Will reassess.  Amount and/or Complexity of Data Reviewed External Data Reviewed:     Details: Had x-rays done in urgent care. Labs:     Details: Consider labs, however given reassuring vitals and physical exam low suspicion for cute metabolic derangements. Radiology:     Details: Does not have any other bony tenderness to require imaging  Risk Prescription drug management. Decision regarding hospitalization.         Final Clinical Impression(s) / ED Diagnoses Final diagnoses:  None    Rx / DC Orders ED Discharge Orders     None         Coral Spikes, DO 02/07/23 0900

## 2023-02-09 ENCOUNTER — Other Ambulatory Visit: Payer: Self-pay | Admitting: Family Medicine

## 2023-02-16 ENCOUNTER — Encounter (HOSPITAL_COMMUNITY): Payer: Self-pay | Admitting: Medical

## 2023-02-16 ENCOUNTER — Telehealth (HOSPITAL_COMMUNITY): Payer: Medicare Other | Admitting: Medical

## 2023-02-16 DIAGNOSIS — Z9889 Other specified postprocedural states: Secondary | ICD-10-CM

## 2023-02-16 DIAGNOSIS — Z7409 Other reduced mobility: Secondary | ICD-10-CM

## 2023-02-16 DIAGNOSIS — F1021 Alcohol dependence, in remission: Secondary | ICD-10-CM

## 2023-02-16 DIAGNOSIS — K709 Alcoholic liver disease, unspecified: Secondary | ICD-10-CM

## 2023-02-16 DIAGNOSIS — F4312 Post-traumatic stress disorder, chronic: Secondary | ICD-10-CM

## 2023-02-16 DIAGNOSIS — Z79899 Other long term (current) drug therapy: Secondary | ICD-10-CM

## 2023-02-16 DIAGNOSIS — T1490XA Injury, unspecified, initial encounter: Secondary | ICD-10-CM

## 2023-02-16 DIAGNOSIS — Z8781 Personal history of (healed) traumatic fracture: Secondary | ICD-10-CM

## 2023-02-16 DIAGNOSIS — Z62819 Personal history of unspecified abuse in childhood: Secondary | ICD-10-CM

## 2023-02-16 DIAGNOSIS — I10 Essential (primary) hypertension: Secondary | ICD-10-CM

## 2023-02-16 DIAGNOSIS — L718 Other rosacea: Secondary | ICD-10-CM

## 2023-02-16 DIAGNOSIS — K219 Gastro-esophageal reflux disease without esophagitis: Secondary | ICD-10-CM

## 2023-02-16 DIAGNOSIS — F1291 Cannabis use, unspecified, in remission: Secondary | ICD-10-CM | POA: Diagnosis not present

## 2023-02-16 DIAGNOSIS — E8889 Other specified metabolic disorders: Secondary | ICD-10-CM

## 2023-02-16 DIAGNOSIS — Z8669 Personal history of other diseases of the nervous system and sense organs: Secondary | ICD-10-CM

## 2023-02-16 DIAGNOSIS — Z8659 Personal history of other mental and behavioral disorders: Secondary | ICD-10-CM

## 2023-02-16 MED ORDER — PROPRANOLOL HCL 10 MG PO TABS: ORAL_TABLET | ORAL | 2 refills | Status: DC

## 2023-02-16 MED ORDER — BUSPIRONE HCL 15 MG PO TABS: 15.0000 mg | ORAL_TABLET | Freq: Three times a day (TID) | ORAL | 2 refills | Status: DC

## 2023-02-16 MED ORDER — TRAZODONE HCL 50 MG PO TABS: 50.0000 mg | ORAL_TABLET | Freq: Every day | ORAL | 0 refills | Status: DC

## 2023-02-16 MED ORDER — BUPROPION HCL ER (XL) 150 MG PO TB24: 150.0000 mg | ORAL_TABLET | ORAL | 0 refills | Status: DC

## 2023-02-16 NOTE — Progress Notes (Signed)
BH MD/PA/NP OP Progress Note  02/16/2023 2:42 PM Terri Ayala  MRN:  [id number] Virtual Visit via Video Note  I connected with Terri Ayala on 02/16/23 at  1:30 PM EST by a video enabled telemedicine application and verified that I am speaking with the correct person using two identifiers.  Location: Patient: At home Provider: [facility]   I discussed the limitations of evaluation and management by telemedicine and the availability of in person appointments. The patient expressed understanding and agreed to proceed.   History of Present Illness:See EPIC note    Observations/Objective:See EPIC note   Assessment and Plan:See EPIC note   Follow Up Instructions:See EPIC note    I discussed the assessment and treatment plan with the patient. The patient was provided an opportunity to ask questions and all were answered. The patient agreed with the plan and demonstrated an understanding of the instructions.   The patient was advised to call back or seek an in-person evaluation if the symptoms worsen or if the condition fails to improve as anticipated.  I provided 20 minutes of non-face-to-face time during this encounter.   [name], PA-C   Chief Complaint:  Chief Complaint  Patient presents with   Follow-up   Alcohol Problem   Addiction Problem   Anxiety   Trauma   Stress   HPI: Terri Ayala returns for a scheduled 3 month FU for medication management following discharge from [facility]  for her Alcohol dependence and associated co occurring PTSD with sequellae.She was discharged 12/15/2021 . She reports ongoing sobriety. She is doing well with current medications except for occasional anxiety/panic triggered by worrying thoughts-usually related to finances. Review of Care Everywhere reveals ED visits for coccygeal pain from fall in early December with no complaint today.    Visit Diagnosis:    ICD-10-CM   1. Alcohol use disorder, severe, in sustained remission, dependence  (HCC)  F10.21     2. Cannabis use disorder in remission  F12.91     3. Trauma in childhood  T14.90XA     4. Chronic post-traumatic stress disorder (PTSD)  F43.12     5. History of major depression  Z86.59     6. Alcoholic liver disorder (HCC)  K70.9     7. Status post open reduction with internal fixation (ORIF) of fracture of ankle  Z98.890    Z87.81     8. Hx of migraines  Z86.69     9. Rosacea keratitis  L71.8     10. Inherited disorder of folate metabolism (HCC)  E88.89     11. Medication management  Z79.899     12. Impaired mobility and ADLs  Z74.09    Z78.9       Past Psychiatric History:  Drug Induced Encephalopathy  Admit date: 02/15/2022 Discharge date:   02/25/2022   Multiple visit to EDs [facility]   [facility] Date of Admission: 08/18/2021 Date of Discharge: 10/19-20/2023  Past Medical History:  [facility] Arrival date & time: 02/06/23  1225 Final Clinical Impressions(s) / UC Diagnoses   Final diagnoses:  Contusion of coccyx, initial encounter  Traumatic coccydynia  Lumbar back pain  Fall, initial encounter    Past Medical History:  Diagnosis Date   Alcohol withdrawal (HCC) 11/2017; 01/04/2018   Alcoholism (HCC)    Anxiety    Arthritis    "hands" (01/04/2018)   ASYMPTOMATIC POSTMENOPAUSAL STATUS 08/17/2009   Chronic lower back pain    Depression  DYSPHAGIA 06/19/2007   Fatty liver, alcoholic    GERD (gastroesophageal reflux disease)    GOITER, MULTINODULAR 08/17/2009   History of colonic polyps 03/25/2020   Hyperlipidemia    Hypertension    Migraines    "not sure what triggers them; I'll have 1 q couple weeks or more; come 2 days in a row when they come" (01/04/2018)   Mitral valve prolapse    OSTEOPOROSIS 08/17/2009   PONV (postoperative nausea and vomiting)    Supraventricular tachycardia (HCC)     Past Surgical History:  Procedure Laterality Date   BREAST BIOPSY Right    "benign"   CATARACT EXTRACTION  W/ INTRAOCULAR LENS  IMPLANT, BILATERAL     CERVICAL CONE BIOPSY  2000s   CERVIX LESION DESTRUCTION  1991   "dysplasia; lesions"   CHOLECYSTECTOMY N/A 03/14/2013   Procedure: LAPAROSCOPIC CHOLECYSTECTOMY WITH ATTEMPTED INTRAOPERATIVE CHOLANGIOGRAM;  Surgeon: Shelly Rubenstein, MD;  Location: [facility] OR;  Service: General;  Laterality: N/A;   HARDWARE REMOVAL Left 07/22/2021   Procedure: HARDWARE REMOVAL;  Surgeon: Myrene Galas, MD;  Location: [facility] OR;  Service: Orthopedics;  Laterality: Left;   laporoscopic abdominal surgery     "for endometriosis"   LEFT HEART CATH AND CORONARY ANGIOGRAPHY N/A 05/07/2018   Procedure: LEFT HEART CATH AND CORONARY ANGIOGRAPHY;  Surgeon: Lyn Records, MD;  Location: [facility] INVASIVE CV LAB;  Service: Cardiovascular;  Laterality: N/A;   ORIF ANKLE FRACTURE Left 02/11/2021   Procedure: OPEN REDUCTION INTERNAL FIXATION (ORIF) ANKLE FRACTURE;  Surgeon: Myrene Galas, MD;  Location: [facility] OR;  Service: Orthopedics;  Laterality: Left;   TONSILLECTOMY     Family Psychiatric History:  Maternal uncle alcoholic    Family History:           Family History  Problem Relation Age of Onset   Colon cancer Father          Deceased, 51   Osteoporosis Mother          Living, 3   Healthy Brother     Healthy Brother     Healthy Son     Healthy Son     Thyroid disease Neg Hx     Goiter Neg Hx             Social History:  Social History    Socioeconomic History              Marital status: Single      Spouse name: NA   Number of children: None   Years of education: 16   Highest education level: BS Biology degree  Occupational History   Occupation: Hydrographic surveyor - retired      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[employer]  Tobacco Use   Smoking status: Former      Packs/day: 1.00      Years: 7.00      Pack years: 7.00      Types: Cigarettes      Quit date: 02/28/1974      Years since quitting: 47.3   Smokeless tobacco: Never   Tobacco comments:      01/04/2018 "smoked when I  was a teenager"  Vaping Use   Vaping Use: Never used  Substance and Sexual Activity   Alcohol use: Yes      Alcohol/week: 1-3 bottles of wine daily      Types: Wine      Comment: Detox 06/2021 [facility] 6/23-8/25/2023   Drug use: 2023/2024 CBD Gummies resulting in Metabolic encephalitis  Sexual activity: Not Currently  Other Topics Concern   Mr. Ariela Bendon who has POA. (Brother)  Social History Narrative    Lives alone.  She has a BS biology.    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       Social Drivers of Corporate investment banker Strain: Low Risk  (01/22/2023)   Overall Financial Resource Strain (CARDIA)    Difficulty of Paying Living Expenses: Not very hard  Recent Concern: Financial Resource Strain - Medium Risk (12/27/2022)   Overall Financial Resource Strain (CARDIA)    Difficulty of Paying Living Expenses: Somewhat hard  Food Insecurity: No Food Insecurity (01/23/2023)   Hunger Vital Sign    Worried About Running Out of Food in the Last Year: Never true    Ran Out of Food in the Last Year: Never true  Transportation Needs: No Transportation Needs (01/23/2023)   PRAPARE - Administrator, Civil Service (Medical): No    Lack of Transportation (Non-Medical): No  Recent Concern: Transportation Needs - Unmet Transportation Needs (12/27/2022)   PRAPARE - Transportation    Lack of Transportation (Medical): Yes    Lack of Transportation (Non-Medical): Yes  Physical Activity: Insufficiently Active (01/22/2023)   Exercise Vital Sign    Days of Exercise per Week: 2 days    Minutes of Exercise per Session: 20 min  Stress: No Stress Concern Present (01/22/2023)   [employer] of Occupational Health - Occupational Stress Questionnaire    Feeling of Stress : Only a little  Social Connections: Socially Isolated (01/23/2023)   Social Connection and Isolation Panel [NHANES]    Frequency of Communication with Friends and Family: Once a  week    Frequency of Social Gatherings with Friends and Family: Once a week    Attends Religious Services: 1 to 4 times per year    Active Member of [employer] or Organizations: No    Attends Banker Meetings: Never    Marital Status: Never married      Allergies:  Allergies           Allergies  Allergen Reactions   Doxycycline Swelling      Mouth swelling and sores   Other Reaction(s): Not available   Codeine Itching      Other Reaction(s): Not available   Tetracycline Hcl Swelling      Mouth swelling and sores       Therapeutic Level Labs:NA   Metabolic Disorder Labs: Lab Results  Component Value Date   HGBA1C 4.7 (L) 11/21/2020   MPG 88.19 11/21/2020   MPG 97 03/19/2013   No results found for: "PROLACTIN" Lab Results  Component Value Date   CHOL 193 04/22/2022   TRIG 104 04/22/2022   HDL 49 04/22/2022   CHOLHDL 3.9 04/22/2022   VLDL 10 10/04/2012   LDLCALC 125 (H) 04/22/2022   LDLCALC 168 (H) 12/15/2021   Lab Results  Component Value Date   TSH 3.018 02/16/2022   TSH 1.753 01/26/2022    Therapeutic Level Labs:NA  Current Medications: Current Outpatient Medications  Medication Sig Dispense Refill   propranolol (INDERAL) 10 MG tablet Take 1-2 tablets at onset of panic no more than 2 times daily 120 tablet 2   alendronate (FOSAMAX) 70 MG tablet Take 70 mg by mouth every Monday.     buPROPion (WELLBUTRIN XL) 150 MG 24 hr tablet Take 1 tablet (150 mg total) by mouth every morning. 90 tablet 0  busPIRone (BUSPAR) 15 MG tablet Take 1 tablet (15 mg total) by mouth 3 (three) times daily. 90 tablet 2   Calcium Carbonate-Vitamin D (CALCIUM 600+D PO) Take 1 tablet by mouth daily.     carvedilol (COREG) 6.25 MG tablet TAKE 1 TABLET BY MOUTH 2 TIMES DAILY WITH A MEAL. 180 tablet 1   fluticasone (FLONASE) 50 MCG/ACT nasal spray Place 2 sprays into both nostrils daily as needed for allergies.     fluticasone (FLOVENT HFA) 110 MCG/ACT inhaler 2 puff(s) Puff  and swallow/ not for asthma, for EOE 2 times a day for 30 day(s) (Patient not taking: Reported on 01/23/2023)     folic acid (FOLVITE) 1 MG tablet Take 1 tablet (1 mg total) by mouth daily. 30 tablet 1   gabapentin (NEURONTIN) 300 MG capsule TAKE 1 CAPSULE BY MOUTH 2 TIMES DAILY. 60 capsule 2   lidocaine 4 % Place 1 patch onto the skin daily. 15 patch 0   meloxicam (MOBIC) 15 MG tablet Take 1 tablet (15 mg total) by mouth daily as needed for pain. 30 tablet 1   Multiple Vitamins-Minerals (ADULT GUMMY PO) Take 2 capsules by mouth daily.     norethindrone-ethinyl estradiol (FEMHRT 1/5) 1-5 MG-MCG TABS tablet Take 1 tablet by mouth daily.     oxyCODONE-acetaminophen (PERCOCET/ROXICET) 5-325 MG tablet Take 1 tablet by mouth every 6 (six) hours as needed for severe pain (pain score 7-10). 15 tablet 0   pantoprazole (PROTONIX) 40 MG tablet TAKE 1 TABLET BY MOUTH 2 TIMES DAILY. 180 tablet 0   SUMAtriptan (IMITREX) 100 MG tablet TAKE 1 TABLET (100 MG TOTAL) BY MOUTH AS NEEDED FOR MIGRAINE. MAY REPEAT IN 2 HOURS IF HEADACHE PERSISTS OR RECURS. 15 tablet 1   traZODone (DESYREL) 50 MG tablet Take 1 tablet (50 mg total) by mouth at bedtime. 90 tablet 0   tretinoin (RETIN-A) 0.025 % cream 1 application in the evening to face Externally Once a day, PM pea size amount for 30 days     No current facility-administered medications for this visit.     Musculoskeletal: Strength & Muscle Tone: Telepsych visit-Grossly normal Musculoskeletal and cranial nerve inspections Gait & Station: NA Patient leans: N/A  Psychiatric Specialty Exam: Review of Systems  Constitutional:  Positive for activity change (recent coccyx injury per PMH). Negative for appetite change, chills, diaphoresis, fatigue, fever and unexpected weight change.  HENT:  Negative for congestion, postnasal drip, rhinorrhea, sinus pressure, sinus pain, sneezing, sore throat, tinnitus, trouble swallowing and voice change.   Eyes:  Negative for  photophobia, pain, discharge, redness, itching and visual disturbance.  Respiratory:  Negative for apnea, cough, choking, chest tightness, shortness of breath, wheezing and stridor.   Cardiovascular:  Negative for chest pain, palpitations and leg swelling.  Gastrointestinal:  Negative for abdominal distention, abdominal pain, anal bleeding, constipation, diarrhea, nausea, rectal pain and vomiting.  Endocrine: Negative for cold intolerance, heat intolerance, polydipsia, polyphagia and polyuria.  Musculoskeletal:  Positive for back pain (Coccyx) and gait problem. Negative for joint swelling, myalgias, neck pain and neck stiffness.  Skin:  Positive for color change and rash. Negative for pallor and wound.       Rosacea  Allergic/Immunologic: Negative for environmental allergies, food allergies and immunocompromised state.  Neurological:  Positive for headaches (Hx Migraines). Negative for tremors, seizures, syncope, speech difficulty, weakness, light-headedness and numbness.  Psychiatric/Behavioral:  Positive for sleep disturbance (Rx Trazodone). Negative for agitation, behavioral problems, confusion, decreased concentration, dysphoric mood, hallucinations, self-injury and suicidal ideas.  The patient is nervous/anxious. The patient is not hyperactive.   All other systems reviewed and are negative.   There were no vitals taken for this visit.There is no height or weight on file to calculate BMI. Updated Vital Signs 02/06/23 [facility]  BP (!) 141/76 (BP Location: Left Arm)   Pulse 85   Temp 98 F (36.7 C) (Oral)   Resp 18   SpO2 98%        Pain Score 02/06/23 1246 9    General Appearance: Casual and Fairly Groomed  Eye Contact:  Good  Speech:  Clear and Coherent and Normal Rate  Volume:  Normal  Mood:  Euthymic  Affect:  Congruent  Thought Process:  Coherent, Goal Directed, and Descriptions of Associations: Intact  Orientation:  Full (Time, Place, and Person)  Thought Content:  WDL and Logical   Suicidal Thoughts:  No  Homicidal Thoughts:  No  Memory:   Trauma informed  Judgement:  Fair  Insight:  Fair  Psychomotor Activity:  NA  Concentration:  Concentration: Good and Attention Span: Good  Recall:  Fair  Fund of Knowledge:  WDL  Language: Good  Akathisia:  NA  Handed:  Right  AIMS (if indicated): NA  Assets:  Communication Skills Desire for Improvement Financial Resources/Insurance Housing Resilience Social Support Talents/Skills Vocational/Educational  ADL's:  Intact  Cognition: WDL  Sleep:   With med   Screenings: AUDIT    Flowsheet Row [facility]  Alcohol Use Disorder Identification Test Final Score (AUDIT) 18 21      GAD-7    Flowsheet Row Office Visit from 12/28/2022 in [facility] Visit from 10[facility]17/2022 in [facility] at [facility] Visit from 08/23/2022 in [facility] at [facility] Clinical Support from 12/07/2021 in [facility] at [facility] Counselor from 09/03/2021 [facility]  Total GAD-7 Score 2 4 1  0 3      PHQ2-9    Flowsheet Row Clinical Support from 01/23/2023 in [facility] at [facility] Office Visit from 12/28/2022 in [facility] at [facility] Office Visit from 10/17/2022 in [facility] at [facility] Office Visit from 08/23/2022 in [facility] Primary Care at [facility] Clinical Support from 12/07/2021 in [facility] Care at [facility]  PHQ-2 Total Score 0 0 1 0 0  PHQ-9 Total Score 0 4 5 2 1       Flowsheet Row ED from 02/06/2023 in [facility] Emergency Department at [facility] Most recent reading at 02/06/2023  7:46 PM ED from 02/06/2023 in [facility] at [facility]) Most recent reading at 02/06/2023 12:48 PM ED from 12/29/2022 in [facility]gent Care at [facility]) Most recent reading at 12/29/2022  9:48 AM  C-SSRS RISK CATEGORY No Risk No Risk No Risk        Assessment: Sober and Stable for most part.Anxiety /panic per HPI   and Plan: Continue current medications. Trial Propranolol 10-20 mg at start of anxiety/panic trigger no more than BID. FU 3 mos -sooner if needed    [name], PA-C 02/16/2023, 2:42 PM

## 2023-03-08 ENCOUNTER — Telehealth (HOSPITAL_COMMUNITY): Payer: Self-pay | Admitting: Licensed Clinical Social Worker

## 2023-03-08 NOTE — Telephone Encounter (Signed)
 Terri Ayala leaves a message saying that she would like to return to [facility]. The therapist returns her call leaving a HIPAA-compliant voicemail.  Terri Blazer, MA, LCSW, [facility], LCAS 03/08/2023

## 2023-03-17 ENCOUNTER — Telehealth (HOSPITAL_COMMUNITY): Payer: Self-pay | Admitting: Licensed Clinical Social Worker

## 2023-03-17 NOTE — Telephone Encounter (Signed)
The therapist returns Ellarose's call leaving another, HIPAA-compliant voicemail.  Myrna Blazer, MA, LCSW, [facility], LCAS 03/17/2023

## 2023-04-01 ENCOUNTER — Other Ambulatory Visit: Payer: Self-pay | Admitting: Family Medicine

## 2023-04-01 DIAGNOSIS — F1020 Alcohol dependence, uncomplicated: Secondary | ICD-10-CM

## 2023-04-06 ENCOUNTER — Encounter: Payer: Self-pay | Admitting: Family Medicine

## 2023-04-14 ENCOUNTER — Other Ambulatory Visit (HOSPITAL_COMMUNITY): Payer: Self-pay | Admitting: Medical

## 2023-04-14 ENCOUNTER — Telehealth (HOSPITAL_COMMUNITY): Payer: Self-pay

## 2023-04-14 MED ORDER — BACLOFEN 10 MG PO TABS: 10.0000 mg | ORAL_TABLET | Freq: Three times a day (TID) | ORAL | 1 refills | Status: DC

## 2023-04-14 NOTE — Telephone Encounter (Signed)
Patient is calling for a refill on her Baclofen, it is no longer in her medication list and no mention of it in your last note. Please review and advise, thank you

## 2023-04-26 ENCOUNTER — Ambulatory Visit: Payer: Federal, State, Local not specified - PPO | Admitting: Podiatry

## 2023-04-27 ENCOUNTER — Ambulatory Visit: Payer: Federal, State, Local not specified - PPO | Admitting: Podiatry

## 2023-05-01 ENCOUNTER — Ambulatory Visit (INDEPENDENT_AMBULATORY_CARE_PROVIDER_SITE_OTHER): Payer: Medicare Other | Admitting: Family Medicine

## 2023-05-01 ENCOUNTER — Encounter: Payer: Self-pay | Admitting: Family Medicine

## 2023-05-01 VITALS — BP 112/74 | HR 106 | Ht 62.0 in | Wt 127.0 lb

## 2023-05-01 DIAGNOSIS — F1020 Alcohol dependence, uncomplicated: Secondary | ICD-10-CM | POA: Diagnosis not present

## 2023-05-01 DIAGNOSIS — G621 Alcoholic polyneuropathy: Secondary | ICD-10-CM | POA: Diagnosis not present

## 2023-05-01 DIAGNOSIS — G43009 Migraine without aura, not intractable, without status migrainosus: Secondary | ICD-10-CM | POA: Diagnosis not present

## 2023-05-01 DIAGNOSIS — I10 Essential (primary) hypertension: Secondary | ICD-10-CM

## 2023-05-01 NOTE — Assessment & Plan Note (Addendum)
 Continue home PT. Continue gabapentin 300 mg twice daily.  Once she has stopped using alcohol again, may consider increasing gabapentin dosing.

## 2023-05-01 NOTE — Assessment & Plan Note (Signed)
 Contacting behavioral health specialist to discuss possibility for outpatient management and support to discontinue alcohol use.  She does have an upcoming appointment with behavioral health on 05/18/2023 as well.

## 2023-05-01 NOTE — Progress Notes (Signed)
 Established Patient Office Visit  Subjective   Patient ID: Terri Ayala, female    DOB: Apr 23, 1955  Age: 68 y.o. MRN: [id number]  Chief Complaint  Patient presents with   Hypertension    HPI Terri Ayala is a 68 y.o. female presenting today for follow up of hypertension, neuropathy, migraine.  Pt denies chest pain, SOB, dizziness, edema, syncope, fatigue or heart palpitations. Taking propranolol, reports excellent compliance with treatment. Denies side effects.  Currently taking gabapentin 300 mg twice daily for neuropathy.  She takes Imitrex 100 mg infrequently for migraine.  She has a history of alcohol dependence and was sober for quite some time, but she states that she "fell off the wagon" on Christmas Eve.  Since that time, she has been drinking 1-1.5 bottles of wine each day.  She is interested in discontinuing alcohol use again but is concerned about withdrawal symptoms.  She states that due to cost, she cannot go to a facility that she needs to stay overnight.  Outpatient Medications Prior to Visit  Medication Sig   alendronate (FOSAMAX) 70 MG tablet Take 70 mg by mouth every Monday.   baclofen (LIORESAL) 10 MG tablet Take 1 tablet (10 mg total) by mouth 3 (three) times daily.   buPROPion (WELLBUTRIN XL) 150 MG 24 hr tablet Take 1 tablet (150 mg total) by mouth every morning.   busPIRone (BUSPAR) 15 MG tablet Take 1 tablet (15 mg total) by mouth 3 (three) times daily.   Calcium Carbonate-Vitamin D (CALCIUM 600+D PO) Take 1 tablet by mouth daily.   carvedilol (COREG) 6.25 MG tablet TAKE 1 TABLET BY MOUTH 2 TIMES DAILY WITH A MEAL.   fluticasone (FLONASE) 50 MCG/ACT nasal spray Place 2 sprays into both nostrils daily as needed for allergies.   fluticasone (FLOVENT HFA) 110 MCG/ACT inhaler    folic acid (FOLVITE) 1 MG tablet TAKE 1 TABLET BY MOUTH DAILY.   gabapentin (NEURONTIN) 300 MG capsule TAKE 1 CAPSULE BY MOUTH 2 TIMES DAILY.   hydrOXYzine (ATARAX) 25 MG tablet Take 25 mg by  mouth 3 (three) times daily.   lidocaine 4 % Place 1 patch onto the skin daily.   meloxicam (MOBIC) 15 MG tablet Take 1 tablet (15 mg total) by mouth daily as needed for pain.   methocarbamol (ROBAXIN) 750 MG tablet Take 750 mg by mouth at bedtime as needed.   Multiple Vitamins-Minerals (ADULT GUMMY PO) Take 2 capsules by mouth daily.   norethindrone-ethinyl estradiol (FEMHRT 1/5) 1-5 MG-MCG TABS tablet Take 1 tablet by mouth daily.   oxyCODONE-acetaminophen (PERCOCET/ROXICET) 5-325 MG tablet Take 1 tablet by mouth every 6 (six) hours as needed for severe pain (pain score 7-10).   pantoprazole (PROTONIX) 40 MG tablet TAKE 1 TABLET BY MOUTH 2 TIMES DAILY.   propranolol (INDERAL) 10 MG tablet Take 1-2 tablets at onset of panic no more than 2 times daily   SUMAtriptan (IMITREX) 100 MG tablet TAKE 1 TABLET (100 MG TOTAL) BY MOUTH AS NEEDED FOR MIGRAINE. MAY REPEAT IN 2 HOURS IF HEADACHE PERSISTS OR RECURS.   traZODone (DESYREL) 50 MG tablet Take 1 tablet (50 mg total) by mouth at bedtime.   tretinoin (RETIN-A) 0.025 % cream 1 application in the evening to face Externally Once a day, PM pea size amount for 30 days   No facility-administered medications prior to visit.    ROS Negative unless otherwise noted in HPI   Objective:     BP 112/74   Pulse (!) 106  Ht 5\' 2"  (1.575 m)   Wt 127 lb (57.6 kg)   SpO2 100%   BMI 23.23 kg/m   Physical Exam Constitutional:      General: She is not in acute distress.    Appearance: Normal appearance.  HENT:     Head: Normocephalic and atraumatic.  Cardiovascular:     Rate and Rhythm: Regular rhythm. Tachycardia present.     Heart sounds: No murmur heard.    No friction rub. No gallop.  Pulmonary:     Effort: Pulmonary effort is normal. No respiratory distress.     Breath sounds: No wheezing, rhonchi or rales.  Skin:    General: Skin is warm and dry.  Neurological:     Mental Status: She is alert and oriented to person, place, and time.      Assessment & Plan:  Benign essential HTN Assessment & Plan: Last appointment with cardiology on 04/22/2022 with recommendation to follow-up in 1 year, this has not been scheduled yet. The 10-year ASCVD risk score (Arnett DK, et al., 2019) is: 8%, BP goal <130/80. Stable, continue carvedilol 6.25 mg twice daily.  Continue ambulatory blood pressure monitoring.  Will continue to monitor.   Migraine without aura and without status migrainosus, not intractable Assessment & Plan: Stable.  Continue Imitrex 100 mg tablet as needed for acute rescue.   Alcoholic peripheral neuropathy (HCC) Assessment & Plan: Continue home PT. Continue gabapentin 300 mg twice daily.  Once she has stopped using alcohol again, may consider increasing gabapentin dosing.   Uncomplicated alcohol dependence (HCC) Assessment & Plan: Contacting behavioral health specialist to discuss possibility for outpatient management and support to discontinue alcohol use.  She does have an upcoming appointment with behavioral health on 05/18/2023 as well.     Return in about 2 months (around 07/01/2023) for follow-up for alcohol dependence, neuropathy.    Melida Quitter, PA

## 2023-05-01 NOTE — Patient Instructions (Addendum)
 I will contact your behavioral health provider to ask about starting medication for you.   Please call your cardiologist to schedule your 1 year follow-up as soon as possible.

## 2023-05-01 NOTE — Assessment & Plan Note (Signed)
Stable.  Continue Imitrex 100 mg tablet as needed for acute rescue.

## 2023-05-01 NOTE — Assessment & Plan Note (Signed)
 Last appointment with cardiology on 04/22/2022 with recommendation to follow-up in 1 year, this has not been scheduled yet. The 10-year ASCVD risk score (Arnett DK, et al., 2019) is: 8%, BP goal <130/80. Stable, continue carvedilol 6.25 mg twice daily.  Continue ambulatory blood pressure monitoring.  Will continue to monitor.

## 2023-05-08 ENCOUNTER — Other Ambulatory Visit: Payer: Self-pay | Admitting: Family Medicine

## 2023-05-18 ENCOUNTER — Telehealth (HOSPITAL_COMMUNITY): Payer: Medicare Other | Admitting: Medical

## 2023-05-25 ENCOUNTER — Telehealth (HOSPITAL_COMMUNITY): Admitting: Medical

## 2023-05-25 ENCOUNTER — Encounter (HOSPITAL_COMMUNITY): Payer: Self-pay | Admitting: Medical

## 2023-05-25 DIAGNOSIS — F1021 Alcohol dependence, in remission: Secondary | ICD-10-CM | POA: Diagnosis not present

## 2023-05-25 NOTE — Progress Notes (Signed)
 Established Patient Office Visit  Subjective   Patient ID: Terri Ayala, female    DOB: 09/21/1955  Age: 68 y.o. MRN: [id number]  No chief complaint on file. Relapse into alcoholic drinking  HPI; Pt is seen Virtually having failed to FU from appointment 02/16/2023 :  HPI: Terri Ayala returns for a scheduled 3 month FU for medication management following discharge from [facility]  for her Alcohol dependence and associated co occurring PTSD with sequellae.She was discharged 12/15/2021 . She reports ongoing sobriety. She is doing well with current medications except for occasional anxiety/panic triggered by worrying thoughts-usually related to finances. Review of Care Everywhere reveals ED visits for coccygeal pain from fall in early December with no complaint today. Assessment: Sober and Stable for most part.Anxiety /panic per HPI  and Plan: Continue current medications. Trial Propranolol 10-20 mg at start of anxiety/panic trigger no more than BID. FU 3 mos -sooner if needed  [name], PA-C 02/16/2023, 2:42 PM   On 05/01/2023 patient saw PCP and admiited return to use. Review of EPIC revealed 2 phone calls to Alene Mires LCSW [facility] Counselor 1/08 and 03/17/2023 requesting return to IOP but she did not return his calls PCP contacted this provider who advised Detox and referral to Counselor Shellman.  Today Terri Ayala reports her last drink was last nite.When asked what she wanted to do at this point she was willing to do short term inpt but not residential treatment.sHE SAYS "tHE HOLIDAYS GOT ME DOWN".  Past Psychiatric History:  Drug Induced Encephalopathy  Admit date: 02/15/2022 Discharge date:   02/25/2022   Multiple visit to EDs [facility] 2017   [facility] Date of Admission: 08/18/2021 Date of Discharge: 10/19-20/2023   Past Medical History:  [facility] Arrival date & time: 02/06/23  1225 Final Clinical Impressions(s) / UC Diagnoses    Final diagnoses:  Contusion of  coccyx, initial encounter  Traumatic coccydynia  Lumbar back pain  Fall, initial encounter        Past Medical History:  Diagnosis Date   Alcohol withdrawal (HCC) 11/2017; 01/04/2018   Alcoholism (HCC)     Anxiety     Arthritis      "hands" (01/04/2018)   ASYMPTOMATIC POSTMENOPAUSAL STATUS 08/17/2009   Chronic lower back pain     Depression     DYSPHAGIA 06/19/2007   Fatty liver, alcoholic     GERD (gastroesophageal reflux disease)     GOITER, MULTINODULAR 08/17/2009   History of colonic polyps 03/25/2020   Hyperlipidemia     Hypertension     Migraines      "not sure what triggers them; I'll have 1 q couple weeks or more; come 2 days in a row when they come" (01/04/2018)   Mitral valve prolapse     OSTEOPOROSIS 08/17/2009   PONV (postoperative nausea and vomiting)     Supraventricular tachycardia (HCC)               Past Surgical History:  Procedure Laterality Date   BREAST BIOPSY Right      "benign"   CATARACT EXTRACTION W/ INTRAOCULAR LENS  IMPLANT, BILATERAL       CERVICAL CONE BIOPSY   2000s   CERVIX LESION DESTRUCTION   1991    "dysplasia; lesions"   CHOLECYSTECTOMY N/A 03/14/2013    Procedure: LAPAROSCOPIC CHOLECYSTECTOMY WITH ATTEMPTED INTRAOPERATIVE CHOLANGIOGRAM;  Surgeon: Shelly Rubenstein, MD;  Location: [facility] OR;  Service: General;  Laterality: N/A;   HARDWARE REMOVAL Left 07/22/2021  Procedure: HARDWARE REMOVAL;  Surgeon: Myrene Galas, MD;  Location: [facility] OR;  Service: Orthopedics;  Laterality: Left;   laporoscopic abdominal surgery        "for endometriosis"   LEFT HEART CATH AND CORONARY ANGIOGRAPHY N/A 05/07/2018    Procedure: LEFT HEART CATH AND CORONARY ANGIOGRAPHY;  Surgeon: Lyn Records, MD;  Location: [facility] INVASIVE CV LAB;  Service: Cardiovascular;  Laterality: N/A;   ORIF ANKLE FRACTURE Left 02/11/2021    Procedure: OPEN REDUCTION INTERNAL FIXATION (ORIF) ANKLE FRACTURE;  Surgeon: Myrene Galas, MD;  Location: [facility] OR;  Service: Orthopedics;   Laterality: Left;   TONSILLECTOMY            Family Psychiatric History:  Maternal uncle alcoholic    Family History:           Family History  Problem Relation Age of Onset   Colon cancer Father          Deceased, 2   Osteoporosis Mother          Living, 35   Healthy Brother     Healthy Brother     Healthy Son     Healthy Son     Thyroid disease Neg Hx     Goiter Neg Hx             Social History:  Social History    Socioeconomic History              Marital status: Single      Spouse name: NA   Number of children: None   Years of education: 16   Highest education level: BS Biology degree  Occupational History   Occupation: Hydrographic surveyor - retired      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[employer]  Tobacco Use   Smoking status: Former      Packs/day: 1.00      Years: 7.00      Pack years: 7.00      Types: Cigarettes      Quit date: 02/28/1974      Years since quitting: 47.3   Smokeless tobacco: Never   Tobacco comments:      01/04/2018 "smoked when I was a teenager"  Vaping Use   Vaping Use: Never used  Substance and Sexual Activity   Alcohol use: Yes      Alcohol/week: 1-3 bottles of wine daily      Types: Wine      Comment: Detox 06/2021 [facility] 6/23-8/25/2023   Drug use: 2023/2024 CBD Gummies resulting in Metabolic encephalitis   Sexual activity: Not Currently  Other Topics Concern   Mr. Terri Ayala who has POA. (Brother)  Social History Narrative    Lives alone.  She has a BS biology.    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        Social Drivers of Acupuncturist Strain: Low Risk  (01/22/2023)    Overall Financial Resource Strain (CARDIA)     Difficulty of Paying Living Expenses: Not very hard  Recent Concern: Financial Resource Strain - Medium Risk (12/27/2022)    Overall Financial Resource Strain (CARDIA)     Difficulty of Paying Living Expenses: Somewhat hard  Food Insecurity: No Food Insecurity  (01/23/2023)    Hunger Vital Sign     Worried About Running Out of Food in the Last Year: Never true     Ran Out of Food in the Last Year: Never  true  Transportation Needs: No Transportation Needs (01/23/2023)    PRAPARE - Therapist, art (Medical): No     Lack of Transportation (Non-Medical): No  Recent Concern: Transportation Needs - Unmet Transportation Needs (12/27/2022)    PRAPARE - Transportation     Lack of Transportation (Medical): Yes     Lack of Transportation (Non-Medical): Yes  Physical Activity: Insufficiently Active (01/22/2023)    Exercise Vital Sign     Days of Exercise per Week: 2 days     Minutes of Exercise per Session: 20 min  Stress: No Stress Concern Present (01/22/2023)    [employer] of Occupational Health - Occupational Stress Questionnaire     Feeling of Stress : Only a little  Social Connections: Socially Isolated (01/23/2023)    Social Connection and Isolation Panel [NHANES]     Frequency of Communication with Friends and Family: Once a week     Frequency of Social Gatherings with Friends and Family: Once a week     Attends Religious Services: 1 to 4 times per year     Active Member of [employer] or Organizations: No     Attends Banker Meetings: Never     Marital Status: Never married        Allergies:  Allergies           Allergies  Allergen Reactions   Doxycycline Swelling      Mouth swelling and sores   Other Reaction(s): Not available   Codeine Itching      Other Reaction(s): Not available   Tetracycline Hcl Swelling      Mouth swelling and sores        Medications    alendronate (FOSAMAX) 70 MG tablet Take 70 mg by mouth every Monday. Informant: Self, Last Dose: Not Recorded Received from: [facility] - Dr. Annita Brod Taking Not Taking Unknown Today Yesterday baclofen (LIORESAL) 10 MG tablet Take 1 tablet (10 mg total) by mouth 3 (three) times daily., Starting Fri 04/14/2023, Until Sat  04/13/2024, Normal, Last Dose: Not Recorded Refills: 1 ordered      Pharmacy: [facility] - [place], [place] - [address] Taking Not Taking Unknown Today Yesterday buPROPion (WELLBUTRIN XL) 150 MG 24 hr tablet Take 1 tablet (150 mg total) by mouth every morning., Starting Thu 02/16/2023, Until Fri 06/16/2023, Normal, Last Dose: Not Recorded Refills: 0 ordered      Pharmacy: [facility] - [place], [place] - [address] Taking Not Taking Unknown Today Yesterday busPIRone (BUSPAR) 15 MG tablet Take 1 tablet (15 mg total) by mouth 3 (three) times daily., Starting Thu 02/16/2023, Normal, Last Dose: Not Recorded Refills: 2 ordered      Pharmacy: [facility] - [place], [place] - [address] Taking Not Taking Unknown Today Yesterday Calcium Carbonate-Vitamin D (CALCIUM 600+D PO) Take 1 tablet by mouth daily. Informant: Self, Last Dose: Not Recorded Taking Not Taking Unknown Today Yesterday carvedilol (COREG) 6.25 MG tablet TAKE 1 TABLET BY MOUTH 2 TIMES DAILY WITH A MEAL., Starting Thu 12/15/2022, Normal, Last Dose: Not Recorded Refills: 1 ordered      Pharmacy: [facility] - [place], [place] - [address] Taking Not Taking Unknown Today Yesterday fluticasone (FLONASE) 50 MCG/ACT nasal spray Place 2 sprays into both nostrils daily as needed for allergies. Informant: Self, Last Dose: Not Recorded Taking Not Taking Unknown Today Yesterday fluticasone (FLOVENT HFA) 110 MCG/ACT inhaler Starting 11/03/2021, Last Dose: Not Recorded  Received from: [facility] Taking Not Taking Unknown Today Yesterday folic acid (FOLVITE) 1 MG tablet TAKE 1 TABLET BY MOUTH DAILY., Starting Mon 04/03/2023, Normal, Last Dose: Not Recorded Refills: 2 ordered      Pharmacy: [facility] - [place], [place] - [address] Taking Not Taking Unknown Today Yesterday gabapentin (NEURONTIN) 300 MG capsule TAKE 1 CAPSULE BY MOUTH 2 TIMES DAILY.,  Starting Mon 04/03/2023, Normal, Last Dose: Not Recorded Refills: 3 ordered      Pharmacy: [facility] - [place], [place] - [address] Taking Not Taking Unknown Today Yesterday hydrOXYzine (ATARAX) 25 MG tablet Take 25 mg by mouth 3 (three) times daily. Starting 01/18/2023, Last Dose: Not Recorded Received from: External Pharmacy Taking Not Taking Unknown Today Yesterday lidocaine 4 % Place 1 patch onto the skin daily., Starting Tue 02/07/2023, Normal, Last Dose: Not Recorded Refills: 0 ordered      Pharmacy: [facility] - [place], [place] - [address] Taking Not Taking Unknown Today Yesterday meloxicam (MOBIC) 15 MG tablet Take 1 tablet (15 mg total) by mouth daily as needed for pain., Starting Tue 08/03/2021, Normal, Last Dose: Not Recorded Refills: 1 ordered      Pharmacy: [facility] - [place], [place] - [address] Taking Not Taking Unknown Today Yesterday methocarbamol (ROBAXIN) 750 MG tablet Take 750 mg by mouth at bedtime as needed. Starting 02/15/2023, Last Dose: Not Recorded Received from: External Pharmacy Taking Not Taking Unknown Today Yesterday Multiple Vitamins-Minerals (ADULT GUMMY PO) Take 2 capsules by mouth daily. Informant: Self, Last Dose: Not Recorded Taking Not Taking Unknown Today Yesterday norethindrone-ethinyl estradiol (FEMHRT 1/5) 1-5 MG-MCG TABS tablet Take 1 tablet by mouth daily. Informant: Self, Last Dose: Not Recorded Taking Not Taking Unknown Today Yesterday oxyCODONE-acetaminophen (PERCOCET/ROXICET) 5-325 MG tablet Take 1 tablet by mouth every 6 (six) hours as needed for severe pain (pain score 7-10)., Starting Tue 02/07/2023, Normal, Last Dose: Not Recorded Refills: 0 ordered      Pharmacy: [facility] - [place], [place] - [address] Taking Not Taking Unknown Today Yesterday pantoprazole (PROTONIX) 40 MG tablet TAKE 1 TABLET BY MOUTH 2 TIMES DAILY., Starting Mon 05/08/2023, Normal, Last  Dose: Not Recorded Refills: 1 ordered      Pharmacy: [facility] - [place], [place] - [address] Taking Not Taking Unknown Today Yesterday propranolol (INDERAL) 10 MG tablet Take 1-2 tablets at onset of panic no more than 2 times daily, Normal, Last Dose: Not Recorded Refills: 2 ordered      Pharmacy: [facility] - [place], [place] - [address] Taking Not Taking Unknown Today Yesterday SUMAtriptan (IMITREX) 100 MG tablet TAKE 1 TABLET (100 MG TOTAL) BY MOUTH AS NEEDED FOR MIGRAINE. MAY REPEAT IN 2 HOURS IF HEADACHE PERSISTS OR RECURS., Normal, Last Dose: Not Recorded Refills: 1 ordered      Pharmacy: [facility] - [place], [place] - [address] Taking Not Taking Unknown Today Yesterday traZODone (DESYREL) 50 MG tablet Take 1 tablet (50 mg total) by mouth at bedtime., Starting Thu 02/16/2023, Until Wed 05/17/2023, Normal, Last Dose: Not Recorded Refills: 0 ordered      Pharmacy: [facility] - [place], [place] - [address] (Expired on 05/17/2023) (Edit Last Dose) tretinoin (RETIN-A) 0.025 % cream Received from: [facility] Taking Not Taking Unknown Today Yesterday   ROS Review of Systems  Constitutional:  Positive for activity change (return to use)  HENT:  Negative for congestion, postnasal drip, rhinorrhea, sinus pressure, sinus pain, sneezing, sore throat, tinnitus, trouble swallowing and voice change.   Eyes:  Negative for photophobia, pain, discharge, redness, itching and visual disturbance.  Respiratory:  Negative for apnea, cough, choking, chest tightness, shortness of breath, wheezing and stridor.   Cardiovascular:  Negative for chest pain, palpitations and leg swelling.  Gastrointestinal:  Negative for abdominal distention, abdominal pain, anal bleeding, constipation, diarrhea, nausea, rectal pain and vomiting.  Endocrine: Negative for cold intolerance, heat intolerance, polydipsia, polyphagia and polyuria.   Musculoskeletal:  Positive for back pain (chronic) and gait problem. Negative for joint swelling, myalgias, neck pain and neck stiffness.  Skin:  Positive for color change and rash. Negative for pallor and wound.       Rosacea  Allergic/Immunologic: Negative for environmental allergies, food allergies and immunocompromised state.  Neurological:  Positive for headaches (Hx Migraines Alcoholic PN ). Negative for tremors, seizures, syncope, speech difficulty, weakness, light-headedness and numbness.  Psychiatric/Behavioral:  Positive for sleep disturbance (Rx Trazodone). Negative for hallucinations, self-injury and suicidal ideas. The patient is dysphoric/appears subdued(?intoxicated). The patient is not hyperactive.     Objective:     There were no vitals taken for this visit. Most Recent Value 05/01/2023 1015     BP: 112/74  as of 05/01/2023 112/74    Pulse Rate: 106 Abnormal   as of 05/01/2023 106 Abnormal     Body Mass Index: None     Height: 5\' 2"  (1.575 m)  as of 05/01/2023 5\' 2"  (1.575 m)    Weight: 127 lb (57.6 kg)  as of 05/01/2023 127 lb (57.6 kg)    Resp: 18  as of 02/07/2023 --    SpO2: 100%  as of 05/01/2023 100 %    Temp: 98.5 F (36.9 C)  as of 02/07/2023       Physical Exam General Appearance: Fairly Groomed and Guarded  Eye Contact:  Poor  Speech:  Clear and Coherent and Slow  Volume:  Decreased  Mood:  Dysphoric  Affect:  Blunt  Thought Process:  Coherent, Goal Directed, and Descriptions of Associations: Intact  Orientation:  Other:  NOT TESTED  Thought Content:  WDL and Logical  Suicidal Thoughts:  No  Homicidal Thoughts:  No  Memory:  Trauma informed   Judgement:  Impaired  Insight:  Shallow  Psychomotor Activity:  Negative  Concentration:  Concentration: Fair and Attention Span: Fair  Recall:  Fair  Fund of Knowledge WDL  Language: Good  Akathisia:  NA  Handed:  Right  AIMS (if indicated):  NA  Assets:  Desire for Improvement Financial  Resources/Insurance Housing Resilience Social Support Talents/Skills Vocational/Educational  ADL's:  Impaired  Cognition: Impaired,  Mild and Moderate (ETOH)  Sleep:  Requires meds      Assessment & Plan:  Assessment: Alcohol Use Disorder Severe Dependence-relapse requires medically supervised withdrawal      and Plan:  Pt agrees to contact PCP for admission to [facility] detox unit Pt to have hospital contact Alene Mires LCSW [facility] CD IOP Counselor for consideration of aftercare/return to [facility] CD IOP    [name], PA-C

## 2023-06-15 ENCOUNTER — Encounter (HOSPITAL_COMMUNITY): Payer: Self-pay | Admitting: Medical

## 2023-06-15 ENCOUNTER — Telehealth (HOSPITAL_BASED_OUTPATIENT_CLINIC_OR_DEPARTMENT_OTHER): Payer: Self-pay | Admitting: Medical

## 2023-06-15 ENCOUNTER — Telehealth (HOSPITAL_COMMUNITY): Payer: Self-pay | Admitting: Medical

## 2023-06-15 DIAGNOSIS — Z79899 Other long term (current) drug therapy: Secondary | ICD-10-CM

## 2023-06-15 DIAGNOSIS — E8889 Other specified metabolic disorders: Secondary | ICD-10-CM

## 2023-06-15 DIAGNOSIS — Z62819 Personal history of unspecified abuse in childhood: Secondary | ICD-10-CM

## 2023-06-15 DIAGNOSIS — L718 Other rosacea: Secondary | ICD-10-CM

## 2023-06-15 DIAGNOSIS — F1021 Alcohol dependence, in remission: Secondary | ICD-10-CM

## 2023-06-15 DIAGNOSIS — Z8659 Personal history of other mental and behavioral disorders: Secondary | ICD-10-CM

## 2023-06-15 DIAGNOSIS — K709 Alcoholic liver disease, unspecified: Secondary | ICD-10-CM

## 2023-06-15 DIAGNOSIS — Z8781 Personal history of (healed) traumatic fracture: Secondary | ICD-10-CM

## 2023-06-15 DIAGNOSIS — Z9889 Other specified postprocedural states: Secondary | ICD-10-CM

## 2023-06-15 DIAGNOSIS — F1291 Cannabis use, unspecified, in remission: Secondary | ICD-10-CM

## 2023-06-15 DIAGNOSIS — Z8669 Personal history of other diseases of the nervous system and sense organs: Secondary | ICD-10-CM

## 2023-06-15 DIAGNOSIS — F4312 Post-traumatic stress disorder, chronic: Secondary | ICD-10-CM

## 2023-06-15 NOTE — Telephone Encounter (Signed)
 Patient ID: Terri Ayala, female   DOB: [birth year], 68 y.o.   MRN: [id number]   Attempted to reach patient by phone after no response to My Chart video appointment invitation for FU on last visit regarding relapse into alcohol. Reviewed chart but could find no updates regarding discussion of Detox at that visit. Left message for her to call [phone] and schedule FU

## 2023-06-15 NOTE — Progress Notes (Signed)
 Patient ID: Terri Ayala, female   DOB: 06-22-1955, 68 y.o.   MRN: [id number]  Pt failed to connect after multiple invitations from My Chart video. Phone message left for her to call and reschedule

## 2023-06-16 ENCOUNTER — Other Ambulatory Visit (HOSPITAL_COMMUNITY): Payer: Self-pay | Admitting: Medical

## 2023-06-16 NOTE — Telephone Encounter (Signed)
 Pt needs appointment

## 2023-06-26 ENCOUNTER — Other Ambulatory Visit: Payer: Self-pay | Admitting: Family Medicine

## 2023-06-26 DIAGNOSIS — F1020 Alcohol dependence, uncomplicated: Secondary | ICD-10-CM

## 2023-06-29 ENCOUNTER — Encounter (HOSPITAL_COMMUNITY): Payer: Self-pay | Admitting: Medical

## 2023-06-29 ENCOUNTER — Encounter (HOSPITAL_COMMUNITY): Payer: Self-pay

## 2023-06-29 ENCOUNTER — Telehealth (HOSPITAL_BASED_OUTPATIENT_CLINIC_OR_DEPARTMENT_OTHER): Payer: Self-pay | Admitting: Medical

## 2023-06-29 DIAGNOSIS — F1291 Cannabis use, unspecified, in remission: Secondary | ICD-10-CM

## 2023-06-29 DIAGNOSIS — E8889 Other specified metabolic disorders: Secondary | ICD-10-CM

## 2023-06-29 DIAGNOSIS — Z8659 Personal history of other mental and behavioral disorders: Secondary | ICD-10-CM

## 2023-06-29 DIAGNOSIS — L718 Other rosacea: Secondary | ICD-10-CM

## 2023-06-29 DIAGNOSIS — K709 Alcoholic liver disease, unspecified: Secondary | ICD-10-CM

## 2023-06-29 DIAGNOSIS — Z8781 Personal history of (healed) traumatic fracture: Secondary | ICD-10-CM

## 2023-06-29 DIAGNOSIS — F1021 Alcohol dependence, in remission: Secondary | ICD-10-CM

## 2023-06-29 DIAGNOSIS — Z79899 Other long term (current) drug therapy: Secondary | ICD-10-CM

## 2023-06-29 DIAGNOSIS — Z62819 Personal history of unspecified abuse in childhood: Secondary | ICD-10-CM

## 2023-06-29 DIAGNOSIS — Z8669 Personal history of other diseases of the nervous system and sense organs: Secondary | ICD-10-CM

## 2023-06-29 DIAGNOSIS — Z9889 Other specified postprocedural states: Secondary | ICD-10-CM

## 2023-06-29 DIAGNOSIS — F4312 Post-traumatic stress disorder, chronic: Secondary | ICD-10-CM

## 2023-06-29 NOTE — Progress Notes (Signed)
 Patient ID: Terri Ayala, female   DOB: 09-Aug-1955, 68 y.o.   MRN: [id number]  My Chart visit NO SHOW 2 invites sent

## 2023-07-06 ENCOUNTER — Other Ambulatory Visit (HOSPITAL_COMMUNITY): Payer: Self-pay | Admitting: *Deleted

## 2023-07-06 ENCOUNTER — Telehealth (HOSPITAL_COMMUNITY): Payer: Self-pay | Admitting: *Deleted

## 2023-07-06 MED ORDER — BUPROPION HCL ER (XL) 150 MG PO TB24: 150.0000 mg | ORAL_TABLET | ORAL | 0 refills | Status: DC

## 2023-07-06 MED ORDER — BACLOFEN 10 MG PO TABS: 10.0000 mg | ORAL_TABLET | Freq: Three times a day (TID) | ORAL | 0 refills | Status: DC

## 2023-07-06 NOTE — Telephone Encounter (Signed)
 Pt called requesting refills of both the Baclofen  and Wellbutrin . Pt has f/u scheduled for 07/13/23. No show last two scheduled appointments. Ok to bridge?

## 2023-07-13 ENCOUNTER — Encounter (HOSPITAL_COMMUNITY): Payer: Self-pay | Admitting: Medical

## 2023-07-13 ENCOUNTER — Telehealth (HOSPITAL_BASED_OUTPATIENT_CLINIC_OR_DEPARTMENT_OTHER): Payer: Self-pay | Admitting: Medical

## 2023-07-13 DIAGNOSIS — Z8659 Personal history of other mental and behavioral disorders: Secondary | ICD-10-CM

## 2023-07-13 DIAGNOSIS — F1021 Alcohol dependence, in remission: Secondary | ICD-10-CM

## 2023-07-13 DIAGNOSIS — F1291 Cannabis use, unspecified, in remission: Secondary | ICD-10-CM

## 2023-07-13 DIAGNOSIS — Z62819 Personal history of unspecified abuse in childhood: Secondary | ICD-10-CM

## 2023-07-13 DIAGNOSIS — Z8781 Personal history of (healed) traumatic fracture: Secondary | ICD-10-CM

## 2023-07-13 DIAGNOSIS — Z8669 Personal history of other diseases of the nervous system and sense organs: Secondary | ICD-10-CM

## 2023-07-13 DIAGNOSIS — F4312 Post-traumatic stress disorder, chronic: Secondary | ICD-10-CM

## 2023-07-13 DIAGNOSIS — L718 Other rosacea: Secondary | ICD-10-CM

## 2023-07-13 DIAGNOSIS — Z79899 Other long term (current) drug therapy: Secondary | ICD-10-CM

## 2023-07-13 DIAGNOSIS — Z9889 Other specified postprocedural states: Secondary | ICD-10-CM

## 2023-07-13 DIAGNOSIS — K709 Alcoholic liver disease, unspecified: Secondary | ICD-10-CM

## 2023-07-13 DIAGNOSIS — E8889 Other specified metabolic disorders: Secondary | ICD-10-CM

## 2023-07-13 NOTE — Progress Notes (Signed)
 Patient ID: Terri Ayala, female   DOB: Mar 19, 1955, 68 y.o.   MRN: [id number] No show

## 2023-07-24 ENCOUNTER — Emergency Department (HOSPITAL_COMMUNITY)

## 2023-07-24 ENCOUNTER — Other Ambulatory Visit: Payer: Self-pay

## 2023-07-24 ENCOUNTER — Inpatient Hospital Stay (HOSPITAL_COMMUNITY)
Admission: EM | Admit: 2023-07-24 | Discharge: 2023-08-07 | DRG: 897 | Disposition: A | Discharge status: Skilled Nursing Facility | Attending: Internal Medicine | Admitting: Internal Medicine

## 2023-07-24 ENCOUNTER — Encounter (HOSPITAL_COMMUNITY): Payer: Self-pay

## 2023-07-24 DIAGNOSIS — K760 Fatty (change of) liver, not elsewhere classified: Secondary | ICD-10-CM | POA: Diagnosis present

## 2023-07-24 DIAGNOSIS — Z87891 Personal history of nicotine dependence: Secondary | ICD-10-CM | POA: Diagnosis not present

## 2023-07-24 DIAGNOSIS — Y92008 Other place in unspecified non-institutional (private) residence as the place of occurrence of the external cause: Secondary | ICD-10-CM

## 2023-07-24 DIAGNOSIS — F10930 Alcohol use, unspecified with withdrawal, uncomplicated: Secondary | ICD-10-CM | POA: Diagnosis not present

## 2023-07-24 DIAGNOSIS — Z885 Allergy status to narcotic agent status: Secondary | ICD-10-CM | POA: Diagnosis not present

## 2023-07-24 DIAGNOSIS — E43 Unspecified severe protein-calorie malnutrition: Secondary | ICD-10-CM | POA: Diagnosis not present

## 2023-07-24 DIAGNOSIS — I10 Essential (primary) hypertension: Secondary | ICD-10-CM | POA: Diagnosis present

## 2023-07-24 DIAGNOSIS — R5381 Other malaise: Secondary | ICD-10-CM | POA: Diagnosis present

## 2023-07-24 DIAGNOSIS — R52 Pain, unspecified: Secondary | ICD-10-CM

## 2023-07-24 DIAGNOSIS — I081 Rheumatic disorders of both mitral and tricuspid valves: Secondary | ICD-10-CM | POA: Diagnosis present

## 2023-07-24 DIAGNOSIS — Z1152 Encounter for screening for COVID-19: Secondary | ICD-10-CM

## 2023-07-24 DIAGNOSIS — G312 Degeneration of nervous system due to alcohol: Secondary | ICD-10-CM | POA: Diagnosis present

## 2023-07-24 DIAGNOSIS — F10939 Alcohol use, unspecified with withdrawal, unspecified: Secondary | ICD-10-CM | POA: Diagnosis present

## 2023-07-24 DIAGNOSIS — F10239 Alcohol dependence with withdrawal, unspecified: Secondary | ICD-10-CM | POA: Diagnosis present

## 2023-07-24 DIAGNOSIS — E876 Hypokalemia: Secondary | ICD-10-CM | POA: Diagnosis present

## 2023-07-24 DIAGNOSIS — Z881 Allergy status to other antibiotic agents status: Secondary | ICD-10-CM

## 2023-07-24 DIAGNOSIS — R627 Adult failure to thrive: Secondary | ICD-10-CM | POA: Diagnosis present

## 2023-07-24 DIAGNOSIS — E785 Hyperlipidemia, unspecified: Secondary | ICD-10-CM | POA: Diagnosis present

## 2023-07-24 DIAGNOSIS — Z800 Family history of malignant neoplasm of digestive organs: Secondary | ICD-10-CM | POA: Diagnosis not present

## 2023-07-24 DIAGNOSIS — K219 Gastro-esophageal reflux disease without esophagitis: Secondary | ICD-10-CM | POA: Diagnosis present

## 2023-07-24 DIAGNOSIS — R9431 Abnormal electrocardiogram [ECG] [EKG]: Secondary | ICD-10-CM | POA: Diagnosis not present

## 2023-07-24 DIAGNOSIS — G621 Alcoholic polyneuropathy: Secondary | ICD-10-CM | POA: Diagnosis present

## 2023-07-24 DIAGNOSIS — S2232XA Fracture of one rib, left side, initial encounter for closed fracture: Secondary | ICD-10-CM | POA: Diagnosis present

## 2023-07-24 DIAGNOSIS — Z7983 Long term (current) use of bisphosphonates: Secondary | ICD-10-CM | POA: Diagnosis not present

## 2023-07-24 DIAGNOSIS — W108XXA Fall (on) (from) other stairs and steps, initial encounter: Secondary | ICD-10-CM | POA: Diagnosis present

## 2023-07-24 DIAGNOSIS — R7989 Other specified abnormal findings of blood chemistry: Secondary | ICD-10-CM | POA: Diagnosis present

## 2023-07-24 DIAGNOSIS — Z6821 Body mass index (BMI) 21.0-21.9, adult: Secondary | ICD-10-CM

## 2023-07-24 DIAGNOSIS — Z961 Presence of intraocular lens: Secondary | ICD-10-CM | POA: Diagnosis present

## 2023-07-24 DIAGNOSIS — F4310 Post-traumatic stress disorder, unspecified: Secondary | ICD-10-CM | POA: Diagnosis present

## 2023-07-24 DIAGNOSIS — Z79899 Other long term (current) drug therapy: Secondary | ICD-10-CM | POA: Diagnosis not present

## 2023-07-24 DIAGNOSIS — E440 Moderate protein-calorie malnutrition: Secondary | ICD-10-CM | POA: Diagnosis present

## 2023-07-24 DIAGNOSIS — R4182 Altered mental status, unspecified: Secondary | ICD-10-CM | POA: Diagnosis present

## 2023-07-24 DIAGNOSIS — F419 Anxiety disorder, unspecified: Secondary | ICD-10-CM | POA: Diagnosis present

## 2023-07-24 DIAGNOSIS — S2232XD Fracture of one rib, left side, subsequent encounter for fracture with routine healing: Secondary | ICD-10-CM

## 2023-07-24 DIAGNOSIS — R011 Cardiac murmur, unspecified: Secondary | ICD-10-CM | POA: Diagnosis not present

## 2023-07-24 DIAGNOSIS — F32A Depression, unspecified: Secondary | ICD-10-CM | POA: Diagnosis present

## 2023-07-24 DIAGNOSIS — R569 Unspecified convulsions: Secondary | ICD-10-CM | POA: Diagnosis not present

## 2023-07-24 LAB — CBC WITH DIFFERENTIAL/PLATELET
Abs Immature Granulocytes: 0.08 10*3/uL — ABNORMAL HIGH (ref 0.00–0.07)
Basophils Absolute: 0.1 10*3/uL (ref 0.0–0.1)
Basophils Relative: 1 %
Eosinophils Absolute: 0.1 10*3/uL (ref 0.0–0.5)
Eosinophils Relative: 1 %
HCT: 34.9 % — ABNORMAL LOW (ref 36.0–46.0)
Hemoglobin: 11.6 g/dL — ABNORMAL LOW (ref 12.0–15.0)
Immature Granulocytes: 1 %
Lymphocytes Relative: 13 %
Lymphs Abs: 1.1 10*3/uL (ref 0.7–4.0)
MCH: 30.7 pg (ref 26.0–34.0)
MCHC: 33.2 g/dL (ref 30.0–36.0)
MCV: 92.3 fL (ref 80.0–100.0)
Monocytes Absolute: 1 10*3/uL (ref 0.1–1.0)
Monocytes Relative: 11 %
Neutro Abs: 6.6 10*3/uL (ref 1.7–7.7)
Neutrophils Relative %: 73 %
Platelets: 178 10*3/uL (ref 150–400)
RBC: 3.78 MIL/uL — ABNORMAL LOW (ref 3.87–5.11)
RDW: 14.4 % (ref 11.5–15.5)
WBC: 8.8 10*3/uL (ref 4.0–10.5)
nRBC: 0 % (ref 0.0–0.2)

## 2023-07-24 LAB — I-STAT CHEM 8, ED
BUN: 16 mg/dL (ref 8–23)
Calcium, Ion: 1.06 mmol/L — ABNORMAL LOW (ref 1.15–1.40)
Chloride: 102 mmol/L (ref 98–111)
Creatinine, Ser: 0.9 mg/dL (ref 0.44–1.00)
Glucose, Bld: 104 mg/dL — ABNORMAL HIGH (ref 70–99)
HCT: 37 % (ref 36.0–46.0)
Hemoglobin: 12.6 g/dL (ref 12.0–15.0)
Potassium: 3.8 mmol/L (ref 3.5–5.1)
Sodium: 137 mmol/L (ref 135–145)
TCO2: 22 mmol/L (ref 22–32)

## 2023-07-24 LAB — BASIC METABOLIC PANEL WITH GFR
Anion gap: 12 (ref 5–15)
BUN: 15 mg/dL (ref 8–23)
CO2: 21 mmol/L — ABNORMAL LOW (ref 22–32)
Calcium: 8.3 mg/dL — ABNORMAL LOW (ref 8.9–10.3)
Chloride: 102 mmol/L (ref 98–111)
Creatinine, Ser: 0.77 mg/dL (ref 0.44–1.00)
GFR, Estimated: >60 mL/min (ref >60)
Glucose, Bld: 106 mg/dL — ABNORMAL HIGH (ref 70–99)
Potassium: 4.1 mmol/L (ref 3.5–5.1)
Sodium: 135 mmol/L (ref 135–145)

## 2023-07-24 LAB — AMMONIA: Ammonia: 25 umol/L (ref 9–35)

## 2023-07-24 LAB — TSH: TSH: 6.915 u[IU]/mL — ABNORMAL HIGH (ref 0.350–4.500)

## 2023-07-24 LAB — RETICULOCYTES
Immature Retic Fract: 8.8 % (ref 2.3–15.9)
RBC.: 3.73 MIL/uL — ABNORMAL LOW (ref 3.87–5.11)
Retic Count, Absolute: 99.6 10*3/uL (ref 19.0–186.0)
Retic Ct Pct: 2.7 % (ref 0.4–3.1)

## 2023-07-24 LAB — PROTIME-INR
INR: 1 (ref 0.8–1.2)
Prothrombin Time: 13.1 s (ref 11.4–15.2)

## 2023-07-24 MED ORDER — ACETAMINOPHEN 650 MG RE SUPP: 650.0000 mg | Freq: Four times a day (QID) | RECTAL | Status: DC | PRN

## 2023-07-24 MED ORDER — HYDROMORPHONE HCL 1 MG/ML IJ SOLN
2.0000 mg | Freq: Once | INTRAMUSCULAR | Status: AC
  Administered 2023-07-24: 2 mg via INTRAVENOUS
  Filled 2023-07-24: qty 2

## 2023-07-24 MED ORDER — ADULT MULTIVITAMIN W/MINERALS CH
1.0000 | ORAL_TABLET | Freq: Every day | ORAL | Status: DC
  Administered 2023-07-24 – 2023-08-07 (×15): 1 via ORAL
  Filled 2023-07-24 (×15): qty 1

## 2023-07-24 MED ORDER — OXYCODONE HCL 5 MG PO TABS: 5.0000 mg | ORAL_TABLET | ORAL | 0 refills | Status: DC | PRN

## 2023-07-24 MED ORDER — ONDANSETRON HCL 4 MG/2ML IJ SOLN
4.0000 mg | Freq: Four times a day (QID) | INTRAMUSCULAR | Status: DC | PRN
  Administered 2023-07-24: 4 mg via INTRAVENOUS
  Filled 2023-07-24 (×2): qty 2

## 2023-07-24 MED ORDER — ACETAMINOPHEN 325 MG PO TABS
650.0000 mg | ORAL_TABLET | Freq: Four times a day (QID) | ORAL | Status: DC | PRN
  Administered 2023-07-27 – 2023-08-07 (×3): 650 mg via ORAL
  Filled 2023-07-24 (×3): qty 2

## 2023-07-24 MED ORDER — HYDROCODONE-ACETAMINOPHEN 5-325 MG PO TABS
1.0000 | ORAL_TABLET | ORAL | Status: DC | PRN
  Administered 2023-07-25 (×2): 1 via ORAL
  Administered 2023-07-25 – 2023-07-26 (×3): 2 via ORAL
  Administered 2023-07-27 (×2): 1 via ORAL
  Administered 2023-07-27 – 2023-07-28 (×6): 2 via ORAL
  Filled 2023-07-24: qty 1
  Filled 2023-07-24 (×4): qty 2
  Filled 2023-07-24: qty 1
  Filled 2023-07-24 (×5): qty 2
  Filled 2023-07-24 (×2): qty 1

## 2023-07-24 MED ORDER — THIAMINE MONONITRATE 100 MG PO TABS
100.0000 mg | ORAL_TABLET | Freq: Every day | ORAL | Status: DC
  Administered 2023-07-24 – 2023-08-07 (×15): 100 mg via ORAL
  Filled 2023-07-24 (×15): qty 1

## 2023-07-24 MED ORDER — SODIUM CHLORIDE 0.9 % IV SOLN: INTRAVENOUS | Status: AC

## 2023-07-24 MED ORDER — LORAZEPAM 1 MG PO TABS
1.0000 mg | ORAL_TABLET | Freq: Once | ORAL | Status: AC
  Administered 2023-07-24: 1 mg via ORAL
  Filled 2023-07-24: qty 1

## 2023-07-24 MED ORDER — FENTANYL CITRATE PF 50 MCG/ML IJ SOSY
50.0000 ug | PREFILLED_SYRINGE | Freq: Once | INTRAMUSCULAR | Status: AC
  Administered 2023-07-24: 50 ug via INTRAVENOUS
  Filled 2023-07-24: qty 1

## 2023-07-24 MED ORDER — IOHEXOL 350 MG/ML SOLN
75.0000 mL | Freq: Once | INTRAVENOUS | Status: AC | PRN
  Administered 2023-07-24: 75 mL via INTRAVENOUS

## 2023-07-24 MED ORDER — THIAMINE HCL 100 MG/ML IJ SOLN
100.0000 mg | Freq: Every day | INTRAMUSCULAR | Status: DC
  Filled 2023-07-24: qty 2

## 2023-07-24 MED ORDER — LORAZEPAM 2 MG/ML IJ SOLN
1.0000 mg | INTRAMUSCULAR | Status: AC | PRN
  Administered 2023-07-25 (×4): 2 mg via INTRAVENOUS
  Administered 2023-07-25 – 2023-07-26 (×2): 1 mg via INTRAVENOUS
  Administered 2023-07-26: 3 mg via INTRAVENOUS
  Administered 2023-07-26 – 2023-07-27 (×5): 2 mg via INTRAVENOUS
  Filled 2023-07-24 (×6): qty 1
  Filled 2023-07-24: qty 2
  Filled 2023-07-24 (×5): qty 1

## 2023-07-24 MED ORDER — LORAZEPAM 1 MG PO TABS
1.0000 mg | ORAL_TABLET | ORAL | Status: AC | PRN
  Administered 2023-07-24 – 2023-07-25 (×2): 2 mg via ORAL
  Administered 2023-07-25: 1 mg via ORAL
  Administered 2023-07-26: 2 mg via ORAL
  Administered 2023-07-27: 1 mg via ORAL
  Filled 2023-07-24 (×2): qty 1
  Filled 2023-07-24 (×3): qty 2

## 2023-07-24 MED ORDER — HYDROMORPHONE HCL 1 MG/ML IJ SOLN
1.0000 mg | Freq: Once | INTRAMUSCULAR | Status: AC
  Administered 2023-07-24: 1 mg via INTRAMUSCULAR
  Filled 2023-07-24: qty 1

## 2023-07-24 MED ORDER — MORPHINE SULFATE (PF) 4 MG/ML IV SOLN
4.0000 mg | Freq: Once | INTRAVENOUS | Status: AC
  Administered 2023-07-24: 4 mg via INTRAVENOUS
  Filled 2023-07-24 (×2): qty 1

## 2023-07-24 MED ORDER — FOLIC ACID 1 MG PO TABS
1.0000 mg | ORAL_TABLET | Freq: Every day | ORAL | Status: DC
  Administered 2023-07-24 – 2023-08-07 (×15): 1 mg via ORAL
  Filled 2023-07-24 (×15): qty 1

## 2023-07-24 MED ORDER — LORAZEPAM 2 MG/ML IJ SOLN
1.0000 mg | Freq: Once | INTRAMUSCULAR | Status: AC
  Administered 2023-07-24: 1 mg via INTRAVENOUS
  Filled 2023-07-24: qty 1

## 2023-07-24 MED ORDER — FENTANYL CITRATE PF 50 MCG/ML IJ SOSY
25.0000 ug | PREFILLED_SYRINGE | INTRAMUSCULAR | Status: DC | PRN
  Administered 2023-07-25 – 2023-07-27 (×4): 25 ug via INTRAVENOUS
  Filled 2023-07-24 (×4): qty 1

## 2023-07-24 NOTE — ED Notes (Signed)
 Patient transported to CT

## 2023-07-24 NOTE — Assessment & Plan Note (Signed)
 In the setting of alcohol  abuse

## 2023-07-24 NOTE — Assessment & Plan Note (Signed)
 Continue Protonix 40 mg daily

## 2023-07-24 NOTE — Assessment & Plan Note (Signed)
Check t3 and t4

## 2023-07-24 NOTE — ED Provider Notes (Signed)
 Patient's nurse came up to me stating that patient is unable to get to her wheelchair for discharge.  States that the pain is so severe she is unable to move. Physical Exam  BP 130/80   Pulse 100   Temp 98.3 F (36.8 C) (Oral)   Resp 16   Ht 5\' 2"  (1.575 m)   Wt 54.4 kg   SpO2 100%   BMI 21.95 kg/m   Physical Exam  Procedures  Procedures  ED Course / MDM    Medical Decision Making Amount and/or Complexity of Data Reviewed Labs: ordered. Radiology: ordered.  Risk Prescription drug management. Decision regarding hospitalization.   Spoke with patient who states that the fentanyl  and the morphine  given for the pain has not helped.  Since she has a displaced rib fracture and she is in significant mount of pain.  Will admit to the hospital.  1 mg Dilaudid  given without improvement of symptoms.  On reevaluation 2 mg of Dilaudid  were given and Ativan  was given for alcohol  withdrawals.  Last drink this morning, prior to the fall.  I consulted the hospitalist service and spoke with Dr. Hendrick Locke who agreed to meet the patient for further evaluation.  Patient is agreeable with this plan.   Sonnie Dusky, PA-C 07/24/23 1934    Merdis Stalling, MD 07/24/23 2045

## 2023-07-24 NOTE — Assessment & Plan Note (Signed)
 Left eighth rib displaced fracture pain management as needed

## 2023-07-24 NOTE — Assessment & Plan Note (Signed)
 Continue Neurontin

## 2023-07-24 NOTE — Assessment & Plan Note (Signed)
 Continue Coreg  6.25 mg p.o. twice daily

## 2023-07-24 NOTE — ED Notes (Signed)
 Pt not tol any movement d/t Lt sided rib fx pain. Pt gasping for breath & yelling with each attemt to get her ready for d/c. Her ride was called & has arrived to leave with her but pt cannot get out of bed. EDP Drury Geralds made aware she may need reassessed.

## 2023-07-24 NOTE — Assessment & Plan Note (Addendum)
 In the past repeat ECG the monitor on telemetry

## 2023-07-24 NOTE — ED Notes (Signed)
 CCMD called to admit the patient for cardiac monitoring.

## 2023-07-24 NOTE — H&P (Addendum)
 Terri Ayala:[id number] DOB: 08/13/1955 DOA: 07/24/2023     PCP: Noreene Bearded, PA   Outpatient Specialists:  CARDS:   Dr. Antoinette Batman, MD    Patient arrived to ER on 07/24/23 at 0958 Referred by Attending Selene Dais, MD   Patient coming from:    home Lives alone,     Chief Complaint:  Chief Complaint  Patient presents with   Fall    HPI: Terri Ayala is a 68 y.o. female with medical history significant of depression, anxiety, PTSD, alcohol  use, hypertension neuropathy migraines    Presented with fall Patient come from home with a fall down for 5 steps.  Patient has been drinking heavily since Christmas about 2 bottles of wine per day. Was under the influence during the fall.  Denies hitting her head but is a little unsure about that.  Denies LOC She is not on blood thinners She has hurt her left side of her rib cage. History of DTs in the past.  History of anxiety and depression. Last EtOH was this morning she is starting to feel firm somewhat tremulous and sweaty now and thinks that she is going through DTs     significant ETOH intake 2 bottles of wine per day last alcohol  drink was this morning positive history of DTs Does not smoke   Denies marijuana use      Regarding pertinent Chronic problems:      HTN on Coreg    last echo  Recent Results (from the past 95621 hours)  ECHOCARDIOGRAM COMPLETE   Collection Time: 09/27/20  2:42 PM  Result Value   Weight 2,080   Height 62   BP 135/78   S' Lateral 2.30   Narrative      ECHOCARDIOGRAM REPORT      1. Left ventricular ejection fraction, by estimation, is 65 to 70%. The left ventricle has normal function. The left ventricle has no regional wall motion abnormalities. Left ventricular diastolic parameters are indeterminate (difficult tissue Doppler  acquisition).  2. Right ventricular systolic function is hyperdynamic. The right ventricular size is normal. Tricuspid regurgitation signal  is inadequate for assessing PA pressure.  3. The mitral valve is normal in structure. No evidence of mitral valve regurgitation. No evidence of mitral stenosis.  4. The aortic valve was not well visualized. Aortic valve regurgitation is not visualized. No aortic stenosis is present.  5. The inferior vena cava is normal in size with greater than 50% respiratory variability, suggesting right atrial pressure of 3 mmHg.  Comparison(s): A prior study was performed on 05/05/2018. LV and RV function is more vigorous.          Chronic anemia - baseline hg Hemoglobin & Hematocrit  Recent Labs    07/24/23 1049 07/24/23 1055  HGB 11.6* 12.6     While in ER:     Trauma imaging showed acute posterior left eighth rib fracture and displaced Pain not controlled with Dilaudid  IV Patient started to have EtOH withdrawal symptoms    Lab Orders         CBC with Differential         Basic metabolic panel         HIV Antibody (routine testing w rflx)         Magnesium          Phosphorus         Comprehensive metabolic panel         CBC  Comprehensive metabolic panel         Magnesium          Phosphorus         Ammonia         CK         TSH         Protime-INR         Prealbumin         I-stat chem 8, ED (not at [facility], [facility] or [facility])     Right hip degenerative joint changes, no fracture  CT HEAD cervical neck   NON acute     CTabd/pelvis chest-acute posterior left left eighth rib fracture displaced remote healing fractures    Following Medications were ordered in ER: Medications  ondansetron  (ZOFRAN ) injection 4 mg (4 mg Intravenous Given 07/24/23 1121)  LORazepam  (ATIVAN ) tablet 1-4 mg (has no administration in time range)    Or  LORazepam  (ATIVAN ) injection 1-4 mg (has no administration in time range)  thiamine  (VITAMIN B1) tablet 100 mg (has no administration in time range)    Or  thiamine  (VITAMIN B1) injection 100 mg (has no administration in time range)  folic acid  (FOLVITE )  tablet 1 mg (has no administration in time range)  multivitamin with minerals tablet 1 tablet (has no administration in time range)  0.9 %  sodium chloride  infusion (has no administration in time range)  acetaminophen  (TYLENOL ) tablet 650 mg (has no administration in time range)    Or  acetaminophen  (TYLENOL ) suppository 650 mg (has no administration in time range)  HYDROcodone -acetaminophen  (NORCO/VICODIN) 5-325 MG per tablet 1-2 tablet (has no administration in time range)  morphine  (PF) 4 MG/ML injection 4 mg (4 mg Intravenous Given 07/24/23 1122)  LORazepam  (ATIVAN ) injection 1 mg (1 mg Intravenous Given 07/24/23 1201)  iohexol  (OMNIPAQUE ) 350 MG/ML injection 75 mL (75 mLs Intravenous Contrast Given 07/24/23 1245)  fentaNYL  (SUBLIMAZE ) injection 50 mcg (50 mcg Intravenous Given 07/24/23 1539)  HYDROmorphone  (DILAUDID ) injection 1 mg (1 mg Intramuscular Given 07/24/23 1720)  LORazepam  (ATIVAN ) tablet 1 mg (1 mg Oral Given 07/24/23 1906)  HYDROmorphone  (DILAUDID ) injection 2 mg (2 mg Intravenous Given 07/24/23 1907)       ED Triage Vitals  Encounter Vitals Group     BP 07/24/23 1015 113/82     Systolic BP Percentile --      Diastolic BP Percentile --      Pulse Rate 07/24/23 1015 89     Resp 07/24/23 1015 10     Temp 07/24/23 1005 98 F (36.7 C)     Temp Source 07/24/23 1005 Oral     SpO2 07/24/23 1015 100 %     Weight 07/24/23 1002 120 lb (54.4 kg)     Height 07/24/23 1002 5\' 2"  (1.575 m)     Head Circumference --      Peak Flow --      Pain Score 07/24/23 1001 10     Pain Loc --      Pain Education --      Exclude from Growth Chart --   JYNW(29)@     _________________________________________ Significant initial  Findings: Abnormal Labs Reviewed  CBC WITH DIFFERENTIAL/PLATELET - Abnormal; Notable for the following components:      Result Value   RBC 3.78 (*)    Hemoglobin 11.6 (*)    HCT 34.9 (*)    Abs Immature Granulocytes 0.08 (*)    All other components within normal  limits  BASIC METABOLIC  PANEL WITH GFR - Abnormal; Notable for the following components:   CO2 21 (*)    Glucose, Bld 106 (*)    Calcium  8.3 (*)    All other components within normal limits  I-STAT CHEM 8, ED - Abnormal; Notable for the following components:   Glucose, Bld 104 (*)    Calcium , Ion 1.06 (*)    All other components within normal limits     ECG: Ordered  Rate 102 sinus rhythm Low voltage, precordial leads Baseline wander in lead(s) II III aVF No acute ischemic changes QTc 441  The recent clinical data is shown below. Vitals:   07/24/23 1530 07/24/23 1600 07/24/23 1630 07/24/23 1908  BP: 125/78 130/80    Pulse: 99 (!) 106 100   Resp: 19 12 16    Temp:    98.2 F (36.8 C)  TempSrc:    Temporal  SpO2: 100% 100% 100%   Weight:      Height:         WBC     Component Value Date/Time   WBC 8.8 07/24/2023 1049   LYMPHSABS 1.1 07/24/2023 1049   LYMPHSABS 1.7 05/23/2022 0943   MONOABS 1.0 07/24/2023 1049   EOSABS 0.1 07/24/2023 1049   EOSABS 0.2 05/23/2022 0943   BASOSABS 0.1 07/24/2023 1049   BASOSABS 0.1 05/23/2022 0943      UA not ordered   Results for orders placed or performed during the hospital encounter of 02/15/22  MRSA Next Gen by PCR, Nasal     Status: None   Collection Time: 02/15/22 11:43 PM   Specimen: Nasal Mucosa; Nasal Swab  Result Value Ref Range Status   MRSA by PCR Next Gen NOT DETECTED NOT DETECTED Final    Comment: (NOTE) The GeneXpert MRSA Assay (FDA approved for NASAL specimens only), is one component of a comprehensive MRSA colonization surveillance program. It is not intended to diagnose MRSA infection nor to guide or monitor treatment for MRSA infections. Test performance is not FDA approved in patients less than 54 years old. Performed at [facility], [address] [place], [place] [address]       __________________________________________________________ Recent Labs  Lab 07/24/23 1049  07/24/23 1055  NA 135 137  K 4.1 3.8  CO2 21*  --   GLUCOSE 106* 104*  BUN 15 16  CREATININE 0.77 0.90  CALCIUM  8.3*  --     Cr   stable,    Lab Results  Component Value Date   CREATININE 0.90 07/24/2023   CREATININE 0.77 07/24/2023   CREATININE 0.95 05/23/2022    No results for input(s): "AST", "ALT", "ALKPHOS", "BILITOT", "PROT", "ALBUMIN" in the last 168 hours. Lab Results  Component Value Date   CALCIUM  8.3 (L) 07/24/2023   PHOS 2.7 02/17/2022       Plt: Lab Results  Component Value Date   PLT 178 07/24/2023       Recent Labs  Lab 07/24/23 1049 07/24/23 1055  WBC 8.8  --   NEUTROABS 6.6  --   HGB 11.6* 12.6  HCT 34.9* 37.0  MCV 92.3  --   PLT 178  --     HG/HCT stable     Component Value Date/Time   HGB 12.6 07/24/2023 1055   HGB 13.0 05/23/2022 0943   HCT 37.0 07/24/2023 1055   HCT 38.9 05/23/2022 0943   MCV 92.3 07/24/2023 1049   MCV 93 05/23/2022 0943     _______________________________________________ Hospitalist was called for  admission for   Closed fracture of one rib of left side, alcohol  withdrawal    The following Work up has been ordered so far:  Orders Placed This Encounter  Procedures   DG Pelvis Portable   CT HEAD WO CONTRAST   CT CHEST ABDOMEN PELVIS W CONTRAST   CT CERVICAL SPINE WO CONTRAST   CT T-SPINE NO CHARGE   CBC with Differential   Basic metabolic panel   HIV Antibody (routine testing w rflx)   Magnesium    Phosphorus   Comprehensive metabolic panel   CBC   Comprehensive metabolic panel   Magnesium    Phosphorus   Ammonia   CK   TSH   Protime-INR   Prealbumin   Diet Heart Room service appropriate? Yes; Fluid consistency: Thin   ED Cardiac monitoring   Measure blood pressure   Initiate Carrier Fluid Protocol   Clinical institute withdrawal assessment   Cardiac Monitoring - Continuous Indefinite   Clinical Institute Withdrawal Assessent (CIWA)   Notify Pharmacy to change IV Ativan  to PO if tolerating POs  well.   If Ativan  given, reassess Clinical Institute Withdrawal Assessment (CIWA) with blood pressure and pulse rate within 1 hour of Ativan  administration   Refer to Sidebar Report to reference: ETOH Withdrawal Guidelines   Cardiac monitoring   Notify physician (specify)   SCDs   Patient has an active order for admit to inpatient/place in observation   Vital signs   Notify physician (specify)   Mobility Protocol: No Restrictions RN to initiate protocols based on patient's level of care   Refer to Sidebar Report Refer to ICU, Med-Surg, Progressive, and Step-Down Mobility Protocol Sidebars   Initiate Adult Central Line Maintenance and Catheter Protocol for patients with central line (CVC, PICC, Port, Hemodialysis, Trialysis)   If patient diabetic or glucose greater than 140 notify physician for Sliding Scale Insulin  Orders   Do not place and if present remove PureWick   Oral care per nursing protocol   Initiate Oral Care Protocol   Initiate Carrier Fluid Protocol   Assess   Check Pulse Oximetry while ambulating   Place order for blood cultures x 2 (from different sites) for Temp > 101F   RN may order General Admission PRN Orders utilizing "General Admission PRN medications" (through manage orders) for the following patient needs: allergy symptoms (Claritin), cold sores (Carmex), cough (Robitussin DM), eye irritation (Liquifilm Tears), hemorrhoids (Tucks), indigestion (Maalox), minor skin irritation (Hydrocortisone  Cream), muscle pain Lovena Rubinstein Gay), nose irritation (saline nasal spray) and sore throat (Chloraseptic spray).   Full code   Consult for Unassigned Medical Admission   Consult to Transition of Care Team   Nutritional services consult   Consult to Transition of Care Team   May transport without cardiac monitor   OT eval and treat   PT eval and treat   ED Pulse oximetry, continuous   Incentive spirometry RT   Pulse oximetry check with vital signs   Oxygen therapy Mode or (Route):  Nasal cannula; Liters Per Minute: 2; Keep O2 saturation between: greater than 92 %   Incentive spirometry   Pulse oximetry, continuous   I-stat chem 8, ED (not at [facility], [facility] or [facility])   EKG 12-Lead   EKG 12-Lead   ECHOCARDIOGRAM COMPLETE   Saline lock IV   Admit to Inpatient (patient's expected length of stay will be greater than 2 midnights or inpatient only procedure)     OTHER Significant initial  Findings:  labs showing:  DM  labs:  HbA1C: No results for input(s): "HGBA1C" in the last 8760 hours.     CBG (last 3)  No results for input(s): "GLUCAP" in the last 72 hours.        Cultures:    Component Value Date/Time   SDES BLOOD RIGHT ARM 09/26/2020 1444   SPECREQUEST  09/26/2020 1444    BOTTLES DRAWN AEROBIC AND ANAEROBIC Blood Culture adequate volume   CULT  09/26/2020 1444    NO GROWTH 5 DAYS Performed at [facility], [address] [place], [place] [address]    REPTSTATUS 10/01/2020 FINAL 09/26/2020 1444     Radiological Exams on Admission: CT T-SPINE NO CHARGE Result Date: 07/24/2023 EXAM: CT THORACIC SPINE WITHOUT CONTRAST 07/24/2023 12:37:00 PM TECHNIQUE: CT of the thoracic spine was performed without the administration of intravenous contrast. Multiplanar reformatted images are provided for review. Automated exposure control, iterative reconstruction, and/or weight based adjustment of the mA/kV was utilized to reduce the radiation dose to as low as reasonably achievable. COMPARISON: None available. CLINICAL HISTORY: Head trauma, moderate-severe; Polytrauma, blunt. FINDINGS: BONES AND ALIGNMENT: There is normal alignment of the spine. The vertebral body heights are maintained. No osseous destructive lesion is seen. An acute fracture of the posterior left eighth rib is displaced 1.5 cm the shaft width. Remote right fifth through 10th rib fractures are present. A remote posteromedial left fifth rib fracture is present. DEGENERATIVE CHANGES: No gross spinal canal  stenosis or bony neural foraminal narrowing of the thoracic spine. SOFT TISSUES: No paraspinal mass or hematoma. LIMITED CHEST: Limited images of the chest demonstrate no pneumothorax. Adjacent pleural thickening is present. IMPRESSION: 1. Acute displaced fracture of the posterior left eighth rib with adjacent pleural thickening. No pneumothorax. 2. Remote right fifth through 10th rib fractures and remote posteromedial left fifth rib fracture. Electronically signed by: Audree Leas MD 07/24/2023 01:01 PM EDT RP Workstation: [id number]   CT HEAD WO CONTRAST Result Date: 07/24/2023 CLINICAL DATA:  Head trauma, moderate-severe Fall down stairs at home. EXAM: CT HEAD WITHOUT CONTRAST TECHNIQUE: Contiguous axial images were obtained from the base of the skull through the vertex without intravenous contrast. RADIATION DOSE REDUCTION: This exam was performed according to the departmental dose-optimization program which includes automated exposure control, adjustment of the mA and/or kV according to patient size and/or use of iterative reconstruction technique. COMPARISON:  Head CT 02/17/2022 FINDINGS: Brain: No intracranial hemorrhage, mass effect, or midline shift. Stable generalized atrophy. No hydrocephalus. The basilar cisterns are patent. Mild chronic small vessel ischemia, similar to prior exam. No evidence of territorial infarct or acute ischemia. Chronic left cerebellar calcification. No extra-axial or intracranial fluid collection. Vascular: No hyperdense vessel or unexpected calcification. Skull: No fracture or focal lesion. Sinuses/Orbits: Paranasal sinuses and mastoid air cells are clear. The visualized orbits are unremarkable. Other: No confluent scalp hematoma. IMPRESSION: 1. No acute intracranial abnormality. No skull fracture. 2. Stable atrophy and chronic small vessel ischemia. Electronically Signed   By: Chadwick Colonel M.D.   On: 07/24/2023 12:59   CT CHEST ABDOMEN PELVIS W CONTRAST Result  Date: 07/24/2023 EXAM: CT CHEST, ABDOMEN AND PELVIS WITH CONTRAST 07/24/2023 12:37:00 PM TECHNIQUE: CT of the chest, abdomen and pelvis was performed with the administration of intravenous contrast. Multiplanar reformatted images are provided for review. Automated exposure control, iterative reconstruction, and/or weight based adjustment of the mA/kV was utilized to reduce the radiation dose to as low as reasonably achievable. COMPARISON: Rib radiographs 09/13/2022. Chest and rib radiographs 09/01/2022.  CT angio chest 09/27/2020. CT abdomen and pelvis 09/26/2020. CLINICAL HISTORY: Marvell Slider down steps back pain and left rib pain. FINDINGS: CHEST: MEDIASTINUM: Heart and pericardium are unremarkable. The central airways are clear. A small hiatal hernia is present. THORACIC LYMPH NODES: No mediastinal, hilar or axillary lymphadenopathy. LUNGS AND PLEURA: Mild-dependent atelectasis is present in both lungs. Pleural thickening is present. No pneumothorax is present. ABDOMEN AND PELVIS: LIVER: Mild diffuse fatty infiltration of the liver is present. GALLBLADDER AND BILE DUCTS: Cholecystectomy noted. SPLEEN: No acute abnormality. PANCREAS: No acute abnormality. ADRENAL GLANDS: No acute abnormality. KIDNEYS, URETERS AND BLADDER: No stones in the kidneys or ureters. No hydronephrosis. No perinephric or periureteral stranding. Urinary bladder is unremarkable. GI AND BOWEL: Stomach demonstrates no acute abnormality. There is no bowel obstruction. No appendicitis. REPRODUCTIVE ORGANS: No acute abnormality. PERITONEUM AND RETROPERITONEUM: No ascites. No free air. VASCULATURE: Mild atherosclerotic changes are present within the abdominal aorta and branch vessels without aneurysm. ABDOMINAL AND PELVIS LYMPH NODES: No lymphadenopathy. REPRODUCTIVE ORGANS: No acute abnormality. BONES AND SOFT TISSUES: Remote healed fractures are present in the right fifth through tenth posterolateral ribs. A healed posterior medial left fifth rib  fracture is noted. present. An acute posterior left eighth rib fracture is displaced by half the shaft width. IMPRESSION: 1. Acute posterior left eighth rib fracture, displaced by half the shaft width. 2. Remote healed fractures in the right fifth through tenth posterolateral ribs and a healed posterior medial left fifth rib fracture. 3. No other acute trauma to the chest, abdomen, or pelvis Electronically signed by: Audree Leas MD 07/24/2023 12:59 PM EDT RP Workstation: [id number]   CT CERVICAL SPINE WO CONTRAST Result Date: 07/24/2023 CLINICAL DATA:  Blunt trauma.  Fall down steps at home. EXAM: CT CERVICAL SPINE WITHOUT CONTRAST TECHNIQUE: Multidetector CT imaging of the cervical spine was performed without intravenous contrast. Multiplanar CT image reconstructions were also generated. RADIATION DOSE REDUCTION: This exam was performed according to the departmental dose-optimization program which includes automated exposure control, adjustment of the mA and/or kV according to patient size and/or use of iterative reconstruction technique. COMPARISON:  CT cervical spine 11/26/2021 FINDINGS: Alignment: Trace anterolisthesis of C5 on C6. no traumatic subluxation. Skull base and vertebrae: No acute fracture. Vertebral body heights are maintained. The dens and skull base are intact. None fusion posterior arch of C1. Soft tissues and spinal canal: No prevertebral fluid or swelling. No visible canal hematoma. Disc levels: Disc space narrowing and spurring C5-C6 and C6-C7. There is multilevel facet hypertrophy and facet ankylosis. No high-grade canal stenosis. Upper chest: Assessed on concurrent chest CT, reported separately. Bilateral thyroid  nodules. This has been evaluated on previous imaging. (ref: J Am Coll Radiol. 2015 Feb;12(2): 143-50).Reference thyroid  ultrasound 02/11/2021 Other: Carotid calcifications. IMPRESSION: 1. No acute fracture or subluxation of the cervical spine. 2. Multilevel degenerative  disc disease and facet hypertrophy. Electronically Signed   By: Chadwick Colonel M.D.   On: 07/24/2023 12:55   DG Pelvis Portable Result Date: 07/24/2023 CLINICAL DATA:  Trauma. EXAM: PORTABLE PELVIS 1 VIEWS COMPARISON:  02/06/2023. FINDINGS: There is right-sided hip degenerative change with joint space narrowing and small osteophytes. Pelvic ring is intact. No acute fracture, dislocation or subluxation. No osteolytic or osteoblastic lesions. IMPRESSION: Right hip degenerative changes.  No acute osseous abnormalities. Electronically Signed   By: Sydell Eva M.D.   On: 07/24/2023 10:51   _______________________________________________________________________________________________________ Latest  Blood pressure 130/80, pulse 100, temperature 98.2 F (36.8 C), temperature source Temporal, resp. rate 16, height 5\' 2"  (  1.575 m), weight 54.4 kg, SpO2 100%.   Vitals  labs and radiology finding personally reviewed  Review of Systems:    Pertinent positives include:   chest pain  Constitutional:  No weight loss, night sweats, Fevers, chills, fatigue, weight loss  HEENT:  No headaches, Difficulty swallowing,Tooth/dental problems,Sore throat,  No sneezing, itching, ear ache, nasal congestion, post nasal drip,  Cardio-vascular:  No, Orthopnea, PND, anasarca, dizziness, palpitations.no Bilateral lower extremity swelling  GI:  No heartburn, indigestion, abdominal pain, nausea, vomiting, diarrhea, change in bowel habits, loss of appetite, melena, blood in stool, hematemesis Resp:  no shortness of breath at rest. No dyspnea on exertion, No excess mucus, no productive cough, No non-productive cough, No coughing up of blood.No change in color of mucus.No wheezing. Skin:  no rash or lesions. No jaundice GU:  no dysuria, change in color of urine, no urgency or frequency. No straining to urinate.  No flank pain.  Musculoskeletal:  No joint pain or no joint swelling. No decreased range of motion. No  back pain.  Psych:  No change in mood or affect. No depression or anxiety. No memory loss.  Neuro: no localizing neurological complaints, no tingling, no weakness, no double vision, no gait abnormality, no slurred speech, no confusion  All systems reviewed and apart from HOPI all are negative _______________________________________________________________________________________________ Past Medical History:   Past Medical History:  Diagnosis Date   Alcohol  withdrawal (HCC) 11/2017; 01/04/2018   Alcoholism (HCC)    Anxiety    Arthritis    "hands" (01/04/2018)   ASYMPTOMATIC POSTMENOPAUSAL STATUS 08/17/2009   Chronic lower back pain    Depression    DYSPHAGIA 06/19/2007   Fatty liver, alcoholic    GERD (gastroesophageal reflux disease)    GOITER, MULTINODULAR 08/17/2009   History of colonic polyps 03/25/2020   Hyperlipidemia    Hypertension    Migraines    "not sure what triggers them; I'll have 1 q couple weeks or more; come 2 days in a row when they come" (01/04/2018)   Mitral valve prolapse    OSTEOPOROSIS 08/17/2009   PONV (postoperative nausea and vomiting)    Supraventricular tachycardia (HCC)       Past Surgical History:  Procedure Laterality Date   BREAST BIOPSY Right    "benign"   CATARACT EXTRACTION W/ INTRAOCULAR LENS  IMPLANT, BILATERAL     CERVICAL CONE BIOPSY  2000s   CERVIX LESION DESTRUCTION  1991   "dysplasia; lesions"   CHOLECYSTECTOMY N/A 03/14/2013   Procedure: LAPAROSCOPIC CHOLECYSTECTOMY WITH ATTEMPTED INTRAOPERATIVE CHOLANGIOGRAM;  Surgeon: Rogena Class, MD;  Location: [facility] OR;  Service: General;  Laterality: N/A;   HARDWARE REMOVAL Left 07/22/2021   Procedure: HARDWARE REMOVAL;  Surgeon: Hardy Lia, MD;  Location: [facility] OR;  Service: Orthopedics;  Laterality: Left;   laporoscopic abdominal surgery     "for endometriosis"   LEFT HEART CATH AND CORONARY ANGIOGRAPHY N/A 05/07/2018   Procedure: LEFT HEART CATH AND CORONARY ANGIOGRAPHY;  Surgeon:  Arty Binning, MD;  Location: [facility] INVASIVE CV LAB;  Service: Cardiovascular;  Laterality: N/A;   ORIF ANKLE FRACTURE Left 02/11/2021   Procedure: OPEN REDUCTION INTERNAL FIXATION (ORIF) ANKLE FRACTURE;  Surgeon: Hardy Lia, MD;  Location: [facility] OR;  Service: Orthopedics;  Laterality: Left;   TONSILLECTOMY      Social History:  Ambulatory  , walker      reports that she quit smoking about 49 years ago. Her smoking use included cigarettes. She started smoking about 56 years ago.  She has a 7 pack-year smoking history. She has been exposed to tobacco smoke. She has never used smokeless tobacco. She reports that she does not currently use alcohol  after a past usage of about 28.0 standard drinks of alcohol  per week. She reports that she does not use drugs.     Family History:   Family History  Problem Relation Age of Onset   Colon cancer Father        Deceased, 16   Osteoporosis Mother        Living, 59   Healthy Brother    Healthy Brother    Healthy Son    Healthy Son    Thyroid  disease Neg Hx    Goiter Neg Hx    ______________________________________________________________________________________________ Allergies: Allergies  Allergen Reactions   Doxycycline Swelling    Mouth swelling and sores  Other Reaction(s): Not available   Codeine Itching    Other Reaction(s): Not available   Tetracycline Hcl Swelling    Mouth swelling and sores     Prior to Admission medications   Medication Sig Start Date End Date Taking? Authorizing Provider  alendronate  (FOSAMAX ) 70 MG tablet Take 70 mg by mouth every Monday.    [provider]  baclofen  (LIORESAL ) 10 MG tablet Take 1 tablet (10 mg total) by mouth 3 (three) times daily. 07/06/23 07/05/24  Kober, Charles E, PA-C  buPROPion  (WELLBUTRIN  XL) 150 MG 24 hr tablet Take 1 tablet (150 mg total) by mouth every morning. 07/06/23 11/03/23  Kober, Charles E, PA-C  busPIRone  (BUSPAR ) 15 MG tablet Take 1 tablet (15 mg total) by mouth 3  (three) times daily. 02/16/23   Kober, Charles E, PA-C  Calcium  Carbonate-Vitamin D  (CALCIUM  600+D PO) Take 1 tablet by mouth daily.    [provider]  carvedilol  (COREG ) 6.25 MG tablet TAKE 1 TABLET BY MOUTH 2 TIMES DAILY WITH A MEAL. 12/15/22   Noreene Bearded, PA  DULoxetine  (CYMBALTA ) 60 MG capsule Take 60 mg by mouth 2 (two) times daily. 05/05/23   [provider]  fluticasone  (FLONASE ) 50 MCG/ACT nasal spray Place 2 sprays into both nostrils daily as needed for allergies.    [provider]  fluticasone  (FLOVENT  HFA) 110 MCG/ACT inhaler  11/03/21   [provider]  folic acid  (FOLVITE ) 1 MG tablet TAKE 1 TABLET BY MOUTH DAILY. 06/27/23   Laneta Pintos, MD  gabapentin  (NEURONTIN ) 300 MG capsule TAKE 1 CAPSULE BY MOUTH 2 TIMES DAILY. 04/03/23   Noreene Bearded, PA  hydrOXYzine  (ATARAX ) 25 MG tablet Take 25 mg by mouth 3 (three) times daily. 01/18/23   [provider]  lidocaine  4 % Place 1 patch onto the skin daily. 02/07/23   Rolinda Climes, DO  meloxicam  (MOBIC ) 15 MG tablet Take 1 tablet (15 mg total) by mouth daily as needed for pain. 08/03/21   Abonza, Maritza, PA-C  methocarbamol  (ROBAXIN ) 750 MG tablet Take 750 mg by mouth at bedtime as needed. 02/15/23   [provider]  Multiple Vitamins-Minerals (ADULT GUMMY PO) Take 2 capsules by mouth daily.    [provider]  norethindrone -ethinyl estradiol  (FEMHRT  1/5) 1-5 MG-MCG TABS tablet Take 1 tablet by mouth daily.    [provider]  oxyCODONE  (ROXICODONE ) 5 MG immediate release tablet Take 1 tablet (5 mg total) by mouth every 4 (four) hours as needed for up to 5 doses for severe pain (pain score 7-10). 07/24/23   Hermann Bureau, PA-C  pantoprazole  (PROTONIX ) 40 MG  tablet TAKE 1 TABLET BY MOUTH 2 TIMES DAILY. 05/08/23   Laneta Pintos, MD  propranolol  (INDERAL ) 10 MG tablet Take 1-2 tablets at onset of panic no more than 2 times daily 02/16/23   Kober, Charles E,  PA-C  SUMAtriptan  (IMITREX ) 100 MG tablet TAKE 1 TABLET (100 MG TOTAL) BY MOUTH AS NEEDED FOR MIGRAINE. MAY REPEAT IN 2 HOURS IF HEADACHE PERSISTS OR RECURS. 09/21/22   Noreene Bearded, PA  traZODone  (DESYREL ) 50 MG tablet Take 1 tablet (50 mg total) by mouth at bedtime. 02/16/23 05/17/23  Kober, Charles E, PA-C  tretinoin (RETIN-A) 0.025 % cream 1 application in the evening to face Externally Once a day, PM pea size amount for 30 days 12/06/21   [provider]    ___________________________________________________________________________________________________ Physical Exam:    07/24/2023    4:30 PM 07/24/2023    4:00 PM 07/24/2023    3:30 PM  Vitals with BMI  Systolic  130 125  Diastolic  80 78  Pulse 100 106 99     1. General:  in No  Acute distress   Chronically ill   -appearing 2. Psychological: Alert and   Oriented 3. Head/ENT:    Dry Mucous Membranes                          Head Non traumatic, neck supple                          Poor Dentition 4. SKIN:  decreased Skin turgor,  Skin clean Dry and intact no rash diaphoretic    5. Heart: Regular rate and rhythm systolic murmur, no Rub or gallop 6. Lungs:  no wheezes or crackles   7. Abdomen: Soft,  non-tender, Non distended  bowel sounds present 8. Lower extremities: no clubbing, cyanosis, no  edema 9. Neurologically Grossly intact, moving all 4 extremities equally tremulous 10. MSK: Normal range of motion    Chart has been reviewed  ______________________________________________________________________________________________  Assessment/Plan  68 y.o. female with medical history significant of depression, anxiety, PTSD, alcohol  use, hypertension neuropathy migraines  Admitted for   Closed fracture of one rib of left side, alcohol  withdrawal   Present on Admission:  Alcohol  withdrawal (HCC)  Alcohol  dependence with withdrawal (HCC)  Alcoholic peripheral neuropathy (HCC)  Benign essential HTN   Gastroesophageal reflux disease without esophagitis  Hepatic steatosis  Prolonged QT interval  Protein-calorie malnutrition, severe (HCC)  Left rib fracture  Elevated TSH     Alcohol  dependence with withdrawal (HCC) Order CIWA protocol  Alcoholic peripheral neuropathy (HCC) Continue Neurontin   Benign essential HTN Continue Coreg  6.25 mg p.o. twice daily  Gastroesophageal reflux disease without esophagitis Continue Protonix  40 mg daily  Hepatic steatosis In the setting of alcohol  abuse  Prolonged QT interval In the past repeat ECG the monitor on telemetry  Protein-calorie malnutrition, severe (HCC) Nutrition consult check prealbumin  Left rib fracture Left eighth rib displaced fracture pain management as needed  Elevated TSH Check t3 and t4   Other plan as per orders.  DVT prophylaxis:  SCD      Code Status:    Code Status: Full Code FULL CODE  as per patient   I had personally discussed CODE STATUS with patient   ACP   none     Family Communication:   Family not at  Bedside   Diet  Diet Orders (From admission, onward)  Start     Ordered   07/24/23 1908  Diet Heart Room service appropriate? Yes; Fluid consistency: Thin  Diet effective now       Question Answer Comment  Room service appropriate? Yes   Fluid consistency: Thin      07/24/23 1908            Disposition Plan:       To home once workup is complete and patient is stable   Following barriers for discharge:                                                                                          Pain controlled with PO medications                                  Consult Orders  (From admission, onward)           Start     Ordered   07/25/23 1907  Nutritional services consult  Once       Provider:  (Not yet assigned)  Question:  Reason for Consult?  Answer:  assess nutrtional status   07/24/23 1908   07/24/23 1907  PT eval and treat  Routine       Question:  Reason for  PT?  Answer:  debility   07/24/23 1908   07/24/23 1907  OT eval and treat  Routine       Question:  Reason for OT?  Answer:  debility   07/24/23 1908                               Would benefit from PT/OT eval prior to DC  Ordered                                      Transition of care consulted                                       Consults called: NONE  Admission status:  ED Disposition     ED Disposition  Admit   Condition  --   Comment  Hospital Area: [facility] [[id number]]  Level of Care: Progressive [102]  Admit to Progressive based on following criteria: NEUROLOGICAL AND NEUROSURGICAL complex patients with significant risk of instability, who do not meet ICU criteria, yet require close observation or frequent assessment (< / = every 2 - 4 hours) with medical / nursing intervention.  Admit to Progressive based on following criteria: ACUTE MENTAL DISORDER-RELATED Drug/Alcohol  Ingestion/Overdose/Withdrawal, Suicidal Ideation/attempt requiring safety sitter and < Q2h monitoring/assessments, moderate to severe agitation that is managed with medication/sitter, CIWA-Ar score < 20.  May admit patient to [facility] or [facility] if equivalent level of care is available:: No  Covid Evaluation: Asymptomatic - no recent exposure (last 10 days) testing not  required  Diagnosis: Alcohol  withdrawal (HCC) [291.81.ICD-9-CM]  Admitting Physician: Karilyn Wind [[id number]]  Attending Physician: Jaelee Laughter [[id number]]  Certification:: I certify this patient will need inpatient services for at least 2 midnights  Expected Medical Readiness: 07/26/2023            inpatient     I Expect 2 midnight stay secondary to severity of patient's current illness need for inpatient interventions justified by the following:  hemodynamic instability despite optimal treatment (tachycardia  )   Severe lab/radiological/exam abnormalities including:    Closed fracture of one rib of  left side   and extensive comorbidities including:  substance abuse   That are currently affecting medical management.   I expect  patient to be hospitalized for 2 midnights requiring inpatient medical care.  Patient is at high risk for adverse outcome (such as loss of life or disability) if not treated.  Indication for inpatient stay as follows:    severe pain requiring acute inpatient management,   Need for  IV fluids,   IV pain medications,      Level of care        progressive    Issac Moure 07/24/2023, 7:24 PM    [facility]     after 2 AM please page floor coverage   If 7AM-7PM, please contact the day team taking care of the patient using [email or web address]

## 2023-07-24 NOTE — ED Provider Notes (Signed)
 [facility] [facility] AT [facility] Provider Note   CSN: [id number] Arrival date & time: 07/24/23  7829     History  Chief Complaint  Patient presents with   Terri Ayala is a 68 y.o. female with PMHx migraines, ETOH use disorder, anxiety, OA, depression, GERD, HLD, HTN, who presents to ED concerned for mechanical fall. Patient stating that they have chronic gait difficulties d/t arthritis and stumbled down around 4-5 stairs today. Patient denies head trauma, LOC, seizures, blood thinners. Patient endorses left sided rib pain along with thoracic/cervical spine pain. Denies any other new pains today.   Fall       Home Medications Prior to Admission medications   Medication Sig Start Date End Date Taking? Authorizing Provider  alendronate  (FOSAMAX ) 70 MG tablet Take 70 mg by mouth every Monday.    [provider]  baclofen  (LIORESAL ) 10 MG tablet Take 1 tablet (10 mg total) by mouth 3 (three) times daily. 07/06/23 07/05/24  Kober, Charles E, PA-C  buPROPion  (WELLBUTRIN  XL) 150 MG 24 hr tablet Take 1 tablet (150 mg total) by mouth every morning. 07/06/23 11/03/23  Kober, Charles E, PA-C  busPIRone  (BUSPAR ) 15 MG tablet Take 1 tablet (15 mg total) by mouth 3 (three) times daily. 02/16/23   Kober, Charles E, PA-C  Calcium  Carbonate-Vitamin D  (CALCIUM  600+D PO) Take 1 tablet by mouth daily.    [provider]  carvedilol  (COREG ) 6.25 MG tablet TAKE 1 TABLET BY MOUTH 2 TIMES DAILY WITH A MEAL. 12/15/22   Noreene Bearded, PA  DULoxetine  (CYMBALTA ) 60 MG capsule Take 60 mg by mouth 2 (two) times daily. 05/05/23   [provider]  fluticasone  (FLONASE ) 50 MCG/ACT nasal spray Place 2 sprays into both nostrils daily as needed for allergies.    [provider]  fluticasone  (FLOVENT  HFA) 110 MCG/ACT inhaler  11/03/21   [provider]  folic acid  (FOLVITE ) 1 MG tablet TAKE 1 TABLET BY MOUTH DAILY. 06/27/23   Laneta Pintos, MD   gabapentin  (NEURONTIN ) 300 MG capsule TAKE 1 CAPSULE BY MOUTH 2 TIMES DAILY. 04/03/23   Noreene Bearded, PA  hydrOXYzine  (ATARAX ) 25 MG tablet Take 25 mg by mouth 3 (three) times daily. 01/18/23   [provider]  lidocaine  4 % Place 1 patch onto the skin daily. 02/07/23   Rolinda Climes, DO  meloxicam  (MOBIC ) 15 MG tablet Take 1 tablet (15 mg total) by mouth daily as needed for pain. 08/03/21   Abonza, Maritza, PA-C  methocarbamol  (ROBAXIN ) 750 MG tablet Take 750 mg by mouth at bedtime as needed. 02/15/23   [provider]  Multiple Vitamins-Minerals (ADULT GUMMY PO) Take 2 capsules by mouth daily.    [provider]  norethindrone -ethinyl estradiol  (FEMHRT  1/5) 1-5 MG-MCG TABS tablet Take 1 tablet by mouth daily.    [provider]  oxyCODONE  (ROXICODONE ) 5 MG immediate release tablet Take 1 tablet (5 mg total) by mouth every 4 (four) hours as needed for up to 5 doses for severe pain (pain score 7-10). 07/24/23   Amberg Bureau, PA-C  pantoprazole  (PROTONIX ) 40 MG tablet TAKE 1 TABLET BY MOUTH 2 TIMES DAILY. 05/08/23   Laneta Pintos, MD  propranolol  (INDERAL ) 10 MG tablet Take 1-2 tablets at onset of panic no more than 2 times daily 02/16/23   Kober, Charles E, PA-C  SUMAtriptan  (IMITREX ) 100 MG tablet TAKE 1 TABLET (100 MG TOTAL) BY MOUTH AS  NEEDED FOR MIGRAINE. MAY REPEAT IN 2 HOURS IF HEADACHE PERSISTS OR RECURS. 09/21/22   Noreene Bearded, PA  traZODone  (DESYREL ) 50 MG tablet Take 1 tablet (50 mg total) by mouth at bedtime. 02/16/23 05/17/23  Kober, Charles E, PA-C  tretinoin (RETIN-A) 0.025 % cream 1 application in the evening to face Externally Once a day, PM pea size amount for 30 days 12/06/21   [provider]      Allergies    Doxycycline, Codeine, and Tetracycline hcl    Review of Systems   Review of Systems  Musculoskeletal:  Positive for back pain.    Physical Exam Updated Vital Signs BP 108/78   Pulse 96   Temp 98.3 F  (36.8 C) (Oral)   Resp 18   Ht 5\' 2"  (1.575 m)   Wt 54.4 kg   SpO2 100%   BMI 21.95 kg/m  Physical Exam Vitals and nursing note reviewed.  Constitutional:      General: She is not in acute distress.    Appearance: She is not ill-appearing or toxic-appearing.  HENT:     Head: Normocephalic and atraumatic.     Mouth/Throat:     Mouth: Mucous membranes are moist.  Eyes:     General: No scleral icterus.       Right eye: No discharge.        Left eye: No discharge.     Conjunctiva/sclera: Conjunctivae normal.  Cardiovascular:     Rate and Rhythm: Normal rate and regular rhythm.     Pulses: Normal pulses.     Heart sounds: Normal heart sounds. No murmur heard. Pulmonary:     Effort: Pulmonary effort is normal. No respiratory distress.     Breath sounds: Normal breath sounds. No wheezing, rhonchi or rales.  Abdominal:     General: Abdomen is flat. Bowel sounds are normal. There is no distension.     Palpations: Abdomen is soft. There is no mass.     Tenderness: There is no abdominal tenderness.  Musculoskeletal:     Right lower leg: No edema.     Left lower leg: No edema.     Comments: Left lower lateral ribs tender to palpation. Right hip tender to palpation.  Skin:    General: Skin is warm and dry.     Findings: No rash.  Neurological:     General: No focal deficit present.     Mental Status: She is alert and oriented to person, place, and time. Mental status is at baseline.     Comments: GCS 15. Speech is goal oriented. No deficits appreciated to CN III-XII. Patient moves extremities without ataxia.    Psychiatric:        Mood and Affect: Mood normal.        Behavior: Behavior normal.     ED Results / Procedures / Treatments   Labs (all labs ordered are listed, but only abnormal results are displayed) Labs Reviewed  CBC WITH DIFFERENTIAL/PLATELET - Abnormal; Notable for the following components:      Result Value   RBC 3.78 (*)    Hemoglobin 11.6 (*)    HCT 34.9  (*)    Abs Immature Granulocytes 0.08 (*)    All other components within normal limits  BASIC METABOLIC PANEL WITH GFR - Abnormal; Notable for the following components:   CO2 21 (*)    Glucose, Bld 106 (*)    Calcium  8.3 (*)    All other components within normal  limits  I-STAT CHEM 8, ED - Abnormal; Notable for the following components:   Glucose, Bld 104 (*)    Calcium , Ion 1.06 (*)    All other components within normal limits    EKG None  Radiology CT T-SPINE NO CHARGE Result Date: 07/24/2023 EXAM: CT THORACIC SPINE WITHOUT CONTRAST 07/24/2023 12:37:00 PM TECHNIQUE: CT of the thoracic spine was performed without the administration of intravenous contrast. Multiplanar reformatted images are provided for review. Automated exposure control, iterative reconstruction, and/or weight based adjustment of the mA/kV was utilized to reduce the radiation dose to as low as reasonably achievable. COMPARISON: None available. CLINICAL HISTORY: Head trauma, moderate-severe; Polytrauma, blunt. FINDINGS: BONES AND ALIGNMENT: There is normal alignment of the spine. The vertebral body heights are maintained. No osseous destructive lesion is seen. An acute fracture of the posterior left eighth rib is displaced 1.5 cm the shaft width. Remote right fifth through 10th rib fractures are present. A remote posteromedial left fifth rib fracture is present. DEGENERATIVE CHANGES: No gross spinal canal stenosis or bony neural foraminal narrowing of the thoracic spine. SOFT TISSUES: No paraspinal mass or hematoma. LIMITED CHEST: Limited images of the chest demonstrate no pneumothorax. Adjacent pleural thickening is present. IMPRESSION: 1. Acute displaced fracture of the posterior left eighth rib with adjacent pleural thickening. No pneumothorax. 2. Remote right fifth through 10th rib fractures and remote posteromedial left fifth rib fracture. Electronically signed by: Audree Leas MD 07/24/2023 01:01 PM EDT RP  Workstation: [id number]   CT HEAD WO CONTRAST Result Date: 07/24/2023 CLINICAL DATA:  Head trauma, moderate-severe Fall down stairs at home. EXAM: CT HEAD WITHOUT CONTRAST TECHNIQUE: Contiguous axial images were obtained from the base of the skull through the vertex without intravenous contrast. RADIATION DOSE REDUCTION: This exam was performed according to the departmental dose-optimization program which includes automated exposure control, adjustment of the mA and/or kV according to patient size and/or use of iterative reconstruction technique. COMPARISON:  Head CT 02/17/2022 FINDINGS: Brain: No intracranial hemorrhage, mass effect, or midline shift. Stable generalized atrophy. No hydrocephalus. The basilar cisterns are patent. Mild chronic small vessel ischemia, similar to prior exam. No evidence of territorial infarct or acute ischemia. Chronic left cerebellar calcification. No extra-axial or intracranial fluid collection. Vascular: No hyperdense vessel or unexpected calcification. Skull: No fracture or focal lesion. Sinuses/Orbits: Paranasal sinuses and mastoid air cells are clear. The visualized orbits are unremarkable. Other: No confluent scalp hematoma. IMPRESSION: 1. No acute intracranial abnormality. No skull fracture. 2. Stable atrophy and chronic small vessel ischemia. Electronically Signed   By: Chadwick Colonel M.D.   On: 07/24/2023 12:59   CT CHEST ABDOMEN PELVIS W CONTRAST Result Date: 07/24/2023 EXAM: CT CHEST, ABDOMEN AND PELVIS WITH CONTRAST 07/24/2023 12:37:00 PM TECHNIQUE: CT of the chest, abdomen and pelvis was performed with the administration of intravenous contrast. Multiplanar reformatted images are provided for review. Automated exposure control, iterative reconstruction, and/or weight based adjustment of the mA/kV was utilized to reduce the radiation dose to as low as reasonably achievable. COMPARISON: Rib radiographs 09/13/2022. Chest and rib radiographs 09/01/2022. CT angio chest  09/27/2020. CT abdomen and pelvis 09/26/2020. CLINICAL HISTORY: Marvell Slider down steps back pain and left rib pain. FINDINGS: CHEST: MEDIASTINUM: Heart and pericardium are unremarkable. The central airways are clear. A small hiatal hernia is present. THORACIC LYMPH NODES: No mediastinal, hilar or axillary lymphadenopathy. LUNGS AND PLEURA: Mild-dependent atelectasis is present in both lungs. Pleural thickening is present. No pneumothorax is present. ABDOMEN AND PELVIS: LIVER: Mild diffuse fatty  infiltration of the liver is present. GALLBLADDER AND BILE DUCTS: Cholecystectomy noted. SPLEEN: No acute abnormality. PANCREAS: No acute abnormality. ADRENAL GLANDS: No acute abnormality. KIDNEYS, URETERS AND BLADDER: No stones in the kidneys or ureters. No hydronephrosis. No perinephric or periureteral stranding. Urinary bladder is unremarkable. GI AND BOWEL: Stomach demonstrates no acute abnormality. There is no bowel obstruction. No appendicitis. REPRODUCTIVE ORGANS: No acute abnormality. PERITONEUM AND RETROPERITONEUM: No ascites. No free air. VASCULATURE: Mild atherosclerotic changes are present within the abdominal aorta and branch vessels without aneurysm. ABDOMINAL AND PELVIS LYMPH NODES: No lymphadenopathy. REPRODUCTIVE ORGANS: No acute abnormality. BONES AND SOFT TISSUES: Remote healed fractures are present in the right fifth through tenth posterolateral ribs. A healed posterior medial left fifth rib fracture is noted. present. An acute posterior left eighth rib fracture is displaced by half the shaft width. IMPRESSION: 1. Acute posterior left eighth rib fracture, displaced by half the shaft width. 2. Remote healed fractures in the right fifth through tenth posterolateral ribs and a healed posterior medial left fifth rib fracture. 3. No other acute trauma to the chest, abdomen, or pelvis Electronically signed by: Audree Leas MD 07/24/2023 12:59 PM EDT RP Workstation: [id number]   CT CERVICAL SPINE WO  CONTRAST Result Date: 07/24/2023 CLINICAL DATA:  Blunt trauma.  Fall down steps at home. EXAM: CT CERVICAL SPINE WITHOUT CONTRAST TECHNIQUE: Multidetector CT imaging of the cervical spine was performed without intravenous contrast. Multiplanar CT image reconstructions were also generated. RADIATION DOSE REDUCTION: This exam was performed according to the departmental dose-optimization program which includes automated exposure control, adjustment of the mA and/or kV according to patient size and/or use of iterative reconstruction technique. COMPARISON:  CT cervical spine 11/26/2021 FINDINGS: Alignment: Trace anterolisthesis of C5 on C6. no traumatic subluxation. Skull base and vertebrae: No acute fracture. Vertebral body heights are maintained. The dens and skull base are intact. None fusion posterior arch of C1. Soft tissues and spinal canal: No prevertebral fluid or swelling. No visible canal hematoma. Disc levels: Disc space narrowing and spurring C5-C6 and C6-C7. There is multilevel facet hypertrophy and facet ankylosis. No high-grade canal stenosis. Upper chest: Assessed on concurrent chest CT, reported separately. Bilateral thyroid  nodules. This has been evaluated on previous imaging. (ref: J Am Coll Radiol. 2015 Feb;12(2): 143-50).Reference thyroid  ultrasound 02/11/2021 Other: Carotid calcifications. IMPRESSION: 1. No acute fracture or subluxation of the cervical spine. 2. Multilevel degenerative disc disease and facet hypertrophy. Electronically Signed   By: Chadwick Colonel M.D.   On: 07/24/2023 12:55   DG Pelvis Portable Result Date: 07/24/2023 CLINICAL DATA:  Trauma. EXAM: PORTABLE PELVIS 1 VIEWS COMPARISON:  02/06/2023. FINDINGS: There is right-sided hip degenerative change with joint space narrowing and small osteophytes. Pelvic ring is intact. No acute fracture, dislocation or subluxation. No osteolytic or osteoblastic lesions. IMPRESSION: Right hip degenerative changes.  No acute osseous  abnormalities. Electronically Signed   By: Sydell Eva M.D.   On: 07/24/2023 10:51    Procedures Procedures    Medications Ordered in ED Medications  ondansetron  (ZOFRAN ) injection 4 mg (4 mg Intravenous Given 07/24/23 1121)  fentaNYL  (SUBLIMAZE ) injection 50 mcg (has no administration in time range)  morphine  (PF) 4 MG/ML injection 4 mg (4 mg Intravenous Given 07/24/23 1122)  LORazepam  (ATIVAN ) injection 1 mg (1 mg Intravenous Given 07/24/23 1201)  iohexol  (OMNIPAQUE ) 350 MG/ML injection 75 mL (75 mLs Intravenous Contrast Given 07/24/23 1245)    ED Course/ Medical Decision Making/ A&P  Medical Decision Making Amount and/or Complexity of Data Reviewed Labs: ordered. Radiology: ordered.  Risk Prescription drug management.   This patient presents to the ED after a fall, this involves an extensive number of treatment options, and is a complaint that carries with it a high risk of complications and morbidity.  The differential diagnosis includes  intracranial hemorrhage, subdural/epidural hematoma, vertebral fracture, spinal cord injury, muscle strain, skull fracture, fracture.   Co morbidities that complicate the patient evaluation  migraines, ETOH use disorder, anxiety, OA, depression, GERD, HLD, HTN,    Additional history obtained:  Dr. Lawanda Prayer PCP   Problem List / ED Course / Critical interventions / Medication management  Patient presented for mechanical fall. Patient with stable vitals and does not appear to be in distress. Patient endorsing left rib pain along with thoracic/cervical spine pain. There is also right hip tenderness which patient states is chronic for her. No other pain. Physical exam with left rib tenderness to palpation. Rest of physical exam reassuring. Patient afebrile with stable vitals.  I Ordered, and personally interpreted labs.  CBC without leukocytosis.  There is mild anemia with hemoglobin 11.6.  BMP  reassuring. I ordered imaging studies including hip xray, CT head/cervical spine/chest/abd/pelv . I independently visualized and interpreted imaging which showed left 8th rib fracture - no other acute concerns. There are past rib fractures on right side which patient states that she sustained last year. I agree with the radiologist interpretation. Shared all results with patient.  Answered all questions.  Provided patient with an incentive spirometer.  Educated patient alternating Advil  and Tylenol  for pain control.  Will provide a couple doses of oxycodone  for breakthrough pain.  Recommended following up with PCP.  Patient verbalized understanding of plan. Staffed with Dr. Zackowski who agrees with plan. I have reviewed the patients home medicines and have made adjustments as needed The patient has been appropriately medically screened and/or stabilized in the ED. I have low suspicion for any other emergent medical condition which would require further screening, evaluation or treatment in the ED or require inpatient management. At time of discharge the patient is hemodynamically stable and in no acute distress. I have discussed work-up results and diagnosis with patient and answered all questions. Patient is agreeable with discharge plan. We discussed strict return precautions for returning to the emergency department and they verbalized understanding.     Social Determinants of Health:  none           Final Clinical Impression(s) / ED Diagnoses Final diagnoses:  Closed fracture of one rib of left side, initial encounter    Rx / DC Orders ED Discharge Orders          Ordered    oxyCODONE  (ROXICODONE ) 5 MG immediate release tablet  Every 4 hours PRN,   Status:  Discontinued        07/24/23 1519    oxyCODONE  (ROXICODONE ) 5 MG immediate release tablet  Every 4 hours PRN        07/24/23 1520              [place], [place] 07/24/23 1521    Nicklas Barns,  MD 07/24/23 1539

## 2023-07-24 NOTE — Assessment & Plan Note (Signed)
Order CIWA protocol °

## 2023-07-24 NOTE — Subjective & Objective (Signed)
 Patient come from home with a fall down for 5 steps.  Patient has been drinking heavily since Christmas about 2 bottles of wine per day. Was under the influence during the fall.  Denies hitting her head but is a little unsure about that.  Denies LOC She is not on blood thinners She has hurt her left side of her rib cage. History of DTs in the past.  History of anxiety and depression. Last EtOH was this morning she is starting to feel firm somewhat tremulous and sweaty now and thinks that she is going through DTs

## 2023-07-24 NOTE — Discharge Instructions (Addendum)
 Please follow-up with your primary care provider.  Seek emergency care if experiencing any new or worsening symptoms.  Alternating between 650 mg Tylenol  and 400 mg Advil : The best way to alternate taking Acetaminophen  (example Tylenol ) and Ibuprofen  (example Advil /Motrin ) is to take them 3 hours apart. For example, if you take ibuprofen  at 6 am you can then take Tylenol  at 9 am. You can continue this regimen throughout the day, making sure you do not exceed the recommended maximum dose for each drug.

## 2023-07-24 NOTE — ED Triage Notes (Addendum)
 Pt from home and fall down 4-5 steps. Denies LOC. Pt had mechanical fall. No blood thinners. Denies hitting head. C/O back pain and left ribs. Pt arrives in c-collar. Pt states she drinks everyday and going through withdrawals. Pt last drank this morning.

## 2023-07-24 NOTE — Assessment & Plan Note (Signed)
 Nutrition consult check prealbumin

## 2023-07-25 ENCOUNTER — Inpatient Hospital Stay (HOSPITAL_COMMUNITY)

## 2023-07-25 DIAGNOSIS — F10239 Alcohol dependence with withdrawal, unspecified: Secondary | ICD-10-CM | POA: Diagnosis not present

## 2023-07-25 DIAGNOSIS — R011 Cardiac murmur, unspecified: Secondary | ICD-10-CM | POA: Diagnosis not present

## 2023-07-25 DIAGNOSIS — R52 Pain, unspecified: Secondary | ICD-10-CM | POA: Diagnosis not present

## 2023-07-25 DIAGNOSIS — I10 Essential (primary) hypertension: Secondary | ICD-10-CM | POA: Diagnosis not present

## 2023-07-25 DIAGNOSIS — S2232XA Fracture of one rib, left side, initial encounter for closed fracture: Secondary | ICD-10-CM | POA: Diagnosis not present

## 2023-07-25 LAB — COMPREHENSIVE METABOLIC PANEL WITH GFR
ALT: 15 U/L (ref 0–44)
ALT: 14 U/L (ref 0–44)
AST: 42 U/L — ABNORMAL HIGH (ref 15–41)
AST: 36 U/L (ref 15–41)
Albumin: 3.4 g/dL — ABNORMAL LOW (ref 3.5–5.0)
Albumin: 3.1 g/dL — ABNORMAL LOW (ref 3.5–5.0)
Alkaline Phosphatase: 64 U/L (ref 38–126)
Alkaline Phosphatase: 58 U/L (ref 38–126)
Anion gap: 10 (ref 5–15)
Anion gap: 11 (ref 5–15)
BUN: 15 mg/dL (ref 8–23)
BUN: 16 mg/dL (ref 8–23)
CO2: 22 mmol/L (ref 22–32)
CO2: 22 mmol/L (ref 22–32)
Calcium: 8.1 mg/dL — ABNORMAL LOW (ref 8.9–10.3)
Calcium: 7.9 mg/dL — ABNORMAL LOW (ref 8.9–10.3)
Chloride: 105 mmol/L (ref 98–111)
Chloride: 104 mmol/L (ref 98–111)
Creatinine, Ser: 0.96 mg/dL (ref 0.44–1.00)
Creatinine, Ser: 0.84 mg/dL (ref 0.44–1.00)
GFR, Estimated: >60 mL/min (ref >60)
GFR, Estimated: >60 mL/min (ref >60)
Glucose, Bld: 103 mg/dL — ABNORMAL HIGH (ref 70–99)
Glucose, Bld: 95 mg/dL (ref 70–99)
Potassium: 3.9 mmol/L (ref 3.5–5.1)
Potassium: 3.7 mmol/L (ref 3.5–5.1)
Sodium: 137 mmol/L (ref 135–145)
Sodium: 137 mmol/L (ref 135–145)
Total Bilirubin: 0.8 mg/dL (ref 0.0–1.2)
Total Bilirubin: 1 mg/dL (ref 0.0–1.2)
Total Protein: 6.3 g/dL — ABNORMAL LOW (ref 6.5–8.1)
Total Protein: 5.9 g/dL — ABNORMAL LOW (ref 6.5–8.1)

## 2023-07-25 LAB — PHOSPHORUS
Phosphorus: 3.8 mg/dL (ref 2.5–4.6)
Phosphorus: 2.9 mg/dL (ref 2.5–4.6)

## 2023-07-25 LAB — IRON AND TIBC
Iron: 261 ug/dL — ABNORMAL HIGH (ref 28–170)
Saturation Ratios: 73 % — ABNORMAL HIGH (ref 10.4–31.8)
TIBC: 358 ug/dL (ref 250–450)
UIBC: 97 ug/dL

## 2023-07-25 LAB — VITAMIN B12: Vitamin B-12: 605 pg/mL (ref 180–914)

## 2023-07-25 LAB — ECHOCARDIOGRAM COMPLETE
AR max vel: 2.37 cm2
AV Peak grad: 10.2 mmHg
Ao pk vel: 1.6 m/s
Area-P 1/2: 5.54 cm2
Height: 62 in
MV VTI: 3.8 cm2
S' Lateral: 2.8 cm
Weight: 1920 [oz_av]

## 2023-07-25 LAB — FERRITIN: Ferritin: 69 ng/mL (ref 11–307)

## 2023-07-25 LAB — CBC
HCT: 33.3 % — ABNORMAL LOW (ref 36.0–46.0)
Hemoglobin: 10.8 g/dL — ABNORMAL LOW (ref 12.0–15.0)
MCH: 30.7 pg (ref 26.0–34.0)
MCHC: 32.4 g/dL (ref 30.0–36.0)
MCV: 94.6 fL (ref 80.0–100.0)
Platelets: 150 10*3/uL (ref 150–400)
RBC: 3.52 MIL/uL — ABNORMAL LOW (ref 3.87–5.11)
RDW: 14.5 % (ref 11.5–15.5)
WBC: 5.9 10*3/uL (ref 4.0–10.5)
nRBC: 0 % (ref 0.0–0.2)

## 2023-07-25 LAB — CK: Total CK: 64 U/L (ref 38–234)

## 2023-07-25 LAB — MAGNESIUM
Magnesium: 1.6 mg/dL — ABNORMAL LOW (ref 1.7–2.4)
Magnesium: 1.5 mg/dL — ABNORMAL LOW (ref 1.7–2.4)

## 2023-07-25 LAB — FOLATE: Folate: 27.4 ng/mL (ref >5.9)

## 2023-07-25 LAB — PREALBUMIN: Prealbumin: 27 mg/dL (ref 18–38)

## 2023-07-25 LAB — HIV ANTIBODY (ROUTINE TESTING W REFLEX): HIV Screen 4th Generation wRfx: NONREACTIVE

## 2023-07-25 LAB — T4, FREE: Free T4: 0.9 ng/dL (ref 0.61–1.12)

## 2023-07-25 MED ORDER — POTASSIUM CHLORIDE CRYS ER 20 MEQ PO TBCR
40.0000 meq | EXTENDED_RELEASE_TABLET | Freq: Two times a day (BID) | ORAL | Status: AC
Start: 2023-07-25 — End: 2023-07-25
  Administered 2023-07-25 (×2): 40 meq via ORAL
  Filled 2023-07-25 (×2): qty 2

## 2023-07-25 MED ORDER — PROCHLORPERAZINE EDISYLATE 10 MG/2ML IJ SOLN
10.0000 mg | INTRAMUSCULAR | Status: DC | PRN
  Administered 2023-07-26: 10 mg via INTRAVENOUS
  Filled 2023-07-25 (×2): qty 2

## 2023-07-25 MED ORDER — POLYETHYLENE GLYCOL 3350 17 G PO PACK
17.0000 g | PACK | Freq: Two times a day (BID) | ORAL | Status: AC
  Administered 2023-07-25 – 2023-07-26 (×4): 17 g via ORAL
  Filled 2023-07-25 (×4): qty 1

## 2023-07-25 MED ORDER — MAGNESIUM SULFATE 2 GM/50ML IV SOLN
2.0000 g | Freq: Once | INTRAVENOUS | Status: AC
Start: 2023-07-25 — End: 2023-07-25
  Administered 2023-07-25: 2 g via INTRAVENOUS
  Filled 2023-07-25: qty 50

## 2023-07-25 MED ORDER — MAGNESIUM OXIDE -MG SUPPLEMENT 400 (240 MG) MG PO TABS
400.0000 mg | ORAL_TABLET | Freq: Two times a day (BID) | ORAL | Status: AC
  Administered 2023-07-25 (×2): 400 mg via ORAL
  Filled 2023-07-25 (×2): qty 1

## 2023-07-25 NOTE — Progress Notes (Signed)
 OT Cancellation Note  Patient Details Name: SHALAMAR PLOURDE MRN: [id number] DOB: May 10, 1955   Cancelled Treatment:    Reason Eval/Treat Not Completed: Medical issues which prohibited therapy.  Received sedating meds overnight, still resting soundly.  OT will monitor for possibility of executing eval.    Benjamen Brand 07/25/2023, 9:27 AM 07/25/2023  RP, OTR/L  Acute Rehabilitation Services  Office:  [phone]

## 2023-07-25 NOTE — Progress Notes (Signed)
 PT Cancellation Note  Patient Details Name: Terri Ayala MRN: [id number] DOB: [birth year]   Cancelled Treatment:    Reason Eval/Treat Not Completed: Fatigue/lethargy limiting ability to participate. Pt received ativan  overnight. Unable to rouse this AM for meaningful participation in PT eval. Will re-attempt as time allows.   Guadelupe Leech 07/25/2023, 9:09 AM

## 2023-07-25 NOTE — Progress Notes (Signed)
 CSW added substance abuse resources to patient's AVS.  Edwin Dada, MSW, LCSW Transitions of Care  Clinical Social Worker II [phone]

## 2023-07-25 NOTE — Progress Notes (Signed)
 [facility] PROGRESS NOTE    Progress Note  Terri Ayala  LKG:[id number] DOB: 1955/07/26 DOA: 07/24/2023 PCP: Noreene Bearded, PA     Brief Narrative:   Terri Ayala is an 68 y.o. female past medical history significant for depression anxiety PTSD, alcohol  use and hypertension comes to the ED after a fall, he relates he has been drinking heavily before this denies hitting her head or loss of consciousness.   Assessment/Plan:   Alcohol  dependence with withdrawals: Start on thiamine  and folate. Full resuscitated started on the CIWA protocol  Acute posterior left eighth rib fracture: Appreciated on CT she also has old healed rib fractures. Continue narcotics for pain. Started on a bowel regimen. Alcoholic peripheral neuropathy: Continue Neurontin .  Benign essential hypertension: Continue Coreg  twice a day.  GERD: Continue PPI.  Hepatic steatosis In the setting of alcohol  abuse noted.  Prolonged QTc: Try to potassium greater than 4 magnesium  greater than 2. First is around 3.  Protein-calorie malnutrition, severe (HCC): Noted.   DVT prophylaxis: Lovenox  Family Communication:none Status is: Inpatient Remains inpatient appropriate because: Alcohol  abuse with withdrawals   Code Status:     Code Status Orders  (From admission, onward)           Start     Ordered   07/24/23 1908  Full code  Continuous       Question:  By:  Answer:  Consent: discussion documented in EHR   07/24/23 1908           Code Status History     Date Active Date Inactive Code Status Order ID Comments User Context   02/16/2022 0018 02/25/2022 1553 Full Code [id number]  Bary Boss, DO Inpatient   01/25/2022 2250 01/29/2022 1848 Full Code [id number]  Roxana Copier, DO ED   01/25/2022 1132 01/25/2022 2250 Full Code [id number]  Lorra Rosella, PA-C ED   06/08/2021 2002 06/11/2021 1758 Full Code [id number]  Sabas Cradle, MD ED   02/11/2021 0223 02/16/2021 2145  Full Code [id number]  Chotiner, Pauline Bos, MD ED   11/21/2020 0917 11/23/2020 1732 Full Code [id number]  Lorita Rosa, MD ED   11/20/2020 2154 11/21/2020 0917 Full Code [id number]  Coretta Dexter, PA ED   09/26/2020 1726 10/01/2020 0452 Full Code [id number]  Marine Sia, MD Inpatient   03/16/2019 2057 03/21/2019 2025 Full Code [id number]  Juliette Oh, MD ED   03/16/2019 1257 03/16/2019 1258 Full Code [id number]  Coretta Dexter, PA ED   02/06/2019 0150 02/08/2019 2211 Full Code [id number]  Vanita Gens, MD ED   05/05/2018 0311 05/08/2018 1507 Full Code [id number]  Dea Evert, DO ED   01/04/2018 1654 01/10/2018 1754 Full Code [id number]  Lorita Rosa, MD ED   12/08/2017 0509 12/10/2017 1937 Full Code [id number]  Lena Qualia, MD ED   11/06/2017 2353 11/09/2017 1602 Full Code [id number]  Pati Bonine, MD ED   04/03/2014 1352 04/07/2014 2119 Full Code [id number]  Alfreida Inches, NP Inpatient   04/03/2014 1352 04/03/2014 1352 Full Code [id number]  Alfreida Inches, NP Inpatient   04/02/2014 2218 04/03/2014 1352 Full Code [id number]  Marlo Simpler ED   06/21/2013 1333 06/21/2013 2103 Full Code [id number]  Mark Sil, MD Inpatient   06/17/2013 1958 06/18/2013 0342 Full Code [id number]  Mee Spillers., MD ED   03/14/2013 1235 03/26/2013 1917 Full Code [id number]  Oza Blumenthal, MD Inpatient         IV  Access:   Peripheral IV   Procedures and diagnostic studies:   CT T-SPINE NO CHARGE Result Date: 07/24/2023 EXAM: CT THORACIC SPINE WITHOUT CONTRAST 07/24/2023 12:37:00 PM TECHNIQUE: CT of the thoracic spine was performed without the administration of intravenous contrast. Multiplanar reformatted images are provided for review. Automated exposure control, iterative reconstruction, and/or weight based adjustment of the mA/kV was utilized to reduce the radiation dose to as low as reasonably achievable. COMPARISON: None available. CLINICAL HISTORY: Head trauma, moderate-severe; Polytrauma,  blunt. FINDINGS: BONES AND ALIGNMENT: There is normal alignment of the spine. The vertebral body heights are maintained. No osseous destructive lesion is seen. An acute fracture of the posterior left eighth rib is displaced 1.5 cm the shaft width. Remote right fifth through 10th rib fractures are present. A remote posteromedial left fifth rib fracture is present. DEGENERATIVE CHANGES: No gross spinal canal stenosis or bony neural foraminal narrowing of the thoracic spine. SOFT TISSUES: No paraspinal mass or hematoma. LIMITED CHEST: Limited images of the chest demonstrate no pneumothorax. Adjacent pleural thickening is present. IMPRESSION: 1. Acute displaced fracture of the posterior left eighth rib with adjacent pleural thickening. No pneumothorax. 2. Remote right fifth through 10th rib fractures and remote posteromedial left fifth rib fracture. Electronically signed by: Audree Leas MD 07/24/2023 01:01 PM EDT RP Workstation: [id number]   CT HEAD WO CONTRAST Result Date: 07/24/2023 CLINICAL DATA:  Head trauma, moderate-severe Fall down stairs at home. EXAM: CT HEAD WITHOUT CONTRAST TECHNIQUE: Contiguous axial images were obtained from the base of the skull through the vertex without intravenous contrast. RADIATION DOSE REDUCTION: This exam was performed according to the departmental dose-optimization program which includes automated exposure control, adjustment of the mA and/or kV according to patient size and/or use of iterative reconstruction technique. COMPARISON:  Head CT 02/17/2022 FINDINGS: Brain: No intracranial hemorrhage, mass effect, or midline shift. Stable generalized atrophy. No hydrocephalus. The basilar cisterns are patent. Mild chronic small vessel ischemia, similar to prior exam. No evidence of territorial infarct or acute ischemia. Chronic left cerebellar calcification. No extra-axial or intracranial fluid collection. Vascular: No hyperdense vessel or unexpected calcification. Skull: No  fracture or focal lesion. Sinuses/Orbits: Paranasal sinuses and mastoid air cells are clear. The visualized orbits are unremarkable. Other: No confluent scalp hematoma. IMPRESSION: 1. No acute intracranial abnormality. No skull fracture. 2. Stable atrophy and chronic small vessel ischemia. Electronically Signed   By: Chadwick Colonel M.D.   On: 07/24/2023 12:59   CT CHEST ABDOMEN PELVIS W CONTRAST Result Date: 07/24/2023 EXAM: CT CHEST, ABDOMEN AND PELVIS WITH CONTRAST 07/24/2023 12:37:00 PM TECHNIQUE: CT of the chest, abdomen and pelvis was performed with the administration of intravenous contrast. Multiplanar reformatted images are provided for review. Automated exposure control, iterative reconstruction, and/or weight based adjustment of the mA/kV was utilized to reduce the radiation dose to as low as reasonably achievable. COMPARISON: Rib radiographs 09/13/2022. Chest and rib radiographs 09/01/2022. CT angio chest 09/27/2020. CT abdomen and pelvis 09/26/2020. CLINICAL HISTORY: Marvell Slider down steps back pain and left rib pain. FINDINGS: CHEST: MEDIASTINUM: Heart and pericardium are unremarkable. The central airways are clear. A small hiatal hernia is present. THORACIC LYMPH NODES: No mediastinal, hilar or axillary lymphadenopathy. LUNGS AND PLEURA: Mild-dependent atelectasis is present in both lungs. Pleural thickening is present. No pneumothorax is present. ABDOMEN AND PELVIS: LIVER: Mild diffuse fatty infiltration of the liver is present. GALLBLADDER AND BILE DUCTS: Cholecystectomy noted. SPLEEN: No acute abnormality. PANCREAS: No acute abnormality. ADRENAL GLANDS: No acute abnormality. KIDNEYS, URETERS AND BLADDER:  No stones in the kidneys or ureters. No hydronephrosis. No perinephric or periureteral stranding. Urinary bladder is unremarkable. GI AND BOWEL: Stomach demonstrates no acute abnormality. There is no bowel obstruction. No appendicitis. REPRODUCTIVE ORGANS: No acute abnormality. PERITONEUM AND  RETROPERITONEUM: No ascites. No free air. VASCULATURE: Mild atherosclerotic changes are present within the abdominal aorta and branch vessels without aneurysm. ABDOMINAL AND PELVIS LYMPH NODES: No lymphadenopathy. REPRODUCTIVE ORGANS: No acute abnormality. BONES AND SOFT TISSUES: Remote healed fractures are present in the right fifth through tenth posterolateral ribs. A healed posterior medial left fifth rib fracture is noted. present. An acute posterior left eighth rib fracture is displaced by half the shaft width. IMPRESSION: 1. Acute posterior left eighth rib fracture, displaced by half the shaft width. 2. Remote healed fractures in the right fifth through tenth posterolateral ribs and a healed posterior medial left fifth rib fracture. 3. No other acute trauma to the chest, abdomen, or pelvis Electronically signed by: Audree Leas MD 07/24/2023 12:59 PM EDT RP Workstation: [id number]   CT CERVICAL SPINE WO CONTRAST Result Date: 07/24/2023 CLINICAL DATA:  Blunt trauma.  Fall down steps at home. EXAM: CT CERVICAL SPINE WITHOUT CONTRAST TECHNIQUE: Multidetector CT imaging of the cervical spine was performed without intravenous contrast. Multiplanar CT image reconstructions were also generated. RADIATION DOSE REDUCTION: This exam was performed according to the departmental dose-optimization program which includes automated exposure control, adjustment of the mA and/or kV according to patient size and/or use of iterative reconstruction technique. COMPARISON:  CT cervical spine 11/26/2021 FINDINGS: Alignment: Trace anterolisthesis of C5 on C6. no traumatic subluxation. Skull base and vertebrae: No acute fracture. Vertebral body heights are maintained. The dens and skull base are intact. None fusion posterior arch of C1. Soft tissues and spinal canal: No prevertebral fluid or swelling. No visible canal hematoma. Disc levels: Disc space narrowing and spurring C5-C6 and C6-C7. There is multilevel facet  hypertrophy and facet ankylosis. No high-grade canal stenosis. Upper chest: Assessed on concurrent chest CT, reported separately. Bilateral thyroid  nodules. This has been evaluated on previous imaging. (ref: J Am Coll Radiol. 2015 Feb;12(2): 143-50).Reference thyroid  ultrasound 02/11/2021 Other: Carotid calcifications. IMPRESSION: 1. No acute fracture or subluxation of the cervical spine. 2. Multilevel degenerative disc disease and facet hypertrophy. Electronically Signed   By: Chadwick Colonel M.D.   On: 07/24/2023 12:55   DG Pelvis Portable Result Date: 07/24/2023 CLINICAL DATA:  Trauma. EXAM: PORTABLE PELVIS 1 VIEWS COMPARISON:  02/06/2023. FINDINGS: There is right-sided hip degenerative change with joint space narrowing and small osteophytes. Pelvic ring is intact. No acute fracture, dislocation or subluxation. No osteolytic or osteoblastic lesions. IMPRESSION: Right hip degenerative changes.  No acute osseous abnormalities. Electronically Signed   By: Sydell Eva M.D.   On: 07/24/2023 10:51     Medical Consultants:   None.   Subjective:    Marigene Shoulder complains of pain.  Objective:    Vitals:   07/25/23 0425 07/25/23 0430 07/25/23 0500 07/25/23 0530  BP:  129/80 106/82 119/82  Pulse: (!) 103 (!) 106 (!) 106 (!) 107  Resp: (!) 25 (!) 22 17 17   Temp:      TempSrc:      SpO2: 95% 92% 93% 96%  Weight:      Height:       SpO2: 96 %  No intake or output data in the 24 hours ending 07/25/23 0607 Filed Weights   07/24/23 1002  Weight: 54.4 kg    Exam: General exam: In  no acute distress. Respiratory system: Good air movement and clear to auscultation. Cardiovascular system: S1 & S2 heard, RRR. No JVD. Gastrointestinal system: Abdomen is nondistended, soft and nontender.  Extremities: No pedal edema. Skin: No rashes, lesions or ulcers Psychiatry: Judgement and insight appear normal. Mood & affect appropriate.    Data Reviewed:    Labs: Basic Metabolic  Panel: Recent Labs  Lab 07/24/23 1049 07/24/23 1055 07/24/23 2250 07/25/23 0500  NA 135 137 137 137  K 4.1 3.8 3.9 3.7  CL 102 102 105 104  CO2 21*  --  22 22  GLUCOSE 106* 104* 103* 95  BUN 15 16 15 16   CREATININE 0.77 0.90 0.96 0.84  CALCIUM  8.3*  --  8.1* 7.9*  MG  --   --  1.6* 1.5*  PHOS  --   --  3.8 2.9   GFR Estimated Creatinine Clearance: 50.7 mL/min (by C-G formula based on SCr of 0.84 mg/dL). Liver Function Tests: Recent Labs  Lab 07/24/23 2250 07/25/23 0500  AST 42* 36  ALT 15 14  ALKPHOS 64 58  BILITOT 0.8 1.0  PROT 6.3* 5.9*  ALBUMIN 3.4* 3.1*   No results for input(s): "LIPASE", "AMYLASE" in the last 168 hours. Recent Labs  Lab 07/24/23 2255  AMMONIA 25   Coagulation profile Recent Labs  Lab 07/24/23 2250  INR 1.0   COVID-19 Labs  Recent Labs    07/24/23 2250  FERRITIN 69    Lab Results  Component Value Date   SARSCOV2NAA NEGATIVE 02/15/2021   SARSCOV2NAA NEGATIVE 02/10/2021   SARSCOV2NAA NEGATIVE 09/26/2020   SARSCOV2NAA NEGATIVE 03/16/2019    CBC: Recent Labs  Lab 07/24/23 1049 07/24/23 1055 07/25/23 0500  WBC 8.8  --  5.9  NEUTROABS 6.6  --   --   HGB 11.6* 12.6 10.8*  HCT 34.9* 37.0 33.3*  MCV 92.3  --  94.6  PLT 178  --  150   Cardiac Enzymes: Recent Labs  Lab 07/24/23 2250  CKTOTAL 64   BNP (last 3 results) No results for input(s): "PROBNP" in the last 8760 hours. CBG: No results for input(s): "GLUCAP" in the last 168 hours. D-Dimer: No results for input(s): "DDIMER" in the last 72 hours. Hgb A1c: No results for input(s): "HGBA1C" in the last 72 hours. Lipid Profile: No results for input(s): "CHOL", "HDL", "LDLCALC", "TRIG", "CHOLHDL", "LDLDIRECT" in the last 72 hours. Thyroid  function studies: Recent Labs    07/24/23 2250  TSH 6.915*   Anemia work up: Recent Labs    07/24/23 2250  FERRITIN 69  TIBC 358  IRON 261*  RETICCTPCT 2.7   Sepsis Labs: Recent Labs  Lab 07/24/23 1049 07/25/23 0500   WBC 8.8 5.9   Microbiology No results found for this or any previous visit (from the past 240 hours).   Medications:    folic acid   1 mg Oral Daily   multivitamin with minerals  1 tablet Oral Daily   thiamine   100 mg Oral Daily   Or   thiamine   100 mg Intravenous Daily   Continuous Infusions:    LOS: 1 day   Macdonald Savoy  [facility]  07/25/2023, 6:07 AM

## 2023-07-25 NOTE — Plan of Care (Signed)
?  Problem: Clinical Measurements: ?Goal: Will remain free from infection ?Outcome: Progressing ?Goal: Diagnostic test results will improve ?Outcome: Progressing ?  ?Problem: Activity: ?Goal: Risk for activity intolerance will decrease ?Outcome: Progressing ?  ?Problem: Coping: ?Goal: Level of anxiety will decrease ?Outcome: Progressing ?  ?

## 2023-07-25 NOTE — Evaluation (Signed)
 Occupational Therapy Evaluation Patient Details Name: Terri Ayala MRN: [id number] DOB: [birth year] Today's Date: 07/25/2023   History of Present Illness   68 y.o. female adm 5/26 with medical history significant of depression, anxiety, PTSD, heavy alcohol  use, hypertension neuropathy migraines.  Presented after a fall at home down 5 steps.  Imaging reveals Acute displaced fracture of the posterior left eighth rib.     Clinical Impressions Patient admitted for the diagnosis above.  PTA, the patient lives at home, has a PCA daily for iADL, meals, and community mobility.  The patient stating she completes her own ADL via sink baths.  Currently the patient is limited by pain associated with her new rib fracture, and lethargy.  Patient needing Max to near Total A for simple bed mobility, and ADL completion largely at a bedlevel.  OT will continue efforts in the acute setting to address deficits, and monitor progress to assist with discharge planning.  Currently, Patient will benefit from continued inpatient follow up therapy, <3 hours/day.       If plan is discharge home, recommend the following:   Assist for transportation;Assistance with cooking/housework;Two people to help with walking and/or transfers;A lot of help with bathing/dressing/bathroom;Direct supervision/assist for medications management;Supervision due to cognitive status     Functional Status Assessment   Patient has had a recent decline in their functional status and demonstrates the ability to make significant improvements in function in a reasonable and predictable amount of time.     Equipment Recommendations   None recommended by OT     Recommendations for Other Services         Precautions/Restrictions   Precautions Precautions: Fall Precaution/Restrictions Comments: ETOH Withdrawals Restrictions Weight Bearing Restrictions Per Provider Order: No     Mobility Bed Mobility Overal bed mobility: Needs  Assistance Bed Mobility: Supine to Sit, Sit to Supine     Supine to sit: Max assist Sit to supine: Total assist   General bed mobility comments: Assist for trunk and legs both off the stretcher and back on.    Transfers                   General transfer comment: NT, patient deferred due to rib pain      Balance Overall balance assessment: Needs assistance Sitting-balance support: Feet unsupported Sitting balance-Leahy Scale: Poor   Postural control: Right lateral lean, Posterior lean                                 ADL either performed or assessed with clinical judgement   ADL Overall ADL's : Needs assistance/impaired Eating/Feeding: Minimal assistance;Bed level   Grooming: Wash/dry face;Minimal assistance;Bed level   Upper Body Bathing: Moderate assistance;Bed level   Lower Body Bathing: Bed level;Maximal assistance   Upper Body Dressing : Moderate assistance;Bed level   Lower Body Dressing: Maximal assistance;Bed level       Toileting- Clothing Manipulation and Hygiene: Total assistance;Bed level               Vision   Vision Assessment?: No apparent visual deficits     Perception Perception: Not tested       Praxis Praxis: Not tested       Pertinent Vitals/Pain Pain Assessment Pain Assessment: Faces Faces Pain Scale: Hurts whole lot Pain Location: L flank with bed mobility Pain Descriptors / Indicators: Grimacing, Guarding, Sharp Pain Intervention(s): Monitored during session  Extremity/Trunk Assessment Upper Extremity Assessment Upper Extremity Assessment: Right hand dominant;Generalized weakness   Lower Extremity Assessment Lower Extremity Assessment: Defer to PT evaluation   Cervical / Trunk Assessment Cervical / Trunk Assessment: Kyphotic;Other exceptions Cervical / Trunk Exceptions: rib fracture   Communication Communication Communication: Impaired Factors Affecting Communication: Hearing impaired    Cognition Arousal: Lethargic Behavior During Therapy: Flat affect Cognition: Cognition impaired, Difficult to assess   Orientation impairments: Person   Memory impairment (select all impairments): Short-term memory Attention impairment (select first level of impairment): Focused attention Executive functioning impairment (select all impairments): Reasoning, Problem solving                   Following commands: Impaired Following commands impaired: Follows one step commands inconsistently     Cueing  General Comments   Cueing Techniques: Verbal cues;Gestural cues;Tactile cues   VSS on RA   Exercises     Shoulder Instructions      Home Living Family/patient expects to be discharged to:: Private residence Living Arrangements: Alone Available Help at Discharge: Personal care attendant Type of Home: House Home Access: Stairs to enter [employer] of Steps: 1 Entrance Stairs-Rails: Right;Left Home Layout: 1/2 bath on main level;Bed/bath upstairs;Able to live on main level with bedroom/bathroom     Bathroom Shower/Tub: Tub/shower unit;Sponge bathes at baseline   [employer]: Standard     Home Equipment: Agricultural consultant (2 wheels);Cane - single point;BSC/3in1;Shower seat          Prior Functioning/Environment Prior Level of Function : Patient poor historian/Family not available             Mobility Comments: Has a 2WRW availale, unclear if she uses it consistently. ADLs Comments: PCA daily that assists with iADL, meals, community mobility and assists with her pet.  Patient stating she performs her own sinkbath.    OT Problem List: Decreased strength;Decreased activity tolerance;Impaired balance (sitting and/or standing);Pain;Decreased safety awareness;Decreased cognition   OT Treatment/Interventions: Self-care/ADL training;Cognitive remediation/compensation;Patient/family education;DME and/or AE instruction;Balance training      OT  Goals(Current goals can be found in the care plan section)   Acute Rehab OT Goals OT Goal Formulation: Patient unable to participate in goal setting Time For Goal Achievement: 08/08/23 Potential to Achieve Goals: Fair   OT Frequency:  Min 2X/week    Co-evaluation              AM-PAC OT "6 Clicks" Daily Activity     Outcome Measure Help from another person eating meals?: A Little Help from another person taking care of personal grooming?: A Little Help from another person toileting, which includes using toliet, bedpan, or urinal?: A Lot Help from another person bathing (including washing, rinsing, drying)?: A Lot Help from another person to put on and taking off regular upper body clothing?: A Lot Help from another person to put on and taking off regular lower body clothing?: A Lot 6 Click Score: 14   End of Session Nurse Communication: Other (comment)  Activity Tolerance: Patient limited by pain Patient left: in bed;with call bell/phone within reach  OT Visit Diagnosis: Unsteadiness on feet (R26.81);Muscle weakness (generalized) (M62.81);History of falling (Z91.81);Other symptoms and signs involving cognitive function                Time: 1439-1459 OT Time Calculation (min): 20 min Charges:  OT General Charges $OT Visit: 1 Visit OT Evaluation $OT Eval Moderate Complexity: 1 Mod  07/25/2023  RP, OTR/L  Acute Rehabilitation Services  Office:  [phone]   Terri Ayala 07/25/2023, 3:09 PM

## 2023-07-26 DIAGNOSIS — F10239 Alcohol dependence with withdrawal, unspecified: Secondary | ICD-10-CM | POA: Diagnosis not present

## 2023-07-26 DIAGNOSIS — S2232XA Fracture of one rib, left side, initial encounter for closed fracture: Secondary | ICD-10-CM | POA: Diagnosis not present

## 2023-07-26 LAB — CBC
HCT: 36.5 % (ref 36.0–46.0)
Hemoglobin: 12.3 g/dL (ref 12.0–15.0)
MCH: 30.7 pg (ref 26.0–34.0)
MCHC: 33.7 g/dL (ref 30.0–36.0)
MCV: 91 fL (ref 80.0–100.0)
Platelets: 175 10*3/uL (ref 150–400)
RBC: 4.01 MIL/uL (ref 3.87–5.11)
RDW: 13.4 % (ref 11.5–15.5)
WBC: 7.8 10*3/uL (ref 4.0–10.5)
nRBC: 0 % (ref 0.0–0.2)

## 2023-07-26 LAB — BASIC METABOLIC PANEL WITH GFR
Anion gap: 12 (ref 5–15)
BUN: 10 mg/dL (ref 8–23)
CO2: 21 mmol/L — ABNORMAL LOW (ref 22–32)
Calcium: 8.9 mg/dL (ref 8.9–10.3)
Chloride: 103 mmol/L (ref 98–111)
Creatinine, Ser: 0.67 mg/dL (ref 0.44–1.00)
GFR, Estimated: >60 mL/min (ref >60)
Glucose, Bld: 117 mg/dL — ABNORMAL HIGH (ref 70–99)
Potassium: 4.3 mmol/L (ref 3.5–5.1)
Sodium: 136 mmol/L (ref 135–145)

## 2023-07-26 LAB — T3: T3, Total: 100 ng/dL (ref 71–180)

## 2023-07-26 LAB — PHOSPHORUS: Phosphorus: 1.9 mg/dL — ABNORMAL LOW (ref 2.5–4.6)

## 2023-07-26 LAB — GLUCOSE, CAPILLARY: Glucose-Capillary: 119 mg/dL — ABNORMAL HIGH (ref 70–99)

## 2023-07-26 LAB — MAGNESIUM: Magnesium: 2.1 mg/dL (ref 1.7–2.4)

## 2023-07-26 MED ORDER — CARVEDILOL 6.25 MG PO TABS
6.2500 mg | ORAL_TABLET | Freq: Two times a day (BID) | ORAL | Status: DC
  Administered 2023-07-26 – 2023-08-07 (×24): 6.25 mg via ORAL
  Filled 2023-07-26 (×25): qty 1

## 2023-07-26 MED ORDER — BUPROPION HCL ER (XL) 150 MG PO TB24
150.0000 mg | ORAL_TABLET | ORAL | Status: DC
  Administered 2023-07-26 – 2023-08-07 (×13): 150 mg via ORAL
  Filled 2023-07-26 (×13): qty 1

## 2023-07-26 MED ORDER — K PHOS MONO-SOD PHOS DI & MONO 155-852-130 MG PO TABS
500.0000 mg | ORAL_TABLET | Freq: Four times a day (QID) | ORAL | Status: AC
  Administered 2023-07-26 – 2023-07-27 (×4): 500 mg via ORAL
  Filled 2023-07-26 (×4): qty 2

## 2023-07-26 MED ORDER — GABAPENTIN 300 MG PO CAPS
300.0000 mg | ORAL_CAPSULE | Freq: Two times a day (BID) | ORAL | Status: DC
  Administered 2023-07-26 – 2023-08-03 (×17): 300 mg via ORAL
  Filled 2023-07-26 (×17): qty 1

## 2023-07-26 MED ORDER — PANTOPRAZOLE SODIUM 40 MG PO TBEC
40.0000 mg | DELAYED_RELEASE_TABLET | Freq: Two times a day (BID) | ORAL | Status: DC
  Administered 2023-07-26 – 2023-08-07 (×25): 40 mg via ORAL
  Filled 2023-07-26 (×25): qty 1

## 2023-07-26 MED ORDER — BUSPIRONE HCL 5 MG PO TABS
15.0000 mg | ORAL_TABLET | Freq: Three times a day (TID) | ORAL | Status: DC
  Administered 2023-07-26 – 2023-08-07 (×37): 15 mg via ORAL
  Filled 2023-07-26 (×37): qty 3

## 2023-07-26 MED ORDER — BACLOFEN 10 MG PO TABS
10.0000 mg | ORAL_TABLET | Freq: Three times a day (TID) | ORAL | Status: DC
Start: 2023-07-26 — End: 2023-08-07
  Administered 2023-07-26 – 2023-08-07 (×37): 10 mg via ORAL
  Filled 2023-07-26 (×37): qty 1

## 2023-07-26 MED ORDER — DULOXETINE HCL 60 MG PO CPEP
60.0000 mg | ORAL_CAPSULE | Freq: Two times a day (BID) | ORAL | Status: DC
  Administered 2023-07-26 – 2023-08-07 (×25): 60 mg via ORAL
  Filled 2023-07-26 (×25): qty 1

## 2023-07-26 NOTE — Evaluation (Signed)
 Physical Therapy Evaluation Patient Details Name: Terri Ayala MRN: [id number] DOB: Jan 23, 1956 Today's Date: 07/26/2023  History of Present Illness  68 y.o. female adm 5/26 with medical history significant of depression, anxiety, PTSD, heavy alcohol  use, hypertension neuropathy migraines.  Presented after a fall at home down 5 steps.  Imaging reveals Acute displaced fracture of the posterior left eighth rib.   Clinical Impression  Terri Ayala is 68 y.o. female admitted with above HPI and diagnosis. Patient is currently limited by functional impairments below (see PT problem list). Patient lives alone per chart review and has a PCA daily. Pt not providing history today but per chart utilizes RW for mobility at baseline. Currently pt greatly limited by pain in Lt side from rib fx and impaired cognition and lethargy (suspect due to medication). Pt requiring max assist for all bed mobility and has not been able to progress to EOB or OOB activity during this stay. Patient will benefit from continued skilled PT interventions to address impairments and progress independence with mobility. Patient will benefit from continued inpatient follow up therapy, <3 hours/day. Acute PT will follow and progress as able.         If plan is discharge home, recommend the following: Two people to help with walking and/or transfers;Two people to help with bathing/dressing/bathroom;Assistance with cooking/housework;Assistance with feeding;Direct supervision/assist for medications management;Assist for transportation;Help with stairs or ramp for entrance;Supervision due to cognitive status   Can travel by private vehicle   No    Equipment Recommendations None recommended by PT (tbd)  Recommendations for Other Services       Functional Status Assessment Patient has had a recent decline in their functional status and/or demonstrates limited ability to make significant improvements in function in a reasonable and  predictable amount of time     Precautions / Restrictions Precautions Precautions: Fall Recall of Precautions/Restrictions: Intact Precaution/Restrictions Comments: ETOH Withdrawals Restrictions Weight Bearing Restrictions Per Provider Order: No      Mobility  Bed Mobility Overal bed mobility: Needs Assistance Bed Mobility: Rolling Rolling: Mod assist, Max assist         General bed mobility comments: mod assist to roll Rt and max assist to roll Lt due to pain. attempted to initiate supine<>sit but pt more lethargic as session went on. rolling completed for linen change due to soiled with urine.    Transfers                   General transfer comment: deferred due to pain and lethargy    Ambulation/Gait                  Stairs            Wheelchair Mobility     Tilt Bed    Modified Rankin (Stroke Patients Only)       Balance                                             Pertinent Vitals/Pain Pain Assessment Pain Assessment: Faces Faces Pain Scale: Hurts whole lot Pain Location: L flank with bed mobility Pain Descriptors / Indicators: Grimacing, Guarding, Sharp Pain Intervention(s): Limited activity within patient's tolerance, Monitored during session, Repositioned    Home Living Family/patient expects to be discharged to:: Private residence Living Arrangements: Alone Available Help at Discharge: Personal care attendant Type of Home:  House Home Access: Stairs to enter Entrance Stairs-Rails: Doctor, general practice of Steps: 1   Home Layout: 1/2 bath on main level;Bed/bath upstairs;Able to live on main level with bedroom/bathroom Home Equipment: Rolling Walker (2 wheels);Cane - single point;BSC/3in1;Shower seat      Prior Function Prior Level of Function : Patient poor historian/Family not available             Mobility Comments: Has a 2WRW availale, unclear if she uses it consistently. ADLs  Comments: PCA daily that assists with iADL, meals, community mobility and assists with her pet.  Patient stating she performs her own sinkbath.     Extremity/Trunk Assessment             Cervical / Trunk Assessment Cervical / Trunk Assessment: Kyphotic;Other exceptions Cervical / Trunk Exceptions: rib fracture  Communication   Communication Communication: Impaired Factors Affecting Communication: Hearing impaired    Cognition Arousal: Lethargic Behavior During Therapy: Flat affect   PT - Cognitive impairments: Orientation, Awareness, Memory, Attention, Initiation, Sequencing, Safety/Judgement, Problem solving, No family/caregiver present to determine baseline   Orientation impairments: Time, Situation, Place (needed cues to get, able to recall place 2 minutes later, unable to recall year)                   PT - Cognition Comments: alertness decreased throughout session due to fatigue and medication. Following commands: Impaired       Cueing Cueing Techniques: Verbal cues, Gestural cues, Tactile cues     General Comments      Exercises     Assessment/Plan    PT Assessment Patient needs continued PT services  PT Problem List Decreased strength;Decreased range of motion;Decreased activity tolerance;Decreased balance;Decreased mobility;Decreased coordination;Decreased cognition;Decreased knowledge of use of DME;Decreased safety awareness;Decreased knowledge of precautions;Pain       PT Treatment Interventions DME instruction;Gait training;Stair training;Functional mobility training;Therapeutic activities;Therapeutic exercise;Balance training;Neuromuscular re-education;Cognitive remediation;Patient/family education;Wheelchair mobility training    PT Goals (Current goals can be found in the Care Plan section)  Acute Rehab PT Goals Patient Stated Goal: none stated PT Goal Formulation: Patient unable to participate in goal setting Time For Goal Achievement:  08/09/23 Potential to Achieve Goals: Fair    Frequency Min 2X/week     Co-evaluation               AM-PAC PT "6 Clicks" Mobility  Outcome Measure Help needed turning from your back to your side while in a flat bed without using bedrails?: Total Help needed moving from lying on your back to sitting on the side of a flat bed without using bedrails?: Total Help needed moving to and from a bed to a chair (including a wheelchair)?: Total Help needed standing up from a chair using your arms (e.g., wheelchair or bedside chair)?: Total Help needed to walk in hospital room?: Total Help needed climbing 3-5 steps with a railing? : Total 6 Click Score: 6    End of Session   Activity Tolerance: Patient limited by pain;Patient limited by lethargy;Patient limited by fatigue Patient left: in bed;with call bell/phone within reach;with bed alarm set Nurse Communication: Mobility status PT Visit Diagnosis: Other abnormalities of gait and mobility (R26.89);Muscle weakness (generalized) (M62.81);Difficulty in walking, not elsewhere classified (R26.2);Unsteadiness on feet (R26.81);Pain;History of falling (Z91.81) Pain - Right/Left: Left Pain - part of body:  (side/ribs)    Time: 4098-1191 PT Time Calculation (min) (ACUTE ONLY): 18 min   Charges:   PT Evaluation $PT Eval Moderate Complexity: 1 Mod  PT General Charges $$ ACUTE PT VISIT: 1 Visit         Tish Forge, DPT Acute Rehabilitation Services Office [phone]  07/26/23 12:26 PM

## 2023-07-26 NOTE — Evaluation (Signed)
 Clinical/Bedside Swallow Evaluation Patient Details  Name: Terri Ayala MRN: [id number] Date of Birth: [birth year]  Today's Date: 07/26/2023 Time: SLP Start Time (ACUTE ONLY): 1419 SLP Stop Time (ACUTE ONLY): 1428 SLP Time Calculation (min) (ACUTE ONLY): 9 min  Past Medical History:  Past Medical History:  Diagnosis Date   Alcohol  withdrawal (HCC) 11/2017; 01/04/2018   Alcoholism (HCC)    Anxiety    Arthritis    "hands" (01/04/2018)   ASYMPTOMATIC POSTMENOPAUSAL STATUS 08/17/2009   Chronic lower back pain    Depression    DYSPHAGIA 06/19/2007   Fatty liver, alcoholic    GERD (gastroesophageal reflux disease)    GOITER, MULTINODULAR 08/17/2009   History of colonic polyps 03/25/2020   Hyperlipidemia    Hypertension    Migraines    "not sure what triggers them; I'll have 1 q couple weeks or more; come 2 days in a row when they come" (01/04/2018)   Mitral valve prolapse    OSTEOPOROSIS 08/17/2009   PONV (postoperative nausea and vomiting)    Supraventricular tachycardia (HCC)    Past Surgical History:  Past Surgical History:  Procedure Laterality Date   BREAST BIOPSY Right    "benign"   CATARACT EXTRACTION W/ INTRAOCULAR LENS  IMPLANT, BILATERAL     CERVICAL CONE BIOPSY  2000s   CERVIX LESION DESTRUCTION  1991   "dysplasia; lesions"   CHOLECYSTECTOMY N/A 03/14/2013   Procedure: LAPAROSCOPIC CHOLECYSTECTOMY WITH ATTEMPTED INTRAOPERATIVE CHOLANGIOGRAM;  Surgeon: Rogena Class, MD;  Location: [facility] OR;  Service: General;  Laterality: N/A;   HARDWARE REMOVAL Left 07/22/2021   Procedure: HARDWARE REMOVAL;  Surgeon: Hardy Lia, MD;  Location: [facility] OR;  Service: Orthopedics;  Laterality: Left;   laporoscopic abdominal surgery     "for endometriosis"   LEFT HEART CATH AND CORONARY ANGIOGRAPHY N/A 05/07/2018   Procedure: LEFT HEART CATH AND CORONARY ANGIOGRAPHY;  Surgeon: Arty Binning, MD;  Location: [facility] INVASIVE CV [facility];  Service: Cardiovascular;  Laterality: N/A;   ORIF ANKLE  FRACTURE Left 02/11/2021   Procedure: OPEN REDUCTION INTERNAL FIXATION (ORIF) ANKLE FRACTURE;  Surgeon: Hardy Lia, MD;  Location: [facility] OR;  Service: Orthopedics;  Laterality: Left;   TONSILLECTOMY     HPI:  68 y.o. female adm 5/26 with medical history significant of depression, anxiety, PTSD, heavy alcohol  use, hypertension neuropathy migraines.  Presented after a fall at home down 5 steps.  Imaging reveals Acute displaced fracture of the posterior left eighth rib.    Assessment / Plan / Recommendation  Clinical Impression  Limited assessment pt lethargic and uncooperative. Pt woke up and verbally responded but did not want to be positioned out of sidelying, likely due to rib fx. Accepted one sip of water  but would not otherwise participate. DIdnt take applesauce when offered. Has a plate of food at bedside she is not interested in. Ultimately pt closed her eyes but stopped responding. Will f/u tomorrow to assess further SLP Visit Diagnosis: Dysphagia, unspecified (R13.10)    Aspiration Risk  Risk for inadequate nutrition/hydration    Diet Recommendation Thin liquid    Liquid Administration via: Cup;Straw Medication Administration: Whole meds with liquid    Other  Recommendations      Recommendations for follow up therapy are one component of a multi-disciplinary discharge planning process, led by the attending physician.  Recommendations may be updated based on patient status, additional functional criteria and insurance authorization.  Follow up Recommendations Follow physician's recommendations for discharge plan and follow up therapies  Assistance Recommended at Discharge    Functional Status Assessment    Frequency and Duration min 2x/week  2 weeks       Prognosis Prognosis for improved oropharyngeal function: Good      Swallow Study   General HPI: 68 y.o. female adm 5/26 with medical history significant of depression, anxiety, PTSD, heavy alcohol  use, hypertension  neuropathy migraines.  Presented after a fall at home down 5 steps.  Imaging reveals Acute displaced fracture of the posterior left eighth rib. Type of Study: Bedside Swallow Evaluation Diet Prior to this Study: NPO Temperature Spikes Noted: No Respiratory Status: Room air History of Recent Intubation: No Behavior/Cognition: Uncooperative;Doesn't follow directions Oral Cavity Assessment: Within Functional Limits Oral Care Completed by SLP: No Oral Cavity - Dentition: Adequate natural dentition Vision: Impaired for self-feeding Self-Feeding Abilities: Refused PO Patient Positioning: Postural control interferes with function (fetal position, sidelying) Baseline Vocal Quality: Normal Volitional Cough: Cognitively unable to elicit Volitional Swallow: Unable to elicit    Oral/Motor/Sensory Function Overall Oral Motor/Sensory Function: Other (comment) (doesnt follow command)   Ice Chips Ice chips: Not tested   Thin Liquid Thin Liquid: Within functional limits Presentation: Straw    Nectar Thick Nectar Thick Liquid: Not tested   Honey Thick Honey Thick Liquid: Not tested   Puree Puree: Not tested   Solid     Solid: Not tested      Doryce Mcgregory, Hardin Leys 07/26/2023,2:51 PM

## 2023-07-26 NOTE — Progress Notes (Incomplete)
 Initial Nutrition Assessment  DOCUMENTATION CODES:  Non-severe (moderate) malnutrition in context of social or environmental circumstances  INTERVENTION:  Ensure Enlive po BID, each supplement provides 350 kcal and 20 grams of protein. Magic cup TID with meals, each supplement provides 290 kcal and 9 grams of protein Continue MVI with minerals daily Automatic meal trays to avoid missed meals Assistance with meals to optimize intake Consider scheduled bowel regimen if no BM in next day or two Collect new weight to assess trend this admission  NUTRITION DIAGNOSIS:  Moderate Malnutrition related to social / environmental circumstances (EtOH use) as evidenced by moderate muscle depletion, moderate fat depletion.  GOAL:  Patient will meet greater than or equal to 90% of their needs  MONITOR:  PO intake, Supplement acceptance, Labs, Skin  REASON FOR ASSESSMENT:  Consult Assessment of nutrition requirement/status  ASSESSMENT:  Pt with PMH significant for: depression, anxiety, PTSD, EtOH use and HTN. Presented to ED s/p fall. Attributes this to heavy drinking.  Continues with some confusion and tremors today. Requiring Ativan .  Average Meal Intake 5/28: 0-15% x2 documented meals  Pt sleeping during bedside visit. Difficult to rouse, but unable to report on recent nutrition-related history. No family at bedside. Documented intake negligible and administration of Ativan  may inhibit intake. Speech in earlier in the day to see patient. Document she appears interested in PO intake, however will need assistance with meals 2/2 mentation as a barrier. Will monitor for improvement upon resolution of withdrawal symptoms. Will also add Magic Cup to trays and Ensure between meals to augment intake.  Admit/Current Weight: 54.4kg  Per chart review, only two months of history available. No significant weight changes observed. She is unable to report UBW. No edema on exam. Bowels have not moved since  5/25. Consider scheduling bowel regimen if this does not resolve in next 1-2 days. Miralax  currently ordered and being administered.  Net IO Since Admission: -110 mL [07/27/23 1401]   Thiamine  and folate continue. MVI in place. Sodium low. Potassium and phosphorus have been supplemented. Continue to monitor electrolytes.  Meds: folic acid , thiamine , MVI, pantoprazole , K PHOS , Miralax   Labs: Na+ 136>134 (L) K+ 3.6 (wdl) PHOS 1.9>3.0 (wdl) CBGs 102-117 x24 hours   NUTRITION - FOCUSED PHYSICAL EXAM:  Flowsheet Row Most Recent Value  Orbital Region Mild depletion  Upper Arm Region Moderate depletion  Thoracic and Lumbar Region Moderate depletion  Buccal Region Mild depletion  Temple Region Moderate depletion  Clavicle Bone Region Moderate depletion  Clavicle and Acromion Bone Region Mild depletion  Scapular Bone Region Moderate depletion  Dorsal Hand No depletion  Patellar Region Mild depletion  Anterior Thigh Region Mild depletion  Posterior Calf Region Moderate depletion  Edema (RD Assessment) None  Hair Reviewed  Eyes Reviewed  Mouth Reviewed  Skin Reviewed  Nails Reviewed    Diet Order:   Diet Order             Diet Heart Room service appropriate? Yes; Fluid consistency: Thin  Diet effective now             EDUCATION NEEDS:   Not appropriate for education at this time  Skin:  Skin Assessment: Reviewed RN Assessment  Last BM:  5/25  Height:  Ht Readings from Last 1 Encounters:  07/24/23 5\' 2"  (1.575 m)   Weight:  Wt Readings from Last 1 Encounters:  07/24/23 54.4 kg    Ideal Body Weight:  50 kg  BMI:  Body mass index is 21.95 kg/m.  Estimated Nutritional Needs:   Kcal:  1600-1800kcals  Protein:  75-90g  Fluid:  >1.5L/day  Con Decant MS, RD, LDN Registered Dietitian Clinical Nutrition RD Inpatient Contact Info in Amion

## 2023-07-26 NOTE — Progress Notes (Addendum)
   07/26/23 1429  TOC Brief Assessment  Insurance and Status Reviewed  Patient has primary care physician Yes Lawanda Prayer, [place], [place])  Home environment has been reviewed From Home  Prior level of function: patient lives at home, has a PCA daily for iADL, meals, and community mobility.  The patient stating she completes her own ADL via sink baths.  Currently the patient is limited by pain associated with her new rib fracture  Prior/Current Home Services Current home services (PCA Daily)  Social Drivers of Health Review SDOH reviewed interventions complete  Readmission risk has been reviewed Yes (13%)  Transition of care needs no transition of care needs at this time   Patient has been recommended SNF but most likely will return home . [facility] has accepted patient back if she DC's to home  Will need to update AVS   TOC will continue to follow patient for any discharge needs

## 2023-07-26 NOTE — Progress Notes (Signed)
 [facility] PROGRESS NOTE    Progress Note  Terri Ayala  HYQ:[id number] DOB: Aug 20, 1955 DOA: 07/24/2023 PCP: Noreene Bearded, PA     Brief Narrative:   Terri Ayala is an 68 y.o. female past medical history significant for depression anxiety PTSD, alcohol  use and hypertension comes to the ED after a fall, he relates he has been drinking heavily before this denies hitting her head or loss of consciousness.   Assessment/Plan:   Alcohol  dependence with withdrawals: Start on thiamine  and folate. Full resuscitated started on the CIWA protocol she remains at withdrawal, continue with full CIWA protocol, she is tachypneic, tachycardic with tremors  Acute posterior left eighth rib fracture: Appreciated on CT she also has old healed rib fractures. Continue narcotics for pain. Started on a bowel regimen. Alcoholic peripheral neuropathy: Continue Neurontin . Encouraged to use incentive spirometer, discussed with staff to educate her about incentive spirometer when awake and compliant  Benign essential hypertension: Continue Coreg  twice a day.  GERD: Continue PPI.  Hepatic steatosis In the setting of alcohol  abuse noted.  Prolonged QTc: Try to potassium greater than 4 magnesium  greater than 2. First is around 3.  Protein-calorie malnutrition, severe (HCC): Noted.  Hypophosphatemia - Repleted   DVT prophylaxis: Lovenox  Family Communication:none Status is: Inpatient Remains inpatient appropriate because: Alcohol  abuse with withdrawals   Code Status:     Code Status Orders  (From admission, onward)           Start     Ordered   07/24/23 1908  Full code  Continuous       Question:  By:  Answer:  Consent: discussion documented in EHR   07/24/23 1908           Code Status History     Date Active Date Inactive Code Status Order ID Comments User Context   02/16/2022 0018 02/25/2022 1553 Full Code [id number]  Bary Boss, DO Inpatient   01/25/2022  2250 01/29/2022 1848 Full Code [id number]  Roxana Copier, DO ED   01/25/2022 1132 01/25/2022 2250 Full Code [id number]  Lorra Rosella, PA-C ED   06/08/2021 2002 06/11/2021 1758 Full Code [id number]  Sabas Cradle, MD ED   02/11/2021 0223 02/16/2021 2145 Full Code [id number]  Chotiner, Pauline Bos, MD ED   11/21/2020 0917 11/23/2020 1732 Full Code [id number]  Lorita Rosa, MD ED   11/20/2020 2154 11/21/2020 0917 Full Code [id number]  Coretta Dexter, PA ED   09/26/2020 1726 10/01/2020 0452 Full Code [id number]  Marine Sia, MD Inpatient   03/16/2019 2057 03/21/2019 2025 Full Code [id number]  Juliette Oh, MD ED   03/16/2019 1257 03/16/2019 1258 Full Code [id number]  Coretta Dexter, PA ED   02/06/2019 0150 02/08/2019 2211 Full Code [id number]  Vanita Gens, MD ED   05/05/2018 0311 05/08/2018 1507 Full Code [id number]  Dea Evert, DO ED   01/04/2018 1654 01/10/2018 1754 Full Code [id number]  Lorita Rosa, MD ED   12/08/2017 0509 12/10/2017 1937 Full Code [id number]  Lena Qualia, MD ED   11/06/2017 2353 11/09/2017 1602 Full Code [id number]  Pati Bonine, MD ED   04/03/2014 1352 04/07/2014 2119 Full Code [id number]  Alfreida Inches, NP Inpatient   04/03/2014 1352 04/03/2014 1352 Full Code [id number]  Alfreida Inches, NP Inpatient   04/02/2014 2218 04/03/2014 1352 Full Code [id number]  Marlo Simpler ED   06/21/2013 1333 06/21/2013 2103 Full Code [id number]  Mark Sil, MD Inpatient  06/17/2013 1958 06/18/2013 0342 Full Code [id number]  Mee Spillers., MD ED   03/14/2013 1235 03/26/2013 1917 Full Code [id number]  Oza Blumenthal, MD Inpatient         IV Access:   Peripheral IV   Procedures and diagnostic studies:   ECHOCARDIOGRAM COMPLETE Result Date: 07/25/2023    ECHOCARDIOGRAM REPORT   Patient Name:   Terri Ayala Date of Exam: 07/25/2023 Medical Rec #:  [id number]    Height:       62.0 in Accession #:    [id number]   Weight:       120.0 lb Date of Birth:  12-12-1955     BSA:          1.539 m Patient Age:    67 years     BP:           123/73 mmHg Patient Gender: F            HR:           109 bpm. Exam Location:  Inpatient Procedure: 2D Echo, Cardiac Doppler and Color Doppler (Both Spectral and Color            Flow Doppler were utilized during procedure). Indications:    Murmur  History:        Patient has prior history of Echocardiogram examinations. Risk                 Factors:Former Smoker, Hypertension and ETOH Abuse.  Sonographer:    Willey Harrier Referring Phys: [id number] ANASTASSIA DOUTOVA IMPRESSIONS  1. Left ventricular ejection fraction, by estimation, is 70 to 75%. The left ventricle has hyperdynamic function. The left ventricle has no regional wall motion abnormalities. Indeterminate diastolic filling due to E-A fusion.  2. Right ventricular systolic function is normal. The right ventricular size is normal. There is normal pulmonary artery systolic pressure. The estimated right ventricular systolic pressure is 19.0 mmHg.  3. The mitral valve is grossly normal. Trivial mitral valve regurgitation. No evidence of mitral stenosis.  4. The aortic valve is tricuspid. Aortic valve regurgitation is not visualized. No aortic stenosis is present.  5. The inferior vena cava is normal in size with greater than 50% respiratory variability, suggesting right atrial pressure of 3 mmHg. Conclusion(s)/Recommendation(s): Normal biventricular function without evidence of hemodynamically significant valvular heart disease. FINDINGS  Left Ventricle: Left ventricular ejection fraction, by estimation, is 70 to 75%. The left ventricle has hyperdynamic function. The left ventricle has no regional wall motion abnormalities. The left ventricular internal cavity size was normal in size. There is no left ventricular hypertrophy. Indeterminate diastolic filling due to E-A fusion. Right Ventricle: The right ventricular size is normal. No increase in right ventricular wall thickness. Right ventricular  systolic function is normal. There is normal pulmonary artery systolic pressure. The tricuspid regurgitant velocity is 2.00 m/s, and  with an assumed right atrial pressure of 3 mmHg, the estimated right ventricular systolic pressure is 19.0 mmHg. Left Atrium: Left atrial size was normal in size. Right Atrium: Right atrial size was normal in size. Pericardium: There is no evidence of pericardial effusion. Mitral Valve: The mitral valve is grossly normal. Trivial mitral valve regurgitation. No evidence of mitral valve stenosis. MV peak gradient, 6.4 mmHg. The mean mitral valve gradient is 3.0 mmHg. Tricuspid Valve: The tricuspid valve is grossly normal. Tricuspid valve regurgitation is mild . No evidence of tricuspid stenosis. Aortic Valve: The aortic valve is tricuspid. Aortic valve regurgitation is not visualized. No  aortic stenosis is present. Aortic valve peak gradient measures 10.2 mmHg. Pulmonic Valve: The pulmonic valve was grossly normal. Pulmonic valve regurgitation is not visualized. No evidence of pulmonic stenosis. Aorta: The aortic root and ascending aorta are structurally normal, with no evidence of dilitation. Venous: The inferior vena cava is normal in size with greater than 50% respiratory variability, suggesting right atrial pressure of 3 mmHg. IAS/Shunts: The atrial septum is grossly normal.  LEFT VENTRICLE PLAX 2D LVIDd:         4.30 cm   Diastology LVIDs:         2.80 cm   LV e' medial:    8.16 cm/s LV PW:         0.90 cm   LV E/e' medial:  9.9 LV IVS:        1.10 cm   LV e' lateral:   11.50 cm/s LVOT diam:     1.90 cm   LV E/e' lateral: 7.0 LV SV:         64 LV SV Index:   41 LVOT Area:     2.84 cm  RIGHT VENTRICLE             IVC RV Basal diam:  3.50 cm     IVC diam: 1.15 cm RV S prime:     13.60 cm/s TAPSE (M-mode): 2.4 cm LEFT ATRIUM             Index        RIGHT ATRIUM           Index LA Vol (A2C):   42.9 ml 27.88 ml/m  RA Area:     11.20 cm LA Vol (A4C):   22.2 ml 14.43 ml/m  RA Volume:    22.80 ml  14.82 ml/m LA Biplane Vol: 31.9 ml 20.73 ml/m  AORTIC VALVE AV Area (Vmax): 2.37 cm AV Vmax:        160.00 cm/s AV Peak Grad:   10.2 mmHg LVOT Vmax:      134.00 cm/s LVOT Vmean:     91.600 cm/s LVOT VTI:       0.225 m  AORTA Ao Root diam: 3.10 cm Ao Asc diam:  3.50 cm MITRAL VALVE                TRICUSPID VALVE MV Area (PHT): 5.54 cm     TR Peak grad:   16.0 mmHg MV Area VTI:   3.80 cm     TR Vmax:        200.00 cm/s MV Peak grad:  6.4 mmHg MV Mean grad:  3.0 mmHg     SHUNTS MV Vmax:       1.26 m/s     Systemic VTI:  0.22 m MV Vmean:      83.8 cm/s    Systemic Diam: 1.90 cm MV Decel Time: 137 msec MV E velocity: 81.00 cm/s MV A velocity: 121.00 cm/s MV E/A ratio:  0.67 Jackquelyn Mass MD Electronically signed by Jackquelyn Mass MD Signature Date/Time: 07/25/2023/12:59:42 PM    Final    CT T-SPINE NO CHARGE Result Date: 07/24/2023 EXAM: CT THORACIC SPINE WITHOUT CONTRAST 07/24/2023 12:37:00 PM TECHNIQUE: CT of the thoracic spine was performed without the administration of intravenous contrast. Multiplanar reformatted images are provided for review. Automated exposure control, iterative reconstruction, and/or weight based adjustment of the mA/kV was utilized to reduce the radiation dose to as low as reasonably achievable. COMPARISON: None available. CLINICAL HISTORY: Head trauma, moderate-severe;  Polytrauma, blunt. FINDINGS: BONES AND ALIGNMENT: There is normal alignment of the spine. The vertebral body heights are maintained. No osseous destructive lesion is seen. An acute fracture of the posterior left eighth rib is displaced 1.5 cm the shaft width. Remote right fifth through 10th rib fractures are present. A remote posteromedial left fifth rib fracture is present. DEGENERATIVE CHANGES: No gross spinal canal stenosis or bony neural foraminal narrowing of the thoracic spine. SOFT TISSUES: No paraspinal mass or hematoma. LIMITED CHEST: Limited images of the chest demonstrate no pneumothorax. Adjacent  pleural thickening is present. IMPRESSION: 1. Acute displaced fracture of the posterior left eighth rib with adjacent pleural thickening. No pneumothorax. 2. Remote right fifth through 10th rib fractures and remote posteromedial left fifth rib fracture. Electronically signed by: Audree Leas MD 07/24/2023 01:01 PM EDT RP Workstation: [id number]   CT HEAD WO CONTRAST Result Date: 07/24/2023 CLINICAL DATA:  Head trauma, moderate-severe Fall down stairs at home. EXAM: CT HEAD WITHOUT CONTRAST TECHNIQUE: Contiguous axial images were obtained from the base of the skull through the vertex without intravenous contrast. RADIATION DOSE REDUCTION: This exam was performed according to the departmental dose-optimization program which includes automated exposure control, adjustment of the mA and/or kV according to patient size and/or use of iterative reconstruction technique. COMPARISON:  Head CT 02/17/2022 FINDINGS: Brain: No intracranial hemorrhage, mass effect, or midline shift. Stable generalized atrophy. No hydrocephalus. The basilar cisterns are patent. Mild chronic small vessel ischemia, similar to prior exam. No evidence of territorial infarct or acute ischemia. Chronic left cerebellar calcification. No extra-axial or intracranial fluid collection. Vascular: No hyperdense vessel or unexpected calcification. Skull: No fracture or focal lesion. Sinuses/Orbits: Paranasal sinuses and mastoid air cells are clear. The visualized orbits are unremarkable. Other: No confluent scalp hematoma. IMPRESSION: 1. No acute intracranial abnormality. No skull fracture. 2. Stable atrophy and chronic small vessel ischemia. Electronically Signed   By: Chadwick Colonel M.D.   On: 07/24/2023 12:59   CT CHEST ABDOMEN PELVIS W CONTRAST Result Date: 07/24/2023 EXAM: CT CHEST, ABDOMEN AND PELVIS WITH CONTRAST 07/24/2023 12:37:00 PM TECHNIQUE: CT of the chest, abdomen and pelvis was performed with the administration of intravenous  contrast. Multiplanar reformatted images are provided for review. Automated exposure control, iterative reconstruction, and/or weight based adjustment of the mA/kV was utilized to reduce the radiation dose to as low as reasonably achievable. COMPARISON: Rib radiographs 09/13/2022. Chest and rib radiographs 09/01/2022. CT angio chest 09/27/2020. CT abdomen and pelvis 09/26/2020. CLINICAL HISTORY: Marvell Slider down steps back pain and left rib pain. FINDINGS: CHEST: MEDIASTINUM: Heart and pericardium are unremarkable. The central airways are clear. A small hiatal hernia is present. THORACIC LYMPH NODES: No mediastinal, hilar or axillary lymphadenopathy. LUNGS AND PLEURA: Mild-dependent atelectasis is present in both lungs. Pleural thickening is present. No pneumothorax is present. ABDOMEN AND PELVIS: LIVER: Mild diffuse fatty infiltration of the liver is present. GALLBLADDER AND BILE DUCTS: Cholecystectomy noted. SPLEEN: No acute abnormality. PANCREAS: No acute abnormality. ADRENAL GLANDS: No acute abnormality. KIDNEYS, URETERS AND BLADDER: No stones in the kidneys or ureters. No hydronephrosis. No perinephric or periureteral stranding. Urinary bladder is unremarkable. GI AND BOWEL: Stomach demonstrates no acute abnormality. There is no bowel obstruction. No appendicitis. REPRODUCTIVE ORGANS: No acute abnormality. PERITONEUM AND RETROPERITONEUM: No ascites. No free air. VASCULATURE: Mild atherosclerotic changes are present within the abdominal aorta and branch vessels without aneurysm. ABDOMINAL AND PELVIS LYMPH NODES: No lymphadenopathy. REPRODUCTIVE ORGANS: No acute abnormality. BONES AND SOFT TISSUES: Remote healed fractures are present in  the right fifth through tenth posterolateral ribs. A healed posterior medial left fifth rib fracture is noted. present. An acute posterior left eighth rib fracture is displaced by half the shaft width. IMPRESSION: 1. Acute posterior left eighth rib fracture, displaced by half the shaft  width. 2. Remote healed fractures in the right fifth through tenth posterolateral ribs and a healed posterior medial left fifth rib fracture. 3. No other acute trauma to the chest, abdomen, or pelvis Electronically signed by: Audree Leas MD 07/24/2023 12:59 PM EDT RP Workstation: [id number]   CT CERVICAL SPINE WO CONTRAST Result Date: 07/24/2023 CLINICAL DATA:  Blunt trauma.  Fall down steps at home. EXAM: CT CERVICAL SPINE WITHOUT CONTRAST TECHNIQUE: Multidetector CT imaging of the cervical spine was performed without intravenous contrast. Multiplanar CT image reconstructions were also generated. RADIATION DOSE REDUCTION: This exam was performed according to the departmental dose-optimization program which includes automated exposure control, adjustment of the mA and/or kV according to patient size and/or use of iterative reconstruction technique. COMPARISON:  CT cervical spine 11/26/2021 FINDINGS: Alignment: Trace anterolisthesis of C5 on C6. no traumatic subluxation. Skull base and vertebrae: No acute fracture. Vertebral body heights are maintained. The dens and skull base are intact. None fusion posterior arch of C1. Soft tissues and spinal canal: No prevertebral fluid or swelling. No visible canal hematoma. Disc levels: Disc space narrowing and spurring C5-C6 and C6-C7. There is multilevel facet hypertrophy and facet ankylosis. No high-grade canal stenosis. Upper chest: Assessed on concurrent chest CT, reported separately. Bilateral thyroid  nodules. This has been evaluated on previous imaging. (ref: J Am Coll Radiol. 2015 Feb;12(2): 143-50).Reference thyroid  ultrasound 02/11/2021 Other: Carotid calcifications. IMPRESSION: 1. No acute fracture or subluxation of the cervical spine. 2. Multilevel degenerative disc disease and facet hypertrophy. Electronically Signed   By: Chadwick Colonel M.D.   On: 07/24/2023 12:55     Medical Consultants:   None.   Subjective:    Marigene Shoulder denies any  complaints today, but in significant withdrawals with tremors despite receiving Ativan  already this a.m.  Objective:    Vitals:   07/26/23 0500 07/26/23 0600 07/26/23 0700 07/26/23 0742  BP:  (!) 150/102 (!) 149/96 (!) 149/96  Pulse: (!) 108 (!) 104 (!) 107 (!) 206  Resp:    18  Temp:    98.6 F (37 C)  TempSrc:    Axillary  SpO2:    96%  Weight:      Height:       SpO2: 96 %   Intake/Output Summary (Last 24 hours) at 07/26/2023 1208 Last data filed at 07/26/2023 1000 Gross per 24 hour  Intake 0 ml  Output 350 ml  Net -350 ml   Filed Weights   07/24/23 1002  Weight: 54.4 kg    Exam: Awake Alert, Oriented X 2, restless, confused Symmetrical Chest wall movement, Good air movement bilaterally, CTAB RRR,No Gallops,Rubs or new Murmurs, No Parasternal Heave +ve B.Sounds, Abd Soft, No tenderness, No rebound - guarding or rigidity. No Cyanosis, Clubbing or edema, No new Rash or bruise      Data Reviewed:    Labs: Basic Metabolic Panel: Recent Labs  Lab 07/24/23 1049 07/24/23 1055 07/24/23 2250 07/25/23 0500 07/26/23 0756  NA 135 137 137 137 136  K 4.1 3.8 3.9 3.7 4.3  CL 102 102 105 104 103  CO2 21*  --  22 22 21*  GLUCOSE 106* 104* 103* 95 117*  BUN 15 16 15 16 10   CREATININE 0.77  0.90 0.96 0.84 0.67  CALCIUM  8.3*  --  8.1* 7.9* 8.9  MG  --   --  1.6* 1.5* 2.1  PHOS  --   --  3.8 2.9 1.9*   GFR Estimated Creatinine Clearance: 53.2 mL/min (by C-G formula based on SCr of 0.67 mg/dL). Liver Function Tests: Recent Labs  Lab 07/24/23 2250 07/25/23 0500  AST 42* 36  ALT 15 14  ALKPHOS 64 58  BILITOT 0.8 1.0  PROT 6.3* 5.9*  ALBUMIN 3.4* 3.1*   No results for input(s): "LIPASE", "AMYLASE" in the last 168 hours. Recent Labs  Lab 07/24/23 2255  AMMONIA 25   Coagulation profile Recent Labs  Lab 07/24/23 2250  INR 1.0   COVID-19 Labs  Recent Labs    07/24/23 2250  FERRITIN 69    Lab Results  Component Value Date   SARSCOV2NAA NEGATIVE  02/15/2021   SARSCOV2NAA NEGATIVE 02/10/2021   SARSCOV2NAA NEGATIVE 09/26/2020   SARSCOV2NAA NEGATIVE 03/16/2019    CBC: Recent Labs  Lab 07/24/23 1049 07/24/23 1055 07/25/23 0500 07/26/23 0756  WBC 8.8  --  5.9 7.8  NEUTROABS 6.6  --   --   --   HGB 11.6* 12.6 10.8* 12.3  HCT 34.9* 37.0 33.3* 36.5  MCV 92.3  --  94.6 91.0  PLT 178  --  150 175   Cardiac Enzymes: Recent Labs  Lab 07/24/23 2250  CKTOTAL 64   BNP (last 3 results) No results for input(s): "PROBNP" in the last 8760 hours. CBG: No results for input(s): "GLUCAP" in the last 168 hours. D-Dimer: No results for input(s): "DDIMER" in the last 72 hours. Hgb A1c: No results for input(s): "HGBA1C" in the last 72 hours. Lipid Profile: No results for input(s): "CHOL", "HDL", "LDLCALC", "TRIG", "CHOLHDL", "LDLDIRECT" in the last 72 hours. Thyroid  function studies: Recent Labs    07/24/23 2250  TSH 6.915*   Anemia work up: Recent Labs    07/24/23 2250 07/25/23 0500 07/25/23 1645  VITAMINB12  --   --  605  FOLATE  --  27.4  --   FERRITIN 69  --   --   TIBC 358  --   --   IRON 261*  --   --   RETICCTPCT 2.7  --   --    Sepsis Labs: Recent Labs  Lab 07/24/23 1049 07/25/23 0500 07/26/23 0756  WBC 8.8 5.9 7.8   Microbiology No results found for this or any previous visit (from the past 240 hours).   Medications:    baclofen   10 mg Oral TID   buPROPion   150 mg Oral BH-q7a   busPIRone   15 mg Oral TID   carvedilol   6.25 mg Oral BID WC   DULoxetine   60 mg Oral BID   folic acid   1 mg Oral Daily   gabapentin   300 mg Oral BID   multivitamin with minerals  1 tablet Oral Daily   pantoprazole   40 mg Oral BID   polyethylene glycol  17 g Oral BID   thiamine   100 mg Oral Daily   Or   thiamine   100 mg Intravenous Daily   Continuous Infusions:    LOS: 2 days   Kenly Henckel  [facility]  07/26/2023, 12:08 PM

## 2023-07-27 ENCOUNTER — Telehealth (HOSPITAL_COMMUNITY): Admitting: Medical

## 2023-07-27 DIAGNOSIS — S2232XA Fracture of one rib, left side, initial encounter for closed fracture: Secondary | ICD-10-CM | POA: Diagnosis not present

## 2023-07-27 DIAGNOSIS — F10239 Alcohol dependence with withdrawal, unspecified: Secondary | ICD-10-CM | POA: Diagnosis not present

## 2023-07-27 LAB — CBC
HCT: 37.7 % (ref 36.0–46.0)
Hemoglobin: 12.5 g/dL (ref 12.0–15.0)
MCH: 30.3 pg (ref 26.0–34.0)
MCHC: 33.2 g/dL (ref 30.0–36.0)
MCV: 91.3 fL (ref 80.0–100.0)
Platelets: 193 10*3/uL (ref 150–400)
RBC: 4.13 MIL/uL (ref 3.87–5.11)
RDW: 13.5 % (ref 11.5–15.5)
WBC: 7.3 10*3/uL (ref 4.0–10.5)
nRBC: 0 % (ref 0.0–0.2)

## 2023-07-27 LAB — PHOSPHORUS: Phosphorus: 3 mg/dL (ref 2.5–4.6)

## 2023-07-27 LAB — BASIC METABOLIC PANEL WITH GFR
Anion gap: 9 (ref 5–15)
BUN: 10 mg/dL (ref 8–23)
CO2: 21 mmol/L — ABNORMAL LOW (ref 22–32)
Calcium: 8.8 mg/dL — ABNORMAL LOW (ref 8.9–10.3)
Chloride: 104 mmol/L (ref 98–111)
Creatinine, Ser: 0.54 mg/dL (ref 0.44–1.00)
GFR, Estimated: >60 mL/min (ref >60)
Glucose, Bld: 102 mg/dL — ABNORMAL HIGH (ref 70–99)
Potassium: 3.6 mmol/L (ref 3.5–5.1)
Sodium: 134 mmol/L — ABNORMAL LOW (ref 135–145)

## 2023-07-27 LAB — MAGNESIUM: Magnesium: 2 mg/dL (ref 1.7–2.4)

## 2023-07-27 MED ORDER — SODIUM CHLORIDE 0.9 % IV SOLN: INTRAVENOUS | Status: DC

## 2023-07-27 MED ORDER — ENSURE PLUS HIGH PROTEIN PO LIQD
237.0000 mL | Freq: Two times a day (BID) | ORAL | Status: DC
  Administered 2023-07-28 – 2023-07-31 (×6): 237 mL via ORAL

## 2023-07-27 NOTE — Care Management Important Message (Signed)
 Important Message  Patient Details  Name: Terri Ayala MRN: [id number] Date of Birth: [birth year]   Important Message Given:  Yes - Medicare IM     Wynonia Hedges 07/27/2023, 3:22 PM

## 2023-07-27 NOTE — Progress Notes (Signed)
 Speech Language Pathology Treatment: Dysphagia  Patient Details Name: Terri Ayala MRN: [id number] DOB: January 28, 1956 Today's Date: 07/27/2023 Time: 0850-0859 SLP Time Calculation (min) (ACUTE ONLY): 9 min  Assessment / Plan / Recommendation Clinical Impression  Pt easily awakened, cooperative and accepting of po's this morning, some confusion but improved. Evidence of pain due to rib fractures but allowed upright position (slightly reclined but adequate). No s/s aspiration with multiple straw sips thin, vocal quality clear. Masticated multiple trials regular texture timely without residue. Pt appeared interested in po's. Recommend continue regular texture, thin liquids, pills with thin. She will need assist with meals due to mentation. No ST follow up needed.    HPI HPI: 68 y.o. female adm 5/26 with medical history significant of depression, anxiety, PTSD, heavy alcohol  use, hypertension neuropathy migraines.  Presented after a fall at home down 5 steps.  Imaging reveals Acute displaced fracture of the posterior left eighth rib.      SLP Plan  All goals met;Discharge SLP treatment due to (comment)      Recommendations for follow up therapy are one component of a multi-disciplinary discharge planning process, led by the attending physician.  Recommendations may be updated based on patient status, additional functional criteria and insurance authorization.    Recommendations  Diet recommendations: Regular;Thin liquid Liquids provided via: Cup;Straw Medication Administration: Whole meds with liquid Supervision: Patient able to self feed;Staff to assist with self feeding Compensations: Slow rate;Small sips/bites Postural Changes and/or Swallow Maneuvers: Seated upright 90 degrees                  Oral care BID   Frequent or constant Supervision/Assistance Dysphagia, unspecified (R13.10)     All goals met;Discharge SLP treatment due to (comment)     [name]  07/27/2023, 9:06 AM

## 2023-07-27 NOTE — Progress Notes (Signed)
 [facility] PROGRESS NOTE    Progress Note  Terri Ayala  ZOX:[id number] DOB: 10-16-1955 DOA: 07/24/2023 PCP: Noreene Bearded, PA     Brief Narrative:   Terri Ayala is an 68 y.o. female past medical history significant for depression anxiety PTSD, alcohol  use and hypertension comes to the ED after a fall, he relates he has been drinking heavily before this denies hitting her head or loss of consciousness.   Assessment/Plan:   Alcohol  dependence with withdrawals: Continue with thiamine  and folate. Full resuscitated started on the CIWA protocol she remains at withdrawal, continue with full CIWA protocol.  Having tremors, and altered  Acute posterior left eighth rib fracture: Appreciated on CT she also has old healed rib fractures. Continue narcotics for pain. Started on a bowel regimen. Alcoholic peripheral neuropathy: Continue Neurontin . Encouraged to use incentive spirometer, discussed with staff to educate her about incentive spirometer when awake and compliant  Benign essential hypertension: Continue Coreg  twice a day.  GERD: Continue PPI.  Hepatic steatosis In the setting of alcohol  abuse noted.  Prolonged QTc: Try to potassium greater than 4 magnesium  greater than 2. First is around 3.  Protein-calorie malnutrition, severe (HCC): Noted.  Nutritionist consulted  Hypophosphatemia - Repleted   DVT prophylaxis: Lovenox  Family Communication:none Status is: Inpatient Remains inpatient appropriate because: Alcohol  abuse with withdrawals   Code Status:     Code Status Orders  (From admission, onward)           Start     Ordered   07/24/23 1908  Full code  Continuous       Question:  By:  Answer:  Consent: discussion documented in EHR   07/24/23 1908           Code Status History     Date Active Date Inactive Code Status Order ID Comments User Context   02/16/2022 0018 02/25/2022 1553 Full Code [id number]  Bary Boss, DO Inpatient    01/25/2022 2250 01/29/2022 1848 Full Code [id number]  Roxana Copier, DO ED   01/25/2022 1132 01/25/2022 2250 Full Code [id number]  Lorra Rosella, PA-C ED   06/08/2021 2002 06/11/2021 1758 Full Code [id number]  Sabas Cradle, MD ED   02/11/2021 0223 02/16/2021 2145 Full Code [id number]  Chotiner, Pauline Bos, MD ED   11/21/2020 0917 11/23/2020 1732 Full Code [id number]  Lorita Rosa, MD ED   11/20/2020 2154 11/21/2020 0917 Full Code [id number]  Coretta Dexter, PA ED   09/26/2020 1726 10/01/2020 0452 Full Code [id number]  Marine Sia, MD Inpatient   03/16/2019 2057 03/21/2019 2025 Full Code [id number]  Juliette Oh, MD ED   03/16/2019 1257 03/16/2019 1258 Full Code [id number]  Coretta Dexter, PA ED   02/06/2019 0150 02/08/2019 2211 Full Code [id number]  Vanita Gens, MD ED   05/05/2018 0311 05/08/2018 1507 Full Code [id number]  Dea Evert, DO ED   01/04/2018 1654 01/10/2018 1754 Full Code [id number]  Lorita Rosa, MD ED   12/08/2017 0509 12/10/2017 1937 Full Code [id number]  Lena Qualia, MD ED   11/06/2017 2353 11/09/2017 1602 Full Code [id number]  Pati Bonine, MD ED   04/03/2014 1352 04/07/2014 2119 Full Code [id number]  Alfreida Inches, NP Inpatient   04/03/2014 1352 04/03/2014 1352 Full Code [id number]  Alfreida Inches, NP Inpatient   04/02/2014 2218 04/03/2014 1352 Full Code [id number]  Marlo Simpler ED   06/21/2013 1333 06/21/2013 2103 Full Code [id number]  Mark Sil, MD  Inpatient   06/17/2013 1958 06/18/2013 0342 Full Code [id number]  Mee Spillers., MD ED   03/14/2013 1235 03/26/2013 1917 Full Code [id number]  Oza Blumenthal, MD Inpatient         IV Access:   Peripheral IV   Procedures and diagnostic studies:   No results found.    Medical Consultants:   None.   Subjective:    Marigene Shoulder denies any complaints today, but in significant withdrawals with tremors despite receiving Ativan  already this a.m.  Objective:    Vitals:   07/27/23 0135  07/27/23 0400 07/27/23 0600 07/27/23 0800  BP: 131/79 121/76 128/85 114/78  Pulse: 97 100 (!) 104 (!) 109  Resp: 14 18  14   Temp:  97.8 F (36.6 C)    TempSrc:  Oral    SpO2: 96%   100%  Weight:      Height:       SpO2: 100 %  No intake or output data in the 24 hours ending 07/27/23 1316  Filed Weights   07/24/23 1002  Weight: 54.4 kg    Exam:  , But wakes up, confused, still with tremors. Symmetrical Chest wall movement, Good air movement bilaterally, CTAB RRR,No Gallops,Rubs or new Murmurs, No Parasternal Heave +ve B.Sounds, Abd Soft, No tenderness, No rebound - guarding or rigidity. No Cyanosis, Clubbing or edema, No new Rash or bruise      Data Reviewed:    Labs: Basic Metabolic Panel: Recent Labs  Lab 07/24/23 1049 07/24/23 1055 07/24/23 2250 07/25/23 0500 07/26/23 0756 07/27/23 0548  NA 135 137 137 137 136 134*  K 4.1 3.8 3.9 3.7 4.3 3.6  CL 102 102 105 104 103 104  CO2 21*  --  22 22 21* 21*  GLUCOSE 106* 104* 103* 95 117* 102*  BUN 15 16 15 16 10 10   CREATININE 0.77 0.90 0.96 0.84 0.67 0.54  CALCIUM  8.3*  --  8.1* 7.9* 8.9 8.8*  MG  --   --  1.6* 1.5* 2.1 2.0  PHOS  --   --  3.8 2.9 1.9* 3.0   GFR Estimated Creatinine Clearance: 53.2 mL/min (by C-G formula based on SCr of 0.54 mg/dL). Liver Function Tests: Recent Labs  Lab 07/24/23 2250 07/25/23 0500  AST 42* 36  ALT 15 14  ALKPHOS 64 58  BILITOT 0.8 1.0  PROT 6.3* 5.9*  ALBUMIN 3.4* 3.1*   No results for input(s): "LIPASE", "AMYLASE" in the last 168 hours. Recent Labs  Lab 07/24/23 2255  AMMONIA 25   Coagulation profile Recent Labs  Lab 07/24/23 2250  INR 1.0   COVID-19 Labs  Recent Labs    07/24/23 2250  FERRITIN 69    Lab Results  Component Value Date   SARSCOV2NAA NEGATIVE 02/15/2021   SARSCOV2NAA NEGATIVE 02/10/2021   SARSCOV2NAA NEGATIVE 09/26/2020   SARSCOV2NAA NEGATIVE 03/16/2019    CBC: Recent Labs  Lab 07/24/23 1049 07/24/23 1055 07/25/23 0500  07/26/23 0756 07/27/23 0548  WBC 8.8  --  5.9 7.8 7.3  NEUTROABS 6.6  --   --   --   --   HGB 11.6* 12.6 10.8* 12.3 12.5  HCT 34.9* 37.0 33.3* 36.5 37.7  MCV 92.3  --  94.6 91.0 91.3  PLT 178  --  150 175 193   Cardiac Enzymes: Recent Labs  Lab 07/24/23 2250  CKTOTAL 64   BNP (last 3 results) No results for input(s): "PROBNP" in the last 8760 hours. CBG: Recent Labs  Lab 07/26/23 1641  GLUCAP 119*   D-Dimer: No results for input(s): "DDIMER" in the last 72 hours. Hgb A1c: No results for input(s): "HGBA1C" in the last 72 hours. Lipid Profile: No results for input(s): "CHOL", "HDL", "LDLCALC", "TRIG", "CHOLHDL", "LDLDIRECT" in the last 72 hours. Thyroid  function studies: Recent Labs    07/24/23 2250  TSH 6.915*   Anemia work up: Recent Labs    07/24/23 2250 07/25/23 0500 07/25/23 1645  VITAMINB12  --   --  605  FOLATE  --  27.4  --   FERRITIN 69  --   --   TIBC 358  --   --   IRON 261*  --   --   RETICCTPCT 2.7  --   --    Sepsis Labs: Recent Labs  Lab 07/24/23 1049 07/25/23 0500 07/26/23 0756 07/27/23 0548  WBC 8.8 5.9 7.8 7.3   Microbiology No results found for this or any previous visit (from the past 240 hours).   Medications:    baclofen   10 mg Oral TID   buPROPion   150 mg Oral BH-q7a   busPIRone   15 mg Oral TID   carvedilol   6.25 mg Oral BID WC   DULoxetine   60 mg Oral BID   folic acid   1 mg Oral Daily   gabapentin   300 mg Oral BID   multivitamin with minerals  1 tablet Oral Daily   pantoprazole   40 mg Oral BID   thiamine   100 mg Oral Daily   Or   thiamine   100 mg Intravenous Daily   Continuous Infusions:    LOS: 3 days   Desyre Calma  [facility]  07/27/2023, 1:16 PM

## 2023-07-27 NOTE — Progress Notes (Signed)
 Transition of Care [facility]) - CAGE-AID Screening   Patient Details  Name: MARLINE MORACE MRN: [id number] Date of Birth: 1955-12-21  Transition of Care [facility]) CM/SW Contact:    Misty Amour, RN Phone Number: 07/27/2023, 3:51 AM   Clinical Narrative: Pt currently on CIWA and being followed by CSW with resources.   CAGE-AID Screening:    Have You Ever Felt You Ought to Cut Down on Your Drinking or Drug Use?: Yes          Substance Abuse Education Offered: Yes

## 2023-07-28 DIAGNOSIS — E440 Moderate protein-calorie malnutrition: Secondary | ICD-10-CM | POA: Insufficient documentation

## 2023-07-28 DIAGNOSIS — F10239 Alcohol dependence with withdrawal, unspecified: Secondary | ICD-10-CM | POA: Diagnosis not present

## 2023-07-28 LAB — BASIC METABOLIC PANEL WITH GFR
Anion gap: 11 (ref 5–15)
BUN: 13 mg/dL (ref 8–23)
CO2: 18 mmol/L — ABNORMAL LOW (ref 22–32)
Calcium: 8.5 mg/dL — ABNORMAL LOW (ref 8.9–10.3)
Chloride: 105 mmol/L (ref 98–111)
Creatinine, Ser: 0.65 mg/dL (ref 0.44–1.00)
GFR, Estimated: >60 mL/min (ref >60)
Glucose, Bld: 89 mg/dL (ref 70–99)
Potassium: 3.6 mmol/L (ref 3.5–5.1)
Sodium: 134 mmol/L — ABNORMAL LOW (ref 135–145)

## 2023-07-28 LAB — CBC
HCT: 34.1 % — ABNORMAL LOW (ref 36.0–46.0)
Hemoglobin: 11.5 g/dL — ABNORMAL LOW (ref 12.0–15.0)
MCH: 30.8 pg (ref 26.0–34.0)
MCHC: 33.7 g/dL (ref 30.0–36.0)
MCV: 91.4 fL (ref 80.0–100.0)
Platelets: 234 10*3/uL (ref 150–400)
RBC: 3.73 MIL/uL — ABNORMAL LOW (ref 3.87–5.11)
RDW: 13.6 % (ref 11.5–15.5)
WBC: 8.9 10*3/uL (ref 4.0–10.5)
nRBC: 0 % (ref 0.0–0.2)

## 2023-07-28 LAB — MAGNESIUM: Magnesium: 1.8 mg/dL (ref 1.7–2.4)

## 2023-07-28 LAB — PHOSPHORUS: Phosphorus: 3.2 mg/dL (ref 2.5–4.6)

## 2023-07-28 MED ORDER — LORAZEPAM 2 MG/ML IJ SOLN
1.0000 mg | INTRAMUSCULAR | Status: AC | PRN
  Administered 2023-07-29: 1 mg via INTRAVENOUS
  Filled 2023-07-28: qty 1

## 2023-07-28 MED ORDER — POTASSIUM CHLORIDE CRYS ER 20 MEQ PO TBCR
40.0000 meq | EXTENDED_RELEASE_TABLET | Freq: Once | ORAL | Status: AC
  Administered 2023-07-28: 40 meq via ORAL
  Filled 2023-07-28: qty 2

## 2023-07-28 MED ORDER — LORAZEPAM 1 MG PO TABS
1.0000 mg | ORAL_TABLET | ORAL | Status: AC | PRN
  Administered 2023-07-28 – 2023-07-29 (×3): 2 mg via ORAL
  Administered 2023-07-30: 3 mg via ORAL
  Administered 2023-07-30 – 2023-07-31 (×3): 2 mg via ORAL
  Filled 2023-07-28: qty 2
  Filled 2023-07-28: qty 3
  Filled 2023-07-28 (×5): qty 2

## 2023-07-28 NOTE — Progress Notes (Signed)
 Physical Therapy Treatment Patient Details Name: Terri Ayala MRN: [id number] DOB: May 01, 1955 Today's Date: 07/28/2023   History of Present Illness 68 y.o. female adm 5/26 with medical history significant of depression, anxiety, PTSD, heavy alcohol  use, hypertension neuropathy migraines.  Presented after a fall at home down 5 steps.  Imaging reveals Acute displaced fracture of the posterior left eighth rib.    PT Comments  Patient OOB on arrival and attempting to get up, cues needed for safety. Pt reporting need for bathroom. Poor awareness of deficits asking to go to real bathroom. Patient noted to have heavy tremors at rest and increased with anterior lean and movements. Mod-Max +2 assist for stand to RW and pt with strong posterior lean and slight Rt lean. Returned to sit and [facility] provided for closer guarding and therapist to block LE's. Pt tending to lift Rt LE off ground and extend forward throwing balance off further and transfer to [facility] deferred. Pt unable to take steps and stand pivot max+2 completed chair>EOB. Pt positioned on bedpan at EOB and voided with spillage over edge. Pt returned to supine and linen change completed with mod-max assist to roll. EOB pt placed in partial chair position and RN present. Alarm on and call bell within reach. Will continue to progress pt as able during acute stay. Patient will benefit from continued inpatient follow up therapy, <3 hours/day.    If plan is discharge home, recommend the following: Two people to help with walking and/or transfers;Two people to help with bathing/dressing/bathroom;Assistance with cooking/housework;Assistance with feeding;Direct supervision/assist for medications management;Assist for transportation;Help with stairs or ramp for entrance;Supervision due to cognitive status   Can travel by private vehicle     No  Equipment Recommendations  None recommended by PT (tbd)    Recommendations for Other Services       Precautions /  Restrictions Precautions Precautions: Fall Recall of Precautions/Restrictions: Intact Precaution/Restrictions Comments: ETOH Withdrawals Restrictions Weight Bearing Restrictions Per Provider Order: No     Mobility  Bed Mobility Overal bed mobility: Needs Assistance Bed Mobility: Rolling, Sit to Supine Rolling: Mod assist, Max assist     Sit to supine: Max assist, Total assist   General bed mobility comments: mod assist to roll Rt and max assist to roll Lt due to pain. attempted to initiate supine<>sit but pt more lethargic as session went on. rolling completed for linen change due to soiled with urine.    Transfers Overall transfer level: Needs assistance Equipment used: 2 person hand held assist, Rolling walker (2 wheels) Transfers: Sit to/from Stand, Bed to chair/wheelchair/BSC Sit to Stand: Mod assist, Max assist, +2 physical assistance, +2 safety/equipment           General transfer comment: Mod-Max for sit<>stand with RW, heavy posterior lean and pt lifting Rt LE off floor advancing forward. returned to sit. 2HHA technique for sit<>stand with goal for pivot chair>BSC. despite Max +2 pt leanig posterior and unable to position Rt/Lt foot for safe step pattern. Stand Pivot performed chair>EOB with Max +2.    Ambulation/Gait                   Stairs             Wheelchair Mobility     Tilt Bed    Modified Rankin (Stroke Patients Only)       Balance Overall balance assessment: Needs assistance Sitting-balance support: Feet unsupported Sitting balance-Leahy Scale: Poor   Postural control: Right lateral lean, Posterior lean Standing  balance support: Bilateral upper extremity supported, During functional activity Standing balance-Leahy Scale: Zero Standing balance comment: Max/Total support +2                            Communication Communication Communication: Impaired Factors Affecting Communication: Hearing impaired  Cognition  Arousal: Lethargic Behavior During Therapy: Flat affect   PT - Cognitive impairments: Orientation, Awareness, Memory, Attention, Initiation, Sequencing, Safety/Judgement, Problem solving, No family/caregiver present to determine baseline   Orientation impairments: Time, Situation, Place (needed cues to get, able to recall place 2 minutes later, unable to recall year)                   PT - Cognition Comments: alertness decreased throughout session due to fatigue and medication. Following commands: Impaired      Cueing Cueing Techniques: Verbal cues, Gestural cues, Tactile cues  Exercises      General Comments General comments (skin integrity, edema, etc.): pt sat on bedpan at EOB, unable to scoot posterio enough and urine spilled over requiring total assist to clean up and linen change in bed.      Pertinent Vitals/Pain Pain Assessment Pain Assessment: Faces Faces Pain Scale: Hurts whole lot Pain Location: L flank with bed mobility Pain Descriptors / Indicators: Grimacing, Guarding, Sharp Pain Intervention(s): Limited activity within patient's tolerance, Monitored during session, Repositioned    Home Living                          Prior Function            PT Goals (current goals can now be found in the care plan section) Acute Rehab PT Goals Patient Stated Goal: none stated PT Goal Formulation: Patient unable to participate in goal setting Time For Goal Achievement: 08/09/23 Potential to Achieve Goals: Fair Progress towards PT goals: Progressing toward goals    Frequency    Min 2X/week      PT Plan      Co-evaluation              AM-PAC PT "6 Clicks" Mobility   Outcome Measure  Help needed turning from your back to your side while in a flat bed without using bedrails?: Total Help needed moving from lying on your back to sitting on the side of a flat bed without using bedrails?: Total Help needed moving to and from a bed to a chair  (including a wheelchair)?: Total Help needed standing up from a chair using your arms (e.g., wheelchair or bedside chair)?: Total Help needed to walk in hospital room?: Total Help needed climbing 3-5 steps with a railing? : Total 6 Click Score: 6    End of Session Equipment Utilized During Treatment: Gait belt Activity Tolerance: Patient limited by pain;Patient tolerated treatment well Patient left: in bed;with call bell/phone within reach;with bed alarm set;with nursing/sitter in room Nurse Communication: Mobility status PT Visit Diagnosis: Other abnormalities of gait and mobility (R26.89);Muscle weakness (generalized) (M62.81);Difficulty in walking, not elsewhere classified (R26.2);Unsteadiness on feet (R26.81);Pain;History of falling (Z91.81) Pain - Right/Left: Left Pain - part of body:  (side/ribs)     Time: 8295-6213 PT Time Calculation (min) (ACUTE ONLY): 29 min  Charges:    $Therapeutic Activity: 23-37 mins PT General Charges $$ ACUTE PT VISIT: 1 Visit                     Corbin Dess. PT,  DPT Acute Rehabilitation Services Office [phone]  07/28/23 1:12 PM

## 2023-07-28 NOTE — Plan of Care (Signed)

## 2023-07-28 NOTE — Progress Notes (Signed)
 [facility] PROGRESS NOTE    Progress Note  [name]  QVZ:[id number] DOB: 1955-07-13 DOA: 07/24/2023 PCP: Noreene Bearded, PA     Brief Narrative:   Terri Ayala is an 68 y.o. female past medical history significant for depression anxiety PTSD, alcohol  use and hypertension comes to the ED after a fall, he relates he has been drinking heavily before this denies hitting her head or loss of consciousness.   Assessment/Plan:   Alcohol  dependence with withdrawals: Continue with thiamine  and folate. Continue with CIWA protocol, remains with some withdrawals but much improved, she will extend her CIWA protocol.  .  Acute posterior left eighth rib fracture: Appreciated on CT she also has old healed rib fractures. Continue narcotics for pain. Started on a bowel regimen. Alcoholic peripheral neuropathy: Continue Neurontin . Encouraged to use incentive spirometer, discussed with staff to educate her about incentive spirometer when awake and compliant  Benign essential hypertension: Continue Coreg  twice a day.  GERD: Continue PPI.  Hepatic steatosis In the setting of alcohol  abuse noted.  Protein-calorie malnutrition, moderate (HCC): Noted.  Nutritionist consulted  Hypophosphatemia - Repleted   DVT prophylaxis: Lovenox  Family Communication:none Status is: Inpatient Remains inpatient appropriate because: Alcohol  abuse with withdrawals   Code Status:     Code Status Orders  (From admission, onward)           Start     Ordered   07/24/23 1908  Full code  Continuous       Question:  By:  Answer:  Consent: discussion documented in EHR   07/24/23 1908           Code Status History     Date Active Date Inactive Code Status Order ID Comments User Context   02/16/2022 0018 02/25/2022 1553 Full Code [id number]  Bary Boss, DO Inpatient   01/25/2022 2250 01/29/2022 1848 Full Code [id number]  Roxana Copier, DO ED   01/25/2022 1132 01/25/2022 2250  Full Code [id number]  Lorra Rosella, PA-C ED   06/08/2021 2002 06/11/2021 1758 Full Code [id number]  Sabas Cradle, MD ED   02/11/2021 0223 02/16/2021 2145 Full Code [id number]  Chotiner, Pauline Bos, MD ED   11/21/2020 0917 11/23/2020 1732 Full Code [id number]  Lorita Rosa, MD ED   11/20/2020 2154 11/21/2020 0917 Full Code [id number]  Coretta Dexter, PA ED   09/26/2020 1726 10/01/2020 0452 Full Code [id number]  Marine Sia, MD Inpatient   03/16/2019 2057 03/21/2019 2025 Full Code [id number]  Juliette Oh, MD ED   03/16/2019 1257 03/16/2019 1258 Full Code [id number]  Coretta Dexter, PA ED   02/06/2019 0150 02/08/2019 2211 Full Code [id number]  Vanita Gens, MD ED   05/05/2018 0311 05/08/2018 1507 Full Code [id number]  Dea Evert, DO ED   01/04/2018 1654 01/10/2018 1754 Full Code [id number]  Lorita Rosa, MD ED   12/08/2017 0509 12/10/2017 1937 Full Code [id number]  Lena Qualia, MD ED   11/06/2017 2353 11/09/2017 1602 Full Code [id number]  Pati Bonine, MD ED   04/03/2014 1352 04/07/2014 2119 Full Code [id number]  Alfreida Inches, NP Inpatient   04/03/2014 1352 04/03/2014 1352 Full Code [id number]  Alfreida Inches, NP Inpatient   04/02/2014 2218 04/03/2014 1352 Full Code [id number]  Marlo Simpler ED   06/21/2013 1333 06/21/2013 2103 Full Code [id number]  Mark Sil, MD Inpatient   06/17/2013 1958 06/18/2013 0342 Full Code [id number]  Mee Spillers., MD ED   03/14/2013  1235 03/26/2013 1917 Full Code [id number]  Oza Blumenthal, MD Inpatient         IV Access:   Peripheral IV   Procedures and diagnostic studies:   No results found.    Medical Consultants:   None.   Subjective:    Marigene Shoulder appetite remains poor yesterday, this morning was unable to finish 25%, but more coherent and able to follow some commands like follow-up doing incentive spirometer. Objective:    Vitals:   07/27/23 2355 07/28/23 0354 07/28/23 0845 07/28/23 1245  BP: 138/84 (!)  135/119 (!) 150/90 139/86  Pulse: (!) 106 (!) 110 (!) 115   Resp: 19 20 (!) 23 (!) 23  Temp: 98.1 F (36.7 C) 97.8 F (36.6 C) 98.4 F (36.9 C) 98.6 F (37 C)  TempSrc: Oral Oral Oral Oral  SpO2: 98%  100% 100%  Weight:      Height:       SpO2: 100 %   Intake/Output Summary (Last 24 hours) at 07/28/2023 1303 Last data filed at 07/27/2023 2000 Gross per 24 hour  Intake 475.7 ml  Output 0 ml  Net 475.7 ml    Filed Weights   07/24/23 1002  Weight: 54.4 kg    Exam:  He is more awake and appropriate today, but remains confused, tremor still present but improved. Symmetrical Chest wall movement, Good air movement bilaterally, CTAB RRR,No Gallops,Rubs or new Murmurs, No Parasternal Heave +ve B.Sounds, Abd Soft, No tenderness, No rebound - guarding or rigidity. No Cyanosis, Clubbing or edema, No new Rash or bruise       Data Reviewed:    Labs: Basic Metabolic Panel: Recent Labs  Lab 07/24/23 2250 07/25/23 0500 07/26/23 0756 07/27/23 0548 07/28/23 0527  NA 137 137 136 134* 134*  K 3.9 3.7 4.3 3.6 3.6  CL 105 104 103 104 105  CO2 22 22 21* 21* 18*  GLUCOSE 103* 95 117* 102* 89  BUN 15 16 10 10 13   CREATININE 0.96 0.84 0.67 0.54 0.65  CALCIUM  8.1* 7.9* 8.9 8.8* 8.5*  MG 1.6* 1.5* 2.1 2.0 1.8  PHOS 3.8 2.9 1.9* 3.0 3.2   GFR Estimated Creatinine Clearance: 53.2 mL/min (by C-G formula based on SCr of 0.65 mg/dL). Liver Function Tests: Recent Labs  Lab 07/24/23 2250 07/25/23 0500  AST 42* 36  ALT 15 14  ALKPHOS 64 58  BILITOT 0.8 1.0  PROT 6.3* 5.9*  ALBUMIN 3.4* 3.1*   No results for input(s): "LIPASE", "AMYLASE" in the last 168 hours. Recent Labs  Lab 07/24/23 2255  AMMONIA 25   Coagulation profile Recent Labs  Lab 07/24/23 2250  INR 1.0   COVID-19 Labs  No results for input(s): "DDIMER", "FERRITIN", "LDH", "CRP" in the last 72 hours.   Lab Results  Component Value Date   SARSCOV2NAA NEGATIVE 02/15/2021   SARSCOV2NAA NEGATIVE  02/10/2021   SARSCOV2NAA NEGATIVE 09/26/2020   SARSCOV2NAA NEGATIVE 03/16/2019    CBC: Recent Labs  Lab 07/24/23 1049 07/24/23 1055 07/25/23 0500 07/26/23 0756 07/27/23 0548 07/28/23 0527  WBC 8.8  --  5.9 7.8 7.3 8.9  NEUTROABS 6.6  --   --   --   --   --   HGB 11.6* 12.6 10.8* 12.3 12.5 11.5*  HCT 34.9* 37.0 33.3* 36.5 37.7 34.1*  MCV 92.3  --  94.6 91.0 91.3 91.4  PLT 178  --  150 175 193 234   Cardiac Enzymes: Recent Labs  Lab 07/24/23 2250  CKTOTAL 64  BNP (last 3 results) No results for input(s): "PROBNP" in the last 8760 hours. CBG: Recent Labs  Lab 07/26/23 1641  GLUCAP 119*   D-Dimer: No results for input(s): "DDIMER" in the last 72 hours. Hgb A1c: No results for input(s): "HGBA1C" in the last 72 hours. Lipid Profile: No results for input(s): "CHOL", "HDL", "LDLCALC", "TRIG", "CHOLHDL", "LDLDIRECT" in the last 72 hours. Thyroid  function studies: No results for input(s): "TSH", "T4TOTAL", "T3FREE", "THYROIDAB" in the last 72 hours.  Invalid input(s): "FREET3"  Anemia work up: Recent Labs    07/25/23 1645  VITAMINB12 605   Sepsis Labs: Recent Labs  Lab 07/25/23 0500 07/26/23 0756 07/27/23 0548 07/28/23 0527  WBC 5.9 7.8 7.3 8.9   Microbiology No results found for this or any previous visit (from the past 240 hours).   Medications:    baclofen   10 mg Oral TID   buPROPion   150 mg Oral BH-q7a   busPIRone   15 mg Oral TID   carvedilol   6.25 mg Oral BID WC   DULoxetine   60 mg Oral BID   feeding supplement  237 mL Oral BID BM   folic acid   1 mg Oral Daily   gabapentin   300 mg Oral BID   multivitamin with minerals  1 tablet Oral Daily   pantoprazole   40 mg Oral BID   thiamine   100 mg Oral Daily   Or   thiamine   100 mg Intravenous Daily   Continuous Infusions:  sodium chloride  75 mL/hr at 07/28/23 0639      LOS: 4 days   Kenlee Maler  [facility]  07/28/2023, 1:03 PM

## 2023-07-28 NOTE — TOC Initial Note (Signed)
 Transition of Care [facility]) - Initial/Assessment Note    Patient Details  Name: Terri Ayala MRN: [id number] Date of Birth: Jul 09, 1955  Transition of Care [facility]) CM/SW Contact:    Jannice Mends, LCSW Phone Number: 07/28/2023, 5:06 PM  Clinical Narrative:                 CSW continuing to follow for patient's improvement in mental status. If she does not improve, may need SNF placement though in the past she typically refuses in favor of returning home.     Barriers to Discharge: Continued Medical Work up   Patient Goals and CMS Choice            Expected Discharge Plan and Services In-house Referral: Clinical Social Work                                            Prior Living Arrangements/Services     Patient language and need for interpreter reviewed:: Yes Do you feel safe going back to the place where you live?: Yes      Need for Family Participation in Patient Care: Yes (Comment)     Criminal Activity/Legal Involvement Pertinent to Current Situation/Hospitalization: No - Comment as needed  Activities of Daily Living   ADL Screening (condition at time of admission) Independently performs ADLs?: No Does the patient have a NEW difficulty with bathing/dressing/toileting/self-feeding that is expected to last >3 days?: No Does the patient have a NEW difficulty with getting in/out of bed, walking, or climbing stairs that is expected to last >3 days?: No Does the patient have a NEW difficulty with communication that is expected to last >3 days?: No Is the patient deaf or have difficulty hearing?: Yes Does the patient have difficulty seeing, even when wearing glasses/contacts?: No Does the patient have difficulty concentrating, remembering, or making decisions?: No  Permission Sought/Granted                  Emotional Assessment Appearance:: Appears stated age Attitude/Demeanor/Rapport: Unable to Assess Affect (typically observed): Unable to  Assess Orientation: : Oriented to Self Alcohol  / Substance Use: Alcohol  Use Psych Involvement: No (comment)  Admission diagnosis:  Alcohol  withdrawal (HCC) [F10.939] Intractable pain [R52] Closed fracture of one rib of left side, initial encounter [S22.32XA] Patient Active Problem List   Diagnosis Date Noted   Malnutrition of moderate degree 07/28/2023   Alcohol  withdrawal (HCC) 07/24/2023   Elevated TSH 07/24/2023   Sensorineural hearing loss, bilateral 01/01/2023   Right ear pain 01/01/2023   Foreign body of right ear 01/01/2023   Alcoholic peripheral neuropathy (HCC) 12/28/2022   Mild cognitive impairment 10/17/2022   Balance problem 05/23/2022   Abnormal movement 05/23/2022   Weakness of both hands 05/23/2022   Rosacea 05/17/2021   Seborrheic keratosis 05/17/2021   Melanocytic nevi of trunk 05/17/2021   Chronic pain of left ankle 05/12/2021   Body mass index 26.0-26.9, adult 05/12/2021   Thyroid  nodule 02/10/2021   Prolonged QT interval 02/10/2021   Elevated cholesterol 10/19/2020   Gastroparesis 03/25/2020   Gastroesophageal reflux disease without esophagitis 03/25/2020   Elevated LFTs 07/12/2018   Left rib fracture 01/04/2018   Leukocytosis 12/08/2017   Benign essential HTN 12/08/2017   Osteopenia 08/27/2016   Major depressive disorder, recurrent episode, moderate (HCC) 01/27/2016   Alcohol  dependence with withdrawal (HCC) 01/27/2016   Uncomplicated alcohol  dependence (HCC)  04/03/2014   Lumbosacral radiculopathy at L5 05/20/2013   Protein-calorie malnutrition, severe (HCC) 03/18/2013   Nonspecific elevation of levels of transaminase or lactic acid dehydrogenase (LDH) 03/16/2013   Hepatic steatosis 03/16/2013   Chronic cholecystitis 03/14/2013   Cervical arthritis 10/12/2012   Palpitations 11/29/2010   Supraventricular tachycardia (HCC) 11/29/2010   Mitral valve prolapse 11/29/2010   Decreased libido 11/15/2010   Multinodular goiter 08/17/2009   Migraine  08/17/2009   Hearing loss 08/17/2009   Osteoporosis 08/17/2009   Generalized anxiety disorder 08/17/2009   Eosinophilic esophagitis 06/19/2007   PCP:  Noreene Bearded, PA Pharmacy:   [facility] - [place], [place] - [address] [place] [address] Phone: [phone] Fax: [phone]  [id number] - [place], [place] - [address] AT [address] [place] [address] Phone: [phone] Fax: [phone]     Social Drivers of Health (SDOH) Social History: SDOH Screenings   Food Insecurity: Food Insecurity Present (07/27/2023)  Housing: Low Risk  (07/27/2023)  Transportation Needs: No Transportation Needs (07/27/2023)  Utilities: Not At Risk (07/27/2023)  Alcohol  Screen: Low Risk  (01/22/2023)  Depression (PHQ2-9): Low Risk  (05/01/2023)  Financial Resource Strain: High Risk (04/30/2023)  Physical Activity: Insufficiently Active (04/30/2023)  Social Connections: Moderately Isolated (07/27/2023)  Stress: No Stress Concern Present (04/30/2023)  Tobacco Use: Medium Risk (07/24/2023)  Health Literacy: Adequate Health Literacy (01/23/2023)   SDOH Interventions:     Readmission Risk Interventions     No data to display

## 2023-07-29 ENCOUNTER — Inpatient Hospital Stay (HOSPITAL_COMMUNITY)

## 2023-07-29 DIAGNOSIS — F10239 Alcohol dependence with withdrawal, unspecified: Secondary | ICD-10-CM | POA: Diagnosis not present

## 2023-07-29 DIAGNOSIS — R569 Unspecified convulsions: Secondary | ICD-10-CM

## 2023-07-29 LAB — BASIC METABOLIC PANEL WITH GFR
Anion gap: 9 (ref 5–15)
BUN: 14 mg/dL (ref 8–23)
CO2: 21 mmol/L — ABNORMAL LOW (ref 22–32)
Calcium: 9 mg/dL (ref 8.9–10.3)
Chloride: 105 mmol/L (ref 98–111)
Creatinine, Ser: 0.62 mg/dL (ref 0.44–1.00)
GFR, Estimated: >60 mL/min (ref >60)
Glucose, Bld: 120 mg/dL — ABNORMAL HIGH (ref 70–99)
Potassium: 3.7 mmol/L (ref 3.5–5.1)
Sodium: 135 mmol/L (ref 135–145)

## 2023-07-29 LAB — CBC
HCT: 33.8 % — ABNORMAL LOW (ref 36.0–46.0)
Hemoglobin: 11.4 g/dL — ABNORMAL LOW (ref 12.0–15.0)
MCH: 31.1 pg (ref 26.0–34.0)
MCHC: 33.7 g/dL (ref 30.0–36.0)
MCV: 92.3 fL (ref 80.0–100.0)
Platelets: 237 10*3/uL (ref 150–400)
RBC: 3.66 MIL/uL — ABNORMAL LOW (ref 3.87–5.11)
RDW: 13.6 % (ref 11.5–15.5)
WBC: 9.4 10*3/uL (ref 4.0–10.5)
nRBC: 0 % (ref 0.0–0.2)

## 2023-07-29 LAB — PHOSPHORUS: Phosphorus: 3.1 mg/dL (ref 2.5–4.6)

## 2023-07-29 LAB — MAGNESIUM: Magnesium: 1.5 mg/dL — ABNORMAL LOW (ref 1.7–2.4)

## 2023-07-29 MED ORDER — HYDROCODONE-ACETAMINOPHEN 5-325 MG PO TABS
1.0000 | ORAL_TABLET | Freq: Four times a day (QID) | ORAL | Status: DC | PRN
  Administered 2023-07-29 – 2023-08-03 (×14): 1 via ORAL
  Filled 2023-07-29 (×14): qty 1

## 2023-07-29 NOTE — Plan of Care (Signed)

## 2023-07-29 NOTE — TOC Progression Note (Signed)
 Transition of Care [facility]) - Progression Note    Patient Details  Name: Terri Ayala MRN: [id number] Date of Birth: 1955-12-18  Transition of Care [facility]) CM/SW Contact  [place], [place] Phone Number: 07/29/2023, 12:19 PM  Clinical Narrative:     CSW attempted to speak with pt at bedside about dc plans, pt sleeping and difficult to arouse opened her eyes slowly but went back to sleep, will continue to follow.     Barriers to Discharge: Continued Medical Work up  Expected Discharge Plan and Services In-house Referral: Clinical Social Work                                             Social Determinants of Health (SDOH) Interventions SDOH Screenings   Food Insecurity: Food Insecurity Present (07/27/2023)  Housing: Low Risk  (07/27/2023)  Transportation Needs: No Transportation Needs (07/27/2023)  Utilities: Not At Risk (07/27/2023)  Alcohol  Screen: Low Risk  (01/22/2023)  Depression (PHQ2-9): Low Risk  (05/01/2023)  Financial Resource Strain: High Risk (04/30/2023)  Physical Activity: Insufficiently Active (04/30/2023)  Social Connections: Moderately Isolated (07/27/2023)  Stress: No Stress Concern Present (04/30/2023)  Tobacco Use: Medium Risk (07/24/2023)  Health Literacy: Adequate Health Literacy (01/23/2023)    Readmission Risk Interventions     No data to display

## 2023-07-29 NOTE — Progress Notes (Signed)
 [facility] PROGRESS NOTE    Progress Note  Terri Ayala  UUV:[id number] DOB: 1956/01/28 DOA: 07/24/2023 PCP: Noreene Bearded, PA     Brief Narrative:   Terri Ayala is an 68 y.o. female past medical history significant for depression anxiety PTSD, alcohol  use and hypertension comes to the ED after a fall, he relates he has been drinking heavily before this denies hitting her head or loss of consciousness.   Assessment/Plan:   Alcohol  dependence with withdrawals: Continue with thiamine  and folate. On CIWA protocol.  Acute metabolic encephalopathy - With significant confusion and presentation, likely due to alcohol  withdrawal, but she remains confused not at baseline, but much improved, given she presents with altered mental status, while intoxicated, so EEG was obtained , with no evidence of seizures. - Stopped her IV narcotics, mentation has improved as well.  Did minimize her gabapentin  and Vicodin dose as well. -Continue with IV fluid till her oral intake is reliable   Acute posterior left eighth rib fracture: Appreciated on CT she also has old healed rib fractures. Continue narcotics for pain. Started on a bowel regimen. Alcoholic peripheral neuropathy: Continue Neurontin . Encouraged to use incentive spirometer, discussed with staff to educate her about incentive spirometer when awake and compliant  Benign essential hypertension: Continue Coreg  twice a day.  GERD: Continue PPI.  Hepatic steatosis In the setting of alcohol  abuse noted.  Protein-calorie malnutrition, moderate (HCC): Noted.  Nutritionist consulted  Hypophosphatemia - Repleted   DVT prophylaxis: Lovenox  Family Communication:none Status is: Inpatient Remains inpatient appropriate because: Alcohol  abuse with withdrawals   Code Status:     Code Status Orders  (From admission, onward)           Start     Ordered   07/24/23 1908  Full code  Continuous       Question:  By:   Answer:  Consent: discussion documented in EHR   07/24/23 1908           Code Status History     Date Active Date Inactive Code Status Order ID Comments User Context   02/16/2022 0018 02/25/2022 1553 Full Code [id number]  Bary Boss, DO Inpatient   01/25/2022 2250 01/29/2022 1848 Full Code [id number]  Roxana Copier, DO ED   01/25/2022 1132 01/25/2022 2250 Full Code [id number]  Lorra Rosella, PA-C ED   06/08/2021 2002 06/11/2021 1758 Full Code [id number]  Sabas Cradle, MD ED   02/11/2021 0223 02/16/2021 2145 Full Code [id number]  Chotiner, Pauline Bos, MD ED   11/21/2020 0917 11/23/2020 1732 Full Code [id number]  Lorita Rosa, MD ED   11/20/2020 2154 11/21/2020 0917 Full Code [id number]  Coretta Dexter, PA ED   09/26/2020 1726 10/01/2020 0452 Full Code [id number]  Marine Sia, MD Inpatient   03/16/2019 2057 03/21/2019 2025 Full Code [id number]  Juliette Oh, MD ED   03/16/2019 1257 03/16/2019 1258 Full Code [id number]  Coretta Dexter, PA ED   02/06/2019 0150 02/08/2019 2211 Full Code [id number]  Vanita Gens, MD ED   05/05/2018 0311 05/08/2018 1507 Full Code [id number]  Dea Evert, DO ED   01/04/2018 1654 01/10/2018 1754 Full Code [id number]  Lorita Rosa, MD ED   12/08/2017 0509 12/10/2017 1937 Full Code [id number]  Lena Qualia, MD ED   11/06/2017 2353 11/09/2017 1602 Full Code [id number]  Pati Bonine, MD ED   04/03/2014 1352 04/07/2014 2119 Full Code [id number]  Alfreida Inches, NP Inpatient   04/03/2014  1352 04/03/2014 1352 Full Code [id number]  Alfreida Inches, NP Inpatient   04/02/2014 2218 04/03/2014 1352 Full Code [id number]  Marlo Simpler ED   06/21/2013 1333 06/21/2013 2103 Full Code [id number]  Mark Sil, MD Inpatient   06/17/2013 1958 06/18/2013 0342 Full Code [id number]  Mee Spillers., MD ED   03/14/2013 1235 03/26/2013 1917 Full Code [id number]  Oza Blumenthal, MD Inpatient         IV Access:   Peripheral IV   Procedures and diagnostic  studies:   EEG adult Result Date: 07/29/2023 Arleene Lack, MD     07/29/2023 10:51 AM Patient Name: Terri Ayala MRN: [id number] Epilepsy Attending: Arleene Lack Referring Physician/Provider: Epifanio Haste, MD Date: 07/29/2023 Duration: 25.05 mins Patient history: 68yo F with alcohol  use presented with LOC. EEG to evaluate for seizure. Level of alertness: Awake, drowsy AEDs during EEG study: GBP Technical aspects: This EEG study was done with scalp electrodes positioned according to the 10-20 International system of electrode placement. Electrical activity was reviewed with band pass filter of 1-70Hz , sensitivity of 7 uV/mm, display speed of 64mm/sec with a 60Hz  notched filter applied as appropriate. EEG data were recorded continuously and digitally stored.  Video monitoring was available and reviewed as appropriate. Description: The posterior dominant rhythm consists of 8 Hz activity of moderate voltage (25-35 uV) seen predominantly in posterior head regions, symmetric and reactive to eye opening and eye closing. Drowsiness was characterized by attenuation of the posterior background rhythm. Hyperventilation and photic stimulation were not performed.   IMPRESSION: This study is within normal limits. No seizures or epileptiform discharges were seen throughout the recording. Arleene Lack      Medical Consultants:   None.   Subjective:    Terri Ayala denies any complaints today, more interactive, and communicative, oral intake improving, but remains minimal Objective:    Vitals:   07/29/23 0200 07/29/23 0400 07/29/23 0848 07/29/23 1200  BP:  (!) 142/92 (!) 141/90 125/88  Pulse:  96 (!) 108 98  Resp: 17 19 20 18   Temp:  98.4 F (36.9 C) 98.5 F (36.9 C) 98.3 F (36.8 C)  TempSrc:  Axillary Oral Oral  SpO2:  98%    Weight:      Height:       SpO2: 98 %   Intake/Output Summary (Last 24 hours) at 07/29/2023 1301 Last data filed at 07/28/2023 1819 Gross per 24 hour   Intake 240 ml  Output --  Net 240 ml    Filed Weights   07/24/23 1002  Weight: 54.4 kg    Exam:  Awake Alert, Oriented X 3, pale, deconditioned, more awake and appropriate today, but remains with mild confusion, tremors has improved but still present Symmetrical Chest wall movement, Good air movement bilaterally, CTAB RRR,No Gallops,Rubs or new Murmurs, No Parasternal Heave +ve B.Sounds, Abd Soft, No tenderness, No rebound - guarding or rigidity. No Cyanosis, Clubbing or edema, No new Rash or bruise       Data Reviewed:    Labs: Basic Metabolic Panel: Recent Labs  Lab 07/25/23 0500 07/26/23 0756 07/27/23 0548 07/28/23 0527 07/29/23 0228  NA 137 136 134* 134* 135  K 3.7 4.3 3.6 3.6 3.7  CL 104 103 104 105 105  CO2 22 21* 21* 18* 21*  GLUCOSE 95 117* 102* 89 120*  BUN 16 10 10 13 14   CREATININE 0.84 0.67 0.54 0.65 0.62  CALCIUM  7.9* 8.9 8.8* 8.5* 9.0  MG 1.5* 2.1 2.0 1.8 1.5*  PHOS 2.9 1.9* 3.0 3.2 3.1   GFR Estimated Creatinine Clearance: 53.2 mL/min (by C-G formula based on SCr of 0.62 mg/dL). Liver Function Tests: Recent Labs  Lab 07/24/23 2250 07/25/23 0500  AST 42* 36  ALT 15 14  ALKPHOS 64 58  BILITOT 0.8 1.0  PROT 6.3* 5.9*  ALBUMIN 3.4* 3.1*   No results for input(s): "LIPASE", "AMYLASE" in the last 168 hours. Recent Labs  Lab 07/24/23 2255  AMMONIA 25   Coagulation profile Recent Labs  Lab 07/24/23 2250  INR 1.0   COVID-19 Labs  No results for input(s): "DDIMER", "FERRITIN", "LDH", "CRP" in the last 72 hours.   Lab Results  Component Value Date   SARSCOV2NAA NEGATIVE 02/15/2021   SARSCOV2NAA NEGATIVE 02/10/2021   SARSCOV2NAA NEGATIVE 09/26/2020   SARSCOV2NAA NEGATIVE 03/16/2019    CBC: Recent Labs  Lab 07/24/23 1049 07/24/23 1055 07/25/23 0500 07/26/23 0756 07/27/23 0548 07/28/23 0527 07/29/23 0228  WBC 8.8  --  5.9 7.8 7.3 8.9 9.4  NEUTROABS 6.6  --   --   --   --   --   --   HGB 11.6*   < > 10.8* 12.3 12.5 11.5*  11.4*  HCT 34.9*   < > 33.3* 36.5 37.7 34.1* 33.8*  MCV 92.3  --  94.6 91.0 91.3 91.4 92.3  PLT 178  --  150 175 193 234 237   < > = values in this interval not displayed.   Cardiac Enzymes: Recent Labs  Lab 07/24/23 2250  CKTOTAL 64   BNP (last 3 results) No results for input(s): "PROBNP" in the last 8760 hours. CBG: Recent Labs  Lab 07/26/23 1641  GLUCAP 119*   D-Dimer: No results for input(s): "DDIMER" in the last 72 hours. Hgb A1c: No results for input(s): "HGBA1C" in the last 72 hours. Lipid Profile: No results for input(s): "CHOL", "HDL", "LDLCALC", "TRIG", "CHOLHDL", "LDLDIRECT" in the last 72 hours. Thyroid  function studies: No results for input(s): "TSH", "T4TOTAL", "T3FREE", "THYROIDAB" in the last 72 hours.  Invalid input(s): "FREET3"  Anemia work up: No results for input(s): "VITAMINB12", "FOLATE", "FERRITIN", "TIBC", "IRON", "RETICCTPCT" in the last 72 hours.  Sepsis Labs: Recent Labs  Lab 07/26/23 0756 07/27/23 0548 07/28/23 0527 07/29/23 0228  WBC 7.8 7.3 8.9 9.4   Microbiology No results found for this or any previous visit (from the past 240 hours).   Medications:    baclofen   10 mg Oral TID   buPROPion   150 mg Oral BH-q7a   busPIRone   15 mg Oral TID   carvedilol   6.25 mg Oral BID WC   DULoxetine   60 mg Oral BID   feeding supplement  237 mL Oral BID BM   folic acid   1 mg Oral Daily   gabapentin   300 mg Oral BID   multivitamin with minerals  1 tablet Oral Daily   pantoprazole   40 mg Oral BID   thiamine   100 mg Oral Daily   Or   thiamine   100 mg Intravenous Daily   Continuous Infusions:  sodium chloride  75 mL/hr at 07/28/23 2153      LOS: 5 days   Ajdin Macke  [facility]  07/29/2023, 1:01 PM

## 2023-07-29 NOTE — Procedures (Signed)
 Patient Name: Terri Ayala  MRN: [id number]  Epilepsy Attending: Arleene Lack  Referring Physician/Provider: Epifanio Haste, MD  Date: 07/29/2023 Duration: 25.05 mins  Patient history: 68yo F with alcohol  use presented with LOC. EEG to evaluate for seizure.  Level of alertness: Awake, drowsy  AEDs during EEG study: GBP  Technical aspects: This EEG study was done with scalp electrodes positioned according to the 10-20 International system of electrode placement. Electrical activity was reviewed with band pass filter of 1-70Hz , sensitivity of 7 uV/mm, display speed of 78mm/sec with a 60Hz  notched filter applied as appropriate. EEG data were recorded continuously and digitally stored.  Video monitoring was available and reviewed as appropriate.  Description: The posterior dominant rhythm consists of 8 Hz activity of moderate voltage (25-35 uV) seen predominantly in posterior head regions, symmetric and reactive to eye opening and eye closing. Drowsiness was characterized by attenuation of the posterior background rhythm. Hyperventilation and photic stimulation were not performed.     IMPRESSION: This study is within normal limits. No seizures or epileptiform discharges were seen throughout the recording.  Jude Linck O [place]

## 2023-07-29 NOTE — Progress Notes (Signed)
 Routine EEG completed, results pending Neurology review and interpretation

## 2023-07-29 NOTE — Progress Notes (Signed)
 MB arrived to perform Routine EEG and Pt. Needed to use bedpan and receive her pain meds. Nurse is now in the room assisting patient. EEG will be delayed for a short period of time

## 2023-07-30 DIAGNOSIS — F10239 Alcohol dependence with withdrawal, unspecified: Secondary | ICD-10-CM | POA: Diagnosis not present

## 2023-07-30 LAB — CBC
HCT: 30.9 % — ABNORMAL LOW (ref 36.0–46.0)
Hemoglobin: 10.4 g/dL — ABNORMAL LOW (ref 12.0–15.0)
MCH: 30.7 pg (ref 26.0–34.0)
MCHC: 33.7 g/dL (ref 30.0–36.0)
MCV: 91.2 fL (ref 80.0–100.0)
Platelets: 255 10*3/uL (ref 150–400)
RBC: 3.39 MIL/uL — ABNORMAL LOW (ref 3.87–5.11)
RDW: 13.8 % (ref 11.5–15.5)
WBC: 8.2 10*3/uL (ref 4.0–10.5)
nRBC: 0 % (ref 0.0–0.2)

## 2023-07-30 LAB — BASIC METABOLIC PANEL WITH GFR
Anion gap: 10 (ref 5–15)
BUN: 10 mg/dL (ref 8–23)
CO2: 22 mmol/L (ref 22–32)
Calcium: 8.7 mg/dL — ABNORMAL LOW (ref 8.9–10.3)
Chloride: 104 mmol/L (ref 98–111)
Creatinine, Ser: 0.66 mg/dL (ref 0.44–1.00)
GFR, Estimated: >60 mL/min (ref >60)
Glucose, Bld: 114 mg/dL — ABNORMAL HIGH (ref 70–99)
Potassium: 3.2 mmol/L — ABNORMAL LOW (ref 3.5–5.1)
Sodium: 136 mmol/L (ref 135–145)

## 2023-07-30 LAB — MAGNESIUM: Magnesium: 1.5 mg/dL — ABNORMAL LOW (ref 1.7–2.4)

## 2023-07-30 LAB — PHOSPHORUS: Phosphorus: 3.9 mg/dL (ref 2.5–4.6)

## 2023-07-30 MED ORDER — POTASSIUM CHLORIDE CRYS ER 20 MEQ PO TBCR
40.0000 meq | EXTENDED_RELEASE_TABLET | Freq: Four times a day (QID) | ORAL | Status: AC
  Administered 2023-07-30 (×2): 40 meq via ORAL
  Filled 2023-07-30 (×2): qty 2

## 2023-07-30 MED ORDER — POLYETHYLENE GLYCOL 3350 17 G PO PACK
17.0000 g | PACK | Freq: Two times a day (BID) | ORAL | Status: AC
  Administered 2023-07-30 – 2023-07-31 (×4): 17 g via ORAL
  Filled 2023-07-30 (×4): qty 1

## 2023-07-30 MED ORDER — MAGNESIUM SULFATE 2 GM/50ML IV SOLN
2.0000 g | Freq: Once | INTRAVENOUS | Status: AC
  Administered 2023-07-30: 2 g via INTRAVENOUS
  Filled 2023-07-30: qty 50

## 2023-07-30 MED ORDER — CHLORDIAZEPOXIDE HCL 5 MG PO CAPS
5.0000 mg | ORAL_CAPSULE | Freq: Four times a day (QID) | ORAL | Status: DC
  Administered 2023-07-30 – 2023-08-02 (×12): 5 mg via ORAL
  Filled 2023-07-30 (×12): qty 1

## 2023-07-30 NOTE — Plan of Care (Signed)

## 2023-07-30 NOTE — Plan of Care (Signed)
  Problem: Clinical Measurements: Goal: Ability to maintain clinical measurements within normal limits will improve Outcome: Progressing   Problem: Activity: Goal: Risk for activity intolerance will decrease Outcome: Progressing   Problem: Pain Managment: Goal: General experience of comfort will improve and/or be controlled Outcome: Progressing   Problem: Safety: Goal: Ability to remain free from injury will improve Outcome: Progressing

## 2023-07-30 NOTE — Progress Notes (Signed)
 [facility] PROGRESS NOTE    Progress Note  Terri Ayala  ZOX:[id number] DOB: 11-04-1955 DOA: 07/24/2023 PCP: Noreene Bearded, PA     Brief Narrative:   Terri Ayala is an 68 y.o. female past medical history significant for depression anxiety PTSD, alcohol  use and hypertension comes to the ED after a fall, he relates he has been drinking heavily before this denies hitting her head or loss of consciousness.   Assessment/Plan:   Alcohol  dependence with withdrawals: Continue with thiamine  and folate. On CIWA protocol.  Will add scheduled Librium  as she remains in withdrawals  Acute metabolic encephalopathy - With significant confusion and presentation, likely due to alcohol  withdrawal, but she remains confused not at baseline, still appearing to be in withdrawals, adding scheduled Librium  . - EEG with no evidence of seizures . - Mild narcotics, stopped IV narcotics, decreased her gabapentin  and Vicodin . -Still appears to be confused today, as well possible hospital delirium pulled her IV access.    Acute posterior left eighth rib fracture: Appreciated on CT she also has old healed rib fractures. Encouraged use incentive spirometry and flutter valve  Benign essential hypertension: Continue Coreg  twice a day.  GERD: Continue PPI.  Hepatic steatosis In the setting of alcohol  abuse noted.  Protein-calorie malnutrition, moderate (HCC): Failure to thrive Oral intake remains very minimal, continue to encourage supplements, continue with IV fluids - Will start on low-dose mirtazapine  Hypophosphatemia Hypokalemia Hypomagnesemia - Repleted   DVT prophylaxis: Lovenox  Family Communication:none at bedside Status is: Inpatient Remains inpatient appropriate because: Alcohol  abuse with withdrawals   Code Status:     Code Status Orders  (From admission, onward)           Start     Ordered   07/24/23 1908  Full code  Continuous       Question:  By:  Answer:   Consent: discussion documented in EHR   07/24/23 1908           Code Status History     Date Active Date Inactive Code Status Order ID Comments User Context   02/16/2022 0018 02/25/2022 1553 Full Code [id number]  Bary Boss, DO Inpatient   01/25/2022 2250 01/29/2022 1848 Full Code [id number]  Roxana Copier, DO ED   01/25/2022 1132 01/25/2022 2250 Full Code [id number]  Lorra Rosella, PA-C ED   06/08/2021 2002 06/11/2021 1758 Full Code [id number]  Sabas Cradle, MD ED   02/11/2021 0223 02/16/2021 2145 Full Code [id number]  Chotiner, Pauline Bos, MD ED   11/21/2020 0917 11/23/2020 1732 Full Code [id number]  Lorita Rosa, MD ED   11/20/2020 2154 11/21/2020 0917 Full Code [id number]  Coretta Dexter, PA ED   09/26/2020 1726 10/01/2020 0452 Full Code [id number]  Marine Sia, MD Inpatient   03/16/2019 2057 03/21/2019 2025 Full Code [id number]  Juliette Oh, MD ED   03/16/2019 1257 03/16/2019 1258 Full Code [id number]  Coretta Dexter, PA ED   02/06/2019 0150 02/08/2019 2211 Full Code [id number]  Vanita Gens, MD ED   05/05/2018 0311 05/08/2018 1507 Full Code [id number]  Dea Evert, DO ED   01/04/2018 1654 01/10/2018 1754 Full Code [id number]  Lorita Rosa, MD ED   12/08/2017 0509 12/10/2017 1937 Full Code [id number]  Lena Qualia, MD ED   11/06/2017 2353 11/09/2017 1602 Full Code [id number]  Pati Bonine, MD ED   04/03/2014 1352 04/07/2014 2119 Full Code [id number]  Alfreida Inches, NP Inpatient  04/03/2014 1352 04/03/2014 1352 Full Code [id number]  Alfreida Inches, NP Inpatient   04/02/2014 2218 04/03/2014 1352 Full Code [id number]  Marlo Simpler ED   06/21/2013 1333 06/21/2013 2103 Full Code [id number]  Mark Sil, MD Inpatient   06/17/2013 1958 06/18/2013 0342 Full Code [id number]  Mee Spillers., MD ED   03/14/2013 1235 03/26/2013 1917 Full Code [id number]  Oza Blumenthal, MD Inpatient         IV Access:   Peripheral IV   Procedures and diagnostic studies:    EEG adult Result Date: 07/29/2023 Arleene Lack, MD     07/29/2023 10:51 AM Patient Name: Terri Ayala MRN: [id number] Epilepsy Attending: Arleene Lack Referring Physician/Provider: Epifanio Haste, MD Date: 07/29/2023 Duration: 25.05 mins Patient history: 68yo F with alcohol  use presented with LOC. EEG to evaluate for seizure. Level of alertness: Awake, drowsy AEDs during EEG study: GBP Technical aspects: This EEG study was done with scalp electrodes positioned according to the 10-20 International system of electrode placement. Electrical activity was reviewed with band pass filter of 1-70Hz , sensitivity of 7 uV/mm, display speed of 55mm/sec with a 60Hz  notched filter applied as appropriate. EEG data were recorded continuously and digitally stored.  Video monitoring was available and reviewed as appropriate. Description: The posterior dominant rhythm consists of 8 Hz activity of moderate voltage (25-35 uV) seen predominantly in posterior head regions, symmetric and reactive to eye opening and eye closing. Drowsiness was characterized by attenuation of the posterior background rhythm. Hyperventilation and photic stimulation were not performed.   IMPRESSION: This study is within normal limits. No seizures or epileptiform discharges were seen throughout the recording. Arleene Lack      Medical Consultants:   None.   Subjective:    Marigene Shoulder with confusion and restlessness overnight, pulled her IV access Objective:    Vitals:   07/29/23 1952 07/29/23 2331 07/30/23 0337 07/30/23 0806  BP: (!) 148/90 136/88 122/71 (!) 169/98  Pulse: 99 93 (!) 102 (!) 107  Resp: 20   (!) 23  Temp: 98.4 F (36.9 C) 98.3 F (36.8 C) 98.2 F (36.8 C)   TempSrc: Oral Oral Oral   SpO2: 91%  97%   Weight:      Height:       SpO2: 97 %   Intake/Output Summary (Last 24 hours) at 07/30/2023 1118 Last data filed at 07/30/2023 0549 Gross per 24 hour  Intake --  Output 400 ml  Net -400 ml     Filed Weights   07/24/23 1002  Weight: 54.4 kg    Exam:  Awake Alert, he is more confused and restless this morning, with tremors, confused  Symmetrical Chest wall movement, Good air movement bilaterally, CTAB RRR,No Gallops,Rubs or new Murmurs, No Parasternal Heave +ve B.Sounds, Abd Soft, No tenderness, No rebound - guarding or rigidity. No Cyanosis, Clubbing or edema, No new Rash or bruise        Data Reviewed:    Labs: Basic Metabolic Panel: Recent Labs  Lab 07/26/23 0756 07/27/23 0548 07/28/23 0527 07/29/23 0228 07/30/23 0227  NA 136 134* 134* 135 136  K 4.3 3.6 3.6 3.7 3.2*  CL 103 104 105 105 104  CO2 21* 21* 18* 21* 22  GLUCOSE 117* 102* 89 120* 114*  BUN 10 10 13 14 10   CREATININE 0.67 0.54 0.65 0.62 0.66  CALCIUM  8.9 8.8* 8.5* 9.0 8.7*  MG 2.1 2.0 1.8 1.5* 1.5*  PHOS 1.9* 3.0  3.2 3.1 3.9   GFR Estimated Creatinine Clearance: 53.2 mL/min (by C-G formula based on SCr of 0.66 mg/dL). Liver Function Tests: Recent Labs  Lab 07/24/23 2250 07/25/23 0500  AST 42* 36  ALT 15 14  ALKPHOS 64 58  BILITOT 0.8 1.0  PROT 6.3* 5.9*  ALBUMIN 3.4* 3.1*   No results for input(s): "LIPASE", "AMYLASE" in the last 168 hours. Recent Labs  Lab 07/24/23 2255  AMMONIA 25   Coagulation profile Recent Labs  Lab 07/24/23 2250  INR 1.0   COVID-19 Labs  No results for input(s): "DDIMER", "FERRITIN", "LDH", "CRP" in the last 72 hours.   Lab Results  Component Value Date   SARSCOV2NAA NEGATIVE 02/15/2021   SARSCOV2NAA NEGATIVE 02/10/2021   SARSCOV2NAA NEGATIVE 09/26/2020   SARSCOV2NAA NEGATIVE 03/16/2019    CBC: Recent Labs  Lab 07/24/23 1049 07/24/23 1055 07/26/23 0756 07/27/23 0548 07/28/23 0527 07/29/23 0228 07/30/23 0227  WBC 8.8   < > 7.8 7.3 8.9 9.4 8.2  NEUTROABS 6.6  --   --   --   --   --   --   HGB 11.6*   < > 12.3 12.5 11.5* 11.4* 10.4*  HCT 34.9*   < > 36.5 37.7 34.1* 33.8* 30.9*  MCV 92.3   < > 91.0 91.3 91.4 92.3 91.2  PLT 178    < > 175 193 234 237 255   < > = values in this interval not displayed.   Cardiac Enzymes: Recent Labs  Lab 07/24/23 2250  CKTOTAL 64   BNP (last 3 results) No results for input(s): "PROBNP" in the last 8760 hours. CBG: Recent Labs  Lab 07/26/23 1641  GLUCAP 119*   D-Dimer: No results for input(s): "DDIMER" in the last 72 hours. Hgb A1c: No results for input(s): "HGBA1C" in the last 72 hours. Lipid Profile: No results for input(s): "CHOL", "HDL", "LDLCALC", "TRIG", "CHOLHDL", "LDLDIRECT" in the last 72 hours. Thyroid  function studies: No results for input(s): "TSH", "T4TOTAL", "T3FREE", "THYROIDAB" in the last 72 hours.  Invalid input(s): "FREET3"  Anemia work up: No results for input(s): "VITAMINB12", "FOLATE", "FERRITIN", "TIBC", "IRON", "RETICCTPCT" in the last 72 hours.  Sepsis Labs: Recent Labs  Lab 07/27/23 0548 07/28/23 0527 07/29/23 0228 07/30/23 0227  WBC 7.3 8.9 9.4 8.2   Microbiology No results found for this or any previous visit (from the past 240 hours).   Medications:    baclofen   10 mg Oral TID   buPROPion   150 mg Oral BH-q7a   busPIRone   15 mg Oral TID   carvedilol   6.25 mg Oral BID WC   chlordiazePOXIDE   5 mg Oral QID   DULoxetine   60 mg Oral BID   feeding supplement  237 mL Oral BID BM   folic acid   1 mg Oral Daily   gabapentin   300 mg Oral BID   multivitamin with minerals  1 tablet Oral Daily   pantoprazole   40 mg Oral BID   polyethylene glycol  17 g Oral BID   potassium chloride   40 mEq Oral Q6H   thiamine   100 mg Oral Daily   Or   thiamine   100 mg Intravenous Daily   Continuous Infusions:  sodium chloride  75 mL/hr at 07/30/23 0614      LOS: 6 days   Dayona Shaheen  [facility]  07/30/2023, 11:18 AM

## 2023-07-30 NOTE — TOC Progression Note (Signed)
 Transition of Care [facility]) - Progression Note    Patient Details  Name: Terri Ayala MRN: [id number] Date of Birth: 09/06/1955  [facility] CM/SW Contact  Carmon Christen, LCSWA Phone Number: 07/30/2023, 12:12 PM  Clinical Narrative:     Due to patients current orientation unable to complete assessment/ discuss PT recs for SNF/dc plan. TOC following to complete assessment when appropriate.    Barriers to Discharge: Continued Medical Work up  Expected Discharge Plan and Services In-house Referral: Clinical Social Work                                             Social Determinants of Health (SDOH) Interventions SDOH Screenings   Food Insecurity: Food Insecurity Present (07/27/2023)  Housing: Low Risk  (07/27/2023)  Transportation Needs: No Transportation Needs (07/27/2023)  Utilities: Not At Risk (07/27/2023)  Alcohol  Screen: Low Risk  (01/22/2023)  Depression (PHQ2-9): Low Risk  (05/01/2023)  Financial Resource Strain: High Risk (04/30/2023)  Physical Activity: Insufficiently Active (04/30/2023)  Social Connections: Moderately Isolated (07/27/2023)  Stress: No Stress Concern Present (04/30/2023)  Tobacco Use: Medium Risk (07/24/2023)  Health Literacy: Adequate Health Literacy (01/23/2023)    Readmission Risk Interventions     No data to display

## 2023-07-31 DIAGNOSIS — F10239 Alcohol dependence with withdrawal, unspecified: Secondary | ICD-10-CM | POA: Diagnosis not present

## 2023-07-31 MED ORDER — QUETIAPINE FUMARATE 25 MG PO TABS
25.0000 mg | ORAL_TABLET | Freq: Every day | ORAL | Status: DC
  Administered 2023-07-31 – 2023-08-06 (×7): 25 mg via ORAL
  Filled 2023-07-31 (×7): qty 1

## 2023-07-31 NOTE — Progress Notes (Signed)
 [facility] PROGRESS NOTE    Progress Note  Terri Ayala  BJY:[id number] DOB: 29-Dec-1955 DOA: 07/24/2023 PCP: Noreene Bearded, PA     Brief Narrative:   Terri Ayala is an 68 y.o. female past medical history significant for depression anxiety PTSD, alcohol  use and hypertension comes to the ED after a fall, he relates he has been drinking heavily before this denies hitting her head or loss of consciousness.   Assessment/Plan:   Alcohol  dependence with withdrawals: Continue with thiamine  and folate. On CIWA protocol.  Will add scheduled Librium  as she remains in withdrawals  Acute metabolic encephalopathy - With significant confusion and presentation, likely due to alcohol  withdrawal, but she remains confused not at baseline, still appearing to be in withdrawals, adding scheduled Librium  . - EEG with no evidence of seizures . - Mild narcotics, stopped IV narcotics, decreased her gabapentin  and Vicodin . - As well there was some concern of hospital delirium so I will add scheduled Seroquel at nighttime  Acute posterior left eighth rib fracture: Appreciated on CT she also has old healed rib fractures. Encouraged use incentive spirometry and flutter valve  Benign essential hypertension: Continue Coreg  twice a day.  GERD: Continue PPI.  Hepatic steatosis In the setting of alcohol  abuse noted.  Protein-calorie malnutrition, moderate (HCC): Failure to thrive Oral intake remains very minimal, continue to encourage supplements, continue with IV fluids - Will start on low-dose mirtazapine  Hypophosphatemia Hypokalemia Hypomagnesemia - Repleted   DVT prophylaxis: Lovenox  Family Communication:none at bedside discussed with brother by phone, he does request to talk to [facility] prior to discharge as she will help to arrange transport and home care Status is: Inpatient Remains inpatient appropriate because: Alcohol  abuse with withdrawals   Code Status:     Code  Status Orders  (From admission, onward)           Start     Ordered   07/24/23 1908  Full code  Continuous       Question:  By:  Answer:  Consent: discussion documented in EHR   07/24/23 1908           Code Status History     Date Active Date Inactive Code Status Order ID Comments User Context   02/16/2022 0018 02/25/2022 1553 Full Code [id number]  Bary Boss, DO Inpatient   01/25/2022 2250 01/29/2022 1848 Full Code [id number]  Roxana Copier, DO ED   01/25/2022 1132 01/25/2022 2250 Full Code [id number]  Lorra Rosella, PA-C ED   06/08/2021 2002 06/11/2021 1758 Full Code [id number]  Sabas Cradle, MD ED   02/11/2021 0223 02/16/2021 2145 Full Code [id number]  Chotiner, Pauline Bos, MD ED   11/21/2020 0917 11/23/2020 1732 Full Code [id number]  Lorita Rosa, MD ED   11/20/2020 2154 11/21/2020 0917 Full Code [id number]  Coretta Dexter, PA ED   09/26/2020 1726 10/01/2020 0452 Full Code [id number]  Marine Sia, MD Inpatient   03/16/2019 2057 03/21/2019 2025 Full Code [id number]  Juliette Oh, MD ED   03/16/2019 1257 03/16/2019 1258 Full Code [id number]  Coretta Dexter, PA ED   02/06/2019 0150 02/08/2019 2211 Full Code [id number]  Vanita Gens, MD ED   05/05/2018 0311 05/08/2018 1507 Full Code [id number]  Dea Evert, DO ED   01/04/2018 1654 01/10/2018 1754 Full Code [id number]  Lorita Rosa, MD ED   12/08/2017 0509 12/10/2017 1937 Full Code [id number]  Lena Qualia, MD ED   11/06/2017 2353 11/09/2017  1602 Full Code [id number]  Pati Bonine, MD ED   04/03/2014 1352 04/07/2014 2119 Full Code [id number]  Alfreida Inches, NP Inpatient   04/03/2014 1352 04/03/2014 1352 Full Code [id number]  Alfreida Inches, NP Inpatient   04/02/2014 2218 04/03/2014 1352 Full Code [id number]  Marlo Simpler ED   06/21/2013 1333 06/21/2013 2103 Full Code [id number]  Mark Sil, MD Inpatient   06/17/2013 1958 06/18/2013 0342 Full Code [id number]  Mee Spillers., MD ED   03/14/2013 1235  03/26/2013 1917 Full Code [id number]  Oza Blumenthal, MD Inpatient         IV Access:   Peripheral IV   Procedures and diagnostic studies:   No results found.     Medical Consultants:   None.   Subjective:    Terri Ayala did pull her IV 2 times twice yesterday, p.o. improved some but remains poor.  Objective:    Vitals:   07/31/23 0610 07/31/23 0936 07/31/23 1218 07/31/23 1219  BP:  120/70 126/80   Pulse: 88 (!) 25 85   Resp:  (!) 23 13   Temp:  98.3 F (36.8 C) 98.5 F (36.9 C)   TempSrc:  Oral Axillary   SpO2:   91% 94%  Weight:      Height:       SpO2: 94 %   Intake/Output Summary (Last 24 hours) at 07/31/2023 1256 Last data filed at 07/31/2023 0330 Gross per 24 hour  Intake 4966.15 ml  Output 900 ml  Net 4066.15 ml    Filed Weights   07/24/23 1002  Weight: 54.4 kg    Exam:  She is awake, alert, but confused, more appropriate . Symmetrical Chest wall movement, Good air movement bilaterally, CTAB RRR,No Gallops,Rubs or new Murmurs, No Parasternal Heave +ve B.Sounds, Abd Soft, No tenderness, No rebound - guarding or rigidity. No Cyanosis, Clubbing or edema, No new Rash or bruise        Data Reviewed:    Labs: Basic Metabolic Panel: Recent Labs  Lab 07/26/23 0756 07/27/23 0548 07/28/23 0527 07/29/23 0228 07/30/23 0227  NA 136 134* 134* 135 136  K 4.3 3.6 3.6 3.7 3.2*  CL 103 104 105 105 104  CO2 21* 21* 18* 21* 22  GLUCOSE 117* 102* 89 120* 114*  BUN 10 10 13 14 10   CREATININE 0.67 0.54 0.65 0.62 0.66  CALCIUM  8.9 8.8* 8.5* 9.0 8.7*  MG 2.1 2.0 1.8 1.5* 1.5*  PHOS 1.9* 3.0 3.2 3.1 3.9   GFR Estimated Creatinine Clearance: 53.2 mL/min (by C-G formula based on SCr of 0.66 mg/dL). Liver Function Tests: Recent Labs  Lab 07/24/23 2250 07/25/23 0500  AST 42* 36  ALT 15 14  ALKPHOS 64 58  BILITOT 0.8 1.0  PROT 6.3* 5.9*  ALBUMIN 3.4* 3.1*   No results for input(s): "LIPASE", "AMYLASE" in the last 168 hours. Recent  Labs  Lab 07/24/23 2255  AMMONIA 25   Coagulation profile Recent Labs  Lab 07/24/23 2250  INR 1.0   COVID-19 Labs  No results for input(s): "DDIMER", "FERRITIN", "LDH", "CRP" in the last 72 hours.   Lab Results  Component Value Date   SARSCOV2NAA NEGATIVE 02/15/2021   SARSCOV2NAA NEGATIVE 02/10/2021   SARSCOV2NAA NEGATIVE 09/26/2020   SARSCOV2NAA NEGATIVE 03/16/2019    CBC: Recent Labs  Lab 07/26/23 0756 07/27/23 0548 07/28/23 0527 07/29/23 0228 07/30/23 0227  WBC 7.8 7.3 8.9 9.4 8.2  HGB 12.3 12.5 11.5* 11.4* 10.4*  HCT 36.5  37.7 34.1* 33.8* 30.9*  MCV 91.0 91.3 91.4 92.3 91.2  PLT 175 193 234 237 255   Cardiac Enzymes: Recent Labs  Lab 07/24/23 2250  CKTOTAL 64   BNP (last 3 results) No results for input(s): "PROBNP" in the last 8760 hours. CBG: Recent Labs  Lab 07/26/23 1641  GLUCAP 119*   D-Dimer: No results for input(s): "DDIMER" in the last 72 hours. Hgb A1c: No results for input(s): "HGBA1C" in the last 72 hours. Lipid Profile: No results for input(s): "CHOL", "HDL", "LDLCALC", "TRIG", "CHOLHDL", "LDLDIRECT" in the last 72 hours. Thyroid  function studies: No results for input(s): "TSH", "T4TOTAL", "T3FREE", "THYROIDAB" in the last 72 hours.  Invalid input(s): "FREET3"  Anemia work up: No results for input(s): "VITAMINB12", "FOLATE", "FERRITIN", "TIBC", "IRON", "RETICCTPCT" in the last 72 hours.  Sepsis Labs: Recent Labs  Lab 07/27/23 0548 07/28/23 0527 07/29/23 0228 07/30/23 0227  WBC 7.3 8.9 9.4 8.2   Microbiology No results found for this or any previous visit (from the past 240 hours).   Medications:    baclofen   10 mg Oral TID   buPROPion   150 mg Oral BH-q7a   busPIRone   15 mg Oral TID   carvedilol   6.25 mg Oral BID WC   chlordiazePOXIDE   5 mg Oral QID   DULoxetine   60 mg Oral BID   feeding supplement  237 mL Oral BID BM   folic acid   1 mg Oral Daily   gabapentin   300 mg Oral BID   multivitamin with minerals  1  tablet Oral Daily   pantoprazole   40 mg Oral BID   polyethylene glycol  17 g Oral BID   thiamine   100 mg Oral Daily   Or   thiamine   100 mg Intravenous Daily   Continuous Infusions:  sodium chloride  Stopped (07/30/23 1215)      LOS: 7 days   Jai Steil  [facility]  07/31/2023, 12:56 PM

## 2023-07-31 NOTE — Plan of Care (Signed)

## 2023-07-31 NOTE — Progress Notes (Signed)
 Physical Therapy Treatment Patient Details Name: Terri Ayala MRN: [id number] DOB: 1955-11-03 Today's Date: 07/31/2023   History of Present Illness 68 y.o. female adm 5/26 with medical history significant of depression, anxiety, PTSD, heavy alcohol  use, hypertension neuropathy migraines.  Presented after a fall at home down 5 steps.  Imaging reveals Acute displaced fracture of the posterior left eighth rib.    PT Comments  Patient is lethargic and needs encouragement to participate with mobility efforts. Max A for sitting up on edge of bed with poor sitting balance. Sitting tolerance no more than a few minutes and patient declined standing/transfers despite encouragement. Slow overall progress. Recommend to continue PT to maximize independence and decrease caregiver burden.    If plan is discharge home, recommend the following: Two people to help with walking and/or transfers;Two people to help with bathing/dressing/bathroom;Assistance with cooking/housework;Assistance with feeding;Direct supervision/assist for medications management;Assist for transportation;Help with stairs or ramp for entrance;Supervision due to cognitive status   Can travel by private vehicle     No  Equipment Recommendations  None recommended by PT    Recommendations for Other Services       Precautions / Restrictions Precautions Precautions: Fall Recall of Precautions/Restrictions: Intact Precaution/Restrictions Comments: ETOH Withdrawals Restrictions Weight Bearing Restrictions Per Provider Order: No     Mobility  Bed Mobility Overal bed mobility: Needs Assistance Bed Mobility: Supine to Sit, Sit to Supine Rolling: Contact guard assist   Supine to sit: Max assist Sit to supine: Max assist   General bed mobility comments: assistance for LE and trunk support. cues for task initiation and sequencing. patient prefers to get up on right side of bed for comfort due to L flank pain. 2 bouts of bed mobility  performed    Transfers                   General transfer comment: patient refused to attempt standing and needs maximal encourgaement just to remain seated on edge of bed. transfers not attempted    Ambulation/Gait                   Stairs             Wheelchair Mobility     Tilt Bed    Modified Rankin (Stroke Patients Only)       Balance Overall balance assessment: Needs assistance Sitting-balance support: Bilateral upper extremity supported, Feet unsupported Sitting balance-Leahy Scale: Poor Sitting balance - Comments: heavy reliance on BUE for support in sitting. right lean and resistive to sitting up more than a few minutes despite encouragement, distraction, etc. Postural control: Right lateral lean                                  Communication Communication Communication: Impaired Factors Affecting Communication: Difficulty expressing self  Cognition Arousal: Lethargic Behavior During Therapy: Flat affect   PT - Cognitive impairments: Orientation, Awareness, Memory, Attention, Initiation, Sequencing, Safety/Judgement, Problem solving, No family/caregiver present to determine baseline   Orientation impairments: Place, Time, Situation                   PT - Cognition Comments: patient has eyes closed intermittently. needs encouragement for participation Following commands: Impaired Following commands impaired: Follows one step commands inconsistently    Cueing Cueing Techniques: Verbal cues, Gestural cues, Tactile cues  Exercises      General Comments General comments (skin integrity, edema,  etc.): vitals stable throughout session      Pertinent Vitals/Pain Pain Assessment Pain Assessment: Faces Faces Pain Scale: Hurts little more Pain Location: L flank with bed mobility Pain Descriptors / Indicators: Discomfort, Grimacing, Guarding Pain Intervention(s): Monitored during session, Limited activity within  patient's tolerance, Repositioned    Home Living                          Prior Function            PT Goals (current goals can now be found in the care plan section) Acute Rehab PT Goals Patient Stated Goal: none stated PT Goal Formulation: Patient unable to participate in goal setting Time For Goal Achievement: 08/09/23 Potential to Achieve Goals: Fair Progress towards PT goals: Progressing toward goals    Frequency    Min 2X/week      PT Plan      Co-evaluation              AM-PAC PT "6 Clicks" Mobility   Outcome Measure  Help needed turning from your back to your side while in a flat bed without using bedrails?: A Little Help needed moving from lying on your back to sitting on the side of a flat bed without using bedrails?: Total Help needed moving to and from a bed to a chair (including a wheelchair)?: Total Help needed standing up from a chair using your arms (e.g., wheelchair or bedside chair)?: Total Help needed to walk in hospital room?: Total Help needed climbing 3-5 steps with a railing? : Total 6 Click Score: 8    End of Session   Activity Tolerance: Patient limited by lethargy;Patient limited by fatigue Patient left: in bed;with call bell/phone within reach;with bed alarm set Nurse Communication: Mobility status PT Visit Diagnosis: Other abnormalities of gait and mobility (R26.89);Muscle weakness (generalized) (M62.81);Difficulty in walking, not elsewhere classified (R26.2);Unsteadiness on feet (R26.81);Pain;History of falling (Z91.81)     Time: 9562-1308 PT Time Calculation (min) (ACUTE ONLY): 10 min  Charges:    $Therapeutic Activity: 8-22 mins PT General Charges $$ ACUTE PT VISIT: 1 Visit                     Ozie Bo, PT, MPT    Erlene Hawks 07/31/2023, 1:39 PM

## 2023-07-31 NOTE — Plan of Care (Signed)

## 2023-08-01 DIAGNOSIS — F10239 Alcohol dependence with withdrawal, unspecified: Secondary | ICD-10-CM | POA: Diagnosis not present

## 2023-08-01 LAB — PHOSPHORUS: Phosphorus: 5.4 mg/dL — ABNORMAL HIGH (ref 2.5–4.6)

## 2023-08-01 LAB — BASIC METABOLIC PANEL WITH GFR
Anion gap: 12 (ref 5–15)
BUN: 8 mg/dL (ref 8–23)
CO2: 24 mmol/L (ref 22–32)
Calcium: 9 mg/dL (ref 8.9–10.3)
Chloride: 103 mmol/L (ref 98–111)
Creatinine, Ser: 0.78 mg/dL (ref 0.44–1.00)
GFR, Estimated: >60 mL/min (ref >60)
Glucose, Bld: 101 mg/dL — ABNORMAL HIGH (ref 70–99)
Potassium: 4.8 mmol/L (ref 3.5–5.1)
Sodium: 139 mmol/L (ref 135–145)

## 2023-08-01 LAB — CBC
HCT: 35.2 % — ABNORMAL LOW (ref 36.0–46.0)
Hemoglobin: 11.6 g/dL — ABNORMAL LOW (ref 12.0–15.0)
MCH: 30.8 pg (ref 26.0–34.0)
MCHC: 33 g/dL (ref 30.0–36.0)
MCV: 93.4 fL (ref 80.0–100.0)
Platelets: 342 10*3/uL (ref 150–400)
RBC: 3.77 MIL/uL — ABNORMAL LOW (ref 3.87–5.11)
RDW: 14.6 % (ref 11.5–15.5)
WBC: 7.7 10*3/uL (ref 4.0–10.5)
nRBC: 0 % (ref 0.0–0.2)

## 2023-08-01 LAB — MAGNESIUM: Magnesium: 1.9 mg/dL (ref 1.7–2.4)

## 2023-08-01 MED ORDER — ENSURE PLUS HIGH PROTEIN PO LIQD
237.0000 mL | Freq: Three times a day (TID) | ORAL | Status: DC
  Administered 2023-08-01 – 2023-08-06 (×17): 237 mL via ORAL

## 2023-08-01 NOTE — TOC Progression Note (Signed)
 Transition of Care [facility]) - Progression Note    Patient Details  Name: Terri Ayala MRN: [id number] Date of Birth: 09/29/1955  Transition of Care [facility]) CM/SW Contact  Jannice Mends, LCSW Phone Number: 08/01/2023, 9:09 AM  Clinical Narrative:    CSW continuing to follow for progress. CIWA score 5.      Barriers to Discharge: Continued Medical Work up  Expected Discharge Plan and Services In-house Referral: Clinical Social Work                                             Social Determinants of Health (SDOH) Interventions SDOH Screenings   Food Insecurity: Food Insecurity Present (07/27/2023)  Housing: Low Risk  (07/27/2023)  Transportation Needs: No Transportation Needs (07/27/2023)  Utilities: Not At Risk (07/27/2023)  Alcohol  Screen: Low Risk  (01/22/2023)  Depression (PHQ2-9): Low Risk  (05/01/2023)  Financial Resource Strain: High Risk (04/30/2023)  Physical Activity: Insufficiently Active (04/30/2023)  Social Connections: Moderately Isolated (07/27/2023)  Stress: No Stress Concern Present (04/30/2023)  Tobacco Use: Medium Risk (07/24/2023)  Health Literacy: Adequate Health Literacy (01/23/2023)    Readmission Risk Interventions     No data to display

## 2023-08-01 NOTE — Progress Notes (Signed)
 [facility] PROGRESS NOTE    Progress Note  Terri Ayala  ZOX:[id number] DOB: [birth year] DOA: 07/24/2023 PCP: Noreene Bearded, PA     Brief Narrative:   Terri Ayala is an 68 y.o. female past medical history significant for depression anxiety PTSD, alcohol  use and hypertension comes to the ED after a fall, he relates he has been drinking heavily before this denies hitting her head or loss of consciousness.  She had admitted for alcohol  withdrawals.   Assessment/Plan:   Alcohol  dependence with withdrawals: Continue with thiamine  and folate. She remains on CIWA protocol with significant demand, so she was started on scheduled Librium , finally over the last 1 or 2 days he is more awake and coherent, so hopefully will be able to start tapering Librium  soon.  Acute metabolic encephalopathy - With significant confusion and presentation, likely due to alcohol  withdrawal, but she remains confused not at baseline, still appearing to be in withdrawals, adding scheduled Librium  . - EEG with no evidence of seizures . - Mild narcotics, stopped IV narcotics, decreased her gabapentin  and Vicodin . - As well there was some concern of hospital delirium so I did add scheduled Seroquel at nighttime, mentation has much improved after started Seroquel. - I have discussed with staff, to encourage to ambulate and encourage her p.o. intake.  Acute posterior left eighth rib fracture: Appreciated on CT she also has old healed rib fractures. Encouraged use incentive spirometry and flutter valve  Benign essential hypertension: Continue Coreg  twice a day.  GERD: Continue PPI.  Hepatic steatosis In the setting of alcohol  abuse noted.  Protein-calorie malnutrition, moderate (HCC): Failure to thrive Oral intake remains very minimal, continue to encourage supplements, continue with IV fluids - Will start on low-dose mirtazapine  Hypophosphatemia Hypokalemia Hypomagnesemia - Repleted   DVT  prophylaxis: Lovenox  Family Communication:none at bedside, discussed with brother by phone 6/2, he does request to talk to [facility] prior to discharge as she will help to arrange transport and home care Status is: Inpatient Remains inpatient appropriate because: Alcohol  abuse with withdrawals   Code Status:     Code Status Orders  (From admission, onward)           Start     Ordered   07/24/23 1908  Full code  Continuous       Question:  By:  Answer:  Consent: discussion documented in EHR   07/24/23 1908           Code Status History     Date Active Date Inactive Code Status Order ID Comments User Context   02/16/2022 0018 02/25/2022 1553 Full Code [id number]  Bary Boss, DO Inpatient   01/25/2022 2250 01/29/2022 1848 Full Code [id number]  Roxana Copier, DO ED   01/25/2022 1132 01/25/2022 2250 Full Code [id number]  Lorra Rosella, PA-C ED   06/08/2021 2002 06/11/2021 1758 Full Code [id number]  Sabas Cradle, MD ED   02/11/2021 0223 02/16/2021 2145 Full Code [id number]  Chotiner, Pauline Bos, MD ED   11/21/2020 0917 11/23/2020 1732 Full Code [id number]  Lorita Rosa, MD ED   11/20/2020 2154 11/21/2020 0917 Full Code [id number]  Coretta Dexter, PA ED   09/26/2020 1726 10/01/2020 0452 Full Code [id number]  Marine Sia, MD Inpatient   03/16/2019 2057 03/21/2019 2025 Full Code [id number]  Juliette Oh, MD ED   03/16/2019 1257 03/16/2019 1258 Full Code [id number]  Coretta Dexter, PA ED   02/06/2019 0150 02/08/2019 2211 Full  Code [id number]  Vanita Gens, MD ED   05/05/2018 0311 05/08/2018 1507 Full Code [id number]  Dea Evert, DO ED   01/04/2018 1654 01/10/2018 1754 Full Code [id number]  Lorita Rosa, MD ED   12/08/2017 0509 12/10/2017 1937 Full Code [id number]  Lena Qualia, MD ED   11/06/2017 2353 11/09/2017 1602 Full Code [id number]  Pati Bonine, MD ED   04/03/2014 1352 04/07/2014 2119 Full Code [id number]  Alfreida Inches, NP Inpatient   04/03/2014  1352 04/03/2014 1352 Full Code [id number]  Alfreida Inches, NP Inpatient   04/02/2014 2218 04/03/2014 1352 Full Code [id number]  Marlo Simpler ED   06/21/2013 1333 06/21/2013 2103 Full Code [id number]  Mark Sil, MD Inpatient   06/17/2013 1958 06/18/2013 0342 Full Code [id number]  Mee Spillers., MD ED   03/14/2013 1235 03/26/2013 1917 Full Code [id number]  Oza Blumenthal, MD Inpatient         IV Access:   Peripheral IV   Procedures and diagnostic studies:   No results found.     Medical Consultants:   None.   Subjective:    Terri Ayala did pull her IV 2 times twice yesterday, p.o. improved some but remains poor.  Objective:    Vitals:   08/01/23 0000 08/01/23 0400 08/01/23 0800 08/01/23 1200  BP: 119/68  (!) 140/97   Pulse: 92 90 95   Resp:   19   Temp: 98.3 F (36.8 C) 98.2 F (36.8 C) 98.5 F (36.9 C) 98.6 F (37 C)  TempSrc: Oral Oral Oral Oral  SpO2: 95% 95% 95%   Weight:      Height:       SpO2: 95 %  No intake or output data in the 24 hours ending 08/01/23 1313   Filed Weights   07/24/23 1002  Weight: 54.4 kg    Exam:  Awake Alert, Oriented X 3, he is more coherent and awake and communicative today, remains with mild confusion, frail Symmetrical Chest wall movement, Good air movement bilaterally, CTAB RRR,No Gallops,Rubs or new Murmurs, No Parasternal Heave +ve B.Sounds, Abd Soft, No tenderness, No rebound - guarding or rigidity. No Cyanosis, Clubbing or edema, No new Rash or bruise        Data Reviewed:    Labs: Basic Metabolic Panel: Recent Labs  Lab 07/27/23 0548 07/28/23 0527 07/29/23 0228 07/30/23 0227 08/01/23 0430  NA 134* 134* 135 136 139  K 3.6 3.6 3.7 3.2* 4.8  CL 104 105 105 104 103  CO2 21* 18* 21* 22 24  GLUCOSE 102* 89 120* 114* 101*  BUN 10 13 14 10 8   CREATININE 0.54 0.65 0.62 0.66 0.78  CALCIUM  8.8* 8.5* 9.0 8.7* 9.0  MG 2.0 1.8 1.5* 1.5* 1.9  PHOS 3.0 3.2 3.1 3.9 5.4*   GFR Estimated  Creatinine Clearance: 53.2 mL/min (by C-G formula based on SCr of 0.78 mg/dL). Liver Function Tests: No results for input(s): "AST", "ALT", "ALKPHOS", "BILITOT", "PROT", "ALBUMIN" in the last 168 hours.  No results for input(s): "LIPASE", "AMYLASE" in the last 168 hours. No results for input(s): "AMMONIA" in the last 168 hours.  Coagulation profile No results for input(s): "INR", "PROTIME" in the last 168 hours.  COVID-19 Labs  No results for input(s): "DDIMER", "FERRITIN", "LDH", "CRP" in the last 72 hours.   Lab Results  Component Value Date   SARSCOV2NAA NEGATIVE 02/15/2021   SARSCOV2NAA NEGATIVE 02/10/2021   SARSCOV2NAA NEGATIVE 09/26/2020   SARSCOV2NAA NEGATIVE 03/16/2019  CBC: Recent Labs  Lab 07/27/23 0548 07/28/23 0527 07/29/23 0228 07/30/23 0227 08/01/23 0430  WBC 7.3 8.9 9.4 8.2 7.7  HGB 12.5 11.5* 11.4* 10.4* 11.6*  HCT 37.7 34.1* 33.8* 30.9* 35.2*  MCV 91.3 91.4 92.3 91.2 93.4  PLT 193 234 237 255 342   Cardiac Enzymes: No results for input(s): "CKTOTAL", "CKMB", "CKMBINDEX", "TROPONINI" in the last 168 hours.  BNP (last 3 results) No results for input(s): "PROBNP" in the last 8760 hours. CBG: Recent Labs  Lab 07/26/23 1641  GLUCAP 119*   D-Dimer: No results for input(s): "DDIMER" in the last 72 hours. Hgb A1c: No results for input(s): "HGBA1C" in the last 72 hours. Lipid Profile: No results for input(s): "CHOL", "HDL", "LDLCALC", "TRIG", "CHOLHDL", "LDLDIRECT" in the last 72 hours. Thyroid  function studies: No results for input(s): "TSH", "T4TOTAL", "T3FREE", "THYROIDAB" in the last 72 hours.  Invalid input(s): "FREET3"  Anemia work up: No results for input(s): "VITAMINB12", "FOLATE", "FERRITIN", "TIBC", "IRON", "RETICCTPCT" in the last 72 hours.  Sepsis Labs: Recent Labs  Lab 07/28/23 0527 07/29/23 0228 07/30/23 0227 08/01/23 0430  WBC 8.9 9.4 8.2 7.7   Microbiology No results found for this or any previous visit (from the past  240 hours).   Medications:    baclofen   10 mg Oral TID   buPROPion   150 mg Oral BH-q7a   busPIRone   15 mg Oral TID   carvedilol   6.25 mg Oral BID WC   chlordiazePOXIDE   5 mg Oral QID   DULoxetine   60 mg Oral BID   feeding supplement  237 mL Oral TID BM   folic acid   1 mg Oral Daily   gabapentin   300 mg Oral BID   multivitamin with minerals  1 tablet Oral Daily   pantoprazole   40 mg Oral BID   QUEtiapine  25 mg Oral QHS   thiamine   100 mg Oral Daily   Continuous Infusions:  sodium chloride  Stopped (07/30/23 1215)      LOS: 8 days   Rosalynn Sergent  [facility]  08/01/2023, 1:13 PM

## 2023-08-01 NOTE — Plan of Care (Signed)

## 2023-08-01 NOTE — Progress Notes (Addendum)
 Nutrition Follow-up  DOCUMENTATION CODES:  Non-severe (moderate) malnutrition in context of social or environmental circumstances  INTERVENTION:  Continue Ensure Enlive po BID, each supplement provides 350 kcal and 20 grams of protein. Continue Magic cup TID with meals, each supplement provides 290 kcal and 9 grams of protein Continue MVI with minerals daily Continue automatic meal trays to avoid missed meals Continue assistance with meals to optimize intake Collect new weight to assess trend this admission  NUTRITION DIAGNOSIS:  Moderate Malnutrition related to social / environmental circumstances (EtOH use) as evidenced by moderate muscle depletion, moderate fat depletion. - remains applicable  GOAL:  Patient will meet greater than or equal to 90% of their needs - progressing  MONITOR:  PO intake, Supplement acceptance, Labs, Skin  REASON FOR ASSESSMENT:  Consult Assessment of nutrition requirement/status  ASSESSMENT:   Pt with PMH significant for: depression, anxiety, PTSD, EtOH use and HTN. Presented to ED s/p fall. Attributes this to heavy drinking.  Mentation improving w/ seroquel and librium . Remains on CIWA protocol. Staff encouraging PO intake and mobility. Discussed the importance of protein and calorie intake with patient at bedside.   Average Meal Intake 5/28: 0-15% x2 documented meals 5/30: 25-50% x3 documented meals 6/1: 30% x1 documented meal 6/3: 15% x1 documented meal   Documented intake increased, but remains inadequate. Staff encouraging intake and providing assistance with meals. Good acceptance of Ensure Plus High Protein x2 daily.    Admit/Current Weight: 54.4kg - needs new weight; ordered re-entered   Needs a new weight collected, as there has not been one in one week since admission. Will re-order. Bowels stable. Last BM yesterday. Miralax  ordered 6/1 BID for four occurrences.   Thiamine  and folate continue. MVI in place. Sodium improved. Potassium  and phosphorus have been supplemented. Continue to monitor electrolytes.   Meds: folic acid , thiamine , MVI, pantoprazole , K PHOS    Labs: Na+ 134>135>136>139 (wdl) K+ 3.2>4.8(wdl) Mg 1.5>1.9 (wdl) PHOS 3.9>5.4(wdl) CBGs 101-114 x48 hours   Diet Order:   Diet Order             Diet Heart Room service appropriate? No; Fluid consistency: Thin  Diet effective now            EDUCATION NEEDS:   Not appropriate for education at this time  Skin:  Skin Assessment: Reviewed RN Assessment  Last BM:  6/3 - type 5 x1  Height:  Ht Readings from Last 1 Encounters:  07/24/23 5\' 2"  (1.575 m)   Weight:  Wt Readings from Last 1 Encounters:  07/24/23 54.4 kg   Ideal Body Weight:  50 kg  BMI:  Body mass index is 21.95 kg/m.  Estimated Nutritional Needs:   Kcal:  1600-1800kcals  Protein:  75-90g  Fluid:  >1.5L/day  Con Decant MS, RD, LDN Registered Dietitian Clinical Nutrition RD Inpatient Contact Info in Amion

## 2023-08-02 DIAGNOSIS — F10930 Alcohol use, unspecified with withdrawal, uncomplicated: Secondary | ICD-10-CM | POA: Diagnosis not present

## 2023-08-02 DIAGNOSIS — K219 Gastro-esophageal reflux disease without esophagitis: Secondary | ICD-10-CM | POA: Diagnosis not present

## 2023-08-02 DIAGNOSIS — E43 Unspecified severe protein-calorie malnutrition: Secondary | ICD-10-CM | POA: Diagnosis not present

## 2023-08-02 DIAGNOSIS — S2232XA Fracture of one rib, left side, initial encounter for closed fracture: Secondary | ICD-10-CM | POA: Diagnosis not present

## 2023-08-02 LAB — CBC
HCT: 36.2 % (ref 36.0–46.0)
Hemoglobin: 11.9 g/dL — ABNORMAL LOW (ref 12.0–15.0)
MCH: 30.6 pg (ref 26.0–34.0)
MCHC: 32.9 g/dL (ref 30.0–36.0)
MCV: 93.1 fL (ref 80.0–100.0)
Platelets: 328 10*3/uL (ref 150–400)
RBC: 3.89 MIL/uL (ref 3.87–5.11)
RDW: 14.6 % (ref 11.5–15.5)
WBC: 7.4 10*3/uL (ref 4.0–10.5)
nRBC: 0 % (ref 0.0–0.2)

## 2023-08-02 LAB — BASIC METABOLIC PANEL WITH GFR
Anion gap: 9 (ref 5–15)
BUN: 11 mg/dL (ref 8–23)
CO2: 23 mmol/L (ref 22–32)
Calcium: 9.2 mg/dL (ref 8.9–10.3)
Chloride: 106 mmol/L (ref 98–111)
Creatinine, Ser: 0.72 mg/dL (ref 0.44–1.00)
GFR, Estimated: >60 mL/min (ref >60)
Glucose, Bld: 104 mg/dL — ABNORMAL HIGH (ref 70–99)
Potassium: 4 mmol/L (ref 3.5–5.1)
Sodium: 138 mmol/L (ref 135–145)

## 2023-08-02 LAB — PHOSPHORUS: Phosphorus: 5.9 mg/dL — ABNORMAL HIGH (ref 2.5–4.6)

## 2023-08-02 LAB — MAGNESIUM: Magnesium: 1.9 mg/dL (ref 1.7–2.4)

## 2023-08-02 MED ORDER — ENOXAPARIN SODIUM 30 MG/0.3ML IJ SOSY
30.0000 mg | PREFILLED_SYRINGE | INTRAMUSCULAR | Status: DC
  Administered 2023-08-02 – 2023-08-06 (×5): 30 mg via SUBCUTANEOUS
  Filled 2023-08-02 (×6): qty 0.3

## 2023-08-02 MED ORDER — CHLORDIAZEPOXIDE HCL 5 MG PO CAPS
5.0000 mg | ORAL_CAPSULE | Freq: Three times a day (TID) | ORAL | Status: DC
  Administered 2023-08-02 (×2): 5 mg via ORAL
  Filled 2023-08-02 (×2): qty 1

## 2023-08-02 NOTE — Progress Notes (Signed)
 Occupational Therapy Treatment Patient Details Name: Terri Ayala MRN: [id number] DOB: September 17, 1955 Today's Date: 08/02/2023   History of present illness 68 y.o. female adm 5/26 with medical history significant of depression, anxiety, PTSD, heavy alcohol  use, hypertension neuropathy migraines.  Presented after a fall at home down 5 steps.  Imaging reveals Acute displaced fracture of the posterior left eighth rib.   OT comments  Patient received in supine and eager to participate with OT/PT treatment. Patient continues with left flank pain and was educated on bed mobility to minimize pain. Patient performed gown change UB bathing on EOB before performing transfer to recliner with +2 mod assist with RW. LB dressing performed with changing scrub bottoms. Patient was left up in recliner with increased comfort. Patient will benefit from continued inpatient follow up therapy, <3 hours/day. Acute OT to continue to follow to address established goals to facilitate DC to next venue of care.        If plan is discharge home, recommend the following:  Assist for transportation;Assistance with cooking/housework;Two people to help with walking and/or transfers;A lot of help with bathing/dressing/bathroom;Direct supervision/assist for medications management;Supervision due to cognitive status   Equipment Recommendations  None recommended by OT    Recommendations for Other Services      Precautions / Restrictions Precautions Precautions: Fall Recall of Precautions/Restrictions: Intact Restrictions Weight Bearing Restrictions Per Provider Order: No       Mobility Bed Mobility Overal bed mobility: Needs Assistance Bed Mobility: Rolling, Sidelying to Sit Rolling: Mod assist Sidelying to sit: Mod assist       General bed mobility comments: cues for technique and assistance to minimize right flank pain    Transfers Overall transfer level: Needs assistance Equipment used: Rolling walker (2  wheels) Transfers: Sit to/from Stand, Bed to chair/wheelchair/BSC Sit to Stand: Min assist, Mod assist, +2 physical assistance     Step pivot transfers: Mod assist, +2 physical assistance     General transfer comment: cues for sequencing, hand placement, anterior weight shifting     Balance Overall balance assessment: Needs assistance Sitting-balance support: Feet supported Sitting balance-Leahy Scale: Fair     Standing balance support: Bilateral upper extremity supported Standing balance-Leahy Scale: Poor Standing balance comment: reliant on UE support when standing, stood briefly to assist with pulling up scrub pants with one extremity support                           ADL either performed or assessed with clinical judgement   ADL Overall ADL's : Needs assistance/impaired     Grooming: Wash/dry hands;Wash/dry face;Set up;Sitting   Upper Body Bathing: Supervision/ safety;Sitting   Lower Body Bathing: Moderate assistance   Upper Body Dressing : Minimal assistance;Sitting   Lower Body Dressing: Moderate assistance;Sit to/from stand Lower Body Dressing Details (indicate cue type and reason): assistance to thead legs into clothing and to pull up clothing while standing               General ADL Comments: Patient found in wet bed and performed washing, gown change, and donned paper scrub pants    Extremity/Trunk Assessment              Vision       Perception     Praxis     Communication Communication Communication: No apparent difficulties   Cognition Arousal: Alert Behavior During Therapy: Flat affect Cognition: Cognition impaired, Difficult to assess     Awareness: Intellectual  awareness intact Memory impairment (select all impairments): Short-term memory Attention impairment (select first level of impairment): Sustained attention Executive functioning impairment (select all impairments): Reasoning, Problem solving                    Following commands: Impaired Following commands impaired: Follows one step commands with increased time      Cueing   Cueing Techniques: Verbal cues, Gestural cues  Exercises      Shoulder Instructions       General Comments      Pertinent Vitals/ Pain       Pain Assessment Pain Assessment: Faces Faces Pain Scale: Hurts even more Pain Location: L flank with movement Pain Descriptors / Indicators: Discomfort, Grimacing, Guarding Pain Intervention(s): Limited activity within patient's tolerance, Monitored during session, Repositioned, Premedicated before session  Home Living                                          Prior Functioning/Environment              Frequency  Min 2X/week        Progress Toward Goals  OT Goals(current goals can now be found in the care plan section)  Progress towards OT goals: Progressing toward goals  Acute Rehab OT Goals OT Goal Formulation: With patient Time For Goal Achievement: 08/08/23 Potential to Achieve Goals: Fair  Plan      Co-evaluation    PT/OT/SLP Co-Evaluation/Treatment: Yes Reason for Co-Treatment: To address functional/ADL transfers PT goals addressed during session: Mobility/safety with mobility OT goals addressed during session: ADL's and self-care      AM-PAC OT "6 Clicks" Daily Activity     Outcome Measure   Help from another person eating meals?: A Little Help from another person taking care of personal grooming?: A Little Help from another person toileting, which includes using toliet, bedpan, or urinal?: A Lot Help from another person bathing (including washing, rinsing, drying)?: A Lot Help from another person to put on and taking off regular upper body clothing?: A Lot Help from another person to put on and taking off regular lower body clothing?: A Lot 6 Click Score: 14    End of Session Equipment Utilized During Treatment: Rolling walker (2 wheels)  OT Visit  Diagnosis: Unsteadiness on feet (R26.81);Muscle weakness (generalized) (M62.81);History of falling (Z91.81);Other symptoms and signs involving cognitive function   Activity Tolerance Patient tolerated treatment well   Patient Left in chair;with call bell/phone within reach;with chair alarm set   Nurse Communication Mobility status        Time: 0936-1000 OT Time Calculation (min): 24 min  Charges: OT General Charges $OT Visit: 1 Visit OT Treatments $Self Care/Home Management : 8-22 mins  Anitra Barn, OTA Acute Rehabilitation Services  Office [phone]   Jovita Nipper 08/02/2023, 1:24 PM

## 2023-08-02 NOTE — Progress Notes (Signed)
 Physical Therapy Treatment Patient Details Name: Terri Ayala MRN: [id number] DOB: [birth year] Today's Date: 08/02/2023   History of Present Illness 68 y.o. female adm 5/26 with medical history significant of depression, anxiety, PTSD, heavy alcohol  use, hypertension neuropathy migraines.  Presented after a fall at home down 5 steps.  Imaging reveals Acute displaced fracture of the posterior left eighth rib.    PT Comments  Patient is agreeable to PT session with improved level of alertness and participation. She reports continued L flank pain that is worse with mobility. Several standing bouts performed with assistance. Patient is able to walk a short distance with +2 person assistance, activity tolerance limited by fatigue and pain. Recommend to continue PT to maximize independence. Rehabilitation < 3 hours/day recommended after this hospital stay.    If plan is discharge home, recommend the following: Two people to help with walking and/or transfers;Two people to help with bathing/dressing/bathroom;Assistance with cooking/housework;Assistance with feeding;Direct supervision/assist for medications management;Assist for transportation;Help with stairs or ramp for entrance;Supervision due to cognitive status   Can travel by private vehicle     No  Equipment Recommendations  None recommended by PT    Recommendations for Other Services       Precautions / Restrictions Precautions Precautions: Fall Recall of Precautions/Restrictions: Intact Restrictions Weight Bearing Restrictions Per Provider Order: No     Mobility  Bed Mobility Overal bed mobility: Needs Assistance Bed Mobility: Rolling, Sidelying to Sit Rolling: Mod assist Sidelying to sit: Mod assist       General bed mobility comments: cues for technique to minimize flank pain. increased time and effort required    Transfers Overall transfer level: Needs assistance Equipment used: Rolling walker (2 wheels) Transfers: Sit  to/from Stand, Bed to chair/wheelchair/BSC Sit to Stand: Min assist, Mod assist, +2 physical assistance   Step pivot transfers: Mod assist, +2 physical assistance       General transfer comment: cues for sequencing, hand placement, anterior weight shifting    Ambulation/Gait Ambulation/Gait assistance: Mod assist, +2 physical assistance Gait Distance (Feet): 10 Feet Assistive device: Rolling walker (2 wheels) Gait Pattern/deviations: Decreased stride length, Narrow base of support, Leaning posteriorly Gait velocity: decreased     General Gait Details: cues to increase step length, for anterior weight shifting. activity tolerance limited by pain and fatigue with activity. no dizziness reported with mobility   Stairs             Wheelchair Mobility     Tilt Bed    Modified Rankin (Stroke Patients Only)       Balance Overall balance assessment: Needs assistance Sitting-balance support: Feet supported Sitting balance-Leahy Scale: Fair     Standing balance support: Bilateral upper extremity supported Standing balance-Leahy Scale: Poor Standing balance comment: external support required from therapist in addition to rolling walker                            Communication Communication Communication: No apparent difficulties  Cognition Arousal: Alert Behavior During Therapy: Flat affect   PT - Cognitive impairments: Memory, Sequencing                       PT - Cognition Comments: patient is alert today throughout session. agreeable to mobility with encouragement Following commands: Impaired Following commands impaired: Follows one step commands with increased time    Cueing Cueing Techniques: Verbal cues, Gestural cues  Exercises  General Comments        Pertinent Vitals/Pain Pain Assessment Pain Assessment: Faces Faces Pain Scale: Hurts even more Pain Location: L flank with movement Pain Descriptors / Indicators:  Discomfort, Grimacing, Guarding Pain Intervention(s): Limited activity within patient's tolerance, Monitored during session, Premedicated before session, Repositioned    Home Living                          Prior Function            PT Goals (current goals can now be found in the care plan section) Acute Rehab PT Goals Patient Stated Goal: to go home PT Goal Formulation: With patient Time For Goal Achievement: 08/09/23 Potential to Achieve Goals: Fair Progress towards PT goals: Progressing toward goals    Frequency    Min 2X/week      PT Plan      Co-evaluation PT/OT/SLP Co-Evaluation/Treatment: Yes Reason for Co-Treatment: To address functional/ADL transfers PT goals addressed during session: Mobility/safety with mobility        AM-PAC PT "6 Clicks" Mobility   Outcome Measure  Help needed turning from your back to your side while in a flat bed without using bedrails?: A Little Help needed moving from lying on your back to sitting on the side of a flat bed without using bedrails?: Total Help needed moving to and from a bed to a chair (including a wheelchair)?: Total Help needed standing up from a chair using your arms (e.g., wheelchair or bedside chair)?: Total Help needed to walk in hospital room?: Total Help needed climbing 3-5 steps with a railing? : Total 6 Click Score: 8    End of Session   Activity Tolerance: Patient tolerated treatment well Patient left: in chair;with call bell/phone within reach;with chair alarm set Nurse Communication: Mobility status PT Visit Diagnosis: Other abnormalities of gait and mobility (R26.89);Muscle weakness (generalized) (M62.81);Difficulty in walking, not elsewhere classified (R26.2);Unsteadiness on feet (R26.81);Pain;History of falling (Z91.81)     Time: 0932-1000 PT Time Calculation (min) (ACUTE ONLY): 28 min  Charges:    $Therapeutic Activity: 8-22 mins PT General Charges $$ ACUTE PT VISIT: 1 Visit                      Ozie Bo, PT, MPT    Terri Ayala 08/02/2023, 12:01 PM

## 2023-08-02 NOTE — NC FL2 (Signed)
   MEDICAID FL2 LEVEL OF CARE FORM     IDENTIFICATION  Patient Name: Terri Ayala Birthdate: 12/11/1955 Sex: female Admission Date (Current Location): 07/24/2023  [facility] and [place] Number:  Producer, television/film/video and Address:  [facility], [address], [place], [place] [address]      Provider Number: [id number]  Attending Physician Name and Address:  Burton Casey, MD  Relative Name and Phone Number:       Current Level of Care: Hospital Recommended Level of Care: Skilled Nursing Facility Prior Approval Number:    Date Approved/Denied:   PASRR Number: pending  Discharge Plan: SNF    Current Diagnoses: Patient Active Problem List   Diagnosis Date Noted   Malnutrition of moderate degree 07/28/2023   Alcohol  withdrawal (HCC) 07/24/2023   Elevated TSH 07/24/2023   Sensorineural hearing loss, bilateral 01/01/2023   Right ear pain 01/01/2023   Foreign body of right ear 01/01/2023   Alcoholic peripheral neuropathy (HCC) 12/28/2022   Mild cognitive impairment 10/17/2022   Balance problem 05/23/2022   Abnormal movement 05/23/2022   Weakness of both hands 05/23/2022   Rosacea 05/17/2021   Seborrheic keratosis 05/17/2021   Melanocytic nevi of trunk 05/17/2021   Chronic pain of left ankle 05/12/2021   Body mass index 26.0-26.9, adult 05/12/2021   Thyroid  nodule 02/10/2021   Prolonged QT interval 02/10/2021   Elevated cholesterol 10/19/2020   Gastroparesis 03/25/2020   Gastroesophageal reflux disease without esophagitis 03/25/2020   Elevated LFTs 07/12/2018   Left rib fracture 01/04/2018   Leukocytosis 12/08/2017   Benign essential HTN 12/08/2017   Osteopenia 08/27/2016   Major depressive disorder, recurrent episode, moderate (HCC) 01/27/2016   Alcohol  dependence with withdrawal (HCC) 01/27/2016   Uncomplicated alcohol  dependence (HCC) 04/03/2014   Lumbosacral radiculopathy at L5 05/20/2013   Protein-calorie malnutrition, severe  (HCC) 03/18/2013   Nonspecific elevation of levels of transaminase or lactic acid dehydrogenase (LDH) 03/16/2013   Hepatic steatosis 03/16/2013   Chronic cholecystitis 03/14/2013   Cervical arthritis 10/12/2012   Palpitations 11/29/2010   Supraventricular tachycardia (HCC) 11/29/2010   Mitral valve prolapse 11/29/2010   Decreased libido 11/15/2010   Multinodular goiter 08/17/2009   Migraine 08/17/2009   Hearing loss 08/17/2009   Osteoporosis 08/17/2009   Generalized anxiety disorder 08/17/2009   Eosinophilic esophagitis 06/19/2007    Orientation RESPIRATION BLADDER Height & Weight     Self, Place  Normal Incontinent, External catheter Weight: 120 lb (54.4 kg) Height:  5\' 2"  (157.5 cm)  BEHAVIORAL SYMPTOMS/MOOD NEUROLOGICAL BOWEL NUTRITION STATUS      Incontinent Diet (See dc summary)  AMBULATORY STATUS COMMUNICATION OF NEEDS Skin   Extensive Assist Verbally Normal                       Personal Care Assistance Level of Assistance  Bathing, Feeding, Dressing Bathing Assistance: Maximum assistance Feeding assistance: Limited assistance Dressing Assistance: Limited assistance     Functional Limitations Info  Sight Sight Info: Impaired        SPECIAL CARE FACTORS FREQUENCY  PT (By licensed PT), OT (By licensed OT)     PT Frequency: 5x/week OT Frequency: 5x/week            Contractures Contractures Info: Not present    Additional Factors Info  Code Status, Allergies, Psychotropic Code Status Info: Full Allergies Info: Doxycycline, Tetracycline Hcl, Codeine Psychotropic Info: See dc summary         Current Medications (  08/02/2023):  This is the current hospital active medication list Current Facility-Administered Medications  Medication Dose Route Frequency Provider Last Rate Last Admin   acetaminophen  (TYLENOL ) tablet 650 mg  650 mg Oral Q6H PRN Doutova, Anastassia, MD   650 mg at 07/27/23 4098   Or   acetaminophen  (TYLENOL ) suppository 650 mg  650 mg  Rectal Q6H PRN Doutova, Anastassia, MD       baclofen  (LIORESAL ) tablet 10 mg  10 mg Oral TID Doutova, Anastassia, MD   10 mg at 08/02/23 0900   buPROPion  (WELLBUTRIN  XL) 24 hr tablet 150 mg  150 mg Oral BH-q7a Doutova, Anastassia, MD   150 mg at 08/02/23 1191   busPIRone  (BUSPAR ) tablet 15 mg  15 mg Oral TID Doutova, Anastassia, MD   15 mg at 08/02/23 0900   carvedilol  (COREG ) tablet 6.25 mg  6.25 mg Oral BID WC Doutova, Anastassia, MD   6.25 mg at 08/02/23 0900   chlordiazePOXIDE  (LIBRIUM ) capsule 5 mg  5 mg Oral TID Ghimire, Shanker M, MD       DULoxetine  (CYMBALTA ) DR capsule 60 mg  60 mg Oral BID Doutova, Anastassia, MD   60 mg at 08/02/23 0900   enoxaparin  (LOVENOX ) injection 40 mg  40 mg Subcutaneous Q24H Ghimire, Shanker M, MD       feeding supplement (ENSURE PLUS HIGH PROTEIN) liquid 237 mL  237 mL Oral TID BM Elgergawy, Dawood S, MD   237 mL at 08/01/23 2112   folic acid  (FOLVITE ) tablet 1 mg  1 mg Oral Daily Doutova, Anastassia, MD   1 mg at 08/02/23 0900   gabapentin  (NEURONTIN ) capsule 300 mg  300 mg Oral BID Doutova, Anastassia, MD   300 mg at 08/02/23 0900   HYDROcodone -acetaminophen  (NORCO/VICODIN) 5-325 MG per tablet 1 tablet  1 tablet Oral Q6H PRN Elgergawy, Dawood S, MD   1 tablet at 08/02/23 0900   multivitamin with minerals tablet 1 tablet  1 tablet Oral Daily Doutova, Anastassia, MD   1 tablet at 08/02/23 0900   pantoprazole  (PROTONIX ) EC tablet 40 mg  40 mg Oral BID Doutova, Anastassia, MD   40 mg at 08/02/23 0900   prochlorperazine  (COMPAZINE ) injection 10 mg  10 mg Intravenous Q4H PRN Macdonald Savoy, MD   10 mg at 07/26/23 0734   QUEtiapine (SEROQUEL) tablet 25 mg  25 mg Oral QHS Elgergawy, Dawood S, MD   25 mg at 08/01/23 2105   thiamine  (VITAMIN B1) tablet 100 mg  100 mg Oral Daily Doutova, Anastassia, MD   100 mg at 08/02/23 0900     Discharge Medications: Please see discharge summary for a list of discharge medications.  Relevant Imaging Results:  Relevant  Lab Results:   Additional Information SSn: [id number] Rondell Frick, LCSW

## 2023-08-02 NOTE — Progress Notes (Signed)
 RE: Terri Ayala Date of Birth: 2055-07-17 Date: 08/02/23  Please be advised that the above-named patient will require a short-term nursing home stay - anticipated 30 days or less for rehabilitation and strengthening.  The plan is for return home.

## 2023-08-02 NOTE — Plan of Care (Signed)

## 2023-08-02 NOTE — Progress Notes (Signed)
 PROGRESS NOTE        PATIENT DETAILS Name: Terri Ayala Age: 68 y.o. Sex: female Date of Birth: 1955/03/26 Admit Date: 07/24/2023 Admitting Physician Selene Dais, MD QMV:HQIONGE, [place], PA  Brief Summary: Patient is a 68 y.o.  female with history of EtOH use-presented with fall resulting in rib fracture-Hospital course complicated by alcohol  withdrawal symptoms.  Significant events: 5/26>> admit to [facility].  Significant studies: 5/26>> x-ray pelvis: Right hip degenerative changes. 5/26>> CT head: No acute abnormalities 5/26>> CT C-spine: No fracture/subluxation 5/26>> CT T-spine: Acute displaced left eighth rib fracture. 5/26>> CT chest/abdomen/pelvis: Acute posterior left eighth rib fracture.  Remote healed fractures of the right 5th through 10th posterior lateral ribs.  No acute trauma.  Significant microbiology data: None  Procedures: None  Consults: None  Subjective: Lying comfortably in bed-denies any chest pain or shortness of breath.  Objective: Vitals: Blood pressure (!) 151/91, pulse 94, temperature 98.1 F (36.7 C), temperature source Oral, resp. rate (!) 24, height 5\' 2"  (1.575 m), weight 54.4 kg, SpO2 98%.   Exam: Gen Exam:Alert awake-not in any distress HEENT:atraumatic, normocephalic Chest: B/L clear to auscultation anteriorly CVS:S1S2 regular Abdomen:soft non tender, non distended Extremities:no edema Neurology: Non focal-generalized weakness Skin: no rash  Pertinent Labs/Radiology:    Latest Ref Rng & Units 08/02/2023    4:37 AM 08/01/2023    4:30 AM 07/30/2023    2:27 AM  CBC  WBC 4.0 - 10.5 K/uL 7.4  7.7  8.2   Hemoglobin 12.0 - 15.0 g/dL 95.2  84.1  32.4   Hematocrit 36.0 - 46.0 % 36.2  35.2  30.9   Platelets 150 - 400 K/uL 328  342  255     Lab Results  Component Value Date   NA 138 08/02/2023   K 4.0 08/02/2023   CL 106 08/02/2023   CO2 23 08/02/2023     Assessment/Plan: EtOH withdrawal Acute  alcohol  encephalopathy Much improved-awake/alert this morning Change Librium  to 3 times daily dosing Remains on MVI/thiamine /folic acid   Acute posterior left eighth rib fracture Secondary to EtOH intoxicant related fall Supportive care  HTN BP stable Coreg   GERD PPI  Debility/deconditioning PT/OT eval-SNF recommended  Nutrition Status: Nutrition Problem: Moderate Malnutrition Etiology: social / environmental circumstances (EtOH use) Signs/Symptoms: moderate muscle depletion, moderate fat depletion Interventions: Ensure Enlive (each supplement provides 350kcal and 20 grams of protein), Magic cup, MVI, Refer to RD note for recommendations  Code status:   Code Status: Full Code   DVT Prophylaxis: SCDs Start: 07/24/23 1907   Family Communication: None at bedside   Disposition Plan: Status is: Inpatient Remains inpatient appropriate because: Severity of illness   Planned Discharge Destination:Skilled nursing facility   Diet: Diet Order             Diet Heart Room service appropriate? No; Fluid consistency: Thin  Diet effective now                     Antimicrobial agents: Anti-infectives (From admission, onward)    None        MEDICATIONS: Scheduled Meds:  baclofen   10 mg Oral TID   buPROPion   150 mg Oral BH-q7a   busPIRone   15 mg Oral TID   carvedilol   6.25 mg Oral BID WC   chlordiazePOXIDE   5 mg Oral QID   DULoxetine   60 mg Oral BID   feeding supplement  237 mL Oral TID BM   folic acid   1 mg Oral Daily   gabapentin   300 mg Oral BID   multivitamin with minerals  1 tablet Oral Daily   pantoprazole   40 mg Oral BID   QUEtiapine  25 mg Oral QHS   thiamine   100 mg Oral Daily   Continuous Infusions: PRN Meds:.acetaminophen  **OR** acetaminophen , HYDROcodone -acetaminophen , prochlorperazine    I have personally reviewed following labs and imaging studies  LABORATORY DATA: CBC: Recent Labs  Lab 07/28/23 0527 07/29/23 0228 07/30/23 0227  08/01/23 0430 08/02/23 0437  WBC 8.9 9.4 8.2 7.7 7.4  HGB 11.5* 11.4* 10.4* 11.6* 11.9*  HCT 34.1* 33.8* 30.9* 35.2* 36.2  MCV 91.4 92.3 91.2 93.4 93.1  PLT 234 237 255 342 328    Basic Metabolic Panel: Recent Labs  Lab 07/28/23 0527 07/29/23 0228 07/30/23 0227 08/01/23 0430 08/02/23 0437  NA 134* 135 136 139 138  K 3.6 3.7 3.2* 4.8 4.0  CL 105 105 104 103 106  CO2 18* 21* 22 24 23   GLUCOSE 89 120* 114* 101* 104*  BUN 13 14 10 8 11   CREATININE 0.65 0.62 0.66 0.78 0.72  CALCIUM  8.5* 9.0 8.7* 9.0 9.2  MG 1.8 1.5* 1.5* 1.9 1.9  PHOS 3.2 3.1 3.9 5.4* 5.9*    GFR: Estimated Creatinine Clearance: 53.2 mL/min (by C-G formula based on SCr of 0.72 mg/dL).  Liver Function Tests: No results for input(s): "AST", "ALT", "ALKPHOS", "BILITOT", "PROT", "ALBUMIN" in the last 168 hours. No results for input(s): "LIPASE", "AMYLASE" in the last 168 hours. No results for input(s): "AMMONIA" in the last 168 hours.  Coagulation Profile: No results for input(s): "INR", "PROTIME" in the last 168 hours.  Cardiac Enzymes: No results for input(s): "CKTOTAL", "CKMB", "CKMBINDEX", "TROPONINI" in the last 168 hours.  BNP (last 3 results) No results for input(s): "PROBNP" in the last 8760 hours.  Lipid Profile: No results for input(s): "CHOL", "HDL", "LDLCALC", "TRIG", "CHOLHDL", "LDLDIRECT" in the last 72 hours.  Thyroid  Function Tests: No results for input(s): "TSH", "T4TOTAL", "FREET4", "T3FREE", "THYROIDAB" in the last 72 hours.  Anemia Panel: No results for input(s): "VITAMINB12", "FOLATE", "FERRITIN", "TIBC", "IRON", "RETICCTPCT" in the last 72 hours.  Urine analysis:    Component Value Date/Time   COLORURINE STRAW (A) 02/15/2022 1750   APPEARANCEUR CLEAR 02/15/2022 1750   LABSPEC 1.006 02/15/2022 1750   LABSPEC 1.030 09/01/2017 1443   PHURINE 7.0 02/15/2022 1750   GLUCOSEU NEGATIVE 02/15/2022 1750   HGBUR NEGATIVE 02/15/2022 1750   BILIRUBINUR NEGATIVE 02/15/2022 1750    BILIRUBINUR neg 08/28/2019 1612   KETONESUR 20 (A) 02/15/2022 1750   PROTEINUR NEGATIVE 02/15/2022 1750   UROBILINOGEN 0.2 08/28/2019 1612   UROBILINOGEN 0.2 07/25/2007 0900   NITRITE NEGATIVE 02/15/2022 1750   LEUKOCYTESUR TRACE (A) 02/15/2022 1750    Sepsis Labs: Lactic Acid, Venous    Component Value Date/Time   LATICACIDVEN 0.9 06/09/2021 0443    MICROBIOLOGY: No results found for this or any previous visit (from the past 240 hours).  RADIOLOGY STUDIES/RESULTS: No results found.   LOS: 9 days   Kimberly Penna, MD  [facility]    To contact the attending provider between 7A-7P or the covering provider during after hours 7P-7A, please log into the web site [email or web address] and access using universal [place] password for that web site. If you do not have the password, please call the hospital operator.  08/02/2023, 11:29 AM

## 2023-08-03 DIAGNOSIS — F10930 Alcohol use, unspecified with withdrawal, uncomplicated: Secondary | ICD-10-CM | POA: Diagnosis not present

## 2023-08-03 DIAGNOSIS — S2232XA Fracture of one rib, left side, initial encounter for closed fracture: Secondary | ICD-10-CM | POA: Diagnosis not present

## 2023-08-03 DIAGNOSIS — R52 Pain, unspecified: Secondary | ICD-10-CM | POA: Diagnosis not present

## 2023-08-03 DIAGNOSIS — I10 Essential (primary) hypertension: Secondary | ICD-10-CM | POA: Diagnosis not present

## 2023-08-03 MED ORDER — GABAPENTIN 300 MG PO CAPS
300.0000 mg | ORAL_CAPSULE | Freq: Three times a day (TID) | ORAL | Status: DC
  Administered 2023-08-03 (×2): 300 mg via ORAL
  Filled 2023-08-03 (×2): qty 1

## 2023-08-03 MED ORDER — HYDROCODONE-ACETAMINOPHEN 5-325 MG PO TABS
1.0000 | ORAL_TABLET | Freq: Once | ORAL | Status: AC
  Administered 2023-08-03: 2 via ORAL
  Filled 2023-08-03 (×2): qty 2

## 2023-08-03 MED ORDER — HYDROCODONE-ACETAMINOPHEN 5-325 MG PO TABS
1.0000 | ORAL_TABLET | Freq: Four times a day (QID) | ORAL | Status: DC | PRN
  Administered 2023-08-03 – 2023-08-06 (×8): 2 via ORAL
  Administered 2023-08-07 (×2): 1 via ORAL
  Administered 2023-08-07: 2 via ORAL
  Filled 2023-08-03 (×3): qty 2
  Filled 2023-08-03 (×2): qty 1
  Filled 2023-08-03 (×6): qty 2

## 2023-08-03 MED ORDER — CHLORDIAZEPOXIDE HCL 5 MG PO CAPS
5.0000 mg | ORAL_CAPSULE | Freq: Two times a day (BID) | ORAL | Status: AC
  Administered 2023-08-03 (×2): 5 mg via ORAL
  Filled 2023-08-03 (×2): qty 1

## 2023-08-03 NOTE — Plan of Care (Signed)
  Problem: Education: Goal: Knowledge of General Education information will improve Description: Including pain rating scale, medication(s)/side effects and non-pharmacologic comfort measures Outcome: Progressing   Problem: Clinical Measurements: Goal: Ability to maintain clinical measurements within normal limits will improve Outcome: Progressing Goal: Will remain free from infection Outcome: Progressing   Problem: Activity: Goal: Risk for activity intolerance will decrease Outcome: Progressing   Problem: Nutrition: Goal: Adequate nutrition will be maintained Outcome: Progressing   Problem: Coping: Goal: Level of anxiety will decrease Outcome: Progressing   Problem: Pain Managment: Goal: General experience of comfort will improve and/or be controlled Outcome: Progressing   Problem: Safety: Goal: Ability to remain free from injury will improve Outcome: Progressing

## 2023-08-03 NOTE — Progress Notes (Addendum)
 PROGRESS NOTE        PATIENT DETAILS Name: Terri Ayala Age: 68 y.o. Sex: female Date of Birth: 1955/08/20 Admit Date: 07/24/2023 Admitting Physician Selene Dais, MD ZOX:[id number], [place], [place]  Brief Summary: Patient is a 68 y.o.  female with history of EtOH use-presented with fall resulting in rib fracture-Hospital course complicated by alcohol  withdrawal symptoms.  Significant events: 5/26>> admit to [facility].  Significant studies: 5/26>> x-ray pelvis: Right hip degenerative changes. 5/26>> CT head: No acute abnormalities 5/26>> CT C-spine: No fracture/subluxation 5/26>> CT T-spine: Acute displaced left eighth rib fracture. 5/26>> CT chest/abdomen/pelvis: Acute posterior left eighth rib fracture.  Remote healed fractures of the right 5th through 10th posterior lateral ribs.  No acute trauma.  Significant microbiology data: None  Procedures: None  Consults: None  Subjective: Complains of some left flank pain.  Complains that she is weak and is unable to ambulate independently.  Objective: Vitals: Blood pressure 119/76, pulse (!) 102, temperature 97.8 F (36.6 C), temperature source Oral, resp. rate 20, height 5\' 2"  (1.575 m), weight 54.4 kg, SpO2 96%.   Exam: Awake/alert Chest: Clear to auscultation CVS: S1-S2 regular Abdomen: Soft nontender nondistended Moving all 4 extremities-but does have generalized weakness.  Pertinent Labs/Radiology:    Latest Ref Rng & Units 08/02/2023    4:37 AM 08/01/2023    4:30 AM 07/30/2023    2:27 AM  CBC  WBC 4.0 - 10.5 K/uL 7.4  7.7  8.2   Hemoglobin 12.0 - 15.0 g/dL 09.8  11.9  14.7   Hematocrit 36.0 - 46.0 % 36.2  35.2  30.9   Platelets 150 - 400 K/uL 328  342  255     Lab Results  Component Value Date   NA 138 08/02/2023   K 4.0 08/02/2023   CL 106 08/02/2023   CO2 23 08/02/2023     Assessment/Plan: EtOH withdrawal Acute alcohol  encephalopathy Improved-completely awake and alert-no  tremors. Should be out of the window for any further withdrawal symptoms at this point. Remains on MVI/thiamine /folic acid   Acute posterior left eighth rib fracture Secondary to EtOH intoxicant related fall Supportive care-as needed narcotics as she continues to have some intermittent left flank pain. Change Neurontin  to 3 times daily dosing  HTN BP stable Coreg   GERD PPI  Debility/deconditioning PT/OT eval-SNF recommended  Nutrition Status: Nutrition Problem: Moderate Malnutrition Etiology: social / environmental circumstances (EtOH use) Signs/Symptoms: moderate muscle depletion, moderate fat depletion Interventions: Ensure Enlive (each supplement provides 350kcal and 20 grams of protein), Magic cup, MVI, Refer to RD note for recommendations  Code status:   Code Status: Full Code   DVT Prophylaxis: enoxaparin  (LOVENOX ) injection 30 mg Start: 08/02/23 1230 SCDs Start: 07/24/23 1907   Family Communication: None at bedside   Disposition Plan: Status is: Inpatient Remains inpatient appropriate because: Severity of illness   Planned Discharge Destination:Skilled nursing facility   Diet: Diet Order             Diet Heart Room service appropriate? No; Fluid consistency: Thin  Diet effective now                     Antimicrobial agents: Anti-infectives (From admission, onward)    None        MEDICATIONS: Scheduled Meds:  baclofen   10 mg Oral TID   buPROPion   150 mg  Oral BH-q7a   busPIRone   15 mg Oral TID   carvedilol   6.25 mg Oral BID WC   chlordiazePOXIDE   5 mg Oral BID   DULoxetine   60 mg Oral BID   enoxaparin  (LOVENOX ) injection  30 mg Subcutaneous Q24H   feeding supplement  237 mL Oral TID BM   folic acid   1 mg Oral Daily   gabapentin   300 mg Oral BID   multivitamin with minerals  1 tablet Oral Daily   pantoprazole   40 mg Oral BID   QUEtiapine  25 mg Oral QHS   thiamine   100 mg Oral Daily   Continuous Infusions: PRN Meds:.acetaminophen   **OR** acetaminophen , HYDROcodone -acetaminophen , prochlorperazine    I have personally reviewed following labs and imaging studies  LABORATORY DATA: CBC: Recent Labs  Lab 07/28/23 0527 07/29/23 0228 07/30/23 0227 08/01/23 0430 08/02/23 0437  WBC 8.9 9.4 8.2 7.7 7.4  HGB 11.5* 11.4* 10.4* 11.6* 11.9*  HCT 34.1* 33.8* 30.9* 35.2* 36.2  MCV 91.4 92.3 91.2 93.4 93.1  PLT 234 237 255 342 328    Basic Metabolic Panel: Recent Labs  Lab 07/28/23 0527 07/29/23 0228 07/30/23 0227 08/01/23 0430 08/02/23 0437  NA 134* 135 136 139 138  K 3.6 3.7 3.2* 4.8 4.0  CL 105 105 104 103 106  CO2 18* 21* 22 24 23   GLUCOSE 89 120* 114* 101* 104*  BUN 13 14 10 8 11   CREATININE 0.65 0.62 0.66 0.78 0.72  CALCIUM  8.5* 9.0 8.7* 9.0 9.2  MG 1.8 1.5* 1.5* 1.9 1.9  PHOS 3.2 3.1 3.9 5.4* 5.9*    GFR: Estimated Creatinine Clearance: 53.2 mL/min (by C-G formula based on SCr of 0.72 mg/dL).  Liver Function Tests: No results for input(s): "AST", "ALT", "ALKPHOS", "BILITOT", "PROT", "ALBUMIN" in the last 168 hours. No results for input(s): "LIPASE", "AMYLASE" in the last 168 hours. No results for input(s): "AMMONIA" in the last 168 hours.  Coagulation Profile: No results for input(s): "INR", "PROTIME" in the last 168 hours.  Cardiac Enzymes: No results for input(s): "CKTOTAL", "CKMB", "CKMBINDEX", "TROPONINI" in the last 168 hours.  BNP (last 3 results) No results for input(s): "PROBNP" in the last 8760 hours.  Lipid Profile: No results for input(s): "CHOL", "HDL", "LDLCALC", "TRIG", "CHOLHDL", "LDLDIRECT" in the last 72 hours.  Thyroid  Function Tests: No results for input(s): "TSH", "T4TOTAL", "FREET4", "T3FREE", "THYROIDAB" in the last 72 hours.  Anemia Panel: No results for input(s): "VITAMINB12", "FOLATE", "FERRITIN", "TIBC", "IRON", "RETICCTPCT" in the last 72 hours.  Urine analysis:    Component Value Date/Time   COLORURINE STRAW (A) 02/15/2022 1750   APPEARANCEUR CLEAR  02/15/2022 1750   LABSPEC 1.006 02/15/2022 1750   LABSPEC 1.030 09/01/2017 1443   PHURINE 7.0 02/15/2022 1750   GLUCOSEU NEGATIVE 02/15/2022 1750   HGBUR NEGATIVE 02/15/2022 1750   BILIRUBINUR NEGATIVE 02/15/2022 1750   BILIRUBINUR neg 08/28/2019 1612   KETONESUR 20 (A) 02/15/2022 1750   PROTEINUR NEGATIVE 02/15/2022 1750   UROBILINOGEN 0.2 08/28/2019 1612   UROBILINOGEN 0.2 07/25/2007 0900   NITRITE NEGATIVE 02/15/2022 1750   LEUKOCYTESUR TRACE (A) 02/15/2022 1750    Sepsis Labs: Lactic Acid, Venous    Component Value Date/Time   LATICACIDVEN 0.9 06/09/2021 0443    MICROBIOLOGY: No results found for this or any previous visit (from the past 240 hours).  RADIOLOGY STUDIES/RESULTS: No results found.   LOS: 10 days   Kimberly Penna, MD  [facility]    To contact the attending provider between 7A-7P or the  covering provider during after hours 7P-7A, please log into the web site [email or web address] and access using universal [place] password for that web site. If you do not have the password, please call the hospital operator.  08/03/2023, 9:35 AM

## 2023-08-03 NOTE — Plan of Care (Signed)

## 2023-08-03 NOTE — TOC Progression Note (Signed)
 Transition of Care [facility]) - Progression Note    Patient Details  Name: Terri Ayala MRN: [id number] Date of Birth: [birth year]  [facility] CM/SW Contact  Jannice Mends, LCSW Phone Number: 08/03/2023, 10:30 AM  Clinical Narrative:    Pasrr is under level II in person review.      Barriers to Discharge: Continued Medical Work up  Expected Discharge Plan and Services In-house Referral: Clinical Social Work                                             Social Determinants of Health (SDOH) Interventions SDOH Screenings   Food Insecurity: Food Insecurity Present (07/27/2023)  Housing: Low Risk  (07/27/2023)  Transportation Needs: No Transportation Needs (07/27/2023)  Utilities: Not At Risk (07/27/2023)  Alcohol  Screen: Low Risk  (01/22/2023)  Depression (PHQ2-9): Low Risk  (05/01/2023)  Financial Resource Strain: High Risk (04/30/2023)  Physical Activity: Insufficiently Active (04/30/2023)  Social Connections: Moderately Isolated (07/27/2023)  Stress: No Stress Concern Present (04/30/2023)  Tobacco Use: Medium Risk (07/24/2023)  Health Literacy: Adequate Health Literacy (01/23/2023)    Readmission Risk Interventions     No data to display

## 2023-08-04 DIAGNOSIS — S2232XA Fracture of one rib, left side, initial encounter for closed fracture: Secondary | ICD-10-CM | POA: Diagnosis not present

## 2023-08-04 DIAGNOSIS — R52 Pain, unspecified: Secondary | ICD-10-CM | POA: Diagnosis not present

## 2023-08-04 DIAGNOSIS — I10 Essential (primary) hypertension: Secondary | ICD-10-CM | POA: Diagnosis not present

## 2023-08-04 DIAGNOSIS — F10930 Alcohol use, unspecified with withdrawal, uncomplicated: Secondary | ICD-10-CM | POA: Diagnosis not present

## 2023-08-04 MED ORDER — GABAPENTIN 400 MG PO CAPS
400.0000 mg | ORAL_CAPSULE | Freq: Three times a day (TID) | ORAL | Status: DC
  Administered 2023-08-04 – 2023-08-06 (×7): 400 mg via ORAL
  Filled 2023-08-04 (×7): qty 1

## 2023-08-04 NOTE — Progress Notes (Signed)
 Physical Therapy Treatment Patient Details Name: Terri Ayala MRN: [id number] DOB: 28-Jul-1955 Today's Date: 08/04/2023   History of Present Illness 68 y.o. female adm 5/26 with medical history significant of depression, anxiety, PTSD, heavy alcohol  use, hypertension neuropathy migraines.  Presented after a fall at home down 5 steps.  Imaging reveals Acute displaced fracture of the posterior left eighth rib.    PT Comments  Progressing towards acute functional goals. Required min assist for bed mobility and up to mod assist for transfers and gait. Demonstrates a heavy retropulsion in standing. Worked on NMRE with standing balance activities and progressed with gait training for short distance in room with RW for support. Patient will continue to benefit from skilled physical therapy services to further improve independence with functional mobility.     If plan is discharge home, recommend the following: Two people to help with walking and/or transfers;Two people to help with bathing/dressing/bathroom;Assistance with cooking/housework;Assistance with feeding;Direct supervision/assist for medications management;Assist for transportation;Help with stairs or ramp for entrance;Supervision due to cognitive status   Can travel by private vehicle     No (likely soon)  Equipment Recommendations  None recommended by PT    Recommendations for Other Services       Precautions / Restrictions Precautions Precautions: Fall Recall of Precautions/Restrictions: Intact Precaution/Restrictions Comments: ETOH Withdrawals Restrictions Weight Bearing Restrictions Per Provider Order: No     Mobility  Bed Mobility Overal bed mobility: Needs Assistance Bed Mobility: Rolling, Sidelying to Sit Rolling: Min assist Sidelying to sit: Min assist       General bed mobility comments: Min assist to roll and rise, with additional assist to scoot to EOB, slow and effortful, cues for technique.     Transfers Overall transfer level: Needs assistance Equipment used: Rolling walker (2 wheels) Transfers: Sit to/from Stand Sit to Stand: Mod assist           General transfer comment: Mod assist for boost and balance several times from edge of bed with significant retropulsion present. Max cues and techniques to facilitate forward weight shift. Gradually improved.    Ambulation/Gait Ambulation/Gait assistance: Mod assist Gait Distance (Feet): 10 Feet Assistive device: Rolling walker (2 wheels) Gait Pattern/deviations: Decreased stride length, Narrow base of support, Leaning posteriorly, Step-through pattern, Shuffle, Trunk flexed Gait velocity: dec Gait velocity interpretation: <1.31 ft/sec, indicative of household ambulator   General Gait Details: Up to mod assist for balance and RW control. Intermittent retropulsion, compensates with forward flexion. Shuffled steps. Cues for safety, technique, and awareness.   Stairs             Wheelchair Mobility     Tilt Bed    Modified Rankin (Stroke Patients Only)       Balance Overall balance assessment: Needs assistance Sitting-balance support: Feet supported, No upper extremity supported Sitting balance-Leahy Scale: Fair     Standing balance support: Bilateral upper extremity supported, During functional activity Standing balance-Leahy Scale: Poor Standing balance comment: BIL UE support and up to mod assist due to posterior lean                            Communication Communication Communication: No apparent difficulties  Cognition Arousal: Alert Behavior During Therapy: Impulsive   PT - Cognitive impairments: Memory, Sequencing, Awareness, Problem solving, Safety/Judgement                       PT - Cognition Comments: patient is  alert today throughout session. Following commands: Impaired Following commands impaired: Follows one step commands with increased time    Cueing Cueing  Techniques: Verbal cues, Gestural cues, Tactile cues  Exercises Other Exercises Other Exercises: Standing balance, weight shifting, forward reach for targets to increase limits of stability and awareness. Standing heel and toe raises with BIL UE support and mod assist for tasks.    General Comments        Pertinent Vitals/Pain Pain Assessment Pain Assessment: Faces Faces Pain Scale: Hurts even more Pain Location: L flank with movement. back Pain Descriptors / Indicators: Discomfort, Grimacing, Guarding Pain Intervention(s): Limited activity within patient's tolerance, Repositioned, Premedicated before session    Home Living                          Prior Function            PT Goals (current goals can now be found in the care plan section) Acute Rehab PT Goals Patient Stated Goal: to go home PT Goal Formulation: With patient Time For Goal Achievement: 08/09/23 Potential to Achieve Goals: Fair Progress towards PT goals: Progressing toward goals    Frequency    Min 2X/week      PT Plan      Co-evaluation              AM-PAC PT "6 Clicks" Mobility   Outcome Measure  Help needed turning from your back to your side while in a flat bed without using bedrails?: A Little Help needed moving from lying on your back to sitting on the side of a flat bed without using bedrails?: A Little Help needed moving to and from a bed to a chair (including a wheelchair)?: A Lot Help needed standing up from a chair using your arms (e.g., wheelchair or bedside chair)?: A Lot Help needed to walk in hospital room?: A Lot Help needed climbing 3-5 steps with a railing? : Total 6 Click Score: 13    End of Session Equipment Utilized During Treatment: Gait belt Activity Tolerance: Patient tolerated treatment well Patient left: in chair;with call bell/phone within reach;with chair alarm set Nurse Communication: Mobility status (+2 stand pivot transfer) PT Visit Diagnosis:  Other abnormalities of gait and mobility (R26.89);Muscle weakness (generalized) (M62.81);Difficulty in walking, not elsewhere classified (R26.2);Unsteadiness on feet (R26.81);Pain;History of falling (Z91.81) Pain - Right/Left: Left Pain - part of body:  (side/ribs)     Time: 1610-9604 PT Time Calculation (min) (ACUTE ONLY): 22 min  Charges:    $Therapeutic Activity: 8-22 mins PT General Charges $$ ACUTE PT VISIT: 1 Visit                     Jory Ng, PT, DPT [facility]  Rehabilitation Services Physical Therapist Office: [phone] Website: [email or web address]    Alinda Irani 08/04/2023, 12:42 PM

## 2023-08-04 NOTE — Plan of Care (Signed)

## 2023-08-04 NOTE — TOC Progression Note (Addendum)
 Transition of Care [facility]) - Progression Note    Patient Details  Name: Terri Ayala MRN: [id number] Date of Birth: [birth year]  [facility]) CM/SW Contact  Jannice Mends, LCSW Phone Number: 08/04/2023, 8:49 AM  Clinical Narrative:    SNF referrals sent out. Pasrr remains under in person review.   CSW met with patient who is pleasant and in agreement with SNF for rehab. CSW discussed bed offers with her and she has selected [name] so her friends can visit. She mentioned she would not go back to [facility]. CSW updated [facility] and their liaison will visit with patient today.      Barriers to Discharge: Continued Medical Work up  Expected Discharge Plan and Services In-house Referral: Clinical Social Work                                             Social Determinants of Health (SDOH) Interventions SDOH Screenings   Food Insecurity: Food Insecurity Present (07/27/2023)  Housing: Low Risk  (07/27/2023)  Transportation Needs: No Transportation Needs (07/27/2023)  Utilities: Not At Risk (07/27/2023)  Alcohol  Screen: Low Risk  (01/22/2023)  Depression (PHQ2-9): Low Risk  (05/01/2023)  Financial Resource Strain: High Risk (04/30/2023)  Physical Activity: Insufficiently Active (04/30/2023)  Social Connections: Moderately Isolated (07/27/2023)  Stress: No Stress Concern Present (04/30/2023)  Tobacco Use: Medium Risk (07/24/2023)  Health Literacy: Adequate Health Literacy (01/23/2023)    Readmission Risk Interventions     No data to display

## 2023-08-04 NOTE — Progress Notes (Signed)
 PROGRESS NOTE        PATIENT DETAILS Name: Terri Ayala Age: 68 y.o. Sex: female Date of Birth: February 08, 1956 Admit Date: 07/24/2023 Admitting Physician Selene Dais, MD WUJ:[id number], [place], [place]  Brief Summary: Patient is a 68 y.o.  female with history of EtOH use-presented with fall resulting in rib fracture-Hospital course complicated by alcohol  withdrawal symptoms.  Significant events: 5/26>> admit to [facility].  Significant studies: 5/26>> x-ray pelvis: Right hip degenerative changes. 5/26>> CT head: No acute abnormalities 5/26>> CT C-spine: No fracture/subluxation 5/26>> CT T-spine: Acute displaced left eighth rib fracture. 5/26>> CT chest/abdomen/pelvis: Acute posterior left eighth rib fracture.  Remote healed fractures of the right 5th through 10th posterior lateral ribs.  No acute trauma.  Significant microbiology data: None  Procedures: None  Consults: None  Subjective: Apart from intermittent left flank pain-no other issues.  Objective: Vitals: Blood pressure 96/72, pulse 78, temperature 98.3 F (36.8 C), temperature source Oral, resp. rate 19, height 5\' 2"  (1.575 m), weight 54.4 kg, SpO2 98%.   Exam: Awake/alert Chest: Clear to auscultation CVS: S1-S2 regular Abdomen: Soft nontender nondistended Extremities: No edema Nonfocal exam.  Pertinent Labs/Radiology:    Latest Ref Rng & Units 08/02/2023    4:37 AM 08/01/2023    4:30 AM 07/30/2023    2:27 AM  CBC  WBC 4.0 - 10.5 K/uL 7.4  7.7  8.2   Hemoglobin 12.0 - 15.0 g/dL 82.9  56.2  13.0   Hematocrit 36.0 - 46.0 % 36.2  35.2  30.9   Platelets 150 - 400 K/uL 328  342  255     Lab Results  Component Value Date   NA 138 08/02/2023   K 4.0 08/02/2023   CL 106 08/02/2023   CO2 23 08/02/2023     Assessment/Plan: EtOH withdrawal Acute alcohol  encephalopathy Improved-completely awake and alert-no tremors. Should be out of the window for any further withdrawal symptoms at this  point. Remains on MVI/thiamine /folic acid   Acute posterior left eighth rib fracture Secondary to EtOH intoxicant related fall Continues to have intermittent pain-continue as needed narcotics Change Neurontin  to 400 mg 3 times daily  HTN BP stable Coreg   GERD PPI  Debility/deconditioning PT/OT eval-SNF recommended-awaiting SNF placement at this point.  Nutrition Status: Nutrition Problem: Moderate Malnutrition Etiology: social / environmental circumstances (EtOH use) Signs/Symptoms: moderate muscle depletion, moderate fat depletion Interventions: Ensure Enlive (each supplement provides 350kcal and 20 grams of protein), Magic cup, MVI, Refer to RD note for recommendations  Code status:   Code Status: Full Code   DVT Prophylaxis: enoxaparin  (LOVENOX ) injection 30 mg Start: 08/02/23 1230 SCDs Start: 07/24/23 1907   Family Communication: None at bedside   Disposition Plan: Status is: Inpatient Remains inpatient appropriate because: Severity of illness   Planned Discharge Destination:Skilled nursing facility   Diet: Diet Order             Diet Heart Room service appropriate? No; Fluid consistency: Thin  Diet effective now                     Antimicrobial agents: Anti-infectives (From admission, onward)    None        MEDICATIONS: Scheduled Meds:  baclofen   10 mg Oral TID   buPROPion   150 mg Oral BH-q7a   busPIRone   15 mg Oral TID   carvedilol   6.25 mg Oral BID WC   DULoxetine   60 mg Oral BID   enoxaparin  (LOVENOX ) injection  30 mg Subcutaneous Q24H   feeding supplement  237 mL Oral TID BM   folic acid   1 mg Oral Daily   gabapentin   400 mg Oral TID   multivitamin with minerals  1 tablet Oral Daily   pantoprazole   40 mg Oral BID   QUEtiapine  25 mg Oral QHS   thiamine   100 mg Oral Daily   Continuous Infusions: PRN Meds:.acetaminophen  **OR** acetaminophen , HYDROcodone -acetaminophen , prochlorperazine    I have personally reviewed following  labs and imaging studies  LABORATORY DATA: CBC: Recent Labs  Lab 07/29/23 0228 07/30/23 0227 08/01/23 0430 08/02/23 0437  WBC 9.4 8.2 7.7 7.4  HGB 11.4* 10.4* 11.6* 11.9*  HCT 33.8* 30.9* 35.2* 36.2  MCV 92.3 91.2 93.4 93.1  PLT 237 255 342 328    Basic Metabolic Panel: Recent Labs  Lab 07/29/23 0228 07/30/23 0227 08/01/23 0430 08/02/23 0437  NA 135 136 139 138  K 3.7 3.2* 4.8 4.0  CL 105 104 103 106  CO2 21* 22 24 23   GLUCOSE 120* 114* 101* 104*  BUN 14 10 8 11   CREATININE 0.62 0.66 0.78 0.72  CALCIUM  9.0 8.7* 9.0 9.2  MG 1.5* 1.5* 1.9 1.9  PHOS 3.1 3.9 5.4* 5.9*    GFR: Estimated Creatinine Clearance: 53.2 mL/min (by C-G formula based on SCr of 0.72 mg/dL).  Liver Function Tests: No results for input(s): "AST", "ALT", "ALKPHOS", "BILITOT", "PROT", "ALBUMIN" in the last 168 hours. No results for input(s): "LIPASE", "AMYLASE" in the last 168 hours. No results for input(s): "AMMONIA" in the last 168 hours.  Coagulation Profile: No results for input(s): "INR", "PROTIME" in the last 168 hours.  Cardiac Enzymes: No results for input(s): "CKTOTAL", "CKMB", "CKMBINDEX", "TROPONINI" in the last 168 hours.  BNP (last 3 results) No results for input(s): "PROBNP" in the last 8760 hours.  Lipid Profile: No results for input(s): "CHOL", "HDL", "LDLCALC", "TRIG", "CHOLHDL", "LDLDIRECT" in the last 72 hours.  Thyroid  Function Tests: No results for input(s): "TSH", "T4TOTAL", "FREET4", "T3FREE", "THYROIDAB" in the last 72 hours.  Anemia Panel: No results for input(s): "VITAMINB12", "FOLATE", "FERRITIN", "TIBC", "IRON", "RETICCTPCT" in the last 72 hours.  Urine analysis:    Component Value Date/Time   COLORURINE STRAW (A) 02/15/2022 1750   APPEARANCEUR CLEAR 02/15/2022 1750   LABSPEC 1.006 02/15/2022 1750   LABSPEC 1.030 09/01/2017 1443   PHURINE 7.0 02/15/2022 1750   GLUCOSEU NEGATIVE 02/15/2022 1750   HGBUR NEGATIVE 02/15/2022 1750   BILIRUBINUR NEGATIVE  02/15/2022 1750   BILIRUBINUR neg 08/28/2019 1612   KETONESUR 20 (A) 02/15/2022 1750   PROTEINUR NEGATIVE 02/15/2022 1750   UROBILINOGEN 0.2 08/28/2019 1612   UROBILINOGEN 0.2 07/25/2007 0900   NITRITE NEGATIVE 02/15/2022 1750   LEUKOCYTESUR TRACE (A) 02/15/2022 1750    Sepsis Labs: Lactic Acid, Venous    Component Value Date/Time   LATICACIDVEN 0.9 06/09/2021 0443    MICROBIOLOGY: No results found for this or any previous visit (from the past 240 hours).  RADIOLOGY STUDIES/RESULTS: No results found.   LOS: 11 days   Kimberly Penna, MD  [facility]    To contact the attending provider between 7A-7P or the covering provider during after hours 7P-7A, please log into the web site [email or web address] and access using universal [place] password for that web site. If you do not have the password, please call the hospital operator.  08/04/2023, 10:01 AM

## 2023-08-04 NOTE — Progress Notes (Signed)
 OT Cancellation Note  Patient Details Name: Terri Ayala MRN: [id number] DOB: 1955/09/25   Cancelled Treatment:    Reason Eval/Treat Not Completed: Other (comment) Pt eating lunch on OT attempt. Will follow up as schedule permits.  Shireen Dory 08/04/2023, 1:35 PM

## 2023-08-05 DIAGNOSIS — I10 Essential (primary) hypertension: Secondary | ICD-10-CM | POA: Diagnosis not present

## 2023-08-05 DIAGNOSIS — S2232XA Fracture of one rib, left side, initial encounter for closed fracture: Secondary | ICD-10-CM | POA: Diagnosis not present

## 2023-08-05 DIAGNOSIS — F10930 Alcohol use, unspecified with withdrawal, uncomplicated: Secondary | ICD-10-CM | POA: Diagnosis not present

## 2023-08-05 DIAGNOSIS — R52 Pain, unspecified: Secondary | ICD-10-CM | POA: Diagnosis not present

## 2023-08-05 MED ORDER — BISACODYL 10 MG RE SUPP: 10.0000 mg | Freq: Every day | RECTAL | Status: DC | PRN

## 2023-08-05 MED ORDER — POLYETHYLENE GLYCOL 3350 17 G PO PACK
17.0000 g | PACK | Freq: Every day | ORAL | Status: DC
  Administered 2023-08-05 – 2023-08-06 (×2): 17 g via ORAL
  Filled 2023-08-05 (×3): qty 1

## 2023-08-05 MED ORDER — SENNOSIDES-DOCUSATE SODIUM 8.6-50 MG PO TABS
2.0000 | ORAL_TABLET | Freq: Every day | ORAL | Status: DC
  Administered 2023-08-05 – 2023-08-06 (×2): 2 via ORAL
  Filled 2023-08-05 (×2): qty 2

## 2023-08-05 NOTE — Progress Notes (Signed)
 PROGRESS NOTE        PATIENT DETAILS Name: Terri Ayala Age: 68 y.o. Sex: female Date of Birth: 31-Dec-1955 Admit Date: 07/24/2023 Admitting Physician Selene Dais, MD [id number], [place], [place]  Brief Summary: Patient is a 68 y.o.  female with history of EtOH use-presented with fall resulting in rib fracture-Hospital course complicated by alcohol  withdrawal symptoms.  Significant events: 5/26>> admit to [facility].  Significant studies: 5/26>> x-ray pelvis: Right hip degenerative changes. 5/26>> CT head: No acute abnormalities 5/26>> CT C-spine: No fracture/subluxation 5/26>> CT T-spine: Acute displaced left eighth rib fracture. 5/26>> CT chest/abdomen/pelvis: Acute posterior left eighth rib fracture.  Remote healed fractures of the right 5th through 10th posterior lateral ribs.  No acute trauma.  Significant microbiology data: None  Procedures: None  Consults: None  Subjective: No major issues overnight-some left flank pain continues but she appears comfortable.  Objective: Vitals: Blood pressure 115/79, pulse 85, temperature 98.3 F (36.8 C), temperature source Tympanic, resp. rate 17, height 5\' 2"  (1.575 m), weight 54.4 kg, SpO2 96%.   Exam: Awake/alert CVS: S1-S2 regular Chest: Clear to auscultation Nonfocal exam No leg edema.  Pertinent Labs/Radiology:    Latest Ref Rng & Units 08/02/2023    4:37 AM 08/01/2023    4:30 AM 07/30/2023    2:27 AM  CBC  WBC 4.0 - 10.5 K/uL 7.4  7.7  8.2   Hemoglobin 12.0 - 15.0 g/dL 87.5  64.3  32.9   Hematocrit 36.0 - 46.0 % 36.2  35.2  30.9   Platelets 150 - 400 K/uL 328  342  255     Lab Results  Component Value Date   NA 138 08/02/2023   K 4.0 08/02/2023   CL 106 08/02/2023   CO2 23 08/02/2023     Assessment/Plan: EtOH withdrawal Acute alcohol  encephalopathy Improved-completely awake and alert-no tremors. Should be out of the window for any further withdrawal symptoms at this point. Remains  on MVI/thiamine /folic acid   Acute posterior left eighth rib fracture Secondary to EtOH intoxicant related fall Continues to have intermittent pain-continue as needed narcotics Continue Neurontin -will slowly escalate dose over the next several days.  HTN BP stable Coreg   GERD PPI  Debility/deconditioning PT/OT eval-SNF recommended-awaiting SNF placement at this point.  Nutrition Status: Nutrition Problem: Moderate Malnutrition Etiology: social / environmental circumstances (EtOH use) Signs/Symptoms: moderate muscle depletion, moderate fat depletion Interventions: Ensure Enlive (each supplement provides 350kcal and 20 grams of protein), Magic cup, MVI, Refer to RD note for recommendations  Code status:   Code Status: Full Code   DVT Prophylaxis: enoxaparin  (LOVENOX ) injection 30 mg Start: 08/02/23 1230 SCDs Start: 07/24/23 1907   Family Communication: None at bedside   Disposition Plan: Status is: Inpatient Remains inpatient appropriate because: Severity of illness   Planned Discharge Destination:Skilled nursing facility   Diet: Diet Order             Diet Heart Room service appropriate? No; Fluid consistency: Thin  Diet effective now                     Antimicrobial agents: Anti-infectives (From admission, onward)    None        MEDICATIONS: Scheduled Meds:  baclofen   10 mg Oral TID   buPROPion   150 mg Oral BH-q7a   busPIRone   15 mg Oral TID  carvedilol   6.25 mg Oral BID WC   DULoxetine   60 mg Oral BID   enoxaparin  (LOVENOX ) injection  30 mg Subcutaneous Q24H   feeding supplement  237 mL Oral TID BM   folic acid   1 mg Oral Daily   gabapentin   400 mg Oral TID   multivitamin with minerals  1 tablet Oral Daily   pantoprazole   40 mg Oral BID   polyethylene glycol  17 g Oral Daily   QUEtiapine   25 mg Oral QHS   senna-docusate  2 tablet Oral QHS   thiamine   100 mg Oral Daily   Continuous Infusions: PRN Meds:.acetaminophen  **OR**  acetaminophen , bisacodyl , HYDROcodone -acetaminophen , prochlorperazine    I have personally reviewed following labs and imaging studies  LABORATORY DATA: CBC: Recent Labs  Lab 07/30/23 0227 08/01/23 0430 08/02/23 0437  WBC 8.2 7.7 7.4  HGB 10.4* 11.6* 11.9*  HCT 30.9* 35.2* 36.2  MCV 91.2 93.4 93.1  PLT 255 342 328    Basic Metabolic Panel: Recent Labs  Lab 07/30/23 0227 08/01/23 0430 08/02/23 0437  NA 136 139 138  K 3.2* 4.8 4.0  CL 104 103 106  CO2 22 24 23   GLUCOSE 114* 101* 104*  BUN 10 8 11   CREATININE 0.66 0.78 0.72  CALCIUM  8.7* 9.0 9.2  MG 1.5* 1.9 1.9  PHOS 3.9 5.4* 5.9*    GFR: Estimated Creatinine Clearance: 53.2 mL/min (by C-G formula based on SCr of 0.72 mg/dL).  Liver Function Tests: No results for input(s): "AST", "ALT", "ALKPHOS", "BILITOT", "PROT", "ALBUMIN" in the last 168 hours. No results for input(s): "LIPASE", "AMYLASE" in the last 168 hours. No results for input(s): "AMMONIA" in the last 168 hours.  Coagulation Profile: No results for input(s): "INR", "PROTIME" in the last 168 hours.  Cardiac Enzymes: No results for input(s): "CKTOTAL", "CKMB", "CKMBINDEX", "TROPONINI" in the last 168 hours.  BNP (last 3 results) No results for input(s): "PROBNP" in the last 8760 hours.  Lipid Profile: No results for input(s): "CHOL", "HDL", "LDLCALC", "TRIG", "CHOLHDL", "LDLDIRECT" in the last 72 hours.  Thyroid  Function Tests: No results for input(s): "TSH", "T4TOTAL", "FREET4", "T3FREE", "THYROIDAB" in the last 72 hours.  Anemia Panel: No results for input(s): "VITAMINB12", "FOLATE", "FERRITIN", "TIBC", "IRON", "RETICCTPCT" in the last 72 hours.  Urine analysis:    Component Value Date/Time   COLORURINE STRAW (A) 02/15/2022 1750   APPEARANCEUR CLEAR 02/15/2022 1750   LABSPEC 1.006 02/15/2022 1750   LABSPEC 1.030 09/01/2017 1443   PHURINE 7.0 02/15/2022 1750   GLUCOSEU NEGATIVE 02/15/2022 1750   HGBUR NEGATIVE 02/15/2022 1750    BILIRUBINUR NEGATIVE 02/15/2022 1750   BILIRUBINUR neg 08/28/2019 1612   KETONESUR 20 (A) 02/15/2022 1750   PROTEINUR NEGATIVE 02/15/2022 1750   UROBILINOGEN 0.2 08/28/2019 1612   UROBILINOGEN 0.2 07/25/2007 0900   NITRITE NEGATIVE 02/15/2022 1750   LEUKOCYTESUR TRACE (A) 02/15/2022 1750    Sepsis Labs: Lactic Acid, Venous    Component Value Date/Time   LATICACIDVEN 0.9 06/09/2021 0443    MICROBIOLOGY: No results found for this or any previous visit (from the past 240 hours).  RADIOLOGY STUDIES/RESULTS: No results found.   LOS: 12 days   Kimberly Penna, MD  [facility]    To contact the attending provider between 7A-7P or the covering provider during after hours 7P-7A, please log into the web site [email or web address] and access using universal [place] password for that web site. If you do not have the password, please call the hospital operator.  08/05/2023, 10:04 AM

## 2023-08-05 NOTE — Plan of Care (Signed)
  Problem: Health Behavior/Discharge Planning: Goal: Ability to manage health-related needs will improve Outcome: Progressing   Problem: Activity: Goal: Risk for activity intolerance will decrease Outcome: Progressing   Problem: Nutrition: Goal: Adequate nutrition will be maintained Outcome: Progressing   Problem: Coping: Goal: Level of anxiety will decrease Outcome: Progressing   Problem: Pain Managment: Goal: General experience of comfort will improve and/or be controlled Outcome: Progressing   Problem: Safety: Goal: Ability to remain free from injury will improve Outcome: Progressing

## 2023-08-06 DIAGNOSIS — S2232XA Fracture of one rib, left side, initial encounter for closed fracture: Secondary | ICD-10-CM | POA: Diagnosis not present

## 2023-08-06 DIAGNOSIS — R52 Pain, unspecified: Secondary | ICD-10-CM | POA: Diagnosis not present

## 2023-08-06 DIAGNOSIS — F10930 Alcohol use, unspecified with withdrawal, uncomplicated: Secondary | ICD-10-CM | POA: Diagnosis not present

## 2023-08-06 DIAGNOSIS — I10 Essential (primary) hypertension: Secondary | ICD-10-CM | POA: Diagnosis not present

## 2023-08-06 MED ORDER — GABAPENTIN 300 MG PO CAPS
300.0000 mg | ORAL_CAPSULE | Freq: Two times a day (BID) | ORAL | Status: DC
  Administered 2023-08-07: 300 mg via ORAL
  Filled 2023-08-06: qty 1

## 2023-08-06 NOTE — Progress Notes (Signed)
 PROGRESS NOTE        PATIENT DETAILS Name: Terri Ayala Age: 68 y.o. Sex: female Date of Birth: 08-Feb-1956 Admit Date: 07/24/2023 Admitting Physician Selene Dais, MD [id number], [place], [place]  Brief Summary: Patient is a 68 y.o.  female with history of EtOH use-presented with fall resulting in rib fracture-Hospital course complicated by alcohol  withdrawal symptoms.  Significant events: 5/26>> admit to [facility].  Significant studies: 5/26>> x-ray pelvis: Right hip degenerative changes. 5/26>> CT head: No acute abnormalities 5/26>> CT C-spine: No fracture/subluxation 5/26>> CT T-spine: Acute displaced left eighth rib fracture. 5/26>> CT chest/abdomen/pelvis: Acute posterior left eighth rib fracture.  Remote healed fractures of the right 5th through 10th posterior lateral ribs.  No acute trauma.  Significant microbiology data: None  Procedures: None  Consults: None  Subjective: Pain is under adequate control per patient this morning-sleeping comfortably-answering all my questions appropriately when awake.  Objective: Vitals: Blood pressure 116/67, pulse 74, temperature 97.9 F (36.6 C), temperature source Oral, resp. rate 13, height 5\' 2"  (1.575 m), weight 54.4 kg, SpO2 93%.   Exam: Not in any distress Awake/alert Chest: Clear to auscultation Abdomen: Soft nontender nondistended Extremities: No edema Nonfocal exam.  Pertinent Labs/Radiology:    Latest Ref Rng & Units 08/02/2023    4:37 AM 08/01/2023    4:30 AM 07/30/2023    2:27 AM  CBC  WBC 4.0 - 10.5 K/uL 7.4  7.7  8.2   Hemoglobin 12.0 - 15.0 g/dL 09.8  11.9  14.7   Hematocrit 36.0 - 46.0 % 36.2  35.2  30.9   Platelets 150 - 400 K/uL 328  342  255     Lab Results  Component Value Date   NA 138 08/02/2023   K 4.0 08/02/2023   CL 106 08/02/2023   CO2 23 08/02/2023     Assessment/Plan: EtOH withdrawal Acute alcohol  encephalopathy Improved-completely awake and alert-no  tremors. Should be out of the window for any further withdrawal symptoms at this point. Remains on MVI/thiamine /folic acid   Acute posterior left eighth rib fracture Secondary to EtOH intoxicant related fall Continues to have intermittent pain-continue as needed narcotics Continue Neurontin -will slowly escalate dose over the next several days.  HTN BP stable Coreg   GERD PPI  Debility/deconditioning PT/OT eval-SNF recommended-awaiting SNF placement at this point.  Nutrition Status: Nutrition Problem: Moderate Malnutrition Etiology: social / environmental circumstances (EtOH use) Signs/Symptoms: moderate muscle depletion, moderate fat depletion Interventions: Ensure Enlive (each supplement provides 350kcal and 20 grams of protein), Magic cup, MVI, Refer to RD note for recommendations  Code status:   Code Status: Full Code   DVT Prophylaxis: enoxaparin  (LOVENOX ) injection 30 mg Start: 08/02/23 1230 SCDs Start: 07/24/23 1907   Family Communication: None at bedside   Disposition Plan: Status is: Inpatient Remains inpatient appropriate because: Severity of illness   Planned Discharge Destination:Skilled nursing facility   Diet: Diet Order             Diet Heart Room service appropriate? No; Fluid consistency: Thin  Diet effective now                     Antimicrobial agents: Anti-infectives (From admission, onward)    None        MEDICATIONS: Scheduled Meds:  baclofen   10 mg Oral TID   buPROPion   150 mg Oral BH-q7a  busPIRone   15 mg Oral TID   carvedilol   6.25 mg Oral BID WC   DULoxetine   60 mg Oral BID   enoxaparin  (LOVENOX ) injection  30 mg Subcutaneous Q24H   feeding supplement  237 mL Oral TID BM   folic acid   1 mg Oral Daily   gabapentin   400 mg Oral TID   multivitamin with minerals  1 tablet Oral Daily   pantoprazole   40 mg Oral BID   polyethylene glycol  17 g Oral Daily   QUEtiapine   25 mg Oral QHS   senna-docusate  2 tablet Oral QHS    thiamine   100 mg Oral Daily   Continuous Infusions: PRN Meds:.acetaminophen  **OR** acetaminophen , bisacodyl , HYDROcodone -acetaminophen , prochlorperazine    I have personally reviewed following labs and imaging studies  LABORATORY DATA: CBC: Recent Labs  Lab 08/01/23 0430 08/02/23 0437  WBC 7.7 7.4  HGB 11.6* 11.9*  HCT 35.2* 36.2  MCV 93.4 93.1  PLT 342 328    Basic Metabolic Panel: Recent Labs  Lab 08/01/23 0430 08/02/23 0437  NA 139 138  K 4.8 4.0  CL 103 106  CO2 24 23  GLUCOSE 101* 104*  BUN 8 11  CREATININE 0.78 0.72  CALCIUM  9.0 9.2  MG 1.9 1.9  PHOS 5.4* 5.9*    GFR: Estimated Creatinine Clearance: 53.2 mL/min (by C-G formula based on SCr of 0.72 mg/dL).  Liver Function Tests: No results for input(s): "AST", "ALT", "ALKPHOS", "BILITOT", "PROT", "ALBUMIN" in the last 168 hours. No results for input(s): "LIPASE", "AMYLASE" in the last 168 hours. No results for input(s): "AMMONIA" in the last 168 hours.  Coagulation Profile: No results for input(s): "INR", "PROTIME" in the last 168 hours.  Cardiac Enzymes: No results for input(s): "CKTOTAL", "CKMB", "CKMBINDEX", "TROPONINI" in the last 168 hours.  BNP (last 3 results) No results for input(s): "PROBNP" in the last 8760 hours.  Lipid Profile: No results for input(s): "CHOL", "HDL", "LDLCALC", "TRIG", "CHOLHDL", "LDLDIRECT" in the last 72 hours.  Thyroid  Function Tests: No results for input(s): "TSH", "T4TOTAL", "FREET4", "T3FREE", "THYROIDAB" in the last 72 hours.  Anemia Panel: No results for input(s): "VITAMINB12", "FOLATE", "FERRITIN", "TIBC", "IRON", "RETICCTPCT" in the last 72 hours.  Urine analysis:    Component Value Date/Time   COLORURINE STRAW (A) 02/15/2022 1750   APPEARANCEUR CLEAR 02/15/2022 1750   LABSPEC 1.006 02/15/2022 1750   LABSPEC 1.030 09/01/2017 1443   PHURINE 7.0 02/15/2022 1750   GLUCOSEU NEGATIVE 02/15/2022 1750   HGBUR NEGATIVE 02/15/2022 1750   BILIRUBINUR NEGATIVE  02/15/2022 1750   BILIRUBINUR neg 08/28/2019 1612   KETONESUR 20 (A) 02/15/2022 1750   PROTEINUR NEGATIVE 02/15/2022 1750   UROBILINOGEN 0.2 08/28/2019 1612   UROBILINOGEN 0.2 07/25/2007 0900   NITRITE NEGATIVE 02/15/2022 1750   LEUKOCYTESUR TRACE (A) 02/15/2022 1750    Sepsis Labs: Lactic Acid, Venous    Component Value Date/Time   LATICACIDVEN 0.9 06/09/2021 0443    MICROBIOLOGY: No results found for this or any previous visit (from the past 240 hours).  RADIOLOGY STUDIES/RESULTS: No results found.   LOS: 13 days   Kimberly Penna, MD  [facility]    To contact the attending provider between 7A-7P or the covering provider during after hours 7P-7A, please log into the web site [email or web address] and access using universal [place] password for that web site. If you do not have the password, please call the hospital operator.  08/06/2023, 8:59 AM

## 2023-08-06 NOTE — Plan of Care (Signed)

## 2023-08-06 NOTE — Plan of Care (Signed)

## 2023-08-07 DIAGNOSIS — F10930 Alcohol use, unspecified with withdrawal, uncomplicated: Secondary | ICD-10-CM | POA: Diagnosis not present

## 2023-08-07 DIAGNOSIS — I10 Essential (primary) hypertension: Secondary | ICD-10-CM | POA: Diagnosis not present

## 2023-08-07 DIAGNOSIS — S2232XA Fracture of one rib, left side, initial encounter for closed fracture: Secondary | ICD-10-CM | POA: Diagnosis not present

## 2023-08-07 DIAGNOSIS — R52 Pain, unspecified: Secondary | ICD-10-CM | POA: Diagnosis not present

## 2023-08-07 MED ORDER — MELATONIN 3 MG PO TABS
3.0000 mg | ORAL_TABLET | Freq: Once | ORAL | Status: AC
  Administered 2023-08-07: 3 mg via ORAL
  Filled 2023-08-07: qty 1

## 2023-08-07 MED ORDER — LIDOCAINE 5 % EX PTCH: 1.0000 | MEDICATED_PATCH | CUTANEOUS | Status: DC

## 2023-08-07 MED ORDER — BACLOFEN 10 MG PO TABS: 10.0000 mg | ORAL_TABLET | Freq: Three times a day (TID) | ORAL | 0 refills | Status: DC

## 2023-08-07 MED ORDER — LIDOCAINE 5 % EX PTCH
1.0000 | MEDICATED_PATCH | CUTANEOUS | Status: DC
  Administered 2023-08-07: 1 via TRANSDERMAL
  Filled 2023-08-07: qty 1

## 2023-08-07 MED ORDER — OXYCODONE HCL 5 MG PO TABS: 5.0000 mg | ORAL_TABLET | Freq: Four times a day (QID) | ORAL | 0 refills | Status: DC | PRN

## 2023-08-07 MED ORDER — ENSURE PLUS HIGH PROTEIN PO LIQD: 237.0000 mL | Freq: Three times a day (TID) | ORAL | Status: AC

## 2023-08-07 MED ORDER — QUETIAPINE FUMARATE 25 MG PO TABS: 25.0000 mg | ORAL_TABLET | Freq: Every day | ORAL | Status: DC

## 2023-08-07 MED ORDER — BUSPIRONE HCL 15 MG PO TABS: 15.0000 mg | ORAL_TABLET | Freq: Three times a day (TID) | ORAL | 0 refills | Status: DC

## 2023-08-07 NOTE — TOC Transition Note (Signed)
 Transition of Care [facility]) - Discharge Note   Patient Details  Name: Terri Ayala MRN: [id number] Date of Birth: 11-Nov-1955  [facility] of [facility]) CM/SW Contact:  Tandy Fam, LCSW Phone Number: 08/07/2023, 1:33 PM   Clinical Narrative:   Patient's PASRR was received, and bed is available at [facility] today. CSW updated MD, patient is medically stable to transition to SNF today. CSW sent discharge information to [facility], confirmed receipt and bed availability. Transport arranged with [facility] for next available.  Nurse to call report to [phone], Room 215.    Final next level of care: Skilled Nursing Facility Barriers to Discharge: Barriers Resolved   Patient Goals and CMS Choice            Discharge Placement              Patient chooses bed at: [facility] Patient to be transferred to facility by: [facility] Name of family member notified: Self Patient and family notified of of transfer: 08/07/23  Discharge Plan and Services Additional resources added to the After Visit Summary for   In-house Referral: Clinical Social Work                                   Social Drivers of Health (SDOH) Interventions SDOH Screenings   Food Insecurity: Food Insecurity Present (07/27/2023)  Housing: Low Risk  (07/27/2023)  Transportation Needs: No Transportation Needs (07/27/2023)  Utilities: Not At Risk (07/27/2023)  Alcohol  Screen: Low Risk  (01/22/2023)  Depression (PHQ2-9): Low Risk  (05/01/2023)  Financial Resource Strain: High Risk (04/30/2023)  Physical Activity: Insufficiently Active (04/30/2023)  Social Connections: Moderately Isolated (07/27/2023)  Stress: No Stress Concern Present (04/30/2023)  Tobacco Use: Medium Risk (07/24/2023)  Health Literacy: Adequate Health Literacy (01/23/2023)     Readmission Risk Interventions     No data to display

## 2023-08-07 NOTE — Discharge Summary (Signed)
 PATIENT DETAILS Name: Terri Ayala Age: 68 y.o. Sex: female Date of Birth: 1955/09/13 MRN: [id number]. Admitting Physician: Selene Dais, MD [id number], [place], [place]  Admit Date: 07/24/2023 Discharge date: 08/07/2023  Recommendations for Outpatient Follow-up:  Follow up with PCP in 1-2 weeks Please obtain CMP/CBC in one week  Admitted From:  Home  Disposition: Skilled nursing facility   Discharge Condition: good  CODE STATUS:   Code Status: Full Code   Diet recommendation:  Diet Order             Diet - low sodium heart healthy           Diet Heart Room service appropriate? No; Fluid consistency: Thin  Diet effective now                    Brief Summary: Patient is a 68 y.o.  female with history of EtOH use-presented with fall resulting in rib fracture-Hospital course complicated by alcohol  withdrawal symptoms.   Significant events: 5/26>> admit to [facility].   Significant studies: 5/26>> x-ray pelvis: Right hip degenerative changes. 5/26>> CT head: No acute abnormalities 5/26>> CT C-spine: No fracture/subluxation 5/26>> CT T-spine: Acute displaced left eighth rib fracture. 5/26>> CT chest/abdomen/pelvis: Acute posterior left eighth rib fracture.  Remote healed fractures of the right 5th through 10th posterior lateral ribs.  No acute trauma.   Significant microbiology data: None   Procedures: None   Consults: None  Brief Hospital Course: EtOH withdrawal Acute alcohol  encephalopathy Resolved Out of window for any further withdrawal symptoms No longer on CIWA protocol Continue MVI/thiamine /folic acid .   Counseled extensively regarding the need to avoid further ETOH use   Acute posterior left eighth rib fracture Secondary to EtOH intoxicant related fall Continue complain of left flank pain-appears comfortable when examined Continue as needed narcotics/Neurontin /transdermal Lidoderm    HTN BP stable Coreg    GERD PPI   History of  peripheral neuropathy Likely related to alcohol  On Neurontin /Cymbalta .   Depression/anxiety Continue Cymbalta /BuSpar .   Debility/deconditioning PT/OT eval-SNF recommended   Nutrition Status: Nutrition Problem: Moderate Malnutrition Etiology: social / environmental circumstances (EtOH use) Signs/Symptoms: moderate muscle depletion, moderate fat depletion Interventions: Ensure Enlive (each supplement provides 350kcal and 20 grams of protein), Magic cup, MVI, Refer to RD note for recommendations   Discharge Diagnoses:  Active Problems:   Hepatic steatosis   Protein-calorie malnutrition, severe (HCC)   Benign essential HTN   Left rib fracture   Alcohol  dependence with withdrawal (HCC)   Gastroesophageal reflux disease without esophagitis   Prolonged QT interval   Alcoholic peripheral neuropathy (HCC)   Alcohol  withdrawal (HCC)   Elevated TSH   Malnutrition of moderate degree   Discharge Instructions:  Activity:  As tolerated with Full fall precautions use walker/cane & assistance as needed  Discharge Instructions     Call MD for:  extreme fatigue   Complete by: As directed    Call MD for:  persistant dizziness or light-headedness   Complete by: As directed    Diet - low sodium heart healthy   Complete by: As directed    Discharge instructions   Complete by: As directed    Follow with Primary MD  Noreene Bearded, PA in 1-2 weeks  Please get a complete blood count and chemistry panel checked by your Primary MD at your next visit, and again as instructed by your Primary MD.  Get Medicines reviewed and adjusted: Please take all your medications with you for your next visit with  your Primary MD  Laboratory/radiological data: Please request your Primary MD to go over all hospital tests and procedure/radiological results at the follow up, please ask your Primary MD to get all Hospital records sent to his/her office.  In some cases, they will be blood work, cultures and  biopsy results pending at the time of your discharge. Please request that your primary care M.D. follows up on these results.  Also Note the following: If you experience worsening of your admission symptoms, develop shortness of breath, life threatening emergency, suicidal or homicidal thoughts you must seek medical attention immediately by calling 911 or calling your MD immediately  if symptoms less severe.  You must read complete instructions/literature along with all the possible adverse reactions/side effects for all the Medicines you take and that have been prescribed to you. Take any new Medicines after you have completely understood and accpet all the possible adverse reactions/side effects.   Do not drive when taking Pain medications or sleeping medications (Benzodaizepines)  Do not take more than prescribed Pain, Sleep and Anxiety Medications. It is not advisable to combine anxiety,sleep and pain medications without talking with your primary care practitioner  Special Instructions: If you have smoked or chewed Tobacco  in the last 2 yrs please stop smoking, stop any regular Alcohol   and or any Recreational drug use.  Wear Seat belts while driving.  Please note: You were cared for by a hospitalist during your hospital stay. Once you are discharged, your primary care physician will handle any further medical issues. Please note that NO REFILLS for any discharge medications will be authorized once you are discharged, as it is imperative that you return to your primary care physician (or establish a relationship with a primary care physician if you do not have one) for your post hospital discharge needs so that they can reassess your need for medications and monitor your lab values.   Increase activity slowly   Complete by: As directed       Allergies as of 08/07/2023       Reactions   Doxycycline Swelling   Mouth swelling and sores   Tetracycline Hcl Swelling, Other (See Comments)    Mouth swelling and sores   Codeine Itching        Medication List     STOP taking these medications    lidocaine  4 % Replaced by: lidocaine  5 %   propranolol  10 MG tablet Commonly known as: INDERAL    traZODone  100 MG tablet Commonly known as: DESYREL        TAKE these medications    ADULT GUMMY PO Take 2 tablets by mouth daily.   alendronate  70 MG tablet Commonly known as: FOSAMAX  Take 70 mg by mouth every Monday.   baclofen  10 MG tablet Commonly known as: LIORESAL  Take 1 tablet (10 mg total) by mouth 3 (three) times daily.   buPROPion  150 MG 24 hr tablet Commonly known as: Wellbutrin  XL Take 1 tablet (150 mg total) by mouth every morning.   busPIRone  15 MG tablet Commonly known as: BUSPAR  Take 1 tablet (15 mg total) by mouth 3 (three) times daily.   CALCIUM  600+D PO Take 1 tablet by mouth daily.   carvedilol  6.25 MG tablet Commonly known as: COREG  TAKE 1 TABLET BY MOUTH 2 TIMES DAILY WITH A MEAL.   DULoxetine  60 MG capsule Commonly known as: CYMBALTA  Take 60 mg by mouth 2 (two) times daily.   feeding supplement Liqd Take 237 mLs by mouth 3 (three)  times daily between meals.   fluticasone  50 MCG/ACT nasal spray Commonly known as: FLONASE  Place 2 sprays into both nostrils daily as needed for allergies.   folic acid  1 MG tablet Commonly known as: FOLVITE  TAKE 1 TABLET BY MOUTH DAILY.   gabapentin  300 MG capsule Commonly known as: NEURONTIN  TAKE 1 CAPSULE BY MOUTH 2 TIMES DAILY.   hydrOXYzine  25 MG tablet Commonly known as: ATARAX  Take 25 mg by mouth 3 (three) times daily.   lidocaine  5 % Commonly known as: LIDODERM  Place 1 patch onto the skin daily. Remove & Discard patch within 12 hours or as directed by MD.  Apply to left flank area. Start taking on: August 08, 2023 Replaces: lidocaine  4 %   lidocaine  5 % Commonly known as: LIDODERM  Place 1 patch onto the skin daily. Remove & Discard patch within 12 hours or as directed by MD-apply to  lower back area. Start taking on: August 08, 2023   NON FORMULARY Testosterone  20 mg/66ml. Use small pea-sized amount once a week as directed   norethindrone -ethinyl estradiol  1-5 MG-MCG Tabs tablet Commonly known as: FEMHRT  1/5 Take 1 tablet by mouth daily.   oxyCODONE  5 MG immediate release tablet Commonly known as: Roxicodone  Take 1 tablet (5 mg total) by mouth every 6 (six) hours as needed for up to 5 doses for severe pain (pain score 7-10).   pantoprazole  40 MG tablet Commonly known as: PROTONIX  TAKE 1 TABLET BY MOUTH 2 TIMES DAILY.   QUEtiapine  25 MG tablet Commonly known as: SEROQUEL  Take 1 tablet (25 mg total) by mouth at bedtime.   SUMAtriptan  100 MG tablet Commonly known as: IMITREX  TAKE 1 TABLET (100 MG TOTAL) BY MOUTH AS NEEDED FOR MIGRAINE. MAY REPEAT IN 2 HOURS IF HEADACHE PERSISTS OR RECURS.        Contact information for follow-up providers     [facility] .   Specialty: Emergency Medicine Why: If symptoms worsen Contact information: [address] [place]  [address] [phone]        Noreene Bearded, [place]. Schedule an appointment as soon as possible for a visit .   Specialties: Family Medicine, Obstetrics Contact information: [address] [place] [address] [phone]              Contact information for after-discharge care     Destination     [facility] [place], [place] Preferred SNF .   Service: Skilled Nursing Contact information: [address] [place]  [address] [phone]                    Allergies  Allergen Reactions   Doxycycline Swelling    Mouth swelling and sores     Tetracycline Hcl Swelling and Other (See Comments)    Mouth swelling and sores   Codeine Itching     Other Procedures/Studies: EEG adult Result Date: 07/29/2023 Arleene Lack, MD     07/29/2023 10:51 AM Patient Name: Terri Ayala MRN: [id number] Epilepsy Attending: Arleene Lack Referring Physician/Provider: Epifanio Haste, MD Date: 07/29/2023 Duration: 25.05 mins Patient history: 68yo F with alcohol  use presented with LOC. EEG to evaluate for seizure. Level of alertness: Awake, drowsy AEDs during EEG study: GBP Technical aspects: This EEG study was done with scalp electrodes positioned according to the 10-20 International system of electrode placement. Electrical activity was reviewed with band pass filter of 1-70Hz , sensitivity of 7  uV/mm, display speed of 21mm/sec with a 60Hz  notched filter applied as appropriate. EEG data were recorded continuously and digitally stored.  Video monitoring was available and reviewed as appropriate. Description: The posterior dominant rhythm consists of 8 Hz activity of moderate voltage (25-35 uV) seen predominantly in posterior head regions, symmetric and reactive to eye opening and eye closing. Drowsiness was characterized by attenuation of the posterior background rhythm. Hyperventilation and photic stimulation were not performed.   IMPRESSION: This study is within normal limits. No seizures or epileptiform discharges were seen throughout the recording. Arleene Lack   ECHOCARDIOGRAM COMPLETE Result Date: 07/25/2023    ECHOCARDIOGRAM REPORT   Patient Name:   Terri Ayala Date of Exam: 07/25/2023 Medical Rec #:  [id number]    Height:       62.0 in Accession #:    [id number]   Weight:       120.0 lb Date of Birth:  Aug 02, 1955    BSA:          1.539 m Patient Age:    67 years     BP:           123/73 mmHg Patient Gender: F            HR:           109 bpm. Exam Location:  Inpatient Procedure: 2D Echo, Cardiac Doppler and Color Doppler (Both Spectral and Color            Flow Doppler were utilized during procedure). Indications:    Murmur  History:        Patient has prior history of Echocardiogram examinations. Risk                 Factors:Former Smoker, Hypertension and ETOH Abuse.   Sonographer:    Willey Harrier Referring Phys: [id number] ANASTASSIA DOUTOVA IMPRESSIONS  1. Left ventricular ejection fraction, by estimation, is 70 to 75%. The left ventricle has hyperdynamic function. The left ventricle has no regional wall motion abnormalities. Indeterminate diastolic filling due to E-A fusion.  2. Right ventricular systolic function is normal. The right ventricular size is normal. There is normal pulmonary artery systolic pressure. The estimated right ventricular systolic pressure is 19.0 mmHg.  3. The mitral valve is grossly normal. Trivial mitral valve regurgitation. No evidence of mitral stenosis.  4. The aortic valve is tricuspid. Aortic valve regurgitation is not visualized. No aortic stenosis is present.  5. The inferior vena cava is normal in size with greater than 50% respiratory variability, suggesting right atrial pressure of 3 mmHg. Conclusion(s)/Recommendation(s): Normal biventricular function without evidence of hemodynamically significant valvular heart disease. FINDINGS  Left Ventricle: Left ventricular ejection fraction, by estimation, is 70 to 75%. The left ventricle has hyperdynamic function. The left ventricle has no regional wall motion abnormalities. The left ventricular internal cavity size was normal in size. There is no left ventricular hypertrophy. Indeterminate diastolic filling due to E-A fusion. Right Ventricle: The right ventricular size is normal. No increase in right ventricular wall thickness. Right ventricular systolic function is normal. There is normal pulmonary artery systolic pressure. The tricuspid regurgitant velocity is 2.00 m/s, and  with an assumed right atrial pressure of 3 mmHg, the estimated right ventricular systolic pressure is 19.0 mmHg. Left Atrium: Left atrial size was normal in size. Right Atrium: Right atrial size was normal in size. Pericardium: There is no evidence of pericardial effusion. Mitral Valve: The mitral valve is grossly normal. Trivial  mitral valve regurgitation.  No evidence of mitral valve stenosis. MV peak gradient, 6.4 mmHg. The mean mitral valve gradient is 3.0 mmHg. Tricuspid Valve: The tricuspid valve is grossly normal. Tricuspid valve regurgitation is mild . No evidence of tricuspid stenosis. Aortic Valve: The aortic valve is tricuspid. Aortic valve regurgitation is not visualized. No aortic stenosis is present. Aortic valve peak gradient measures 10.2 mmHg. Pulmonic Valve: The pulmonic valve was grossly normal. Pulmonic valve regurgitation is not visualized. No evidence of pulmonic stenosis. Aorta: The aortic root and ascending aorta are structurally normal, with no evidence of dilitation. Venous: The inferior vena cava is normal in size with greater than 50% respiratory variability, suggesting right atrial pressure of 3 mmHg. IAS/Shunts: The atrial septum is grossly normal.  LEFT VENTRICLE PLAX 2D LVIDd:         4.30 cm   Diastology LVIDs:         2.80 cm   LV e' medial:    8.16 cm/s LV PW:         0.90 cm   LV E/e' medial:  9.9 LV IVS:        1.10 cm   LV e' lateral:   11.50 cm/s LVOT diam:     1.90 cm   LV E/e' lateral: 7.0 LV SV:         64 LV SV Index:   41 LVOT Area:     2.84 cm  RIGHT VENTRICLE             IVC RV Basal diam:  3.50 cm     IVC diam: 1.15 cm RV S prime:     13.60 cm/s TAPSE (M-mode): 2.4 cm LEFT ATRIUM             Index        RIGHT ATRIUM           Index LA Vol (A2C):   42.9 ml 27.88 ml/m  RA Area:     11.20 cm LA Vol (A4C):   22.2 ml 14.43 ml/m  RA Volume:   22.80 ml  14.82 ml/m LA Biplane Vol: 31.9 ml 20.73 ml/m  AORTIC VALVE AV Area (Vmax): 2.37 cm AV Vmax:        160.00 cm/s AV Peak Grad:   10.2 mmHg LVOT Vmax:      134.00 cm/s LVOT Vmean:     91.600 cm/s LVOT VTI:       0.225 m  AORTA Ao Root diam: 3.10 cm Ao Asc diam:  3.50 cm MITRAL VALVE                TRICUSPID VALVE MV Area (PHT): 5.54 cm     TR Peak grad:   16.0 mmHg MV Area VTI:   3.80 cm     TR Vmax:        200.00 cm/s MV Peak grad:  6.4 mmHg MV  Mean grad:  3.0 mmHg     SHUNTS MV Vmax:       1.26 m/s     Systemic VTI:  0.22 m MV Vmean:      83.8 cm/s    Systemic Diam: 1.90 cm MV Decel Time: 137 msec MV E velocity: 81.00 cm/s MV A velocity: 121.00 cm/s MV E/A ratio:  0.67 Jackquelyn Mass MD Electronically signed by Jackquelyn Mass MD Signature Date/Time: 07/25/2023/12:59:42 PM    Final    CT T-SPINE NO CHARGE Result Date: 07/24/2023 EXAM: CT THORACIC SPINE WITHOUT CONTRAST 07/24/2023 12:37:00 PM TECHNIQUE: CT  of the thoracic spine was performed without the administration of intravenous contrast. Multiplanar reformatted images are provided for review. Automated exposure control, iterative reconstruction, and/or weight based adjustment of the mA/kV was utilized to reduce the radiation dose to as low as reasonably achievable. COMPARISON: None available. CLINICAL HISTORY: Head trauma, moderate-severe; Polytrauma, blunt. FINDINGS: BONES AND ALIGNMENT: There is normal alignment of the spine. The vertebral body heights are maintained. No osseous destructive lesion is seen. An acute fracture of the posterior left eighth rib is displaced 1.5 cm the shaft width. Remote right fifth through 10th rib fractures are present. A remote posteromedial left fifth rib fracture is present. DEGENERATIVE CHANGES: No gross spinal canal stenosis or bony neural foraminal narrowing of the thoracic spine. SOFT TISSUES: No paraspinal mass or hematoma. LIMITED CHEST: Limited images of the chest demonstrate no pneumothorax. Adjacent pleural thickening is present. IMPRESSION: 1. Acute displaced fracture of the posterior left eighth rib with adjacent pleural thickening. No pneumothorax. 2. Remote right fifth through 10th rib fractures and remote posteromedial left fifth rib fracture. Electronically signed by: Audree Leas MD 07/24/2023 01:01 PM EDT RP Workstation: [id number]   CT HEAD WO CONTRAST Result Date: 07/24/2023 CLINICAL DATA:  Head trauma, moderate-severe Fall down stairs at  home. EXAM: CT HEAD WITHOUT CONTRAST TECHNIQUE: Contiguous axial images were obtained from the base of the skull through the vertex without intravenous contrast. RADIATION DOSE REDUCTION: This exam was performed according to the departmental dose-optimization program which includes automated exposure control, adjustment of the mA and/or kV according to patient size and/or use of iterative reconstruction technique. COMPARISON:  Head CT 02/17/2022 FINDINGS: Brain: No intracranial hemorrhage, mass effect, or midline shift. Stable generalized atrophy. No hydrocephalus. The basilar cisterns are patent. Mild chronic small vessel ischemia, similar to prior exam. No evidence of territorial infarct or acute ischemia. Chronic left cerebellar calcification. No extra-axial or intracranial fluid collection. Vascular: No hyperdense vessel or unexpected calcification. Skull: No fracture or focal lesion. Sinuses/Orbits: Paranasal sinuses and mastoid air cells are clear. The visualized orbits are unremarkable. Other: No confluent scalp hematoma. IMPRESSION: 1. No acute intracranial abnormality. No skull fracture. 2. Stable atrophy and chronic small vessel ischemia. Electronically Signed   By: Chadwick Colonel M.D.   On: 07/24/2023 12:59   CT CHEST ABDOMEN PELVIS W CONTRAST Result Date: 07/24/2023 EXAM: CT CHEST, ABDOMEN AND PELVIS WITH CONTRAST 07/24/2023 12:37:00 PM TECHNIQUE: CT of the chest, abdomen and pelvis was performed with the administration of intravenous contrast. Multiplanar reformatted images are provided for review. Automated exposure control, iterative reconstruction, and/or weight based adjustment of the mA/kV was utilized to reduce the radiation dose to as low as reasonably achievable. COMPARISON: Rib radiographs 09/13/2022. Chest and rib radiographs 09/01/2022. CT angio chest 09/27/2020. CT abdomen and pelvis 09/26/2020. CLINICAL HISTORY: Marvell Slider down steps back pain and left rib pain. FINDINGS: CHEST: MEDIASTINUM:  Heart and pericardium are unremarkable. The central airways are clear. A small hiatal hernia is present. THORACIC LYMPH NODES: No mediastinal, hilar or axillary lymphadenopathy. LUNGS AND PLEURA: Mild-dependent atelectasis is present in both lungs. Pleural thickening is present. No pneumothorax is present. ABDOMEN AND PELVIS: LIVER: Mild diffuse fatty infiltration of the liver is present. GALLBLADDER AND BILE DUCTS: Cholecystectomy noted. SPLEEN: No acute abnormality. PANCREAS: No acute abnormality. ADRENAL GLANDS: No acute abnormality. KIDNEYS, URETERS AND BLADDER: No stones in the kidneys or ureters. No hydronephrosis. No perinephric or periureteral stranding. Urinary bladder is unremarkable. GI AND BOWEL: Stomach demonstrates no acute abnormality. There is no bowel obstruction.  No appendicitis. REPRODUCTIVE ORGANS: No acute abnormality. PERITONEUM AND RETROPERITONEUM: No ascites. No free air. VASCULATURE: Mild atherosclerotic changes are present within the abdominal aorta and branch vessels without aneurysm. ABDOMINAL AND PELVIS LYMPH NODES: No lymphadenopathy. REPRODUCTIVE ORGANS: No acute abnormality. BONES AND SOFT TISSUES: Remote healed fractures are present in the right fifth through tenth posterolateral ribs. A healed posterior medial left fifth rib fracture is noted. present. An acute posterior left eighth rib fracture is displaced by half the shaft width. IMPRESSION: 1. Acute posterior left eighth rib fracture, displaced by half the shaft width. 2. Remote healed fractures in the right fifth through tenth posterolateral ribs and a healed posterior medial left fifth rib fracture. 3. No other acute trauma to the chest, abdomen, or pelvis Electronically signed by: Audree Leas MD 07/24/2023 12:59 PM EDT RP Workstation: [id number]   CT CERVICAL SPINE WO CONTRAST Result Date: 07/24/2023 CLINICAL DATA:  Blunt trauma.  Fall down steps at home. EXAM: CT CERVICAL SPINE WITHOUT CONTRAST TECHNIQUE:  Multidetector CT imaging of the cervical spine was performed without intravenous contrast. Multiplanar CT image reconstructions were also generated. RADIATION DOSE REDUCTION: This exam was performed according to the departmental dose-optimization program which includes automated exposure control, adjustment of the mA and/or kV according to patient size and/or use of iterative reconstruction technique. COMPARISON:  CT cervical spine 11/26/2021 FINDINGS: Alignment: Trace anterolisthesis of C5 on C6. no traumatic subluxation. Skull base and vertebrae: No acute fracture. Vertebral body heights are maintained. The dens and skull base are intact. None fusion posterior arch of C1. Soft tissues and spinal canal: No prevertebral fluid or swelling. No visible canal hematoma. Disc levels: Disc space narrowing and spurring C5-C6 and C6-C7. There is multilevel facet hypertrophy and facet ankylosis. No high-grade canal stenosis. Upper chest: Assessed on concurrent chest CT, reported separately. Bilateral thyroid  nodules. This has been evaluated on previous imaging. (ref: J Am Coll Radiol. 2015 Feb;12(2): 143-50).Reference thyroid  ultrasound 02/11/2021 Other: Carotid calcifications. IMPRESSION: 1. No acute fracture or subluxation of the cervical spine. 2. Multilevel degenerative disc disease and facet hypertrophy. Electronically Signed   By: Chadwick Colonel M.D.   On: 07/24/2023 12:55   DG Pelvis Portable Result Date: 07/24/2023 CLINICAL DATA:  Trauma. EXAM: PORTABLE PELVIS 1 VIEWS COMPARISON:  02/06/2023. FINDINGS: There is right-sided hip degenerative change with joint space narrowing and small osteophytes. Pelvic ring is intact. No acute fracture, dislocation or subluxation. No osteolytic or osteoblastic lesions. IMPRESSION: Right hip degenerative changes.  No acute osseous abnormalities. Electronically Signed   By: Sydell Eva M.D.   On: 07/24/2023 10:51     TODAY-DAY OF DISCHARGE:  Subjective:   Terri Ayala  today has no headache,no chest abdominal pain,no new weakness tingling or numbness, feels much better wants to go home today.  Objective:   Blood pressure 117/65, pulse 83, temperature (!) 97.5 F (36.4 C), temperature source Oral, resp. rate 17, height 5\' 2"  (1.575 m), weight 54.4 kg, SpO2 97%.  Intake/Output Summary (Last 24 hours) at 08/07/2023 1127 Last data filed at 08/07/2023 1000 Gross per 24 hour  Intake 120 ml  Output 1000 ml  Net -880 ml   Filed Weights   07/24/23 1002  Weight: 54.4 kg    Exam: Awake Alert, Oriented *3, No new F.N deficits, Normal affect [place].AT,PERRAL Supple Neck,No JVD, No cervical lymphadenopathy appriciated.  Symmetrical Chest wall movement, Good air movement bilaterally, CTAB RRR,No Gallops,Rubs or new Murmurs, No Parasternal Heave +ve B.Sounds, Abd Soft, Non tender, No organomegaly appriciated, No  rebound -guarding or rigidity. No Cyanosis, Clubbing or edema, No new Rash or bruise   PERTINENT RADIOLOGIC STUDIES: No results found.   PERTINENT LAB RESULTS: CBC: No results for input(s): "WBC", "HGB", "HCT", "PLT" in the last 72 hours. CMET CMP     Component Value Date/Time   NA 138 08/02/2023 0437   NA 142 05/23/2022 0943   K 4.0 08/02/2023 0437   CL 106 08/02/2023 0437   CO2 23 08/02/2023 0437   GLUCOSE 104 (H) 08/02/2023 0437   BUN 11 08/02/2023 0437   BUN 12 05/23/2022 0943   CREATININE 0.72 08/02/2023 0437   CREATININE 0.39 (L) 04/22/2013 1416   CALCIUM  9.2 08/02/2023 0437   PROT 5.9 (L) 07/25/2023 0500   PROT 6.6 05/23/2022 0943   ALBUMIN 3.1 (L) 07/25/2023 0500   ALBUMIN 4.6 05/23/2022 0943   AST 36 07/25/2023 0500   ALT 14 07/25/2023 0500   ALKPHOS 58 07/25/2023 0500   BILITOT 1.0 07/25/2023 0500   BILITOT 0.3 05/23/2022 0943   EGFR 66 05/23/2022 0943   GFRNONAA >60 08/02/2023 0437    GFR Estimated Creatinine Clearance: 53.2 mL/min (by C-G formula based on SCr of 0.72 mg/dL). No results for input(s): "LIPASE", "AMYLASE"  in the last 72 hours. No results for input(s): "CKTOTAL", "CKMB", "CKMBINDEX", "TROPONINI" in the last 72 hours. Invalid input(s): "POCBNP" No results for input(s): "DDIMER" in the last 72 hours. No results for input(s): "HGBA1C" in the last 72 hours. No results for input(s): "CHOL", "HDL", "LDLCALC", "TRIG", "CHOLHDL", "LDLDIRECT" in the last 72 hours. No results for input(s): "TSH", "T4TOTAL", "T3FREE", "THYROIDAB" in the last 72 hours.  Invalid input(s): "FREET3" No results for input(s): "VITAMINB12", "FOLATE", "FERRITIN", "TIBC", "IRON", "RETICCTPCT" in the last 72 hours. Coags: No results for input(s): "INR" in the last 72 hours.  Invalid input(s): "PT" Microbiology: No results found for this or any previous visit (from the past 240 hours).  FURTHER DISCHARGE INSTRUCTIONS:  Get Medicines reviewed and adjusted: Please take all your medications with you for your next visit with your Primary MD  Laboratory/radiological data: Please request your Primary MD to go over all hospital tests and procedure/radiological results at the follow up, please ask your Primary MD to get all Hospital records sent to his/her office.  In some cases, they will be blood work, cultures and biopsy results pending at the time of your discharge. Please request that your primary care M.D. goes through all the records of your hospital data and follows up on these results.  Also Note the following: If you experience worsening of your admission symptoms, develop shortness of breath, life threatening emergency, suicidal or homicidal thoughts you must seek medical attention immediately by calling 911 or calling your MD immediately  if symptoms less severe.  You must read complete instructions/literature along with all the possible adverse reactions/side effects for all the Medicines you take and that have been prescribed to you. Take any new Medicines after you have completely understood and accpet all the possible  adverse reactions/side effects.   Do not drive when taking Pain medications or sleeping medications (Benzodaizepines)  Do not take more than prescribed Pain, Sleep and Anxiety Medications. It is not advisable to combine anxiety,sleep and pain medications without talking with your primary care practitioner  Special Instructions: If you have smoked or chewed Tobacco  in the last 2 yrs please stop smoking, stop any regular Alcohol   and or any Recreational drug use.  Wear Seat belts while driving.  Please note:  You were cared for by a hospitalist during your hospital stay. Once you are discharged, your primary care physician will handle any further medical issues. Please note that NO REFILLS for any discharge medications will be authorized once you are discharged, as it is imperative that you return to your primary care physician (or establish a relationship with a primary care physician if you do not have one) for your post hospital discharge needs so that they can reassess your need for medications and monitor your lab values.  Total Time spent coordinating discharge including counseling, education and face to face time equals greater than 30 minutes.  SignedKimberly Penna 08/07/2023 11:27 AM

## 2023-08-07 NOTE — Progress Notes (Signed)
 Called report to [name], LPN from [name]. Pt going to [facility] to [facility] [phone].   [name], RN

## 2023-08-07 NOTE — Progress Notes (Signed)
 PROGRESS NOTE        PATIENT DETAILS Name: Terri Ayala Age: 68 y.o. Sex: female Date of Birth: 1955-11-13 Admit Date: 07/24/2023 Admitting Physician Selene Dais, MD WUJ:[id number], [place], [place]  Brief Summary: Patient is a 68 y.o.  female with history of EtOH use-presented with fall resulting in rib fracture-Hospital course complicated by alcohol  withdrawal symptoms.  Significant events: 5/26>> admit to [facility].  Significant studies: 5/26>> x-ray pelvis: Right hip degenerative changes. 5/26>> CT head: No acute abnormalities 5/26>> CT C-spine: No fracture/subluxation 5/26>> CT T-spine: Acute displaced left eighth rib fracture. 5/26>> CT chest/abdomen/pelvis: Acute posterior left eighth rib fracture.  Remote healed fractures of the right 5th through 10th posterior lateral ribs.  No acute trauma.  Significant microbiology data: None  Procedures: None  Consults: None  Subjective: Sleeping comfortably-continues to complain of some intermittent left flank pain and now low back pain.  Objective: Vitals: Blood pressure 117/65, pulse 83, temperature (!) 97.5 F (36.4 C), temperature source Oral, resp. rate 17, height 5\' 2"  (1.575 m), weight 54.4 kg, SpO2 97%.   Exam: Not any distress Awake/alert Chest: Clear to auscultation Abdomen: Soft nontender nondistended Neurology: Moving all 4 extremities-answering all questions appropriately.  Pertinent Labs/Radiology:    Latest Ref Rng & Units 08/02/2023    4:37 AM 08/01/2023    4:30 AM 07/30/2023    2:27 AM  CBC  WBC 4.0 - 10.5 K/uL 7.4  7.7  8.2   Hemoglobin 12.0 - 15.0 g/dL 82.9  56.2  13.0   Hematocrit 36.0 - 46.0 % 36.2  35.2  30.9   Platelets 150 - 400 K/uL 328  342  255     Lab Results  Component Value Date   NA 138 08/02/2023   K 4.0 08/02/2023   CL 106 08/02/2023   CO2 23 08/02/2023     Assessment/Plan: EtOH withdrawal Acute alcohol  encephalopathy Resolved Should be out of window for  any further withdrawal symptoms No longer on CIWA protocol Continue MVI/thiamine /folic acid .    Acute posterior left eighth rib fracture Secondary to EtOH intoxicant related fall Continue complain of left flank pain-appears comfortable when examined Continue as needed narcotics/Neurontin  Add transdermal Lidoderm .    HTN BP stable Coreg   GERD PPI  History of peripheral neuropathy Likely related to alcohol  On Neurontin /Cymbalta .  Depression/anxiety Continue Cymbalta /BuSpar .  Debility/deconditioning PT/OT eval-SNF recommended-awaiting SNF placement at this point-medically stable to transfer..  Nutrition Status: Nutrition Problem: Moderate Malnutrition Etiology: social / environmental circumstances (EtOH use) Signs/Symptoms: moderate muscle depletion, moderate fat depletion Interventions: Ensure Enlive (each supplement provides 350kcal and 20 grams of protein), Magic cup, MVI, Refer to RD note for recommendations  Code status:   Code Status: Full Code   DVT Prophylaxis: enoxaparin  (LOVENOX ) injection 30 mg Start: 08/02/23 1230 SCDs Start: 07/24/23 1907   Family Communication: None at bedside   Disposition Plan: Status is: Inpatient Remains inpatient appropriate because: Severity of illness   Planned Discharge Destination:Skilled nursing facility   Diet: Diet Order             Diet Heart Room service appropriate? No; Fluid consistency: Thin  Diet effective now                     Antimicrobial agents: Anti-infectives (From admission, onward)    None        MEDICATIONS:  Scheduled Meds:  baclofen   10 mg Oral TID   buPROPion   150 mg Oral BH-q7a   busPIRone   15 mg Oral TID   carvedilol   6.25 mg Oral BID WC   DULoxetine   60 mg Oral BID   enoxaparin  (LOVENOX ) injection  30 mg Subcutaneous Q24H   feeding supplement  237 mL Oral TID BM   folic acid   1 mg Oral Daily   gabapentin   300 mg Oral BID   multivitamin with minerals  1 tablet Oral Daily    pantoprazole   40 mg Oral BID   polyethylene glycol  17 g Oral Daily   QUEtiapine   25 mg Oral QHS   senna-docusate  2 tablet Oral QHS   thiamine   100 mg Oral Daily   Continuous Infusions: PRN Meds:.acetaminophen  **OR** acetaminophen , bisacodyl , HYDROcodone -acetaminophen , prochlorperazine    I have personally reviewed following labs and imaging studies  LABORATORY DATA: CBC: Recent Labs  Lab 08/01/23 0430 08/02/23 0437  WBC 7.7 7.4  HGB 11.6* 11.9*  HCT 35.2* 36.2  MCV 93.4 93.1  PLT 342 328    Basic Metabolic Panel: Recent Labs  Lab 08/01/23 0430 08/02/23 0437  NA 139 138  K 4.8 4.0  CL 103 106  CO2 24 23  GLUCOSE 101* 104*  BUN 8 11  CREATININE 0.78 0.72  CALCIUM  9.0 9.2  MG 1.9 1.9  PHOS 5.4* 5.9*    GFR: Estimated Creatinine Clearance: 53.2 mL/min (by C-G formula based on SCr of 0.72 mg/dL).  Liver Function Tests: No results for input(s): "AST", "ALT", "ALKPHOS", "BILITOT", "PROT", "ALBUMIN" in the last 168 hours. No results for input(s): "LIPASE", "AMYLASE" in the last 168 hours. No results for input(s): "AMMONIA" in the last 168 hours.  Coagulation Profile: No results for input(s): "INR", "PROTIME" in the last 168 hours.  Cardiac Enzymes: No results for input(s): "CKTOTAL", "CKMB", "CKMBINDEX", "TROPONINI" in the last 168 hours.  BNP (last 3 results) No results for input(s): "PROBNP" in the last 8760 hours.  Lipid Profile: No results for input(s): "CHOL", "HDL", "LDLCALC", "TRIG", "CHOLHDL", "LDLDIRECT" in the last 72 hours.  Thyroid  Function Tests: No results for input(s): "TSH", "T4TOTAL", "FREET4", "T3FREE", "THYROIDAB" in the last 72 hours.  Anemia Panel: No results for input(s): "VITAMINB12", "FOLATE", "FERRITIN", "TIBC", "IRON", "RETICCTPCT" in the last 72 hours.  Urine analysis:    Component Value Date/Time   COLORURINE STRAW (A) 02/15/2022 1750   APPEARANCEUR CLEAR 02/15/2022 1750   LABSPEC 1.006 02/15/2022 1750   LABSPEC 1.030  09/01/2017 1443   PHURINE 7.0 02/15/2022 1750   GLUCOSEU NEGATIVE 02/15/2022 1750   HGBUR NEGATIVE 02/15/2022 1750   BILIRUBINUR NEGATIVE 02/15/2022 1750   BILIRUBINUR neg 08/28/2019 1612   KETONESUR 20 (A) 02/15/2022 1750   PROTEINUR NEGATIVE 02/15/2022 1750   UROBILINOGEN 0.2 08/28/2019 1612   UROBILINOGEN 0.2 07/25/2007 0900   NITRITE NEGATIVE 02/15/2022 1750   LEUKOCYTESUR TRACE (A) 02/15/2022 1750    Sepsis Labs: Lactic Acid, Venous    Component Value Date/Time   LATICACIDVEN 0.9 06/09/2021 0443    MICROBIOLOGY: No results found for this or any previous visit (from the past 240 hours).  RADIOLOGY STUDIES/RESULTS: No results found.   LOS: 14 days   Kimberly Penna, MD  [facility]    To contact the attending provider between 7A-7P or the covering provider during after hours 7P-7A, please log into the web site [email or web address] and access using universal [place] password for that web site. If you do not have the password, please  call the hospital operator.  08/07/2023, 8:46 AM

## 2023-08-07 NOTE — Progress Notes (Signed)
 Unable to reassess the pain since pt being d/c to SNF,

## 2023-08-14 ENCOUNTER — Other Ambulatory Visit: Payer: Self-pay | Admitting: Family Medicine

## 2023-08-14 DIAGNOSIS — G43009 Migraine without aura, not intractable, without status migrainosus: Secondary | ICD-10-CM

## 2023-09-02 ENCOUNTER — Other Ambulatory Visit (HOSPITAL_COMMUNITY): Payer: Self-pay | Admitting: Medical

## 2023-09-04 ENCOUNTER — Other Ambulatory Visit: Payer: Self-pay | Admitting: Family Medicine

## 2023-09-04 DIAGNOSIS — I10 Essential (primary) hypertension: Secondary | ICD-10-CM

## 2023-09-05 ENCOUNTER — Ambulatory Visit: Payer: Federal, State, Local not specified - PPO | Admitting: Podiatry

## 2023-09-07 ENCOUNTER — Other Ambulatory Visit: Payer: Self-pay

## 2023-09-07 ENCOUNTER — Telehealth (HOSPITAL_COMMUNITY): Admitting: Medical

## 2023-09-07 ENCOUNTER — Emergency Department (HOSPITAL_COMMUNITY)

## 2023-09-07 ENCOUNTER — Other Ambulatory Visit (HOSPITAL_COMMUNITY): Payer: Self-pay | Admitting: *Deleted

## 2023-09-07 ENCOUNTER — Encounter (HOSPITAL_COMMUNITY): Payer: Self-pay

## 2023-09-07 ENCOUNTER — Emergency Department (HOSPITAL_COMMUNITY): Admission: EM | Admit: 2023-09-07 | Discharge: 2023-09-07 | Disposition: A | Discharge status: Home / Self Care

## 2023-09-07 DIAGNOSIS — W19XXXA Unspecified fall, initial encounter: Secondary | ICD-10-CM

## 2023-09-07 DIAGNOSIS — Z79899 Other long term (current) drug therapy: Secondary | ICD-10-CM | POA: Diagnosis not present

## 2023-09-07 DIAGNOSIS — W01198A Fall on same level from slipping, tripping and stumbling with subsequent striking against other object, initial encounter: Secondary | ICD-10-CM | POA: Diagnosis not present

## 2023-09-07 DIAGNOSIS — Y92009 Unspecified place in unspecified non-institutional (private) residence as the place of occurrence of the external cause: Secondary | ICD-10-CM | POA: Diagnosis not present

## 2023-09-07 DIAGNOSIS — S0003XA Contusion of scalp, initial encounter: Secondary | ICD-10-CM | POA: Insufficient documentation

## 2023-09-07 DIAGNOSIS — R519 Headache, unspecified: Secondary | ICD-10-CM | POA: Diagnosis present

## 2023-09-07 DIAGNOSIS — I10 Essential (primary) hypertension: Secondary | ICD-10-CM | POA: Insufficient documentation

## 2023-09-07 DIAGNOSIS — M25571 Pain in right ankle and joints of right foot: Secondary | ICD-10-CM | POA: Insufficient documentation

## 2023-09-07 MED ORDER — BUPROPION HCL ER (XL) 150 MG PO TB24: 150.0000 mg | ORAL_TABLET | ORAL | 0 refills | Status: DC

## 2023-09-07 MED ORDER — PROPRANOLOL HCL 10 MG PO TABS
ORAL_TABLET | ORAL | 0 refills | Status: DC
Start: 2023-09-07 — End: 2023-09-14

## 2023-09-07 MED ORDER — BACLOFEN 10 MG PO TABS: 10.0000 mg | ORAL_TABLET | Freq: Three times a day (TID) | ORAL | 0 refills | Status: DC

## 2023-09-07 MED ORDER — ACETAMINOPHEN 325 MG PO TABS
650.0000 mg | ORAL_TABLET | Freq: Once | ORAL | Status: AC
  Administered 2023-09-07: 650 mg via ORAL
  Filled 2023-09-07: qty 2

## 2023-09-07 NOTE — ED Provider Notes (Signed)
 [place] EMERGENCY DEPARTMENT AT [facility] Provider Note   CSN: [id number] Arrival date & time: 09/07/23  9074     Patient presents with: Terri Ayala is a 68 y.o. female.   Terri Ayala is a 68 y.o. female with a history of thyroid  disease, hypertension, hyperlipidemia, SVT, GERD, who presents to the ED for evaluation after mechanical fall.  Patient reports she typically uses a walker and the leg of her walker got hung up on her bedside commode causing her to trip and she fell backwards striking the back of her head on the brick fireplace.  No loss of consciousness and no bleeding or laceration reported but patient does report headache.  No visual changes, nausea or vomiting, no numbness or weakness.  Patient does report feeling mildly dizzy immediately after the fall.  Also reports some mild pain in her ankle since the fall, does not know what she hit it on.  Denies neck or back pain.  No other aggravating or alleviating factors.  Not on blood thinners.   Fall Associated symptoms include headaches.       Prior to Admission medications   Medication Sig Start Date End Date Taking? Authorizing Provider  alendronate  (FOSAMAX ) 70 MG tablet Take 70 mg by mouth every Monday.    [provider]  baclofen  (LIORESAL ) 10 MG tablet Take 1 tablet (10 mg total) by mouth 3 (three) times daily. 08/07/23 08/06/24  Ghimire, Donalda CHRISTELLA, MD  buPROPion  (WELLBUTRIN  XL) 150 MG 24 hr tablet Take 1 tablet (150 mg total) by mouth every morning. 07/06/23 11/03/23  Kober, Charles E, PA-C  busPIRone  (BUSPAR ) 15 MG tablet Take 1 tablet (15 mg total) by mouth 3 (three) times daily. 08/07/23   Ghimire, Donalda CHRISTELLA, MD  Calcium  Carbonate-Vitamin D  (CALCIUM  600+D PO) Take 1 tablet by mouth daily.    [provider]  carvedilol  (COREG ) 6.25 MG tablet TAKE 1 TABLET BY MOUTH 2 TIMES DAILY WITH A MEAL. 09/04/23   Gayle Numbers F, PA-C  DULoxetine  (CYMBALTA ) 60 MG capsule Take 60 mg by mouth 2 (two)  times daily. 05/05/23   [provider]  feeding supplement (ENSURE PLUS HIGH PROTEIN) LIQD Take 237 mLs by mouth 3 (three) times daily between meals. 08/07/23   Ghimire, Donalda CHRISTELLA, MD  fluticasone  (FLONASE ) 50 MCG/ACT nasal spray Place 2 sprays into both nostrils daily as needed for allergies.    [provider]  folic acid  (FOLVITE ) 1 MG tablet TAKE 1 TABLET BY MOUTH DAILY. 06/27/23   Chandra Toribio POUR, MD  gabapentin  (NEURONTIN ) 300 MG capsule TAKE 1 CAPSULE BY MOUTH 2 TIMES DAILY. 04/03/23   Wallace Joesph LABOR, PA  hydrOXYzine  (ATARAX ) 25 MG tablet Take 25 mg by mouth 3 (three) times daily. 01/18/23   [provider]  lidocaine  (LIDODERM ) 5 % Place 1 patch onto the skin daily. Remove & Discard patch within 12 hours or as directed by MD.  Apply to left flank area. 08/08/23   Ghimire, Donalda CHRISTELLA, MD  lidocaine  (LIDODERM ) 5 % Place 1 patch onto the skin daily. Remove & Discard patch within 12 hours or as directed by MD-apply to lower back area. 08/08/23   Ghimire, Donalda CHRISTELLA, MD  Multiple Vitamins-Minerals (ADULT GUMMY PO) Take 2 tablets by mouth daily.    [provider]  NON FORMULARY Testosterone  20 mg/1ml. Use small pea-sized amount once a week as directed 05/09/23   [provider]  norethindrone -ethinyl estradiol  (FEMHRT  1/5)  1-5 MG-MCG TABS tablet Take 1 tablet by mouth daily.    [provider]  oxyCODONE  (ROXICODONE ) 5 MG immediate release tablet Take 1 tablet (5 mg total) by mouth every 6 (six) hours as needed for up to 5 doses for severe pain (pain score 7-10). 08/07/23   Ghimire, Donalda HERO, MD  pantoprazole  (PROTONIX ) 40 MG tablet TAKE 1 TABLET BY MOUTH 2 TIMES DAILY. 05/08/23   Chandra Toribio POUR, MD  QUEtiapine  (SEROQUEL ) 25 MG tablet Take 1 tablet (25 mg total) by mouth at bedtime. 08/07/23   Ghimire, Donalda HERO, MD  SUMAtriptan  (IMITREX ) 100 MG tablet TAKE 1 TABLET BY MOUTH AS NEEDED FOR MIGRAINE. MAY REPEAT IN 2 HOURS IF HEADACHE PERSISTS OR RECURS. MAX 2  TABLETS PER DAY 08/15/23   Gayle Numbers F, PA-C    Allergies: Doxycycline, Tetracycline hcl, and Codeine    Review of Systems  Constitutional:  Negative for chills and fever.  Eyes:  Negative for visual disturbance.  Gastrointestinal:  Negative for nausea and vomiting.  Neurological:  Positive for headaches. Negative for dizziness, seizures, syncope, weakness and numbness.    Updated Vital Signs BP (!) 164/94   Pulse 82   Temp 98.2 F (36.8 C)   Resp 18   SpO2 100%   Physical Exam Vitals and nursing note reviewed.  Constitutional:      General: She is not in acute distress.    Appearance: Normal appearance. She is well-developed. She is not diaphoretic.  HENT:     Head: Normocephalic.     Comments: Hematoma noted to the occipital region of the scalp without overlying laceration.  No palpable step-off or deformity, negative Battle sign, no hemotympanum or CSF otorrhea Eyes:     General:        Right eye: No discharge.        Left eye: No discharge.     Pupils: Pupils are equal, round, and reactive to light.  Neck:     Comments: Mild C-spine tenderness at the base of the skull without step-off or deformity normal range of motion Cardiovascular:     Rate and Rhythm: Normal rate and regular rhythm.     Pulses: Normal pulses.     Heart sounds: Normal heart sounds.  Pulmonary:     Effort: Pulmonary effort is normal. No respiratory distress.     Breath sounds: Normal breath sounds. No wheezing or rales.     Comments: Respirations equal and unlabored, patient able to speak in full sentences, lungs clear to auscultation bilaterally  Abdominal:     General: Bowel sounds are normal. There is no distension.     Palpations: Abdomen is soft. There is no mass.     Tenderness: There is no abdominal tenderness. There is no guarding.     Comments: Abdomen soft, nondistended, nontender to palpation in all quadrants without guarding or peritoneal signs  Musculoskeletal:        General:  Tenderness present. No deformity.     Cervical back: Neck supple. Tenderness present.     Comments: Mild tenderness over the right ankle without deformity or swelling noted, PT and DP pulses 2+, 5/5 strength with dorsi flexion and plantarflexion.  All other joints supple and easily movable, all compartments soft  Skin:    General: Skin is warm and dry.     Capillary Refill: Capillary refill takes less than 2 seconds.  Neurological:     Mental Status: She is alert and oriented to person, place, and  time.     Coordination: Coordination normal.     Comments: Speech is clear, able to follow commands CN III-XII intact Normal strength in upper and lower extremities bilaterally including dorsiflexion and plantar flexion, strong and equal grip strength Sensation normal to light and sharp touch Moves extremities without ataxia, coordination intact  Psychiatric:        Mood and Affect: Mood normal.        Behavior: Behavior normal.     (all labs ordered are listed, but only abnormal results are displayed) Labs Reviewed - No data to display  EKG: None  Radiology: CT Cervical Spine Wo Contrast Result Date: 09/07/2023 CLINICAL DATA:  Neck trauma (Age >= 65y) EXAM: CT CERVICAL SPINE WITHOUT CONTRAST TECHNIQUE: Multidetector CT imaging of the cervical spine was performed without intravenous contrast. Multiplanar CT image reconstructions were also generated. RADIATION DOSE REDUCTION: This exam was performed according to the departmental dose-optimization program which includes automated exposure control, adjustment of the mA and/or kV according to patient size and/or use of iterative reconstruction technique. COMPARISON:  CT of the cervical spine dated Jul 24, 2023. FINDINGS: Alignment: Interval development of 4 mm of anterolisthesis at C5-6. There is a mild dextrocurvature of the cervical spine. Skull base and vertebrae: No acute fracture is evident. The right facets at C2-3 are fused and the left  facets at C3-4 and C4-5 are fused. No osseous lesions are present. Soft tissues and spinal canal: No paraspinous hematoma or soft tissue swelling present. Bilateral thyroid  nodules, which have been previously assessed and appear unchanged in the interim. Disc levels: There is new degenerative anterolisthesis sat C5-6, but there is no apparent significant spinal canal or neural foraminal stenosis. There is chronic degenerative disc disease at C6-7, also without significant spinal canal or neural foraminal stenosis. Upper chest: Negative. Other: None. IMPRESSION: 1. There is new mild anterolisthesis at C5-6, where there is marked left-sided facet arthrosis. There is no obvious osseous or soft tissue injury, but the soft tissues could be more definitively assessed with MRI, if ligamentous injury is clinically suspected. Flexion and extension lateral radiographs of the cervical spine might also be considered to assess for instability. Electronically Signed   By: Evalene Coho M.D.   On: 09/07/2023 11:45   CT Head Wo Contrast Result Date: 09/07/2023 CLINICAL DATA:  Provided history: Head trauma, minor. EXAM: CT HEAD WITHOUT CONTRAST TECHNIQUE: Contiguous axial images were obtained from the base of the skull through the vertex without intravenous contrast. RADIATION DOSE REDUCTION: This exam was performed according to the departmental dose-optimization program which includes automated exposure control, adjustment of the mA and/or kV according to patient size and/or use of iterative reconstruction technique. COMPARISON:  Head CT 07/24/2023. FINDINGS: Brain: Generalized cerebral atrophy. Patchy and ill-defined hypoattenuation within the cerebral white matter, nonspecific but compatible with mild chronic small vessel ischemic disease. Unchanged subcentimeter hyperdense focus within the left cerebellar hemisphere, suspicious for a cavernoma (when correlating with the prior brain MRI of 06/23/2022). There is no acute  intracranial hemorrhage. No demarcated cortical infarct. No extra-axial fluid collection. No evidence of an intracranial mass. No midline shift. Vascular: No hyperdense vessel. Atherosclerotic calcifications. Skull: No calvarial fracture or aggressive osseous lesion. Sinuses/Orbits: No mass or acute finding within the imaged orbits. No significant paranasal sinus disease at the imaged levels. IMPRESSION: 1.  No evidence of an acute intracranial abnormality. 2. Unchanged subcentimeter hyperdense focus within the left cerebellar hemisphere, suspicious for a cavernoma (when correlating with the prior brain MRI  of 06/23/2022). 3. Parenchymal atrophy and chronic small vessel ischemic disease. Electronically Signed   By: Rockey Childs D.O.   On: 09/07/2023 11:02   DG Ankle Complete Right Result Date: 09/07/2023 CLINICAL DATA:  RIGHT ankle pain EXAM: RIGHT ANKLE - COMPLETE 3+ VIEW COMPARISON:  None Available. FINDINGS: Ankle mortise intact. The talar dome is normal. No malleolar fracture. The calcaneus is normal. IMPRESSION: No fracture or dislocation. Electronically Signed   By: Jackquline Boxer M.D.   On: 09/07/2023 10:07     Procedures   Medications Ordered in the ED  acetaminophen  (TYLENOL ) tablet 650 mg (650 mg Oral Given 09/07/23 1040)                                    Medical Decision Making Amount and/or Complexity of Data Reviewed Radiology: ordered.  Risk OTC drugs.   Patient presents after she had a mechanical fall and struck the back of her head, no laceration but hematoma present, neurologically intact with normal vital signs also with some mild tenderness over the right ankle.  Not on blood thinners.  CT scans of the head and cervical spine obtained, images viewed and interpreted independently, no evidence of acute intracranial abnormality, no cervical spine fracture or malalignment noted, x-rays of the right ankle obtained also without evidence of fracture or dislocation.  Patient  provided with reassurance, discussed symptomatic treatment and return precautions.  Discharged home in good condition.     Final diagnoses:  Fall, initial encounter  Contusion of scalp, initial encounter    ED Discharge Orders     None          Alva Larraine JULIANNA DEVONNA 09/13/23 2048    Ula Prentice SAUNDERS, MD 09/18/23 2253

## 2023-09-07 NOTE — ED Triage Notes (Signed)
 Pt arrives via POV. PT reports she accidentally tripped and fell at home and struck her head on a fireplace. PT denies loc, no blood thinners and is AxOx4. Hematoma noted to back of head. PT denies any other complaints.

## 2023-09-07 NOTE — Discharge Instructions (Signed)
 Your imaging today was reassuring, you can use Tylenol  650 mg every 6 hours as needed for headache or pain.  If you develop visual changes, severe worsening of headache, vomiting or other new or concerning symptoms return for reevaluation.

## 2023-09-12 ENCOUNTER — Telehealth: Payer: Self-pay | Admitting: *Deleted

## 2023-09-12 ENCOUNTER — Ambulatory Visit: Admitting: Podiatry

## 2023-09-12 NOTE — Telephone Encounter (Signed)
 LVM for pt to call office to see about rescheduling an office visit earlier per the message we received, if she calls back please transfer her to the office

## 2023-09-12 NOTE — Telephone Encounter (Signed)
 Copied from CRM [id number]. Topic: Appointments - Scheduling Inquiry for Clinic >> Sep 11, 2023  9:46 AM Treva T wrote: Reason for CRM: Patient calling, requesting to reschedule appointment for 09/13/23 due to schedule conflict.  Appointment was rescheduled, to next available on 11/02/23.   Per patient inquiring if she can be seen sooner for an appointment for follow up.   Patient advised I would send an inquiry to office, for a follow up call.  Patient can be reached at [phone] (home) To discuss further.   Thank you.

## 2023-09-13 ENCOUNTER — Ambulatory Visit

## 2023-09-14 ENCOUNTER — Encounter (HOSPITAL_COMMUNITY): Payer: Self-pay | Admitting: Medical

## 2023-09-14 ENCOUNTER — Telehealth (HOSPITAL_COMMUNITY): Admitting: Medical

## 2023-09-14 DIAGNOSIS — Z62819 Personal history of unspecified abuse in childhood: Secondary | ICD-10-CM

## 2023-09-14 DIAGNOSIS — K709 Alcoholic liver disease, unspecified: Secondary | ICD-10-CM

## 2023-09-14 DIAGNOSIS — F1291 Cannabis use, unspecified, in remission: Secondary | ICD-10-CM

## 2023-09-14 DIAGNOSIS — E8889 Other specified metabolic disorders: Secondary | ICD-10-CM

## 2023-09-14 DIAGNOSIS — Z9889 Other specified postprocedural states: Secondary | ICD-10-CM

## 2023-09-14 DIAGNOSIS — Z79899 Other long term (current) drug therapy: Secondary | ICD-10-CM

## 2023-09-14 DIAGNOSIS — Z8669 Personal history of other diseases of the nervous system and sense organs: Secondary | ICD-10-CM

## 2023-09-14 DIAGNOSIS — Z8659 Personal history of other mental and behavioral disorders: Secondary | ICD-10-CM

## 2023-09-14 DIAGNOSIS — Z8781 Personal history of (healed) traumatic fracture: Secondary | ICD-10-CM

## 2023-09-14 DIAGNOSIS — R296 Repeated falls: Secondary | ICD-10-CM

## 2023-09-14 DIAGNOSIS — F1020 Alcohol dependence, uncomplicated: Secondary | ICD-10-CM

## 2023-09-14 DIAGNOSIS — I10 Essential (primary) hypertension: Secondary | ICD-10-CM

## 2023-09-14 DIAGNOSIS — F4312 Post-traumatic stress disorder, chronic: Secondary | ICD-10-CM

## 2023-09-14 DIAGNOSIS — L718 Other rosacea: Secondary | ICD-10-CM

## 2023-09-14 MED ORDER — PROPRANOLOL HCL 10 MG PO TABS: ORAL_TABLET | ORAL | 0 refills | Status: DC

## 2023-09-14 MED ORDER — BUPROPION HCL ER (XL) 150 MG PO TB24: 150.0000 mg | ORAL_TABLET | ORAL | 1 refills | Status: AC

## 2023-09-14 MED ORDER — GABAPENTIN 300 MG PO CAPS: 300.0000 mg | ORAL_CAPSULE | Freq: Two times a day (BID) | ORAL | 2 refills | Status: DC

## 2023-09-14 MED ORDER — BACLOFEN 10 MG PO TABS: 10.0000 mg | ORAL_TABLET | Freq: Three times a day (TID) | ORAL | 1 refills | Status: AC

## 2023-09-14 NOTE — Progress Notes (Signed)
 BH MD/PA/NP OP Progress Note  09/14/2023 4:20 PM Terri [facility]ala  MRN:  [id number] Virtual Visit via Video Note  I connected with Terri Ayala on 09/14/23 at  1:30 PM EDT by a video enabled telemedicine application and verified that I am speaking with the correct person using two identifiers.  Location: Patient: Home Provider: [facility]   I discussed the limitations of evaluation and management by telemedicine and the availability of in person appointments. The patient expressed understanding and agreed to proceed.   History of Present Illness:See EPIC note    Observations/Objective:See EPIC note   Assessment and Plan:See EPIC note   Follow Up Instructions:See EPIC note    I discussed the assessment and treatment plan with the patient. The patient was provided an opportunity to ask questions and all were answered. The patient agreed with the plan and demonstrated an understanding of the instructions.   The patient was advised to call back or seek an in-person evaluation if the symptoms worsen or if the condition fails to improve as anticipated.  I provided 45 minutes of non-face-to-face time during this encounter.   Terri Emmer, PA-C   Chief Complaint:  Chief Complaint  Patient presents with   Follow-up   Alcohol  Problem   Trauma   Stress   Anxiety   Depression   Fall   HPI: Terri [facility]ala is finally seen after multiple attempts for FU failed to reach her.She was last seen on 05/25/2023  05/25/2023  important  Sensitive  Relapse into alcoholic drinkin HPI; Pt is seen Virtually having failed to FU from appointment 02/16/2023:  On 05/01/2023 patient saw PCP and admiited return to use. Review of EPIC revealed 2 phone calls to Terri [facility]ala CDIOP Counselor 1/08 and 03/17/2023 requesting return to IOP but she did not return his calls PCP contacted this provider who advised Detox and referral to Counselor [facility].  Today Terri Ayala reports her last drink was last nite.When asked what  she wanted to do at this point she was willing to do short term inpt but not residential treatment.sHE SAYS tHE HOLIDAYS GOT ME DOWN  Assessment: Alcohol  Use Disorder Severe Dependence-relapse requires medically supervised withdrawal   and Plan:  Pt agrees to contact PCP for admission to [facility] detox unit Pt to have [facility] contact [facility] IOP Counselor for consideration of aftercare/return to [facility] IOP   Terri Emmer, PA-C    Encounter Date: 06/15/2023 Patient ID: Terri Ayala, female   DOB: Nov 25, 1955, 68 y.o.   MRN: [id number] Attempted to reach patient by phone after no response to My Chart video appointment invitation for FU on last visit regarding relapse into alcohol . Reviewed chart but could find no updates regarding discussion of Detox at that visit. Left message for her to call [phone] and schedule FU          06/29/2023 My Chart visit NO SHOW 2 invites sent  07/13/2023 No show  Today Terri [facility]ala is seen for FU for Alcohol  SUD dependence relapse 04/2023 (says she last drank 2&1/2 mos ago but she was hospitalized in June for ETOH withdrawal and a fall); for medication management following discharged from [facility] 12/15/2021 ;co occurring PTSD with sequella of Chronic anxious depression. She missed her last appointment due to what she says was a non alcohol  related fall. No tox study done. Discharge summary contradicts her recollection. Terri [facility]ala  [facility] Provider Note  CSN: [id number] Arrival date & time: 09/07/23  9074  Patient presents with: Terri Ayala is a 68 y.o. female with a history of thyroid  disease, hypertension, hyperlipidemia, SVT, GERD, who presents to the ED for evaluation after mechanical fall.  Patient reports she typically uses a walker and the leg of her walker got hung up on her bedside commode causing her to trip and she fell backwards striking the back of her head  on the brick fireplace.  No loss of consciousness and no bleeding or laceration reported but patient does report headache.  No visual changes, nausea or vomiting, no numbness or weakness.  Patient does report feeling mildly dizzy immediately after the fall.  Also reports some mild pain in her ankle since the fall, does not know what she hit it on.  Denies neck or back pain.  No other aggravating or alleviating factors.  Not on blood thinners.   She is having hip pain for which she got an MRI at freestanding site and wont have results for 2 weeks she says..She reports she is in a wheelchair and mentions ? THR  In addition to her hip pain she c/o increased anxiety usually at mid day and trouble sleeping at times awakening in the middle of the nite. She was prescribed Seroquel  25 mg at [facility] but says she never got this rx. EPIC MEDICATION Review was performed  Past Psychiatric History:  Drug Induced Encephalopathy  Admit date: 02/15/2022 Discharge date:   02/25/2022 Discharge Summary   Brief Summary: Admit Date: 07/24/2023 Discharge date: 08/07/2023 Patient is a 68 y.o.  female with history of EtOH use-presented with fall resulting in rib fracture-[facility] course complicated by alcohol  withdrawal symptoms   Multiple visit to EDs [facility] 2017   [facility] CD[facility] Date of Admission: 08/18/2021 Date of Discharge: 10/19-20/2023   Past Medical History:  [facility] Arrival date & time: 02/06/23  1225 Final Clinical Impressions(s) / UC Diagnoses   Final diagnoses:  Contusion of coccyx, initial encounter  Traumatic coccydynia  Lumbar back pain  Fall, initial encounter        Past Medical History:  Diagnosis Date   Alcohol  withdrawal (HCC) 11/2017; 01/04/2018   Alcoholism (HCC)     Anxiety     Arthritis      hands (01/04/2018)   ASYMPTOMATIC POSTMENOPAUSAL STATUS 08/17/2009   Chronic lower back pain     Depression     DYSPHAGIA 06/19/2007   Fatty liver, alcoholic      GERD (gastroesophageal reflux disease)     GOITER, MULTINODULAR 08/17/2009   History of colonic polyps 03/25/2020   Hyperlipidemia     Hypertension     Migraines      not sure what triggers them; I'll have 1 q couple weeks or more; come 2 days in a row when they come (01/04/2018)   Mitral valve prolapse     OSTEOPOROSIS 08/17/2009   PONV (postoperative nausea and vomiting)     Supraventricular tachycardia (HCC)               Past Surgical History:  Procedure Laterality Date   BREAST BIOPSY Right      benign   CATARACT EXTRACTION W/ INTRAOCULAR LENS  IMPLANT, BILATERAL       CERVICAL CONE BIOPSY   2000s   CERVIX LESION DESTRUCTION   1991    dysplasia; lesions   CHOLECYSTECTOMY N/A  03/14/2013    Procedure: LAPAROSCOPIC CHOLECYSTECTOMY WITH ATTEMPTED INTRAOPERATIVE CHOLANGIOGRAM;  Surgeon: Vicenta DELENA Poli, MD;  Location: [facility] OR;  Service: General;  Laterality: N/A;   HARDWARE REMOVAL Left 07/22/2021    Procedure: HARDWARE REMOVAL;  Surgeon: Celena Sharper, MD;  Location: [facility] OR;  Service: Orthopedics;  Laterality: Left;   laporoscopic abdominal surgery        for endometriosis   LEFT HEART CATH AND CORONARY ANGIOGRAPHY N/A 05/07/2018    Procedure: LEFT HEART CATH AND CORONARY ANGIOGRAPHY;  Surgeon: Claudene Victory ORN, MD;  Location: [facility] INVASIVE CV LAB;  Service: Cardiovascular;  Laterality: N/A;   ORIF ANKLE FRACTURE Left 02/11/2021    Procedure: OPEN REDUCTION INTERNAL FIXATION (ORIF) ANKLE FRACTURE;  Surgeon: Celena Sharper, MD;  Location: [facility] OR;  Service: Orthopedics;  Laterality: Left;   TONSILLECTOMY            Family Psychiatric History:  Maternal uncle alcoholic    Family History:           Family History  Problem Relation Age of Onset   Colon cancer Father          Deceased, 53   Osteoporosis Mother          Living, 37   Healthy Brother     Healthy Brother     Healthy Son     Healthy Son     Thyroid  disease Neg Hx     Goiter Neg Hx             Social  History:  Social History    Socioeconomic History              Marital status: Single      Spouse name: NA   Number of children: None   Years of education: 16   Highest education level: BS Biology degree  Occupational History   Occupation: Hydrographic surveyor - retired      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[employer]  Tobacco Use   Smoking status: Former      Packs/day: 1.00      Years: 7.00      Pack years: 7.00      Types: Cigarettes      Quit date: 02/28/1974      Years since quitting: 47.3   Smokeless tobacco: Never   Tobacco comments:      01/04/2018 smoked when I was a teenager  Vaping Use   Vaping Use: Never used  Substance and Sexual Activity   Alcohol  use: Yes      Alcohol /week: 1-3 bottles of wine daily      Types: Wine      Comment: Detox 06/2021 CD IOP 6/23-8/25/2023 & 08/07/2023   Drug use: 2023/2024 CBD Gummies resulting in Metabolic encephalitis   Sexual activity: Not Currently  Other Topics Concern   Mr. Chandell Attridge who has POA. (Brother)  Social History Narrative    Lives alone.  She has a BS biology.    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        Social Drivers of Acupuncturist Strain: Low Risk  (01/22/2023)    Overall Financial Resource Strain (CARDIA)     Difficulty of Paying Living Expenses: Not very hard  Recent Concern: Financial Resource Strain - Medium Risk (12/27/2022)    Overall Financial Resource Strain (CARDIA)     Difficulty of Paying Living Expenses: Somewhat hard  Food Insecurity: No Food  Insecurity (01/23/2023)    Hunger Vital Sign     Worried About Running Out of Food in the Last Year: Never true     Ran Out of Food in the Last Year: Never true  Transportation Needs: No Transportation Needs (01/23/2023)    PRAPARE - Therapist, art (Medical): No     Lack of Transportation (Non-Medical): No  Recent Concern: Transportation Needs - Unmet Transportation Needs (12/27/2022)     PRAPARE - Transportation     Lack of Transportation (Medical): Yes     Lack of Transportation (Non-Medical): Yes  Physical Activity: Insufficiently Active (01/22/2023)    Exercise Vital Sign     Days of Exercise per Week: 2 days     Minutes of Exercise per Session: 20 min  Stress: No Stress Concern Present (01/22/2023)    [employer] of Occupational Health - Occupational Stress Questionnaire     Feeling of Stress : Only a little  Social Connections: Socially Isolated (01/23/2023)    Social Connection and Isolation Panel [NHANES]     Frequency of Communication with Friends and Family: Once a week     Frequency of Social Gatherings with Friends and Family: Once a week     Attends Religious Services: 1 to 4 times per year     Active Member of [employer] or Organizations: No     Attends Banker Meetings: Never     Marital Status: Never married        Allergies:  Allergies           Allergies  Allergen Reactions   Doxycycline Swelling      Mouth swelling and sores   Other Reaction(s): Not available   Codeine Itching      Other Reaction(s): Not available   Tetracycline Hcl Swelling      Mouth swelling and sores       Therapeutic Level Labs:NA   Metabolic Disorder Labs: Recent Labs       Lab Results  Component Value Date    HGBA1C 4.7 (L) 11/21/2020    MPG 104 08/02/2023    MPG 97 03/19/2013      Recent Labs  No results found for: PROLACTIN   Recent Labs       Lab Results  Component Value Date    CHOL 193 04/22/2022    TRIG 104 04/22/2022    HDL 49 04/22/2022    CHOLHDL 3.9 04/22/2022    VLDL 10 10/04/2012    LDLCALC 125 (H) 04/22/2022    LDLCALC 168 (H) 12/15/2021      Recent Labs       Lab Results  Component Value Date    TSH  6.915 High   07/24/23 3.018 CM       02/16/22 1.753 CM        01/26/22 2.580 R          12/15/21 3.332 CM          06/08/21 3.348 CM         02/11/21      1.405 CM                9 /223/22                           Therapeutic Level Labs:NA  Current Medications: Current Outpatient Medications  Medication Sig Dispense Refill   alendronate  (FOSAMAX ) 70 MG tablet Take 70  mg by mouth every Monday.     busPIRone  (BUSPAR ) 15 MG tablet Take 1 tablet (15 mg total) by mouth 3 (three) times daily. 20 tablet 0   Calcium  Carbonate-Vitamin D  (CALCIUM  600+D PO) Take 1 tablet by mouth daily.     carvedilol  (COREG ) 6.25 MG tablet TAKE 1 TABLET BY MOUTH 2 TIMES DAILY WITH A MEAL. 180 tablet 1   fluticasone  (FLONASE ) 50 MCG/ACT nasal spray Place 2 sprays into both nostrils daily as needed for allergies.     folic acid  (FOLVITE ) 1 MG tablet TAKE 1 TABLET BY MOUTH DAILY. 30 tablet 3   Multiple Vitamins-Minerals (ADULT GUMMY PO) Take 2 tablets by mouth daily.     NON FORMULARY Testosterone  20 mg/28ml. Use small pea-sized amount once a week as directed     norethindrone -ethinyl estradiol  (FEMHRT  1/5) 1-5 MG-MCG TABS tablet Take 1 tablet by mouth daily.     pantoprazole  (PROTONIX ) 40 MG tablet TAKE 1 TABLET BY MOUTH 2 TIMES DAILY. 180 tablet 1   SUMAtriptan  (IMITREX ) 100 MG tablet TAKE 1 TABLET BY MOUTH AS NEEDED FOR MIGRAINE. MAY REPEAT IN 2 HOURS IF HEADACHE PERSISTS OR RECURS. MAX 2 TABLETS PER DAY 15 tablet 1   baclofen  (LIORESAL ) 10 MG tablet Take 1 tablet (10 mg total) by mouth 3 (three) times daily. 270 tablet 1   buPROPion  (WELLBUTRIN  XL) 150 MG 24 hr tablet Take 1 tablet (150 mg total) by mouth every morning. 90 tablet 1   DULoxetine  (CYMBALTA ) 60 MG capsule Take 60 mg by mouth 2 (two) times daily. (Patient not taking: Reported on 09/14/2023)     feeding supplement (ENSURE PLUS HIGH PROTEIN) LIQD Take 237 mLs by mouth 3 (three) times daily between meals. (Patient not taking: Reported on 09/14/2023)     gabapentin  (NEURONTIN ) 300 MG capsule Take 1 capsule (300 mg total) by mouth 2 (two) times daily. 60 capsule 2   hydrOXYzine  (ATARAX ) 25 MG tablet Take 25 mg by mouth 3 (three) times daily.     lidocaine  (LIDODERM ) 5 %  Place 1 patch onto the skin daily. Remove & Discard patch within 12 hours or as directed by MD.  Apply to left flank area. (Patient not taking: Reported on 09/14/2023)     lidocaine  (LIDODERM ) 5 % Place 1 patch onto the skin daily. Remove & Discard patch within 12 hours or as directed by MD-apply to lower back area. (Patient not taking: Reported on 09/14/2023)     oxyCODONE  (ROXICODONE ) 5 MG immediate release tablet Take 1 tablet (5 mg total) by mouth every 6 (six) hours as needed for up to 5 doses for severe pain (pain score 7-10). (Patient not taking: Reported on 09/14/2023) 20 tablet 0   propranolol  (INDERAL ) 10 MG tablet Take 1-2 tablets at onset of panic no more than 2 times daily 180 tablet 0   QUEtiapine  (SEROQUEL ) 25 MG tablet Take 1 tablet (25 mg total) by mouth at bedtime. (Patient not taking: Reported on 09/14/2023)     No current facility-administered medications for this visit.    Musculoskeletal: Strength & Muscle Tone: Telepsych visit-Pt reports ashe is in wheelchair Gait & Station: NA Patient leans: N/A  Psychiatric Specialty Exam: Review of Systems  Constitutional:  Positive for activity change. Negative for appetite change, chills, diaphoresis, fatigue, fever and unexpected weight change.  HENT:  Negative for congestion, postnasal drip, rhinorrhea, sinus pressure, sinus pain, sneezing, sore throat, tinnitus, trouble swallowing and voice change.   Eyes: Negative.   Respiratory:  Negative  for apnea, cough, choking, chest tightness, shortness of breath, wheezing and stridor.   Cardiovascular:  Negative for chest pain, palpitations and leg swelling.       Hypertensive  Gastrointestinal:  Negative for abdominal distention, abdominal pain, anal bleeding, blood in stool, constipation, diarrhea, nausea, rectal pain and vomiting.  Endocrine: Negative for cold intolerance, heat intolerance, polydipsia, polyphagia and polyuria.  Genitourinary:  Negative for decreased urine volume,  difficulty urinating, dyspareunia, dysuria, enuresis, flank pain, frequency, genital sores, hematuria, menstrual problem, pelvic pain, urgency, vaginal bleeding, vaginal discharge and vaginal pain.  Musculoskeletal:  Positive for arthralgias and gait problem. Negative for back pain, joint swelling, myalgias, neck pain and neck stiffness.       Hip pain per HPI  Skin:  Positive for color change and rash. Negative for pallor and wound.       Rosacea  Allergic/Immunologic: Positive for environmental allergies. Negative for food allergies and immunocompromised state.  Neurological:  Positive for dizziness (?ETOH +medication). Negative for tremors, seizures, syncope, facial asymmetry, speech difficulty, weakness, light-headedness, numbness and headaches.  Psychiatric/Behavioral:  Positive for behavioral problems (Relapsed), dysphoric mood, self-injury (Keeps drinking and falling) and sleep disturbance (per HPI). Negative for agitation, confusion, decreased concentration, hallucinations and suicidal ideas. The patient is not nervous/anxious and is not hyperactive.   All other systems reviewed and are negative.   There is no height or weight on file to calculate BMI.MY CHART Updated Vital Signs ED 09/07/2023 BP (!) 164/94   Pulse 82   Temp 98.2 F (36.8 C)   Resp 18   SpO2 100%  General Appearance: Disheveled  Eye Contact:  Minimal  Speech:  Clear and Coherent and Slow  Volume:  Normal  Mood:  Anxious  Affect:  Does not face camera and images are blurry but grossly normal  Thought Process:  Coherent, Goal Directed, and Descriptions of Associations: Intact  Orientation:  Full (Time, Place, and Person)  Thought Content: WDL, Logical, and Slow delayed responses   Suicidal Thoughts:  No  Homicidal Thoughts:  No  Memory:  Immediate;   Fair Recent;   Poor Remote;   Fair  Judgement:  Impaired  Insight:  Lacking  Psychomotor Activity:  In wheelchair  Concentration:  Concentration: Fair and  Attention Span: Fair  Recall:  [employer] of Knowledge: WDL  Language: Good  Akathisia:  NA  Handed:  Right  AIMS (if indicated): NA  Assets:  Desire for Improvement Financial Resources/Insurance Housing Resilience Social Support Talents/Skills Vocational/Educational  ADL's:  Impaired  Cognition: Impaired,  Moderate Alcohol ?  Sleep:  Fair   Screenings: AUDIT    Flowsheet Row [facility]  Alcohol  Use Disorder Identification Test Final Score (AUDIT) 18 21   GAD-7    Flowsheet Row Office Visit from 05/01/2023 in [facility] at [facility] Visit from 12/28/2022 in [facility] at [facility] Visit from 10/17/2022 in [facility] at [facility] Visit from 08/23/2022 in [facility] at [facility] Clinical Support from 12/07/2021 in [facility] at [facility]  Total GAD-7 Score 0 2 4 1  0   PHQ2-9    Flowsheet Row Office Visit from 05/01/2023 in [facility] at [facility] Clinical Support from 01/23/2023 in [facility] at [facility] Visit from 12/28/2022 in [facility] Visit from 10/17/2022 in [facility] at [facility] Visit from 08/23/2022 in [facility] at [facility]  PHQ-2 Total Score 0 0 0 1 0  PHQ-9 Total Score 0 0 4 5 2    Flowsheet Row ED from 09/07/2023 in [facility] at [facility] ED to Hosp-Admission (Discharged) from 07/24/2023 in [facility] ED from 02/06/2023 in [facility]  C-SSRS RISK CATEGORY No Risk No Risk No Risk     Assessment ;Fear she continues to drink and fall    and Plan: Refills given with admonition about avoiding falls FU 1  month in person if possible Consider further treatment for ASUD. Send note to PCP     Terri Emmer, PA-C 09/14/2023, 4:20 PM

## 2023-09-18 ENCOUNTER — Ambulatory Visit: Admitting: Podiatry

## 2023-09-22 ENCOUNTER — Ambulatory Visit: Admitting: Podiatry

## 2023-09-22 DIAGNOSIS — Z91199 Patient's noncompliance with other medical treatment and regimen due to unspecified reason: Secondary | ICD-10-CM

## 2023-09-22 NOTE — Progress Notes (Signed)
 1. No-show for appointment

## 2023-09-28 ENCOUNTER — Inpatient Hospital Stay (HOSPITAL_COMMUNITY)
Admission: EM | Admit: 2023-09-28 | Discharge: 2023-10-02 | DRG: 897 | Disposition: A | Discharge status: Home / Self Care | Attending: Internal Medicine | Admitting: Internal Medicine

## 2023-09-28 ENCOUNTER — Other Ambulatory Visit: Payer: Self-pay

## 2023-09-28 ENCOUNTER — Emergency Department (HOSPITAL_COMMUNITY)

## 2023-09-28 ENCOUNTER — Encounter (HOSPITAL_COMMUNITY): Payer: Self-pay

## 2023-09-28 DIAGNOSIS — I10 Essential (primary) hypertension: Secondary | ICD-10-CM | POA: Diagnosis present

## 2023-09-28 DIAGNOSIS — K219 Gastro-esophageal reflux disease without esophagitis: Secondary | ICD-10-CM | POA: Diagnosis present

## 2023-09-28 DIAGNOSIS — M810 Age-related osteoporosis without current pathological fracture: Secondary | ICD-10-CM | POA: Diagnosis present

## 2023-09-28 DIAGNOSIS — Z87891 Personal history of nicotine dependence: Secondary | ICD-10-CM

## 2023-09-28 DIAGNOSIS — Z961 Presence of intraocular lens: Secondary | ICD-10-CM | POA: Diagnosis present

## 2023-09-28 DIAGNOSIS — Z79899 Other long term (current) drug therapy: Secondary | ICD-10-CM | POA: Diagnosis not present

## 2023-09-28 DIAGNOSIS — R251 Tremor, unspecified: Secondary | ICD-10-CM | POA: Diagnosis present

## 2023-09-28 DIAGNOSIS — Z9841 Cataract extraction status, right eye: Secondary | ICD-10-CM

## 2023-09-28 DIAGNOSIS — Z860100 Personal history of colon polyps, unspecified: Secondary | ICD-10-CM | POA: Diagnosis not present

## 2023-09-28 DIAGNOSIS — E785 Hyperlipidemia, unspecified: Secondary | ICD-10-CM | POA: Diagnosis present

## 2023-09-28 DIAGNOSIS — F10139 Alcohol abuse with withdrawal, unspecified: Secondary | ICD-10-CM | POA: Diagnosis not present

## 2023-09-28 DIAGNOSIS — F10239 Alcohol dependence with withdrawal, unspecified: Secondary | ICD-10-CM | POA: Diagnosis present

## 2023-09-28 DIAGNOSIS — F39 Unspecified mood [affective] disorder: Secondary | ICD-10-CM | POA: Diagnosis present

## 2023-09-28 DIAGNOSIS — I341 Nonrheumatic mitral (valve) prolapse: Secondary | ICD-10-CM | POA: Diagnosis present

## 2023-09-28 DIAGNOSIS — F419 Anxiety disorder, unspecified: Secondary | ICD-10-CM | POA: Diagnosis present

## 2023-09-28 DIAGNOSIS — Z9049 Acquired absence of other specified parts of digestive tract: Secondary | ICD-10-CM

## 2023-09-28 DIAGNOSIS — Z885 Allergy status to narcotic agent status: Secondary | ICD-10-CM

## 2023-09-28 DIAGNOSIS — F32A Depression, unspecified: Secondary | ICD-10-CM | POA: Diagnosis present

## 2023-09-28 DIAGNOSIS — F10930 Alcohol use, unspecified with withdrawal, uncomplicated: Secondary | ICD-10-CM | POA: Diagnosis not present

## 2023-09-28 DIAGNOSIS — E8721 Acute metabolic acidosis: Secondary | ICD-10-CM | POA: Diagnosis present

## 2023-09-28 DIAGNOSIS — Z888 Allergy status to other drugs, medicaments and biological substances status: Secondary | ICD-10-CM | POA: Diagnosis not present

## 2023-09-28 DIAGNOSIS — F10939 Alcohol use, unspecified with withdrawal, unspecified: Secondary | ICD-10-CM | POA: Diagnosis present

## 2023-09-28 DIAGNOSIS — G629 Polyneuropathy, unspecified: Secondary | ICD-10-CM | POA: Diagnosis present

## 2023-09-28 DIAGNOSIS — Z881 Allergy status to other antibiotic agents status: Secondary | ICD-10-CM | POA: Diagnosis not present

## 2023-09-28 DIAGNOSIS — Z9842 Cataract extraction status, left eye: Secondary | ICD-10-CM

## 2023-09-28 DIAGNOSIS — Z7983 Long term (current) use of bisphosphonates: Secondary | ICD-10-CM

## 2023-09-28 LAB — CBC WITH DIFFERENTIAL/PLATELET
Abs Immature Granulocytes: 0.04 K/uL (ref 0.00–0.07)
Basophils Absolute: 0.1 K/uL (ref 0.0–0.1)
Basophils Relative: 1 %
Eosinophils Absolute: 0.1 K/uL (ref 0.0–0.5)
Eosinophils Relative: 1 %
HCT: 49.3 % — ABNORMAL HIGH (ref 36.0–46.0)
Hemoglobin: 15.9 g/dL — ABNORMAL HIGH (ref 12.0–15.0)
Immature Granulocytes: 1 %
Lymphocytes Relative: 23 %
Lymphs Abs: 1.9 K/uL (ref 0.7–4.0)
MCH: 29.9 pg (ref 26.0–34.0)
MCHC: 32.3 g/dL (ref 30.0–36.0)
MCV: 92.7 fL (ref 80.0–100.0)
Monocytes Absolute: 0.7 K/uL (ref 0.1–1.0)
Monocytes Relative: 8 %
Neutro Abs: 5.3 K/uL (ref 1.7–7.7)
Neutrophils Relative %: 66 %
Platelets: UNDETERMINED K/uL (ref 150–400)
RBC: 5.32 MIL/uL — ABNORMAL HIGH (ref 3.87–5.11)
RDW: 14.3 % (ref 11.5–15.5)
WBC: 8 K/uL (ref 4.0–10.5)
nRBC: 0 % (ref 0.0–0.2)

## 2023-09-28 LAB — COMPREHENSIVE METABOLIC PANEL WITH GFR
ALT: 12 U/L (ref 0–44)
AST: 29 U/L (ref 15–41)
Albumin: 3.9 g/dL (ref 3.5–5.0)
Alkaline Phosphatase: 94 U/L (ref 38–126)
Anion gap: 24 — ABNORMAL HIGH (ref 5–15)
BUN: 10 mg/dL (ref 8–23)
CO2: 10 mmol/L — ABNORMAL LOW (ref 22–32)
Calcium: 9 mg/dL (ref 8.9–10.3)
Chloride: 113 mmol/L — ABNORMAL HIGH (ref 98–111)
Creatinine, Ser: 0.72 mg/dL (ref 0.44–1.00)
GFR, Estimated: >60 mL/min (ref >60)
Glucose, Bld: 84 mg/dL (ref 70–99)
Potassium: 4.4 mmol/L (ref 3.5–5.1)
Sodium: 147 mmol/L — ABNORMAL HIGH (ref 135–145)
Total Bilirubin: 1.1 mg/dL (ref 0.0–1.2)
Total Protein: 7.3 g/dL (ref 6.5–8.1)

## 2023-09-28 LAB — MAGNESIUM: Magnesium: 2.1 mg/dL (ref 1.7–2.4)

## 2023-09-28 LAB — TROPONIN I (HIGH SENSITIVITY)
Troponin I (High Sensitivity): 16 ng/L (ref <18)
Troponin I (High Sensitivity): 16 ng/L (ref <18)

## 2023-09-28 LAB — D-DIMER, QUANTITATIVE: D-Dimer, Quant: 0.39 ug{FEU}/mL (ref 0.00–0.50)

## 2023-09-28 MED ORDER — FOLIC ACID 1 MG PO TABS
1.0000 mg | ORAL_TABLET | Freq: Every day | ORAL | Status: DC
  Administered 2023-09-29 – 2023-10-02 (×4): 1 mg via ORAL
  Filled 2023-09-28 (×4): qty 1

## 2023-09-28 MED ORDER — ENOXAPARIN SODIUM 40 MG/0.4ML IJ SOSY
40.0000 mg | PREFILLED_SYRINGE | INTRAMUSCULAR | Status: DC
  Administered 2023-09-28 – 2023-09-30 (×3): 40 mg via SUBCUTANEOUS
  Filled 2023-09-28 (×3): qty 0.4

## 2023-09-28 MED ORDER — THIAMINE MONONITRATE 100 MG PO TABS: 100.0000 mg | ORAL_TABLET | Freq: Every day | ORAL | Status: DC

## 2023-09-28 MED ORDER — LORAZEPAM 1 MG PO TABS
1.0000 mg | ORAL_TABLET | ORAL | Status: AC | PRN
  Administered 2023-09-28: 2 mg via ORAL
  Administered 2023-09-28: 1 mg via ORAL
  Administered 2023-10-01: 2 mg via ORAL
  Filled 2023-09-28: qty 2
  Filled 2023-09-28 (×2): qty 1
  Filled 2023-09-28: qty 2

## 2023-09-28 MED ORDER — CARVEDILOL 6.25 MG PO TABS
6.2500 mg | ORAL_TABLET | Freq: Two times a day (BID) | ORAL | Status: DC
  Administered 2023-09-28 – 2023-10-02 (×8): 6.25 mg via ORAL
  Filled 2023-09-28 (×3): qty 1
  Filled 2023-09-28 (×2): qty 2
  Filled 2023-09-28: qty 1
  Filled 2023-09-28: qty 2
  Filled 2023-09-28: qty 1

## 2023-09-28 MED ORDER — THIAMINE HCL 100 MG/ML IJ SOLN: 100.0000 mg | Freq: Every day | INTRAMUSCULAR | Status: DC

## 2023-09-28 MED ORDER — ADULT MULTIVITAMIN W/MINERALS CH
1.0000 | ORAL_TABLET | Freq: Every day | ORAL | Status: DC
  Administered 2023-09-29 – 2023-10-02 (×4): 1 via ORAL
  Filled 2023-09-28 (×5): qty 1

## 2023-09-28 MED ORDER — SODIUM CHLORIDE 0.9 % IV SOLN: INTRAVENOUS | Status: DC

## 2023-09-28 MED ORDER — LACTATED RINGERS IV BOLUS
500.0000 mL | Freq: Once | INTRAVENOUS | Status: AC
  Administered 2023-09-28: 500 mL via INTRAVENOUS

## 2023-09-28 MED ORDER — ADULT MULTIVITAMIN W/MINERALS CH
1.0000 | ORAL_TABLET | Freq: Once | ORAL | Status: AC
  Administered 2023-09-28: 1 via ORAL
  Filled 2023-09-28: qty 1

## 2023-09-28 MED ORDER — FOLIC ACID 1 MG PO TABS
1.0000 mg | ORAL_TABLET | Freq: Every day | ORAL | Status: DC
Start: 2023-09-28 — End: 2023-09-28

## 2023-09-28 MED ORDER — LORAZEPAM 2 MG/ML IJ SOLN
1.0000 mg | INTRAMUSCULAR | Status: AC | PRN
  Administered 2023-09-30: 2 mg via INTRAVENOUS
  Filled 2023-09-28: qty 1

## 2023-09-28 MED ORDER — PHENOBARBITAL SODIUM 130 MG/ML IJ SOLN
130.0000 mg | Freq: Once | INTRAMUSCULAR | Status: AC
  Administered 2023-09-28: 130 mg via INTRAVENOUS
  Filled 2023-09-28: qty 1

## 2023-09-28 MED ORDER — FOLIC ACID 1 MG PO TABS
1.0000 mg | ORAL_TABLET | Freq: Once | ORAL | Status: AC
  Administered 2023-09-28: 1 mg via ORAL
  Filled 2023-09-28: qty 1

## 2023-09-28 MED ORDER — THIAMINE HCL 100 MG/ML IJ SOLN
100.0000 mg | Freq: Every day | INTRAMUSCULAR | Status: DC
  Filled 2023-09-28: qty 2

## 2023-09-28 MED ORDER — PANTOPRAZOLE SODIUM 40 MG PO TBEC
40.0000 mg | DELAYED_RELEASE_TABLET | Freq: Two times a day (BID) | ORAL | Status: DC
  Administered 2023-09-28 – 2023-10-02 (×8): 40 mg via ORAL
  Filled 2023-09-28 (×8): qty 1

## 2023-09-28 MED ORDER — THIAMINE HCL 100 MG PO TABS
100.0000 mg | ORAL_TABLET | Freq: Once | ORAL | Status: AC
  Administered 2023-09-28: 100 mg via ORAL
  Filled 2023-09-28 (×2): qty 1

## 2023-09-28 MED ORDER — BUPROPION HCL ER (XL) 150 MG PO TB24
150.0000 mg | ORAL_TABLET | Freq: Every day | ORAL | Status: DC
  Administered 2023-09-29 – 2023-10-02 (×4): 150 mg via ORAL
  Filled 2023-09-28 (×4): qty 1

## 2023-09-28 MED ORDER — THIAMINE MONONITRATE 100 MG PO TABS
100.0000 mg | ORAL_TABLET | Freq: Every day | ORAL | Status: DC
  Administered 2023-09-29 – 2023-10-02 (×4): 100 mg via ORAL
  Filled 2023-09-28 (×4): qty 1

## 2023-09-28 NOTE — H&P (Signed)
 History and Physical    Patient: Terri Ayala FMW:[id number] DOB: Dec 23, 1955 DOA: 09/28/2023 DOS: the patient was seen and examined on 09/28/2023 PCP: Gayle Saddie FALCON, PA-C  Patient coming from: Home  Chief Complaint:  Chief Complaint  Patient presents with   Chest Pain   HPI: Terri Ayala is a 68 y.o. female with medical history significant of alcohol  use disorder, hypertension, hyperlipidemia, GERD, peripheral neuropathy p/w alcohol  withdrawal.  Pt unable to provide medical history. From what I can gather from telephone conversation with EDP and brief pt interview, Patient states that her last drink of alcohol  was approximately 5 to 6 PM last night. Patient states since then she has been experiencing muscle aches, tremors and this morning began experiencing substernal dull chest pain without radiation. Patient denies associated shortness of breath however does endorse 1 episode of nonbloody nonbilious emesis this a.m. Patient denies hematemesis, hematochezia, melena .  Of note, pt was recently admitted in 07/2023 with alcohol  withdrawal and encephalopathy at that time.    In the ED, pt tachycardic and tachypneic on RA c/w withdrawal. Labs notable for Na 147, and anion gap 24. CXR with active disease. EDP administered phenobarbital  x2 in ED and pt with ongoing withdrawal/tachycardia; thus, medicine admission to PCU requested.   Review of Systems: As mentioned in the history of present illness. All other systems reviewed and are negative. Past Medical History:  Diagnosis Date   Alcohol  withdrawal (HCC) 11/2017; 01/04/2018   Alcoholism (HCC)    Anxiety    Arthritis    hands (01/04/2018)   ASYMPTOMATIC POSTMENOPAUSAL STATUS 08/17/2009   Chronic lower back pain    Depression    DYSPHAGIA 06/19/2007   Fatty liver, alcoholic    GERD (gastroesophageal reflux disease)    GOITER, MULTINODULAR 08/17/2009   History of colonic polyps 03/25/2020   Hyperlipidemia    Hypertension    Migraines     not sure what triggers them; I'll have 1 q couple weeks or more; come 2 days in a row when they come (01/04/2018)   Mitral valve prolapse    OSTEOPOROSIS 08/17/2009   PONV (postoperative nausea and vomiting)    Supraventricular tachycardia (HCC)    Past Surgical History:  Procedure Laterality Date   BREAST BIOPSY Right    benign   CATARACT EXTRACTION W/ INTRAOCULAR LENS  IMPLANT, BILATERAL     CERVICAL CONE BIOPSY  2000s   CERVIX LESION DESTRUCTION  1991   dysplasia; lesions   CHOLECYSTECTOMY N/A 03/14/2013   Procedure: LAPAROSCOPIC CHOLECYSTECTOMY WITH ATTEMPTED INTRAOPERATIVE CHOLANGIOGRAM;  Surgeon: Vicenta DELENA Poli, MD;  Location: [facility] OR;  Service: General;  Laterality: N/A;   HARDWARE REMOVAL Left 07/22/2021   Procedure: HARDWARE REMOVAL;  Surgeon: Celena Sharper, MD;  Location: [facility] OR;  Service: Orthopedics;  Laterality: Left;   laporoscopic abdominal surgery     for endometriosis   LEFT HEART CATH AND CORONARY ANGIOGRAPHY N/A 05/07/2018   Procedure: LEFT HEART CATH AND CORONARY ANGIOGRAPHY;  Surgeon: Claudene Victory ORN, MD;  Location: [facility] INVASIVE CV [facility];  Service: Cardiovascular;  Laterality: N/A;   ORIF ANKLE FRACTURE Left 02/11/2021   Procedure: OPEN REDUCTION INTERNAL FIXATION (ORIF) ANKLE FRACTURE;  Surgeon: Celena Sharper, MD;  Location: [facility] OR;  Service: Orthopedics;  Laterality: Left;   TONSILLECTOMY     Social History:  reports that she quit smoking about 49 years ago. Her smoking use included cigarettes. She started smoking about 56 years ago. She has a 7 pack-year smoking history. She has  been exposed to tobacco smoke. She has never used smokeless tobacco. She reports that she does not currently use alcohol  after a past usage of about 28.0 standard drinks of alcohol  per week. She reports that she does not use drugs.  Allergies  Allergen Reactions   Doxycycline Swelling    Mouth swelling and sores     Tetracycline Hcl Swelling and Other (See Comments)    Mouth  swelling and sores   Codeine Itching    Family History  Problem Relation Age of Onset   Colon cancer Father        Deceased, 30   Osteoporosis Mother        Living, 55   Healthy Brother    Healthy Brother    Healthy Son    Healthy Son    Thyroid  disease Neg Hx    Goiter Neg Hx     Prior to Admission medications   Medication Sig Start Date End Date Taking? Authorizing Provider  alendronate  (FOSAMAX ) 70 MG tablet Take 70 mg by mouth every Monday.    [provider]  baclofen  (LIORESAL ) 10 MG tablet Take 1 tablet (10 mg total) by mouth 3 (three) times daily. 09/14/23 03/12/24  Kober, Charles E, PA-C  buPROPion  (WELLBUTRIN  XL) 150 MG 24 hr tablet Take 1 tablet (150 mg total) by mouth every morning. 09/14/23 03/12/24  Kober, Charles E, PA-C  busPIRone  (BUSPAR ) 15 MG tablet Take 1 tablet (15 mg total) by mouth 3 (three) times daily. 08/07/23   Ghimire, Donalda HERO, MD  Calcium  Carbonate-Vitamin D  (CALCIUM  600+D PO) Take 1 tablet by mouth daily.    [provider]  carvedilol  (COREG ) 6.25 MG tablet TAKE 1 TABLET BY MOUTH 2 TIMES DAILY WITH A MEAL. 09/04/23   Gayle, Kara F, PA-C  DULoxetine  (CYMBALTA ) 60 MG capsule Take 60 mg by mouth 2 (two) times daily. Patient not taking: Reported on 09/14/2023 05/05/23   [provider]  feeding supplement (ENSURE PLUS HIGH PROTEIN) LIQD Take 237 mLs by mouth 3 (three) times daily between meals. Patient not taking: Reported on 09/14/2023 08/07/23   [name], MD  fluticasone  (FLONASE ) 50 MCG/ACT nasal spray Place 2 sprays into both nostrils daily as needed for allergies.    [provider]  folic acid  (FOLVITE ) 1 MG tablet TAKE 1 TABLET BY MOUTH DAILY. 06/27/23   Chandra Toribio POUR, MD  gabapentin  (NEURONTIN ) 300 MG capsule Take 1 capsule (300 mg total) by mouth 2 (two) times daily. 09/14/23 12/13/23  Kober, Charles E, PA-C  hydrOXYzine  (ATARAX ) 25 MG tablet Take 25 mg by mouth 3 (three) times daily. 01/18/23   [provider]  lidocaine  (LIDODERM ) 5 % Place 1 patch onto the skin daily. Remove & Discard patch within 12 hours or as directed by MD.  Apply to left flank area. Patient not taking: Reported on 09/14/2023 08/08/23   [name], MD  lidocaine  (LIDODERM ) 5 % Place 1 patch onto the skin daily. Remove & Discard patch within 12 hours or as directed by MD-apply to lower back area. Patient not taking: Reported on 09/14/2023 08/08/23   [name], MD  Multiple Vitamins-Minerals (ADULT GUMMY PO) Take 2 tablets by mouth daily.    [provider]  NON FORMULARY Testosterone  20 mg/1ml. Use small pea-sized amount once a week as directed 05/09/23   [provider]  norethindrone -ethinyl estradiol  (FEMHRT  1/5) 1-5 MG-MCG TABS tablet Take 1 tablet by mouth daily.    [provider]  oxyCODONE  (ROXICODONE ) 5 MG immediate release tablet Take 1 tablet (5 mg total) by mouth every 6 (six) hours as needed for up to 5 doses for severe pain (pain score 7-10). Patient not taking: Reported on 09/14/2023 08/07/23   [name], MD  pantoprazole  (PROTONIX ) 40 MG tablet TAKE 1 TABLET BY MOUTH 2 TIMES DAILY. 05/08/23   Chandra Toribio POUR, MD  propranolol  (INDERAL ) 10 MG tablet Take 1-2 tablets at onset of panic no more than 2 times daily 09/14/23   Kober, Charles E, PA-C  QUEtiapine  (SEROQUEL ) 25 MG tablet Take 1 tablet (25 mg total) by mouth at bedtime. Patient not taking: Reported on 09/14/2023 08/07/23   [name], MD  SUMAtriptan  (IMITREX ) 100 MG tablet TAKE 1 TABLET BY MOUTH AS NEEDED FOR MIGRAINE. MAY REPEAT IN 2 HOURS IF HEADACHE PERSISTS OR RECURS. MAX 2 TABLETS PER DAY 08/15/23   Gayle Saddie FALCON, [place]    Physical Exam: Vitals:   09/28/23 1230 09/28/23 1315 09/28/23 1407 09/28/23 1546  BP: 127/72 (!) 130/117  (!) 141/98  Pulse: (!) 128 (!) 116  (!) 122  Resp: 15 20    Temp:   98.6 F (37 C)   TempSrc:   Oral   SpO2: 100% 100%    Weight:      Height:       General:  Alert, oriented x3, resting comfortably in no acute distress Respiratory: Lungs clear to auscultation bilaterally with normal respiratory effort; no w/r/r Cardiovascular: Tachycardic; no m/r/g   Data Reviewed: {Tip this will not be part of the note when signed- Document your independent interpretation of telemetry tracing, EKG, lab, Radiology test or any other diagnostic tests. Add any new diagnostic test ordered today. (Optional):[id number]} Lab Results  Component Value Date   WBC 8.0 09/28/2023   HGB 15.9 (H) 09/28/2023   HCT 49.3 (H) 09/28/2023   MCV 92.7 09/28/2023   PLT PLATELET CLUMPS NOTED ON SMEAR, UNABLE TO ESTIMATE 09/28/2023   Lab Results  Component Value Date   GLUCOSE 84 09/28/2023   CALCIUM  9.0 09/28/2023   NA 147 (H) 09/28/2023   K 4.4 09/28/2023   CO2 10 (L) 09/28/2023   CL 113 (H) 09/28/2023   BUN 10 09/28/2023   CREATININE 0.72 09/28/2023   Lab Results  Component Value Date   ALT 12 09/28/2023   AST 29 09/28/2023   ALKPHOS 94 09/28/2023   BILITOT 1.1 09/28/2023   Lab Results  Component Value Date   INR 1.0 07/24/2023   INR 1.1 01/26/2022   INR 1.0 06/08/2021    Radiology: DG Chest Portable 1 View Result Date: 09/28/2023 CLINICAL DATA:  Chest pain. EXAM: PORTABLE CHEST 1 VIEW COMPARISON:  None Available. FINDINGS: No focal consolidation, pleural effusion, pneumothorax. The cardiac silhouette is within limits. No acute osseous pathology. Old bilateral rib fractures. IMPRESSION: No active disease. Electronically Signed   By: Vanetta Chou M.D.   On: 09/28/2023 10:48    Assessment and Plan: 85F h/o alcohol  use disorder, hypertension, hyperlipidemia, GERD, peripheral neuropathy p/w alcohol  withdrawal.  Alcohol  withdrawal -IV/PO ativan  per CIWA protocol -MIVF: NS at 100cc/h for 24h  HTN -PTA Coreg  6.25mg  BID  Mood disorder -PTA Wellbutrin  150mg  daily   Advance Care Planning:   Code Status: Full Code   Consults: N/A  Family Communication:  N/A  Severity of Illness: The appropriate patient status for this patient is INPATIENT. Inpatient status is judged to be reasonable and necessary in order to provide the  required intensity of service to ensure the patient's safety. The patient's presenting symptoms, physical exam findings, and initial radiographic and laboratory data in the context of their chronic comorbidities is felt to place them at high risk for further clinical deterioration. Furthermore, it is not anticipated that the patient will be medically stable for discharge from the hospital within 2 midnights of admission.   * I certify that at the point of admission it is my clinical judgment that the patient will require inpatient hospital care spanning beyond 2 midnights from the point of admission due to high intensity of service, high risk for further deterioration and high frequency of surveillance required.*   ------- I spent 55 minutes reviewing previous labs/notes, obtaining separate history at the bedside, counseling/discussing the treatment plan outlined above, ordering medications/tests, and performing clinical documentation.  Author: Marsha Ada, MD 09/28/2023 3:50 PM  For on call review [email or web address].

## 2023-09-28 NOTE — ED Triage Notes (Signed)
 [facility] reports pt coming from home for initial complaints of alcohol  withdrawals, last drank last night and had some wine. Enroute pt c/o chest pain. Pt c/o her whole body hurting.

## 2023-09-28 NOTE — ED Provider Notes (Signed)
 [place] EMERGENCY DEPARTMENT AT [facility] Provider Note   CSN: [id number] Arrival date & time: 09/28/23  1002     Patient presents with: Chest Pain   Terri Ayala is a 68 y.o. female past medical history significant for hypertension, hyperlipidemia, GERD, peripheral neuropathy, alcohol  use disorder with recent admission and discharged on 08/07/2023 for acute alcoholic encephalopathy who presents emergency department with concerns for alcohol  withdrawal and chest pain.  Patient states that her last drink of alcohol  was approximately 5 to 6 PM last night.  Patient states since then she has been experiencing muscle aches, tremors and this morning began experiencing substernal dull chest pain without radiation.  Patient denies associated shortness of breath however does endorse 1 episode of nonbloody nonbilious emesis this a.m.  Patient denies hematemesis, hematochezia, melena    Chest Pain      Prior to Admission medications   Medication Sig Start Date End Date Taking? Authorizing Provider  alendronate  (FOSAMAX ) 70 MG tablet Take 70 mg by mouth every Monday.    [provider]  baclofen  (LIORESAL ) 10 MG tablet Take 1 tablet (10 mg total) by mouth 3 (three) times daily. 09/14/23 03/12/24  Kober, Charles E, PA-C  buPROPion  (WELLBUTRIN  XL) 150 MG 24 hr tablet Take 1 tablet (150 mg total) by mouth every morning. 09/14/23 03/12/24  Kober, Charles E, PA-C  busPIRone  (BUSPAR ) 15 MG tablet Take 1 tablet (15 mg total) by mouth 3 (three) times daily. 08/07/23   Ghimire, Donalda CHRISTELLA, MD  Calcium  Carbonate-Vitamin D  (CALCIUM  600+D PO) Take 1 tablet by mouth daily.    [provider]  carvedilol  (COREG ) 6.25 MG tablet TAKE 1 TABLET BY MOUTH 2 TIMES DAILY WITH A MEAL. 09/04/23   Clapp, Kara F, PA-C  DULoxetine  (CYMBALTA ) 60 MG capsule Take 60 mg by mouth 2 (two) times daily. Patient not taking: Reported on 09/14/2023 05/05/23   [provider]  feeding supplement (ENSURE PLUS  HIGH PROTEIN) LIQD Take 237 mLs by mouth 3 (three) times daily between meals. Patient not taking: Reported on 09/14/2023 08/07/23   Raenelle Donalda CHRISTELLA, MD  fluticasone  (FLONASE ) 50 MCG/ACT nasal spray Place 2 sprays into both nostrils daily as needed for allergies.    [provider]  folic acid  (FOLVITE ) 1 MG tablet TAKE 1 TABLET BY MOUTH DAILY. 06/27/23   Chandra Toribio POUR, MD  gabapentin  (NEURONTIN ) 300 MG capsule Take 1 capsule (300 mg total) by mouth 2 (two) times daily. 09/14/23 12/13/23  Kober, Charles E, PA-C  hydrOXYzine  (ATARAX ) 25 MG tablet Take 25 mg by mouth 3 (three) times daily. 01/18/23   [provider]  lidocaine  (LIDODERM ) 5 % Place 1 patch onto the skin daily. Remove & Discard patch within 12 hours or as directed by MD.  Apply to left flank area. Patient not taking: Reported on 09/14/2023 08/08/23   Raenelle Donalda CHRISTELLA, MD  lidocaine  (LIDODERM ) 5 % Place 1 patch onto the skin daily. Remove & Discard patch within 12 hours or as directed by MD-apply to lower back area. Patient not taking: Reported on 09/14/2023 08/08/23   Raenelle Donalda CHRISTELLA, MD  Multiple Vitamins-Minerals (ADULT GUMMY PO) Take 2 tablets by mouth daily.    [provider]  NON FORMULARY Testosterone  20 mg/1ml. Use small pea-sized amount once a week as directed 05/09/23   [provider]  norethindrone -ethinyl estradiol  (FEMHRT  1/5) 1-5 MG-MCG TABS tablet Take 1 tablet by mouth daily.    [provider]  oxyCODONE  (ROXICODONE )  5 MG immediate release tablet Take 1 tablet (5 mg total) by mouth every 6 (six) hours as needed for up to 5 doses for severe pain (pain score 7-10). Patient not taking: Reported on 09/14/2023 08/07/23   [name], MD  pantoprazole  (PROTONIX ) 40 MG tablet TAKE 1 TABLET BY MOUTH 2 TIMES DAILY. 05/08/23   Chandra Toribio POUR, MD  propranolol  (INDERAL ) 10 MG tablet Take 1-2 tablets at onset of panic no more than 2 times daily 09/14/23   Kober, Charles E, PA-C   QUEtiapine  (SEROQUEL ) 25 MG tablet Take 1 tablet (25 mg total) by mouth at bedtime. Patient not taking: Reported on 09/14/2023 08/07/23   [name], MD  SUMAtriptan  (IMITREX ) 100 MG tablet TAKE 1 TABLET BY MOUTH AS NEEDED FOR MIGRAINE. MAY REPEAT IN 2 HOURS IF HEADACHE PERSISTS OR RECURS. MAX 2 TABLETS PER DAY 08/15/23   Gayle Numbers F, PA-C    Allergies: Doxycycline, Tetracycline hcl, and Codeine    Review of Systems  Cardiovascular:  Positive for chest pain.  ROS reviewed in HPI  Updated Vital Signs BP (!) 130/117   Pulse (!) 116   Temp 98.6 F (37 C) (Oral)   Resp 20   Ht 5' 2 (1.575 m)   Wt 55 kg   SpO2 100%   BMI 22.18 kg/m   Physical Exam Vitals and nursing note reviewed.  Constitutional:      General: She is not in acute distress. HENT:     Head: Normocephalic and atraumatic.  Cardiovascular:     Rate and Rhythm: Regular rhythm. Tachycardia present.     Pulses:          Radial pulses are 2+ on the right side and 2+ on the left side.       Dorsalis pedis pulses are 2+ on the right side and 2+ on the left side.     Heart sounds: Normal heart sounds. No murmur heard. Pulmonary:     Effort: Pulmonary effort is normal. No tachypnea.     Breath sounds: Normal breath sounds.  Abdominal:     General: There is no distension.     Palpations: Abdomen is soft.     Tenderness: There is generalized abdominal tenderness.  Musculoskeletal:     Right lower leg: No edema.     Left lower leg: No edema.     Comments: Spontaneous movement of bilateral upper and lower extremities  Skin:    General: Skin is warm.     Capillary Refill: Capillary refill takes less than 2 seconds.  Neurological:     Mental Status: She is alert.     Motor: Tremor present.     Comments: Bilateral upper extremity tremulousness  Psychiatric:        Attention and Perception: Attention normal.        Mood and Affect: Mood normal.        Speech: Speech normal.        Behavior: Behavior is  cooperative.     (all labs ordered are listed, but only abnormal results are displayed) Labs Reviewed  CBC WITH DIFFERENTIAL/PLATELET - Abnormal; Notable for the following components:      Result Value   RBC 5.32 (*)    Hemoglobin 15.9 (*)    HCT 49.3 (*)    All other components within normal limits  COMPREHENSIVE METABOLIC PANEL WITH GFR - Abnormal; Notable for the following components:   Sodium 147 (*)    Chloride 113 (*)  CO2 10 (*)    Anion gap 24 (*)    All other components within normal limits  MAGNESIUM   D-DIMER, QUANTITATIVE  CBC  CREATININE, SERUM  TROPONIN I (HIGH SENSITIVITY)  TROPONIN I (HIGH SENSITIVITY)    EKG: EKG Interpretation Date/Time:  Thursday September 28 2023 10:10:42 EDT Ventricular Rate:  124 PR Interval:    QRS Duration:  93 QT Interval:  341 QTC Calculation: 490 R Axis:   -8  Text Interpretation: sinus tachycardia Anterior infarct, old No significant change since last tracing Confirmed by Doretha Folks ([id number]) on 09/28/2023 3:15:09 PM  Radiology: DG Chest Portable 1 View Result Date: 09/28/2023 CLINICAL DATA:  Chest pain. EXAM: PORTABLE CHEST 1 VIEW COMPARISON:  None Available. FINDINGS: No focal consolidation, pleural effusion, pneumothorax. The cardiac silhouette is within limits. No acute osseous pathology. Old bilateral rib fractures. IMPRESSION: No active disease. Electronically Signed   By: Vanetta Chou M.D.   On: 09/28/2023 10:48     Procedures   Medications Ordered in the ED  PHENObarbital  (LUMINAL) injection 130 mg (has no administration in time range)  enoxaparin  (LOVENOX ) injection 40 mg (has no administration in time range)  PHENObarbital  (LUMINAL) injection 130 mg (130 mg Intravenous Given 09/28/23 1107)  lactated ringers  bolus 500 mL (0 mLs Intravenous Stopped 09/28/23 1514)  thiamine  (VITAMIN B1) tablet 100 mg (100 mg Oral Given 09/28/23 1224)  folic acid  (FOLVITE ) tablet 1 mg (1 mg Oral Given 09/28/23 1224)  multivitamin  with minerals tablet 1 tablet (1 tablet Oral Given 09/28/23 1224)  PHENObarbital  (LUMINAL) injection 130 mg (130 mg Intravenous Given 09/28/23 1347)  lactated ringers  bolus 500 mL (0 mLs Intravenous Stopped 09/28/23 1514)    Clinical Course as of 09/28/23 1532  Thu Sep 28, 2023  1034 EKG reviewed by me: Sinus tachycardia with normal axis and intervals.  No evidence of acute ischemic change, heart block or dysrhythmia [AG]  1051 Chest x-ray reviewed by me today: Trachea is midline.  No pneumothorax.  No cardiomegaly, pleural effusion or pulmonary edema.  No obvious focal consolidative findings [AG]  1313 Wells 1.5 [AG]  1351 IV replacement needed.  Fluid bolus and second phenobarbital  dose will be administered now [AG]  1455 D dimer negative [AG]  1505 Hospitalist consulted [AG]  1513 S- aud last yd, here for wd Phenobarb, still tachy, nausea Admit to hospitlalist  [RC]  1531 Discussed with admitting hospitalist who accepts patient for admission [MP]    Clinical Course User Index [AG] Nada Chroman, DO [MP] Pamella Ozell LABOR, DO [RC] Sharyne Darina RAMAN, MD                                 Medical Decision Making Amount and/or Complexity of Data Reviewed Labs: ordered. Radiology: ordered.  Risk OTC drugs. Prescription drug management.   On initial valuation patient is alert and oriented, tachycardic however normotensive and not in acute distress.  Patient does have evidence of tachycardia and bilateral upper extremity tremors secondary to presumed alcohol  withdrawal.  Based upon patient's history and physical examination findings, differential diagnosis includes alcohol  withdrawal, polysubstance withdrawal, electrolyte abnormalities, sepsis/infection, pulmonary embolism. therefore, we will give phenobarbital .  As patient is endorsing chest pain, will workup patient for ACS/MI and obtain basic laboratory studies to assess for electrolyte abnormalities.  Laboratory studies significant for  hyponatremia and hemoconcentration therefore will give 500 cc fluid bolus with respect for HFpEF.  On reevaluation patient continues  to be tachycardic therefore we will give repeat 500 cc fluid bolus as well as repeat phenobarbital .  Will obtain D-dimer as patient continues to be tachycardic.  D-dimer within normal limits therefore CTA PE study not necessary at this time.  Less likely ACS/MI as patient has no acute ischemic findings on EKG and troponin is within normal limits.  During reevaluation patient does continue to be tachycardic, diaphoretic and nauseous therefore we will repeat phenobarbital  and consult hospitalist for admission for acute alcohol  withdrawal     Final diagnoses:  Alcohol  abuse with withdrawal, unspecified [facility])    ED Discharge Orders     None      Lavanda Bolster DO  Emergency Medicine PGY2    Bolster Lavanda, DO 09/28/23 1533    Pamella Ozell LABOR, DO 10/07/23 1655

## 2023-09-28 NOTE — ED Notes (Signed)
 Unable to get IV access, IV consult placed, provider aware.

## 2023-09-29 ENCOUNTER — Inpatient Hospital Stay (HOSPITAL_COMMUNITY)

## 2023-09-29 DIAGNOSIS — F10139 Alcohol abuse with withdrawal, unspecified: Secondary | ICD-10-CM

## 2023-09-29 LAB — CBC WITH DIFFERENTIAL/PLATELET
Abs Immature Granulocytes: 0.02 K/uL (ref 0.00–0.07)
Basophils Absolute: 0.1 K/uL (ref 0.0–0.1)
Basophils Relative: 1 %
Eosinophils Absolute: 0 K/uL (ref 0.0–0.5)
Eosinophils Relative: 1 %
HCT: 38.1 % (ref 36.0–46.0)
Hemoglobin: 12.6 g/dL (ref 12.0–15.0)
Immature Granulocytes: 0 %
Lymphocytes Relative: 21 %
Lymphs Abs: 1.4 K/uL (ref 0.7–4.0)
MCH: 29.9 pg (ref 26.0–34.0)
MCHC: 33.1 g/dL (ref 30.0–36.0)
MCV: 90.3 fL (ref 80.0–100.0)
Monocytes Absolute: 0.5 K/uL (ref 0.1–1.0)
Monocytes Relative: 7 %
Neutro Abs: 5 K/uL (ref 1.7–7.7)
Neutrophils Relative %: 70 %
Platelets: 210 K/uL (ref 150–400)
RBC: 4.22 MIL/uL (ref 3.87–5.11)
RDW: 13.9 % (ref 11.5–15.5)
WBC: 7 K/uL (ref 4.0–10.5)
nRBC: 0 % (ref 0.0–0.2)

## 2023-09-29 LAB — PHOSPHORUS: Phosphorus: 3.2 mg/dL (ref 2.5–4.6)

## 2023-09-29 LAB — COMPREHENSIVE METABOLIC PANEL WITH GFR
ALT: 9 U/L (ref 0–44)
AST: 19 U/L (ref 15–41)
Albumin: 3.2 g/dL — ABNORMAL LOW (ref 3.5–5.0)
Alkaline Phosphatase: 68 U/L (ref 38–126)
Anion gap: 12 (ref 5–15)
BUN: 14 mg/dL (ref 8–23)
CO2: 19 mmol/L — ABNORMAL LOW (ref 22–32)
Calcium: 7.9 mg/dL — ABNORMAL LOW (ref 8.9–10.3)
Chloride: 108 mmol/L (ref 98–111)
Creatinine, Ser: 0.83 mg/dL (ref 0.44–1.00)
GFR, Estimated: >60 mL/min (ref >60)
Glucose, Bld: 99 mg/dL (ref 70–99)
Potassium: 3.7 mmol/L (ref 3.5–5.1)
Sodium: 139 mmol/L (ref 135–145)
Total Bilirubin: 1.5 mg/dL — ABNORMAL HIGH (ref 0.0–1.2)
Total Protein: 5.9 g/dL — ABNORMAL LOW (ref 6.5–8.1)

## 2023-09-29 LAB — AMMONIA: Ammonia: 53 umol/L — ABNORMAL HIGH (ref 9–35)

## 2023-09-29 LAB — C-REACTIVE PROTEIN: CRP: <0.5 mg/dL (ref <1.0)

## 2023-09-29 LAB — PROCALCITONIN: Procalcitonin: <0.1 ng/mL

## 2023-09-29 LAB — MAGNESIUM: Magnesium: 1.8 mg/dL (ref 1.7–2.4)

## 2023-09-29 MED ORDER — SODIUM CHLORIDE 0.9 % IV SOLN: INTRAVENOUS | Status: DC

## 2023-09-29 MED ORDER — CHLORDIAZEPOXIDE HCL 5 MG PO CAPS
10.0000 mg | ORAL_CAPSULE | Freq: Three times a day (TID) | ORAL | Status: DC
  Administered 2023-09-29 (×3): 10 mg via ORAL
  Filled 2023-09-29 (×3): qty 2

## 2023-09-29 MED ORDER — LACTULOSE 10 GM/15ML PO SOLN
30.0000 g | Freq: Two times a day (BID) | ORAL | Status: AC
  Administered 2023-09-29 (×2): 30 g via ORAL
  Filled 2023-09-29 (×2): qty 60

## 2023-09-29 NOTE — Progress Notes (Signed)
 PROGRESS NOTE                                                                                                                                                                                                             Patient Demographics:    Terri Ayala, is a 68 y.o. female, DOB - 01-Jul-1955, FMW:[id number]  Outpatient Primary MD for the patient is Terri Ayala Terri Ayala    LOS - 1  Admit date - 09/28/2023    Chief Complaint  Patient presents with   Chest Pain       Brief Narrative (HPI from H&P)    68 y.o. female with medical history significant of alcohol  use disorder, hypertension, hyperlipidemia, GERD, peripheral neuropathy p/w alcohol  withdrawal.   Pt unable to provide medical history. From what I can gather from telephone conversation with EDP and brief pt interview, Patient states that her last drink of alcohol  was approximately 5 to 6 PM last night. Patient states since then she has been experiencing muscle aches, tremors and this morning began experiencing substernal dull chest pain without radiation. Patient denies associated shortness of breath however does endorse 1 episode of nonbloody nonbilious emesis this a.m. Patient denies hematemesis, hematochezia, melena .   Of note, pt was recently admitted in 07/2023 with alcohol  withdrawal and encephalopathy at that time.     In the ED, pt tachycardic and tachypneic on RA c/w withdrawal. Labs notable for Na 147, and anion gap 24. CXR with active disease. EDP administered phenobarbital  x2 in ED and pt with ongoing withdrawal/tachycardia; thus, medicine admission to PCU requested.    Subjective:    Terri Ayala today has, No headache, No chest pain, No abdominal pain - No Nausea, No new weakness tingling or numbness, no SOB   Assessment  & Plan :    Alcohol  withdrawal -IV/PO ativan  per CIWA protocol, add Librium  and IV fluids, and early DTs, strictly counseled to abstain  from alcohol  abuse.  Continue to monitor, any worse will add phenobarb.   HTN -PTA Coreg  6.25mg  BID   Mood disorder -PTA Wellbutrin  150mg  daily         Condition - Extremely Guarded  Family Communication  : None present  Code Status : Full code  Consults  : None  PUD Prophylaxis :  PPI   Procedures  :  Disposition Plan  :    Status is: Inpatient   DVT Prophylaxis  :    enoxaparin  (LOVENOX ) injection 40 mg Start: 09/28/23 1700    Lab Results  Component Value Date   PLT PLATELET CLUMPS NOTED ON SMEAR, UNABLE TO ESTIMATE 09/28/2023    Diet :  Diet Order     None        Inpatient Medications  Scheduled Meds:  buPROPion   150 mg Oral Daily   carvedilol   6.25 mg Oral BID WC   chlordiazePOXIDE   10 mg Oral TID   enoxaparin  (LOVENOX ) injection  40 mg Subcutaneous Q24H   folic acid   1 mg Oral Daily   multivitamin with minerals  1 tablet Oral Daily   pantoprazole   40 mg Oral BID   thiamine   100 mg Oral Daily   Or   thiamine   100 mg Intravenous Daily   Continuous Infusions:  sodium chloride      PRN Meds:.LORazepam  **OR** LORazepam   Antibiotics  :    Anti-infectives (From admission, onward)    None         Objective:   Vitals:   09/28/23 1800 09/28/23 1933 09/29/23 0000 09/29/23 0353  BP: (!) 147/89 (!) 122/92 120/87 128/86  Pulse: (!) 120 (!) 125 (!) 118 (!) 117  Resp: 20 19 (!) 22 19  Temp:  98.5 F (36.9 C) 98.2 F (36.8 C) 98 F (36.7 C)  TempSrc:  Oral Oral Oral  SpO2: 97% 98% 93% 93%  Weight:      Height:        Wt Readings from Last 3 Encounters:  09/28/23 55 kg  07/24/23 54.4 kg  05/01/23 57.6 kg    No intake or output data in the 24 hours ending 09/29/23 1015   Physical Exam  Awake, minimally confused in early DTs,, No new F.N deficits, Normal affect [place].AT,PERRAL Supple Neck, No JVD,   Symmetrical Chest wall movement, Good air movement bilaterally, CTAB RRR,No Gallops,Rubs or new Murmurs,  +ve  B.Sounds, Abd Soft, No tenderness,   No Cyanosis, Clubbing or edema       Data Review:    Recent Labs  Lab 09/28/23 1050  WBC 8.0  HGB 15.9*  HCT 49.3*  PLT PLATELET CLUMPS NOTED ON SMEAR, UNABLE TO ESTIMATE  MCV 92.7  MCH 29.9  MCHC 32.3  RDW 14.3  LYMPHSABS 1.9  MONOABS 0.7  EOSABS 0.1  BASOSABS 0.1    Recent Labs  Lab 09/28/23 1050 09/28/23 1412 09/29/23 0705  NA 147*  --  139  K 4.4  --  3.7  CL 113*  --  108  CO2 10*  --  19*  ANIONGAP 24*  --  12  GLUCOSE 84  --  99  BUN 10  --  14  CREATININE 0.72  --  0.83  AST 29  --  19  ALT 12  --  9  ALKPHOS 94  --  68  BILITOT 1.1  --  1.5*  ALBUMIN 3.9  --  3.2*  CRP  --   --  <0.5  DDIMER  --  0.39  --   PROCALCITON  --   --  <0.10  AMMONIA  --   --  53*  MG 2.1  --  1.8  PHOS  --   --  3.2  CALCIUM  9.0  --  7.9*      Recent Labs  Lab 09/28/23 1050 09/28/23 1412 09/29/23 0705  CRP  --   --  <  0.5  DDIMER  --  0.39  --   PROCALCITON  --   --  <0.10  AMMONIA  --   --  53*  MG 2.1  --  1.8  CALCIUM  9.0  --  7.9*    --------------------------------------------------------------------------------------------------------------- Lab Results  Component Value Date   CHOL 193 04/22/2022   HDL 49 04/22/2022   LDLCALC 125 (H) 04/22/2022   TRIG 104 04/22/2022   CHOLHDL 3.9 04/22/2022    Lab Results  Component Value Date   HGBA1C 4.7 (L) 11/21/2020   No results for input(s): TSH, T4TOTAL, FREET4, T3FREE, THYROIDAB in the last 72 hours. No results for input(s): VITAMINB12, FOLATE, FERRITIN, TIBC, IRON, RETICCTPCT in the last 72 hours. ------------------------------------------------------------------------------------------------------------------ Cardiac Enzymes No results for input(s): CKMB, TROPONINI, MYOGLOBIN in the last 168 hours.  Invalid input(s): CK  Micro Results No results found for this or any previous visit (from the past 240 hours).  Radiology  Report DG Chest Port 1 View Result Date: 09/29/2023 CLINICAL DATA:  67 year old female with chest pain and shortness of breath. EXAM: PORTABLE CHEST 1 VIEW COMPARISON:  Portable chest yesterday and earlier. FINDINGS: Portable AP semi upright view at 0709 hours. Diffuse bilateral rib fractures, most appear healed and sclerotic as before. Lung volumes are stable at the upper limits of normal to mildly hyperinflated. Normal cardiac size and mediastinal contours. Visualized tracheal air column is within normal limits. Allowing for portable technique the lungs are clear. No pneumothorax or pleural effusion. Paucity of bowel gas. IMPRESSION: 1.  No acute cardiopulmonary abnormality. 2. Numerous nonacute bilateral rib fractures. Electronically Signed   By: VEAR Hurst M.D.   On: 09/29/2023 07:53   DG Chest Portable 1 View Result Date: 09/28/2023 CLINICAL DATA:  Chest pain. EXAM: PORTABLE CHEST 1 VIEW COMPARISON:  None Available. FINDINGS: No focal consolidation, pleural effusion, pneumothorax. The cardiac silhouette is within limits. No acute osseous pathology. Old bilateral rib fractures. IMPRESSION: No active disease. Electronically Signed   By: Vanetta Chou M.D.   On: 09/28/2023 10:48     Signature  -   Lavada Stank M.D on 09/29/2023 at 10:15 AM   -  To page go to [email or web address]

## 2023-09-29 NOTE — Evaluation (Signed)
 Physical Therapy Evaluation Patient Details Name: Terri Ayala MRN: [id number] DOB: 08/26/1955 Today's Date: 09/29/2023  History of Present Illness  68 y.o. female adm 5/26 with medical history significant of depression, anxiety, PTSD, heavy alcohol  use, hypertension neuropathy migraines.  Presented after a fall at home down 5 steps.  Imaging reveals Acute displaced fracture of the posterior left eighth rib.  Clinical Impression  Pt presents with admitting diagnosis above. Pt today was able to ambulate around unit with RW at supervision level. PTA pt reports that she was Mod I with RW and PCA that come by everyday for 3-5 hours to assist with ADLs. Pt appears to be very close to baseline and will likely not need any post acute PT upon DC however PT will continue to follow acutely. Pt would benefit from continued mobility with mobility specialist during acute stay.        If plan is discharge home, recommend the following: A little help with walking and/or transfers;A little help with bathing/dressing/bathroom;Assistance with cooking/housework;Assist for transportation;Help with stairs or ramp for entrance;Supervision due to cognitive status   Can travel by private vehicle        Equipment Recommendations None recommended by PT  Recommendations for Other Services       Functional Status Assessment Patient has had a recent decline in their functional status and demonstrates the ability to make significant improvements in function in a reasonable and predictable amount of time.     Precautions / Restrictions Precautions Precautions: Fall Recall of Precautions/Restrictions: Impaired Restrictions Weight Bearing Restrictions Per Provider Order: No      Mobility  Bed Mobility Overal bed mobility: Needs Assistance Bed Mobility: Supine to Sit, Sit to Supine     Supine to sit: Supervision Sit to supine: Supervision   General bed mobility comments: no physical assistance needed. Pt  reliant on momentum.    Transfers Overall transfer level: Needs assistance Equipment used: Rolling walker (2 wheels) Transfers: Sit to/from Stand Sit to Stand: Supervision           General transfer comment: Cues for hand placement. No physical assistance needed.    Ambulation/Gait Ambulation/Gait assistance: Supervision Gait Distance (Feet): 150 Feet Assistive device: Rolling walker (2 wheels) Gait Pattern/deviations: WFL(Within Functional Limits) Gait velocity: WFL     General Gait Details: no LOB noted.  Stairs            Wheelchair Mobility     Tilt Bed    Modified Rankin (Stroke Patients Only)       Balance Overall balance assessment: No apparent balance deficits (not formally assessed)                                           Pertinent Vitals/Pain Pain Assessment Pain Assessment: No/denies pain    Home Living Family/patient expects to be discharged to:: Private residence Living Arrangements: Alone Available Help at Discharge: Personal care attendant (Everyday for 3-5 hours) Type of Home: House Home Access: Stairs to enter Entrance Stairs-Rails: Doctor, general practice of Steps: 1   Home Layout: 1/2 bath on main level;Bed/bath upstairs;Able to live on main level with bedroom/bathroom Home Equipment: Rolling Walker (2 wheels);Cane - single point;BSC/3in1;Shower seat      Prior Function Prior Level of Function : Patient poor historian/Family not available             Mobility Comments: Has a  2WRW availale, unclear if she uses it consistently. ADLs Comments: PCA daily that assists with iADL, meals, community mobility and assists with her pet.  Patient stating she performs her own sinkbath.     Extremity/Trunk Assessment   Upper Extremity Assessment Upper Extremity Assessment: Overall WFL for tasks assessed    Lower Extremity Assessment Lower Extremity Assessment: Overall WFL for tasks assessed     Cervical / Trunk Assessment Cervical / Trunk Assessment: Normal  Communication   Communication Communication: Impaired Factors Affecting Communication: Hearing impaired    Cognition Arousal: Lethargic, Alert Behavior During Therapy: Flat affect   PT - Cognitive impairments: Problem solving, Safety/Judgement, Sequencing, Difficult to assess Difficult to assess due to: Hard of hearing/deaf                     PT - Cognition Comments: Pt followed commands however was noted to be very HOH. Following commands: Intact       Cueing Cueing Techniques: Verbal cues, Tactile cues     General Comments General comments (skin integrity, edema, etc.): VSS    Exercises     Assessment/Plan    PT Assessment Patient needs continued PT services  PT Problem List Decreased strength;Decreased range of motion;Decreased activity tolerance;Decreased balance;Decreased mobility;Decreased coordination;Decreased safety awareness;Decreased knowledge of use of DME;Decreased knowledge of precautions;Cardiopulmonary status limiting activity;Decreased cognition       PT Treatment Interventions DME instruction;Gait training;Therapeutic activities;Functional mobility training;Stair training;Therapeutic exercise;Balance training;Neuromuscular re-education;Cognitive remediation;Patient/family education    PT Goals (Current goals can be found in the Care Plan section)  Acute Rehab PT Goals Patient Stated Goal: to go home PT Goal Formulation: With patient Time For Goal Achievement: 10/13/23 Potential to Achieve Goals: Good    Frequency Min 2X/week     Co-evaluation               AM-PAC PT 6 Clicks Mobility  Outcome Measure Help needed turning from your back to your side while in a flat bed without using bedrails?: None Help needed moving from lying on your back to sitting on the side of a flat bed without using bedrails?: A Little Help needed moving to and from a bed to a chair  (including a wheelchair)?: A Little Help needed standing up from a chair using your arms (e.g., wheelchair or bedside chair)?: A Little Help needed to walk in hospital room?: A Little Help needed climbing 3-5 steps with a railing? : A Little 6 Click Score: 19    End of Session Equipment Utilized During Treatment: Gait belt Activity Tolerance: Patient tolerated treatment well Patient left: in bed;with call bell/phone within reach;with chair alarm set (Bed alarm on bed not working so chair alarm pad placed under pt in bed.) Nurse Communication: Mobility status PT Visit Diagnosis: Other abnormalities of gait and mobility (R26.89)    Time: 1425-1440 PT Time Calculation (min) (ACUTE ONLY): 15 min   Charges:   PT Evaluation $PT Eval Moderate Complexity: 1 Mod   PT General Charges $$ ACUTE PT VISIT: 1 Visit         Sueellen NOVAK, PT, DPT Acute Rehab Services [phone]   Madalyn Legner 09/29/2023, 3:41 PM

## 2023-09-29 NOTE — Plan of Care (Signed)

## 2023-09-30 DIAGNOSIS — F10139 Alcohol abuse with withdrawal, unspecified: Secondary | ICD-10-CM | POA: Diagnosis not present

## 2023-09-30 LAB — CBC WITH DIFFERENTIAL/PLATELET
Abs Immature Granulocytes: 0.02 K/uL (ref 0.00–0.07)
Basophils Absolute: 0 K/uL (ref 0.0–0.1)
Basophils Relative: 1 %
Eosinophils Absolute: 0.1 K/uL (ref 0.0–0.5)
Eosinophils Relative: 2 %
HCT: 33.8 % — ABNORMAL LOW (ref 36.0–46.0)
Hemoglobin: 11.1 g/dL — ABNORMAL LOW (ref 12.0–15.0)
Immature Granulocytes: 0 %
Lymphocytes Relative: 25 %
Lymphs Abs: 1.3 K/uL (ref 0.7–4.0)
MCH: 30.1 pg (ref 26.0–34.0)
MCHC: 32.8 g/dL (ref 30.0–36.0)
MCV: 91.6 fL (ref 80.0–100.0)
Monocytes Absolute: 0.4 K/uL (ref 0.1–1.0)
Monocytes Relative: 9 %
Neutro Abs: 3.1 K/uL (ref 1.7–7.7)
Neutrophils Relative %: 63 %
Platelets: 171 K/uL (ref 150–400)
RBC: 3.69 MIL/uL — ABNORMAL LOW (ref 3.87–5.11)
RDW: 13.5 % (ref 11.5–15.5)
WBC: 4.9 K/uL (ref 4.0–10.5)
nRBC: 0 % (ref 0.0–0.2)

## 2023-09-30 LAB — COMPREHENSIVE METABOLIC PANEL WITH GFR
ALT: 9 U/L (ref 0–44)
AST: 20 U/L (ref 15–41)
Albumin: 3.3 g/dL — ABNORMAL LOW (ref 3.5–5.0)
Alkaline Phosphatase: 71 U/L (ref 38–126)
Anion gap: 13 (ref 5–15)
BUN: 8 mg/dL (ref 8–23)
CO2: 19 mmol/L — ABNORMAL LOW (ref 22–32)
Calcium: 8.2 mg/dL — ABNORMAL LOW (ref 8.9–10.3)
Chloride: 108 mmol/L (ref 98–111)
Creatinine, Ser: 0.59 mg/dL (ref 0.44–1.00)
GFR, Estimated: >60 mL/min (ref >60)
Glucose, Bld: 85 mg/dL (ref 70–99)
Potassium: 3.5 mmol/L (ref 3.5–5.1)
Sodium: 140 mmol/L (ref 135–145)
Total Bilirubin: 1 mg/dL (ref 0.0–1.2)
Total Protein: 5.9 g/dL — ABNORMAL LOW (ref 6.5–8.1)

## 2023-09-30 LAB — MAGNESIUM: Magnesium: 1.7 mg/dL (ref 1.7–2.4)

## 2023-09-30 LAB — AMMONIA: Ammonia: 16 umol/L (ref 9–35)

## 2023-09-30 MED ORDER — MAGNESIUM SULFATE 2 GM/50ML IV SOLN
2.0000 g | Freq: Once | INTRAVENOUS | Status: AC
  Administered 2023-09-30: 2 g via INTRAVENOUS
  Filled 2023-09-30: qty 50

## 2023-09-30 MED ORDER — ACETAMINOPHEN 325 MG PO TABS: 650.0000 mg | ORAL_TABLET | Freq: Four times a day (QID) | ORAL | Status: DC | PRN

## 2023-09-30 MED ORDER — SODIUM CHLORIDE 0.9 % IV SOLN: INTRAVENOUS | Status: AC

## 2023-09-30 MED ORDER — HYDROCODONE-ACETAMINOPHEN 5-325 MG PO TABS
1.0000 | ORAL_TABLET | Freq: Three times a day (TID) | ORAL | Status: DC | PRN
  Administered 2023-09-30 – 2023-10-01 (×3): 1 via ORAL
  Filled 2023-09-30 (×3): qty 1

## 2023-09-30 MED ORDER — POTASSIUM CHLORIDE CRYS ER 20 MEQ PO TBCR
40.0000 meq | EXTENDED_RELEASE_TABLET | Freq: Once | ORAL | Status: AC
  Administered 2023-09-30: 40 meq via ORAL
  Filled 2023-09-30: qty 2

## 2023-09-30 MED ORDER — CHLORDIAZEPOXIDE HCL 5 MG PO CAPS
20.0000 mg | ORAL_CAPSULE | Freq: Three times a day (TID) | ORAL | Status: DC
  Administered 2023-09-30 – 2023-10-02 (×6): 20 mg via ORAL
  Filled 2023-09-30 (×6): qty 4

## 2023-09-30 MED ORDER — AMLODIPINE BESYLATE 5 MG PO TABS
5.0000 mg | ORAL_TABLET | Freq: Every day | ORAL | Status: DC
  Administered 2023-09-30 – 2023-10-02 (×3): 5 mg via ORAL
  Filled 2023-09-30 (×3): qty 1

## 2023-09-30 NOTE — Progress Notes (Signed)
 PROGRESS NOTE                                                                                                                                                                                                             Patient Demographics:    Terri Ayala, is a 68 y.o. female, DOB - 09-14-1955, FMW:[id number]  Outpatient Primary MD for the patient is Terri Ayala JULIANNA DEVONNA    LOS - 2  Admit date - 09/28/2023    Chief Complaint  Patient presents with   Chest Pain       Brief Narrative (HPI from H&P)    68 y.o. female with medical history significant of alcohol  use disorder, hypertension, hyperlipidemia, GERD, peripheral neuropathy p/w alcohol  withdrawal.   Pt unable to provide medical history. From what I can gather from telephone conversation with EDP and brief pt interview, Patient states that her last drink of alcohol  was approximately 5 to 6 PM last night. Patient states since then she has been experiencing muscle aches, tremors and this morning began experiencing substernal dull chest pain without radiation. Patient denies associated shortness of breath however does endorse 1 episode of nonbloody nonbilious emesis this a.m. Patient denies hematemesis, hematochezia, melena .   Of note, pt was recently admitted in 07/2023 with alcohol  withdrawal and encephalopathy at that time.     In the ED, pt tachycardic and tachypneic on RA c/w withdrawal. Labs notable for Na 147, and anion gap 24. CXR with active disease. EDP administered phenobarbital  x2 in ED and pt with ongoing withdrawal/tachycardia; thus, medicine admission to PCU requested.    Subjective:   Patient in bed appears to be in no distress had diffuse hot flashes few times earlier this year morning and some abdominal and chest pain which has resolved, mild tremors, no focal weakness no shortness of breath   Assessment  & Plan :    Alcohol  withdrawal -IV/PO ativan  per  CIWA protocol, DTs increased Librium , continue IV fluids,  strictly counseled to abstain from alcohol  abuse.  Continue to monitor, any worse will add phenobarb.   HTN -PTA Coreg  6.25mg  BID, add Norvasc  for better control   Mood disorder -PTA Wellbutrin  150mg  daily         Condition - Extremely Guarded  Family Communication  : None present  Code Status : Full code  Consults  : None  PUD Prophylaxis :  PPI   Procedures  :            Disposition Plan  :    Status is: Inpatient   DVT Prophylaxis  :    enoxaparin  (LOVENOX ) injection 40 mg Start: 09/28/23 1700    Lab Results  Component Value Date   PLT 171 09/30/2023    Diet :  Diet Order             Diet Heart Room service appropriate? Yes; Fluid consistency: Thin  Diet effective now                    Inpatient Medications  Scheduled Meds:  buPROPion   150 mg Oral Daily   carvedilol   6.25 mg Oral BID WC   chlordiazePOXIDE   20 mg Oral TID   enoxaparin  (LOVENOX ) injection  40 mg Subcutaneous Q24H   folic acid   1 mg Oral Daily   multivitamin with minerals  1 tablet Oral Daily   pantoprazole   40 mg Oral BID   thiamine   100 mg Oral Daily   Or   thiamine   100 mg Intravenous Daily   Continuous Infusions:  sodium chloride      magnesium  sulfate bolus IVPB     PRN Meds:.acetaminophen , HYDROcodone -acetaminophen , LORazepam  **OR** LORazepam   Antibiotics  :    Anti-infectives (From admission, onward)    None         Objective:   Vitals:   09/29/23 1600 09/29/23 2000 09/29/23 2327 09/29/23 2358  BP: 132/83 137/79  (!) 145/83  Pulse: 86 89  95  Resp: 16 18  19   Temp: 98.4 F (36.9 C) 98.6 F (37 C)  97.9 F (36.6 C)  TempSrc: Oral Oral Oral Oral  SpO2: 96% 94%  95%  Weight:      Height:        Wt Readings from Last 3 Encounters:  09/28/23 55 kg  07/24/23 54.4 kg  05/01/23 57.6 kg    No intake or output data in the 24 hours ending 09/30/23 1004   Physical Exam  Awake, in  DTs,, No new F.N deficits, Normal affect [place].AT,PERRAL Supple Neck, No JVD,   Symmetrical Chest wall movement, Good air movement bilaterally, CTAB RRR,No Gallops,Rubs or new Murmurs,  +ve B.Sounds, Abd Soft, No tenderness,   No Cyanosis, Clubbing or edema       Data Review:    Recent Labs  Lab 09/28/23 1050 09/29/23 0856 09/30/23 0626  WBC 8.0 7.0 4.9  HGB 15.9* 12.6 11.1*  HCT 49.3* 38.1 33.8*  PLT PLATELET CLUMPS NOTED ON SMEAR, UNABLE TO ESTIMATE 210 171  MCV 92.7 90.3 91.6  MCH 29.9 29.9 30.1  MCHC 32.3 33.1 32.8  RDW 14.3 13.9 13.5  LYMPHSABS 1.9 1.4 1.3  MONOABS 0.7 0.5 0.4  EOSABS 0.1 0.0 0.1  BASOSABS 0.1 0.1 0.0    Recent Labs  Lab 09/28/23 1050 09/28/23 1412 09/29/23 0705 09/30/23 0626  NA 147*  --  139 140  K 4.4  --  3.7 3.5  CL 113*  --  108 108  CO2 10*  --  19* 19*  ANIONGAP 24*  --  12 13  GLUCOSE 84  --  99 85  BUN 10  --  14 8  CREATININE 0.72  --  0.83 0.59  AST 29  --  19 20  ALT 12  --  9 9  ALKPHOS 94  --  68  71  BILITOT 1.1  --  1.5* 1.0  ALBUMIN 3.9  --  3.2* 3.3*  CRP  --   --  <0.5  --   DDIMER  --  0.39  --   --   PROCALCITON  --   --  <0.10  --   AMMONIA  --   --  53*  --   MG 2.1  --  1.8 1.7  PHOS  --   --  3.2  --   CALCIUM  9.0  --  7.9* 8.2*      Recent Labs  Lab 09/28/23 1050 09/28/23 1412 09/29/23 0705 09/30/23 0626  CRP  --   --  <0.5  --   DDIMER  --  0.39  --   --   PROCALCITON  --   --  <0.10  --   AMMONIA  --   --  53*  --   MG 2.1  --  1.8 1.7  CALCIUM  9.0  --  7.9* 8.2*    --------------------------------------------------------------------------------------------------------------- Lab Results  Component Value Date   CHOL 193 04/22/2022   HDL 49 04/22/2022   LDLCALC 125 (H) 04/22/2022   TRIG 104 04/22/2022   CHOLHDL 3.9 04/22/2022    Lab Results  Component Value Date   HGBA1C 4.7 (L) 11/21/2020   No results for input(s): TSH, T4TOTAL, FREET4, T3FREE, THYROIDAB in the last 72  hours. No results for input(s): VITAMINB12, FOLATE, FERRITIN, TIBC, IRON, RETICCTPCT in the last 72 hours. ------------------------------------------------------------------------------------------------------------------ Cardiac Enzymes No results for input(s): CKMB, TROPONINI, MYOGLOBIN in the last 168 hours.  Invalid input(s): CK  Micro Results No results found for this or any previous visit (from the past 240 hours).  Radiology Report DG Chest Port 1 View Result Date: 09/29/2023 CLINICAL DATA:  68 year old female with chest pain and shortness of breath. EXAM: PORTABLE CHEST 1 VIEW COMPARISON:  Portable chest yesterday and earlier. FINDINGS: Portable AP semi upright view at 0709 hours. Diffuse bilateral rib fractures, most appear healed and sclerotic as before. Lung volumes are stable at the upper limits of normal to mildly hyperinflated. Normal cardiac size and mediastinal contours. Visualized tracheal air column is within normal limits. Allowing for portable technique the lungs are clear. No pneumothorax or pleural effusion. Paucity of bowel gas. IMPRESSION: 1.  No acute cardiopulmonary abnormality. 2. Numerous nonacute bilateral rib fractures. Electronically Signed   By: VEAR Hurst M.D.   On: 09/29/2023 07:53   DG Chest Portable 1 View Result Date: 09/28/2023 CLINICAL DATA:  Chest pain. EXAM: PORTABLE CHEST 1 VIEW COMPARISON:  None Available. FINDINGS: No focal consolidation, pleural effusion, pneumothorax. The cardiac silhouette is within limits. No acute osseous pathology. Old bilateral rib fractures. IMPRESSION: No active disease. Electronically Signed   By: Vanetta Chou M.D.   On: 09/28/2023 10:48     Signature  -   Lavada Stank M.D on 09/30/2023 at 10:04 AM   -  To page go to [email or web address]

## 2023-09-30 NOTE — Plan of Care (Signed)

## 2023-09-30 NOTE — Plan of Care (Signed)

## 2023-10-01 DIAGNOSIS — F10139 Alcohol abuse with withdrawal, unspecified: Secondary | ICD-10-CM | POA: Diagnosis not present

## 2023-10-01 LAB — COMPREHENSIVE METABOLIC PANEL WITH GFR
ALT: 8 U/L (ref 0–44)
AST: 32 U/L (ref 15–41)
Albumin: 3.5 g/dL (ref 3.5–5.0)
Alkaline Phosphatase: 80 U/L (ref 38–126)
Anion gap: 13 (ref 5–15)
BUN: <5 mg/dL — ABNORMAL LOW (ref 8–23)
CO2: 19 mmol/L — ABNORMAL LOW (ref 22–32)
Calcium: 8.6 mg/dL — ABNORMAL LOW (ref 8.9–10.3)
Chloride: 105 mmol/L (ref 98–111)
Creatinine, Ser: 0.61 mg/dL (ref 0.44–1.00)
GFR, Estimated: >60 mL/min (ref >60)
Glucose, Bld: 78 mg/dL (ref 70–99)
Potassium: 3.8 mmol/L (ref 3.5–5.1)
Sodium: 137 mmol/L (ref 135–145)
Total Bilirubin: 1.4 mg/dL — ABNORMAL HIGH (ref 0.0–1.2)
Total Protein: 6.4 g/dL — ABNORMAL LOW (ref 6.5–8.1)

## 2023-10-01 LAB — MAGNESIUM: Magnesium: 1.8 mg/dL (ref 1.7–2.4)

## 2023-10-01 LAB — AMMONIA: Ammonia: 35 umol/L (ref 9–35)

## 2023-10-01 MED ORDER — LORAZEPAM 2 MG/ML IJ SOLN: 1.0000 mg | INTRAMUSCULAR | Status: DC | PRN

## 2023-10-01 MED ORDER — LORAZEPAM 1 MG PO TABS
1.0000 mg | ORAL_TABLET | ORAL | Status: DC | PRN
  Administered 2023-10-01: 1 mg via ORAL

## 2023-10-01 MED ORDER — MELATONIN 3 MG PO TABS
3.0000 mg | ORAL_TABLET | Freq: Once | ORAL | Status: AC
  Administered 2023-10-02: 3 mg via ORAL
  Filled 2023-10-01: qty 1

## 2023-10-01 NOTE — Progress Notes (Signed)
 Patient had an episode during the 0400 hour. Verbally confrontational and aggressive. Patient was yelling and demanding to go home. Patient ripped gown off. Refused redirection. Uncooperative. Patient dressed herself. Hallucinations present. CIWA assessment 12. 2mg  Ativan  PO given. On reassessment and hour later, CIWA is 1. Patient is asleep. No adverse change in condition. Will continue to monitor.

## 2023-10-01 NOTE — Progress Notes (Signed)
 PROGRESS NOTE                                                                                                                                                                                                             Patient Demographics:    Terri Ayala, is a 68 y.o. female, DOB - Jul 08, 1955, FMW:[id number]  Outpatient Primary MD for the patient is [name]    LOS - 3  Admit date - 09/28/2023    Chief Complaint  Patient presents with   Chest Pain       Brief Narrative (HPI from H&P)    68 y.o. female with medical history significant of alcohol  use disorder, hypertension, hyperlipidemia, GERD, peripheral neuropathy p/w alcohol  withdrawal.   Pt unable to provide medical history. From what I can gather from telephone conversation with EDP and brief pt interview, Patient states that her last drink of alcohol  was approximately 5 to 6 PM last night. Patient states since then she has been experiencing muscle aches, tremors and this morning began experiencing substernal dull chest pain without radiation. Patient denies associated shortness of breath however does endorse 1 episode of nonbloody nonbilious emesis this a.m. Patient denies hematemesis, hematochezia, melena .   Of note, pt was recently admitted in 07/2023 with alcohol  withdrawal and encephalopathy at that time.     In the ED, pt tachycardic and tachypneic on RA c/w withdrawal. Labs notable for Na 147, and anion gap 24. CXR with active disease. EDP administered phenobarbital  x2 in ED and pt with ongoing withdrawal/tachycardia; thus, medicine admission to PCU requested.    Subjective:   Patient in bed, appears comfortable, denies any headache, no fever, no chest pain or pressure, no shortness of breath , no abdominal pain. No new focal weakness.   Assessment  & Plan :    Alcohol  withdrawal -IV/PO ativan  per CIWA protocol, DTs increased Librium , has been  hydrated with IV fluids,  strictly counseled to abstain from alcohol  abuse.  Continue to monitor, overall better, advance activity likely discharge in the next 1 to 2 days.   HTN -PTA Coreg  6.25mg  BID, add Norvasc  for better control   Mood disorder -PTA Wellbutrin  150mg  daily       Condition - Extremely Guarded  Family Communication  : None present  Code Status : Full code  Consults  : None  PUD Prophylaxis :  PPI   Procedures  :            Disposition Plan  :    Status is: Inpatient   DVT Prophylaxis  :    enoxaparin  (LOVENOX ) injection 40 mg Start: 09/28/23 1700    Lab Results  Component Value Date   PLT 171 09/30/2023    Diet :  Diet Order             Diet Heart Room service appropriate? Yes; Fluid consistency: Thin  Diet effective now                    Inpatient Medications  Scheduled Meds:  amLODipine   5 mg Oral Daily   buPROPion   150 mg Oral Daily   carvedilol   6.25 mg Oral BID WC   chlordiazePOXIDE   20 mg Oral TID   enoxaparin  (LOVENOX ) injection  40 mg Subcutaneous Q24H   folic acid   1 mg Oral Daily   melatonin  3 mg Oral Once   multivitamin with minerals  1 tablet Oral Daily   pantoprazole   40 mg Oral BID   thiamine   100 mg Oral Daily   Or   thiamine   100 mg Intravenous Daily   Continuous Infusions:  sodium chloride  Stopped (10/01/23 0909)   PRN Meds:.acetaminophen , HYDROcodone -acetaminophen , LORazepam  **OR** LORazepam   Antibiotics  :    Anti-infectives (From admission, onward)    None         Objective:   Vitals:   10/01/23 0013 10/01/23 0400 10/01/23 0500 10/01/23 0822  BP: 134/78 (!) 144/88 (!) 148/82 (!) 140/81  Pulse: 83 85 79 (!) 101  Resp: 17   15  Temp: 98.4 F (36.9 C)   98.5 F (36.9 C)  TempSrc: Oral   Oral  SpO2: 97%   97%  Weight:      Height:        Wt Readings from Last 3 Encounters:  09/28/23 55 kg  07/24/23 54.4 kg  05/01/23 57.6 kg     Intake/Output Summary (Last 24 hours) at  10/01/2023 0942 Last data filed at 10/01/2023 0900 Gross per 24 hour  Intake 240 ml  Output 800 ml  Net -560 ml     Physical Exam  Awake, in DTs,, No new F.N deficits, Normal affect [place].AT,PERRAL Supple Neck, No JVD,   Symmetrical Chest wall movement, Good air movement bilaterally, CTAB RRR,No Gallops,Rubs or new Murmurs,  +ve B.Sounds, Abd Soft, No tenderness,   No Cyanosis, Clubbing or edema       Data Review:    Recent Labs  Lab 09/28/23 1050 09/29/23 0856 09/30/23 0626  WBC 8.0 7.0 4.9  HGB 15.9* 12.6 11.1*  HCT 49.3* 38.1 33.8*  PLT PLATELET CLUMPS NOTED ON SMEAR, UNABLE TO ESTIMATE 210 171  MCV 92.7 90.3 91.6  MCH 29.9 29.9 30.1  MCHC 32.3 33.1 32.8  RDW 14.3 13.9 13.5  LYMPHSABS 1.9 1.4 1.3  MONOABS 0.7 0.5 0.4  EOSABS 0.1 0.0 0.1  BASOSABS 0.1 0.1 0.0    Recent Labs  Lab 09/28/23 1050 09/28/23 1412 09/29/23 0705 09/30/23 0626 09/30/23 1412 10/01/23 0551  NA 147*  --  139 140  --  137  K 4.4  --  3.7 3.5  --  3.8  CL 113*  --  108 108  --  105  CO2 10*  --  19* 19*  --  19*  ANIONGAP 24*  --  12 13  --  13  GLUCOSE 84  --  99 85  --  78  BUN 10  --  14 8  --  <5*  CREATININE 0.72  --  0.83 0.59  --  0.61  AST 29  --  19 20  --  32  ALT 12  --  9 9  --  8  ALKPHOS 94  --  68 71  --  80  BILITOT 1.1  --  1.5* 1.0  --  1.4*  ALBUMIN 3.9  --  3.2* 3.3*  --  3.5  CRP  --   --  <0.5  --   --   --   DDIMER  --  0.39  --   --   --   --   PROCALCITON  --   --  <0.10  --   --   --   AMMONIA  --   --  53*  --  16 35  MG 2.1  --  1.8 1.7  --  1.8  PHOS  --   --  3.2  --   --   --   CALCIUM  9.0  --  7.9* 8.2*  --  8.6*      Recent Labs  Lab 09/28/23 1050 09/28/23 1412 09/29/23 0705 09/30/23 0626 09/30/23 1412 10/01/23 0551  CRP  --   --  <0.5  --   --   --   DDIMER  --  0.39  --   --   --   --   PROCALCITON  --   --  <0.10  --   --   --   AMMONIA  --   --  53*  --  16 35  MG 2.1  --  1.8 1.7  --  1.8  CALCIUM  9.0  --  7.9* 8.2*  --  8.6*     --------------------------------------------------------------------------------------------------------------- Lab Results  Component Value Date   CHOL 193 04/22/2022   HDL 49 04/22/2022   LDLCALC 125 (H) 04/22/2022   TRIG 104 04/22/2022   CHOLHDL 3.9 04/22/2022    Lab Results  Component Value Date   HGBA1C 4.7 (L) 11/21/2020   No results for input(s): TSH, T4TOTAL, FREET4, T3FREE, THYROIDAB in the last 72 hours. No results for input(s): VITAMINB12, FOLATE, FERRITIN, TIBC, IRON, RETICCTPCT in the last 72 hours. ------------------------------------------------------------------------------------------------------------------ Cardiac Enzymes No results for input(s): CKMB, TROPONINI, MYOGLOBIN in the last 168 hours.  Invalid input(s): CK  Micro Results No results found for this or any previous visit (from the past 240 hours).  Radiology Report No results found.    Signature  -   Lavada Stank M.D on 10/01/2023 at 9:42 AM   -  To page go to [email or web address]

## 2023-10-02 ENCOUNTER — Other Ambulatory Visit (HOSPITAL_COMMUNITY): Payer: Self-pay

## 2023-10-02 DIAGNOSIS — F10139 Alcohol abuse with withdrawal, unspecified: Secondary | ICD-10-CM | POA: Diagnosis not present

## 2023-10-02 MED ORDER — AMLODIPINE BESYLATE 5 MG PO TABS
5.0000 mg | ORAL_TABLET | Freq: Every day | ORAL | 0 refills | Status: DC
  Filled 2023-10-02: qty 30, 30d supply, fill #0

## 2023-10-02 NOTE — Progress Notes (Signed)
 Discharge order noted, discharge rn at bedside. Transportation coordination in progress with home care agency. Pt agreeable to discharge plan. IV lines removed, fecal management tube removed, pt dressed. AVS printed/reviewed with the pt. States she manages meds independently, verbalizes understanding. TOC meds pending processing and p/u. Discharge lounge transport at the bedside taking pt to discharge lounge.

## 2023-10-02 NOTE — TOC CM/SW Note (Signed)
 Transition of Care [facility]) - Inpatient Brief Assessment   Patient Details  Name: Terri Ayala MRN: [id number] Date of Birth: 09-20-1955  Transition of Care [facility]) CM/SW Contact:    Inocente GORMAN Kindle, LCSW Phone Number: 10/02/2023, 9:10 AM   Clinical Narrative: Patient admitted from home with ETOH use. Community resources provided for patient. No further ICM needs identified at this time but please place consult if needed.    Transition of Care Asessment: Insurance and Status: Insurance coverage has been reviewed Patient has primary care physician: Yes Home environment has been reviewed: From home Prior level of function:: Independent Prior/Current Home Services: No current home services Social Drivers of Health Review: SDOH reviewed interventions complete Readmission risk has been reviewed: Yes Transition of care needs: no transition of care needs at this time

## 2023-10-02 NOTE — Discharge Summary (Signed)
 Terri Ayala FMW:[id number] DOB: 02/24/1956 DOA: 09/28/2023  PCP: Gayle Saddie FALCON, PA-C  Admit date: 09/28/2023  Discharge date: 10/02/2023  Admitted From: Home   Disposition:  Home   Recommendations for Outpatient Follow-up:   Follow up with PCP in 1-2 weeks  PCP Please obtain BMP/CBC, 2 view CXR in 1week,  (see Discharge instructions)   PCP Please follow up on the following pending results:    Home Health: None   Equipment/Devices: None  Consultations: None  Discharge Condition: Stable    CODE STATUS: Full    Diet Recommendation: Heart Healthy     Chief Complaint  Patient presents with   Chest Pain     Brief history of present illness from the day of admission and additional interim summary     68 y.o. female with medical history significant of alcohol  use disorder, hypertension, hyperlipidemia, GERD, peripheral neuropathy p/w alcohol  withdrawal.   Pt unable to provide medical history. From what I can gather from telephone conversation with EDP and brief pt interview, Patient states that her last drink of alcohol  was approximately 5 to 6 PM last night. Patient states since then she has been experiencing muscle aches, tremors and this morning began experiencing substernal dull chest pain without radiation. Patient denies associated shortness of breath however does endorse 1 episode of nonbloody nonbilious emesis this a.m. Patient denies hematemesis, hematochezia, melena .   Of note, pt was recently admitted in 07/2023 with alcohol  withdrawal and encephalopathy at that time.     In the ED, pt tachycardic and tachypneic on RA c/w withdrawal. Labs notable for Na 147, and anion gap 24. CXR with active disease. EDP administered phenobarbital  x2 in ED and pt with ongoing withdrawal/tachycardia; thus, medicine  admission to PCU requested.                                                                  Hospital Course   Alcohol  withdrawal  -was treated with Librium  along with CIWA protocol, DTs completely resolved, she was also adequately hydrated, currently symptom-free, walked with PT, will be discharged home, outpatient alcohol  abstaining resources provided.  PCP to monitor.   HTN  on Coreg  6.25mg  BID, added Norvasc  for better control   Mood disorder   -PTA Wellbutrin  150mg  daily    Discharge diagnosis     Active Problems:   Alcohol  withdrawal [facility])    Discharge instructions    Discharge Instructions     Diet - low sodium heart healthy   Complete by: As directed    Discharge instructions   Complete by: As directed    Follow with Primary MD Gayle Saddie FALCON, PA-C in 7 days   Get CBC, CMP, Magnesium   -  checked next visit with your primary MD   Activity: As  tolerated with Full fall precautions use walker/cane & assistance as needed  Disposition Home    Diet: Heart Healthy    Special Instructions: If you have smoked or chewed Tobacco  in the last 2 yrs please stop smoking, stop any regular Alcohol   and or any Recreational drug use.  On your next visit with your primary care physician please Get Medicines reviewed and adjusted.  Please request your Prim.MD to go over all Hospital Tests and Procedure/Radiological results at the follow up, please get all Hospital records sent to your Prim MD by signing hospital release before you go home.  If you experience worsening of your admission symptoms, develop shortness of breath, life threatening emergency, suicidal or homicidal thoughts you must seek medical attention immediately by calling 911 or calling your MD immediately  if symptoms less severe.  You Must read complete instructions/literature along with all the possible adverse reactions/side effects for all the Medicines you take and that have been prescribed to you. Take any new  Medicines after you have completely understood and accpet all the possible adverse reactions/side effects.   Do not drive when taking Pain medications.  Do not take more than prescribed Pain, Sleep and Anxiety Medications  Wear Seat belts while driving.   Increase activity slowly   Complete by: As directed        Discharge Medications   Allergies as of 10/02/2023       Reactions   Doxycycline Swelling   Mouth swelling and sores   Tetracycline Hcl Swelling, Other (See Comments)   Mouth swelling and sores   Codeine Itching        Medication List     STOP taking these medications    lidocaine  5 % Commonly known as: LIDODERM    oxyCODONE  5 MG immediate release tablet Commonly known as: Roxicodone    QUEtiapine  25 MG tablet Commonly known as: SEROQUEL        TAKE these medications    ADULT GUMMY PO Take 2 tablets by mouth daily.   albuterol  108 (90 Base) MCG/ACT inhaler Commonly known as: VENTOLIN  HFA Inhale 2 puffs into the lungs every 6 (six) hours as needed for wheezing or shortness of breath.   alendronate  70 MG tablet Commonly known as: FOSAMAX  Take 70 mg by mouth every Monday.   amLODipine  5 MG tablet Commonly known as: NORVASC  Take 1 tablet (5 mg total) by mouth daily. Start taking on: October 03, 2023   baclofen  10 MG tablet Commonly known as: LIORESAL  Take 1 tablet (10 mg total) by mouth 3 (three) times daily.   buPROPion  150 MG 24 hr tablet Commonly known as: Wellbutrin  XL Take 1 tablet (150 mg total) by mouth every morning.   busPIRone  15 MG tablet Commonly known as: BUSPAR  Take 1 tablet (15 mg total) by mouth 3 (three) times daily.   CALCIUM  600+D PO Take 1 tablet by mouth daily.   carvedilol  6.25 MG tablet Commonly known as: COREG  TAKE 1 TABLET BY MOUTH 2 TIMES DAILY WITH A MEAL.   DULoxetine  60 MG capsule Commonly known as: CYMBALTA  Take 60 mg by mouth 2 (two) times daily.   feeding supplement Liqd Take 237 mLs by mouth 3 (three)  times daily between meals.   fluticasone  50 MCG/ACT nasal spray Commonly known as: FLONASE  Place 2 sprays into both nostrils daily as needed for allergies.   folic acid  1 MG tablet Commonly known as: FOLVITE  TAKE 1 TABLET BY MOUTH DAILY.   gabapentin  300 MG capsule Commonly  known as: NEURONTIN  Take 1 capsule (300 mg total) by mouth 2 (two) times daily.   hydrOXYzine  25 MG tablet Commonly known as: ATARAX  Take 25 mg by mouth 3 (three) times daily.   meloxicam  15 MG tablet Commonly known as: MOBIC  Take 15 mg by mouth daily.   NON FORMULARY Take 5 mg by mouth daily. Fyavolv . Hormone replacement   NON FORMULARY Testosterone  20 mg/51ml. Use small pea-sized amount once a week as directed   norethindrone -ethinyl estradiol  1-5 MG-MCG Tabs tablet Commonly known as: FEMHRT  1/5 Take 1 tablet by mouth daily.   pantoprazole  40 MG tablet Commonly known as: PROTONIX  TAKE 1 TABLET BY MOUTH 2 TIMES DAILY.   propranolol  10 MG tablet Commonly known as: INDERAL  Take 1-2 tablets at onset of panic no more than 2 times daily   SUMAtriptan  100 MG tablet Commonly known as: IMITREX  TAKE 1 TABLET BY MOUTH AS NEEDED FOR MIGRAINE. MAY REPEAT IN 2 HOURS IF HEADACHE PERSISTS OR RECURS. MAX 2 TABLETS PER DAY   thiamine  100 MG tablet Commonly known as: VITAMIN B1 Take 100 mg by mouth daily.   traZODone  50 MG tablet Commonly known as: DESYREL  Take 50 mg by mouth at bedtime.   Voltaren  1 % Gel Generic drug: diclofenac  Sodium Apply topically 4 (four) times daily.         Follow-up Information     Gayle Saddie FALCON, [place]. Schedule an appointment as soon as possible for a visit in 1 week(s).   Specialty: Physician Assistant Contact information: [address] [place] [address] [phone]                 Major procedures and Radiology Reports - PLEASE review detailed and final reports thoroughly  -     DG Chest [facility] 1 View Result Date: 09/29/2023 CLINICAL DATA:   68 year old female with chest pain and shortness of breath. EXAM: PORTABLE CHEST 1 VIEW COMPARISON:  Portable chest yesterday and earlier. FINDINGS: Portable AP semi upright view at 0709 hours. Diffuse bilateral rib fractures, most appear healed and sclerotic as before. Lung volumes are stable at the upper limits of normal to mildly hyperinflated. Normal cardiac size and mediastinal contours. Visualized tracheal air column is within normal limits. Allowing for portable technique the lungs are clear. No pneumothorax or pleural effusion. Paucity of bowel gas. IMPRESSION: 1.  No acute cardiopulmonary abnormality. 2. Numerous nonacute bilateral rib fractures. Electronically Signed   By: VEAR Hurst M.D.   On: 09/29/2023 07:53   DG Chest Portable 1 View Result Date: 09/28/2023 CLINICAL DATA:  Chest pain. EXAM: PORTABLE CHEST 1 VIEW COMPARISON:  None Available. FINDINGS: No focal consolidation, pleural effusion, pneumothorax. The cardiac silhouette is within limits. No acute osseous pathology. Old bilateral rib fractures. IMPRESSION: No active disease. Electronically Signed   By: Vanetta Chou M.D.   On: 09/28/2023 10:48   CT Cervical Spine Wo Contrast Result Date: 09/07/2023 CLINICAL DATA:  Neck trauma (Age >= 65y) EXAM: CT CERVICAL SPINE WITHOUT CONTRAST TECHNIQUE: Multidetector CT imaging of the cervical spine was performed without intravenous contrast. Multiplanar CT image reconstructions were also generated. RADIATION DOSE REDUCTION: This exam was performed according to the departmental dose-optimization program which includes automated exposure control, adjustment of the mA and/or kV according to patient size and/or use of iterative reconstruction technique. COMPARISON:  CT of the cervical spine dated Jul 24, 2023. FINDINGS: Alignment: Interval development of 4 mm of anterolisthesis at C5-6. There is a mild dextrocurvature of the  cervical spine. Skull base and vertebrae: No acute fracture is evident. The right  facets at C2-3 are fused and the left facets at C3-4 and C4-5 are fused. No osseous lesions are present. Soft tissues and spinal canal: No paraspinous hematoma or soft tissue swelling present. Bilateral thyroid  nodules, which have been previously assessed and appear unchanged in the interim. Disc levels: There is new degenerative anterolisthesis sat C5-6, but there is no apparent significant spinal canal or neural foraminal stenosis. There is chronic degenerative disc disease at C6-7, also without significant spinal canal or neural foraminal stenosis. Upper chest: Negative. Other: None. IMPRESSION: 1. There is new mild anterolisthesis at C5-6, where there is marked left-sided facet arthrosis. There is no obvious osseous or soft tissue injury, but the soft tissues could be more definitively assessed with MRI, if ligamentous injury is clinically suspected. Flexion and extension lateral radiographs of the cervical spine might also be considered to assess for instability. Electronically Signed   By: Evalene Coho M.D.   On: 09/07/2023 11:45   CT Head Wo Contrast Result Date: 09/07/2023 CLINICAL DATA:  Provided history: Head trauma, minor. EXAM: CT HEAD WITHOUT CONTRAST TECHNIQUE: Contiguous axial images were obtained from the base of the skull through the vertex without intravenous contrast. RADIATION DOSE REDUCTION: This exam was performed according to the departmental dose-optimization program which includes automated exposure control, adjustment of the mA and/or kV according to patient size and/or use of iterative reconstruction technique. COMPARISON:  Head CT 07/24/2023. FINDINGS: Brain: Generalized cerebral atrophy. Patchy and ill-defined hypoattenuation within the cerebral white matter, nonspecific but compatible with mild chronic small vessel ischemic disease. Unchanged subcentimeter hyperdense focus within the left cerebellar hemisphere, suspicious for a cavernoma (when correlating with the prior brain  MRI of 06/23/2022). There is no acute intracranial hemorrhage. No demarcated cortical infarct. No extra-axial fluid collection. No evidence of an intracranial mass. No midline shift. Vascular: No hyperdense vessel. Atherosclerotic calcifications. Skull: No calvarial fracture or aggressive osseous lesion. Sinuses/Orbits: No mass or acute finding within the imaged orbits. No significant paranasal sinus disease at the imaged levels. IMPRESSION: 1.  No evidence of an acute intracranial abnormality. 2. Unchanged subcentimeter hyperdense focus within the left cerebellar hemisphere, suspicious for a cavernoma (when correlating with the prior brain MRI of 06/23/2022). 3. Parenchymal atrophy and chronic small vessel ischemic disease. Electronically Signed   By: Rockey Childs D.O.   On: 09/07/2023 11:02   DG Ankle Complete Right Result Date: 09/07/2023 CLINICAL DATA:  RIGHT ankle pain EXAM: RIGHT ANKLE - COMPLETE 3+ VIEW COMPARISON:  None Available. FINDINGS: Ankle mortise intact. The talar dome is normal. No malleolar fracture. The calcaneus is normal. IMPRESSION: No fracture or dislocation. Electronically Signed   By: Jackquline Boxer M.D.   On: 09/07/2023 10:07    Micro Results    No results found for this or any previous visit (from the past 240 hours).  Today   Subjective    Luke Nett today has no headache,no chest abdominal pain,no new weakness tingling or numbness, feels much better wants to go home today.    Objective   Blood pressure (!) 146/87, pulse 90, temperature 98.3 F (36.8 C), temperature source Oral, resp. rate 15, height 5' 2 (1.575 m), weight 55 kg, SpO2 100%.   Intake/Output Summary (Last 24 hours) at 10/02/2023 1026 Last data filed at 10/01/2023 1800 Gross per 24 hour  Intake 300 ml  Output 700 ml  Net -400 ml    Exam  Awake Alert,  No new F.N deficits,   no tremors, no DTs currently [place].AT,PERRAL Supple Neck,   Symmetrical Chest wall movement, Good air movement  bilaterally, CTAB RRR,No Gallops,   +ve B.Sounds, Abd Soft, Non tender,  No Cyanosis, Clubbing or edema    Data Review   Recent Labs  Lab 09/28/23 1050 09/29/23 0856 09/30/23 0626  WBC 8.0 7.0 4.9  HGB 15.9* 12.6 11.1*  HCT 49.3* 38.1 33.8*  PLT PLATELET CLUMPS NOTED ON SMEAR, UNABLE TO ESTIMATE 210 171  MCV 92.7 90.3 91.6  MCH 29.9 29.9 30.1  MCHC 32.3 33.1 32.8  RDW 14.3 13.9 13.5  LYMPHSABS 1.9 1.4 1.3  MONOABS 0.7 0.5 0.4  EOSABS 0.1 0.0 0.1  BASOSABS 0.1 0.1 0.0    Recent Labs  Lab 09/28/23 1050 09/28/23 1412 09/29/23 0705 09/30/23 0626 09/30/23 1412 10/01/23 0551  NA 147*  --  139 140  --  137  K 4.4  --  3.7 3.5  --  3.8  CL 113*  --  108 108  --  105  CO2 10*  --  19* 19*  --  19*  ANIONGAP 24*  --  12 13  --  13  GLUCOSE 84  --  99 85  --  78  BUN 10  --  14 8  --  <5*  CREATININE 0.72  --  0.83 0.59  --  0.61  AST 29  --  19 20  --  32  ALT 12  --  9 9  --  8  ALKPHOS 94  --  68 71  --  80  BILITOT 1.1  --  1.5* 1.0  --  1.4*  ALBUMIN 3.9  --  3.2* 3.3*  --  3.5  CRP  --   --  <0.5  --   --   --   DDIMER  --  0.39  --   --   --   --   PROCALCITON  --   --  <0.10  --   --   --   AMMONIA  --   --  53*  --  16 35  MG 2.1  --  1.8 1.7  --  1.8  PHOS  --   --  3.2  --   --   --   CALCIUM  9.0  --  7.9* 8.2*  --  8.6*    Total Time in preparing paper work, data evaluation and todays exam - 35 minutes  Signature  -    Lavada Stank M.D on 10/02/2023 at 10:26 AM   -  To page go to [email or web address]

## 2023-10-02 NOTE — Progress Notes (Signed)
 Physical Therapy Treatment Patient Details Name: Terri Ayala MRN: [id number] DOB: 02-14-1956 Today's Date: 10/02/2023   History of Present Illness 68 y.o. female adm 5/26 with medical history significant of depression, anxiety, PTSD, heavy alcohol  use, hypertension neuropathy migraines.  Presented after a fall at home down 5 steps.  Imaging reveals Acute displaced fracture of the posterior left eighth rib.    PT Comments  Pt tolerated treatment well today. Pt today was able to ambulate in hallway with RW at supervision level. Pt reports that she feels she is at her baseline for mobility. No change in DC/DME recs at this time. Pt anticipates DC home today. PT will continue to follow.     If plan is discharge home, recommend the following: A little help with walking and/or transfers;A little help with bathing/dressing/bathroom;Assistance with cooking/housework;Assist for transportation;Help with stairs or ramp for entrance;Supervision due to cognitive status   Can travel by private vehicle        Equipment Recommendations  None recommended by PT    Recommendations for Other Services       Precautions / Restrictions Precautions Precautions: Fall Recall of Precautions/Restrictions: Impaired Precaution/Restrictions Comments: Rectal tube Restrictions Weight Bearing Restrictions Per Provider Order: No     Mobility  Bed Mobility Overal bed mobility: Modified Independent Bed Mobility: Supine to Sit, Sit to Supine     Supine to sit: Modified independent (Device/Increase time) Sit to supine: Modified independent (Device/Increase time)   General bed mobility comments: no physical assistance needed. Pt reliant on momentum.    Transfers Overall transfer level: Modified independent Equipment used: Rolling walker (2 wheels) Transfers: Sit to/from Stand Sit to Stand: Modified independent (Device/Increase time)                Ambulation/Gait Ambulation/Gait assistance:  Supervision Gait Distance (Feet): 400 Feet Assistive device: Rolling walker (2 wheels) Gait Pattern/deviations: WFL(Within Functional Limits) Gait velocity: WFL     General Gait Details: no LOB noted.   Stairs             Wheelchair Mobility     Tilt Bed    Modified Rankin (Stroke Patients Only)       Balance Overall balance assessment: No apparent balance deficits (not formally assessed)                                          Communication Communication Communication: Impaired  Cognition Arousal: Lethargic, Alert Behavior During Therapy: Flat affect   PT - Cognitive impairments: Problem solving, Safety/Judgement, Sequencing, Difficult to assess Difficult to assess due to: Hard of hearing/deaf                     PT - Cognition Comments: Pt followed commands however was noted to be very HOH. Following commands: Intact      Cueing Cueing Techniques: Verbal cues, Tactile cues  Exercises      General Comments General comments (skin integrity, edema, etc.): VSS      Pertinent Vitals/Pain Pain Assessment Pain Assessment: No/denies pain    Home Living                          Prior Function            PT Goals (current goals can now be found in the care plan section) Progress towards PT goals:  Progressing toward goals    Frequency    Min 2X/week      PT Plan      Co-evaluation              AM-PAC PT 6 Clicks Mobility   Outcome Measure  Help needed turning from your back to your side while in a flat bed without using bedrails?: None Help needed moving from lying on your back to sitting on the side of a flat bed without using bedrails?: None Help needed moving to and from a bed to a chair (including a wheelchair)?: None Help needed standing up from a chair using your arms (e.g., wheelchair or bedside chair)?: None Help needed to walk in hospital room?: A Little Help needed climbing 3-5 steps  with a railing? : A Little 6 Click Score: 22    End of Session Equipment Utilized During Treatment: Gait belt Activity Tolerance: Patient tolerated treatment well Patient left: in bed;with call bell/phone within reach Nurse Communication: Mobility status PT Visit Diagnosis: Other abnormalities of gait and mobility (R26.89)     Time: 9050-8998 PT Time Calculation (min) (ACUTE ONLY): 12 min  Charges:    $Gait Training: 8-22 mins PT General Charges $$ ACUTE PT VISIT: 1 Visit                     Sueellen NOVAK, PT, DPT Acute Rehab Services [phone]    Tyresse Jayson 10/02/2023, 10:11 AM

## 2023-10-02 NOTE — Plan of Care (Signed)
 Patient was able to ambulate from the bed to the [facility] with no problems. Appropriate behaviors. No adverse change in condition. Will continue to monitor.

## 2023-10-02 NOTE — Discharge Instructions (Addendum)
 In a time of Crisis: [facility]  Mobile Crisis Management provides immediate crisis response, 24/7.  Call [phone]  [facility] for MH/DD/[facility] is available 24 hours a day, 7 days a week. Customer Service Specialists will assist you to find a crisis provider that is well-matched with your needs. Your local number is: [phone]  [facility] ([facility]) IOP, individual counseling, medication management [address] [place], [place] [address] [phone] Call for intake hours; Medicaid and Uninsured    Substance Use Outpatient Providers  [facility] ([facility]) Group and individual counseling. [address]  [place], [place] [address] [phone] [place]: [phone]  [place]: [phone] Medicaid and uninsured.   [facility] Offers IOP groups multiple times per week. [address] [place], [place] [address] [phone] Takes Medicaid and other insurances.   [facility]  Chemical Dependency Intensive Outpatient Program (IOP) [address] [place], [place] [address] [phone] Takes Nurse, learning disability and [place].   [facility]  IOP and Partial Hospitalization Program  [address]  [place], [place] [address] [phone] Private Insurance, [place] only for partial hospitalization  [facility] [address], [address], [place], [place] [address] [phone] Monday-Friday. Short and Long term options.  [facility] ([facility]) IOP, individual counseling, medication management [address] [place], [place] [address] [phone] Medicaid and [facility]  [facility] [address]  [place], [place] [address] [phone] Private Insurance and Self Pay   [facility] Outpatient [address]  [place], [place] [address] [phone] Private Insurance, [place], and Self Pay   [facility]  [address] [place], [place] [address] [facility]  [address]  [place], [place] [address] [phone]  [facility]  [address] [place], [place] [address] [phone]      Residential Treatment Programs  [facility] ([facility] [address] [place], [place] [address] [phone] or [phone] Detox and Residential Rehab 21 days (Medicaid, private insurance, and self pay. If Medicare, will look into funding). No methadone. Call for pre-screen.   [facility]) [address]  [place], [place] [address] [phone] Detox 3-7 days (self Pay and Medicaid Limited availability). Transitional Program for females needs 60 days clean first.  Rehab Only for Males (Medicare, Medicaid, and Self Pay)-No methadone.  [facility] [address] [place], [place] [address] [phone] or [phone] Private Insurance only  [facility] PHONE: [phone] FAX: [phone] Residential program for women 21 and over for up to a year through a Christian 12-step recovery model. Self-pay.    [facility] [address] [place], [place] [address] Phone:  [phone] Must be detoxed 72 hours prior to admission; 28 day program.  Self-pay.  [facility] [address]  [place], [place] [phone] [employer], Medicare, [place] (not straight [place]). They offer assistance with transportation.   [facility] [address] [place],  [place], [place] [address] [phone] Christian Based Program. Men only. No insurance  [employer] is a substance use disorder treatment program for women, including those who are pregnant, parenting, and/or whose lives have been touched by abuse and violence. (800)  [phone]  [facility] [address]  [place], [place] [address] Women's: [phone] Men's: [phone] No Medicaid.   [facility] Locations across the [place] (mainly [place] ) willing to help with transportation.  [phone] [employer]. [facility]  [address]  [place], [place] [address] [phone] Treatment Only, must make assessment appointment, and must be sober for assessment appointment. Self pay, [facility], must be [facility] resident. No methadone.   [facility]  [address] [place], [place] [address] [phone] No pending legal charges, Long-term work program. No methadone. Call for assessment.  [facility]  [address] [place], [place] [address] [phone] or [phone] Commercial Insurance Only  [facility] - [phone] Private Insurance (no [place]). Males/Females, call to make referrals, multiple facilities.   [facility]: [facility] [address] [place], [place] [address] [phone]  [facility]-no methadone: [facility] [address] [place], [place] [address] [phone] [facility] [phone] [place], [place] For women, houses 8 residents for sober living. No Medicaid.         AA Meetings Website to locate meetings (virtually or in person): [email or web address].young.biz/ Phone: [phone]  Syringe Services Program: Due to COVID-19, syringe services programs are likely operating under different hours with limited or no fixed site hours. Some programs may not be operating at all. Please contact the program directly using the phone numbers provided below to see if they are still operating under COVID-19. [facility] Solution to the Opioid Problem (GCSTOP) Fixed;  mobile; peer-based;Norman Cummins) [phone] [email or web address] Fixed site exchange at [facility], [address] [place], [place] [address] on Wednesdays (2:00 - 5:00 pm) and Thursdays (4:00 - 8:00 pm). Pop-up mobile exchange locations: [employer] and [employer], [address] [place], [place] [address] on Tuesdays (11:00 am - 1:00 pm) and Fridays (11:00 am - 1:00 pm) -[facility] - [address], [place], [place] [address] on Tuesdays (2:00 - 4:00 pm) and Fridays (2:00 - 4:00 pm) -[employer] copy - also serves Radio broadcast assistant and [employer]  [employer];Fixed; mobile; peer-based; Velia Specking [phone] [email or web address] [address] [place], [place] [address] Delivery and outreach available in [place] and [place], please call for more information. Monday, Tuesday: 1:00 -7:00 pm, Thursday: 4:00 pm - 8:00 pm, Friday: 1:00 pm - 8:00 pm)  Medication-Assisted Treatment (MAT):  -[place] [facility]- services [address] and surrounding areas including [place], [place], [place], [place], [address], [place], [place], [place], [place], and [place], [place]. Options include Methadone, buprenorphine or Suboxone. [address] [place], [place] [address] Phone: [phone] Mon - Fri: 5:30am - 2:00pm Sat: 5:30am -7:30am Sun: Closed Holidays: 6:00am - 8:00am  -[facility]- We use FDA-approved medications, like methadone/suboxone/sublocade, and vivitrol. These medications are then combined with customized care plans that include individual or group counseling, toxicology, and medical care directed by on-site physicians. Accepts most insurance plans, Medicaid, and private pay.  [address] [place], [place] [address] Phone: [phone] Monday-Friday 5:00 AM - 10:00 AM Saturday 6:00 AM - 8:30 AM Sunday 6:00 AM - 7:00 AM  -[facility]- [facility] is a treatment & recovery focused  program. In addition to receiving methadone medication, our clients participate in individual and group counseling as well as random drug testing. If accepted into the [facility] Opioid Program, you will be provided several intake appointments and a physical exam  [address], [place] [address] Office: [phone]  Fax: [phone]  -[facility]- We put our community members at the center of everything we do, for remote treatment services as well as in-person, from alcohol  withdrawal to opioid use and more.  [address], [place], [place] [address] [phone] Monday-Wednesday: 9:00am - 5:00pm Thursday: 9:00am - 6:00pm Friday: 9:00am - 5:00pm Saturday: 9:00am - 1:00pm Sunday: Closed  -[facility] [place]) [address] [place], [place] [address] [phone]  [place] [phone] [address] [place], [place] [address]  M-W    5:00am-12:00pm Thu     5:00am-10:00am Fri       5:00am-12:00pm Sat      5:00am-8:00am Sun     Closed  $12/daily for Methadone Treatment.    To address social isolation:  Education officer, [facility] of [place]: [phone] / [address] [place], [place] [address]  -Dial 988: Talk lifeline 24/7.  -[place]  - [facility] Warmline: [phone]    Follow with Primary MD Gayle Saddie FALCON, PA-C in 7 days   Get CBC, CMP, Magnesium   -  checked next visit with your primary MD   Activity: As tolerated with Full fall precautions use walker/cane & assistance as needed  Disposition Home    Diet: Heart Healthy    Special Instructions: If you have smoked or chewed Tobacco  in the last 2 yrs please stop smoking, stop any regular Alcohol   and or any Recreational drug use.  On your next visit with your primary care physician please Get Medicines reviewed and adjusted.  Please request your Prim.MD to go over all Hospital Tests and Procedure/Radiological results at the follow up,  please get all Hospital records sent to your Prim MD by signing hospital release before you go home.  If you experience worsening of your admission symptoms, develop shortness of breath, life threatening emergency, suicidal or homicidal thoughts you must seek medical attention immediately by calling 911 or calling your MD immediately  if symptoms less severe.  You Must read complete instructions/literature along with all the possible adverse reactions/side effects for all the Medicines you take and that have been prescribed to you. Take any new Medicines after you have completely understood and accpet all the possible adverse reactions/side effects.   Do not drive when taking Pain medications.  Do not take more than prescribed Pain, Sleep and Anxiety Medications  Wear Seat belts while driving.

## 2023-10-03 ENCOUNTER — Telehealth: Payer: Self-pay

## 2023-10-03 NOTE — Transitions of Care (Post Inpatient/ED Visit) (Unsigned)
   10/03/2023  Name: Terri Ayala MRN: [id number] DOB: 06-May-1955  Today's TOC FU Call Status: Today's TOC FU Call Status:: Unsuccessful Call (1st Attempt) Unsuccessful Call (1st Attempt) Date: 10/03/23  Attempted to reach the patient regarding the most recent Inpatient/ED visit.  Follow Up Plan: Additional outreach attempts will be made to reach the patient to complete the Transitions of Care (Post Inpatient/ED visit) call.   Signature Julian Lemmings, LPN [facility] Nurse Health Advisor Direct Dial [phone]

## 2023-10-04 NOTE — Care Management Important Message (Signed)
 Important Message  Patient Details  Name: Terri Ayala MRN: [id number] Date of Birth: May 31, 1955   Important Message Given:  Yes - Medicare IM   Patient left prior to IM delivery will mail a copy to the patient home address  [name] 10/04/2023, 11:39 AM

## 2023-10-04 NOTE — Transitions of Care (Post Inpatient/ED Visit) (Unsigned)
   10/04/2023  Name: Terri Ayala MRN: [id number] DOB: February 21, 1956  Today's TOC FU Call Status: Today's TOC FU Call Status:: Unsuccessful Call (2nd Attempt) Unsuccessful Call (1st Attempt) Date: 10/03/23 Unsuccessful Call (2nd Attempt) Date: 10/04/23  Attempted to reach the patient regarding the most recent Inpatient/ED visit.  Follow Up Plan: Additional outreach attempts will be made to reach the patient to complete the Transitions of Care (Post Inpatient/ED visit) call.   Signature Julian Lemmings, LPN [facility] Nurse Health Advisor Direct Dial [phone]

## 2023-10-05 NOTE — Transitions of Care (Post Inpatient/ED Visit) (Signed)
   10/05/2023  Name: Terri Ayala MRN: [id number] DOB: 1955-09-09  Today's TOC FU Call Status: Today's TOC FU Call Status:: Unsuccessful Call (3rd Attempt) Unsuccessful Call (1st Attempt) Date: 10/03/23 Unsuccessful Call (2nd Attempt) Date: 10/04/23 Unsuccessful Call (3rd Attempt) Date: 10/05/23  Attempted to reach the patient regarding the most recent Inpatient/ED visit.  Follow Up Plan: No further outreach attempts will be made at this time. We have been unable to contact the patient.  Signature Julian Lemmings, LPN [facility] Nurse Health Advisor Direct Dial [phone]

## 2023-10-12 ENCOUNTER — Encounter (HOSPITAL_COMMUNITY): Payer: Self-pay

## 2023-10-12 ENCOUNTER — Telehealth (HOSPITAL_BASED_OUTPATIENT_CLINIC_OR_DEPARTMENT_OTHER): Admitting: Medical

## 2023-10-12 ENCOUNTER — Ambulatory Visit (HOSPITAL_COMMUNITY): Admitting: Medical

## 2023-10-12 ENCOUNTER — Encounter (HOSPITAL_COMMUNITY): Payer: Self-pay | Admitting: Medical

## 2023-10-12 DIAGNOSIS — Z62819 Personal history of unspecified abuse in childhood: Secondary | ICD-10-CM

## 2023-10-12 DIAGNOSIS — Z79899 Other long term (current) drug therapy: Secondary | ICD-10-CM

## 2023-10-12 DIAGNOSIS — F4320 Adjustment disorder, unspecified: Secondary | ICD-10-CM

## 2023-10-12 DIAGNOSIS — L718 Other rosacea: Secondary | ICD-10-CM

## 2023-10-12 DIAGNOSIS — Z8659 Personal history of other mental and behavioral disorders: Secondary | ICD-10-CM

## 2023-10-12 DIAGNOSIS — E8889 Other specified metabolic disorders: Secondary | ICD-10-CM

## 2023-10-12 DIAGNOSIS — Z9889 Other specified postprocedural states: Secondary | ICD-10-CM

## 2023-10-12 DIAGNOSIS — Z8781 Personal history of (healed) traumatic fracture: Secondary | ICD-10-CM

## 2023-10-12 DIAGNOSIS — F1291 Cannabis use, unspecified, in remission: Secondary | ICD-10-CM

## 2023-10-12 DIAGNOSIS — Z8669 Personal history of other diseases of the nervous system and sense organs: Secondary | ICD-10-CM

## 2023-10-12 DIAGNOSIS — F4312 Post-traumatic stress disorder, chronic: Secondary | ICD-10-CM

## 2023-10-12 DIAGNOSIS — F1020 Alcohol dependence, uncomplicated: Secondary | ICD-10-CM

## 2023-10-12 DIAGNOSIS — K709 Alcoholic liver disease, unspecified: Secondary | ICD-10-CM

## 2023-10-12 NOTE — Progress Notes (Signed)
 Virtual Visit via Video Note  I connected with Terri Ayala on 10/12/23 at  3:30 PM EDT by a video enabled telemedicine application and verified that I am speaking with the correct person using two identifiers.  Location: Patient: Home Provider: [facility] [place]   I discussed the limitations of evaluation and management by telemedicine and the availability of in person appointments. The patient expressed understanding and agreed to proceed.  You saw Carlin Emmer on Thursday October 12, 2023 for: Follow-up Alcohol  Problem Trauma Stress Anxiety Depression  History of Present Illness:  Pt seen virtually 7?17/2025::  HPI: Terri Ayala is finally seen after multiple attempts for FU failed to reach her.She was last seen on 05/25/2023  05/25/2023  important  Sensitive  Relapse into alcoholic drinkin    HPI; Pt is seen Virtually having failed to FU from appointment 02/16/2023:   On 05/01/2023 patient saw PCP and admiited return to use. Review of EPIC revealed 2 phone calls to [name] LCSW CDIOP Counselor 1/08 and 03/17/2023 requesting return to IOP but she did not return his calls PCP contacted this provider who advised Detox and referral to Counselor [place].  Today Terri Ayala reports her last drink was last nite.When asked what she wanted to do at this point she was willing to do short term inpt but not residential treatment.sHE SAYS tHE HOLIDAYS GOT ME DOWN   Assessment: Alcohol  Use Disorder Severe Dependence-relapse requires medically supervised withdrawal   and Plan:  Pt agrees to contact PCP for admission to [facility] detox unit Pt to have hospital contact [name] LCSW [facility] IOP Counselor for consideration of aftercare/return to [facility]   Carlin Emmer, PA-C  She called earlier requesting Telepsych visit as she forgot I wanted her to come in person. Today she tells me she is 2 weeks abstinent and that she did go to detox but they found a heart problem also that kept her  there a few days' She says she was 2 months sober when her brother died and that tipped her over into drinking again but that after a few days she was already experiencing withdrawal and knew she needed to go to the hospital. No history of more falls She has a caregiver and transportation benefits but only Mon Weds and Fri.  Observations/Objective: Care Everywhere Reviewed  Poor image but sound OK.-occasional [employer] disconnects She does not appear to be intoxicated.  Her speech is clear and comprehensible at a normal rate. She is fully oriented Her Thoughts are logical, goal directed and associations intact. Memory present   Assessment and Plan:  She appears to be detoxified. She has medications from last visit She will arrange to come Monday Sept 8 for in person visit   Follow Up Instructions:    I discussed the assessment and treatment plan with the patient. The patient was provided an opportunity to ask questions and all were answered. The patient agreed with the plan and demonstrated an understanding of the instructions.   The patient was advised to call back or seek an in-person evaluation if the symptoms worsen or if the condition fails to improve as anticipated.  I provided 15 minutes of non-face-to-face time during this encounter.   Carlin Emmer, PA-C

## 2023-10-18 ENCOUNTER — Telehealth: Payer: Self-pay

## 2023-10-18 NOTE — Telephone Encounter (Signed)
   Pre-operative Risk Assessment    Patient Name: Terri Ayala  DOB: January 17, 1956 MRN: [id number]   Date of last office visit: 04/22/22 LONNI CASH, MD Date of next office visit: NONE   Request for Surgical Clearance    Procedure:  RT TOTAL HIP ARTHROPLASTY  Date of Surgery:  Clearance TBD                                Surgeon:  TORIBIO HIGASHI, MD Surgeon's Group or Practice Name:  [facility] Phone number:  [phone]  EXT [phone] Fax number:  [phone]   ATTN: KELLY HIGH   Type of Clearance Requested:   - Medical    Type of Anesthesia:  Spinal   Additional requests/questions:    SignedLucie DELENA Ku   10/18/2023, 8:59 AM

## 2023-10-18 NOTE — Telephone Encounter (Signed)
   Name: Terri Ayala  DOB: 1955/07/20  MRN: [id number]  Primary Cardiologist: Lonni Cash, MD   Preoperative team, please contact this patient and set up a phone call appointment for further preoperative risk assessment. Please obtain consent and complete medication review. Thank you for your help.  I confirm that guidance regarding antiplatelet and oral anticoagulation therapy has been completed and, if necessary, noted below.  Patient is not on anticoagulation or antiplatelet per review of medical record in Epic.    I also confirmed the patient resides in the state of [place] . As per [facility] Medical Board telemedicine laws, the patient must reside in the state in which the provider is licensed.   Lamarr Satterfield, NP 10/18/2023, 10:38 AM [facility]

## 2023-10-18 NOTE — Telephone Encounter (Signed)
 No answer - will try again later

## 2023-10-19 ENCOUNTER — Ambulatory Visit (INDEPENDENT_AMBULATORY_CARE_PROVIDER_SITE_OTHER)

## 2023-10-19 VITALS — BP 113/70 | HR 82 | Temp 97.8°F | Ht 62.0 in | Wt 140.0 lb

## 2023-10-19 DIAGNOSIS — Z01818 Encounter for other preprocedural examination: Secondary | ICD-10-CM | POA: Diagnosis not present

## 2023-10-19 DIAGNOSIS — Z0000 Encounter for general adult medical examination without abnormal findings: Secondary | ICD-10-CM

## 2023-10-19 DIAGNOSIS — G621 Alcoholic polyneuropathy: Secondary | ICD-10-CM | POA: Diagnosis not present

## 2023-10-19 DIAGNOSIS — F1020 Alcohol dependence, uncomplicated: Secondary | ICD-10-CM | POA: Diagnosis not present

## 2023-10-19 DIAGNOSIS — Z131 Encounter for screening for diabetes mellitus: Secondary | ICD-10-CM | POA: Diagnosis not present

## 2023-10-19 DIAGNOSIS — I10 Essential (primary) hypertension: Secondary | ICD-10-CM | POA: Diagnosis not present

## 2023-10-19 LAB — POCT URINALYSIS DIPSTICK
Bilirubin, UA: NEGATIVE
Glucose, UA: NEGATIVE
Ketones, UA: NEGATIVE
Nitrite, UA: NEGATIVE
Protein, UA: POSITIVE — AB
Spec Grav, UA: >=1.03 — AB (ref 1.010–1.025)
Urobilinogen, UA: 0.2 U/dL
pH, UA: 6 (ref 5.0–8.0)

## 2023-10-19 LAB — POCT GLYCOSYLATED HEMOGLOBIN (HGB A1C): Hemoglobin A1C: 5.3 % (ref 4.0–5.6)

## 2023-10-19 MED ORDER — GABAPENTIN 300 MG PO CAPS: 300.0000 mg | ORAL_CAPSULE | Freq: Three times a day (TID) | ORAL | 3 refills | Status: DC

## 2023-10-19 NOTE — Telephone Encounter (Signed)
 2nd attempt : Called patient regarding scheduling preop telehealth appt.

## 2023-10-19 NOTE — Patient Instructions (Signed)
 It was nice to see you today!  As we discussed in clinic:  -We have increased your gabapentin  to 300 mg three time daily instead of twice daily.  -I have signed off on your surgical clearance.  - Keep your follow up with cardiology for cardiac clearance.   I will see you back in December for a follow up!  If you have any problems before your next visit feel free to message me via MyChart (minor issues or questions) or call the office, otherwise you may reach out to schedule an office visit.  Thank you! Saddie Sacks, PA-C

## 2023-10-19 NOTE — Assessment & Plan Note (Signed)
 Will increase Gabapentin  from 300 mg BID to 300 mg TID. Refill sent in. Continue regular follow-up with Dr. Silvester.

## 2023-10-19 NOTE — Assessment & Plan Note (Signed)
 Continue follow up with Dr. Silvester. Have increased Gabapentin  to 300 mg TID. She has not had any alcohol  in 4 weeks. Will cont to monitor.

## 2023-10-19 NOTE — Progress Notes (Signed)
 Assessment & Plan:  Pre-operative clearance -     POCT glycosylated hemoglobin (Hb A1C) -     POCT urinalysis dipstick  Essential hypertension -     POCT glycosylated hemoglobin (Hb A1C) -     POCT urinalysis dipstick  Health care maintenance -     POCT glycosylated hemoglobin (Hb A1C) -     POCT urinalysis dipstick  Alcoholic peripheral neuropathy (HCC) Assessment & Plan: Will increase Gabapentin  from 300 mg BID to 300 mg TID. Refill sent in. Continue regular follow-up with Dr. Silvester.   Orders: -     Gabapentin ; Take 1 capsule (300 mg total) by mouth 3 (three) times daily.  Dispense: 90 capsule; Refill: 3  Uncomplicated alcohol  dependence (HCC) Assessment & Plan: Continue follow up with Dr. Silvester. Have increased Gabapentin  to 300 mg TID. She has not had any alcohol  in 4 weeks. Will cont to monitor.  Orders: -     POCT glycosylated hemoglobin (Hb A1C) -     Gabapentin ; Take 1 capsule (300 mg total) by mouth 3 (three) times daily.  Dispense: 90 capsule; Refill: 3  Encounter for screening for diabetes mellitus -     POCT glycosylated hemoglobin (Hb A1C)  Preoperative clearance Assessment & Plan: Right total hip arthroplasty - procedure date not yet scheduled RCRI/NSQIP calculation for MACE is: 0.3% for cardiac complications (average risk = 0.2%). Her risk of any complication after surgery is 3.6% (average = 2.5%).  Advised her to hold NSAIDs 1 wk before surgery (patient not on anti-platelets, GLP-1s, ACE/Arb)  A1c checked today and WNL at 5.3. UA positive for trace leuks, however given that patient is asymptomatic and not pregnant, there is no indication to treat with antibiotics at this time. Cleared for surgery from medical standpoint. Full surgical clearance pending cardiology evaluation.     I have independently evaluated patient.  Terri Ayala is a 68 y.o. female who is slightly above average risk for a high risk surgery (ranked 4/5 on anesthesiology risk  stratification - total right hip athroplasty).  There are not modifiable risk factors (smoking, etc). Terri Ayala's RCRI/NSQIP calculation for MACE is: 0.3% for cardiac complications (average risk = 0.2%). Her risk of any complication after surgery is 3.6% (average = 2.5%).     Review meds: If indicated, NO ACE-I/ARB or NSAID day of surgery. OK to do BB or statin day of (esp if vascular sx).    Pre-operative clearance -     POCT glycosylated hemoglobin (Hb A1C) -     POCT urinalysis dipstick  Essential hypertension -     POCT glycosylated hemoglobin (Hb A1C) -     POCT urinalysis dipstick  Health care maintenance -     POCT glycosylated hemoglobin (Hb A1C) -     POCT urinalysis dipstick  Alcoholic peripheral neuropathy (HCC) Assessment & Plan: Will increase Gabapentin  from 300 mg BID to 300 mg TID. Refill sent in. Continue regular follow-up with Dr. Silvester.   Orders: -     Gabapentin ; Take 1 capsule (300 mg total) by mouth 3 (three) times daily.  Dispense: 90 capsule; Refill: 3  Uncomplicated alcohol  dependence (HCC) Assessment & Plan: Continue follow up with Dr. Silvester. Have increased Gabapentin  to 300 mg TID. She has not had any alcohol  in 4 weeks. Will cont to monitor.  Orders: -     POCT glycosylated hemoglobin (Hb A1C) -     Gabapentin ; Take 1 capsule (300 mg total) by mouth 3 (three)  times daily.  Dispense: 90 capsule; Refill: 3  Encounter for screening for diabetes mellitus -     POCT glycosylated hemoglobin (Hb A1C)  Preoperative clearance Assessment & Plan: Right total hip arthroplasty - procedure date not yet scheduled RCRI/NSQIP calculation for MACE is: 0.3% for cardiac complications (average risk = 0.2%). Her risk of any complication after surgery is 3.6% (average = 2.5%).  Advised her to hold NSAIDs 1 wk before surgery (patient not on anti-platelets, GLP-1s, ACE/Arb)  A1c checked today and WNL at 5.3. UA positive for trace leuks, however given that patient is  asymptomatic and not pregnant, there is no indication to treat with antibiotics at this time. Cleared for surgery from medical standpoint. Full surgical clearance pending cardiology evaluation.      Return in about 4 months (around 02/18/2024) for HTN, HLD, ETOH use, neuropathy .  Subjective:      HPI: Pt is a 68 y.o. female who is here for preoperative clearance for total right hip arthroplasty  1) High Risk Cardiac Conditions:  1) Recent MI - No.  2) Decompensated Heart Failure - No.  3) Unstable angina - No.  4) Symptomatic arrythmia - No.  5) Sx Valvular Disease - No.  2) Intermediate Risk Factors: DM, CKD, CVA, CHF, CAD - No.  2) Functional Status: > 4 mets  = Yes (4.27). [facility] Activity Status Index: 12.45  3) Surgery Specific Risk: High (Emergency, Vascular, Intra-abdominal, Extensive ops)          Intermediate (Carotid, Head and Neck, Orthopaedic )          Low (Endoscopic, Cataract, Breast )  4) Further Noninvasive evaluation:   1) EKG - Yes.     Hx of MI, CVA, CAD, DM, CKD  2) Echo - No.   Worsening dyspnea or CHF without an echo in the past year  3) Stress Testing - Active Cardiac Disease - Pending Cardiac workup   4) CXR No.   If asymptomatic, healthy, no respiratory symptoms it is not needed  5)PFTs No.   OSA, OHS, or significant cardiopulmonary history  5) Need for medical therapy - Beta Blocker, Statins indicated ? Yes.    New complaints: Patient reports that her neuropathy is improving on Gabapentin  300 mg BID. Denies side effects but does report that her neuropathy is still bothersome and causes her to feel unsteady on her feet. She reports that she has not had any alcoholic drinks in >1 month.   Social history:  Relevant past medical, surgical, family and social history reviewed and updated as indicated. Interim medical history since our last visit reviewed.  Allergies and medications reviewed and updated.  DATA REVIEWED: CHART IN EPIC  ROS:  Negative unless specifically indicated above in HPI.    Current Outpatient Medications:    albuterol  (VENTOLIN  HFA) 108 (90 Base) MCG/ACT inhaler, Inhale 2 puffs into the lungs every 6 (six) hours as needed for wheezing or shortness of breath., Disp: , Rfl:    alendronate  (FOSAMAX ) 70 MG tablet, Take 70 mg by mouth every Monday., Disp: , Rfl:    amLODipine  (NORVASC ) 5 MG tablet, Take 1 tablet (5 mg total) by mouth daily., Disp: 30 tablet, Rfl: 0   baclofen  (LIORESAL ) 10 MG tablet, Take 1 tablet (10 mg total) by mouth 3 (three) times daily., Disp: 270 tablet, Rfl: 1   buPROPion  (WELLBUTRIN  XL) 150 MG 24 hr tablet, Take 1 tablet (150 mg total) by mouth every morning., Disp: 90 tablet, Rfl: 1  busPIRone  (BUSPAR ) 15 MG tablet, Take 1 tablet (15 mg total) by mouth 3 (three) times daily., Disp: 20 tablet, Rfl: 0   Calcium  Carbonate-Vitamin D  (CALCIUM  600+D PO), Take 1 tablet by mouth daily., Disp: , Rfl:    carvedilol  (COREG ) 6.25 MG tablet, TAKE 1 TABLET BY MOUTH 2 TIMES DAILY WITH A MEAL., Disp: 180 tablet, Rfl: 1   diclofenac  Sodium (VOLTAREN ) 1 % GEL, Apply topically 4 (four) times daily., Disp: , Rfl:    DULoxetine  (CYMBALTA ) 60 MG capsule, Take 60 mg by mouth 2 (two) times daily., Disp: , Rfl:    feeding supplement (ENSURE PLUS HIGH PROTEIN) LIQD, Take 237 mLs by mouth 3 (three) times daily between meals., Disp: , Rfl:    fluticasone  (FLONASE ) 50 MCG/ACT nasal spray, Place 2 sprays into both nostrils daily as needed for allergies., Disp: , Rfl:    folic acid  (FOLVITE ) 1 MG tablet, TAKE 1 TABLET BY MOUTH DAILY., Disp: 30 tablet, Rfl: 3   gabapentin  (NEURONTIN ) 300 MG capsule, Take 1 capsule (300 mg total) by mouth 3 (three) times daily., Disp: 90 capsule, Rfl: 3   hydrOXYzine  (ATARAX ) 25 MG tablet, Take 25 mg by mouth 3 (three) times daily., Disp: , Rfl:    meloxicam  (MOBIC ) 15 MG tablet, Take 15 mg by mouth daily., Disp: , Rfl:    Multiple Vitamins-Minerals (ADULT GUMMY PO), Take 2 tablets by  mouth daily., Disp: , Rfl:    NON FORMULARY, Testosterone  20 mg/27ml. Use small pea-sized amount once a week as directed, Disp: , Rfl:    NON FORMULARY, Take 5 mg by mouth daily. Fyavolv . Hormone replacement, Disp: , Rfl:    norethindrone -ethinyl estradiol  (FEMHRT  1/5) 1-5 MG-MCG TABS tablet, Take 1 tablet by mouth daily., Disp: , Rfl:    pantoprazole  (PROTONIX ) 40 MG tablet, TAKE 1 TABLET BY MOUTH 2 TIMES DAILY., Disp: 180 tablet, Rfl: 1   propranolol  (INDERAL ) 10 MG tablet, Take 1-2 tablets at onset of panic no more than 2 times daily, Disp: 180 tablet, Rfl: 0   SUMAtriptan  (IMITREX ) 100 MG tablet, TAKE 1 TABLET BY MOUTH AS NEEDED FOR MIGRAINE. MAY REPEAT IN 2 HOURS IF HEADACHE PERSISTS OR RECURS. MAX 2 TABLETS PER DAY, Disp: 15 tablet, Rfl: 1   thiamine  (VITAMIN B1) 100 MG tablet, Take 100 mg by mouth daily., Disp: , Rfl:    traZODone  (DESYREL ) 50 MG tablet, Take 50 mg by mouth at bedtime., Disp: , Rfl:       Objective:    BP 113/70   Pulse 82   Temp 97.8 F (36.6 C) (Oral)   Ht 5' 2 (1.575 m)   Wt 140 lb 0.6 oz (63.5 kg)   SpO2 99%   BMI 25.61 kg/m   Wt Readings from Last 3 Encounters:  10/19/23 140 lb 0.6 oz (63.5 kg)  09/28/23 121 lb 4.1 oz (55 kg)  07/24/23 120 lb (54.4 kg)    Physical Exam Constitutional:      General: She is not in acute distress.    Appearance: Normal appearance.  HENT:     Right Ear: Tympanic membrane normal.     Left Ear: Tympanic membrane normal.     Mouth/Throat:     Mouth: Mucous membranes are moist.     Pharynx: Oropharynx is clear.  Eyes:     Pupils: Pupils are equal, round, and reactive to light.  Cardiovascular:     Rate and Rhythm: Normal rate and regular rhythm.     Heart sounds: Normal  heart sounds. No murmur heard.    No friction rub. No gallop.  Pulmonary:     Effort: Pulmonary effort is normal. No respiratory distress.     Breath sounds: Normal breath sounds.  Abdominal:     General: Abdomen is flat. Bowel sounds are normal.      Palpations: Abdomen is soft.  Musculoskeletal:        General: No swelling.     Cervical back: Normal range of motion.  Lymphadenopathy:     Cervical: No cervical adenopathy.  Skin:    General: Skin is warm and dry.  Neurological:     General: No focal deficit present.     Mental Status: She is alert.  Psychiatric:        Mood and Affect: Mood normal.        Behavior: Behavior normal.        Thought Content: Thought content normal.

## 2023-10-19 NOTE — Assessment & Plan Note (Addendum)
 Right total hip arthroplasty - procedure date not yet scheduled RCRI/NSQIP calculation for MACE is: 0.3% for cardiac complications (average risk = 0.2%). Her risk of any complication after surgery is 3.6% (average = 2.5%).  Advised her to hold NSAIDs 1 wk before surgery (patient not on anti-platelets, GLP-1s, ACE/Arb)  A1c checked today and WNL at 5.3. UA positive for trace leuks, however given that patient is asymptomatic and not pregnant, there is no indication to treat with antibiotics at this time. Cleared for surgery from medical standpoint. Full surgical clearance pending cardiology evaluation.

## 2023-10-23 NOTE — Telephone Encounter (Signed)
 Called patient back, NA

## 2023-10-23 NOTE — Telephone Encounter (Signed)
 3rd attempt : Call patient regarding scheduling a telehealth appointment. Left a message to contact our office.

## 2023-10-23 NOTE — Telephone Encounter (Signed)
 Pt returning call

## 2023-10-26 NOTE — Telephone Encounter (Signed)
 2nd attempt: Called patient, NA, left message to contact our office to schedule telehealth appointment.

## 2023-10-26 NOTE — Telephone Encounter (Signed)
 Pt left a vm asking I she could keep her in office appt instead of doing a tele preop appt.   I called pt back and left message that is fine if she wants to keep the in office appt 11/02/23 with Jackee Alberts, NP.

## 2023-11-02 ENCOUNTER — Ambulatory Visit

## 2023-11-02 ENCOUNTER — Ambulatory Visit: Admitting: Nurse Practitioner

## 2023-11-06 ENCOUNTER — Ambulatory Visit (HOSPITAL_COMMUNITY): Admitting: Medical

## 2023-11-13 ENCOUNTER — Ambulatory Visit (HOSPITAL_COMMUNITY): Admitting: Medical

## 2023-11-16 ENCOUNTER — Ambulatory Visit (HOSPITAL_BASED_OUTPATIENT_CLINIC_OR_DEPARTMENT_OTHER): Admitting: Medical

## 2023-11-16 ENCOUNTER — Encounter (HOSPITAL_COMMUNITY): Payer: Self-pay | Admitting: Medical

## 2023-11-16 ENCOUNTER — Other Ambulatory Visit: Payer: Self-pay

## 2023-11-16 VITALS — BP 143/82 | HR 71 | Ht 62.0 in | Wt 141.0 lb

## 2023-11-16 DIAGNOSIS — Z8781 Personal history of (healed) traumatic fracture: Secondary | ICD-10-CM

## 2023-11-16 DIAGNOSIS — L718 Other rosacea: Secondary | ICD-10-CM

## 2023-11-16 DIAGNOSIS — Z62819 Personal history of unspecified abuse in childhood: Secondary | ICD-10-CM | POA: Diagnosis not present

## 2023-11-16 DIAGNOSIS — F1291 Cannabis use, unspecified, in remission: Secondary | ICD-10-CM | POA: Diagnosis not present

## 2023-11-16 DIAGNOSIS — K2920 Alcoholic gastritis without bleeding: Secondary | ICD-10-CM

## 2023-11-16 DIAGNOSIS — Z8659 Personal history of other mental and behavioral disorders: Secondary | ICD-10-CM

## 2023-11-16 DIAGNOSIS — G621 Alcoholic polyneuropathy: Secondary | ICD-10-CM

## 2023-11-16 DIAGNOSIS — F4312 Post-traumatic stress disorder, chronic: Secondary | ICD-10-CM | POA: Diagnosis not present

## 2023-11-16 DIAGNOSIS — K709 Alcoholic liver disease, unspecified: Secondary | ICD-10-CM

## 2023-11-16 DIAGNOSIS — H9190 Unspecified hearing loss, unspecified ear: Secondary | ICD-10-CM

## 2023-11-16 DIAGNOSIS — Z8669 Personal history of other diseases of the nervous system and sense organs: Secondary | ICD-10-CM

## 2023-11-16 DIAGNOSIS — F4320 Adjustment disorder, unspecified: Secondary | ICD-10-CM

## 2023-11-16 DIAGNOSIS — F1021 Alcohol dependence, in remission: Secondary | ICD-10-CM | POA: Diagnosis not present

## 2023-11-16 DIAGNOSIS — Z79899 Other long term (current) drug therapy: Secondary | ICD-10-CM

## 2023-11-16 DIAGNOSIS — E8889 Other specified metabolic disorders: Secondary | ICD-10-CM

## 2023-11-16 DIAGNOSIS — Z9889 Other specified postprocedural states: Secondary | ICD-10-CM

## 2023-11-16 MED ORDER — PROPRANOLOL HCL 10 MG PO TABS: 10.0000 mg | ORAL_TABLET | Freq: Two times a day (BID) | ORAL | Status: DC | PRN

## 2023-11-16 MED ORDER — PROPRANOLOL HCL 10 MG PO TABS: ORAL_TABLET | ORAL | 0 refills | Status: AC

## 2023-11-16 NOTE — Progress Notes (Addendum)
 [facility] MD/PA/NP OP Progress Note  11/16/2023 4:33 PM Terri Ayala  MRN:  [id number]  Chief Complaint:  Chief Complaint  Patient presents with   Follow-up   Alcohol  Problem   Grief   Trauma   Stress   Anxiety   Depression   HPI: Terri Ayala comes in person today accompanied by her Aide and is looking well. She does use a cane.She admits to drinking alcohol  for 21/2 days when her brother died unexpectedly some 5 weeks ago and her dog died. .(Prior drinking episodes (See PMH) ended up with a fall that led to Hospitalization ) She was originally scheduled to come last week but called to say she could not arrange transportation. Today there is no indication of current alcohol  use She continues on her same Psychiatric meds at home. She gets Wellbutrin  when able while hospitalized,She has no cf acute depression she/her/hers does have a chronic dysphoria from her traumas past and recent which unfortunately she continues to turn to alcohol  to self medicate.  Visit Diagnosis:    ICD-10-CM   1. Alcohol  use disorder, severe, in early remission (HCC)  F10.21     2. Cannabis use disorder in remission  F12.91     3. Trauma in childhood  Z62.819     4. Chronic post-traumatic stress disorder (PTSD)  F43.12     5. Grief reaction  F43.20    Sudden death of her brother who was a great support and death of her dog at same time    6. History of major depression  Z86.59     7. Alcoholic liver disorder (HCC)  K70.9     8. Status post open reduction with internal fixation (ORIF) of fracture of ankle  Z98.890    Z87.81     9. Hx of migraines  Z86.69     10. Rosacea keratitis  L71.8     11. Medication management  Z79.899     12. Inherited disorder of folate metabolism (HCC)  E88.89     13. Alcoholic peripheral neuropathy (HCC)  G62.1     14. Chronic alcoholic gastritis without hemorrhage  K29.20     15. Hard of hearing  H91.90       Past Psychiatric History:  Drug Induced Encephalopathy  Admit  date: 02/15/2022 Discharge date:   02/25/2022   [facility] visit to ED[facility]   [facility] CDIOP Date of Admission: 08/18/2021 Date of Discharge: 10/19-20/2023 FU /Med Management 10/23 to present   Past Medical History:   On 05/01/2023 patient saw PCP and admiited return to use. Review of EPIC revealed 2 phone calls to Terri Maier LCSW CDIOP Counselor 1/08 and 03/17/2023 requesting return to IOP but she did not return his calls PCP contacted this provider who advised Detox and referral to Counselor [place].  Today Terri Ayala reports her last drink was last nite.When asked what she wanted to do at this point she was willing to do short term inpt but not residential treatment.sHE SAYS tHE HOLIDAYS GOT ME DOWN  07/24/2023 She presented to [facility] ED with history of a fall:  Admit Date: 07/24/2023  Discharge date: 08/07/2023  HPI  Terri Ayala is a 69 y.o. female with medical history significant of depression, anxiety, PTSD, alcohol  use, hypertension neuropathy migraines  Presented with fall Patient come from home with a fall down for 5 steps.  Patient has been drinking heavily since Christmas about 2 bottles of wine per day. Was under the influence during the  fall.  Denies hitting her head but is a little unsure about that.  Denies LOC She is not on blood thinners She has hurt her left side of her rib cage. History of DTs in the past.  History of anxiety and depression. Last EtOH was this morning she is starting to feel firm somewhat tremulous and sweaty now and thinks that she is going through DTs now and thinks that she is going through DTs   significant ETOH intake 2 bottles of wine per day last alcohol  drink was this morning positive history of DTs s  She was discharged on 08/07/2023 to skilled nursing facilty      Terri Donalda HERO, MD  Physician Hospitalist  Discharge Summary    Recommendations for Outpatient Follow-up:  Follow up with PCP in 1-2 weeks Please obtain CMP/CBC in  one week Disposition: Skilled nursing facility   On 09/28/23 she presented to ER again Terri Ayala is a 68 y.o. female past medical history significant for hypertension, hyperlipidemia, GERD, peripheral neuropathy, alcohol  use disorder with recent admission and discharged on 08/07/2023 for acute alcoholic encephalopathy who presents emergency department with concerns for alcohol  withdrawal and chest pain.  Patient states that her last drink of alcohol  was approximately 5 to 6 PM last night.  :SHE WAS AGAIN ADMITTED Admit date: 09/28/2023  Discharge date: 10/02/2023  Alcohol  withdrawal -IV/PO ativan  per CIWA protocol, add Librium  and IV fluids, and early DTs, strictly counseled to abstain from alcohol  abuse.  Continue to monitor, any worse will add phenobarb. Hospital Course   Alcohol  withdrawal  -was treated with Librium  along with CIWA protocol, DTs completely resolved, she was also adequately hydrated, currently symptom-free, walked with PT, will be discharged home, outpatient alcohol  abstaining resources provided.  PCP to monitor.  HTN  on Coreg  6.25mg  BID, added Norvasc  for better control  Mood disorder   -PTA Wellbutrin  150mg  daily She has a new PCP and has been seen for Right total hip arthroplasty - procedure date not yet scheduled .She was cleared for surgery from medical standpoint. Full surgical clearance pending cardiology evaluation.    Discharge Disposition Home    Past Surgical History:  Procedure Laterality Date   BREAST BIOPSY Right    benign   CATARACT EXTRACTION W/ INTRAOCULAR LENS  IMPLANT, BILATERAL     CERVICAL CONE BIOPSY  2000s   CERVIX LESION DESTRUCTION  1991   dysplasia; lesions   CHOLECYSTECTOMY N/A 03/14/2013   Procedure: LAPAROSCOPIC CHOLECYSTECTOMY WITH ATTEMPTED INTRAOPERATIVE CHOLANGIOGRAM;  Surgeon: Terri DELENA Poli, MD;  Location: [facility] OR;  Service: General;  Laterality: N/A;   HARDWARE REMOVAL Left 07/22/2021   Procedure: HARDWARE REMOVAL;  Surgeon:  Terri Sharper, MD;  Location: [facility] OR;  Service: Orthopedics;  Laterality: Left;   laporoscopic abdominal surgery     for endometriosis   LEFT HEART CATH AND CORONARY ANGIOGRAPHY N/A 05/07/2018   Procedure: LEFT HEART CATH AND CORONARY ANGIOGRAPHY;  Surgeon: Claudene Victory ORN, MD;  Location: [facility] INVASIVE CV LAB;  Service: Cardiovascular;  Laterality: N/A;   ORIF ANKLE FRACTURE Left 02/11/2021   Procedure: OPEN REDUCTION INTERNAL FIXATION (ORIF) ANKLE FRACTURE;  Surgeon: Terri Sharper, MD;  Location: [facility] OR;  Service: Orthopedics;  Laterality: Left;   TONSILLECTOMY      Family Psychiatric History:  Maternal uncle alcoholic   Family History:  Family History  Problem Relation Age of Onset   Colon cancer Father        Deceased, 24   Osteoporosis Mother  Living, 38   Healthy Brother    Healthy Brother    Healthy Son    Healthy Son    Thyroid  disease Neg Hx    Goiter Neg Hx     Social History:  Social History   Socioeconomic History              Marital status: Single      Spouse name: NA   Number of children: None   Years of education: 16   Highest education level: BS Biology degree  Occupational History   Occupation: Hydrographic surveyor - retired      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[employer]  Tobacco Use   Smoking status: Former      Packs/day: 1.00      Years: 7.00      Pack years: 7.00      Types: Cigarettes      Quit date: 02/28/1974      Years since quitting: 47.3   Smokeless tobacco: Never   Tobacco comments:      01/04/2018 smoked when I was a teenager  Vaping Use   Vaping Use: Never used  Substance and Sexual Activity   Alcohol  use: Yes      Alcohol /week: 1-3 bottles of wine daily      Types: Wine      Comment: Detox 06/2021 CD IOP 6/23-8/25/2023   Drug use: 2023/2024 CBD Gummies resulting in Metabolic encephalitis   Sexual activity: Not Currently  Other Topics Concern   Mr. Vinie Charity who has POA. (Brother)  Social History Narrative    Lives alone.  She has a BS  biology.    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        Social Drivers of Acupuncturist Strain: Low Risk  (01/22/2023)    Overall Financial Resource Strain (CARDIA)     Difficulty of Paying Living Expenses: Not very hard  Recent Concern: Financial Resource Strain - Medium Risk (12/27/2022)    Overall Financial Resource Strain (CARDIA)     Difficulty of Paying Living Expenses: Somewhat hard  Food Insecurity: No Food Insecurity (01/23/2023)    Hunger Vital Sign     Worried About Running Out of Food in the Last Year: Never true     Ran Out of Food in the Last Year: Never true  Transportation Needs: No Transportation Needs (01/23/2023)    PRAPARE - Therapist, art (Medical): No     Lack of Transportation (Non-Medical): No  Recent Concern: Transportation Needs - Unmet Transportation Needs (12/27/2022)    PRAPARE - Transportation     Lack of Transportation (Medical): Yes     Lack of Transportation (Non-Medical): Yes  Physical Activity: Insufficiently Active (01/22/2023)    Exercise Vital Sign     Days of Exercise per Week: 2 days     Minutes of Exercise per Session: 20 min  Stress: No Stress Concern Present (01/22/2023)    [employer] of Occupational Health - Occupational Stress Questionnaire     Feeling of Stress : Only a little  Social Connections: Socially Isolated (01/23/2023)    Social Connection and Isolation Panel [NHANES]     Frequency of Communication with Friends and Family: Once a week     Frequency of Social Gatherings with Friends and Family: Once a week     Attends Religious Services: 1 to 4 times per year  Active Member of Clubs or Organizations: No     Attends Banker Meetings: Never     Marital Status: Never married     Allergies:  Allergies  Allergen Reactions   Doxycycline Swelling    Mouth swelling and sores     Tetracycline Hcl Swelling and Other (See  Comments)    Mouth swelling and sores   Codeine Itching    Metabolic Disorder Labs: Lab Results  Component Value Date   HGBA1C 5.3 10/19/2023   MPG 88.19 11/21/2020   MPG 97 03/19/2013   No results found for: PROLACTIN Lab Results  Component Value Date   CHOL 193 04/22/2022   TRIG 104 04/22/2022   HDL 49 04/22/2022   CHOLHDL 3.9 04/22/2022   VLDL 10 10/04/2012   LDLCALC 125 (H) 04/22/2022   LDLCALC 168 (H) 12/15/2021   Lab Results  Component Value Date   TSH 6.915 (H) 07/24/2023   TSH 3.018 02/16/2022    Therapeutic Level Labs:NA   Current Medications: Current Outpatient Medications  Medication Sig Dispense Refill   albuterol  (VENTOLIN  HFA) 108 (90 Base) MCG/ACT inhaler Inhale 2 puffs into the lungs every 6 (six) hours as needed for wheezing or shortness of breath.     alendronate  (FOSAMAX ) 70 MG tablet Take 70 mg by mouth every Monday.     baclofen  (LIORESAL ) 10 MG tablet Take 1 tablet (10 mg total) by mouth 3 (three) times daily. 270 tablet 1   buPROPion  (WELLBUTRIN  XL) 150 MG 24 hr tablet Take 1 tablet (150 mg total) by mouth every morning. 90 tablet 1   Calcium  Carbonate-Vitamin D  (CALCIUM  600+D PO) Take 1 tablet by mouth daily.     carvedilol  (COREG ) 6.25 MG tablet TAKE 1 TABLET BY MOUTH 2 TIMES DAILY WITH A MEAL. 180 tablet 1   feeding supplement (ENSURE PLUS HIGH PROTEIN) LIQD Take 237 mLs by mouth 3 (three) times daily between meals. (Patient taking differently: Take 237 mLs by mouth 3 (three) times daily between meals.)     fluticasone  (FLONASE ) 50 MCG/ACT nasal spray Place 2 sprays into both nostrils daily as needed for allergies.     folic acid  (FOLVITE ) 1 MG tablet TAKE 1 TABLET BY MOUTH DAILY. 30 tablet 3   gabapentin  (NEURONTIN ) 300 MG capsule Take 1 capsule (300 mg total) by mouth 3 (three) times daily. 90 capsule 3   hydrOXYzine  (ATARAX ) 25 MG tablet Take 25 mg by mouth 3 (three) times daily.     meloxicam  (MOBIC ) 15 MG tablet Take 15 mg by mouth daily.  (Patient taking differently: Take 15 mg by mouth 2 (two) times daily as needed.)     Multiple Vitamins-Minerals (ADULT GUMMY PO) Take 2 tablets by mouth daily.     NON FORMULARY Testosterone  20 mg/57ml. Use small pea-sized amount once a week as directed     NON FORMULARY Take 5 mg by mouth daily. Fyavolv . Hormone replacement     norethindrone -ethinyl estradiol  (FEMHRT  1/5) 1-5 MG-MCG TABS tablet Take 1 tablet by mouth daily.     pantoprazole  (PROTONIX ) 40 MG tablet TAKE 1 TABLET BY MOUTH 2 TIMES DAILY. 180 tablet 1   propranolol  (INDERAL ) 10 MG tablet Take 1 tablet (10 mg total) by mouth 2 (two) times daily as needed for up to 90 doses (Panic). 90 tablet O   SUMAtriptan  (IMITREX ) 100 MG tablet TAKE 1 TABLET BY MOUTH AS NEEDED FOR MIGRAINE. MAY REPEAT IN 2 HOURS IF HEADACHE PERSISTS OR RECURS. MAX 2 TABLETS PER DAY  15 tablet 1   thiamine  (VITAMIN B1) 100 MG tablet Take 100 mg by mouth daily.     traZODone  (DESYREL ) 100 MG tablet Take 100 mg by mouth at bedtime.     tretinoin (RETIN-A) 0.025 % cream 1 application in the evening to face Externally Once a day, PM pea size amount; Duration: 30 days     triamcinolone  cream (KENALOG ) 0.1 % apply Externally Twice a day; Duration: 30 days     amLODipine  (NORVASC ) 5 MG tablet Take 1 tablet (5 mg total) by mouth daily. (Patient not taking: Reported on 11/16/2023) 30 tablet 0   diclofenac  Sodium (VOLTAREN ) 1 % GEL Apply topically 4 (four) times daily. (Patient not taking: Reported on 11/16/2023)     traZODone  (DESYREL ) 100 MG tablet Take 100 mg by mouth at bedtime. (Patient not taking: Reported on 11/16/2023)     No current facility-administered medications for this visit.     Musculoskeletal: Strength & Muscle Tone: decreased Gait & Station: unsteady, Uses cane Patient leans: She leans forward and turns to right so she can hear (she only has 1 Hearinf aid-the other is lost  Psychiatric Specialty Exam: Review of Systems  Constitutional:  Positive for  activity change. Negative for appetite change, chills, diaphoresis, fatigue, fever and unexpected weight change.  HENT:  Positive for hearing loss (Aids prescribed but she has lost Rt). Negative for congestion, dental problem, drooling, ear discharge, ear pain, facial swelling, mouth sores, nosebleeds, postnasal drip, rhinorrhea, sinus pressure, sinus pain, sneezing, sore throat, tinnitus, trouble swallowing and voice change.   Eyes:  Negative for photophobia, pain, discharge, redness, itching and visual disturbance.  Respiratory:  Negative for apnea, cough, choking, chest tightness, shortness of breath, wheezing and stridor.   Cardiovascular:  Negative for chest pain, palpitations and leg swelling.  Gastrointestinal:  Positive for abdominal pain. Negative for abdominal distention, anal bleeding, blood in stool, constipation, diarrhea, nausea, rectal pain and vomiting.  Endocrine: Negative for cold intolerance, heat intolerance, polydipsia, polyphagia and polyuria.  Genitourinary:  Positive for menstrual problem (On hormone replacement). Negative for decreased urine volume, difficulty urinating, dyspareunia, dysuria, enuresis, flank pain, frequency, genital sores, hematuria, pelvic pain, urgency, vaginal bleeding, vaginal discharge and vaginal pain.  Musculoskeletal:  Positive for arthralgias, gait problem and joint swelling. Negative for back pain, myalgias, neck pain and neck stiffness.       Pending Rt THR  Skin:  Positive for color change and rash. Negative for pallor and wound.       Rosacea  Allergic/Immunologic: Positive for environmental allergies. Negative for food allergies and immunocompromised state.  Neurological:  Positive for tremors, weakness, numbness (Alcvoholic peripheral neuropathy) and headaches (Hx of Migraines). Negative for dizziness, seizures, syncope, facial asymmetry, speech difficulty and light-headedness.  Hematological:  Negative for adenopathy. Does not bruise/bleed  easily.  Psychiatric/Behavioral:  Positive for agitation, behavioral problems, dysphoric mood and sleep disturbance. Negative for confusion, decreased concentration, hallucinations, self-injury and suicidal ideas. The patient is nervous/anxious. The patient is not hyperactive.     Blood pressure (!) 143/82, pulse 71, height 5' 2 (1.575 m), weight 141 lb (64 kg).Body mass index is 25.79 kg/m.  General Appearance: Casual and Well Groomed  Eye Contact:  Good  Speech:  Clear and Coherent and Normal Rate  Volume:  Normal  Mood:  Euthymic  Affect:  Appropriate and Congruent  Thought Process:  Coherent, Goal Directed, and Descriptions of Associations: Intact  Orientation:  Full (Time, Place, and Person)  Thought Content: WDL  Suicidal Thoughts:  No  Homicidal Thoughts:  No  Memory:  Traumatized  Judgement:  Impaired  Insight:  Lacking  Psychomotor Activity:  Normal  Concentration:  Concentration: Good and Attention Span: Good  Recall:  Fair  Fund of Knowledge: WDL  Language: Good  Akathisia:  NA  Handed:  Right  AIMS (if indicated): NA  Assets:  Desire for Improvement Financial Resources/Insurance Housing Resilience Social Support Talents/Skills Transportation Vocational/Educational  ADL's:  Impaired-Rt hip  Cognition: Impaired,  Moderate and Severe (Alcohol )  Sleep:  Uses Trazodone  sometimes   Screenings: AUDIT    Flowsheet Row [facility]) [facility]  Alcohol  Use Disorder Identification Test Final Score (AUDIT) 18 21   GAD-7    Flowsheet Row Office Visit from 05/01/2023 in [facility] Visit from 12/28/2022 in [facility] at [facility] Office Visit from 10/17/2022 in [facility] Primary Care at [facility] Office Visit from 08/23/2022 in [facility] at [facility]  Clinical Support from 12/07/2021 in [facility] at [facility]  Total GAD-7 Score 0 2 4 1  0   PHQ2-9    Flowsheet Row Office Visit from 05/01/2023 in [facility] at [facility] Clinical Support from 01/23/2023 in [facility] Primary Care at [facility] Office Visit from 12/28/2022 in [facility] Primary Care at [facility] Office Visit from 10/17/2022 in [facility] Primary Care at [facility] Office Visit from 08/23/2022 in [facility] Primary Care at [facility]  PHQ-2 Total Score 0 0 0 1 0  PHQ-9 Total Score 0 0 4 5 2    Flowsheet Row ED to Hosp-Admission (Discharged) from 09/28/2023 in [facility] ED from 09/07/2023 in [facility] Emergency Department at [facility] ED to Hosp-Admission (Discharged) from 07/24/2023 in [facility]  C-SSRS RISK CATEGORY No Risk No Risk No Risk     Assessment  Chronic alcoholism with repeated usage-recommend treatment -most likely residential?([facility] has refused in past and she is scheduling hip surgery so anything would need to wait until she completed rehabilitation Hopefully this will preclude any further drinking for time being Attempts to get her to stop using have failed so far in the Outpatient enviornment Isolation She is basically alone with the death of her brother and isnt using AA to build support with others who know the suffering and have founs a solution and end her isolation (She used to attend) High fall risk  Aging See Diagnosis list for other multiple issues   and Plan: Will continue her current meds Have asked her to FU virtually in 3 months to reassess call sooner if needed FU as scheduled with other providers  .    Carlin Emmer, PA-C 11/16/2023, 4:33 PM

## 2023-11-16 NOTE — Addendum Note (Signed)
 Addended by: LEILA CARLIN BRAVO on: 11/16/2023 05:44 PM   Modules accepted: Orders

## 2023-11-22 ENCOUNTER — Ambulatory Visit: Payer: Self-pay

## 2023-11-22 NOTE — Telephone Encounter (Signed)
 FYI Only or Action Required?: FYI only for provider.  Patient was last seen in primary care on 10/19/2023 by Gayle Saddie FALCON, PA-C.  Called Nurse Triage reporting Foot Swelling and Hand Problem.  Symptoms began several weeks ago.  Interventions attempted: OTC medications: ibuprofen .  Symptoms are: bilateral hand swelling (mild) with pain, bilateral shoulder pain radiates down both arms, bilateral feet swelling gradually worsening.  Triage Disposition: See Physician Within 24 Hours  Patient/caregiver understands and will follow disposition?: Yes            Copied from CRM #[id number]. Topic: Clinical - Red Word Triage >> Nov 22, 2023  9:57 AM Dedra B wrote: [facility] that prompted transfer to Nurse Triage: Pt has had swelling in her hands and feet that's not going away. Warm transfer to NT. Reason for Disposition  [1] MILD swelling (puffiness) of both hands AND [2] new-onset or getting worse  (Exception: Caused by hot weather or normal pregnancy swelling.)  Answer Assessment - Initial Assessment Questions 1. ONSET: When did the swelling start? (e.g., minutes, hours, days)     1.5 weeks.  2. LOCATION: What part of the hand is swollen?  Are both hands swollen or just one hand?     Whole hand, bilateral.  3. TYPE : What does it look like? (e.g., ball, lump; localized; hand swelling)     She states the hands do not look very swollen but when she makes the fist and hold her hands out she can see the swelling.  4. SWELLING SEVERITY: If more than a lump or localized, ask: How bad is the hand swelling? (e.g., mild, moderate, severe; describe)     Mild. She states her fingers don't look fat and her hands don't look swollen unless she makes a fist.  5. REDNESS: Is there redness or signs of infection?     No.  6. PAIN: Is the swelling painful to touch? If Yes, ask: How painful is it?   (Scale 1-10; mild, moderate or severe)     Yes, when closing her hands, grabbing an  object or making a fist 5-6/10. No pain at rest.  7. FEVER: Do you have a fever? If Yes, ask: What is it, how was it measured, and when did it start?      No.  8. CAUSE: What do you think is causing the hand swelling? (e.g., heat, insect bite, pregnancy, recent injury)     Arthritis.  9. MEDICAL HISTORY: Do you have a history of heart failure, kidney disease, liver failure, or cancer?     No.  10. RECURRENT SYMPTOM: Have you had hand swelling before? If Yes, ask: When was the last time? What happened that time?       She states she has had hand swelling but not like this.  11. OTHER SYMPTOMS: Do you have any other symptoms? (e.g., blurred vision, difficulty breathing, headache)       Bilateral shoulder pain radiates down arm (same onset, positional), bilateral feet swelling (left foot numbness, she states she has seen a foot doctor and received cortisone shot for it). Denies chest pain, SOB, weakness, headaches, blurred vision, facial swelling.  12. PREGNANCY: Is there any chance you are pregnant? When was your last menstrual period?       N/A.  Protocols used: Hand Swelling-A-AH

## 2023-11-29 ENCOUNTER — Other Ambulatory Visit: Payer: Self-pay | Admitting: Family Medicine

## 2023-12-05 ENCOUNTER — Encounter: Payer: Self-pay | Admitting: Podiatrist

## 2023-12-05 ENCOUNTER — Ambulatory Visit (INDEPENDENT_AMBULATORY_CARE_PROVIDER_SITE_OTHER): Admitting: Podiatrist

## 2023-12-05 ENCOUNTER — Ambulatory Visit: Admitting: Podiatry

## 2023-12-05 DIAGNOSIS — G5782 Other specified mononeuropathies of left lower limb: Secondary | ICD-10-CM

## 2023-12-05 DIAGNOSIS — R197 Diarrhea, unspecified: Secondary | ICD-10-CM | POA: Insufficient documentation

## 2023-12-05 DIAGNOSIS — R1310 Dysphagia, unspecified: Secondary | ICD-10-CM | POA: Insufficient documentation

## 2023-12-05 DIAGNOSIS — S90129A Contusion of unspecified lesser toe(s) without damage to nail, initial encounter: Secondary | ICD-10-CM | POA: Insufficient documentation

## 2023-12-05 DIAGNOSIS — Z800 Family history of malignant neoplasm of digestive organs: Secondary | ICD-10-CM | POA: Insufficient documentation

## 2023-12-05 MED ORDER — MELOXICAM 15 MG PO TABS: 15.0000 mg | ORAL_TABLET | Freq: Every day | ORAL | 2 refills | Status: AC

## 2023-12-05 NOTE — Progress Notes (Signed)
  She presents today chief complaint of a painful fourth digit left foot states that she received an injection a few months ago from Dr. Verta and she is much improved.  Still some residual pain present.       Objective: Vital signs stable alert oriented x 3.  She has strong palpable pulses no open lesions or wounds.   She has mild pain on palpation of the neuroma with a palpable Mulder's click third interdigital space of the left foot.  Assessment: Neuroma third interspace left  Hammertoe deformity.  Plan: Injected the left foot today 10 mg Kenalog  5 mg Marcaine  point of maximal tenderness.  Tolerated procedure well without complications  she will be seen back as needed if the injection fails to relieve her discomfort.

## 2024-01-13 ENCOUNTER — Other Ambulatory Visit: Payer: Self-pay | Admitting: Family Medicine

## 2024-01-13 DIAGNOSIS — F1020 Alcohol dependence, uncomplicated: Secondary | ICD-10-CM

## 2024-01-18 ENCOUNTER — Ambulatory Visit (INDEPENDENT_AMBULATORY_CARE_PROVIDER_SITE_OTHER): Admitting: Radiology

## 2024-01-18 ENCOUNTER — Ambulatory Visit
Admission: EM | Admit: 2024-01-18 | Discharge: 2024-01-18 | Disposition: A | Discharge status: Home / Self Care | Attending: Nurse Practitioner | Admitting: Nurse Practitioner

## 2024-01-18 ENCOUNTER — Other Ambulatory Visit: Payer: Self-pay

## 2024-01-18 ENCOUNTER — Ambulatory Visit: Payer: Self-pay | Admitting: Nurse Practitioner

## 2024-01-18 ENCOUNTER — Telehealth: Payer: Self-pay | Admitting: Nurse Practitioner

## 2024-01-18 DIAGNOSIS — W19XXXA Unspecified fall, initial encounter: Secondary | ICD-10-CM | POA: Diagnosis not present

## 2024-01-18 DIAGNOSIS — R0789 Other chest pain: Secondary | ICD-10-CM | POA: Diagnosis not present

## 2024-01-18 DIAGNOSIS — S2231XA Fracture of one rib, right side, initial encounter for closed fracture: Secondary | ICD-10-CM | POA: Diagnosis not present

## 2024-01-18 MED ORDER — HYDROCODONE-ACETAMINOPHEN 5-325 MG PO TABS: 1.0000 | ORAL_TABLET | Freq: Two times a day (BID) | ORAL | 0 refills | Status: DC | PRN

## 2024-01-18 NOTE — ED Provider Notes (Signed)
 [facility]    CSN: [id number] Arrival date & time: 01/18/24  9051      History   Chief Complaint No chief complaint on file.   HPI Terri Ayala is a 68 y.o. female presents for follow-up.  Patient reports 3 days ago while in her garage she tripped on a trash can and fell landing onto a leaf blower injuring her right ribs.  She denies head injury or LOC.  Reports since then she has been having right posterior/lateral rib pain that is worse with movement and deep breathing.  Denies any shortness of breath or hemoptysis.  No bruising or swelling.  No hematuria.  Does have a history of rib fracture in the past on the right side.  She is not on blood thinning medications.  Chart review shows history of alcohol  abuse but she denies any alcohol  or illicit drug use.  She took meloxicam  for symptoms.  No other injuries or concerns at this time  HPI  Past Medical History:  Diagnosis Date   Alcohol  withdrawal (HCC) 11/2017; 01/04/2018   Alcoholism (HCC)    Anxiety    Arthritis    hands (01/04/2018)   ASYMPTOMATIC POSTMENOPAUSAL STATUS 08/17/2009   Chronic lower back pain    Depression    DYSPHAGIA 06/19/2007   Fatty liver, alcoholic    GERD (gastroesophageal reflux disease)    GOITER, MULTINODULAR 08/17/2009   History of colonic polyps 03/25/2020   Hyperlipidemia    Hypertension    Migraines    not sure what triggers them; I'll have 1 q couple weeks or more; come 2 days in a row when they come (01/04/2018)   Mitral valve prolapse    OSTEOPOROSIS 08/17/2009   PONV (postoperative nausea and vomiting)    Supraventricular tachycardia     Patient Active Problem List   Diagnosis Date Noted   Contusion of toe 12/05/2023   Diarrhea, unspecified 12/05/2023   Dysphagia, unspecified 12/05/2023   Family history of malignant neoplasm of colon 12/05/2023   Preoperative clearance 10/19/2023   Malnutrition of moderate degree 07/28/2023   Alcohol  withdrawal (HCC) 07/24/2023    Elevated TSH 07/24/2023   Sensorineural hearing loss, bilateral 01/01/2023   Foreign body of right ear 01/01/2023   Alcoholic peripheral neuropathy 12/28/2022   Mild cognitive impairment 10/17/2022   Balance problem 05/23/2022   Abnormal movement 05/23/2022   Weakness of both hands 05/23/2022   Rosacea 05/17/2021   Seborrheic keratosis 05/17/2021   Melanocytic nevi of trunk 05/17/2021   Chronic pain of left ankle 05/12/2021   Body mass index 26.0-26.9, adult 05/12/2021   Thyroid  nodule 02/10/2021   Prolonged QT interval 02/10/2021   Elevated cholesterol 10/19/2020   Gastroparesis 03/25/2020   Gastroesophageal reflux disease without esophagitis 03/25/2020   Elevated LFTs 07/12/2018   Left rib fracture 01/04/2018   Leukocytosis 12/08/2017   Benign essential HTN 12/08/2017   Osteopenia 08/27/2016   Major depressive disorder, recurrent episode, moderate (HCC) 01/27/2016   Alcohol  dependence with withdrawal (HCC) 01/27/2016   Uncomplicated alcohol  dependence (HCC) 04/03/2014   Lumbosacral radiculopathy at L5 05/20/2013   Protein-calorie malnutrition, severe 03/18/2013   Nonspecific elevation of levels of transaminase or lactic acid dehydrogenase (LDH) 03/16/2013   Hepatic steatosis 03/16/2013   Chronic cholecystitis 03/14/2013   Cervical arthritis 10/12/2012   Palpitations 11/29/2010   Supraventricular tachycardia 11/29/2010   Mitral valve prolapse 11/29/2010   Decreased libido 11/15/2010   Multinodular goiter 08/17/2009   Migraine 08/17/2009   Hearing loss 08/17/2009  Osteoporosis 08/17/2009   Generalized anxiety disorder 08/17/2009   Eosinophilic esophagitis 06/19/2007    Past Surgical History:  Procedure Laterality Date   BREAST BIOPSY Right    benign   CATARACT EXTRACTION W/ INTRAOCULAR LENS  IMPLANT, BILATERAL     CERVICAL CONE BIOPSY  2000s   CERVIX LESION DESTRUCTION  1991   dysplasia; lesions   CHOLECYSTECTOMY N/A 03/14/2013   Procedure: LAPAROSCOPIC  CHOLECYSTECTOMY WITH ATTEMPTED INTRAOPERATIVE CHOLANGIOGRAM;  Surgeon: Vicenta DELENA Poli, MD;  Location: [facility] OR;  Service: General;  Laterality: N/A;   HARDWARE REMOVAL Left 07/22/2021   Procedure: HARDWARE REMOVAL;  Surgeon: Celena Sharper, MD;  Location: [facility] OR;  Service: Orthopedics;  Laterality: Left;   laporoscopic abdominal surgery     for endometriosis   LEFT HEART CATH AND CORONARY ANGIOGRAPHY N/A 05/07/2018   Procedure: LEFT HEART CATH AND CORONARY ANGIOGRAPHY;  Surgeon: Claudene Victory ORN, MD;  Location: [facility];  Service: Cardiovascular;  Laterality: N/A;   ORIF ANKLE FRACTURE Left 02/11/2021   Procedure: OPEN REDUCTION INTERNAL FIXATION (ORIF) ANKLE FRACTURE;  Surgeon: Celena Sharper, MD;  Location: [facility] OR;  Service: Orthopedics;  Laterality: Left;   TONSILLECTOMY      OB History   No obstetric history on file.      Home Medications    Prior to Admission medications   Medication Sig Start Date End Date Taking? Authorizing Provider  albuterol  (VENTOLIN  HFA) 108 (90 Base) MCG/ACT inhaler INHALE 2 PUFFS INTO THE LUNGS EVERY 6 HOURS AS NEEDED FOR WHEEZING OR SHORTNESS OF BREATH. 11/29/23   Gayle Numbers F, PA-C  alendronate  (FOSAMAX ) 70 MG tablet Take 70 mg by mouth every Monday.    [provider]  baclofen  (LIORESAL ) 10 MG tablet Take 1 tablet (10 mg total) by mouth 3 (three) times daily. 09/14/23 03/12/24  Kober, Charles E, PA-C  buPROPion  (WELLBUTRIN  XL) 150 MG 24 hr tablet Take 1 tablet (150 mg total) by mouth every morning. 09/14/23 03/12/24  Kober, Charles E, PA-C  Calcium  Carbonate-Vitamin D  (CALCIUM  600+D PO) Take 1 tablet by mouth daily.    [provider]  carvedilol  (COREG ) 6.25 MG tablet TAKE 1 TABLET BY MOUTH 2 TIMES DAILY WITH A MEAL. 09/04/23   Clapp, Kara F, PA-C  feeding supplement (ENSURE PLUS HIGH PROTEIN) LIQD Take 237 mLs by mouth 3 (three) times daily between meals. Patient taking differently: Take 237 mLs by mouth 3 (three) times daily  between meals. 08/07/23   Ghimire, Donalda HERO, MD  fluticasone  (FLONASE ) 50 MCG/ACT nasal spray Place 2 sprays into both nostrils daily as needed for allergies.    [provider]  folic acid  (FOLVITE ) 1 MG tablet TAKE 1 TABLET BY MOUTH DAILY. 01/15/24   Chandra Toribio POUR, MD  gabapentin  (NEURONTIN ) 300 MG capsule Take 1 capsule (300 mg total) by mouth 3 (three) times daily. 10/19/23   Gayle Numbers FALCON, PA-C  hydrOXYzine  (ATARAX ) 25 MG tablet Take 25 mg by mouth 3 (three) times daily. 01/18/23   [provider]  meloxicam  (MOBIC ) 15 MG tablet Take 15 mg by mouth daily. Patient taking differently: Take 15 mg by mouth 2 (two) times daily as needed.    [provider]  meloxicam  (MOBIC ) 15 MG tablet Take 1 tablet (15 mg total) by mouth daily. 12/05/23   Scherrie Lamarr SQUIBB, DPM  Multiple Vitamins-Minerals (ADULT GUMMY PO) Take 2 tablets by mouth daily.    [provider]  NON FORMULARY Testosterone  20 mg/1ml. Use small pea-sized amount  once a week as directed 05/09/23   [provider]  NON FORMULARY Take 5 mg by mouth daily. Fyavolv . Hormone replacement    [provider]  norethindrone -ethinyl estradiol  (FEMHRT  1/5) 1-5 MG-MCG TABS tablet Take 1 tablet by mouth daily.    [provider]  pantoprazole  (PROTONIX ) 40 MG tablet TAKE 1 TABLET BY MOUTH 2 TIMES DAILY. 01/15/24   Chandra Toribio POUR, MD  propranolol  (INDERAL ) 10 MG tablet Take 1-2 tablets at onset of panic no more than 2 times daily 11/16/23   Kober, Charles E, PA-C  SUMAtriptan  (IMITREX ) 100 MG tablet TAKE 1 TABLET BY MOUTH AS NEEDED FOR MIGRAINE. MAY REPEAT IN 2 HOURS IF HEADACHE PERSISTS OR RECURS. MAX 2 TABLETS PER DAY 08/15/23   Gayle Numbers F, PA-C  thiamine  (VITAMIN B1) 100 MG tablet Take 100 mg by mouth daily.    [provider]  traZODone  (DESYREL ) 100 MG tablet Take 100 mg by mouth at bedtime. 10/13/23   [provider]  tretinoin (RETIN-A) 0.025 % cream 1 application in  the evening to face Externally Once a day, PM pea size amount; Duration: 30 days 12/06/21   [provider]    Family History Family History  Problem Relation Age of Onset   Colon cancer Father        Deceased, 68   Osteoporosis Mother        Living, 32   Healthy Brother    Healthy Brother    Healthy Son    Healthy Son    Thyroid  disease Neg Hx    Goiter Neg Hx     Social History Social History   Tobacco Use   Smoking status: Former    Current packs/day: 0.00    Average packs/day: 1 pack/day for 7.0 years (7.0 ttl pk-yrs)    Types: Cigarettes    Start date: 03/01/1967    Quit date: 02/28/1974    Years since quitting: 49.9    Passive exposure: Past   Smokeless tobacco: Never   Tobacco comments:    01/04/2018 smoked when I was a teenager  Vaping Use   Vaping status: Never Used  Substance Use Topics   Alcohol  use: Not Currently    Alcohol /week: 28.0 standard drinks of alcohol     Types: 28 Glasses of wine per week    Comment: I had Detox on 07/13/21 and no alcohol  since then   Drug use: Never     Allergies   Doxycycline, Tetracycline hcl, and Codeine   Review of Systems Review of Systems  Musculoskeletal:        Right rib pain after fall     Physical Exam Triage Vital Signs ED Triage Vitals  Encounter Vitals Group     BP 01/18/24 0958 133/83     Girls Systolic BP Percentile --      Girls Diastolic BP Percentile --      Boys Systolic BP Percentile --      Boys Diastolic BP Percentile --      Pulse Rate 01/18/24 0958 81     Resp 01/18/24 0958 17     Temp 01/18/24 0958 98.9 F (37.2 C)     Temp Source 01/18/24 0958 Oral     SpO2 01/18/24 0958 99 %     Weight --      Height --      Head Circumference --      Peak Flow --      Pain Score 01/18/24 0955 8  Pain Loc --      Pain Education --      Exclude from Growth Chart --    No data found.  Updated Vital Signs BP 133/83   Pulse 81   Temp 98.9 F (37.2 C) (Oral)   Resp 17   SpO2 99%    Visual Acuity Right Eye Distance:   Left Eye Distance:   Bilateral Distance:    Right Eye Near:   Left Eye Near:    Bilateral Near:     Physical Exam Vitals and nursing note reviewed.  Constitutional:      General: She is not in acute distress.    Appearance: Normal appearance. She is not ill-appearing.  HENT:     Head: Normocephalic and atraumatic.  Eyes:     Pupils: Pupils are equal, round, and reactive to light.  Cardiovascular:     Rate and Rhythm: Normal rate and regular rhythm.     Heart sounds: Normal heart sounds.  Pulmonary:     Effort: Pulmonary effort is normal.     Breath sounds: Normal breath sounds. No wheezing or rhonchi.     Comments: Equal chest expansion bilaterally Chest:     Chest wall: Tenderness present.       Comments: No ecchymosis or swelling of the right ribs.  No deformity.  Tender to palpation to the mid posterior right ribs that extends to lower ribs and extends around to lateral ribs. Skin:    General: Skin is warm and dry.  Neurological:     General: No focal deficit present.     Mental Status: She is alert and oriented to person, place, and time.  Psychiatric:        Mood and Affect: Mood normal.        Behavior: Behavior normal.      UC Treatments / Results  Labs (all labs ordered are listed, but only abnormal results are displayed) Labs Reviewed - No data to display  EKG   Radiology DG Ribs Unilateral W/Chest Right Result Date: 01/18/2024 EXAM: 1 VIEW(S) XRAY OF THE BILATERAL RIBS AND CHEST 01/18/2024 10:37:46 AM COMPARISON: 09/29/2023 CLINICAL HISTORY: fall 3 days ago FINDINGS: BONES: Multiple remote bilateral rib fractures with involvement of the lateral/posterolateral right 5th through 9th ribs. There are additional remote appearing fractures of the left posterolateral 5th through 7th ribs with likely additional fractures of the left 3rd and 4th ribs which also appear remote. There is a possible mildly displaced anterior  right rib fracture concerning for acute fracture. LUNGS AND PLEURA: No consolidation or pulmonary edema. No pleural effusion or pneumothorax. HEART AND MEDIASTINUM: No acute abnormality of the cardiac and mediastinal silhouettes. IMPRESSION: 1. Possible mildly displaced anterior right 8th rib fracture, concerning for acute fracture. 2. Multiple remote bilateral rib fractures as above. Electronically signed by: Donnice Mania MD 01/18/2024 11:28 AM EST RP Workstation: [id number]    Procedures Procedures (including critical care time)  Medications Ordered in UC Medications - No data to display  Initial Impression / Assessment and Plan / UC Course  I have reviewed the triage vital signs and the nursing notes.  Pertinent labs & imaging results that were available during my care of the patient were reviewed by me and considered in my medical decision making (see chart for details).     Reviewed exam and symptoms with patient.  X-ray not available onsite at time of evaluation.  Ordered rib x-ray and patient will go to [place] [facility] urgent care  for imaging and I will contact her for any positive results.  Any additional treatment pending these results.  1152: Radiology read shows multiple remote fractures and 1 acute mildly displaced anterior right rib fracture.  Left voicemail for patient to return call to clinic regarding these results.  1518: Contacted patient and verified DOB.  Reviewed x-ray results with patient.  States pain uncontrolled by over-the-counter medication/meloxicam .  Chart review so she has been on low-dose Norco and has tolerated well.  Rx sent to pharmacy.  Discussed splinting and advised PCP follow-up next week for recheck.  Strict ER precautions were reviewed and patient verbalized understanding. Final Clinical Impressions(s) / UC Diagnoses   Final diagnoses:  Rib pain on right side  Fall from standing, initial encounter  Closed fracture of one rib of right side, initial  encounter     Discharge Instructions      Please go to our other clinic to have your x-ray done and I will contact you for any positive results.    ED Prescriptions   None    I have reviewed the PDMP during this encounter.   Loreda Myla SAUNDERS, NP 01/18/24 [phone]

## 2024-01-18 NOTE — ED Triage Notes (Signed)
 Pt states she lost her balance and fell backwards onto a plastic trash can and leaf blower and hit her right posterior rib area on them. Pt denies hitting head. Pt denies LOC. Pt states it's painful in that area upon inhaling.

## 2024-01-18 NOTE — Discharge Instructions (Signed)
 Please go to our other clinic to have your x-ray done and I will contact you for any positive results.

## 2024-01-18 NOTE — Telephone Encounter (Signed)
 See original visit from today for details.  Unable to send Norco via original chart due to chart being closed for greater than 2 hours.

## 2024-02-11 ENCOUNTER — Other Ambulatory Visit: Payer: Self-pay

## 2024-02-11 DIAGNOSIS — F1020 Alcohol dependence, uncomplicated: Secondary | ICD-10-CM

## 2024-02-11 DIAGNOSIS — G621 Alcoholic polyneuropathy: Secondary | ICD-10-CM

## 2024-02-11 DIAGNOSIS — I10 Essential (primary) hypertension: Secondary | ICD-10-CM

## 2024-02-12 ENCOUNTER — Other Ambulatory Visit

## 2024-02-19 ENCOUNTER — Ambulatory Visit

## 2024-03-01 ENCOUNTER — Other Ambulatory Visit: Payer: Self-pay

## 2024-03-01 DIAGNOSIS — I10 Essential (primary) hypertension: Secondary | ICD-10-CM

## 2024-03-04 ENCOUNTER — Ambulatory Visit
Admission: EM | Admit: 2024-03-04 | Discharge: 2024-03-04 | Disposition: A | Discharge status: Home / Self Care | Attending: Family Medicine | Admitting: Family Medicine

## 2024-03-04 ENCOUNTER — Ambulatory Visit (INDEPENDENT_AMBULATORY_CARE_PROVIDER_SITE_OTHER)

## 2024-03-04 ENCOUNTER — Encounter: Payer: Self-pay | Admitting: Emergency Medicine

## 2024-03-04 ENCOUNTER — Ambulatory Visit: Payer: Self-pay | Admitting: Nurse Practitioner

## 2024-03-04 DIAGNOSIS — M25531 Pain in right wrist: Secondary | ICD-10-CM

## 2024-03-04 DIAGNOSIS — S62231A Other displaced fracture of base of first metacarpal bone, right hand, initial encounter for closed fracture: Secondary | ICD-10-CM

## 2024-03-04 MED ORDER — HYDROCODONE-ACETAMINOPHEN 5-325 MG PO TABS: 1.0000 | ORAL_TABLET | Freq: Four times a day (QID) | ORAL | 0 refills | Status: DC | PRN

## 2024-03-04 NOTE — ED Triage Notes (Signed)
 Pt st's she fell approx 3 weeks ago on right hand   St's she has had pain since then

## 2024-03-04 NOTE — ED Provider Notes (Signed)
 " [facility]    CSN: [id number] Arrival date & time: 03/04/24  1342      History   Chief Complaint Chief Complaint  Patient presents with   Hand Injury    HPI Terri Ayala is a 69 y.o. female presents for hand pain.  Patient reports 2 weeks ago she fell landing onto her outstretched right hand.  States since then she has been having pain to the base of her thumb that radiates into her wrist.  States it was swollen and bruised initially but this has nearly resolved.  Does have reduced range of motion of the thumb.  Denies numbness tingling.  Is not on blood thinning medications.  No history of fractures or surgeries to the hand in the past.  She has been taking meloxicam  with minimal improvement of her pain.  No other concerns at this time.   Hand Injury   Past Medical History:  Diagnosis Date   Alcohol  withdrawal (HCC) 11/2017; 01/04/2018   Alcoholism (HCC)    Anxiety    Arthritis    hands (01/04/2018)   ASYMPTOMATIC POSTMENOPAUSAL STATUS 08/17/2009   Chronic lower back pain    Depression    DYSPHAGIA 06/19/2007   Fatty liver, alcoholic    GERD (gastroesophageal reflux disease)    GOITER, MULTINODULAR 08/17/2009   History of colonic polyps 03/25/2020   Hyperlipidemia    Hypertension    Migraines    not sure what triggers them; I'll have 1 q couple weeks or more; come 2 days in a row when they come (01/04/2018)   Mitral valve prolapse    OSTEOPOROSIS 08/17/2009   PONV (postoperative nausea and vomiting)    Supraventricular tachycardia     Patient Active Problem List   Diagnosis Date Noted   Contusion of toe 12/05/2023   Diarrhea, unspecified 12/05/2023   Dysphagia, unspecified 12/05/2023   Family history of malignant neoplasm of colon 12/05/2023   Preoperative clearance 10/19/2023   Malnutrition of moderate degree 07/28/2023   Alcohol  withdrawal (HCC) 07/24/2023   Elevated TSH 07/24/2023   Sensorineural hearing loss, bilateral 01/01/2023   Foreign  body of right ear 01/01/2023   Alcoholic peripheral neuropathy 12/28/2022   Mild cognitive impairment 10/17/2022   Balance problem 05/23/2022   Abnormal movement 05/23/2022   Weakness of both hands 05/23/2022   Rosacea 05/17/2021   Seborrheic keratosis 05/17/2021   Melanocytic nevi of trunk 05/17/2021   Chronic pain of left ankle 05/12/2021   Body mass index 26.0-26.9, adult 05/12/2021   Thyroid  nodule 02/10/2021   Prolonged QT interval 02/10/2021   Elevated cholesterol 10/19/2020   Gastroparesis 03/25/2020   Gastroesophageal reflux disease without esophagitis 03/25/2020   Elevated LFTs 07/12/2018   Left rib fracture 01/04/2018   Leukocytosis 12/08/2017   Benign essential HTN 12/08/2017   Osteopenia 08/27/2016   Major depressive disorder, recurrent episode, moderate (HCC) 01/27/2016   Alcohol  dependence with withdrawal (HCC) 01/27/2016   Uncomplicated alcohol  dependence (HCC) 04/03/2014   Lumbosacral radiculopathy at L5 05/20/2013   Protein-calorie malnutrition, severe 03/18/2013   Nonspecific elevation of levels of transaminase or lactic acid dehydrogenase (LDH) 03/16/2013   Hepatic steatosis 03/16/2013   Chronic cholecystitis 03/14/2013   Cervical arthritis 10/12/2012   Palpitations 11/29/2010   Supraventricular tachycardia 11/29/2010   Mitral valve prolapse 11/29/2010   Decreased libido 11/15/2010   Multinodular goiter 08/17/2009   Migraine 08/17/2009   Hearing loss 08/17/2009   Osteoporosis 08/17/2009   Generalized anxiety disorder 08/17/2009   Eosinophilic esophagitis  06/19/2007    Past Surgical History:  Procedure Laterality Date   BREAST BIOPSY Right    benign   CATARACT EXTRACTION W/ INTRAOCULAR LENS  IMPLANT, BILATERAL     CERVICAL CONE BIOPSY  2000s   CERVIX LESION DESTRUCTION  1991   dysplasia; lesions   CHOLECYSTECTOMY N/A 03/14/2013   Procedure: LAPAROSCOPIC CHOLECYSTECTOMY WITH ATTEMPTED INTRAOPERATIVE CHOLANGIOGRAM;  Surgeon: Vicenta DELENA Poli,  MD;  Location: [facility] OR;  Service: General;  Laterality: N/A;   HARDWARE REMOVAL Left 07/22/2021   Procedure: HARDWARE REMOVAL;  Surgeon: Celena Sharper, MD;  Location: [facility] OR;  Service: Orthopedics;  Laterality: Left;   laporoscopic abdominal surgery     for endometriosis   LEFT HEART CATH AND CORONARY ANGIOGRAPHY N/A 05/07/2018   Procedure: LEFT HEART CATH AND CORONARY ANGIOGRAPHY;  Surgeon: Claudene Victory ORN, MD;  Location: [facility];  Service: Cardiovascular;  Laterality: N/A;   ORIF ANKLE FRACTURE Left 02/11/2021   Procedure: OPEN REDUCTION INTERNAL FIXATION (ORIF) ANKLE FRACTURE;  Surgeon: Celena Sharper, MD;  Location: [facility] OR;  Service: Orthopedics;  Laterality: Left;   TONSILLECTOMY      OB History   No obstetric history on file.      Home Medications    Prior to Admission medications  Medication Sig Start Date End Date Taking? Authorizing Provider  HYDROcodone -acetaminophen  (NORCO/VICODIN) 5-325 MG tablet Take 1 tablet by mouth every 6 (six) hours as needed for severe pain (pain score 7-10). 03/04/24  Yes Wileen Duncanson, Jodi R, NP  albuterol  (VENTOLIN  HFA) 108 (90 Base) MCG/ACT inhaler INHALE 2 PUFFS INTO THE LUNGS EVERY 6 HOURS AS NEEDED FOR WHEEZING OR SHORTNESS OF BREATH. 11/29/23   Gayle Numbers F, PA-C  alendronate  (FOSAMAX ) 70 MG tablet Take 70 mg by mouth every Monday.    [provider]  baclofen  (LIORESAL ) 10 MG tablet Take 1 tablet (10 mg total) by mouth 3 (three) times daily. 09/14/23 03/12/24  Kober, Charles E, PA-C  buPROPion  (WELLBUTRIN  XL) 150 MG 24 hr tablet Take 1 tablet (150 mg total) by mouth every morning. 09/14/23 03/12/24  Kober, Charles E, PA-C  Calcium  Carbonate-Vitamin D  (CALCIUM  600+D PO) Take 1 tablet by mouth daily.    [provider]  carvedilol  (COREG ) 6.25 MG tablet TAKE 1 TABLET BY MOUTH 2 TIMES DAILY WITH A MEAL. 03/01/24   Gayle, Kara F, PA-C  feeding supplement (ENSURE PLUS HIGH PROTEIN) LIQD Take 237 mLs by mouth 3 (three) times daily between  meals. 08/07/23   Ghimire, Donalda HERO, MD  fluticasone  (FLONASE ) 50 MCG/ACT nasal spray Place 2 sprays into both nostrils daily as needed for allergies.    [provider]  folic acid  (FOLVITE ) 1 MG tablet TAKE 1 TABLET BY MOUTH DAILY. 01/15/24   Chandra Toribio POUR, MD  gabapentin  (NEURONTIN ) 300 MG capsule Take 1 capsule (300 mg total) by mouth 3 (three) times daily. 10/19/23   Gayle Numbers FALCON, PA-C  hydrOXYzine  (ATARAX ) 25 MG tablet Take 25 mg by mouth 3 (three) times daily. 01/18/23   [provider]  meloxicam  (MOBIC ) 15 MG tablet Take 15 mg by mouth daily. Patient taking differently: Take 15 mg by mouth 2 (two) times daily as needed.    [provider]  meloxicam  (MOBIC ) 15 MG tablet Take 1 tablet (15 mg total) by mouth daily. 12/05/23   Scherrie Lamarr SQUIBB, DPM  Multiple Vitamins-Minerals (ADULT GUMMY PO) Take 2 tablets by mouth daily.    [provider]  NON FORMULARY Testosterone  20 mg/1ml. Use  small pea-sized amount once a week as directed 05/09/23   [provider]  NON FORMULARY Take 5 mg by mouth daily. Fyavolv . Hormone replacement    [provider]  norethindrone -ethinyl estradiol  (FEMHRT  1/5) 1-5 MG-MCG TABS tablet Take 1 tablet by mouth daily.    [provider]  pantoprazole  (PROTONIX ) 40 MG tablet TAKE 1 TABLET BY MOUTH 2 TIMES DAILY. 01/15/24   Chandra Toribio POUR, MD  propranolol  (INDERAL ) 10 MG tablet Take 1-2 tablets at onset of panic no more than 2 times daily 11/16/23   Kober, Charles E, PA-C  SUMAtriptan  (IMITREX ) 100 MG tablet TAKE 1 TABLET BY MOUTH AS NEEDED FOR MIGRAINE. MAY REPEAT IN 2 HOURS IF HEADACHE PERSISTS OR RECURS. MAX 2 TABLETS PER DAY 08/15/23   Gayle Numbers F, PA-C  thiamine  (VITAMIN B1) 100 MG tablet Take 100 mg by mouth daily.    [provider]  traZODone  (DESYREL ) 100 MG tablet Take 100 mg by mouth at bedtime. 10/13/23   [provider]  tretinoin (RETIN-A) 0.025 % cream 1 application in the  evening to face Externally Once a day, PM pea size amount; Duration: 30 days 12/06/21   [provider]    Family History Family History  Problem Relation Age of Onset   Colon cancer Father        Deceased, 76   Osteoporosis Mother        Living, 63   Healthy Brother    Healthy Brother    Healthy Son    Healthy Son    Thyroid  disease Neg Hx    Goiter Neg Hx     Social History Social History[1]   Allergies   Doxycycline, Tetracycline hcl, and Codeine   Review of Systems Review of Systems  Musculoskeletal:        Right hand pain     Physical Exam Triage Vital Signs ED Triage Vitals  Encounter Vitals Group     BP 03/04/24 1506 (!) 169/82     Girls Systolic BP Percentile --      Girls Diastolic BP Percentile --      Boys Systolic BP Percentile --      Boys Diastolic BP Percentile --      Pulse Rate 03/04/24 1506 74     Resp 03/04/24 1506 16     Temp 03/04/24 1506 98 F (36.7 C)     Temp Source 03/04/24 1506 Oral     SpO2 03/04/24 1506 97 %     Weight --      Height --      Head Circumference --      Peak Flow --      Pain Score 03/04/24 1522 8     Pain Loc --      Pain Education --      Exclude from Growth Chart --    No data found.  Updated Vital Signs BP (!) 169/82 (BP Location: Right Arm)   Pulse 74   Temp 98 F (36.7 C) (Oral)   Resp 16   SpO2 97%   Visual Acuity Right Eye Distance:   Left Eye Distance:   Bilateral Distance:    Right Eye Near:   Left Eye Near:    Bilateral Near:     Physical Exam Vitals and nursing note reviewed.  Constitutional:      General: She is not in acute distress.    Appearance: Normal appearance. She is not ill-appearing.  HENT:  Head: Normocephalic and atraumatic.  Eyes:     Pupils: Pupils are equal, round, and reactive to light.  Cardiovascular:     Rate and Rhythm: Normal rate.  Pulmonary:     Effort: Pulmonary effort is normal.  Musculoskeletal:       Hands:     Comments: There is  mild swelling of the proximal right thumb with reduced range of motion.  Moderate pain with palpation to the base of the thumb.  Cap refill +2.  Skin is warm and dry.  No bruising.  Skin:    General: Skin is warm and dry.  Neurological:     General: No focal deficit present.     Mental Status: She is alert and oriented to person, place, and time.  Psychiatric:        Mood and Affect: Mood normal.        Behavior: Behavior normal.      UC Treatments / Results  Labs (all labs ordered are listed, but only abnormal results are displayed) Labs Reviewed - No data to display  EKG   Radiology No results found.  Procedures Procedures (including critical care time)  Medications Ordered in UC Medications - No data to display  Initial Impression / Assessment and Plan / UC Course  I have reviewed the triage vital signs and the nursing notes.  Pertinent labs & imaging results that were available during my care of the patient were reviewed by me and considered in my medical decision making (see chart for details).     Reviewed exam and symptoms with patient.  Wet read of x-ray shows displaced fracture of the base of the first metacarpal of her right hand.  As it has been 3 weeks we will place her in thumb spica and have her follow-up with orthopedics, referral placed.  Rx Norco for severe pain, PDMP reviewed and side effect profile reviewed.  PCP follow-up 2 to 3 days for recheck and strict ER precautions reviewed and patient verbalized understanding. Final Clinical Impressions(s) / UC Diagnoses   Final diagnoses:  Right wrist pain  Closed displaced fracture of base of first metacarpal bone of right hand, unspecified fracture morphology, initial encounter     Discharge Instructions      Please keep your hand in the splint applied here until you see orthopedics.  I have put in a referral for orthopedics and they should be contacting you in the next day or 2 to set up an appointment.   If you do not hear from them by end of day tomorrow please contact their office and schedule your appointment.  You may elevate and ice as needed.  You may take hydrocodone  as needed for severe pain.  This does have Tylenol  in it.  Do not take additional Tylenol  while you are taking this medication.  You may take ibuprofen  if needed.  Please go to the ER if you develop any worsening symptoms prior to seeing orthopedics.  This includes but is not limited to uncontrolled pain, persistent numbness or tingling, or any new concerns that arise.  Please follow-up with your PCP in 2 to 3 days for recheck.  I hope you feel better soon!     ED Prescriptions     Medication Sig Dispense Auth. Provider   HYDROcodone -acetaminophen  (NORCO/VICODIN) 5-325 MG tablet Take 1 tablet by mouth every 6 (six) hours as needed for severe pain (pain score 7-10). 5 tablet Season Astacio, Jodi R, NP      I  have reviewed the PDMP during this encounter.     [1]  Social History Tobacco Use   Smoking status: Former    Current packs/day: 0.00    Average packs/day: 1 pack/day for 7.0 years (7.0 ttl pk-yrs)    Types: Cigarettes    Start date: 03/01/1967    Quit date: 02/28/1974    Years since quitting: 50.0    Passive exposure: Past   Smokeless tobacco: Never   Tobacco comments:    01/04/2018 smoked when I was a teenager  Vaping Use   Vaping status: Never Used  Substance Use Topics   Alcohol  use: Not Currently    Alcohol /week: 28.0 standard drinks of alcohol     Types: 28 Glasses of wine per week    Comment: I had Detox on 07/13/21 and no alcohol  since then   Drug use: Never     Terri Myla SAUNDERS, NP 03/04/24 1544  "

## 2024-03-04 NOTE — Discharge Instructions (Addendum)
 Please keep your hand in the splint applied here until you see orthopedics.  I have put in a referral for orthopedics and they should be contacting you in the next day or 2 to set up an appointment.  If you do not hear from them by end of day tomorrow please contact their office and schedule your appointment.  You may elevate and ice as needed.  You may take hydrocodone  as needed for severe pain.  This does have Tylenol  in it.  Do not take additional Tylenol  while you are taking this medication.  You may take ibuprofen  if needed.  Please go to the ER if you develop any worsening symptoms prior to seeing orthopedics.  This includes but is not limited to uncontrolled pain, persistent numbness or tingling, or any new concerns that arise.  Please follow-up with your PCP in 2 to 3 days for recheck.  I hope you feel better soon!

## 2024-03-05 ENCOUNTER — Other Ambulatory Visit: Payer: Self-pay

## 2024-03-05 DIAGNOSIS — G621 Alcoholic polyneuropathy: Secondary | ICD-10-CM

## 2024-03-05 DIAGNOSIS — F1020 Alcohol dependence, uncomplicated: Secondary | ICD-10-CM

## 2024-03-07 ENCOUNTER — Other Ambulatory Visit

## 2024-03-07 ENCOUNTER — Telehealth (HOSPITAL_COMMUNITY): Admitting: Medical

## 2024-03-07 DIAGNOSIS — Z62819 Personal history of unspecified abuse in childhood: Secondary | ICD-10-CM | POA: Diagnosis not present

## 2024-03-07 DIAGNOSIS — Z8669 Personal history of other diseases of the nervous system and sense organs: Secondary | ICD-10-CM | POA: Diagnosis not present

## 2024-03-07 DIAGNOSIS — K709 Alcoholic liver disease, unspecified: Secondary | ICD-10-CM | POA: Diagnosis not present

## 2024-03-07 DIAGNOSIS — F4312 Post-traumatic stress disorder, chronic: Secondary | ICD-10-CM | POA: Diagnosis not present

## 2024-03-07 DIAGNOSIS — Z79899 Other long term (current) drug therapy: Secondary | ICD-10-CM | POA: Diagnosis not present

## 2024-03-07 DIAGNOSIS — F1291 Cannabis use, unspecified, in remission: Secondary | ICD-10-CM

## 2024-03-07 DIAGNOSIS — Z8781 Personal history of (healed) traumatic fracture: Secondary | ICD-10-CM | POA: Diagnosis not present

## 2024-03-07 DIAGNOSIS — F1021 Alcohol dependence, in remission: Secondary | ICD-10-CM

## 2024-03-07 DIAGNOSIS — F4320 Adjustment disorder, unspecified: Secondary | ICD-10-CM | POA: Diagnosis not present

## 2024-03-07 DIAGNOSIS — L718 Other rosacea: Secondary | ICD-10-CM | POA: Diagnosis not present

## 2024-03-07 DIAGNOSIS — Z8659 Personal history of other mental and behavioral disorders: Secondary | ICD-10-CM | POA: Diagnosis not present

## 2024-03-07 DIAGNOSIS — Z9889 Other specified postprocedural states: Secondary | ICD-10-CM

## 2024-03-07 NOTE — Progress Notes (Signed)
 [facility] MD/PA/NP OP Progress Note  03/07/2024 6:04 PM Terri Ayala  MRN:  [id number] Virtual Visit via Video Note  I connected with Terri Ayala on 03/07/2024 at  4:30 PM EST by a video enabled telemedicine application and verified that I am speaking with the correct person using two identifiers.  Location: Patient: At Home Provider: [facility]   I discussed the limitations of evaluation and management by telemedicine and the availability of in person appointments. The patient expressed understanding and agreed to proceed.   History of Present Illness:See EPIC note    Observations/Objective:See EPIC note   Assessment and Plan:See EPIC note   Follow Up Instructions:See EPIC note  I discussed the assessment and treatment plan with the patient. The patient was provided an opportunity to ask questions and all were answered. The patient agreed with the plan and demonstrated an understanding of the instructions.   The patient was advised to call back or seek an in-person evaluation if the symptoms worsen or if the condition fails to improve as anticipated.  I provided 20 minutes of non-face-to-face time during this encounter.   Carlin Emmer, PA-C   Chief Complaint:  Chief Complaint  Patient presents with   Follow-up   Alcohol  Problem   Cannabis problem   Trauma   Stress   Anxiety   Depression   Medication Refill   HPI: *** Visit Diagnosis:    ICD-10-CM   1. Alcohol  use disorder, severe, in early remission (HCC)  F10.21     2. Cannabis use disorder in remission  F12.91     3. Trauma in childhood  Z62.819     4. Chronic post-traumatic stress disorder (PTSD)  F43.12     5. Grief reaction  F43.20     6. History of major depression  Z86.59     7. Alcoholic liver disorder  K70.9     8. Status post open reduction with internal fixation (ORIF) of fracture of ankle  Z98.890    Z87.81     9. Hx of migraines  Z86.69     10. Rosacea keratitis  L71.8     11. Medication  management  Z79.899       Past Psychiatric History: ***  Past Medical History:  Past Medical History:  Diagnosis Date   Alcohol  withdrawal (HCC) 11/2017; 01/04/2018   Alcoholism (HCC)    Anxiety    Arthritis    hands (01/04/2018)   ASYMPTOMATIC POSTMENOPAUSAL STATUS 08/17/2009   Chronic lower back pain    Depression    DYSPHAGIA 06/19/2007   Fatty liver, alcoholic    GERD (gastroesophageal reflux disease)    GOITER, MULTINODULAR 08/17/2009   History of colonic polyps 03/25/2020   Hyperlipidemia    Hypertension    Migraines    not sure what triggers them; I'll have 1 q couple weeks or more; come 2 days in a row when they come (01/04/2018)   Mitral valve prolapse    OSTEOPOROSIS 08/17/2009   PONV (postoperative nausea and vomiting)    Supraventricular tachycardia     Past Surgical History:  Procedure Laterality Date   BREAST BIOPSY Right    benign   CATARACT EXTRACTION W/ INTRAOCULAR LENS  IMPLANT, BILATERAL     CERVICAL CONE BIOPSY  2000s   CERVIX LESION DESTRUCTION  1991   dysplasia; lesions   CHOLECYSTECTOMY N/A 03/14/2013   Procedure: LAPAROSCOPIC CHOLECYSTECTOMY WITH ATTEMPTED INTRAOPERATIVE CHOLANGIOGRAM;  Surgeon: Vicenta DELENA Poli, MD;  Location: [facility] OR;  Service: General;  Laterality: N/A;   HARDWARE REMOVAL Left 07/22/2021   Procedure: HARDWARE REMOVAL;  Surgeon: Celena Sharper, MD;  Location: [facility] OR;  Service: Orthopedics;  Laterality: Left;   laporoscopic abdominal surgery     for endometriosis   LEFT HEART CATH AND CORONARY ANGIOGRAPHY N/A 05/07/2018   Procedure: LEFT HEART CATH AND CORONARY ANGIOGRAPHY;  Surgeon: Claudene Victory ORN, MD;  Location: [facility] INVASIVE CV LAB;  Service: Cardiovascular;  Laterality: N/A;   ORIF ANKLE FRACTURE Left 02/11/2021   Procedure: OPEN REDUCTION INTERNAL FIXATION (ORIF) ANKLE FRACTURE;  Surgeon: Celena Sharper, MD;  Location: [facility] OR;  Service: Orthopedics;  Laterality: Left;   TONSILLECTOMY      Family Psychiatric History:  ***  Family History:  Family History  Problem Relation Age of Onset   Colon cancer Father        Deceased, 24   Osteoporosis Mother        Living, 68   Healthy Brother    Healthy Brother    Healthy Son    Healthy Son    Thyroid  disease Neg Hx    Goiter Neg Hx     Social History:  Social History   Socioeconomic History   Marital status: Single    Spouse name: Not on file   Number of children: Not on file   Years of education: Not on file   Highest education level: Bachelor's degree (e.g., BA, AB, BS)  Occupational History   Occupation: Hydrographic Surveyor - retired    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[employer]  Tobacco Use   Smoking status: Former    Current packs/day: 0.00    Average packs/day: 1 pack/day for 7.0 years (7.0 ttl pk-yrs)    Types: Cigarettes    Start date: 03/01/1967    Quit date: 02/28/1974    Years since quitting: 50.0    Passive exposure: Past   Smokeless tobacco: Never   Tobacco comments:    01/04/2018 smoked when I was a teenager  Vaping Use   Vaping status: Never Used  Substance and Sexual Activity   Alcohol  use: Not Currently    Alcohol /week: 28.0 standard drinks of alcohol     Types: 28 Glasses of wine per week    Comment: I had Detox on 07/13/21 and no alcohol  since then   Drug use: Never   Sexual activity: Not Currently  Other Topics Concern   Not on file  Social History Narrative   Lives alone.  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      Right Handed    Lives in a one story home   Social Drivers of Health   Tobacco Use: Medium Risk (03/04/2024)   Patient History    Smoking Tobacco Use: Former    Smokeless Tobacco Use: Never    Passive Exposure: Past  Physicist, Medical Strain: Medium Risk (10/18/2023)   Overall Financial Resource Strain (CARDIA)    Difficulty of Paying Living Expenses: Somewhat hard  Food Insecurity: Food Insecurity Present (10/18/2023)   Epic    Worried About Programme Researcher, Broadcasting/film/video in  the Last Year: Sometimes true    Ran Out of Food in the Last Year: Sometimes true  Transportation Needs: Patient Declined (10/18/2023)   Epic    Lack of Transportation (Medical): Patient declined    Lack of Transportation (Non-Medical): Patient declined  Physical Activity: Insufficiently Active (10/18/2023)   Exercise Vital Sign    Days of Exercise per Week:  1 day    Minutes of Exercise per Session: 20 min  Stress: Stress Concern Present (10/18/2023)   [employer] of Occupational Health - Occupational Stress Questionnaire    Feeling of Stress: To some extent  Social Connections: Moderately Isolated (10/18/2023)   Social Connection and Isolation Panel    Frequency of Communication with Friends and Family: Twice a week    Frequency of Social Gatherings with Friends and Family: Once a week    Attends Religious Services: 1 to 4 times per year    Active Member of [employer] or Organizations: No    Attends Engineer, Structural: Not on file    Marital Status: Never married  Depression (PHQ2-9): Low Risk (05/01/2023)   Depression (PHQ2-9)    PHQ-2 Score: 0  Alcohol  Screen: Low Risk (10/18/2023)   Alcohol  Screen    Last Alcohol  Screening Score (AUDIT): 2  Housing: Low Risk (10/18/2023)   Epic    Unable to Pay for Housing in the Last Year: No    Number of Times Moved in the Last Year: 0    Homeless in the Last Year: No  Utilities: Not At Risk (09/28/2023)   Epic    Threatened with loss of utilities: No  Health Literacy: Adequate Health Literacy (01/23/2023)   B1300 Health Literacy    Frequency of need for help with medical instructions: Never    Allergies: Allergies[1]  Metabolic Disorder Labs: Lab Results  Component Value Date   HGBA1C 5.3 10/19/2023   MPG 88.19 11/21/2020   MPG 97 03/19/2013   No results found for: PROLACTIN Lab Results  Component Value Date   CHOL 193 04/22/2022   TRIG 104 04/22/2022   HDL 49 04/22/2022   CHOLHDL 3.9 04/22/2022   VLDL 10 10/04/2012    LDLCALC 125 (H) 04/22/2022   LDLCALC 168 (H) 12/15/2021   Lab Results  Component Value Date   TSH 6.915 (H) 07/24/2023   TSH 3.018 02/16/2022    Therapeutic Level Labs: No results found for: LITHIUM No results found for: VALPROATE No results found for: CBMZ  Current Medications: Current Outpatient Medications  Medication Sig Dispense Refill   busPIRone  (BUSPAR ) 15 MG tablet Take 15 mg by mouth 3 (three) times daily.     Testosterone  20.25 MG/1.25GM (1.62%) GEL Place onto the skin.     albuterol  (VENTOLIN  HFA) 108 (90 Base) MCG/ACT inhaler INHALE 2 PUFFS INTO THE LUNGS EVERY 6 HOURS AS NEEDED FOR WHEEZING OR SHORTNESS OF BREATH. 6.7 g 2   alendronate  (FOSAMAX ) 70 MG tablet Take 70 mg by mouth every Monday.     baclofen  (LIORESAL ) 10 MG tablet Take 1 tablet (10 mg total) by mouth 3 (three) times daily. 270 tablet 1   buPROPion  (WELLBUTRIN  XL) 150 MG 24 hr tablet Take 1 tablet (150 mg total) by mouth every morning. 90 tablet 1   Calcium  Carbonate-Vitamin D  (CALCIUM  600+D PO) Take 1 tablet by mouth daily.     carvedilol  (COREG ) 6.25 MG tablet TAKE 1 TABLET BY MOUTH 2 TIMES DAILY WITH A MEAL. 180 tablet 1   feeding supplement (ENSURE PLUS HIGH PROTEIN) LIQD Take 237 mLs by mouth 3 (three) times daily between meals.     fluticasone  (FLONASE ) 50 MCG/ACT nasal spray Place 2 sprays into both nostrils daily as needed for allergies.     folic acid  (FOLVITE ) 1 MG tablet TAKE 1 TABLET BY MOUTH DAILY. 30 tablet 3   gabapentin  (NEURONTIN ) 300 MG capsule TAKE 1 CAPSULE BY MOUTH  3 TIMES DAILY. 90 capsule 3   hydrOXYzine  (ATARAX ) 25 MG tablet Take 25 mg by mouth 3 (three) times daily.     meloxicam  (MOBIC ) 15 MG tablet Take 15 mg by mouth daily. (Patient taking differently: Take 15 mg by mouth 2 (two) times daily as needed.)     meloxicam  (MOBIC ) 15 MG tablet Take 1 tablet (15 mg total) by mouth daily. 30 tablet 2   Multiple Vitamins-Minerals (ADULT GUMMY PO) Take 2 tablets by mouth daily.      NON FORMULARY Testosterone  20 mg/73ml. Use small pea-sized amount once a week as directed     NON FORMULARY Take 5 mg by mouth daily. Fyavolv . Hormone replacement     norethindrone -ethinyl estradiol  (FEMHRT  1/5) 1-5 MG-MCG TABS tablet Take 1 tablet by mouth daily.     pantoprazole  (PROTONIX ) 40 MG tablet TAKE 1 TABLET BY MOUTH 2 TIMES DAILY. 180 tablet 1   propranolol  (INDERAL ) 10 MG tablet Take 1-2 tablets at onset of panic no more than 2 times daily 180 tablet 0   SUMAtriptan  (IMITREX ) 100 MG tablet TAKE 1 TABLET BY MOUTH AS NEEDED FOR MIGRAINE. MAY REPEAT IN 2 HOURS IF HEADACHE PERSISTS OR RECURS. MAX 2 TABLETS PER DAY 15 tablet 1   thiamine  (VITAMIN B1) 100 MG tablet Take 100 mg by mouth daily.     traZODone  (DESYREL ) 100 MG tablet Take 100 mg by mouth at bedtime.     tretinoin (RETIN-A) 0.025 % cream 1 application in the evening to face Externally Once a day, PM pea size amount; Duration: 30 days     No current facility-administered medications for this visit.    Musculoskeletal: Strength & Muscle Tone: Telepsych visit-Grossly normal Musculoskeletal and cranial nerve inspections Gait & Station: NA Patient leans: N/A  Psychiatric Specialty Exam: Review of Systems  There were no vitals taken for this visit.There is no height or weight on file to calculate BMI.My Chart Visit  General Appearance: {Appearance:22683}  Eye Contact:  {BHH EYE CONTACT:22684}  Speech:  {Speech:22685}  Volume:  {Volume (PAA):22686}  Mood:  {BHH MOOD:22306}  Affect:  {Affect (PAA):22687}  Thought Process:  {Thought Process (PAA):22688}  Orientation:  {BHH ORIENTATION (PAA):22689}  Thought Content: {Thought Content:22690}   Suicidal Thoughts:  {ST/HT (PAA):22692}  Homicidal Thoughts:  {ST/HT (PAA):22692}  Memory:  {BHH MEMORY:22881}  Judgement:  {Judgement (PAA):22694}  Insight:  {Insight (PAA):22695}  Psychomotor Activity:  {Psychomotor (PAA):22696}  Concentration:  {Concentration:21399}  Recall:   {BHH GOOD/FAIR/POOR:22877}  Fund of Knowledge: {BHH GOOD/FAIR/POOR:22877}  Language: {BHH GOOD/FAIR/POOR:22877}  Akathisia:  {BHH YES OR NO:22294}  Handed:  {Handed:22697}  AIMS (if indicated): {Desc; done/not:10129}  Assets:  {Assets (PAA):22698}  ADL's:  {BHH JIO'D:77709}  Cognition: {chl bhh cognition:304700322}  Sleep:  {BHH GOOD/FAIR/POOR:22877}   Screenings: AUDIT    Flowsheet Row [facility]) [facility]  Alcohol  Use Disorder Identification Test Final Score (AUDIT) 18 21   GAD-7    Flowsheet Row Office Visit from 05/01/2023 in [facility] at [facility] Visit from 12/28/2022 in [facility] at [facility] Visit from 10/17/2022 in [facility] Care at [facility] Visit from 08/23/2022 in [facility] Primary Care at [facility] Clinical Support from 12/07/2021 in [facility] Care at [facility]  Total GAD-7 Score 0 2 4 1  0   [employer]    Flowsheet Row Office  Visit from 05/01/2023 in [facility] Care at [facility] Clinical Support from 01/23/2023 in [facility] Primary Care at [facility] Office Visit from 12/28/2022 in [facility] Care at [facility] Office Visit from 10/17/2022 in [facility] Primary Care at [facility] Office Visit from 08/23/2022 in [facility] Primary Care at [facility]  PHQ-2 Total Score 0 0 0 1 0  PHQ-9 Total Score 0 0 4 5 2    Flowsheet Row UC from 03/04/2024 in [facility]) UC from 01/18/2024 in [facility] at [facility]) ED to Hosp-Admission (Discharged) from 09/28/2023 in [place] [facility]  C-SSRS RISK CATEGORY Error: Question 6 not populated No Risk No Risk     Assessment and Plan: ***  Collaboration of Care: Collaboration of Care: [facility] OP  Collaboration of Rjmz:[id number]}  Patient/Guardian was advised Release of Information must be obtained prior to any record release in order to collaborate their care with an outside provider. Patient/Guardian was advised if they have not already done so to contact the registration department to sign all necessary forms in order for us  to release information regarding their care.   Consent: Patient/Guardian gives verbal consent for treatment and assignment of benefits for services provided during this visit. Patient/Guardian expressed understanding and agreed to proceed.    Carlin Emmer, PA-C 03/07/2024, 6:04 PM     [1]  Allergies Allergen Reactions   Doxycycline Swelling    Mouth swelling and sores     Tetracycline Hcl Swelling and Other (See Comments)    Mouth swelling and sores   Codeine Itching

## 2024-03-08 ENCOUNTER — Ambulatory Visit: Payer: Self-pay

## 2024-03-09 LAB — CBC WITH DIFFERENTIAL/PLATELET
Basophils Absolute: 0.1 x10E3/uL (ref 0.0–0.2)
Basos: 1 %
EOS (ABSOLUTE): 0.2 x10E3/uL (ref 0.0–0.4)
Eos: 3 %
Hematocrit: 34.3 % (ref 34.0–46.6)
Hemoglobin: 11.2 g/dL (ref 11.1–15.9)
Immature Grans (Abs): 0 x10E3/uL (ref 0.0–0.1)
Immature Granulocytes: 0 %
Lymphocytes Absolute: 1.7 x10E3/uL (ref 0.7–3.1)
Lymphs: 31 %
MCH: 30.3 pg (ref 26.6–33.0)
MCHC: 32.7 g/dL (ref 31.5–35.7)
MCV: 93 fL (ref 79–97)
Monocytes Absolute: 0.8 x10E3/uL (ref 0.1–0.9)
Monocytes: 14 %
Neutrophils Absolute: 2.8 x10E3/uL (ref 1.4–7.0)
Neutrophils: 51 %
Platelets: 223 x10E3/uL (ref 150–450)
RBC: 3.7 x10E6/uL — ABNORMAL LOW (ref 3.77–5.28)
RDW: 14 % (ref 11.7–15.4)
WBC: 5.6 x10E3/uL (ref 3.4–10.8)

## 2024-03-09 LAB — COMPREHENSIVE METABOLIC PANEL WITH GFR
ALT: 6 IU/L (ref 0–32)
AST: 16 IU/L (ref 0–40)
Albumin: 4.2 g/dL (ref 3.9–4.9)
Alkaline Phosphatase: 59 IU/L (ref 49–135)
BUN/Creatinine Ratio: 22 (ref 12–28)
BUN: 16 mg/dL (ref 8–27)
Bilirubin Total: 0.3 mg/dL (ref 0.0–1.2)
CO2: 24 mmol/L (ref 20–29)
Calcium: 8.8 mg/dL (ref 8.7–10.3)
Chloride: 99 mmol/L (ref 96–106)
Creatinine, Ser: 0.72 mg/dL (ref 0.57–1.00)
Globulin, Total: 2.2 g/dL (ref 1.5–4.5)
Glucose: 89 mg/dL (ref 70–99)
Potassium: 3.6 mmol/L (ref 3.5–5.2)
Sodium: 139 mmol/L (ref 134–144)
Total Protein: 6.4 g/dL (ref 6.0–8.5)
eGFR: 90 mL/min/1.73

## 2024-03-09 LAB — LIPID PANEL
Chol/HDL Ratio: 4.2 ratio (ref 0.0–4.4)
Cholesterol, Total: 230 mg/dL — ABNORMAL HIGH (ref 100–199)
HDL: 55 mg/dL
LDL Chol Calc (NIH): 149 mg/dL — ABNORMAL HIGH (ref 0–99)
Triglycerides: 145 mg/dL (ref 0–149)
VLDL Cholesterol Cal: 26 mg/dL (ref 5–40)

## 2024-03-09 LAB — B12 AND FOLATE PANEL
Folate: >20 ng/mL
Vitamin B-12: 449 pg/mL (ref 232–1245)

## 2024-03-09 LAB — TSH: TSH: 4.26 u[IU]/mL (ref 0.450–4.500)

## 2024-03-11 ENCOUNTER — Encounter (HOSPITAL_COMMUNITY): Payer: Self-pay | Admitting: Medical

## 2024-03-12 ENCOUNTER — Encounter: Payer: Self-pay | Admitting: Physician Assistant

## 2024-03-12 ENCOUNTER — Ambulatory Visit (INDEPENDENT_AMBULATORY_CARE_PROVIDER_SITE_OTHER): Admitting: Physician Assistant

## 2024-03-12 DIAGNOSIS — S62501A Fracture of unspecified phalanx of right thumb, initial encounter for closed fracture: Secondary | ICD-10-CM | POA: Diagnosis not present

## 2024-03-12 DIAGNOSIS — S62509A Fracture of unspecified phalanx of unspecified thumb, initial encounter for closed fracture: Secondary | ICD-10-CM | POA: Insufficient documentation

## 2024-03-12 MED ORDER — HYDROCODONE-ACETAMINOPHEN 5-325 MG PO TABS: 1.0000 | ORAL_TABLET | ORAL | 0 refills | Status: DC | PRN

## 2024-03-12 NOTE — Progress Notes (Signed)
 "  Office Visit Note   Patient: Terri Ayala           Date of Birth: Mar 12, 1955           MRN: [id number] Visit Date: 03/12/2024              Requested by: Loreda Myla SAUNDERS, NP [address] [place],  [address] PCP: Gayle Saddie [place], [place]   Assessment & Plan: Visit Diagnoses:  Metacarpal  fracture thumb  Plan: 69 year old woman who is almost 3 weeks status fall onto right hand.  She is right-hand dominant.  She said she did not think much of it first but had significant pain and about a week ago was seen and evaluated in urgent care x-rays there demonstrated a fracture at the base of the thumb.  She has been in a thumb spica cast they did give her some hydrocodone  she is requesting some more.  I will give her a few more.  Fracture is displaced and overlapped.  I will arrange for her to get in and see Dr. Erwin.  In the meantime should remain in the splint  Follow-Up Instructions: With Dr. Erwin  Orders:  No orders of the defined types were placed in this encounter.  Meds ordered this encounter  Medications   HYDROcodone -acetaminophen  (NORCO/VICODIN) 5-325 MG tablet    Sig: Take 1 tablet by mouth every 4 (four) hours as needed for moderate pain (pain score 4-6).    Dispense:  10 tablet    Refill:  0      Procedures: No procedures performed   Clinical Data: No additional findings.   Subjective: No chief complaint on file.   HPI patient is a 1 right-hand-dominant woman who is at least 3 weeks status post falling onto her right hand.  She did not think much of it at first but continued to have pain at the base of her thumb and went and saw an urgent care about a week ago.  X-rays there demonstrate a displaced and overlapped fracture at the base of the thumb of the right hand.  She has been in a thumb spica cast.  Review of Systems  All other systems reviewed and are negative.    Objective: Vital Signs: There were no vitals taken for this visit.  Physical  Exam Constitutional:      Appearance: Normal appearance.  Skin:    General: Skin is warm and dry.  Neurological:     General: No focal deficit present.     Mental Status: She is alert and oriented to person, place, and time.     Ortho Exam Examination of her right hand she has no soft tissue swelling pulses intact she is acutely tender at the base of the right thumb.  He has brisk capillary refill sensation is intact Specialty Comments:  No specialty comments available.  Imaging: No results found.   PMFS History: Patient Active Problem List   Diagnosis Date Noted   Thumb fracture 03/12/2024   Contusion of toe 12/05/2023   Diarrhea, unspecified 12/05/2023   Dysphagia, unspecified 12/05/2023   Family history of malignant neoplasm of colon 12/05/2023   Preoperative clearance 10/19/2023   Malnutrition of moderate degree 07/28/2023   Alcohol  withdrawal (HCC) 07/24/2023   Elevated TSH 07/24/2023   Sensorineural hearing loss, bilateral 01/01/2023   Foreign body of right ear 01/01/2023   Alcoholic peripheral neuropathy 12/28/2022   Mild cognitive impairment 10/17/2022   Balance problem 05/23/2022  Abnormal movement 05/23/2022   Weakness of both hands 05/23/2022   Rosacea 05/17/2021   Seborrheic keratosis 05/17/2021   Melanocytic nevi of trunk 05/17/2021   Chronic pain of left ankle 05/12/2021   Body mass index 26.0-26.9, adult 05/12/2021   Thyroid  nodule 02/10/2021   Prolonged QT interval 02/10/2021   Elevated cholesterol 10/19/2020   Gastroparesis 03/25/2020   Gastroesophageal reflux disease without esophagitis 03/25/2020   Elevated LFTs 07/12/2018   Left rib fracture 01/04/2018   Leukocytosis 12/08/2017   Benign essential HTN 12/08/2017   Osteopenia 08/27/2016   Major depressive disorder, recurrent episode, moderate (HCC) 01/27/2016   Alcohol  dependence with withdrawal (HCC) 01/27/2016   Uncomplicated alcohol  dependence (HCC) 04/03/2014   Lumbosacral radiculopathy  at L5 05/20/2013   Protein-calorie malnutrition, severe 03/18/2013   Nonspecific elevation of levels of transaminase or lactic acid dehydrogenase (LDH) 03/16/2013   Hepatic steatosis 03/16/2013   Chronic cholecystitis 03/14/2013   Cervical arthritis 10/12/2012   Palpitations 11/29/2010   Supraventricular tachycardia 11/29/2010   Mitral valve prolapse 11/29/2010   Decreased libido 11/15/2010   Multinodular goiter 08/17/2009   Migraine 08/17/2009   Hearing loss 08/17/2009   Osteoporosis 08/17/2009   Generalized anxiety disorder 08/17/2009   Eosinophilic esophagitis 06/19/2007   Past Medical History:  Diagnosis Date   Alcohol  withdrawal (HCC) 11/2017; 01/04/2018   Alcoholism (HCC)    Anxiety    Arthritis    hands (01/04/2018)   ASYMPTOMATIC POSTMENOPAUSAL STATUS 08/17/2009   Chronic lower back pain    Depression    DYSPHAGIA 06/19/2007   Fatty liver, alcoholic    GERD (gastroesophageal reflux disease)    GOITER, MULTINODULAR 08/17/2009   History of colonic polyps 03/25/2020   Hyperlipidemia    Hypertension    Migraines    not sure what triggers them; I'll have 1 q couple weeks or more; come 2 days in a row when they come (01/04/2018)   Mitral valve prolapse    OSTEOPOROSIS 08/17/2009   PONV (postoperative nausea and vomiting)    Supraventricular tachycardia     Family History  Problem Relation Age of Onset   Colon cancer Father        Deceased, 9   Osteoporosis Mother        Living, 64   Healthy Brother    Healthy Brother    Healthy Son    Healthy Son    Thyroid  disease Neg Hx    Goiter Neg Hx     Past Surgical History:  Procedure Laterality Date   BREAST BIOPSY Right    benign   CATARACT EXTRACTION W/ INTRAOCULAR LENS  IMPLANT, BILATERAL     CERVICAL CONE BIOPSY  2000s   CERVIX LESION DESTRUCTION  1991   dysplasia; lesions   CHOLECYSTECTOMY N/A 03/14/2013   Procedure: LAPAROSCOPIC CHOLECYSTECTOMY WITH ATTEMPTED INTRAOPERATIVE CHOLANGIOGRAM;  Surgeon:  Vicenta DELENA Poli, MD;  Location: [facility];  Service: General;  Laterality: N/A;   HARDWARE REMOVAL Left 07/22/2021   Procedure: HARDWARE REMOVAL;  Surgeon: Celena Sharper, MD;  Location: [facility] OR;  Service: Orthopedics;  Laterality: Left;   laporoscopic abdominal surgery     for endometriosis   LEFT HEART CATH AND CORONARY ANGIOGRAPHY N/A 05/07/2018   Procedure: LEFT HEART CATH AND CORONARY ANGIOGRAPHY;  Surgeon: Claudene Victory ORN, MD;  [facility];  Service: Cardiovascular;  Laterality: N/A;   ORIF ANKLE FRACTURE Left 02/11/2021   Procedure: OPEN REDUCTION INTERNAL FIXATION (ORIF) ANKLE FRACTURE;  Surgeon: Celena Sharper, MD;  Location: [facility]  OR;  Service: Orthopedics;  Laterality: Left;   TONSILLECTOMY     Social History   Occupational History   Occupation: Hydrographic Surveyor - retired    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[employer]  Tobacco Use   Smoking status: Former    Current packs/day: 0.00    Average packs/day: 1 pack/day for 7.0 years (7.0 ttl pk-yrs)    Types: Cigarettes    Start date: 03/01/1967    Quit date: 02/28/1974    Years since quitting: 50.0    Passive exposure: Past   Smokeless tobacco: Never   Tobacco comments:    01/04/2018 smoked when I was a teenager  Vaping Use   Vaping status: Never Used  Substance and Sexual Activity   Alcohol  use: Not Currently    Alcohol /week: 28.0 standard drinks of alcohol     Types: 28 Glasses of wine per week    Comment: I had Detox on 07/13/21 and no alcohol  since then   Drug use: Never   Sexual activity: Not Currently        "

## 2024-03-13 ENCOUNTER — Ambulatory Visit (INDEPENDENT_AMBULATORY_CARE_PROVIDER_SITE_OTHER): Admitting: Orthopedic Surgery

## 2024-03-13 ENCOUNTER — Other Ambulatory Visit: Payer: Self-pay

## 2024-03-13 ENCOUNTER — Encounter (HOSPITAL_BASED_OUTPATIENT_CLINIC_OR_DEPARTMENT_OTHER): Payer: Self-pay | Admitting: Orthopedic Surgery

## 2024-03-13 ENCOUNTER — Other Ambulatory Visit (INDEPENDENT_AMBULATORY_CARE_PROVIDER_SITE_OTHER): Payer: Self-pay

## 2024-03-13 DIAGNOSIS — S62231A Other displaced fracture of base of first metacarpal bone, right hand, initial encounter for closed fracture: Secondary | ICD-10-CM | POA: Diagnosis not present

## 2024-03-13 DIAGNOSIS — M79641 Pain in right hand: Secondary | ICD-10-CM | POA: Diagnosis not present

## 2024-03-13 NOTE — Progress Notes (Signed)
 "  Terri Ayala - 69 y.o. female MRN [id number]  Date of birth: 10/05/1955  Office Visit Note: Visit Date: 03/13/2024 PCP: Gayle Saddie FALCON, PA-C Referred by: Gayle Saddie FALCON, PA-C  Subjective: No chief complaint on file.  HPI: Terri Ayala is a pleasant 69 y.o. female who presents today for evaluation of a right thumb injury sustained approximately 3 to 4 weeks prior after mechanical fall.  She was seen in the outpatient setting by one of our PAs and was sent to me for specific hand surgical evaluation given the displaced nature of her thumb metacarpal fracture.  She has been wearing a thumb spica brace, having ongoing pain.  She is right-hand dominant, is cane dependent as well  Pertinent ROS were reviewed with the patient and found to be negative unless otherwise specified above in HPI.   Visit Reason: right hand/thumb Duration of symptoms: 3-4 weeks Hand dominance: right Occupation: retired Diabetic: no Smoking: No Heart/Lung History: none Blood Thinners: none  Prior Testing/EMG: 03/04/24 Injections (Date): none Treatments: thumb brace Prior Surgery: none    Assessment & Plan: Visit Diagnoses:  1. Other closed displaced fracture of base of first metacarpal bone of right hand, initial encounter   2. Pain in right hand     Plan: Extensive discussion was had with the patient today regarding her right thumb metacarpal fracture.  Repeat x-rays were taken today which show persistent displacement of the thumb metacarpal fracture, oblique in nature near the basilar aspect of the joint.  Given the displaced nature of the injury, we did discuss the need for closed versus open reduction and internal fixation.  Given the chronicity of the injury, I explained that the likelihood for open reduction internal fixation is quite high.  This will need to be performed urgently given the length of time from the initial injury.  The benefits of this procedure would be to promote fracture healing by  providing stability and to heal the fracture in the appropriate alignment. The alternatives of this surgery would be to treat the fracture with immobilization in a splint/brace/cast or to do no intervention. The patient's questions were answered to satisfaction. After this discussion, patient elected to proceed with surgery. Informed consent was obtained.   Risks and benefits of the procedure were discussed, risks including but not limited to infection, bleeding, scarring, stiffness, nerve injury, tendon injury, vascular injury, hardware complication, malunion, nonunion, recurrence of symptoms and need for subsequent operation.  Patient expressed understanding.  Surgery will be performed urgently this week.   Follow-up: No follow-ups on file.   Meds & Orders: No orders of the defined types were placed in this encounter.   Orders Placed This Encounter  Procedures   XR Hand Complete Right     Procedures: No procedures performed      Clinical History: No specialty comments available.  She reports that she quit smoking about 50 years ago. Her smoking use included cigarettes. She started smoking about 57 years ago. She has a 7 pack-year smoking history. She has been exposed to tobacco smoke. She has never used smokeless tobacco.  Recent Labs    10/19/23 0937  HGBA1C 5.3    Objective:   Vital Signs: There were no vitals taken for this visit.  Physical Exam  Gen: Well-appearing, in no acute distress; non-toxic CV: Regular Rate. Well-perfused. Warm.  Resp: Breathing unlabored on room air; no wheezing. Psych: Fluid speech in conversation; appropriate affect; normal thought process  Ortho Exam Right thumb -  Skin is intact, notable tenderness over the thumb metacarpal basilar region, range of motion of the thumb is limited secondary to pain, sensation intact distally, hand remains warm well-perfused   Imaging: XR Hand Complete Right Result Date: 03/14/2024 X-rays of the right hand  oblique fracture extending through the base of the right first metacarpal with lateral displacement   Past Medical/Family/Surgical/Social History: Medications & Allergies reviewed per EMR, new medications updated. Patient Active Problem List   Diagnosis Date Noted   Thumb fracture 03/12/2024   Contusion of toe 12/05/2023   Diarrhea, unspecified 12/05/2023   Dysphagia, unspecified 12/05/2023   Family history of malignant neoplasm of colon 12/05/2023   Preoperative clearance 10/19/2023   Malnutrition of moderate degree 07/28/2023   Alcohol  withdrawal (HCC) 07/24/2023   Elevated TSH 07/24/2023   Sensorineural hearing loss, bilateral 01/01/2023   Foreign body of right ear 01/01/2023   Alcoholic peripheral neuropathy 12/28/2022   Mild cognitive impairment 10/17/2022   Balance problem 05/23/2022   Abnormal movement 05/23/2022   Weakness of both hands 05/23/2022   Rosacea 05/17/2021   Seborrheic keratosis 05/17/2021   Melanocytic nevi of trunk 05/17/2021   Chronic pain of left ankle 05/12/2021   Body mass index 26.0-26.9, adult 05/12/2021   Thyroid  nodule 02/10/2021   Prolonged QT interval 02/10/2021   Elevated cholesterol 10/19/2020   Gastroparesis 03/25/2020   Gastroesophageal reflux disease without esophagitis 03/25/2020   Elevated LFTs 07/12/2018   Left rib fracture 01/04/2018   Leukocytosis 12/08/2017   Benign essential HTN 12/08/2017   Osteopenia 08/27/2016   Major depressive disorder, recurrent episode, moderate (HCC) 01/27/2016   Alcohol  dependence with withdrawal (HCC) 01/27/2016   Uncomplicated alcohol  dependence (HCC) 04/03/2014   Lumbosacral radiculopathy at L5 05/20/2013   Protein-calorie malnutrition, severe 03/18/2013   Nonspecific elevation of levels of transaminase or lactic acid dehydrogenase (LDH) 03/16/2013   Hepatic steatosis 03/16/2013   Chronic cholecystitis 03/14/2013   Cervical arthritis 10/12/2012   Palpitations 11/29/2010   Supraventricular  tachycardia 11/29/2010   Mitral valve prolapse 11/29/2010   Decreased libido 11/15/2010   Multinodular goiter 08/17/2009   Migraine 08/17/2009   Hearing loss 08/17/2009   Osteoporosis 08/17/2009   Generalized anxiety disorder 08/17/2009   Eosinophilic esophagitis 06/19/2007   Past Medical History:  Diagnosis Date   Alcohol  withdrawal (HCC) 11/2017; 01/04/2018   Alcoholism (HCC)    Anxiety    Arthritis    hands (01/04/2018)   ASYMPTOMATIC POSTMENOPAUSAL STATUS 08/17/2009   Chronic lower back pain    Depression    DYSPHAGIA 06/19/2007   Fatty liver, alcoholic    GERD (gastroesophageal reflux disease)    GOITER, MULTINODULAR 08/17/2009   History of colonic polyps 03/25/2020   Hyperlipidemia    Hypertension    Migraines    not sure what triggers them; I'll have 1 q couple weeks or more; come 2 days in a row when they come (01/04/2018)   Mitral valve prolapse    OSTEOPOROSIS 08/17/2009   PONV (postoperative nausea and vomiting)    Supraventricular tachycardia    Family History  Problem Relation Age of Onset   Colon cancer Father        Deceased, 71   Osteoporosis Mother        Living, 23   Healthy Brother    Healthy Brother    Healthy Son    Healthy Son    Thyroid  disease Neg Hx    Goiter Neg Hx    Past Surgical History:  Procedure Laterality Date  BREAST BIOPSY Right    benign   CATARACT EXTRACTION W/ INTRAOCULAR LENS  IMPLANT, BILATERAL     CERVICAL CONE BIOPSY  2000s   CERVIX LESION DESTRUCTION  1991   dysplasia; lesions   CHOLECYSTECTOMY N/A 03/14/2013   Procedure: LAPAROSCOPIC CHOLECYSTECTOMY WITH ATTEMPTED INTRAOPERATIVE CHOLANGIOGRAM;  Surgeon: Vicenta DELENA Poli, MD;  Location: [facility];  Service: General;  Laterality: N/A;   HARDWARE REMOVAL Left 07/22/2021   Procedure: HARDWARE REMOVAL;  Surgeon: Celena Sharper, MD;  Location: [facility] OR;  Service: Orthopedics;  Laterality: Left;   laporoscopic abdominal surgery     for endometriosis   LEFT HEART CATH  AND CORONARY ANGIOGRAPHY N/A 05/07/2018   Procedure: LEFT HEART CATH AND CORONARY ANGIOGRAPHY;  Surgeon: Claudene Victory ORN, MD;  Location: [facility];  Service: Cardiovascular;  Laterality: N/A;   ORIF ANKLE FRACTURE Left 02/11/2021   Procedure: OPEN REDUCTION INTERNAL FIXATION (ORIF) ANKLE FRACTURE;  Surgeon: Celena Sharper, MD;  Location: [facility] OR;  Service: Orthopedics;  Laterality: Left;   TONSILLECTOMY     Social History   Occupational History   Occupation: Hydrographic Surveyor - retired    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[employer]  Tobacco Use   Smoking status: Former    Current packs/day: 0.00    Average packs/day: 1 pack/day for 7.0 years (7.0 ttl pk-yrs)    Types: Cigarettes    Start date: 03/01/1967    Quit date: 02/28/1974    Years since quitting: 50.0    Passive exposure: Past   Smokeless tobacco: Never   Tobacco comments:    01/04/2018 smoked when I was a teenager  Vaping Use   Vaping status: Never Used  Substance and Sexual Activity   Alcohol  use: Not Currently    Alcohol /week: 28.0 standard drinks of alcohol     Types: 28 Glasses of wine per week    Comment: I had Detox on 07/13/21 and no alcohol  since then   Drug use: Never   Sexual activity: Not Currently    Enzley Kitchens Estela) Arlinda, M.D. [facility], [facility]  "

## 2024-03-13 NOTE — H&P (View-Only) (Signed)
 "  Terri Ayala - 69 y.o. female MRN [id number]  Date of birth: 10/05/1955  Office Visit Note: Visit Date: 03/13/2024 PCP: Gayle Saddie FALCON, PA-C Referred by: Gayle Saddie FALCON, PA-C  Subjective: No chief complaint on file.  HPI: Terri Ayala is a pleasant 69 y.o. female who presents today for evaluation of a right thumb injury sustained approximately 3 to 4 weeks prior after mechanical fall.  She was seen in the outpatient setting by one of our PAs and was sent to me for specific hand surgical evaluation given the displaced nature of her thumb metacarpal fracture.  She has been wearing a thumb spica brace, having ongoing pain.  She is right-hand dominant, is cane dependent as well  Pertinent ROS were reviewed with the patient and found to be negative unless otherwise specified above in HPI.   Visit Reason: right hand/thumb Duration of symptoms: 3-4 weeks Hand dominance: right Occupation: retired Diabetic: no Smoking: No Heart/Lung History: none Blood Thinners: none  Prior Testing/EMG: 03/04/24 Injections (Date): none Treatments: thumb brace Prior Surgery: none    Assessment & Plan: Visit Diagnoses:  1. Other closed displaced fracture of base of first metacarpal bone of right hand, initial encounter   2. Pain in right hand     Plan: Extensive discussion was had with the patient today regarding her right thumb metacarpal fracture.  Repeat x-rays were taken today which show persistent displacement of the thumb metacarpal fracture, oblique in nature near the basilar aspect of the joint.  Given the displaced nature of the injury, we did discuss the need for closed versus open reduction and internal fixation.  Given the chronicity of the injury, I explained that the likelihood for open reduction internal fixation is quite high.  This will need to be performed urgently given the length of time from the initial injury.  The benefits of this procedure would be to promote fracture healing by  providing stability and to heal the fracture in the appropriate alignment. The alternatives of this surgery would be to treat the fracture with immobilization in a splint/brace/cast or to do no intervention. The patient's questions were answered to satisfaction. After this discussion, patient elected to proceed with surgery. Informed consent was obtained.   Risks and benefits of the procedure were discussed, risks including but not limited to infection, bleeding, scarring, stiffness, nerve injury, tendon injury, vascular injury, hardware complication, malunion, nonunion, recurrence of symptoms and need for subsequent operation.  Patient expressed understanding.  Surgery will be performed urgently this week.   Follow-up: No follow-ups on file.   Meds & Orders: No orders of the defined types were placed in this encounter.   Orders Placed This Encounter  Procedures   XR Hand Complete Right     Procedures: No procedures performed      Clinical History: No specialty comments available.  She reports that she quit smoking about 50 years ago. Her smoking use included cigarettes. She started smoking about 57 years ago. She has a 7 pack-year smoking history. She has been exposed to tobacco smoke. She has never used smokeless tobacco.  Recent Labs    10/19/23 0937  HGBA1C 5.3    Objective:   Vital Signs: There were no vitals taken for this visit.  Physical Exam  Gen: Well-appearing, in no acute distress; non-toxic CV: Regular Rate. Well-perfused. Warm.  Resp: Breathing unlabored on room air; no wheezing. Psych: Fluid speech in conversation; appropriate affect; normal thought process  Ortho Exam Right thumb -  Skin is intact, notable tenderness over the thumb metacarpal basilar region, range of motion of the thumb is limited secondary to pain, sensation intact distally, hand remains warm well-perfused   Imaging: XR Hand Complete Right Result Date: 03/14/2024 X-rays of the right hand  oblique fracture extending through the base of the right first metacarpal with lateral displacement   Past Medical/Family/Surgical/Social History: Medications & Allergies reviewed per EMR, new medications updated. Patient Active Problem List   Diagnosis Date Noted   Thumb fracture 03/12/2024   Contusion of toe 12/05/2023   Diarrhea, unspecified 12/05/2023   Dysphagia, unspecified 12/05/2023   Family history of malignant neoplasm of colon 12/05/2023   Preoperative clearance 10/19/2023   Malnutrition of moderate degree 07/28/2023   Alcohol  withdrawal (HCC) 07/24/2023   Elevated TSH 07/24/2023   Sensorineural hearing loss, bilateral 01/01/2023   Foreign body of right ear 01/01/2023   Alcoholic peripheral neuropathy 12/28/2022   Mild cognitive impairment 10/17/2022   Balance problem 05/23/2022   Abnormal movement 05/23/2022   Weakness of both hands 05/23/2022   Rosacea 05/17/2021   Seborrheic keratosis 05/17/2021   Melanocytic nevi of trunk 05/17/2021   Chronic pain of left ankle 05/12/2021   Body mass index 26.0-26.9, adult 05/12/2021   Thyroid  nodule 02/10/2021   Prolonged QT interval 02/10/2021   Elevated cholesterol 10/19/2020   Gastroparesis 03/25/2020   Gastroesophageal reflux disease without esophagitis 03/25/2020   Elevated LFTs 07/12/2018   Left rib fracture 01/04/2018   Leukocytosis 12/08/2017   Benign essential HTN 12/08/2017   Osteopenia 08/27/2016   Major depressive disorder, recurrent episode, moderate (HCC) 01/27/2016   Alcohol  dependence with withdrawal (HCC) 01/27/2016   Uncomplicated alcohol  dependence (HCC) 04/03/2014   Lumbosacral radiculopathy at L5 05/20/2013   Protein-calorie malnutrition, severe 03/18/2013   Nonspecific elevation of levels of transaminase or lactic acid dehydrogenase (LDH) 03/16/2013   Hepatic steatosis 03/16/2013   Chronic cholecystitis 03/14/2013   Cervical arthritis 10/12/2012   Palpitations 11/29/2010   Supraventricular  tachycardia 11/29/2010   Mitral valve prolapse 11/29/2010   Decreased libido 11/15/2010   Multinodular goiter 08/17/2009   Migraine 08/17/2009   Hearing loss 08/17/2009   Osteoporosis 08/17/2009   Generalized anxiety disorder 08/17/2009   Eosinophilic esophagitis 06/19/2007   Past Medical History:  Diagnosis Date   Alcohol  withdrawal (HCC) 11/2017; 01/04/2018   Alcoholism (HCC)    Anxiety    Arthritis    hands (01/04/2018)   ASYMPTOMATIC POSTMENOPAUSAL STATUS 08/17/2009   Chronic lower back pain    Depression    DYSPHAGIA 06/19/2007   Fatty liver, alcoholic    GERD (gastroesophageal reflux disease)    GOITER, MULTINODULAR 08/17/2009   History of colonic polyps 03/25/2020   Hyperlipidemia    Hypertension    Migraines    not sure what triggers them; I'll have 1 q couple weeks or more; come 2 days in a row when they come (01/04/2018)   Mitral valve prolapse    OSTEOPOROSIS 08/17/2009   PONV (postoperative nausea and vomiting)    Supraventricular tachycardia    Family History  Problem Relation Age of Onset   Colon cancer Father        Deceased, 71   Osteoporosis Mother        Living, 23   Healthy Brother    Healthy Brother    Healthy Son    Healthy Son    Thyroid  disease Neg Hx    Goiter Neg Hx    Past Surgical History:  Procedure Laterality Date  BREAST BIOPSY Right    benign   CATARACT EXTRACTION W/ INTRAOCULAR LENS  IMPLANT, BILATERAL     CERVICAL CONE BIOPSY  2000s   CERVIX LESION DESTRUCTION  1991   dysplasia; lesions   CHOLECYSTECTOMY N/A 03/14/2013   Procedure: LAPAROSCOPIC CHOLECYSTECTOMY WITH ATTEMPTED INTRAOPERATIVE CHOLANGIOGRAM;  Surgeon: Vicenta DELENA Poli, MD;  Location: [facility];  Service: General;  Laterality: N/A;   HARDWARE REMOVAL Left 07/22/2021   Procedure: HARDWARE REMOVAL;  Surgeon: Celena Sharper, MD;  Location: [facility];  Service: Orthopedics;  Laterality: Left;   laporoscopic abdominal surgery     for endometriosis   LEFT HEART CATH  AND CORONARY ANGIOGRAPHY N/A 05/07/2018   Procedure: LEFT HEART CATH AND CORONARY ANGIOGRAPHY;  Surgeon: Claudene Victory ORN, MD;  Location: [facility];  Service: Cardiovascular;  Laterality: N/A;   ORIF ANKLE FRACTURE Left 02/11/2021   Procedure: OPEN REDUCTION INTERNAL FIXATION (ORIF) ANKLE FRACTURE;  Surgeon: Celena Sharper, MD;  Location: [facility];  Service: Orthopedics;  Laterality: Left;   TONSILLECTOMY     Social History   Occupational History   Occupation: Hydrographic Surveyor - retired    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[employer]  Tobacco Use   Smoking status: Former    Current packs/day: 0.00    Average packs/day: 1 pack/day for 7.0 years (7.0 ttl pk-yrs)    Types: Cigarettes    Start date: 03/01/1967    Quit date: 02/28/1974    Years since quitting: 50.0    Passive exposure: Past   Smokeless tobacco: Never   Tobacco comments:    01/04/2018 smoked when I was a teenager  Vaping Use   Vaping status: Never Used  Substance and Sexual Activity   Alcohol  use: Not Currently    Alcohol /week: 28.0 standard drinks of alcohol     Types: 28 Glasses of wine per week    Comment: I had Detox on 07/13/21 and no alcohol  since then   Drug use: Never   Sexual activity: Not Currently    Enzley Kitchens Estela) Arlinda, M.D. [facility], [facility]  "

## 2024-03-15 ENCOUNTER — Encounter (HOSPITAL_BASED_OUTPATIENT_CLINIC_OR_DEPARTMENT_OTHER): Admission: RE | Disposition: A | Payer: Self-pay | Source: Home / Self Care | Attending: Orthopedic Surgery

## 2024-03-15 ENCOUNTER — Encounter (HOSPITAL_BASED_OUTPATIENT_CLINIC_OR_DEPARTMENT_OTHER): Payer: Self-pay | Admitting: Orthopedic Surgery

## 2024-03-15 ENCOUNTER — Ambulatory Visit (HOSPITAL_BASED_OUTPATIENT_CLINIC_OR_DEPARTMENT_OTHER): Admitting: Anesthesiology

## 2024-03-15 ENCOUNTER — Ambulatory Visit (HOSPITAL_BASED_OUTPATIENT_CLINIC_OR_DEPARTMENT_OTHER)
Admission: RE | Admit: 2024-03-15 | Discharge: 2024-03-15 | Disposition: A | Discharge status: Home / Self Care | Attending: Orthopedic Surgery | Admitting: Orthopedic Surgery

## 2024-03-15 ENCOUNTER — Other Ambulatory Visit: Payer: Self-pay

## 2024-03-15 ENCOUNTER — Ambulatory Visit (HOSPITAL_BASED_OUTPATIENT_CLINIC_OR_DEPARTMENT_OTHER)

## 2024-03-15 DIAGNOSIS — E049 Nontoxic goiter, unspecified: Secondary | ICD-10-CM | POA: Diagnosis not present

## 2024-03-15 DIAGNOSIS — Z87891 Personal history of nicotine dependence: Secondary | ICD-10-CM

## 2024-03-15 DIAGNOSIS — M1990 Unspecified osteoarthritis, unspecified site: Secondary | ICD-10-CM | POA: Diagnosis not present

## 2024-03-15 DIAGNOSIS — F418 Other specified anxiety disorders: Secondary | ICD-10-CM

## 2024-03-15 DIAGNOSIS — Z01818 Encounter for other preprocedural examination: Secondary | ICD-10-CM

## 2024-03-15 DIAGNOSIS — I10 Essential (primary) hypertension: Secondary | ICD-10-CM | POA: Diagnosis not present

## 2024-03-15 DIAGNOSIS — S62241D Displaced fracture of shaft of first metacarpal bone, right hand, subsequent encounter for fracture with routine healing: Secondary | ICD-10-CM

## 2024-03-15 DIAGNOSIS — W19XXXA Unspecified fall, initial encounter: Secondary | ICD-10-CM | POA: Diagnosis not present

## 2024-03-15 DIAGNOSIS — K219 Gastro-esophageal reflux disease without esophagitis: Secondary | ICD-10-CM | POA: Diagnosis not present

## 2024-03-15 DIAGNOSIS — M5416 Radiculopathy, lumbar region: Secondary | ICD-10-CM | POA: Diagnosis not present

## 2024-03-15 DIAGNOSIS — S62231A Other displaced fracture of base of first metacarpal bone, right hand, initial encounter for closed fracture: Secondary | ICD-10-CM | POA: Diagnosis present

## 2024-03-15 DIAGNOSIS — F331 Major depressive disorder, recurrent, moderate: Secondary | ICD-10-CM | POA: Insufficient documentation

## 2024-03-15 DIAGNOSIS — I471 Supraventricular tachycardia, unspecified: Secondary | ICD-10-CM | POA: Diagnosis not present

## 2024-03-15 DIAGNOSIS — S62241A Displaced fracture of shaft of first metacarpal bone, right hand, initial encounter for closed fracture: Secondary | ICD-10-CM | POA: Insufficient documentation

## 2024-03-15 HISTORY — PX: CLOSED REDUCTION METACARPAL WITH PERCUTANEOUS PINNING: SHX5613

## 2024-03-15 MED ORDER — LACTATED RINGERS IV SOLN: INTRAVENOUS | Status: DC

## 2024-03-15 MED ORDER — AMISULPRIDE (ANTIEMETIC) 5 MG/2ML IV SOLN: 5.0000 mg | Freq: Once | INTRAVENOUS | Status: DC | PRN

## 2024-03-15 MED ORDER — FENTANYL CITRATE (PF) 100 MCG/2ML IJ SOLN
INTRAMUSCULAR | Status: AC
  Filled 2024-03-15: qty 2

## 2024-03-15 MED ORDER — OXYCODONE HCL 5 MG PO TABS
ORAL_TABLET | ORAL | Status: AC
  Filled 2024-03-15: qty 1

## 2024-03-15 MED ORDER — HYDROMORPHONE HCL 1 MG/ML IJ SOLN
INTRAMUSCULAR | Status: AC
  Filled 2024-03-15: qty 0.5

## 2024-03-15 MED ORDER — CEFAZOLIN SODIUM-DEXTROSE 2-4 GM/100ML-% IV SOLN
2.0000 g | INTRAVENOUS | Status: AC
  Administered 2024-03-15: 50 g via INTRAVENOUS
  Administered 2024-03-15: 100 g via INTRAVENOUS
  Administered 2024-03-15: 2 g via INTRAVENOUS

## 2024-03-15 MED ORDER — ONDANSETRON HCL 4 MG/2ML IJ SOLN
INTRAMUSCULAR | Status: DC | PRN
  Administered 2024-03-15: 4 mg via INTRAVENOUS

## 2024-03-15 MED ORDER — HYDROMORPHONE HCL 1 MG/ML IJ SOLN
0.2500 mg | INTRAMUSCULAR | Status: DC | PRN
  Administered 2024-03-15 (×2): 0.5 mg via INTRAVENOUS

## 2024-03-15 MED ORDER — DEXAMETHASONE SOD PHOSPHATE PF 10 MG/ML IJ SOLN
INTRAMUSCULAR | Status: DC | PRN
  Administered 2024-03-15: 5 mg via INTRAVENOUS

## 2024-03-15 MED ORDER — LIDOCAINE HCL 1 % IJ SOLN
INTRAMUSCULAR | Status: DC | PRN
  Administered 2024-03-15: 10 mL

## 2024-03-15 MED ORDER — PHENYLEPHRINE HCL-NACL 20-0.9 MG/250ML-% IV SOLN: INTRAVENOUS | Status: DC | PRN

## 2024-03-15 MED ORDER — CEFAZOLIN SODIUM-DEXTROSE 2-4 GM/100ML-% IV SOLN
INTRAVENOUS | Status: AC
  Filled 2024-03-15: qty 100

## 2024-03-15 MED ORDER — FENTANYL CITRATE (PF) 100 MCG/2ML IJ SOLN
INTRAMUSCULAR | Status: DC | PRN
  Administered 2024-03-15 (×2): 50 ug via INTRAVENOUS

## 2024-03-15 MED ORDER — MIDAZOLAM HCL 2 MG/2ML IJ SOLN
INTRAMUSCULAR | Status: AC
  Filled 2024-03-15: qty 2

## 2024-03-15 MED ORDER — OXYCODONE HCL 5 MG/5ML PO SOLN: 5.0000 mg | Freq: Once | ORAL | Status: AC | PRN

## 2024-03-15 MED ORDER — PHENYLEPHRINE HCL-NACL 20-0.9 MG/250ML-% IV SOLN
INTRAVENOUS | Status: DC | PRN
  Administered 2024-03-15: 50 ug/min via INTRAVENOUS

## 2024-03-15 MED ORDER — KETOROLAC TROMETHAMINE 30 MG/ML IJ SOLN: 15.0000 mg | Freq: Once | INTRAMUSCULAR | Status: DC | PRN

## 2024-03-15 MED ORDER — OXYCODONE HCL 5 MG PO TABS
5.0000 mg | ORAL_TABLET | Freq: Once | ORAL | Status: AC | PRN
  Administered 2024-03-15: 5 mg via ORAL

## 2024-03-15 MED ORDER — PHENYLEPHRINE 80 MCG/ML (10ML) SYRINGE FOR IV PUSH (FOR BLOOD PRESSURE SUPPORT)
PREFILLED_SYRINGE | INTRAVENOUS | Status: DC | PRN
  Administered 2024-03-15 (×2): 160 ug via INTRAVENOUS

## 2024-03-15 MED ORDER — LIDOCAINE 2% (20 MG/ML) 5 ML SYRINGE
INTRAMUSCULAR | Status: DC | PRN
  Administered 2024-03-15: 60 mg via INTRAVENOUS

## 2024-03-15 MED ORDER — MIDAZOLAM HCL (PF) 2 MG/2ML IJ SOLN
INTRAMUSCULAR | Status: DC | PRN
  Administered 2024-03-15 (×2): 1 mg via INTRAVENOUS

## 2024-03-15 MED ORDER — ONDANSETRON HCL 4 MG/2ML IJ SOLN: INTRAMUSCULAR | Status: DC | PRN

## 2024-03-15 MED ORDER — OXYCODONE HCL 5 MG PO TABS: 5.0000 mg | ORAL_TABLET | Freq: Four times a day (QID) | ORAL | 0 refills | Status: DC | PRN

## 2024-03-15 MED ORDER — PROPOFOL 10 MG/ML IV BOLUS
INTRAVENOUS | Status: DC | PRN
  Administered 2024-03-15: 30 mg via INTRAVENOUS
  Administered 2024-03-15: 100 mg via INTRAVENOUS

## 2024-03-15 NOTE — Interval H&P Note (Signed)
 History and Physical Interval Note:  03/15/2024 1:25 PM  Terri Ayala  has presented today for surgery, with the diagnosis of RIGHT THUMB METACARPAL FRACTURE.  The various methods of treatment have been discussed with the patient and family. After consideration of risks, benefits and other options for treatment, the patient has consented to  Procedures with comments: CLOSED REDUCTION, FRACTURE, METACARPAL BONE, WITH PERCUTANEOUS PINNING (Right) - RIGHT THUMB CLOSED VS OPEN REDUCTION INTERNAL FIXATION METACARPAL FRACTURE OPEN REDUCTION INTERNAL FIXATION (ORIF) METACARPAL (Right) as a surgical intervention.  The patient's history has been reviewed, patient examined, no change in status, stable for surgery.  I have reviewed the patient's chart and labs.  Questions were answered to the patient's satisfaction.     [name]

## 2024-03-15 NOTE — Op Note (Signed)
 NAME: Terri Ayala MEDICAL RECORD NO: [id number] DATE OF BIRTH: 06-11-1955 FACILITY: [facility] LOCATION: [facility] PHYSICIAN: GILDARDO ALDERTON, MD   OPERATIVE REPORT   DATE OF PROCEDURE: 03/15/24    PREOPERATIVE DIAGNOSIS: Right thumb metacarpal shaft fracture   POSTOPERATIVE DIAGNOSIS: Right thumb metacarpal shaft fracture   PROCEDURE: Right thumb metacarpal shaft fracture closed reduction and percutaneous pinning   SURGEON:  Gildardo Alderton, M.D.   ASSISTANT: Joesph Dinsmore, OPA   ANESTHESIA:  General   INTRAVENOUS FLUIDS:  Per anesthesia flow sheet.   ESTIMATED BLOOD LOSS:  Minimal.   COMPLICATIONS:  None.   SPECIMENS:  none   TOURNIQUET TIME: Not utilized    DISPOSITION:  Stable to PACU.   INDICATIONS: 69 year old female who had sustained a right thumb metacarpal shaft fracture, was seen in the outpatient setting and found to have a displaced metacarpal shaft fracture of the right thumb requiring closed versus open reduction and internal fixation.  We discussed the possibility of closed reduction and percutaneous pinning versus open reduction internal fixation.  Given the chronicity of the injury, we did discuss that the possibility of open reduction internal fixation may be required.  Risks and benefits of surgery were discussed including the risks of infection, bleeding, scarring, stiffness, nerve injury, vascular injury, tendon injury, need for subsequent operation, malunion, nonunion, hardware failure.  She voiced understanding of these risks and elected to proceed.  OPERATIVE COURSE: Patient was seen and identified in the preoperative area and marked appropriately.  Surgical consent had been signed. Preoperative IV antibiotic prophylaxis was given. She was transferred to the operating room and placed in supine position with the Right upper extremity on an arm board.  General anesthesia was induced by the anesthesiologist.  Right upper extremity was prepped  and draped in normal sterile orthopedic fashion.  A surgical pause was performed between the surgeons, anesthesia, and operating room staff and all were in agreement as to the patient, procedure, and site of procedure.  Tourniquet was placed and padded appropriately to the right upper arm however was not utilized for this case.  We first began with closed reduction maneuver of the right thumb metacarpal fracture.  Dental pick was utilized to create mobility at the fracture site.  With gentle traction and reduction maneuver, we were able to mobilize the fracture segment.  Closed reduction maneuver was performed under biplanar fluoroscopy to an acceptable parameter for the thumb metacarpal fracture reduction.  At this juncture, 0.062 K wire was then introduced in antegrade fashion across the fracture site in bicortical fashion, confirmed with biplanar fluoroscopy to maintain the fracture reduction.  Additional 0.054 K wire was introduced also in antegrade fashion across the fracture site, also in bicortical fashion for added stability.  Wire placement and fracture reduction once again confirmed with final biplanar fluoroscopic films.  Wires were then bent and cut appropriately.  Pin caps were applied.  Sterile dressings were applied followed by application of a thumb spica splint utilizing plaster.  The operative drapes were broken down.  The patient was awoken from anesthesia safely and taken to PACU in stable condition.   Post-operative plan: The patient will recover in the post-anesthesia care unit and then be discharged home.  The patient will be non weight bearing on the right upper extremity in a spica splint.   I will see the patient back in the office in 2 weeks for postoperative followup.    Brea Coleson, MD Electronically signed, 03/15/24

## 2024-03-15 NOTE — Transfer of Care (Signed)
 Immediate Anesthesia Transfer of Care Note  Patient: Terri Ayala  Procedure(s) Performed: Procedures (LRB): CLOSED REDUCTION, FRACTURE, METACARPAL BONE, WITH PERCUTANEOUS PINNING (Right)  Patient Location: PACU  Anesthesia Type: General  Level of Consciousness: awake, oriented, sedated and patient cooperative  Airway & Oxygen Therapy: Patient Spontanous Breathing and Patient connected to face mask oxygen  Post-op Assessment: Report given to PACU RN and Post -op Vital signs reviewed and stable  Post vital signs: Reviewed and stable  Complications: No apparent anesthesia complications Last Vitals:  Vitals Value Taken Time  BP 152/74 03/15/24 15:23  Temp    Pulse 88 03/15/24 15:24  Resp 14 03/15/24 15:24  SpO2 100 % 03/15/24 15:24  Vitals shown include unfiled device data.  Last Pain:  Vitals:   03/15/24 1259  TempSrc: Temporal  PainSc: 8          Complications: No notable events documented.

## 2024-03-15 NOTE — Anesthesia Procedure Notes (Signed)
 Procedure Name: LMA Insertion Date/Time: 03/15/2024 2:36 PM  Performed by: Delayne Olam BIRCH, CRNAPre-anesthesia Checklist: Patient identified, Emergency Drugs available, Suction available and Patient being monitored Patient Re-evaluated:Patient Re-evaluated prior to induction Oxygen Delivery Method: Circle system utilized Preoxygenation: Pre-oxygenation with 100% oxygen Induction Type: IV induction Ventilation: Mask ventilation without difficulty LMA: LMA inserted LMA Size: 4.0 Number of attempts: 1 Airway Equipment and Method: Bite block Placement Confirmation: positive ETCO2 Tube secured with: Tape Dental Injury: Teeth and Oropharynx as per pre-operative assessment

## 2024-03-15 NOTE — Anesthesia Preprocedure Evaluation (Addendum)
 "                                  Anesthesia Evaluation  Patient identified by MRN, date of birth, ID band Patient awake    Reviewed: Allergy & Precautions, NPO status , Patient's Chart, lab work & pertinent test results, reviewed documented beta blocker date and time   History of Anesthesia Complications (+) PONV and history of anesthetic complications  Airway Mallampati: II  TM Distance: >3 FB Neck ROM: Full    Dental  (+) Dental Advisory Given, Teeth Intact   Pulmonary neg recent URI, former smoker   Pulmonary exam normal breath sounds clear to auscultation       Cardiovascular hypertension, Pt. on medications and Pt. on home beta blockers + dysrhythmias Supra Ventricular Tachycardia  Rhythm:Regular Rate:Normal  Echo 06/2023  1. Left ventricular ejection fraction, by estimation, is 70 to 75%. The  left ventricle has hyperdynamic function. The left ventricle has no  regional wall motion abnormalities. Indeterminate diastolic filling due to  E-A fusion.   2. Right ventricular systolic function is normal. The right ventricular  size is normal. There is normal pulmonary artery systolic pressure. The  estimated right ventricular systolic pressure is 19.0 mmHg.   3. The mitral valve is grossly normal. Trivial mitral valve  regurgitation. No evidence of mitral stenosis.   4. The aortic valve is tricuspid. Aortic valve regurgitation is not  visualized. No aortic stenosis is present.   5. The inferior vena cava is normal in size with greater than 50%  respiratory variability, suggesting right atrial pressure of 3 mmHg.   Conclusion(s)/Recommendation(s): Normal biventricular function without  evidence of hemodynamically significant valvular heart disease.      Neuro/Psych  Headaches PSYCHIATRIC DISORDERS Anxiety Depression     Neuromuscular disease ( lumbar radiculopathy )  negative psych ROS   GI/Hepatic ,GERD  Medicated,,(+)     substance abuse  alcohol   useCompleted inpatient ETOH detox on Monday 07/19/21, has plans for outpatient treatment after surgery. Patient states she has not had any alcohol  since being discharged on Monday.    Endo/Other  goiter  Renal/GU negative Renal ROS     Musculoskeletal  (+) Arthritis , Osteoarthritis,    Abdominal   Peds  Hematology negative hematology ROS (+)   Anesthesia Other Findings   Reproductive/Obstetrics                              Anesthesia Physical Anesthesia Plan  ASA: 3  Anesthesia Plan: General   Post-op Pain Management: Tylenol  PO (pre-op)*, Gabapentin  PO (pre-op)*, Toradol  IV (intra-op)* and Precedex    Induction: Intravenous  PONV Risk Score and Plan: 4 or greater and Midazolam , Scopolamine  patch - Pre-op, Treatment may vary due to age or medical condition, Ondansetron  and Dexamethasone   Airway Management Planned:   Additional Equipment: None  Intra-op Plan:   Post-operative Plan: Extubation in OR  Informed Consent: I have reviewed the patients History and Physical, chart, labs and discussed the procedure including the risks, benefits and alternatives for the proposed anesthesia with the patient or authorized representative who has indicated his/her understanding and acceptance.     Dental advisory given  Plan Discussed with: CRNA  Anesthesia Plan Comments: (Discussed anesthetic plan with Dr. Agarwala. He is concerned about her falling and her use of a cain with peripheral nerve block. Also, her  alcohol  abuse history is concerning as well for post op mobility and falls. He will localize after surgery. I discussed potential post op block if pain were uncontrollable.  Risks of anesthesia explained at length. This includes, but is not limited to, sore throat, damage to teeth, lips gums, tongue and vocal cords, nausea and vomiting, reactions to medications, stroke, heart attack, and death. All patient questions were answered and the patient  wishes to proceed. Risks of peripheral nerve block explained at length. This includes, but is not limited to, bleeding, infection, reactions to the medications, seizures, damage to surrounding structures, damage to nerves, permanent weakness, numbness, tingling and pain. All patient questions were answered and patient wishes to proceed with nerve block.      PAT note written 07/21/2021 by Allison Zelenak, PA-C. Completed inpatient alcohol  detox 07/17/21.)         Anesthesia Quick Evaluation  "

## 2024-03-15 NOTE — Discharge Instructions (Signed)

## 2024-03-16 NOTE — Anesthesia Postprocedure Evaluation (Signed)
"   Anesthesia Post Note  Patient: Terri Ayala  Procedure(s) Performed: CLOSED REDUCTION, FRACTURE, METACARPAL BONE, WITH PERCUTANEOUS PINNING (Right: Finger)     Patient location during evaluation: PACU Anesthesia Type: General Level of consciousness: sedated and patient cooperative Pain management: pain level controlled Vital Signs Assessment: post-procedure vital signs reviewed and stable Respiratory status: spontaneous breathing Cardiovascular status: stable Anesthetic complications: no   No notable events documented.  Last Vitals:  Vitals:   03/15/24 1600 03/15/24 1614  BP: 136/76 (!) 151/85  Pulse: 91 90  Resp: 13 15  Temp:  36.4 C  SpO2: 95% 96%    Last Pain:  Vitals:   03/15/24 1614  TempSrc:   PainSc: 5                  [name]      "

## 2024-03-17 ENCOUNTER — Encounter (HOSPITAL_BASED_OUTPATIENT_CLINIC_OR_DEPARTMENT_OTHER): Payer: Self-pay | Admitting: Orthopedic Surgery

## 2024-03-19 ENCOUNTER — Inpatient Hospital Stay (HOSPITAL_COMMUNITY)
Admission: EM | Admit: 2024-03-19 | Discharge: 2024-03-22 | DRG: 543 | Disposition: A | Discharge status: Home / Self Care | Attending: Internal Medicine | Admitting: Internal Medicine

## 2024-03-19 ENCOUNTER — Other Ambulatory Visit: Payer: Self-pay

## 2024-03-19 ENCOUNTER — Emergency Department (HOSPITAL_COMMUNITY)

## 2024-03-19 ENCOUNTER — Encounter (HOSPITAL_COMMUNITY): Payer: Self-pay | Admitting: Internal Medicine

## 2024-03-19 DIAGNOSIS — W19XXXA Unspecified fall, initial encounter: Principal | ICD-10-CM

## 2024-03-19 DIAGNOSIS — H9190 Unspecified hearing loss, unspecified ear: Secondary | ICD-10-CM

## 2024-03-19 DIAGNOSIS — S22000A Wedge compression fracture of unspecified thoracic vertebra, initial encounter for closed fracture: Secondary | ICD-10-CM | POA: Diagnosis present

## 2024-03-19 DIAGNOSIS — F1020 Alcohol dependence, uncomplicated: Secondary | ICD-10-CM | POA: Diagnosis present

## 2024-03-19 DIAGNOSIS — F331 Major depressive disorder, recurrent, moderate: Secondary | ICD-10-CM | POA: Diagnosis present

## 2024-03-19 DIAGNOSIS — S22080A Wedge compression fracture of T11-T12 vertebra, initial encounter for closed fracture: Secondary | ICD-10-CM

## 2024-03-19 DIAGNOSIS — I10 Essential (primary) hypertension: Secondary | ICD-10-CM | POA: Diagnosis present

## 2024-03-19 LAB — I-STAT CHEM 8, ED
BUN: 14 mg/dL (ref 8–23)
Calcium, Ion: 1.11 mmol/L — ABNORMAL LOW (ref 1.15–1.40)
Chloride: 103 mmol/L (ref 98–111)
Creatinine, Ser: 0.9 mg/dL (ref 0.44–1.00)
Glucose, Bld: 74 mg/dL (ref 70–99)
HCT: 38 % (ref 36.0–46.0)
Hemoglobin: 12.9 g/dL (ref 12.0–15.0)
Potassium: 4.1 mmol/L (ref 3.5–5.1)
Sodium: 142 mmol/L (ref 135–145)
TCO2: 25 mmol/L (ref 22–32)

## 2024-03-19 LAB — URINALYSIS, ROUTINE W REFLEX MICROSCOPIC
Bilirubin Urine: NEGATIVE
Glucose, UA: NEGATIVE mg/dL
Hgb urine dipstick: NEGATIVE
Ketones, ur: NEGATIVE mg/dL
Leukocytes,Ua: NEGATIVE
Nitrite: NEGATIVE
Protein, ur: NEGATIVE mg/dL
Specific Gravity, Urine: 1.016 (ref 1.005–1.030)
pH: 6 (ref 5.0–8.0)

## 2024-03-19 LAB — CBC WITH DIFFERENTIAL/PLATELET
Abs Immature Granulocytes: 0.11 K/uL — ABNORMAL HIGH (ref 0.00–0.07)
Basophils Absolute: 0.1 K/uL (ref 0.0–0.1)
Basophils Relative: 1 %
Eosinophils Absolute: 0.2 K/uL (ref 0.0–0.5)
Eosinophils Relative: 2 %
HCT: 37.5 % (ref 36.0–46.0)
Hemoglobin: 12 g/dL (ref 12.0–15.0)
Immature Granulocytes: 1 %
Lymphocytes Relative: 21 %
Lymphs Abs: 1.7 K/uL (ref 0.7–4.0)
MCH: 29.8 pg (ref 26.0–34.0)
MCHC: 32 g/dL (ref 30.0–36.0)
MCV: 93.1 fL (ref 80.0–100.0)
Monocytes Absolute: 0.6 K/uL (ref 0.1–1.0)
Monocytes Relative: 7 %
Neutro Abs: 5.4 K/uL (ref 1.7–7.7)
Neutrophils Relative %: 68 %
Platelets: 338 K/uL (ref 150–400)
RBC: 4.03 MIL/uL (ref 3.87–5.11)
RDW: 14.5 % (ref 11.5–15.5)
WBC: 8.1 K/uL (ref 4.0–10.5)
nRBC: 0 % (ref 0.0–0.2)

## 2024-03-19 LAB — COMPREHENSIVE METABOLIC PANEL WITH GFR
ALT: 9 U/L (ref 0–44)
AST: 30 U/L (ref 15–41)
Albumin: 4.3 g/dL (ref 3.5–5.0)
Alkaline Phosphatase: 65 U/L (ref 38–126)
Anion gap: 11 (ref 5–15)
BUN: 12 mg/dL (ref 8–23)
CO2: 24 mmol/L (ref 22–32)
Calcium: 9.2 mg/dL (ref 8.9–10.3)
Chloride: 104 mmol/L (ref 98–111)
Creatinine, Ser: 0.88 mg/dL (ref 0.44–1.00)
GFR, Estimated: >60 mL/min
Glucose, Bld: 73 mg/dL (ref 70–99)
Potassium: 4 mmol/L (ref 3.5–5.1)
Sodium: 139 mmol/L (ref 135–145)
Total Bilirubin: 0.3 mg/dL (ref 0.0–1.2)
Total Protein: 7.1 g/dL (ref 6.5–8.1)

## 2024-03-19 MED ORDER — THIAMINE MONONITRATE 100 MG PO TABS
100.0000 mg | ORAL_TABLET | Freq: Every day | ORAL | Status: DC
  Administered 2024-03-19 – 2024-03-22 (×3): 100 mg via ORAL
  Filled 2024-03-19 (×4): qty 1

## 2024-03-19 MED ORDER — MORPHINE SULFATE (PF) 4 MG/ML IV SOLN: 4.0000 mg | Freq: Once | INTRAVENOUS | Status: DC

## 2024-03-19 MED ORDER — ACETAMINOPHEN 325 MG PO TABS: 650.0000 mg | ORAL_TABLET | Freq: Four times a day (QID) | ORAL | Status: DC | PRN

## 2024-03-19 MED ORDER — TRAZODONE HCL 50 MG PO TABS
100.0000 mg | ORAL_TABLET | Freq: Every day | ORAL | Status: DC
  Administered 2024-03-19 – 2024-03-22 (×3): 100 mg via ORAL
  Filled 2024-03-19 (×4): qty 2

## 2024-03-19 MED ORDER — ONDANSETRON HCL 4 MG/2ML IJ SOLN: 4.0000 mg | Freq: Four times a day (QID) | INTRAMUSCULAR | Status: DC | PRN

## 2024-03-19 MED ORDER — LACTATED RINGERS IV SOLN: INTRAVENOUS | Status: DC

## 2024-03-19 MED ORDER — IOHEXOL 350 MG/ML SOLN
75.0000 mL | Freq: Once | INTRAVENOUS | Status: AC | PRN
  Administered 2024-03-19: 75 mL via INTRAVENOUS

## 2024-03-19 MED ORDER — CARVEDILOL 3.125 MG PO TABS
6.2500 mg | ORAL_TABLET | Freq: Two times a day (BID) | ORAL | Status: DC
  Administered 2024-03-19 – 2024-03-22 (×6): 6.25 mg via ORAL
  Filled 2024-03-19 (×6): qty 2

## 2024-03-19 MED ORDER — ALBUTEROL SULFATE (2.5 MG/3ML) 0.083% IN NEBU: 2.5000 mg | INHALATION_SOLUTION | RESPIRATORY_TRACT | Status: DC | PRN

## 2024-03-19 MED ORDER — THIAMINE HCL 100 MG/ML IJ SOLN
100.0000 mg | Freq: Every day | INTRAMUSCULAR | Status: DC
  Administered 2024-03-21: 100 mg via INTRAVENOUS
  Filled 2024-03-19 (×4): qty 2

## 2024-03-19 MED ORDER — POLYETHYLENE GLYCOL 3350 17 G PO PACK
17.0000 g | PACK | Freq: Every day | ORAL | Status: DC
  Administered 2024-03-20 – 2024-03-22 (×3): 17 g via ORAL
  Filled 2024-03-19 (×4): qty 1

## 2024-03-19 MED ORDER — SENNA 8.6 MG PO TABS
1.0000 | ORAL_TABLET | Freq: Two times a day (BID) | ORAL | Status: DC
  Administered 2024-03-19 – 2024-03-20 (×2): 8.6 mg via ORAL
  Filled 2024-03-19 (×3): qty 1

## 2024-03-19 MED ORDER — ONDANSETRON HCL 4 MG/2ML IJ SOLN
4.0000 mg | Freq: Once | INTRAMUSCULAR | Status: AC
  Administered 2024-03-19: 4 mg via INTRAVENOUS
  Filled 2024-03-19: qty 2

## 2024-03-19 MED ORDER — FOLIC ACID 1 MG PO TABS
1.0000 mg | ORAL_TABLET | Freq: Every day | ORAL | Status: DC
  Administered 2024-03-19 – 2024-03-22 (×4): 1 mg via ORAL
  Filled 2024-03-19 (×4): qty 1

## 2024-03-19 MED ORDER — BUSPIRONE HCL 5 MG PO TABS
15.0000 mg | ORAL_TABLET | Freq: Three times a day (TID) | ORAL | Status: DC
  Administered 2024-03-19 – 2024-03-22 (×11): 15 mg via ORAL
  Filled 2024-03-19: qty 2
  Filled 2024-03-19 (×10): qty 1

## 2024-03-19 MED ORDER — PANTOPRAZOLE SODIUM 40 MG PO TBEC
40.0000 mg | DELAYED_RELEASE_TABLET | Freq: Two times a day (BID) | ORAL | Status: DC
  Administered 2024-03-19 – 2024-03-22 (×7): 40 mg via ORAL
  Filled 2024-03-19 (×7): qty 1

## 2024-03-19 MED ORDER — FOLIC ACID 1 MG PO TABS: 1.0000 mg | ORAL_TABLET | Freq: Every day | ORAL | Status: DC

## 2024-03-19 MED ORDER — LORAZEPAM 2 MG/ML IJ SOLN
1.0000 mg | INTRAMUSCULAR | Status: AC | PRN
  Administered 2024-03-21: 1 mg via INTRAVENOUS
  Filled 2024-03-19: qty 1

## 2024-03-19 MED ORDER — THIAMINE MONONITRATE 100 MG PO TABS: 100.0000 mg | ORAL_TABLET | Freq: Every day | ORAL | Status: DC

## 2024-03-19 MED ORDER — ALBUTEROL SULFATE (2.5 MG/3ML) 0.083% IN NEBU: 3.0000 mL | INHALATION_SOLUTION | Freq: Four times a day (QID) | RESPIRATORY_TRACT | Status: DC | PRN

## 2024-03-19 MED ORDER — ACETAMINOPHEN 325 MG PO TABS
650.0000 mg | ORAL_TABLET | Freq: Four times a day (QID) | ORAL | Status: DC | PRN
  Filled 2024-03-19: qty 2

## 2024-03-19 MED ORDER — OXYCODONE HCL 5 MG PO TABS
5.0000 mg | ORAL_TABLET | Freq: Four times a day (QID) | ORAL | Status: DC | PRN
  Administered 2024-03-19 – 2024-03-20 (×5): 5 mg via ORAL
  Filled 2024-03-19 (×6): qty 1

## 2024-03-19 MED ORDER — MORPHINE SULFATE (PF) 2 MG/ML IV SOLN
1.0000 mg | INTRAVENOUS | Status: DC | PRN
  Administered 2024-03-19 – 2024-03-22 (×8): 1 mg via INTRAVENOUS
  Filled 2024-03-19 (×8): qty 1

## 2024-03-19 MED ORDER — ENOXAPARIN SODIUM 40 MG/0.4ML IJ SOSY
40.0000 mg | PREFILLED_SYRINGE | INTRAMUSCULAR | Status: DC
  Administered 2024-03-19 – 2024-03-22 (×4): 40 mg via SUBCUTANEOUS
  Filled 2024-03-19 (×4): qty 0.4

## 2024-03-19 MED ORDER — BUPROPION HCL ER (XL) 150 MG PO TB24
150.0000 mg | ORAL_TABLET | ORAL | Status: DC
  Administered 2024-03-20 – 2024-03-22 (×3): 150 mg via ORAL
  Filled 2024-03-19 (×3): qty 1

## 2024-03-19 MED ORDER — HYDRALAZINE HCL 20 MG/ML IJ SOLN: 10.0000 mg | Freq: Four times a day (QID) | INTRAMUSCULAR | Status: DC | PRN

## 2024-03-19 MED ORDER — ACETAMINOPHEN 650 MG RE SUPP: 650.0000 mg | Freq: Four times a day (QID) | RECTAL | Status: DC | PRN

## 2024-03-19 MED ORDER — ADULT MULTIVITAMIN W/MINERALS CH
1.0000 | ORAL_TABLET | Freq: Every day | ORAL | Status: DC
  Administered 2024-03-19 – 2024-03-22 (×4): 1 via ORAL
  Filled 2024-03-19 (×4): qty 1

## 2024-03-19 MED ORDER — LORAZEPAM 1 MG PO TABS
1.0000 mg | ORAL_TABLET | ORAL | Status: AC | PRN
  Administered 2024-03-20 (×2): 1 mg via ORAL
  Filled 2024-03-19 (×2): qty 1

## 2024-03-19 MED ORDER — ONDANSETRON HCL 4 MG PO TABS: 4.0000 mg | ORAL_TABLET | Freq: Four times a day (QID) | ORAL | Status: DC | PRN

## 2024-03-19 MED ORDER — HYDROXYZINE HCL 25 MG PO TABS
25.0000 mg | ORAL_TABLET | Freq: Three times a day (TID) | ORAL | Status: DC
  Administered 2024-03-19 – 2024-03-22 (×11): 25 mg via ORAL
  Filled 2024-03-19 (×11): qty 1

## 2024-03-19 NOTE — ED Triage Notes (Addendum)
 Pt BIB [facility] from home, bent over to pick something off the ground, stood up too fast, and felt dizzy and fell. Denies hitting head, c/o back pain, bilateral rib pain, and neck pain. Pt has a hx of recent dizzy spells.    115/68 O2 99 122 CBG HR 102

## 2024-03-19 NOTE — Progress Notes (Addendum)
 Orthopedic Tech Progress Note Patient Details:  Terri Ayala [birth year] [id number]  Ortho Devices Type of Ortho Device: Thoracolumbar corset (TLSO) Ortho Device/Splint Location: BACK Ortho Device/Splint Interventions: Ordered, Application, Adjustment, Removal fitted patient with a SMALL TLSO    Post Interventions Patient Tolerated: Fair Instructions Provided: Care of device  [name] Pac 03/19/2024, 12:37 PM

## 2024-03-19 NOTE — ED Provider Notes (Signed)
 " [facility] Provider Note   CSN: [id number] Arrival date & time: 03/19/24  [id number]     Patient presents with: Terri Ayala Terri Ayala is a 69 y.o. female.   Patient presents to the emergency department for evaluation after a fall at home.  She reports that she went out into her garage to get some paper towels, bent over to pick them up and became dizzy when she stood back up.  Patient reports that she fell backwards, hit her head just a glance.  She reports that she has having neck pain, diffuse back pain, especially lower back.  Patient's ribs hurt in the back as well.  She does not complain of any extremity injury.       Prior to Admission medications  Medication Sig Start Date End Date Taking? Authorizing Provider  albuterol  (VENTOLIN  HFA) 108 (90 Base) MCG/ACT inhaler INHALE 2 PUFFS INTO THE LUNGS EVERY 6 HOURS AS NEEDED FOR WHEEZING OR SHORTNESS OF BREATH. 11/29/23   Gayle Numbers F, PA-C  alendronate  (FOSAMAX ) 70 MG tablet Take 70 mg by mouth every Monday.    [provider]  buPROPion  (WELLBUTRIN  XL) 150 MG 24 hr tablet Take 1 tablet (150 mg total) by mouth every morning. 09/14/23 03/15/24  Kober, Charles E, PA-C  busPIRone  (BUSPAR ) 15 MG tablet Take 15 mg by mouth 3 (three) times daily. 01/17/24   [provider]  Calcium  Carbonate-Vitamin D  (CALCIUM  600+D PO) Take 1 tablet by mouth daily.    [provider]  carvedilol  (COREG ) 6.25 MG tablet TAKE 1 TABLET BY MOUTH 2 TIMES DAILY WITH A MEAL. 03/01/24   Gayle, Kara F, PA-C  feeding supplement (ENSURE PLUS HIGH PROTEIN) LIQD Take 237 mLs by mouth 3 (three) times daily between meals. 08/07/23   Ghimire, Donalda CHRISTELLA, MD  fluticasone  (FLONASE ) 50 MCG/ACT nasal spray Place 2 sprays into both nostrils daily as needed for allergies.    [provider]  folic acid  (FOLVITE ) 1 MG tablet TAKE 1 TABLET BY MOUTH DAILY. 01/15/24   Chandra Toribio POUR, MD  gabapentin  (NEURONTIN ) 300  MG capsule TAKE 1 CAPSULE BY MOUTH 3 TIMES DAILY. 03/05/24   Gayle Numbers FALCON, PA-C  hydrOXYzine  (ATARAX ) 25 MG tablet Take 25 mg by mouth 3 (three) times daily. 01/18/23   [provider]  meloxicam  (MOBIC ) 15 MG tablet Take 15 mg by mouth daily. Patient taking differently: Take 15 mg by mouth 2 (two) times daily as needed.    [provider]  meloxicam  (MOBIC ) 15 MG tablet Take 1 tablet (15 mg total) by mouth daily. 12/05/23   Scherrie Lamarr SQUIBB, DPM  Multiple Vitamins-Minerals (ADULT GUMMY PO) Take 2 tablets by mouth daily.    [provider]  NON FORMULARY Testosterone  20 mg/49ml. Use small pea-sized amount once a week as directed 05/09/23   [provider]  NON FORMULARY Take 5 mg by mouth daily. Fyavolv . Hormone replacement    [provider]  norethindrone -ethinyl estradiol  (FEMHRT  1/5) 1-5 MG-MCG TABS tablet Take 1 tablet by mouth daily.    [provider]  oxyCODONE  (ROXICODONE ) 5 MG immediate release tablet Take 1 tablet (5 mg total) by mouth every 6 (six) hours as needed. 03/15/24 03/15/25  Agarwala, Anshul, MD  pantoprazole  (PROTONIX ) 40 MG tablet TAKE 1 TABLET BY MOUTH 2 TIMES DAILY. 01/15/24   Chandra Toribio POUR, MD  propranolol  (INDERAL ) 10 MG tablet Take 1-2 tablets at onset of panic no  more than 2 times daily 11/16/23   Kober, Charles E, PA-C  SUMAtriptan  (IMITREX ) 100 MG tablet TAKE 1 TABLET BY MOUTH AS NEEDED FOR MIGRAINE. MAY REPEAT IN 2 HOURS IF HEADACHE PERSISTS OR RECURS. MAX 2 TABLETS PER DAY 08/15/23   Gayle Numbers F, PA-C  Testosterone  20.25 MG/1.25GM (1.62%) GEL Place onto the skin. 08/09/23   [provider]  thiamine  (VITAMIN B1) 100 MG tablet Take 100 mg by mouth daily.    [provider]  traZODone  (DESYREL ) 100 MG tablet Take 100 mg by mouth at bedtime. 10/13/23   [provider]  tretinoin (RETIN-A) 0.025 % cream 1 application in the evening to face Externally Once a day, PM pea size amount; Duration: 30  days 12/06/21   [provider]    Allergies: Doxycycline, Tetracycline hcl, and Codeine    Review of Systems  Updated Vital Signs BP (!) 142/79   Pulse 90   Temp 98.5 F (36.9 C) (Oral)   Resp 15   SpO2 100%   Physical Exam Vitals and nursing note reviewed.  Constitutional:      General: She is not in acute distress.    Appearance: She is well-developed.  HENT:     Head: Normocephalic and atraumatic.     Mouth/Throat:     Mouth: Mucous membranes are moist.  Eyes:     General: Vision grossly intact. Gaze aligned appropriately.     Extraocular Movements: Extraocular movements intact.     Conjunctiva/sclera: Conjunctivae normal.  Cardiovascular:     Rate and Rhythm: Normal rate and regular rhythm.     Pulses: Normal pulses.     Heart sounds: Normal heart sounds, S1 normal and S2 normal. No murmur heard.    No friction rub. No gallop.  Pulmonary:     Effort: Pulmonary effort is normal. No respiratory distress.     Breath sounds: Normal breath sounds.  Abdominal:     General: Bowel sounds are normal.     Palpations: Abdomen is soft.     Tenderness: There is no abdominal tenderness. There is no guarding or rebound.     Hernia: No hernia is present.  Musculoskeletal:        General: No swelling.     Cervical back: Full passive range of motion without pain, normal range of motion and neck supple. No spinous process tenderness or muscular tenderness. Normal range of motion.     Lumbar back: Spasms and tenderness present.     Right lower leg: No edema.     Left lower leg: No edema.  Skin:    General: Skin is warm and dry.     Capillary Refill: Capillary refill takes less than 2 seconds.     Findings: No ecchymosis, erythema, rash or wound.  Neurological:     General: No focal deficit present.     Mental Status: She is alert and oriented to person, place, and time.     GCS: GCS eye subscore is 4. GCS verbal subscore is 5. GCS motor subscore is 6.     Cranial  Nerves: Cranial nerves 2-12 are intact.     Sensory: Sensation is intact.     Motor: Motor function is intact.     Coordination: Coordination is intact.  Psychiatric:        Attention and Perception: Attention normal.        Mood and Affect: Mood normal.        Speech: Speech normal.  Behavior: Behavior normal.     (all labs ordered are listed, but only abnormal results are displayed) Labs Reviewed  CBC WITH DIFFERENTIAL/PLATELET - Abnormal; Notable for the following components:      Result Value   Abs Immature Granulocytes 0.11 (*)    All other components within normal limits  I-STAT CHEM 8, ED - Abnormal; Notable for the following components:   Calcium , Ion 1.11 (*)    All other components within normal limits  COMPREHENSIVE METABOLIC PANEL WITH GFR  URINALYSIS, ROUTINE W REFLEX MICROSCOPIC    EKG: EKG Interpretation Date/Time:  Tuesday March 19 2024 03:03:26 EST Ventricular Rate:  83 PR Interval:  149 QRS Duration:  106 QT Interval:  399 QTC Calculation: 469 R Axis:   3  Text Interpretation: Sinus rhythm Consider left atrial enlargement No significant change since last tracing Confirmed by Haze Lonni PARAS [phone]) on 03/19/2024 3:35:08 AM  Radiology: CT L-SPINE NO CHARGE Result Date: 03/19/2024 EXAM: CT OF THE LUMBAR SPINE WITHOUT CONTRAST 03/19/2024 04:36:37 AM TECHNIQUE: CT of the lumbar spine was performed without the administration of intravenous contrast. Multiplanar reformatted images are provided for review. Automated exposure control, iterative reconstruction, and/or weight based adjustment of the mA/kV was utilized to reduce the radiation dose to as low as reasonably achievable. COMPARISON: CT chest, abdomen, and pelvis, thoracic spine CT reported separately today. CT abdomen and pelvis 07/24/2023. CLINICAL HISTORY: 69 year old female with fall from standing and pain. FINDINGS: Normal lumbar segmentation, concordant with the thoracic CT today. BONES AND  ALIGNMENT: On these images, the T12 compression fracture appears unhealed and is likely to be acute or subacute (coronal image 34). See additional details on thoracic CT reported separately today. Normal lumbar vertebral body heights. Stable lumbar lordosis since last year. No acute fracture or suspicious bone lesion. Normal alignment. SOFT TISSUES: Lumbar paraspinal soft tissues within normal limits. DEGENERATIVE CHANGES: Generally mild for age lumbar spine degeneration. Chronic disc and endplate degeneration at L5-S1. Capacious underlying lumbar spinal canal. IMPRESSION: 1. On these images, the T12 compression fracture appears unhealed and likely acute or subacute. See additional details on thoracic CT reported separately today. 2. No acute traumatic injury identified in the lumbar spine. 3. CT chest, abdomen, and pelvis today reported separately. Electronically signed by: Helayne Hurst MD 03/19/2024 05:26 AM EST RP Workstation: [id number]   CT CHEST ABDOMEN PELVIS W CONTRAST Result Date: 03/19/2024 EXAM: CT CHEST, ABDOMEN AND PELVIS WITH CONTRAST 03/19/2024 04:36:37 AM TECHNIQUE: CT of the chest, abdomen and pelvis was performed with the administration of 75 mL of iohexol  (OMNIPAQUE ) 350 MG/ML injection. Multiplanar reformatted images are provided for review. Automated exposure control, iterative reconstruction, and/or weight based adjustment of the mA/kV was utilized to reduce the radiation dose to as low as reasonably achievable. COMPARISON: CT chest, abdomen, and pelvis 07/24/2023. CT thoracic and lumbar spine, reported separately today. Thyroid  ultrasound 02/11/2021. CLINICAL HISTORY: 69 year old female. Polytrauma, blunt. Fall from standing. Dizziness. Pain. FINDINGS: CHEST: MEDIASTINUM AND LYMPH NODES: Heart and pericardium are unremarkable. The central airways are clear. No mediastinal, hilar or axillary lymphadenopathy. Heterogeneous thyroid  with chronic enlargement of the right lobe appears stable from  prior CT and was previously evaluated on dedicated thyroid  ultrasound. Size is stable at the upper limits of normal. LUNGS AND PLEURA: Larger lung volumes. Linear peribronchial opacity in the lingula with enhancement more resembles atelectasis than infection. Mild superimposed bilateral lung base scarring, including in the anterior basal segment of the left lower lobe and in the lateral segment of  the right middle lobe. No convincing pulmonary contusion. No pleural effusion. No pneumothorax. ABDOMEN AND PELVIS: LIVER: Unremarkable. GALLBLADDER AND BILE DUCTS: Chronic cholecystectomy. No biliary ductal dilatation. SPLEEN: No acute abnormality. PANCREAS: No acute abnormality. ADRENAL GLANDS: No acute abnormality. KIDNEYS, URETERS AND BLADDER: Renal enhancement and contrast excretion are symmetric and normal. Normal ureters and early contrast excretion in the urinary bladder on the delayed images. No stones in the kidneys or ureters. No hydronephrosis. No perinephric or periureteral stranding. GI AND BOWEL: Stomach demonstrates no acute abnormality. Gastric antrum and pylorus appear thickened (series 7 image 42) but there is no convincing acute gastroduodenal or other bowel loop inflammation. Redundant large bowel especially the sigmoid colon in the pelvis. Distal large bowel retained stool is mild to moderate. More upstream large bowel fluid is noted through the transverse colon. Fluid containing small bowel loops. No dilated large or small bowel. Cecum is on a lax mesentery. Terminal ileum is decompressed. Normal appendix on series 3 image 76. REPRODUCTIVE ORGANS: No acute abnormality. PERITONEUM AND RETROPERITONEUM: No ascites. No free air. VASCULATURE: Aorta is normal in caliber. Moderate calcified aortoiliac atherosclerosis in the abdomen and pelvis. Major arterial structures and the portal venous system are enhancing and patent. Mild for age calcified atherosclerosis in the chest. ABDOMINAL AND PELVIS LYMPH  NODES: No lymphadenopathy. BONES AND SOFT TISSUES: Numerous nonacute bilateral rib fractures, including chronic or healing fractures of the left lateral 2nd through 7th ribs, and superimposed on the left posterior 4th through 9th rib fractures. Similar chronic posterolateral right 5th through 10th rib fractures. No acute rib fracture identified. Sacrum, SI joints, pelvis and proximal femurs are stable and intact. However, there is chronic avascular necrosis of the right femoral head. No superficial soft tissue injury. IMPRESSION: 1. Numerous nonacute bilateral rib fractures. See also Thoracic spine CT reported separately today. No other acute traumatic injury identified in the chest, abdomen, or pelvis. 2. Fluid containing nondilated small and large bowel loops. Enteritis/diarrhea not excluded. No other acute or inflammatory process identified in the abdomen or pelvis. Normal appendix. 3. Chronic right femoral head AVN. Aortic Atherosclerosis (ICD10-I70.0). Electronically signed by: Helayne Hurst MD 03/19/2024 05:21 AM EST RP Workstation: [id number]   CT T-SPINE NO CHARGE Result Date: 03/19/2024 EXAM: CT THORACIC SPINE WITHOUT CONTRAST 03/19/2024 04:36:37 AM TECHNIQUE: CT of the thoracic spine was performed without the administration of intravenous contrast. Multiplanar reformatted images are provided for review. Automated exposure control, iterative reconstruction, and/or weight based adjustment of the mA/kV was utilized to reduce the radiation dose to as low as reasonably achievable. COMPARISON: CT chest, abdomen, and pelvis reported separately today. Previous thoracic spine CT 07/24/2023. CT of the cervical spine reported separately today. CLINICAL HISTORY: 69 year old female with fall from standing, dizziness, and pain. FINDINGS: Small ribs at T12 on the prior CT with otherwise normal thoracic segmentation. BONES AND ALIGNMENT: T12 compression fracture (sagittal image 37) is new from 07/24/2023, with 50% loss  of vertebral body height. No significant retropulsion. T12 visible posterior elements appear intact and aligned. T5 mild inferior endplate compression asymmetric to the left is also new, but appears sclerotic. Minimal loss of height. No retropulsion. T5 posterior elements appear intact and aligned. Mildly exaggerated thoracic kyphosis has not significantly changed. Other thoracic vertebral height is maintained and stable. Chronic posterior rib fractures on the left at the 2nd and 5th ribs and on the right at the 9th through 11th ribs. Right 10th transverse process fracture appears new since last year but sclerotic and chronic (series  5 image 61 and series 3 image 108). No suspicious bone lesion. Normal alignment except for the T12 compression fracture and T5 endplate compression, and mildly exaggerated thoracic kyphosis. SOFT TISSUES: Thoracic paraspinal soft tissues are within normal limits. Chest and abdomen are reported separately today. DEGENERATIVE CHANGES: Mostly age appropriate thoracic spine degeneration. No high grade thoracic spinal stenosis by CT. IMPRESSION: 1. Age indeterminate T12 compression fracture (50% loss of height, no retropulsion or other complicating features) is new since 07/24/2023. If specific therapy such as vertebroplasty is desired, MRI without contrast or a nuclear medicine whole-body bone scan would best determine acuity and candidacy for intervention. 2. Mild T5 compression fracture and right T10 transverse process fracture are also new but appear chronic by CT. Superimposed chronic bilateral posterior rib fractures. 3. CT chest, abdomen, and pelvis reported separately today. Electronically signed by: Helayne Hurst MD 03/19/2024 05:05 AM EST RP Workstation: [id number]   CT CERVICAL SPINE WO CONTRAST Result Date: 03/19/2024 EXAM: CT CERVICAL SPINE WITHOUT CONTRAST 03/19/2024 04:36:37 AM TECHNIQUE: CT of the cervical spine was performed without the administration of intravenous  contrast. Multiplanar reformatted images are provided for review. Automated exposure control, iterative reconstruction, and/or weight based adjustment of the mA/kV was utilized to reduce the radiation dose to as low as reasonably achievable. COMPARISON: CT head reported separately today. Cervical spine CT 09/07/2023. Prior thyroid  ultrasound 02/11/2021. CLINICAL HISTORY: 69 year old female. Neck trauma, fall from standing, dizziness, pain. FINDINGS: BONES AND ALIGNMENT: Stable cervical lordosis. Underlying chronic dextroconvex cervical scoliosis. C2 spina bifida occulta, normal variant. No acute fracture or traumatic malalignment. DEGENERATIVE CHANGES: Posterior element ankylosis from the C2 through the C5 level. Stable superimposed degenerative mild anterolisthesis of C5 on C6, with chronic severe left side facet arthropathy at that level. Chronic severe disc and endplate degeneration at C6-C7, faint vacuum disc there now. Chronic posterior occipital condyle-C1 advanced degeneration on the left side. Capacious CT appearance of the cervical spinal canal. SOFT TISSUES: No prevertebral soft tissue swelling. Chronic homogeneous thyroid  enlargement, evaluated on prior imaging. Calcified cervical carotid artery atherosclerosis in the bilateral neck. IMPRESSION: 1. No acute traumatic injury identified in the cervical spine. 2. Advanced chronic cervical spine degeneration with widespread posterior element ankylosis superimposed on scoliosis, mild spondylolisthesis Electronically signed by: Helayne Hurst MD 03/19/2024 04:55 AM EST RP Workstation: [id number]   CT HEAD WO CONTRAST ( ) Result Date: 03/19/2024 EXAM: CT HEAD WITHOUT CONTRAST 03/19/2024 04:36:37 AM TECHNIQUE: CT of the head was performed without the administration of intravenous contrast. Automated exposure control, iterative reconstruction, and/or weight based adjustment of the mA/kV was utilized to reduce the radiation dose to as low as reasonably  achievable. COMPARISON: Brain MRI 06/23/2022, CT head 08/08/2023. CLINICAL HISTORY: 69 year old female. Fall from standing, dizziness, pain. FINDINGS: BRAIN AND VENTRICLES: No acute hemorrhage. No evidence of acute infarct. No hydrocephalus. No extra-axial collection. No mass effect or midline shift. Brain volume not significantly changed since 2024. Chronic mostly periventricular but intense white matter changes also appears stable from previous MRI appearance. Chronic dystrophic calcification in the medial left cerebellum on series 3 image 6 has not significantly changed since 2016. Punctate calcified atherosclerosis at the skull base. No suspicious intracranial vascular hyperdensity. ORBITS: No acute orbital injury identified. SINUSES: Paranasal sinuses, tympanic cavities and mastoids remain well aerated. SOFT TISSUES AND SKULL: No acute scalp soft tissue injury identified. No skull fracture. IMPRESSION: 1. No acute intracranial abnormality or acute traumatic injury identified. Port 2. Stable chronic white matter disease, small chronic dystrophic calcification in the  left cerebellum. Electronically signed by: Helayne Hurst MD 03/19/2024 04:51 AM EST RP Workstation: [id number]     Procedures   Medications Ordered in the ED  albuterol  (PROVENTIL ) (2.5 MG/3ML) 0.083% nebulizer solution 3 mL (has no administration in time range)  busPIRone  (BUSPAR ) tablet 15 mg (has no administration in time range)  folic acid  (FOLVITE ) tablet 1 mg (has no administration in time range)  oxyCODONE  (Oxy IR/ROXICODONE ) immediate release tablet 5 mg (has no administration in time range)  pantoprazole  (PROTONIX ) EC tablet 40 mg (has no administration in time range)  thiamine  (VITAMIN B1) tablet 100 mg (has no administration in time range)  acetaminophen  (TYLENOL ) tablet 650 mg (has no administration in time range)  ondansetron  (ZOFRAN ) injection 4 mg (has no administration in time range)  lactated ringers  infusion (  Intravenous New Bag/Given 03/19/24 0635)  iohexol  (OMNIPAQUE ) 350 MG/ML injection 75 mL (75 mLs Intravenous Contrast Given 03/19/24 0437)  ondansetron  (ZOFRAN ) injection 4 mg (4 mg Intravenous Given 03/19/24 0636)                                    Medical Decision Making Amount and/or Complexity of Data Reviewed Labs: ordered. Decision-making details documented in ED Course. Radiology: ordered and independent interpretation performed. Decision-making details documented in ED Course.  Risk Prescription drug management.   Differential diagnosis considered includes, but not limited to: Blunt trauma including intracranial injury, spinal injury, thoracic injury, intra-abdominal and retroperitoneal injury, orthopedic injury  Presents to the emergency department for evaluation of a ground-level fall.  Patient reports that she bent over to pick up an object, stood up and became dizzy causing the fall.  She is complaining of fairly severe back pain.  Pain is in the lower mid back and radiates around the abdomen.  Pain significantly worsens with any movement.  Patient had workup including trauma scans.  There is evidence of a T12 compression fracture which is symptomatic and fits her pain pattern and examination.  Discussed briefly with neurosurgery.  TLSO, can be up for therapies while wearing brace.  Can follow-up with neurosurgery as an outpatient after discharge.  Will admit to hospitalist service as patient is in need of pain control and likely physical therapy evaluation.     Final diagnoses:  Fall, initial encounter  T12 compression fracture, initial encounter [facility])    ED Discharge Orders     None          Cheick Suhr, Lonni PARAS, MD 03/19/24 [phone]  "

## 2024-03-19 NOTE — Hospital Course (Signed)
" °  69 year old female phx of alcohol  use disorder, peripheral neuropathy, hypertension, hyperlipidemia, and GERD presented to ED for evaluation for fall at home.  Patient reported that she went out of the garage to get some paperwork bent over to pick up then and became dizzy when she stood up and fell backward hit her head just a glass.  Reported having neck pain diffuse back pain especially lower back.  Also complaining about rib cage pain.  Denies any upper or lower extremity injury.  Patient has right thumb shaft fracture s/p post closed reduction with percutaneous pinning conducted on 1/16.  In the ED patient is hemodynamically stable. CMP unremarkable.  CBC unremarkable.  CT head no acute intracranial abnormality. CT cervical spine no evidence of fracture or dislocation.  Advanced cervical spine degeneration.  CT chest abdomen pelvis showing numerous nonacute bilateral rib fracture.  No other acute traumatic injury of the chest, abdomen and pelvis.  Fluid containing nondilated to small and large bowel loops.  CT lumbar and thoracic spine showed T12 compression fracture unhealed likely acute versus subacute.  In the ED patient received morphine  and Zofran .  Hospitalist consulted for further management T12 compression fracture and numerous bilateral rib fracture.  Patient will benefit from pain control, TLSO brace and PT OT evaluation.  Per EDP pending neurosurgery calling back.         "

## 2024-03-19 NOTE — H&P (Signed)
 " History and Physical    Terri Ayala FMW:[id number] DOB: [birth year] DOA: 03/19/2024  PCP: Gayle Saddie FALCON, PA-C  Patient coming from: home by EMS  I have personally briefly reviewed patient's old medical records in [facility]  Chief Complaint: back pain  HPI: Terri Ayala is a 69 y.o. female with medical history significant of osteoporosis with wrist and rib fracture history, hypertension on carvedilol , hyperlipidemia, lower extremity neuropathy on gabapentin , alcoholism, and depression and anxiety on buspirone , bupropion , and hydroxyzine . She was brought to the emergency department after bending over to get paper towels, feeling dizzy, and falling onto her car in her garage. She did not lose consciousness, but landed on her back and is in severe pain. The pain is diffuse, but most severe in her lower back and has been constant since she fell. The pain is worsened with sitting up and movement of the lower extremities.    ED Course:  Pt is hemodynamically stable upon presentation.  CMP, CBC, and UA are unremarkable. CT C Spine shows no acute findings. CT abdomen pelvis shows no acute findings. CT head shows no acute findings. CT T spine shows T12 compression fracture.   Review of Systems:  Constitutional: negative for fever and chills Respiratory: negative for cough and SOB Cardiovascular: negative for palpitations Neurological: dizziness Musculoskeletal: pain in lower back, posterior rib pain bilaterally    Past Medical History:  Diagnosis Date   Alcohol  withdrawal (HCC) 11/2017; 01/04/2018   Alcoholism (HCC)    Anxiety    Arthritis    hands (01/04/2018)   ASYMPTOMATIC POSTMENOPAUSAL STATUS 08/17/2009   Chronic lower back pain    Depression    DYSPHAGIA 06/19/2007   Fatty liver, alcoholic    GERD (gastroesophageal reflux disease)    GOITER, MULTINODULAR 08/17/2009   History of colonic polyps 03/25/2020   Hyperlipidemia    Hypertension    Migraines    not sure  what triggers them; I'll have 1 q couple weeks or more; come 2 days in a row when they come (01/04/2018)   Mitral valve prolapse    OSTEOPOROSIS 08/17/2009   PONV (postoperative nausea and vomiting)    Supraventricular tachycardia     Past Surgical History:  Procedure Laterality Date   BREAST BIOPSY Right    benign   CATARACT EXTRACTION W/ INTRAOCULAR LENS  IMPLANT, BILATERAL     CERVICAL CONE BIOPSY  2000s   CERVIX LESION DESTRUCTION  1991   dysplasia; lesions   CHOLECYSTECTOMY N/A 03/14/2013   Procedure: LAPAROSCOPIC CHOLECYSTECTOMY WITH ATTEMPTED INTRAOPERATIVE CHOLANGIOGRAM;  Surgeon: Vicenta DELENA Poli, MD;  Location: [facility] OR;  Service: General;  Laterality: N/A;   CLOSED REDUCTION METACARPAL WITH PERCUTANEOUS PINNING Right 03/15/2024   Procedure: CLOSED REDUCTION, FRACTURE, METACARPAL BONE, WITH PERCUTANEOUS PINNING;  Surgeon: Arlinda Buster, MD;  Location: [facility];  Service: Orthopedics;  Laterality: Right;  RIGHT THUMB CLOSED VS OPEN REDUCTION INTERNAL FIXATION METACARPAL FRACTURE   HARDWARE REMOVAL Left 07/22/2021   Procedure: HARDWARE REMOVAL;  Surgeon: Celena Sharper, MD;  Location: [facility] OR;  Service: Orthopedics;  Laterality: Left;   laporoscopic abdominal surgery     for endometriosis   LEFT HEART CATH AND CORONARY ANGIOGRAPHY N/A 05/07/2018   Procedure: LEFT HEART CATH AND CORONARY ANGIOGRAPHY;  Surgeon: Claudene Victory ORN, MD;  Location: [facility] INVASIVE [facility];  Service: Cardiovascular;  Laterality: N/A;   ORIF ANKLE FRACTURE Left 02/11/2021   Procedure: OPEN REDUCTION INTERNAL FIXATION (ORIF) ANKLE FRACTURE;  Surgeon: Celena Sharper,  MD;  Location: [facility] OR;  Service: Orthopedics;  Laterality: Left;   TONSILLECTOMY      Social History  reports that she quit smoking about 50 years ago. Her smoking use included cigarettes. She started smoking about 57 years ago. She has a 7 pack-year smoking history. She has been exposed to tobacco smoke. She has never used  smokeless tobacco. She reports that she does not currently use alcohol  after a past usage of about 28.0 standard drinks of alcohol  per week. She reports that she does not use drugs.  Allergies[1]  Family History  Problem Relation Age of Onset   Colon cancer Father        Deceased, 19   Osteoporosis Mother        Living, 40   Healthy Brother    Healthy Brother    Healthy Son    Healthy Son    Thyroid  disease Neg Hx    Goiter Neg Hx     Prior to Admission medications  Medication Sig Start Date End Date Taking? Authorizing Provider  albuterol  (VENTOLIN  HFA) 108 (90 Base) MCG/ACT inhaler INHALE 2 PUFFS INTO THE LUNGS EVERY 6 HOURS AS NEEDED FOR WHEEZING OR SHORTNESS OF BREATH. 11/29/23   Gayle Numbers F, PA-C  alendronate  (FOSAMAX ) 70 MG tablet Take 70 mg by mouth every Monday.    [provider]  buPROPion  (WELLBUTRIN  XL) 150 MG 24 hr tablet Take 1 tablet (150 mg total) by mouth every morning. 09/14/23 03/15/24  Kober, Charles E, PA-C  busPIRone  (BUSPAR ) 15 MG tablet Take 15 mg by mouth 3 (three) times daily. 01/17/24   [provider]  Calcium  Carbonate-Vitamin D  (CALCIUM  600+D PO) Take 1 tablet by mouth daily.    [provider]  carvedilol  (COREG ) 6.25 MG tablet TAKE 1 TABLET BY MOUTH 2 TIMES DAILY WITH A MEAL. 03/01/24   Gayle, Kara F, PA-C  feeding supplement (ENSURE PLUS HIGH PROTEIN) LIQD Take 237 mLs by mouth 3 (three) times daily between meals. 08/07/23   Ghimire, Donalda HERO, MD  fluticasone  (FLONASE ) 50 MCG/ACT nasal spray Place 2 sprays into both nostrils daily as needed for allergies.    [provider]  folic acid  (FOLVITE ) 1 MG tablet TAKE 1 TABLET BY MOUTH DAILY. 01/15/24   Chandra Toribio POUR, MD  gabapentin  (NEURONTIN ) 300 MG capsule TAKE 1 CAPSULE BY MOUTH 3 TIMES DAILY. 03/05/24   Gayle Numbers FALCON, PA-C  hydrOXYzine  (ATARAX ) 25 MG tablet Take 25 mg by mouth 3 (three) times daily. 01/18/23   [provider]  meloxicam  (MOBIC ) 15 MG tablet Take  15 mg by mouth daily. Patient taking differently: Take 15 mg by mouth 2 (two) times daily as needed.    [provider]  meloxicam  (MOBIC ) 15 MG tablet Take 1 tablet (15 mg total) by mouth daily. 12/05/23   Scherrie Lamarr SQUIBB, DPM  Multiple Vitamins-Minerals (ADULT GUMMY PO) Take 2 tablets by mouth daily.    [provider]  NON FORMULARY Testosterone  20 mg/1ml. Use small pea-sized amount once a week as directed 05/09/23   [provider]  NON FORMULARY Take 5 mg by mouth daily. Fyavolv . Hormone replacement    [provider]  norethindrone -ethinyl estradiol  (FEMHRT  1/5) 1-5 MG-MCG TABS tablet Take 1 tablet by mouth daily.    [provider]  oxyCODONE  (ROXICODONE ) 5 MG immediate release tablet Take 1 tablet (5 mg total) by mouth every 6 (six) hours as needed. 03/15/24 03/15/25  Arlinda Buster, MD  pantoprazole  (PROTONIX )  40 MG tablet TAKE 1 TABLET BY MOUTH 2 TIMES DAILY. 01/15/24   Chandra Toribio POUR, MD  propranolol  (INDERAL ) 10 MG tablet Take 1-2 tablets at onset of panic no more than 2 times daily 11/16/23   Kober, Charles E, PA-C  SUMAtriptan  (IMITREX ) 100 MG tablet TAKE 1 TABLET BY MOUTH AS NEEDED FOR MIGRAINE. MAY REPEAT IN 2 HOURS IF HEADACHE PERSISTS OR RECURS. MAX 2 TABLETS PER DAY 08/15/23   Gayle Numbers F, PA-C  Testosterone  20.25 MG/1.25GM (1.62%) GEL Place onto the skin. 08/09/23   [provider]  thiamine  (VITAMIN B1) 100 MG tablet Take 100 mg by mouth daily.    [provider]  traZODone  (DESYREL ) 100 MG tablet Take 100 mg by mouth at bedtime. 10/13/23   [provider]  tretinoin (RETIN-A) 0.025 % cream 1 application in the evening to face Externally Once a day, PM pea size amount; Duration: 30 days 12/06/21   [provider]    Physical Exam: Vitals:   03/19/24 0304 03/19/24 0613 03/19/24 0630  BP: (!) 142/98 109/76 (!) 142/79  Pulse: 81 95 90  Resp: (!) 23 18 15   Temp: 98.2 F (36.8 C) 98.5 F (36.9 C)    TempSrc:  Oral   SpO2: 100% 100% 100%    Constitutional: NAD, calm, comfortable Vitals:   03/19/24 0304 03/19/24 0613 03/19/24 0630  BP: (!) 142/98 109/76 (!) 142/79  Pulse: 81 95 90  Resp: (!) 23 18 15   Temp: 98.2 F (36.8 C) 98.5 F (36.9 C)   TempSrc:  Oral   SpO2: 100% 100% 100%   Eyes: lids and conjunctivae normal Neck: normal, supple, no masses Respiratory: clear to auscultation bilaterally, no wheezing, no crackles. Normal respiratory effort. No accessory muscle use.  Cardiovascular: Regular rate and rhythm, no murmurs / rubs / gallops. No extremity edema.  Abdomen: no tenderness, no masses palpated. No hepatosplenomegaly.  Musculoskeletal: no clubbing / cyanosis.Normal muscle tone. Pain in back with plantar flexion of bilateral feet, more severe on the L side. Pain with flexion of L knee. Skin: no rashes, lesions, ulcers.  Neurologic: CN 2-12 grossly intact. Sensation intact, DTR normal.  Psychiatric: Normal judgment and insight. Alert and oriented x 3. Normal mood.    Labs on Admission: I have personally reviewed following labs and imaging studies  CBC: Recent Labs  Lab 03/19/24 0326 03/19/24 0330  WBC 8.1  --   NEUTROABS 5.4  --   HGB 12.0 12.9  HCT 37.5 38.0  MCV 93.1  --   PLT 338  --     Basic Metabolic Panel: Recent Labs  Lab 03/19/24 0326 03/19/24 0330  NA 139 142  K 4.0 4.1  CL 104 103  CO2 24  --   GLUCOSE 73 74  BUN 12 14  CREATININE 0.88 0.90  CALCIUM  9.2  --     GFR: Estimated Creatinine Clearance: 52.2 mL/min (by C-G formula based on SCr of 0.9 mg/dL).  Liver Function Tests: Recent Labs  Lab 03/19/24 0326  AST 30  ALT 9  ALKPHOS 65  BILITOT 0.3  PROT 7.1  ALBUMIN 4.3    Urine analysis:    Component Value Date/Time   COLORURINE STRAW (A) 03/19/2024 0657   APPEARANCEUR CLEAR 03/19/2024 0657   LABSPEC 1.016 03/19/2024 0657   LABSPEC 1.030 09/01/2017 1443   PHURINE 6.0 03/19/2024 0657   GLUCOSEU NEGATIVE 03/19/2024  0657   HGBUR NEGATIVE 03/19/2024 0657   BILIRUBINUR NEGATIVE 03/19/2024 0657   BILIRUBINUR  NEGATIVE 10/19/2023 0938   KETONESUR NEGATIVE 03/19/2024 0657   PROTEINUR NEGATIVE 03/19/2024 0657   UROBILINOGEN 0.2 10/19/2023 0938   UROBILINOGEN 0.2 07/25/2007 0900   NITRITE NEGATIVE 03/19/2024 0657   LEUKOCYTESUR NEGATIVE 03/19/2024 0657    Radiological Exams on Admission: CT L-SPINE NO CHARGE Result Date: 03/19/2024 EXAM: CT OF THE LUMBAR SPINE WITHOUT CONTRAST 03/19/2024 04:36:37 AM TECHNIQUE: CT of the lumbar spine was performed without the administration of intravenous contrast. Multiplanar reformatted images are provided for review. Automated exposure control, iterative reconstruction, and/or weight based adjustment of the mA/kV was utilized to reduce the radiation dose to as low as reasonably achievable. COMPARISON: CT chest, abdomen, and pelvis, thoracic spine CT reported separately today. CT abdomen and pelvis 07/24/2023. CLINICAL HISTORY: 69 year old female with fall from standing and pain. FINDINGS: Normal lumbar segmentation, concordant with the thoracic CT today. BONES AND ALIGNMENT: On these images, the T12 compression fracture appears unhealed and is likely to be acute or subacute (coronal image 34). See additional details on thoracic CT reported separately today. Normal lumbar vertebral body heights. Stable lumbar lordosis since last year. No acute fracture or suspicious bone lesion. Normal alignment. SOFT TISSUES: Lumbar paraspinal soft tissues within normal limits. DEGENERATIVE CHANGES: Generally mild for age lumbar spine degeneration. Chronic disc and endplate degeneration at L5-S1. Capacious underlying lumbar spinal canal. IMPRESSION: 1. On these images, the T12 compression fracture appears unhealed and likely acute or subacute. See additional details on thoracic CT reported separately today. 2. No acute traumatic injury identified in the lumbar spine. 3. CT chest, abdomen, and pelvis  today reported separately. Electronically signed by: Helayne Hurst MD 03/19/2024 05:26 AM EST RP Workstation: [id number]   CT CHEST ABDOMEN PELVIS W CONTRAST Result Date: 03/19/2024 EXAM: CT CHEST, ABDOMEN AND PELVIS WITH CONTRAST 03/19/2024 04:36:37 AM TECHNIQUE: CT of the chest, abdomen and pelvis was performed with the administration of 75 mL of iohexol  (OMNIPAQUE ) 350 MG/ML injection. Multiplanar reformatted images are provided for review. Automated exposure control, iterative reconstruction, and/or weight based adjustment of the mA/kV was utilized to reduce the radiation dose to as low as reasonably achievable. COMPARISON: CT chest, abdomen, and pelvis 07/24/2023. CT thoracic and lumbar spine, reported separately today. Thyroid  ultrasound 02/11/2021. CLINICAL HISTORY: 69 year old female. Polytrauma, blunt. Fall from standing. Dizziness. Pain. FINDINGS: CHEST: MEDIASTINUM AND LYMPH NODES: Heart and pericardium are unremarkable. The central airways are clear. No mediastinal, hilar or axillary lymphadenopathy. Heterogeneous thyroid  with chronic enlargement of the right lobe appears stable from prior CT and was previously evaluated on dedicated thyroid  ultrasound. Size is stable at the upper limits of normal. LUNGS AND PLEURA: Larger lung volumes. Linear peribronchial opacity in the lingula with enhancement more resembles atelectasis than infection. Mild superimposed bilateral lung base scarring, including in the anterior basal segment of the left lower lobe and in the lateral segment of the right middle lobe. No convincing pulmonary contusion. No pleural effusion. No pneumothorax. ABDOMEN AND PELVIS: LIVER: Unremarkable. GALLBLADDER AND BILE DUCTS: Chronic cholecystectomy. No biliary ductal dilatation. SPLEEN: No acute abnormality. PANCREAS: No acute abnormality. ADRENAL GLANDS: No acute abnormality. KIDNEYS, URETERS AND BLADDER: Renal enhancement and contrast excretion are symmetric and normal. Normal ureters  and early contrast excretion in the urinary bladder on the delayed images. No stones in the kidneys or ureters. No hydronephrosis. No perinephric or periureteral stranding. GI AND BOWEL: Stomach demonstrates no acute abnormality. Gastric antrum and pylorus appear thickened (series 7 image 42) but there is no convincing acute gastroduodenal or other bowel loop inflammation.  Redundant large bowel especially the sigmoid colon in the pelvis. Distal large bowel retained stool is mild to moderate. More upstream large bowel fluid is noted through the transverse colon. Fluid containing small bowel loops. No dilated large or small bowel. Cecum is on a lax mesentery. Terminal ileum is decompressed. Normal appendix on series 3 image 76. REPRODUCTIVE ORGANS: No acute abnormality. PERITONEUM AND RETROPERITONEUM: No ascites. No free air. VASCULATURE: Aorta is normal in caliber. Moderate calcified aortoiliac atherosclerosis in the abdomen and pelvis. Major arterial structures and the portal venous system are enhancing and patent. Mild for age calcified atherosclerosis in the chest. ABDOMINAL AND PELVIS LYMPH NODES: No lymphadenopathy. BONES AND SOFT TISSUES: Numerous nonacute bilateral rib fractures, including chronic or healing fractures of the left lateral 2nd through 7th ribs, and superimposed on the left posterior 4th through 9th rib fractures. Similar chronic posterolateral right 5th through 10th rib fractures. No acute rib fracture identified. Sacrum, SI joints, pelvis and proximal femurs are stable and intact. However, there is chronic avascular necrosis of the right femoral head. No superficial soft tissue injury. IMPRESSION: 1. Numerous nonacute bilateral rib fractures. See also Thoracic spine CT reported separately today. No other acute traumatic injury identified in the chest, abdomen, or pelvis. 2. Fluid containing nondilated small and large bowel loops. Enteritis/diarrhea not excluded. No other acute or inflammatory  process identified in the abdomen or pelvis. Normal appendix. 3. Chronic right femoral head AVN. Aortic Atherosclerosis (ICD10-I70.0). Electronically signed by: Helayne Hurst MD 03/19/2024 05:21 AM EST RP Workstation: [id number]   CT T-SPINE NO CHARGE Result Date: 03/19/2024 EXAM: CT THORACIC SPINE WITHOUT CONTRAST 03/19/2024 04:36:37 AM TECHNIQUE: CT of the thoracic spine was performed without the administration of intravenous contrast. Multiplanar reformatted images are provided for review. Automated exposure control, iterative reconstruction, and/or weight based adjustment of the mA/kV was utilized to reduce the radiation dose to as low as reasonably achievable. COMPARISON: CT chest, abdomen, and pelvis reported separately today. Previous thoracic spine CT 07/24/2023. CT of the cervical spine reported separately today. CLINICAL HISTORY: 69 year old female with fall from standing, dizziness, and pain. FINDINGS: Small ribs at T12 on the prior CT with otherwise normal thoracic segmentation. BONES AND ALIGNMENT: T12 compression fracture (sagittal image 37) is new from 07/24/2023, with 50% loss of vertebral body height. No significant retropulsion. T12 visible posterior elements appear intact and aligned. T5 mild inferior endplate compression asymmetric to the left is also new, but appears sclerotic. Minimal loss of height. No retropulsion. T5 posterior elements appear intact and aligned. Mildly exaggerated thoracic kyphosis has not significantly changed. Other thoracic vertebral height is maintained and stable. Chronic posterior rib fractures on the left at the 2nd and 5th ribs and on the right at the 9th through 11th ribs. Right 10th transverse process fracture appears new since last year but sclerotic and chronic (series 5 image 61 and series 3 image 108). No suspicious bone lesion. Normal alignment except for the T12 compression fracture and T5 endplate compression, and mildly exaggerated thoracic kyphosis. SOFT  TISSUES: Thoracic paraspinal soft tissues are within normal limits. Chest and abdomen are reported separately today. DEGENERATIVE CHANGES: Mostly age appropriate thoracic spine degeneration. No high grade thoracic spinal stenosis by CT. IMPRESSION: 1. Age indeterminate T12 compression fracture (50% loss of height, no retropulsion or other complicating features) is new since 07/24/2023. If specific therapy such as vertebroplasty is desired, MRI without contrast or a nuclear medicine whole-body bone scan would best determine acuity and candidacy for intervention. 2. Mild T5  compression fracture and right T10 transverse process fracture are also new but appear chronic by CT. Superimposed chronic bilateral posterior rib fractures. 3. CT chest, abdomen, and pelvis reported separately today. Electronically signed by: Helayne Hurst MD 03/19/2024 05:05 AM EST RP Workstation: [id number]   CT CERVICAL SPINE WO CONTRAST Result Date: 03/19/2024 EXAM: CT CERVICAL SPINE WITHOUT CONTRAST 03/19/2024 04:36:37 AM TECHNIQUE: CT of the cervical spine was performed without the administration of intravenous contrast. Multiplanar reformatted images are provided for review. Automated exposure control, iterative reconstruction, and/or weight based adjustment of the mA/kV was utilized to reduce the radiation dose to as low as reasonably achievable. COMPARISON: CT head reported separately today. Cervical spine CT 09/07/2023. Prior thyroid  ultrasound 02/11/2021. CLINICAL HISTORY: 69 year old female. Neck trauma, fall from standing, dizziness, pain. FINDINGS: BONES AND ALIGNMENT: Stable cervical lordosis. Underlying chronic dextroconvex cervical scoliosis. C2 spina bifida occulta, normal variant. No acute fracture or traumatic malalignment. DEGENERATIVE CHANGES: Posterior element ankylosis from the C2 through the C5 level. Stable superimposed degenerative mild anterolisthesis of C5 on C6, with chronic severe left side facet arthropathy at that  level. Chronic severe disc and endplate degeneration at C6-C7, faint vacuum disc there now. Chronic posterior occipital condyle-C1 advanced degeneration on the left side. Capacious CT appearance of the cervical spinal canal. SOFT TISSUES: No prevertebral soft tissue swelling. Chronic homogeneous thyroid  enlargement, evaluated on prior imaging. Calcified cervical carotid artery atherosclerosis in the bilateral neck. IMPRESSION: 1. No acute traumatic injury identified in the cervical spine. 2. Advanced chronic cervical spine degeneration with widespread posterior element ankylosis superimposed on scoliosis, mild spondylolisthesis Electronically signed by: Helayne Hurst MD 03/19/2024 04:55 AM EST RP Workstation: [id number]   CT HEAD WO CONTRAST ( ) Result Date: 03/19/2024 EXAM: CT HEAD WITHOUT CONTRAST 03/19/2024 04:36:37 AM TECHNIQUE: CT of the head was performed without the administration of intravenous contrast. Automated exposure control, iterative reconstruction, and/or weight based adjustment of the mA/kV was utilized to reduce the radiation dose to as low as reasonably achievable. COMPARISON: Brain MRI 06/23/2022, CT head 08/08/2023. CLINICAL HISTORY: 69 year old female. Fall from standing, dizziness, pain. FINDINGS: BRAIN AND VENTRICLES: No acute hemorrhage. No evidence of acute infarct. No hydrocephalus. No extra-axial collection. No mass effect or midline shift. Brain volume not significantly changed since 2024. Chronic mostly periventricular but intense white matter changes also appears stable from previous MRI appearance. Chronic dystrophic calcification in the medial left cerebellum on series 3 image 6 has not significantly changed since 2016. Punctate calcified atherosclerosis at the skull base. No suspicious intracranial vascular hyperdensity. ORBITS: No acute orbital injury identified. SINUSES: Paranasal sinuses, tympanic cavities and mastoids remain well aerated. SOFT TISSUES AND SKULL: No acute  scalp soft tissue injury identified. No skull fracture. IMPRESSION: 1. No acute intracranial abnormality or acute traumatic injury identified. Port 2. Stable chronic white matter disease, small chronic dystrophic calcification in the left cerebellum. Electronically signed by: Helayne Hurst MD 03/19/2024 04:51 AM EST RP Workstation: [id number]    EKG: Independently reviewed. Normal sinus rhythm.   Assessment/Plan Primary Problem: T12 compression fracture  -continue oxycodone  q 6 hours PRN for pain  -order TSLO   -begin inpatient PT  Other Problems: Osteoporosis  -hold fosamax   -continue calcium  carbonate-vitamin D  PO daily   -repeat bone scan as scheduled  Anxiety and depression  -continue buspirone  15mg  PO tid  -hold prn propranolol   -continue trazodone   -continue hydroxyzine   -hold bupropion   Hypertension  -continue carvedilol  6.25 mg PO bid  Hyperlipidemia  -follow up with primary care to discuss  starting a statin  Alcohol  use disorder  -complicates care  -continue IV fluids  -prn diazepam  for withdrawal symptoms  Peripheral neuropathy  -hold gabapentin   -hold meloxicam   DVT prophylaxis: None Code Status:   Full Family Communication: No family present at bedside Disposition Plan:   Plan to discharge back to home is pain management is achieved and PT is tolerated   Admission status:  Inpatient for observation   Severity of Illness: The appropriate patient status for this patient is OBSERVATION. Observation status is judged to be reasonable and necessary in order to provide the required intensity of service to ensure the patient's safety. The patient's presenting symptoms, physical exam findings, and initial radiographic and laboratory data in the context of their medical condition is felt to place them at decreased risk for further clinical deterioration. Furthermore, it is anticipated that the patient will be medically stable for discharge from the hospital within 2  midnights of admission.     Marcelline Greet PA-S [employer]  How to contact the [facility] Attending or Consulting provider 7A - 7P or covering provider during after hours 7P -7A, for this patient?   Check the care team in [facility] and look for a) attending/consulting [facility] provider listed and b) the [facility] team listed Log into [email or web address] and use [place]'s universal password to access. If you do not have the password, please contact the hospital operator. Locate the [facility] provider you are looking for under [facility] and page to a number that you can be directly reached. If you still have difficulty reaching the provider, please page the [facility] (Director on Call) for the Hospitalists listed on amion for assistance.  03/19/2024, 8:07 AM      [1]  Allergies Allergen Reactions   Doxycycline Swelling    Mouth swelling and sores     Tetracycline Hcl Swelling and Other (See Comments)    Mouth swelling and sores   Codeine Itching   "

## 2024-03-19 NOTE — Addendum Note (Signed)
 Addendum  created 03/19/24 1209 by Delayne Olam BIRCH, CRNA   Intraprocedure Meds edited

## 2024-03-19 NOTE — ED Notes (Signed)
 Patient transported to CT

## 2024-03-20 DIAGNOSIS — S22000A Wedge compression fracture of unspecified thoracic vertebra, initial encounter for closed fracture: Secondary | ICD-10-CM

## 2024-03-20 LAB — MAGNESIUM: Magnesium: 1.9 mg/dL (ref 1.7–2.4)

## 2024-03-20 LAB — CBC
HCT: 34 % — ABNORMAL LOW (ref 36.0–46.0)
Hemoglobin: 11.4 g/dL — ABNORMAL LOW (ref 12.0–15.0)
MCH: 30 pg (ref 26.0–34.0)
MCHC: 33.5 g/dL (ref 30.0–36.0)
MCV: 89.5 fL (ref 80.0–100.0)
Platelets: 271 K/uL (ref 150–400)
RBC: 3.8 MIL/uL — ABNORMAL LOW (ref 3.87–5.11)
RDW: 14.3 % (ref 11.5–15.5)
WBC: 8 K/uL (ref 4.0–10.5)
nRBC: 0 % (ref 0.0–0.2)

## 2024-03-20 LAB — BASIC METABOLIC PANEL WITH GFR
Anion gap: 17 — ABNORMAL HIGH (ref 5–15)
BUN: 6 mg/dL — ABNORMAL LOW (ref 8–23)
CO2: 19 mmol/L — ABNORMAL LOW (ref 22–32)
Calcium: 8.3 mg/dL — ABNORMAL LOW (ref 8.9–10.3)
Chloride: 101 mmol/L (ref 98–111)
Creatinine, Ser: 0.81 mg/dL (ref 0.44–1.00)
GFR, Estimated: >60 mL/min
Glucose, Bld: 111 mg/dL — ABNORMAL HIGH (ref 70–99)
Potassium: 4.3 mmol/L (ref 3.5–5.1)
Sodium: 138 mmol/L (ref 135–145)

## 2024-03-20 LAB — PHOSPHORUS: Phosphorus: 3.3 mg/dL (ref 2.5–4.6)

## 2024-03-20 LAB — IRON AND TIBC
Iron: 111 ug/dL (ref 28–170)
Saturation Ratios: 32 % — ABNORMAL HIGH (ref 10.4–31.8)
TIBC: 346 ug/dL (ref 250–450)
UIBC: 235 ug/dL

## 2024-03-20 LAB — FERRITIN: Ferritin: 65 ng/mL (ref 11–307)

## 2024-03-20 MED ORDER — LACTATED RINGERS IV SOLN: INTRAVENOUS | Status: AC

## 2024-03-20 MED ORDER — AMLODIPINE BESYLATE 5 MG PO TABS
2.5000 mg | ORAL_TABLET | Freq: Every day | ORAL | Status: DC
  Administered 2024-03-20 – 2024-03-22 (×3): 2.5 mg via ORAL
  Filled 2024-03-20 (×3): qty 1

## 2024-03-20 MED ORDER — SENNOSIDES-DOCUSATE SODIUM 8.6-50 MG PO TABS
1.0000 | ORAL_TABLET | Freq: Two times a day (BID) | ORAL | Status: DC
  Administered 2024-03-20 – 2024-03-22 (×5): 1 via ORAL
  Filled 2024-03-20 (×5): qty 1

## 2024-03-20 NOTE — Care Management Obs Status (Signed)
 MEDICARE OBSERVATION STATUS NOTIFICATION   Patient Details  Name: Terri Ayala MRN: [id number] Date of Birth: April 16, 1955   Medicare Observation Status Notification Given:    Patient could not signec but understood and a copy was given.    Prithvi Kooi 03/20/2024, 2:40 PM

## 2024-03-20 NOTE — Evaluation (Signed)
 Physical Therapy Evaluation Patient Details Name: Terri Ayala MRN: [id number] DOB: 1955-05-29 Today's Date: 03/20/2024  History of Present Illness  Pt is 69 yo presenting to [facility] on 1/20 due to a ground level fall where pt fell backwards and hit her head.  Found T12 compression fracture. Neurosurgery recommends conservative measures with TSO.PMH: HTN, peripheral neuropathy, anxiety/depression, EtOH abuse, recent R thumb metacarpal shaft fracture with closed reduction/percutaneous pinning.  Clinical Impression  Pt is currently presenting at Min A +2 for bed mobility, Mod A for sit to stand and CGA to Min A for short distance gait. Pt has significantly difficulty with sequencing for safety with AD during ambulation to get to recliner and requires heavy multi modal cueing in order to maintain WB precautions on the R hand and decrease risk for falls with use of AD sequencing. Pt may progress to home with 24/7 supervision. Due to pt current functional status, home set up and available assistance at home recommending skilled physical therapy services on discharge from acute care hospital setting in order to address strength, balance and functional mobility to decrease risk for falls, injury, immobility, skin break down and re-hospitalization.          If plan is discharge home, recommend the following: A little help with walking and/or transfers;Assistance with cooking/housework;Assist for transportation;Supervision due to cognitive status;Help with stairs or ramp for entrance   Can travel by private vehicle   Yes    Equipment Recommendations None recommended by PT     Functional Status Assessment Patient has had a recent decline in their functional status and demonstrates the ability to make significant improvements in function in a reasonable and predictable amount of time.     Precautions / Restrictions Precautions Precautions: Back;Fall Precaution Booklet Issued: No Recall of  Precautions/Restrictions: Impaired Required Braces or Orthoses: Spinal Brace Spinal Brace: Thoracolumbosacral orthotic;Applied in sitting position Restrictions Weight Bearing Restrictions Per Provider Order: Yes RUE Weight Bearing Per Provider Order: Non weight bearing Other Position/Activity Restrictions: due to previous fracture of the meatcarpal in the R hand with closed reduction and percutaneous pinning.      Mobility  Bed Mobility Overal bed mobility: Needs Assistance Bed Mobility: Rolling, Sidelying to Sit Rolling: Min assist Sidelying to sit: Min assist       General bed mobility comments: Min A with frequent cues for maintaining NWB status on the R hand and with cues for log roll and then pushing trunk to midline. with Min A for  core strength    Transfers Overall transfer level: Needs assistance Equipment used: Rolling walker (2 wheels) Transfers: Sit to/from Stand Sit to Stand: Mod assist, +2 safety/equipment           General transfer comment: Mod A for sit to stand for initial momentum to get into standing from EOB with multi modal cues for hand placement in order to prevent pushing up from the EOB with R hand due to NWB precautions from previous injury.    Ambulation/Gait Ambulation/Gait assistance: Contact guard assist, +2 safety/equipment Gait Distance (Feet): 12 Feet Assistive device: Rolling walker (2 wheels) Gait Pattern/deviations: Step-through pattern, Trunk flexed, Decreased step length - left, Decreased step length - right, Decreased stride length, Decreased weight shift to right Gait velocity: decreased Gait velocity interpretation: <1.31 ft/sec, indicative of household ambulator   General Gait Details: Pt is demonstrating slow gait, no overt LOB, requires heavy cues for sequencing for safety to get to recliner.      Balance Overall balance  assessment: Needs assistance Sitting-balance support: Single extremity supported, Feet supported, Feet  unsupported Sitting balance-Leahy Scale: Fair     Standing balance support: Bilateral upper extremity supported, During functional activity, Reliant on assistive device for balance Standing balance-Leahy Scale: Poor Standing balance comment: CGA for safety       Pertinent Vitals/Pain Pain Assessment Pain Assessment: 0-10 Pain Score: 9  Pain Location: abdomen on the R side Pain Descriptors / Indicators: Discomfort Pain Intervention(s): Monitored during session, Patient requesting pain meds-RN notified, Repositioned, Limited activity within patient's tolerance    Home Living Family/patient expects to be discharged to:: Private residence Living Arrangements: Alone Available Help at Discharge: Family;Personal care attendant Type of Home: House Home Access: Stairs to enter Entrance Stairs-Rails: Doctor, General Practice of Steps: 1   Home Layout: 1/2 bath on main level;Bed/bath upstairs;Able to live on main level with bedroom/bathroom Home Equipment: Rolling Walker (2 wheels);Cane - single point;BSC/3in1;Shower seat Additional Comments: Aides provides assistance    Prior Function Prior Level of Function : Needs assist             Mobility Comments: Pt states she was using a cane when she went to store and walker at home despite NWB precautions on the R hand. ADLs Comments: PCA daily that assists with iADL, meals, community mobility and assists with her pet.  Patient stating aide does not leave her alone to shower, assists with transportation.     Extremity/Trunk Assessment   Upper Extremity Assessment Upper Extremity Assessment: Defer to OT evaluation    Lower Extremity Assessment Lower Extremity Assessment: Generalized weakness    Cervical / Trunk Assessment Cervical / Trunk Assessment: Kyphotic  Communication   Communication Communication: Impaired Factors Affecting Communication: Hearing impaired    Cognition Arousal: Alert Behavior During Therapy:  Anxious   PT - Cognitive impairments: Sequencing, Problem solving, Attention     Following commands: Impaired Following commands impaired: Follows one step commands with increased time, Follows multi-step commands inconsistently     Cueing Cueing Techniques: Verbal cues, Tactile cues, Visual cues     General Comments General comments (skin integrity, edema, etc.): Pt was on 2L O2 via [facility] initially. Pt does not wear O2 at home. Removed during activity. No signs/symptoms of cardiac/respiratory distress. Pt with anxiety intermittently throughout session.        Assessment/Plan    PT Assessment Patient needs continued PT services  PT Problem List Decreased strength;Decreased balance;Pain;Decreased mobility;Decreased activity tolerance;Decreased safety awareness       PT Treatment Interventions DME instruction;Functional mobility training;Balance training;Gait training;Stair training;Therapeutic exercise;Neuromuscular re-education;Patient/family education;Wheelchair mobility training;Therapeutic activities    PT Goals (Current goals can be found in the Care Plan section)  Acute Rehab PT Goals Patient Stated Goal: decrease pain PT Goal Formulation: With patient Time For Goal Achievement: 04/03/24 Potential to Achieve Goals: Fair    Frequency Min 2X/week     Co-evaluation PT/OT/SLP Co-Evaluation/Treatment: Yes Reason for Co-Treatment: Necessary to address cognition/behavior during functional activity;To address functional/ADL transfers PT goals addressed during session: Mobility/safety with mobility;Balance;Proper use of DME OT goals addressed during session: ADL's and self-care;Proper use of Adaptive equipment and DME       AM-PAC PT 6 Clicks Mobility  Outcome Measure Help needed turning from your back to your side while in a flat bed without using bedrails?: A Little Help needed moving from lying on your back to sitting on the side of a flat bed without using bedrails?: A  Little Help needed moving to and from a bed  to a chair (including a wheelchair)?: A Lot Help needed standing up from a chair using your arms (e.g., wheelchair or bedside chair)?: A Lot Help needed to walk in hospital room?: A Little Help needed climbing 3-5 steps with a railing? : A Lot 6 Click Score: 15    End of Session Equipment Utilized During Treatment: Gait belt;Back brace Activity Tolerance: Patient tolerated treatment well;Patient limited by pain Patient left: in chair;with call bell/phone within reach;with chair alarm set;with nursing/sitter in room (tech in room) Nurse Communication: Mobility status;Precautions PT Visit Diagnosis: Unsteadiness on feet (R26.81);Other abnormalities of gait and mobility (R26.89);Muscle weakness (generalized) (M62.81);Pain Pain - Right/Left: Right Pain - part of body:  (back)    Time: 8951-8883 PT Time Calculation (min) (ACUTE ONLY): 28 min   Charges:   PT Evaluation $PT Eval Low Complexity: 1 Low   PT General Charges $$ ACUTE PT VISIT: 1 Visit        Dorothyann Maier, DPT, CLT  Acute Rehabilitation Services Office: [phone] (Secure chat preferred)   Dorothyann VEAR Maier 03/20/2024, 11:31 AM

## 2024-03-20 NOTE — Progress Notes (Signed)
 " PROGRESS NOTE    Terri Ayala  FMW:[id number] DOB: [birth year] DOA: 03/19/2024 PCP: Gayle Saddie FALCON, PA-C   Brief Narrative: 69 year old with past medical history significant for hypertension, peripheral neuropathy, anxiety/depression, EtOH, recent right thumb metacarpal shaft fracture requiring closed reduction/percutaneous pinning who presents to the ED complaining of severe low back pain.  Patient was in her garage when she reached out to grab an object subsequently got dizzy, fell backward and hit her car with her lower back.  Since then she has had excruciating pain.  Evaluation in the ED consistent with T12 compression fracture.  ED MD discussed with neurosurgery who recommended conservative measure including mobilization, pain control and TSL O brace.   Assessment & Plan:   Principal Problem:   Compression fracture of body of thoracic vertebra (HCC) Active Problems:   Hearing loss   Uncomplicated alcohol  dependence (HCC)   Major depressive disorder, recurrent episode, moderate (HCC)   Benign essential HTN   1-T12 compression fracture: - After a fall, history of osteoporosis on bisphosphonate - EDMD discussed with neurosurgery recommendation are for pain control, PT OT evaluation, T SLO brace. -Continue with Pain management -Senna-docusate to help prevent constipation.   Frequent falls: - Patient had falls with rib fractures in the past, recently right pelvic fracture requiring pinning on 1/16. - Presented after a fall now with T12 fracture. - Suspect  related to alcohol  use PT eval. Refuse SNF  EtOH use: - Patient reports last alcoholic drink was 7 days ago.  She was tremulous on admission. - Continue CIWA  Hypertension: - Continue Coreg   Anxiety/depression: -Continue BuSpar , trazodone , hydroxyzine  and Wellbutrin   Peripheral neuropathy: - Continue Neurontin   Recent right thumb fracture status post closed reduction percutaneous pinning on 1/16 - Per operative note  nonweightbearing to the right upper extremity, spica splint follow-up in office in 2 weeks   Estimated body mass index is 26.21 kg/m as calculated from the following:   Height as of 03/15/24: 5' 2 (1.575 m).   Weight as of 03/15/24: 65 kg.   DVT prophylaxis: Lovenox  Code Status: Full code Family Communication: care discussed with patient.  Disposition Plan:  Status is: Observation The patient remains OBS appropriate and will d/c before 2 midnights.    Consultants:  ED MD discussed case with neurosurgery   Procedures:    Antimicrobials:    Subjective: She is complaining of abdominal pain, back pain, ribs pain.    Objective: Vitals:   03/19/24 1618 03/19/24 1937 03/20/24 0023 03/20/24 0701  BP: 129/64 (!) 148/80 130/71 (!) 137/96  Pulse: 90 84 89 92  Resp:  16 17 17   Temp: 98.1 F (36.7 C) 97.9 F (36.6 C) 98.1 F (36.7 C) 98 F (36.7 C)  TempSrc:      SpO2: 96% 96% 94%     Intake/Output Summary (Last 24 hours) at 03/20/2024 9276 Last data filed at 03/19/2024 0920 Gross per 24 hour  Intake 121.46 ml  Output --  Net 121.46 ml   There were no vitals filed for this visit.  Examination:  General exam: Appears calm and comfortable  Respiratory system: Clear to auscultation. Respiratory effort normal. Cardiovascular system: S1 & S2 heard, RRR. No JVD, murmurs, rubs, gallops or clicks. No pedal edema. Gastrointestinal system: Abdomen is nondistended, soft and nontender. No organomegaly or masses felt. Normal bowel sounds heard. Central nervous system: Alert and oriented. No focal neurological deficits. Extremities: Symmetric 5 x 5 power.    Data Reviewed: I have personally reviewed  following labs and imaging studies  CBC: Recent Labs  Lab 03/19/24 0326 03/19/24 0330 03/20/24 0507  WBC 8.1  --  8.0  NEUTROABS 5.4  --   --   HGB 12.0 12.9 11.4*  HCT 37.5 38.0 34.0*  MCV 93.1  --  89.5  PLT 338  --  271   Basic Metabolic Panel: Recent Labs  Lab  03/19/24 0326 03/19/24 0330 03/20/24 0507  NA 139 142 138  K 4.0 4.1 4.3  CL 104 103 101  CO2 24  --  19*  GLUCOSE 73 74 111*  BUN 12 14 6*  CREATININE 0.88 0.90 0.81  CALCIUM  9.2  --  8.3*  MG  --   --  1.9  PHOS  --   --  3.3   GFR: Estimated Creatinine Clearance: 58.1 mL/min (by C-G formula based on SCr of 0.81 mg/dL). Liver Function Tests: Recent Labs  Lab 03/19/24 0326  AST 30  ALT 9  ALKPHOS 65  BILITOT 0.3  PROT 7.1  ALBUMIN 4.3   No results for input(s): LIPASE, AMYLASE in the last 168 hours. No results for input(s): AMMONIA in the last 168 hours. Coagulation Profile: No results for input(s): INR, PROTIME in the last 168 hours. Cardiac Enzymes: No results for input(s): CKTOTAL, CKMB, CKMBINDEX, TROPONINI in the last 168 hours. BNP (last 3 results) No results for input(s): PROBNP in the last 8760 hours. HbA1C: No results for input(s): HGBA1C in the last 72 hours. CBG: No results for input(s): GLUCAP in the last 168 hours. Lipid Profile: No results for input(s): CHOL, HDL, LDLCALC, TRIG, CHOLHDL, LDLDIRECT in the last 72 hours. Thyroid  Function Tests: No results for input(s): TSH, T4TOTAL, FREET4, T3FREE, THYROIDAB in the last 72 hours. Anemia Panel: No results for input(s): VITAMINB12, FOLATE, FERRITIN, TIBC, IRON, RETICCTPCT in the last 72 hours. Sepsis Labs: No results for input(s): PROCALCITON, LATICACIDVEN in the last 168 hours.  No results found for this or any previous visit (from the past 240 hours).       Radiology Studies: CT L-SPINE NO CHARGE Result Date: 03/19/2024 EXAM: CT OF THE LUMBAR SPINE WITHOUT CONTRAST 03/19/2024 04:36:37 AM TECHNIQUE: CT of the lumbar spine was performed without the administration of intravenous contrast. Multiplanar reformatted images are provided for review. Automated exposure control, iterative reconstruction, and/or weight based adjustment of the mA/kV  was utilized to reduce the radiation dose to as low as reasonably achievable. COMPARISON: CT chest, abdomen, and pelvis, thoracic spine CT reported separately today. CT abdomen and pelvis 07/24/2023. CLINICAL HISTORY: 69 year old female with fall from standing and pain. FINDINGS: Normal lumbar segmentation, concordant with the thoracic CT today. BONES AND ALIGNMENT: On these images, the T12 compression fracture appears unhealed and is likely to be acute or subacute (coronal image 34). See additional details on thoracic CT reported separately today. Normal lumbar vertebral body heights. Stable lumbar lordosis since last year. No acute fracture or suspicious bone lesion. Normal alignment. SOFT TISSUES: Lumbar paraspinal soft tissues within normal limits. DEGENERATIVE CHANGES: Generally mild for age lumbar spine degeneration. Chronic disc and endplate degeneration at L5-S1. Capacious underlying lumbar spinal canal. IMPRESSION: 1. On these images, the T12 compression fracture appears unhealed and likely acute or subacute. See additional details on thoracic CT reported separately today. 2. No acute traumatic injury identified in the lumbar spine. 3. CT chest, abdomen, and pelvis today reported separately. Electronically signed by: Helayne Hurst MD 03/19/2024 05:26 AM EST RP Workstation: [id number]   CT CHEST ABDOMEN PELVIS  W CONTRAST Result Date: 03/19/2024 EXAM: CT CHEST, ABDOMEN AND PELVIS WITH CONTRAST 03/19/2024 04:36:37 AM TECHNIQUE: CT of the chest, abdomen and pelvis was performed with the administration of 75 mL of iohexol  (OMNIPAQUE ) 350 MG/ML injection. Multiplanar reformatted images are provided for review. Automated exposure control, iterative reconstruction, and/or weight based adjustment of the mA/kV was utilized to reduce the radiation dose to as low as reasonably achievable. COMPARISON: CT chest, abdomen, and pelvis 07/24/2023. CT thoracic and lumbar spine, reported separately today. Thyroid  ultrasound  02/11/2021. CLINICAL HISTORY: 69 year old female. Polytrauma, blunt. Fall from standing. Dizziness. Pain. FINDINGS: CHEST: MEDIASTINUM AND LYMPH NODES: Heart and pericardium are unremarkable. The central airways are clear. No mediastinal, hilar or axillary lymphadenopathy. Heterogeneous thyroid  with chronic enlargement of the right lobe appears stable from prior CT and was previously evaluated on dedicated thyroid  ultrasound. Size is stable at the upper limits of normal. LUNGS AND PLEURA: Larger lung volumes. Linear peribronchial opacity in the lingula with enhancement more resembles atelectasis than infection. Mild superimposed bilateral lung base scarring, including in the anterior basal segment of the left lower lobe and in the lateral segment of the right middle lobe. No convincing pulmonary contusion. No pleural effusion. No pneumothorax. ABDOMEN AND PELVIS: LIVER: Unremarkable. GALLBLADDER AND BILE DUCTS: Chronic cholecystectomy. No biliary ductal dilatation. SPLEEN: No acute abnormality. PANCREAS: No acute abnormality. ADRENAL GLANDS: No acute abnormality. KIDNEYS, URETERS AND BLADDER: Renal enhancement and contrast excretion are symmetric and normal. Normal ureters and early contrast excretion in the urinary bladder on the delayed images. No stones in the kidneys or ureters. No hydronephrosis. No perinephric or periureteral stranding. GI AND BOWEL: Stomach demonstrates no acute abnormality. Gastric antrum and pylorus appear thickened (series 7 image 42) but there is no convincing acute gastroduodenal or other bowel loop inflammation. Redundant large bowel especially the sigmoid colon in the pelvis. Distal large bowel retained stool is mild to moderate. More upstream large bowel fluid is noted through the transverse colon. Fluid containing small bowel loops. No dilated large or small bowel. Cecum is on a lax mesentery. Terminal ileum is decompressed. Normal appendix on series 3 image 76. REPRODUCTIVE ORGANS:  No acute abnormality. PERITONEUM AND RETROPERITONEUM: No ascites. No free air. VASCULATURE: Aorta is normal in caliber. Moderate calcified aortoiliac atherosclerosis in the abdomen and pelvis. Major arterial structures and the portal venous system are enhancing and patent. Mild for age calcified atherosclerosis in the chest. ABDOMINAL AND PELVIS LYMPH NODES: No lymphadenopathy. BONES AND SOFT TISSUES: Numerous nonacute bilateral rib fractures, including chronic or healing fractures of the left lateral 2nd through 7th ribs, and superimposed on the left posterior 4th through 9th rib fractures. Similar chronic posterolateral right 5th through 10th rib fractures. No acute rib fracture identified. Sacrum, SI joints, pelvis and proximal femurs are stable and intact. However, there is chronic avascular necrosis of the right femoral head. No superficial soft tissue injury. IMPRESSION: 1. Numerous nonacute bilateral rib fractures. See also Thoracic spine CT reported separately today. No other acute traumatic injury identified in the chest, abdomen, or pelvis. 2. Fluid containing nondilated small and large bowel loops. Enteritis/diarrhea not excluded. No other acute or inflammatory process identified in the abdomen or pelvis. Normal appendix. 3. Chronic right femoral head AVN. Aortic Atherosclerosis (ICD10-I70.0). Electronically signed by: Helayne Hurst MD 03/19/2024 05:21 AM EST RP Workstation: [id number]   CT T-SPINE NO CHARGE Result Date: 03/19/2024 EXAM: CT THORACIC SPINE WITHOUT CONTRAST 03/19/2024 04:36:37 AM TECHNIQUE: CT of the thoracic spine was performed without the administration of  intravenous contrast. Multiplanar reformatted images are provided for review. Automated exposure control, iterative reconstruction, and/or weight based adjustment of the mA/kV was utilized to reduce the radiation dose to as low as reasonably achievable. COMPARISON: CT chest, abdomen, and pelvis reported separately today. Previous  thoracic spine CT 07/24/2023. CT of the cervical spine reported separately today. CLINICAL HISTORY: 69 year old female with fall from standing, dizziness, and pain. FINDINGS: Small ribs at T12 on the prior CT with otherwise normal thoracic segmentation. BONES AND ALIGNMENT: T12 compression fracture (sagittal image 37) is new from 07/24/2023, with 50% loss of vertebral body height. No significant retropulsion. T12 visible posterior elements appear intact and aligned. T5 mild inferior endplate compression asymmetric to the left is also new, but appears sclerotic. Minimal loss of height. No retropulsion. T5 posterior elements appear intact and aligned. Mildly exaggerated thoracic kyphosis has not significantly changed. Other thoracic vertebral height is maintained and stable. Chronic posterior rib fractures on the left at the 2nd and 5th ribs and on the right at the 9th through 11th ribs. Right 10th transverse process fracture appears new since last year but sclerotic and chronic (series 5 image 61 and series 3 image 108). No suspicious bone lesion. Normal alignment except for the T12 compression fracture and T5 endplate compression, and mildly exaggerated thoracic kyphosis. SOFT TISSUES: Thoracic paraspinal soft tissues are within normal limits. Chest and abdomen are reported separately today. DEGENERATIVE CHANGES: Mostly age appropriate thoracic spine degeneration. No high grade thoracic spinal stenosis by CT. IMPRESSION: 1. Age indeterminate T12 compression fracture (50% loss of height, no retropulsion or other complicating features) is new since 07/24/2023. If specific therapy such as vertebroplasty is desired, MRI without contrast or a nuclear medicine whole-body bone scan would best determine acuity and candidacy for intervention. 2. Mild T5 compression fracture and right T10 transverse process fracture are also new but appear chronic by CT. Superimposed chronic bilateral posterior rib fractures. 3. CT chest,  abdomen, and pelvis reported separately today. Electronically signed by: Helayne Hurst MD 03/19/2024 05:05 AM EST RP Workstation: [id number]   CT CERVICAL SPINE WO CONTRAST Result Date: 03/19/2024 EXAM: CT CERVICAL SPINE WITHOUT CONTRAST 03/19/2024 04:36:37 AM TECHNIQUE: CT of the cervical spine was performed without the administration of intravenous contrast. Multiplanar reformatted images are provided for review. Automated exposure control, iterative reconstruction, and/or weight based adjustment of the mA/kV was utilized to reduce the radiation dose to as low as reasonably achievable. COMPARISON: CT head reported separately today. Cervical spine CT 09/07/2023. Prior thyroid  ultrasound 02/11/2021. CLINICAL HISTORY: 69 year old female. Neck trauma, fall from standing, dizziness, pain. FINDINGS: BONES AND ALIGNMENT: Stable cervical lordosis. Underlying chronic dextroconvex cervical scoliosis. C2 spina bifida occulta, normal variant. No acute fracture or traumatic malalignment. DEGENERATIVE CHANGES: Posterior element ankylosis from the C2 through the C5 level. Stable superimposed degenerative mild anterolisthesis of C5 on C6, with chronic severe left side facet arthropathy at that level. Chronic severe disc and endplate degeneration at C6-C7, faint vacuum disc there now. Chronic posterior occipital condyle-C1 advanced degeneration on the left side. Capacious CT appearance of the cervical spinal canal. SOFT TISSUES: No prevertebral soft tissue swelling. Chronic homogeneous thyroid  enlargement, evaluated on prior imaging. Calcified cervical carotid artery atherosclerosis in the bilateral neck. IMPRESSION: 1. No acute traumatic injury identified in the cervical spine. 2. Advanced chronic cervical spine degeneration with widespread posterior element ankylosis superimposed on scoliosis, mild spondylolisthesis Electronically signed by: Helayne Hurst MD 03/19/2024 04:55 AM EST RP Workstation: [id number]   CT HEAD WO  CONTRAST ( )  Result Date: 03/19/2024 EXAM: CT HEAD WITHOUT CONTRAST 03/19/2024 04:36:37 AM TECHNIQUE: CT of the head was performed without the administration of intravenous contrast. Automated exposure control, iterative reconstruction, and/or weight based adjustment of the mA/kV was utilized to reduce the radiation dose to as low as reasonably achievable. COMPARISON: Brain MRI 06/23/2022, CT head 08/08/2023. CLINICAL HISTORY: 69 year old female. Fall from standing, dizziness, pain. FINDINGS: BRAIN AND VENTRICLES: No acute hemorrhage. No evidence of acute infarct. No hydrocephalus. No extra-axial collection. No mass effect or midline shift. Brain volume not significantly changed since 2024. Chronic mostly periventricular but intense white matter changes also appears stable from previous MRI appearance. Chronic dystrophic calcification in the medial left cerebellum on series 3 image 6 has not significantly changed since 2016. Punctate calcified atherosclerosis at the skull base. No suspicious intracranial vascular hyperdensity. ORBITS: No acute orbital injury identified. SINUSES: Paranasal sinuses, tympanic cavities and mastoids remain well aerated. SOFT TISSUES AND SKULL: No acute scalp soft tissue injury identified. No skull fracture. IMPRESSION: 1. No acute intracranial abnormality or acute traumatic injury identified. Port 2. Stable chronic white matter disease, small chronic dystrophic calcification in the left cerebellum. Electronically signed by: Helayne Hurst MD 03/19/2024 04:51 AM EST RP Workstation: [id number]        Scheduled Meds:  buPROPion   150 mg Oral BH-q7a   busPIRone   15 mg Oral TID   carvedilol   6.25 mg Oral BID WC   enoxaparin  (LOVENOX ) injection  40 mg Subcutaneous Q24H   folic acid   1 mg Oral Daily   hydrOXYzine   25 mg Oral TID   multivitamin with minerals  1 tablet Oral Daily   pantoprazole   40 mg Oral BID   polyethylene glycol  17 g Oral Daily   senna  1 tablet Oral BID    thiamine   100 mg Oral Daily   Or   thiamine   100 mg Intravenous Daily   traZODone   100 mg Oral QHS   Continuous Infusions:   LOS: 0 days    Time spent: 35 Minutes    Tucker Minter A Consuelo Thayne, MD [facility]   If 7PM-7AM, please contact night-coverage [email or web address]  03/20/2024, 7:23 AM   "

## 2024-03-20 NOTE — Plan of Care (Signed)
  Problem: Clinical Measurements: Goal: Will remain free from infection Outcome: Progressing   Problem: Activity: Goal: Risk for activity intolerance will decrease Outcome: Progressing   Problem: Nutrition: Goal: Adequate nutrition will be maintained Outcome: Progressing   Problem: Pain Managment: Goal: General experience of comfort will improve and/or be controlled Outcome: Progressing

## 2024-03-20 NOTE — Evaluation (Signed)
 Occupational Therapy Evaluation Patient Details Name: Terri Ayala MRN: [id number] DOB: 08/29/1955 Today's Date: 03/20/2024   History of Present Illness   Pt is 69 yo presenting to [facility] on 1/20 due to a ground level fall where pt fell backwards and hit her head. Found T12 compression fracture. Neurosurgery recommends conservative measures with TLSO. PMH: HTN, peripheral neuropathy, anxiety/depression, EtOH abuse, recent R thumb metacarpal shaft fracture with closed reduction/percutaneous pinning.     Clinical Impressions At baseline, pt performs ADLs Independent to Supervision, receives assistance for IADLs, and performs functional mobility Mod I with a SPC or RW. However, pt with a history of falls, including fall leading to this admission and recent fall with pt sustaining R hand fracture with subsequent surgery. Since R hand fracture, pt reports she has been receiving assistance for ADLs. Pt now presents with decreased activity tolerance, generalized UE weakness, decreased knowledge of precautions, impaired cognition (uncertain of baseline), pain affecting functional level, decreased balance, and decreased safety and independence with functional tasks. Pt currently demonstrating ability to largely complete ADLs with Set up to Max assist +2, bed mobility with Min assist, and functional mobility transfers with a RW with Contact guard to Mod assist +2, all with TLSO brace donned. Pt requires frequent cues to adhere to precautions and for sequencing, safety, and compensatory strategies. VSS on RA. Pt will benefit from acute OT services to address deficits and increase safety and independence with functional tasks. Post acute discharge, pt will benefit from intensive inpatient skilled rehab services < 3 hours per day to maximize rehab potential, decrease risk of falls, and decrease risk of rehospitalization.      If plan is discharge home, recommend the following:   Two people to help with walking  and/or transfers;Two people to help with bathing/dressing/bathroom;Assistance with cooking/housework;Direct supervision/assist for medications management;Direct supervision/assist for financial management;Assist for transportation;Help with stairs or ramp for entrance;Supervision due to cognitive status     Functional Status Assessment   Patient has had a recent decline in their functional status and demonstrates the ability to make significant improvements in function in a reasonable and predictable amount of time.     Equipment Recommendations   None recommended by OT (Pt already has needed equipment)     Recommendations for Other Services         Precautions/Restrictions   Precautions Precautions: Back;Fall Precaution Booklet Issued: No Recall of Precautions/Restrictions: Impaired Required Braces or Orthoses: Spinal Brace;Splint/Cast Spinal Brace: Thoracolumbosacral orthotic;Applied in sitting position Splint/Cast: R wrist and hand due to recent R thumb metacarpal shaft fracture with closed reduction/percutaneous pinning Splint/Cast - Date Prophylactic Dressing Applied (if applicable): 03/15/24 Restrictions Weight Bearing Restrictions Per Provider Order: Yes RUE Weight Bearing Per Provider Order: Non weight bearing Other Position/Activity Restrictions: due to previous fracture of the meatcarpal in the R hand with closed reduction and percutaneous pinning.     Mobility Bed Mobility Overal bed mobility: Needs Assistance Bed Mobility: Rolling, Sidelying to Sit Rolling: Min assist Sidelying to sit: Min assist       General bed mobility comments: Min A with frequent cues for maintaining NWB status on the R hand and with cues for log roll and then pushing trunk to midline. with Min A for  core strength    Transfers Overall transfer level: Needs assistance Equipment used: Rolling walker (2 wheels) Transfers: Sit to/from Stand, Bed to chair/wheelchair/BSC Sit to  Stand: Mod assist, +2 safety/equipment     Step pivot transfers: Contact guard assist, +2 safety/equipment  General transfer comment: Mod A for sit to stand for initial momentum to get into standing from EOB with multi modal cues for hand placement in order to prevent pushing up from the EOB with R hand due to NWB precautions from previous injury.      Balance Overall balance assessment: Needs assistance Sitting-balance support: Single extremity supported, Feet supported, Feet unsupported Sitting balance-Leahy Scale: Fair Sitting balance - Comments: sitting EOB and in recliner   Standing balance support: Bilateral upper extremity supported, During functional activity, Reliant on assistive device for balance, Single extremity supported Standing balance-Leahy Scale: Poor Standing balance comment: CGA for safety                           ADL either performed or assessed with clinical judgement   ADL Overall ADL's : Needs assistance/impaired Eating/Feeding: Set up;Minimal assistance;Sitting (assist with cutting food) Eating/Feeding Details (indicate cue type and reason): using non-dominant L hand Grooming: Contact guard assist;Minimal assistance;Sitting;Cueing for compensatory techniques;Cueing for sequencing;Cueing for safety;Cueing for UE precautions Grooming Details (indicate cue type and reason): using non-dominant L hand Upper Body Bathing: Moderate assistance;Sitting;Cueing for compensatory techniques;Cueing for safety;Cueing for sequencing;Cueing for UE precautions   Lower Body Bathing: Maximal assistance;Sit to/from stand;+2 for safety/equipment;Cueing for safety;Cueing for sequencing;Cueing for compensatory techniques;Sitting/lateral leans (cues for R UE NWB)   Upper Body Dressing : Minimal assistance;Moderate assistance;Sitting;Cueing for compensatory techniques;Cueing for safety (cues for R UE NWB)   Lower Body Dressing: Maximal assistance;Sit to/from  stand;Sitting/lateral leans;Cueing for safety;Cueing for sequencing;Cueing for compensatory techniques;+2 for safety/equipment (cues for R UE NWB)   Toilet Transfer: Contact guard assist;Moderate assistance;+2 for safety/equipment;Cueing for safety;Cueing for sequencing;Ambulation;BSC/3in1;Rolling walker (2 wheels) (cues for R UE NWB; [place]oad assist to power up then CGA once in standing with +2 for safety) Toilet Transfer Details (indicate cue type and reason): simulated bed to chair Toileting- Clothing Manipulation and Hygiene: Moderate assistance;+2 for safety/equipment;Sit to/from stand;Cueing for compensatory techniques;Cueing for safety;Cueing for sequencing (cues for R UE NWB)       Functional mobility during ADLs: +2 for safety/equipment;Rolling walker (2 wheels);Cueing for safety;Cueing for sequencing;Contact guard assist;Moderate assistance (cues for R UE NWB; Mod assist to power up then CGA once in standing; +2 for safety)       Vision Patient Visual Report: No change from baseline Additional Comments: Vision [facility] for tasks assessed; not formally screened or evaluated     Perception         Praxis         Pertinent Vitals/Pain Pain Assessment Pain Assessment: 0-10 Pain Score: 9  Pain Location: abdomen on the R side Pain Descriptors / Indicators: Discomfort Pain Intervention(s): Limited activity within patient's tolerance, Monitored during session, Repositioned, Patient requesting pain meds-RN notified, RN gave pain meds during session     Extremity/Trunk Assessment Upper Extremity Assessment Upper Extremity Assessment: Right hand dominant;Generalized weakness;RUE deficits/detail;LUE deficits/detail RUE Deficits / Details: recent R thumb metacarpal shaft fracture with closed reduction/percutaneous pinning on 03/15/24 with pt currently NWB on R UE; AROM of shoulder and elbow WFL; banadaged wrist and hand with bandages clean, dry, and intact at beginning and end of session LUE  Deficits / Details: generalized weakness   Lower Extremity Assessment Lower Extremity Assessment: Defer to PT evaluation   Cervical / Trunk Assessment Cervical / Trunk Assessment: Kyphotic   Communication Communication Communication: Impaired Factors Affecting Communication: Hearing impaired   Cognition Arousal: Alert Behavior During Therapy: Anxious Cognition: Cognition impaired, No  family/caregiver present to determine baseline, History of cognitive impairments (hx of cognitive impairments per chart)     Awareness: Intellectual awareness intact, Online awareness impaired Memory impairment (select all impairments): Short-term memory, Working civil service fast streamer, Engineer, structural memory Attention impairment (select first level of impairment): Selective attention (pt easily internally distracted) Executive functioning impairment (select all impairments): Organization, Reasoning, Sequencing, Problem solving OT - Cognition Comments: Pt AAOx4 and pleasant throughout session with deficits noted above. Pt with poor safety awareness and poor insight into deficits. Pt requiring frequent cues to maintain R UE NWB precaution and not understanding why she needs to wear TLSO even with education.                 Following commands: Impaired Following commands impaired: Follows one step commands with increased time, Follows multi-step commands inconsistently     Cueing  General Comments   Cueing Techniques: Verbal cues;Tactile cues;Visual cues  Pt was on 2L O2 via [place] initially. Pt does not wear O2 at home. Removed during activity. No signs/symptoms of cardiac/respiratory distress. O2 [place] reapplied at end of session. Pt with anxiety intermittently throughout session.   Exercises     Shoulder Instructions      Home Living Family/patient expects to be discharged to:: Private residence Living Arrangements: Alone Available Help at Discharge: Family;Personal care attendant Type of Home:  House Home Access: Stairs to enter [employer] of Steps: 1 Entrance Stairs-Rails: Right;Left Home Layout: 1/2 bath on main level;Bed/bath upstairs;Able to live on main level with bedroom/bathroom     Bathroom Shower/Tub: Tub/shower unit;Sponge bathes at baseline   [employer]: Standard     Home Equipment: Agricultural Consultant (2 wheels);Cane - single point;BSC/3in1;Shower seat   Additional Comments: Aides provides assistance      Prior Functioning/Environment Prior Level of Function : Needs assist             Mobility Comments: Pt states she was using a cane when she went to store and walker at home despite NWB precautions on the R hand. ADLs Comments: PCA daily that assists with IADL, including meals, community mobility, transportation, medication management, and assists with her pet.  Patient stating aide does not leave her alone to shower and assist with other ADLs PRN with pt stating PCA has assisted more since recent R hand metacarpal fx    OT Problem List: Decreased strength;Decreased activity tolerance;Impaired balance (sitting and/or standing);Decreased cognition;Decreased safety awareness;Decreased knowledge of use of DME or AE;Decreased knowledge of precautions;Impaired UE functional use   OT Treatment/Interventions: Self-care/ADL training;DME and/or AE instruction;Therapeutic exercise;Energy conservation;Therapeutic activities;Cognitive remediation/compensation;Patient/family education;Balance training      OT Goals(Current goals can be found in the care plan section)   Acute Rehab OT Goals Patient Stated Goal: to have less pain and return home OT Goal Formulation: With patient Time For Goal Achievement: 04/03/24 Potential to Achieve Goals: Fair ADL Goals Pt Will Perform Grooming: with supervision;standing Pt Will Perform Upper Body Bathing: with contact guard assist;sitting Pt Will Perform Upper Body Dressing: with contact guard assist;sitting Pt  Will Perform Lower Body Dressing: with min assist;sitting/lateral leans;sit to/from stand Pt Will Transfer to Toilet: with supervision;ambulating;regular height toilet;grab bars (with least restrictive AD) Pt Will Perform Toileting - Clothing Manipulation and hygiene: with contact guard assist;sitting/lateral leans;sit to/from stand   OT Frequency:  Min 2X/week    Co-evaluation PT/OT/SLP Co-Evaluation/Treatment: Yes Reason for Co-Treatment: Necessary to address cognition/behavior during functional activity;To address functional/ADL transfers PT goals addressed during session: Mobility/safety with mobility;Balance;Proper use of DME  OT goals addressed during session: ADL's and self-care;Proper use of Adaptive equipment and DME      AM-PAC OT 6 Clicks Daily Activity     Outcome Measure Help from another person eating meals?: A Little Help from another person taking care of personal grooming?: A Little Help from another person toileting, which includes using toliet, bedpan, or urinal?: A Lot Help from another person bathing (including washing, rinsing, drying)?: A Lot Help from another person to put on and taking off regular upper body clothing?: A Lot Help from another person to put on and taking off regular lower body clothing?: A Lot 6 Click Score: 14   End of Session Equipment Utilized During Treatment: Gait belt;Rolling walker (2 wheels);Back brace (TLSO brace) Nurse Communication: Mobility status;Weight bearing status;Precautions;Other (comment) (Pt participated well. Recommend +2 for safety in standing/mobility. OT POC.)  Activity Tolerance: Patient tolerated treatment well;Patient limited by pain Patient left: in chair;with call bell/phone within reach;with nursing/sitter in room (with NT present and notified that chair alarm was in place but not yet turned on)  OT Visit Diagnosis: Unsteadiness on feet (R26.81);Other abnormalities of gait and mobility (R26.89);Muscle weakness  (generalized) (M62.81);History of falling (Z91.81);Other symptoms and signs involving cognitive function                Time: 8951-8883 OT Time Calculation (min): 28 min Charges:  OT General Charges $OT Visit: 1 Visit OT Evaluation $OT Eval Low Complexity: 1 Low  Margarie Rockey HERO., OTR/L, MA Acute Rehab [phone]   Margarie FORBES Horns 03/20/2024, 2:20 PM

## 2024-03-21 DIAGNOSIS — S22000A Wedge compression fracture of unspecified thoracic vertebra, initial encounter for closed fracture: Secondary | ICD-10-CM | POA: Diagnosis not present

## 2024-03-21 LAB — BASIC METABOLIC PANEL WITH GFR
Anion gap: 13 (ref 5–15)
BUN: <5 mg/dL — ABNORMAL LOW (ref 8–23)
CO2: 24 mmol/L (ref 22–32)
Calcium: 8.9 mg/dL (ref 8.9–10.3)
Chloride: 101 mmol/L (ref 98–111)
Creatinine, Ser: 0.74 mg/dL (ref 0.44–1.00)
GFR, Estimated: >60 mL/min
Glucose, Bld: 97 mg/dL (ref 70–99)
Potassium: 3.3 mmol/L — ABNORMAL LOW (ref 3.5–5.1)
Sodium: 138 mmol/L (ref 135–145)

## 2024-03-21 MED ORDER — BISACODYL 10 MG RE SUPP
10.0000 mg | Freq: Once | RECTAL | Status: AC
  Administered 2024-03-21: 10 mg via RECTAL
  Filled 2024-03-21: qty 1

## 2024-03-21 MED ORDER — LIDOCAINE 5 % EX PTCH
1.0000 | MEDICATED_PATCH | CUTANEOUS | Status: DC
  Administered 2024-03-21 – 2024-03-22 (×2): 1 via TRANSDERMAL
  Filled 2024-03-21 (×2): qty 1

## 2024-03-21 MED ORDER — OXYCODONE HCL 5 MG PO TABS
5.0000 mg | ORAL_TABLET | Freq: Four times a day (QID) | ORAL | Status: DC | PRN
  Administered 2024-03-21: 5 mg via ORAL
  Administered 2024-03-21 – 2024-03-22 (×2): 10 mg via ORAL
  Filled 2024-03-21 (×3): qty 2

## 2024-03-21 MED ORDER — POTASSIUM CHLORIDE CRYS ER 20 MEQ PO TBCR
40.0000 meq | EXTENDED_RELEASE_TABLET | Freq: Once | ORAL | Status: AC
  Administered 2024-03-21: 40 meq via ORAL
  Filled 2024-03-21: qty 2

## 2024-03-21 NOTE — Progress Notes (Signed)
 " PROGRESS NOTE    Terri Ayala  FMW:[id number] DOB: Jul 29, 1955 DOA: 03/19/2024 PCP: Terri Ayala   Brief Narrative: 69 year old with past medical history significant for hypertension, peripheral neuropathy, anxiety/depression, EtOH, recent right thumb metacarpal shaft fracture requiring closed reduction/percutaneous pinning who presents to the ED complaining of severe low back pain.  Patient was in her garage when she reached out to grab an object subsequently got dizzy, fell backward and hit her car with her lower back.  Since then she has had excruciating pain.  Evaluation in the ED consistent with T12 compression fracture.  ED MD discussed with neurosurgery who recommended conservative measure including mobilization, pain control and TSL O brace.   Assessment & Plan:   Principal Problem:   Compression fracture of body of thoracic vertebra (HCC) Active Problems:   Hearing loss   Uncomplicated alcohol  dependence (HCC)   Major depressive disorder, recurrent episode, moderate (HCC)   Benign essential HTN   1-T12 compression fracture: - After Terri fall, history of osteoporosis on bisphosphonate - EDMD discussed with neurosurgery recommendation are for pain control, PT OT evaluation, T SLO brace. -Continue with Pain management -Senna-docusate to help prevent constipation.  Pain is not controlled, requiring IV morphine  this morning.  Increase oxycodone  dose and add lidocaine  patch.   Frequent falls: - Patient had falls with rib fractures in the past, recently right pelvic fracture requiring pinning on 1/16. - Presented after Terri fall now with T12 fracture. - Suspect  related to alcohol  use PT eval. Refuse SNF CM will assist with HH, and care at home  EtOH use: - Patient reports last alcoholic drink was 7 days ago.  She was tremulous on admission. - Continue CIWA  Hypertension: - Continue Coreg   Anxiety/depression: -Continue BuSpar , trazodone , hydroxyzine  and  Wellbutrin   Peripheral neuropathy: - Continue Neurontin   Recent right thumb fracture status post closed reduction percutaneous pinning on 1/16 - Per operative note nonweightbearing to the right upper extremity, spica splint follow-up in office in 2 weeks   Estimated body mass index is 26.21 kg/m as calculated from the following:   Height as of this encounter: 5' 2 (1.575 m).   Weight as of this encounter: 65 kg.   DVT prophylaxis: Lovenox  Code Status: Full code Family Communication: care discussed with patient.  Disposition Plan:  Status is: Observation The patient remains OBS appropriate and will d/c before 2 midnights.    Consultants:  ED MD discussed case with neurosurgery   Procedures:    Antimicrobials:    Subjective: No BM yet. Willing to try suppository Pain is not controlled.    Objective: Vitals:   03/20/24 2356 03/21/24 0408 03/21/24 0840 03/21/24 1209  BP: (!) 145/74 102/69 (!) 162/84 (!) 157/120  Pulse: 87 91 87 90  Resp: 19 19    Temp: 97.7 F (36.5 C) 98.7 F (37.1 C) 98 F (36.7 C) 98.2 F (36.8 C)  TempSrc: Oral Oral Oral Oral  SpO2: 100% 97% 97% 100%  Weight:      Height:        Intake/Output Summary (Last 24 hours) at 03/21/2024 1244 Last data filed at 03/21/2024 0406 Gross per 24 hour  Intake 1446.67 ml  Output 2050 ml  Net -603.33 ml   Filed Weights   03/20/24 2328  Weight: 65 kg    Examination:  General exam: NAD Respiratory system: CTA Cardiovascular system: S 1, S 2 RRR Gastrointestinal system: ABS present, soft, nt Central nervous system: alert Extremities: no edema.  Data Reviewed: I have personally reviewed following labs and imaging studies  CBC: Recent Labs  Lab 03/19/24 0326 03/19/24 0330 03/20/24 0507  WBC 8.1  --  8.0  NEUTROABS 5.4  --   --   HGB 12.0 12.9 11.4*  HCT 37.5 38.0 34.0*  MCV 93.1  --  89.5  PLT 338  --  271   Basic Metabolic Panel: Recent Labs  Lab 03/19/24 0326 03/19/24 0330  03/20/24 0507 03/21/24 0752  NA 139 142 138 138  K 4.0 4.1 4.3 3.3*  CL 104 103 101 101  CO2 24  --  19* 24  GLUCOSE 73 74 111* 97  BUN 12 14 6* <5*  CREATININE 0.88 0.90 0.81 0.74  CALCIUM  9.2  --  8.3* 8.9  MG  --   --  1.9  --   PHOS  --   --  3.3  --    GFR: Estimated Creatinine Clearance: 58.8 mL/min (by C-G formula based on SCr of 0.74 mg/dL). Liver Function Tests: Recent Labs  Lab 03/19/24 0326  AST 30  ALT 9  ALKPHOS 65  BILITOT 0.3  PROT 7.1  ALBUMIN 4.3   No results for input(s): LIPASE, AMYLASE in the last 168 hours. No results for input(s): AMMONIA in the last 168 hours. Coagulation Profile: No results for input(s): INR, PROTIME in the last 168 hours. Cardiac Enzymes: No results for input(s): CKTOTAL, CKMB, CKMBINDEX, TROPONINI in the last 168 hours. BNP (last 3 results) No results for input(s): PROBNP in the last 8760 hours. HbA1C: No results for input(s): HGBA1C in the last 72 hours. CBG: No results for input(s): GLUCAP in the last 168 hours. Lipid Profile: No results for input(s): CHOL, HDL, LDLCALC, TRIG, CHOLHDL, LDLDIRECT in the last 72 hours. Thyroid  Function Tests: No results for input(s): TSH, T4TOTAL, FREET4, T3FREE, THYROIDAB in the last 72 hours. Anemia Panel: Recent Labs    03/20/24 0858  FERRITIN 65  TIBC 346  IRON 111   Sepsis Labs: No results for input(s): PROCALCITON, LATICACIDVEN in the last 168 hours.  No results found for this or any previous visit (from the past 240 hours).       Radiology Studies: No results found.       Scheduled Meds:  amLODipine   2.5 mg Oral Daily   buPROPion   150 mg Oral BH-q7a   busPIRone   15 mg Oral TID   carvedilol   6.25 mg Oral BID WC   enoxaparin  (LOVENOX ) injection  40 mg Subcutaneous Q24H   folic acid   1 mg Oral Daily   hydrOXYzine   25 mg Oral TID   lidocaine   1 patch Transdermal Q24H   multivitamin with minerals  1 tablet Oral  Daily   pantoprazole   40 mg Oral BID   polyethylene glycol  17 g Oral Daily   senna-docusate  1 tablet Oral BID   thiamine   100 mg Oral Daily   Or   thiamine   100 mg Intravenous Daily   traZODone   100 mg Oral QHS   Continuous Infusions:   LOS: 1 day    Time spent: 35 Minutes    Terri Ayala Terri Cletis Muma, MD [facility]   If 7PM-7AM, please contact night-coverage [email or web address]  03/21/2024, 12:44 PM   "

## 2024-03-21 NOTE — Progress Notes (Signed)

## 2024-03-21 NOTE — Plan of Care (Signed)
" °  Problem: Health Behavior/Discharge Planning: Goal: Ability to manage health-related needs will improve Outcome: Progressing   Problem: Clinical Measurements: Goal: Ability to maintain clinical measurements within normal limits will improve Outcome: Progressing   Problem: Clinical Measurements: Goal: Will remain free from infection Outcome: Progressing   Problem: Elimination: Goal: Will not experience complications related to bowel motility Outcome: Progressing   Problem: Safety: Goal: Ability to remain free from injury will improve Outcome: Progressing   "

## 2024-03-21 NOTE — Plan of Care (Signed)

## 2024-03-21 NOTE — TOC Initial Note (Signed)
 Transition of Care [facility]) - Initial/Assessment Note    Patient Details  Name: Terri Ayala MRN: [id number] Date of Birth: October 25, 1955  Transition of Care [facility]) CM/SW Contact:    Rosaline JONELLE Joe, RN Phone Number: 03/21/2024, 12:14 PM  Clinical Narrative:                 CM met with the patient at the bedside and patient lives at home alone and has home helpers [facility] 5-7 days a week - 5-8 hours per day.  Home help[facility] plans to increase hours to 24 hours per day.  Patient is currently in pain and plans are to likely discharge to home tomorrow if pain controlled and medically stable per MD.   Patient declines SNf placement and plans to return home.  DME in the home includes RW, Cane, shower seat, 3:1 and WC.  Patient has TSLO brace in the room at this time which will be sent home with the patient.  Patient was faxed out in the hub for home health and patient [facility] orders for Pt/OT placed - to be co-signed by MD  I called Home [facility]ers [phone] and she is aware of pending discharge tomorrow.  They will likely increase hours to 24 hour care in the home.  They will provide transportation to home tomorrow by car.  Patient states that she does not smoke, declines drug use and states that she quit drinking ETOH on New Year's this year.  Patient was agreeable to OP Counseling - provided in the AVS.        Patient Goals and CMS Choice            Expected Discharge Plan and Services                                              Prior Living Arrangements/Services                       Activities of Daily Living   ADL Screening (condition at time of admission) Independently performs ADLs?: Yes (appropriate for developmental age) Is the patient deaf or have difficulty hearing?: Yes (Has hearing aids but does not have them with her) Does the patient have difficulty seeing, even when wearing glasses/contacts?: No Does the patient have  difficulty concentrating, remembering, or making decisions?: No  Permission Sought/Granted                  Emotional Assessment              Admission diagnosis:  T12 compression fracture, initial encounter (HCC) [S22.080A] Fall, initial encounter [W19.XXXA] Compression fracture of body of thoracic vertebra (HCC) [S22.000A] Patient Active Problem List   Diagnosis Date Noted   Compression fracture of body of thoracic vertebra (HCC) 03/19/2024   Closed displaced fracture of shaft of first metacarpal bone of right hand 03/15/2024   Thumb fracture 03/12/2024   Contusion of toe 12/05/2023   Diarrhea, unspecified 12/05/2023   Dysphagia, unspecified 12/05/2023   Family history of malignant neoplasm of colon 12/05/2023   Preoperative clearance 10/19/2023   Malnutrition of moderate degree 07/28/2023   Alcohol  withdrawal (HCC) 07/24/2023   Elevated TSH 07/24/2023   Sensorineural hearing loss, bilateral 01/01/2023   Foreign body of right ear 01/01/2023   Alcoholic peripheral neuropathy 12/28/2022   Mild  cognitive impairment 10/17/2022   Balance problem 05/23/2022   Abnormal movement 05/23/2022   Weakness of both hands 05/23/2022   Rosacea 05/17/2021   Seborrheic keratosis 05/17/2021   Melanocytic nevi of trunk 05/17/2021   Chronic pain of left ankle 05/12/2021   Body mass index 26.0-26.9, adult 05/12/2021   Thyroid  nodule 02/10/2021   Prolonged QT interval 02/10/2021   Elevated cholesterol 10/19/2020   Gastroparesis 03/25/2020   Gastroesophageal reflux disease without esophagitis 03/25/2020   Elevated LFTs 07/12/2018   Left rib fracture 01/04/2018   Leukocytosis 12/08/2017   Benign essential HTN 12/08/2017   Osteopenia 08/27/2016   Major depressive disorder, recurrent episode, moderate (HCC) 01/27/2016   Alcohol  dependence with withdrawal (HCC) 01/27/2016   Uncomplicated alcohol  dependence (HCC) 04/03/2014   Lumbosacral radiculopathy at L5 05/20/2013    Protein-calorie malnutrition, severe 03/18/2013   Nonspecific elevation of levels of transaminase or lactic acid dehydrogenase (LDH) 03/16/2013   Hepatic steatosis 03/16/2013   Chronic cholecystitis 03/14/2013   Cervical arthritis 10/12/2012   Palpitations 11/29/2010   Supraventricular tachycardia 11/29/2010   Mitral valve prolapse 11/29/2010   Decreased libido 11/15/2010   Multinodular goiter 08/17/2009   Migraine 08/17/2009   Hearing loss 08/17/2009   Osteoporosis 08/17/2009   Generalized anxiety disorder 08/17/2009   Eosinophilic esophagitis 06/19/2007   PCP:  [name] Pharmacy:   [facility] - [place], [place] - [address] [place] [address] Phone: [phone] Fax: [phone]  [id number] - [place], [place] - [address] AT [address] [place] [address] Phone: [phone] Fax: [phone]     Social Drivers of Health (SDOH) Social History: SDOH Screenings   Food Insecurity: No Food Insecurity (03/19/2024)  Housing: Low Risk (03/19/2024)  Transportation Needs: No Transportation Needs (03/19/2024)  Utilities: Not At Risk (03/19/2024)  Alcohol  Screen: Low Risk (10/18/2023)  Depression (PHQ2-9): Low Risk (05/01/2023)  Financial Resource Strain: Medium Risk (10/18/2023)  Physical Activity: Insufficiently Active (10/18/2023)  Social Connections: Moderately Isolated (03/19/2024)  Stress: Stress Concern Present (10/18/2023)  Tobacco Use: Medium Risk (03/19/2024)  Health Literacy: Adequate Health Literacy (01/23/2023)   SDOH Interventions:     Readmission Risk Interventions    03/21/2024   12:13 PM  Readmission Risk Prevention Plan  Transportation Screening Complete  PCP or Specialist Appt within 5-7 Days Complete  Home Care Screening Complete  Medication Review (RN CM) Complete

## 2024-03-22 ENCOUNTER — Other Ambulatory Visit (HOSPITAL_COMMUNITY): Payer: Self-pay

## 2024-03-22 DIAGNOSIS — S22000A Wedge compression fracture of unspecified thoracic vertebra, initial encounter for closed fracture: Secondary | ICD-10-CM | POA: Diagnosis not present

## 2024-03-22 LAB — BASIC METABOLIC PANEL WITH GFR
Anion gap: 14 (ref 5–15)
BUN: 7 mg/dL — ABNORMAL LOW (ref 8–23)
CO2: 20 mmol/L — ABNORMAL LOW (ref 22–32)
Calcium: 8.9 mg/dL (ref 8.9–10.3)
Chloride: 102 mmol/L (ref 98–111)
Creatinine, Ser: 0.76 mg/dL (ref 0.44–1.00)
GFR, Estimated: >60 mL/min
Glucose, Bld: 88 mg/dL (ref 70–99)
Potassium: 3.6 mmol/L (ref 3.5–5.1)
Sodium: 136 mmol/L (ref 135–145)

## 2024-03-22 MED ORDER — AMLODIPINE BESYLATE 2.5 MG PO TABS
2.5000 mg | ORAL_TABLET | Freq: Every day | ORAL | 0 refills | Status: AC
  Filled 2024-03-22: qty 30, 30d supply, fill #0

## 2024-03-22 MED ORDER — SENNOSIDES-DOCUSATE SODIUM 8.6-50 MG PO TABS
1.0000 | ORAL_TABLET | Freq: Two times a day (BID) | ORAL | 0 refills | Status: AC
  Filled 2024-03-22: qty 30, 15d supply, fill #0

## 2024-03-22 MED ORDER — LIDOCAINE 5 % EX PTCH
1.0000 | MEDICATED_PATCH | CUTANEOUS | 0 refills | Status: AC
  Filled 2024-03-22: qty 30, 30d supply, fill #0

## 2024-03-22 MED ORDER — POLYETHYLENE GLYCOL 3350 17 GM/SCOOP PO POWD
17.0000 g | Freq: Every day | ORAL | 0 refills | Status: AC
  Filled 2024-03-22: qty 238, 14d supply, fill #0

## 2024-03-22 MED ORDER — THIAMINE HCL 100 MG PO TABS
100.0000 mg | ORAL_TABLET | Freq: Every day | ORAL | 0 refills | Status: AC
  Filled 2024-03-22: qty 30, 30d supply, fill #0

## 2024-03-22 MED ORDER — OXYCODONE HCL 5 MG PO TABS
5.0000 mg | ORAL_TABLET | Freq: Four times a day (QID) | ORAL | 0 refills | Status: AC | PRN
  Filled 2024-03-22: qty 30, 4d supply, fill #0

## 2024-03-22 NOTE — Discharge Summary (Signed)
 " Physician Discharge Summary   Patient: Terri Ayala MRN: [id number] DOB: 1955-05-28  Admit date:     03/19/2024  Discharge date: 03/22/24  Discharge Physician: Owen DELENA Lore   PCP: Gayle Saddie FALCON, PA-C   Recommendations at discharge:    Needs follow up with PCP for further care of compression fracture.  Continue counseling about alcohol  use.   Discharge Diagnoses: Principal Problem:   Compression fracture of body of thoracic vertebra (HCC) Active Problems:   Hearing loss   Uncomplicated alcohol  dependence (HCC)   Major depressive disorder, recurrent episode, moderate (HCC)   Benign essential HTN  Resolved Problems:   * No resolved hospital problems. [facility] Course: 69 year old with past medical history significant for hypertension, peripheral neuropathy, anxiety/depression, EtOH, recent right thumb metacarpal shaft fracture requiring closed reduction/percutaneous pinning who presents to the ED complaining of severe low back pain. Patient was in her garage when she reached out to grab an object subsequently got dizzy, fell backward and hit her car with her lower back. Since then she has had excruciating pain. Evaluation in the ED consistent with T12 compression fracture. ED MD discussed with neurosurgery who recommended conservative measure including mobilization, pain control and TSL O brace.   Assessment and Plan:   1-T12 compression fracture: - After a fall, history of osteoporosis on bisphosphonate - EDMD discussed with neurosurgery recommendation are for pain control, PT OT evaluation, T SLO brace. -Continue with Pain management -Senna-docusate to help prevent constipation.  Pain better controlled on oxycodone  dose and  lidocaine  patch.  Stable for discharge  Frequent falls: - Patient had falls with rib fractures in the past, recently right pelvic fracture requiring pinning on 1/16. - Presented after a fall now with T12 fracture. - Suspect  related to alcohol   use -PT eval. Refuse SNF -CM will assist with HH, and care at home  Home health arrange.   EtOH use: - Patient reports last alcoholic drink was 7 days ago.  She was tremulous on admission. - Continue CIWA   Hypertension: - Continue Coreg    Anxiety/depression: -Continue BuSpar , trazodone , hydroxyzine  and Wellbutrin    Peripheral neuropathy: - Continue Neurontin    Recent right thumb fracture status post closed reduction percutaneous pinning on 1/16 - Per operative note nonweightbearing to the right upper extremity, spica splint follow-up in office in 2 weeks     Estimated body mass index is 26.21 kg/m as calculated from the following:   Height as of this encounter: 5' 2 (1.575 m).   Weight as of this encounter: 65 kg.           Consultants: None Procedures performed: none Disposition: Home Diet recommendation:  Regular diet DISCHARGE MEDICATION: Allergies as of 03/22/2024       Reactions   Doxycycline Swelling   Mouth swelling and sores   Tetracycline Hcl Swelling, Other (See Comments)   Mouth swelling and sores   Codeine Itching        Medication List     STOP taking these medications    thiamine  100 MG tablet Commonly known as: VITAMIN B1       TAKE these medications    ADULT GUMMY PO Take 2 tablets by mouth daily.   albuterol  108 (90 Base) MCG/ACT inhaler Commonly known as: VENTOLIN  HFA INHALE 2 PUFFS INTO THE LUNGS EVERY 6 HOURS AS NEEDED FOR WHEEZING OR SHORTNESS OF BREATH.   alendronate  70 MG tablet Commonly known as: FOSAMAX  Take 70 mg by mouth every Monday.  amLODipine  2.5 MG tablet Commonly known as: NORVASC  Take 1 tablet (2.5 mg total) by mouth daily. Start taking on: March 23, 2024   ascorbic acid 500 MG tablet Commonly known as: VITAMIN C Take 500 mg by mouth daily.   baclofen  10 MG tablet Commonly known as: LIORESAL  Take 10 mg by mouth 3 (three) times daily.   buPROPion  150 MG 24 hr tablet Commonly known as:  Wellbutrin  XL Take 1 tablet (150 mg total) by mouth every morning.   busPIRone  15 MG tablet Commonly known as: BUSPAR  Take 15 mg by mouth 3 (three) times daily.   CALCIUM  600+D PO Take 1 tablet by mouth daily.   carvedilol  6.25 MG tablet Commonly known as: COREG  TAKE 1 TABLET BY MOUTH 2 TIMES DAILY WITH A MEAL.   feeding supplement Liqd Take 237 mLs by mouth 3 (three) times daily between meals.   fluticasone  50 MCG/ACT nasal spray Commonly known as: FLONASE  Place 2 sprays into both nostrils daily as needed for allergies.   folic acid  1 MG tablet Commonly known as: FOLVITE  TAKE 1 TABLET BY MOUTH DAILY.   gabapentin  300 MG capsule Commonly known as: NEURONTIN  TAKE 1 CAPSULE BY MOUTH 3 TIMES DAILY.   hydrOXYzine  25 MG tablet Commonly known as: ATARAX  Take 25 mg by mouth 3 (three) times daily.   lidocaine  5 % Commonly known as: LIDODERM  Place 1 patch onto the skin daily. Remove & Discard patch within 12 hours or as directed by MD   meloxicam  15 MG tablet Commonly known as: MOBIC  Take 1 tablet (15 mg total) by mouth daily. What changed:  when to take this reasons to take this   norethindrone -ethinyl estradiol  1-5 MG-MCG Tabs tablet Commonly known as: FEMHRT  1/5 Take 1 tablet by mouth daily.   oxyCODONE  5 MG immediate release tablet Commonly known as: Roxicodone  Take 1-2 tablets (5-10 mg total) by mouth every 6 (six) hours as needed for up to 5 days for moderate pain (pain score 4-6) or severe pain (pain score 7-10). What changed:  how much to take reasons to take this   pantoprazole  40 MG tablet Commonly known as: PROTONIX  TAKE 1 TABLET BY MOUTH 2 TIMES DAILY.   polyethylene glycol 17 g packet Commonly known as: MIRALAX  / GLYCOLAX  Take 17 g by mouth daily. Start taking on: March 23, 2024   propranolol  10 MG tablet Commonly known as: INDERAL  Take 1-2 tablets at onset of panic no more than 2 times daily   senna-docusate 8.6-50 MG tablet Commonly known  as: Senokot-S Take 1 tablet by mouth 2 (two) times daily.   SUMAtriptan  100 MG tablet Commonly known as: IMITREX  TAKE 1 TABLET BY MOUTH AS NEEDED FOR MIGRAINE. MAY REPEAT IN 2 HOURS IF HEADACHE PERSISTS OR RECURS. MAX 2 TABLETS PER DAY   Testosterone  20.25 MG/1.25GM (1.62%) Gel Place onto the skin.   thiamine  100 MG tablet Commonly known as: Vitamin B-1 Take 1 tablet (100 mg total) by mouth daily. You may have different instructions for this medication elsewhere on this list. Ask your doctor how you should be taking this medication. Start taking on: March 23, 2024   traZODone  100 MG tablet Commonly known as: DESYREL  Take 100 mg by mouth at bedtime.        Follow-up Information     Care, [facility] Follow up.   Specialty: Home Health Services Why: [facility] will provide home health services.  They will call you in the next 1-2 days to set  up services. Contact information: [address] [place] [address] [phone]                Discharge Exam: Fredricka Weights   03/20/24 2328  Weight: 65 kg   General; NAD  Condition at discharge: stable  The results of significant diagnostics from this hospitalization (including imaging, microbiology, ancillary and laboratory) are listed below for reference.   Imaging Studies: CT L-SPINE NO CHARGE Result Date: 03/19/2024 EXAM: CT OF THE LUMBAR SPINE WITHOUT CONTRAST 03/19/2024 04:36:37 AM TECHNIQUE: CT of the lumbar spine was performed without the administration of intravenous contrast. Multiplanar reformatted images are provided for review. Automated exposure control, iterative reconstruction, and/or weight based adjustment of the mA/kV was utilized to reduce the radiation dose to as low as reasonably achievable. COMPARISON: CT chest, abdomen, and pelvis, thoracic spine CT reported separately today. CT abdomen and pelvis 07/24/2023. CLINICAL HISTORY: 69 year old female with fall from standing  and pain. FINDINGS: Normal lumbar segmentation, concordant with the thoracic CT today. BONES AND ALIGNMENT: On these images, the T12 compression fracture appears unhealed and is likely to be acute or subacute (coronal image 34). See additional details on thoracic CT reported separately today. Normal lumbar vertebral body heights. Stable lumbar lordosis since last year. No acute fracture or suspicious bone lesion. Normal alignment. SOFT TISSUES: Lumbar paraspinal soft tissues within normal limits. DEGENERATIVE CHANGES: Generally mild for age lumbar spine degeneration. Chronic disc and endplate degeneration at L5-S1. Capacious underlying lumbar spinal canal. IMPRESSION: 1. On these images, the T12 compression fracture appears unhealed and likely acute or subacute. See additional details on thoracic CT reported separately today. 2. No acute traumatic injury identified in the lumbar spine. 3. CT chest, abdomen, and pelvis today reported separately. Electronically signed by: Helayne Hurst MD 03/19/2024 05:26 AM EST RP Workstation: [id number]   CT CHEST ABDOMEN PELVIS W CONTRAST Result Date: 03/19/2024 EXAM: CT CHEST, ABDOMEN AND PELVIS WITH CONTRAST 03/19/2024 04:36:37 AM TECHNIQUE: CT of the chest, abdomen and pelvis was performed with the administration of 75 mL of iohexol  (OMNIPAQUE ) 350 MG/ML injection. Multiplanar reformatted images are provided for review. Automated exposure control, iterative reconstruction, and/or weight based adjustment of the mA/kV was utilized to reduce the radiation dose to as low as reasonably achievable. COMPARISON: CT chest, abdomen, and pelvis 07/24/2023. CT thoracic and lumbar spine, reported separately today. Thyroid  ultrasound 02/11/2021. CLINICAL HISTORY: 69 year old female. Polytrauma, blunt. Fall from standing. Dizziness. Pain. FINDINGS: CHEST: MEDIASTINUM AND LYMPH NODES: Heart and pericardium are unremarkable. The central airways are clear. No mediastinal, hilar or axillary  lymphadenopathy. Heterogeneous thyroid  with chronic enlargement of the right lobe appears stable from prior CT and was previously evaluated on dedicated thyroid  ultrasound. Size is stable at the upper limits of normal. LUNGS AND PLEURA: Larger lung volumes. Linear peribronchial opacity in the lingula with enhancement more resembles atelectasis than infection. Mild superimposed bilateral lung base scarring, including in the anterior basal segment of the left lower lobe and in the lateral segment of the right middle lobe. No convincing pulmonary contusion. No pleural effusion. No pneumothorax. ABDOMEN AND PELVIS: LIVER: Unremarkable. GALLBLADDER AND BILE DUCTS: Chronic cholecystectomy. No biliary ductal dilatation. SPLEEN: No acute abnormality. PANCREAS: No acute abnormality. ADRENAL GLANDS: No acute abnormality. KIDNEYS, URETERS AND BLADDER: Renal enhancement and contrast excretion are symmetric and normal. Normal ureters and early contrast excretion in the urinary bladder on the delayed images. No stones in the kidneys or ureters. No hydronephrosis. No perinephric or periureteral stranding. GI AND BOWEL:  Stomach demonstrates no acute abnormality. Gastric antrum and pylorus appear thickened (series 7 image 42) but there is no convincing acute gastroduodenal or other bowel loop inflammation. Redundant large bowel especially the sigmoid colon in the pelvis. Distal large bowel retained stool is mild to moderate. More upstream large bowel fluid is noted through the transverse colon. Fluid containing small bowel loops. No dilated large or small bowel. Cecum is on a lax mesentery. Terminal ileum is decompressed. Normal appendix on series 3 image 76. REPRODUCTIVE ORGANS: No acute abnormality. PERITONEUM AND RETROPERITONEUM: No ascites. No free air. VASCULATURE: Aorta is normal in caliber. Moderate calcified aortoiliac atherosclerosis in the abdomen and pelvis. Major arterial structures and the portal venous system are  enhancing and patent. Mild for age calcified atherosclerosis in the chest. ABDOMINAL AND PELVIS LYMPH NODES: No lymphadenopathy. BONES AND SOFT TISSUES: Numerous nonacute bilateral rib fractures, including chronic or healing fractures of the left lateral 2nd through 7th ribs, and superimposed on the left posterior 4th through 9th rib fractures. Similar chronic posterolateral right 5th through 10th rib fractures. No acute rib fracture identified. Sacrum, SI joints, pelvis and proximal femurs are stable and intact. However, there is chronic avascular necrosis of the right femoral head. No superficial soft tissue injury. IMPRESSION: 1. Numerous nonacute bilateral rib fractures. See also Thoracic spine CT reported separately today. No other acute traumatic injury identified in the chest, abdomen, or pelvis. 2. Fluid containing nondilated small and large bowel loops. Enteritis/diarrhea not excluded. No other acute or inflammatory process identified in the abdomen or pelvis. Normal appendix. 3. Chronic right femoral head AVN. Aortic Atherosclerosis (ICD10-I70.0). Electronically signed by: Helayne Hurst MD 03/19/2024 05:21 AM EST RP Workstation: [id number]   CT T-SPINE NO CHARGE Result Date: 03/19/2024 EXAM: CT THORACIC SPINE WITHOUT CONTRAST 03/19/2024 04:36:37 AM TECHNIQUE: CT of the thoracic spine was performed without the administration of intravenous contrast. Multiplanar reformatted images are provided for review. Automated exposure control, iterative reconstruction, and/or weight based adjustment of the mA/kV was utilized to reduce the radiation dose to as low as reasonably achievable. COMPARISON: CT chest, abdomen, and pelvis reported separately today. Previous thoracic spine CT 07/24/2023. CT of the cervical spine reported separately today. CLINICAL HISTORY: 69 year old female with fall from standing, dizziness, and pain. FINDINGS: Small ribs at T12 on the prior CT with otherwise normal thoracic segmentation.  BONES AND ALIGNMENT: T12 compression fracture (sagittal image 37) is new from 07/24/2023, with 50% loss of vertebral body height. No significant retropulsion. T12 visible posterior elements appear intact and aligned. T5 mild inferior endplate compression asymmetric to the left is also new, but appears sclerotic. Minimal loss of height. No retropulsion. T5 posterior elements appear intact and aligned. Mildly exaggerated thoracic kyphosis has not significantly changed. Other thoracic vertebral height is maintained and stable. Chronic posterior rib fractures on the left at the 2nd and 5th ribs and on the right at the 9th through 11th ribs. Right 10th transverse process fracture appears new since last year but sclerotic and chronic (series 5 image 61 and series 3 image 108). No suspicious bone lesion. Normal alignment except for the T12 compression fracture and T5 endplate compression, and mildly exaggerated thoracic kyphosis. SOFT TISSUES: Thoracic paraspinal soft tissues are within normal limits. Chest and abdomen are reported separately today. DEGENERATIVE CHANGES: Mostly age appropriate thoracic spine degeneration. No high grade thoracic spinal stenosis by CT. IMPRESSION: 1. Age indeterminate T12 compression fracture (50% loss of height, no retropulsion or other complicating features) is new since 07/24/2023. If specific  therapy such as vertebroplasty is desired, MRI without contrast or a nuclear medicine whole-body bone scan would best determine acuity and candidacy for intervention. 2. Mild T5 compression fracture and right T10 transverse process fracture are also new but appear chronic by CT. Superimposed chronic bilateral posterior rib fractures. 3. CT chest, abdomen, and pelvis reported separately today. Electronically signed by: Helayne Hurst MD 03/19/2024 05:05 AM EST RP Workstation: [id number]   CT CERVICAL SPINE WO CONTRAST Result Date: 03/19/2024 EXAM: CT CERVICAL SPINE WITHOUT CONTRAST 03/19/2024  04:36:37 AM TECHNIQUE: CT of the cervical spine was performed without the administration of intravenous contrast. Multiplanar reformatted images are provided for review. Automated exposure control, iterative reconstruction, and/or weight based adjustment of the mA/kV was utilized to reduce the radiation dose to as low as reasonably achievable. COMPARISON: CT head reported separately today. Cervical spine CT 09/07/2023. Prior thyroid  ultrasound 02/11/2021. CLINICAL HISTORY: 69 year old female. Neck trauma, fall from standing, dizziness, pain. FINDINGS: BONES AND ALIGNMENT: Stable cervical lordosis. Underlying chronic dextroconvex cervical scoliosis. C2 spina bifida occulta, normal variant. No acute fracture or traumatic malalignment. DEGENERATIVE CHANGES: Posterior element ankylosis from the C2 through the C5 level. Stable superimposed degenerative mild anterolisthesis of C5 on C6, with chronic severe left side facet arthropathy at that level. Chronic severe disc and endplate degeneration at C6-C7, faint vacuum disc there now. Chronic posterior occipital condyle-C1 advanced degeneration on the left side. Capacious CT appearance of the cervical spinal canal. SOFT TISSUES: No prevertebral soft tissue swelling. Chronic homogeneous thyroid  enlargement, evaluated on prior imaging. Calcified cervical carotid artery atherosclerosis in the bilateral neck. IMPRESSION: 1. No acute traumatic injury identified in the cervical spine. 2. Advanced chronic cervical spine degeneration with widespread posterior element ankylosis superimposed on scoliosis, mild spondylolisthesis Electronically signed by: Helayne Hurst MD 03/19/2024 04:55 AM EST RP Workstation: [id number]   CT HEAD WO CONTRAST ( ) Result Date: 03/19/2024 EXAM: CT HEAD WITHOUT CONTRAST 03/19/2024 04:36:37 AM TECHNIQUE: CT of the head was performed without the administration of intravenous contrast. Automated exposure control, iterative reconstruction, and/or weight  based adjustment of the mA/kV was utilized to reduce the radiation dose to as low as reasonably achievable. COMPARISON: Brain MRI 06/23/2022, CT head 08/08/2023. CLINICAL HISTORY: 70 year old female. Fall from standing, dizziness, pain. FINDINGS: BRAIN AND VENTRICLES: No acute hemorrhage. No evidence of acute infarct. No hydrocephalus. No extra-axial collection. No mass effect or midline shift. Brain volume not significantly changed since 2024. Chronic mostly periventricular but intense white matter changes also appears stable from previous MRI appearance. Chronic dystrophic calcification in the medial left cerebellum on series 3 image 6 has not significantly changed since 2016. Punctate calcified atherosclerosis at the skull base. No suspicious intracranial vascular hyperdensity. ORBITS: No acute orbital injury identified. SINUSES: Paranasal sinuses, tympanic cavities and mastoids remain well aerated. SOFT TISSUES AND SKULL: No acute scalp soft tissue injury identified. No skull fracture. IMPRESSION: 1. No acute intracranial abnormality or acute traumatic injury identified. Port 2. Stable chronic white matter disease, small chronic dystrophic calcification in the left cerebellum. Electronically signed by: Helayne Hurst MD 03/19/2024 04:51 AM EST RP Workstation: [id number]   DG MINI C-ARM IMAGE ONLY Result Date: 03/15/2024 There is no interpretation for this exam.  This order is for images obtained during a surgical procedure.  Please See Surgeries Tab for more information regarding the procedure.   XR Hand Complete Right Result Date: 03/14/2024 X-rays of the right hand oblique fracture extending through the base of the right first metacarpal with lateral displacement  DG Hand  Complete Right Result Date: 03/04/2024 CLINICAL DATA:  Initial evaluation for acute trauma, fall. EXAM: RIGHT HAND - COMPLETE 3+ VIEW COMPARISON:  Comparison made with concomitant radiograph of the right wrist performed at the same  time. FINDINGS: Acute oblique fracture extending through the base of the right first metacarpal with lateral displacement and overlap. Swelling seen within the overlying soft tissues of the radial aspect of the right hand and wrist. No other acute osseous abnormality. Moderate degenerative osteoarthritic changes present throughout the visualized hand and wrist. IMPRESSION: Acute oblique fracture extending through the base of the right first metacarpal with lateral displacement and overlap. Electronically Signed   By: Morene Hoard M.D.   On: 03/04/2024 15:44   DG Wrist Complete Right Result Date: 03/04/2024 CLINICAL DATA:  Initial evaluation for acute trauma, fall. EXAM: RIGHT WRIST - COMPLETE 3+ VIEW COMPARISON:  None Available. FINDINGS: Acute oblique fracture extending through the base of the right first metacarpal with lateral displacement and overlap. Swelling seen within the overlying soft tissues of the radial aspect of the right hand and wrist. No other acute osseous abnormality. Degenerative osteoarthritic changes noted throughout the visualized hand. Few scattered vascular calcifications noted about the wrist. IMPRESSION: Acute oblique fracture extending through the base of the right first metacarpal with lateral displacement and overlap. Electronically Signed   By: Morene Hoard M.D.   On: 03/04/2024 15:43    Microbiology: Results for orders placed or performed during the hospital encounter of 02/15/22  MRSA Next Gen by PCR, Nasal     Status: None   Collection Time: 02/15/22 11:43 PM   Specimen: Nasal Mucosa; Nasal Swab  Result Value Ref Range Status   MRSA by PCR Next Gen NOT DETECTED NOT DETECTED Final    Comment: (NOTE) The GeneXpert MRSA Assay (FDA approved for NASAL specimens only), is one component of a comprehensive MRSA colonization surveillance program. It is not intended to diagnose MRSA infection nor to guide or monitor treatment for MRSA infections. Test  performance is not FDA approved in patients less than 59 years old. Performed at [facility], [address], [place], [place] [address]     Labs: CBC: Recent Labs  Lab 03/19/24 0326 03/19/24 0330 03/20/24 0507  WBC 8.1  --  8.0  NEUTROABS 5.4  --   --   HGB 12.0 12.9 11.4*  HCT 37.5 38.0 34.0*  MCV 93.1  --  89.5  PLT 338  --  271   Basic Metabolic Panel: Recent Labs  Lab 03/19/24 0326 03/19/24 0330 03/20/24 0507 03/21/24 0752 03/22/24 0711  NA 139 142 138 138 136  K 4.0 4.1 4.3 3.3* 3.6  CL 104 103 101 101 102  CO2 24  --  19* 24 20*  GLUCOSE 73 74 111* 97 88  BUN 12 14 6* <5* 7*  CREATININE 0.88 0.90 0.81 0.74 0.76  CALCIUM  9.2  --  8.3* 8.9 8.9  MG  --   --  1.9  --   --   PHOS  --   --  3.3  --   --    Liver Function Tests: Recent Labs  Lab 03/19/24 0326  AST 30  ALT 9  ALKPHOS 65  BILITOT 0.3  PROT 7.1  ALBUMIN 4.3   CBG: No results for input(s): GLUCAP in the last 168 hours.  Discharge time spent: greater than 30 minutes.  Signed: Owen DELENA Lore, MD [facility] 03/22/2024 "

## 2024-03-22 NOTE — TOC Transition Note (Addendum)
 Transition of Care [facility]) - Discharge Note   Patient Details  Name: Terri Ayala MRN: [id number] Date of Birth: 1955/08/30  Transition of Care [name]) CM/SW Contact:  Rosaline JONELLE Joe, RN Phone Number: 03/22/2024, 11:51 AM   Clinical Narrative:    CM spoke with the patient at the bedside and patient plans to return home by her private pay caregivers later this afternoon.  The patient states that she reached out to [employer], private pay caregiver and she is aware that she is being discharged and will arrange for patient's transport to home by private vehicle,  Flavia Ka is caregiver with [facility] and they will be providing 24 hour care at the home for a couple of weeks.  Patient has all DME at the home - including TSLO brace at the bedside.  Patient low longer consumes ETOH but she is aware that I provided resources in the AVS for patient to follow up with counseling if needed.  Patient ride for home is coming at 4 pm thru [employer].  Discharge summary was included in the patient's discharge packet for [facility].          Patient Goals and CMS Choice            Discharge Placement                       Discharge Plan and Services Additional resources added to the After Visit Summary for                                       Social Drivers of Health (SDOH) Interventions SDOH Screenings   Food Insecurity: No Food Insecurity (03/19/2024)  Housing: Low Risk (03/19/2024)  Transportation Needs: No Transportation Needs (03/19/2024)  Utilities: Not At Risk (03/19/2024)  Alcohol  Screen: Low Risk (10/18/2023)  Depression (PHQ2-9): Low Risk (05/01/2023)  Financial Resource Strain: Medium Risk (10/18/2023)  Physical Activity: Insufficiently Active (10/18/2023)  Social Connections: Moderately Isolated (03/19/2024)  Stress: Stress Concern Present (10/18/2023)  Tobacco Use: Medium Risk (03/19/2024)  Health Literacy: Adequate Health  Literacy (01/23/2023)     Readmission Risk Interventions    03/21/2024   12:13 PM  Readmission Risk Prevention Plan  Transportation Screening Complete  PCP or Specialist Appt within 5-7 Days Complete  Home Care Screening Complete  Medication Review (RN CM) Complete

## 2024-03-22 NOTE — Plan of Care (Signed)
  Problem: Education: Goal: Knowledge of General Education information will improve Description Including pain rating scale, medication(s)/side effects and non-pharmacologic comfort measures  Outcome: Progressing   Problem: Health Behavior/Discharge Planning: Goal: Ability to manage health-related needs will improve Outcome: Progressing   Problem: Clinical Measurements: Goal: Respiratory complications will improve Outcome: Progressing   Problem: Elimination: Goal: Will not experience complications related to bowel motility Outcome: Progressing   

## 2024-03-22 NOTE — Progress Notes (Signed)
 Physical Therapy Treatment Patient Details Name: Terri Ayala MRN: [id number] DOB: 07/07/1955 Today's Date: 03/22/2024   History of Present Illness Pt is 69 yo presenting to [facility] due to a ground level fall where pt fell backwards and hit her head. Found T12 compression fracture. Neurosurgery recommends conservative measures with TLSO. PMH: HTN, peripheral neuropathy, anxiety/depression, EtOH abuse, recent R thumb metacarpal shaft fracture with closed reduction/percutaneous pinning.    PT Comments  Pt has improved significantly since initial evaluation. Currently pt is Mod I to CGA for bed mobility, Mod I for sit to stand with RW and CGA for 200 ft of gait with RW. Pt demonstrates good safety and is NWB on the RUE. Due to pt current functional status, home set up and available assistance at home recommending skilled physical therapy services 3x/week in order to address strength, balance and functional mobility to decrease risk for falls, injury and re-hospitalization.       If plan is discharge home, recommend the following: A little help with walking and/or transfers;Assistance with cooking/housework;Assist for transportation;Supervision due to cognitive status;Help with stairs or ramp for entrance     Equipment Recommendations  None recommended by PT       Precautions / Restrictions Precautions Precautions: Back;Fall Precaution Booklet Issued: No Recall of Precautions/Restrictions: Impaired Required Braces or Orthoses: Spinal Brace;Splint/Cast Spinal Brace: Thoracolumbosacral orthotic;Applied in sitting position Splint/Cast: R wrist and hand due to recent R thumb metacarpal shaft fracture with closed reduction/percutaneous pinning Splint/Cast - Date Prophylactic Dressing Applied (if applicable): 03/15/24 Restrictions Weight Bearing Restrictions Per Provider Order: Yes RUE Weight Bearing Per Provider Order: Non weight bearing Other Position/Activity Restrictions: due to previous  fracture of the meatcarpal in the R hand with closed reduction and percutaneous pinning.     Mobility  Bed Mobility Overal bed mobility: Modified Independent, Needs Assistance Bed Mobility: Rolling, Sidelying to Sit, Sit to Sidelying Rolling: Modified independent (Device/Increase time), Used rails Sidelying to sit: Modified independent (Device/Increase time), Used rails     Sit to sidelying: Contact guard assist General bed mobility comments: increased time, HOB slightly elevated and light use of rail, CGA for LE back to EOB    Transfers Overall transfer level: Modified independent Equipment used: Rolling walker (2 wheels) Transfers: Sit to/from Stand Sit to Stand: Modified independent (Device/Increase time)           General transfer comment: good hand placement, no LOB, maintaining WB precautions on the R hand.    Ambulation/Gait Ambulation/Gait assistance: Contact guard assist Gait Distance (Feet): 200 Feet Assistive device: Rolling walker (2 wheels) Gait Pattern/deviations: Step-through pattern, Decreased stride length Gait velocity: mildly decreased Gait velocity interpretation: 1.31 - 2.62 ft/sec, indicative of limited community ambulator   General Gait Details: guarded gait, no LOB      Balance Overall balance assessment: Mild deficits observed, not formally tested Sitting-balance support: Single extremity supported, Feet supported, Feet unsupported Sitting balance-Leahy Scale: Fair     Standing balance support: Bilateral upper extremity supported, During functional activity, Single extremity supported Standing balance-Leahy Scale: Fair Standing balance comment: no overt LOB          Communication Communication Communication: Impaired Factors Affecting Communication: Hearing impaired  Cognition Arousal: Alert Behavior During Therapy: Anxious   PT - Cognitive impairments: Sequencing, Problem solving, Attention     Following commands:  Impaired Following commands impaired: Follows one step commands with increased time, Follows multi-step commands inconsistently    Cueing Cueing Techniques: Verbal cues, Tactile cues, Visual cues  General Comments General comments (skin integrity, edema, etc.): No signs/symptoms of cardiac/respiratory distress during session      Pertinent Vitals/Pain Pain Assessment Pain Assessment: Faces Faces Pain Scale: Hurts a little bit Negative Vocalization: occasional moan/groan, low speech, negative/disapproving quality Facial Expression: smiling or inexpressive Body Language: relaxed Consolability: no need to console Pain Location: back Pain Descriptors / Indicators: Discomfort Pain Intervention(s): Limited activity within patient's tolerance, Monitored during session, Repositioned     PT Goals (current goals can now be found in the care plan section) Acute Rehab PT Goals Patient Stated Goal: decrease pain PT Goal Formulation: With patient Time For Goal Achievement: 04/03/24 Potential to Achieve Goals: Fair Progress towards PT goals: Progressing toward goals    Frequency    Min 2X/week      PT Plan  Continue with current POC        AM-PAC PT 6 Clicks Mobility   Outcome Measure  Help needed turning from your back to your side while in a flat bed without using bedrails?: None Help needed moving from lying on your back to sitting on the side of a flat bed without using bedrails?: None Help needed moving to and from a bed to a chair (including a wheelchair)?: A Little Help needed standing up from a chair using your arms (e.g., wheelchair or bedside chair)?: A Little Help needed to walk in hospital room?: A Little Help needed climbing 3-5 steps with a railing? : A Little 6 Click Score: 20    End of Session Equipment Utilized During Treatment: Gait belt;Back brace Activity Tolerance: Patient tolerated treatment well Patient left: in bed;with call bell/phone within  reach;with bed alarm set Nurse Communication: Mobility status;Precautions PT Visit Diagnosis: Unsteadiness on feet (R26.81);Other abnormalities of gait and mobility (R26.89);Muscle weakness (generalized) (M62.81);Pain Pain - Right/Left: Right Pain - part of body:  (back)     Time: 8678-8664 PT Time Calculation (min) (ACUTE ONLY): 14 min  Charges:    $Therapeutic Activity: 8-22 mins PT General Charges $$ ACUTE PT VISIT: 1 Visit                    Dorothyann Maier, DPT, CLT  Acute Rehabilitation Services Office: [phone] (Secure chat preferred)    Dorothyann VEAR Maier 03/22/2024, 3:48 PM

## 2024-03-25 ENCOUNTER — Telehealth: Payer: Self-pay

## 2024-03-25 NOTE — Transitions of Care (Post Inpatient/ED Visit) (Unsigned)
" ° °  03/25/2024  Name: Terri Ayala MRN: [id number] DOB: 1955/03/16  Today's TOC FU Call Status: Today's TOC FU Call Status:: Unsuccessful Call (1st Attempt) Unsuccessful Call (1st Attempt) Date: 03/25/24  Attempted to reach the patient regarding the most recent Inpatient/ED visit.  Follow Up Plan: Additional outreach attempts will be made to reach the patient to complete the Transitions of Care (Post Inpatient/ED visit) call.   Signature Julian Lemmings, LPN [facility] Nurse Health Advisor Direct Dial [phone]  "

## 2024-03-26 NOTE — Transitions of Care (Post Inpatient/ED Visit) (Unsigned)
" ° °  03/26/2024  Name: Terri Ayala MRN: [id number] DOB: August 26, 1955  Today's TOC FU Call Status: Today's TOC FU Call Status:: Unsuccessful Call (2nd Attempt) Unsuccessful Call (1st Attempt) Date: 03/25/24 Unsuccessful Call (2nd Attempt) Date: 03/26/24  Attempted to reach the patient regarding the most recent Inpatient/ED visit.  Follow Up Plan: Additional outreach attempts will be made to reach the patient to complete the Transitions of Care (Post Inpatient/ED visit) call.   Signature Julian Lemmings, LPN [facility] Nurse Health Advisor Direct Dial [phone]  "

## 2024-03-27 NOTE — Transitions of Care (Post Inpatient/ED Visit) (Signed)
" ° °  03/27/2024  Name: Terri Ayala MRN: [id number] DOB: 1955/09/28  Today's TOC FU Call Status: Today's TOC FU Call Status:: Unsuccessful Call (3rd Attempt) Unsuccessful Call (1st Attempt) Date: 03/25/24 Unsuccessful Call (2nd Attempt) Date: 03/26/24 Unsuccessful Call (3rd Attempt) Date: 03/27/24  Attempted to reach the patient regarding the most recent Inpatient/ED visit.  Follow Up Plan: No further outreach attempts will be made at this time. We have been unable to contact the patient.  Signature Julian Lemmings, LPN [facility] Nurse Health Advisor Direct Dial [phone]  "

## 2024-03-28 ENCOUNTER — Emergency Department (HOSPITAL_COMMUNITY)

## 2024-03-28 ENCOUNTER — Encounter (HOSPITAL_COMMUNITY): Payer: Self-pay

## 2024-03-28 ENCOUNTER — Other Ambulatory Visit: Payer: Self-pay

## 2024-03-28 ENCOUNTER — Emergency Department (HOSPITAL_COMMUNITY)
Admission: EM | Admit: 2024-03-28 | Discharge: 2024-03-29 | Disposition: A | Discharge status: Home / Self Care | Attending: Emergency Medicine | Admitting: Emergency Medicine

## 2024-03-28 ENCOUNTER — Encounter: Admitting: Orthopedic Surgery

## 2024-03-28 DIAGNOSIS — F1290 Cannabis use, unspecified, uncomplicated: Secondary | ICD-10-CM | POA: Diagnosis not present

## 2024-03-28 DIAGNOSIS — E860 Dehydration: Secondary | ICD-10-CM | POA: Diagnosis not present

## 2024-03-28 DIAGNOSIS — R410 Disorientation, unspecified: Secondary | ICD-10-CM | POA: Insufficient documentation

## 2024-03-28 DIAGNOSIS — N179 Acute kidney failure, unspecified: Secondary | ICD-10-CM | POA: Insufficient documentation

## 2024-03-28 DIAGNOSIS — Z79899 Other long term (current) drug therapy: Secondary | ICD-10-CM | POA: Diagnosis not present

## 2024-03-28 DIAGNOSIS — N390 Urinary tract infection, site not specified: Secondary | ICD-10-CM | POA: Insufficient documentation

## 2024-03-28 DIAGNOSIS — Y900 Blood alcohol level of less than 20 mg/100 ml: Secondary | ICD-10-CM | POA: Insufficient documentation

## 2024-03-28 LAB — CBC WITH DIFFERENTIAL/PLATELET
Abs Immature Granulocytes: 0.14 10*3/uL — ABNORMAL HIGH (ref 0.00–0.07)
Band Neutrophils: 0 %
Basophils Absolute: 0.1 10*3/uL (ref 0.0–0.1)
Basophils Relative: 1 %
Blasts: 0 %
Eosinophils Absolute: 0.1 10*3/uL (ref 0.0–0.5)
Eosinophils Relative: 1 %
HCT: 36.4 % (ref 36.0–46.0)
Hemoglobin: 12.5 g/dL (ref 12.0–15.0)
Immature Granulocytes: 1 %
Lymphocytes Relative: 11 %
Lymphs Abs: 1.5 10*3/uL (ref 0.7–4.0)
MCH: 30.3 pg (ref 26.0–34.0)
MCHC: 34.3 g/dL (ref 30.0–36.0)
MCV: 88.1 fL (ref 80.0–100.0)
Metamyelocytes Relative: 0 %
Monocytes Absolute: 1.1 10*3/uL — ABNORMAL HIGH (ref 0.1–1.0)
Monocytes Relative: 8 %
Myelocytes: 0 %
Neutro Abs: 10.7 10*3/uL — ABNORMAL HIGH (ref 1.7–7.7)
Neutrophils Relative %: 78 %
Other: 0 %
Platelets: 319 10*3/uL (ref 150–400)
Promyelocytes Relative: 0 %
RBC: 4.13 MIL/uL (ref 3.87–5.11)
RDW: 14.6 % (ref 11.5–15.5)
WBC: 13.6 10*3/uL — ABNORMAL HIGH (ref 4.0–10.5)
nRBC: 0 % (ref 0.0–0.2)
nRBC: 0 /100{WBCs}

## 2024-03-28 LAB — COMPREHENSIVE METABOLIC PANEL WITH GFR
ALT: 8 U/L (ref 0–44)
AST: 25 U/L (ref 15–41)
Albumin: 4.4 g/dL (ref 3.5–5.0)
Alkaline Phosphatase: 96 U/L (ref 38–126)
Anion gap: 16 — ABNORMAL HIGH (ref 5–15)
BUN: 13 mg/dL (ref 8–23)
CO2: 22 mmol/L (ref 22–32)
Calcium: 9.5 mg/dL (ref 8.9–10.3)
Chloride: 101 mmol/L (ref 98–111)
Creatinine, Ser: 1.46 mg/dL — ABNORMAL HIGH (ref 0.44–1.00)
GFR, Estimated: 39 mL/min — ABNORMAL LOW
Glucose, Bld: 94 mg/dL (ref 70–99)
Potassium: 4.1 mmol/L (ref 3.5–5.1)
Sodium: 139 mmol/L (ref 135–145)
Total Bilirubin: 0.3 mg/dL (ref 0.0–1.2)
Total Protein: 7.4 g/dL (ref 6.5–8.1)

## 2024-03-28 LAB — URINALYSIS, ROUTINE W REFLEX MICROSCOPIC
Glucose, UA: NEGATIVE mg/dL
Hgb urine dipstick: NEGATIVE
Ketones, ur: NEGATIVE mg/dL
Nitrite: NEGATIVE
Protein, ur: 100 mg/dL — AB
Specific Gravity, Urine: 1.016 (ref 1.005–1.030)
pH: 5 (ref 5.0–8.0)

## 2024-03-28 LAB — CBG MONITORING, ED: Glucose-Capillary: 99 mg/dL (ref 70–99)

## 2024-03-28 LAB — ETHANOL: Alcohol, Ethyl (B): <15 mg/dL

## 2024-03-28 LAB — URINE DRUG SCREEN
Amphetamines: NEGATIVE
Barbiturates: NEGATIVE
Benzodiazepines: NEGATIVE
Cocaine: NEGATIVE
Fentanyl: NEGATIVE
Methadone Scn, Ur: NEGATIVE
Opiates: NEGATIVE
Tetrahydrocannabinol: POSITIVE — AB

## 2024-03-28 LAB — AMMONIA: Ammonia: 20 umol/L (ref 9–35)

## 2024-03-28 MED ORDER — SODIUM CHLORIDE 0.9 % IV SOLN
1.0000 g | Freq: Once | INTRAVENOUS | Status: AC
  Administered 2024-03-28: 1 g via INTRAVENOUS
  Filled 2024-03-28: qty 10

## 2024-03-28 MED ORDER — CEPHALEXIN 500 MG PO CAPS: 500.0000 mg | ORAL_CAPSULE | Freq: Four times a day (QID) | ORAL | 0 refills | Status: DC

## 2024-03-28 MED ORDER — THIAMINE HCL 100 MG/ML IJ SOLN
100.0000 mg | Freq: Once | INTRAMUSCULAR | Status: AC
  Administered 2024-03-28: 100 mg via INTRAVENOUS
  Filled 2024-03-28: qty 2

## 2024-03-28 MED ORDER — ACETAMINOPHEN 325 MG PO TABS
650.0000 mg | ORAL_TABLET | Freq: Once | ORAL | Status: AC
  Administered 2024-03-28: 650 mg via ORAL
  Filled 2024-03-28: qty 2

## 2024-03-28 MED ORDER — LACTATED RINGERS IV BOLUS
1000.0000 mL | Freq: Once | INTRAVENOUS | Status: AC
  Administered 2024-03-28: 1000 mL via INTRAVENOUS

## 2024-03-28 NOTE — ED Triage Notes (Signed)
 Pt to ER via EMS from home with c/o confusion since this AM.  Pt has caregiver that comes to home and has not been since last Thursday due to weather.  States when she arrived today pt was confused.  Pt reports knowing that she is confused.  Also reports being sober for last 2 weeks and has already gone through withdraws.

## 2024-03-28 NOTE — ED Provider Triage Note (Signed)
 Emergency Medicine Provider Triage Evaluation Note  Terri Ayala , a 69 y.o. female  was evaluated in triage.  Pt complains noted to appear confused by caregiver today.  Hx etoh use disorder. Pt denies specific pain or other complaints. Denies new trauma/fall. Denies fevers.   Review of Systems  Positive: confusion Negative: Fever, pain.   Physical Exam  BP 101/61 (BP Location: Left Arm)   Pulse 82   Temp 97.8 F (36.6 C) (Oral)   Resp 19   Ht 1.549 m (5' 1)   Wt 59 kg   SpO2 100%   BMI 24.56 kg/m  Gen:   Awake, no distress   Resp:  Normal effort breathing comfortably.  Abd:   Soft nt neuro:  Alert, oriented to person, place. Mildly confused re time/data, recent events. Speech is clear/fluent. Motor/sens grossly intact bil.   Medical Decision Making  Medically screening exam initiated at 10:04 PM.  Appropriate orders placed.  Terri Ayala was informed that the remainder of the evaluation will be completed by another provider, this initial triage assessment does not replace that evaluation, and the importance of remaining in the ED until their evaluation is complete.  Labs and imaging ordered.    Bernard Drivers, MD 03/28/24 [id number]

## 2024-03-28 NOTE — Discharge Instructions (Addendum)
 It was our pleasure to provide your ER care today - we hope that you feel better.  Your lab tests show a possible urine infection, and your kidney function test is mildly elevated indicating possible dehydration. Take keflex  (antibiotic) as prescribed. Make sure to drink plenty of fluids and stay well hydrated.   Follow up closely with your primary care doctor in the next 2-3 days. Follow up with orthopedist as planned for your hand fracture.   Avoid alcohol  and/or marijuana use.   Return to ER if worse, new symptoms, fevers, new/severe pain, chest pain, trouble breathing, vomiting, severe abdominal pain, or other concern.

## 2024-03-28 NOTE — ED Provider Notes (Addendum)
 " [place] [facility] AT [facility] Provider Note   CSN: [id number] Arrival date & time: 03/28/24  1901     Patient presents with: Altered Mental Status   Terri Ayala is a 69 y.o. female.   Patient with hx etoh use disorder, came to ED after caregiver checked on her this AM and felt she was confused. Pt indicates has completed withdrawal symptoms, denies tremor or shakes (indicates last drink ~ 2 weeks ago). No hallucinations or delusions noted. Pt w occasional thc use, denies other drug use. Denies trauma or fall. No headache. Denies chest pain or sob. No cough or uri symptoms. No abd pain or nvd. Pt does note recent relatively poor po intake. No dysuria. No new extremity pain or swelling (has right 1st metacarpal fx, splint in place, no new or worsening pain). No fever or chills.   The history is provided by the patient and medical records.  Altered Mental Status Associated symptoms: no abdominal pain, no fever, no headaches, no nausea, no rash, no vomiting and no weakness        Prior to Admission medications  Medication Sig Start Date End Date Taking? Authorizing Provider  albuterol  (VENTOLIN  HFA) 108 (90 Base) MCG/ACT inhaler INHALE 2 PUFFS INTO THE LUNGS EVERY 6 HOURS AS NEEDED FOR WHEEZING OR SHORTNESS OF BREATH. 11/29/23   Gayle Numbers F, PA-C  alendronate  (FOSAMAX ) 70 MG tablet Take 70 mg by mouth every Monday.    [provider]  amLODipine  (NORVASC ) 2.5 MG tablet Take 1 tablet (2.5 mg total) by mouth daily. 03/23/24   Regalado, Belkys A, MD  ascorbic acid (VITAMIN C) 500 MG tablet Take 500 mg by mouth daily.    [provider]  baclofen  (LIORESAL ) 10 MG tablet Take 10 mg by mouth 3 (three) times daily.    [provider]  buPROPion  (WELLBUTRIN  XL) 150 MG 24 hr tablet Take 1 tablet (150 mg total) by mouth every morning. 09/14/23 03/19/24  Kober, Charles E, PA-C  busPIRone  (BUSPAR ) 15 MG tablet Take 15 mg by mouth 3 (three) times  daily. 01/17/24   [provider]  Calcium  Carbonate-Vitamin D  (CALCIUM  600+D PO) Take 1 tablet by mouth daily.    [provider]  carvedilol  (COREG ) 6.25 MG tablet TAKE 1 TABLET BY MOUTH 2 TIMES DAILY WITH A MEAL. 03/01/24   Gayle, Kara F, PA-C  feeding supplement (ENSURE PLUS HIGH PROTEIN) LIQD Take 237 mLs by mouth 3 (three) times daily between meals. Patient not taking: Reported on 03/19/2024 08/07/23   Raenelle Donalda CHRISTELLA, MD  fluticasone  (FLONASE ) 50 MCG/ACT nasal spray Place 2 sprays into both nostrils daily as needed for allergies.    [provider]  folic acid  (FOLVITE ) 1 MG tablet TAKE 1 TABLET BY MOUTH DAILY. 01/15/24   Chandra Toribio POUR, MD  gabapentin  (NEURONTIN ) 300 MG capsule TAKE 1 CAPSULE BY MOUTH 3 TIMES DAILY. 03/05/24   Gayle Numbers FALCON, PA-C  hydrOXYzine  (ATARAX ) 25 MG tablet Take 25 mg by mouth 3 (three) times daily. 01/18/23   [provider]  lidocaine  (LIDODERM ) 5 % Place 1 patch onto the skin daily. Remove & Discard patch within 12 hours or as directed by MD 03/22/24   Regalado, Belkys A, MD  meloxicam  (MOBIC ) 15 MG tablet Take 1 tablet (15 mg total) by mouth daily. Patient taking differently: Take 15 mg by mouth daily as needed for pain. 12/05/23   Scherrie Lamarr SQUIBB, DPM  Multiple Vitamins-Minerals (ADULT GUMMY  PO) Take 2 tablets by mouth daily.    [provider]  norethindrone -ethinyl estradiol  (FEMHRT  1/5) 1-5 MG-MCG TABS tablet Take 1 tablet by mouth daily.    [provider]  pantoprazole  (PROTONIX ) 40 MG tablet TAKE 1 TABLET BY MOUTH 2 TIMES DAILY. 01/15/24   Chandra Toribio POUR, MD  polyethylene glycol powder (GLYCOLAX /MIRALAX ) 17 GM/SCOOP powder Dissolve 1 capful (17g) in 4-8 ounces of liquid and take by mouth daily. 03/23/24   Regalado, Owen A, MD  propranolol  (INDERAL ) 10 MG tablet Take 1-2 tablets at onset of panic no more than 2 times daily 11/16/23   Kober, Charles E, PA-C  senna-docusate (SENOKOT-S) 8.6-50 MG tablet Take  1 tablet by mouth 2 (two) times daily. 03/22/24   Regalado, Belkys A, MD  SUMAtriptan  (IMITREX ) 100 MG tablet TAKE 1 TABLET BY MOUTH AS NEEDED FOR MIGRAINE. MAY REPEAT IN 2 HOURS IF HEADACHE PERSISTS OR RECURS. MAX 2 TABLETS PER DAY 08/15/23   Gayle Numbers F, PA-C  Testosterone  20.25 MG/1.25GM (1.62%) GEL Place onto the skin. Patient not taking: Reported on 03/19/2024 08/09/23   [provider]  thiamine  (VITAMIN B1) 100 MG tablet Take 1 tablet (100 mg total) by mouth daily. 03/23/24   Regalado, Belkys A, MD  traZODone  (DESYREL ) 100 MG tablet Take 100 mg by mouth at bedtime. 10/13/23   [provider]    Allergies: Doxycycline, Tetracycline hcl, and Codeine    Review of Systems  Constitutional:  Negative for chills and fever.  HENT:  Negative for sore throat.   Eyes:  Negative for visual disturbance.  Respiratory:  Negative for cough and shortness of breath.   Cardiovascular:  Negative for chest pain.  Gastrointestinal:  Negative for abdominal pain, nausea and vomiting.  Genitourinary:  Negative for dysuria and flank pain.  Musculoskeletal:  Negative for back pain and neck pain.  Skin:  Negative for rash.  Neurological:  Negative for speech difficulty, weakness, numbness and headaches.    Updated Vital Signs BP 118/64 (BP Location: Right Arm)   Pulse 77   Temp 98.1 F (36.7 C) (Oral)   Resp 18   Ht 1.549 m (5' 1)   Wt 59 kg   SpO2 100%   BMI 24.56 kg/m   Physical Exam Vitals and nursing note reviewed.  Constitutional:      Appearance: Normal appearance. She is well-developed.  HENT:     Head: Atraumatic.     Nose: Nose normal.     Mouth/Throat:     Mouth: Mucous membranes are moist.  Eyes:     General: No scleral icterus.    Conjunctiva/sclera: Conjunctivae normal.     Pupils: Pupils are equal, round, and reactive to light.  Neck:     Vascular: No carotid bruit.     Trachea: No tracheal deviation.     Comments: Trachea midline, thyroid  not grossly  enlarged or tender. No neck stiffness or rigidity.  Cardiovascular:     Rate and Rhythm: Normal rate and regular rhythm.     Pulses: Normal pulses.     Heart sounds: Normal heart sounds. No murmur heard.    No friction rub. No gallop.  Pulmonary:     Effort: Pulmonary effort is normal. No respiratory distress.     Breath sounds: Normal breath sounds.  Abdominal:     General: Bowel sounds are normal. There is no distension.     Palpations: Abdomen is soft.     Tenderness: There is no abdominal tenderness. There  is no guarding.  Genitourinary:    Comments: No cva tenderness.  Musculoskeletal:     Cervical back: Normal range of motion and neck supple. No rigidity. No muscular tenderness.     Right lower leg: No edema.     Left lower leg: No edema.     Comments: Splint intact right wrist/hand. Normal cap refill in digits.   Skin:    General: Skin is warm and dry.     Findings: No rash.  Neurological:     Mental Status: She is alert.     Comments: Alert, speech normal. Pt is alert, oriented to person, place, day. Mildly confused  re some recent events. No dysarthria or aphasia noted. Motor/sens grossly intact bil. Steady gait. No ataxia. No tremor or shakes.   Psychiatric:        Mood and Affect: Mood normal.     Comments: Pt calm, conversant. Pt does not appear to be responding to internal stimuli - no acute hallucinations or psychosis noted.      (all labs ordered are listed, but only abnormal results are displayed) Results for orders placed or performed during the hospital encounter of 03/28/24  CBG monitoring, ED   Collection Time: 03/28/24  8:21 PM  Result Value Ref Range   Glucose-Capillary 99 70 - 99 mg/dL  CBC with Differential   Collection Time: 03/28/24  8:36 PM  Result Value Ref Range   WBC 13.6 (H) 4.0 - 10.5 K/uL   RBC 4.13 3.87 - 5.11 MIL/uL   Hemoglobin 12.5 12.0 - 15.0 g/dL   HCT 63.5 63.9 - 53.9 %   MCV 88.1 80.0 - 100.0 fL   MCH 30.3 26.0 - 34.0 pg   MCHC  34.3 30.0 - 36.0 g/dL   RDW 85.3 88.4 - 84.4 %   Platelets 319 150 - 400 K/uL   nRBC 0.0 0.0 - 0.2 %   Neutrophils Relative % 78 %   Lymphocytes Relative 11 %   Monocytes Relative 8 %   Eosinophils Relative 1 %   Basophils Relative 1 %   Band Neutrophils 0 %   Immature Granulocytes 1 %   Metamyelocytes Relative 0 %   Myelocytes 0 %   Promyelocytes Relative 0 %   Blasts 0 %   nRBC 0 0 /100 WBC   Other 0 %   Neutro Abs 10.7 (H) 1.7 - 7.7 K/uL   Lymphs Abs 1.5 0.7 - 4.0 K/uL   Monocytes Absolute 1.1 (H) 0.1 - 1.0 K/uL   Eosinophils Absolute 0.1 0.0 - 0.5 K/uL   Basophils Absolute 0.1 0.0 - 0.1 K/uL   Abs Immature Granulocytes 0.14 (H) 0.00 - 0.07 K/uL  Comprehensive metabolic panel   Collection Time: 03/28/24  8:36 PM  Result Value Ref Range   Sodium 139 135 - 145 mmol/L   Potassium 4.1 3.5 - 5.1 mmol/L   Chloride 101 98 - 111 mmol/L   CO2 22 22 - 32 mmol/L   Glucose, Bld 94 70 - 99 mg/dL   BUN 13 8 - 23 mg/dL   Creatinine, Ser 8.53 (H) 0.44 - 1.00 mg/dL   Calcium  9.5 8.9 - 10.3 mg/dL   Total Protein 7.4 6.5 - 8.1 g/dL   Albumin 4.4 3.5 - 5.0 g/dL   AST 25 15 - 41 U/L   ALT 8 0 - 44 U/L   Alkaline Phosphatase 96 38 - 126 U/L   Total Bilirubin 0.3 0.0 - 1.2 mg/dL   GFR, Estimated  39 (L) >60 mL/min   Anion gap 16 (H) 5 - 15  Ethanol   Collection Time: 03/28/24  8:47 PM  Result Value Ref Range   Alcohol , Ethyl (B) <15 <15 mg/dL  Ammonia   Collection Time: 03/28/24  8:47 PM  Result Value Ref Range   Ammonia 20 9 - 35 umol/L  Urinalysis, Routine w reflex microscopic -Urine, Clean Catch   Collection Time: 03/28/24  9:17 PM  Result Value Ref Range   Color, Urine AMBER (A) YELLOW   APPearance CLOUDY (A) CLEAR   Specific Gravity, Urine 1.016 1.005 - 1.030   pH 5.0 5.0 - 8.0   Glucose, UA NEGATIVE NEGATIVE mg/dL   Hgb urine dipstick NEGATIVE NEGATIVE   Bilirubin Urine SMALL (A) NEGATIVE   Ketones, ur NEGATIVE NEGATIVE mg/dL   Protein, ur 899 (A) NEGATIVE mg/dL    Nitrite NEGATIVE NEGATIVE   Leukocytes,Ua LARGE (A) NEGATIVE   RBC / HPF 0-5 0 - 5 RBC/hpf   WBC, UA 11-20 0 - 5 WBC/hpf   Bacteria, UA MANY (A) NONE SEEN   Squamous Epithelial / HPF 6-10 0 - 5 /HPF   Mucus PRESENT    Hyaline Casts, UA PRESENT   Urine Drug Screen   Collection Time: 03/28/24  9:17 PM  Result Value Ref Range   Opiates NEGATIVE NEGATIVE   Cocaine NEGATIVE NEGATIVE   Benzodiazepines NEGATIVE NEGATIVE   Amphetamines NEGATIVE NEGATIVE   Tetrahydrocannabinol POSITIVE (A) NEGATIVE   Barbiturates NEGATIVE NEGATIVE   Methadone Scn, Ur NEGATIVE NEGATIVE   Fentanyl  NEGATIVE NEGATIVE   CT Head Wo Contrast Result Date: 03/28/2024 EXAM: CT HEAD WITHOUT CONTRAST 03/28/2024 10:33:35 PM TECHNIQUE: CT of the head was performed without the administration of intravenous contrast. Automated exposure control, iterative reconstruction, and/or weight based adjustment of the mA/kV was utilized to reduce the radiation dose to as low as reasonably achievable. COMPARISON: CT head 03/19/2024. CLINICAL HISTORY: Mental status change, unknown cause. FINDINGS: BRAIN AND VENTRICLES: No acute hemorrhage. No evidence of acute infarct. Patchy and confluent areas of decreased attenuation are noted throughout the deep and periventricular white matter of the cerebral hemispheres bilaterally suggestive of chronic microvascular ischemic changes. Cerebral ventricle sizes are concordant with the degree of cerebral volume loss. No extra-axial collection. No mass effect or midline shift. ORBITS: No acute abnormality. Bilateral lens replacement. SINUSES: No acute abnormality. SOFT TISSUES AND SKULL: No acute soft tissue abnormality. No skull fracture. IMPRESSION: 1. No acute intracranial abnormality. Electronically signed by: Morgane Naveau MD 03/28/2024 10:44 PM EST RP Workstation: [id number]   DG Chest 2 View Result Date: 03/28/2024 EXAM: 2 VIEW(S) XRAY OF THE CHEST 03/28/2024 08:36:53 PM COMPARISON: CXR 01/18/2024, CT  Chest 03/19/2024. CLINICAL HISTORY: Rule out pneumonia. FINDINGS: LUNGS AND PLEURA: No focal pulmonary opacity. No pleural effusion. No pneumothorax. HEART AND MEDIASTINUM: No acute abnormality of the cardiac and mediastinal silhouettes. BONES AND SOFT TISSUES: Multiple old healed bilateral rib fractures. IMPRESSION: 1. No acute findings. Electronically signed by: Morgane Naveau MD 03/28/2024 08:48 PM EST RP Workstation: [id number]   CT L-SPINE NO CHARGE Result Date: 03/19/2024 EXAM: CT OF THE LUMBAR SPINE WITHOUT CONTRAST 03/19/2024 04:36:37 AM TECHNIQUE: CT of the lumbar spine was performed without the administration of intravenous contrast. Multiplanar reformatted images are provided for review. Automated exposure control, iterative reconstruction, and/or weight based adjustment of the mA/kV was utilized to reduce the radiation dose to as low as reasonably achievable. COMPARISON: CT chest, abdomen, and pelvis, thoracic spine CT reported separately today. CT abdomen  and pelvis 07/24/2023. CLINICAL HISTORY: 69 year old female with fall from standing and pain. FINDINGS: Normal lumbar segmentation, concordant with the thoracic CT today. BONES AND ALIGNMENT: On these images, the T12 compression fracture appears unhealed and is likely to be acute or subacute (coronal image 34). See additional details on thoracic CT reported separately today. Normal lumbar vertebral body heights. Stable lumbar lordosis since last year. No acute fracture or suspicious bone lesion. Normal alignment. SOFT TISSUES: Lumbar paraspinal soft tissues within normal limits. DEGENERATIVE CHANGES: Generally mild for age lumbar spine degeneration. Chronic disc and endplate degeneration at L5-S1. Capacious underlying lumbar spinal canal. IMPRESSION: 1. On these images, the T12 compression fracture appears unhealed and likely acute or subacute. See additional details on thoracic CT reported separately today. 2. No acute traumatic injury identified  in the lumbar spine. 3. CT chest, abdomen, and pelvis today reported separately. Electronically signed by: Helayne Hurst MD 03/19/2024 05:26 AM EST RP Workstation: [id number]   CT CHEST ABDOMEN PELVIS W CONTRAST Result Date: 03/19/2024 EXAM: CT CHEST, ABDOMEN AND PELVIS WITH CONTRAST 03/19/2024 04:36:37 AM TECHNIQUE: CT of the chest, abdomen and pelvis was performed with the administration of 75 mL of iohexol  (OMNIPAQUE ) 350 MG/ML injection. Multiplanar reformatted images are provided for review. Automated exposure control, iterative reconstruction, and/or weight based adjustment of the mA/kV was utilized to reduce the radiation dose to as low as reasonably achievable. COMPARISON: CT chest, abdomen, and pelvis 07/24/2023. CT thoracic and lumbar spine, reported separately today. Thyroid  ultrasound 02/11/2021. CLINICAL HISTORY: 69 year old female. Polytrauma, blunt. Fall from standing. Dizziness. Pain. FINDINGS: CHEST: MEDIASTINUM AND LYMPH NODES: Heart and pericardium are unremarkable. The central airways are clear. No mediastinal, hilar or axillary lymphadenopathy. Heterogeneous thyroid  with chronic enlargement of the right lobe appears stable from prior CT and was previously evaluated on dedicated thyroid  ultrasound. Size is stable at the upper limits of normal. LUNGS AND PLEURA: Larger lung volumes. Linear peribronchial opacity in the lingula with enhancement more resembles atelectasis than infection. Mild superimposed bilateral lung base scarring, including in the anterior basal segment of the left lower lobe and in the lateral segment of the right middle lobe. No convincing pulmonary contusion. No pleural effusion. No pneumothorax. ABDOMEN AND PELVIS: LIVER: Unremarkable. GALLBLADDER AND BILE DUCTS: Chronic cholecystectomy. No biliary ductal dilatation. SPLEEN: No acute abnormality. PANCREAS: No acute abnormality. ADRENAL GLANDS: No acute abnormality. KIDNEYS, URETERS AND BLADDER: Renal enhancement and contrast  excretion are symmetric and normal. Normal ureters and early contrast excretion in the urinary bladder on the delayed images. No stones in the kidneys or ureters. No hydronephrosis. No perinephric or periureteral stranding. GI AND BOWEL: Stomach demonstrates no acute abnormality. Gastric antrum and pylorus appear thickened (series 7 image 42) but there is no convincing acute gastroduodenal or other bowel loop inflammation. Redundant large bowel especially the sigmoid colon in the pelvis. Distal large bowel retained stool is mild to moderate. More upstream large bowel fluid is noted through the transverse colon. Fluid containing small bowel loops. No dilated large or small bowel. Cecum is on a lax mesentery. Terminal ileum is decompressed. Normal appendix on series 3 image 76. REPRODUCTIVE ORGANS: No acute abnormality. PERITONEUM AND RETROPERITONEUM: No ascites. No free air. VASCULATURE: Aorta is normal in caliber. Moderate calcified aortoiliac atherosclerosis in the abdomen and pelvis. Major arterial structures and the portal venous system are enhancing and patent. Mild for age calcified atherosclerosis in the chest. ABDOMINAL AND PELVIS LYMPH NODES: No lymphadenopathy. BONES AND SOFT TISSUES: Numerous nonacute bilateral rib fractures, including chronic  or healing fractures of the left lateral 2nd through 7th ribs, and superimposed on the left posterior 4th through 9th rib fractures. Similar chronic posterolateral right 5th through 10th rib fractures. No acute rib fracture identified. Sacrum, SI joints, pelvis and proximal femurs are stable and intact. However, there is chronic avascular necrosis of the right femoral head. No superficial soft tissue injury. IMPRESSION: 1. Numerous nonacute bilateral rib fractures. See also Thoracic spine CT reported separately today. No other acute traumatic injury identified in the chest, abdomen, or pelvis. 2. Fluid containing nondilated small and large bowel loops.  Enteritis/diarrhea not excluded. No other acute or inflammatory process identified in the abdomen or pelvis. Normal appendix. 3. Chronic right femoral head AVN. Aortic Atherosclerosis (ICD10-I70.0). Electronically signed by: Helayne Hurst MD 03/19/2024 05:21 AM EST RP Workstation: [id number]   CT T-SPINE NO CHARGE Result Date: 03/19/2024 EXAM: CT THORACIC SPINE WITHOUT CONTRAST 03/19/2024 04:36:37 AM TECHNIQUE: CT of the thoracic spine was performed without the administration of intravenous contrast. Multiplanar reformatted images are provided for review. Automated exposure control, iterative reconstruction, and/or weight based adjustment of the mA/kV was utilized to reduce the radiation dose to as low as reasonably achievable. COMPARISON: CT chest, abdomen, and pelvis reported separately today. Previous thoracic spine CT 07/24/2023. CT of the cervical spine reported separately today. CLINICAL HISTORY: 68 year old female with fall from standing, dizziness, and pain. FINDINGS: Small ribs at T12 on the prior CT with otherwise normal thoracic segmentation. BONES AND ALIGNMENT: T12 compression fracture (sagittal image 37) is new from 07/24/2023, with 50% loss of vertebral body height. No significant retropulsion. T12 visible posterior elements appear intact and aligned. T5 mild inferior endplate compression asymmetric to the left is also new, but appears sclerotic. Minimal loss of height. No retropulsion. T5 posterior elements appear intact and aligned. Mildly exaggerated thoracic kyphosis has not significantly changed. Other thoracic vertebral height is maintained and stable. Chronic posterior rib fractures on the left at the 2nd and 5th ribs and on the right at the 9th through 11th ribs. Right 10th transverse process fracture appears new since last year but sclerotic and chronic (series 5 image 61 and series 3 image 108). No suspicious bone lesion. Normal alignment except for the T12 compression fracture and T5  endplate compression, and mildly exaggerated thoracic kyphosis. SOFT TISSUES: Thoracic paraspinal soft tissues are within normal limits. Chest and abdomen are reported separately today. DEGENERATIVE CHANGES: Mostly age appropriate thoracic spine degeneration. No high grade thoracic spinal stenosis by CT. IMPRESSION: 1. Age indeterminate T12 compression fracture (50% loss of height, no retropulsion or other complicating features) is new since 07/24/2023. If specific therapy such as vertebroplasty is desired, MRI without contrast or a nuclear medicine whole-body bone scan would best determine acuity and candidacy for intervention. 2. Mild T5 compression fracture and right T10 transverse process fracture are also new but appear chronic by CT. Superimposed chronic bilateral posterior rib fractures. 3. CT chest, abdomen, and pelvis reported separately today. Electronically signed by: Helayne Hurst MD 03/19/2024 05:05 AM EST RP Workstation: [id number]   CT CERVICAL SPINE WO CONTRAST Result Date: 03/19/2024 EXAM: CT CERVICAL SPINE WITHOUT CONTRAST 03/19/2024 04:36:37 AM TECHNIQUE: CT of the cervical spine was performed without the administration of intravenous contrast. Multiplanar reformatted images are provided for review. Automated exposure control, iterative reconstruction, and/or weight based adjustment of the mA/kV was utilized to reduce the radiation dose to as low as reasonably achievable. COMPARISON: CT head reported separately today. Cervical spine CT 09/07/2023. Prior thyroid  ultrasound 02/11/2021. CLINICAL  HISTORY: 69 year old female. Neck trauma, fall from standing, dizziness, pain. FINDINGS: BONES AND ALIGNMENT: Stable cervical lordosis. Underlying chronic dextroconvex cervical scoliosis. C2 spina bifida occulta, normal variant. No acute fracture or traumatic malalignment. DEGENERATIVE CHANGES: Posterior element ankylosis from the C2 through the C5 level. Stable superimposed degenerative mild anterolisthesis  of C5 on C6, with chronic severe left side facet arthropathy at that level. Chronic severe disc and endplate degeneration at C6-C7, faint vacuum disc there now. Chronic posterior occipital condyle-C1 advanced degeneration on the left side. Capacious CT appearance of the cervical spinal canal. SOFT TISSUES: No prevertebral soft tissue swelling. Chronic homogeneous thyroid  enlargement, evaluated on prior imaging. Calcified cervical carotid artery atherosclerosis in the bilateral neck. IMPRESSION: 1. No acute traumatic injury identified in the cervical spine. 2. Advanced chronic cervical spine degeneration with widespread posterior element ankylosis superimposed on scoliosis, mild spondylolisthesis Electronically signed by: Helayne Hurst MD 03/19/2024 04:55 AM EST RP Workstation: [id number]   CT HEAD WO CONTRAST ( ) Result Date: 03/19/2024 EXAM: CT HEAD WITHOUT CONTRAST 03/19/2024 04:36:37 AM TECHNIQUE: CT of the head was performed without the administration of intravenous contrast. Automated exposure control, iterative reconstruction, and/or weight based adjustment of the mA/kV was utilized to reduce the radiation dose to as low as reasonably achievable. COMPARISON: Brain MRI 06/23/2022, CT head 08/08/2023. CLINICAL HISTORY: 69 year old female. Fall from standing, dizziness, pain. FINDINGS: BRAIN AND VENTRICLES: No acute hemorrhage. No evidence of acute infarct. No hydrocephalus. No extra-axial collection. No mass effect or midline shift. Brain volume not significantly changed since 2024. Chronic mostly periventricular but intense white matter changes also appears stable from previous MRI appearance. Chronic dystrophic calcification in the medial left cerebellum on series 3 image 6 has not significantly changed since 2016. Punctate calcified atherosclerosis at the skull base. No suspicious intracranial vascular hyperdensity. ORBITS: No acute orbital injury identified. SINUSES: Paranasal sinuses, tympanic cavities  and mastoids remain well aerated. SOFT TISSUES AND SKULL: No acute scalp soft tissue injury identified. No skull fracture. IMPRESSION: 1. No acute intracranial abnormality or acute traumatic injury identified. Port 2. Stable chronic white matter disease, small chronic dystrophic calcification in the left cerebellum. Electronically signed by: Helayne Hurst MD 03/19/2024 04:51 AM EST RP Workstation: [id number]   DG MINI C-ARM IMAGE ONLY Result Date: 03/15/2024 There is no interpretation for this exam.  This order is for images obtained during a surgical procedure.  Please See Surgeries Tab for more information regarding the procedure.   XR Hand Complete Right Result Date: 03/14/2024 X-rays of the right hand oblique fracture extending through the base of the right first metacarpal with lateral displacement  DG Hand Complete Right Result Date: 03/04/2024 CLINICAL DATA:  Initial evaluation for acute trauma, fall. EXAM: RIGHT HAND - COMPLETE 3+ VIEW COMPARISON:  Comparison made with concomitant radiograph of the right wrist performed at the same time. FINDINGS: Acute oblique fracture extending through the base of the right first metacarpal with lateral displacement and overlap. Swelling seen within the overlying soft tissues of the radial aspect of the right hand and wrist. No other acute osseous abnormality. Moderate degenerative osteoarthritic changes present throughout the visualized hand and wrist. IMPRESSION: Acute oblique fracture extending through the base of the right first metacarpal with lateral displacement and overlap. Electronically Signed   By: Morene Hoard M.D.   On: 03/04/2024 15:44   DG Wrist Complete Right Result Date: 03/04/2024 CLINICAL DATA:  Initial evaluation for acute trauma, fall. EXAM: RIGHT WRIST - COMPLETE 3+ VIEW COMPARISON:  None Available. FINDINGS: Acute  oblique fracture extending through the base of the right first metacarpal with lateral displacement and overlap.  Swelling seen within the overlying soft tissues of the radial aspect of the right hand and wrist. No other acute osseous abnormality. Degenerative osteoarthritic changes noted throughout the visualized hand. Few scattered vascular calcifications noted about the wrist. IMPRESSION: Acute oblique fracture extending through the base of the right first metacarpal with lateral displacement and overlap. Electronically Signed   By: Morene Hoard M.D.   On: 03/04/2024 15:43     EKG: EKG Interpretation Date/Time:  Thursday March 28 2024 20:09:11 EST Ventricular Rate:  82 PR Interval:  150 QRS Duration:  78 QT Interval:  416 QTC Calculation: 486 R Axis:   30  Text Interpretation: Normal sinus rhythm Confirmed by Bernard Drivers ([id number]) on 03/28/2024 10:33:02 PM  Radiology: CT Head Wo Contrast Result Date: 03/28/2024 EXAM: CT HEAD WITHOUT CONTRAST 03/28/2024 10:33:35 PM TECHNIQUE: CT of the head was performed without the administration of intravenous contrast. Automated exposure control, iterative reconstruction, and/or weight based adjustment of the mA/kV was utilized to reduce the radiation dose to as low as reasonably achievable. COMPARISON: CT head 03/19/2024. CLINICAL HISTORY: Mental status change, unknown cause. FINDINGS: BRAIN AND VENTRICLES: No acute hemorrhage. No evidence of acute infarct. Patchy and confluent areas of decreased attenuation are noted throughout the deep and periventricular white matter of the cerebral hemispheres bilaterally suggestive of chronic microvascular ischemic changes. Cerebral ventricle sizes are concordant with the degree of cerebral volume loss. No extra-axial collection. No mass effect or midline shift. ORBITS: No acute abnormality. Bilateral lens replacement. SINUSES: No acute abnormality. SOFT TISSUES AND SKULL: No acute soft tissue abnormality. No skull fracture. IMPRESSION: 1. No acute intracranial abnormality. Electronically signed by: Morgane Naveau MD  03/28/2024 10:44 PM EST RP Workstation: [id number]   DG Chest 2 View Result Date: 03/28/2024 EXAM: 2 VIEW(S) XRAY OF THE CHEST 03/28/2024 08:36:53 PM COMPARISON: CXR 01/18/2024, CT Chest 03/19/2024. CLINICAL HISTORY: Rule out pneumonia. FINDINGS: LUNGS AND PLEURA: No focal pulmonary opacity. No pleural effusion. No pneumothorax. HEART AND MEDIASTINUM: No acute abnormality of the cardiac and mediastinal silhouettes. BONES AND SOFT TISSUES: Multiple old healed bilateral rib fractures. IMPRESSION: 1. No acute findings. Electronically signed by: Morgane Naveau MD 03/28/2024 08:48 PM EST RP Workstation: [id number]     Procedures   Medications Ordered in the ED  lactated ringers  bolus 1,000 mL (has no administration in time range)  acetaminophen  (TYLENOL ) tablet 650 mg (has no administration in time range)  cefTRIAXone  (ROCEPHIN ) 1 g in sodium chloride  0.9 % 100 mL IVPB (1 g Intravenous New Bag/Given 03/28/24 2300)                                    Medical Decision Making Problems Addressed: Acute UTI: acute illness or injury with systemic symptoms that poses a threat to life or bodily functions AKI (acute kidney injury): acute illness or injury Confusion: acute illness or injury with systemic symptoms that poses a threat to life or bodily functions Dehydration: acute illness or injury with systemic symptoms that poses a threat to life or bodily functions Marijuana use: acute illness or injury  Amount and/or Complexity of Data Reviewed Independent Historian: EMS External Data Reviewed: notes. Labs: ordered. Decision-making details documented in ED Course. Radiology: ordered and independent interpretation performed. Decision-making details documented in ED Course. ECG/medicine tests: ordered and independent interpretation performed. Decision-making details documented in  ED Course.  Risk OTC drugs. Prescription drug management. Decision regarding hospitalization.   Iv ns. Continuous  pulse ox and cardiac monitoring. Labs ordered/sent. Imaging ordered.   Differential diagnosis includes confusion, effect of meds and/or substance use. Dispo decision including potential need for admission considered - will get labs and imaging and reassess.   Reviewed nursing notes and prior charts for additional history. External reports reviewed. Additional history from: EMS.   Cardiac monitor: sinus rhythm, rate 80.  Labs reviewed/interpreted by me - glucose normal. Mild aki, other chem largely unremarkable. Wbc 13, hgb 12. Possible uti on UA. Rocephin  iv. LR bolus. Ammonia normal.   Xrays reviewed/interpreted by me - no pna.   CT reviewed/interpreted by me - no hem.   Po fluids/food. Ambulate in hall.   Pt currently appears stable for ED d/c.   Rec close pcp f/u.  Return precautions provided.       Final diagnoses:  Confusion  AKI (acute kidney injury)  Acute UTI  Dehydration    ED Discharge Orders     None           Bernard Drivers, MD 03/28/24 2339  "

## 2024-03-29 ENCOUNTER — Other Ambulatory Visit (HOSPITAL_COMMUNITY): Payer: Self-pay

## 2024-03-29 MED ORDER — CEPHALEXIN 500 MG PO CAPS
500.0000 mg | ORAL_CAPSULE | Freq: Three times a day (TID) | ORAL | 0 refills | Status: AC
  Filled 2024-03-29: qty 20, 7d supply, fill #0

## 2024-03-29 MED ORDER — CEPHALEXIN 250 MG PO CAPS
500.0000 mg | ORAL_CAPSULE | Freq: Two times a day (BID) | ORAL | Status: DC
  Administered 2024-03-29: 500 mg via ORAL
  Filled 2024-03-29: qty 2

## 2024-03-29 MED ORDER — OXYCODONE-ACETAMINOPHEN 5-325 MG PO TABS
1.0000 | ORAL_TABLET | Freq: Three times a day (TID) | ORAL | Status: DC | PRN
  Administered 2024-03-29 (×2): 1 via ORAL
  Filled 2024-03-29 (×2): qty 1
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
## 2024-03-29 NOTE — ED Notes (Addendum)
 Took pt to restroom in wheelchair. Water  provided

## 2024-03-29 NOTE — ED Notes (Signed)
 CAregiver requesting that Keflex  scrip be filled TOC on DC.

## 2024-03-29 NOTE — ED Notes (Signed)
 Patient made aware she is waiting on her ride however patient isn 't sure who will be picking her up.

## 2024-03-29 NOTE — ED Notes (Signed)
Pt given ginger ale and crackers, tolerated well.  

## 2024-03-29 NOTE — ED Notes (Signed)
 Pt cleaned of urinary incontinence. Changed into patient gown, clean and dry brief. Bed bath given. Linens changed. Tolerated well.
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
## 2024-03-29 NOTE — ED Notes (Signed)
Patient transported home by [facility] 

## 2024-03-29 NOTE — ED Notes (Signed)
 [facility] arrived to transport patient home. Called [name] to verify someone is at patient's residence to receive her home, no one can be there until 2130. [facility] will come back to transport patient home when caregiver agency has someone at patient's residence to receive her.

## 2024-03-29 NOTE — ED Notes (Addendum)
 Pt ambulated in hallway, unsteady gate noted, c/o dizziness when standing. Spoke with [name] who is production designer, theatre/television/film for pt home care and she states caregiver cannot arrive to pick pt up until 9am in the morning. Pt had difficulty unlocking her phone to call for her caregiver, this RN called with work phone for pt. Bero MD and charge RN aware.

## 2024-04-02 ENCOUNTER — Other Ambulatory Visit (HOSPITAL_COMMUNITY): Payer: Self-pay | Admitting: Medical

## 2024-04-04 ENCOUNTER — Other Ambulatory Visit (HOSPITAL_COMMUNITY): Payer: Self-pay | Admitting: Medical

## 2024-04-04 ENCOUNTER — Ambulatory Visit

## 2024-04-08 ENCOUNTER — Encounter: Admitting: Orthopedic Surgery

## 2024-05-09 ENCOUNTER — Ambulatory Visit

## 2024-06-06 ENCOUNTER — Telehealth (HOSPITAL_COMMUNITY): Admitting: Medical
